# Patient Record
Sex: Female | Born: 1950 | Race: White | Hispanic: No | Marital: Married | Smoking: Former smoker
Health system: Southern US, Community
[De-identification: ages and names within clinical notes are randomized; demographics above are authoritative.]

## PROBLEM LIST (undated history)

## (undated) DIAGNOSIS — Z95810 Presence of automatic (implantable) cardiac defibrillator: Secondary | ICD-10-CM

## (undated) DIAGNOSIS — I714 Abdominal aortic aneurysm, without rupture, unspecified: Secondary | ICD-10-CM

## (undated) DIAGNOSIS — H9190 Unspecified hearing loss, unspecified ear: Secondary | ICD-10-CM

## (undated) DIAGNOSIS — R42 Dizziness and giddiness: Secondary | ICD-10-CM

## (undated) DIAGNOSIS — F0390 Unspecified dementia without behavioral disturbance: Secondary | ICD-10-CM

## (undated) DIAGNOSIS — M1990 Unspecified osteoarthritis, unspecified site: Secondary | ICD-10-CM

## (undated) DIAGNOSIS — I5020 Unspecified systolic (congestive) heart failure: Secondary | ICD-10-CM

## (undated) DIAGNOSIS — I209 Angina pectoris, unspecified: Secondary | ICD-10-CM

## (undated) DIAGNOSIS — R002 Palpitations: Secondary | ICD-10-CM

## (undated) DIAGNOSIS — IMO0001 Reserved for inherently not codable concepts without codable children: Secondary | ICD-10-CM

## (undated) DIAGNOSIS — I428 Other cardiomyopathies: Secondary | ICD-10-CM

## (undated) DIAGNOSIS — I499 Cardiac arrhythmia, unspecified: Secondary | ICD-10-CM

## (undated) DIAGNOSIS — T148XXA Other injury of unspecified body region, initial encounter: Secondary | ICD-10-CM

## (undated) DIAGNOSIS — F419 Anxiety disorder, unspecified: Secondary | ICD-10-CM

## (undated) DIAGNOSIS — R062 Wheezing: Secondary | ICD-10-CM

## (undated) DIAGNOSIS — Z9981 Dependence on supplemental oxygen: Secondary | ICD-10-CM

## (undated) DIAGNOSIS — E039 Hypothyroidism, unspecified: Secondary | ICD-10-CM

## (undated) DIAGNOSIS — F329 Major depressive disorder, single episode, unspecified: Secondary | ICD-10-CM

## (undated) DIAGNOSIS — G4730 Sleep apnea, unspecified: Secondary | ICD-10-CM

## (undated) DIAGNOSIS — I10 Essential (primary) hypertension: Secondary | ICD-10-CM

## (undated) DIAGNOSIS — K219 Gastro-esophageal reflux disease without esophagitis: Secondary | ICD-10-CM

## (undated) DIAGNOSIS — J449 Chronic obstructive pulmonary disease, unspecified: Secondary | ICD-10-CM

## (undated) DIAGNOSIS — F32A Depression, unspecified: Secondary | ICD-10-CM

## (undated) HISTORY — PX: WRIST SURGERY: SHX841

## (undated) HISTORY — PX: CARDIAC CATHETERIZATION: SHX172

## (undated) HISTORY — PX: KNEE SURGERY: SHX244

## (undated) HISTORY — PX: COLON SURGERY: SHX602

## (undated) HISTORY — DX: Unspecified hearing loss, unspecified ear: H91.90

## (undated) HISTORY — PX: BLADDER SURGERY: SHX569

## (undated) HISTORY — DX: Essential (primary) hypertension: I10

## (undated) HISTORY — DX: Presence of automatic (implantable) cardiac defibrillator: Z95.810

## (undated) HISTORY — DX: Unspecified dementia, unspecified severity, without behavioral disturbance, psychotic disturbance, mood disturbance, and anxiety: F03.90

## (undated) HISTORY — PX: COLONOSCOPY: SHX174

## (undated) HISTORY — PX: ABDOMINAL HYSTERECTOMY: SHX81

## (undated) HISTORY — DX: Abdominal aortic aneurysm, without rupture: I71.4

## (undated) HISTORY — DX: Chronic obstructive pulmonary disease, unspecified: J44.9

## (undated) HISTORY — PX: CHOLECYSTECTOMY: SHX55

## (undated) HISTORY — DX: Abdominal aortic aneurysm, without rupture, unspecified: I71.40

## (undated) HISTORY — PX: CARDIAC DEFIBRILLATOR PLACEMENT: SHX171

## (undated) HISTORY — DX: Other cardiomyopathies: I42.8

## (undated) HISTORY — DX: Unspecified systolic (congestive) heart failure: I50.20

## (undated) HISTORY — PX: MIDDLE EAR SURGERY: SHX713

---

## 1998-10-25 ENCOUNTER — Other Ambulatory Visit: Admission: RE | Admit: 1998-10-25 | Discharge: 1998-10-25 | Payer: Self-pay | Admitting: *Deleted

## 1999-07-05 ENCOUNTER — Encounter: Payer: Self-pay | Admitting: *Deleted

## 1999-07-05 ENCOUNTER — Encounter: Admission: RE | Admit: 1999-07-05 | Discharge: 1999-07-05 | Payer: Self-pay | Admitting: *Deleted

## 1999-09-28 ENCOUNTER — Encounter: Payer: Self-pay | Admitting: Urology

## 1999-10-03 ENCOUNTER — Observation Stay (HOSPITAL_COMMUNITY): Admission: RE | Admit: 1999-10-03 | Discharge: 1999-10-04 | Payer: Self-pay | Admitting: Urology

## 2000-03-21 ENCOUNTER — Ambulatory Visit (HOSPITAL_COMMUNITY): Admission: RE | Admit: 2000-03-21 | Discharge: 2000-03-21 | Payer: Self-pay | Admitting: Specialist

## 2000-03-21 ENCOUNTER — Encounter: Payer: Self-pay | Admitting: Specialist

## 2000-03-25 ENCOUNTER — Encounter: Payer: Self-pay | Admitting: Specialist

## 2000-03-25 ENCOUNTER — Ambulatory Visit (HOSPITAL_COMMUNITY): Admission: RE | Admit: 2000-03-25 | Discharge: 2000-03-25 | Payer: Self-pay | Admitting: Specialist

## 2000-12-12 ENCOUNTER — Encounter: Payer: Self-pay | Admitting: Specialist

## 2000-12-12 ENCOUNTER — Encounter: Admission: RE | Admit: 2000-12-12 | Discharge: 2000-12-12 | Payer: Self-pay | Admitting: Specialist

## 2001-03-30 ENCOUNTER — Other Ambulatory Visit: Admission: RE | Admit: 2001-03-30 | Discharge: 2001-03-30 | Payer: Self-pay | Admitting: Obstetrics and Gynecology

## 2001-06-01 ENCOUNTER — Encounter (INDEPENDENT_AMBULATORY_CARE_PROVIDER_SITE_OTHER): Payer: Self-pay

## 2001-06-01 ENCOUNTER — Ambulatory Visit (HOSPITAL_COMMUNITY): Admission: RE | Admit: 2001-06-01 | Discharge: 2001-06-01 | Payer: Self-pay | Admitting: Gastroenterology

## 2001-06-01 ENCOUNTER — Encounter: Payer: Self-pay | Admitting: Gastroenterology

## 2001-06-15 ENCOUNTER — Encounter: Payer: Self-pay | Admitting: Gastroenterology

## 2001-06-15 ENCOUNTER — Ambulatory Visit (HOSPITAL_COMMUNITY): Admission: RE | Admit: 2001-06-15 | Discharge: 2001-06-15 | Payer: Self-pay | Admitting: Gastroenterology

## 2001-10-30 ENCOUNTER — Encounter: Admission: RE | Admit: 2001-10-30 | Discharge: 2001-10-30 | Payer: Self-pay

## 2002-06-08 ENCOUNTER — Other Ambulatory Visit: Admission: RE | Admit: 2002-06-08 | Discharge: 2002-06-08 | Payer: Self-pay | Admitting: Obstetrics & Gynecology

## 2002-07-06 ENCOUNTER — Encounter: Payer: Self-pay | Admitting: Orthopedic Surgery

## 2002-07-06 ENCOUNTER — Ambulatory Visit (HOSPITAL_COMMUNITY): Admission: RE | Admit: 2002-07-06 | Discharge: 2002-07-06 | Payer: Self-pay | Admitting: Orthopedic Surgery

## 2002-08-19 ENCOUNTER — Encounter (INDEPENDENT_AMBULATORY_CARE_PROVIDER_SITE_OTHER): Payer: Self-pay | Admitting: *Deleted

## 2002-08-19 ENCOUNTER — Ambulatory Visit (HOSPITAL_BASED_OUTPATIENT_CLINIC_OR_DEPARTMENT_OTHER): Admission: RE | Admit: 2002-08-19 | Discharge: 2002-08-19 | Payer: Self-pay | Admitting: Orthopedic Surgery

## 2003-03-29 ENCOUNTER — Encounter: Admission: RE | Admit: 2003-03-29 | Discharge: 2003-03-29 | Payer: Self-pay | Admitting: Family Medicine

## 2003-05-19 ENCOUNTER — Ambulatory Visit (HOSPITAL_BASED_OUTPATIENT_CLINIC_OR_DEPARTMENT_OTHER): Admission: RE | Admit: 2003-05-19 | Discharge: 2003-05-19 | Payer: Self-pay | Admitting: Orthopedic Surgery

## 2003-05-19 ENCOUNTER — Ambulatory Visit (HOSPITAL_COMMUNITY): Admission: RE | Admit: 2003-05-19 | Discharge: 2003-05-19 | Payer: Self-pay | Admitting: Orthopedic Surgery

## 2003-05-19 ENCOUNTER — Encounter (INDEPENDENT_AMBULATORY_CARE_PROVIDER_SITE_OTHER): Payer: Self-pay | Admitting: *Deleted

## 2005-03-08 ENCOUNTER — Ambulatory Visit: Payer: Self-pay | Admitting: Internal Medicine

## 2005-03-08 ENCOUNTER — Inpatient Hospital Stay (HOSPITAL_COMMUNITY): Admission: EM | Admit: 2005-03-08 | Discharge: 2005-03-15 | Payer: Self-pay | Admitting: Emergency Medicine

## 2005-03-08 ENCOUNTER — Ambulatory Visit: Payer: Self-pay | Admitting: Cardiovascular Disease

## 2005-03-12 ENCOUNTER — Encounter: Payer: Self-pay | Admitting: Internal Medicine

## 2005-04-25 ENCOUNTER — Emergency Department (HOSPITAL_COMMUNITY): Admission: EM | Admit: 2005-04-25 | Discharge: 2005-04-26 | Payer: Self-pay | Admitting: Emergency Medicine

## 2006-02-01 ENCOUNTER — Emergency Department: Payer: Self-pay | Admitting: Emergency Medicine

## 2006-02-12 ENCOUNTER — Ambulatory Visit: Payer: Self-pay | Admitting: Unknown Physician Specialty

## 2006-03-03 ENCOUNTER — Encounter: Admission: RE | Admit: 2006-03-03 | Discharge: 2006-03-03 | Payer: Self-pay | Admitting: Family Medicine

## 2007-05-26 ENCOUNTER — Ambulatory Visit: Payer: Self-pay | Admitting: Gastroenterology

## 2007-06-09 ENCOUNTER — Ambulatory Visit: Payer: Self-pay | Admitting: Gastroenterology

## 2007-06-09 ENCOUNTER — Encounter: Payer: Self-pay | Admitting: Gastroenterology

## 2008-11-24 ENCOUNTER — Inpatient Hospital Stay: Payer: Self-pay | Admitting: Student

## 2008-12-07 LAB — CONVERTED CEMR LAB
AST: 32 units/L
CO2: 22 meq/L
Calcium: 9.3 mg/dL
Chloride: 96 meq/L
Creatinine, Ser: 1.5 mg/dL
Sodium: 135 meq/L

## 2009-03-09 ENCOUNTER — Encounter: Payer: Self-pay | Admitting: Cardiovascular Disease

## 2009-03-09 LAB — CONVERTED CEMR LAB
BUN: 14 mg/dL
CO2: 27 meq/L
Chloride: 99 meq/L
Creatinine, Ser: 0.82 mg/dL
Potassium: 4.2 meq/L

## 2009-03-23 ENCOUNTER — Encounter: Payer: Self-pay | Admitting: Cardiovascular Disease

## 2009-03-23 ENCOUNTER — Ambulatory Visit: Payer: Self-pay | Admitting: Internal Medicine

## 2009-03-23 DIAGNOSIS — I5022 Chronic systolic (congestive) heart failure: Secondary | ICD-10-CM | POA: Insufficient documentation

## 2009-03-23 DIAGNOSIS — R002 Palpitations: Secondary | ICD-10-CM

## 2009-03-23 DIAGNOSIS — I5042 Chronic combined systolic (congestive) and diastolic (congestive) heart failure: Secondary | ICD-10-CM

## 2009-03-23 DIAGNOSIS — I420 Dilated cardiomyopathy: Secondary | ICD-10-CM | POA: Insufficient documentation

## 2009-03-23 DIAGNOSIS — R05 Cough: Secondary | ICD-10-CM

## 2009-03-23 DIAGNOSIS — R011 Cardiac murmur, unspecified: Secondary | ICD-10-CM

## 2009-03-23 DIAGNOSIS — G4733 Obstructive sleep apnea (adult) (pediatric): Secondary | ICD-10-CM

## 2009-03-24 DIAGNOSIS — I10 Essential (primary) hypertension: Secondary | ICD-10-CM | POA: Insufficient documentation

## 2009-04-11 ENCOUNTER — Telehealth: Payer: Self-pay | Admitting: Internal Medicine

## 2009-04-12 ENCOUNTER — Ambulatory Visit: Payer: Self-pay | Admitting: Internal Medicine

## 2009-04-12 ENCOUNTER — Inpatient Hospital Stay (HOSPITAL_COMMUNITY): Admission: RE | Admit: 2009-04-12 | Discharge: 2009-04-13 | Payer: Self-pay | Admitting: Internal Medicine

## 2009-04-13 ENCOUNTER — Encounter: Payer: Self-pay | Admitting: Internal Medicine

## 2009-04-15 ENCOUNTER — Inpatient Hospital Stay (HOSPITAL_COMMUNITY)
Admission: EM | Admit: 2009-04-15 | Discharge: 2009-04-17 | Payer: Self-pay | Source: Home / Self Care | Admitting: Emergency Medicine

## 2009-04-15 ENCOUNTER — Telehealth (INDEPENDENT_AMBULATORY_CARE_PROVIDER_SITE_OTHER): Payer: Self-pay | Admitting: Physician Assistant

## 2009-04-15 ENCOUNTER — Ambulatory Visit: Payer: Self-pay | Admitting: Internal Medicine

## 2009-04-18 ENCOUNTER — Encounter: Payer: Self-pay | Admitting: Internal Medicine

## 2009-04-20 ENCOUNTER — Ambulatory Visit: Payer: Self-pay | Admitting: Cardiovascular Disease

## 2009-04-27 ENCOUNTER — Ambulatory Visit: Payer: Self-pay

## 2009-04-27 ENCOUNTER — Encounter: Payer: Self-pay | Admitting: Internal Medicine

## 2009-05-19 ENCOUNTER — Emergency Department: Payer: Self-pay | Admitting: Emergency Medicine

## 2009-06-12 ENCOUNTER — Telehealth: Payer: Self-pay | Admitting: Nurse Practitioner

## 2009-06-13 ENCOUNTER — Ambulatory Visit (HOSPITAL_COMMUNITY): Admission: RE | Admit: 2009-06-13 | Discharge: 2009-06-13 | Payer: Self-pay | Admitting: Internal Medicine

## 2009-06-13 ENCOUNTER — Ambulatory Visit: Payer: Self-pay | Admitting: Internal Medicine

## 2009-07-18 ENCOUNTER — Encounter: Payer: Self-pay | Admitting: Cardiovascular Disease

## 2009-07-18 ENCOUNTER — Ambulatory Visit: Payer: Self-pay | Admitting: Internal Medicine

## 2009-07-20 LAB — CONVERTED CEMR LAB
BUN: 15 mg/dL (ref 6–23)
CO2: 26 meq/L (ref 19–32)
Calcium: 9.4 mg/dL (ref 8.4–10.5)
Creatinine, Ser: 0.9 mg/dL (ref 0.40–1.20)
Glucose, Bld: 134 mg/dL — ABNORMAL HIGH (ref 70–99)
Pro B Natriuretic peptide (BNP): 4.6 pg/mL (ref 0.0–100.0)

## 2009-07-21 ENCOUNTER — Telehealth: Payer: Self-pay | Admitting: Internal Medicine

## 2009-08-16 ENCOUNTER — Inpatient Hospital Stay: Payer: Self-pay | Admitting: Internal Medicine

## 2009-08-30 ENCOUNTER — Ambulatory Visit: Payer: Self-pay | Admitting: Cardiovascular Disease

## 2009-09-05 ENCOUNTER — Emergency Department: Payer: Self-pay | Admitting: Emergency Medicine

## 2009-09-21 ENCOUNTER — Ambulatory Visit: Payer: Self-pay | Admitting: Internal Medicine

## 2009-09-22 ENCOUNTER — Ambulatory Visit: Payer: Self-pay | Admitting: Internal Medicine

## 2009-10-18 ENCOUNTER — Encounter: Payer: Self-pay | Admitting: Internal Medicine

## 2009-10-19 ENCOUNTER — Ambulatory Visit: Payer: Self-pay | Admitting: Internal Medicine

## 2009-11-16 ENCOUNTER — Ambulatory Visit: Payer: Self-pay

## 2009-11-16 ENCOUNTER — Encounter: Payer: Self-pay | Admitting: Internal Medicine

## 2009-11-29 ENCOUNTER — Emergency Department: Payer: Self-pay | Admitting: Emergency Medicine

## 2010-02-15 ENCOUNTER — Encounter: Payer: Self-pay | Admitting: Internal Medicine

## 2010-02-15 ENCOUNTER — Ambulatory Visit
Admission: RE | Admit: 2010-02-15 | Discharge: 2010-02-15 | Payer: Self-pay | Source: Home / Self Care | Attending: Internal Medicine | Admitting: Internal Medicine

## 2010-02-25 ENCOUNTER — Encounter: Payer: Self-pay | Admitting: Unknown Physician Specialty

## 2010-02-25 ENCOUNTER — Encounter: Payer: Self-pay | Admitting: Family Medicine

## 2010-03-06 NOTE — Progress Notes (Signed)
  Phone Note Call from Patient   Call For: Dr Sherryl Manges Action Taken: Patient advised to go to ER Summary of Call: Pt c/o swelling at ICD site. New, since this am. Some pain and a tingling feeling as well but does not think device has gone off. Advised her to come to the [facility] ER and get checked.  Initial call taken by: Park Breed PA-C,  April 15, 2009 7:40 PM

## 2010-03-06 NOTE — Procedures (Signed)
Summary: device ck/mt   Current Medications (verified): 1)  Aspirin 81 Mg Tbec (Aspirin) .... Take One Tablet By Mouth Daily 2)  Levothroid 100 Mcg Tabs (Levothyroxine Sodium) .Marland Kitchen.. 1 Tab By Mouth Once Daily 3)  Losartan Potassium 50 Mg Tabs (Losartan Potassium) .Marland Kitchen.. 1 Tab By Mouth Daily 4)  Spironolactone 25 Mg Tabs (Spironolactone) .... 1/2 Tab By Mouth Once Daily 5)  Hydrocodone-Acetaminophen 5-500 Mg Tabs (Hydrocodone-Acetaminophen) .Marland Kitchen.. 1 By Mouth Two Times A Day 6)  Coreg 25 Mg Tabs (Carvedilol) .... 1/2 By Mouth Am and 1 By Mouth Pm 7)  Spiriva Handihaler 18 Mcg Caps (Tiotropium Bromide Monohydrate) .... One Puff Daily 8)  Digoxin 0.125 Mg Tabs (Digoxin) .... One By Mouth Daily 9)  Symbicort 160-4.5 Mcg/act Aero (Budesonide-Formoterol Fumarate) .... One Puff Daily 10)  Lasix 40 Mg Tabs (Furosemide) .... One By Mouth Two Times A Day 11)  Ambien 10 Mg Tabs (Zolpidem Tartrate) .... 1/2 By Mouth At Bedtime 12)  Pantoprazole Sodium 40 Mg Tbec (Pantoprazole Sodium) .... One By Mouth Daily 13)  Mirapex 0.25 Mg Tabs (Pramipexole Dihydrochloride) .... One By Mouth Daily At Bedtime 14)  Duoneb 0.5-2.5 (3) Mg/1ml Soln (Ipratropium-Albuterol) .... Every 4 Hours As Needed 15)  Proventil Hfa 108 (90 Base) Mcg/act Aers (Albuterol Sulfate) .... 2 Puffs As Needed Every 4 Hours 16)  Alprazolam 0.25 Mg Tabs (Alprazolam) .... Take As Needed 17)  Metoclopramide Hcl 5 Mg Tabs (Metoclopramide Hcl) .... Take 1 Tablet By Mouth Once Daily 18)  Venlafaxine Hcl 37.5 Mg Tabs (Venlafaxine Hcl) .... Take 1 Tablet By Mouth Once Daily  Allergies (verified): No Known Drug Allergies   ICD Specifications Following MD:  Sherryl Manges, MD     ICD Vendor:  [employer] Jude     ICD Model Number:  [phone]     ICD Serial Number:  [id number] ICD DOI:  04/12/2009     ICD Implanting MD:  Sherryl Manges, MD  Lead 1:    Location: RA     DOI: 04/12/2009     Model #: [id number]     Serial #: [id number]     Status: active Lead 2:    Location: RV      DOI: 04/12/2009     Model #: 7121     Serial #: [id number]     Status: active Lead 3:    Location: LV     DOI: 04/12/2009     Model #: 1258T     Serial #: [id number]     Status: active  Indications::  CM   ICD Follow Up Battery Voltage:  89% V     Charge Time:  8.5 seconds     Battery Est. Longevity:  6.0 yrs Underlying rhythm:  SR ICD Dependent:  No       ICD Device Measurements Atrium:  Amplitude: 5.0 mV, Impedance: 400 ohms, Threshold: 1.0 V at 0.5 msec Right Ventricle:  Impedance: 540 ohms,  Left Ventricle:  Impedance: 580 ohms,  Configuration: LV RING TO RV COIL Shock Impedance: 48 ohms   Episodes MS Episodes:  2     Percent Mode Switch:  <1%     Shock:  0     ATP:  0     Nonsustained:  0     Atrial Therapies:  0 Atrial Pacing:  5.4%     Ventricular Pacing:  >99%  Brady Parameters Mode DDD     Lower Rate Limit:  60     Upper Rate Limit 120 PAV  190     Sensed AV Delay:  140  Tachy Zones VF:  240     VT:  200     Next Remote Date:  02/15/2010     Next Cardiology Appt Due:  07/06/2010 Tech Comments:  PT IN FOR FOLLOWUP FROM [facility]---CHANGED RA SENSITIVITY FROM AUTO TO 0.52mV DUE TO INCREASED MODE SWITCHES ON [facility] TRANSMISSIION.  NEXT [facility] TRANSMISSION 02-15-10. ROV IN [facility] W/SK. Vella Kohler  November 17, 2009 12:06 PM   Patient Instructions: 1)  [facility] transmission on 02-15-2010

## 2010-03-06 NOTE — Progress Notes (Signed)
Summary: LAB RESULTS  Phone Note Call from Patient Call back at [phone]   Caller: HUSBAND (DALE) Call For: [facility] Summary of Call: WOULD LIKE RECENT LAB RESULTS Initial call taken by: Harlon Flor,  July 21, 2009 10:02 AM  Follow-up for Phone Call        called and spoke to pt 6/16 husband said that she did not remember what i had told her yesterday.  so went through the recommendations with him also. Follow-up by: Benedict Needy, RN,  July 21, 2009 2:15 PM

## 2010-03-06 NOTE — Assessment & Plan Note (Signed)
Summary: NEP/AMD   Visit Type:  New Patient EP Referring Provider:  Dr Kirke Corin Primary Provider:  Lonie Peak, MD   History of Present Illness: Stacy Grimes is seen at the request of Dr. Kirke Corin for consideration of ICD implantation.  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 She was found to have an ejection fraction of 10-15% and catheterization demonstrated no significant obstructive disease. She has been treated for the last number of months with beta blockers ACE inhibitors and Aldactone with more recent assessment of her ejection fraction last 2 weeks of 23%. She continues to have significant symptoms. She is short of breath at less than 100 yards. She cannot climb stairs. She has some nocturnal dyspnea with orthopnea. She has mild peripheral edema.  She has had no syncope; she has had tachycardia palpitations as well as irregularities that sound like simple skips  She also has a problem with a cough that is mostly nonproductive.  Preventive Screening-Counseling & Management  Alcohol-Tobacco     Alcohol drinks/day: 0     Smoking Status: quit  Caffeine-Diet-Exercise     Caffeine use/day: no     Does Patient Exercise: no  Current Medications (verified): 1)  Prilosec Otc 20 Mg Tbec (Omeprazole Magnesium) .Marland Kitchen.. 1 By Mouth Once Daily 2)  Advair Diskus 250-50 Mcg/dose Aepb (Fluticasone-Salmeterol) .... Two Times A Day 3)  Aspirin 81 Mg Tbec (Aspirin) .... Take One Tablet By Mouth Daily 4)  Carvedilol 6.25 Mg Tabs (Carvedilol) .... Take One Tablet By Mouth Twice A Day 5)  Citalopram Hydrobromide 40 Mg Tabs (Citalopram Hydrobromide) .Marland Kitchen.. 1 Tab By Mouth Once Daily 6)  Furosemide 40 Mg Tabs (Furosemide) .Marland Kitchen.. 1 Tab By Mouth Two Times A Day 7)  Levothroid 125 Mcg Tabs (Levothyroxine Sodium) .Marland Kitchen.. 1 Tab By Mouth Once Daily 8)  Lisinopril 10 Mg Tabs (Lisinopril) .... Take 1/2 Tablet By Mouth Daily 9)   Spironolactone 25 Mg Tabs (Spironolactone) .... 1/2 Tab By Mouth Once Daily 10)  Trazodone Hcl 50 Mg Tabs (Trazodone Hcl) .Marland Kitchen.. 1 Tab By Mouth Once Daily At Bedtime 11)  Pramipexole Dihydrochloride 0.125 Mg Tabs (Pramipexole Dihydrochloride) .Marland Kitchen.. 1 By Mouth Once Daily 12)  Hydrocodone-Acetaminophen 5-500 Mg Tabs (Hydrocodone-Acetaminophen) .Marland Kitchen.. 1 By Mouth Two Times A Day 13)  Ipratropium-Albuterol 0.5-2.5 (3) Mg/74ml Soln (Ipratropium-Albuterol) .... As Needed  Allergies (verified): No Known Drug Allergies  Past History:  Family History: Last updated: 2009/03/26 Mother: deceased: HTN, STROKE, Breast cancer. Father: deceased: heart disease Sister (both) heart attach Brother: HTN  Social History: Last updated: 03/13/2009 Tobacco Use - Former. Quit 7 years ago. Alcohol Use - no  Risk Factors: Alcohol Use: 0 (03/26/09) Caffeine Use: no (March 26, 2009) Exercise: no (Mar 26, 2009)  Risk Factors: Smoking Status: quit (2009-03-26)  Past Medical History: No coronary artery disease cardiac cath 10/10: only mid CAD. EF 15%. Cardiomyopathy non-ischemic Dilated Cardiomyopathy. CHF Systolic congestive heart failure. Chronic systolic congestive heart failure. Essential hypertension. 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 Father: deceased: heart disease Sister (both) heart attach Brother: HTN  Social History: Alcohol drinks/day:  0 Caffeine use/day:  no Does Patient Exercise:  no  Review of Systems       full review of systems was negative apart from a history of present illness and past medical history.  Vital Signs:  Patient profile:   60 year old female Height:      63 inches Weight:      216.50 pounds BMI:     38.49 Pulse rate:   77 / minute Pulse rhythm:   regular BP sitting:   100 / 60  (right  arm) Cuff size:   large  Vitals Entered By: Mercer Pod (March 23, 2009 1:42 PM)  Physical Exam  General:  Alert and oriented older Caucasian female appearing considerably older than her stated age of 60 in mild respiratory distress ; she is wearing oxygen HEENT  normal . Neck veins were 8-9 centimeterscarotids brisk and full without bruits. No lymphadenopathy. Back without kyphosis. Lungs clear. Heart sounds regular without murmurs or gallops. PMI nondisplaced. Abdomen soft and protuberantwith active bowel sounds without midline pulsation or hepatomegaly. Femoral pulses and distal pulses intact. Extremities were without clubbing cyanosis or edemaSkin warm and dry. Neurological exam grossly normal; affect is sad     Problems:  Medical Problems Added: 1)  Dx of Murmur  (ICD-785.2) 2)  Dx of Hypertension, Benign  (ICD-401.1) 3)  Dx of Cough  (ICD-786.2) 4)  Dx of Palpitations  (ICD-785.1) 5)  Dx of Sleep Apnea, Obstructive  (ICD-327.23) 6)  Dx of Systolic Heart Failure, Chronic  (ICD-428.22) 7)  Dx of Cardiomyopathy, Primary, Dilated  (ICD-425.4)  EKG  Procedure date:  03/23/2009  Findings:      Sinus rhythm at 77 Intervals 0.17/0.17/0.44 Axis is 60 Left bundle-branch block  Echocardiogram  Procedure date:  03/09/2009  Findings:      Mitral regurgitation Mildly dilated left atrium and moderately dilated LV with rest of the chambers of normal size.  Severe LV systolic dysfunction EF 20-25%. Global hypokinesis. Mild left ventrucular hypertrophy Mild tricuspid regurgitation. Normal pulmonary artery systolic pressure. Mild mitral regurgitation.   Impression & Recommendations:  Problem # 1:  SYSTOLIC HEART FAILURE, CHRONIC (ICD-428.22) the patient has persistent symptoms of class III heart failure despite maximal medical therapy which her blood pressure will allow. Cardiac resynchronization therapy is indicated with persistent symptoms now greater than 3 months.   we have discussed the super benefit noted in women with nonischemic cardiac myopathy. We did discuss potential risks of the procedure including but not limited to death perforation lead dislodgment infection and device malfunction.  In addition, with her LV dysfunction is appropriate to consider ICD implantation at the same time. We have discussed extensively the risk of cardiac death Her updated medication list for this problem includes:    Aspirin 81 Mg Tbec (Aspirin) .Marland Kitchen... Take one tablet by mouth daily    Carvedilol 6.25 Mg Tabs (Carvedilol) .Marland Kitchen... Take one tablet by mouth twice a day    Furosemide 40 Mg Tabs (Furosemide) .Marland Kitchen... 1 tab by mouth two times a day    Losartan Potassium 50 Mg Tabs (Losartan potassium) .Marland Kitchen... 1 tab by mouth daily    Spironolactone 25 Mg Tabs (Spironolactone) .Marland Kitchen... 1/2 tab by mouth once daily  Problem # 2:  PALPITATIONS (ICD-785.1) Mechanism her palpitations is not clear and ultimately will become evidentvia  her device Her updated medication list for this problem includes:    Aspirin 81 Mg Tbec (Aspirin) .Marland Kitchen... Take one tablet by mouth daily    Carvedilol 6.25 Mg Tabs (Carvedilol) .Marland Kitchen... Take one tablet by mouth twice a day  Problem # 3:  CARDIOMYOPATHY, PRIMARY, DILATED (ICD-425.4) as above Her updated medication list for this problem includes:    Aspirin 81 Mg Tbec (  Aspirin) .Marland Kitchen... Take one tablet by mouth daily    Carvedilol 6.25 Mg Tabs (Carvedilol) .Marland Kitchen... Take one tablet by mouth twice a day    Furosemide 40 Mg Tabs (Furosemide) .Marland Kitchen... 1 tab by mouth two times a day    Losartan Potassium 50 Mg Tabs (Losartan potassium) .Marland Kitchen... 1 tab by mouth daily    Spironolactone 25 Mg Tabs (Spironolactone) .Marland Kitchen... 1/2 tab by mouth once daily  Problem # 4:  COUGH (ICD-786.2) Her cough may be related to her ACE inhibitor.we'll plan to discontinue it and put her on losartan 50 mg Her updated medication list for this problem includes:    Aspirin 81 Mg Tbec (Aspirin) .Marland Kitchen... Take one  tablet by mouth daily    Carvedilol 6.25 Mg Tabs (Carvedilol) .Marland Kitchen... Take one tablet by mouth twice a day  Patient Instructions: 1)  Your physician has recommended you make the following change in your medication: stop lisinopril. start losartan 50 mg daily 2)  Your physician has recommended that you have a defibrillator inserted.  An implantable cardioverter defibrillator (ICD) is a small device that is placed in your chest or, in rare cases, your abdomen. This device uses electrical pulses or shocks to help control life-threatening, irregular heartbeats that could lead the heart to suddenly stop beating (sudden cardiac arrest). Leads are attached to the ICD that goes into your heart. This is done in the hospital and usually requires an overnight stay. Please see the instruction sheet given to you today for more information. Prescriptions: LOSARTAN POTASSIUM 50 MG TABS (LOSARTAN POTASSIUM) 1 tab by mouth daily  #30 x 6   Entered by:   Charlena Cross, RN, BSN   Authorized by:   Nathen May, MD, [facility]   Signed by:   Mercer Pod on 03/24/2009   Method used:   Print then Give to Patient   RxID:   [id number]    -  Date:  03/09/2009    BG Random: 88    BUN: 14    Creatinine: 0.82    Sodium: 139    Potassium: 4.2    Chloride: 99    CO2 Total: 27    Calcium: 9.0    BNP: 54.7  Date:  12/07/2008    BG Random: 94    BUN: 24    Creatinine: 1.5    Sodium: 135    Potassium: 4.6    Chloride: 96    CO2 Total: 22    SGOT (AST): 32    SGPT (ALT): 47    T. Bilirubin: 0.6    Calcium: 9.3    BNP: 101.9  Appended Document: NEP/AMD    Clinical Lists Changes  Orders: Added new Referral order of Bi-V ICD (Bi-V ICD) - Signed

## 2010-03-06 NOTE — Assessment & Plan Note (Signed)
Summary: F3M/AMD   Visit Type:  Follow-up  CC:  no cardiac complaints.  Allergies (verified): No Known Drug Allergies  Vital Signs:  Patient profile:   60 year old female Height:      63 inches Weight:      220 pounds BMI:     39.11  Vitals Entered By: Bishop Dublin, CMA (July 18, 2009 1:24 PM)    ICD Specifications Following MD:  Sherryl Manges, MD     ICD Vendor:  [employer] Jude     ICD Model Number:  [id number]     ICD Serial Number:  [id number] ICD DOI:  04/12/2009     ICD Implanting MD:  Sherryl Manges, MD  Lead 1:    Location: RA     DOI: 04/12/2009     Model #: [id number]     Serial #: [id number]     Status: active Lead 2:    Location: RV     DOI: 04/12/2009     Model #: 7121     Serial #: [id number]     Status: active Lead 3:    Location: LV     DOI: 04/12/2009     Model #: 1258T     Serial #: [id number]     Status: active  Indications::  CM   ICD Follow Up ICD Dependent:  No       ICD Device Measurements Configuration: LV RING TO RV COIL  Brady Parameters Mode DDD     Lower Rate Limit:  60     Upper Rate Limit 120 PAV 150     Sensed AV Delay:  120  Tachy Zones VF:  240     VT:  200

## 2010-03-06 NOTE — Procedures (Signed)
Summary: Cardiology Device Clinic   Allergies: No Known Drug Allergies    ICD Specifications Following MD:  Sherryl Manges, MD     ICD Vendor:  [employer] Jude     ICD Model Number:  [id number]     ICD Serial Number:  [id number] ICD DOI:  04/12/2009     ICD Implanting MD:  Sherryl Manges, MD  Lead 1:    Location: RA     DOI: 04/12/2009     Model #: [id number]     Serial #: [id number]     Status: active Lead 2:    Location: RV     DOI: 04/12/2009     Model #: 0865     Serial #: [id number]     Status: active Lead 3:    Location: LV     DOI: 04/12/2009     Model #: 1258T     Serial #: [id number]     Status: active  Indications::  CM  Tech Comments:  Patient seen in follow up for hematoma.  Pt with moderate hematoma.  No bruising or redness.  Patient very tender to the touch.  Advised to keep clean and dry and call back with increasing swelling, drainage, or redness.  Patient advised to take pain meds. Pt has follow up with Dr Mariah Milling 3-17 and device clinic 3-24. Gypsy Balsam RN BSN  April 18, 2009 10:00 AM

## 2010-03-06 NOTE — Assessment & Plan Note (Signed)
Summary: NP6/BMET/AMD   Visit Type:  post hospital  Referring Provider:  Dr Kirke Corin Primary Provider:  Lonie Peak, MD  CC:  pacemaker pocker still swollen, some chest pains - sometimes feels like a shock, and no edema or shortness of breath.  History of Present Illness: [name] is a 60 year old woman with nonischemic cardiomyopathy, no significant coronary disease by catheterization in October 2010, ejection fraction 15-20%, with defibrillator placed April 13, 2009, also a long history of COPD and long smoking history presents for evaluation of her pacer pocket site.  she states that over the past week, her pacer pocket site has been very tender. She has been taking pain medications for comfort. She has a little bit of soreness upper neck and a little bit in her left hand and wrist. She denies any significant change to her shortness of breath which is chronic, no cell or significant chest pain other than the discomfort around the pocket site.  She denies any worsening lower extremity edema, she does drink a significant amount of fluids and her husband states that she drinks a gallon a day.  Current Problems (verified): 1)  Murmur  (ICD-785.2) 2)  Hypertension, Benign  (ICD-401.1) 3)  Cough  (ICD-786.2) 4)  Palpitations  (ICD-785.1) 5)  Sleep Apnea, Obstructive  (ICD-327.23) 6)  Systolic Heart Failure, Chronic  (ICD-428.22) 7)  Cardiomyopathy, Primary, Dilated  (ICD-425.4)  Current Medications (verified): 1)  Advair Diskus 250-50 Mcg/dose Aepb (Fluticasone-Salmeterol) .... Two Times A Day 2)  Aspirin 81 Mg Tbec (Aspirin) .... Take One Tablet By Mouth Daily 3)  Carvedilol 12.5 Mg Tabs (Carvedilol) .... Take One Tablet By Mouth Twice A Day 4)  Citalopram Hydrobromide 40 Mg Tabs (Citalopram Hydrobromide) .Marland Kitchen.. 1 Tab By Mouth Once Daily 5)  Furosemide 40 Mg Tabs (Furosemide) .Marland Kitchen.. 1 Tab By Mouth Two Times A Day 6)  Levothroid 125 Mcg Tabs (Levothyroxine Sodium) .Marland Kitchen.. 1 Tab By Mouth Once  Daily 7)  Losartan Potassium 50 Mg Tabs (Losartan Potassium) .Marland Kitchen.. 1 Tab By Mouth Daily 8)  Spironolactone 25 Mg Tabs (Spironolactone) .... 1/2 Tab By Mouth Once Daily 9)  Trazodone Hcl 50 Mg Tabs (Trazodone Hcl) .... 2 Tab By Mouth Once Daily At Bedtime 10)  Pramipexole Dihydrochloride 0.125 Mg Tabs (Pramipexole Dihydrochloride) .Marland Kitchen.. 1 By Mouth Once Daily 11)  Hydrocodone-Acetaminophen 5-500 Mg Tabs (Hydrocodone-Acetaminophen) .Marland Kitchen.. 1 By Mouth Two Times A Day 12)  Ipratropium-Albuterol 0.5-2.5 (3) Mg/30ml Soln (Ipratropium-Albuterol) .... As Needed  Allergies (verified): No Known Drug Allergies  Past History:  Past Medical History: Last updated: 03-26-09 No coronary artery disease cardiac cath 10/10: only mid CAD. EF 15%. Cardiomyopathy non-ischemic Dilated Cardiomyopathy. CHF Systolic congestive heart failure. Chronic systolic congestive heart failure. Essential hypertension. 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 Father: deceased: heart disease Sister (both) heart attach Brother: HTN  Social History: Last updated: 03/13/2009 Tobacco Use - Former. Quit 7 years ago. Alcohol Use - no  Risk Factors: Alcohol Use: 0 (03-26-09) Caffeine Use: no (March 26, 2009) Exercise: no (03/26/2009)  Risk Factors: Smoking Status: quit (2009-03-26)  Review of Systems  The patient denies fever, weight loss, weight gain, vision loss, decreased hearing, hoarseness, chest pain, syncope, dyspnea on exertion, peripheral edema, prolonged cough, abdominal pain, incontinence, muscle weakness, depression, and enlarged lymph nodes.         Discomfort  around pacer site, swelling, no discharge  Vital Signs:  Patient profile:   60 year old female Height:      63  inches Weight:      222.50 pounds BMI:     39.56 Pulse rate:   60 / minute Pulse rhythm:   regular BP sitting:   126 / 76  (left arm) Cuff size:   large  Vitals Entered By: Mercer Pod (April 20, 2009 3:40 PM)  Physical Exam  General:  middle-aged woman in no apparent distress, HEENT exam is benign, neck is supple with no JVP or carotid bruits, heart sounds are regular with S1-S2 and no murmurs appreciated, lungs are clear to auscultation with no wheezes or rales, abdominal exam is benign, no significant lower extremity edema, neurologic exam is grossly nonfocal, skin is warm and dry.     ICD Specifications Following MD:  Sherryl Manges, MD     ICD Vendor:  [facility] Jude     ICD Model Number:  [id number]     ICD Serial Number:  [id number] ICD DOI:  04/12/2009     ICD Implanting MD:  Sherryl Manges, MD  Lead 1:    Location: RA     DOI: 04/12/2009     Model #: [id number]     Serial #: [id number]     Status: active Lead 2:    Location: RV     DOI: 04/12/2009     Model #: 7121     Serial #: [id number]     Status: active Lead 3:    Location: LV     DOI: 04/12/2009     Model #: 1258T     Serial #: [id number]     Status: active  Indications::  CM   Impression & Recommendations:  Problem # 1:  SYSTOLIC HEART FAILURE, CHRONIC (ICD-428.22)  symptoms of class III heart failure despite maximal medical therapy which her blood pressure will allow. ICD placed 04/10/2009 for Cardiac resynchronization therapy and secondary prevention for arrhythmia.   she is relatively asymptomatic apart from some mild swelling around the pacer pocket site. This is tender to touch though shows no signs of discharge or infection. I encouraged her to continue to keep the site protected in the shower. This should hopefully resolve with time. We have not made any further medication changes though in the future may increase her losartan to 100 mg daily.  She is currently on aspirin daily. she is not showing any signs of heart failure,  lungs are clear, no significant lower extremity edema and no significant shortness of breath. She does tend to accumulate fluid in her abdomen she states that currently it is okay.  Her updated medication list for this problem includes:    Aspirin 81 Mg Tbec (Aspirin) .Marland Kitchen... Take one tablet by mouth daily    Carvedilol 6.25 Mg Tabs (Carvedilol) .Marland Kitchen... Take one tablet by mouth twice a day    Furosemide 40 Mg Tabs (Furosemide) .Marland Kitchen... 1 tab by mouth two times a day    Lisinopril 10 Mg Tabs (Lisinopril) .Marland Kitchen... Take 1/2 tablet by mouth daily    Spironolactone 25 Mg Tabs (Spironolactone) .Marland Kitchen... 1/2 tab by mouth once daily  Problem # 2:  HYPERTENSION, BENIGN (ICD-401.1) Blood pressure is well controlled on her current medication regimen. No changes made.  Her updated medication list for this problem includes:    Aspirin 81 Mg Tbec (Aspirin) .Marland Kitchen... Take one tablet by mouth daily    Carvedilol 12.5 Mg Tabs (  Carvedilol) .Marland Kitchen... Take one tablet by mouth twice a day    Furosemide 40 Mg Tabs (Furosemide) .Marland Kitchen... 1 tab by mouth two times a day    Losartan Potassium 50 Mg Tabs (Losartan potassium) .Marland Kitchen... 1 tab by mouth daily    Spironolactone 25 Mg Tabs (Spironolactone) .Marland Kitchen... 1/2 tab by mouth once daily

## 2010-03-06 NOTE — Cardiovascular Report (Signed)
Summary: Office Visit   Office Visit   Imported By: Roderic Ovens 05/12/2009 13:20:53  _____________________________________________________________________  External Attachment:    Type:   Image     Comment:   External Document

## 2010-03-06 NOTE — Procedures (Signed)
Summary: wound check/jml   Allergies: No Known Drug Allergies   ICD Specifications Following MD:  Sherryl Manges, MD     ICD Vendor:  St Jude     ICD Model Number:  [phone]     ICD Serial Number:  [id number] ICD DOI:  04/12/2009     ICD Implanting MD:  Sherryl Manges, MD  Lead 1:    Location: RA     DOI: 04/12/2009     Model #: [id number]     Serial #: [id number]     Status: active Lead 2:    Location: RV     DOI: 04/12/2009     Model #: [id number]     Serial #: [id number]     Status: active Lead 3:    Location: LV     DOI: 04/12/2009     Model #: 1258T     Serial #: [id number]     Status: active  Indications::  CM   ICD Follow Up Remote Check?  No Battery Voltage:  >95% V     Charge Time:  8.3 seconds     Battery Est. Longevity:  4.1 YEARS Underlying rhythm:  SB ICD Dependent:  No       ICD Device Measurements Atrium:  Amplitude: 5 mV, Impedance: 430 ohms, Threshold: 1.0 V at 0.5 msec Right Ventricle:  Amplitude: 12 mV, Impedance: 510 ohms, Threshold: 0.5 V at 0.5 msec Left Ventricle:  Impedance: 430 ohms, Threshold: 1.25 V at 0.5 msec Configuration: LV RING TO RV COIL Shock Impedance: 46 ohms   Episodes MS Episodes:  22     Percent Mode Switch:  <1%     Shock:  0     ATP:  0     Nonsustained:  0     Atrial Pacing:  27%     Ventricular Pacing:  >99%  Brady Parameters Mode DDD     Lower Rate Limit:  60     Upper Rate Limit 120 PAV 150     Sensed AV Delay:  120  Tachy Zones VF:  240     VT:  200     Next Cardiology Appt Due:  07/05/2009 Tech Comments:  Wound check appt.  Steri-strips removed.  Wound without redness or edema.  Pt still with tenderness over device site.  Normal device function. All mode switch episodes are less than 1 minute.  No changes made today.  ROV 3 months SK. Gypsy Balsam RN BSN  May 03, 2009 12:18 PM

## 2010-03-06 NOTE — Progress Notes (Signed)
Summary: Cardiology Phone Note - Swelling around ICD  Phone Note Call from Patient   Caller: Spouse Summary of Call: received call from dale, the husband of ms. Beaudin, stating that over the past 2 days they have noted some swelling around her device site and the site is mildly tender.  they do not think the swelling is any worse today then it was 2 days ago.  she has had no fever or chills.  there is no erythema, heat, or drainage.  i recommended that at this point it does not sound as though they need to come into ther ER tonight but that if Ss worsened or if she developed a fever, they should have a low threshold to do so.  i asked them to call back into the office in the am in hopes of being seen in device clinic tomorrow to have the site evaluated.  they will call back in the am and were grateful for the callback. Initial call taken by: Creig Hines, ANP-BC,  Jun 12, 2009 8:16 PM

## 2010-03-06 NOTE — Miscellaneous (Signed)
Summary: Device preload  Clinical Lists Changes  Observations: Added new observation of ICD INDICATN: CM (04/13/2009 13:32) Added new observation of ICDLEADSTAT3: active (04/13/2009 13:32) Added new observation of ICDLEADSER3: [id number] (04/13/2009 13:32) Added new observation of ICDLEADMOD3: 1258T (04/13/2009 13:32) Added new observation of ICDLEADLOC3: LV (04/13/2009 13:32) Added new observation of ICDLEADSTAT2: active (04/13/2009 13:32) Added new observation of ICDLEADSER2: [id number] (04/13/2009 13:32) Added new observation of ICDLEADMOD2: 7121  (04/13/2009 13:32) Added new observation of ICDLEADLOC2: RV  (04/13/2009 13:32) Added new observation of ICDLEADSTAT1: active  (04/13/2009 13:32) Added new observation of ICDLEADSER1: [id number]  (04/13/2009 13:32) Added new observation of ICDLEADMOD1: 4010UV  (04/13/2009 13:32) Added new observation of ICDLEADLOC1: RA  (04/13/2009 13:32) Added new observation of ICD IMP MD: Sherryl Manges, MD  (04/13/2009 13:32) Added new observation of ICDLEADDOI3: 04/12/2009  (04/13/2009 13:32) Added new observation of ICDLEADDOI2: 04/12/2009  (04/13/2009 13:32) Added new observation of ICDLEADDOI1: 04/12/2009  (04/13/2009 13:32) Added new observation of ICD IMPL DTE: 04/12/2009  (04/13/2009 13:32) Added new observation of ICD SERL#: [id number]  (04/13/2009 13:32) Added new observation of ICD MODL#: [id number]  (04/13/2009 25:95) Added new observation of ICDMANUFACTR: [employer] Jude  (04/13/2009 13:32) Added new observation of ICD MD: Sherryl Manges, MD  (04/13/2009 13:32)       ICD Specifications Following MD:  Sherryl Manges, MD     ICD Vendor:  [employer] Jude     ICD Model Number:  [id number]     ICD Serial Number:  [id number] ICD DOI:  04/12/2009     ICD Implanting MD:  Sherryl Manges, MD  Lead 1:    Location: RA     DOI: 04/12/2009     Model #: [id number]     Serial #: [id number]     Status: active Lead 2:    Location: RV     DOI: 04/12/2009     Model #: 0932     Serial #: [id number]      Status: active Lead 3:    Location: LV     DOI: 04/12/2009     Model #: 1258T     Serial #: [id number]     Status: active  Indications::  CM

## 2010-03-06 NOTE — Progress Notes (Signed)
Summary: order  Phone Note From Other Clinic Call back at [phone]   Caller: Merilene Jenkins/Short Stay Summary of Call: need orders for pts defib placement, please fax to [phone] Initial call taken by: Migdalia Dk,  April 11, 2009 2:09 PM  Follow-up for Phone Call        pt has not come in to office for blood work as requested- husband informed of appt. Information forwarded to [facility].  Orders faxed. Follow-up by: Charlena Cross, RN, BSN,  April 11, 2009 4:01 PM

## 2010-03-06 NOTE — Progress Notes (Signed)
Summary: PHI  PHI   Imported By: Harlon Flor 03/27/2009 14:29:37  _____________________________________________________________________  External Attachment:    Type:   Image     Comment:   External Document

## 2010-03-06 NOTE — Cardiovascular Report (Signed)
Summary: Office Visit Remote   Office Visit Remote   Imported By: Roderic Ovens 11/23/2009 12:05:20  _____________________________________________________________________  External Attachment:    Type:   Image     Comment:   External Document

## 2010-03-06 NOTE — Procedures (Signed)
Summary: wound check add on   Current Medications (verified): 1)  Aspirin 81 Mg Tbec (Aspirin) .... Take One Tablet By Mouth Daily 2)  Levothroid 100 Mcg Tabs (Levothyroxine Sodium) .Marland Kitchen.. 1 Tab By Mouth Once Daily 3)  Losartan Potassium 50 Mg Tabs (Losartan Potassium) .Marland Kitchen.. 1 Tab By Mouth Daily 4)  Spironolactone 25 Mg Tabs (Spironolactone) .... 1/2 Tab By Mouth Once Daily 5)  Hydrocodone-Acetaminophen 5-500 Mg Tabs (Hydrocodone-Acetaminophen) .Marland Kitchen.. 1 By Mouth Two Times A Day 6)  Coreg 25 Mg Tabs (Carvedilol) .... 1/2 By Mouth Am and 1 By Mouth Pm 7)  Spiriva Handihaler 18 Mcg Caps (Tiotropium Bromide Monohydrate) .... One Puff Daily 8)  Digoxin 0.125 Mg Tabs (Digoxin) .... One By Mouth Daily 9)  Symbicort 160-4.5 Mcg/act Aero (Budesonide-Formoterol Fumarate) .... One Puff Daily 10)  Lasix 40 Mg Tabs (Furosemide) .... One By Mouth Two Times A Day 11)  Ambien 10 Mg Tabs (Zolpidem Tartrate) .... 1/2 By Mouth At Bedtime 12)  Pantoprazole Sodium 40 Mg Tbec (Pantoprazole Sodium) .... One By Mouth Daily 13)  Mirapex 0.25 Mg Tabs (Pramipexole Dihydrochloride) .... One By Mouth Daily At Bedtime 14)  Duoneb 0.5-2.5 (3) Mg/69ml Soln (Ipratropium-Albuterol) .... Every 4 Hours As Needed 15)  Cymbalta 60 Mg Cpep (Duloxetine Hcl) .... One By Mouth Daily 16)  Proventil Hfa 108 (90 Base) Mcg/act Aers (Albuterol Sulfate) .... 2 Puffs As Needed Every 4 Hours 17)  Flexeril 10 Mg Tabs (Cyclobenzaprine Hcl) .... One By Mouth Three Times A Day  Allergies (verified): No Known Drug Allergies   ICD Specifications Following MD:  Sherryl Manges, MD     ICD Vendor:  St Jude     ICD Model Number:  [phone]     ICD Serial Number:  [id number] ICD DOI:  04/12/2009     ICD Implanting MD:  Sherryl Manges, MD  Lead 1:    Location: RA     DOI: 04/12/2009     Model #: [id number]     Serial #: [id number]     Status: active Lead 2:    Location: RV     DOI: 04/12/2009     Model #: 7121     Serial #: [id number]     Status: active Lead 3:     Location: LV     DOI: 04/12/2009     Model #: 1258T     Serial #: [id number]     Status: active  Indications::  CM   ICD Follow Up ICD Dependent:  No       ICD Device Measurements Configuration: LV RING TO RV COIL  Brady Parameters Mode DDD     Lower Rate Limit:  60     Upper Rate Limit 120 PAV 150     Sensed AV Delay:  120  Tachy Zones VF:  240     VT:  200

## 2010-03-06 NOTE — Letter (Signed)
Summary: Medical Record Release  Medical Record Release   Imported By: Harlon Flor 03/23/2009 15:13:37  _____________________________________________________________________  External Attachment:    Type:   Image     Comment:   External Document

## 2010-03-09 NOTE — Cardiovascular Report (Signed)
Summary: Office Visit   Office Visit   Imported By: Roderic Ovens 11/23/2009 12:03:48  _____________________________________________________________________  External Attachment:    Type:   Image     Comment:   External Document

## 2010-03-18 ENCOUNTER — Encounter (INDEPENDENT_AMBULATORY_CARE_PROVIDER_SITE_OTHER): Payer: Self-pay | Admitting: *Deleted

## 2010-03-28 NOTE — Letter (Signed)
Summary: Remote Device Check  [employer], Main Office  [address]   [place], [place] [address]   Phone: [phone]  Fax: [phone]     March 18, 2010 MRN: [id number]   [facility] [address] [place], [place]  [address]   Dear Ms. [facility],   Your remote transmission was recieved and reviewed by your physician.  All diagnostics were within normal limits for you.   __X____Your next office visit is scheduled for:  March 2012 with Dr Graciela Husbands in [place]. Please call our office to schedule an appointment.    Sincerely,  Vella Kohler

## 2010-03-28 NOTE — Cardiovascular Report (Signed)
Summary: Office Visit Remote   Office Visit Remote   Imported By: Roderic Ovens 03/20/2010 15:17:55  _____________________________________________________________________  External Attachment:    Type:   Image     Comment:   External Document

## 2010-04-24 LAB — BASIC METABOLIC PANEL
BUN: 8 mg/dL (ref 6–23)
Calcium: 9.1 mg/dL (ref 8.4–10.5)
Creatinine, Ser: 0.81 mg/dL (ref 0.4–1.2)
GFR calc non Af Amer: >60 mL/min (ref >60)
Glucose, Bld: 120 mg/dL — ABNORMAL HIGH (ref 70–99)
Potassium: 3.5 mEq/L (ref 3.5–5.1)

## 2010-04-24 LAB — CBC
HCT: 36.7 % (ref 36.0–46.0)
Platelets: 243 10*3/uL (ref 150–400)
RDW: 13.7 % (ref 11.5–15.5)
WBC: 7.8 10*3/uL (ref 4.0–10.5)

## 2010-04-24 LAB — APTT: aPTT: 34 seconds (ref 24–37)

## 2010-04-24 LAB — PROTIME-INR: Prothrombin Time: 13.3 seconds (ref 11.6–15.2)

## 2010-04-27 LAB — CBC
HCT: 31.7 % — ABNORMAL LOW (ref 36.0–46.0)
Hemoglobin: 10.6 g/dL — ABNORMAL LOW (ref 12.0–15.0)
MCHC: 33.5 g/dL (ref 30.0–36.0)
MCHC: 33.5 g/dL (ref 30.0–36.0)
MCV: 87.4 fL (ref 78.0–100.0)
MCV: 88.1 fL (ref 78.0–100.0)
Platelets: 185 10*3/uL (ref 150–400)
Platelets: 247 10*3/uL (ref 150–400)
RBC: 3.59 MIL/uL — ABNORMAL LOW (ref 3.87–5.11)
WBC: 6.6 10*3/uL (ref 4.0–10.5)
WBC: 7.7 10*3/uL (ref 4.0–10.5)
WBC: 7.9 10*3/uL (ref 4.0–10.5)

## 2010-04-27 LAB — PROTIME-INR
INR: 0.9 (ref 0.00–1.49)
INR: 0.97 (ref 0.00–1.49)
Prothrombin Time: 12.1 seconds (ref 11.6–15.2)
Prothrombin Time: 12.8 seconds (ref 11.6–15.2)

## 2010-04-27 LAB — DIFFERENTIAL
Eosinophils Relative: 5 % (ref 0–5)
Lymphocytes Relative: 24 % (ref 12–46)
Lymphs Abs: 1.8 10*3/uL (ref 0.7–4.0)
Monocytes Absolute: 1 10*3/uL (ref 0.1–1.0)
Monocytes Relative: 13 % — ABNORMAL HIGH (ref 3–12)
Neutro Abs: 4.4 10*3/uL (ref 1.7–7.7)

## 2010-04-27 LAB — BASIC METABOLIC PANEL
BUN: 11 mg/dL (ref 6–23)
Calcium: 9.1 mg/dL (ref 8.4–10.5)
Chloride: 101 mEq/L (ref 96–112)
Creatinine, Ser: 0.76 mg/dL (ref 0.4–1.2)
GFR calc Af Amer: >60 mL/min (ref >60)

## 2010-05-22 ENCOUNTER — Telehealth: Payer: Self-pay

## 2010-05-22 MED ORDER — LOSARTAN POTASSIUM 50 MG PO TABS: 50.0000 mg | ORAL_TABLET | Freq: Every day | ORAL | Status: DC

## 2010-05-22 NOTE — Telephone Encounter (Signed)
Needs a refill called in for Losartan Potassium 50 mg one tablet daily.

## 2010-06-22 NOTE — Op Note (Signed)
[facility]  Patient:    Stacy Grimes, Stacy Grimes                MRN: [id number] Proc. Date: 10/03/99 Adm. Date:  [id number] Attending:  Lauree Chandler CC:         Corwin Levins, M.D. [facility]                           Operative Report  PREOPERATIVE DIAGNOSES:  Stress urinary incontinence.  POSTOPERATIVE DIAGNOSES:  Stress urinary incontinence.  PROCEDURE:  Pubovaginal sling.  SURGEON:  Maretta Bees. Vonita Moss, M.D.  ANESTHESIA:  General.  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  Her bladder was stable and urodynamic.  She has had a previous hysterectomy.  DESCRIPTION OF PROCEDURE:  The patient was brought to the operating room and placed in the lithotomy position and lower abdomen and external genitalia prepped and draped in the usual fashion.  Xylocaine with epinephrine was injected suburethrally in the vaginal mucosa, and a midline vaginal incision was made just below the urethra and dissected laterally to obtain access to the endopelvic fascia bilaterally.  Two suprapubic linear incisions were made on each side of the midline and dissected down the fascia.  The fascial graft, which had been soaked in triple antibiotic solution was folded in half lengthwise, so it was about 1-1/2 to 2 cm wide with a length of about 8 cm and #1 Prolene sutures tied on each end previously.  The Raz needle passer was then used to perforate the rectus fascia just at the level of the symphysis pubis.  It was passed down through the endopelvic fascia, and the Prolene suture was threaded through the needles on each side and the grasper brought up easily and in good position.  The graft was sewn in place proximally and distally suburethrally with 3-0 Vicryl.  The graft lay in a nice, flat position.  The wound was irrigated with triple antibiotic solution.  Vaginal mucosa was closed with a running 2-0 Vicryl.  I  then cystoscoped her, and there was no evidence of injury to the bladder or suture material, and the ureteral orifices were well away from the bladder neck, and I could pull on the sutures and see some movement on each side of the bladder neck.  With the bladder full, and the patient in Trendelenburg position, and under visual control, I inserted a microvasive suprapubic tube that came through just distal to the air bubble in the bladder.  The full coil was placed in the suprapubic tube, and the suprapubic tube was sewn in place with a nylon suture.  Slider connected to closed drainage.  The Prolene sutures were tied down at the skin level over an instrument on each side, and the suprapubic wounds were irrigated with triple antibiotic solution.  Suprapubic wounds were closed with skin staples.  The vaginal canal was packed with bacitracin lubricated vaginal pack.  The wound was dressed with dry, sterile gauze dressings.  She was taken to the recovery room in good condition, having tolerated the procedure well with correct sponge, needle and instrument counts. DD:  10/03/99 TD:  10/03/99 Job: [id number]

## 2010-06-22 NOTE — Consult Note (Signed)
Stacy Grimes, TAITT NO.:  [id number]   MEDICAL RECORD NO.:  [id number]          PATIENT TYPE:  INP   LOCATION:  6740                         FACILITY:  [facility]   PHYSICIAN:  Charlton Haws, M.D.     DATE OF BIRTH:  [birth year]   DATE OF CONSULTATION:  DATE OF DISCHARGE:                                   CONSULTATION   DATE OF CONSULTATION:  March 11, 2005.   HISTORY OF PRESENT ILLNESS:  Ms. Rolin is a 60 year old patient admitted  to Dr. Aundria Rud' service for COPD exacerbation.  She has been having some  increasing shortness of breath and bronchitis.   She normally sees a Dr. Anna Genre at [facility].   The patient was noted to have a wide complex tachycardia on her monitor.  She had borderline enzymes.  We were asked to see her to rule out  significant cardiac disease.   In talking to the patient, she has had some sort of a stress test about six  years ago.   She says that they told her she had a blockage in the bottom of the heart  but just treated her medically.   She is a one to two pack per day smoker.  She claims to be disabled due to  back problems and a previous wrist fracture.   She does not wear oxygen at home.  She does walk and gets along.  She is  fairly sedentary.   She has had previous right lower lobe pneumonias and bronchitis.   On admission to the hospital here, her chest x-ray showed bronchial  thickening, but no frank pneumonia.   She has been having progressing dyspnea for about five days with preceding  URI-type symptoms.   PAST MEDICAL HISTORY:  1.  Some urinary incontinence.  2.  Bladder surgery.  3.  Back problems.  4.  Previous wrist surgery.  5.  Hypertension.  6.  A question of hyperlipidemia.  7.  GERD.  8.  A history of depression.  9.  Status post chole six years ago.  10. Hysterectomy over 26 years ago.  11. Previous ear surgery.  12. She has not had any recent workup for her heart, and since she has  been      in the hospital she has been placed on steroids.   ALLERGIES:  She has no known allergies.   MEDICATIONS:  Listed in the chart.  She is currently on prednisone 20 mg  t.i.d., Flexeril, Vicodin, an aspirin a day, Prilosec, Lovastatin, and  Prozac.   I do not think she can currently be started on a beta blocker since she is  actively wheezing.   PHYSICAL EXAMINATION:  GENERAL:  She is obese.  VITAL SIGNS:  Blood pressure is 130/70.  She is in a sinus rhythm at a rate  of 88.  LUNGS:  Expiratory wheezes and rhonchi.  HEART:  The heart sounds are distant.  ABDOMEN:  She is status post multiple abdominal surgeries.  EXTREMITIES:  Distal pulses are intact with trace edema.   ELECTROCARDIOGRAM:  EKG shows a  sinus rhythm.  There are T-wave inversions  in V1 through V4.  This may represent RV strain, but the significant  inversions in V4 may be suggestive of coronary ischemia, particularly since  they are fluctuating.   LABORATORY DATA:  Her troponin was 0.02, her MB was elevated but only had an  index of 1.60.   IMPRESSION:  The patient has significant emphysema and bronchitic lung  disease.  She is getting treated for this with Xopenex and steroids.  She  cannot afford to have a cardiac problem.  We will write her for a two-  dimensional echocardiogram in the morning to assess right ventricular and  left ventricular function.  She will then have a right and left heart  catheterization to further assess her filling pressures and rule out  significant left anterior descending disease.   In reviewing her monitor strips, it appears that she has an intermittent  left bundle branch block.  She clearly has P-waves in front of a wide  complex left bundle branch pattern.  I suspect that this is not ventricular  tachycardia but may represent flutter with a left bundle.   In either case, particularly since we cannot use beta blockers right now, it  is important to make sure she has  no coronary disease and a structurally  normal right ventricle and left ventricle.   After her catheterization, it will be reasonable for Electrophysiology to  see the patient.   For the time being, since she is not having chest pain and her enzymes are  not really positive, I think she can stay on an aspirin a day.  She will  continue her steroids.   The patient is very anxious.  The risks of catheterization were discussed.  She wishes to proceed, but I suspect she will need Versed during the  procedure.           ______________________________  Charlton Haws, M.D.     PN/MEDQ  D:  03/11/2005  T:  03/11/2005  Job:  [id number]

## 2010-06-22 NOTE — Op Note (Signed)
NAME:  Stacy Grimes, Stacy Grimes                   ACCOUNT NO.:  [id number]   MEDICAL RECORD NO.:  [id number]                   PATIENT TYPE:  AMB   LOCATION:  DSC                                  FACILITY:  [facility]   PHYSICIAN:  Cindee Salt, M.D.                    DATE OF BIRTH:  [birth year]   DATE OF PROCEDURE:  08/19/2002  DATE OF DISCHARGE:                                 OPERATIVE REPORT   PREOPERATIVE DIAGNOSIS:  Status post Colles fracture, right wrist.   POSTOPERATIVE DIAGNOSIS:  Status post Colles fracture, right wrist.   OPERATIONS:  Debridement of transjugular fibrocartilage complex tear,  lunotriquetral tear, and synovectomy of right wrist.   SURGEON:  Cindee Salt, M.D.   [name].   HISTORY:  The patient is a 60 year old female with a history of a fall and  distal radius fracture.  She has had significant wrist pain since that time.  She is admitted for arthroscopy.  MRI reveals edema within the bones.  Bone  scan is positive, indicative of arthritic changes.   DESCRIPTION OF PROCEDURE:  The patient was brought to the operating room.  A  general anesthetic was carried out without difficulty.  She was prepped and  draped using Duraprep in the supine position with the right arm free.  The  limb was placed in the arthroscopy tower.  Ten pounds of traction were  applied.  The joint was inflated through a 3-4 portal with care to protect  the EPL tendon.  The portal was opened with a transverse incision and  deepened with a hemostat.  A blunt trocar was used to enter the joint.  The  joint was inspected.  A very significant synovitis was present across the  entire carpus across the scapholunate ligament with panis into the joint.  An irrigation catheter was placed in 6U.  A 4-5 portal was opened.  Debridement was then performed using an Arthrowand and biopsy forceps.  Biopsies of the synovial tissue were taken and sent to pathology.  A  transjugular fibrocartilage  complex tear was appreciated.  The ulnar side  was inspected.  The volar ligaments were intact radially and ulnarly.  A  lunotriquetral partial tear was present.  The scapholunate did show some  stretching, but appeared intact.  The debridement was performed both  dorsally and palmarly on the scapholunate ligament.  The TFCC was also  debrided.  There were no changes in the ulnar head and as such it was  decided not to proceed with an ulnar shortening __________ dome.  The carpal  joint was then inspected.  A very significant synovitis was present.  Difficulties in obtaining portals prevented full synovectomy and as such  after inspection of the scapholunate triquetral joint with no instability,  the joint was injected with Depo-Medrol.  The instruments were removed.  The  portals were closed with interrupted 5-0 nylon sutures.  A sterile  compressive dressing and dorsal palmar splint were applied.  The patient  tolerated the procedure well and was taken to the recovery room for  observation in satisfactory condition.  She is discharged home to return to  the [facility] in one week on Vicodin and Keflex.                                               Cindee Salt, M.D.    GK/MEDQ  D:  08/19/2002  T:  08/19/2002  Job:  [id number]

## 2010-06-22 NOTE — Cardiovascular Report (Signed)
[name], Grimes NO.:  [id number]   MEDICAL RECORD NO.:  [id number]          PATIENT TYPE:  INP   LOCATION:  6740                         FACILITY:  [facility]   PHYSICIAN:  Jonelle Sidle, M.D. LHCDATE OF BIRTH:  1950/11/07   DATE OF PROCEDURE:  03/12/2005  DATE OF DISCHARGE:                              CARDIAC CATHETERIZATION   INDICATIONS:  Ms. Patient is a 60 year old woman admitted to the hospital  with an apparent COPD exacerbation with additional history of hypertension,  tobacco use and possibly hyperlipidemia. She was noted to have what was felt  to be a wide complex tachycardia during her stay; however, in retrospect  this looks to be potentially sinus tachycardia versus atrial flutter with  left bundle branch block pattern. She has also had minimally abnormal  cardiac markers with a CK-MB of 1.6 but normal troponin I levels. She was  referred now for left and right heart catheterization to assess  hemodynamics, left ventricular function and coronary anatomy. A consultation  with electrophysiology is also pending. The risks and benefits were  explained to the patient and she has signed informed consent prior to  proceeding.   PROCEDURES PERFORMED:  1.  Left heart catheterization.  2.  Right heart catheterization.  3.  Selective coronary angiography.  4.  Left ventriculography.   ACCESS AND EQUIPMENT:  The area about the right femoral artery and vein was  anesthetized with 1% lidocaine. A 6-French sheath was placed in the right  femoral artery and a 7-French sheath was placed in the right femoral vein,  both via the modified Seldinger technique. Standard preformed 6-French JL-4  and JR-4 catheters were used for selective coronary angiography and an  angled pigtail catheter was used for left heart catheterization and left  ventriculography. These exchanges were made over wire. Also used was a 7-  [place] balloon-tipped flow-directed catheter for  right heart catheterization  without use of a wire. The patient tolerated the procedure well without  immediate complications. The total contrast load was 125 cc of Omnipaque. Of  note, during the procedure, she was also treated with a total of 20  milligrams of hydralazine for better blood pressure management and a dose of  Cardizem 10 milligrams IV in an effort to slow heart rate. She remained  hemodynamically stable throughout.   HEMODYNAMIC RESULTS:  Right atrium mean of 10. Right ventricle 56/10.  Pulmonary artery 41/24 with a mean of 30. Pulmonary capillary wedge pressure  mean of 14. Aorta 157/90. Left ventricle 161/9. Cardiac output 4.1 by the  Fick method. Cardiac index 2.1 by the Fick method. Arterial saturation is  90% on room air. Pulmonary artery saturation 61% on room air.   ANGIOGRAPHIC FINDINGS:  1.  The left main coronary artery is free of significant flow-limiting      coronary atherosclerosis and gives rise to the left anterior descending,      the circumflex coronary artery and a small bifurcating ramus      intermedius.  2.  The left anterior descending is a relatively large-caliber vessel with  two diagonal branches. There are smooth 20% stenoses in the proximal and      midportion of the vessel without any flow-limiting stenosis.  3.  The ramus intermedius is small and bifurcating without any significant      limiting coronary sclerosis.  4.  The service coronary artery is a large-caliber vessel with two obtuse      marginal branches. No significant flow-limiting coronary atherosclerosis      is noted within this system.  5.  The right coronary artery is a medium to large-caliber vessel that is      dominant. There is approximately 10-20% proximal stenosis. No other flow-      limiting stenoses are noted.  6.  Left ventriculography was performed in the RAO projection. It revealed      an ejection fraction of approximately 60% with trace mitral       regurgitation and no focal wall motion abnormality.   DIAGNOSES:  1.  Minor coronary atherosclerosis as outlined above with no major flow-      limiting stenoses in the epicardial vessels.  2.  Left ventricular ejection fraction approximately 60% with trace mitral      regurgitation and a left ventricular end-diastolic pressure of 9 mmHg.  3.  Pulmonary artery mean pressure of 30, right ventricular systolic      pressure of 56. Given gradient this raises the possibility of pulmonic      stenosis, although would review formal 2-D echocardiogram as well.      Cardiac index is normal at 2.1 and there was no major variation in the      left ventricular and right ventricular simultaneous tracings.   DISCUSSION:  I reviewed the results with the patient and her family. As per  Dr. Fabio Bering consult note, we will ask for electrophysiology to see the  patient formally. Would also suggest review of the final echocardiographic  report.           ______________________________  Jonelle Sidle, M.D. [facility]     SGM/MEDQ  D:  03/12/2005  T:  03/12/2005  Job:  [id number]   cc:   Charlton Haws, M.D.  [address]  [address]  [place]  [place] [address]   C. Ulyess Mort, M.D.  Fax: [phone]

## 2010-06-22 NOTE — Op Note (Signed)
NAME:  Stacy Grimes, Stacy Grimes                   ACCOUNT NO.:  [id number]   MEDICAL RECORD NO.:  [id number]                   PATIENT TYPE:  AMB   LOCATION:  DSC                                  FACILITY:  [facility]   PHYSICIAN:  Cindee Salt, M.D.                    DATE OF BIRTH:  14-Sep-1950   DATE OF PROCEDURE:  05/19/2003  DATE OF DISCHARGE:                                 OPERATIVE REPORT   PREOPERATIVE DIAGNOSIS:  Wrist pain, swelling, arthritis, right wrist.   POSTOPERATIVE DIAGNOSIS:  Wrist pain, swelling, arthritis, right wrist.   OPERATION:  Fusion right wrist.   SURGEON:  Cindee Salt, M.D.   [name]   ANESTHESIA:  General.   HISTORY:  The patient is a 59 year old female with a history of injury to  her wrist and continued pain.  She has undergone a synovectomy and this has  recurred with continued swelling.  She is admitted now for fusion.   DESCRIPTION OF PROCEDURE:  The patient was brought to the operating room  where a general anesthetic was carried out without difficulty.  She was  prepped using DuraPrep in a supine position.  It was found that the bone  graft was not available at this point in time, as such, the operation was  not started until the perfusion technician was available to obtain a bone  graft for Symphony in that a permit was not obtained for iliac crest graft.  Once the perfusionist and the bone graft was drawn and available, a straight  incision was made longitudinally in the dorsum of the hand after  exsanguination of the limb with an Esmarch bandage.  The tourniquet was  placed high on the arm and was inflated to 250 mmHg.  The dissection was  carried down through subcutaneous tissue.  Neurovascular structures were  identified and protected.  Bleeders were electrocauterized or tied with 4-0  Vicryl sutures.  Dissection was carried down to the third and fourth dorsal  compartment and incision made over Listers tubercle.  The EPL tendon was  released from its sheath.  Listers tubercle was then removed.  The  dissection was carried proximally and distally elevating the periosteum off  from the distal radius.  A very significant synovitis was present.  This was  not found to erode into the bones.  Significant cartilaginous changes were  present on cartilage of both the proximal scaphoid, proximal capitate and  the proximal and distal lunate.  A synovectomy was performed.  The rongeur  was then used to remove the cartilage and subchondral bone which was  extremely soft.  A bur was then used to remove cartilage from the concave  surfaces of the distal radius and distal carpal row.  A trough was then made  into the distal radius and into the capitate to the level of the carpal  metacarpal joint.  A short curved Synthes wrist fusion plate was  then  selected.  This was placed, confirmed position was done with [facility] image  intensification.  The plate was lined up for the distal radius and the third  metacarpal.  The plate was then affixed proximally.  This was done with the  2.5 mm drill bit.  Each was measured.  These measured between 10 and 22 mm.  The first screw placed was a 20 and this was slightly short.  The remaining  screws were placed.  The plate was slightly proud and further troughing was  made into the distal radius and into the capitate to allow this to set in a  more natural position.  The plate was affixed proximally.  The hand lined up  using image intensification along the third metacarpal and the distal screws  were placed.  These measured between 20 and 14 mm.  These were the 2.5 mm  screws.  Drilling was done with the 2 mm drill bit.  Each was measured.  This was then affixed distally.  The wound was copiously irrigated with  saline.  The Symphony bone graft was then mixed and this was packed into the  proximal and mid carpal joints firmly filling the entire defect.  Stability  was assured.  The wound was irrigated.   The periosteum and fourth dorsal  compartment was closed with figure-of-eight 4-0 Vicryl sutures.  A dual  vessel loop drain was placed.  The subcutaneous tissue was closed with  interrupted 4-0 Vicryl and the skin with interrupted 4-0 nylon sutures.  A  sterile compressive dressing and dorsal palmar splint were applied.  The  patient tolerated the procedure well and was taken to the recovery room for  observation in satisfactory condition.  She will be admitted for overnight  stay.  She will be discharged on Percocet and Keflex.                                               Cindee Salt, M.D.    GK/MEDQ  D:  05/19/2003  T:  05/19/2003  Job:  [id number]

## 2010-06-22 NOTE — Discharge Summary (Signed)
Stacy Grimes, Stacy Grimes              ACCOUNT NO.:  [id number]   MEDICAL RECORD NO.:  [id number]          PATIENT TYPE:  INP   LOCATION:  6740                         FACILITY:  [facility]   PHYSICIAN:  C. Ulyess Mort, M.D.DATE OF BIRTH:  02-08-1950   DATE OF ADMISSION:  03/08/2005  DATE OF DISCHARGE:  03/15/2005                                 DISCHARGE SUMMARY   DISCHARGE DIAGNOSES:  1.  Chronic obstructive pulmonary disease with  chronic obstructive      pulmonary disease exacerbation secondary to continued smoking and viral      upper respiratory infection.  2.  Cardiac arrhythmia, sinus tachycardia with transient ventricular      tachycardia on telemetry.  3.  Hypokalemia.  4.  Tobacco abuse.  5.  Hyperglycemia.  6.  Anxiety.  7.  Hyperlipidemia.  8.  Chronic back pain.  9.  Gastroesophageal reflux disease.   DISCHARGE MEDICATIONS:  1.  Prilosec 20 mg p.o. daily.  2.  Lovastatin 40 mg p.o. daily.  3.  Flexeril 10 mg p.o. t.i.d. p.r.n. muscle spasms.  4.  Fluoxetine 20 mg p.o. daily.  5.  Albuterol sulfate metered dose inhaler two puffs q.4 h. p.r.n. dyspnea.  6.  Aspirin 81 mg p.o. daily.  7.  Lortab 5/500 mg 1-2 tabs p.o. q.6 h. p.r.n. pain.  8.  Prednisone taper.  The patient is to take as directed by package      instructions.  9.  Atrovent metered dose inhaler two puffs q.i.d.  10. Nicotine patch 21 mg transdermal daily.   CONDITION ON DISCHARGE:  The patient was much improved and stable.   FOLLOW UP:  The patient is to follow up with Lonie Peak, P.A., on  March 21, 2005 at 10 a.m. to follow up on BMET including potassium and  blood glucose and smoking cessation.   PROCEDURES/DIAGNOSTIC STUDIES:  1.  Chest x-ray done on March 08, 2005 showed airway thickening suggestive      of chronic bronchitis or active airway disease.  2.  A 2-D echo on March 12, 2005 showed left ventricular ejection fraction      of 55-65% with no wall motion abnormalities.  3.   Cardiac catheterization on March 12, 2005, which showed 10-20%      blockage of her coronary arteries.  No significant blockage however.   CONSULTATIONS:  Cardiology, Dr. Corinda Gubler.   HISTORY OF PRESENT ILLNESS:  This is a 60 year old white female with history  of severe COPD, 45 year tobacco abuse history, presenting with worsening  dyspnea x5 days prior to admission with preceding URI symptoms of increased  coughing and yellowish sputum production and chest congestion.  She visited  her primary care Bobak Oguinn on February 12, 2005 and described diaphoresis,  chills, fatigue, and a sore throat.  She denied fevers or chest pain.  Her  primary care Isobelle Tuckett prescribed Pro Air inhaler and oral prednisone.  Since  then the patient's dyspnea has worsened, despite taking the prescribed  medications.  She visited her Greydis Stlouis on March 08, 2005 and was told to  come to [facility] ED.  While in the ED her O2 saturations were 92% on room  air and she received two Xopenex nebulizers and one dose of  methylprednisolone IV.   ALLERGIES:  None.   PAST MEDICAL HISTORY:  1.  COPD with no previous hospitalizations.  No home oxygen requirement.  2.  Questionable hypertension.  3.  Hyperlipidemia.  4.  Gastroesophageal reflux disease.  5.  Urinary incontinence.  6.  Chronic low back pain.  7.  History of depression.  8.  Status post cholecystectomy.  9.  Status post hysterectomy.  10. Status post ear surgery.  11. Status post right wrist surgery.   MEDICATIONS:  1.  Fluoxetine HCL 20 mg daily.  2.  Lovastatin 40 mg daily.  3.  Prilosec 20 mg daily.  4.  Aspirin 81 mg daily.  5.  Pro Air HFA 90 mcg two puffs q.4 p.r.n.  6.  Vicodin 5/500 1-2 tabs q.6 p.r.n.  7.  Flexeril 10 mg t.i.d. p.r.n.  8.  Prednisone 20 mg p.o. t.i.d.   SOCIAL HISTORY:  She has a 45 pack-year smoking history.  Occasional beer  drinker.  Never any illicit drug use.  The patient is married.  The patient  is on disability.   The patient has [employer].   FAMILY HISTORY:  Mother with cancer and stroke.  Father with COPD,  hypertension.  Sister with COPD.   REVIEW OF SYSTEMS:  As per HPI.   PHYSICAL EXAMINATION:  VITAL SIGNS:  Temperature 97.5, blood pressure  126/73, heart rate 105, respirations 22, O2 saturations 92 on room air.  GENERAL:  Mild distress, diaphoretic, coughing.  HEENT:  Pupils equally round and reactive to light.  Extraocular movements  intact.  Moist mucous membranes.  Oropharynx clear.  NECK:  Supple.  No masses.  LUNGS:  Expiratory wheezes present bilaterally with deep breaths leading to  a junky cough.  CARDIOVASCULAR:  Distant heart sounds.  Regular rate and rhythm.  No  murmurs, gallops, or rubs.  ABDOMEN:  Obese, soft, nontender, nondistended abdomen.  Bowel sounds  present.  No masses.  EXTREMITIES:  No clubbing, cyanosis, or edema.  SKIN:  Warm and dry.  LYMPHATICS:  No lymphadenopathy.  MUSCULOSKELETAL:  Range of motion within normal limits.  No tenderness to  palpation of joints and muscles.  NEUROLOGIC:  Grossly intact.  PSYCHIATRIC:  Appropriate.   LABORATORY DATA:  Sodium 137, potassium 3.7, chloride 101, bicarb 24, BUN  15, creatinine 0.9, glucose 143, calcium 9.1.  White blood cells 8.3,  hemoglobin 15.3, hematocrit 45.5, platelets 296, ANC 6.3, MCV 84.7.  Sputum  culture negative.  Peak flow 160.   EKG negative.   HOSPITAL COURSE:  1.  COPD EXACERBATION:  The patient was treated with empiric doxycycline, IV      methylprednisolone, Atrovent nebulizers, and albuterol nebulizers.  The      patient was also given supplemental oxygen to maintain oxygen saturation      above 92%.  The patient showed clinical improvement following treatment      with decreased wheezing, decreased work of breathing, peak flow increase      to 200, and O2 saturations on room air of 97%.  The patient was sent     home on prednisone taper, Atrovent metered dose inhaler, and  albuterol      inhaler.   1.  CARDIAC ARRHYTHMIA:  The patient was placed on telemetry during hospital      stay.  During her coughing spells telemetry  would __________tachycardia      and occasional ventricular tachycardia to 150 beats per minute.  The      patient was without chest pain or palpitations during her      hospitalization.  EKG showed T-wave inversion in leads V1 through V5 as      compared to her admitting EKG.  Cardiac enzymes x2 showed elevated CK      but troponin remained negative.  Cardiology was consulted and 2-D echo      and cardiac catheterization on March 12, 2005, both ruled out ischemia      or infarction.  It was deemed that the ventricular tachycardia was an      error on telemetry.  The patient was encouraged to continue aspirin.   1.  HYPOKALEMIA:  The patient developed hypokalemia down to 3, likely      secondary to Xopenex treatment.  The patient received supplemental      potassium and potassium at discharge had increased to 3.6.   1.  TOBACCO ABUSE:  The patient was counseled to quit smoking and received      information on consequences of tobacco use.  She expresses strong desire      to quit smoking and was given nicotine patches while inpatient.  The      patient seems very motivated to quit smoking tobacco.   1.  HYPERGLYCEMIA:  The patient exhibited hyperglycemia to 166 during her      hospitalization which was most likely secondary to steroid use.  The      patient was treated with sliding scale insulin during her      hospitalization; however, blood glucose will likely return to normal      upon finishing the prednisone taper.  The patient's glucose at discharge      was 93; however, we recommend checking blood glucose as an outpatient to      rule out diabetes.   1.  ANXIETY:  The patient exhibited anxiety regarding lung and heart health.      She was given alprazolam p.r.n. to calm her anxiety.   1.  HYPERLIPIDEMIA:  The patient had a  fasting lipid panel which showed      normal cholesterol values with an elevated triglyceride of 199.  She was      given Lovastatin daily.  Recommend follow-up as an outpatient.   1.  CHRONIC BACK PAIN:  The patient described multiple disk disease.  She      was continued on Flexeril and Vicodin as needed in house.  She was sent      home with the same medications and was scheduled a follow-up with her      outpatient physician who deals with her back pain.   1.  GERD:  The patient had no flares at present.  Continue Prilosec.   1.  QUESTIONABLE HYPERTENSION:  The patient exhibited no hypertension while      an inpatient and required no medication.   LABORATORY DATA:  Discharge labs and vitals:  Sodium 139, potassium 3.6,  chloride 105, bicarb 27, BUN 18, creatinine 0.7, glucose 93, calcium 8.4.  White blood cells 10.4, hemoglobin 13, hematocrit 39.2, platelets 275. Vital signs:  Temperature 97.2, heart rate 82, respirations 18, blood  pressure 116/69, O2 saturations 97% on room air.   Pending labs:  None.      Dennis Bast, MD    ______________________________  C. Ulyess Mort, M.D.    Rivka Safer  D:  03/21/2005  T:  03/21/2005  Job:  [id number]   cc:   Dennis Bast, MD  Fax: [phone]

## 2010-06-22 NOTE — H&P (Signed)
[facility]  Patient:    Stacy Grimes, Stacy Grimes                MRN: [id number] Adm. Date:  [id number] Attending:  Lauree Chandler CC:         Corwin Levins, M.D. [facility]   History and Physical  HISTORY OF PRESENT ILLNESS:  This 60 year old white female has had a long history of progress stress urinary incontinence.  She had previous urodynamic studies, which showed a good flow and no significant PVR, and no uninhibited bladder contractions.  She was tried in 1999 on Kegel exercises and Detrol, which did not significantly help her.  _____ also did not help her.  Her symptoms have gotten progressively worse, and she is now admitted for pubovaginal sling.  She has a mild cystourethrocele.  She has a history of a hysterectomy.  PAST MEDICAL HISTORY:  She has hypertension and esophageal reflux and a history of depression.  PAST SURGICAL HISTORY:  Cholecystectomy, hysterectomy, ear surgery, and recent ingrown toenail extraction.  MEDICATIONS:  Prilosec 20 mg a day, hydrocodone p.r.n. pain for her toe, Ziac 25 mg a day, Prozac 20 mg a day, Premarin 0.625 mg a day.  REVIEW OF SYSTEMS:  As noted on her health history form.  ALLERGIES:  None that she knows of.  SOCIAL HISTORY:  Tobacco:  Quit 30 hours ago.  Alcohol:  None.  FAMILY HISTORY:  Noncontributory except for hypertension and heart disease.  PHYSICAL EXAMINATION:  GENERAL:  A heavy-set white female in no acute distress.  VITAL SIGNS:  Blood pressure 146/88, pulse 68, temperature 98.7, respiratory rate 18.  SKIN:  Warm and dry.  CHEST:  She has a slight smokers cough, increased AP diameter to the chest.  HEART:  Heart tones are regular.  ABDOMEN:  Soft and nontender.  GENITOURINARY:  She has a very mild cystocele.  Uterus and cervix are absent.  IMPRESSION: 1. Stress urinary incontinence. 2. Hypertension. 3. Gastroesophageal reflux disease. 4. Depression. 5. Left hearing loss. DD:   10/03/99 TD:  10/03/99 Job: [id number]

## 2010-06-25 ENCOUNTER — Encounter: Payer: Self-pay | Admitting: Internal Medicine

## 2010-07-09 ENCOUNTER — Encounter: Payer: Self-pay | Admitting: Internal Medicine

## 2010-07-09 ENCOUNTER — Ambulatory Visit (INDEPENDENT_AMBULATORY_CARE_PROVIDER_SITE_OTHER): Payer: Medicare Other | Admitting: Internal Medicine

## 2010-07-09 DIAGNOSIS — I5022 Chronic systolic (congestive) heart failure: Secondary | ICD-10-CM

## 2010-07-09 DIAGNOSIS — I428 Other cardiomyopathies: Secondary | ICD-10-CM

## 2010-07-09 DIAGNOSIS — Z95810 Presence of automatic (implantable) cardiac defibrillator: Secondary | ICD-10-CM | POA: Insufficient documentation

## 2010-07-09 NOTE — Assessment & Plan Note (Signed)
The patient's device was interrogated.  The information was reviewed. No changes were made in the programming.    

## 2010-07-09 NOTE — Patient Instructions (Signed)
Follow up in one year with Dr. Graciela Husbands. Continue on the same medications.

## 2010-07-09 NOTE — Assessment & Plan Note (Signed)
Stable on current meds 

## 2010-07-09 NOTE — Progress Notes (Signed)
HPI  Stacy Grimes is a 60 y.o. 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 This was done spring 2011.   Her ejection fraction of 10-15% and catheterization demonstrated no significant obstructive disease. She has been treated for the last number of months with beta blockers ACE inhibitors and Aldactone with more recent assessment of her ejection fraction last 2 weeks of 23%. She continues to have significant symptoms.  She is Significantly improved from an exercise point of view.  She has had no syncope; she has had tachycardia palpitations as well as irregularities that sound lik  Past Medical History  Diagnosis Date  . Non-ischemic cardiomyopathy   . CHF (congestive heart failure)   . Dilated cardiomyopathy   . Chronic systolic heart failure   . Hypertension   . COPD (chronic obstructive pulmonary disease)   . Deafness   . AAA (abdominal aortic aneurysm)     Past Surgical History  Procedure Date  . Cardiac catheterization   . Cholecystectomy   . Bladder surgery   . Knee surgery     right  . Wrist surgery     right  . Middle ear surgery   . Cardiac defibrillator placement     Current Outpatient Prescriptions  Medication Sig Dispense Refill  . albuterol (PROVENTIL HFA) 108 (90 BASE) MCG/ACT inhaler Inhale 2 puffs into the lungs every 6 (six) hours as needed.        . ALPRAZolam (XANAX) 0.25 MG tablet Take 0.25 mg by mouth at bedtime as needed.        Marland Kitchen aspirin 81 MG EC tablet Take 81 mg by mouth daily.        . budesonide-formoterol (SYMBICORT) 160-4.5 MCG/ACT inhaler Inhale 2 puffs into the lungs daily.        . carvedilol (COREG) 25 MG tablet Take 25 mg by mouth as directed. Take 1/2 tablet in the morning and 1 tablet in the evening       . digoxin (LANOXIN) 0.125 MG tablet Take 125 mcg by mouth daily.        . furosemide (LASIX) 40 MG tablet Take 40 mg by mouth 2 (two) times  daily.        Marland Kitchen ipratropium-albuterol (DUONEB) 0.5-2.5 (3) MG/3ML SOLN Take 3 mLs by nebulization every 4 (four) hours as needed.        Marland Kitchen levothyroxine (LEVOTHROID) 100 MCG tablet Take 100 mcg by mouth daily.        Marland Kitchen losartan (COZAAR) 50 MG tablet Take 1 tablet (50 mg total) by mouth daily.  30 tablet  6  . metoCLOPramide (REGLAN) 5 MG tablet Take 5 mg by mouth daily.        . pantoprazole (PROTONIX) 40 MG tablet Take 40 mg by mouth daily.        . pramipexole (MIRAPEX) 0.25 MG tablet Take 0.25 mg by mouth at bedtime.        Marland Kitchen spironolactone (ALDACTONE) 25 MG tablet Take 12.5 mg by mouth daily.        Marland Kitchen tiotropium (SPIRIVA) 18 MCG inhalation capsule Place 18 mcg into inhaler and inhale daily.        Marland Kitchen venlafaxine (EFFEXOR-XR) 37.5 MG 24 hr capsule Take 37.5 mg by mouth daily.        Marland Kitchen zolpidem (AMBIEN) 10 MG tablet Take 10 mg by mouth at bedtime as needed.  No Known Allergies  Review of Systems negative except from HPI and PMH  Physical Exam Well developed and well nourished obese Caucasian  Female wearing oxygen in no acute distress HENT normal E scleral and icterus clear Neck Supple JVP flat; carotids brisk and full Clear to ausculation Regular rate and rhythm, no murmurs gallops or rub Soft with active bowel sounds No clubbing cyanosis and edema Alert and oriented, grossly normal motor and sensory function Skin Warm and Dry   Assessment and  Plan

## 2010-07-09 NOTE — Assessment & Plan Note (Signed)
On appropriate medications will titrated

## 2010-09-10 ENCOUNTER — Other Ambulatory Visit: Payer: Self-pay | Admitting: *Deleted

## 2010-09-21 ENCOUNTER — Other Ambulatory Visit: Payer: Self-pay | Admitting: *Deleted

## 2010-09-21 MED ORDER — FUROSEMIDE 40 MG PO TABS: 40.0000 mg | ORAL_TABLET | Freq: Two times a day (BID) | ORAL | Status: DC

## 2010-10-11 ENCOUNTER — Encounter: Payer: Self-pay | Admitting: Internal Medicine

## 2010-10-11 ENCOUNTER — Ambulatory Visit (INDEPENDENT_AMBULATORY_CARE_PROVIDER_SITE_OTHER): Payer: Medicare Other | Admitting: *Deleted

## 2010-10-11 ENCOUNTER — Other Ambulatory Visit: Payer: Self-pay | Admitting: Internal Medicine

## 2010-10-11 DIAGNOSIS — I428 Other cardiomyopathies: Secondary | ICD-10-CM

## 2010-10-11 DIAGNOSIS — Z95810 Presence of automatic (implantable) cardiac defibrillator: Secondary | ICD-10-CM

## 2010-10-11 LAB — REMOTE ICD DEVICE
ATRIAL PACING ICD: 1 pct
BAMS-0001: 150 {beats}/min
HV IMPEDENCE: 56 Ohm
RV LEAD IMPEDENCE ICD: 650 Ohm
TZAT-0004SLOWVT: 8
TZAT-0018SLOWVT: NEGATIVE
TZON-0003SLOWVT: 300 ms
TZON-0010SLOWVT: 80 ms
TZST-0001SLOWVT: 2
TZST-0001SLOWVT: 3
TZST-0001SLOWVT: 5
TZST-0003SLOWVT: 40 J
VENTRICULAR PACING ICD: 97 pct

## 2010-10-22 NOTE — Progress Notes (Signed)
icd remote check  

## 2010-10-24 ENCOUNTER — Encounter: Payer: Self-pay | Admitting: *Deleted

## 2010-12-17 ENCOUNTER — Other Ambulatory Visit: Payer: Self-pay | Admitting: *Deleted

## 2010-12-17 MED ORDER — LOSARTAN POTASSIUM 50 MG PO TABS: 50.0000 mg | ORAL_TABLET | Freq: Every day | ORAL | Status: DC

## 2011-01-10 ENCOUNTER — Ambulatory Visit (INDEPENDENT_AMBULATORY_CARE_PROVIDER_SITE_OTHER): Payer: Medicare Other | Admitting: *Deleted

## 2011-01-10 DIAGNOSIS — I428 Other cardiomyopathies: Secondary | ICD-10-CM

## 2011-01-10 DIAGNOSIS — Z95810 Presence of automatic (implantable) cardiac defibrillator: Secondary | ICD-10-CM

## 2011-01-11 ENCOUNTER — Other Ambulatory Visit: Payer: Self-pay | Admitting: Internal Medicine

## 2011-01-11 ENCOUNTER — Encounter: Payer: Self-pay | Admitting: Internal Medicine

## 2011-01-11 LAB — REMOTE ICD DEVICE
AL AMPLITUDE: 5 mv
AL IMPEDENCE ICD: 380 Ohm
BRDY-0002RA: 60 {beats}/min
BRDY-0003RA: 120 {beats}/min
BRDY-0004RA: 120 {beats}/min
HV IMPEDENCE: 55 Ohm
TZAT-0004SLOWVT: 8
TZAT-0013SLOWVT: 3
TZAT-0018SLOWVT: NEGATIVE
TZAT-0019SLOWVT: 7.5 V
TZON-0003SLOWVT: 300 ms
TZON-0004SLOWVT: 30
TZST-0001SLOWVT: 2
TZST-0001SLOWVT: 3
TZST-0001SLOWVT: 5
TZST-0003SLOWVT: 36 J

## 2011-01-22 NOTE — Progress Notes (Signed)
Remote icd check  

## 2011-02-08 ENCOUNTER — Encounter: Payer: Self-pay | Admitting: *Deleted

## 2011-02-21 DIAGNOSIS — M109 Gout, unspecified: Secondary | ICD-10-CM | POA: Diagnosis not present

## 2011-03-17 ENCOUNTER — Other Ambulatory Visit: Payer: Self-pay | Admitting: Internal Medicine

## 2011-03-18 DIAGNOSIS — J438 Other emphysema: Secondary | ICD-10-CM | POA: Diagnosis not present

## 2011-03-18 DIAGNOSIS — R0602 Shortness of breath: Secondary | ICD-10-CM | POA: Diagnosis not present

## 2011-03-18 DIAGNOSIS — I509 Heart failure, unspecified: Secondary | ICD-10-CM | POA: Diagnosis not present

## 2011-03-18 DIAGNOSIS — R079 Chest pain, unspecified: Secondary | ICD-10-CM | POA: Diagnosis not present

## 2011-03-18 DIAGNOSIS — Z79899 Other long term (current) drug therapy: Secondary | ICD-10-CM | POA: Diagnosis not present

## 2011-03-18 DIAGNOSIS — J449 Chronic obstructive pulmonary disease, unspecified: Secondary | ICD-10-CM | POA: Diagnosis not present

## 2011-03-18 DIAGNOSIS — M109 Gout, unspecified: Secondary | ICD-10-CM | POA: Diagnosis not present

## 2011-03-18 DIAGNOSIS — I10 Essential (primary) hypertension: Secondary | ICD-10-CM | POA: Diagnosis not present

## 2011-03-18 DIAGNOSIS — R011 Cardiac murmur, unspecified: Secondary | ICD-10-CM | POA: Diagnosis not present

## 2011-03-18 DIAGNOSIS — I428 Other cardiomyopathies: Secondary | ICD-10-CM | POA: Diagnosis not present

## 2011-04-01 DIAGNOSIS — I428 Other cardiomyopathies: Secondary | ICD-10-CM | POA: Diagnosis not present

## 2011-04-01 DIAGNOSIS — E039 Hypothyroidism, unspecified: Secondary | ICD-10-CM | POA: Diagnosis not present

## 2011-04-01 DIAGNOSIS — J449 Chronic obstructive pulmonary disease, unspecified: Secondary | ICD-10-CM | POA: Diagnosis not present

## 2011-04-01 DIAGNOSIS — I10 Essential (primary) hypertension: Secondary | ICD-10-CM | POA: Diagnosis not present

## 2011-04-01 DIAGNOSIS — I2510 Atherosclerotic heart disease of native coronary artery without angina pectoris: Secondary | ICD-10-CM | POA: Diagnosis not present

## 2011-04-08 DIAGNOSIS — I428 Other cardiomyopathies: Secondary | ICD-10-CM | POA: Diagnosis not present

## 2011-04-08 DIAGNOSIS — R079 Chest pain, unspecified: Secondary | ICD-10-CM | POA: Diagnosis not present

## 2011-04-08 DIAGNOSIS — J449 Chronic obstructive pulmonary disease, unspecified: Secondary | ICD-10-CM | POA: Diagnosis not present

## 2011-04-08 DIAGNOSIS — I10 Essential (primary) hypertension: Secondary | ICD-10-CM | POA: Diagnosis not present

## 2011-04-08 DIAGNOSIS — R0602 Shortness of breath: Secondary | ICD-10-CM | POA: Diagnosis not present

## 2011-04-08 DIAGNOSIS — I509 Heart failure, unspecified: Secondary | ICD-10-CM | POA: Diagnosis not present

## 2011-04-18 ENCOUNTER — Ambulatory Visit (INDEPENDENT_AMBULATORY_CARE_PROVIDER_SITE_OTHER): Payer: Medicare Other | Admitting: *Deleted

## 2011-04-18 ENCOUNTER — Encounter: Payer: Self-pay | Admitting: Internal Medicine

## 2011-04-18 DIAGNOSIS — I428 Other cardiomyopathies: Secondary | ICD-10-CM | POA: Diagnosis not present

## 2011-04-18 DIAGNOSIS — Z95810 Presence of automatic (implantable) cardiac defibrillator: Secondary | ICD-10-CM | POA: Diagnosis not present

## 2011-04-18 LAB — REMOTE ICD DEVICE
AL IMPEDENCE ICD: 350 Ohm
BRDY-0003RA: 120 {beats}/min
BRDY-0004RA: 120 {beats}/min
HV IMPEDENCE: 56 Ohm
LV LEAD IMPEDENCE ICD: 650 Ohm
RV LEAD IMPEDENCE ICD: 640 Ohm
TZAT-0001SLOWVT: 1
TZAT-0013SLOWVT: 3
TZAT-0020SLOWVT: 1 ms
TZON-0005SLOWVT: 6
TZST-0001SLOWVT: 2
TZST-0001SLOWVT: 4
TZST-0003SLOWVT: 36 J

## 2011-04-26 NOTE — Progress Notes (Signed)
Remote defib check  

## 2011-05-10 ENCOUNTER — Encounter: Payer: Self-pay | Admitting: *Deleted

## 2011-05-22 DIAGNOSIS — R928 Other abnormal and inconclusive findings on diagnostic imaging of breast: Secondary | ICD-10-CM | POA: Diagnosis not present

## 2011-05-23 DIAGNOSIS — I509 Heart failure, unspecified: Secondary | ICD-10-CM | POA: Diagnosis not present

## 2011-05-23 DIAGNOSIS — I10 Essential (primary) hypertension: Secondary | ICD-10-CM | POA: Diagnosis not present

## 2011-05-23 DIAGNOSIS — Z79899 Other long term (current) drug therapy: Secondary | ICD-10-CM | POA: Diagnosis not present

## 2011-05-23 DIAGNOSIS — F341 Dysthymic disorder: Secondary | ICD-10-CM | POA: Diagnosis not present

## 2011-05-23 DIAGNOSIS — J449 Chronic obstructive pulmonary disease, unspecified: Secondary | ICD-10-CM | POA: Diagnosis not present

## 2011-05-23 DIAGNOSIS — E039 Hypothyroidism, unspecified: Secondary | ICD-10-CM | POA: Diagnosis not present

## 2011-06-21 DIAGNOSIS — D649 Anemia, unspecified: Secondary | ICD-10-CM | POA: Diagnosis not present

## 2011-06-21 DIAGNOSIS — E039 Hypothyroidism, unspecified: Secondary | ICD-10-CM | POA: Diagnosis not present

## 2011-07-02 DIAGNOSIS — I428 Other cardiomyopathies: Secondary | ICD-10-CM | POA: Diagnosis not present

## 2011-07-02 DIAGNOSIS — I509 Heart failure, unspecified: Secondary | ICD-10-CM | POA: Diagnosis not present

## 2011-07-02 DIAGNOSIS — I10 Essential (primary) hypertension: Secondary | ICD-10-CM | POA: Diagnosis not present

## 2011-07-02 DIAGNOSIS — J449 Chronic obstructive pulmonary disease, unspecified: Secondary | ICD-10-CM | POA: Diagnosis not present

## 2011-07-02 DIAGNOSIS — J438 Other emphysema: Secondary | ICD-10-CM | POA: Diagnosis not present

## 2011-07-10 DIAGNOSIS — I10 Essential (primary) hypertension: Secondary | ICD-10-CM | POA: Diagnosis not present

## 2011-07-10 DIAGNOSIS — H539 Unspecified visual disturbance: Secondary | ICD-10-CM | POA: Diagnosis not present

## 2011-07-10 DIAGNOSIS — E039 Hypothyroidism, unspecified: Secondary | ICD-10-CM | POA: Diagnosis not present

## 2011-07-10 DIAGNOSIS — L659 Nonscarring hair loss, unspecified: Secondary | ICD-10-CM | POA: Diagnosis not present

## 2011-07-10 DIAGNOSIS — F341 Dysthymic disorder: Secondary | ICD-10-CM | POA: Diagnosis not present

## 2011-07-10 DIAGNOSIS — D649 Anemia, unspecified: Secondary | ICD-10-CM | POA: Diagnosis not present

## 2011-07-12 ENCOUNTER — Encounter: Payer: Medicare Other | Admitting: Internal Medicine

## 2011-07-16 ENCOUNTER — Encounter: Payer: Self-pay | Admitting: Internal Medicine

## 2011-07-16 ENCOUNTER — Ambulatory Visit (INDEPENDENT_AMBULATORY_CARE_PROVIDER_SITE_OTHER): Payer: Medicare Other | Admitting: Internal Medicine

## 2011-07-16 VITALS — BP 110/76 | HR 91 | Ht 63.0 in | Wt 205.4 lb

## 2011-07-16 DIAGNOSIS — I428 Other cardiomyopathies: Secondary | ICD-10-CM | POA: Diagnosis not present

## 2011-07-16 DIAGNOSIS — Z95810 Presence of automatic (implantable) cardiac defibrillator: Secondary | ICD-10-CM | POA: Diagnosis not present

## 2011-07-16 LAB — ICD DEVICE OBSERVATION
AL THRESHOLD: 0.875 V
DEVICE MODEL ICD: 605521
FVT: 0
HV IMPEDENCE: 57 Ohm
PACEART VT: 0
RV LEAD AMPLITUDE: 12 mv
RV LEAD IMPEDENCE ICD: 550 Ohm
TOT-0009: 0
TOT-0010: 7
TZAT-0001SLOWVT: 1
TZAT-0012SLOWVT: 200 ms
TZAT-0018SLOWVT: NEGATIVE
TZAT-0019SLOWVT: 7.5 V
TZAT-0020SLOWVT: 1 ms
TZON-0003SLOWVT: 300 ms
TZON-0004SLOWVT: 30
TZON-0005SLOWVT: 6
TZON-0010SLOWVT: 80 ms
TZST-0001SLOWVT: 4
TZST-0003SLOWVT: 36 J
VF: 0

## 2011-07-16 NOTE — Assessment & Plan Note (Signed)
stable °

## 2011-07-16 NOTE — Assessment & Plan Note (Signed)
The patient's device was interrogated.  The information was reviewed. No changes were made in the programming.    

## 2011-07-16 NOTE — Progress Notes (Signed)
HPI  Stacy Grimes is a 61 y.o. 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   Stable on current O2 therapy. Apparently her most recent ejection fraction is about 40%.   Past Medical History  Diagnosis Date  . Non-ischemic cardiomyopathy   . CHF (congestive heart failure)   . Dilated cardiomyopathy   . Chronic systolic heart failure   . Hypertension   . COPD (chronic obstructive pulmonary disease)   . Deafness   . AAA (abdominal aortic aneurysm)     Past Surgical History  Procedure Date  . Cardiac catheterization   . Cholecystectomy   . Bladder surgery   . Knee surgery     right  . Wrist surgery     right  . Middle ear surgery   . Cardiac defibrillator placement     Current Outpatient Prescriptions  Medication Sig Dispense Refill  . albuterol (PROVENTIL HFA) 108 (90 BASE) MCG/ACT inhaler Inhale 2 puffs into the lungs every 6 (six) hours as needed.        Marland Kitchen allopurinol (ZYLOPRIM) 100 MG tablet Take 100 mg by mouth daily.      Marland Kitchen ALPRAZolam (XANAX) 0.25 MG tablet Take 0.25 mg by mouth at bedtime as needed.        Marland Kitchen aspirin 81 MG EC tablet Take 81 mg by mouth daily.        . budesonide-formoterol (SYMBICORT) 160-4.5 MCG/ACT inhaler Inhale 2 puffs into the lungs daily.        . carvedilol (COREG) 25 MG tablet Take 25 mg by mouth as directed. Take 1/2 tablet in the morning and 1/2 tablet in the evening      . cyclobenzaprine (FLEXERIL) 10 MG tablet Take 10 mg by mouth 3 (three) times daily as needed.      . digoxin (LANOXIN) 0.125 MG tablet Take 125 mcg by mouth daily.        . furosemide (LASIX) 40 MG tablet       . HYDROcodone-acetaminophen (VICODIN) 5-500 MG per tablet Take 1 tablet by mouth every 6 (six) hours as needed.      Marland Kitchen ipratropium-albuterol (DUONEB) 0.5-2.5 (3) MG/3ML SOLN Take 3 mLs by nebulization every 4 (four) hours as needed.        Marland Kitchen levothyroxine (LEVOTHROID) 100 MCG  tablet Take 75 mcg by mouth daily.       Marland Kitchen losartan (COZAAR) 50 MG tablet Take 25 mg by mouth daily.      Marland Kitchen omeprazole (PRILOSEC) 20 MG capsule Take 20 mg by mouth daily.      . pantoprazole (PROTONIX) 40 MG tablet Take 40 mg by mouth daily.        . pramipexole (MIRAPEX) 0.25 MG tablet Take 0.25 mg by mouth at bedtime.        . roflumilast (DALIRESP) 500 MCG TABS tablet Take 500 mcg by mouth daily.      Marland Kitchen tiotropium (SPIRIVA) 18 MCG inhalation capsule Place 18 mcg into inhaler and inhale daily.        Marland Kitchen venlafaxine (EFFEXOR-XR) 37.5 MG 24 hr capsule Take 75 mg by mouth daily.       Marland Kitchen zolpidem (AMBIEN) 10 MG tablet Take 10 mg by mouth at bedtime as needed.        Marland Kitchen DISCONTD: furosemide (LASIX) 40 MG tablet TAKE ONE TABLET BY MOUTH TWICE DAILY  60 tablet  2  . DISCONTD: losartan (COZAAR) 50 MG  tablet Take 1 tablet (50 mg total) by mouth daily.  30 tablet  6    No Known Allergies  Review of Systems negative except from HPI and PMH  Physical Exam BP 110/76  Pulse 91  Ht 5\' 3"  (1.6 m)  Wt 205 lb 6.4 oz (93.169 kg)  BMI 36.39 kg/m2 Well developed and well nourished in no acute distress wearing oxygen HENT normal E scleral and icterus clear Neck Supple JVP flat; carotids brisk and full Clear to ausculation regular rate and rhythm, no murmurs gallops or rub Soft with active bowel sounds No clubbing cyanosis Trace Edema Alert and oriented, grossly normal motor and sensory function Skin Warm and Dry    Assessment and  Plan

## 2011-07-16 NOTE — Patient Instructions (Signed)

## 2011-10-03 DIAGNOSIS — G2581 Restless legs syndrome: Secondary | ICD-10-CM | POA: Diagnosis not present

## 2011-10-03 DIAGNOSIS — J449 Chronic obstructive pulmonary disease, unspecified: Secondary | ICD-10-CM | POA: Diagnosis not present

## 2011-10-03 DIAGNOSIS — L659 Nonscarring hair loss, unspecified: Secondary | ICD-10-CM | POA: Diagnosis not present

## 2011-10-03 DIAGNOSIS — M545 Low back pain: Secondary | ICD-10-CM | POA: Diagnosis not present

## 2011-10-06 ENCOUNTER — Other Ambulatory Visit: Payer: Self-pay | Admitting: Internal Medicine

## 2011-10-09 ENCOUNTER — Telehealth: Payer: Self-pay

## 2011-10-09 NOTE — Telephone Encounter (Signed)
See below

## 2011-10-09 NOTE — Telephone Encounter (Signed)
Pt's husband called, asks if ok for pt to sit in a Jacuzzi since she has pacemaker. I advised should be ok as long as she does not allow jets to cause vibration on pacemaker site/chest area.  He verb. Understanding and says she only needs this for her legs and will only submerge lower part of body.

## 2011-10-21 ENCOUNTER — Ambulatory Visit (INDEPENDENT_AMBULATORY_CARE_PROVIDER_SITE_OTHER): Payer: Medicare Other | Admitting: *Deleted

## 2011-10-21 ENCOUNTER — Encounter: Payer: Self-pay | Admitting: Internal Medicine

## 2011-10-21 DIAGNOSIS — Z95810 Presence of automatic (implantable) cardiac defibrillator: Secondary | ICD-10-CM

## 2011-10-21 DIAGNOSIS — I5022 Chronic systolic (congestive) heart failure: Secondary | ICD-10-CM | POA: Diagnosis not present

## 2011-10-21 DIAGNOSIS — I428 Other cardiomyopathies: Secondary | ICD-10-CM | POA: Diagnosis not present

## 2011-10-21 LAB — REMOTE ICD DEVICE
AL IMPEDENCE ICD: 360 Ohm
AL THRESHOLD: 0.875 V
BRDY-0003RA: 120 {beats}/min
BRDY-0004RA: 120 {beats}/min
DEVICE MODEL ICD: 605521
HV IMPEDENCE: 50 Ohm
LV LEAD IMPEDENCE ICD: 590 Ohm
MODE SWITCH EPISODES: 4
RV LEAD IMPEDENCE ICD: 580 Ohm
TZAT-0013SLOWVT: 3
TZAT-0019SLOWVT: 7.5 V
TZON-0005SLOWVT: 6
TZON-0010SLOWVT: 80 ms
TZST-0001SLOWVT: 3
TZST-0001SLOWVT: 5
TZST-0003SLOWVT: 36 J
VENTRICULAR PACING ICD: 98 pct

## 2011-10-25 DIAGNOSIS — M109 Gout, unspecified: Secondary | ICD-10-CM | POA: Diagnosis not present

## 2011-10-25 DIAGNOSIS — M79609 Pain in unspecified limb: Secondary | ICD-10-CM | POA: Diagnosis not present

## 2011-10-25 DIAGNOSIS — Z23 Encounter for immunization: Secondary | ICD-10-CM | POA: Diagnosis not present

## 2011-10-25 DIAGNOSIS — G2581 Restless legs syndrome: Secondary | ICD-10-CM | POA: Diagnosis not present

## 2011-11-13 ENCOUNTER — Encounter: Payer: Self-pay | Admitting: *Deleted

## 2011-11-25 DIAGNOSIS — E039 Hypothyroidism, unspecified: Secondary | ICD-10-CM | POA: Diagnosis not present

## 2011-11-25 DIAGNOSIS — I10 Essential (primary) hypertension: Secondary | ICD-10-CM | POA: Diagnosis not present

## 2011-11-25 DIAGNOSIS — M109 Gout, unspecified: Secondary | ICD-10-CM | POA: Diagnosis not present

## 2011-12-10 DIAGNOSIS — J449 Chronic obstructive pulmonary disease, unspecified: Secondary | ICD-10-CM | POA: Diagnosis not present

## 2011-12-10 DIAGNOSIS — M109 Gout, unspecified: Secondary | ICD-10-CM | POA: Diagnosis not present

## 2011-12-10 DIAGNOSIS — E7800 Pure hypercholesterolemia, unspecified: Secondary | ICD-10-CM | POA: Diagnosis not present

## 2011-12-10 DIAGNOSIS — R7309 Other abnormal glucose: Secondary | ICD-10-CM | POA: Diagnosis not present

## 2011-12-30 DIAGNOSIS — I10 Essential (primary) hypertension: Secondary | ICD-10-CM | POA: Diagnosis not present

## 2011-12-30 DIAGNOSIS — K1370 Unspecified lesions of oral mucosa: Secondary | ICD-10-CM | POA: Diagnosis not present

## 2011-12-30 DIAGNOSIS — J449 Chronic obstructive pulmonary disease, unspecified: Secondary | ICD-10-CM | POA: Diagnosis not present

## 2011-12-30 DIAGNOSIS — I509 Heart failure, unspecified: Secondary | ICD-10-CM | POA: Diagnosis not present

## 2011-12-30 DIAGNOSIS — R011 Cardiac murmur, unspecified: Secondary | ICD-10-CM | POA: Diagnosis not present

## 2011-12-30 DIAGNOSIS — I428 Other cardiomyopathies: Secondary | ICD-10-CM | POA: Diagnosis not present

## 2011-12-30 DIAGNOSIS — J438 Other emphysema: Secondary | ICD-10-CM | POA: Diagnosis not present

## 2012-01-27 ENCOUNTER — Encounter: Payer: Self-pay | Admitting: Internal Medicine

## 2012-01-27 ENCOUNTER — Ambulatory Visit (INDEPENDENT_AMBULATORY_CARE_PROVIDER_SITE_OTHER): Payer: Medicare Other | Admitting: *Deleted

## 2012-01-27 DIAGNOSIS — I428 Other cardiomyopathies: Secondary | ICD-10-CM

## 2012-01-27 DIAGNOSIS — Z95810 Presence of automatic (implantable) cardiac defibrillator: Secondary | ICD-10-CM

## 2012-01-27 DIAGNOSIS — I5022 Chronic systolic (congestive) heart failure: Secondary | ICD-10-CM

## 2012-01-27 LAB — REMOTE ICD DEVICE
AL AMPLITUDE: 5 mv
ATRIAL PACING ICD: 2.2 pct
BAMS-0001: 150 {beats}/min
BAMS-0003: 70 {beats}/min
BRDY-0003RA: 120 {beats}/min
DEVICE MODEL ICD: 605521
HV IMPEDENCE: 50 Ohm
RV LEAD IMPEDENCE ICD: 560 Ohm
TZAT-0001SLOWVT: 1
TZAT-0012SLOWVT: 200 ms
TZAT-0020SLOWVT: 1 ms
TZON-0005SLOWVT: 6
TZST-0001SLOWVT: 4
TZST-0003SLOWVT: 36 J
TZST-0003SLOWVT: 40 J

## 2012-02-06 ENCOUNTER — Encounter: Payer: Self-pay | Admitting: *Deleted

## 2012-02-11 DIAGNOSIS — M545 Low back pain: Secondary | ICD-10-CM | POA: Diagnosis not present

## 2012-02-11 DIAGNOSIS — M109 Gout, unspecified: Secondary | ICD-10-CM | POA: Diagnosis not present

## 2012-02-11 DIAGNOSIS — IMO0001 Reserved for inherently not codable concepts without codable children: Secondary | ICD-10-CM | POA: Diagnosis not present

## 2012-02-11 DIAGNOSIS — G609 Hereditary and idiopathic neuropathy, unspecified: Secondary | ICD-10-CM | POA: Diagnosis not present

## 2012-02-11 DIAGNOSIS — J441 Chronic obstructive pulmonary disease with (acute) exacerbation: Secondary | ICD-10-CM | POA: Diagnosis not present

## 2012-02-11 DIAGNOSIS — I10 Essential (primary) hypertension: Secondary | ICD-10-CM | POA: Diagnosis not present

## 2012-03-12 DIAGNOSIS — G609 Hereditary and idiopathic neuropathy, unspecified: Secondary | ICD-10-CM | POA: Diagnosis not present

## 2012-03-12 DIAGNOSIS — E7800 Pure hypercholesterolemia, unspecified: Secondary | ICD-10-CM | POA: Diagnosis not present

## 2012-03-12 DIAGNOSIS — F341 Dysthymic disorder: Secondary | ICD-10-CM | POA: Diagnosis not present

## 2012-03-20 DIAGNOSIS — M545 Low back pain: Secondary | ICD-10-CM | POA: Diagnosis not present

## 2012-03-28 ENCOUNTER — Observation Stay (HOSPITAL_COMMUNITY)
Admission: EM | Admit: 2012-03-28 | Discharge: 2012-03-30 | Disposition: A | Discharge status: Home / Self Care | Payer: Medicare Other | Attending: Internal Medicine | Admitting: Internal Medicine

## 2012-03-28 ENCOUNTER — Emergency Department (HOSPITAL_COMMUNITY): Payer: Medicare Other

## 2012-03-28 ENCOUNTER — Encounter (HOSPITAL_COMMUNITY): Payer: Self-pay | Admitting: Emergency Medicine

## 2012-03-28 DIAGNOSIS — N649 Disorder of breast, unspecified: Secondary | ICD-10-CM | POA: Diagnosis not present

## 2012-03-28 DIAGNOSIS — K5730 Diverticulosis of large intestine without perforation or abscess without bleeding: Secondary | ICD-10-CM | POA: Insufficient documentation

## 2012-03-28 DIAGNOSIS — Z7982 Long term (current) use of aspirin: Secondary | ICD-10-CM | POA: Diagnosis not present

## 2012-03-28 DIAGNOSIS — G4733 Obstructive sleep apnea (adult) (pediatric): Secondary | ICD-10-CM | POA: Diagnosis not present

## 2012-03-28 DIAGNOSIS — E669 Obesity, unspecified: Secondary | ICD-10-CM | POA: Diagnosis not present

## 2012-03-28 DIAGNOSIS — I428 Other cardiomyopathies: Secondary | ICD-10-CM

## 2012-03-28 DIAGNOSIS — H9190 Unspecified hearing loss, unspecified ear: Secondary | ICD-10-CM | POA: Diagnosis not present

## 2012-03-28 DIAGNOSIS — R110 Nausea: Secondary | ICD-10-CM | POA: Insufficient documentation

## 2012-03-28 DIAGNOSIS — K859 Acute pancreatitis without necrosis or infection, unspecified: Secondary | ICD-10-CM

## 2012-03-28 DIAGNOSIS — I5022 Chronic systolic (congestive) heart failure: Secondary | ICD-10-CM

## 2012-03-28 DIAGNOSIS — R109 Unspecified abdominal pain: Principal | ICD-10-CM | POA: Insufficient documentation

## 2012-03-28 DIAGNOSIS — R059 Cough, unspecified: Secondary | ICD-10-CM

## 2012-03-28 DIAGNOSIS — E876 Hypokalemia: Secondary | ICD-10-CM | POA: Insufficient documentation

## 2012-03-28 DIAGNOSIS — Z9089 Acquired absence of other organs: Secondary | ICD-10-CM | POA: Insufficient documentation

## 2012-03-28 DIAGNOSIS — X58XXXA Exposure to other specified factors, initial encounter: Secondary | ICD-10-CM | POA: Insufficient documentation

## 2012-03-28 DIAGNOSIS — M545 Low back pain, unspecified: Secondary | ICD-10-CM | POA: Insufficient documentation

## 2012-03-28 DIAGNOSIS — Z95810 Presence of automatic (implantable) cardiac defibrillator: Secondary | ICD-10-CM

## 2012-03-28 DIAGNOSIS — R002 Palpitations: Secondary | ICD-10-CM

## 2012-03-28 DIAGNOSIS — R05 Cough: Secondary | ICD-10-CM | POA: Insufficient documentation

## 2012-03-28 DIAGNOSIS — J449 Chronic obstructive pulmonary disease, unspecified: Secondary | ICD-10-CM | POA: Insufficient documentation

## 2012-03-28 DIAGNOSIS — I10 Essential (primary) hypertension: Secondary | ICD-10-CM

## 2012-03-28 DIAGNOSIS — S32009A Unspecified fracture of unspecified lumbar vertebra, initial encounter for closed fracture: Secondary | ICD-10-CM | POA: Insufficient documentation

## 2012-03-28 DIAGNOSIS — R011 Cardiac murmur, unspecified: Secondary | ICD-10-CM | POA: Diagnosis not present

## 2012-03-28 DIAGNOSIS — K7689 Other specified diseases of liver: Secondary | ICD-10-CM | POA: Diagnosis not present

## 2012-03-28 DIAGNOSIS — Z6837 Body mass index (BMI) 37.0-37.9, adult: Secondary | ICD-10-CM | POA: Insufficient documentation

## 2012-03-28 DIAGNOSIS — J4489 Other specified chronic obstructive pulmonary disease: Secondary | ICD-10-CM | POA: Insufficient documentation

## 2012-03-28 DIAGNOSIS — Z79899 Other long term (current) drug therapy: Secondary | ICD-10-CM | POA: Insufficient documentation

## 2012-03-28 LAB — URINALYSIS, ROUTINE W REFLEX MICROSCOPIC
Bilirubin Urine: NEGATIVE
Glucose, UA: NEGATIVE mg/dL
Hgb urine dipstick: NEGATIVE
Specific Gravity, Urine: 1.013 (ref 1.005–1.030)
pH: 7 (ref 5.0–8.0)

## 2012-03-28 LAB — COMPREHENSIVE METABOLIC PANEL
ALT: 18 U/L (ref 0–35)
AST: 20 U/L (ref 0–37)
Albumin: 3.6 g/dL (ref 3.5–5.2)
Calcium: 9.3 mg/dL (ref 8.4–10.5)
GFR calc Af Amer: >90 mL/min (ref >90)
Glucose, Bld: 111 mg/dL — ABNORMAL HIGH (ref 70–99)
Sodium: 137 mEq/L (ref 135–145)
Total Protein: 8.6 g/dL — ABNORMAL HIGH (ref 6.0–8.3)

## 2012-03-28 LAB — CBC WITH DIFFERENTIAL/PLATELET
Basophils Absolute: 0.1 10*3/uL (ref 0.0–0.1)
Basophils Relative: 1 % (ref 0–1)
Eosinophils Absolute: 0.2 10*3/uL (ref 0.0–0.7)
Eosinophils Relative: 2 % (ref 0–5)
MCH: 28.2 pg (ref 26.0–34.0)
MCHC: 31.9 g/dL (ref 30.0–36.0)
MCV: 88.3 fL (ref 78.0–100.0)
Platelets: 355 10*3/uL (ref 150–400)
RDW: 15.8 % — ABNORMAL HIGH (ref 11.5–15.5)

## 2012-03-28 MED ORDER — LEVOTHYROXINE SODIUM 88 MCG PO TABS
88.0000 ug | ORAL_TABLET | Freq: Every day | ORAL | Status: DC
  Administered 2012-03-29 – 2012-03-30 (×2): 88 ug via ORAL
  Filled 2012-03-28 (×3): qty 1

## 2012-03-28 MED ORDER — SODIUM CHLORIDE 0.9 % IJ SOLN
3.0000 mL | Freq: Two times a day (BID) | INTRAMUSCULAR | Status: DC
  Administered 2012-03-29 (×2): 3 mL via INTRAVENOUS

## 2012-03-28 MED ORDER — BUDESONIDE-FORMOTEROL FUMARATE 160-4.5 MCG/ACT IN AERO
2.0000 | INHALATION_SPRAY | Freq: Every day | RESPIRATORY_TRACT | Status: DC
  Administered 2012-03-28 – 2012-03-30 (×3): 2 via RESPIRATORY_TRACT
  Filled 2012-03-28: qty 6

## 2012-03-28 MED ORDER — GABAPENTIN 600 MG PO TABS
600.0000 mg | ORAL_TABLET | Freq: Two times a day (BID) | ORAL | Status: DC
  Filled 2012-03-28: qty 1

## 2012-03-28 MED ORDER — HYDROCODONE-ACETAMINOPHEN 5-500 MG PO TABS: 1.0000 | ORAL_TABLET | Freq: Four times a day (QID) | ORAL | Status: DC | PRN

## 2012-03-28 MED ORDER — ALBUTEROL SULFATE (5 MG/ML) 0.5% IN NEBU: 2.5000 mg | INHALATION_SOLUTION | RESPIRATORY_TRACT | Status: DC | PRN

## 2012-03-28 MED ORDER — DIGOXIN 125 MCG PO TABS
125.0000 ug | ORAL_TABLET | Freq: Every day | ORAL | Status: DC
  Administered 2012-03-29 – 2012-03-30 (×2): 125 ug via ORAL
  Filled 2012-03-28 (×2): qty 1

## 2012-03-28 MED ORDER — CYCLOBENZAPRINE HCL 10 MG PO TABS
10.0000 mg | ORAL_TABLET | Freq: Three times a day (TID) | ORAL | Status: DC | PRN
  Administered 2012-03-29: 10 mg via ORAL
  Filled 2012-03-28: qty 1

## 2012-03-28 MED ORDER — SODIUM CHLORIDE 0.9 % IV SOLN: 250.0000 mL | INTRAVENOUS | Status: DC | PRN

## 2012-03-28 MED ORDER — HEPARIN SODIUM (PORCINE) 5000 UNIT/ML IJ SOLN
5000.0000 [IU] | Freq: Three times a day (TID) | INTRAMUSCULAR | Status: DC
  Administered 2012-03-29 – 2012-03-30 (×6): 5000 [IU] via SUBCUTANEOUS
  Filled 2012-03-28 (×8): qty 1

## 2012-03-28 MED ORDER — CARVEDILOL 12.5 MG PO TABS
12.5000 mg | ORAL_TABLET | Freq: Two times a day (BID) | ORAL | Status: DC
  Administered 2012-03-29 – 2012-03-30 (×3): 12.5 mg via ORAL
  Filled 2012-03-28 (×5): qty 1

## 2012-03-28 MED ORDER — PANTOPRAZOLE SODIUM 40 MG PO TBEC
40.0000 mg | DELAYED_RELEASE_TABLET | Freq: Every day | ORAL | Status: DC
  Administered 2012-03-28 – 2012-03-30 (×3): 40 mg via ORAL
  Filled 2012-03-28 (×3): qty 1

## 2012-03-28 MED ORDER — HYDROCODONE-ACETAMINOPHEN 5-325 MG PO TABS: 2.0000 | ORAL_TABLET | Freq: Once | ORAL | Status: DC

## 2012-03-28 MED ORDER — SODIUM CHLORIDE 0.9 % IJ SOLN: 3.0000 mL | INTRAMUSCULAR | Status: DC | PRN

## 2012-03-28 MED ORDER — DOCUSATE SODIUM 100 MG PO CAPS
100.0000 mg | ORAL_CAPSULE | Freq: Two times a day (BID) | ORAL | Status: DC
  Administered 2012-03-29 – 2012-03-30 (×4): 100 mg via ORAL
  Filled 2012-03-28 (×5): qty 1

## 2012-03-28 MED ORDER — HYDROCODONE-ACETAMINOPHEN 5-325 MG PO TABS
1.0000 | ORAL_TABLET | Freq: Four times a day (QID) | ORAL | Status: DC | PRN
  Administered 2012-03-29: 1 via ORAL
  Filled 2012-03-28: qty 1

## 2012-03-28 MED ORDER — CIPROFLOXACIN HCL 500 MG PO TABS
500.0000 mg | ORAL_TABLET | Freq: Two times a day (BID) | ORAL | Status: DC
  Administered 2012-03-28 – 2012-03-30 (×4): 500 mg via ORAL
  Filled 2012-03-28 (×6): qty 1

## 2012-03-28 MED ORDER — HYDROMORPHONE HCL PF 1 MG/ML IJ SOLN
1.0000 mg | Freq: Once | INTRAMUSCULAR | Status: AC
  Administered 2012-03-28: 1 mg via INTRAVENOUS
  Filled 2012-03-28: qty 1

## 2012-03-28 MED ORDER — VENLAFAXINE HCL ER 75 MG PO CP24
75.0000 mg | ORAL_CAPSULE | Freq: Every day | ORAL | Status: DC
  Administered 2012-03-29 – 2012-03-30 (×2): 75 mg via ORAL
  Filled 2012-03-28 (×2): qty 1

## 2012-03-28 MED ORDER — ALPRAZOLAM 0.25 MG PO TABS
0.2500 mg | ORAL_TABLET | Freq: Every evening | ORAL | Status: DC | PRN
  Administered 2012-03-29: 0.25 mg via ORAL
  Filled 2012-03-28: qty 1

## 2012-03-28 MED ORDER — FUROSEMIDE 40 MG PO TABS
40.0000 mg | ORAL_TABLET | Freq: Two times a day (BID) | ORAL | Status: DC
  Administered 2012-03-29 – 2012-03-30 (×4): 40 mg via ORAL
  Filled 2012-03-28 (×6): qty 1

## 2012-03-28 MED ORDER — SODIUM CHLORIDE 0.9 % IJ SOLN
3.0000 mL | Freq: Two times a day (BID) | INTRAMUSCULAR | Status: DC
  Administered 2012-03-28: 3 mL via INTRAVENOUS

## 2012-03-28 MED ORDER — LOSARTAN POTASSIUM 25 MG PO TABS
25.0000 mg | ORAL_TABLET | Freq: Every day | ORAL | Status: DC
  Administered 2012-03-29 – 2012-03-30 (×2): 25 mg via ORAL
  Filled 2012-03-28 (×2): qty 1

## 2012-03-28 MED ORDER — GABAPENTIN 300 MG PO CAPS
600.0000 mg | ORAL_CAPSULE | Freq: Two times a day (BID) | ORAL | Status: DC
  Administered 2012-03-29 – 2012-03-30 (×4): 600 mg via ORAL
  Filled 2012-03-28 (×5): qty 2

## 2012-03-28 MED ORDER — IPRATROPIUM BROMIDE 0.02 % IN SOLN: 0.5000 mg | RESPIRATORY_TRACT | Status: DC | PRN

## 2012-03-28 MED ORDER — WHITE PETROLATUM GEL
Status: AC
  Administered 2012-03-28: 16:00:00
  Filled 2012-03-28: qty 5

## 2012-03-28 MED ORDER — ALLOPURINOL 300 MG PO TABS
450.0000 mg | ORAL_TABLET | Freq: Every day | ORAL | Status: DC
  Administered 2012-03-29 – 2012-03-30 (×2): 450 mg via ORAL
  Filled 2012-03-28 (×2): qty 1

## 2012-03-28 MED ORDER — HYDROMORPHONE HCL PF 1 MG/ML IJ SOLN
1.0000 mg | INTRAMUSCULAR | Status: DC | PRN
  Administered 2012-03-29 (×2): 1 mg via INTRAVENOUS
  Filled 2012-03-28 (×2): qty 1

## 2012-03-28 MED ORDER — ZOLPIDEM TARTRATE 5 MG PO TABS: 5.0000 mg | ORAL_TABLET | Freq: Every evening | ORAL | Status: DC | PRN

## 2012-03-28 MED ORDER — TIOTROPIUM BROMIDE MONOHYDRATE 18 MCG IN CAPS
18.0000 ug | ORAL_CAPSULE | Freq: Every day | RESPIRATORY_TRACT | Status: DC
  Administered 2012-03-29 – 2012-03-30 (×2): 18 ug via RESPIRATORY_TRACT
  Filled 2012-03-28: qty 5

## 2012-03-28 MED ORDER — ROFLUMILAST 500 MCG PO TABS
500.0000 ug | ORAL_TABLET | Freq: Every day | ORAL | Status: DC
  Administered 2012-03-29 – 2012-03-30 (×2): 500 ug via ORAL
  Filled 2012-03-28 (×2): qty 1

## 2012-03-28 MED ORDER — FENTANYL CITRATE 0.05 MG/ML IJ SOLN
50.0000 ug | Freq: Once | INTRAMUSCULAR | Status: AC
  Administered 2012-03-28: 50 ug via INTRAVENOUS
  Filled 2012-03-28: qty 2

## 2012-03-28 MED ORDER — ALBUTEROL SULFATE HFA 108 (90 BASE) MCG/ACT IN AERS: 2.0000 | INHALATION_SPRAY | Freq: Four times a day (QID) | RESPIRATORY_TRACT | Status: DC | PRN

## 2012-03-28 MED ORDER — ZOLPIDEM TARTRATE 5 MG PO TABS: 10.0000 mg | ORAL_TABLET | Freq: Every evening | ORAL | Status: DC | PRN

## 2012-03-28 MED ORDER — CARVEDILOL 25 MG PO TABS: 25.0000 mg | ORAL_TABLET | ORAL | Status: DC

## 2012-03-28 MED ORDER — IPRATROPIUM-ALBUTEROL 0.5-2.5 (3) MG/3ML IN SOLN: 3.0000 mL | RESPIRATORY_TRACT | Status: DC | PRN

## 2012-03-28 MED ORDER — ASPIRIN EC 81 MG PO TBEC
81.0000 mg | DELAYED_RELEASE_TABLET | Freq: Every day | ORAL | Status: DC
  Administered 2012-03-29 – 2012-03-30 (×2): 81 mg via ORAL
  Filled 2012-03-28 (×2): qty 1

## 2012-03-28 MED ORDER — METRONIDAZOLE 500 MG PO TABS
500.0000 mg | ORAL_TABLET | Freq: Three times a day (TID) | ORAL | Status: DC
  Administered 2012-03-29 – 2012-03-30 (×6): 500 mg via ORAL
  Filled 2012-03-28 (×8): qty 1

## 2012-03-28 NOTE — Progress Notes (Signed)
Patient refuses CPAP at this time. She states that she does not wear at home and does not wish for it here. RT encouraged her to call if she changed her mind.

## 2012-03-28 NOTE — ED Notes (Signed)
Pt presents w/ LLQ pain, onset 2 days ago as left flank pain Denies nausea, vomiting or diarrhea. Denies urinary Sx or hematuria

## 2012-03-28 NOTE — H&P (Signed)
Hospitalist Admission History and Physical  Patient name: Stacy Grimes Medical record number: [id number] Date of birth: April 19, 1950 Age: 62 y.o. Gender: female  Primary Care Provider: Lonie Peak, [place]  Chief Complaint: L flank/LLQ pain  History of Present Illness:This is a 62 y.o. year old female with multiple medical problems including hypertension, chronic systolic heart failure status post biventricular defibrillator, COPD on 3 1/2 L at home, hypertension, history of AAA, and diverticulosis presenting with left flank/left lower quadrant pain x2 days. patient gets her care in [place], [place].  Patient reports having sudden onset of severe left flank/left lower quadrant pain since yesterday. Patient is unsure source of pain. Patient states she noticed sudden onset sharp left flank pain that radiated to the left lower quadrant while at home. Pain seems to be worse with movement. Patient denies any recent strenuous activity or overexertion. Pain is 10 out of 10 at its worse. Pain seems to be relieved with rest per patient. No true dysuria though patient does report one episode of mild burning with urination today per patient.  no hematuria the Patient does report some mild nausea with onset of symptoms though patient states that she ate last night (canned meat) with no worsening of flank/ abdominal pain associated with food. Patient with chronic constipation. There has been no change in bowel habits. Patient denies any chest pains shortness of breath. Has been stable on home oxygen regimen. Patient reports prior history of diverticulitis flares in the past. Patient states that symptoms are not necessarily similar to previous flares in the past though she cannot completely rule this out. No chest pains or shortness of breath. No diaphoresis. No recent illnesses per patient.   In the ED general blood work and urinalysis was obtained which was essentially within normal limits. Patient was noted to have an  elevated lipase at 125. A CT scan of the abdomen and pelvis was obtained that was negative for any kidney stones, urinary tract obstruction. There was significant diverticulosis however no imaging consistent with diverticulitis.  Patient Active Problem List  Diagnosis  . SLEEP APNEA, OBSTRUCTIVE  . HYPERTENSION, BENIGN  . CARDIOMYOPATHY, PRIMARY, DILATED  . SYSTOLIC HEART FAILURE, CHRONIC  . PALPITATIONS  . MURMUR  . COUGH  . Biventricular implantable cardiac defibrillator-[employer]   Past Medical History: Past Medical History  Diagnosis Date  . Non-ischemic cardiomyopathy   . CHF (congestive heart failure)   . Dilated cardiomyopathy   . Chronic systolic heart failure   . Hypertension   . COPD (chronic obstructive pulmonary disease)   . Deafness   . AAA (abdominal aortic aneurysm)     Past Surgical History: Past Surgical History  Procedure Laterality Date  . Cholecystectomy    . Bladder surgery    . Knee surgery      right  . Wrist surgery      right  . Middle ear surgery    . Cardiac catheterization    . Cardiac defibrillator placement      pacemaker  . Abdominal hysterectomy      Social History: History   Social History  . Marital Status: Married    Spouse Name: N/A    Number of Children: N/A  . Years of Education: N/A   Social History Main Topics  . Smoking status: Former Smoker    Types: Cigarettes    Quit date: 02/04/2002  . Smokeless tobacco: Never Used  . Alcohol Use: No  . Drug Use: No  . Sexually Active: Not  on file   Other Topics Concern  . Not on file   Social History Narrative  . No narrative on file    Family History: Family History  Problem Relation Age of Onset  . Hypertension Mother   . Breast cancer Mother   . Stroke Mother   . Heart disease Father   . Heart attack Sister   . Hypertension Brother   . Heart attack Sister     Allergies: No Known Allergies  No current facility-administered medications for this encounter.    Current Outpatient Prescriptions  Medication Sig Dispense Refill  . albuterol (PROVENTIL HFA) 108 (90 BASE) MCG/ACT inhaler Inhale 2 puffs into the lungs every 6 (six) hours as needed.        Marland Kitchen allopurinol (ZYLOPRIM) 300 MG tablet Take 450 mg by mouth daily.      Marland Kitchen ALPRAZolam (XANAX) 0.25 MG tablet Take 0.25 mg by mouth at bedtime as needed for anxiety.       Marland Kitchen aspirin 81 MG EC tablet Take 81 mg by mouth daily.        . budesonide-formoterol (SYMBICORT) 160-4.5 MCG/ACT inhaler Inhale 2 puffs into the lungs daily.        . carvedilol (COREG) 25 MG tablet Take 25 mg by mouth as directed. Take 1/2 tablet in the morning and 1/2 tablet in the evening      . cyclobenzaprine (FLEXERIL) 10 MG tablet Take 10 mg by mouth 3 (three) times daily as needed for muscle spasms.       . digoxin (LANOXIN) 0.125 MG tablet Take 125 mcg by mouth daily.        . furosemide (LASIX) 40 MG tablet Take 40 mg by mouth 2 (two) times daily.       Marland Kitchen gabapentin (NEURONTIN) 600 MG tablet Take 600 mg by mouth 2 (two) times daily.      Marland Kitchen HYDROcodone-acetaminophen (VICODIN) 5-500 MG per tablet Take 1 tablet by mouth every 6 (six) hours as needed.      Marland Kitchen ipratropium-albuterol (DUONEB) 0.5-2.5 (3) MG/3ML SOLN Take 3 mLs by nebulization every 4 (four) hours as needed (for wheezing).       Marland Kitchen levothyroxine (SYNTHROID, LEVOTHROID) 88 MCG tablet Take 88 mcg by mouth daily.      Marland Kitchen losartan (COZAAR) 50 MG tablet Take 25 mg by mouth daily.      Marland Kitchen omeprazole (PRILOSEC) 20 MG capsule Take 20 mg by mouth daily.      Marland Kitchen tiotropium (SPIRIVA) 18 MCG inhalation capsule Place 18 mcg into inhaler and inhale daily.        Marland Kitchen venlafaxine (EFFEXOR-XR) 37.5 MG 24 hr capsule Take 75 mg by mouth daily.       Marland Kitchen zolpidem (AMBIEN) 10 MG tablet Take 10 mg by mouth at bedtime as needed for sleep.       . roflumilast (DALIRESP) 500 MCG TABS tablet Take 500 mcg by mouth daily.       Review Of Systems: 12 point ROS negative except as noted above in  HPI.  Physical Exam: Filed Vitals:   03/28/12 1930  BP: 97/48  Pulse: 94  Temp:   Resp: 18    General: alert, cooperative and moderately obese HEENT: PERRLA and extra ocular movement intact Heart: S1, S2 normal, no murmur, rub or gallop, regular rate and rhythm Lungs: no wheezes or rales and unlabored breathing Abdomen: large abdominal girth, normal bowel sounds, + L flank pain and + LLQ TTP. No suprapubic tenderness  Extremities: extremities normal, atraumatic, no cyanosis or edema Skin:no rashes, no ecchymoses Neurology: normal without focal findings and mental status, speech normal, alert and oriented x3  Labs and Imaging: Lab Results  Component Value Date/Time   NA 137 03/28/2012  3:28 PM   K 3.9 03/28/2012  3:28 PM   CL 95* 03/28/2012  3:28 PM   CO2 31 03/28/2012  3:28 PM   BUN 19 03/28/2012  3:28 PM   CREATININE 0.76 03/28/2012  3:28 PM   GLUCOSE 111* 03/28/2012  3:28 PM   Lab Results  Component Value Date   WBC 9.5 03/28/2012   HGB 11.8* 03/28/2012   HCT 37.0 03/28/2012   MCV 88.3 03/28/2012   PLT 355 03/28/2012   Ct Abdomen Pelvis Wo Contrast  03/28/2012  *RADIOLOGY REPORT*  Clinical Data: Left lower back pain radiating to the left groin.  CT ABDOMEN AND PELVIS WITHOUT CONTRAST  Technique:  Multidetector CT imaging of the abdomen and pelvis was performed following the standard protocol without intravenous contrast.  Comparison: No priors.  Findings:  Lung Bases: AICD lead terminating in the right ventricular apex. Additional lead appears to extend into the coronary sinus and coronary veins overlying the lateral wall of the left ventricle.  Abdomen/Pelvis:  There are no abnormal calcifications within the collecting system of either kidney, along the course of either ureter, or within the lumen of the urinary bladder.  No hydroureteronephrosis to suggest urinary tract obstruction at this time.  Status post cholecystectomy.  Diffusely decreased attenuation throughout the hepatic  parenchyma, compatible with hepatic steatosis.  No focal hepatic lesions are noted.  The unenhanced appearance of the pancreas, spleen and bilateral adrenal glands is unremarkable.  There is extensive atherosclerosis throughout the abdominal and pelvic vasculature, without definite aneurysm. Extensive colonic diverticulosis is noted, particularly throughout the distal descending colon and sigmoid colon, without surrounding inflammatory changes to suggest acute diverticulitis at this time. The appendix is not confidently identified, and may be surgically absent.  Regardless, there are no inflammatory changes adjacent to the cecum to suggest an acute appendicitis at this time.  No significant volume of ascites.  No pneumoperitoneum.  No pathologic distension of small bowel.  Ovaries are atrophic but otherwise unremarkable in appearance.  Status post hysterectomy.  Urinary bladder is normal in appearance.  No definite pathologic lymphadenopathy identified within the abdomen or pelvis on today's noncontrast CT examination.  Musculoskeletal: There are no aggressive appearing lytic or blastic lesions noted in the visualized portions of the skeleton.  There is a compression fracture of a L1 with approximately 40% loss of anterior vertebral body height which appears to be a chronic injury. 1.5 cm well circumscribed smoothly marginated soft tissue attenuation lesion in the inferior aspect of the left breast (image 4 of series 2).  IMPRESSION: 1.  No acute findings in the abdomen or pelvis to account for the patient's symptoms.  Specifically, no abnormal urinary tract calculi or findings of urinary tract obstruction.  2.  Extensive colonic diverticulosis, without findings to suggest acute diverticulitis at this time. 3.  Status post cholecystectomy and hysterectomy. 4.  Hepatic steatosis. 5.  1.5 cm soft tissue attenuation lesion in the inferior aspect of the left breast is nonspecific.  Correlation with mammography is  recommended.   Original Report Authenticated By: Trudie Reed, M.D.       Assessment and Plan: DEBBY CLYNE is a 62 y.o. year old female  with multiple medical problems presenting with left flank/left lower quadrant  pain.  Left flank/left lower quadrant pain: Differential seems relatively broad for symptoms. Pain seems to be musculoskeletal in origin as pain seems to worsen with movement.  CT scan negative for any kidney stones or urinary obstruction. Patient does have a elevated lipase without evidence of pancreatitis on CT scan. History and exam is not clinically consistent with classic pancreatitis.  No leukocytosis. Afebrile. Vital signs stable. Will admit patient for general observation will plan to treat for pain control and very gentle hydration the setting of cardiomyopathy status post ICD. Will also place patient on course of Cipro and Flagyl as this would give good coverage for UTI as well as diverticulitis. Will obtain urine culture. I'm checking a stat lactate as patient does have some ischemic bowel risk factors. Will also check lipid panel given elevated lipase.  Cardiovascular: Clinically at baseline. No signs of volume overload. No chest pain currently. Continue home medication regimen. Consider holding diuretic if pressures drop outside of normal range. Will place on telemetry while in house.  Respiratory: Also at baseline. Continue home COPD regimen.   FEN/GI:  advance diet as tolerated. Early enteral feeding. Saline lock IV with encouraged oral fluids in setting of cardiomyopathy. PPI.  Prophylaxis:  subcutaneous heparin.  Disposition:  pending further evaluation  Code Status: Full Code         Doree Albee MD Pager: [phone]

## 2012-03-28 NOTE — ED Provider Notes (Signed)
History     CSN: [id number]  Arrival date & time 03/28/12  1401   First MD Initiated Contact with Patient 03/28/12 1609      Chief Complaint  Patient presents with  . Abdominal Pain    (Consider location/radiation/quality/duration/timing/severity/associated sxs/prior treatment) HPI Presents with left lower back pain radiating to left groin onset 2 days ago worse with movement improved with remaining still denies abdominal pain treated with hydrocodone, 1 tablet every 6 hours without relief patient pain-free since treated with fentanyl prior to my exam in the emergency department. No shortness of breath no loss of bladder or bowel control no fever no other complaint Past Medical History  Diagnosis Date  . Non-ischemic cardiomyopathy   . CHF (congestive heart failure)   . Dilated cardiomyopathy   . Chronic systolic heart failure   . Hypertension   . COPD (chronic obstructive pulmonary disease)   . Deafness   . AAA (abdominal aortic aneurysm)     Past Surgical History  Procedure Laterality Date  . Cholecystectomy    . Bladder surgery    . Knee surgery      right  . Wrist surgery      right  . Middle ear surgery    . Cardiac catheterization    . Cardiac defibrillator placement      pacemaker  . Abdominal hysterectomy      Family History  Problem Relation Age of Onset  . Hypertension Mother   . Breast cancer Mother   . Stroke Mother   . Heart disease Father   . Heart attack Sister   . Hypertension Brother   . Heart attack Sister     History  Substance Use Topics  . Smoking status: Former Smoker    Types: Cigarettes    Quit date: 02/04/2002  . Smokeless tobacco: Never Used  . Alcohol Use: No    OB History   Grav Para Term Preterm Abortions TAB SAB Ect Mult Living                  Review of Systems  Constitutional: Negative.   HENT: Negative.   Respiratory: Negative.   Cardiovascular: Negative.   Gastrointestinal: Negative.   Musculoskeletal:  Positive for back pain.  Skin: Negative.   Neurological: Negative.   Psychiatric/Behavioral: Negative.   All other systems reviewed and are negative.    Allergies  Review of patient's allergies indicates no known allergies.  Home Medications   Current Outpatient Rx  Name  Route  Sig  Dispense  Refill  . albuterol (PROVENTIL HFA) 108 (90 BASE) MCG/ACT inhaler   Inhalation   Inhale 2 puffs into the lungs every 6 (six) hours as needed.           Marland Kitchen allopurinol (ZYLOPRIM) 300 MG tablet   Oral   Take 450 mg by mouth daily.         Marland Kitchen ALPRAZolam (XANAX) 0.25 MG tablet   Oral   Take 0.25 mg by mouth at bedtime as needed for anxiety.          Marland Kitchen aspirin 81 MG EC tablet   Oral   Take 81 mg by mouth daily.           . budesonide-formoterol (SYMBICORT) 160-4.5 MCG/ACT inhaler   Inhalation   Inhale 2 puffs into the lungs daily.           . carvedilol (COREG) 25 MG tablet   Oral   Take 25 mg by  mouth as directed. Take 1/2 tablet in the morning and 1/2 tablet in the evening         . cyclobenzaprine (FLEXERIL) 10 MG tablet   Oral   Take 10 mg by mouth 3 (three) times daily as needed for muscle spasms.          . digoxin (LANOXIN) 0.125 MG tablet   Oral   Take 125 mcg by mouth daily.           . furosemide (LASIX) 40 MG tablet   Oral   Take 40 mg by mouth 2 (two) times daily.          Marland Kitchen gabapentin (NEURONTIN) 600 MG tablet   Oral   Take 600 mg by mouth 2 (two) times daily.         Marland Kitchen HYDROcodone-acetaminophen (VICODIN) 5-500 MG per tablet   Oral   Take 1 tablet by mouth every 6 (six) hours as needed.         Marland Kitchen ipratropium-albuterol (DUONEB) 0.5-2.5 (3) MG/3ML SOLN   Nebulization   Take 3 mLs by nebulization every 4 (four) hours as needed (for wheezing).          Marland Kitchen levothyroxine (SYNTHROID, LEVOTHROID) 88 MCG tablet   Oral   Take 88 mcg by mouth daily.         Marland Kitchen losartan (COZAAR) 50 MG tablet   Oral   Take 25 mg by mouth daily.         Marland Kitchen  omeprazole (PRILOSEC) 20 MG capsule   Oral   Take 20 mg by mouth daily.         Marland Kitchen tiotropium (SPIRIVA) 18 MCG inhalation capsule   Inhalation   Place 18 mcg into inhaler and inhale daily.           Marland Kitchen venlafaxine (EFFEXOR-XR) 37.5 MG 24 hr capsule   Oral   Take 75 mg by mouth daily.          Marland Kitchen zolpidem (AMBIEN) 10 MG tablet   Oral   Take 10 mg by mouth at bedtime as needed for sleep.          . roflumilast (DALIRESP) 500 MCG TABS tablet   Oral   Take 500 mcg by mouth daily.           BP 138/51  Pulse 82  Temp(Src) 98.2 F (36.8 C) (Oral)  Resp 18  SpO2 97%  Physical Exam  Nursing note and vitals reviewed. Constitutional: No distress.  Chronically ill-appearing  HENT:  Head: Normocephalic and atraumatic.  Eyes: Conjunctivae are normal. Pupils are equal, round, and reactive to light.  Neck: Neck supple. No tracheal deviation present. No thyromegaly present.  Cardiovascular: Normal rate and regular rhythm.   No murmur heard. Pulmonary/Chest: Effort normal and breath sounds normal.  Abdominal: Soft. Bowel sounds are normal. She exhibits no distension and no mass. There is no tenderness. There is no guarding.  Obese  Musculoskeletal: Normal range of motion. She exhibits no edema and no tenderness.  Left-sided paralumbar pain exacerbated by standing from a seated position at no point tenderness along spine  Neurological: She is alert. Coordination normal.  Gait normal not lightheaded on standing  Skin: Skin is warm and dry. No rash noted.  Psychiatric: She has a normal mood and affect.    ED Course  Procedures (including critical care time)  Labs Reviewed  CBC WITH DIFFERENTIAL - Abnormal; Notable for the following:    Hemoglobin 11.8 (*)  RDW 15.8 (*)    All other components within normal limits  COMPREHENSIVE METABOLIC PANEL - Abnormal; Notable for the following:    Chloride 95 (*)    Glucose, Bld 111 (*)    Total Protein 8.6 (*)    Total Bilirubin  0.2 (*)    GFR calc non Af Amer 89 (*)    All other components within normal limits  LIPASE, BLOOD - Abnormal; Notable for the following:    Lipase 125 (*)    All other components within normal limits  URINALYSIS, ROUTINE W REFLEX MICROSCOPIC   No results found.  Results for orders placed during the hospital encounter of 03/28/12  URINALYSIS, ROUTINE W REFLEX MICROSCOPIC      Result Value Range   Color, Urine YELLOW  YELLOW   APPearance CLEAR  CLEAR   Specific Gravity, Urine 1.013  1.005 - 1.030   pH 7.0  5.0 - 8.0   Glucose, UA NEGATIVE  NEGATIVE mg/dL   Hgb urine dipstick NEGATIVE  NEGATIVE   Bilirubin Urine NEGATIVE  NEGATIVE   Ketones, ur NEGATIVE  NEGATIVE mg/dL   Protein, ur NEGATIVE  NEGATIVE mg/dL   Urobilinogen, UA 0.2  0.0 - 1.0 mg/dL   Nitrite NEGATIVE  NEGATIVE   Leukocytes, UA NEGATIVE  NEGATIVE  CBC WITH DIFFERENTIAL      Result Value Range   WBC 9.5  4.0 - 10.5 K/uL   RBC 4.19  3.87 - 5.11 MIL/uL   Hemoglobin 11.8 (*) 12.0 - 15.0 g/dL   HCT 95.6  21.3 - 08.6 %   MCV 88.3  78.0 - 100.0 fL   MCH 28.2  26.0 - 34.0 pg   MCHC 31.9  30.0 - 36.0 g/dL   RDW 57.8 (*) 46.9 - 62.9 %   Platelets 355  150 - 400 K/uL   Neutrophils Relative 64  43 - 77 %   Neutro Abs 6.0  1.7 - 7.7 K/uL   Lymphocytes Relative 25  12 - 46 %   Lymphs Abs 2.4  0.7 - 4.0 K/uL   Monocytes Relative 9  3 - 12 %   Monocytes Absolute 0.8  0.1 - 1.0 K/uL   Eosinophils Relative 2  0 - 5 %   Eosinophils Absolute 0.2  0.0 - 0.7 K/uL   Basophils Relative 1  0 - 1 %   Basophils Absolute 0.1  0.0 - 0.1 K/uL  COMPREHENSIVE METABOLIC PANEL      Result Value Range   Sodium 137  135 - 145 mEq/L   Potassium 3.9  3.5 - 5.1 mEq/L   Chloride 95 (*) 96 - 112 mEq/L   CO2 31  19 - 32 mEq/L   Glucose, Bld 111 (*) 70 - 99 mg/dL   BUN 19  6 - 23 mg/dL   Creatinine, Ser 5.28  0.50 - 1.10 mg/dL   Calcium 9.3  8.4 - 41.3 mg/dL   Total Protein 8.6 (*) 6.0 - 8.3 g/dL   Albumin 3.6  3.5 - 5.2 g/dL   AST 20  0 -  37 U/L   ALT 18  0 - 35 U/L   Alkaline Phosphatase 102  39 - 117 U/L   Total Bilirubin 0.2 (*) 0.3 - 1.2 mg/dL   GFR calc non Af Amer 89 (*) >90 mL/min   GFR calc Af Amer >90  >90 mL/min  LIPASE, BLOOD      Result Value Range   Lipase 125 (*) 11 - 59 U/L  Ct Abdomen Pelvis Wo Contrast  03/28/2012  *RADIOLOGY REPORT*  Clinical Data: Left lower back pain radiating to the left groin.  CT ABDOMEN AND PELVIS WITHOUT CONTRAST  Technique:  Multidetector CT imaging of the abdomen and pelvis was performed following the standard protocol without intravenous contrast.  Comparison: No priors.  Findings:  Lung Bases: AICD lead terminating in the right ventricular apex. Additional lead appears to extend into the coronary sinus and coronary veins overlying the lateral wall of the left ventricle.  Abdomen/Pelvis:  There are no abnormal calcifications within the collecting system of either kidney, along the course of either ureter, or within the lumen of the urinary bladder.  No hydroureteronephrosis to suggest urinary tract obstruction at this time.  Status post cholecystectomy.  Diffusely decreased attenuation throughout the hepatic parenchyma, compatible with hepatic steatosis.  No focal hepatic lesions are noted.  The unenhanced appearance of the pancreas, spleen and bilateral adrenal glands is unremarkable.  There is extensive atherosclerosis throughout the abdominal and pelvic vasculature, without definite aneurysm. Extensive colonic diverticulosis is noted, particularly throughout the distal descending colon and sigmoid colon, without surrounding inflammatory changes to suggest acute diverticulitis at this time. The appendix is not confidently identified, and may be surgically absent.  Regardless, there are no inflammatory changes adjacent to the cecum to suggest an acute appendicitis at this time.  No significant volume of ascites.  No pneumoperitoneum.  No pathologic distension of small bowel.  Ovaries are  atrophic but otherwise unremarkable in appearance.  Status post hysterectomy.  Urinary bladder is normal in appearance.  No definite pathologic lymphadenopathy identified within the abdomen or pelvis on today's noncontrast CT examination.  Musculoskeletal: There are no aggressive appearing lytic or blastic lesions noted in the visualized portions of the skeleton.  There is a compression fracture of a L1 with approximately 40% loss of anterior vertebral body height which appears to be a chronic injury. 1.5 cm well circumscribed smoothly marginated soft tissue attenuation lesion in the inferior aspect of the left breast (image 4 of series 2).  IMPRESSION: 1.  No acute findings in the abdomen or pelvis to account for the patient's symptoms.  Specifically, no abnormal urinary tract calculi or findings of urinary tract obstruction.  2.  Extensive colonic diverticulosis, without findings to suggest acute diverticulitis at this time. 3.  Status post cholecystectomy and hysterectomy. 4.  Hepatic steatosis. 5.  1.5 cm soft tissue attenuation lesion in the inferior aspect of the left breast is nonspecific.  Correlation with mammography is recommended.   Original Report Authenticated By: Trudie Reed, M.D.     No diagnosis found.  7:15 PM pain is improved however returning. Norco ordered.  MDM  Lipase consistent with acute pancreatitis. I discussed the CT scan result with radiologist who feels that radiologic findings may lag lipase. Patient may also have element of lumbar radiculopathy. Hospitalist consulted for admission a 23 hour observation Meds bowel rest analgesia dx acute pancreatitis        Doug Sou, MD 03/28/12 1943

## 2012-03-29 ENCOUNTER — Encounter (HOSPITAL_COMMUNITY): Payer: Self-pay

## 2012-03-29 DIAGNOSIS — I10 Essential (primary) hypertension: Secondary | ICD-10-CM | POA: Diagnosis not present

## 2012-03-29 DIAGNOSIS — G4733 Obstructive sleep apnea (adult) (pediatric): Secondary | ICD-10-CM

## 2012-03-29 DIAGNOSIS — R002 Palpitations: Secondary | ICD-10-CM

## 2012-03-29 DIAGNOSIS — K859 Acute pancreatitis without necrosis or infection, unspecified: Secondary | ICD-10-CM | POA: Diagnosis not present

## 2012-03-29 LAB — CBC
Hemoglobin: 10.8 g/dL — ABNORMAL LOW (ref 12.0–15.0)
MCH: 27.8 pg (ref 26.0–34.0)
MCV: 88.4 fL (ref 78.0–100.0)
Platelets: 303 10*3/uL (ref 150–400)
RBC: 3.88 MIL/uL (ref 3.87–5.11)

## 2012-03-29 LAB — LIPID PANEL
HDL: 65 mg/dL (ref >39)
LDL Cholesterol: 141 mg/dL — ABNORMAL HIGH (ref 0–99)
Total CHOL/HDL Ratio: 3.6 RATIO

## 2012-03-29 LAB — COMPREHENSIVE METABOLIC PANEL
AST: 50 U/L — ABNORMAL HIGH (ref 0–37)
Albumin: 3.3 g/dL — ABNORMAL LOW (ref 3.5–5.2)
Chloride: 94 mEq/L — ABNORMAL LOW (ref 96–112)
Creatinine, Ser: 0.7 mg/dL (ref 0.50–1.10)
Sodium: 136 mEq/L (ref 135–145)
Total Bilirubin: 0.3 mg/dL (ref 0.3–1.2)

## 2012-03-29 MED ORDER — LIDOCAINE 5 % EX PTCH
1.0000 | MEDICATED_PATCH | CUTANEOUS | Status: DC
  Administered 2012-03-29 – 2012-03-30 (×2): 1 via TRANSDERMAL
  Filled 2012-03-29 (×2): qty 1

## 2012-03-29 MED ORDER — PROMETHAZINE HCL 25 MG/ML IJ SOLN
12.5000 mg | Freq: Four times a day (QID) | INTRAMUSCULAR | Status: DC | PRN
  Administered 2012-03-29: 12.5 mg via INTRAVENOUS
  Filled 2012-03-29: qty 1

## 2012-03-29 MED ORDER — POTASSIUM CHLORIDE CRYS ER 20 MEQ PO TBCR
40.0000 meq | EXTENDED_RELEASE_TABLET | Freq: Once | ORAL | Status: AC
  Administered 2012-03-29: 40 meq via ORAL
  Filled 2012-03-29: qty 2

## 2012-03-29 MED ORDER — SODIUM CHLORIDE 0.9 % IV SOLN
INTRAVENOUS | Status: DC
  Administered 2012-03-29: 18:00:00 via INTRAVENOUS

## 2012-03-29 MED ORDER — MORPHINE SULFATE 2 MG/ML IJ SOLN
1.0000 mg | INTRAMUSCULAR | Status: DC | PRN
  Administered 2012-03-29 – 2012-03-30 (×2): 1 mg via INTRAVENOUS
  Filled 2012-03-29 (×2): qty 1

## 2012-03-29 MED ORDER — ONDANSETRON HCL 4 MG/2ML IJ SOLN
4.0000 mg | Freq: Four times a day (QID) | INTRAMUSCULAR | Status: DC | PRN
  Administered 2012-03-29 (×2): 4 mg via INTRAVENOUS
  Filled 2012-03-29 (×2): qty 2

## 2012-03-29 NOTE — Progress Notes (Signed)
Patient refuses CPAP. RT encouraged patient to call if she changed her mind.

## 2012-03-29 NOTE — Evaluation (Addendum)
Physical Therapy One Time Evaluation Patient Details Name: Stacy Grimes MRN: [id number] DOB: [birth year] Today's Date: 03/29/2012 Time: 4098-1191 PT Time Calculation (min): 19 min  PT Assessment / Plan / Recommendation Clinical Impression  Pt presents with pancreatitis with history of CHF, COPD (on home O2), HTN and AAA.  Also note L1 compression fracture in history. Tolerated OOB and ambulation in hallway at supervision level and on 2L O2 with SaO2 at 91% following amb.  Pt will not require any further therapy in acute venue or at home.  PT encouraged pt to ambulate frequently with nursing staff.  PT to sign off at this time.      PT Assessment  Patent does not need any further PT services    Follow Up Recommendations  No PT follow up    Does the patient have the potential to tolerate intense rehabilitation      Barriers to Discharge        Equipment Recommendations  None recommended by PT    Recommendations for Other Services     Frequency      Precautions / Restrictions Precautions Precautions: None   Pertinent Vitals/Pain Pt with pain in lower back and in abdomen (LLQ)      Mobility  Bed Mobility Bed Mobility: Supine to Sit Supine to Sit: 6: Modified independent (Device/Increase time) Transfers Transfers: Sit to Stand;Stand to Sit Sit to Stand: 6: Modified independent (Device/Increase time) Stand to Sit: 6: Modified independent (Device/Increase time) Details for Transfer Assistance: increased time Ambulation/Gait Ambulation/Gait Assistance: 4: Min guard;5: Supervision Ambulation Distance (Feet): 200 Feet Assistive device: None Ambulation/Gait Assistance Details: Pt initially wanted to assist with O2 tank, however increased difficulty and assisted pt with task.  Noted some minor imbalances, however no LOB noted during ambulation.   Gait Pattern: Step-through pattern;Decreased stride length Stairs: Yes Stairs Assistance: 4: Min guard;4: Min assist Stair  Management Technique: One rail Right;Step to pattern;Forwards (therapist on pts other side. ) Number of Stairs: 10    Exercises     PT Diagnosis:    PT Problem List:   PT Treatment Interventions:     PT Goals    Visit Information  Last PT Received On: 03/29/12 Assistance Needed: +1    Subjective Data  Subjective: I'm doing ok Patient Stated Goal: to return home.    Prior Functioning  Home Living Lives With: Spouse Available Help at Discharge: Family;Available PRN/intermittently Type of Home: House Home Access: Stairs to enter [employer] of Steps: 4 Entrance Stairs-Rails: Right;Left Home Layout: One level Bathroom Shower/Tub: Engineer, manufacturing systems: Standard Home Adaptive Equipment: Environmental consultant - rolling;Straight cane Prior Function Level of Independence: Needs assistance Needs Assistance: Bathing Able to Take Stairs?: Yes Driving: No Vocation: Retired Comments: husband usually assists pt getting in/out of shower.  Communication Communication: Surveyor, mining Overall Cognitive Status: Appears within functional limits for tasks assessed/performed Arousal/Alertness: Awake/alert Orientation Level: Appears intact for tasks assessed Behavior During Session: [facility] for tasks performed    Extremity/Trunk Assessment Right Lower Extremity Assessment RLE ROM/Strength/Tone: WFL for tasks assessed RLE Sensation: WFL - Light Touch Left Lower Extremity Assessment LLE ROM/Strength/Tone: WFL for tasks assessed LLE Sensation: WFL - Light Touch Trunk Assessment Trunk Assessment: Normal   Balance    End of Session PT - End of Session Equipment Utilized During Treatment: Oxygen Activity Tolerance: Patient limited by fatigue Patient left: in chair;with call bell/phone within reach;with family/visitor present Nurse Communication: Mobility status  GP Functional Assessment Tool  Used: Clinical judgement Functional Limitation: Mobility: Walking and  moving around Mobility: Walking and Moving Around Current Status [id number]): At least 1 percent but less than 20 percent impaired, limited or restricted Mobility: Walking and Moving Around Goal Status [phone]): At least 1 percent but less than 20 percent impaired, limited or restricted Mobility: Walking and Moving Around Discharge Status [phone]): At least 1 percent but less than 20 percent impaired, limited or restricted   Vista Deck 03/29/2012, 3:42 PM

## 2012-03-29 NOTE — Progress Notes (Signed)
[facility] PROGRESS NOTE  Stacy Grimes JWJ:[id number] DOB: 10-Jun-1950 DOA: 03/28/2012 PCP: Lonie Peak, PA  Assessment/Plan: Nausea- CT scan does not show any active abdominal issues, check lipase in AM, has had cholecytectomy, cipro/flagyl, check LFTs in AM  Pain- moved from back  To right side- ? If related to L1 compression fracture  1.5 cm soft tissue attenuation lesion in the inferior aspect of  the left breast is nonspecific. Correlation with mammography is  recommended.  Hypokalemia- replete  L1 compression fracture- add lidocaine patch  Obesity- encourage weight loss  Code Status: full Family Communication: patient at bedside Disposition Plan: home soon   Consultants:  none  Procedures:  none  Antibiotics:  cipro/flagyl  HPI/Subjective: Patient c/o nausea Unable to describe pain in back that has now moved around to the side except to say that it hurts and is worse with movement poor historian   Objective: Filed Vitals:   03/28/12 2103 03/28/12 2119 03/28/12 2200 03/29/12 0520  BP:  127/55 126/80 152/76  Pulse:  69 75 79  Temp:  97.5 F (36.4 C) 97.8 F (36.6 C) 98.3 F (36.8 C)  TempSrc:  Oral Oral Oral  Resp:  20 20 20   Height:   5\' 3"  (1.6 m)   Weight:   97.2 kg (214 lb 4.6 oz)   SpO2: 98% 97% 96% 97%    Intake/Output Summary (Last 24 hours) at 03/29/12 0807 Last data filed at 03/29/12 6213  Gross per 24 hour  Intake    663 ml  Output   1000 ml  Net   -337 ml   Filed Weights   03/28/12 2200  Weight: 97.2 kg (214 lb 4.6 oz)    Exam:   General:  Pleasant/cooperative  Cardiovascular: rrr  Respiratory: clear  Abdomen: +BS, soft, NT  Data Reviewed: Basic Metabolic Panel:  Recent Labs Lab 03/28/12 1528 03/29/12 0341  NA 137 136  K 3.9 3.3*  CL 95* 94*  CO2 31 30  GLUCOSE 111* 137*  BUN 19 17  CREATININE 0.76 0.70  CALCIUM 9.3 9.0   Liver Function Tests:  Recent Labs Lab 03/28/12 1528 03/29/12 0341   AST 20 50*  ALT 18 38*  ALKPHOS 102 99  BILITOT 0.2* 0.3  PROT 8.6* 7.5  ALBUMIN 3.6 3.3*    Recent Labs Lab 03/28/12 1528  LIPASE 125*   No results found for this basename: AMMONIA,  in the last 168 hours CBC:  Recent Labs Lab 03/28/12 1528 03/29/12 0341  WBC 9.5 10.9*  NEUTROABS 6.0  --   HGB 11.8* 10.8*  HCT 37.0 34.3*  MCV 88.3 88.4  PLT 355 303   Cardiac Enzymes: No results found for this basename: CKTOTAL, CKMB, CKMBINDEX, TROPONINI,  in the last 168 hours BNP (last 3 results) No results found for this basename: PROBNP,  in the last 8760 hours CBG: No results found for this basename: GLUCAP,  in the last 168 hours  No results found for this or any previous visit (from the past 240 hour(s)).   Studies: Ct Abdomen Pelvis Wo Contrast  03/28/2012  *RADIOLOGY REPORT*  Clinical Data: Left lower back pain radiating to the left groin.  CT ABDOMEN AND PELVIS WITHOUT CONTRAST  Technique:  Multidetector CT imaging of the abdomen and pelvis was performed following the standard protocol without intravenous contrast.  Comparison: No priors.  Findings:  Lung Bases: AICD lead terminating in the right ventricular apex. Additional lead appears to extend into the coronary sinus and  coronary veins overlying the lateral wall of the left ventricle.  Abdomen/Pelvis:  There are no abnormal calcifications within the collecting system of either kidney, along the course of either ureter, or within the lumen of the urinary bladder.  No hydroureteronephrosis to suggest urinary tract obstruction at this time.  Status post cholecystectomy.  Diffusely decreased attenuation throughout the hepatic parenchyma, compatible with hepatic steatosis.  No focal hepatic lesions are noted.  The unenhanced appearance of the pancreas, spleen and bilateral adrenal glands is unremarkable.  There is extensive atherosclerosis throughout the abdominal and pelvic vasculature, without definite aneurysm. Extensive colonic  diverticulosis is noted, particularly throughout the distal descending colon and sigmoid colon, without surrounding inflammatory changes to suggest acute diverticulitis at this time. The appendix is not confidently identified, and may be surgically absent.  Regardless, there are no inflammatory changes adjacent to the cecum to suggest an acute appendicitis at this time.  No significant volume of ascites.  No pneumoperitoneum.  No pathologic distension of small bowel.  Ovaries are atrophic but otherwise unremarkable in appearance.  Status post hysterectomy.  Urinary bladder is normal in appearance.  No definite pathologic lymphadenopathy identified within the abdomen or pelvis on today's noncontrast CT examination.  Musculoskeletal: There are no aggressive appearing lytic or blastic lesions noted in the visualized portions of the skeleton.  There is a compression fracture of a L1 with approximately 40% loss of anterior vertebral body height which appears to be a chronic injury. 1.5 cm well circumscribed smoothly marginated soft tissue attenuation lesion in the inferior aspect of the left breast (image 4 of series 2).  IMPRESSION: 1.  No acute findings in the abdomen or pelvis to account for the patient's symptoms.  Specifically, no abnormal urinary tract calculi or findings of urinary tract obstruction.  2.  Extensive colonic diverticulosis, without findings to suggest acute diverticulitis at this time. 3.  Status post cholecystectomy and hysterectomy. 4.  Hepatic steatosis. 5.  1.5 cm soft tissue attenuation lesion in the inferior aspect of the left breast is nonspecific.  Correlation with mammography is recommended.   Original Report Authenticated By: Trudie Reed, M.D.     Scheduled Meds: . allopurinol  450 mg Oral Daily  . aspirin EC  81 mg Oral Daily  . budesonide-formoterol  2 puff Inhalation Daily  . carvedilol  12.5 mg Oral BID WC  . ciprofloxacin  500 mg Oral BID  . digoxin  125 mcg Oral Daily   . docusate sodium  100 mg Oral BID  . furosemide  40 mg Oral BID  . gabapentin  600 mg Oral BID  . heparin  5,000 Units Subcutaneous Q8H  . levothyroxine  88 mcg Oral QAC breakfast  . losartan  25 mg Oral Daily  . metroNIDAZOLE  500 mg Oral Q8H  . pantoprazole  40 mg Oral Daily  . potassium chloride  40 mEq Oral Once  . roflumilast  500 mcg Oral Daily  . sodium chloride  3 mL Intravenous Q12H  . sodium chloride  3 mL Intravenous Q12H  . tiotropium  18 mcg Inhalation Daily  . venlafaxine XR  75 mg Oral Daily   Continuous Infusions:   Active Problems:   * No active hospital problems. *    Time spent: 35    [facility], Jakhi Dishman  [facility] Pager [phone]. If 8PM-8AM, please contact night-coverage at [email or web address], password [facility] 03/29/2012, 8:07 AM  LOS: 1 day

## 2012-03-30 ENCOUNTER — Observation Stay (HOSPITAL_COMMUNITY): Payer: Medicare Other

## 2012-03-30 DIAGNOSIS — R002 Palpitations: Secondary | ICD-10-CM | POA: Diagnosis not present

## 2012-03-30 DIAGNOSIS — K7689 Other specified diseases of liver: Secondary | ICD-10-CM | POA: Diagnosis not present

## 2012-03-30 DIAGNOSIS — K859 Acute pancreatitis without necrosis or infection, unspecified: Secondary | ICD-10-CM | POA: Diagnosis not present

## 2012-03-30 DIAGNOSIS — G4733 Obstructive sleep apnea (adult) (pediatric): Secondary | ICD-10-CM | POA: Diagnosis not present

## 2012-03-30 DIAGNOSIS — I10 Essential (primary) hypertension: Secondary | ICD-10-CM | POA: Diagnosis not present

## 2012-03-30 LAB — COMPREHENSIVE METABOLIC PANEL
AST: 41 U/L — ABNORMAL HIGH (ref 0–37)
BUN: 12 mg/dL (ref 6–23)
CO2: 31 mEq/L (ref 19–32)
Calcium: 8.9 mg/dL (ref 8.4–10.5)
Chloride: 101 mEq/L (ref 96–112)
Creatinine, Ser: 0.89 mg/dL (ref 0.50–1.10)
GFR calc non Af Amer: 68 mL/min — ABNORMAL LOW (ref >90)
Total Bilirubin: 0.2 mg/dL — ABNORMAL LOW (ref 0.3–1.2)

## 2012-03-30 LAB — CBC
HCT: 36.4 % (ref 36.0–46.0)
MCH: 28 pg (ref 26.0–34.0)
MCV: 90.1 fL (ref 78.0–100.0)
Platelets: 286 10*3/uL (ref 150–400)
RDW: 16.3 % — ABNORMAL HIGH (ref 11.5–15.5)

## 2012-03-30 MED ORDER — CIPROFLOXACIN HCL 500 MG PO TABS: 500.0000 mg | ORAL_TABLET | Freq: Two times a day (BID) | ORAL | Status: DC

## 2012-03-30 MED ORDER — LIDOCAINE 5 % EX PTCH: 1.0000 | MEDICATED_PATCH | CUTANEOUS | Status: DC

## 2012-03-30 MED ORDER — METRONIDAZOLE 500 MG PO TABS: 500.0000 mg | ORAL_TABLET | Freq: Three times a day (TID) | ORAL | Status: DC

## 2012-03-30 NOTE — Progress Notes (Signed)
Discharge instructions given to pt, verbalized understanding. Left the unit in stable condition. 

## 2012-03-30 NOTE — Discharge Summary (Signed)
Physician Discharge Summary  Stacy Grimes JXB:[id number] DOB: 11/13/1950 DOA: 03/28/2012  PCP: Lonie Peak, PA  Admit date: 03/28/2012 Discharge date: 03/30/2012  Time spent: 35 minutes  Recommendations for Outpatient Follow-up:   Discharge Diagnoses:  Active Problems:   * No active hospital problems. *   Discharge Condition: improve  Diet recommendation: diabetic/cardiac  Filed Weights   03/28/12 2200 03/30/12 0516  Weight: 97.2 kg (214 lb 4.6 oz) 96.1 kg (211 lb 13.8 oz)    History of present illness:  History of Present Illness:This is a 62 y.o. year old female with multiple medical problems including hypertension, chronic systolic heart failure status post biventricular defibrillator, COPD on 3 1/2 L at home, hypertension, history of AAA, and diverticulosis presenting with left flank/left lower quadrant pain x2 days. patient gets her care in [place], [place]. Patient reports having sudden onset of severe left flank/left lower quadrant pain since yesterday. Patient is unsure source of pain. Patient states she noticed sudden onset sharp left flank pain that radiated to the left lower quadrant while at home. Pain seems to be worse with movement. Patient denies any recent strenuous activity or overexertion. Pain is 10 out of 10 at its worse. Pain seems to be relieved with rest per patient. No true dysuria though patient does report one episode of mild burning with urination today per patient. no hematuria the Patient does report some mild nausea with onset of symptoms though patient states that she ate last night (canned meat) with no worsening of flank/ abdominal pain associated with food. Patient with chronic constipation. There has been no change in bowel habits. Patient denies any chest pains shortness of breath. Has been stable on home oxygen regimen. Patient reports prior history of diverticulitis flares in the past. Patient states that symptoms are not necessarily similar to previous  flares in the past though she cannot completely rule this out. No chest pains or shortness of breath. No diaphoresis. No recent illnesses per patient.  In the ED general blood work and urinalysis was obtained which was essentially within normal limits. Patient was noted to have an elevated lipase at 125. A CT scan of the abdomen and pelvis was obtained that was negative for any kidney stones, urinary tract obstruction. There was significant diverticulosis however no imaging consistent with diverticulitis.   Hospital Course:  Nausea- CT scan does not show any active abdominal issues, lipase resolved, has had cholecytectomy, cipro/flagyl, LFts ok  Pain- moved from back To right side- ? If related to L1 compression fracture   1.5 cm soft tissue attenuation lesion in the inferior aspect of  the left breast is nonspecific. Correlation with mammography is  recommended.   Hypokalemia- repleted  L1 compression fracture- add lidocaine patch- pain improved  Obesity- encourage weight loss   Procedures:  none  Consultations:  none  Discharge Exam: Filed Vitals:   03/29/12 1315 03/29/12 2131 03/30/12 0516 03/30/12 0906  BP: 149/78 126/73 136/80   Pulse: 55 74 74   Temp: 97.6 F (36.4 C) 98.5 F (36.9 C) 97.9 F (36.6 C)   TempSrc: Oral Oral Oral   Resp: 20 20 20    Height:      Weight:   96.1 kg (211 lb 13.8 oz)   SpO2: 94% 96% 94% 94%    General: A+Ox3, NAD Cardiovascular: rrr Respiratory: clear anterior  Discharge Instructions   Future Appointments Provider Department Dept Phone   05/04/2012 8:10 AM [facility] [employer] [facility] [place]) [phone]  Medication List    ASK your doctor about these medications       allopurinol 300 MG tablet  Commonly known as:  ZYLOPRIM  Take 450 mg by mouth daily.     ALPRAZolam 0.25 MG tablet  Commonly known as:  XANAX  Take 0.25 mg by mouth at bedtime as needed for anxiety.     AMBIEN 10 MG  tablet  Generic drug:  zolpidem  Take 10 mg by mouth at bedtime as needed for sleep.     aspirin 81 MG EC tablet  Take 81 mg by mouth daily.     budesonide-formoterol 160-4.5 MCG/ACT inhaler  Commonly known as:  SYMBICORT  Inhale 2 puffs into the lungs daily.     carvedilol 25 MG tablet  Commonly known as:  COREG  Take 25 mg by mouth as directed. Take 1/2 tablet in the morning and 1/2 tablet in the evening     cyclobenzaprine 10 MG tablet  Commonly known as:  FLEXERIL  Take 10 mg by mouth 3 (three) times daily as needed for muscle spasms.     digoxin 0.125 MG tablet  Commonly known as:  LANOXIN  Take 125 mcg by mouth daily.     DUONEB 0.5-2.5 (3) MG/3ML Soln  Generic drug:  ipratropium-albuterol  Take 3 mLs by nebulization every 4 (four) hours as needed (for wheezing).     furosemide 40 MG tablet  Commonly known as:  LASIX  Take 40 mg by mouth 2 (two) times daily.     gabapentin 600 MG tablet  Commonly known as:  NEURONTIN  Take 600 mg by mouth 2 (two) times daily.     HYDROcodone-acetaminophen 5-500 MG per tablet  Commonly known as:  VICODIN  Take 1 tablet by mouth every 6 (six) hours as needed.     levothyroxine 88 MCG tablet  Commonly known as:  SYNTHROID, LEVOTHROID  Take 88 mcg by mouth daily.     losartan 50 MG tablet  Commonly known as:  COZAAR  Take 25 mg by mouth daily.     omeprazole 20 MG capsule  Commonly known as:  PRILOSEC  Take 20 mg by mouth daily.     PROVENTIL HFA 108 (90 BASE) MCG/ACT inhaler  Generic drug:  albuterol  Inhale 2 puffs into the lungs every 6 (six) hours as needed.     roflumilast 500 MCG Tabs tablet  Commonly known as:  DALIRESP  Take 500 mcg by mouth daily.     tiotropium 18 MCG inhalation capsule  Commonly known as:  SPIRIVA  Place 18 mcg into inhaler and inhale daily.     venlafaxine XR 37.5 MG 24 hr capsule  Commonly known as:  EFFEXOR-XR  Take 75 mg by mouth daily.          The results of significant  diagnostics from this hospitalization (including imaging, microbiology, ancillary and laboratory) are listed below for reference.    Significant Diagnostic Studies: Ct Abdomen Pelvis Wo Contrast  03/28/2012  *RADIOLOGY REPORT*  Clinical Data: Left lower back pain radiating to the left groin.  CT ABDOMEN AND PELVIS WITHOUT CONTRAST  Technique:  Multidetector CT imaging of the abdomen and pelvis was performed following the standard protocol without intravenous contrast.  Comparison: No priors.  Findings:  Lung Bases: AICD lead terminating in the right ventricular apex. Additional lead appears to extend into the coronary sinus and coronary veins overlying the lateral wall of the left ventricle.  Abdomen/Pelvis:  There are  no abnormal calcifications within the collecting system of either kidney, along the course of either ureter, or within the lumen of the urinary bladder.  No hydroureteronephrosis to suggest urinary tract obstruction at this time.  Status post cholecystectomy.  Diffusely decreased attenuation throughout the hepatic parenchyma, compatible with hepatic steatosis.  No focal hepatic lesions are noted.  The unenhanced appearance of the pancreas, spleen and bilateral adrenal glands is unremarkable.  There is extensive atherosclerosis throughout the abdominal and pelvic vasculature, without definite aneurysm. Extensive colonic diverticulosis is noted, particularly throughout the distal descending colon and sigmoid colon, without surrounding inflammatory changes to suggest acute diverticulitis at this time. The appendix is not confidently identified, and may be surgically absent.  Regardless, there are no inflammatory changes adjacent to the cecum to suggest an acute appendicitis at this time.  No significant volume of ascites.  No pneumoperitoneum.  No pathologic distension of small bowel.  Ovaries are atrophic but otherwise unremarkable in appearance.  Status post hysterectomy.  Urinary bladder is normal  in appearance.  No definite pathologic lymphadenopathy identified within the abdomen or pelvis on today's noncontrast CT examination.  Musculoskeletal: There are no aggressive appearing lytic or blastic lesions noted in the visualized portions of the skeleton.  There is a compression fracture of a L1 with approximately 40% loss of anterior vertebral body height which appears to be a chronic injury. 1.5 cm well circumscribed smoothly marginated soft tissue attenuation lesion in the inferior aspect of the left breast (image 4 of series 2).  IMPRESSION: 1.  No acute findings in the abdomen or pelvis to account for the patient's symptoms.  Specifically, no abnormal urinary tract calculi or findings of urinary tract obstruction.  2.  Extensive colonic diverticulosis, without findings to suggest acute diverticulitis at this time. 3.  Status post cholecystectomy and hysterectomy. 4.  Hepatic steatosis. 5.  1.5 cm soft tissue attenuation lesion in the inferior aspect of the left breast is nonspecific.  Correlation with mammography is recommended.   Original Report Authenticated By: Trudie Reed, M.D.    US Abdomen Complete  03/30/2012  *RADIOLOGY REPORT*  Clinical Data:  Pancreatitis with nausea and vomiting.  COMPLETE ABDOMINAL ULTRASOUND  Comparison:  CT abdomen pelvis 03/28/2012.  Findings:  Gallbladder:  Surgically absent.  Common bile duct:  Measures 6 mm, within normal limits.  Liver:  Diffusely increased echogenicity.  There is a hypoechoic or nearly anechoic lesion measuring 1.1 x 1.0 x 1.1 cm. No associated vascularity.  IVC:  Appears normal.  Pancreas:  No focal abnormality seen.  Spleen:  Measures 7.9 cm, negative.  Right Kidney:  Measures 11.7 cm.  Parenchymal echogenicity is normal.  No hydronephrosis.  No focal lesions.  Left Kidney:  Measures 11.7 cm.  Parenchymal echogenicity is normal.  No hydronephrosis.  No focal lesions.  Abdominal aorta:  Mid and distal portions are obscured by bowel gas.   Otherwise, no aneurysm.  IMPRESSION: Hepatic steatosis.  Hypoechoic or nearly anechoic lesion in the liver is difficult to fully characterize but may represent a minimally complex cyst.   Original Report Authenticated By: Leanna Battles, M.D.     Microbiology: No results found for this or any previous visit (from the past 240 hour(s)).   Labs: Basic Metabolic Panel:  Recent Labs Lab 03/28/12 1528 03/29/12 0341 03/30/12 0440  NA 137 136 142  K 3.9 3.3* 3.5  CL 95* 94* 101  CO2 31 30 31   GLUCOSE 111* 137* 112*  BUN 19 17 12  CREATININE 0.76 0.70 0.89  CALCIUM 9.3 9.0 8.9   Liver Function Tests:  Recent Labs Lab 03/28/12 1528 03/29/12 0341 03/30/12 0440  AST 20 50* 41*  ALT 18 38* 46*  ALKPHOS 102 99 101  BILITOT 0.2* 0.3 0.2*  PROT 8.6* 7.5 7.5  ALBUMIN 3.6 3.3* 3.3*    Recent Labs Lab 03/28/12 1528 03/30/12 0440  LIPASE 125* 33   No results found for this basename: AMMONIA,  in the last 168 hours CBC:  Recent Labs Lab 03/28/12 1528 03/29/12 0341 03/30/12 0440  WBC 9.5 10.9* 7.3  NEUTROABS 6.0  --   --   HGB 11.8* 10.8* 11.3*  HCT 37.0 34.3* 36.4  MCV 88.3 88.4 90.1  PLT 355 303 286   Cardiac Enzymes: No results found for this basename: CKTOTAL, CKMB, CKMBINDEX, TROPONINI,  in the last 168 hours BNP: BNP (last 3 results) No results found for this basename: PROBNP,  in the last 8760 hours CBG: No results found for this basename: GLUCAP,  in the last 168 hours     Signed:  Marlin Canary  [facility] 03/30/2012, 12:35 PM

## 2012-03-31 LAB — URINE CULTURE
Culture: NO GROWTH
Special Requests: NORMAL

## 2012-04-21 DIAGNOSIS — R748 Abnormal levels of other serum enzymes: Secondary | ICD-10-CM | POA: Diagnosis not present

## 2012-04-21 DIAGNOSIS — M545 Low back pain: Secondary | ICD-10-CM | POA: Diagnosis not present

## 2012-04-21 DIAGNOSIS — K5900 Constipation, unspecified: Secondary | ICD-10-CM | POA: Diagnosis not present

## 2012-04-28 ENCOUNTER — Telehealth: Payer: Self-pay | Admitting: Gastroenterology

## 2012-04-28 ENCOUNTER — Encounter: Payer: Self-pay | Admitting: Gastroenterology

## 2012-04-28 NOTE — Telephone Encounter (Signed)
Pt had a colon in 2009 with some polyps removed. Path report states adenomatous polyp with no high grade dysplasia or malignancy. Should pt had had a 5 year recall? Please advise.

## 2012-04-28 NOTE — Telephone Encounter (Signed)
yes

## 2012-04-28 NOTE — Telephone Encounter (Signed)
Letter mailed to pt confirming that recall date is 5 years which would be 2014.

## 2012-05-04 ENCOUNTER — Encounter: Payer: Self-pay | Admitting: Internal Medicine

## 2012-05-04 ENCOUNTER — Ambulatory Visit (INDEPENDENT_AMBULATORY_CARE_PROVIDER_SITE_OTHER): Payer: Medicare Other | Admitting: *Deleted

## 2012-05-04 ENCOUNTER — Other Ambulatory Visit: Payer: Self-pay

## 2012-05-04 DIAGNOSIS — I428 Other cardiomyopathies: Secondary | ICD-10-CM | POA: Diagnosis not present

## 2012-05-04 DIAGNOSIS — I5022 Chronic systolic (congestive) heart failure: Secondary | ICD-10-CM | POA: Diagnosis not present

## 2012-05-04 DIAGNOSIS — Z95810 Presence of automatic (implantable) cardiac defibrillator: Secondary | ICD-10-CM

## 2012-05-05 DIAGNOSIS — R748 Abnormal levels of other serum enzymes: Secondary | ICD-10-CM | POA: Diagnosis not present

## 2012-05-05 DIAGNOSIS — M109 Gout, unspecified: Secondary | ICD-10-CM | POA: Diagnosis not present

## 2012-05-05 DIAGNOSIS — J449 Chronic obstructive pulmonary disease, unspecified: Secondary | ICD-10-CM | POA: Diagnosis not present

## 2012-05-05 DIAGNOSIS — M545 Low back pain, unspecified: Secondary | ICD-10-CM | POA: Diagnosis not present

## 2012-05-05 DIAGNOSIS — Z79899 Other long term (current) drug therapy: Secondary | ICD-10-CM | POA: Diagnosis not present

## 2012-05-05 DIAGNOSIS — I10 Essential (primary) hypertension: Secondary | ICD-10-CM | POA: Diagnosis not present

## 2012-05-06 LAB — REMOTE ICD DEVICE
AL IMPEDENCE ICD: 390 Ohm
ATRIAL PACING ICD: 1.9 pct
BAMS-0001: 150 {beats}/min
BRDY-0004RA: 120 {beats}/min
DEVICE MODEL ICD: 605521
HV IMPEDENCE: 56 Ohm
LV LEAD IMPEDENCE ICD: 640 Ohm
RV LEAD IMPEDENCE ICD: 680 Ohm
TZAT-0001SLOWVT: 1
TZAT-0004SLOWVT: 8
TZAT-0018SLOWVT: NEGATIVE
TZON-0003SLOWVT: 300 ms
TZON-0010SLOWVT: 80 ms
TZST-0001SLOWVT: 2
TZST-0001SLOWVT: 5
TZST-0003SLOWVT: 36 J
TZST-0003SLOWVT: 40 J
VENTRICULAR PACING ICD: 98 pct

## 2012-05-17 ENCOUNTER — Other Ambulatory Visit: Payer: Self-pay | Admitting: Internal Medicine

## 2012-06-04 ENCOUNTER — Encounter: Payer: Self-pay | Admitting: *Deleted

## 2012-06-08 ENCOUNTER — Encounter: Payer: Self-pay | Admitting: *Deleted

## 2012-06-08 NOTE — Progress Notes (Signed)
Patient ID: Stacy Grimes, female   DOB: 04/29/1950, 62 y.o.   MRN: [id number] Pt came in for a PV 06-08-12 for a colonoscopy on 06-22-12.  She is on home oxygen, has an ICD, among other health problems.  She is also complaining of dysphagia and constipation.  OV made for 06-30-12 at 10:45 and colon for 06-22-12 cancelled

## 2012-06-14 ENCOUNTER — Other Ambulatory Visit: Payer: Self-pay | Admitting: Internal Medicine

## 2012-06-22 ENCOUNTER — Encounter: Payer: Medicare Other | Admitting: Gastroenterology

## 2012-06-30 ENCOUNTER — Ambulatory Visit: Payer: Medicare Other | Admitting: Gastroenterology

## 2012-06-30 DIAGNOSIS — I10 Essential (primary) hypertension: Secondary | ICD-10-CM | POA: Diagnosis not present

## 2012-06-30 DIAGNOSIS — I5042 Chronic combined systolic (congestive) and diastolic (congestive) heart failure: Secondary | ICD-10-CM | POA: Diagnosis not present

## 2012-06-30 DIAGNOSIS — J309 Allergic rhinitis, unspecified: Secondary | ICD-10-CM | POA: Diagnosis not present

## 2012-06-30 DIAGNOSIS — I509 Heart failure, unspecified: Secondary | ICD-10-CM | POA: Diagnosis not present

## 2012-06-30 DIAGNOSIS — I428 Other cardiomyopathies: Secondary | ICD-10-CM | POA: Diagnosis not present

## 2012-06-30 DIAGNOSIS — J449 Chronic obstructive pulmonary disease, unspecified: Secondary | ICD-10-CM | POA: Diagnosis not present

## 2012-07-02 ENCOUNTER — Encounter: Payer: Self-pay | Admitting: Gastroenterology

## 2012-07-02 ENCOUNTER — Encounter (HOSPITAL_COMMUNITY): Payer: Self-pay | Admitting: *Deleted

## 2012-07-02 ENCOUNTER — Ambulatory Visit (INDEPENDENT_AMBULATORY_CARE_PROVIDER_SITE_OTHER): Payer: Medicare Other | Admitting: Gastroenterology

## 2012-07-02 VITALS — BP 100/56 | HR 80 | Ht 63.0 in | Wt 216.6 lb

## 2012-07-02 DIAGNOSIS — K5900 Constipation, unspecified: Secondary | ICD-10-CM | POA: Diagnosis not present

## 2012-07-02 DIAGNOSIS — Z860100 Personal history of colon polyps, unspecified: Secondary | ICD-10-CM | POA: Insufficient documentation

## 2012-07-02 DIAGNOSIS — R1310 Dysphagia, unspecified: Secondary | ICD-10-CM

## 2012-07-02 NOTE — Assessment & Plan Note (Signed)
Dysphagia could be do to an early esophageal stricture.  Recommendations #1 upper endoscopy with dilatation as indicated  Risks, alternatives, and complications of the procedure, including bleeding, perforation, and possible need for surgery, were explained to the patient.  Patient's questions were answered.

## 2012-07-02 NOTE — Progress Notes (Signed)
History of Present Illness: Pleasant 62 year old white female with oxygen-dependent COPD, dilated cardiomyopathy, here to schedule followup colonoscopy. Adenomatous colon polyps were removed in 2004 and 2009.  She is also complaining of dysphagia to solids and occasionally liquids. She denies pyrosis. She's on narcotics chronically for back pain and has moderately severe constipation.    Past Medical History  Diagnosis Date  . Non-ischemic cardiomyopathy   . CHF (congestive heart failure)   . Dilated cardiomyopathy   . Chronic systolic heart failure   . Hypertension   . COPD (chronic obstructive pulmonary disease)   . Deafness   . AAA (abdominal aortic aneurysm)    Past Surgical History  Procedure Laterality Date  . Cholecystectomy    . Bladder surgery    . Knee surgery      right  . Wrist surgery      right  . Middle ear surgery    . Cardiac catheterization    . Cardiac defibrillator placement      pacemaker  . Abdominal hysterectomy     family history includes Breast cancer in her mother; Heart attack in her sisters; Heart disease in her father; Hypertension in her brother and mother; and Stroke in her mother. Current Outpatient Prescriptions  Medication Sig Dispense Refill  . albuterol (PROVENTIL HFA) 108 (90 BASE) MCG/ACT inhaler Inhale 2 puffs into the lungs every 6 (six) hours as needed.        Marland Kitchen allopurinol (ZYLOPRIM) 300 MG tablet Take 450 mg by mouth daily.      Marland Kitchen ALPRAZolam (XANAX) 0.25 MG tablet Take 0.25 mg by mouth at bedtime as needed for anxiety.       Marland Kitchen aspirin 81 MG EC tablet Take 81 mg by mouth daily.        . budesonide-formoterol (SYMBICORT) 160-4.5 MCG/ACT inhaler Inhale 2 puffs into the lungs daily.        . carvedilol (COREG) 25 MG tablet Take 25 mg by mouth as directed. Take 1/2 tablet in the morning and 1/2 tablet in the evening      . cyclobenzaprine (FLEXERIL) 10 MG tablet Take 10 mg by mouth 3 (three) times daily as needed for muscle spasms.       .  digoxin (LANOXIN) 0.125 MG tablet Take 125 mcg by mouth daily.        . furosemide (LASIX) 40 MG tablet Take 40 mg by mouth 2 (two) times daily.       Marland Kitchen gabapentin (NEURONTIN) 600 MG tablet Take 600 mg by mouth 2 (two) times daily.      Marland Kitchen HYDROcodone-acetaminophen (VICODIN) 5-500 MG per tablet Take 1 tablet by mouth every 6 (six) hours as needed.      Marland Kitchen ipratropium-albuterol (DUONEB) 0.5-2.5 (3) MG/3ML SOLN Take 3 mLs by nebulization every 4 (four) hours as needed (for wheezing).       Marland Kitchen levothyroxine (SYNTHROID, LEVOTHROID) 88 MCG tablet Take 88 mcg by mouth daily.      Marland Kitchen lidocaine (LIDODERM) 5 % Place 1 patch onto the skin daily. Remove & Discard patch within 12 hours or as directed by MD  30 patch  0  . losartan (COZAAR) 50 MG tablet Take 25 mg by mouth daily.      Marland Kitchen omeprazole (PRILOSEC) 20 MG capsule Take 20 mg by mouth daily.      . roflumilast (DALIRESP) 500 MCG TABS tablet Take 500 mcg by mouth daily.      Marland Kitchen tiotropium (SPIRIVA) 18 MCG inhalation capsule  Place 18 mcg into inhaler and inhale daily.        Marland Kitchen venlafaxine (EFFEXOR-XR) 37.5 MG 24 hr capsule Take 75 mg by mouth daily.       Marland Kitchen zolpidem (AMBIEN) 10 MG tablet Take 10 mg by mouth at bedtime as needed for sleep.        No current facility-administered medications for this visit.   Allergies as of 07/02/2012  . (No Known Allergies)    reports that she quit smoking about 10 years ago. Her smoking use included Cigarettes. She smoked 0.00 packs per day. She has never used smokeless tobacco. She reports that she does not drink alcohol or use illicit drugs.     Review of Systems: She complains of chronic back pain. She has baseline dyspnea on exertion. Pertinent positive and negative review of systems were noted in the above HPI section. All other review of systems were otherwise negative.  Vital signs were reviewed in today's medical record Physical Exam: General: Moderately obese female in no acute distress Skin:  anicteric Head: Normocephalic and atraumatic Eyes:  sclerae anicteric, EOMI Ears: Normal auditory acuity Mouth: No deformity or lesions Neck: Supple, no masses or thyromegaly Lungs: Clear throughout to auscultation Heart: Regular rate and rhythm; no murmurs, rubs or bruits Abdomen: Soft, non tender and non distended. No masses, hepatosplenomegaly or hernias noted. Normal Bowel sounds Rectal:deferred Musculoskeletal: Symmetrical with no gross deformities  Skin: No lesions on visible extremities Pulses:  Normal pulses noted Extremities: No clubbing, cyanosis, edema or deformities noted Neurological: Alert oriented x 4, grossly nonfocal Cervical Nodes:  No significant cervical adenopathy Inguinal Nodes: No significant inguinal adenopathy Psychological:  Alert and cooperative. Normal mood and affect

## 2012-07-02 NOTE — Assessment & Plan Note (Signed)
This is very likely due to medicines including narcotics.  Recommendations #1 fiber supplementation and MiraLax as needed

## 2012-07-02 NOTE — Assessment & Plan Note (Signed)
Plan followup colonoscopy 

## 2012-07-02 NOTE — Patient Instructions (Addendum)
You have a colonoscopy scheduled You have an Endoscopy scheduled

## 2012-07-05 NOTE — Interval H&P Note (Signed)
History and Physical Interval Note:  07/05/2012 1:45 PM  Stacy Grimes  has presented today for surgery, with the diagnosis of dysphgia  The various methods of treatment have been discussed with the patient and family. After consideration of risks, benefits and other options for treatment, the patient has consented to  Procedure(s): ESOPHAGOGASTRODUODENOSCOPY (EGD) (N/A) BALLOON DILATION (N/A) as a surgical intervention .  The patient's history has been reviewed, patient examined, no change in status, stable for surgery.  I have reviewed the patient's chart and labs.  Questions were answered to the patient's satisfaction.     Lina Sar

## 2012-07-05 NOTE — H&P (View-Only) (Signed)
History of Present Illness: Pleasant 62-year-old white female with oxygen-dependent COPD, dilated cardiomyopathy, here to schedule followup colonoscopy. Adenomatous colon polyps were removed in 2004 and 2009.  She is also complaining of dysphagia to solids and occasionally liquids. She denies pyrosis. She's on narcotics chronically for back pain and has moderately severe constipation.    Past Medical History  Diagnosis Date  . Non-ischemic cardiomyopathy   . CHF (congestive heart failure)   . Dilated cardiomyopathy   . Chronic systolic heart failure   . Hypertension   . COPD (chronic obstructive pulmonary disease)   . Deafness   . AAA (abdominal aortic aneurysm)    Past Surgical History  Procedure Laterality Date  . Cholecystectomy    . Bladder surgery    . Knee surgery      right  . Wrist surgery      right  . Middle ear surgery    . Cardiac catheterization    . Cardiac defibrillator placement      pacemaker  . Abdominal hysterectomy     family history includes Breast cancer in her mother; Heart attack in her sisters; Heart disease in her father; Hypertension in her brother and mother; and Stroke in her mother. Current Outpatient Prescriptions  Medication Sig Dispense Refill  . albuterol (PROVENTIL HFA) 108 (90 BASE) MCG/ACT inhaler Inhale 2 puffs into the lungs every 6 (six) hours as needed.        . allopurinol (ZYLOPRIM) 300 MG tablet Take 450 mg by mouth daily.      . ALPRAZolam (XANAX) 0.25 MG tablet Take 0.25 mg by mouth at bedtime as needed for anxiety.       . aspirin 81 MG EC tablet Take 81 mg by mouth daily.        . budesonide-formoterol (SYMBICORT) 160-4.5 MCG/ACT inhaler Inhale 2 puffs into the lungs daily.        . carvedilol (COREG) 25 MG tablet Take 25 mg by mouth as directed. Take 1/2 tablet in the morning and 1/2 tablet in the evening      . cyclobenzaprine (FLEXERIL) 10 MG tablet Take 10 mg by mouth 3 (three) times daily as needed for muscle spasms.       .  digoxin (LANOXIN) 0.125 MG tablet Take 125 mcg by mouth daily.        . furosemide (LASIX) 40 MG tablet Take 40 mg by mouth 2 (two) times daily.       . gabapentin (NEURONTIN) 600 MG tablet Take 600 mg by mouth 2 (two) times daily.      . HYDROcodone-acetaminophen (VICODIN) 5-500 MG per tablet Take 1 tablet by mouth every 6 (six) hours as needed.      . ipratropium-albuterol (DUONEB) 0.5-2.5 (3) MG/3ML SOLN Take 3 mLs by nebulization every 4 (four) hours as needed (for wheezing).       . levothyroxine (SYNTHROID, LEVOTHROID) 88 MCG tablet Take 88 mcg by mouth daily.      . lidocaine (LIDODERM) 5 % Place 1 patch onto the skin daily. Remove & Discard patch within 12 hours or as directed by MD  30 patch  0  . losartan (COZAAR) 50 MG tablet Take 25 mg by mouth daily.      . omeprazole (PRILOSEC) 20 MG capsule Take 20 mg by mouth daily.      . roflumilast (DALIRESP) 500 MCG TABS tablet Take 500 mcg by mouth daily.      . tiotropium (SPIRIVA) 18 MCG inhalation capsule   Place 18 mcg into inhaler and inhale daily.        . venlafaxine (EFFEXOR-XR) 37.5 MG 24 hr capsule Take 75 mg by mouth daily.       . zolpidem (AMBIEN) 10 MG tablet Take 10 mg by mouth at bedtime as needed for sleep.        No current facility-administered medications for this visit.   Allergies as of 07/02/2012  . (No Known Allergies)    reports that she quit smoking about 10 years ago. Her smoking use included Cigarettes. She smoked 0.00 packs per day. She has never used smokeless tobacco. She reports that she does not drink alcohol or use illicit drugs.     Review of Systems: She complains of chronic back pain. She has baseline dyspnea on exertion. Pertinent positive and negative review of systems were noted in the above HPI section. All other review of systems were otherwise negative.  Vital signs were reviewed in today's medical record Physical Exam: General: Moderately obese female in no acute distress Skin:  anicteric Head: Normocephalic and atraumatic Eyes:  sclerae anicteric, EOMI Ears: Normal auditory acuity Mouth: No deformity or lesions Neck: Supple, no masses or thyromegaly Lungs: Clear throughout to auscultation Heart: Regular rate and rhythm; no murmurs, rubs or bruits Abdomen: Soft, non tender and non distended. No masses, hepatosplenomegaly or hernias noted. Normal Bowel sounds Rectal:deferred Musculoskeletal: Symmetrical with no gross deformities  Skin: No lesions on visible extremities Pulses:  Normal pulses noted Extremities: No clubbing, cyanosis, edema or deformities noted Neurological: Alert oriented x 4, grossly nonfocal Cervical Nodes:  No significant cervical adenopathy Inguinal Nodes: No significant inguinal adenopathy Psychological:  Alert and cooperative. Normal mood and affect       

## 2012-07-06 ENCOUNTER — Ambulatory Visit (HOSPITAL_COMMUNITY)
Admission: RE | Admit: 2012-07-06 | Discharge: 2012-07-06 | Disposition: A | Discharge status: Home / Self Care | Payer: Medicare Other | Source: Ambulatory Visit | Attending: Gastroenterology | Admitting: Gastroenterology

## 2012-07-06 ENCOUNTER — Other Ambulatory Visit: Payer: Self-pay

## 2012-07-06 ENCOUNTER — Encounter (HOSPITAL_COMMUNITY)
Admission: RE | Disposition: A | Discharge status: Home / Self Care | Payer: Self-pay | Source: Ambulatory Visit | Attending: Gastroenterology

## 2012-07-06 DIAGNOSIS — Z9981 Dependence on supplemental oxygen: Secondary | ICD-10-CM | POA: Insufficient documentation

## 2012-07-06 DIAGNOSIS — Z79899 Other long term (current) drug therapy: Secondary | ICD-10-CM | POA: Diagnosis not present

## 2012-07-06 DIAGNOSIS — K2960 Other gastritis without bleeding: Secondary | ICD-10-CM | POA: Diagnosis not present

## 2012-07-06 DIAGNOSIS — I509 Heart failure, unspecified: Secondary | ICD-10-CM | POA: Diagnosis not present

## 2012-07-06 DIAGNOSIS — I10 Essential (primary) hypertension: Secondary | ICD-10-CM | POA: Insufficient documentation

## 2012-07-06 DIAGNOSIS — I714 Abdominal aortic aneurysm, without rupture, unspecified: Secondary | ICD-10-CM | POA: Insufficient documentation

## 2012-07-06 DIAGNOSIS — R1319 Other dysphagia: Secondary | ICD-10-CM

## 2012-07-06 DIAGNOSIS — H9190 Unspecified hearing loss, unspecified ear: Secondary | ICD-10-CM | POA: Diagnosis not present

## 2012-07-06 DIAGNOSIS — Z86010 Personal history of colonic polyps: Secondary | ICD-10-CM

## 2012-07-06 DIAGNOSIS — I5022 Chronic systolic (congestive) heart failure: Secondary | ICD-10-CM | POA: Diagnosis not present

## 2012-07-06 DIAGNOSIS — K5900 Constipation, unspecified: Secondary | ICD-10-CM

## 2012-07-06 DIAGNOSIS — I428 Other cardiomyopathies: Secondary | ICD-10-CM | POA: Insufficient documentation

## 2012-07-06 DIAGNOSIS — J449 Chronic obstructive pulmonary disease, unspecified: Secondary | ICD-10-CM | POA: Diagnosis not present

## 2012-07-06 DIAGNOSIS — J4489 Other specified chronic obstructive pulmonary disease: Secondary | ICD-10-CM | POA: Insufficient documentation

## 2012-07-06 DIAGNOSIS — E669 Obesity, unspecified: Secondary | ICD-10-CM | POA: Diagnosis not present

## 2012-07-06 DIAGNOSIS — Z87891 Personal history of nicotine dependence: Secondary | ICD-10-CM | POA: Insufficient documentation

## 2012-07-06 DIAGNOSIS — Z7982 Long term (current) use of aspirin: Secondary | ICD-10-CM | POA: Diagnosis not present

## 2012-07-06 DIAGNOSIS — R1310 Dysphagia, unspecified: Secondary | ICD-10-CM | POA: Insufficient documentation

## 2012-07-06 HISTORY — PX: ESOPHAGOGASTRODUODENOSCOPY: SHX5428

## 2012-07-06 HISTORY — PX: SAVORY DILATION: SHX5439

## 2012-07-06 SURGERY — EGD (ESOPHAGOGASTRODUODENOSCOPY)
Anesthesia: Moderate Sedation

## 2012-07-06 MED ORDER — DIPHENHYDRAMINE HCL 50 MG/ML IJ SOLN
INTRAMUSCULAR | Status: DC | PRN
  Administered 2012-07-06: 25 mg via INTRAVENOUS

## 2012-07-06 MED ORDER — MIDAZOLAM HCL 10 MG/2ML IJ SOLN
INTRAMUSCULAR | Status: DC | PRN
  Administered 2012-07-06 (×3): 2 mg via INTRAVENOUS
  Administered 2012-07-06: 1.5 mg via INTRAVENOUS

## 2012-07-06 MED ORDER — SODIUM CHLORIDE 0.9 % IV SOLN
INTRAVENOUS | Status: DC
  Administered 2012-07-06: 500 mL via INTRAVENOUS

## 2012-07-06 MED ORDER — FENTANYL CITRATE 0.05 MG/ML IJ SOLN
INTRAMUSCULAR | Status: AC
  Filled 2012-07-06: qty 4

## 2012-07-06 MED ORDER — FENTANYL CITRATE 0.05 MG/ML IJ SOLN
INTRAMUSCULAR | Status: DC | PRN
  Administered 2012-07-06 (×3): 25 ug via INTRAVENOUS

## 2012-07-06 MED ORDER — MIDAZOLAM HCL 10 MG/2ML IJ SOLN
INTRAMUSCULAR | Status: AC
  Filled 2012-07-06: qty 4

## 2012-07-06 MED ORDER — BUTAMBEN-TETRACAINE-BENZOCAINE 2-2-14 % EX AERO
INHALATION_SPRAY | CUTANEOUS | Status: DC | PRN
  Administered 2012-07-06: 2 via TOPICAL

## 2012-07-06 MED ORDER — DIPHENHYDRAMINE HCL 50 MG/ML IJ SOLN
INTRAMUSCULAR | Status: AC
  Filled 2012-07-06: qty 1

## 2012-07-06 NOTE — Op Note (Signed)
[facility] [address] [place], [address]   ENDOSCOPY PROCEDURE REPORT  PATIENT: Stacy Grimes, Stacy Grimes  MR#: [id number] BIRTHDATE: Jun 06, 1950 , 62  yrs. old GENDER: Female ENDOSCOPIST: Hart Carwin, MD REFERRED BY:  Lonie Peak, PA ,Dr Berton Mount, Dr Melvia Heaps PROCEDURE DATE:  07/06/2012 PROCEDURE:  EGD w/ biopsy and Savary dilation of esophagus ASA CLASS:     Class III INDICATIONS:  Dysphagia.to solids and liquids,, O2 dependent COPD, pt takes narcotics MEDICATIONS: These medications were titrated to patient response per physician's verbal order, Fentanyl 75 mcg IV, Versed, Fentanyl-Detailed 7.5 mg IV, and Benadryl 12.5 mg IV TOPICAL ANESTHETIC: Cetacaine Spray  DESCRIPTION OF PROCEDURE: After the risks benefits and alternatives of the procedure were thoroughly explained, informed consent was obtained.  The    endoscope was introduced through the mouth and advanced to the second portion of the duodenum. Without limitations.  The instrument was slowly withdrawn as the mucosa was fully examined.        ESOPHAGUS: The mucosa of the esophagus appeared normal. , lumen was not dilated, z-line was regular,  there was no stricture or spasm, Savary dilators 16 and 17 mm passed without difficulty, there was no blood on the dilators  STOMACH: Mild gastritis (inflammation) was found in the gastric body.  A biopsy was performed using cold forceps.  DUODENUM: The duodenal mucosa showed no abnormalities.  Retroflexed views revealed no abnormalities.     The scope was then withdrawn from the patient and the procedure completed.  COMPLICATIONS: There were no complications. ENDOSCOPIC IMPRESSION: 1.   The mucosa of the esophagus appeared normal ,there was  no endoscopically significant stricture 2.   Gastritis (inflammation) was found in the gastric body; biopsy 3.   The duodenal mucosa showed no abnormalities 4  . Passage of 16 and 17 mm Savary  dilators  RECOMMENDATIONS: 1.  Anti-reflux regimen to be follow 2.  Continue PPI 3. Barium esophagram to assess motility 4. Follow up with Dr Arlyce Dice who had to be out of town today  REPEAT EXAM: no  eSigned:  Hart Carwin, MD 07/06/2012 10:17 AM   CC:  PATIENT NAME:  Shimika, Ames MR#: [id number]

## 2012-07-06 NOTE — Interval H&P Note (Signed)
History and Physical Interval Note:  07/06/2012 9:35 AM  Stacy Grimes  has presented today for surgery, with the diagnosis of dysphgia  The various methods of treatment have been discussed with the patient and family. After consideration of risks, benefits and other options for treatment, the patient has consented to  Procedure(s): ESOPHAGOGASTRODUODENOSCOPY (EGD) (N/A) BALLOON DILATION (N/A) as a surgical intervention .  The patient's history has been reviewed, patient examined, no change in status, stable for surgery.  I have reviewed the patient's chart and labs.  Questions were answered to the patient's satisfaction.     Lina Sar

## 2012-07-06 NOTE — Progress Notes (Signed)
Debbie at [facility] notified of the appt details for 07/09/12 9:30. Patient to be NPO after midnight.  She will notify the patient and add to discharge instructions

## 2012-07-09 ENCOUNTER — Encounter: Payer: Self-pay | Admitting: Internal Medicine

## 2012-07-09 ENCOUNTER — Other Ambulatory Visit (HOSPITAL_COMMUNITY): Payer: Medicare Other

## 2012-07-12 ENCOUNTER — Other Ambulatory Visit: Payer: Self-pay | Admitting: Internal Medicine

## 2012-07-13 ENCOUNTER — Encounter (HOSPITAL_COMMUNITY): Payer: Self-pay | Admitting: Internal Medicine

## 2012-07-13 NOTE — Telephone Encounter (Signed)
..   Requested Prescriptions   Pending Prescriptions Disp Refills  . furosemide (LASIX) 40 MG tablet [Pharmacy Med Name: FUROSEMIDE 40MG      TAB] 60 tablet 1    Sig: TAKE ONE TABLET BY MOUTH TWICE DAILY

## 2012-07-16 ENCOUNTER — Encounter (HOSPITAL_COMMUNITY): Payer: Self-pay | Admitting: *Deleted

## 2012-07-20 NOTE — Progress Notes (Signed)
07-20-12 1230 Echo report Dr. Welton Flakes 12-30-11 placed with Endo chart.W. Kennon Portela

## 2012-07-22 DIAGNOSIS — R0602 Shortness of breath: Secondary | ICD-10-CM | POA: Diagnosis not present

## 2012-07-29 ENCOUNTER — Telehealth: Payer: Self-pay | Admitting: *Deleted

## 2012-07-29 ENCOUNTER — Ambulatory Visit: Payer: Medicare Other | Admitting: Gastroenterology

## 2012-07-29 NOTE — Telephone Encounter (Signed)
Rescheduled pts procedure till 7/15 at 11:30am     Patient aware of the change

## 2012-08-04 ENCOUNTER — Encounter: Payer: Medicare Other | Admitting: Internal Medicine

## 2012-08-06 ENCOUNTER — Encounter: Payer: Self-pay | Admitting: *Deleted

## 2012-08-10 ENCOUNTER — Ambulatory Visit (INDEPENDENT_AMBULATORY_CARE_PROVIDER_SITE_OTHER): Payer: Medicare Other | Admitting: Internal Medicine

## 2012-08-10 ENCOUNTER — Telehealth: Payer: Self-pay

## 2012-08-10 ENCOUNTER — Encounter: Payer: Self-pay | Admitting: Internal Medicine

## 2012-08-10 VITALS — BP 128/55 | HR 88 | Ht 63.0 in | Wt 211.0 lb

## 2012-08-10 DIAGNOSIS — Z95810 Presence of automatic (implantable) cardiac defibrillator: Secondary | ICD-10-CM | POA: Diagnosis not present

## 2012-08-10 DIAGNOSIS — R5381 Other malaise: Secondary | ICD-10-CM

## 2012-08-10 DIAGNOSIS — I10 Essential (primary) hypertension: Secondary | ICD-10-CM | POA: Diagnosis not present

## 2012-08-10 DIAGNOSIS — R531 Weakness: Secondary | ICD-10-CM | POA: Insufficient documentation

## 2012-08-10 DIAGNOSIS — I428 Other cardiomyopathies: Secondary | ICD-10-CM

## 2012-08-10 DIAGNOSIS — R5383 Other fatigue: Secondary | ICD-10-CM | POA: Insufficient documentation

## 2012-08-10 DIAGNOSIS — I5022 Chronic systolic (congestive) heart failure: Secondary | ICD-10-CM | POA: Diagnosis not present

## 2012-08-10 LAB — ICD DEVICE OBSERVATION
AL AMPLITUDE: 5 mv
DEVICE MODEL ICD: 605521
FVT: 0
LV LEAD IMPEDENCE ICD: 650 Ohm
RV LEAD AMPLITUDE: 12 mv
RV LEAD IMPEDENCE ICD: 737.5 Ohm
TOT-0008: 0
TOT-0009: 0
TZAT-0018SLOWVT: NEGATIVE
TZAT-0019SLOWVT: 7.5 V
TZON-0003SLOWVT: 300 ms
TZON-0004SLOWVT: 30
TZON-0005SLOWVT: 6
TZON-0010SLOWVT: 80 ms
TZST-0001SLOWVT: 2
TZST-0001SLOWVT: 3
TZST-0003SLOWVT: 40 J
VENTRICULAR PACING ICD: 98 pct
VF: 0

## 2012-08-10 NOTE — Telephone Encounter (Signed)
Pts hospital appt moved to 08/18/12@10 :30am. Pt to arrive at [facility] at 9:30am. New directions mailed to pt.

## 2012-08-10 NOTE — Assessment & Plan Note (Signed)
The patient's device was interrogated.  The information was reviewed. No changes were made in the programming.    

## 2012-08-10 NOTE — Assessment & Plan Note (Signed)
euvolemic 

## 2012-08-10 NOTE — Patient Instructions (Addendum)
**Note De-Identified  Obfuscation** Remote monitoring is used to monitor your Pacemaker of ICD from home. This monitoring reduces the number of office visits required to check your device to one time per year. It allows [place] to keep an eye on the functioning of your device to ensure it is working properly. You are scheduled for a device check from home on November 16, 2012. You may send your transmission at any time that day. If you have a wireless device, the transmission will be sent automatically. After your physician reviews your transmission, you will receive a postcard with your next transmission date.   Your physician wants you to follow-up in: 12 months. You will receive a reminder letter in the mail two months in advance. If you don't receive a letter, please call our office to schedule the follow-up appointment.

## 2012-08-10 NOTE — Assessment & Plan Note (Signed)
Continue on current medications including beta blockers and ARB. She is also on digoxin; we do not have a level but I again defer this to her primary cardiologist

## 2012-08-10 NOTE — Progress Notes (Signed)
Patient Care Team: Lonie Peak as PCP - General (Physician Assistant)   HPI  Stacy Grimes is a 62 y.o. 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  Stable on current O2 therapy. Apparently her most recent ejection fraction is about 40%.  Her biggest complaint is that she is tired. She has sleep apnea but does not use her CPAP. She denies any recent changes in medications. She's been seeing her PCP and her cardiologist and pulmonologist on a regular basis.   Past Medical History  Diagnosis Date  . Non-ischemic cardiomyopathy   . Chronic systolic heart failure   . Hypertension   . COPD (chronic obstructive pulmonary disease)   . Deafness     left ear  . AAA (abdominal aortic aneurysm)   . Hypothyroidism   . Anxiety   . Depression   . GERD (gastroesophageal reflux disease)   . Biventricular ICD  [facility]     Past Surgical History  Procedure Laterality Date  . Cholecystectomy    . Bladder surgery    . Knee surgery      right  . Wrist surgery      right  . Middle ear surgery    . Cardiac catheterization    . Cardiac defibrillator placement  4 yrs ago    pacemaker  . Abdominal hysterectomy    . Esophagogastroduodenoscopy N/A 07/06/2012    Procedure: ESOPHAGOGASTRODUODENOSCOPY (EGD);  Surgeon: Hart Carwin, MD;  Location: [facility];  Service: Endoscopy;  Laterality: N/A;  . Savory dilation N/A 07/06/2012    Procedure: SAVORY DILATION;  Surgeon: Hart Carwin, MD;  Location: [facility];  Service: Endoscopy;  Laterality: N/A;    Current Outpatient Prescriptions  Medication Sig Dispense Refill  . albuterol (PROVENTIL HFA) 108 (90 BASE) MCG/ACT inhaler Inhale 2 puffs into the lungs every 6 (six) hours as needed.       Marland Kitchen allopurinol (ZYLOPRIM) 300 MG tablet Take 450 mg by mouth daily.      Marland Kitchen ALPRAZolam (XANAX) 0.25 MG tablet Take 0.25 mg by mouth at bedtime as needed for anxiety.        Marland Kitchen aspirin 81 MG EC tablet Take 81 mg by mouth daily.        . budesonide-formoterol (SYMBICORT) 160-4.5 MCG/ACT inhaler Inhale 2 puffs into the lungs daily.       . carvedilol (COREG) 25 MG tablet Take 25 mg by mouth as directed. Take 1/2 tablet in the morning and 1/2 tablet in the evening      . cyclobenzaprine (FLEXERIL) 10 MG tablet Take 10 mg by mouth 3 (three) times daily as needed for muscle spasms.       . digoxin (LANOXIN) 0.125 MG tablet Take 125 mcg by mouth every morning.       . furosemide (LASIX) 40 MG tablet Take 40 mg by mouth 2 (two) times daily.       . furosemide (LASIX) 40 MG tablet TAKE ONE TABLET BY MOUTH TWICE DAILY  60 tablet  1  . gabapentin (NEURONTIN) 600 MG tablet Take 600 mg by mouth 2 (two) times daily.      Marland Kitchen HYDROcodone-acetaminophen (VICODIN) 5-500 MG per tablet Take 1 tablet by mouth every 6 (six) hours as needed.      Marland Kitchen ipratropium-albuterol (DUONEB) 0.5-2.5 (3) MG/3ML SOLN Take 3 mLs by nebulization every 4 (four) hours as needed (for wheezing).       Marland Kitchen  levothyroxine (SYNTHROID, LEVOTHROID) 88 MCG tablet Take 88 mcg by mouth every morning.       . lidocaine (LIDODERM) 5 % Place 1 patch onto the skin daily. Remove & Discard patch within 12 hours or as directed by MD  30 patch  0  . losartan (COZAAR) 50 MG tablet Take 25 mg by mouth daily.      Marland Kitchen omeprazole (PRILOSEC) 20 MG capsule Take 20 mg by mouth every morning.       . roflumilast (DALIRESP) 500 MCG TABS tablet Take 500 mcg by mouth daily.      Marland Kitchen tiotropium (SPIRIVA) 18 MCG inhalation capsule Place 18 mcg into inhaler and inhale daily.       Marland Kitchen venlafaxine (EFFEXOR-XR) 37.5 MG 24 hr capsule Take 75 mg by mouth daily.       Marland Kitchen zolpidem (AMBIEN) 10 MG tablet Take 10 mg by mouth at bedtime as needed for sleep.        No current facility-administered medications for this visit.    No Known Allergies  Review of Systems negative except from HPI and PMH  Physical Exam BP 128/55  Pulse 88  Ht 5\' 3"  (1.6 m)   Wt 211 lb (95.709 kg)  BMI 37.39 kg/m2 Well developed and nourished in no acute distress wearing oxygen HENT normal Neck supple with JVP-flat Clear Device pocket well healed; without hematoma or erythema  Regular rate and rhythm, no murmurs or gallops Abd-soft with active BS No Clubbing cyanosis edema Skin-warm and dry A & Oriented  Grossly normal sensory and motor function    ECG demonstrates sinus rhythm at 80 8P synchronous pacing and a late transition in lead V4 this is unchanged from one year ago  Chest x-ray post implant was reviewed and demonstrated a posteriorly located left ventricular lead Assessment and  Plan

## 2012-08-10 NOTE — Assessment & Plan Note (Signed)
This is her major complaint. She is not compliant with her sleep apnea and I have suggested that this would be a good place to start. There are no new medications to implicate. Her basic lab work shows abnormal LFTs and a mild anemia. I trust that this is being followed by her primary care physician in multiple specialists

## 2012-08-18 ENCOUNTER — Encounter (HOSPITAL_COMMUNITY): Payer: Self-pay | Admitting: Gastroenterology

## 2012-08-18 ENCOUNTER — Ambulatory Visit (HOSPITAL_COMMUNITY): Admission: RE | Admit: 2012-08-18 | Payer: Medicare Other | Source: Ambulatory Visit | Admitting: Gastroenterology

## 2012-08-18 ENCOUNTER — Encounter (HOSPITAL_COMMUNITY): Payer: Self-pay | Admitting: Anesthesiology

## 2012-08-18 HISTORY — DX: Depression, unspecified: F32.A

## 2012-08-18 HISTORY — DX: Hypothyroidism, unspecified: E03.9

## 2012-08-18 HISTORY — DX: Anxiety disorder, unspecified: F41.9

## 2012-08-18 HISTORY — DX: Gastro-esophageal reflux disease without esophagitis: K21.9

## 2012-08-18 HISTORY — DX: Major depressive disorder, single episode, unspecified: F32.9

## 2012-08-18 SURGERY — COLONOSCOPY
Anesthesia: Monitor Anesthesia Care

## 2012-08-18 NOTE — H&P (Signed)
History of Present Illness: Pleasant 62 year old white female with oxygen-dependent COPD, dilated cardiomyopathy, here to schedule followup colonoscopy. Adenomatous colon polyps were removed in 2004 and 2009.  She is also complaining of dysphagia to solids and occasionally liquids. She denies pyrosis. She's on narcotics chronically for back pain and has moderately severe constipation.        Past Medical History   Diagnosis  Date   .  Non-ischemic cardiomyopathy     .  CHF (congestive heart failure)     .  Dilated cardiomyopathy     .  Chronic systolic heart failure     .  Hypertension     .  COPD (chronic obstructive pulmonary disease)     .  Deafness     .  AAA (abdominal aortic aneurysm)         Past Surgical History   Procedure  Laterality  Date   .  Cholecystectomy       .  Bladder surgery       .  Knee surgery           right   .  Wrist surgery           right   .  Middle ear surgery       .  Cardiac catheterization       .  Cardiac defibrillator placement           pacemaker   .  Abdominal hysterectomy          family history includes Breast cancer in her mother; Heart attack in her sisters; Heart disease in her father; Hypertension in her brother and mother; and Stroke in her mother. Current Outpatient Prescriptions   Medication  Sig  Dispense  Refill   .  albuterol (PROVENTIL HFA) 108 (90 BASE) MCG/ACT inhaler  Inhale 2 puffs into the lungs every 6 (six) hours as needed.           Marland Kitchen  allopurinol (ZYLOPRIM) 300 MG tablet  Take 450 mg by mouth daily.         Marland Kitchen  ALPRAZolam (XANAX) 0.25 MG tablet  Take 0.25 mg by mouth at bedtime as needed for anxiety.          Marland Kitchen  aspirin 81 MG EC tablet  Take 81 mg by mouth daily.           .  budesonide-formoterol (SYMBICORT) 160-4.5 MCG/ACT inhaler  Inhale 2 puffs into the lungs daily.           .  carvedilol (COREG) 25 MG tablet  Take 25 mg by mouth as directed. Take 1/2 tablet in the morning and 1/2 tablet  in the evening         .  cyclobenzaprine (FLEXERIL) 10 MG tablet  Take 10 mg by mouth 3 (three) times daily as needed for muscle spasms.          .  digoxin (LANOXIN) 0.125 MG tablet  Take 125 mcg by mouth daily.           .  furosemide (LASIX) 40 MG tablet  Take 40 mg by mouth 2 (two) times daily.          Marland Kitchen  gabapentin (NEURONTIN) 600 MG tablet  Take 600 mg by mouth 2 (two) times daily.         Marland Kitchen  HYDROcodone-acetaminophen (VICODIN) 5-500 MG per tablet  Take 1 tablet by mouth every 6 (six) hours as needed.         Marland Kitchen  ipratropium-albuterol (DUONEB) 0.5-2.5 (3) MG/3ML SOLN  Take 3 mLs by nebulization every 4 (four) hours as needed (for wheezing).          Marland Kitchen  levothyroxine (SYNTHROID, LEVOTHROID) 88 MCG tablet  Take 88 mcg by mouth daily.         Marland Kitchen  lidocaine (LIDODERM) 5 %  Place 1 patch onto the skin daily. Remove & Discard patch within 12 hours or as directed by MD   30 patch   0   .  losartan (COZAAR) 50 MG tablet  Take 25 mg by mouth daily.         Marland Kitchen  omeprazole (PRILOSEC) 20 MG capsule  Take 20 mg by mouth daily.         .  roflumilast (DALIRESP) 500 MCG TABS tablet  Take 500 mcg by mouth daily.         Marland Kitchen  tiotropium (SPIRIVA) 18 MCG inhalation capsule  Place 18 mcg into inhaler and inhale daily.           Marland Kitchen  venlafaxine (EFFEXOR-XR) 37.5 MG 24 hr capsule  Take 75 mg by mouth daily.          Marland Kitchen  zolpidem (AMBIEN) 10 MG tablet  Take 10 mg by mouth at bedtime as needed for sleep.              No current facility-administered medications for this visit.       Allergies as of 07/02/2012   .  (No Known Allergies)       reports that she quit smoking about 10 years ago. Her smoking use included Cigarettes. She smoked 0.00 packs per day. She has never used smokeless tobacco. She reports that she does not drink alcohol or use illicit drugs.         Review of Systems: She complains of chronic back pain. She has baseline dyspnea on exertion. Pertinent positive and negative review of  systems were noted in the above HPI section. All other review of systems were otherwise negative.   Vital signs were reviewed in today's medical record Physical Exam: General: Moderately obese female in no acute distress Skin: anicteric Head: Normocephalic and atraumatic Eyes:  sclerae anicteric, EOMI Ears: Normal auditory acuity Mouth: No deformity or lesions Neck: Supple, no masses or thyromegaly Lungs: Clear throughout to auscultation Heart: Regular rate and rhythm; no murmurs, rubs or bruits Abdomen: Soft, non tender and non distended. No masses, hepatosplenomegaly or hernias noted. Normal Bowel sounds Rectal:deferred Musculoskeletal: Symmetrical with no gross deformities   Skin: No lesions on visible extremities Pulses:  Normal pulses noted Extremities: No clubbing, cyanosis, edema or deformities noted Neurological: Alert oriented x 4, grossly nonfocal Cervical Nodes:  No significant cervical adenopathy Inguinal Nodes: No significant inguinal adenopathy Psychological:  Alert and cooperative. Normal mood and affect                      Dysphagia, unspecified(787.20) - Louis Meckel, MD at 07/02/2012  2:01 PM    Status: Written Related Problem: Dysphagia, unspecified(787.20)           Dysphagia could be do to an early esophageal stricture.   Recommendations #1 upper endoscopy with dilatation as indicated   Risks, alternatives, and complications of the procedure, including bleeding, perforation, and possible need  for surgery, were explained to the patient.  Patient's questions were answered.            Unspecified constipation - Louis Meckel, MD at 07/02/2012  2:01 PM    Status: Written Related Problem: Unspecified constipation           This is very likely due to medicines including narcotics.   Recommendations #1 fiber supplementation and MiraLax as needed         Personal history of colonic polyps - Louis Meckel, MD at 07/02/2012  2:02 PM     Status: Written Related Problem: Personal history of colonic polyps           Plan followup colonoscopy

## 2012-08-18 NOTE — Anesthesia Preprocedure Evaluation (Deleted)
Anesthesia Evaluation  Patient identified by MRN, date of birth, ID band Patient awake    Reviewed: Allergy & Precautions, H&P , NPO status , Patient's Chart, lab work & pertinent test results  Airway       Dental   Pulmonary neg pulmonary ROS, sleep apnea (Noncompliant with CPAP) , COPD (Noncompliant with CPAP) oxygen dependent,          Cardiovascular hypertension, + Peripheral Vascular Disease negative cardio ROS  + Cardiac Defibrillator + Valvular Problems/Murmurs  Nonischemic Cardiomyopathy, chronic CHF, fatigue EF 40%   Neuro/Psych Anxiety Depression Deafness negative neurological ROS     GI/Hepatic Neg liver ROS, GERD-  ,  Endo/Other  negative endocrine ROSHypothyroidism   Renal/GU negative Renal ROS  negative genitourinary   Musculoskeletal negative musculoskeletal ROS (+)   Abdominal   Peds  Hematology negative hematology ROS (+)   Anesthesia Other Findings   Reproductive/Obstetrics                         Anesthesia Physical Anesthesia Plan  ASA: III  Anesthesia Plan: MAC   Post-op Pain Management:    Induction: Intravenous  Airway Management Planned: Simple Face Mask  Additional Equipment:   Intra-op Plan:   Post-operative Plan:   Informed Consent: I have reviewed the patients History and Physical, chart, labs and discussed the procedure including the risks, benefits and alternatives for the proposed anesthesia with the patient or authorized representative who has indicated his/her understanding and acceptance.   Dental advisory given  Plan Discussed with: CRNA  Anesthesia Plan Comments:         Anesthesia Quick Evaluation

## 2012-08-24 ENCOUNTER — Telehealth: Payer: Self-pay | Admitting: Gastroenterology

## 2012-08-24 ENCOUNTER — Other Ambulatory Visit: Payer: Self-pay | Admitting: Gastroenterology

## 2012-08-24 DIAGNOSIS — Z1211 Encounter for screening for malignant neoplasm of colon: Secondary | ICD-10-CM

## 2012-08-24 NOTE — Telephone Encounter (Signed)
Pt was supposed to have colon at [facility] last week. Pt could not keep the prep down, even tried mag citrate and could not keep that down. Pts husband states that they want to know when her procedure will be rescheduled at the hospital and what she can do for the prep. Please advise.

## 2012-08-24 NOTE — Telephone Encounter (Signed)
Pt scheduled for Colon at [facility] 08/31/12@12 :30pm. Case number [id number]. Pt scheduled for previsit 08/26/12@4 :30pm. Pt aware of appt dates and times.

## 2012-08-24 NOTE — Telephone Encounter (Signed)
Patient should have a 2 day prep Day one-clear liquids. One bottle of mag citrate in the afternoon Day 2-Reglan 10 mg at 1 PM. Next citrate one bottle at 2 PM and one bottle at 8 PM Day of procedure tap Water enema until clear

## 2012-08-25 ENCOUNTER — Ambulatory Visit
Admission: RE | Admit: 2012-08-25 | Discharge: 2012-08-25 | Disposition: A | Discharge status: Home / Self Care | Payer: Medicare Other | Source: Ambulatory Visit | Attending: Physician Assistant | Admitting: Physician Assistant

## 2012-08-25 ENCOUNTER — Other Ambulatory Visit: Payer: Self-pay | Admitting: Physician Assistant

## 2012-08-25 DIAGNOSIS — M545 Low back pain: Secondary | ICD-10-CM

## 2012-08-25 DIAGNOSIS — M47817 Spondylosis without myelopathy or radiculopathy, lumbosacral region: Secondary | ICD-10-CM | POA: Diagnosis not present

## 2012-08-25 DIAGNOSIS — Z6837 Body mass index (BMI) 37.0-37.9, adult: Secondary | ICD-10-CM | POA: Diagnosis not present

## 2012-08-26 ENCOUNTER — Ambulatory Visit (AMBULATORY_SURGERY_CENTER): Payer: Medicare Other | Admitting: *Deleted

## 2012-08-26 VITALS — Ht 62.0 in | Wt 212.0 lb

## 2012-08-26 DIAGNOSIS — Z86010 Personal history of colonic polyps: Secondary | ICD-10-CM

## 2012-08-26 MED ORDER — METOCLOPRAMIDE HCL 10 MG PO TABS: 10.0000 mg | ORAL_TABLET | Freq: Once | ORAL | Status: DC

## 2012-08-26 NOTE — Progress Notes (Signed)
Pt on home 02 use, 02 dependent. At 3L ewm No problems with past sedation. ewm Last colon 2009 with tub. Adenomas by kaplan. ewm No egg or soy allergy. ewm Pt doesn't have email for emmi [email or web address]

## 2012-08-31 ENCOUNTER — Encounter (HOSPITAL_COMMUNITY)
Admission: RE | Disposition: A | Discharge status: Home / Self Care | Payer: Self-pay | Source: Ambulatory Visit | Attending: Gastroenterology

## 2012-08-31 ENCOUNTER — Ambulatory Visit (HOSPITAL_COMMUNITY)
Admission: RE | Admit: 2012-08-31 | Discharge: 2012-08-31 | Disposition: A | Discharge status: Home / Self Care | Payer: Medicare Other | Source: Ambulatory Visit | Attending: Gastroenterology | Admitting: Gastroenterology

## 2012-08-31 ENCOUNTER — Encounter (HOSPITAL_COMMUNITY): Payer: Self-pay | Admitting: *Deleted

## 2012-08-31 DIAGNOSIS — J449 Chronic obstructive pulmonary disease, unspecified: Secondary | ICD-10-CM | POA: Insufficient documentation

## 2012-08-31 DIAGNOSIS — Z860100 Personal history of colon polyps, unspecified: Secondary | ICD-10-CM

## 2012-08-31 DIAGNOSIS — K5730 Diverticulosis of large intestine without perforation or abscess without bleeding: Secondary | ICD-10-CM | POA: Insufficient documentation

## 2012-08-31 DIAGNOSIS — J4489 Other specified chronic obstructive pulmonary disease: Secondary | ICD-10-CM | POA: Insufficient documentation

## 2012-08-31 DIAGNOSIS — I428 Other cardiomyopathies: Secondary | ICD-10-CM | POA: Diagnosis not present

## 2012-08-31 DIAGNOSIS — M549 Dorsalgia, unspecified: Secondary | ICD-10-CM | POA: Diagnosis not present

## 2012-08-31 DIAGNOSIS — D126 Benign neoplasm of colon, unspecified: Secondary | ICD-10-CM | POA: Diagnosis not present

## 2012-08-31 DIAGNOSIS — Z1211 Encounter for screening for malignant neoplasm of colon: Secondary | ICD-10-CM

## 2012-08-31 DIAGNOSIS — Z9981 Dependence on supplemental oxygen: Secondary | ICD-10-CM | POA: Diagnosis not present

## 2012-08-31 DIAGNOSIS — K5900 Constipation, unspecified: Secondary | ICD-10-CM | POA: Diagnosis not present

## 2012-08-31 DIAGNOSIS — R1310 Dysphagia, unspecified: Secondary | ICD-10-CM | POA: Insufficient documentation

## 2012-08-31 HISTORY — PX: COLONOSCOPY: SHX5424

## 2012-08-31 HISTORY — DX: Presence of automatic (implantable) cardiac defibrillator: Z95.810

## 2012-08-31 HISTORY — DX: Sleep apnea, unspecified: G47.30

## 2012-08-31 SURGERY — COLONOSCOPY
Anesthesia: Moderate Sedation

## 2012-08-31 MED ORDER — FENTANYL CITRATE 0.05 MG/ML IJ SOLN
INTRAMUSCULAR | Status: AC
  Filled 2012-08-31: qty 4

## 2012-08-31 MED ORDER — DIPHENHYDRAMINE HCL 50 MG/ML IJ SOLN
INTRAMUSCULAR | Status: AC
  Filled 2012-08-31: qty 1

## 2012-08-31 MED ORDER — MIDAZOLAM HCL 5 MG/5ML IJ SOLN
INTRAMUSCULAR | Status: DC | PRN
  Administered 2012-08-31 (×3): 2 mg via INTRAVENOUS

## 2012-08-31 MED ORDER — SODIUM CHLORIDE 0.9 % IV SOLN: INTRAVENOUS | Status: DC

## 2012-08-31 MED ORDER — SODIUM CHLORIDE 0.9 % IV SOLN
INTRAVENOUS | Status: DC
  Administered 2012-08-31: 500 mL via INTRAVENOUS

## 2012-08-31 MED ORDER — DIPHENHYDRAMINE HCL 50 MG/ML IJ SOLN
INTRAMUSCULAR | Status: DC | PRN
  Administered 2012-08-31 (×2): 12.5 mg via INTRAVENOUS

## 2012-08-31 MED ORDER — LIDOCAINE HCL 1 % IJ SOLN
INTRAMUSCULAR | Status: AC
  Filled 2012-08-31: qty 20

## 2012-08-31 MED ORDER — MIDAZOLAM HCL 10 MG/2ML IJ SOLN
INTRAMUSCULAR | Status: AC
  Filled 2012-08-31: qty 4

## 2012-08-31 MED ORDER — FENTANYL CITRATE 0.05 MG/ML IJ SOLN
INTRAMUSCULAR | Status: DC | PRN
  Administered 2012-08-31 (×3): 25 ug via INTRAVENOUS

## 2012-08-31 NOTE — H&P (View-Only) (Signed)
                   History of Present Illness: Stacy Grimes 62-year-old white female with oxygen-dependent COPD, dilated cardiomyopathy, here to schedule followup colonoscopy. Adenomatous colon polyps were removed in 2004 and 2009.  She is also complaining of dysphagia to solids and occasionally liquids. She denies pyrosis. She's on narcotics chronically for back pain and has moderately severe constipation.        Past Medical History   Diagnosis  Date   .  Non-ischemic cardiomyopathy     .  CHF (congestive heart failure)     .  Dilated cardiomyopathy     .  Chronic systolic heart failure     .  Hypertension     .  COPD (chronic obstructive pulmonary disease)     .  Deafness     .  AAA (abdominal aortic aneurysm)         Past Surgical History   Procedure  Laterality  Date   .  Cholecystectomy       .  Bladder surgery       .  Knee surgery           right   .  Wrist surgery           right   .  Middle ear surgery       .  Cardiac catheterization       .  Cardiac defibrillator placement           pacemaker   .  Abdominal hysterectomy          family history includes Breast cancer in her mother; Heart attack in her sisters; Heart disease in her father; Hypertension in her brother and mother; and Stroke in her mother. Current Outpatient Prescriptions   Medication  Sig  Dispense  Refill   .  albuterol (PROVENTIL HFA) 108 (90 BASE) MCG/ACT inhaler  Inhale 2 puffs into the lungs every 6 (six) hours as needed.           .  allopurinol (ZYLOPRIM) 300 MG tablet  Take 450 mg by mouth daily.         .  ALPRAZolam (XANAX) 0.25 MG tablet  Take 0.25 mg by mouth at bedtime as needed for anxiety.          .  aspirin 81 MG EC tablet  Take 81 mg by mouth daily.           .  budesonide-formoterol (SYMBICORT) 160-4.5 MCG/ACT inhaler  Inhale 2 puffs into the lungs daily.           .  carvedilol (COREG) 25 MG tablet  Take 25 mg by mouth as directed. Take 1/2 tablet in the morning and 1/2 tablet  in the evening         .  cyclobenzaprine (FLEXERIL) 10 MG tablet  Take 10 mg by mouth 3 (three) times daily as needed for muscle spasms.          .  digoxin (LANOXIN) 0.125 MG tablet  Take 125 mcg by mouth daily.           .  furosemide (LASIX) 40 MG tablet  Take 40 mg by mouth 2 (two) times daily.          .  gabapentin (NEURONTIN) 600 MG tablet  Take 600 mg by mouth 2 (two) times daily.         .    HYDROcodone-acetaminophen (VICODIN) 5-500 MG per tablet  Take 1 tablet by mouth every 6 (six) hours as needed.         .  ipratropium-albuterol (DUONEB) 0.5-2.5 (3) MG/3ML SOLN  Take 3 mLs by nebulization every 4 (four) hours as needed (for wheezing).          .  levothyroxine (SYNTHROID, LEVOTHROID) 88 MCG tablet  Take 88 mcg by mouth daily.         .  lidocaine (LIDODERM) 5 %  Place 1 patch onto the skin daily. Remove & Discard patch within 12 hours or as directed by MD   30 patch   0   .  losartan (COZAAR) 50 MG tablet  Take 25 mg by mouth daily.         .  omeprazole (PRILOSEC) 20 MG capsule  Take 20 mg by mouth daily.         .  roflumilast (DALIRESP) 500 MCG TABS tablet  Take 500 mcg by mouth daily.         .  tiotropium (SPIRIVA) 18 MCG inhalation capsule  Place 18 mcg into inhaler and inhale daily.           .  venlafaxine (EFFEXOR-XR) 37.5 MG 24 hr capsule  Take 75 mg by mouth daily.          .  zolpidem (AMBIEN) 10 MG tablet  Take 10 mg by mouth at bedtime as needed for sleep.              No current facility-administered medications for this visit.       Allergies as of 07/02/2012   .  (No Known Allergies)       reports that she quit smoking about 10 years ago. Her smoking use included Cigarettes. She smoked 0.00 packs per day. She has never used smokeless tobacco. She reports that she does not drink alcohol or use illicit drugs.         Review of Systems: She complains of chronic back pain. She has baseline dyspnea on exertion. Pertinent positive and negative review of  systems were noted in the above HPI section. All other review of systems were otherwise negative.   Vital signs were reviewed in today's medical record Physical Exam: General: Moderately obese female in no acute distress Skin: anicteric Head: Normocephalic and atraumatic Eyes:  sclerae anicteric, EOMI Ears: Normal auditory acuity Mouth: No deformity or lesions Neck: Supple, no masses or thyromegaly Lungs: Clear throughout to auscultation Heart: Regular rate and rhythm; no murmurs, rubs or bruits Abdomen: Soft, non tender and non distended. No masses, hepatosplenomegaly or hernias noted. Normal Bowel sounds Rectal:deferred Musculoskeletal: Symmetrical with no gross deformities   Skin: No lesions on visible extremities Pulses:  Normal pulses noted Extremities: No clubbing, cyanosis, edema or deformities noted Neurological: Alert oriented x 4, grossly nonfocal Cervical Nodes:  No significant cervical adenopathy Inguinal Nodes: No significant inguinal adenopathy Psychological:  Alert and cooperative. Normal mood and affect                      Dysphagia, unspecified(787.20) - Redonna Wilbert D Ariani Seier, MD at 07/02/2012  2:01 PM    Status: Written Related Problem: Dysphagia, unspecified(787.20)           Dysphagia could be do to an early esophageal stricture.   Recommendations #1 upper endoscopy with dilatation as indicated   Risks, alternatives, and complications of the procedure, including bleeding, perforation, and possible need   for surgery, were explained to the patient.  Patient's questions were answered.            Unspecified constipation - Dewitt Judice D Jenascia Bumpass, MD at 07/02/2012  2:01 PM    Status: Written Related Problem: Unspecified constipation           This is very likely due to medicines including narcotics.   Recommendations #1 fiber supplementation and MiraLax as needed         Personal history of colonic polyps - Janene Yousuf D Solaris Kram, MD at 07/02/2012  2:02 PM     Status: Written Related Problem: Personal history of colonic polyps           Plan followup colonoscopy           

## 2012-08-31 NOTE — Op Note (Signed)
[facility] [address] [place], [address]   COLONOSCOPY PROCEDURE REPORT  PATIENT: Stacy Grimes, Stacy Grimes  MR#: [id number] BIRTHDATE: 07-06-1950 , 62  yrs. old GENDER: Female ENDOSCOPIST: Louis Meckel, MD REFERRED BY: PROCEDURE DATE:  08/31/2012 PROCEDURE:   Colonoscopy with snare polypectomy ASA CLASS:   Class III INDICATIONS:Patient's personal history of adenomatous colon polyps. 2004, 2009 MEDICATIONS: These medications were titrated to patient response per physician's verbal order, Versed 6 mg IV, Fentanyl 75 mcg IV, and Benadryl 25 mg IV  DESCRIPTION OF PROCEDURE:   After the risks benefits and alternatives of the procedure were thoroughly explained, informed consent was obtained.  A digital rectal exam revealed no abnormalities of the rectum.   The     endoscope was introduced through the anus and advanced to the cecum, which was identified by the ileocecal valve. No adverse events experienced.   The quality of the prep was Suprep fair  The instrument was then slowly withdrawn as the colon was fully examined.      COLON FINDINGS: A sessile polyp measuring 3 mm in size was found at the cecum.  A polypectomy was performed.  A polypectomy was performed.  The resection was complete and the polyp tissue was completely retrieved.   A sessile polyp measuring 5 mm in size was found in the ascending colon.  A polypectomy was performed with a cold snare.  The resection was complete and the polyp tissue was completely retrieved.   Moderate diverticulosis was noted in the sigmoid colon.   The colon mucosa was otherwise normal. Retroflexed views revealed no abnormalities. The time to cecum=  . Withdrawal time=20 minutes 0 seconds.  The scope was withdrawn and the procedure completed. COMPLICATIONS: There were no complications.  ENDOSCOPIC IMPRESSION: 1.   Sessile polyp measuring 3 mm in size was found at the cecum; polypectomy was performed; polypectomy was  performed 2.   Sessile polyp measuring 5 mm in size was found in the ascending colon; polypectomy was performed with a cold snare 3.   Moderate diverticulosis was noted in the sigmoid colon 4.   The colon mucosa was otherwise normal  RECOMMENDATIONS: If the polyp(s) removed today are proven to be adenomatous (pre-cancerous) polyps, you will need a repeat colonoscopy in 5 years.  Otherwise you should continue to follow colorectal cancer screening guidelines for "routine risk" patients with colonoscopy in 10 years.  You will receive a letter within 1-2 weeks with the results of your biopsy as well as final recommendations.  Please call my office if you have not received a letter after 3 weeks.   eSigned:  Louis Meckel, MD 08/31/2012 1:33 PM   cc: Lonie Peak, PA, Zelphia Cairo MD, and Duke Salvia, MD    PATIENT NAME:  [name], [name] MR#: [id number]

## 2012-08-31 NOTE — Interval H&P Note (Signed)
History and Physical Interval Note:  08/31/2012 12:36 PM  Stacy Grimes  has presented today for surgery, with the diagnosis of Screening for colon cancer [V76.51]  The various methods of treatment have been discussed with the patient and family. After consideration of risks, benefits and other options for treatment, the patient has consented to  Procedure(s): COLONOSCOPY (N/A) as a surgical intervention .  The patient's history has been reviewed, patient examined, no change in status, stable for surgery.  I have reviewed the patient's chart and labs.  Questions were answered to the patient's satisfaction.    The recent H&P (dated *08/18/12**) was reviewed, the patient was examined and there is no change in the patients condition since that H&P was completed.   Melvia Heaps  08/31/2012, 12:37 PM    Melvia Heaps

## 2012-09-01 ENCOUNTER — Encounter (HOSPITAL_COMMUNITY): Payer: Self-pay | Admitting: Gastroenterology

## 2012-09-01 ENCOUNTER — Encounter: Payer: Self-pay | Admitting: Gastroenterology

## 2012-09-12 ENCOUNTER — Other Ambulatory Visit: Payer: Self-pay | Admitting: Internal Medicine

## 2012-09-30 DIAGNOSIS — M545 Low back pain: Secondary | ICD-10-CM | POA: Diagnosis not present

## 2012-10-09 DIAGNOSIS — Z803 Family history of malignant neoplasm of breast: Secondary | ICD-10-CM | POA: Diagnosis not present

## 2012-10-09 DIAGNOSIS — Z1231 Encounter for screening mammogram for malignant neoplasm of breast: Secondary | ICD-10-CM | POA: Diagnosis not present

## 2012-10-29 DIAGNOSIS — R229 Localized swelling, mass and lump, unspecified: Secondary | ICD-10-CM | POA: Diagnosis not present

## 2012-10-29 DIAGNOSIS — I509 Heart failure, unspecified: Secondary | ICD-10-CM | POA: Diagnosis not present

## 2012-10-29 DIAGNOSIS — J449 Chronic obstructive pulmonary disease, unspecified: Secondary | ICD-10-CM | POA: Diagnosis not present

## 2012-10-29 DIAGNOSIS — I428 Other cardiomyopathies: Secondary | ICD-10-CM | POA: Diagnosis not present

## 2012-10-29 DIAGNOSIS — I2510 Atherosclerotic heart disease of native coronary artery without angina pectoris: Secondary | ICD-10-CM | POA: Diagnosis not present

## 2012-10-29 DIAGNOSIS — R0602 Shortness of breath: Secondary | ICD-10-CM | POA: Diagnosis not present

## 2012-10-29 DIAGNOSIS — J438 Other emphysema: Secondary | ICD-10-CM | POA: Diagnosis not present

## 2012-10-29 DIAGNOSIS — J4489 Other specified chronic obstructive pulmonary disease: Secondary | ICD-10-CM | POA: Diagnosis not present

## 2012-10-29 DIAGNOSIS — M545 Low back pain: Secondary | ICD-10-CM | POA: Diagnosis not present

## 2012-11-02 DIAGNOSIS — E785 Hyperlipidemia, unspecified: Secondary | ICD-10-CM | POA: Diagnosis not present

## 2012-11-02 DIAGNOSIS — Z79899 Other long term (current) drug therapy: Secondary | ICD-10-CM | POA: Diagnosis not present

## 2012-11-02 DIAGNOSIS — I509 Heart failure, unspecified: Secondary | ICD-10-CM | POA: Diagnosis not present

## 2012-11-02 DIAGNOSIS — E039 Hypothyroidism, unspecified: Secondary | ICD-10-CM | POA: Diagnosis not present

## 2012-11-16 ENCOUNTER — Ambulatory Visit (INDEPENDENT_AMBULATORY_CARE_PROVIDER_SITE_OTHER): Payer: Medicare Other | Admitting: *Deleted

## 2012-11-16 DIAGNOSIS — I428 Other cardiomyopathies: Secondary | ICD-10-CM | POA: Diagnosis not present

## 2012-11-16 DIAGNOSIS — I5022 Chronic systolic (congestive) heart failure: Secondary | ICD-10-CM | POA: Diagnosis not present

## 2012-11-16 DIAGNOSIS — Z95810 Presence of automatic (implantable) cardiac defibrillator: Secondary | ICD-10-CM | POA: Diagnosis not present

## 2012-11-18 LAB — REMOTE ICD DEVICE
AL AMPLITUDE: 4.4 mv
AL THRESHOLD: 1.125 V
BAMS-0003: 70 {beats}/min
BRDY-0002RA: 60 {beats}/min
BRDY-0004RA: 120 {beats}/min
LV LEAD IMPEDENCE ICD: 580 Ohm
RV LEAD IMPEDENCE ICD: 430 Ohm
TZAT-0004SLOWVT: 8
TZAT-0012SLOWVT: 200 ms
TZAT-0013SLOWVT: 3
TZAT-0018SLOWVT: NEGATIVE
TZON-0003SLOWVT: 300 ms
TZST-0001SLOWVT: 2
TZST-0001SLOWVT: 3
TZST-0001SLOWVT: 5
TZST-0003SLOWVT: 36 J

## 2012-11-25 ENCOUNTER — Encounter: Payer: Self-pay | Admitting: Internal Medicine

## 2012-11-30 ENCOUNTER — Ambulatory Visit (INDEPENDENT_AMBULATORY_CARE_PROVIDER_SITE_OTHER): Payer: Medicare Other | Admitting: *Deleted

## 2012-11-30 DIAGNOSIS — F341 Dysthymic disorder: Secondary | ICD-10-CM | POA: Diagnosis not present

## 2012-11-30 DIAGNOSIS — I428 Other cardiomyopathies: Secondary | ICD-10-CM

## 2012-11-30 DIAGNOSIS — M545 Low back pain: Secondary | ICD-10-CM | POA: Diagnosis not present

## 2012-11-30 DIAGNOSIS — J310 Chronic rhinitis: Secondary | ICD-10-CM | POA: Diagnosis not present

## 2012-11-30 DIAGNOSIS — Z23 Encounter for immunization: Secondary | ICD-10-CM | POA: Diagnosis not present

## 2012-11-30 DIAGNOSIS — E039 Hypothyroidism, unspecified: Secondary | ICD-10-CM | POA: Diagnosis not present

## 2012-11-30 LAB — REMOTE ICD DEVICE
BAMS-0001: 150 {beats}/min
DEVICE MODEL ICD: 605521
TZAT-0004SLOWVT: 8
TZAT-0013SLOWVT: 3
TZAT-0018SLOWVT: NEGATIVE
TZON-0003SLOWVT: 300 ms
TZST-0001SLOWVT: 2
TZST-0001SLOWVT: 3
TZST-0001SLOWVT: 5
TZST-0003SLOWVT: 36 J
TZST-0003SLOWVT: 40 J

## 2012-12-10 NOTE — Progress Notes (Signed)
Remote defib received  

## 2012-12-11 ENCOUNTER — Encounter: Payer: Self-pay | Admitting: *Deleted

## 2012-12-15 ENCOUNTER — Encounter: Payer: Self-pay | Admitting: Internal Medicine

## 2013-01-13 ENCOUNTER — Encounter: Payer: Self-pay | Admitting: *Deleted

## 2013-01-18 ENCOUNTER — Encounter: Payer: Self-pay | Admitting: Internal Medicine

## 2013-01-21 DIAGNOSIS — L738 Other specified follicular disorders: Secondary | ICD-10-CM | POA: Diagnosis not present

## 2013-01-21 DIAGNOSIS — G609 Hereditary and idiopathic neuropathy, unspecified: Secondary | ICD-10-CM | POA: Diagnosis not present

## 2013-02-16 ENCOUNTER — Encounter: Payer: Self-pay | Admitting: Internal Medicine

## 2013-02-16 ENCOUNTER — Encounter: Payer: Medicare Other | Admitting: *Deleted

## 2013-02-16 DIAGNOSIS — I428 Other cardiomyopathies: Secondary | ICD-10-CM

## 2013-02-16 DIAGNOSIS — I5022 Chronic systolic (congestive) heart failure: Secondary | ICD-10-CM

## 2013-02-16 LAB — MDC_IDC_ENUM_SESS_TYPE_REMOTE
Battery Remaining Percentage: 48 %
Battery Voltage: 2.92 V
Brady Statistic AS VP Percent: 97 %
Brady Statistic RA Percent Paced: 1.1 %
HIGH POWER IMPEDANCE MEASURED VALUE: 51 Ohm
Implantable Pulse Generator Serial Number: 605521
Lead Channel Impedance Value: 580 Ohm
Lead Channel Impedance Value: 590 Ohm
Lead Channel Pacing Threshold Amplitude: 1 V
Lead Channel Pacing Threshold Pulse Width: 0.5 ms
Lead Channel Sensing Intrinsic Amplitude: 12 mV
Lead Channel Setting Pacing Amplitude: 2.5 V
Lead Channel Setting Pacing Pulse Width: 0.5 ms
MDC IDC MSMT BATTERY REMAINING LONGEVITY: 38 mo
MDC IDC MSMT LEADCHNL LV PACING THRESHOLD AMPLITUDE: 1 V
MDC IDC MSMT LEADCHNL LV PACING THRESHOLD PULSEWIDTH: 0.5 ms
MDC IDC MSMT LEADCHNL RA IMPEDANCE VALUE: 350 Ohm
MDC IDC MSMT LEADCHNL RA SENSING INTR AMPL: 5 mV
MDC IDC MSMT LEADCHNL RV PACING THRESHOLD AMPLITUDE: 1 V
MDC IDC MSMT LEADCHNL RV PACING THRESHOLD PULSEWIDTH: 0.8 ms
MDC IDC SESS DTM: 20150113070013
MDC IDC SET LEADCHNL RA PACING AMPLITUDE: 2 V
MDC IDC SET LEADCHNL RV PACING AMPLITUDE: 2.5 V
MDC IDC SET LEADCHNL RV PACING PULSEWIDTH: 0.8 ms
MDC IDC SET LEADCHNL RV SENSING SENSITIVITY: 0.5 mV
MDC IDC STAT BRADY AP VP PERCENT: 1.1 %
MDC IDC STAT BRADY AP VS PERCENT: 1 %
MDC IDC STAT BRADY AS VS PERCENT: 2.2 %
Zone Setting Detection Interval: 250 ms
Zone Setting Detection Interval: 300 ms

## 2013-03-02 DIAGNOSIS — I2510 Atherosclerotic heart disease of native coronary artery without angina pectoris: Secondary | ICD-10-CM | POA: Diagnosis not present

## 2013-03-02 DIAGNOSIS — J438 Other emphysema: Secondary | ICD-10-CM | POA: Diagnosis not present

## 2013-03-02 DIAGNOSIS — R079 Chest pain, unspecified: Secondary | ICD-10-CM | POA: Diagnosis not present

## 2013-03-02 DIAGNOSIS — J449 Chronic obstructive pulmonary disease, unspecified: Secondary | ICD-10-CM | POA: Diagnosis not present

## 2013-03-02 DIAGNOSIS — R0602 Shortness of breath: Secondary | ICD-10-CM | POA: Diagnosis not present

## 2013-03-02 DIAGNOSIS — I509 Heart failure, unspecified: Secondary | ICD-10-CM | POA: Diagnosis not present

## 2013-03-02 DIAGNOSIS — I10 Essential (primary) hypertension: Secondary | ICD-10-CM | POA: Diagnosis not present

## 2013-03-02 DIAGNOSIS — C3490 Malignant neoplasm of unspecified part of unspecified bronchus or lung: Secondary | ICD-10-CM | POA: Diagnosis not present

## 2013-03-02 DIAGNOSIS — I428 Other cardiomyopathies: Secondary | ICD-10-CM | POA: Diagnosis not present

## 2013-03-06 ENCOUNTER — Inpatient Hospital Stay: Payer: Self-pay | Admitting: Surgery

## 2013-03-06 DIAGNOSIS — I509 Heart failure, unspecified: Secondary | ICD-10-CM | POA: Diagnosis not present

## 2013-03-06 DIAGNOSIS — Q438 Other specified congenital malformations of intestine: Secondary | ICD-10-CM | POA: Diagnosis not present

## 2013-03-06 DIAGNOSIS — Z87891 Personal history of nicotine dependence: Secondary | ICD-10-CM | POA: Diagnosis not present

## 2013-03-06 DIAGNOSIS — K352 Acute appendicitis with generalized peritonitis, without abscess: Secondary | ICD-10-CM | POA: Diagnosis not present

## 2013-03-06 DIAGNOSIS — N731 Chronic parametritis and pelvic cellulitis: Secondary | ICD-10-CM | POA: Diagnosis not present

## 2013-03-06 DIAGNOSIS — K5732 Diverticulitis of large intestine without perforation or abscess without bleeding: Secondary | ICD-10-CM | POA: Diagnosis not present

## 2013-03-06 DIAGNOSIS — J438 Other emphysema: Secondary | ICD-10-CM | POA: Diagnosis not present

## 2013-03-06 DIAGNOSIS — I10 Essential (primary) hypertension: Secondary | ICD-10-CM | POA: Diagnosis not present

## 2013-03-06 DIAGNOSIS — R109 Unspecified abdominal pain: Secondary | ICD-10-CM | POA: Diagnosis not present

## 2013-03-06 DIAGNOSIS — F329 Major depressive disorder, single episode, unspecified: Secondary | ICD-10-CM | POA: Diagnosis present

## 2013-03-06 DIAGNOSIS — I2510 Atherosclerotic heart disease of native coronary artery without angina pectoris: Secondary | ICD-10-CM | POA: Diagnosis present

## 2013-03-06 DIAGNOSIS — N7003 Acute salpingitis and oophoritis: Secondary | ICD-10-CM | POA: Diagnosis not present

## 2013-03-06 DIAGNOSIS — I110 Hypertensive heart disease with heart failure: Secondary | ICD-10-CM | POA: Diagnosis not present

## 2013-03-06 DIAGNOSIS — M1990 Unspecified osteoarthritis, unspecified site: Secondary | ICD-10-CM | POA: Diagnosis present

## 2013-03-06 DIAGNOSIS — K37 Unspecified appendicitis: Secondary | ICD-10-CM | POA: Diagnosis not present

## 2013-03-06 DIAGNOSIS — E039 Hypothyroidism, unspecified: Secondary | ICD-10-CM | POA: Diagnosis present

## 2013-03-06 DIAGNOSIS — Z95810 Presence of automatic (implantable) cardiac defibrillator: Secondary | ICD-10-CM | POA: Diagnosis not present

## 2013-03-06 DIAGNOSIS — Z91041 Radiographic dye allergy status: Secondary | ICD-10-CM | POA: Diagnosis not present

## 2013-03-06 DIAGNOSIS — K630 Abscess of intestine: Secondary | ICD-10-CM | POA: Diagnosis not present

## 2013-03-06 DIAGNOSIS — J449 Chronic obstructive pulmonary disease, unspecified: Secondary | ICD-10-CM | POA: Diagnosis not present

## 2013-03-06 DIAGNOSIS — K3580 Unspecified acute appendicitis: Secondary | ICD-10-CM | POA: Diagnosis not present

## 2013-03-06 DIAGNOSIS — F3289 Other specified depressive episodes: Secondary | ICD-10-CM | POA: Diagnosis present

## 2013-03-06 LAB — URINALYSIS, COMPLETE
BLOOD: NEGATIVE
Bilirubin,UR: NEGATIVE
Glucose,UR: NEGATIVE mg/dL (ref 0–75)
KETONE: NEGATIVE
Leukocyte Esterase: NEGATIVE
Nitrite: NEGATIVE
PH: 6 (ref 4.5–8.0)
Protein: NEGATIVE
RBC,UR: 1 /HPF (ref 0–5)
SPECIFIC GRAVITY: 1.012 (ref 1.003–1.030)
Squamous Epithelial: 3
WBC UR: 1 /HPF (ref 0–5)

## 2013-03-06 LAB — CBC WITH DIFFERENTIAL/PLATELET
Basophil #: 0.1 10*3/uL (ref 0.0–0.1)
Basophil %: 0.5 %
Eosinophil #: 0.1 10*3/uL (ref 0.0–0.7)
Eosinophil %: 0.6 %
HCT: 33.2 % — AB (ref 35.0–47.0)
HGB: 10.6 g/dL — ABNORMAL LOW (ref 12.0–16.0)
LYMPHS ABS: 1.4 10*3/uL (ref 1.0–3.6)
Lymphocyte %: 8.8 %
MCH: 28.1 pg (ref 26.0–34.0)
MCHC: 32 g/dL (ref 32.0–36.0)
MCV: 88 fL (ref 80–100)
MONO ABS: 1.4 x10 3/mm — AB (ref 0.2–0.9)
Monocyte %: 8.7 %
NEUTROS ABS: 13.3 10*3/uL — AB (ref 1.4–6.5)
NEUTROS PCT: 81.4 %
Platelet: 268 10*3/uL (ref 150–440)
RBC: 3.77 10*6/uL — ABNORMAL LOW (ref 3.80–5.20)
RDW: 15.5 % — AB (ref 11.5–14.5)
WBC: 16.3 10*3/uL — ABNORMAL HIGH (ref 3.6–11.0)

## 2013-03-06 LAB — COMPREHENSIVE METABOLIC PANEL
ALBUMIN: 2.9 g/dL — AB (ref 3.4–5.0)
ANION GAP: 3 — AB (ref 7–16)
AST: 25 U/L (ref 15–37)
Alkaline Phosphatase: 98 U/L
BILIRUBIN TOTAL: 0.3 mg/dL (ref 0.2–1.0)
BUN: 10 mg/dL (ref 7–18)
CALCIUM: 8.9 mg/dL (ref 8.5–10.1)
CREATININE: 0.92 mg/dL (ref 0.60–1.30)
Chloride: 96 mmol/L — ABNORMAL LOW (ref 98–107)
Co2: 34 mmol/L — ABNORMAL HIGH (ref 21–32)
EGFR (African American): >60
GLUCOSE: 158 mg/dL — AB (ref 65–99)
Osmolality: 269 (ref 275–301)
POTASSIUM: 3.3 mmol/L — AB (ref 3.5–5.1)
SGPT (ALT): 24 U/L (ref 12–78)
SODIUM: 133 mmol/L — AB (ref 136–145)
Total Protein: 8 g/dL (ref 6.4–8.2)

## 2013-03-06 LAB — TROPONIN I: Troponin-I: <0.02 ng/mL

## 2013-03-06 LAB — LIPASE, BLOOD: Lipase: 119 U/L (ref 73–393)

## 2013-03-07 LAB — BASIC METABOLIC PANEL
ANION GAP: 7 (ref 7–16)
BUN: 9 mg/dL (ref 7–18)
CALCIUM: 8.8 mg/dL (ref 8.5–10.1)
CHLORIDE: 99 mmol/L (ref 98–107)
CREATININE: 0.81 mg/dL (ref 0.60–1.30)
Co2: 32 mmol/L (ref 21–32)
EGFR (African American): >60
EGFR (Non-African Amer.): >60
Glucose: 160 mg/dL — ABNORMAL HIGH (ref 65–99)
Osmolality: 278 (ref 275–301)
Potassium: 3.1 mmol/L — ABNORMAL LOW (ref 3.5–5.1)
Sodium: 138 mmol/L (ref 136–145)

## 2013-03-07 LAB — CBC WITH DIFFERENTIAL/PLATELET
Basophil #: 0 10*3/uL (ref 0.0–0.1)
Basophil %: 0 %
EOS ABS: 0 10*3/uL (ref 0.0–0.7)
Eosinophil %: 0 %
HCT: 32.1 % — AB (ref 35.0–47.0)
HGB: 10.3 g/dL — ABNORMAL LOW (ref 12.0–16.0)
LYMPHS ABS: 0.5 10*3/uL — AB (ref 1.0–3.6)
Lymphocyte %: 3.1 %
MCH: 28.4 pg (ref 26.0–34.0)
MCHC: 32.2 g/dL (ref 32.0–36.0)
MCV: 88 fL (ref 80–100)
MONOS PCT: 3.4 %
Monocyte #: 0.6 x10 3/mm (ref 0.2–0.9)
NEUTROS PCT: 93.5 %
Neutrophil #: 15.3 10*3/uL — ABNORMAL HIGH (ref 1.4–6.5)
PLATELETS: 246 10*3/uL (ref 150–440)
RBC: 3.64 10*6/uL — AB (ref 3.80–5.20)
RDW: 15.4 % — AB (ref 11.5–14.5)
WBC: 16.3 10*3/uL — AB (ref 3.6–11.0)

## 2013-03-08 LAB — COMPREHENSIVE METABOLIC PANEL
ALK PHOS: 92 U/L
ANION GAP: 2 — AB (ref 7–16)
Albumin: 2.4 g/dL — ABNORMAL LOW (ref 3.4–5.0)
BUN: 17 mg/dL (ref 7–18)
Bilirubin,Total: 0.2 mg/dL (ref 0.2–1.0)
CHLORIDE: 100 mmol/L (ref 98–107)
CO2: 33 mmol/L — AB (ref 21–32)
CREATININE: 1.39 mg/dL — AB (ref 0.60–1.30)
Calcium, Total: 8.7 mg/dL (ref 8.5–10.1)
EGFR (Non-African Amer.): 40 — ABNORMAL LOW
GFR CALC AF AMER: 47 — AB
GLUCOSE: 120 mg/dL — AB (ref 65–99)
OSMOLALITY: 273 (ref 275–301)
Potassium: 3.2 mmol/L — ABNORMAL LOW (ref 3.5–5.1)
SGOT(AST): 25 U/L (ref 15–37)
SGPT (ALT): 28 U/L (ref 12–78)
SODIUM: 135 mmol/L — AB (ref 136–145)
Total Protein: 7.4 g/dL (ref 6.4–8.2)

## 2013-03-08 LAB — CBC WITH DIFFERENTIAL/PLATELET
BASOS ABS: 0 10*3/uL (ref 0.0–0.1)
BASOS PCT: 0.3 %
EOS ABS: 0 10*3/uL (ref 0.0–0.7)
EOS PCT: 0.2 %
HCT: 28.7 % — AB (ref 35.0–47.0)
HGB: 9.1 g/dL — ABNORMAL LOW (ref 12.0–16.0)
LYMPHS ABS: 2 10*3/uL (ref 1.0–3.6)
LYMPHS PCT: 13.6 %
MCH: 27.8 pg (ref 26.0–34.0)
MCHC: 31.7 g/dL — AB (ref 32.0–36.0)
MCV: 88 fL (ref 80–100)
MONO ABS: 1.3 x10 3/mm — AB (ref 0.2–0.9)
Monocyte %: 8.7 %
NEUTROS PCT: 77.2 %
Neutrophil #: 11.6 10*3/uL — ABNORMAL HIGH (ref 1.4–6.5)
Platelet: 259 10*3/uL (ref 150–440)
RBC: 3.27 10*6/uL — AB (ref 3.80–5.20)
RDW: 15.9 % — AB (ref 11.5–14.5)
WBC: 15.1 10*3/uL — ABNORMAL HIGH (ref 3.6–11.0)

## 2013-03-09 LAB — COMPREHENSIVE METABOLIC PANEL
ALK PHOS: 91 U/L
ALT: 26 U/L (ref 12–78)
AST: 24 U/L (ref 15–37)
Albumin: 2.5 g/dL — ABNORMAL LOW (ref 3.4–5.0)
Anion Gap: 5 — ABNORMAL LOW (ref 7–16)
BUN: 15 mg/dL (ref 7–18)
Bilirubin,Total: 0.2 mg/dL (ref 0.2–1.0)
CHLORIDE: 105 mmol/L (ref 98–107)
CO2: 32 mmol/L (ref 21–32)
Calcium, Total: 8.9 mg/dL (ref 8.5–10.1)
Creatinine: 1.05 mg/dL (ref 0.60–1.30)
EGFR (African American): >60
EGFR (Non-African Amer.): 56 — ABNORMAL LOW
Glucose: 115 mg/dL — ABNORMAL HIGH (ref 65–99)
OSMOLALITY: 285 (ref 275–301)
Potassium: 3.7 mmol/L (ref 3.5–5.1)
SODIUM: 142 mmol/L (ref 136–145)
TOTAL PROTEIN: 7.2 g/dL (ref 6.4–8.2)

## 2013-03-09 LAB — MAGNESIUM: Magnesium: 2.3 mg/dL

## 2013-03-09 LAB — CBC WITH DIFFERENTIAL/PLATELET
BASOS ABS: 0 10*3/uL (ref 0.0–0.1)
Basophil %: 0.5 %
EOS PCT: 4.2 %
Eosinophil #: 0.3 10*3/uL (ref 0.0–0.7)
HCT: 30.5 % — AB (ref 35.0–47.0)
HGB: 9.9 g/dL — ABNORMAL LOW (ref 12.0–16.0)
Lymphocyte #: 2 10*3/uL (ref 1.0–3.6)
Lymphocyte %: 24.8 %
MCH: 28.5 pg (ref 26.0–34.0)
MCHC: 32.3 g/dL (ref 32.0–36.0)
MCV: 88 fL (ref 80–100)
MONO ABS: 0.9 x10 3/mm (ref 0.2–0.9)
MONOS PCT: 11.7 %
NEUTROS ABS: 4.6 10*3/uL (ref 1.4–6.5)
NEUTROS PCT: 58.8 %
PLATELETS: 284 10*3/uL (ref 150–440)
RBC: 3.46 10*6/uL — AB (ref 3.80–5.20)
RDW: 15.5 % — ABNORMAL HIGH (ref 11.5–14.5)
WBC: 7.9 10*3/uL (ref 3.6–11.0)

## 2013-03-10 ENCOUNTER — Encounter: Payer: Self-pay | Admitting: *Deleted

## 2013-03-10 LAB — PATHOLOGY REPORT

## 2013-03-24 ENCOUNTER — Emergency Department: Payer: Self-pay | Admitting: Emergency Medicine

## 2013-03-24 DIAGNOSIS — R109 Unspecified abdominal pain: Secondary | ICD-10-CM | POA: Diagnosis not present

## 2013-03-24 DIAGNOSIS — E669 Obesity, unspecified: Secondary | ICD-10-CM | POA: Diagnosis not present

## 2013-03-24 DIAGNOSIS — Z9889 Other specified postprocedural states: Secondary | ICD-10-CM | POA: Diagnosis not present

## 2013-03-24 DIAGNOSIS — Z79899 Other long term (current) drug therapy: Secondary | ICD-10-CM | POA: Diagnosis not present

## 2013-03-24 DIAGNOSIS — Z87891 Personal history of nicotine dependence: Secondary | ICD-10-CM | POA: Diagnosis not present

## 2013-03-24 DIAGNOSIS — R52 Pain, unspecified: Secondary | ICD-10-CM | POA: Diagnosis not present

## 2013-03-24 LAB — COMPREHENSIVE METABOLIC PANEL
ALBUMIN: 3.4 g/dL (ref 3.4–5.0)
ALK PHOS: 117 U/L
ALT: 22 U/L (ref 12–78)
Anion Gap: 5 — ABNORMAL LOW (ref 7–16)
BUN: 14 mg/dL (ref 7–18)
Bilirubin,Total: 0.2 mg/dL (ref 0.2–1.0)
Calcium, Total: 9.7 mg/dL (ref 8.5–10.1)
Chloride: 101 mmol/L (ref 98–107)
Co2: 33 mmol/L — ABNORMAL HIGH (ref 21–32)
Creatinine: 0.92 mg/dL (ref 0.60–1.30)
EGFR (African American): >60
GLUCOSE: 135 mg/dL — AB (ref 65–99)
Osmolality: 280 (ref 275–301)
POTASSIUM: 3.2 mmol/L — AB (ref 3.5–5.1)
SGOT(AST): 27 U/L (ref 15–37)
Sodium: 139 mmol/L (ref 136–145)
Total Protein: 8.8 g/dL — ABNORMAL HIGH (ref 6.4–8.2)

## 2013-03-24 LAB — CBC WITH DIFFERENTIAL/PLATELET
Basophil #: 0.1 10*3/uL (ref 0.0–0.1)
Basophil %: 0.6 %
Eosinophil #: 0.2 10*3/uL (ref 0.0–0.7)
Eosinophil %: 2.3 %
HCT: 36.4 % (ref 35.0–47.0)
HGB: 11.5 g/dL — AB (ref 12.0–16.0)
LYMPHS ABS: 3.3 10*3/uL (ref 1.0–3.6)
Lymphocyte %: 30.7 %
MCH: 27.5 pg (ref 26.0–34.0)
MCHC: 31.6 g/dL — AB (ref 32.0–36.0)
MCV: 87 fL (ref 80–100)
Monocyte #: 0.9 x10 3/mm (ref 0.2–0.9)
Monocyte %: 8.6 %
Neutrophil #: 6.2 10*3/uL (ref 1.4–6.5)
Neutrophil %: 57.8 %
Platelet: 382 10*3/uL (ref 150–440)
RBC: 4.17 10*6/uL (ref 3.80–5.20)
RDW: 16 % — AB (ref 11.5–14.5)
WBC: 10.7 10*3/uL (ref 3.6–11.0)

## 2013-03-24 LAB — LIPASE, BLOOD: Lipase: 299 U/L (ref 73–393)

## 2013-04-13 DIAGNOSIS — M545 Low back pain, unspecified: Secondary | ICD-10-CM | POA: Diagnosis not present

## 2013-04-13 DIAGNOSIS — I428 Other cardiomyopathies: Secondary | ICD-10-CM | POA: Diagnosis not present

## 2013-04-13 DIAGNOSIS — Z79899 Other long term (current) drug therapy: Secondary | ICD-10-CM | POA: Diagnosis not present

## 2013-04-13 DIAGNOSIS — E538 Deficiency of other specified B group vitamins: Secondary | ICD-10-CM | POA: Diagnosis not present

## 2013-04-13 DIAGNOSIS — G609 Hereditary and idiopathic neuropathy, unspecified: Secondary | ICD-10-CM | POA: Diagnosis not present

## 2013-04-25 ENCOUNTER — Other Ambulatory Visit: Payer: Self-pay | Admitting: Internal Medicine

## 2013-04-28 ENCOUNTER — Other Ambulatory Visit: Payer: Self-pay | Admitting: Surgery

## 2013-04-28 DIAGNOSIS — R109 Unspecified abdominal pain: Secondary | ICD-10-CM | POA: Diagnosis not present

## 2013-04-28 LAB — BASIC METABOLIC PANEL
Anion Gap: 5 — ABNORMAL LOW (ref 7–16)
BUN: 14 mg/dL (ref 7–18)
CHLORIDE: 99 mmol/L (ref 98–107)
CO2: 35 mmol/L — AB (ref 21–32)
Calcium, Total: 9 mg/dL (ref 8.5–10.1)
Creatinine: 1.06 mg/dL (ref 0.60–1.30)
EGFR (African American): >60
EGFR (Non-African Amer.): 56 — ABNORMAL LOW
Glucose: 103 mg/dL — ABNORMAL HIGH (ref 65–99)
Osmolality: 278 (ref 275–301)
Potassium: 3.3 mmol/L — ABNORMAL LOW (ref 3.5–5.1)
Sodium: 139 mmol/L (ref 136–145)

## 2013-04-28 LAB — CBC WITH DIFFERENTIAL/PLATELET
BASOS ABS: 0.1 10*3/uL (ref 0.0–0.1)
BASOS PCT: 1.2 %
Eosinophil #: 0.2 10*3/uL (ref 0.0–0.7)
Eosinophil %: 2.6 %
HCT: 36.1 % (ref 35.0–47.0)
HGB: 11.6 g/dL — ABNORMAL LOW (ref 12.0–16.0)
LYMPHS ABS: 2.6 10*3/uL (ref 1.0–3.6)
LYMPHS PCT: 29.9 %
MCH: 28.2 pg (ref 26.0–34.0)
MCHC: 32.1 g/dL (ref 32.0–36.0)
MCV: 88 fL (ref 80–100)
MONO ABS: 0.9 x10 3/mm (ref 0.2–0.9)
Monocyte %: 10.7 %
Neutrophil #: 4.9 10*3/uL (ref 1.4–6.5)
Neutrophil %: 55.6 %
Platelet: 363 10*3/uL (ref 150–440)
RBC: 4.1 10*6/uL (ref 3.80–5.20)
RDW: 15.9 % — ABNORMAL HIGH (ref 11.5–14.5)
WBC: 8.8 10*3/uL (ref 3.6–11.0)

## 2013-04-30 ENCOUNTER — Ambulatory Visit: Payer: Self-pay | Admitting: Surgery

## 2013-04-30 DIAGNOSIS — Z91041 Radiographic dye allergy status: Secondary | ICD-10-CM | POA: Diagnosis not present

## 2013-04-30 DIAGNOSIS — K7689 Other specified diseases of liver: Secondary | ICD-10-CM | POA: Diagnosis not present

## 2013-04-30 DIAGNOSIS — K5732 Diverticulitis of large intestine without perforation or abscess without bleeding: Secondary | ICD-10-CM | POA: Diagnosis not present

## 2013-05-11 ENCOUNTER — Other Ambulatory Visit: Payer: Self-pay | Admitting: Surgery

## 2013-05-11 DIAGNOSIS — K5732 Diverticulitis of large intestine without perforation or abscess without bleeding: Secondary | ICD-10-CM | POA: Diagnosis not present

## 2013-05-11 LAB — WBC: WBC: 11.9 10*3/uL — AB (ref 3.6–11.0)

## 2013-05-13 DIAGNOSIS — K5732 Diverticulitis of large intestine without perforation or abscess without bleeding: Secondary | ICD-10-CM | POA: Diagnosis not present

## 2013-05-17 DIAGNOSIS — J438 Other emphysema: Secondary | ICD-10-CM | POA: Diagnosis not present

## 2013-05-17 DIAGNOSIS — I509 Heart failure, unspecified: Secondary | ICD-10-CM | POA: Diagnosis not present

## 2013-05-17 DIAGNOSIS — E785 Hyperlipidemia, unspecified: Secondary | ICD-10-CM | POA: Diagnosis not present

## 2013-05-17 DIAGNOSIS — J449 Chronic obstructive pulmonary disease, unspecified: Secondary | ICD-10-CM | POA: Diagnosis not present

## 2013-05-17 DIAGNOSIS — I2510 Atherosclerotic heart disease of native coronary artery without angina pectoris: Secondary | ICD-10-CM | POA: Diagnosis not present

## 2013-05-17 DIAGNOSIS — R079 Chest pain, unspecified: Secondary | ICD-10-CM | POA: Diagnosis not present

## 2013-05-17 DIAGNOSIS — I10 Essential (primary) hypertension: Secondary | ICD-10-CM | POA: Diagnosis not present

## 2013-05-19 DIAGNOSIS — R072 Precordial pain: Secondary | ICD-10-CM | POA: Diagnosis not present

## 2013-05-20 ENCOUNTER — Encounter: Payer: Self-pay | Admitting: Internal Medicine

## 2013-05-20 ENCOUNTER — Ambulatory Visit (INDEPENDENT_AMBULATORY_CARE_PROVIDER_SITE_OTHER): Payer: Medicare Other | Admitting: *Deleted

## 2013-05-20 DIAGNOSIS — I5022 Chronic systolic (congestive) heart failure: Secondary | ICD-10-CM

## 2013-05-20 DIAGNOSIS — I428 Other cardiomyopathies: Secondary | ICD-10-CM | POA: Diagnosis not present

## 2013-05-21 ENCOUNTER — Ambulatory Visit: Payer: Self-pay | Admitting: Internal Medicine

## 2013-05-21 DIAGNOSIS — I10 Essential (primary) hypertension: Secondary | ICD-10-CM | POA: Diagnosis not present

## 2013-05-21 DIAGNOSIS — J449 Chronic obstructive pulmonary disease, unspecified: Secondary | ICD-10-CM | POA: Diagnosis not present

## 2013-05-21 DIAGNOSIS — I428 Other cardiomyopathies: Secondary | ICD-10-CM | POA: Diagnosis not present

## 2013-05-21 DIAGNOSIS — J4489 Other specified chronic obstructive pulmonary disease: Secondary | ICD-10-CM | POA: Diagnosis not present

## 2013-05-21 DIAGNOSIS — R9430 Abnormal result of cardiovascular function study, unspecified: Secondary | ICD-10-CM | POA: Diagnosis not present

## 2013-05-21 DIAGNOSIS — I2510 Atherosclerotic heart disease of native coronary artery without angina pectoris: Secondary | ICD-10-CM | POA: Diagnosis not present

## 2013-05-21 DIAGNOSIS — I509 Heart failure, unspecified: Secondary | ICD-10-CM | POA: Diagnosis not present

## 2013-05-21 DIAGNOSIS — R079 Chest pain, unspecified: Secondary | ICD-10-CM | POA: Diagnosis not present

## 2013-05-23 ENCOUNTER — Other Ambulatory Visit: Payer: Self-pay | Admitting: Internal Medicine

## 2013-05-24 LAB — MDC_IDC_ENUM_SESS_TYPE_REMOTE
Battery Remaining Longevity: 36 mo
Battery Remaining Percentage: 45 %
HighPow Impedance: 55 Ohm
Implantable Pulse Generator Serial Number: 605521
Lead Channel Pacing Threshold Amplitude: 1.125 V
Lead Channel Pacing Threshold Pulse Width: 0.5 ms
Lead Channel Sensing Intrinsic Amplitude: 5 mV
Lead Channel Setting Pacing Amplitude: 2.125
Lead Channel Setting Pacing Pulse Width: 0.8 ms
MDC IDC MSMT BATTERY VOLTAGE: 2.9 V
MDC IDC MSMT LEADCHNL LV IMPEDANCE VALUE: 610 Ohm
MDC IDC MSMT LEADCHNL LV PACING THRESHOLD AMPLITUDE: 1 V
MDC IDC MSMT LEADCHNL RA IMPEDANCE VALUE: 410 Ohm
MDC IDC MSMT LEADCHNL RA PACING THRESHOLD PULSEWIDTH: 0.5 ms
MDC IDC MSMT LEADCHNL RV IMPEDANCE VALUE: 660 Ohm
MDC IDC MSMT LEADCHNL RV PACING THRESHOLD AMPLITUDE: 1 V
MDC IDC MSMT LEADCHNL RV PACING THRESHOLD PULSEWIDTH: 0.8 ms
MDC IDC MSMT LEADCHNL RV SENSING INTR AMPL: 12 mV
MDC IDC SESS DTM: 20150416061641
MDC IDC SET LEADCHNL LV PACING AMPLITUDE: 2.5 V
MDC IDC SET LEADCHNL LV PACING PULSEWIDTH: 0.5 ms
MDC IDC SET LEADCHNL RV PACING AMPLITUDE: 2.5 V
MDC IDC SET LEADCHNL RV SENSING SENSITIVITY: 0.5 mV
MDC IDC SET ZONE DETECTION INTERVAL: 300 ms
MDC IDC STAT BRADY AP VP PERCENT: 1.4 %
MDC IDC STAT BRADY AP VS PERCENT: 1 %
MDC IDC STAT BRADY AS VP PERCENT: 96 %
MDC IDC STAT BRADY AS VS PERCENT: 2.3 %
MDC IDC STAT BRADY RA PERCENT PACED: 1.3 %
Zone Setting Detection Interval: 250 ms

## 2013-05-26 DIAGNOSIS — I2510 Atherosclerotic heart disease of native coronary artery without angina pectoris: Secondary | ICD-10-CM | POA: Diagnosis not present

## 2013-05-26 DIAGNOSIS — I10 Essential (primary) hypertension: Secondary | ICD-10-CM | POA: Diagnosis not present

## 2013-05-26 DIAGNOSIS — R0602 Shortness of breath: Secondary | ICD-10-CM | POA: Diagnosis not present

## 2013-05-26 DIAGNOSIS — J449 Chronic obstructive pulmonary disease, unspecified: Secondary | ICD-10-CM | POA: Diagnosis not present

## 2013-05-26 DIAGNOSIS — I428 Other cardiomyopathies: Secondary | ICD-10-CM | POA: Diagnosis not present

## 2013-05-27 ENCOUNTER — Ambulatory Visit (INDEPENDENT_AMBULATORY_CARE_PROVIDER_SITE_OTHER): Payer: Medicare Other | Admitting: *Deleted

## 2013-05-27 ENCOUNTER — Other Ambulatory Visit: Payer: Self-pay | Admitting: Internal Medicine

## 2013-05-27 ENCOUNTER — Encounter: Payer: Self-pay | Admitting: Internal Medicine

## 2013-05-27 DIAGNOSIS — I428 Other cardiomyopathies: Secondary | ICD-10-CM

## 2013-05-27 DIAGNOSIS — I5022 Chronic systolic (congestive) heart failure: Secondary | ICD-10-CM | POA: Diagnosis not present

## 2013-05-27 LAB — MDC_IDC_ENUM_SESS_TYPE_REMOTE
Battery Voltage: 2.9 V
Brady Statistic AP VS Percent: 1 %
Brady Statistic AS VS Percent: 2.2 %
Date Time Interrogation Session: 20150423093105
HIGH POWER IMPEDANCE MEASURED VALUE: 46 Ohm
Implantable Pulse Generator Serial Number: 605521
Lead Channel Impedance Value: 340 Ohm
Lead Channel Impedance Value: 540 Ohm
Lead Channel Pacing Threshold Amplitude: 1 V
Lead Channel Pacing Threshold Pulse Width: 0.5 ms
Lead Channel Setting Pacing Amplitude: 2.5 V
Lead Channel Setting Pacing Pulse Width: 0.8 ms
MDC IDC MSMT BATTERY REMAINING LONGEVITY: 34 mo
MDC IDC MSMT BATTERY REMAINING PERCENTAGE: 44 %
MDC IDC MSMT LEADCHNL LV PACING THRESHOLD AMPLITUDE: 1 V
MDC IDC MSMT LEADCHNL LV PACING THRESHOLD PULSEWIDTH: 0.5 ms
MDC IDC MSMT LEADCHNL RA PACING THRESHOLD AMPLITUDE: 1.125 V
MDC IDC MSMT LEADCHNL RA SENSING INTR AMPL: 5 mV
MDC IDC MSMT LEADCHNL RV IMPEDANCE VALUE: 590 Ohm
MDC IDC MSMT LEADCHNL RV PACING THRESHOLD PULSEWIDTH: 0.8 ms
MDC IDC MSMT LEADCHNL RV SENSING INTR AMPL: 12 mV
MDC IDC SET LEADCHNL LV PACING AMPLITUDE: 2.5 V
MDC IDC SET LEADCHNL LV PACING PULSEWIDTH: 0.5 ms
MDC IDC SET LEADCHNL RA PACING AMPLITUDE: 2.125
MDC IDC SET LEADCHNL RV SENSING SENSITIVITY: 0.5 mV
MDC IDC SET ZONE DETECTION INTERVAL: 300 ms
MDC IDC STAT BRADY AP VP PERCENT: 1.5 %
MDC IDC STAT BRADY AS VP PERCENT: 96 %
MDC IDC STAT BRADY RA PERCENT PACED: 1.5 %
Zone Setting Detection Interval: 250 ms

## 2013-05-30 ENCOUNTER — Emergency Department: Payer: Self-pay | Admitting: Emergency Medicine

## 2013-05-30 DIAGNOSIS — J449 Chronic obstructive pulmonary disease, unspecified: Secondary | ICD-10-CM | POA: Diagnosis not present

## 2013-05-30 DIAGNOSIS — Z91041 Radiographic dye allergy status: Secondary | ICD-10-CM | POA: Diagnosis not present

## 2013-05-30 DIAGNOSIS — Z886 Allergy status to analgesic agent status: Secondary | ICD-10-CM | POA: Diagnosis not present

## 2013-05-30 DIAGNOSIS — Z950 Presence of cardiac pacemaker: Secondary | ICD-10-CM | POA: Diagnosis not present

## 2013-05-30 DIAGNOSIS — R52 Pain, unspecified: Secondary | ICD-10-CM | POA: Diagnosis not present

## 2013-05-30 DIAGNOSIS — G8929 Other chronic pain: Secondary | ICD-10-CM | POA: Diagnosis not present

## 2013-05-30 DIAGNOSIS — K7689 Other specified diseases of liver: Secondary | ICD-10-CM | POA: Diagnosis not present

## 2013-05-30 DIAGNOSIS — I10 Essential (primary) hypertension: Secondary | ICD-10-CM | POA: Diagnosis not present

## 2013-05-30 DIAGNOSIS — R109 Unspecified abdominal pain: Secondary | ICD-10-CM | POA: Diagnosis not present

## 2013-05-30 LAB — URINALYSIS, COMPLETE
Bacteria: NONE SEEN
Bilirubin,UR: NEGATIVE
Blood: NEGATIVE
Glucose,UR: NEGATIVE mg/dL (ref 0–75)
Hyaline Cast: 1
Ketone: NEGATIVE
LEUKOCYTE ESTERASE: NEGATIVE
NITRITE: NEGATIVE
PH: 7 (ref 4.5–8.0)
PROTEIN: NEGATIVE
RBC,UR: <1 /HPF (ref 0–5)
SPECIFIC GRAVITY: 1.008 (ref 1.003–1.030)
Squamous Epithelial: 1

## 2013-05-30 LAB — CBC WITH DIFFERENTIAL/PLATELET
Basophil #: 0.1 10*3/uL (ref 0.0–0.1)
Basophil %: 1 %
Eosinophil #: 0.2 10*3/uL (ref 0.0–0.7)
Eosinophil %: 2.1 %
HCT: 36.7 % (ref 35.0–47.0)
HGB: 11.9 g/dL — ABNORMAL LOW (ref 12.0–16.0)
Lymphocyte #: 2.2 10*3/uL (ref 1.0–3.6)
Lymphocyte %: 25 %
MCH: 28.4 pg (ref 26.0–34.0)
MCHC: 32.5 g/dL (ref 32.0–36.0)
MCV: 88 fL (ref 80–100)
MONO ABS: 1 x10 3/mm — AB (ref 0.2–0.9)
MONOS PCT: 10.8 %
NEUTROS ABS: 5.4 10*3/uL (ref 1.4–6.5)
Neutrophil %: 61.1 %
PLATELETS: 386 10*3/uL (ref 150–440)
RBC: 4.2 10*6/uL (ref 3.80–5.20)
RDW: 15.8 % — ABNORMAL HIGH (ref 11.5–14.5)
WBC: 8.9 10*3/uL (ref 3.6–11.0)

## 2013-05-30 LAB — TROPONIN I: Troponin-I: <0.02 ng/mL

## 2013-05-30 LAB — COMPREHENSIVE METABOLIC PANEL
ANION GAP: 7 (ref 7–16)
AST: 32 U/L (ref 15–37)
Albumin: 3.5 g/dL (ref 3.4–5.0)
Alkaline Phosphatase: 111 U/L
BUN: 9 mg/dL (ref 7–18)
Bilirubin,Total: 0.2 mg/dL (ref 0.2–1.0)
CALCIUM: 9.1 mg/dL (ref 8.5–10.1)
Chloride: 100 mmol/L (ref 98–107)
Co2: 33 mmol/L — ABNORMAL HIGH (ref 21–32)
Creatinine: 0.76 mg/dL (ref 0.60–1.30)
GLUCOSE: 127 mg/dL — AB (ref 65–99)
Osmolality: 280 (ref 275–301)
Potassium: 3.1 mmol/L — ABNORMAL LOW (ref 3.5–5.1)
SGPT (ALT): 27 U/L (ref 12–78)
Sodium: 140 mmol/L (ref 136–145)
Total Protein: 8.4 g/dL — ABNORMAL HIGH (ref 6.4–8.2)

## 2013-05-30 LAB — LIPASE, BLOOD: LIPASE: 107 U/L (ref 73–393)

## 2013-06-07 ENCOUNTER — Encounter: Payer: Self-pay | Admitting: *Deleted

## 2013-06-07 NOTE — Progress Notes (Signed)
ICD remote with ICM 

## 2013-06-08 DIAGNOSIS — J438 Other emphysema: Secondary | ICD-10-CM | POA: Diagnosis not present

## 2013-06-08 DIAGNOSIS — G4730 Sleep apnea, unspecified: Secondary | ICD-10-CM | POA: Diagnosis not present

## 2013-06-08 DIAGNOSIS — J449 Chronic obstructive pulmonary disease, unspecified: Secondary | ICD-10-CM | POA: Diagnosis not present

## 2013-06-08 DIAGNOSIS — G4710 Hypersomnia, unspecified: Secondary | ICD-10-CM | POA: Diagnosis not present

## 2013-06-09 ENCOUNTER — Encounter: Payer: Self-pay | Admitting: Cardiology

## 2013-06-15 DIAGNOSIS — K5732 Diverticulitis of large intestine without perforation or abscess without bleeding: Secondary | ICD-10-CM | POA: Diagnosis not present

## 2013-06-22 ENCOUNTER — Ambulatory Visit: Payer: Self-pay | Admitting: Surgery

## 2013-06-22 DIAGNOSIS — E669 Obesity, unspecified: Secondary | ICD-10-CM | POA: Diagnosis not present

## 2013-06-22 DIAGNOSIS — I10 Essential (primary) hypertension: Secondary | ICD-10-CM | POA: Diagnosis not present

## 2013-06-22 DIAGNOSIS — Z886 Allergy status to analgesic agent status: Secondary | ICD-10-CM | POA: Diagnosis not present

## 2013-06-22 DIAGNOSIS — Z87891 Personal history of nicotine dependence: Secondary | ICD-10-CM | POA: Diagnosis not present

## 2013-06-22 DIAGNOSIS — Z9889 Other specified postprocedural states: Secondary | ICD-10-CM | POA: Diagnosis not present

## 2013-06-22 DIAGNOSIS — Z01812 Encounter for preprocedural laboratory examination: Secondary | ICD-10-CM | POA: Diagnosis not present

## 2013-06-22 DIAGNOSIS — J449 Chronic obstructive pulmonary disease, unspecified: Secondary | ICD-10-CM | POA: Diagnosis not present

## 2013-06-22 DIAGNOSIS — I509 Heart failure, unspecified: Secondary | ICD-10-CM | POA: Diagnosis not present

## 2013-06-22 DIAGNOSIS — R109 Unspecified abdominal pain: Secondary | ICD-10-CM | POA: Diagnosis not present

## 2013-06-22 DIAGNOSIS — K5732 Diverticulitis of large intestine without perforation or abscess without bleeding: Secondary | ICD-10-CM | POA: Diagnosis not present

## 2013-06-22 DIAGNOSIS — Z7982 Long term (current) use of aspirin: Secondary | ICD-10-CM | POA: Diagnosis not present

## 2013-06-22 DIAGNOSIS — Z79899 Other long term (current) drug therapy: Secondary | ICD-10-CM | POA: Diagnosis not present

## 2013-06-22 DIAGNOSIS — Z95810 Presence of automatic (implantable) cardiac defibrillator: Secondary | ICD-10-CM | POA: Diagnosis not present

## 2013-06-22 LAB — BASIC METABOLIC PANEL
ANION GAP: 4 — AB (ref 7–16)
BUN: 12 mg/dL (ref 7–18)
CALCIUM: 10 mg/dL (ref 8.5–10.1)
CO2: 38 mmol/L — AB (ref 21–32)
Chloride: 98 mmol/L (ref 98–107)
Creatinine: 0.79 mg/dL (ref 0.60–1.30)
EGFR (Non-African Amer.): >60
GLUCOSE: 103 mg/dL — AB (ref 65–99)
Osmolality: 279 (ref 275–301)
Potassium: 3.6 mmol/L (ref 3.5–5.1)
Sodium: 140 mmol/L (ref 136–145)

## 2013-06-22 LAB — CBC WITH DIFFERENTIAL/PLATELET
Basophil #: 0.1 10*3/uL (ref 0.0–0.1)
Basophil %: 0.9 %
Eosinophil #: 0.2 10*3/uL (ref 0.0–0.7)
Eosinophil %: 3 %
HCT: 37.3 % (ref 35.0–47.0)
HGB: 11.9 g/dL — ABNORMAL LOW (ref 12.0–16.0)
LYMPHS ABS: 2.8 10*3/uL (ref 1.0–3.6)
LYMPHS PCT: 34.6 %
MCH: 28 pg (ref 26.0–34.0)
MCHC: 31.9 g/dL — AB (ref 32.0–36.0)
MCV: 88 fL (ref 80–100)
Monocyte #: 0.9 x10 3/mm (ref 0.2–0.9)
Monocyte %: 10.9 %
NEUTROS ABS: 4 10*3/uL (ref 1.4–6.5)
NEUTROS PCT: 50.6 %
Platelet: 317 10*3/uL (ref 150–440)
RBC: 4.25 10*6/uL (ref 3.80–5.20)
RDW: 15.3 % — ABNORMAL HIGH (ref 11.5–14.5)
WBC: 8 10*3/uL (ref 3.6–11.0)

## 2013-06-22 LAB — HEPATIC FUNCTION PANEL A (ARMC)
ALT: 25 U/L (ref 12–78)
Albumin: 3.7 g/dL (ref 3.4–5.0)
Alkaline Phosphatase: 104 U/L
BILIRUBIN TOTAL: 0.2 mg/dL (ref 0.2–1.0)
Bilirubin, Direct: <0.1 mg/dL (ref 0.00–0.20)
SGOT(AST): 21 U/L (ref 15–37)
Total Protein: 8.9 g/dL — ABNORMAL HIGH (ref 6.4–8.2)

## 2013-06-29 ENCOUNTER — Inpatient Hospital Stay: Payer: Self-pay | Admitting: Surgery

## 2013-06-29 DIAGNOSIS — J9610 Chronic respiratory failure, unspecified whether with hypoxia or hypercapnia: Secondary | ICD-10-CM | POA: Diagnosis present

## 2013-06-29 DIAGNOSIS — Z9981 Dependence on supplemental oxygen: Secondary | ICD-10-CM | POA: Diagnosis not present

## 2013-06-29 DIAGNOSIS — K660 Peritoneal adhesions (postprocedural) (postinfection): Secondary | ICD-10-CM | POA: Diagnosis present

## 2013-06-29 DIAGNOSIS — Z6835 Body mass index (BMI) 35.0-35.9, adult: Secondary | ICD-10-CM | POA: Diagnosis not present

## 2013-06-29 DIAGNOSIS — E669 Obesity, unspecified: Secondary | ICD-10-CM | POA: Diagnosis present

## 2013-06-29 DIAGNOSIS — J449 Chronic obstructive pulmonary disease, unspecified: Secondary | ICD-10-CM | POA: Diagnosis present

## 2013-06-29 DIAGNOSIS — Z95810 Presence of automatic (implantable) cardiac defibrillator: Secondary | ICD-10-CM | POA: Diagnosis not present

## 2013-06-29 DIAGNOSIS — I509 Heart failure, unspecified: Secondary | ICD-10-CM | POA: Diagnosis present

## 2013-06-29 DIAGNOSIS — K5732 Diverticulitis of large intestine without perforation or abscess without bleeding: Secondary | ICD-10-CM | POA: Diagnosis not present

## 2013-06-29 DIAGNOSIS — I10 Essential (primary) hypertension: Secondary | ICD-10-CM | POA: Diagnosis present

## 2013-06-29 DIAGNOSIS — I5022 Chronic systolic (congestive) heart failure: Secondary | ICD-10-CM | POA: Diagnosis not present

## 2013-06-30 LAB — BASIC METABOLIC PANEL
Anion Gap: 5 — ABNORMAL LOW (ref 7–16)
BUN: 9 mg/dL (ref 7–18)
CALCIUM: 9.1 mg/dL (ref 8.5–10.1)
CO2: 34 mmol/L — AB (ref 21–32)
Chloride: 100 mmol/L (ref 98–107)
Creatinine: 0.78 mg/dL (ref 0.60–1.30)
EGFR (African American): >60
EGFR (Non-African Amer.): >60
Glucose: 148 mg/dL — ABNORMAL HIGH (ref 65–99)
Osmolality: 279 (ref 275–301)
POTASSIUM: 3 mmol/L — AB (ref 3.5–5.1)
Sodium: 139 mmol/L (ref 136–145)

## 2013-06-30 LAB — CBC WITH DIFFERENTIAL/PLATELET
BASOS ABS: 0 10*3/uL (ref 0.0–0.1)
BASOS PCT: 0.2 %
EOS PCT: 0.1 %
Eosinophil #: 0 10*3/uL (ref 0.0–0.7)
HCT: 32.8 % — AB (ref 35.0–47.0)
HGB: 10.3 g/dL — ABNORMAL LOW (ref 12.0–16.0)
LYMPHS ABS: 1.4 10*3/uL (ref 1.0–3.6)
LYMPHS PCT: 11.8 %
MCH: 28 pg (ref 26.0–34.0)
MCHC: 31.6 g/dL — ABNORMAL LOW (ref 32.0–36.0)
MCV: 89 fL (ref 80–100)
MONO ABS: 1.8 x10 3/mm — AB (ref 0.2–0.9)
MONOS PCT: 14.6 %
NEUTROS ABS: 9 10*3/uL — AB (ref 1.4–6.5)
Neutrophil %: 73.3 %
PLATELETS: 293 10*3/uL (ref 150–440)
RBC: 3.69 10*6/uL — AB (ref 3.80–5.20)
RDW: 15.4 % — AB (ref 11.5–14.5)
WBC: 12.3 10*3/uL — ABNORMAL HIGH (ref 3.6–11.0)

## 2013-06-30 LAB — PATHOLOGY REPORT

## 2013-07-01 ENCOUNTER — Ambulatory Visit (INDEPENDENT_AMBULATORY_CARE_PROVIDER_SITE_OTHER): Payer: Medicare Other | Admitting: *Deleted

## 2013-07-01 DIAGNOSIS — I5022 Chronic systolic (congestive) heart failure: Secondary | ICD-10-CM

## 2013-07-01 DIAGNOSIS — Z95810 Presence of automatic (implantable) cardiac defibrillator: Secondary | ICD-10-CM | POA: Diagnosis not present

## 2013-07-01 LAB — BASIC METABOLIC PANEL
Anion Gap: 1 — ABNORMAL LOW (ref 7–16)
BUN: 7 mg/dL (ref 7–18)
CALCIUM: 8.4 mg/dL — AB (ref 8.5–10.1)
CHLORIDE: 103 mmol/L (ref 98–107)
CREATININE: 0.79 mg/dL (ref 0.60–1.30)
Co2: 35 mmol/L — ABNORMAL HIGH (ref 21–32)
EGFR (African American): >60
GLUCOSE: 135 mg/dL — AB (ref 65–99)
OSMOLALITY: 278 (ref 275–301)
POTASSIUM: 3.4 mmol/L — AB (ref 3.5–5.1)
Sodium: 139 mmol/L (ref 136–145)

## 2013-07-01 LAB — CBC WITH DIFFERENTIAL/PLATELET
Basophil #: 0.1 10*3/uL (ref 0.0–0.1)
Basophil %: 0.4 %
EOS ABS: 0.2 10*3/uL (ref 0.0–0.7)
EOS PCT: 1.2 %
HCT: 29.2 % — AB (ref 35.0–47.0)
HGB: 9.1 g/dL — AB (ref 12.0–16.0)
LYMPHS PCT: 12.7 %
Lymphocyte #: 1.9 10*3/uL (ref 1.0–3.6)
MCH: 28.2 pg (ref 26.0–34.0)
MCHC: 31.3 g/dL — ABNORMAL LOW (ref 32.0–36.0)
MCV: 90 fL (ref 80–100)
MONO ABS: 1.5 x10 3/mm — AB (ref 0.2–0.9)
MONOS PCT: 9.6 %
Neutrophil #: 11.5 10*3/uL — ABNORMAL HIGH (ref 1.4–6.5)
Neutrophil %: 76.1 %
PLATELETS: 255 10*3/uL (ref 150–440)
RBC: 3.24 10*6/uL — AB (ref 3.80–5.20)
RDW: 15.6 % — AB (ref 11.5–14.5)
WBC: 15.1 10*3/uL — ABNORMAL HIGH (ref 3.6–11.0)

## 2013-07-04 LAB — CBC WITH DIFFERENTIAL/PLATELET
BASOS ABS: 0 10*3/uL (ref 0.0–0.1)
BASOS PCT: 0.4 %
Eosinophil #: 0.3 10*3/uL (ref 0.0–0.7)
Eosinophil %: 2.4 %
HCT: 26.3 % — AB (ref 35.0–47.0)
HGB: 8.3 g/dL — AB (ref 12.0–16.0)
LYMPHS ABS: 1.9 10*3/uL (ref 1.0–3.6)
LYMPHS PCT: 14.9 %
MCH: 28.1 pg (ref 26.0–34.0)
MCHC: 31.6 g/dL — ABNORMAL LOW (ref 32.0–36.0)
MCV: 89 fL (ref 80–100)
Monocyte #: 1.5 x10 3/mm — ABNORMAL HIGH (ref 0.2–0.9)
Monocyte %: 12.2 %
NEUTROS ABS: 8.8 10*3/uL — AB (ref 1.4–6.5)
NEUTROS PCT: 70.1 %
Platelet: 275 10*3/uL (ref 150–440)
RBC: 2.96 10*6/uL — AB (ref 3.80–5.20)
RDW: 14.9 % — ABNORMAL HIGH (ref 11.5–14.5)
WBC: 12.6 10*3/uL — AB (ref 3.6–11.0)

## 2013-07-05 LAB — CBC WITH DIFFERENTIAL/PLATELET
Basophil #: 0 10*3/uL (ref 0.0–0.1)
Basophil %: 0.5 %
EOS ABS: 0.4 10*3/uL (ref 0.0–0.7)
Eosinophil %: 4.2 %
HCT: 25.9 % — ABNORMAL LOW (ref 35.0–47.0)
HGB: 8.1 g/dL — AB (ref 12.0–16.0)
LYMPHS ABS: 1.3 10*3/uL (ref 1.0–3.6)
LYMPHS PCT: 13.2 %
MCH: 28.2 pg (ref 26.0–34.0)
MCHC: 31.4 g/dL — ABNORMAL LOW (ref 32.0–36.0)
MCV: 90 fL (ref 80–100)
MONOS PCT: 11.9 %
Monocyte #: 1.2 x10 3/mm — ABNORMAL HIGH (ref 0.2–0.9)
NEUTROS ABS: 7 10*3/uL — AB (ref 1.4–6.5)
Neutrophil %: 70.2 %
Platelet: 253 10*3/uL (ref 150–440)
RBC: 2.88 10*6/uL — ABNORMAL LOW (ref 3.80–5.20)
RDW: 14.9 % — ABNORMAL HIGH (ref 11.5–14.5)
WBC: 10 10*3/uL (ref 3.6–11.0)

## 2013-07-08 ENCOUNTER — Encounter: Payer: Self-pay | Admitting: *Deleted

## 2013-07-08 DIAGNOSIS — T8140XA Infection following a procedure, unspecified, initial encounter: Secondary | ICD-10-CM | POA: Diagnosis not present

## 2013-07-08 DIAGNOSIS — J449 Chronic obstructive pulmonary disease, unspecified: Secondary | ICD-10-CM | POA: Diagnosis not present

## 2013-07-08 DIAGNOSIS — I10 Essential (primary) hypertension: Secondary | ICD-10-CM | POA: Diagnosis not present

## 2013-07-08 DIAGNOSIS — I509 Heart failure, unspecified: Secondary | ICD-10-CM | POA: Diagnosis not present

## 2013-07-08 NOTE — Progress Notes (Signed)
EPIC Encounter for ICM Monitoring  Patient Name: Stacy Grimes is a 63 y.o. female Date: 07/08/2013 Primary Care Physican: Fae Pippin Primary Cardiologist: Caryl Comes Electrophysiologist: Caryl Comes Dry Weight: 192 lbs  Bi-V pacing: 97%       In the past month, have you:  1. Gained more than 2 pounds in a day or more than 5 pounds in a week? no  2. Had changes in your medications (with verification of current medications)? no  3. Had more shortness of breath than is usual for you? no  4. Limited your activity because of shortness of breath? no  5. Not been able to sleep because of shortness of breath? no  6. Had increased swelling in your feet or ankles? no  7. Had symptoms of dehydration (dizziness, dry mouth, increased thirst, decreased urine output) no  8. Had changes in sodium restriction? no  9. Been compliant with medication? Yes  ** I have been trying to reach the patient since last week to discuss her transmission. She states today that she was just released from [facility] due to diverticulitis and surgery to remove part of her colon. She is due to go back today for a follow up, but she thinks she may cancel her follow up as she does not feel well enough to go. **   ICM trend:   Follow-up plan: ICM clinic phone appointment: 09/20/13. Yearly follow up already scheduled with Dr. Caryl Comes for 08/18/13.  Copy of note sent to patient's primary care physician, primary cardiologist, and device following physician.  Emily Filbert, RN, BSN 07/08/2013 12:18 PM

## 2013-07-08 NOTE — Addendum Note (Signed)
Addended by: Alvis Lemmings C on: 07/08/2013 12:40 PM   Modules accepted: Orders

## 2013-07-13 DIAGNOSIS — J449 Chronic obstructive pulmonary disease, unspecified: Secondary | ICD-10-CM | POA: Diagnosis not present

## 2013-07-13 DIAGNOSIS — I509 Heart failure, unspecified: Secondary | ICD-10-CM | POA: Diagnosis not present

## 2013-07-13 DIAGNOSIS — I10 Essential (primary) hypertension: Secondary | ICD-10-CM | POA: Diagnosis not present

## 2013-07-13 DIAGNOSIS — T8140XA Infection following a procedure, unspecified, initial encounter: Secondary | ICD-10-CM | POA: Diagnosis not present

## 2013-07-15 DIAGNOSIS — I10 Essential (primary) hypertension: Secondary | ICD-10-CM | POA: Diagnosis not present

## 2013-07-15 DIAGNOSIS — J449 Chronic obstructive pulmonary disease, unspecified: Secondary | ICD-10-CM | POA: Diagnosis not present

## 2013-07-15 DIAGNOSIS — T8140XA Infection following a procedure, unspecified, initial encounter: Secondary | ICD-10-CM | POA: Diagnosis not present

## 2013-07-15 DIAGNOSIS — I509 Heart failure, unspecified: Secondary | ICD-10-CM | POA: Diagnosis not present

## 2013-07-22 DIAGNOSIS — J449 Chronic obstructive pulmonary disease, unspecified: Secondary | ICD-10-CM | POA: Diagnosis not present

## 2013-07-22 DIAGNOSIS — T8140XA Infection following a procedure, unspecified, initial encounter: Secondary | ICD-10-CM | POA: Diagnosis not present

## 2013-07-22 DIAGNOSIS — I509 Heart failure, unspecified: Secondary | ICD-10-CM | POA: Diagnosis not present

## 2013-07-22 DIAGNOSIS — I10 Essential (primary) hypertension: Secondary | ICD-10-CM | POA: Diagnosis not present

## 2013-07-26 DIAGNOSIS — R109 Unspecified abdominal pain: Secondary | ICD-10-CM | POA: Diagnosis not present

## 2013-07-29 DIAGNOSIS — I10 Essential (primary) hypertension: Secondary | ICD-10-CM | POA: Diagnosis not present

## 2013-07-29 DIAGNOSIS — T8140XA Infection following a procedure, unspecified, initial encounter: Secondary | ICD-10-CM | POA: Diagnosis not present

## 2013-07-29 DIAGNOSIS — J449 Chronic obstructive pulmonary disease, unspecified: Secondary | ICD-10-CM | POA: Diagnosis not present

## 2013-07-29 DIAGNOSIS — I509 Heart failure, unspecified: Secondary | ICD-10-CM | POA: Diagnosis not present

## 2013-07-31 ENCOUNTER — Inpatient Hospital Stay: Payer: Self-pay | Admitting: Internal Medicine

## 2013-07-31 DIAGNOSIS — R0602 Shortness of breath: Secondary | ICD-10-CM | POA: Diagnosis not present

## 2013-07-31 DIAGNOSIS — I509 Heart failure, unspecified: Secondary | ICD-10-CM | POA: Diagnosis not present

## 2013-07-31 DIAGNOSIS — K7689 Other specified diseases of liver: Secondary | ICD-10-CM | POA: Diagnosis not present

## 2013-07-31 DIAGNOSIS — IMO0002 Reserved for concepts with insufficient information to code with codable children: Secondary | ICD-10-CM | POA: Diagnosis not present

## 2013-07-31 DIAGNOSIS — E039 Hypothyroidism, unspecified: Secondary | ICD-10-CM | POA: Diagnosis not present

## 2013-07-31 DIAGNOSIS — E669 Obesity, unspecified: Secondary | ICD-10-CM | POA: Diagnosis present

## 2013-07-31 DIAGNOSIS — Z87891 Personal history of nicotine dependence: Secondary | ICD-10-CM | POA: Diagnosis not present

## 2013-07-31 DIAGNOSIS — Z95810 Presence of automatic (implantable) cardiac defibrillator: Secondary | ICD-10-CM | POA: Diagnosis not present

## 2013-07-31 DIAGNOSIS — Z7982 Long term (current) use of aspirin: Secondary | ICD-10-CM | POA: Diagnosis not present

## 2013-07-31 DIAGNOSIS — J449 Chronic obstructive pulmonary disease, unspecified: Secondary | ICD-10-CM | POA: Diagnosis not present

## 2013-07-31 DIAGNOSIS — F3289 Other specified depressive episodes: Secondary | ICD-10-CM | POA: Diagnosis present

## 2013-07-31 DIAGNOSIS — I428 Other cardiomyopathies: Secondary | ICD-10-CM | POA: Diagnosis present

## 2013-07-31 DIAGNOSIS — I10 Essential (primary) hypertension: Secondary | ICD-10-CM | POA: Diagnosis present

## 2013-07-31 DIAGNOSIS — R05 Cough: Secondary | ICD-10-CM | POA: Diagnosis not present

## 2013-07-31 DIAGNOSIS — I5022 Chronic systolic (congestive) heart failure: Secondary | ICD-10-CM | POA: Diagnosis present

## 2013-07-31 DIAGNOSIS — F411 Generalized anxiety disorder: Secondary | ICD-10-CM | POA: Diagnosis present

## 2013-07-31 DIAGNOSIS — G2581 Restless legs syndrome: Secondary | ICD-10-CM | POA: Diagnosis present

## 2013-07-31 DIAGNOSIS — R61 Generalized hyperhidrosis: Secondary | ICD-10-CM | POA: Diagnosis not present

## 2013-07-31 DIAGNOSIS — R509 Fever, unspecified: Secondary | ICD-10-CM | POA: Diagnosis not present

## 2013-07-31 DIAGNOSIS — J4489 Other specified chronic obstructive pulmonary disease: Secondary | ICD-10-CM | POA: Diagnosis not present

## 2013-07-31 DIAGNOSIS — M25519 Pain in unspecified shoulder: Secondary | ICD-10-CM | POA: Diagnosis present

## 2013-07-31 DIAGNOSIS — J189 Pneumonia, unspecified organism: Secondary | ICD-10-CM | POA: Diagnosis not present

## 2013-07-31 DIAGNOSIS — Z9889 Other specified postprocedural states: Secondary | ICD-10-CM | POA: Diagnosis not present

## 2013-07-31 DIAGNOSIS — F329 Major depressive disorder, single episode, unspecified: Secondary | ICD-10-CM | POA: Diagnosis present

## 2013-07-31 DIAGNOSIS — Z9981 Dependence on supplemental oxygen: Secondary | ICD-10-CM | POA: Diagnosis not present

## 2013-07-31 DIAGNOSIS — R109 Unspecified abdominal pain: Secondary | ICD-10-CM | POA: Diagnosis not present

## 2013-07-31 DIAGNOSIS — J9610 Chronic respiratory failure, unspecified whether with hypoxia or hypercapnia: Secondary | ICD-10-CM | POA: Diagnosis present

## 2013-07-31 DIAGNOSIS — M533 Sacrococcygeal disorders, not elsewhere classified: Secondary | ICD-10-CM | POA: Diagnosis not present

## 2013-07-31 DIAGNOSIS — R5381 Other malaise: Secondary | ICD-10-CM | POA: Diagnosis not present

## 2013-07-31 DIAGNOSIS — R059 Cough, unspecified: Secondary | ICD-10-CM | POA: Diagnosis not present

## 2013-07-31 DIAGNOSIS — Z6833 Body mass index (BMI) 33.0-33.9, adult: Secondary | ICD-10-CM | POA: Diagnosis not present

## 2013-07-31 LAB — COMPREHENSIVE METABOLIC PANEL
AST: 21 U/L (ref 15–37)
Albumin: 3.5 g/dL (ref 3.4–5.0)
Alkaline Phosphatase: 121 U/L — ABNORMAL HIGH
Anion Gap: 3 — ABNORMAL LOW (ref 7–16)
BUN: 14 mg/dL (ref 7–18)
Bilirubin,Total: 0.2 mg/dL (ref 0.2–1.0)
CALCIUM: 9.3 mg/dL (ref 8.5–10.1)
Chloride: 98 mmol/L (ref 98–107)
Co2: 37 mmol/L — ABNORMAL HIGH (ref 21–32)
Creatinine: 0.87 mg/dL (ref 0.60–1.30)
Glucose: 112 mg/dL — ABNORMAL HIGH (ref 65–99)
OSMOLALITY: 277 (ref 275–301)
Potassium: 3.3 mmol/L — ABNORMAL LOW (ref 3.5–5.1)
SGPT (ALT): 28 U/L (ref 12–78)
Sodium: 138 mmol/L (ref 136–145)
Total Protein: 8.2 g/dL (ref 6.4–8.2)

## 2013-07-31 LAB — URINALYSIS, COMPLETE
BACTERIA: NONE SEEN
Bilirubin,UR: NEGATIVE
Blood: NEGATIVE
Glucose,UR: NEGATIVE mg/dL (ref 0–75)
Ketone: NEGATIVE
LEUKOCYTE ESTERASE: NEGATIVE
NITRITE: NEGATIVE
PROTEIN: NEGATIVE
Ph: 6 (ref 4.5–8.0)
RBC,UR: NONE SEEN /HPF (ref 0–5)
SPECIFIC GRAVITY: 1.018 (ref 1.003–1.030)

## 2013-07-31 LAB — CBC
HCT: 33.6 % — AB (ref 35.0–47.0)
HGB: 11.1 g/dL — ABNORMAL LOW (ref 12.0–16.0)
MCH: 28.5 pg (ref 26.0–34.0)
MCHC: 33 g/dL (ref 32.0–36.0)
MCV: 86 fL (ref 80–100)
Platelet: 322 10*3/uL (ref 150–440)
RBC: 3.9 10*6/uL (ref 3.80–5.20)
RDW: 15.6 % — ABNORMAL HIGH (ref 11.5–14.5)
WBC: 8.9 10*3/uL (ref 3.6–11.0)

## 2013-07-31 LAB — TROPONIN I

## 2013-08-01 DIAGNOSIS — E039 Hypothyroidism, unspecified: Secondary | ICD-10-CM | POA: Diagnosis not present

## 2013-08-01 DIAGNOSIS — I10 Essential (primary) hypertension: Secondary | ICD-10-CM | POA: Diagnosis not present

## 2013-08-01 DIAGNOSIS — R5381 Other malaise: Secondary | ICD-10-CM | POA: Diagnosis not present

## 2013-08-01 DIAGNOSIS — I509 Heart failure, unspecified: Secondary | ICD-10-CM | POA: Diagnosis not present

## 2013-08-01 DIAGNOSIS — J189 Pneumonia, unspecified organism: Secondary | ICD-10-CM | POA: Diagnosis not present

## 2013-08-01 DIAGNOSIS — R509 Fever, unspecified: Secondary | ICD-10-CM | POA: Diagnosis not present

## 2013-08-01 DIAGNOSIS — R109 Unspecified abdominal pain: Secondary | ICD-10-CM | POA: Diagnosis not present

## 2013-08-01 LAB — CBC WITH DIFFERENTIAL/PLATELET
BASOS PCT: 0.4 %
Basophil #: 0 10*3/uL (ref 0.0–0.1)
EOS ABS: 0.3 10*3/uL (ref 0.0–0.7)
EOS PCT: 3.9 %
HCT: 34.4 % — ABNORMAL LOW (ref 35.0–47.0)
HGB: 10.8 g/dL — ABNORMAL LOW (ref 12.0–16.0)
LYMPHS ABS: 1.7 10*3/uL (ref 1.0–3.6)
LYMPHS PCT: 20.8 %
MCH: 27.3 pg (ref 26.0–34.0)
MCHC: 31.5 g/dL — AB (ref 32.0–36.0)
MCV: 87 fL (ref 80–100)
Monocyte #: 0.8 x10 3/mm (ref 0.2–0.9)
Monocyte %: 10 %
NEUTROS PCT: 64.9 %
Neutrophil #: 5.4 10*3/uL (ref 1.4–6.5)
Platelet: 291 10*3/uL (ref 150–440)
RBC: 3.96 10*6/uL (ref 3.80–5.20)
RDW: 15.6 % — AB (ref 11.5–14.5)
WBC: 8.3 10*3/uL (ref 3.6–11.0)

## 2013-08-01 LAB — TSH: THYROID STIMULATING HORM: 1.45 u[IU]/mL

## 2013-08-02 DIAGNOSIS — R109 Unspecified abdominal pain: Secondary | ICD-10-CM | POA: Diagnosis not present

## 2013-08-02 DIAGNOSIS — I10 Essential (primary) hypertension: Secondary | ICD-10-CM | POA: Diagnosis not present

## 2013-08-02 DIAGNOSIS — E039 Hypothyroidism, unspecified: Secondary | ICD-10-CM | POA: Diagnosis not present

## 2013-08-02 DIAGNOSIS — J189 Pneumonia, unspecified organism: Secondary | ICD-10-CM | POA: Diagnosis not present

## 2013-08-02 DIAGNOSIS — I509 Heart failure, unspecified: Secondary | ICD-10-CM | POA: Diagnosis not present

## 2013-08-02 LAB — CBC WITH DIFFERENTIAL/PLATELET
BASOS ABS: 0.1 10*3/uL (ref 0.0–0.1)
BASOS PCT: 0.8 %
Eosinophil #: 1 10*3/uL — ABNORMAL HIGH (ref 0.0–0.7)
Eosinophil %: 13.8 %
HCT: 34.5 % — AB (ref 35.0–47.0)
HGB: 10.9 g/dL — AB (ref 12.0–16.0)
Lymphocyte #: 1.8 10*3/uL (ref 1.0–3.6)
Lymphocyte %: 23.5 %
MCH: 27.9 pg (ref 26.0–34.0)
MCHC: 31.7 g/dL — ABNORMAL LOW (ref 32.0–36.0)
MCV: 88 fL (ref 80–100)
MONO ABS: 0.9 x10 3/mm (ref 0.2–0.9)
Monocyte %: 12.1 %
Neutrophil #: 3.8 10*3/uL (ref 1.4–6.5)
Neutrophil %: 49.8 %
PLATELETS: 285 10*3/uL (ref 150–440)
RBC: 3.92 10*6/uL (ref 3.80–5.20)
RDW: 15.3 % — ABNORMAL HIGH (ref 11.5–14.5)
WBC: 7.5 10*3/uL (ref 3.6–11.0)

## 2013-08-02 LAB — BASIC METABOLIC PANEL
Anion Gap: 5 — ABNORMAL LOW (ref 7–16)
BUN: 11 mg/dL (ref 7–18)
CALCIUM: 9 mg/dL (ref 8.5–10.1)
CHLORIDE: 99 mmol/L (ref 98–107)
Co2: 33 mmol/L — ABNORMAL HIGH (ref 21–32)
Creatinine: 1.12 mg/dL (ref 0.60–1.30)
EGFR (African American): >60
GFR CALC NON AF AMER: 52 — AB
Glucose: 106 mg/dL — ABNORMAL HIGH (ref 65–99)
Osmolality: 274 (ref 275–301)
POTASSIUM: 3.7 mmol/L (ref 3.5–5.1)
Sodium: 137 mmol/L (ref 136–145)

## 2013-08-03 DIAGNOSIS — I10 Essential (primary) hypertension: Secondary | ICD-10-CM | POA: Diagnosis not present

## 2013-08-03 DIAGNOSIS — I509 Heart failure, unspecified: Secondary | ICD-10-CM | POA: Diagnosis not present

## 2013-08-03 DIAGNOSIS — J449 Chronic obstructive pulmonary disease, unspecified: Secondary | ICD-10-CM | POA: Diagnosis not present

## 2013-08-03 DIAGNOSIS — T8140XA Infection following a procedure, unspecified, initial encounter: Secondary | ICD-10-CM | POA: Diagnosis not present

## 2013-08-05 DIAGNOSIS — J189 Pneumonia, unspecified organism: Secondary | ICD-10-CM | POA: Diagnosis not present

## 2013-08-05 DIAGNOSIS — I509 Heart failure, unspecified: Secondary | ICD-10-CM | POA: Diagnosis not present

## 2013-08-05 DIAGNOSIS — J449 Chronic obstructive pulmonary disease, unspecified: Secondary | ICD-10-CM | POA: Diagnosis not present

## 2013-08-05 DIAGNOSIS — K5732 Diverticulitis of large intestine without perforation or abscess without bleeding: Secondary | ICD-10-CM | POA: Diagnosis not present

## 2013-08-05 LAB — CULTURE, BLOOD (SINGLE)

## 2013-08-06 DIAGNOSIS — I509 Heart failure, unspecified: Secondary | ICD-10-CM | POA: Diagnosis not present

## 2013-08-06 DIAGNOSIS — T8140XA Infection following a procedure, unspecified, initial encounter: Secondary | ICD-10-CM | POA: Diagnosis not present

## 2013-08-06 DIAGNOSIS — J449 Chronic obstructive pulmonary disease, unspecified: Secondary | ICD-10-CM | POA: Diagnosis not present

## 2013-08-06 DIAGNOSIS — I10 Essential (primary) hypertension: Secondary | ICD-10-CM | POA: Diagnosis not present

## 2013-08-10 DIAGNOSIS — J449 Chronic obstructive pulmonary disease, unspecified: Secondary | ICD-10-CM | POA: Diagnosis not present

## 2013-08-10 DIAGNOSIS — I10 Essential (primary) hypertension: Secondary | ICD-10-CM | POA: Diagnosis not present

## 2013-08-10 DIAGNOSIS — T8140XA Infection following a procedure, unspecified, initial encounter: Secondary | ICD-10-CM | POA: Diagnosis not present

## 2013-08-10 DIAGNOSIS — I509 Heart failure, unspecified: Secondary | ICD-10-CM | POA: Diagnosis not present

## 2013-08-13 DIAGNOSIS — J449 Chronic obstructive pulmonary disease, unspecified: Secondary | ICD-10-CM | POA: Diagnosis not present

## 2013-08-13 DIAGNOSIS — I10 Essential (primary) hypertension: Secondary | ICD-10-CM | POA: Diagnosis not present

## 2013-08-13 DIAGNOSIS — I509 Heart failure, unspecified: Secondary | ICD-10-CM | POA: Diagnosis not present

## 2013-08-13 DIAGNOSIS — T8140XA Infection following a procedure, unspecified, initial encounter: Secondary | ICD-10-CM | POA: Diagnosis not present

## 2013-08-18 ENCOUNTER — Ambulatory Visit (INDEPENDENT_AMBULATORY_CARE_PROVIDER_SITE_OTHER): Payer: Medicare Other | Admitting: Internal Medicine

## 2013-08-18 ENCOUNTER — Encounter: Payer: Self-pay | Admitting: Internal Medicine

## 2013-08-18 VITALS — BP 113/66 | HR 83 | Ht 62.0 in | Wt 185.0 lb

## 2013-08-18 DIAGNOSIS — Z95810 Presence of automatic (implantable) cardiac defibrillator: Secondary | ICD-10-CM

## 2013-08-18 DIAGNOSIS — I5022 Chronic systolic (congestive) heart failure: Secondary | ICD-10-CM | POA: Diagnosis not present

## 2013-08-18 DIAGNOSIS — I428 Other cardiomyopathies: Secondary | ICD-10-CM

## 2013-08-18 LAB — MDC_IDC_ENUM_SESS_TYPE_INCLINIC
Brady Statistic RV Percent Paced: 97 %
Date Time Interrogation Session: 20150715114732
HighPow Impedance: 54 Ohm
Implantable Pulse Generator Serial Number: 605521
Lead Channel Impedance Value: 425 Ohm
Lead Channel Impedance Value: 637.5 Ohm
Lead Channel Pacing Threshold Amplitude: 0.75 V
Lead Channel Pacing Threshold Pulse Width: 0.8 ms
Lead Channel Pacing Threshold Pulse Width: 0.8 ms
Lead Channel Setting Pacing Amplitude: 2.5 V
Lead Channel Setting Pacing Amplitude: 2.5 V
Lead Channel Setting Sensing Sensitivity: 0.5 mV
MDC IDC MSMT BATTERY REMAINING LONGEVITY: 32.4 mo
MDC IDC MSMT LEADCHNL LV PACING THRESHOLD AMPLITUDE: 1 V
MDC IDC MSMT LEADCHNL LV PACING THRESHOLD AMPLITUDE: 1 V
MDC IDC MSMT LEADCHNL LV PACING THRESHOLD PULSEWIDTH: 0.5 ms
MDC IDC MSMT LEADCHNL LV PACING THRESHOLD PULSEWIDTH: 0.5 ms
MDC IDC MSMT LEADCHNL RA PACING THRESHOLD AMPLITUDE: 1 V
MDC IDC MSMT LEADCHNL RA PACING THRESHOLD PULSEWIDTH: 0.5 ms
MDC IDC MSMT LEADCHNL RA SENSING INTR AMPL: 5 mV
MDC IDC MSMT LEADCHNL RV IMPEDANCE VALUE: 612.5 Ohm
MDC IDC MSMT LEADCHNL RV PACING THRESHOLD AMPLITUDE: 0.75 V
MDC IDC MSMT LEADCHNL RV SENSING INTR AMPL: 12 mV
MDC IDC SET LEADCHNL LV PACING PULSEWIDTH: 0.5 ms
MDC IDC SET LEADCHNL RA PACING AMPLITUDE: 2 V
MDC IDC SET LEADCHNL RV PACING PULSEWIDTH: 0.8 ms
MDC IDC STAT BRADY RA PERCENT PACED: 2.2 %
Zone Setting Detection Interval: 250 ms
Zone Setting Detection Interval: 300 ms

## 2013-08-18 MED ORDER — CARVEDILOL 25 MG PO TABS: 25.0000 mg | ORAL_TABLET | ORAL | Status: DC

## 2013-08-18 NOTE — Progress Notes (Signed)
Patient Care Team: Cyndi Bender, PA-C as PCP - General (Physician Assistant)   HPI  Stacy Grimes is a 63 y.o. 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   Stable on current O2 therapy.  She has had a rough few months with ruptured diverticulitis and post operative pneumonia. She remains quite short of breath.     Past Medical History  Diagnosis Date  . Non-ischemic cardiomyopathy   . Chronic systolic heart failure   . Hypertension   . COPD (chronic obstructive pulmonary disease)   . Deafness     left ear  . AAA (abdominal aortic aneurysm)   . Hypothyroidism   . Anxiety   . Depression   . GERD (gastroesophageal reflux disease)   . Biventricular ICD  [employer]   . Sleep apnea   . Automatic implantable cardioverter-defibrillator in situ     Past Surgical History  Procedure Laterality Date  . Cholecystectomy    . Bladder surgery    . Knee surgery      right  . Wrist surgery      right  . Middle ear surgery    . Cardiac catheterization    . Cardiac defibrillator placement  4 yrs ago    pacemaker  . Abdominal hysterectomy    . Esophagogastroduodenoscopy N/A 07/06/2012    Procedure: ESOPHAGOGASTRODUODENOSCOPY (EGD);  Surgeon: Lafayette Dragon, MD;  Location: [facility];  Service: Endoscopy;  Laterality: N/A;  . Savory dilation N/A 07/06/2012    Procedure: SAVORY DILATION;  Surgeon: Lafayette Dragon, MD;  Location: [facility] END[facility]COPY;  Service: Endoscopy;  Laterality: N/A;  . Colonoscopy    . Colonoscopy N/A 08/31/2012    Procedure: COLONOSCOPY;  Surgeon: Inda Castle, MD;  Location: [facility] ENDOSCOPY;  Service: Endoscopy;  Laterality: N/A;    Current Outpatient Prescriptions  Medication Sig Dispense Refill  . albuterol (PROVENTIL HFA) 108 (90 BASE) MCG/ACT inhaler Inhale 2 puffs into the lungs every 6 (six) hours as needed.       Marland Kitchen allopurinol (ZYLOPRIM) 300 MG tablet Take 450 mg by  mouth daily.      Marland Kitchen ALPRAZolam (XANAX) 0.25 MG tablet Take 0.25 mg by mouth at bedtime as needed for anxiety.       Marland Kitchen aspirin 81 MG EC tablet Take 81 mg by mouth daily.        . carvedilol (COREG) 25 MG tablet Take 25 mg by mouth as directed. Take 1/2 tablet in the morning and 1/2 tablet in the evening      . cyclobenzaprine (FLEXERIL) 10 MG tablet Take 10 mg by mouth 3 (three) times daily as needed for muscle spasms.       . digoxin (LANOXIN) 0.125 MG tablet Take 125 mcg by mouth every morning.       . docusate sodium (COLACE) 100 MG capsule One to three tablets daily      . furosemide (LASIX) 40 MG tablet TAKE ONE TABLET BY MOUTH TWICE DAILY  60 tablet  3  . gabapentin (NEURONTIN) 600 MG tablet Take 600 mg by mouth 2 (two) times daily.      Marland Kitchen HYDROcodone-acetaminophen (NORCO) 10-325 MG per tablet Take 1 tablet by mouth every 6 (six) hours as needed.      . indomethacin (INDOCIN) 50 MG capsule Take 50 mg by mouth 3 (three) times daily with meals.      Marland Kitchen ipratropium-albuterol (DUONEB)  0.5-2.5 (3) MG/3ML SOLN Take 3 mLs by nebulization every 4 (four) hours as needed (for wheezing).       Marland Kitchen levothyroxine (SYNTHROID, LEVOTHROID) 88 MCG tablet Take 88 mcg by mouth every morning.       Marland Kitchen losartan (COZAAR) 50 MG tablet Take 25 mg by mouth daily.      . Omega-3 Fatty Acids (FISH OIL) 1000 MG CAPS One capsule by mouth twice daily      . omeprazole (PRILOSEC) 20 MG capsule Take 20 mg by mouth every morning.       . pravastatin (PRAVACHOL) 20 MG tablet Take 20 mg by mouth daily.      . roflumilast (DALIRESP) 500 MCG TABS tablet Take 500 mcg by mouth daily.      Marland Kitchen tiotropium (SPIRIVA) 18 MCG inhalation capsule Place 18 mcg into inhaler and inhale daily.       Marland Kitchen tiZANidine (ZANAFLEX) 4 MG tablet 4 mg. One tablet twice daily as needed for back/ muscle pain      . venlafaxine (EFFEXOR-XR) 37.5 MG 24 hr capsule Take 75 mg by mouth daily.       . vitamin B-12 (CYANOCOBALAMIN) 1000 MCG tablet 1,000 mcg. One  tablet by mouth twice daily      . zolpidem (AMBIEN) 5 MG tablet Take 5 mg by mouth at bedtime as needed for sleep.       No current facility-administered medications for this visit.    No Known Allergies  Review of Systems negative except from HPI and PMH  Physical Exam BP 113/66  Pulse 83  Ht 5\' 2"  (1.575 m)  Wt 185 lb (83.915 kg)  BMI 33.83 kg/m2 Well developed and well nourished wearing oxygen HENT normal-largely edentulous E scleral and icterus clear Neck Supple JVP flat; carotids brisk and full Wheezes anteriorly and posteriorly bilaterally  *Regular rate and rhythm, no murmurs gallops or rub Soft with active bowel sounds right upper quadrant tenderness No clubbing cyanosis Trace Edema Alert and oriented, grossly normal motor and sensory function Marked limitations of shoulder activity bilaterally Skin Warm and Dry  ECG demonstrates P. synchronous pacing with left bundle branch block morphology Intervals 16/14/42 Pre CRT implant QRS duration 180 ms   Assessment and  Plan Nonischemic cardiomyopathy  Congestive heart failure-chronic-systolic  Sinus tachycardia versus atrial tachycardia  Implantable defibrillator-[employer]-CRT The patient's device was interrogated and the information was fully reviewed.  The device was reprogrammed to shorten AV delay with exertion  Oxygen-dependent COPD  SHOULDER LIMITATIONS BILATERALLY   I suggested that she followup with her primary cardiologist and pulmonologist who are in [place]. This should be done sooner rather than later.  I have reprogrammed her device so as to try to promote AV synchrony at higher heart rates.  We will also increase her a.m. carvedilol 12.5--25 to try continuing her heart rate responses

## 2013-08-18 NOTE — Patient Instructions (Addendum)
Your physician has recommended you make the following change in your medication:  1) Change the way you are taking your Carvedilol -- take 1 tablet (25 mg total) in the morning and take 1/2 tablet (12.5 mg total) in the evening.   Your physician recommends that you schedule a follow-up appointment in: 4 months with heart failure clinic.  Remote monitoring is used to monitor your Pacemaker of ICD from home. This monitoring reduces the number of office visits required to check your device to one time per year. It allows [place] to keep an eye on the functioning of your device to ensure it is working properly. You are scheduled for a device check from home on 11/17/13. You may send your transmission at any time that day. If you have a wireless device, the transmission will be sent automatically. After your physician reviews your transmission, you will receive a postcard with your next transmission date.  Your physician wants you to follow-up in: 1 year with Dr. Caryl Comes.  You will receive a reminder letter in the mail two months in advance. If you don't receive a letter, please call our office to schedule the follow-up appointment.

## 2013-08-19 DIAGNOSIS — I10 Essential (primary) hypertension: Secondary | ICD-10-CM | POA: Diagnosis not present

## 2013-08-19 DIAGNOSIS — T8140XA Infection following a procedure, unspecified, initial encounter: Secondary | ICD-10-CM | POA: Diagnosis not present

## 2013-08-19 DIAGNOSIS — J449 Chronic obstructive pulmonary disease, unspecified: Secondary | ICD-10-CM | POA: Diagnosis not present

## 2013-08-19 DIAGNOSIS — I509 Heart failure, unspecified: Secondary | ICD-10-CM | POA: Diagnosis not present

## 2013-08-19 DIAGNOSIS — J441 Chronic obstructive pulmonary disease with (acute) exacerbation: Secondary | ICD-10-CM | POA: Diagnosis not present

## 2013-08-24 DIAGNOSIS — K219 Gastro-esophageal reflux disease without esophagitis: Secondary | ICD-10-CM | POA: Diagnosis not present

## 2013-08-24 DIAGNOSIS — I10 Essential (primary) hypertension: Secondary | ICD-10-CM | POA: Diagnosis not present

## 2013-08-24 DIAGNOSIS — I428 Other cardiomyopathies: Secondary | ICD-10-CM | POA: Diagnosis not present

## 2013-08-24 DIAGNOSIS — E785 Hyperlipidemia, unspecified: Secondary | ICD-10-CM | POA: Diagnosis not present

## 2013-08-26 DIAGNOSIS — I10 Essential (primary) hypertension: Secondary | ICD-10-CM | POA: Diagnosis not present

## 2013-08-26 DIAGNOSIS — T8140XA Infection following a procedure, unspecified, initial encounter: Secondary | ICD-10-CM | POA: Diagnosis not present

## 2013-08-26 DIAGNOSIS — G4710 Hypersomnia, unspecified: Secondary | ICD-10-CM | POA: Diagnosis not present

## 2013-08-26 DIAGNOSIS — J449 Chronic obstructive pulmonary disease, unspecified: Secondary | ICD-10-CM | POA: Diagnosis not present

## 2013-08-26 DIAGNOSIS — R0602 Shortness of breath: Secondary | ICD-10-CM | POA: Diagnosis not present

## 2013-08-26 DIAGNOSIS — J438 Other emphysema: Secondary | ICD-10-CM | POA: Diagnosis not present

## 2013-08-26 DIAGNOSIS — I509 Heart failure, unspecified: Secondary | ICD-10-CM | POA: Diagnosis not present

## 2013-08-26 DIAGNOSIS — G4730 Sleep apnea, unspecified: Secondary | ICD-10-CM | POA: Diagnosis not present

## 2013-09-01 DIAGNOSIS — T8140XA Infection following a procedure, unspecified, initial encounter: Secondary | ICD-10-CM | POA: Diagnosis not present

## 2013-09-01 DIAGNOSIS — I509 Heart failure, unspecified: Secondary | ICD-10-CM | POA: Diagnosis not present

## 2013-09-01 DIAGNOSIS — J449 Chronic obstructive pulmonary disease, unspecified: Secondary | ICD-10-CM | POA: Diagnosis not present

## 2013-09-01 DIAGNOSIS — I10 Essential (primary) hypertension: Secondary | ICD-10-CM | POA: Diagnosis not present

## 2013-09-06 ENCOUNTER — Encounter: Payer: Self-pay | Admitting: Internal Medicine

## 2013-09-06 DIAGNOSIS — C50919 Malignant neoplasm of unspecified site of unspecified female breast: Secondary | ICD-10-CM | POA: Diagnosis not present

## 2013-09-06 DIAGNOSIS — J449 Chronic obstructive pulmonary disease, unspecified: Secondary | ICD-10-CM | POA: Diagnosis not present

## 2013-09-06 DIAGNOSIS — M109 Gout, unspecified: Secondary | ICD-10-CM | POA: Diagnosis not present

## 2013-09-06 DIAGNOSIS — E039 Hypothyroidism, unspecified: Secondary | ICD-10-CM | POA: Diagnosis not present

## 2013-09-06 DIAGNOSIS — I509 Heart failure, unspecified: Secondary | ICD-10-CM | POA: Diagnosis not present

## 2013-09-06 DIAGNOSIS — E7800 Pure hypercholesterolemia, unspecified: Secondary | ICD-10-CM | POA: Diagnosis not present

## 2013-09-06 DIAGNOSIS — F341 Dysthymic disorder: Secondary | ICD-10-CM | POA: Diagnosis not present

## 2013-09-06 DIAGNOSIS — M545 Low back pain, unspecified: Secondary | ICD-10-CM | POA: Diagnosis not present

## 2013-09-06 DIAGNOSIS — M542 Cervicalgia: Secondary | ICD-10-CM | POA: Diagnosis not present

## 2013-09-15 ENCOUNTER — Other Ambulatory Visit: Payer: Self-pay | Admitting: Surgery

## 2013-09-15 DIAGNOSIS — R109 Unspecified abdominal pain: Secondary | ICD-10-CM | POA: Diagnosis not present

## 2013-09-15 LAB — COMPREHENSIVE METABOLIC PANEL
ALK PHOS: 117 U/L — AB
ANION GAP: 6 — AB (ref 7–16)
Albumin: 3.3 g/dL — ABNORMAL LOW (ref 3.4–5.0)
BILIRUBIN TOTAL: 0.2 mg/dL (ref 0.2–1.0)
BUN: 13 mg/dL (ref 7–18)
CALCIUM: 9.5 mg/dL (ref 8.5–10.1)
CHLORIDE: 97 mmol/L — AB (ref 98–107)
CREATININE: 1.02 mg/dL (ref 0.60–1.30)
Co2: 36 mmol/L — ABNORMAL HIGH (ref 21–32)
EGFR (African American): >60
GFR CALC NON AF AMER: 58 — AB
GLUCOSE: 117 mg/dL — AB (ref 65–99)
OSMOLALITY: 279 (ref 275–301)
Potassium: 3.4 mmol/L — ABNORMAL LOW (ref 3.5–5.1)
SGOT(AST): 20 U/L (ref 15–37)
SGPT (ALT): 31 U/L
Sodium: 139 mmol/L (ref 136–145)
Total Protein: 8.3 g/dL — ABNORMAL HIGH (ref 6.4–8.2)

## 2013-09-15 LAB — CBC WITH DIFFERENTIAL/PLATELET
Basophil #: 0 10*3/uL (ref 0.0–0.1)
Basophil %: 0.5 %
Eosinophil #: 0.3 10*3/uL (ref 0.0–0.7)
Eosinophil %: 4.1 %
HCT: 34.1 % — ABNORMAL LOW (ref 35.0–47.0)
HGB: 11.1 g/dL — AB (ref 12.0–16.0)
Lymphocyte #: 2.6 10*3/uL (ref 1.0–3.6)
Lymphocyte %: 32 %
MCH: 28.4 pg (ref 26.0–34.0)
MCHC: 32.5 g/dL (ref 32.0–36.0)
MCV: 87 fL (ref 80–100)
MONO ABS: 0.8 x10 3/mm (ref 0.2–0.9)
MONOS PCT: 10.3 %
NEUTROS ABS: 4.3 10*3/uL (ref 1.4–6.5)
Neutrophil %: 53.1 %
Platelet: 388 10*3/uL (ref 150–440)
RBC: 3.9 10*6/uL (ref 3.80–5.20)
RDW: 15.4 % — ABNORMAL HIGH (ref 11.5–14.5)
WBC: 8.2 10*3/uL (ref 3.6–11.0)

## 2013-09-21 DIAGNOSIS — H2510 Age-related nuclear cataract, unspecified eye: Secondary | ICD-10-CM | POA: Diagnosis not present

## 2013-09-22 ENCOUNTER — Ambulatory Visit: Payer: Self-pay | Admitting: Surgery

## 2013-09-22 DIAGNOSIS — Z888 Allergy status to other drugs, medicaments and biological substances status: Secondary | ICD-10-CM | POA: Diagnosis not present

## 2013-09-22 DIAGNOSIS — R109 Unspecified abdominal pain: Secondary | ICD-10-CM | POA: Diagnosis not present

## 2013-09-22 DIAGNOSIS — IMO0002 Reserved for concepts with insufficient information to code with codable children: Secondary | ICD-10-CM | POA: Diagnosis not present

## 2013-09-22 DIAGNOSIS — M47817 Spondylosis without myelopathy or radiculopathy, lumbosacral region: Secondary | ICD-10-CM | POA: Diagnosis not present

## 2013-09-22 DIAGNOSIS — K7689 Other specified diseases of liver: Secondary | ICD-10-CM | POA: Diagnosis not present

## 2013-10-03 ENCOUNTER — Other Ambulatory Visit: Payer: Self-pay | Admitting: Internal Medicine

## 2013-10-07 ENCOUNTER — Ambulatory Visit: Payer: Self-pay | Admitting: Surgery

## 2013-10-07 DIAGNOSIS — K5732 Diverticulitis of large intestine without perforation or abscess without bleeding: Secondary | ICD-10-CM | POA: Diagnosis not present

## 2013-10-07 DIAGNOSIS — Z01812 Encounter for preprocedural laboratory examination: Secondary | ICD-10-CM | POA: Diagnosis not present

## 2013-10-07 DIAGNOSIS — Z01810 Encounter for preprocedural cardiovascular examination: Secondary | ICD-10-CM | POA: Diagnosis not present

## 2013-10-07 DIAGNOSIS — I509 Heart failure, unspecified: Secondary | ICD-10-CM | POA: Diagnosis not present

## 2013-10-07 DIAGNOSIS — I10 Essential (primary) hypertension: Secondary | ICD-10-CM | POA: Diagnosis not present

## 2013-10-07 LAB — CBC WITH DIFFERENTIAL/PLATELET
BASOS ABS: 0.1 10*3/uL (ref 0.0–0.1)
Basophil %: 0.7 %
EOS ABS: 0.2 10*3/uL (ref 0.0–0.7)
Eosinophil %: 2.5 %
HCT: 36.2 % (ref 35.0–47.0)
HGB: 11.3 g/dL — AB (ref 12.0–16.0)
LYMPHS ABS: 1.9 10*3/uL (ref 1.0–3.6)
LYMPHS PCT: 21.3 %
MCH: 27.4 pg (ref 26.0–34.0)
MCHC: 31.2 g/dL — ABNORMAL LOW (ref 32.0–36.0)
MCV: 88 fL (ref 80–100)
MONO ABS: 0.8 x10 3/mm (ref 0.2–0.9)
MONOS PCT: 9.5 %
NEUTROS ABS: 5.8 10*3/uL (ref 1.4–6.5)
Neutrophil %: 66 %
Platelet: 339 10*3/uL (ref 150–440)
RBC: 4.13 10*6/uL (ref 3.80–5.20)
RDW: 15.7 % — ABNORMAL HIGH (ref 11.5–14.5)
WBC: 8.8 10*3/uL (ref 3.6–11.0)

## 2013-10-07 LAB — BASIC METABOLIC PANEL
ANION GAP: 9 (ref 7–16)
BUN: 9 mg/dL (ref 7–18)
CALCIUM: 9.3 mg/dL (ref 8.5–10.1)
Chloride: 97 mmol/L — ABNORMAL LOW (ref 98–107)
Co2: 36 mmol/L — ABNORMAL HIGH (ref 21–32)
Creatinine: 1.04 mg/dL (ref 0.60–1.30)
EGFR (African American): >60
GFR CALC NON AF AMER: 57 — AB
GLUCOSE: 150 mg/dL — AB (ref 65–99)
OSMOLALITY: 285 (ref 275–301)
POTASSIUM: 3.3 mmol/L — AB (ref 3.5–5.1)
Sodium: 142 mmol/L (ref 136–145)

## 2013-10-13 ENCOUNTER — Ambulatory Visit: Payer: Self-pay | Admitting: Surgery

## 2013-10-13 DIAGNOSIS — M79609 Pain in unspecified limb: Secondary | ICD-10-CM | POA: Diagnosis not present

## 2013-10-13 DIAGNOSIS — R109 Unspecified abdominal pain: Secondary | ICD-10-CM | POA: Diagnosis not present

## 2013-10-13 DIAGNOSIS — Z9049 Acquired absence of other specified parts of digestive tract: Secondary | ICD-10-CM | POA: Diagnosis not present

## 2013-10-13 DIAGNOSIS — M549 Dorsalgia, unspecified: Secondary | ICD-10-CM | POA: Diagnosis not present

## 2013-10-13 DIAGNOSIS — Z7982 Long term (current) use of aspirin: Secondary | ICD-10-CM | POA: Diagnosis not present

## 2013-10-13 DIAGNOSIS — F3289 Other specified depressive episodes: Secondary | ICD-10-CM | POA: Diagnosis not present

## 2013-10-13 DIAGNOSIS — I10 Essential (primary) hypertension: Secondary | ICD-10-CM | POA: Diagnosis not present

## 2013-10-13 DIAGNOSIS — I2510 Atherosclerotic heart disease of native coronary artery without angina pectoris: Secondary | ICD-10-CM | POA: Diagnosis not present

## 2013-10-13 DIAGNOSIS — R1084 Generalized abdominal pain: Secondary | ICD-10-CM | POA: Diagnosis not present

## 2013-10-13 DIAGNOSIS — Z95810 Presence of automatic (implantable) cardiac defibrillator: Secondary | ICD-10-CM | POA: Diagnosis not present

## 2013-10-13 DIAGNOSIS — F410 Panic disorder [episodic paroxysmal anxiety] without agoraphobia: Secondary | ICD-10-CM | POA: Diagnosis not present

## 2013-10-13 DIAGNOSIS — K5732 Diverticulitis of large intestine without perforation or abscess without bleeding: Secondary | ICD-10-CM | POA: Diagnosis not present

## 2013-10-13 DIAGNOSIS — K219 Gastro-esophageal reflux disease without esophagitis: Secondary | ICD-10-CM | POA: Diagnosis not present

## 2013-10-13 DIAGNOSIS — G8929 Other chronic pain: Secondary | ICD-10-CM | POA: Diagnosis not present

## 2013-10-13 DIAGNOSIS — J449 Chronic obstructive pulmonary disease, unspecified: Secondary | ICD-10-CM | POA: Diagnosis not present

## 2013-10-13 LAB — POTASSIUM: Potassium: 3 mmol/L — ABNORMAL LOW (ref 3.5–5.1)

## 2013-11-17 ENCOUNTER — Telehealth: Payer: Self-pay | Admitting: Cardiology

## 2013-11-17 ENCOUNTER — Encounter: Payer: Medicare Other | Admitting: *Deleted

## 2013-11-17 NOTE — Telephone Encounter (Signed)
LMOVM reminding pt to send remote transmission.   

## 2013-11-19 ENCOUNTER — Encounter: Payer: Self-pay | Admitting: Cardiology

## 2013-11-26 ENCOUNTER — Ambulatory Visit (INDEPENDENT_AMBULATORY_CARE_PROVIDER_SITE_OTHER): Payer: Medicare Other | Admitting: *Deleted

## 2013-11-26 ENCOUNTER — Encounter: Payer: Self-pay | Admitting: Internal Medicine

## 2013-11-26 DIAGNOSIS — I428 Other cardiomyopathies: Secondary | ICD-10-CM

## 2013-11-26 DIAGNOSIS — I429 Cardiomyopathy, unspecified: Secondary | ICD-10-CM | POA: Diagnosis not present

## 2013-11-26 NOTE — Progress Notes (Signed)
Remote ICD transmission.   

## 2013-11-30 DIAGNOSIS — Z79899 Other long term (current) drug therapy: Secondary | ICD-10-CM | POA: Diagnosis not present

## 2013-11-30 DIAGNOSIS — J441 Chronic obstructive pulmonary disease with (acute) exacerbation: Secondary | ICD-10-CM | POA: Diagnosis not present

## 2013-11-30 DIAGNOSIS — Z0000 Encounter for general adult medical examination without abnormal findings: Secondary | ICD-10-CM | POA: Diagnosis not present

## 2013-11-30 DIAGNOSIS — F418 Other specified anxiety disorders: Secondary | ICD-10-CM | POA: Diagnosis not present

## 2013-11-30 DIAGNOSIS — Z23 Encounter for immunization: Secondary | ICD-10-CM | POA: Diagnosis not present

## 2013-11-30 DIAGNOSIS — G2581 Restless legs syndrome: Secondary | ICD-10-CM | POA: Diagnosis not present

## 2013-11-30 DIAGNOSIS — K219 Gastro-esophageal reflux disease without esophagitis: Secondary | ICD-10-CM | POA: Diagnosis not present

## 2013-11-30 DIAGNOSIS — I509 Heart failure, unspecified: Secondary | ICD-10-CM | POA: Diagnosis not present

## 2013-11-30 LAB — MDC_IDC_ENUM_SESS_TYPE_REMOTE
Battery Remaining Percentage: 39 %
Battery Voltage: 2.89 V
Brady Statistic AP VS Percent: 1 %
Date Time Interrogation Session: 20151023111435
HighPow Impedance: 53 Ohm
Implantable Pulse Generator Serial Number: 605521
Lead Channel Impedance Value: 360 Ohm
Lead Channel Impedance Value: 640 Ohm
Lead Channel Pacing Threshold Amplitude: 0.75 V
Lead Channel Pacing Threshold Amplitude: 1.25 V
Lead Channel Pacing Threshold Pulse Width: 0.5 ms
Lead Channel Sensing Intrinsic Amplitude: 12 mV
Lead Channel Sensing Intrinsic Amplitude: 5 mV
Lead Channel Setting Pacing Amplitude: 2.5 V
Lead Channel Setting Pacing Amplitude: 2.5 V
Lead Channel Setting Pacing Pulse Width: 0.5 ms
Lead Channel Setting Pacing Pulse Width: 0.8 ms
Lead Channel Setting Sensing Sensitivity: 0.5 mV
MDC IDC MSMT BATTERY REMAINING LONGEVITY: 30 mo
MDC IDC MSMT LEADCHNL LV PACING THRESHOLD AMPLITUDE: 1 V
MDC IDC MSMT LEADCHNL LV PACING THRESHOLD PULSEWIDTH: 0.5 ms
MDC IDC MSMT LEADCHNL RV IMPEDANCE VALUE: 640 Ohm
MDC IDC MSMT LEADCHNL RV PACING THRESHOLD PULSEWIDTH: 0.8 ms
MDC IDC SET LEADCHNL RA PACING AMPLITUDE: 2.25 V
MDC IDC SET ZONE DETECTION INTERVAL: 300 ms
MDC IDC STAT BRADY AP VP PERCENT: 3.3 %
MDC IDC STAT BRADY AS VP PERCENT: 96 %
MDC IDC STAT BRADY AS VS PERCENT: 1.1 %
MDC IDC STAT BRADY RA PERCENT PACED: 3.2 %
Zone Setting Detection Interval: 250 ms

## 2013-12-08 DIAGNOSIS — Z803 Family history of malignant neoplasm of breast: Secondary | ICD-10-CM | POA: Diagnosis not present

## 2013-12-08 DIAGNOSIS — Z1231 Encounter for screening mammogram for malignant neoplasm of breast: Secondary | ICD-10-CM | POA: Diagnosis not present

## 2013-12-21 DIAGNOSIS — J441 Chronic obstructive pulmonary disease with (acute) exacerbation: Secondary | ICD-10-CM | POA: Diagnosis not present

## 2013-12-24 ENCOUNTER — Encounter: Payer: Self-pay | Admitting: Cardiology

## 2014-02-08 DIAGNOSIS — I509 Heart failure, unspecified: Secondary | ICD-10-CM | POA: Diagnosis not present

## 2014-02-08 DIAGNOSIS — G4733 Obstructive sleep apnea (adult) (pediatric): Secondary | ICD-10-CM | POA: Diagnosis not present

## 2014-02-08 DIAGNOSIS — I340 Nonrheumatic mitral (valve) insufficiency: Secondary | ICD-10-CM | POA: Diagnosis not present

## 2014-02-08 DIAGNOSIS — I2510 Atherosclerotic heart disease of native coronary artery without angina pectoris: Secondary | ICD-10-CM | POA: Diagnosis not present

## 2014-02-08 DIAGNOSIS — J449 Chronic obstructive pulmonary disease, unspecified: Secondary | ICD-10-CM | POA: Diagnosis not present

## 2014-02-08 DIAGNOSIS — I10 Essential (primary) hypertension: Secondary | ICD-10-CM | POA: Diagnosis not present

## 2014-02-08 DIAGNOSIS — I420 Dilated cardiomyopathy: Secondary | ICD-10-CM | POA: Diagnosis not present

## 2014-02-08 DIAGNOSIS — R079 Chest pain, unspecified: Secondary | ICD-10-CM | POA: Diagnosis not present

## 2014-02-08 DIAGNOSIS — J9611 Chronic respiratory failure with hypoxia: Secondary | ICD-10-CM | POA: Diagnosis not present

## 2014-02-10 DIAGNOSIS — R079 Chest pain, unspecified: Secondary | ICD-10-CM | POA: Diagnosis not present

## 2014-02-18 DIAGNOSIS — E785 Hyperlipidemia, unspecified: Secondary | ICD-10-CM | POA: Diagnosis not present

## 2014-02-18 DIAGNOSIS — I420 Dilated cardiomyopathy: Secondary | ICD-10-CM | POA: Diagnosis not present

## 2014-02-18 DIAGNOSIS — I10 Essential (primary) hypertension: Secondary | ICD-10-CM | POA: Diagnosis not present

## 2014-02-18 DIAGNOSIS — I509 Heart failure, unspecified: Secondary | ICD-10-CM | POA: Diagnosis not present

## 2014-02-18 DIAGNOSIS — I340 Nonrheumatic mitral (valve) insufficiency: Secondary | ICD-10-CM | POA: Diagnosis not present

## 2014-02-28 ENCOUNTER — Encounter: Payer: Self-pay | Admitting: Internal Medicine

## 2014-02-28 ENCOUNTER — Ambulatory Visit (INDEPENDENT_AMBULATORY_CARE_PROVIDER_SITE_OTHER): Payer: Medicare Other | Admitting: *Deleted

## 2014-02-28 DIAGNOSIS — I429 Cardiomyopathy, unspecified: Secondary | ICD-10-CM

## 2014-02-28 DIAGNOSIS — I428 Other cardiomyopathies: Secondary | ICD-10-CM

## 2014-02-28 NOTE — Progress Notes (Signed)
Remote ICD transmission.   

## 2014-03-03 LAB — MDC_IDC_ENUM_SESS_TYPE_REMOTE
Battery Remaining Longevity: 29 mo
Battery Remaining Percentage: 36 %
Brady Statistic AP VP Percent: 7.3 %
Brady Statistic AP VS Percent: 1 %
Brady Statistic AS VP Percent: 92 %
Brady Statistic AS VS Percent: 1 %
Brady Statistic RA Percent Paced: 7.2 %
HIGH POWER IMPEDANCE MEASURED VALUE: 50 Ohm
Implantable Pulse Generator Serial Number: 605521
Lead Channel Impedance Value: 350 Ohm
Lead Channel Impedance Value: 710 Ohm
Lead Channel Pacing Threshold Pulse Width: 0.5 ms
Lead Channel Setting Pacing Amplitude: 2 V
Lead Channel Setting Pacing Amplitude: 2.5 V
Lead Channel Setting Pacing Amplitude: 2.5 V
Lead Channel Setting Sensing Sensitivity: 0.5 mV
MDC IDC MSMT BATTERY VOLTAGE: 2.89 V
MDC IDC MSMT LEADCHNL LV IMPEDANCE VALUE: 640 Ohm
MDC IDC MSMT LEADCHNL RA PACING THRESHOLD AMPLITUDE: 1 V
MDC IDC MSMT LEADCHNL RA SENSING INTR AMPL: 5 mV
MDC IDC MSMT LEADCHNL RV SENSING INTR AMPL: 12 mV
MDC IDC SESS DTM: 20160125070009
MDC IDC SET LEADCHNL LV PACING PULSEWIDTH: 0.5 ms
MDC IDC SET LEADCHNL RV PACING PULSEWIDTH: 0.8 ms
MDC IDC SET ZONE DETECTION INTERVAL: 300 ms
Zone Setting Detection Interval: 250 ms

## 2014-03-14 ENCOUNTER — Encounter: Payer: Self-pay | Admitting: Cardiology

## 2014-03-14 DIAGNOSIS — J9611 Chronic respiratory failure with hypoxia: Secondary | ICD-10-CM | POA: Diagnosis not present

## 2014-03-14 DIAGNOSIS — J439 Emphysema, unspecified: Secondary | ICD-10-CM | POA: Diagnosis not present

## 2014-03-14 DIAGNOSIS — G4730 Sleep apnea, unspecified: Secondary | ICD-10-CM | POA: Diagnosis not present

## 2014-03-14 DIAGNOSIS — J449 Chronic obstructive pulmonary disease, unspecified: Secondary | ICD-10-CM | POA: Diagnosis not present

## 2014-03-18 DIAGNOSIS — K219 Gastro-esophageal reflux disease without esophagitis: Secondary | ICD-10-CM | POA: Diagnosis not present

## 2014-03-18 DIAGNOSIS — J441 Chronic obstructive pulmonary disease with (acute) exacerbation: Secondary | ICD-10-CM | POA: Diagnosis not present

## 2014-03-18 DIAGNOSIS — E039 Hypothyroidism, unspecified: Secondary | ICD-10-CM | POA: Diagnosis not present

## 2014-03-18 DIAGNOSIS — M545 Low back pain: Secondary | ICD-10-CM | POA: Diagnosis not present

## 2014-04-10 ENCOUNTER — Emergency Department: Payer: Self-pay | Admitting: Emergency Medicine

## 2014-04-10 DIAGNOSIS — M5431 Sciatica, right side: Secondary | ICD-10-CM | POA: Diagnosis not present

## 2014-04-10 DIAGNOSIS — M5432 Sciatica, left side: Secondary | ICD-10-CM | POA: Diagnosis not present

## 2014-04-10 DIAGNOSIS — G8929 Other chronic pain: Secondary | ICD-10-CM | POA: Diagnosis not present

## 2014-04-10 DIAGNOSIS — I10 Essential (primary) hypertension: Secondary | ICD-10-CM | POA: Diagnosis not present

## 2014-04-11 DIAGNOSIS — M545 Low back pain: Secondary | ICD-10-CM | POA: Diagnosis not present

## 2014-05-20 DIAGNOSIS — Z9181 History of falling: Secondary | ICD-10-CM | POA: Diagnosis not present

## 2014-05-20 DIAGNOSIS — E780 Pure hypercholesterolemia: Secondary | ICD-10-CM | POA: Diagnosis not present

## 2014-05-20 DIAGNOSIS — M545 Low back pain: Secondary | ICD-10-CM | POA: Diagnosis not present

## 2014-05-20 DIAGNOSIS — Z1389 Encounter for screening for other disorder: Secondary | ICD-10-CM | POA: Diagnosis not present

## 2014-05-20 DIAGNOSIS — I10 Essential (primary) hypertension: Secondary | ICD-10-CM | POA: Diagnosis not present

## 2014-05-20 DIAGNOSIS — M109 Gout, unspecified: Secondary | ICD-10-CM | POA: Diagnosis not present

## 2014-05-20 DIAGNOSIS — E039 Hypothyroidism, unspecified: Secondary | ICD-10-CM | POA: Diagnosis not present

## 2014-05-20 DIAGNOSIS — I509 Heart failure, unspecified: Secondary | ICD-10-CM | POA: Diagnosis not present

## 2014-05-20 DIAGNOSIS — Z79899 Other long term (current) drug therapy: Secondary | ICD-10-CM | POA: Diagnosis not present

## 2014-05-20 DIAGNOSIS — F418 Other specified anxiety disorders: Secondary | ICD-10-CM | POA: Diagnosis not present

## 2014-05-28 NOTE — Discharge Summary (Signed)
PATIENT NAME:  Stacy Grimes, Stacy Grimes MR#:  [id number] DATE OF BIRTH:  1950-05-25  DATE OF ADMISSION:  03/06/2013 DATE OF DISCHARGE:  03/10/2013  DIAGNOSES: Perforated diverticulitis with pelvic abscess, congestive heart failure, hypertension and chronic obstructive pulmonary disease.   PROCEDURES: Diagnostic laparoscopy and drainage of pelvic abscess.   HISTORY OF PRESENT ILLNESS: This is a patient who was admitted to the hospital with right lower quadrant pain and tenderness that had been going on for about 2 weeks. She was tender on exam but more in the lower quadrant than in the [place] point area, and a CT scan suggested a pelvic abscess, possible ruptured appendix. She was taken to the operating room and somewhat skeptical of the idea that this was appendicitis; however, we needed to be sure of the diagnoses and at laparoscopy, we defined a pelvic abscess, likely resulting from perforated diverticulitis. The appendix was not visualized, but was in no way associated with this abscess deep in the true pelvis. The cecum was not inflamed. The appendix was believed to be more retrocecal, but was not dissected out once it was clear that it was not involved in this pelvic abscess.   The patient was treated with IV antibiotics and then switched to oral antibiotics and a regular diet to be discharged in stable condition on oral antibiotics and oral analgesics, to follow up in my office in 10 days.    ____________________________ Jerrol Banana Burt Knack, MD rec:dmm D: 03/15/2013 13:01:00 ET T: 03/15/2013 19:54:22 ET JOB#: [id number]  cc: Jerrol Banana. Burt Knack, MD, <Dictator> Florene Glen MD ELECTRONICALLY SIGNED 03/16/2013 8:18

## 2014-05-28 NOTE — Consult Note (Signed)
PATIENT NAME:  ROANNA, REAVES MR#:  [id number] DATE OF BIRTH:  22-Feb-1950  DATE OF CONSULTATION:  07/31/2013  CONSULTING PHYSICIAN:  Harrell Gave A. Lundquist, MD  REASON FOR CONSULTATION: Subjective fevers, chills, malaise for 3 days.   HISTORY OF PRESENT ILLNESS: Ms. Dekoning is a pleasant 64 year old female who I know quite well whom I have been following as an outpatient for a wound infection. She recently was discharged following a colon resection with low pelvic anastomosis for probable diverticulitis. I have been seeing her for a wound infection, which has been resolving. Throughout her time since her discharge on June 3rd, she has slowly improved from her main complaints, those being poor appetite, significant weight loss, generalized malaise and constipation. She returns today with 2 days of subjective fever and chills and persistent abdominal pain, as well as diarrhea. Otherwise no chest pain, shortness of breath, cough, is currently nauseated without vomiting. No dysuria or hematuria.   PAST MEDICAL HISTORY:  1. History of diverticulitis status post colon resection with low pelvic anastomosis.  2. History of chronic obstructive pulmonary disease with home oxygen dependence. 3. History of CHF.  4. History of hypertension.   ALLERGIES:  1. IV DYE.  2. OXYCODONE.  3. ADHESIVE TAPE.   HOME MEDICATIONS: Are as follows: 1. Aspirin 81 p.o. daily.  2. Losartan 25 mg p.o. daily. 3. L-thyroxine 88 mcg p.o. daily.  4. Digoxin 125 mcg p.o. daily.  5. Carvedilol 12.5 p.o. b.i.d. 6. Lasix p.o. b.i.d.  7. Effexor 75 p.o. b.i.d.  8. Norco 325/10 one p.o. q. 2-3 times a day p.r.n. back pain.  9. Allopurinol 450 p.o. daily.  10. Indomethacin 50 mg p.o. t.i.d.  11. Cyclobenzaprine 10 mg p.o. t.i.d. 12. Spiriva 18 mcg p.o. daily p.r.n. shortness of breath. 13. Ventolin inhaler 2 puffs q.i.d. p.r.n. shortness of breath and wheezing.  14. Daliresp 500 mcg p.o. daily. 15. Tizanidine 4 mg p.o.  b.i.d.  16. Gabapentin 600 t.i.d. for restless legs.  17. Ambien 5 p.o. at bedtime p.r.n.  18. Prilosec 20 mg p.o. daily.  19. Vitamin B12 p.o. daily.  20. Colace 100 mg p.o. daily.  21. MiraLax 1 packet p.o. daily.  22. Fish oil p.o. daily.  23. Xanax 0.25 p.o. daily p.r.n. anxiety.  24. DuoNeb q.i.d. p.r.n. shortness of breath.  25. Pravastatin 20 mg p.o. q. bedtime.   REVIEW OF SYSTEMS: noncontributory.   SOCIAL HISTORY: Does not drink. No current smoking.   REVIEW OF SYSTEMS: A 12-point review of systems was obtained. Pertinent positives and negatives as above.   PHYSICAL EXAMINATION:  VITAL SIGNS: Temperature 98, pulse 92, blood pressure 147/86, respiration 17, and 98% on 3 liters.  GENERAL: No acute distress. Alert and oriented x 3.  HEAD: Normocephalic, atraumatic.  EYES: No scleral icterus. No conjunctivitis.  FACE: no facial trauma, normal external ears and nose. CHEST: Lungs clear to auscultation. Moving air well.  HEART: Regular rate and rhythm. No murmurs, rubs, or gallops.  ABDOMEN: Soft, nontender, nondistended. Approximately 2 cm of the granulating tissue.  EXTREMITIES: Moves all extremities well. Strength 5/5.  NEUROLOGIC: Cranial nerves II-XII grossly intact.   LABORATORY AND RADIOLOGICAL DATA: Significant for a white cell count 8.9. Normal hemoglobin, hematocrit, platelets. Urinalysis negative. TMP relatively unremarkable.  X-ray shows a new left basilar opacity suspicious for pneumonia.   X-ray of sacrum and coccyx for which he reports pain, negative.    ASSESSMENT AND PLAN: Ms. Lapre is a pleasant 64 year old female with a history of  3 days of subjective fevers and chills as well as nausea. I do not see any obvious etiology related to a recent surgery for subjective symptoms. She is to undergo CT. We will follow up on CT scan likely if she is nauseated could be to ileus from probable pneumonia. No acute issues. Will continue to follow.     ____________________________ Glena Norfolk Lundquist, MD cal:lt D: 07/31/2013 20:54:09 ET T: 08/01/2013 04:59:29 ET JOB#: [id number]  cc: Harrell Gave A. Lundquist, MD, <Dictator> Floyde Parkins MD ELECTRONICALLY SIGNED 08/09/2013 11:49

## 2014-05-28 NOTE — Op Note (Signed)
PATIENT NAME:  Stacy Grimes, Stacy Grimes MR#:  [id number] DATE OF BIRTH:  06/07/1950  DATE OF PROCEDURE:  06/29/2013  PREOPERATIVE DIAGNOSIS: Diverticulosis.   POSTOPERATIVE DIAGNOSIS:   Diverticulosis.  PROCEDURE PERFORMED: Exploratory laparotomy, sigmoid colon resection with low anterior resection and anastomosis, extensive adhesiolysis.   SURGEON:  Azadeh Hyder E. Burt Knack, M.D.   ANESTHESIA:  General with endotracheal tube.   INDICATIONS:  This is a patient with a history of a prior pelvic abscess that originally was believed to be due to appendicitis, but laparoscopy failed to identify any inflammation in the area of the appendix and the appendix could not be identified, but there was a clearcut abscess in the right hemipelvis true pelvis, likely secondary to diverticulitis. She had a colonoscopy in the past demonstrating diverticulosis.   Preoperatively, we discussed the rationale for surgery, the options of observation, risk of bleeding, infection, recurrence, anastomosis, anastomotic leak, temporary or permanent colostomy or ileostomy and also the fact that she has had a poor bowel prep or a less than optimal bowel prep and that that increases her risk of anastomotic leak requiring additional surgery. This was all reviewed for she and her family. We also discussed removing the appendix. See below.   FINDINGS:  In spite of an extensive search, the appendix could not be identified. See below for details.   The sigmoid colon was quite redundant and scarified down into the true pelvis in an S loop with extensive scar suggesting prior diverticulitis.   DESCRIPTION OF PROCEDURE:  The patient was induced with general anesthesia, placed in the low lithotomy position, well-padded. IV antibiotics were given. VTE prophylaxis was in place, a Foley catheter was in place, and then she was prepped and draped in sterile fashion.   A midline incision was utilized to open and explore the abdominal cavity. There was no  active inflammation. The small bowel was run and there was no other pathology in the small bowel or palpable portions of the colon. The right colon was identified and elevated. Adhesions were taken down in this area. There was no sign of active inflammation, and the appendix could not be identified. The lateral peritoneal reflection was taken down to mobilize the right colon to look for a retrocecal appendix and multiple attempts were performed and the appendix could not be identified attached to the cecum.   Attention was turned to the true pelvis where scarified but not inflamed colon was found. Adhesions were taken down with electrocautery and sharp dissection and the redundant S-shaped sigmoid colon was elevated out of the true pelvis. The left ureter and right ureter were both identified and protected and kept in view during the dissection. The TA stapling device was utilized to fire across the distal colon at the rectosigmoid juncture. Dissection proximal was performed. The lateral peritoneal reflection was taken down. Again, the ureter was identified and kept in view at all times. The proximal section of the colon, which was not involved with diverticulosis, was divided with a GIA, and the mesentery was divided between clamps and ligated with 0 silk ligatures.   Hemostasis was adequate at this point. There was no sign of bleeding and the ureters were checked; therefore, at this point, the GIA staple line was excised from the proximal end of the colon and the colon was observed and found to be slightly ischemic in the area of proposed anastomosis of the proximal end of the colon. Therefore, a further resection was performed more proximal, good bleeding tissue was identified  and EEA sizers were utilized to identify the proper size.  A 29 EEA was chosen and the anvil was sewn in with 3-0 Prolene pursestring sutures.  Again, the pelvis was checked to be of active bleeding, and from below was placed the EEA  sizers after a digital rectal exam was performed, and then the EEA sizers were advanced to the staple line, and then the EEA stapling device was advanced to the staple line and attached to the anvil and fired in a standard fashion.   The EEA was withdrawn and donuts were inspected and found to be intact.   The pelvis was filled with saline irrigant and a Toomey syringe was utilized to insufflate the rectum. There was no sign of leak; therefore, the irrigant was aspirated. Hemostasis was checked and found to be adequate, and at this point, the sponge, lap and needle count was correct. The gown and gloves were changed per protocol, and attention was returned to the pelvis. Hemostasis was adequate again. The right lower quadrant was again inspected. Thorough inspection of the cecum was performed and the appendix could not be identified. There was no sign of prior appendicitis. There was minimal scar in this area to suggest the potential of her having it being removed in the past, but the patient had given no history of such.  The anastomosis appeared patent and viable. Therefore, the area was checked for hemostasis and found to be adequate. The wound was then closed after checking that the sponge, lap and needle count was correct. A piece of Seprafilm was placed, then the wound was closed with running #1 PDS. Marcaine was infiltrated in skin and subcutaneous tissues for a total of 30 mL, and then skin staples were placed.   The patient tolerated this procedure well. There were no complications. She was taken to the recovery room in stable condition to be admitted for continued care.    ESTIMATED BLOOD LOSS: 150.   Sponge, lap and needle counts correct.    ____________________________ Jerrol Banana. Burt Knack, MD rec:dmm D: 06/29/2013 14:54:43 ET T: 06/29/2013 15:36:23 ET JOB#: [id number]  cc: Jerrol Banana. Burt Knack, MD, <Dictator> Florene Glen MD ELECTRONICALLY SIGNED 06/29/2013 18:16

## 2014-05-28 NOTE — H&P (Signed)
Subjective/Chief Complaint lower abd pain   History of Present Illness two weeks abd pain, both lower quadrants no fevers, some chills no prior episode, nausea no vomiting constipated but had BM today, no blood no melena points to sp area as max tenderness   Past History PMH CHF, HTN, COPD PSH pacer/defib left chest GB rt wrist plate ears  ex tobacco abuser   Past Medical Health Coronary Artery Disease, Hypertension, COPD   Past Med/Surgical Hx:  Degenerative Joint Disease:   Hypothyroidism:   Depression:   Hypertension:   Chronic Back Pain:   Lumbar Back Fx:   ALLERGIES:  Codeine: GI Distress  IVP Dye: Hives  Family and Social History:  Family History Non-Contributory   Social History negative tobacco, negative ETOH, diosabled, ex smoker remote   Place of Living Home   Review of Systems:  Fever/Chills Yes   Cough No   Abdominal Pain Yes   Diarrhea No   Constipation Yes   Nausea/Vomiting Yes   SOB/DOE No   Chest Pain No   Dysuria No   Tolerating Diet Yes  Nauseated  ate this am   Medications/Allergies Reviewed Medications/Allergies reviewed   Physical Exam:  GEN uncomfortable   HEENT pink conjunctivae   NECK supple   RESP normal resp effort  clear BS  left chest   CARD regular rate   ABD positive tenderness  soft  obese, guarding both LQ, perc tenderness rlq max at and below McB pt   LYMPH negative neck   EXTR negative edema   SKIN normal to palpation   PSYCH alert, A+O to time, place, person, poor insight, described condition multiple times, no family present   Additional Comments had colonoscopy 5 mos ago   Lab Results: Hepatic:  31-Jan-15 12:09   Bilirubin, Total 0.3  Alkaline Phosphatase 98 (45-117 NOTE: New Reference Range 12/25/12)  SGPT (ALT) 24  SGOT (AST) 25  Total Protein, Serum 8.0  Albumin, Serum  2.9  Routine Chem:  31-Jan-15 12:09   Glucose, Serum  158  BUN 10  Creatinine (comp) 0.92  Sodium, Serum   133  Potassium, Serum  3.3  Chloride, Serum  96  CO2, Serum  34  Calcium (Total), Serum 8.9  Osmolality (calc) 269  eGFR (African American) >60  eGFR (Non-African American) >60 (eGFR values <82m/min/1.73 m2 may be an indication of chronic kidney disease (CKD). Calculated eGFR is useful in patients with stable renal function. The eGFR calculation will not be reliable in acutely ill patients when serum creatinine is changing rapidly. It is not useful in  patients on dialysis. The eGFR calculation may not be applicable to patients at the low and high extremes of body sizes, pregnant women, and vegetarians.)  Anion Gap  3  Lipase 119 (Result(s) reported on 06 Mar 2013 at 12:51PM.)  Cardiac:  31-Jan-15 12:09   Troponin I < 0.02 (0.00-0.05 0.05 ng/mL or less: NEGATIVE  Repeat testing in 3-6 hrs  if clinically indicated. >0.05 ng/mL: POTENTIAL  MYOCARDIAL INJURY. Repeat  testing in 3-6 hrs if  clinically indicated. NOTE: An increase or decrease  of 30% or more on serial  testing suggests a  clinically important change)  Routine UA:  31-Jan-15 12:09   Color (UA) Yellow  Clarity (UA) Hazy  Glucose (UA) Negative  Bilirubin (UA) Negative  Ketones (UA) Negative  Specific Gravity (UA) 1.012  Blood (UA) Negative  pH (UA) 6.0  Protein (UA) Negative  Nitrite (UA) Negative  Leukocyte Esterase (UA)  Negative (Result(s) reported on 06 Mar 2013 at 12:55PM.)  RBC (UA) 1 /HPF  WBC (UA) 1 /HPF  Bacteria (UA) 2+  Epithelial Cells (UA) 3 /HPF  Mucous (UA) PRESENT  Hyaline Cast (UA) 6 /LPF (Result(s) reported on 06 Mar 2013 at 12:55PM.)  Routine Hem:  31-Jan-15 12:09   WBC (CBC)  16.3  RBC (CBC)  3.77  Hemoglobin (CBC)  10.6  Hematocrit (CBC)  33.2  Platelet Count (CBC) 268  MCV 88  MCH 28.1  MCHC 32.0  RDW  15.5  Neutrophil % 81.4  Lymphocyte % 8.8  Monocyte % 8.7  Eosinophil % 0.6  Basophil % 0.5  Neutrophil #  13.3  Lymphocyte # 1.4  Monocyte #  1.4  Eosinophil # 0.1   Basophil # 0.1 (Result(s) reported on 06 Mar 2013 at 12:34PM.)   Radiology Results: CT:    31-Jan-15 18:17, CT Abdomen and Pelvis Without Contrast  CT Abdomen and Pelvis Without Contrast  REASON FOR EXAM:    (1) diffuse abd pain, leukocytosis; (2) diffuse abd   pain, leukocytosis  COMMENTS:   May transport without cardiac monitor    PROCEDURE: CT  - CT ABDOMEN AND PELVIS W0  - Mar 06 2013  6:17PM     CLINICAL DATA:  Diffuse abdominal pain, leukocytosis.    EXAM:  CT ABDOMEN AND PELVIS WITHOUT CONTRAST    TECHNIQUE:  Multidetector CT imaging of the abdomen and pelvis was performed  following the standard protocol without intravenous contrast.  COMPARISON:  DG ABDOMEN 2V dated 03/06/2013    FINDINGS:  Lung bases are clear.    Non IV contrast images demonstrate no focal hepatic lesion. Post  cholecystectomy. The pancreas, spleen, adrenal glands, kidneys are  normal.    The stomach and small bowel are normal. No evidence of bowel  obstruction. Contrast flows into the transverse colon.    There is an inflammatory process in the right lower quadrant  adjacent to the cecum. There is a soft tissue mass with a small  focus of gas within it measuring 2.6 x 3.2 cm (image 58, series 2).  This immediately they is rudimentary are the medial go adenoma. The  arm CT scan on the adjacent to the cecum and the terminal ileum. The  appendix is not identified. This could represent inflamed irregular  appendix. This inflammatory process extends into the sigmoid mesial  colon there are multiple diverticula of the sigmoid mesial colon  this is felt to not represent diverticulitis and is more concerning  for appendicitis. There is additional small focus of extraluminal  gas inferior to thisinflammatory process measuring 4 mm (image 61).  No IV contrast was administered which makes evaluation somewhat  difficult.    Abdominal or is normal caliber. No retroperitoneal periportal  lymphadenopathy.  No free fluid the pelvis. Post hysterectomy  anatomy. The left ovary is small measuring 23 x 15 mm. The right  ovary is not identified.   IMPRESSION:  1. Inflammatory process in the right lower quadrant with a soft  tissue mass with a tiny focus of internal gas and small focus of  extraluminal gas. Differential would include acute appendicitis with  rupture, chronic appendicitis with rupture, right adnexal infection,  or diverticulitis. Favor chronic appendicitis with rupture.  2. Sigmoid diverticulosis.  3.  Findings conveyed Tressia Danas on 03/06/2013  at18:48.      Electronically Signed    By: Suzy Bouchard M.D.    On: 03/06/2013 18:49  Verified By: Rennis Golden, M.D.,    Assessment/Admission Diagnosis prob acute appendicitis with rupture, CT rev'd personally diverticula described options and rationale for dx laparoscopy, lap appy risks of bl inf abscess rec disease neg lap open procedure bowel inj discussed agrees to proceed will disc with anesthesia about pacer/defib   Electronic Signatures: Florene Glen (MD)  (Signed 31-Jan-15 19:59)  Authored: CHIEF COMPLAINT and HISTORY, PAST MEDICAL/SURGIAL HISTORY, ALLERGIES, FAMILY AND SOCIAL HISTORY, REVIEW OF SYSTEMS, PHYSICAL EXAM, LABS, Radiology, ASSESSMENT AND PLAN   Last Updated: 31-Jan-15 19:59 by Florene Glen (MD)

## 2014-05-28 NOTE — Op Note (Signed)
PATIENT NAME:  Stacy Grimes, Stacy Grimes MR#:  [id number] DATE OF BIRTH:  1950-11-23  DATE OF PROCEDURE:  03/06/2013  PREOPERATIVE DIAGNOSIS: Acute appendicitis.   POSTOPERATIVE DIAGNOSIS: Acute diverticulitis with pelvic abscess.   PROCEDURE:  Diagnostic laparoscopy, pelvic washout and drainage of pelvic abscess, biopsy of the right pelvic sidewall inflammatory mass.   SURGEON: Phoebe Perch, M.D.   ANESTHESIA: General with endotracheal tube.   INDICATION: This is a patient with 2 weeks of right lower quadrant pain and tenderness, with some suprapubic and left lower quadrant pain and tenderness, a known diagnosis previously of diverticulosis, and a recent colonoscopy. A CT scan suggested ruptured appendix.   Preoperatively, we discussed rationale for offering surgery and the options of observation, risk of bleeding, infection, recurrence, negative laparoscopy, conversion to an open procedure, bowel injury, and recurrent abscess. This was all reviewed for her. No family was present. She understood and agreed to proceed.   FINDINGS: A pelvic abscess involving the right pelvic sidewall and redundant sigmoid colon seemed to involve the right tube and ovary. Photos were taken. An inflammatory mass near the right tube and ovary became detached during the dissection, and was sent off for examination.   The appendix could not be identified, in spite of taking down the lateral avascular line and reflection to look for a retrocecal appendix. There was no inflammatory process in the pericecal area. This was deep in the pelvis where the inflammatory process was identified.   Note that the appendix was not removed.   DESCRIPTION OF PROCEDURE: The patient was induced with general anesthesia, given IV antibiotics. VTE prophylaxis was in place, and a Foley catheter was placed. She was then prepped and draped in sterile fashion. Marcaine was infiltrated in skin and subcutaneous tissues around the periumbilical area.  Incision was made. Veress needle was placed. Pneumoperitoneum was obtained, and a 5 mm trocar port was placed. The abdominal cavity was explored. The patient was placed in steep Trendelenburg position and rolled to the left side, and under direct vision, a 12 mm left lateral port was placed, followed by a 5 mm suprapubic port. There was no inflammatory process involving the cecum. Manipulation and dissection around the cecum demonstrated a normal terminal ileum without inflammatory process or erythema. The cecum appeared normal, but the appendix could not be identified. Further dissection down into the pelvis demonstrated what appeared to be the tube and ovary, and manipulation of this area resulted in drainage of a greenish, foul-smelling, purulent abscess. This was irrigated and aspirated free, and further dissection demonstrated that this was likely emanating from redundant sigmoid colon in the pelvis, where there was induration and inflammation. Further dissection in an attempt to identify the appendix was futile. Manipulation of this right lower quadrant mass, which was believed to be the tube and ovary, was performed, and a portion of this became detached from the right pelvic sidewall, without bleeding. It was retrieved and sent off for examination, labeled right pelvic sidewall inflammatory mass.  Further irrigation was performed, and then the avascular line laterally was taken down. The appendix could not be identified in the retrocecal area, either, and because of the lack of inflammatory process, no further aggressive dissection was performed to identify what would likely be a normal appendix, as the inflammatory process and abscess was in the pelvis.   The pelvis was washed out again, there was no further bleeding. Therefore, under direct vision, the left lateral port site was closed with multiple simple sutures of 0  Vicryl utilizing an Endo Close technique. Again, hemostasis was adequate.  Pneumoperitoneum was released. All ports were removed. A 4-0 subcuticular Monocryl was used on all skin edges. Steri-Strips, Mastisol and sterile dressings were placed.   The patient tolerated the procedure well. There were no complications. She was taken to the recovery room in stable condition, to be admitted for continued care.    ____________________________ Jerrol Banana. Burt Knack, MD rec:mr D: 03/06/2013 21:44:43 ET T: 03/06/2013 21:56:26 ET JOB#: [id number]  cc: Jerrol Banana. Burt Knack, MD, <Dictator> Florene Glen MD ELECTRONICALLY SIGNED 03/06/2013 22:21

## 2014-05-28 NOTE — H&P (Signed)
PATIENT NAME:  Stacy Grimes, Stacy Grimes MR#:  [id number] DATE OF BIRTH:  1950/02/10  DATE OF ADMISSION:  06/29/2013  CHIEF COMPLAINT: Diverticulitis.  HISTORY OF PRESENT ILLNESS: This is a patient who underwent a diagnostic laparoscopy with washout for what was at the time believed to be appendicitis, but proved to be an acute diverticulitis. Her appendix was not removed at that time. Postoperatively she did well, but over the last several months, she has required multiple trips to the Emergency Room for ongoing abdominal pain with signs of acute diverticulitis without sepsis. She is here for elective sigmoid colectomy and appendectomy.   PAST MEDICAL HISTORY: Severe COPD (oxygen-dependent), CHF, and hypertension.   PAST SURGICAL HISTORY: Pacemaker with AICD, cholecystectomy, wrist plating, ear surgery, and a recent colonoscopy confirming diverticulosis.   ALLERGIES: CODEINE AND IVP DYE.   MEDICATIONS: Multiple. See reconciliation.   FAMILY HISTORY: Noncontributory.   SOCIAL HISTORY: The patient has not smoked for many years. Does not drink alcohol.   REVIEW OF SYSTEMS: Ten-system review is performed and documented in the office chart.   PHYSICAL EXAMINATION: GENERAL: Obese female patient.  VITAL SIGNS: Her BMI is 37. She is afebrile. Vital signs are stable.  HEENT: No scleral icterus.  NECK: No palpable neck nodes.  CHEST: Clear to auscultation.  CARDIAC: Regular rate and rhythm.  ABDOMEN: Soft, nontender.  EXTREMITIES: Without edema.  NEUROLOGIC: Grossly intact.  INTEGUMENTARY: No jaundice.  Work-up has confirmed the presence of diverticulosis and diverticulitis.   Laboratory values are in the chart.   ASSESSMENT AND PLAN: This is a patient with diverticulitis diagnosed at diagnostic laparoscopy. She is here for elective sigmoid colectomy for ongoing diverticular pain and symptoms of diverticulitis with a history of prior perforation. She is also to have an appendectomy at the same  time. The rationale for this has been discussed. The risks of bleeding, infection, ostomy or colostomy, transfusion, and the difficulty with her ongoing chronic obstructive pulmonary disease have been reviewed. She understood and agreed to proceed. She has been cleared by cardiology and pulmonology.   ____________________________ Jerrol Banana Burt Knack, MD rec:jcm D: 06/28/2013 13:27:11 ET T: 06/28/2013 13:58:20 ET JOB#: [id number]  cc: Jerrol Banana. Burt Knack, MD, <Dictator> Florene Glen MD ELECTRONICALLY SIGNED 06/28/2013 17:48

## 2014-05-28 NOTE — Discharge Summary (Signed)
PATIENT NAME:  Stacy Grimes, Stacy Grimes MR#:  [id number] DATE OF BIRTH:  March 10, 1950  DATE OF ADMISSION:  07/31/2013.  DATE OF DISCHARGE:  08/02/2013.  ADMISION DIAGNOSES:   1.  Healthcare-acquired pneumonia. 2.  Abdominal pain.   DISCHARGE DIAGNOSES:  1.  Healthcare-acquired pneumonia.  2.  Abdominal pain.  3.  Hypothyroidism.  4.  Anxiety.  5.  History of congestive heart failure, ejection fraction 50%.  6.  History of chronic obstructive pulmonary disease.   CONSULTATIONS: Surgery.   PERTINENT LABORATORIES AT DISCHARGE:  Sodium 137, potassium 3.7, chloride 99, bicarbonate 33, BUN 11, creatinine 1.12, glucose 106, WBCs 7.5, hemoglobin 11, hematocrit 35, platelets 285,000.   CT scan showed no evidence of anastomosis leak or abscess.   HOSPITAL COURSE:  The patient is a 64 year old female who presented with shortness of breath, abdominal pain.  Found to have pneumonia. For further details please see H and P.    1.  HCAP.  The patient was treated for hospital-acquired pneumonia; she was recently in the hospital after a surgical procedure.  She was placed on broad-spectrum antibiotics. She is actually doing quite well from this standpoint.  She will be discharged on Levaquin. 2.  Abdominal pain with recent sigmoidotomy for diverticulitis.  Surgery was consulted. CT did not show any evidence of abscess or anastomosis leak, as per surgery.  3.  Anxiety. The patient will continue outpatient medications.  4.  Hypothyroidism. On Synthroid.  5.  History of congestive heart failure, EF of 50% by the H and P. Nonischemic.  Patient is continued on Coreg, digoxin, losartan and pravastatin.  6.  History of COPD, chronic respiratory failure, on home O2.  7.  Shoulder pain. The patient was having some shoulder pain, this is definitely musculoskeletal, she was tender on her left shoulder. Supportive care.   DISCHARGE MEDICATIONS:  1.  Aspirin 81 mg daily.  2.  Losartan 50 mg 0.5 tablet daily.  3.   Synthroid 88 mcg daily.  4.  Digoxin 0.125 daily.  5.  Coreg 25 mg 0.5 tablet b.i.d.  6.  Lasix 40 mg b.i.d.  7.  Effexor 75 mg b.i.d.  8.  Acetaminophen/hydrocodone 325/10, 1 tablet 2-3 times a day for back pain.  9.  Allopurinol 300 mg 1.5 tablet daily.  10. Indomethacin 50 mg t.i.d.  11. Cyclobenzaprine 10 mg t.i.d.  12. Spiriva  daily.  13. Ventolin 2 puffs 4 times a day as needed.  14. Daliresp 500 mcg daily.  15. Tizanidine 4 mg b.i.d. p.r.n.  16. Gabapentin 300 mg t.i.d.  17. Ambien 5 mg at bedtime.  18. Prilosec 20 mg daily.  19. Vitamin B12 1000 mcg b.i.d.  20. Docusate 100 mg 1-3 tablets daily.  21. Fish oil 1000 mg b.i.d.  22. Albuterol ipratropium nebulizer 4 times a day p.r.n.  23. Xanax 0.5 mg 0.5 tablet b.i.d. p.r.n.  24. Pravastatin 20 mg daily.  25. Levaquin 750 mg daily for 8 days.   DISCHARGE:  Home health with nurse.   DISCHARGE DIET: Low sodium.   DISCHARGE ACTIVITY: As tolerated.   DISCHARGE FOLLOWUP: The patient is to follow up in 1 week with Dr. Burt Knack and Dr. Cyndi Bender.    TIME SPENT: Approximately 35 minutes.   ____________________________ Donell Beers. Benjie Karvonen, MD spm:lt D: 08/02/2013 12:35:06 ET T: 08/02/2013 13:19:05 ET JOB#: [id number]  cc: Sital P. Benjie Karvonen, MD, <Dictator> Cyndi Bender, MD Dr. Alessandra Grout P MODY MD ELECTRONICALLY SIGNED 08/03/2013 12:24

## 2014-05-28 NOTE — H&P (Signed)
PATIENT NAME:  Stacy Grimes, BACHA MR#:  [id number] DATE OF BIRTH:  1950-10-28  DATE OF ADMISSION:  07/31/2013  PRIMARY CARE PHYSICIAN: Dr. Cyndi Bender at [facility].  PRIMARY CARDIOLOGIST: Dr. Neoma Laming.  PRIMARY PULMONOLOGIST: Dr. Devona Konig.  SURGICAL DOCTOR: [facility], Dr. Burt Knack and Dr. Rexene Edison.  REFERRING EMERGENCY ROOM PHYSICIAN: Dr. Corky Downs.   CHIEF COMPLAINT: Excessive sweating and shortness of breath.   HISTORY OF PRESENT ILLNESS: A 64 year old female with a past medical history of nonischemic cardiomyopathy and status post AICD placement, chronic systolic heart failure, chronic respiratory failure with COPD and oxygen use of 3.5 liters at home with nebulizers at home, restless leg syndrome, hypertension, depression, anxiety, and recent surgery last month with Dr. Rolin Barry team for diverticulitis, had a partial resection of bowel. As per the patient she had on and off abdominal pain after the surgery and had decreased appetite after that. Has episodes of sometimes having chills and sweating but her wound was healing properly. Still had some serous secretions from there and need to change dressing 2-3 times a day. Had seen Dr. Rexene Edison last week in the clinic. They sent some tests but said it is healing satisfactorily and just continue followup, gave some stool softener. For the last 2 to 3 days, the patient's started getting worse, had more sweating and she got profusely sweaty and weak and short of breath while even walking to the bathroom in her house and so decided to come to the Emergency Room. In the ER during workup on chest x-ray found having some infiltrate and so given an admission to hospitalist team for further management.   REVIEW OF SYSTEMS:  CONSTITUTIONAL: Negative for fever. Positive for fatigue and generalized weakness, had weight loss of almost 15 pounds in the last 1 month because of not able to eat properly.  EYES: No blurring, double vision, discharge or redness.   EARS, NOSE, THROAT: No tinnitus, ear pain, or hearing loss. RESPIRATORY: The patient has episodes of shortness of breath. No cough, wheezing, or sputum production.  CARDIOVASCULAR: No chest pain, orthopnea, edema, arrhythmia, palpitation.  GASTROINTESTINAL: No nausea, vomiting, diarrhea, but has abdominal pain and episodes of constipation. Had a recent surgery in the last month. GENITOURINARY: No dysuria, hematuria, or increased frequency.  ENDOCRINE: No heat or cold intolerance, but has excessive sweating episodes.  MUSCULOSKELETAL: No pain or swelling in the joints.  NEUROLOGICAL: No numbness, weakness, tremor, or vertigo.  PSYCHIATRIC: No anxiety, insomnia, bipolar disorder.  PERSONAL MEDICAL HISTORY: 1. Nonischemic cardiomyopathy, ejection fraction was up to 15% in October 2010 and has automatic implantable cardiac defibrillator placement following with Dr. Neoma Laming and as per the husband, recently the ejection fraction came up to 53% before surgery.  2. Chronic systolic heart failure.  3. Chronic respiratory failure with COPD and oxygen use, 3.5 liters at home.  4. Obstructive sleep apnea but noncompliant with CPAP.  5. Restless leg syndrome.  6. Hypertension. 7. Anxiety.  8. Depression.   PAST SURGICAL HISTORY:  1. AICD placement.  2. Cholecystectomy.  3. Recent partial colectomy June 2015.   SOCIAL HISTORY: She quit smoking about 10 years ago. Denies alcohol. Lives at home with husband. Denies any drug abuse.   FAMILY HISTORY: Positive for coronary artery disease.   HOME MEDICATIONS:  1. Zolpidem 5 mg oral tablet once a day at nighttime.  2. Vitamin B12 at 1000 mcg 2 times a day.  3. Ventolin 2 puffs inhalation 4 times a day.  4. Venlafaxine 75 mg  2 times a day.  5. Tizanidine 4 mg oral 2 times a day.  6. Spiriva 18 mcg inhalation once a day.  7. Prilosec 20 mg over-the-counter once a day.  8. Pravastatin 20 mg oral once a day.  9. Losartan 50 mg oral take 1/2  tablet once a day.  10. Levothyroxine 88 mcg once a day.  11. Indomethacin 50 mg oral 3 times a day. 12. Gabapentin 600 mg 3 times a day.  13. Furosemide 40 mg oral 2 times a day.  14. Docusate sodium 1-3 tablets once a day as needed.  16. Cyclobenzaprine 10 mg oral 3 times a day. 17. Carvedilol 25 mg take 1/2 tablet 2 times a day.  18. Bayer Aspirin 81 mg once a day.  19. Alprazolam 0.5 mg oral 2 times a day for anxiety.  20. Allopurinol 300 mg oral once a day.  21. Acetaminophen and hydrocodone 10 mg tablet 2-3 times a day as needed for pain.   PHYSICAL EXAMINATION:  VITAL SIGNS: In ER, temperature 97.9, pulse is 87, respirations 16, blood pressure 159/81.  GENERAL: The patient is fully alert and oriented. She is obese and appears slightly distressed because of pain.  HEENT: Head and neck atraumatic. Conjunctiva pink. Oral mucosa moist.  NECK: Supple. No JVD.  RESPIRATORY: Bilateral equal air entry. Crepitation present.  CARDIOVASCULAR: S1, S2 present, regular. No murmur.  ABDOMEN: Soft. Mild tenderness in the surgical scar area. There is a dressing present and slight oozing from the scar. Bowel sound present. No organomegaly felt.  SKIN: No acne or rashes or lesions.  MUSCULOSKELETAL: No pain or swelling in the joints.  NEUROLOGICAL: No numbness, weakness, tremor follows commands. No rigidity. Power 5/5. PSYCHIATRIC: Does not appear in any acute psychiatric illness at this time.  EXTREMITIES: Legs: No edema.   IMPORTANT LABORATORY RESULTS: Glucose 112, BUN 14, creatinine 0.87, sodium 138, potassium 3.3, chloride is 98, CO2 is 37, calcium is 9.3, total protein 8.2, albumin 3.5, bilirubin 0.2, alkaline phosphatase 121, AST 21, and ALT is 28. Troponin is less than 0.02. WBC 8.9, hemoglobin is 11.1, platelet count is 322,000, MCV is 86. Urinalysis is grossly negative.   Chest x-ray, portable, single view, shows a new left basilar opacity suspicious for pneumonia.   ALLERGY: IV DYE.    ASSESSMENT AND PLAN: A 64 year old female with a past medical history of cardiomyopathy, congestive heart failure, chronic obstructive pulmonary disease, home oxygen use and had surgery last month, came with chronic abdominal pain complaint after surgery and having worsening for the last 2 weeks with excessive sweating and shortness of breath found having infiltrate on chest x-ray. 1. Healthcare-associated pneumonia. As the patient was in the hospital less than a month ago and had surgery, we will treat her with broad-spectrum antibiotic at this time, vancomycin, Zosyn, and Levaquin and collect the blood culture and sputum culture for further guidance for antibiotics.  2. A recent surgery followed by abdominal pain and weight loss with some secretions from the wound. I spoke to the surgical doctor, Dr. Rexene Edison saw her last week in the office and she was doing satisfactory until then but they agreed to see the patient here. We will get as a CAT scan of abdomen without contrast as the patient is allergic to IV dye and will wait for surgical interventions if there is any needed.  3. Hypertension. We will continue carvedilol and losartan.  4. Anxiety. Continue Xanax and zolpidem.  5. Hyperlipidemia. Continue pravastatin.  6. Hypothyroidism.  Continue levothyroxine.  7. Severe, cardiomyopathy status post automatic implantable cardiac defibrillator and chronic systolic heart failure. Continue digoxin and carvedilol. Currently no acute exacerbation.  8. Chronic respiratory failure and chronic obstructive pulmonary disease. Continue her baseline respiratory nebulizer treatment. Currently no wheezing. Will continue oxygen supplementation 3.5 liters as she is using at home.   CODE STATUS: Full code.   TOTAL TIME SPENT ON THIS ADMISSION: 60 minutes.    ____________________________ Ceasar Lund Anselm Jungling, MD vgv:lt D: 07/31/2013 19:09:26 ET T: 07/31/2013 20:39:31 ET JOB#: [id number]  cc: Ceasar Lund.  Anselm Jungling, MD, <Dictator> Rodena Goldmann III, MD Dionisio David, MD Vaughan Basta MD ELECTRONICALLY SIGNED 08/02/2013 11:11

## 2014-05-28 NOTE — H&P (Signed)
PATIENT NAME:  Stacy Grimes, Stacy Grimes MR#:  [id number] DATE OF BIRTH:  [birth year]  DATE OF ADMISSION:  03/06/2013  CHIEF COMPLAINT: Abdominal pain, lower quadrants.   HISTORY OF PRESENT ILLNESS: This is a patient with 2 weeks of abdominal pain in both lower quadrants. She points somewhat to the suprapubic area as the point of maximal tenderness. She has had some nausea, but no emesis. No fevers, but some chills. She has never had an episode like this before. She states that she is frequently constipated from her medications, but had a bowel movement today without melena or hematochezia. She denies back pain. She points to the suprapubic area.   PAST MEDICAL HISTORY: CHF, hypertension, COPD.    PAST SURGICAL HISTORY: Pacemaker, defibrillator in the left chest, cholecystectomy, right wrist plating, and ear surgery of unclear type. Of note, she had a colonoscopy 5 months ago showing diverticula, no diverticulitis.   ALLERGIES:  CODEINE and IVP DYE.   MEDICATIONS: Multiple, see chart.   FAMILY HISTORY: Noncontributory.   SOCIAL HISTORY: The patient is an ex-smoker, stopped many years ago. Does not drink alcohol, and is disabled.   REVIEW OF SYSTEMS:  A 10-system review is performed and otherwise negative, with the exception of that mentioned in the HPI.    PHYSICAL EXAMINATION: GENERAL: Obese female patient with a BMI of 37, weight of 201, 62 inches tall.  VITAL SIGNS: Temp is 97.9, pulse of 96, respirations 22, blood pressure 103/54, 97% sat on supplemental oxygen, pain scale of 6.  HEENT: Shows no scleral icterus, no palpable neck nodes.  CHEST: Clear to auscultation.  CARDIAC: Regular rate and rhythm. There is a left chest pacemaker scar and palpable pacer, defibrillator.  ABDOMEN: Obese, nondistended, non-tympanitic. Tender in the suprapubic, left lower quadrant and right lower quadrant, maximal in the right lower quadrant, with some guarding. There is questionable rebound tenderness in the  right lower quadrant, and percussion tenderness maximal in the right lower quadrant.  EXTREMITIES: Showing minimal edema.  NEUROLOGIC: Grossly intact.  INTEGUMENT: Shows no jaundice.   DATA:  Laboratory values demonstrate a sodium of 133, potassium 3.3, chloride 96, CO2 of 34, lipase 119. Troponins are negative. White blood cell count 16.3, H and H of 10 and 33, with a platelet count of 268.   CT scan is personally reviewed, suggesting both diverticulosis without obvious inflammatory process, and a right lower quadrant inflammatory process suggesting a ruptured appendix.   ASSESSMENT AND PLAN: This is a patient with probable ruptured appendix. I have discussed with her the rationale for offering diagnostic laparoscopy, the options of IV antibiotic therapy, the risks of bleeding, infection, abscess formation, recurrent disease, failure to resolve all of her symptoms, a negative laparoscopy, an open procedure, and bowel injury. This was all reviewed for her. No family was present. She understood and agreed to proceed.     ____________________________ Jerrol Banana Burt Knack, MD rec:mr D: 03/06/2013 20:14:25 ET T: 03/06/2013 20:25:30 ET JOB#: [id number]  cc: Jerrol Banana. Burt Knack, MD, <Dictator> Florene Glen MD ELECTRONICALLY SIGNED 03/06/2013 22:21

## 2014-05-28 NOTE — Op Note (Signed)
PATIENT NAME:  Stacy Grimes, Stacy Grimes MR#:  [id number] DATE OF BIRTH:  April 22, 1950  DATE OF PROCEDURE:  10/13/2013  PREOPERATIVE DIAGNOSIS: Abdominal pain, possible rectal stricture.   POSTOPERATIVE DIAGNOSIS: Abdominal pain. Normal rigid sigmoidoscopy.   PROCEDURE: Rigid sigmoidoscopy and examination under anesthesia.   SURGEON: Amry Cathy E. Burt Knack, MD  ANESTHESIA: Monitored anesthesia care.   INDICATIONS: This patient has had a prior colon resection for acute diverticulitis and presents with chronic back pain, leg pain, abdominal pain and problems with bowel movements. A CT scan has suggested the findings of a "seroma" near the anastomosis, which I believe, on personal review of the multiple CT scans, that this is the blind pouch or the proximal rectum just proximal to the EEA stapled anastomosis.   The patient has been having problems with bowel movements, and because of all these symptoms, it was decided to perform a rigid sigmoidoscopy to ensure that she did not have a strictured anastomosis.   FINDINGS: Normal anastomosis, easily passed with the rigid sigmoidoscope. No sign of stricture. Typical EEA stapled anastomosis with proximal pouch. No pathology noted. Liquid stool in rectum.   DESCRIPTION OF PROCEDURE: The patient was induced to monitored anesthesia care, placed into high lithotomy position with candy-cane stirrups. A surgical pause was performed, and then an examination under anesthesia was performed with a rectal exam. The sigmoidoscope was placed gently into the vagina to ensure that there was no stricture. There was none.   The sigmoidoscope was then placed into the anus and advanced. Liquid stool was aspirated, and the sigmoidoscope passed easily passed the anastomosis into the proximal colon. There was no sign of stricture, no narrowing whatsoever. The typical stapled EEA anastomosis was identified with the proximal pouch, which is typical and normal.   The patient was then taken  down out of lithotomy position and taken to the recovery room in stable condition, to be discharged in the care of her family and follow up as needed.    ____________________________ Jerrol Banana Burt Knack, MD rec:lb D: 10/13/2013 08:17:44 ET T: 10/13/2013 11:56:25 ET JOB#: [id number]  cc: Jerrol Banana. Burt Knack, MD, <Dictator> Florene Glen MD ELECTRONICALLY SIGNED 10/13/2013 16:25

## 2014-05-28 NOTE — Discharge Summary (Signed)
PATIENT NAME:  Stacy Grimes, Stacy Grimes MR#:  [id number] DATE OF BIRTH:  Oct 12, 1950  DATE OF ADMISSION:  06/29/2013 DATE OF DISCHARGE:  07/07/2013  DIAGNOSES:   1.  Diverticulitis.  2.  Severe chronic obstructive pulmonary disease with oxygen dependence.  3.  Congestive heart failure.  4.  Hypertension.   PROCEDURES:  Colon resection with low pelvic anastomosis.   CONSULTANTS:  None.   HISTORY OF PRESENT ILLNESS AND HOSPITAL COURSE:  This is a patient with a history of perforated diverticulitis which was diagnosed at time of laparoscopy for believed ruptured appendix.  Her abscess was clearly related to perforated diverticulitis.  At that time a washout had been performed with the intention of coming back and performing colon resection.  After clearance was obtained both from pulmonary and cardiology, she was taken to the Operating Room where colon resection was performed with a low pelvic stapled anastomosis.  Postoperatively, she was slow to return to function, but ultimately was tolerating a regular diet, was discharged in stable condition to follow up in our office in 10 days.  She was given oral analgesics and discharged on her home medications.    ____________________________ Jerrol Banana Burt Knack, MD rec:ea D: 07/15/2013 23:32:56 ET T: 07/16/2013 00:49:42 ET JOB#: [id number]  cc: Jerrol Banana. Burt Knack, MD, <Dictator> Florene Glen MD ELECTRONICALLY SIGNED 07/21/2013 14:14

## 2014-05-31 ENCOUNTER — Ambulatory Visit (INDEPENDENT_AMBULATORY_CARE_PROVIDER_SITE_OTHER): Payer: Medicare Other | Admitting: *Deleted

## 2014-05-31 ENCOUNTER — Encounter: Payer: Self-pay | Admitting: Internal Medicine

## 2014-05-31 DIAGNOSIS — I429 Cardiomyopathy, unspecified: Secondary | ICD-10-CM

## 2014-05-31 DIAGNOSIS — I428 Other cardiomyopathies: Secondary | ICD-10-CM

## 2014-05-31 LAB — MDC_IDC_ENUM_SESS_TYPE_REMOTE
Battery Remaining Longevity: 26 mo
Battery Remaining Percentage: 33 %
Battery Voltage: 2.86 V
Brady Statistic AP VP Percent: 7.4 %
Brady Statistic AP VS Percent: 1 %
Brady Statistic AS VP Percent: 92 %
Brady Statistic AS VS Percent: 1 %
Brady Statistic RA Percent Paced: 7.2 %
HIGH POWER IMPEDANCE MEASURED VALUE: 52 Ohm
Lead Channel Impedance Value: 640 Ohm
Lead Channel Impedance Value: 760 Ohm
Lead Channel Pacing Threshold Amplitude: 1.125 V
Lead Channel Pacing Threshold Pulse Width: 0.5 ms
Lead Channel Sensing Intrinsic Amplitude: 12 mV
Lead Channel Sensing Intrinsic Amplitude: 5 mV
Lead Channel Setting Pacing Amplitude: 2.125
Lead Channel Setting Pacing Amplitude: 2.5 V
Lead Channel Setting Pacing Amplitude: 2.5 V
Lead Channel Setting Pacing Pulse Width: 0.5 ms
Lead Channel Setting Sensing Sensitivity: 0.5 mV
MDC IDC MSMT LEADCHNL LV PACING THRESHOLD AMPLITUDE: 1 V
MDC IDC MSMT LEADCHNL LV PACING THRESHOLD PULSEWIDTH: 0.5 ms
MDC IDC MSMT LEADCHNL RA IMPEDANCE VALUE: 360 Ohm
MDC IDC MSMT LEADCHNL RV PACING THRESHOLD AMPLITUDE: 0.75 V
MDC IDC MSMT LEADCHNL RV PACING THRESHOLD PULSEWIDTH: 0.8 ms
MDC IDC PG SERIAL: 605521
MDC IDC SESS DTM: 20160426060010
MDC IDC SET LEADCHNL RV PACING PULSEWIDTH: 0.8 ms
Zone Setting Detection Interval: 250 ms
Zone Setting Detection Interval: 300 ms

## 2014-05-31 NOTE — Progress Notes (Signed)
Remote ICD transmission.   

## 2014-06-14 ENCOUNTER — Ambulatory Visit
Admission: RE | Admit: 2014-06-14 | Discharge: 2014-06-14 | Disposition: A | Discharge status: Home / Self Care | Payer: Medicare Other | Source: Ambulatory Visit | Attending: Physician Assistant | Admitting: Physician Assistant

## 2014-06-14 ENCOUNTER — Other Ambulatory Visit: Payer: Self-pay | Admitting: Physician Assistant

## 2014-06-14 DIAGNOSIS — M545 Low back pain: Secondary | ICD-10-CM

## 2014-06-14 DIAGNOSIS — M4856XA Collapsed vertebra, not elsewhere classified, lumbar region, initial encounter for fracture: Secondary | ICD-10-CM | POA: Diagnosis not present

## 2014-06-24 ENCOUNTER — Encounter: Payer: Self-pay | Admitting: Cardiology

## 2014-06-30 DIAGNOSIS — M5136 Other intervertebral disc degeneration, lumbar region: Secondary | ICD-10-CM | POA: Diagnosis not present

## 2014-06-30 DIAGNOSIS — G894 Chronic pain syndrome: Secondary | ICD-10-CM | POA: Diagnosis not present

## 2014-07-06 DIAGNOSIS — M545 Low back pain: Secondary | ICD-10-CM | POA: Diagnosis not present

## 2014-07-06 DIAGNOSIS — G629 Polyneuropathy, unspecified: Secondary | ICD-10-CM | POA: Diagnosis not present

## 2014-07-06 DIAGNOSIS — Z6839 Body mass index (BMI) 39.0-39.9, adult: Secondary | ICD-10-CM | POA: Diagnosis not present

## 2014-07-09 DIAGNOSIS — R0602 Shortness of breath: Secondary | ICD-10-CM | POA: Diagnosis not present

## 2014-07-15 DIAGNOSIS — S32020A Wedge compression fracture of second lumbar vertebra, initial encounter for closed fracture: Secondary | ICD-10-CM | POA: Diagnosis not present

## 2014-07-15 DIAGNOSIS — M545 Low back pain: Secondary | ICD-10-CM | POA: Diagnosis not present

## 2014-07-18 DIAGNOSIS — I420 Dilated cardiomyopathy: Secondary | ICD-10-CM | POA: Diagnosis not present

## 2014-07-18 DIAGNOSIS — J9611 Chronic respiratory failure with hypoxia: Secondary | ICD-10-CM | POA: Diagnosis not present

## 2014-07-18 DIAGNOSIS — I10 Essential (primary) hypertension: Secondary | ICD-10-CM | POA: Diagnosis not present

## 2014-07-18 DIAGNOSIS — E785 Hyperlipidemia, unspecified: Secondary | ICD-10-CM | POA: Diagnosis not present

## 2014-07-18 DIAGNOSIS — J449 Chronic obstructive pulmonary disease, unspecified: Secondary | ICD-10-CM | POA: Diagnosis not present

## 2014-07-18 DIAGNOSIS — I509 Heart failure, unspecified: Secondary | ICD-10-CM | POA: Diagnosis not present

## 2014-07-18 DIAGNOSIS — I340 Nonrheumatic mitral (valve) insufficiency: Secondary | ICD-10-CM | POA: Diagnosis not present

## 2014-07-18 DIAGNOSIS — J439 Emphysema, unspecified: Secondary | ICD-10-CM | POA: Diagnosis not present

## 2014-07-19 ENCOUNTER — Other Ambulatory Visit: Payer: Self-pay | Admitting: Orthopedic Surgery

## 2014-07-19 DIAGNOSIS — S32020A Wedge compression fracture of second lumbar vertebra, initial encounter for closed fracture: Secondary | ICD-10-CM

## 2014-07-22 ENCOUNTER — Encounter
Admission: RE | Admit: 2014-07-22 | Discharge: 2014-07-22 | Disposition: A | Discharge status: Home / Self Care | Payer: Medicare Other | Source: Ambulatory Visit | Attending: Orthopedic Surgery | Admitting: Orthopedic Surgery

## 2014-07-22 DIAGNOSIS — S32020A Wedge compression fracture of second lumbar vertebra, initial encounter for closed fracture: Secondary | ICD-10-CM | POA: Diagnosis not present

## 2014-07-22 DIAGNOSIS — M4316 Spondylolisthesis, lumbar region: Secondary | ICD-10-CM | POA: Diagnosis not present

## 2014-07-22 DIAGNOSIS — S32038D Other fracture of third lumbar vertebra, subsequent encounter for fracture with routine healing: Secondary | ICD-10-CM | POA: Diagnosis not present

## 2014-07-22 DIAGNOSIS — X58XXXA Exposure to other specified factors, initial encounter: Secondary | ICD-10-CM | POA: Diagnosis not present

## 2014-07-22 DIAGNOSIS — M5136 Other intervertebral disc degeneration, lumbar region: Secondary | ICD-10-CM | POA: Diagnosis not present

## 2014-07-22 DIAGNOSIS — S32010D Wedge compression fracture of first lumbar vertebra, subsequent encounter for fracture with routine healing: Secondary | ICD-10-CM | POA: Diagnosis not present

## 2014-07-22 MED ORDER — TECHNETIUM TC 99M MEDRONATE IV KIT
25.0000 | PACK | Freq: Once | INTRAVENOUS | Status: AC | PRN
  Administered 2014-07-22: 21.73 via INTRAVENOUS

## 2014-07-25 DIAGNOSIS — S32008A Other fracture of unspecified lumbar vertebra, initial encounter for closed fracture: Secondary | ICD-10-CM | POA: Diagnosis not present

## 2014-07-26 DIAGNOSIS — J449 Chronic obstructive pulmonary disease, unspecified: Secondary | ICD-10-CM | POA: Diagnosis not present

## 2014-07-26 DIAGNOSIS — M545 Low back pain: Secondary | ICD-10-CM | POA: Diagnosis not present

## 2014-07-26 DIAGNOSIS — M109 Gout, unspecified: Secondary | ICD-10-CM | POA: Diagnosis not present

## 2014-07-26 DIAGNOSIS — Z6836 Body mass index (BMI) 36.0-36.9, adult: Secondary | ICD-10-CM | POA: Diagnosis not present

## 2014-07-26 DIAGNOSIS — G629 Polyneuropathy, unspecified: Secondary | ICD-10-CM | POA: Diagnosis not present

## 2014-08-01 DIAGNOSIS — R531 Weakness: Secondary | ICD-10-CM | POA: Diagnosis not present

## 2014-08-01 DIAGNOSIS — Z6836 Body mass index (BMI) 36.0-36.9, adult: Secondary | ICD-10-CM | POA: Diagnosis not present

## 2014-08-01 DIAGNOSIS — R21 Rash and other nonspecific skin eruption: Secondary | ICD-10-CM | POA: Diagnosis not present

## 2014-08-01 DIAGNOSIS — R197 Diarrhea, unspecified: Secondary | ICD-10-CM | POA: Diagnosis not present

## 2014-08-03 DIAGNOSIS — L739 Follicular disorder, unspecified: Secondary | ICD-10-CM | POA: Diagnosis not present

## 2014-08-03 DIAGNOSIS — Z6836 Body mass index (BMI) 36.0-36.9, adult: Secondary | ICD-10-CM | POA: Diagnosis not present

## 2014-08-19 DIAGNOSIS — H2513 Age-related nuclear cataract, bilateral: Secondary | ICD-10-CM | POA: Diagnosis not present

## 2014-08-22 ENCOUNTER — Encounter: Payer: Self-pay | Admitting: Gastroenterology

## 2014-08-22 ENCOUNTER — Other Ambulatory Visit: Payer: Medicare Other

## 2014-08-24 ENCOUNTER — Other Ambulatory Visit: Payer: Medicare Other

## 2014-08-24 DIAGNOSIS — H2513 Age-related nuclear cataract, bilateral: Secondary | ICD-10-CM | POA: Diagnosis not present

## 2014-08-29 DIAGNOSIS — I420 Dilated cardiomyopathy: Secondary | ICD-10-CM | POA: Diagnosis not present

## 2014-08-29 DIAGNOSIS — I10 Essential (primary) hypertension: Secondary | ICD-10-CM | POA: Diagnosis not present

## 2014-08-29 DIAGNOSIS — H2513 Age-related nuclear cataract, bilateral: Secondary | ICD-10-CM | POA: Diagnosis not present

## 2014-08-29 DIAGNOSIS — I509 Heart failure, unspecified: Secondary | ICD-10-CM | POA: Diagnosis not present

## 2014-08-30 ENCOUNTER — Encounter: Payer: Self-pay | Admitting: *Deleted

## 2014-09-01 ENCOUNTER — Ambulatory Visit
Admission: RE | Admit: 2014-09-01 | Discharge: 2014-09-01 | Disposition: A | Discharge status: Home / Self Care | Payer: Medicare Other | Source: Ambulatory Visit | Attending: Ophthalmology | Admitting: Ophthalmology

## 2014-09-01 ENCOUNTER — Ambulatory Visit: Payer: Medicare Other | Admitting: Anesthesiology

## 2014-09-01 ENCOUNTER — Encounter
Admission: RE | Disposition: A | Discharge status: Home / Self Care | Payer: Self-pay | Source: Ambulatory Visit | Attending: Ophthalmology

## 2014-09-01 ENCOUNTER — Encounter: Payer: Self-pay | Admitting: *Deleted

## 2014-09-01 DIAGNOSIS — M1990 Unspecified osteoarthritis, unspecified site: Secondary | ICD-10-CM | POA: Diagnosis not present

## 2014-09-01 DIAGNOSIS — K219 Gastro-esophageal reflux disease without esophagitis: Secondary | ICD-10-CM | POA: Diagnosis not present

## 2014-09-01 DIAGNOSIS — Z87891 Personal history of nicotine dependence: Secondary | ICD-10-CM | POA: Insufficient documentation

## 2014-09-01 DIAGNOSIS — F329 Major depressive disorder, single episode, unspecified: Secondary | ICD-10-CM | POA: Insufficient documentation

## 2014-09-01 DIAGNOSIS — I509 Heart failure, unspecified: Secondary | ICD-10-CM | POA: Diagnosis not present

## 2014-09-01 DIAGNOSIS — Z91041 Radiographic dye allergy status: Secondary | ICD-10-CM | POA: Diagnosis not present

## 2014-09-01 DIAGNOSIS — I10 Essential (primary) hypertension: Secondary | ICD-10-CM | POA: Insufficient documentation

## 2014-09-01 DIAGNOSIS — Z9981 Dependence on supplemental oxygen: Secondary | ICD-10-CM | POA: Diagnosis not present

## 2014-09-01 DIAGNOSIS — J449 Chronic obstructive pulmonary disease, unspecified: Secondary | ICD-10-CM | POA: Insufficient documentation

## 2014-09-01 DIAGNOSIS — H2512 Age-related nuclear cataract, left eye: Secondary | ICD-10-CM | POA: Insufficient documentation

## 2014-09-01 DIAGNOSIS — F419 Anxiety disorder, unspecified: Secondary | ICD-10-CM | POA: Diagnosis not present

## 2014-09-01 DIAGNOSIS — I739 Peripheral vascular disease, unspecified: Secondary | ICD-10-CM | POA: Diagnosis not present

## 2014-09-01 DIAGNOSIS — Z950 Presence of cardiac pacemaker: Secondary | ICD-10-CM | POA: Diagnosis not present

## 2014-09-01 HISTORY — DX: Palpitations: R00.2

## 2014-09-01 HISTORY — DX: Angina pectoris, unspecified: I20.9

## 2014-09-01 HISTORY — DX: Dizziness and giddiness: R42

## 2014-09-01 HISTORY — DX: Dependence on supplemental oxygen: Z99.81

## 2014-09-01 HISTORY — DX: Unspecified osteoarthritis, unspecified site: M19.90

## 2014-09-01 HISTORY — DX: Cardiac arrhythmia, unspecified: I49.9

## 2014-09-01 HISTORY — DX: Wheezing: R06.2

## 2014-09-01 HISTORY — PX: CATARACT EXTRACTION W/PHACO: SHX586

## 2014-09-01 HISTORY — DX: Other injury of unspecified body region, initial encounter: T14.8XXA

## 2014-09-01 HISTORY — DX: Reserved for inherently not codable concepts without codable children: IMO0001

## 2014-09-01 SURGERY — PHACOEMULSIFICATION, CATARACT, WITH IOL INSERTION
Anesthesia: Monitor Anesthesia Care | Site: Eye | Laterality: Left | Wound class: Clean

## 2014-09-01 MED ORDER — BSS IO SOLN
INTRAOCULAR | Status: DC | PRN
  Administered 2014-09-01: 250 mL via INTRAOCULAR

## 2014-09-01 MED ORDER — EPINEPHRINE HCL 1 MG/ML IJ SOLN
INTRAMUSCULAR | Status: AC
  Filled 2014-09-01: qty 1

## 2014-09-01 MED ORDER — TETRACAINE HCL 0.5 % OP SOLN
OPHTHALMIC | Status: AC
  Filled 2014-09-01: qty 2

## 2014-09-01 MED ORDER — TETRACAINE HCL 1 % IJ SOLN
INTRAMUSCULAR | Status: AC
  Filled 2014-09-01: qty 2

## 2014-09-01 MED ORDER — TETRACAINE HCL 0.5 % OP SOLN
OPHTHALMIC | Status: DC | PRN
  Administered 2014-09-01: 2 [drp] via OPHTHALMIC

## 2014-09-01 MED ORDER — MOXIFLOXACIN HCL 0.5 % OP SOLN
1.0000 [drp] | OPHTHALMIC | Status: AC | PRN
  Administered 2014-09-01 (×3): 1 [drp] via OPHTHALMIC

## 2014-09-01 MED ORDER — MIDAZOLAM HCL 5 MG/5ML IJ SOLN
INTRAMUSCULAR | Status: DC | PRN
  Administered 2014-09-01 (×3): 1 mg via INTRAVENOUS

## 2014-09-01 MED ORDER — SODIUM CHLORIDE 0.9 % IV SOLN
INTRAVENOUS | Status: DC
  Administered 2014-09-01: 08:00:00 via INTRAVENOUS

## 2014-09-01 MED ORDER — CYCLOPENTOLATE HCL 2 % OP SOLN
1.0000 [drp] | OPHTHALMIC | Status: AC | PRN
  Administered 2014-09-01 (×4): 1 [drp] via OPHTHALMIC

## 2014-09-01 MED ORDER — NA HYALUR & NA CHOND-NA HYALUR 0.55-0.5 ML IO KIT
PACK | INTRAOCULAR | Status: AC
  Filled 2014-09-01: qty 1.05

## 2014-09-01 MED ORDER — MOXIFLOXACIN HCL 0.5 % OP SOLN
OPHTHALMIC | Status: DC | PRN
  Administered 2014-09-01: 1 [drp] via OPHTHALMIC

## 2014-09-01 MED ORDER — LIDOCAINE HCL (PF) 4 % IJ SOLN
INTRAMUSCULAR | Status: AC
  Filled 2014-09-01: qty 5

## 2014-09-01 MED ORDER — LIDOCAINE HCL (PF) 1 % IJ SOLN
INTRAOCULAR | Status: DC | PRN
  Administered 2014-09-01: 4 mL via OPHTHALMIC

## 2014-09-01 MED ORDER — PHENYLEPHRINE HCL 10 % OP SOLN
1.0000 [drp] | OPHTHALMIC | Status: DC | PRN
  Administered 2014-09-01 (×4): 1 [drp] via OPHTHALMIC

## 2014-09-01 MED ORDER — PHENYLEPHRINE HCL 10 % OP SOLN
OPHTHALMIC | Status: AC
  Administered 2014-09-01: 1 [drp] via OPHTHALMIC
  Filled 2014-09-01: qty 5

## 2014-09-01 MED ORDER — PROPARACAINE HCL 0.5 % OP SOLN: 1.0000 [drp] | Freq: Once | OPHTHALMIC | Status: DC

## 2014-09-01 MED ORDER — MOXIFLOXACIN HCL 0.5 % OP SOLN
OPHTHALMIC | Status: AC
  Administered 2014-09-01: 1 [drp] via OPHTHALMIC
  Filled 2014-09-01: qty 3

## 2014-09-01 MED ORDER — CEFUROXIME OPHTHALMIC INJECTION 1 MG/0.1 ML
INJECTION | OPHTHALMIC | Status: DC | PRN
  Administered 2014-09-01: 0.1 mL via INTRACAMERAL

## 2014-09-01 MED ORDER — CEFUROXIME OPHTHALMIC INJECTION 1 MG/0.1 ML
INJECTION | OPHTHALMIC | Status: AC
  Filled 2014-09-01: qty 0.1

## 2014-09-01 MED ORDER — CYCLOPENTOLATE HCL 2 % OP SOLN
OPHTHALMIC | Status: AC
  Administered 2014-09-01: 1 [drp] via OPHTHALMIC
  Filled 2014-09-01: qty 2

## 2014-09-01 MED ORDER — NA HYALUR & NA CHOND-NA HYALUR 0.4-0.35 ML IO KIT
PACK | INTRAOCULAR | Status: DC | PRN
Start: 2014-09-01 — End: 2014-09-01
  Administered 2014-09-01: .4 mL via INTRAOCULAR

## 2014-09-01 MED ORDER — TETRACAINE HCL 0.5 % OP SOLN: 1.0000 [drp] | OPHTHALMIC | Status: DC | PRN

## 2014-09-01 SURGICAL SUPPLY — 24 items
CUP MEDICINE 2OZ PLAST GRAD ST (MISCELLANEOUS) ×3 IMPLANT
GLOVE BIO SURGEON STRL SZ7 (GLOVE) ×3 IMPLANT
GLOVE SURG LX 6.5 MICRO (GLOVE) ×4
GLOVE SURG LX STRL 6.5 MICRO (GLOVE) ×2 IMPLANT
GOWN STRL REUS W/ TWL LRG LVL3 (GOWN DISPOSABLE) ×2 IMPLANT
GOWN STRL REUS W/TWL LRG LVL3 (GOWN DISPOSABLE) ×6
LENS IOL ACRSF IQ PC 21.0 (Intraocular Lens) IMPLANT
LENS IOL ACRYSOF IQ POST 21.0 (Intraocular Lens) ×3 IMPLANT
NDL FILTER BLUNT 18X1 1/2 (NEEDLE) ×1 IMPLANT
NEEDLE FILTER BLUNT 18X 1/2SAF (NEEDLE) ×2
NEEDLE FILTER BLUNT 18X1 1/2 (NEEDLE) ×1 IMPLANT
PACK CATARACT (MISCELLANEOUS) ×3 IMPLANT
PACK CATARACT BRASINGTON LX (MISCELLANEOUS) ×3 IMPLANT
PACK EYE AFTER SURG (MISCELLANEOUS) ×3 IMPLANT
SOL BSS BAG (MISCELLANEOUS) ×3
SOL PREP PVP 2OZ (MISCELLANEOUS) ×3
SOLUTION BSS BAG (MISCELLANEOUS) ×1 IMPLANT
SOLUTION PREP PVP 2OZ (MISCELLANEOUS) ×1 IMPLANT
SPEARS ×2 IMPLANT
SYR 3ML LL SCALE MARK (SYRINGE) ×6 IMPLANT
SYR 5ML LL (SYRINGE) ×3 IMPLANT
SYR TB 1ML 27GX1/2 LL (SYRINGE) ×3 IMPLANT
WATER STERILE IRR 1000ML POUR (IV SOLUTION) ×3 IMPLANT
WIPE NON LINTING 3.25X3.25 (MISCELLANEOUS) ×3 IMPLANT

## 2014-09-01 NOTE — Progress Notes (Signed)
Proparacaine left eye one drop at 0725a.

## 2014-09-01 NOTE — H&P (Signed)
The history and physical was faxed to the hospital. The history and physical was reviewed by me and no changes have occurred.   

## 2014-09-01 NOTE — Transfer of Care (Signed)
Immediate Anesthesia Transfer of Care Note  Patient: Stacy Grimes  Procedure(s) Performed: Procedure(s) with comments: CATARACT EXTRACTION PHACO AND INTRAOCULAR LENS PLACEMENT (IOC) (Left) - [place]: 1:12.1 AP%:16.1 CDE:  11.62 Fluid Lot # [id number]  Patient Location: Short Stay  Anesthesia Type:MAC  Level of Consciousness: awake, alert , oriented and patient cooperative  Airway & Oxygen Therapy: Patient Spontanous Breathing  Post-op Assessment: Report given to RN, Post -op Vital signs reviewed and stable and Patient moving all extremities X 4  Post vital signs: Reviewed and stable  Last Vitals:  Filed Vitals:   09/01/14 0917  BP: 111/47  Pulse: 76  Temp: 36.8 C  Resp: 18    Complications: No apparent anesthesia complications

## 2014-09-01 NOTE — Anesthesia Preprocedure Evaluation (Signed)
Anesthesia Evaluation  Patient identified by MRN, date of birth, ID band Patient awake    Reviewed: Allergy & Precautions, NPO status , Patient's Chart, lab work & pertinent test results  History of Anesthesia Complications Negative for: history of anesthetic complications  Airway Mallampati: III  TM Distance: >3 FB Neck ROM: Full    Dental  (+) Edentulous Lower, Edentulous Upper   Pulmonary shortness of breath and at rest, sleep apnea , COPD COPD inhaler and oxygen dependent, former smoker,          Cardiovascular hypertension, Pt. on medications + angina (rare) with exertion + Peripheral Vascular Disease and +CHF + dysrhythmias + pacemaker + Cardiac Defibrillator + Valvular Problems/Murmurs     Neuro/Psych Anxiety Depression    GI/Hepatic GERD-  Medicated and Controlled,  Endo/Other    Renal/GU      Musculoskeletal  (+) Arthritis -, Osteoarthritis,    Abdominal   Peds  Hematology   Anesthesia Other Findings   Reproductive/Obstetrics                             Anesthesia Physical Anesthesia Plan  ASA: III  Anesthesia Plan: MAC   Post-op Pain Management:    Induction: Intravenous  Airway Management Planned: Nasal Cannula  Additional Equipment:   Intra-op Plan:   Post-operative Plan:   Informed Consent: I have reviewed the patients History and Physical, chart, labs and discussed the procedure including the risks, benefits and alternatives for the proposed anesthesia with the patient or authorized representative who has indicated his/her understanding and acceptance.     Plan Discussed with:   Anesthesia Plan Comments:         Anesthesia Quick Evaluation

## 2014-09-01 NOTE — Anesthesia Postprocedure Evaluation (Signed)
  Anesthesia Post-op Note  Patient: Stacy Grimes  Procedure(s) Performed: Procedure(s) with comments: CATARACT EXTRACTION PHACO AND INTRAOCULAR LENS PLACEMENT (IOC) (Left) - [place]: 1:12.1 AP%:16.1 CDE:  11.62 Fluid Lot # [id number]  Anesthesia type:MAC  Patient location: short stay  Post pain: Pain level controlled  Post assessment: Post-op Vital signs reviewed, Patient's Cardiovascular Status Stable, Respiratory Function Stable, Patent Airway and No signs of Nausea or vomiting  Post vital signs: Reviewed and stable  Last Vitals:  Filed Vitals:   09/01/14 0917  BP: 111/47  Pulse: 76  Temp: 36.8 C  Resp: 18    Level of consciousness: awake, alert  and patient cooperative  Complications: No apparent anesthesia complications

## 2014-09-01 NOTE — Op Note (Signed)
09/01/2014  PRE-OP DIAGNOSIS: Cataract (ICD-10 H25.12) Nuclear sclerotic cataract, LEFT EYE  Post operative diagnosis: Cataract (ICD-10 H25.12) Nuclear sclerotic cataract, LEFT EYE  Procedure: Phacoemulsification with introcular lens [id number]([id number])   SURGEON: Surgeon(s) and Role:    * Lyla Glassing, MD - Primary  ANESTHESIA: Choice   ESTIMATED BLOOD LOSS: MINIMAL  COMPLICATIONS: None  OPERATIVE DESCRIPTION:  Therapeutic options were discussed with the patient preoperatively, including a discussion of risks and benefits of surgery.  Informed consent was obtained. A dilated fundus exam was performed within 6 months.   The patient was premedicated and brought to the operating room and placed on the operating table in the supine position.  Topical tetracaine was instilled.  After adequate anesthesia, the patient was prepped and draped in the usual fashion.  A wire lid speculum was inserted and the microscope was positioned.  A sideport was used to create a paracentesis site and a mixture of preservative-free lidocaine, BSS, and epinephrine was was instilled into the anterior chamber, followed by viscoelastic.  A clear corneal incision was created using a keratome blade.  Capsulorrhexis was then performed.  In situ phacoemulsification was performed.  Cortical material was removed with the irrigation-aspiration unit.  Viscoelastic was instilled to open the capsular bag.  A posterior chamber intraocular lens, model 21.0 diopters, was inserted and positioned.  Irrigation-aspiration was used to remove all viscoelastic. Intracameral cefuroxime was injected into the eye. Wounds were checked for leakage and confirmed to be secure.  Lid speculum was removed and a shield was placed over the eye.  Patient was returned to the recovery room in stable condition.  IMPLANTS:   Implant Name Type Inv. Item Serial No. Manufacturer Lot No. LRB No. Used  IMPLANT LENS - [id number] Intraocular Lens IMPLANT LENS  [id number] ALCON   Left 1     Postoperative care and discharge medication counseling was discussed with the patient or the parents prior to discharge

## 2014-09-01 NOTE — Discharge Instructions (Signed)
Cataract Surgery Care After Refer to this sheet in the next few weeks. These instructions provide you with information on caring for yourself after your procedure. Your caregiver may also give you more specific instructions. Your treatment has been planned according to current medical practices, but problems sometimes occur. Call your caregiver if you have any problems or questions after your procedure.  HOME CARE INSTRUCTIONS   Avoid strenuous activities as directed by your caregiver.  Ask your caregiver when you can resume driving.  Use eyedrops or other medicines to help healing and control pressure inside your eye as directed by your caregiver.  Only take over-the-counter or prescription medicines for pain, discomfort, or fever as directed by your caregiver.  Do not to touch or rub your eyes.  You may be instructed to use a protective shield during the first few days and nights after surgery. If not, wear sunglasses to protect your eyes. This is to protect the eye from pressure or from being accidentally bumped.  Keep the area around your eye clean and dry. Avoid swimming or allowing water to hit you directly in the face while showering. Keep soap and shampoo out of your eyes.  Do not bend or lift heavy objects. Bending increases pressure in the eye. You can walk, climb stairs, and do light household chores.  Do not put a contact lens into the eye that had surgery until your caregiver says it is okay to do so.  Ask your doctor when you can return to work. This will depend on the kind of work that you do. If you work in a dusty environment, you may be advised to wear protective eyewear for a period of time.  Ask your caregiver when it will be safe to engage in sexual activity.  Continue with your regular eye exams as directed by your caregiver. What to expect:  It is normal to feel itching and mild discomfort for a few days after cataract surgery. Some fluid discharge is also common,  and your eye may be sensitive to light and touch.  After 1 to 2 days, even moderate discomfort should disappear. In most cases, healing will take about 6 weeks.  If you received an intraocular lens (IOL), you may notice that colors are very bright or have a blue tinge. Also, if you have been in bright sunlight, everything may appear reddish for a few hours. If you see these color tinges, it is because your lens is clear and no longer cloudy. Within a few months after receiving an IOL, these extra colors should go away. When you have healed, you will probably need new glasses. SEEK MEDICAL CARE IF:   You have increased bruising around your eye.  You have discomfort not helped by medicine. SEEK IMMEDIATE MEDICAL CARE IF:   You have a fever.  You have a worsening or sudden vision loss.  You have redness, swelling, or increasing pain in the eye.  You have a thick discharge from the eye that had surgery. MAKE SURE YOU:  Understand these instructions.  Will watch your condition.  Will get help right away if you are not doing well or get worse. Document Released: 08/10/2004 Document Revised: 04/15/2011 Document Reviewed: 09/14/2010 [facility] Patient Information 2015 [place], [place]. This information is not intended to replace advice given to you by your health care provider. Make sure you discuss any questions you have with your health care provider.

## 2014-09-04 ENCOUNTER — Other Ambulatory Visit: Payer: Self-pay | Admitting: Internal Medicine

## 2014-09-22 ENCOUNTER — Other Ambulatory Visit: Payer: Self-pay | Admitting: Physician Assistant

## 2014-09-22 DIAGNOSIS — I10 Essential (primary) hypertension: Secondary | ICD-10-CM | POA: Diagnosis not present

## 2014-09-22 DIAGNOSIS — L821 Other seborrheic keratosis: Secondary | ICD-10-CM | POA: Diagnosis not present

## 2014-09-22 DIAGNOSIS — M8589 Other specified disorders of bone density and structure, multiple sites: Secondary | ICD-10-CM | POA: Diagnosis not present

## 2014-09-22 DIAGNOSIS — R110 Nausea: Secondary | ICD-10-CM | POA: Diagnosis not present

## 2014-09-22 DIAGNOSIS — Z6834 Body mass index (BMI) 34.0-34.9, adult: Secondary | ICD-10-CM | POA: Diagnosis not present

## 2014-09-22 DIAGNOSIS — J449 Chronic obstructive pulmonary disease, unspecified: Secondary | ICD-10-CM | POA: Diagnosis not present

## 2014-09-22 DIAGNOSIS — Z9181 History of falling: Secondary | ICD-10-CM | POA: Diagnosis not present

## 2014-09-22 DIAGNOSIS — I509 Heart failure, unspecified: Secondary | ICD-10-CM | POA: Diagnosis not present

## 2014-09-22 DIAGNOSIS — M545 Low back pain: Secondary | ICD-10-CM | POA: Diagnosis not present

## 2014-09-22 DIAGNOSIS — K219 Gastro-esophageal reflux disease without esophagitis: Secondary | ICD-10-CM | POA: Diagnosis not present

## 2014-09-23 DIAGNOSIS — H2511 Age-related nuclear cataract, right eye: Secondary | ICD-10-CM | POA: Diagnosis not present

## 2014-09-27 ENCOUNTER — Encounter: Payer: Self-pay | Admitting: *Deleted

## 2014-09-27 ENCOUNTER — Ambulatory Visit: Payer: Medicare Other

## 2014-09-27 DIAGNOSIS — I509 Heart failure, unspecified: Secondary | ICD-10-CM | POA: Diagnosis not present

## 2014-09-27 DIAGNOSIS — Z87891 Personal history of nicotine dependence: Secondary | ICD-10-CM | POA: Diagnosis not present

## 2014-09-27 DIAGNOSIS — I739 Peripheral vascular disease, unspecified: Secondary | ICD-10-CM | POA: Diagnosis not present

## 2014-09-27 DIAGNOSIS — Z91041 Radiographic dye allergy status: Secondary | ICD-10-CM | POA: Diagnosis not present

## 2014-09-27 DIAGNOSIS — G4730 Sleep apnea, unspecified: Secondary | ICD-10-CM | POA: Diagnosis not present

## 2014-09-27 DIAGNOSIS — J449 Chronic obstructive pulmonary disease, unspecified: Secondary | ICD-10-CM | POA: Diagnosis not present

## 2014-09-27 DIAGNOSIS — K219 Gastro-esophageal reflux disease without esophagitis: Secondary | ICD-10-CM | POA: Diagnosis not present

## 2014-09-27 DIAGNOSIS — Z9981 Dependence on supplemental oxygen: Secondary | ICD-10-CM | POA: Diagnosis not present

## 2014-09-27 DIAGNOSIS — H2511 Age-related nuclear cataract, right eye: Secondary | ICD-10-CM | POA: Diagnosis not present

## 2014-09-27 DIAGNOSIS — E039 Hypothyroidism, unspecified: Secondary | ICD-10-CM | POA: Diagnosis not present

## 2014-09-27 DIAGNOSIS — I10 Essential (primary) hypertension: Secondary | ICD-10-CM | POA: Diagnosis not present

## 2014-09-27 DIAGNOSIS — Z95810 Presence of automatic (implantable) cardiac defibrillator: Secondary | ICD-10-CM | POA: Diagnosis not present

## 2014-09-27 DIAGNOSIS — F329 Major depressive disorder, single episode, unspecified: Secondary | ICD-10-CM | POA: Diagnosis not present

## 2014-09-27 DIAGNOSIS — F319 Bipolar disorder, unspecified: Secondary | ICD-10-CM | POA: Diagnosis not present

## 2014-09-27 DIAGNOSIS — R0602 Shortness of breath: Secondary | ICD-10-CM | POA: Diagnosis not present

## 2014-09-27 DIAGNOSIS — I209 Angina pectoris, unspecified: Secondary | ICD-10-CM | POA: Diagnosis not present

## 2014-09-29 ENCOUNTER — Ambulatory Visit
Admission: RE | Admit: 2014-09-29 | Discharge: 2014-09-29 | Disposition: A | Discharge status: Home / Self Care | Payer: Medicare Other | Source: Ambulatory Visit | Attending: Ophthalmology | Admitting: Ophthalmology

## 2014-09-29 ENCOUNTER — Ambulatory Visit: Payer: Medicare Other | Admitting: Anesthesiology

## 2014-09-29 ENCOUNTER — Encounter: Payer: Self-pay | Admitting: *Deleted

## 2014-09-29 ENCOUNTER — Encounter
Admission: RE | Disposition: A | Discharge status: Home / Self Care | Payer: Self-pay | Source: Ambulatory Visit | Attending: Ophthalmology

## 2014-09-29 DIAGNOSIS — Z87891 Personal history of nicotine dependence: Secondary | ICD-10-CM | POA: Insufficient documentation

## 2014-09-29 DIAGNOSIS — I509 Heart failure, unspecified: Secondary | ICD-10-CM | POA: Insufficient documentation

## 2014-09-29 DIAGNOSIS — Z95810 Presence of automatic (implantable) cardiac defibrillator: Secondary | ICD-10-CM | POA: Insufficient documentation

## 2014-09-29 DIAGNOSIS — G4730 Sleep apnea, unspecified: Secondary | ICD-10-CM | POA: Insufficient documentation

## 2014-09-29 DIAGNOSIS — F319 Bipolar disorder, unspecified: Secondary | ICD-10-CM | POA: Insufficient documentation

## 2014-09-29 DIAGNOSIS — F329 Major depressive disorder, single episode, unspecified: Secondary | ICD-10-CM | POA: Insufficient documentation

## 2014-09-29 DIAGNOSIS — I739 Peripheral vascular disease, unspecified: Secondary | ICD-10-CM | POA: Insufficient documentation

## 2014-09-29 DIAGNOSIS — Z91041 Radiographic dye allergy status: Secondary | ICD-10-CM | POA: Insufficient documentation

## 2014-09-29 DIAGNOSIS — Z9981 Dependence on supplemental oxygen: Secondary | ICD-10-CM | POA: Insufficient documentation

## 2014-09-29 DIAGNOSIS — H2511 Age-related nuclear cataract, right eye: Secondary | ICD-10-CM | POA: Diagnosis not present

## 2014-09-29 DIAGNOSIS — I209 Angina pectoris, unspecified: Secondary | ICD-10-CM | POA: Insufficient documentation

## 2014-09-29 DIAGNOSIS — J449 Chronic obstructive pulmonary disease, unspecified: Secondary | ICD-10-CM | POA: Insufficient documentation

## 2014-09-29 DIAGNOSIS — R0602 Shortness of breath: Secondary | ICD-10-CM | POA: Insufficient documentation

## 2014-09-29 DIAGNOSIS — K219 Gastro-esophageal reflux disease without esophagitis: Secondary | ICD-10-CM | POA: Insufficient documentation

## 2014-09-29 DIAGNOSIS — I10 Essential (primary) hypertension: Secondary | ICD-10-CM | POA: Insufficient documentation

## 2014-09-29 DIAGNOSIS — E039 Hypothyroidism, unspecified: Secondary | ICD-10-CM | POA: Insufficient documentation

## 2014-09-29 HISTORY — PX: CATARACT EXTRACTION W/PHACO: SHX586

## 2014-09-29 SURGERY — PHACOEMULSIFICATION, CATARACT, WITH IOL INSERTION
Anesthesia: General | Site: Eye | Laterality: Right | Wound class: Clean

## 2014-09-29 MED ORDER — FENTANYL CITRATE (PF) 100 MCG/2ML IJ SOLN
INTRAMUSCULAR | Status: DC | PRN
  Administered 2014-09-29: 50 ug via INTRAVENOUS

## 2014-09-29 MED ORDER — LIDOCAINE HCL (PF) 1 % IJ SOLN
INTRAOCULAR | Status: DC | PRN
  Administered 2014-09-29: 4 mL via OPHTHALMIC

## 2014-09-29 MED ORDER — BSS IO SOLN
INTRAOCULAR | Status: DC | PRN
  Administered 2014-09-29: 200 mL via INTRAOCULAR

## 2014-09-29 MED ORDER — SODIUM CHLORIDE 0.9 % IV SOLN
INTRAVENOUS | Status: DC
  Administered 2014-09-29: 09:00:00 via INTRAVENOUS

## 2014-09-29 MED ORDER — TETRACAINE HCL 0.5 % OP SOLN
1.0000 [drp] | OPHTHALMIC | Status: DC | PRN
  Administered 2014-09-29: 1 [drp] via OPHTHALMIC

## 2014-09-29 MED ORDER — CYCLOPENTOLATE HCL 2 % OP SOLN
1.0000 [drp] | OPHTHALMIC | Status: AC | PRN
  Administered 2014-09-29 (×4): 1 [drp] via OPHTHALMIC

## 2014-09-29 MED ORDER — MIDAZOLAM HCL 2 MG/2ML IJ SOLN
INTRAMUSCULAR | Status: DC | PRN
  Administered 2014-09-29 (×3): 1 mg via INTRAVENOUS

## 2014-09-29 MED ORDER — MOXIFLOXACIN HCL 0.5 % OP SOLN
OPHTHALMIC | Status: AC
  Administered 2014-09-29: 1 [drp] via OPHTHALMIC
  Filled 2014-09-29: qty 3

## 2014-09-29 MED ORDER — MOXIFLOXACIN HCL 0.5 % OP SOLN
1.0000 [drp] | OPHTHALMIC | Status: AC | PRN
  Administered 2014-09-29 (×3): 1 [drp] via OPHTHALMIC

## 2014-09-29 MED ORDER — ONDANSETRON HCL 4 MG/2ML IJ SOLN
INTRAMUSCULAR | Status: DC | PRN
  Administered 2014-09-29: 4 mg via INTRAVENOUS

## 2014-09-29 MED ORDER — CYCLOPENTOLATE HCL 2 % OP SOLN
OPHTHALMIC | Status: AC
  Administered 2014-09-29: 1 [drp] via OPHTHALMIC
  Filled 2014-09-29: qty 2

## 2014-09-29 MED ORDER — PHENYLEPHRINE HCL 10 % OP SOLN
OPHTHALMIC | Status: AC
  Administered 2014-09-29: 1 [drp] via OPHTHALMIC
  Filled 2014-09-29: qty 5

## 2014-09-29 MED ORDER — CEFUROXIME OPHTHALMIC INJECTION 1 MG/0.1 ML
INJECTION | OPHTHALMIC | Status: DC | PRN
  Administered 2014-09-29: 0.1 mL via INTRACAMERAL

## 2014-09-29 MED ORDER — TETRACAINE HCL 0.5 % OP SOLN
OPHTHALMIC | Status: AC
  Administered 2014-09-29: 1 [drp] via OPHTHALMIC
  Filled 2014-09-29: qty 2

## 2014-09-29 MED ORDER — PHENYLEPHRINE HCL 10 % OP SOLN
1.0000 [drp] | OPHTHALMIC | Status: AC | PRN
  Administered 2014-09-29 (×4): 1 [drp] via OPHTHALMIC

## 2014-09-29 MED ORDER — MOXIFLOXACIN HCL 0.5 % OP SOLN
OPHTHALMIC | Status: DC | PRN
Start: 2014-09-29 — End: 2014-09-29
  Administered 2014-09-29: 1 [drp] via OPHTHALMIC

## 2014-09-29 MED ORDER — NA HYALUR & NA CHOND-NA HYALUR 0.4-0.35 ML IO KIT
PACK | INTRAOCULAR | Status: DC | PRN
  Administered 2014-09-29: .75 mL via INTRAOCULAR

## 2014-09-29 MED ORDER — NEOMYCIN-POLYMYXIN-DEXAMETH 3.5-10000-0.1 OP OINT
TOPICAL_OINTMENT | OPHTHALMIC | Status: DC | PRN
  Administered 2014-09-29: 1 via OPHTHALMIC

## 2014-09-29 SURGICAL SUPPLY — 24 items
CANNULA ANT/CHMB 27G (MISCELLANEOUS) ×1 IMPLANT
CANNULA ANT/CHMB 27GA (MISCELLANEOUS) ×3 IMPLANT
CUP MEDICINE 2OZ PLAST GRAD ST (MISCELLANEOUS) ×3 IMPLANT
GLOVE BIO SURGEON STRL SZ7 (GLOVE) ×3 IMPLANT
GLOVE SURG LX 6.5 MICRO (GLOVE) ×4
GLOVE SURG LX STRL 6.5 MICRO (GLOVE) ×2 IMPLANT
GOWN STRL REUS W/ TWL LRG LVL3 (GOWN DISPOSABLE) ×2 IMPLANT
GOWN STRL REUS W/TWL LRG LVL3 (GOWN DISPOSABLE) ×6
LENS IOL ACRSF IQ PC 21.0 (Intraocular Lens) IMPLANT
LENS IOL ACRYSOF IQ POST 21.0 (Intraocular Lens) ×3 IMPLANT
NDL FILTER BLUNT 18X1 1/2 (NEEDLE) ×1 IMPLANT
NEEDLE FILTER BLUNT 18X 1/2SAF (NEEDLE) ×2
NEEDLE FILTER BLUNT 18X1 1/2 (NEEDLE) ×1 IMPLANT
PACK CATARACT (MISCELLANEOUS) ×3 IMPLANT
PACK CATARACT BRASINGTON LX (MISCELLANEOUS) ×3 IMPLANT
PACK EYE AFTER SURG (MISCELLANEOUS) ×3 IMPLANT
SOL BSS BAG (MISCELLANEOUS) ×3
SOL PREP PVP 2OZ (MISCELLANEOUS) ×3
SOLUTION BSS BAG (MISCELLANEOUS) ×1 IMPLANT
SOLUTION PREP PVP 2OZ (MISCELLANEOUS) ×1 IMPLANT
SYR 3ML LL SCALE MARK (SYRINGE) ×6 IMPLANT
SYR TB 1ML 27GX1/2 LL (SYRINGE) ×3 IMPLANT
WATER STERILE IRR 1000ML POUR (IV SOLUTION) ×3 IMPLANT
WIPE NON LINTING 3.25X3.25 (MISCELLANEOUS) ×3 IMPLANT

## 2014-09-29 NOTE — Discharge Instructions (Signed)
POST OPERATIVE INSTRUCTIONS  DAY OF CATARACT SURGERY Your surgery went well.  Today, take it easy and protect the eye.  Dont bend over at the waist. Dont lift objects heavier than a jug of milk (10lbs). Dont let anything get in the eye other than the drops we give you. Keep the eye shield on at all times except to put in the drops. You may have a mild headache, soreness or scratchy sensation after surgery. EYE DROPS AFTER SURGERY          Durezol Steroid (SHAKE WELL) Ilevro Anti-inflammatory Vigamox Antibiotic       2 times a day Once daily 4 times a day      Wait 5 minutes between drops  If you have questions, call [facility] We will see you tomorrow in the eye clinic. AMBULATORY SURGERY  DISCHARGE INSTRUCTIONS   1) The drugs that you were given will stay in your system until tomorrow so for the next 24 hours you should not:  A) Drive an automobile B) Make any legal decisions C) Drink any alcoholic beverage   2) You may resume regular meals tomorrow.  Today it is better to start with liquids and gradually work up to solid foods.  You may eat anything you prefer, but it is better to start with liquids, then soup and crackers, and gradually work up to solid foods.   3) Please notify your doctor immediately if you have any unusual bleeding, trouble breathing, redness and pain at the surgery site, drainage, fever, or pain not relieved by medication.    4) Additional Instructions:        Please contact your physician with any problems or Same Day Surgery at [phone], Monday through Friday 6 am to 4 pm, or [place] at [facility] number at [phone].

## 2014-09-29 NOTE — Anesthesia Postprocedure Evaluation (Signed)
  Anesthesia Post-op Note  Patient: Delice Bison  Procedure(s) Performed: Procedure(s) with comments: CATARACT EXTRACTION PHACO AND INTRAOCULAR LENS PLACEMENT (IOC) (Right) - [place]: 00:52.7 AP%: 12.8 CDE: 6.76 [id number] H  Anesthesia type:General  Patient location: PhaseII  Post pain: Pain level controlled  Post assessment: Post-op Vital signs reviewed, Patient's Cardiovascular Status Stable, Respiratory Function Stable, Patent Airway and No signs of Nausea or vomiting  Post vital signs: Reviewed and stable  Last Vitals:  Filed Vitals:   09/29/14 1108  BP: 142/120  Pulse:   Temp: 36.7 C  Resp: 16    Level of consciousness: awake, alert  and patient cooperative  Complications: No apparent anesthesia complications

## 2014-09-29 NOTE — Anesthesia Preprocedure Evaluation (Addendum)
Anesthesia Evaluation  Patient identified by MRN, date of birth, ID band Patient awake    Reviewed: Allergy & Precautions, NPO status , Patient's Chart, lab work & pertinent test results  History of Anesthesia Complications Negative for: history of anesthetic complications  Airway Mallampati: II  TM Distance: >3 FB Neck ROM: Full    Dental  (+) Upper Dentures, Lower Dentures   Pulmonary shortness of breath and Long-Term Oxygen Therapy, sleep apnea (not using CPAP) , COPD (last used inhaler last week, on 3 L O2 continuosly) COPD inhaler and oxygen dependent, former smoker,          Cardiovascular hypertension, Pt. on medications and Pt. on home beta blockers + angina with exertion + Peripheral Vascular Disease and +CHF + dysrhythmias + Cardiac Defibrillator     Neuro/Psych Anxiety Depression    GI/Hepatic GERD-  Medicated,  Endo/Other  Hypothyroidism   Renal/GU      Musculoskeletal   Abdominal   Peds  Hematology   Anesthesia Other Findings   Reproductive/Obstetrics                            Anesthesia Physical Anesthesia Plan  ASA: III  Anesthesia Plan: MAC   Post-op Pain Management:    Induction: Intravenous  Airway Management Planned: Nasal Cannula  Additional Equipment:   Intra-op Plan:   Post-operative Plan:   Informed Consent: I have reviewed the patients History and Physical, chart, labs and discussed the procedure including the risks, benefits and alternatives for the proposed anesthesia with the patient or authorized representative who has indicated his/her understanding and acceptance.     Plan Discussed with:   Anesthesia Plan Comments:        Anesthesia Quick Evaluation

## 2014-09-29 NOTE — Anesthesia Procedure Notes (Signed)
Procedure Name: MAC Date/Time: 09/29/2014 10:28 AM Performed by: Doreen Salvage Pre-anesthesia Checklist: Patient identified, Emergency Drugs available, Suction available and Patient being monitored Patient Re-evaluated:Patient Re-evaluated prior to inductionOxygen Delivery Method: Nasal cannula

## 2014-09-29 NOTE — Transfer of Care (Signed)
Immediate Anesthesia Transfer of Care Note  Patient: CELSA NORDAHL  Procedure(s) Performed: Procedure(s) with comments: CATARACT EXTRACTION PHACO AND INTRAOCULAR LENS PLACEMENT (IOC) (Right) - [place]: 00:52.7 AP%: 12.8 CDE: 6.76 [id number] H  Patient Location: PHASE II  Anesthesia Type:MAC  Level of Consciousness: Awake, Alert, Oriented  Airway & Oxygen Therapy: Patient Spontanous Breathing and Patient on room air   Post-op Assessment: Report given to RN and Post -op Vital signs reviewed and stable  Post vital signs: Reviewed and stable  Last Vitals:  Filed Vitals:   09/29/14 1108  BP: 142/120  Pulse:   Temp: 36.7 C  Resp: 16    Complications: No apparent anesthesia complications

## 2014-09-29 NOTE — Op Note (Signed)
  09/29/2014  PRE-OP DIAGNOSIS: Cataract (ICD-10 H25.11) Nuclear sclerotic catarct, RIGHT EYE  Post operative diagnosis: Cataract (ICD-10 H25.11) Nuclear sclerotic cataract, RIGHT EYE  Procedure: Phacoemulsification with introcular lens [id number])   SURGEON: Surgeon(s) and Role:    * Lyla Glassing, MD - Primary  ANESTHESIA: Topical   ESTIMATED BLOOD LOSS: MINIMAL  COMPLICATIONS: None  OPERATIVE DESCRIPTION:   Therapeutic options were discussed with the patient preoperatively, including a discussion of risks and benefits of surgery.  Informed consent was obtained. A dilated fundus exam was performed within 6 months.   The patient was premedicated and brought to the operating room and placed on the operating table in the supine position.  Topical tetracaine was instilled.  After adequate anesthesia, the patient was prepped and draped in the usual fashion.  A wire lid speculum was inserted and the microscope was positioned.  A sideport was used to create a paracentesis site and a mixture of preservative-free lidocaine, BSS, and epinephrine was was instilled into the anterior chamber, followed by viscoelastic.  A clear corneal incision was created using a keratome blade.  Capsulorrhexis was then performed.  In situ phacoemulsification was performed.  Cortical material was removed with the irrigation-aspiration unit.  Viscoelastic was instilled to open the capsular bag.  A posterior chamber intraocular lens, model 21.0 diopters, was inserted and positioned.  Irrigation-aspiration was used to remove all viscoelastic. Intracameral cefuroxime was injected into the eye. Wounds were checked for leakage and confirmed to be secure.  Lid speculum was removed and a shield was placed over the eye.  Patient was returned to the recovery room in stable condition. IMPLANTS:   Implant Name Type Inv. Item Serial No. Manufacturer Lot No. LRB No. Used  IMPLANT LENS - [id number] Intraocular Lens IMPLANT  LENS [id number] ALCON   Right 1     Postoperative care and discharge medication counseling was discussed with the patient or the parents prior to discharge

## 2014-09-29 NOTE — H&P (Signed)
The history and physical was faxed to the hospital. The history and physical was reviewed by me and no changes have occurred.   

## 2014-10-03 ENCOUNTER — Ambulatory Visit
Admission: RE | Admit: 2014-10-03 | Discharge: 2014-10-03 | Disposition: A | Discharge status: Home / Self Care | Payer: Medicare Other | Source: Ambulatory Visit | Attending: Physician Assistant | Admitting: Physician Assistant

## 2014-10-03 DIAGNOSIS — Z780 Asymptomatic menopausal state: Secondary | ICD-10-CM | POA: Diagnosis not present

## 2014-10-03 DIAGNOSIS — M810 Age-related osteoporosis without current pathological fracture: Secondary | ICD-10-CM | POA: Insufficient documentation

## 2014-10-03 DIAGNOSIS — M8589 Other specified disorders of bone density and structure, multiple sites: Secondary | ICD-10-CM | POA: Diagnosis present

## 2014-10-11 ENCOUNTER — Encounter: Payer: Self-pay | Admitting: *Deleted

## 2014-10-13 ENCOUNTER — Ambulatory Visit (INDEPENDENT_AMBULATORY_CARE_PROVIDER_SITE_OTHER): Payer: Medicare Other | Admitting: Internal Medicine

## 2014-10-13 ENCOUNTER — Encounter: Payer: Self-pay | Admitting: Internal Medicine

## 2014-10-13 VITALS — BP 128/64 | HR 79 | Ht 63.0 in | Wt 197.6 lb

## 2014-10-13 DIAGNOSIS — I429 Cardiomyopathy, unspecified: Secondary | ICD-10-CM | POA: Diagnosis not present

## 2014-10-13 DIAGNOSIS — Z95810 Presence of automatic (implantable) cardiac defibrillator: Secondary | ICD-10-CM

## 2014-10-13 DIAGNOSIS — I428 Other cardiomyopathies: Secondary | ICD-10-CM

## 2014-10-13 DIAGNOSIS — I5022 Chronic systolic (congestive) heart failure: Secondary | ICD-10-CM | POA: Diagnosis not present

## 2014-10-13 NOTE — Progress Notes (Signed)
Patient Care Team: Cyndi Bender, PA-C as PCP - General (Physician Assistant)   HPI  Stacy Grimes is a 64 y.o. 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   Stable on current O2 therapy.  She has had a rough few months with ruptured diverticulitis and post operative pneumonia. She remains quite short of breath.  No chest pain  Chronic mild edema  Catheterization 2010 demonstrated an ejection fraction of 15-20% without obstructive coronary disease    Past Medical History  Diagnosis Date  . Non-ischemic cardiomyopathy   . Chronic systolic heart failure   . Hypertension   . COPD (chronic obstructive pulmonary disease)   . Deafness     left ear  . AAA (abdominal aortic aneurysm)   . Hypothyroidism   . Anxiety   . Depression   . GERD (gastroesophageal reflux disease)   . Biventricular ICD  [employer]   . Sleep apnea   . Automatic implantable cardioverter-defibrillator in situ   . Oxygen dependent   . Shortness of breath dyspnea   . Dysrhythmia   . Dizziness   . Anginal pain   . Palpitations   . Wheezing   . Arthritis   . Fracture     history of spinal fracture  . CHF (congestive heart failure)     Past Surgical History  Procedure Laterality Date  . Cholecystectomy    . Bladder surgery    . Knee surgery Right   . Wrist surgery Right   . Middle ear surgery    . Cardiac catheterization    . Cardiac defibrillator placement  4 yrs ago    pacemaker  . Abdominal hysterectomy    . Esophagogastroduodenoscopy N/A 07/06/2012    Procedure: ESOPHAGOGASTRODUODENOSCOPY (EGD);  Surgeon: Lafayette Dragon, MD;  Location: [facility];  Service: Endoscopy;  Laterality: N/A;  . Savory dilation N/A 07/06/2012    Procedure: SAVORY DILATION;  Surgeon: Lafayette Dragon, MD;  Location: [facility];  Service: Endoscopy;  Laterality: N/A;  . Colonoscopy    . Colonoscopy N/A 08/31/2012   Procedure: COLONOSCOPY;  Surgeon: Inda Castle, MD;  Location: [facility];  Service: Endoscopy;  Laterality: N/A;  . Colon surgery    . Cataract extraction w/phaco Left 09/01/2014    Procedure: CATARACT EXTRACTION PHACO AND INTRAOCULAR LENS PLACEMENT (IOC);  Surgeon: Lyla Glassing, MD;  Location: [facility] OR[facility];  Service: Ophthalmology;  Laterality: Left;  [place]: 1:12.1   . Cataract extraction w/phaco Right 09/29/2014    Procedure: CATARACT EXTRACTION PHACO AND INTRAOCULAR LENS PLACEMENT (IOC);  Surgeon: Lyla Glassing, MD;  Location: [facility] OR[facility];  Service: Ophthalmology;  Laterality: Right;  [place]: 00:52.7     Current Outpatient Prescriptions  Medication Sig Dispense Refill  . albuterol (PROVENTIL HFA) 108 (90 BASE) MCG/ACT inhaler Inhale 2 puffs into the lungs every 6 (six) hours as needed.     Marland Kitchen allopurinol (ZYLOPRIM) 300 MG tablet Take 450 mg by mouth daily.    Marland Kitchen aspirin 81 MG EC tablet Take 81 mg by mouth daily.      . carvedilol (COREG) 25 MG tablet Take 1 tablet (25 mg total) by mouth as directed. Take 1 tablet in the morning and 1/2 tablet in the evening 45 tablet 11  . cyclobenzaprine (FLEXERIL) 10 MG tablet Take 10 mg by mouth 3 (three) times daily as needed for muscle spasms.     . digoxin (LANOXIN) 0.125 MG  tablet Take 125 mcg by mouth every morning.     . docusate sodium (COLACE) 100 MG capsule One to three tablets daily    . furosemide (LASIX) 40 MG tablet TAKE ONE TABLET BY MOUTH TWICE DAILY 60 tablet 1  . HYDROcodone-acetaminophen (NORCO) 10-325 MG per tablet Take 1 tablet by mouth every 6 (six) hours as needed.    . indomethacin (INDOCIN) 50 MG capsule Take 50 mg by mouth 3 (three) times daily with meals.    Marland Kitchen ipratropium-albuterol (DUONEB) 0.5-2.5 (3) MG/3ML SOLN Take 3 mLs by nebulization every 4 (four) hours as needed (for wheezing).     Marland Kitchen levothyroxine (SYNTHROID, LEVOTHROID) 88 MCG tablet Take 112 mcg by mouth every morning.     . loratadine (CLARITIN) 10 MG tablet Take 10 mg by  mouth daily.    Marland Kitchen losartan (COZAAR) 50 MG tablet Take 25 mg by mouth daily.    . Omega-3 Fatty Acids (FISH OIL) 1000 MG CAPS One capsule by mouth twice daily    . omeprazole (PRILOSEC) 20 MG capsule Take 20 mg by mouth every morning.     Marland Kitchen oxycodone (OXY-IR) 5 MG capsule Take 5 mg by mouth every 4 (four) hours as needed.    . pravastatin (PRAVACHOL) 20 MG tablet Take 20 mg by mouth daily.    . pregabalin (LYRICA) 150 MG capsule Take 150 mg by mouth 2 (two) times daily.    . roflumilast (DALIRESP) 500 MCG TABS tablet Take 500 mcg by mouth daily.    Marland Kitchen tiotropium (SPIRIVA) 18 MCG inhalation capsule Place 18 mcg into inhaler and inhale daily.     Marland Kitchen tiZANidine (ZANAFLEX) 4 MG tablet 4 mg. One tablet twice daily as needed for back/ muscle pain    . traMADol (ULTRAM) 50 MG tablet Take by mouth every 6 (six) hours as needed.    . venlafaxine (EFFEXOR-XR) 37.5 MG 24 hr capsule Take 75 mg by mouth daily.     . vitamin B-12 (CYANOCOBALAMIN) 1000 MCG tablet 1,000 mcg. One tablet by mouth twice daily    . zolpidem (AMBIEN) 5 MG tablet Take 5 mg by mouth at bedtime as needed for sleep.     No current facility-administered medications for this visit.    Allergies  Allergen Reactions  . Codeine Other (See Comments)  . Contrast Media [Iodinated Diagnostic Agents]     Review of Systems negative except from HPI and PMH  Physical Exam BP 128/64 mmHg  Pulse 79  Ht '5\' 3"'$  (1.6 m)  Wt 197 lb 9.6 oz (89.631 kg)  BMI 35.01 kg/m2 Well developed and well nourished wearing oxygen HENT normal-largely edentulous E scleral and icterus clear Neck Supple JVP flat; carotids brisk and full Wheezes anteriorly and posteriorly bilaterally  *Regular rate and rhythm, no murmurs gallops or rub Soft with active bowel sounds right upper quadrant tenderness No clubbing cyanosis Trace Edema Alert and oriented, grossly normal motor and sensory function Marked limitations of shoulder activity bilaterally Skin Warm and  Dry  ECG demonstrates P. synchronous pacing with  Intervals 16/13/42 Pre CRT implant QRS duration 180 ms   Assessment and  Plan Nonischemic cardiomyopathy  Congestive heart failure-chronic-systolic  Implantable defibrillator-[employer]-CRT The patient's device was interrogated and the information was fully reviewed.  The device was reprogrammed to shorten AV delay with exertion  Oxygen-dependent COPD   She is quite stable. We await the echocardiogram scheduled for next week. She is relatively euvolemic. Will little bit hard to assess in the  setting of her chronic oxygen dependence.  We will continue her on beta blockers, ARB and digoxin.  She will need digoxin level.  We will need to get labs from her PCP at her last potassium are record is 3.0. Renal function was normal.  We have been in contact with her primary care physician's office. Blood has not been drawn since January. She is to get an echo the next week. We will ask them to try and draw the blood work at that time.

## 2014-10-13 NOTE — Patient Instructions (Signed)
Medication Instructions: - no changes  Labwork: - none  Procedures/Testing: - none  Follow-Up: - Remote monitoring is used to monitor your Pacemaker of ICD from home. This monitoring reduces the number of office visits required to check your device to one time per year. It allows [place] to keep an eye on the functioning of your device to ensure it is working properly. You are scheduled for a device check from home on 01/12/15. You may send your transmission at any time that day. If you have a wireless device, the transmission will be sent automatically. After your physician reviews your transmission, you will receive a postcard with your next transmission date.  - Your physician wants you to follow-up in: 1 year with Dr. Caryl Comes in the [place] office. You will receive a reminder letter in the mail two months in advance. If you don't receive a letter, please call our office to schedule the follow-up appointment.  Any Additional Special Instructions Will Be Listed Below (If Applicable). - none

## 2014-10-16 ENCOUNTER — Other Ambulatory Visit: Payer: Self-pay | Admitting: Internal Medicine

## 2014-10-17 LAB — CUP PACEART INCLINIC DEVICE CHECK
Brady Statistic RV Percent Paced: 99 %
Date Time Interrogation Session: 20160908151159
HIGH POWER IMPEDANCE MEASURED VALUE: 50.9939
Lead Channel Impedance Value: 362.5 Ohm
Lead Channel Impedance Value: 612.5 Ohm
Lead Channel Pacing Threshold Amplitude: 0.75 V
Lead Channel Pacing Threshold Amplitude: 0.75 V
Lead Channel Pacing Threshold Amplitude: 0.75 V
Lead Channel Pacing Threshold Amplitude: 1 V
Lead Channel Pacing Threshold Pulse Width: 0.5 ms
Lead Channel Sensing Intrinsic Amplitude: 5 mV
Lead Channel Setting Pacing Amplitude: 2.5 V
Lead Channel Setting Pacing Pulse Width: 0.8 ms
MDC IDC MSMT BATTERY REMAINING LONGEVITY: 22.8 mo
MDC IDC MSMT LEADCHNL LV PACING THRESHOLD PULSEWIDTH: 0.5 ms
MDC IDC MSMT LEADCHNL RA PACING THRESHOLD PULSEWIDTH: 0.5 ms
MDC IDC MSMT LEADCHNL RV IMPEDANCE VALUE: 550 Ohm
MDC IDC MSMT LEADCHNL RV PACING THRESHOLD PULSEWIDTH: 0.8 ms
MDC IDC MSMT LEADCHNL RV SENSING INTR AMPL: 12 mV
MDC IDC PG SERIAL: 605521
MDC IDC SET LEADCHNL LV PACING AMPLITUDE: 2.5 V
MDC IDC SET LEADCHNL LV PACING PULSEWIDTH: 0.5 ms
MDC IDC SET LEADCHNL RA PACING AMPLITUDE: 2 V
MDC IDC SET LEADCHNL RV SENSING SENSITIVITY: 0.5 mV
MDC IDC SET ZONE DETECTION INTERVAL: 250 ms
MDC IDC STAT BRADY RA PERCENT PACED: 6.1 %
Zone Setting Detection Interval: 300 ms

## 2014-10-20 DIAGNOSIS — I509 Heart failure, unspecified: Secondary | ICD-10-CM | POA: Diagnosis not present

## 2014-10-24 ENCOUNTER — Encounter: Payer: Self-pay | Admitting: Internal Medicine

## 2015-01-04 DIAGNOSIS — E7800 Pure hypercholesterolemia, unspecified: Secondary | ICD-10-CM | POA: Diagnosis not present

## 2015-01-04 DIAGNOSIS — Z6835 Body mass index (BMI) 35.0-35.9, adult: Secondary | ICD-10-CM | POA: Diagnosis not present

## 2015-01-04 DIAGNOSIS — I509 Heart failure, unspecified: Secondary | ICD-10-CM | POA: Diagnosis not present

## 2015-01-04 DIAGNOSIS — J449 Chronic obstructive pulmonary disease, unspecified: Secondary | ICD-10-CM | POA: Diagnosis not present

## 2015-01-04 DIAGNOSIS — F418 Other specified anxiety disorders: Secondary | ICD-10-CM | POA: Diagnosis not present

## 2015-01-04 DIAGNOSIS — E039 Hypothyroidism, unspecified: Secondary | ICD-10-CM | POA: Diagnosis not present

## 2015-01-04 DIAGNOSIS — M545 Low back pain: Secondary | ICD-10-CM | POA: Diagnosis not present

## 2015-01-04 DIAGNOSIS — R700 Elevated erythrocyte sedimentation rate: Secondary | ICD-10-CM | POA: Diagnosis not present

## 2015-01-04 DIAGNOSIS — Z23 Encounter for immunization: Secondary | ICD-10-CM | POA: Diagnosis not present

## 2015-01-04 DIAGNOSIS — R413 Other amnesia: Secondary | ICD-10-CM | POA: Diagnosis not present

## 2015-01-04 DIAGNOSIS — I10 Essential (primary) hypertension: Secondary | ICD-10-CM | POA: Diagnosis not present

## 2015-01-04 DIAGNOSIS — Z79899 Other long term (current) drug therapy: Secondary | ICD-10-CM | POA: Diagnosis not present

## 2015-01-08 ENCOUNTER — Other Ambulatory Visit: Payer: Self-pay | Admitting: Physician Assistant

## 2015-01-08 ENCOUNTER — Ambulatory Visit
Admission: AD | Admit: 2015-01-08 | Discharge: 2015-01-08 | Disposition: A | Discharge status: Home / Self Care | Payer: Medicare Other | Source: Ambulatory Visit | Attending: Pediatrics | Admitting: Pediatrics

## 2015-01-08 ENCOUNTER — Ambulatory Visit
Admission: RE | Admit: 2015-01-08 | Discharge: 2015-01-08 | Disposition: A | Discharge status: Home / Self Care | Payer: Medicare Other | Source: Ambulatory Visit | Attending: Physician Assistant | Admitting: Physician Assistant

## 2015-01-08 DIAGNOSIS — J449 Chronic obstructive pulmonary disease, unspecified: Secondary | ICD-10-CM

## 2015-01-08 DIAGNOSIS — R700 Elevated erythrocyte sedimentation rate: Secondary | ICD-10-CM | POA: Diagnosis not present

## 2015-01-11 DIAGNOSIS — L299 Pruritus, unspecified: Secondary | ICD-10-CM | POA: Diagnosis not present

## 2015-01-11 DIAGNOSIS — R700 Elevated erythrocyte sedimentation rate: Secondary | ICD-10-CM | POA: Diagnosis not present

## 2015-01-11 DIAGNOSIS — J441 Chronic obstructive pulmonary disease with (acute) exacerbation: Secondary | ICD-10-CM | POA: Diagnosis not present

## 2015-01-12 ENCOUNTER — Ambulatory Visit (INDEPENDENT_AMBULATORY_CARE_PROVIDER_SITE_OTHER): Payer: Medicare Other | Admitting: *Deleted

## 2015-01-12 DIAGNOSIS — I429 Cardiomyopathy, unspecified: Secondary | ICD-10-CM

## 2015-01-12 DIAGNOSIS — I428 Other cardiomyopathies: Secondary | ICD-10-CM

## 2015-01-13 NOTE — Progress Notes (Signed)
Remote ICD transmission.   

## 2015-01-20 LAB — CUP PACEART REMOTE DEVICE CHECK
Battery Remaining Longevity: 20 mo
Battery Remaining Percentage: 27 %
Battery Voltage: 2.83 V
Brady Statistic AP VP Percent: 3.8 %
Brady Statistic AS VP Percent: 95 %
Brady Statistic RA Percent Paced: 3.8 %
Date Time Interrogation Session: 20161208074430
HIGH POWER IMPEDANCE MEASURED VALUE: 49 Ohm
Implantable Lead Implant Date: 20110309
Implantable Lead Implant Date: 20110309
Implantable Lead Location: 753858
Implantable Lead Location: 753860
Implantable Lead Model: 7121
Lead Channel Impedance Value: 580 Ohm
Lead Channel Impedance Value: 580 Ohm
Lead Channel Pacing Threshold Amplitude: 1 V
Lead Channel Pacing Threshold Pulse Width: 0.5 ms
Lead Channel Sensing Intrinsic Amplitude: 12 mV
Lead Channel Setting Pacing Amplitude: 2 V
Lead Channel Setting Pacing Amplitude: 2.5 V
Lead Channel Setting Pacing Pulse Width: 0.5 ms
MDC IDC LEAD IMPLANT DT: 20110309
MDC IDC LEAD LOCATION: 753859
MDC IDC MSMT LEADCHNL LV PACING THRESHOLD AMPLITUDE: 0.75 V
MDC IDC MSMT LEADCHNL LV PACING THRESHOLD PULSEWIDTH: 0.5 ms
MDC IDC MSMT LEADCHNL RA IMPEDANCE VALUE: 350 Ohm
MDC IDC MSMT LEADCHNL RA SENSING INTR AMPL: 5 mV
MDC IDC MSMT LEADCHNL RV PACING THRESHOLD AMPLITUDE: 0.75 V
MDC IDC MSMT LEADCHNL RV PACING THRESHOLD PULSEWIDTH: 0.8 ms
MDC IDC SET LEADCHNL RV PACING AMPLITUDE: 2.5 V
MDC IDC SET LEADCHNL RV PACING PULSEWIDTH: 0.8 ms
MDC IDC SET LEADCHNL RV SENSING SENSITIVITY: 0.5 mV
MDC IDC STAT BRADY AP VS PERCENT: 1 %
MDC IDC STAT BRADY AS VS PERCENT: 1 %
Pulse Gen Serial Number: 605521

## 2015-01-25 ENCOUNTER — Encounter: Payer: Self-pay | Admitting: Cardiology

## 2015-02-07 DIAGNOSIS — Z6835 Body mass index (BMI) 35.0-35.9, adult: Secondary | ICD-10-CM | POA: Diagnosis not present

## 2015-02-07 DIAGNOSIS — G4700 Insomnia, unspecified: Secondary | ICD-10-CM | POA: Diagnosis not present

## 2015-02-07 DIAGNOSIS — R413 Other amnesia: Secondary | ICD-10-CM | POA: Diagnosis not present

## 2015-02-07 DIAGNOSIS — R700 Elevated erythrocyte sedimentation rate: Secondary | ICD-10-CM | POA: Diagnosis not present

## 2015-02-07 DIAGNOSIS — E669 Obesity, unspecified: Secondary | ICD-10-CM | POA: Diagnosis not present

## 2015-02-07 DIAGNOSIS — M545 Low back pain: Secondary | ICD-10-CM | POA: Diagnosis not present

## 2015-02-08 ENCOUNTER — Other Ambulatory Visit: Payer: Self-pay | Admitting: Physician Assistant

## 2015-02-08 DIAGNOSIS — R413 Other amnesia: Secondary | ICD-10-CM

## 2015-02-13 ENCOUNTER — Ambulatory Visit: Admission: RE | Admit: 2015-02-13 | Payer: Medicare Other | Source: Ambulatory Visit

## 2015-02-17 DIAGNOSIS — Z803 Family history of malignant neoplasm of breast: Secondary | ICD-10-CM | POA: Diagnosis not present

## 2015-02-17 DIAGNOSIS — Z1231 Encounter for screening mammogram for malignant neoplasm of breast: Secondary | ICD-10-CM | POA: Diagnosis not present

## 2015-02-20 ENCOUNTER — Ambulatory Visit
Admission: RE | Admit: 2015-02-20 | Discharge: 2015-02-20 | Disposition: A | Discharge status: Home / Self Care | Payer: Medicare Other | Source: Ambulatory Visit | Attending: Physician Assistant | Admitting: Physician Assistant

## 2015-02-20 ENCOUNTER — Other Ambulatory Visit: Payer: Self-pay | Admitting: Physician Assistant

## 2015-02-20 DIAGNOSIS — R51 Headache: Secondary | ICD-10-CM | POA: Diagnosis not present

## 2015-02-20 DIAGNOSIS — R413 Other amnesia: Secondary | ICD-10-CM

## 2015-03-20 ENCOUNTER — Telehealth: Payer: Self-pay | Admitting: Cardiology

## 2015-03-20 NOTE — Telephone Encounter (Signed)
Spoke w/ pt husband and requested that he send a remote transmission w/ home monitor b/c pt home monitor has not updated in at least 8 days. He verbalized understanding.

## 2015-03-23 ENCOUNTER — Ambulatory Visit (INDEPENDENT_AMBULATORY_CARE_PROVIDER_SITE_OTHER): Payer: Medicare Other | Admitting: Internal Medicine

## 2015-03-23 ENCOUNTER — Encounter: Payer: Self-pay | Admitting: Internal Medicine

## 2015-03-23 VITALS — BP 100/70 | HR 77 | Ht 63.0 in | Wt 200.8 lb

## 2015-03-23 DIAGNOSIS — Z95810 Presence of automatic (implantable) cardiac defibrillator: Secondary | ICD-10-CM

## 2015-03-23 DIAGNOSIS — I428 Other cardiomyopathies: Secondary | ICD-10-CM | POA: Diagnosis not present

## 2015-03-23 DIAGNOSIS — I5022 Chronic systolic (congestive) heart failure: Secondary | ICD-10-CM

## 2015-03-23 DIAGNOSIS — I429 Cardiomyopathy, unspecified: Secondary | ICD-10-CM | POA: Diagnosis not present

## 2015-03-23 LAB — CUP PACEART INCLINIC DEVICE CHECK
Battery Remaining Longevity: 20.4
Brady Statistic RA Percent Paced: 2.9 %
Date Time Interrogation Session: 20170216170710
HIGH POWER IMPEDANCE MEASURED VALUE: 58 Ohm
Implantable Lead Implant Date: 20110309
Implantable Lead Location: 753859
Implantable Lead Model: 7121
Lead Channel Pacing Threshold Amplitude: 0.75 V
Lead Channel Pacing Threshold Amplitude: 1 V
Lead Channel Pacing Threshold Amplitude: 1 V
Lead Channel Pacing Threshold Pulse Width: 0.5 ms
Lead Channel Pacing Threshold Pulse Width: 0.5 ms
Lead Channel Pacing Threshold Pulse Width: 0.5 ms
Lead Channel Pacing Threshold Pulse Width: 0.8 ms
Lead Channel Pacing Threshold Pulse Width: 0.8 ms
Lead Channel Sensing Intrinsic Amplitude: 12 mV
Lead Channel Sensing Intrinsic Amplitude: 5 mV
Lead Channel Setting Pacing Amplitude: 2.5 V
Lead Channel Setting Pacing Amplitude: 2.5 V
Lead Channel Setting Pacing Pulse Width: 0.5 ms
MDC IDC LEAD IMPLANT DT: 20110309
MDC IDC LEAD IMPLANT DT: 20110309
MDC IDC LEAD LOCATION: 753858
MDC IDC LEAD LOCATION: 753860
MDC IDC MSMT LEADCHNL LV IMPEDANCE VALUE: 687.5 Ohm
MDC IDC MSMT LEADCHNL LV PACING THRESHOLD AMPLITUDE: 0.75 V
MDC IDC MSMT LEADCHNL RA IMPEDANCE VALUE: 387.5 Ohm
MDC IDC MSMT LEADCHNL RA PACING THRESHOLD AMPLITUDE: 1 V
MDC IDC MSMT LEADCHNL RA PACING THRESHOLD PULSEWIDTH: 0.5 ms
MDC IDC MSMT LEADCHNL RV IMPEDANCE VALUE: 712.5 Ohm
MDC IDC MSMT LEADCHNL RV PACING THRESHOLD AMPLITUDE: 1 V
MDC IDC SET LEADCHNL RA PACING AMPLITUDE: 2.125
MDC IDC SET LEADCHNL RV PACING PULSEWIDTH: 0.8 ms
MDC IDC SET LEADCHNL RV SENSING SENSITIVITY: 0.5 mV
MDC IDC STAT BRADY RV PERCENT PACED: 99.11 %
Pulse Gen Serial Number: 605521

## 2015-03-23 NOTE — Progress Notes (Signed)
Patient Care Team: Cyndi Bender, PA-C as PCP - General (Physician Assistant)   HPI  Stacy Grimes is a 65 y.o. 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   Stable on current O2 therapy.  She remains quite short of breath.  No chest pain  Chronic mild edema  Catheterization 2010 demonstrated an ejection fraction of 15-20% without obstructive coronary disease    Past Medical History  Diagnosis Date  . Non-ischemic cardiomyopathy ([place])   . Chronic systolic heart failure ([place])   . Hypertension   . COPD (chronic obstructive pulmonary disease) ([place])   . Deafness     left ear  . AAA (abdominal aortic aneurysm) ([place])   . Hypothyroidism   . Anxiety   . Depression   . GERD (gastroesophageal reflux disease)   . Biventricular ICD  [employer]   . Sleep apnea   . Automatic implantable cardioverter-defibrillator in situ   . Oxygen dependent   . Shortness of breath dyspnea   . Dysrhythmia   . Dizziness   . Anginal pain ([place])   . Palpitations   . Wheezing   . Arthritis   . Fracture     history of spinal fracture  . CHF (congestive heart failure) [facility])     Past Surgical History  Procedure Laterality Date  . Cholecystectomy    . Bladder surgery    . Knee surgery Right   . Wrist surgery Right   . Middle ear surgery    . Cardiac catheterization    . Cardiac defibrillator placement  4 yrs ago    pacemaker  . Abdominal hysterectomy    . Esophagogastroduodenoscopy N/A 07/06/2012    Procedure: ESOPHAGOGASTRODUODENOSCOPY (EGD);  Surgeon: Lafayette Dragon, MD;  Location: [facility];  Service: Endoscopy;  Laterality: N/A;  . Savory dilation N/A 07/06/2012    Procedure: SAVORY DILATION;  Surgeon: Lafayette Dragon, MD;  Location: [facility] ENDOSCOPY;  Service: Endoscopy;  Laterality: N/A;  . Colonoscopy    . Colonoscopy N/A 08/31/2012    Procedure: COLONOSCOPY;  Surgeon: Inda Castle, MD;   Location: [facility] ENDOSCOPY;  Service: Endoscopy;  Laterality: N/A;  . Colon surgery    . Cataract extraction w/phaco Left 09/01/2014    Procedure: CATARACT EXTRACTION PHACO AND INTRAOCULAR LENS PLACEMENT (IOC);  Surgeon: Lyla Glassing, MD;  Location: [facility] ORS;  Service: Ophthalmology;  Laterality: Left;  [place]: 1:12.1   . Cataract extraction w/phaco Right 09/29/2014    Procedure: CATARACT EXTRACTION PHACO AND INTRAOCULAR LENS PLACEMENT (IOC);  Surgeon: Lyla Glassing, MD;  Location: [facility] ORS;  Service: Ophthalmology;  Laterality: Right;  [place]: 00:52.7     Current Outpatient Prescriptions  Medication Sig Dispense Refill  . albuterol (PROVENTIL HFA) 108 (90 BASE) MCG/ACT inhaler Inhale 2 puffs into the lungs every 6 (six) hours as needed.     Marland Kitchen allopurinol (ZYLOPRIM) 300 MG tablet Take 450 mg by mouth daily.    Marland Kitchen aspirin 81 MG EC tablet Take 81 mg by mouth daily.      . carvedilol (COREG) 25 MG tablet Take 1 tablet (25 mg total) by mouth as directed. Take 1 tablet in the morning and 1/2 tablet in the evening 45 tablet 11  . cyclobenzaprine (FLEXERIL) 10 MG tablet Take 10 mg by mouth 3 (three) times daily as needed for muscle spasms.     . digoxin (LANOXIN) 0.125 MG tablet Take 125 mcg by mouth  every morning.     . docusate sodium (COLACE) 100 MG capsule One to three tablets daily    . furosemide (LASIX) 40 MG tablet TAKE ONE TABLET BY MOUTH TWICE DAILY 60 tablet 11  . indomethacin (INDOCIN) 50 MG capsule Take 50 mg by mouth 3 (three) times daily with meals.    Marland Kitchen ipratropium-albuterol (DUONEB) 0.5-2.5 (3) MG/3ML SOLN Take 3 mLs by nebulization every 4 (four) hours as needed (for wheezing).     Marland Kitchen levothyroxine (SYNTHROID, LEVOTHROID) 88 MCG tablet Take 112 mcg by mouth every morning.     . loratadine (CLARITIN) 10 MG tablet Take 10 mg by mouth daily.    Marland Kitchen losartan (COZAAR) 50 MG tablet Take 25 mg by mouth daily.    . Omega-3 Fatty Acids (FISH OIL) 1000 MG CAPS One capsule by mouth twice daily    .  omeprazole (PRILOSEC) 20 MG capsule Take 20 mg by mouth every morning.     Marland Kitchen oxycodone (OXY-IR) 5 MG capsule Take 5 mg by mouth every 4 (four) hours as needed.    . pregabalin (LYRICA) 150 MG capsule Take 150 mg by mouth 2 (two) times daily.    . roflumilast (DALIRESP) 500 MCG TABS tablet Take 500 mcg by mouth daily.    Marland Kitchen tiotropium (SPIRIVA) 18 MCG inhalation capsule Place 18 mcg into inhaler and inhale daily.     . traMADol (ULTRAM) 50 MG tablet Take by mouth every 6 (six) hours as needed.    . venlafaxine (EFFEXOR-XR) 37.5 MG 24 hr capsule Take 75 mg by mouth daily.     . vitamin B-12 (CYANOCOBALAMIN) 1000 MCG tablet 1,000 mcg. One tablet by mouth twice daily     No current facility-administered medications for this visit.    Allergies  Allergen Reactions  . Codeine Other (See Comments)  . Contrast Media [Iodinated Diagnostic Agents]     Review of Systems negative except from HPI and PMH  Physical Exam BP 100/70 mmHg  Pulse 77  Ht '5\' 3"'$  (1.6 m)  Wt 200 lb 12.8 oz (91.082 kg)  BMI 35.58 kg/m2 Well developed and well nourished wearing oxygen HENT normal-largely edentulous E scleral and icterus clear Neck Supple Clear with decreased BS Regular rate and rhythm, no murmurs gallops or rub Soft with active bowel sounds right upper quadrant tenderness No clubbing cyanosis  no Edema Alert and oriented, grossly normal motor and sensory function Marked limitations of shoulder activity bilaterally Skin Warm and Dry  ECG demonstrates P. synchronous pacing with  Intervals 16/13/42 with neg QRS V1 and bipahsic 1 Pre CRT implant QRS duration 180 ms   Assessment and  Plan Nonischemic cardiomyopathy  Congestive heart failure-chronic-systolic  Implantable defibrillator-[employer]-CRT The patient's device was interrogated and the information was fully reviewed.  The device was reprogrammed to shorten AV delay with exertion  Oxygen-dependent COPD  Stable Euvolemic continue current  meds

## 2015-03-23 NOTE — Patient Instructions (Signed)
Medication Instructions: - Your physician recommends that you continue on your current medications as directed. Please refer to the Current Medication list given to you today.  Labwork: - none  Procedures/Testing: - none  Follow-Up: - Remote monitoring is used to monitor your Pacemaker of ICD from home. This monitoring reduces the number of office visits required to check your device to one time per year. It allows [place] to keep an eye on the functioning of your device to ensure it is working properly. You are scheduled for a device check from home on 06/22/15. You may send your transmission at any time that day. If you have a wireless device, the transmission will be sent automatically. After your physician reviews your transmission, you will receive a postcard with your next transmission date.  - Your physician wants you to follow-up in: 1 year with Dr. Caryl Comes. You will receive a reminder letter in the mail two months in advance. If you don't receive a letter, please call our office to schedule the follow-up appointment.  Any Additional Special Instructions Will Be Listed Below (If Applicable).     If you need a refill on your cardiac medications before your next appointment, please call your pharmacy.

## 2015-03-29 DIAGNOSIS — J449 Chronic obstructive pulmonary disease, unspecified: Secondary | ICD-10-CM | POA: Diagnosis not present

## 2015-03-29 DIAGNOSIS — M545 Low back pain: Secondary | ICD-10-CM | POA: Diagnosis not present

## 2015-03-29 DIAGNOSIS — R700 Elevated erythrocyte sedimentation rate: Secondary | ICD-10-CM | POA: Diagnosis not present

## 2015-03-29 DIAGNOSIS — R413 Other amnesia: Secondary | ICD-10-CM | POA: Diagnosis not present

## 2015-04-04 DIAGNOSIS — R700 Elevated erythrocyte sedimentation rate: Secondary | ICD-10-CM | POA: Diagnosis not present

## 2015-04-13 DIAGNOSIS — M150 Primary generalized (osteo)arthritis: Secondary | ICD-10-CM | POA: Diagnosis not present

## 2015-04-13 DIAGNOSIS — M25562 Pain in left knee: Secondary | ICD-10-CM | POA: Diagnosis not present

## 2015-04-13 DIAGNOSIS — R899 Unspecified abnormal finding in specimens from other organs, systems and tissues: Secondary | ICD-10-CM | POA: Diagnosis not present

## 2015-04-13 DIAGNOSIS — M545 Low back pain: Secondary | ICD-10-CM | POA: Diagnosis not present

## 2015-04-13 DIAGNOSIS — M25561 Pain in right knee: Secondary | ICD-10-CM | POA: Diagnosis not present

## 2015-05-01 DIAGNOSIS — I420 Dilated cardiomyopathy: Secondary | ICD-10-CM | POA: Diagnosis not present

## 2015-05-01 DIAGNOSIS — I509 Heart failure, unspecified: Secondary | ICD-10-CM | POA: Diagnosis not present

## 2015-05-01 DIAGNOSIS — J449 Chronic obstructive pulmonary disease, unspecified: Secondary | ICD-10-CM | POA: Diagnosis not present

## 2015-05-01 DIAGNOSIS — I2510 Atherosclerotic heart disease of native coronary artery without angina pectoris: Secondary | ICD-10-CM | POA: Diagnosis not present

## 2015-05-01 DIAGNOSIS — R0602 Shortness of breath: Secondary | ICD-10-CM | POA: Diagnosis not present

## 2015-05-01 DIAGNOSIS — I10 Essential (primary) hypertension: Secondary | ICD-10-CM | POA: Diagnosis not present

## 2015-06-14 ENCOUNTER — Encounter: Payer: Self-pay | Admitting: Internal Medicine

## 2015-06-22 ENCOUNTER — Ambulatory Visit (INDEPENDENT_AMBULATORY_CARE_PROVIDER_SITE_OTHER): Payer: Medicare Other | Admitting: *Deleted

## 2015-06-22 DIAGNOSIS — Z95810 Presence of automatic (implantable) cardiac defibrillator: Secondary | ICD-10-CM

## 2015-06-22 DIAGNOSIS — I429 Cardiomyopathy, unspecified: Secondary | ICD-10-CM

## 2015-06-22 DIAGNOSIS — I428 Other cardiomyopathies: Secondary | ICD-10-CM

## 2015-06-23 NOTE — Progress Notes (Signed)
Remote ICD transmission.   

## 2015-06-26 DIAGNOSIS — F418 Other specified anxiety disorders: Secondary | ICD-10-CM | POA: Diagnosis not present

## 2015-06-26 DIAGNOSIS — Z1389 Encounter for screening for other disorder: Secondary | ICD-10-CM | POA: Diagnosis not present

## 2015-06-26 DIAGNOSIS — L309 Dermatitis, unspecified: Secondary | ICD-10-CM | POA: Diagnosis not present

## 2015-06-26 DIAGNOSIS — J9610 Chronic respiratory failure, unspecified whether with hypoxia or hypercapnia: Secondary | ICD-10-CM | POA: Diagnosis not present

## 2015-06-26 DIAGNOSIS — M545 Low back pain: Secondary | ICD-10-CM | POA: Diagnosis not present

## 2015-06-26 DIAGNOSIS — I509 Heart failure, unspecified: Secondary | ICD-10-CM | POA: Diagnosis not present

## 2015-06-26 DIAGNOSIS — R700 Elevated erythrocyte sedimentation rate: Secondary | ICD-10-CM | POA: Diagnosis not present

## 2015-06-26 DIAGNOSIS — Z79899 Other long term (current) drug therapy: Secondary | ICD-10-CM | POA: Diagnosis not present

## 2015-06-26 DIAGNOSIS — I10 Essential (primary) hypertension: Secondary | ICD-10-CM | POA: Diagnosis not present

## 2015-06-26 DIAGNOSIS — E039 Hypothyroidism, unspecified: Secondary | ICD-10-CM | POA: Diagnosis not present

## 2015-06-26 DIAGNOSIS — J449 Chronic obstructive pulmonary disease, unspecified: Secondary | ICD-10-CM | POA: Diagnosis not present

## 2015-06-26 DIAGNOSIS — M109 Gout, unspecified: Secondary | ICD-10-CM | POA: Diagnosis not present

## 2015-07-12 ENCOUNTER — Encounter: Payer: Self-pay | Admitting: Cardiology

## 2015-07-14 LAB — CUP PACEART REMOTE DEVICE CHECK
Brady Statistic AP VS Percent: 1 %
HIGH POWER IMPEDANCE MEASURED VALUE: 49 Ohm
Implantable Lead Implant Date: 20110309
Implantable Lead Model: 7121
Lead Channel Impedance Value: 550 Ohm
Lead Channel Pacing Threshold Amplitude: 1.125 V
Lead Channel Pacing Threshold Pulse Width: 0.5 ms
Lead Channel Sensing Intrinsic Amplitude: 5 mV
Lead Channel Setting Pacing Amplitude: 2.125
Lead Channel Setting Pacing Amplitude: 2.5 V
Lead Channel Setting Pacing Amplitude: 2.5 V
Lead Channel Setting Pacing Pulse Width: 0.5 ms
Lead Channel Setting Pacing Pulse Width: 0.8 ms
MDC IDC LEAD IMPLANT DT: 20110309
MDC IDC LEAD IMPLANT DT: 20110309
MDC IDC LEAD LOCATION: 753858
MDC IDC LEAD LOCATION: 753859
MDC IDC LEAD LOCATION: 753860
MDC IDC MSMT BATTERY REMAINING LONGEVITY: 17 mo
MDC IDC MSMT BATTERY REMAINING PERCENTAGE: 22 %
MDC IDC MSMT BATTERY VOLTAGE: 2.78 V
MDC IDC MSMT LEADCHNL LV IMPEDANCE VALUE: 640 Ohm
MDC IDC MSMT LEADCHNL RA IMPEDANCE VALUE: 350 Ohm
MDC IDC MSMT LEADCHNL RV SENSING INTR AMPL: 12 mV
MDC IDC PG SERIAL: 605521
MDC IDC SESS DTM: 20170518060010
MDC IDC SET LEADCHNL RV SENSING SENSITIVITY: 0.5 mV
MDC IDC STAT BRADY AP VP PERCENT: 2.6 %
MDC IDC STAT BRADY AS VP PERCENT: 96 %
MDC IDC STAT BRADY AS VS PERCENT: 1.4 %
MDC IDC STAT BRADY RA PERCENT PACED: 2.5 %

## 2015-07-19 ENCOUNTER — Telehealth: Payer: Self-pay

## 2015-07-19 NOTE — Telephone Encounter (Signed)
Patient referred to [facility] by Debroah Loop, device RN/Dr Caryl Comes.  Attempted to call and husband answered phone.  He stated patient was sleeping and he usually is the spokes person for her.  Advised would need to review chart for DPR to speak with him.  Advised would review chart and call him tomorrow.  Unable to locate DPR on file.

## 2015-07-21 NOTE — Telephone Encounter (Signed)
Spoke to patient.  She gave verbal permission to discuss PHI with her with her husband.  Spoke with husband.  Advised no DPR on file and will mail a DPR form and once completed to mail back.  Explained ICM program for patient and he agreed to monthly calls.  1st ICM transmission scheduled for 08/14/2015.  Provided phone number and reviewed fluid symptoms and encouraged to call should she experience any symptoms.

## 2015-07-22 IMAGING — CT CT ABD-PELV W/ CM
2 of 5 series · 17 of 46 positions shown, 19 images · IV contrast (isovue)
Comparison: CT ABD-PELV W/O CM dated 03/06/2013

CLINICAL DATA: Abdominal pain. History of bowel perforation 2 weeks
prior with surgery. Continued lower abdominal pain.

EXAM:
CT ABDOMEN AND PELVIS WITH CONTRAST
TECHNIQUE: Multidetector CT imaging of the abdomen and pelvis was performed
using the standard protocol following bolus administration of
intravenous contrast.
CONTRAST:  125 mL Isovue. Patient received and 1 hr steroid prep for
history of mild contrast reaction. No adverse reaction reported.

[Series 2: routine abd pel with · axial · 0.75mm/px · z∈[-915,-535]mm · 14 of 86 slices shown, 16 images]
[im 5/86  soft-tissue]
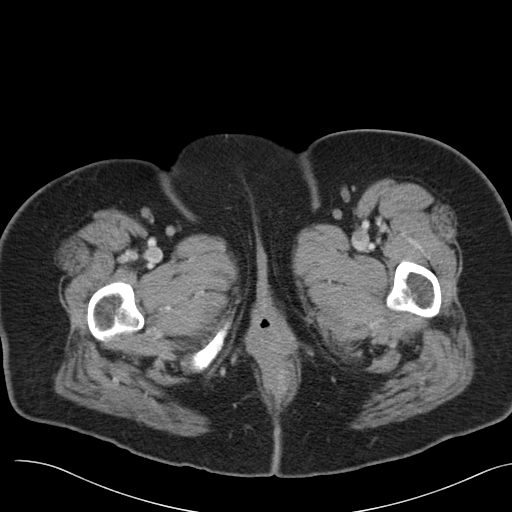
[im 5/86  bone]
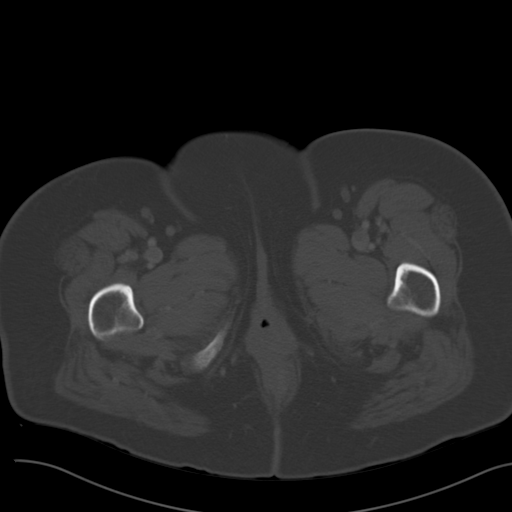
[im 9/86  soft-tissue]
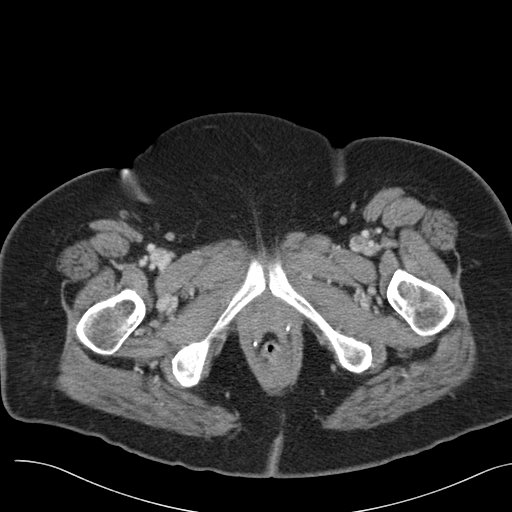
[im 18/86  soft-tissue]
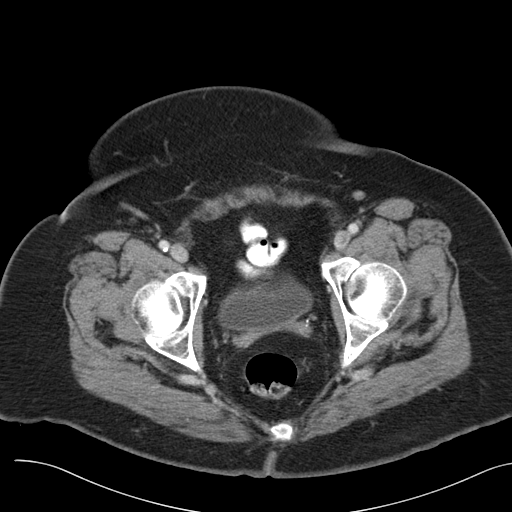
[im 23/86  soft-tissue]
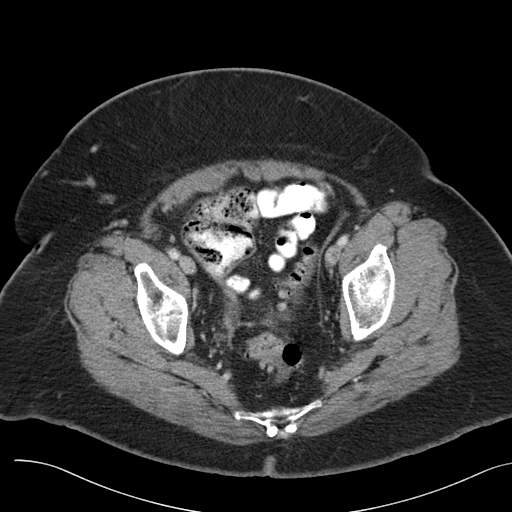
[im 27/86  soft-tissue]
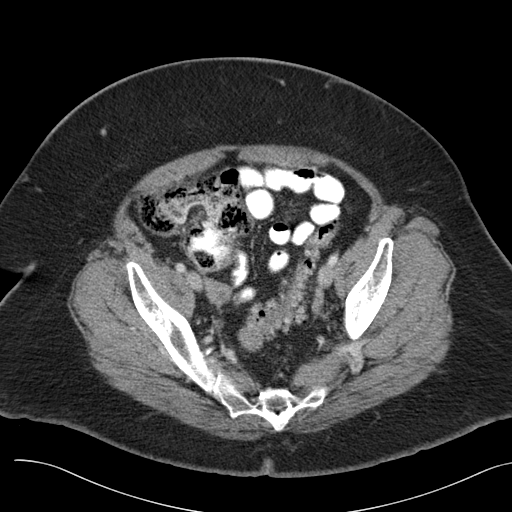
[im 36/86  soft-tissue]
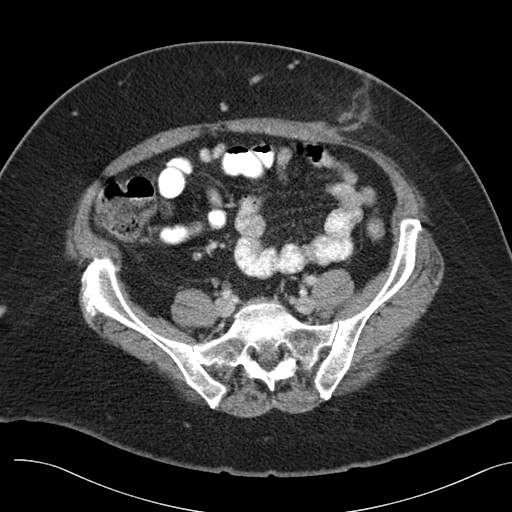
[im 41/86  soft-tissue]
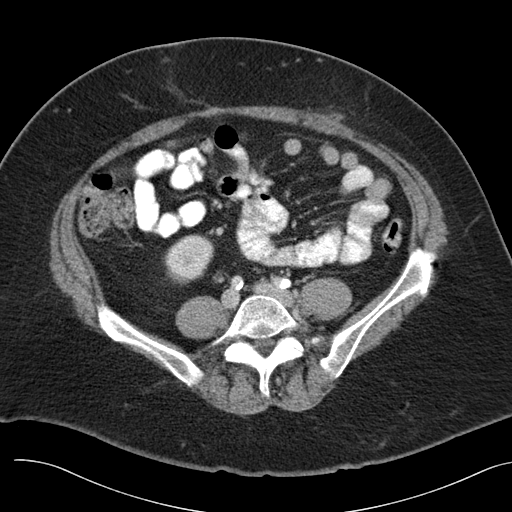
[im 45/86  soft-tissue]
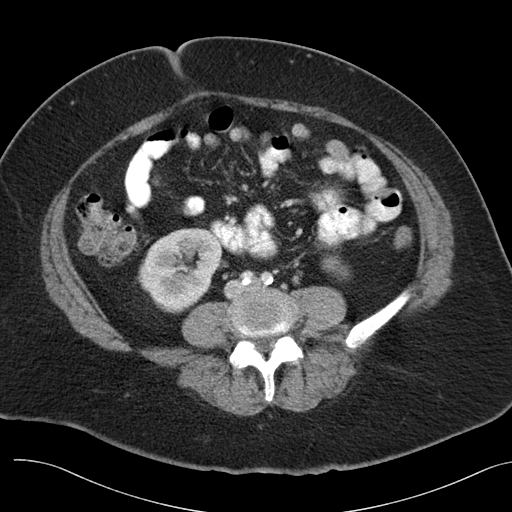
[im 50/86  soft-tissue]
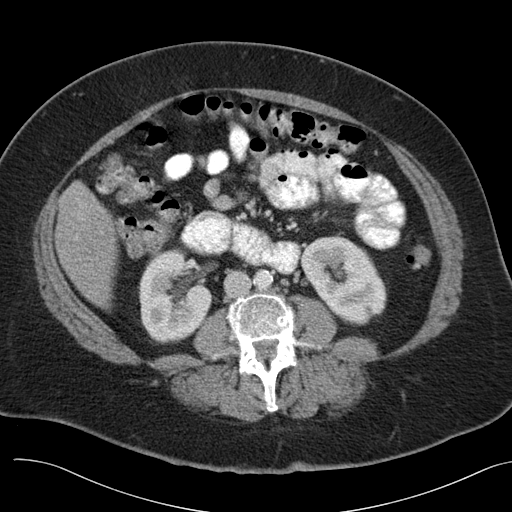
[im 50/86  bone]
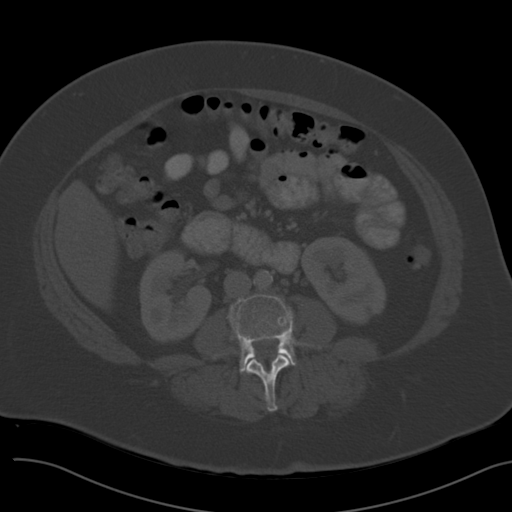
[im 59/86  soft-tissue]
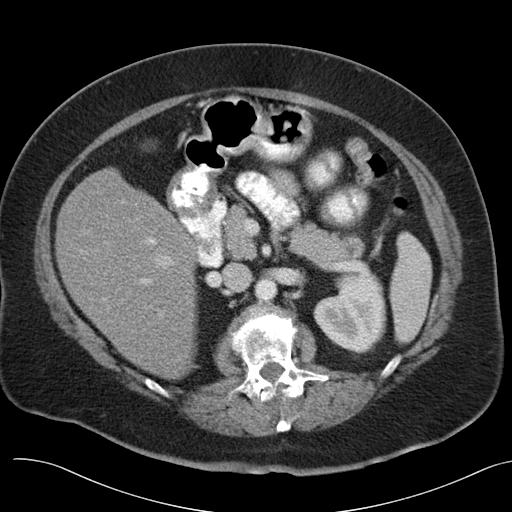
[im 63/86  soft-tissue]
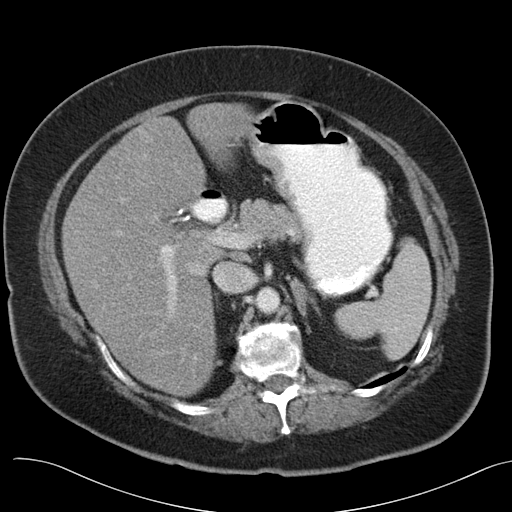
[im 68/86  soft-tissue]
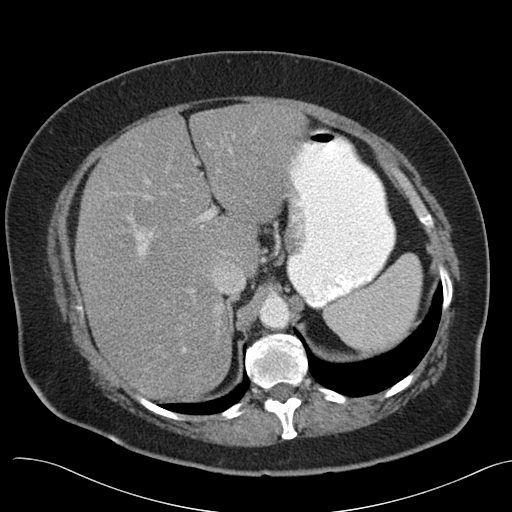
[im 77/86  soft-tissue]
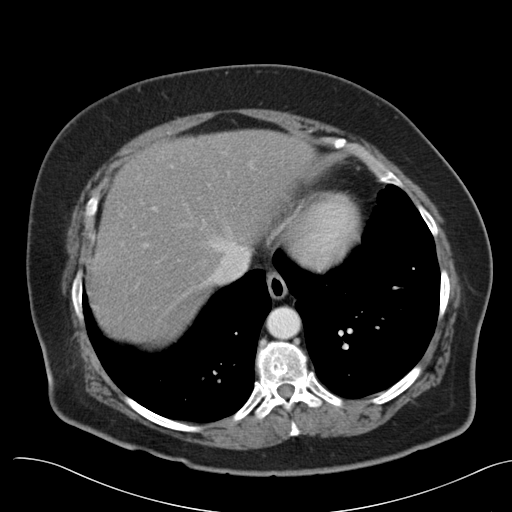
[im 81/86  soft-tissue]
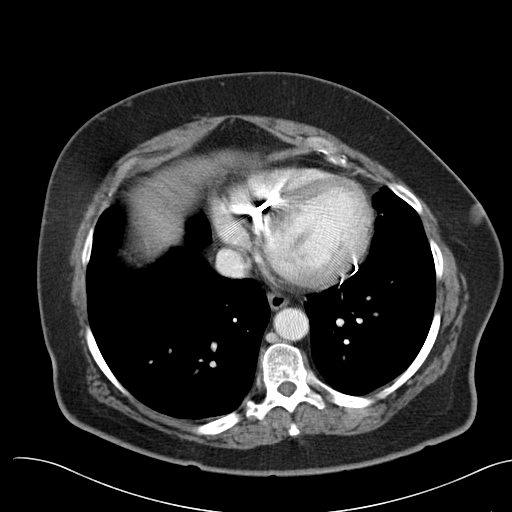

[Series 6: cor routine abd pel with · coronal · 0.89mm/px · 3 of 160 slices shown]
[im 54/160  soft-tissue]
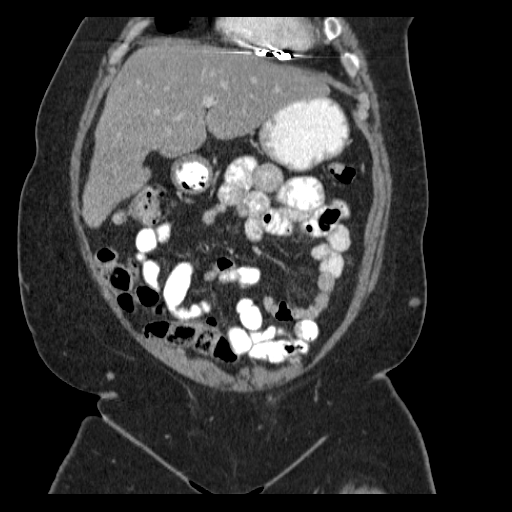
[im 71/160  soft-tissue]
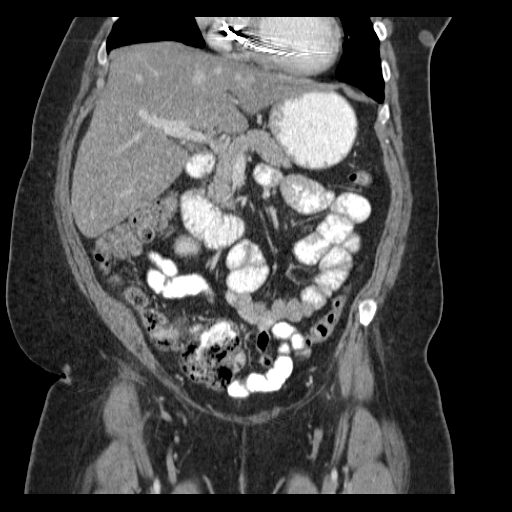
[im 89/160  soft-tissue]
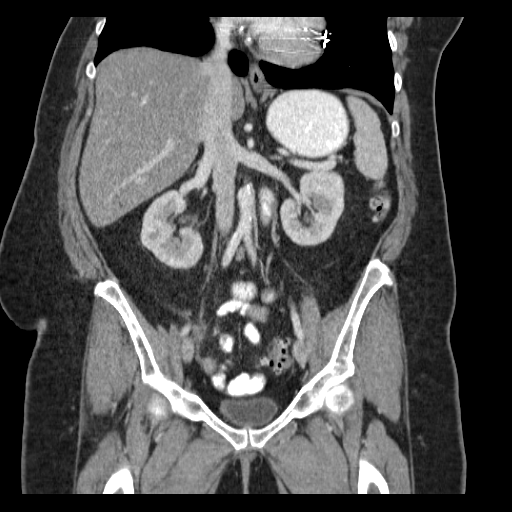

[17 of 46 positions shown; findings below may reference images not displayed]

FINDINGS: Lung base has are clear.  No pericardial fluid.

No focal hepatic lesion. Post cholecystectomy. The gallbladder,
pancreas, spleen, adrenal glands, kidneys are normal.

The stomach, duodenum, small bowel, and cecum are normal. There are
several diverticula scattered throughout the colon particularly
through the sigmoid colon. There is no evidence of acute
inflammation.

The fluid collections associated with the bowel in the right lower
quadrant have since resolved. No organized fluid collection
remaining. Contrast flows through the majority of the small bowel to
the cecum and mildly and disperses through the colon. No evidence of
obstruction.

Abdominal aorta is normal caliber. No retroperitoneal periportal
lymphadenopathy.

No free fluid the pelvis. Post hysterectomy anatomy. The bladder is
normal. No pelvic lymphadenopathy. No aggressive osseous lesions.

There is minimal subcutaneous stranding in the left anterior
abdominal wall related to recent surgery. No evidence of infection.
IMPRESSION: 1. No evidence of complication following abdominal surgery.
2. Interval resolution of the fluid collections in the right pelvis.
3. Sigmoid diverticulosis without diverticulitis.
4. No small subcutaneous stranding in the left mid abdomen related
to recent surgery. No evidence of abscess

## 2015-08-14 ENCOUNTER — Ambulatory Visit (INDEPENDENT_AMBULATORY_CARE_PROVIDER_SITE_OTHER): Payer: Medicare Other

## 2015-08-14 DIAGNOSIS — I5022 Chronic systolic (congestive) heart failure: Secondary | ICD-10-CM | POA: Diagnosis not present

## 2015-08-14 DIAGNOSIS — Z95810 Presence of automatic (implantable) cardiac defibrillator: Secondary | ICD-10-CM | POA: Diagnosis not present

## 2015-08-14 NOTE — Progress Notes (Signed)
EPIC Encounter for ICM Monitoring  Patient Name: Stacy Grimes is a 65 y.o. female Date: 08/14/2015 Primary Care Physican: Fae Pippin Primary Cardiologist: Caryl Comes Electrophysiologist: Caryl Comes Dry Weight: 204 lb   Bi-V Pacing:  99%       Heart Failure questions reviewed, pt asymptomatic.  She reported drinking plenty of fluids.   Thoracic impedence stable Low sodium diet education provided Recommendations: None.  She reported receiving DPR in mail and was completed and mailed back.    ICM trend: 08/14/2015      Follow-up plan: ICM clinic phone appointment on 09/21/2015.  Copy of ICM check sent to device physician.   [name], RN 08/14/2015 12:57 PM

## 2015-08-16 DIAGNOSIS — M545 Low back pain: Secondary | ICD-10-CM | POA: Diagnosis not present

## 2015-08-16 DIAGNOSIS — Z6836 Body mass index (BMI) 36.0-36.9, adult: Secondary | ICD-10-CM | POA: Diagnosis not present

## 2015-08-16 DIAGNOSIS — F418 Other specified anxiety disorders: Secondary | ICD-10-CM | POA: Diagnosis not present

## 2015-08-16 DIAGNOSIS — L309 Dermatitis, unspecified: Secondary | ICD-10-CM | POA: Diagnosis not present

## 2015-09-21 ENCOUNTER — Ambulatory Visit (INDEPENDENT_AMBULATORY_CARE_PROVIDER_SITE_OTHER): Payer: Medicare Other | Admitting: *Deleted

## 2015-09-21 ENCOUNTER — Ambulatory Visit (INDEPENDENT_AMBULATORY_CARE_PROVIDER_SITE_OTHER): Payer: Medicare Other

## 2015-09-21 DIAGNOSIS — Z95810 Presence of automatic (implantable) cardiac defibrillator: Secondary | ICD-10-CM | POA: Diagnosis not present

## 2015-09-21 DIAGNOSIS — I5022 Chronic systolic (congestive) heart failure: Secondary | ICD-10-CM

## 2015-09-21 DIAGNOSIS — I428 Other cardiomyopathies: Secondary | ICD-10-CM

## 2015-09-21 NOTE — Progress Notes (Signed)
EPIC Encounter for ICM Monitoring  Patient Name: Stacy Grimes is a 65 y.o. female Date: 09/21/2015 Primary Care Physican: Cyndi Bender, PA-C Primary Cardiologist: Caryl Comes Electrophysiologist: Caryl Comes Dry Weight: 200 lb  Bi-V Pacing:  98%       Heart Failure questions reviewed, pt asymptomatic.  Thoracic impedance normal since 08/25/2015.  Recommendations: No changes.  Low sodium diet education provided.    Follow-up plan: ICM clinic phone appointment on 10/25/2015.  Copy of ICM check sent to primary cardiologist and device physician.   ICM trend: 09/21/2015       [name], RN 09/21/2015 11:37 AM

## 2015-09-21 NOTE — Progress Notes (Signed)
Remote ICD transmission.   

## 2015-09-22 ENCOUNTER — Encounter: Payer: Self-pay | Admitting: Cardiology

## 2015-09-22 LAB — CUP PACEART REMOTE DEVICE CHECK
Battery Remaining Longevity: 14 mo
Battery Remaining Percentage: 18 %
Battery Voltage: 2.75 V
Brady Statistic AP VP Percent: 1.5 %
Brady Statistic AS VS Percent: 1.6 %
Brady Statistic RA Percent Paced: 1.5 %
Date Time Interrogation Session: 20170817070739
HighPow Impedance: 54 Ohm
Implantable Lead Implant Date: 20110309
Implantable Lead Implant Date: 20110309
Implantable Lead Location: 753858
Implantable Lead Location: 753859
Implantable Lead Model: 7121
Lead Channel Impedance Value: 660 Ohm
Lead Channel Impedance Value: 680 Ohm
Lead Channel Pacing Threshold Amplitude: 1.375 V
Lead Channel Pacing Threshold Pulse Width: 0.5 ms
Lead Channel Sensing Intrinsic Amplitude: 12 mV
Lead Channel Setting Pacing Amplitude: 2.375
Lead Channel Setting Pacing Amplitude: 2.5 V
MDC IDC LEAD IMPLANT DT: 20110309
MDC IDC LEAD LOCATION: 753860
MDC IDC MSMT LEADCHNL LV PACING THRESHOLD AMPLITUDE: 0.75 V
MDC IDC MSMT LEADCHNL LV PACING THRESHOLD PULSEWIDTH: 0.5 ms
MDC IDC MSMT LEADCHNL RA IMPEDANCE VALUE: 360 Ohm
MDC IDC MSMT LEADCHNL RA SENSING INTR AMPL: 4.6 mV
MDC IDC MSMT LEADCHNL RV PACING THRESHOLD AMPLITUDE: 1 V
MDC IDC MSMT LEADCHNL RV PACING THRESHOLD PULSEWIDTH: 0.8 ms
MDC IDC SET LEADCHNL LV PACING PULSEWIDTH: 0.5 ms
MDC IDC SET LEADCHNL RV PACING AMPLITUDE: 2.5 V
MDC IDC SET LEADCHNL RV PACING PULSEWIDTH: 0.8 ms
MDC IDC SET LEADCHNL RV SENSING SENSITIVITY: 0.5 mV
MDC IDC STAT BRADY AP VS PERCENT: 1 %
MDC IDC STAT BRADY AS VP PERCENT: 97 %
Pulse Gen Serial Number: 605521

## 2015-09-26 ENCOUNTER — Encounter: Payer: Self-pay | Admitting: Internal Medicine

## 2015-09-26 DIAGNOSIS — J449 Chronic obstructive pulmonary disease, unspecified: Secondary | ICD-10-CM | POA: Diagnosis not present

## 2015-09-26 DIAGNOSIS — F418 Other specified anxiety disorders: Secondary | ICD-10-CM | POA: Diagnosis not present

## 2015-09-26 DIAGNOSIS — M545 Low back pain: Secondary | ICD-10-CM | POA: Diagnosis not present

## 2015-09-26 DIAGNOSIS — Z9181 History of falling: Secondary | ICD-10-CM | POA: Diagnosis not present

## 2015-09-26 DIAGNOSIS — M109 Gout, unspecified: Secondary | ICD-10-CM | POA: Diagnosis not present

## 2015-09-26 DIAGNOSIS — L74519 Primary focal hyperhidrosis, unspecified: Secondary | ICD-10-CM | POA: Diagnosis not present

## 2015-09-26 DIAGNOSIS — Z79899 Other long term (current) drug therapy: Secondary | ICD-10-CM | POA: Diagnosis not present

## 2015-09-26 DIAGNOSIS — E7800 Pure hypercholesterolemia, unspecified: Secondary | ICD-10-CM | POA: Diagnosis not present

## 2015-09-26 DIAGNOSIS — R413 Other amnesia: Secondary | ICD-10-CM | POA: Diagnosis not present

## 2015-09-26 DIAGNOSIS — E039 Hypothyroidism, unspecified: Secondary | ICD-10-CM | POA: Diagnosis not present

## 2015-10-25 ENCOUNTER — Ambulatory Visit (INDEPENDENT_AMBULATORY_CARE_PROVIDER_SITE_OTHER): Payer: Medicare Other

## 2015-10-25 DIAGNOSIS — I5022 Chronic systolic (congestive) heart failure: Secondary | ICD-10-CM | POA: Diagnosis not present

## 2015-10-25 DIAGNOSIS — Z95810 Presence of automatic (implantable) cardiac defibrillator: Secondary | ICD-10-CM | POA: Diagnosis not present

## 2015-10-25 MED ORDER — FUROSEMIDE 40 MG PO TABS: 40.0000 mg | ORAL_TABLET | Freq: Two times a day (BID) | ORAL | 1 refills | Status: DC

## 2015-10-25 NOTE — Progress Notes (Signed)
Call back to husband and advised the [employer] rep would be ordering a cell adapter for him and should arrive in the next 2 days.  Encouraged to call if any difficulty setting up.   Explained importance of taking monitor with the patient for the month long trip.

## 2015-10-25 NOTE — Progress Notes (Signed)
EPIC Encounter for ICM Monitoring  Patient Name: Stacy Grimes is a 65 y.o. female Date: 10/25/2015 Primary Care Physican: Cyndi Bender, PA-C Primary Cardiologist: Caryl Comes Electrophysiologist: Caryl Comes Dry Weight: 206 lb  Bi-V Pacing:  98%       Spoke with husband.  Heart Failure questions reviewed, pt asymptomatic.  Reviewed HF symptoms to report and encouraged to call if needed.   Thoracic impedance normal.  Recommendations: No changes.  [place] at the beach for a [place] beginning around 11/11/2015 and unable to take monitor due to it is a landline and will not work at the hotel.  Advised would check if cell adapter can be ordered so they can take the monitor with they.  Advised the monitor will send alert if battery depletes or there is a change in her rhythm.   He requested refill for Furosemide and refill completed.  Follow-up plan: ICM clinic phone appointment on 11/09/2015 (prior to leaving for the beach).  Copy of ICM check sent to primary cardiologist and device physician.   ICM trend: 10/24/2015       [name], RN 10/25/2015 10:45 AM

## 2015-11-08 DIAGNOSIS — R05 Cough: Secondary | ICD-10-CM | POA: Diagnosis not present

## 2015-11-08 DIAGNOSIS — Z6836 Body mass index (BMI) 36.0-36.9, adult: Secondary | ICD-10-CM | POA: Diagnosis not present

## 2015-11-08 DIAGNOSIS — Z23 Encounter for immunization: Secondary | ICD-10-CM | POA: Diagnosis not present

## 2015-11-09 ENCOUNTER — Telehealth: Payer: Self-pay

## 2015-11-09 ENCOUNTER — Ambulatory Visit (INDEPENDENT_AMBULATORY_CARE_PROVIDER_SITE_OTHER): Payer: Medicare Other

## 2015-11-09 DIAGNOSIS — Z95810 Presence of automatic (implantable) cardiac defibrillator: Secondary | ICD-10-CM

## 2015-11-09 DIAGNOSIS — I5022 Chronic systolic (congestive) heart failure: Secondary | ICD-10-CM

## 2015-11-09 NOTE — Progress Notes (Signed)
EPIC Encounter for ICM Monitoring  Patient Name: Stacy Grimes is a 65 y.o. female Date: 11/09/2015 Primary Care Physican: Cyndi Bender, PA-C Primary [place] Electrophysiologist: Caryl Comes Dry Weight:    unknown   Bi-V Pacing:  98%             Attempted ICM call and unable to reach.  Transmission reviewed.   Thoracic impedance normal but did have some fluid accumulation in the la  Follow-up plan: ICM clinic phone appointment on 12/21/2015.  Copy of ICM check sent to device physician.   ICM trend: 11/09/2015       Stacy Billings, RN 11/09/2015 3:12 PM

## 2015-11-09 NOTE — Telephone Encounter (Signed)
Attempted ICM call and recording stated due to network difficulties, call cannot be completed and try back later.

## 2015-11-28 IMAGING — CR DG CHEST 1V PORT
1 series · 1 of 1 positions shown · non-contrast
Comparison: 11/29/2009

CLINICAL DATA: Diaphoresis and cough.

EXAM:
PORTABLE CHEST - 1 VIEW

[ap]
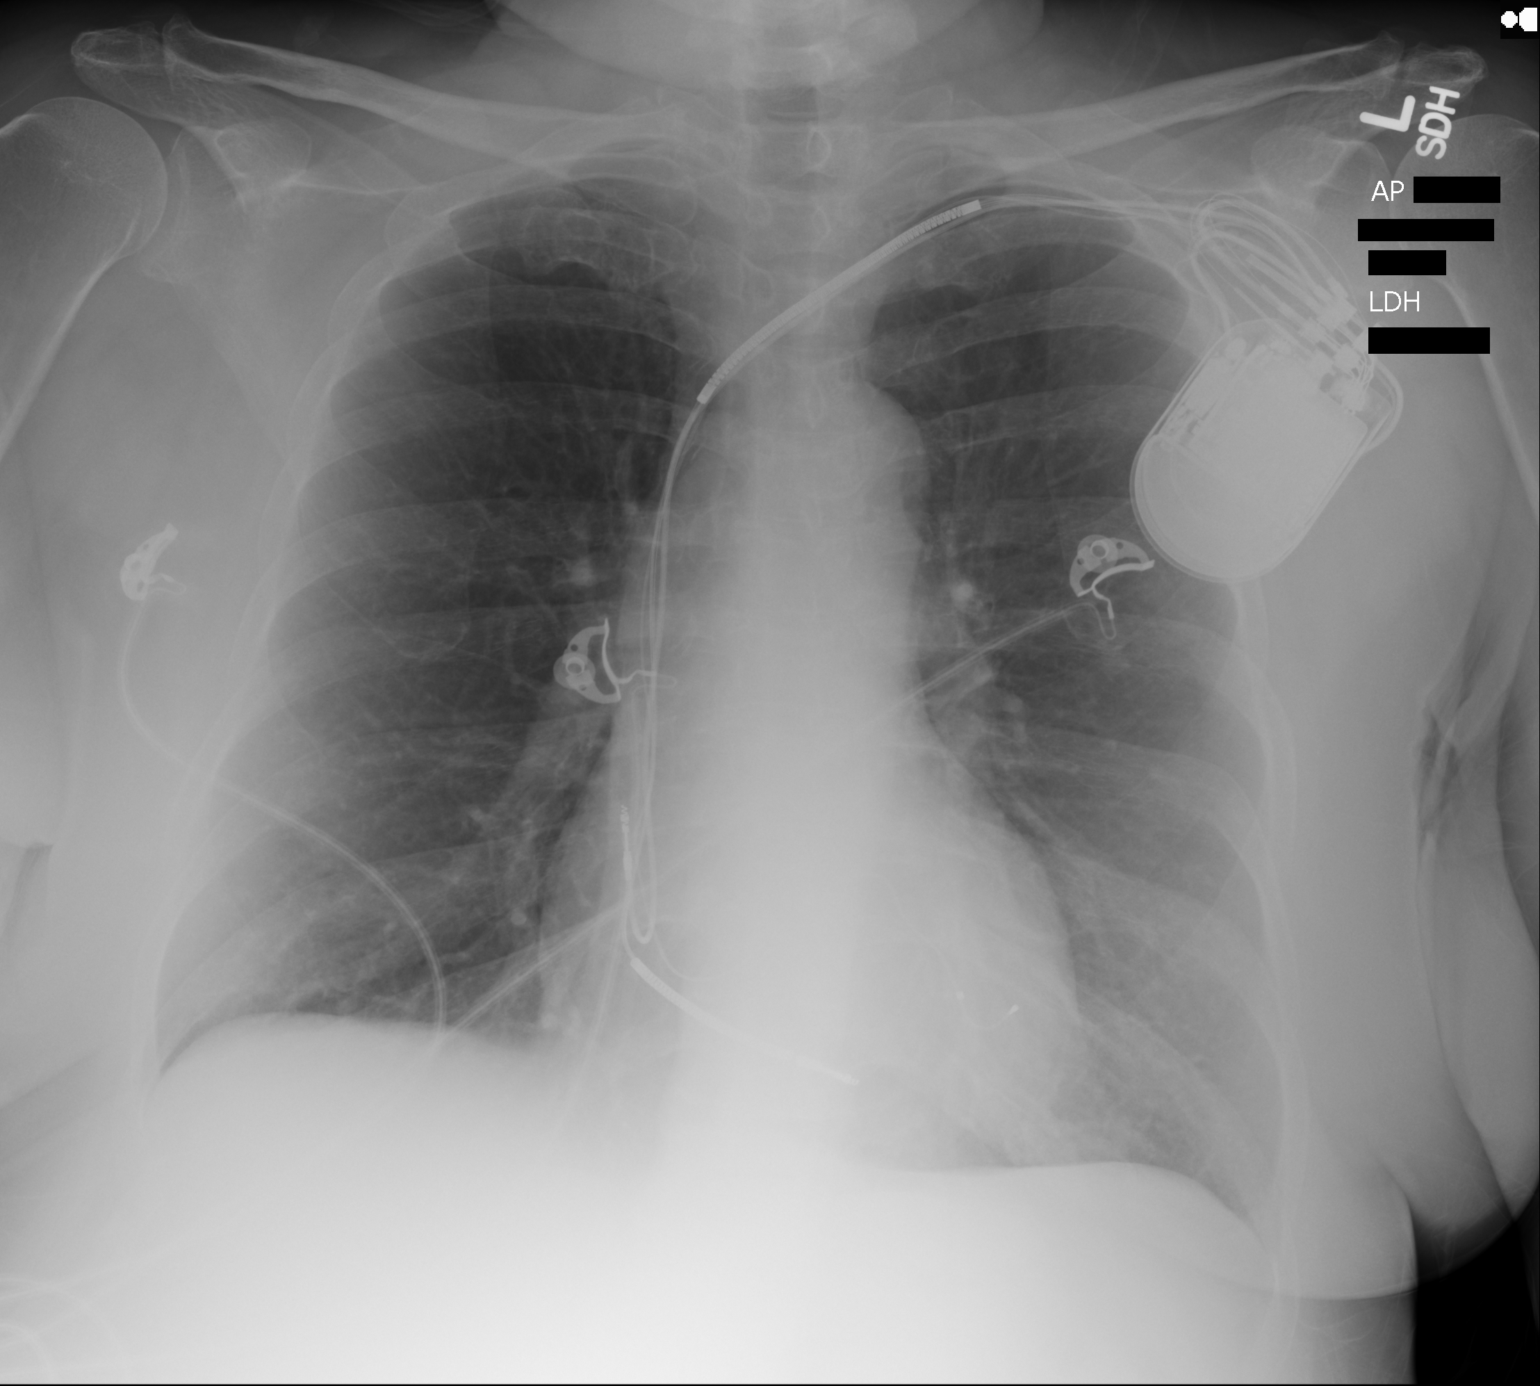

[1 of 1 positions shown; findings below may reference images not displayed]

FINDINGS: Three lead left-sided AICD/ pacemaker remains in place. The
cardiomediastinal silhouette is within normal limits. The lungs are
well inflated with new patchy opacity in the left lung base. No
pleural effusion or pneumothorax is identified. No acute osseous
abnormality is seen. Degenerative changes are noted at the
acromioclavicular joint.
IMPRESSION: New left basilar opacity suspicious for pneumonia.

## 2015-12-19 DIAGNOSIS — I2510 Atherosclerotic heart disease of native coronary artery without angina pectoris: Secondary | ICD-10-CM | POA: Diagnosis not present

## 2015-12-19 DIAGNOSIS — R079 Chest pain, unspecified: Secondary | ICD-10-CM | POA: Diagnosis not present

## 2015-12-19 DIAGNOSIS — I10 Essential (primary) hypertension: Secondary | ICD-10-CM | POA: Diagnosis not present

## 2015-12-19 DIAGNOSIS — I509 Heart failure, unspecified: Secondary | ICD-10-CM | POA: Diagnosis not present

## 2015-12-19 DIAGNOSIS — R1010 Upper abdominal pain, unspecified: Secondary | ICD-10-CM | POA: Diagnosis not present

## 2015-12-19 DIAGNOSIS — R0602 Shortness of breath: Secondary | ICD-10-CM | POA: Diagnosis not present

## 2015-12-19 DIAGNOSIS — I420 Dilated cardiomyopathy: Secondary | ICD-10-CM | POA: Diagnosis not present

## 2015-12-21 ENCOUNTER — Ambulatory Visit (INDEPENDENT_AMBULATORY_CARE_PROVIDER_SITE_OTHER): Payer: Medicare Other | Admitting: *Deleted

## 2015-12-21 DIAGNOSIS — Z95810 Presence of automatic (implantable) cardiac defibrillator: Secondary | ICD-10-CM

## 2015-12-21 DIAGNOSIS — I428 Other cardiomyopathies: Secondary | ICD-10-CM | POA: Diagnosis not present

## 2015-12-21 DIAGNOSIS — I5022 Chronic systolic (congestive) heart failure: Secondary | ICD-10-CM | POA: Diagnosis not present

## 2015-12-21 NOTE — Progress Notes (Signed)
Remote ICD transmission.   

## 2015-12-22 NOTE — Progress Notes (Signed)
EPIC Encounter for ICM Monitoring  Patient Name: Stacy Grimes is a 65 y.o. female Date: 12/22/2015 Primary Care Physican: Cyndi Bender, PA-C Primary [place] Electrophysiologist: Caryl Comes Dry Weight:209 lbs Bi-V Pacing: 98%      Heart Failure questions reviewed, pt asymptomatic   Thoracic impedance normal   Recommendations: No changes.  Reinforced low salt food choices especially during the holidays and limiting fluid intake to < 2 liters per day.  Encouraged to call for fluid symptoms.    Follow-up plan: ICM clinic phone appointment on 01/23/2016.  Copy of ICM check sent to device physician.   ICM trend: 12/21/2015       [name], RN 12/22/2015 12:13 PM

## 2015-12-27 ENCOUNTER — Encounter: Payer: Self-pay | Admitting: Cardiology

## 2015-12-27 DIAGNOSIS — Z79899 Other long term (current) drug therapy: Secondary | ICD-10-CM | POA: Diagnosis not present

## 2015-12-27 DIAGNOSIS — M545 Low back pain: Secondary | ICD-10-CM | POA: Diagnosis not present

## 2015-12-27 DIAGNOSIS — Z6836 Body mass index (BMI) 36.0-36.9, adult: Secondary | ICD-10-CM | POA: Diagnosis not present

## 2015-12-27 DIAGNOSIS — J449 Chronic obstructive pulmonary disease, unspecified: Secondary | ICD-10-CM | POA: Diagnosis not present

## 2015-12-27 DIAGNOSIS — J9610 Chronic respiratory failure, unspecified whether with hypoxia or hypercapnia: Secondary | ICD-10-CM | POA: Diagnosis not present

## 2015-12-27 DIAGNOSIS — M109 Gout, unspecified: Secondary | ICD-10-CM | POA: Diagnosis not present

## 2015-12-27 DIAGNOSIS — Z23 Encounter for immunization: Secondary | ICD-10-CM | POA: Diagnosis not present

## 2015-12-27 DIAGNOSIS — I509 Heart failure, unspecified: Secondary | ICD-10-CM | POA: Diagnosis not present

## 2015-12-27 NOTE — Progress Notes (Signed)
Letter  

## 2016-01-01 LAB — CUP PACEART REMOTE DEVICE CHECK
Battery Remaining Percentage: 12 %
Battery Voltage: 2.71 V
Brady Statistic AP VP Percent: 1.2 %
Brady Statistic AS VP Percent: 97 %
Brady Statistic RA Percent Paced: 1.1 %
Date Time Interrogation Session: 20171116094309
HIGH POWER IMPEDANCE MEASURED VALUE: 52 Ohm
Implantable Lead Implant Date: 20110309
Implantable Lead Implant Date: 20110309
Implantable Lead Location: 753859
Implantable Lead Location: 753860
Implantable Lead Model: 7121
Lead Channel Impedance Value: 660 Ohm
Lead Channel Pacing Threshold Amplitude: 0.75 V
Lead Channel Pacing Threshold Amplitude: 1 V
Lead Channel Pacing Threshold Pulse Width: 0.5 ms
Lead Channel Pacing Threshold Pulse Width: 0.5 ms
Lead Channel Pacing Threshold Pulse Width: 0.8 ms
Lead Channel Sensing Intrinsic Amplitude: 12 mV
Lead Channel Setting Pacing Amplitude: 2.5 V
Lead Channel Setting Pacing Pulse Width: 0.8 ms
Lead Channel Setting Sensing Sensitivity: 0.5 mV
MDC IDC LEAD IMPLANT DT: 20110309
MDC IDC LEAD LOCATION: 753858
MDC IDC MSMT BATTERY REMAINING LONGEVITY: 10 mo
MDC IDC MSMT LEADCHNL RA IMPEDANCE VALUE: 350 Ohm
MDC IDC MSMT LEADCHNL RA PACING THRESHOLD AMPLITUDE: 1.125 V
MDC IDC MSMT LEADCHNL RA SENSING INTR AMPL: 4.7 mV
MDC IDC MSMT LEADCHNL RV IMPEDANCE VALUE: 650 Ohm
MDC IDC PG IMPLANT DT: 20110309
MDC IDC PG SERIAL: 605521
MDC IDC SET LEADCHNL LV PACING PULSEWIDTH: 0.5 ms
MDC IDC SET LEADCHNL RA PACING AMPLITUDE: 2.125
MDC IDC SET LEADCHNL RV PACING AMPLITUDE: 2.5 V
MDC IDC STAT BRADY AP VS PERCENT: 1 %
MDC IDC STAT BRADY AS VS PERCENT: 1.8 %

## 2016-01-03 ENCOUNTER — Other Ambulatory Visit: Payer: Self-pay | Admitting: Internal Medicine

## 2016-01-23 ENCOUNTER — Ambulatory Visit (INDEPENDENT_AMBULATORY_CARE_PROVIDER_SITE_OTHER): Payer: Medicare Other

## 2016-01-23 DIAGNOSIS — Z95810 Presence of automatic (implantable) cardiac defibrillator: Secondary | ICD-10-CM

## 2016-01-23 DIAGNOSIS — I5022 Chronic systolic (congestive) heart failure: Secondary | ICD-10-CM | POA: Diagnosis not present

## 2016-01-23 NOTE — Progress Notes (Signed)
EPIC Encounter for ICM Monitoring  Patient Name: Stacy Grimes is a 65 y.o. female Date: 01/23/2016 Primary Care Physican: Cyndi Bender, PA-C Primary [place] Electrophysiologist: Caryl Comes Dry Weight:209 lbs Bi-V Pacing: 98%      Spoke with husband. Heart Failure questions reviewed, pt asymptomatic   Thoracic impedance normal   Recommendations: No changes.  Reinforced to limit low salt food choices to 2000 mg day and limiting fluid intake to < 2 liters per day. Encouraged to call for fluid symptoms.  Provided direct ICM number.   Follow-up plan: ICM clinic phone appointment on 02/23/2016.  Office appointment with 03/22/2016.  Copy of ICM check sent to device physician.   ICM trend: 01/23/2016       [name], RN 01/23/2016 9:33 AM

## 2016-02-23 ENCOUNTER — Ambulatory Visit (INDEPENDENT_AMBULATORY_CARE_PROVIDER_SITE_OTHER): Payer: Medicare Other

## 2016-02-23 DIAGNOSIS — I5022 Chronic systolic (congestive) heart failure: Secondary | ICD-10-CM

## 2016-02-23 DIAGNOSIS — Z95810 Presence of automatic (implantable) cardiac defibrillator: Secondary | ICD-10-CM

## 2016-02-23 NOTE — Progress Notes (Signed)
EPIC Encounter for ICM Monitoring  Patient Name: Stacy Grimes is a 66 y.o. female Date: 02/23/2016 Primary Care Physican: Cyndi Bender, PA-C Primary [place] Electrophysiologist: Caryl Comes Dry Weight:202 lbs Bi-V Pacing: 98%         Heart Failure questions reviewed, pt asymptomatic   Thoracic impedance slightly below baseline  Recommendations: No changes. Discussed limiting dietary salt intake to 2000 mg/day and fluid intake to < 2 liters per day. Encouraged to call for fluid symptoms.  Follow-up plan: ICM clinic phone appointment on 04/22/2016 and office appointment with Dr Caryl Comes 03/22/2016.  Copy of ICM check sent to device physician.   3 month ICM trend: 02/23/2016   1 Year ICM trend:      [name], RN 02/23/2016 6:57 PM

## 2016-03-18 ENCOUNTER — Encounter: Payer: Self-pay | Admitting: Internal Medicine

## 2016-03-22 ENCOUNTER — Encounter: Payer: Medicare Other | Admitting: Internal Medicine

## 2016-03-24 ENCOUNTER — Other Ambulatory Visit: Payer: Self-pay | Admitting: Internal Medicine

## 2016-03-25 NOTE — Telephone Encounter (Signed)
NEW MESSAGE   . *STAT* If patient is at the pharmacy, call can be transferred to refill team.   1. Which medications need to be refilled? (please list name of each medication and dose if known) floresenmide '40mg'$  2. Which pharmacy/location (including street and city if local pharmacy) is medication to be sent to? [place]  3. Do they need a 30 day or 90 day supply? Stacy Grimes

## 2016-03-26 NOTE — Telephone Encounter (Signed)
°*  STAT* If patient is at the pharmacy, call can be transferred to refill team.   1. Which medications need to be refilled? (please list name of each medication and dose if known) Furosamide 40 mg  2. Which pharmacy/location (including street and city if local pharmacy) is medication to be sent to? [place]  3. Do they need a 30 day or 90 day supply? 90 day supply  Called to confirm appt, pt's husband requested refill again, hasn't heard anything, told him it was received Sunday and we aren't here on weekends, then have a 24-48 hour return call time for refills

## 2016-03-28 ENCOUNTER — Encounter (INDEPENDENT_AMBULATORY_CARE_PROVIDER_SITE_OTHER): Payer: Self-pay

## 2016-03-28 ENCOUNTER — Ambulatory Visit (INDEPENDENT_AMBULATORY_CARE_PROVIDER_SITE_OTHER): Payer: Medicare Other | Admitting: Internal Medicine

## 2016-03-28 ENCOUNTER — Encounter: Payer: Self-pay | Admitting: Internal Medicine

## 2016-03-28 VITALS — BP 90/70 | HR 90 | Ht 63.0 in | Wt 206.4 lb

## 2016-03-28 DIAGNOSIS — I5022 Chronic systolic (congestive) heart failure: Secondary | ICD-10-CM

## 2016-03-28 DIAGNOSIS — I428 Other cardiomyopathies: Secondary | ICD-10-CM | POA: Diagnosis not present

## 2016-03-28 DIAGNOSIS — Z95810 Presence of automatic (implantable) cardiac defibrillator: Secondary | ICD-10-CM | POA: Diagnosis not present

## 2016-03-28 LAB — CUP PACEART INCLINIC DEVICE CHECK
HIGH POWER IMPEDANCE MEASURED VALUE: 57.2361
Implantable Lead Implant Date: 20110309
Implantable Lead Implant Date: 20110309
Implantable Lead Location: 753858
Implantable Lead Location: 753860
Implantable Pulse Generator Implant Date: 20110309
Lead Channel Impedance Value: 412.5 Ohm
Lead Channel Impedance Value: 575 Ohm
Lead Channel Pacing Threshold Amplitude: 0.75 V
Lead Channel Pacing Threshold Pulse Width: 0.5 ms
Lead Channel Pacing Threshold Pulse Width: 0.5 ms
Lead Channel Sensing Intrinsic Amplitude: 5 mV
Lead Channel Setting Pacing Amplitude: 1.875
Lead Channel Setting Pacing Pulse Width: 0.5 ms
Lead Channel Setting Sensing Sensitivity: 0.5 mV
MDC IDC LEAD IMPLANT DT: 20110309
MDC IDC LEAD LOCATION: 753859
MDC IDC MSMT LEADCHNL LV IMPEDANCE VALUE: 700 Ohm
MDC IDC MSMT LEADCHNL RA PACING THRESHOLD AMPLITUDE: 0.875 V
MDC IDC MSMT LEADCHNL RA PACING THRESHOLD PULSEWIDTH: 0.5 ms
MDC IDC MSMT LEADCHNL RV PACING THRESHOLD AMPLITUDE: 0.75 V
MDC IDC MSMT LEADCHNL RV SENSING INTR AMPL: 12 mV
MDC IDC SESS DTM: 20180222143327
MDC IDC SET LEADCHNL LV PACING AMPLITUDE: 2.5 V
MDC IDC SET LEADCHNL RV PACING AMPLITUDE: 2.5 V
MDC IDC SET LEADCHNL RV PACING PULSEWIDTH: 0.5 ms
MDC IDC STAT BRADY RA PERCENT PACED: 0.87 %
MDC IDC STAT BRADY RV PERCENT PACED: 98 %
Pulse Gen Serial Number: 605521

## 2016-03-28 LAB — COMPREHENSIVE METABOLIC PANEL
ALBUMIN: 4.2 g/dL (ref 3.6–4.8)
ALK PHOS: 98 IU/L (ref 39–117)
ALT: 12 IU/L (ref 0–32)
AST: 14 IU/L (ref 0–40)
Albumin/Globulin Ratio: 1.1 — ABNORMAL LOW (ref 1.2–2.2)
BUN / CREAT RATIO: 13 (ref 12–28)
BUN: 13 mg/dL (ref 8–27)
Bilirubin Total: <0.2 mg/dL (ref 0.0–1.2)
CO2: 31 mmol/L — AB (ref 18–29)
CREATININE: 1.04 mg/dL — AB (ref 0.57–1.00)
Calcium: 9.6 mg/dL (ref 8.7–10.3)
Chloride: 92 mmol/L — ABNORMAL LOW (ref 96–106)
GFR calc Af Amer: 65 (ref >59)
GFR calc non Af Amer: 56 — ABNORMAL LOW (ref >59)
GLUCOSE: 128 mg/dL — AB (ref 65–99)
Globulin, Total: 4 (ref 1.5–4.5)
Potassium: 4.7 mmol/L (ref 3.5–5.2)
Sodium: 142 mmol/L (ref 134–144)
Total Protein: 8.2 g/dL (ref 6.0–8.5)

## 2016-03-28 LAB — CBC WITH DIFFERENTIAL/PLATELET
Basophils Absolute: 0 10*3/uL (ref 0.0–0.2)
Basos: 0 %
EOS (ABSOLUTE): 0.2 10*3/uL (ref 0.0–0.4)
EOS: 3 %
HEMATOCRIT: 37.4 % (ref 34.0–46.6)
HEMOGLOBIN: 12 g/dL (ref 11.1–15.9)
IMMATURE GRANS (ABS): 0 10*3/uL (ref 0.0–0.1)
IMMATURE GRANULOCYTES: 0 %
LYMPHS ABS: 2.4 10*3/uL (ref 0.7–3.1)
LYMPHS: 26 %
MCH: 26.3 pg — ABNORMAL LOW (ref 26.6–33.0)
MCHC: 32.1 g/dL (ref 31.5–35.7)
MCV: 82 fL (ref 79–97)
MONOCYTES: 8 %
Monocytes Absolute: 0.7 10*3/uL (ref 0.1–0.9)
NEUTROS PCT: 63 %
Neutrophils Absolute: 5.7 10*3/uL (ref 1.4–7.0)
Platelets: 431 10*3/uL — ABNORMAL HIGH (ref 150–379)
RBC: 4.56 x10E6/uL (ref 3.77–5.28)
RDW: 14.6 % (ref 12.3–15.4)
WBC: 9.1 10*3/uL (ref 3.4–10.8)

## 2016-03-28 LAB — TSH: TSH: 1.02 u[IU]/mL (ref 0.450–4.500)

## 2016-03-28 LAB — DIGOXIN LEVEL: DIGOXIN, SERUM: 1.1 ng/mL — AB (ref 0.5–0.9)

## 2016-03-28 MED ORDER — DIGOXIN 125 MCG PO TABS: 125.0000 ug | ORAL_TABLET | Freq: Every morning | ORAL | 2 refills | Status: DC

## 2016-03-28 MED ORDER — FUROSEMIDE 40 MG PO TABS: 40.0000 mg | ORAL_TABLET | Freq: Two times a day (BID) | ORAL | 2 refills | Status: DC

## 2016-03-28 NOTE — Patient Instructions (Addendum)
Medication Instructions: Your physician recommends that you continue on your current medications as directed. Please refer to the Current Medication list given to you today.   Labwork: Your physician recommends that you have lab work today: CMET, CBC, Digoxin, and TSH   Procedures/Testing: None Ordered  Follow-Up: Your physician recommends that you schedule a follow-up appointment in 3 MONTHS with the Device [facility]   Any Additional Special Instructions Will Be Listed Below (If Applicable).     If you need a refill on your cardiac medications before your next appointment, please call your pharmacy.

## 2016-03-28 NOTE — Progress Notes (Signed)
Patient Care Team: Cyndi Bender, PA-C as PCP - General (Physician Assistant)   HPI  Stacy Grimes is a 66 y.o. 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   Stable on current O2 therapy.  She remains quite short of breath.  No chest pain  Chronic mild edema  Catheterization 2010 demonstrated an ejection fraction of 15-20% without obstructive coronary disease     Past Medical History:  Diagnosis Date  . AAA (abdominal aortic aneurysm) ([place])   . Anginal pain ([place])   . Anxiety   . Arthritis   . Automatic implantable cardioverter-defibrillator in situ   . Biventricular ICD  [employer]   . CHF (congestive heart failure) ([place])   . Chronic systolic heart failure ([place])   . COPD (chronic obstructive pulmonary disease) ([place])   . Deafness    left ear  . Depression   . Dizziness   . Dysrhythmia   . Fracture    history of spinal fracture  . GERD (gastroesophageal reflux disease)   . Hypertension   . Hypothyroidism   . Non-ischemic cardiomyopathy ([place])   . Oxygen dependent   . Palpitations   . Shortness of breath dyspnea   . Sleep apnea   . Wheezing     Past Surgical History:  Procedure Laterality Date  . ABDOMINAL HYSTERECTOMY    . BLADDER SURGERY    . CARDIAC CATHETERIZATION    . CARDIAC DEFIBRILLATOR PLACEMENT  4 yrs ago   pacemaker  . CATARACT EXTRACTION W/PHACO Left 09/01/2014   Procedure: CATARACT EXTRACTION PHACO AND INTRAOCULAR LENS PLACEMENT (IOC);  Surgeon: Lyla Glassing, MD;  Location: [facility] ORS;  Service: Ophthalmology;  Laterality: Left;  [place]: 1:12.1   . CATARACT EXTRACTION W/PHACO Right 09/29/2014   Procedure: CATARACT EXTRACTION PHACO AND INTRAOCULAR LENS PLACEMENT (IOC);  Surgeon: Lyla Glassing, MD;  Location: [facility] ORS;  Service: Ophthalmology;  Laterality: Right;  [place]: 00:52.7   . CHOLECYSTECTOMY    . COLON SURGERY    . COLONOSCOPY    . COLONOSCOPY N/A  08/31/2012   Procedure: COLONOSCOPY;  Surgeon: Inda Castle, MD;  Location: [facility];  Service: Endoscopy;  Laterality: N/A;  . ESOPHAGOGASTRODUODENOSCOPY N/A 07/06/2012   Procedure: ESOPHAGOGASTRODUODENOSCOPY (EGD);  Surgeon: Lafayette Dragon, MD;  Location: [facility];  Service: Endoscopy;  Laterality: N/A;  . KNEE SURGERY Right   . MIDDLE EAR SURGERY    . SAVORY DILATION N/A 07/06/2012   Procedure: SAVORY DILATION;  Surgeon: Lafayette Dragon, MD;  [facility]: [facility];  Service: Endoscopy;  Laterality: N/A;  . WRIST SURGERY Right     Current Outpatient Prescriptions  Medication Sig Dispense Refill  . albuterol (PROVENTIL HFA) 108 (90 BASE) MCG/ACT inhaler Inhale 2 puffs into the lungs every 6 (six) hours as needed.     Marland Kitchen allopurinol (ZYLOPRIM) 300 MG tablet Take 450 mg by mouth daily.    Marland Kitchen aspirin 81 MG EC tablet Take 81 mg by mouth daily.      . carvedilol (COREG) 25 MG tablet Take 1 tablet (25 mg total) by mouth as directed. Take 1 tablet in the morning and 1/2 tablet in the evening 45 tablet 11  . cyclobenzaprine (FLEXERIL) 10 MG tablet Take 10 mg by mouth 3 (three) times daily as needed for muscle spasms.     . digoxin (LANOXIN) 0.125 MG tablet Take 125 mcg by mouth every morning.     Marland Kitchen  docusate sodium (COLACE) 100 MG capsule One to three tablets daily    . furosemide (LASIX) 40 MG tablet Take 1 tablet (40 mg total) by mouth 2 (two) times daily. *Patient needs to keep 03/28/16 appointment for further refills* 60 tablet 0  . indomethacin (INDOCIN) 50 MG capsule Take 50 mg by mouth 3 (three) times daily with meals.    Marland Kitchen ipratropium-albuterol (DUONEB) 0.5-2.5 (3) MG/3ML SOLN Take 3 mLs by nebulization every 4 (four) hours as needed (for wheezing).     Marland Kitchen levothyroxine (SYNTHROID, LEVOTHROID) 88 MCG tablet Take 112 mcg by mouth every morning.     . loratadine (CLARITIN) 10 MG tablet Take 10 mg by mouth daily.    Marland Kitchen losartan (COZAAR) 50 MG tablet Take 25 mg by mouth daily.    . Omega-3 Fatty  Acids (FISH OIL) 1000 MG CAPS One capsule by mouth twice daily    . omeprazole (PRILOSEC) 20 MG capsule Take 20 mg by mouth every morning.     Marland Kitchen oxycodone (OXY-IR) 5 MG capsule Take 5 mg by mouth every 4 (four) hours as needed.    . pregabalin (LYRICA) 150 MG capsule Take 150 mg by mouth 2 (two) times daily.    . roflumilast (DALIRESP) 500 MCG TABS tablet Take 500 mcg by mouth daily.    Marland Kitchen tiotropium (SPIRIVA) 18 MCG inhalation capsule Place 18 mcg into inhaler and inhale daily.     . traMADol (ULTRAM) 50 MG tablet Take by mouth every 6 (six) hours as needed.    . venlafaxine (EFFEXOR-XR) 37.5 MG 24 hr capsule Take 75 mg by mouth daily.     . vitamin B-12 (CYANOCOBALAMIN) 1000 MCG tablet 1,000 mcg. One tablet by mouth twice daily     No current facility-administered medications for this visit.     Allergies  Allergen Reactions  . Codeine Other (See Comments)  . Contrast Media [Iodinated Diagnostic Agents]     Review of Systems negative except from HPI and PMH  Physical Exam BP 90/70   Pulse 90   Ht '5\' 3"'$  (1.6 m)   Wt 206 lb 6.4 oz (93.6 kg)   SpO2 96%   BMI 36.56 kg/m  Well developed and well nourished wearing oxygen HENT normal-largely edentulous E scleral and icterus clear Neck Supple Clear with decreased BS Regular rate and rhythm, no murmurs gallops or rub Soft with active bowel sounds right upper quadrant tenderness No clubbing cyanosis  no Edema Alert and oriented, grossly normal motor and sensory function Marked limitations of shoulder activity bilaterally Skin Warm and Dry  ECG demonstrates P. synchronous pacing with  Intervals 18/10/42 with neg QRS V1 and bipahsic 1 Pre CRT implant QRS duration 180 ms   Assessment and  Plan Nonischemic cardiomyopathy  Congestive heart failure-chronic-systolic  Implantable defibrillator-[employer]-CRT The patient's device was interrogated and the information was fully reviewed.  The device was reprogrammed to shorten AV delay  with exertion  Oxygen-dependent COPD  Stable Euvolemic continue current meds   She is on high-risk medications. She has a history of hypokalemia. We'll check her blood work today.  Her device is approaching ERI. If she is transferring her care will recheck (we have refilled for nowg  Euvolemic continue current meds

## 2016-03-29 ENCOUNTER — Other Ambulatory Visit: Payer: Self-pay | Admitting: *Deleted

## 2016-03-29 MED ORDER — DIGOXIN 125 MCG PO TABS: 125.0000 ug | ORAL_TABLET | ORAL | Status: DC

## 2016-04-01 DIAGNOSIS — G2581 Restless legs syndrome: Secondary | ICD-10-CM | POA: Diagnosis not present

## 2016-04-01 DIAGNOSIS — Z6836 Body mass index (BMI) 36.0-36.9, adult: Secondary | ICD-10-CM | POA: Diagnosis not present

## 2016-04-01 DIAGNOSIS — R413 Other amnesia: Secondary | ICD-10-CM | POA: Diagnosis not present

## 2016-04-01 DIAGNOSIS — M545 Low back pain: Secondary | ICD-10-CM | POA: Diagnosis not present

## 2016-04-01 DIAGNOSIS — E039 Hypothyroidism, unspecified: Secondary | ICD-10-CM | POA: Diagnosis not present

## 2016-04-01 DIAGNOSIS — F418 Other specified anxiety disorders: Secondary | ICD-10-CM | POA: Diagnosis not present

## 2016-04-01 DIAGNOSIS — J449 Chronic obstructive pulmonary disease, unspecified: Secondary | ICD-10-CM | POA: Diagnosis not present

## 2016-04-01 DIAGNOSIS — I509 Heart failure, unspecified: Secondary | ICD-10-CM | POA: Diagnosis not present

## 2016-04-22 ENCOUNTER — Ambulatory Visit (INDEPENDENT_AMBULATORY_CARE_PROVIDER_SITE_OTHER): Payer: Medicare Other

## 2016-04-22 DIAGNOSIS — Z95810 Presence of automatic (implantable) cardiac defibrillator: Secondary | ICD-10-CM

## 2016-04-22 DIAGNOSIS — I5022 Chronic systolic (congestive) heart failure: Secondary | ICD-10-CM

## 2016-04-22 NOTE — Progress Notes (Signed)
EPIC Encounter for ICM Monitoring  Patient Name: KMYA PLACIDE is a 66 y.o. female Date: 04/22/2016 Primary Care Physican: Cyndi Bender, PA-C Primary [place] Electrophysiologist: Caryl Comes Dry Weight:202 lbs Bi-V Pacing: 99% Battery ERI:~ 5.5 months           Spoke with husband. Heart Failure questions reviewed, pt asymptomatic for fluid but is having some weakness and generally not feeling well.   Thoracic impedance normal but was abnormal suggesting fluid accumulation x 18 days from 03/25/2016 to 04/12/2016.  Prescribed and confirmed dosage: Furosemide 40 mg 1 tablet twice a day  Labs: 03/28/2016 Creatinine 1.04, BUN 13, Potassium 4.7, Sodium 142, EGFR 56-65  Recommendations:  No changes. Reminded to limit dietary salt intake to 2000 mg/day and fluid intake to < 2 liters/day. Encouraged to call for fluid symptoms.  Follow-up plan: ICM clinic phone appointment on 05/23/2016.  Copy of ICM check sent to device physician.   3 month ICM trend: 319/2018   1 Year ICM trend:      [name], RN 04/22/2016 8:19 AM

## 2016-05-23 ENCOUNTER — Ambulatory Visit (INDEPENDENT_AMBULATORY_CARE_PROVIDER_SITE_OTHER): Payer: Medicare Other

## 2016-05-23 DIAGNOSIS — Z95810 Presence of automatic (implantable) cardiac defibrillator: Secondary | ICD-10-CM | POA: Diagnosis not present

## 2016-05-23 DIAGNOSIS — I5022 Chronic systolic (congestive) heart failure: Secondary | ICD-10-CM

## 2016-05-23 NOTE — Progress Notes (Signed)
EPIC Encounter for ICM Monitoring  Patient Name: Stacy Grimes is a 66 y.o. female Date: 05/23/2016 Primary Care Physican: Cyndi Bender, PA-C Primary [place] Electrophysiologist: Caryl Comes Dry Weight:202lbs Bi-V Pacing: 99% Battery ERI:~ 5.2 months      Spoke with husband, DPR.  Heart Failure questions reviewed, pt asymptomatic for fluid symptoms.   Thoracic impedance trending up suggesting dryness.  Reviewed transmission with Dr Caryl Comes in the office and no changes.  Prescribed and confirmed dosage: Furosemide 40 mg 1 tablet twice a day  Labs: 03/28/2016 Creatinine 1.04, BUN 13, Potassium 4.7, Sodium 142, EGFR 56-65  Recommendations: No changes. Encouraged to call for fluid symptoms.  Follow-up plan: ICM clinic phone appointment on 07/30/2016 since she has a defib office appointment scheduled on 06/26/2016 with device clinic.  Copy of ICM check sent to device physician.   3 month ICM trend: 05/23/2016         1 Year ICM trend:      [name], RN 05/23/2016 10:13 AM

## 2016-06-26 ENCOUNTER — Ambulatory Visit (INDEPENDENT_AMBULATORY_CARE_PROVIDER_SITE_OTHER): Payer: Medicare Other | Admitting: *Deleted

## 2016-06-26 DIAGNOSIS — I5022 Chronic systolic (congestive) heart failure: Secondary | ICD-10-CM

## 2016-06-26 DIAGNOSIS — Z95810 Presence of automatic (implantable) cardiac defibrillator: Secondary | ICD-10-CM | POA: Diagnosis not present

## 2016-06-26 DIAGNOSIS — I428 Other cardiomyopathies: Secondary | ICD-10-CM | POA: Diagnosis not present

## 2016-06-26 DIAGNOSIS — Z803 Family history of malignant neoplasm of breast: Secondary | ICD-10-CM | POA: Diagnosis not present

## 2016-06-26 DIAGNOSIS — Z1231 Encounter for screening mammogram for malignant neoplasm of breast: Secondary | ICD-10-CM | POA: Diagnosis not present

## 2016-06-26 LAB — CUP PACEART INCLINIC DEVICE CHECK
HighPow Impedance: 56.8715
Implantable Lead Implant Date: 20110309
Implantable Lead Implant Date: 20110309
Implantable Lead Location: 753858
Implantable Lead Location: 753860
Implantable Lead Model: 7121
Lead Channel Impedance Value: 700 Ohm
Lead Channel Pacing Threshold Amplitude: 1 V
Lead Channel Pacing Threshold Pulse Width: 0.5 ms
Lead Channel Sensing Intrinsic Amplitude: 12 mV
Lead Channel Sensing Intrinsic Amplitude: 5 mV
Lead Channel Setting Pacing Amplitude: 1.75 V
Lead Channel Setting Pacing Amplitude: 2.5 V
Lead Channel Setting Pacing Pulse Width: 0.5 ms
Lead Channel Setting Pacing Pulse Width: 0.5 ms
MDC IDC LEAD IMPLANT DT: 20110309
MDC IDC LEAD LOCATION: 753859
MDC IDC MSMT LEADCHNL LV PACING THRESHOLD AMPLITUDE: 0.75 V
MDC IDC MSMT LEADCHNL LV PACING THRESHOLD PULSEWIDTH: 0.5 ms
MDC IDC MSMT LEADCHNL RA IMPEDANCE VALUE: 412.5 Ohm
MDC IDC MSMT LEADCHNL RA PACING THRESHOLD AMPLITUDE: 1 V
MDC IDC MSMT LEADCHNL RA PACING THRESHOLD PULSEWIDTH: 0.5 ms
MDC IDC MSMT LEADCHNL RV IMPEDANCE VALUE: 637.5 Ohm
MDC IDC PG IMPLANT DT: 20110309
MDC IDC PG SERIAL: 605521
MDC IDC SESS DTM: 20180523135621
MDC IDC SET LEADCHNL RA PACING AMPLITUDE: 2 V
MDC IDC SET LEADCHNL RV SENSING SENSITIVITY: 0.5 mV
MDC IDC STAT BRADY RA PERCENT PACED: 0.09 %
MDC IDC STAT BRADY RV PERCENT PACED: 99 %

## 2016-06-26 NOTE — Progress Notes (Signed)
CRT-D device check in office. Thresholds and sensing consistent with previous device measurements. Lead impedance trends stable over time. 2 mode switch episodes recorded (<1%)--AT, longest 6 sec. No ventricular arrhythmia episodes recorded. Patient bi-ventricularly pacing 99% of the time. Device programmed with appropriate safety margins; LV output reduced to 1.75V @ 0.67ms. Heart failure diagnostics reviewed and trends are unstable for patient, asymptomatic at this time, encouraged patient to reduce sodium intake, followed by ICM clinic. Vibratory alerts demonstrated for patient, aware to call if she feels alert. Estimated longevity 4.1 months. Patient enrolled in remote follow up. Patient education completed including shock plan. [place]lin on 09/25/16 and ROV with SK in 03/2017.

## 2016-07-05 DIAGNOSIS — E039 Hypothyroidism, unspecified: Secondary | ICD-10-CM | POA: Diagnosis not present

## 2016-07-05 DIAGNOSIS — F418 Other specified anxiety disorders: Secondary | ICD-10-CM | POA: Diagnosis not present

## 2016-07-05 DIAGNOSIS — Z1389 Encounter for screening for other disorder: Secondary | ICD-10-CM | POA: Diagnosis not present

## 2016-07-05 DIAGNOSIS — Z9181 History of falling: Secondary | ICD-10-CM | POA: Diagnosis not present

## 2016-07-05 DIAGNOSIS — M545 Low back pain: Secondary | ICD-10-CM | POA: Diagnosis not present

## 2016-07-05 DIAGNOSIS — J449 Chronic obstructive pulmonary disease, unspecified: Secondary | ICD-10-CM | POA: Diagnosis not present

## 2016-07-05 DIAGNOSIS — E7800 Pure hypercholesterolemia, unspecified: Secondary | ICD-10-CM | POA: Diagnosis not present

## 2016-07-05 DIAGNOSIS — I10 Essential (primary) hypertension: Secondary | ICD-10-CM | POA: Diagnosis not present

## 2016-07-05 DIAGNOSIS — I509 Heart failure, unspecified: Secondary | ICD-10-CM | POA: Diagnosis not present

## 2016-07-05 DIAGNOSIS — M109 Gout, unspecified: Secondary | ICD-10-CM | POA: Diagnosis not present

## 2016-07-08 ENCOUNTER — Telehealth: Payer: Self-pay

## 2016-07-08 ENCOUNTER — Ambulatory Visit (INDEPENDENT_AMBULATORY_CARE_PROVIDER_SITE_OTHER): Payer: Medicare Other

## 2016-07-08 DIAGNOSIS — Z95810 Presence of automatic (implantable) cardiac defibrillator: Secondary | ICD-10-CM

## 2016-07-08 DIAGNOSIS — I5022 Chronic systolic (congestive) heart failure: Secondary | ICD-10-CM

## 2016-07-08 NOTE — Progress Notes (Signed)
EPIC Encounter for ICM Monitoring  Patient Name: Stacy Grimes is a 66 y.o. female Date: 07/08/2016 Primary Care Physican: Cyndi Bender, PA-C Primary [place] Electrophysiologist: Caryl Comes Dry Weight:last weight 202lbs Bi-V Pacing: 99% Battery ERI:~ 4.0 months      Heart Failure questions reviewed, pt asymptomatic.  Patient was checked in the office 06/26/16 and device nurse sent message patient was retaining some fluid.    Thoracic impedance abnormal suggesting fluid accumulation from 06/24/2016 to 07/06/2016.      Prescribed and confirmed dosage: Furosemide 40 mg 1 tablet twice a day  Labs: 03/28/2016 Creatinine 1.04, BUN 13, Potassium 4.7, Sodium 142, EGFR 56-65  Recommendations: No changes. Discussed to limit salt intake to 2000 mg/day and fluid intake to < 2 liters/day.  Encouraged to call for fluid symptoms or use local ER for any urgent symptoms.  Follow-up plan: ICM clinic phone appointment on 07/30/2016.    Copy of ICM check sent to device physician.   3 month ICM trend: 07/08/2016   1 Year ICM trend:      [name], RN 07/08/2016 3:09 PM

## 2016-07-08 NOTE — Telephone Encounter (Signed)
Call to patient and advised that her device showed some fluid accumulation when she was in the office on 06/26/2016 and I wanted to follow up with her.  She said she is feeling fine and denied any fluid symptoms.  Asked if she could send a remote transmission and she said she would need her husband's help and he will be working in the yard until late this evening.  Requested she have her husband assist her this evening with remote transmission and will review tomorrow.

## 2016-07-30 ENCOUNTER — Ambulatory Visit (INDEPENDENT_AMBULATORY_CARE_PROVIDER_SITE_OTHER): Payer: Medicare Other

## 2016-07-30 DIAGNOSIS — I5022 Chronic systolic (congestive) heart failure: Secondary | ICD-10-CM

## 2016-07-30 DIAGNOSIS — Z95810 Presence of automatic (implantable) cardiac defibrillator: Secondary | ICD-10-CM

## 2016-07-30 NOTE — Progress Notes (Signed)
EPIC Encounter for ICM Monitoring  Patient Name: Stacy Grimes is a 66 y.o. female Date: 07/30/2016 Primary Care Physican: Cyndi Bender, PA-C Primary [place] Electrophysiologist: Caryl Comes Dry Weight:last weight 202lbs Bi-V Pacing: 99% Battery ERI:~ 3.12months                                           Heart Failure questions reviewed, pt asymptomatic.      Thoracic impedance normal but was abnormal 06/24/2016 to 07/11/2016    Prescribed and confirmed dosage: Furosemide 40 mg 1 tablet twice a day  Recommendations: No changes.   Encouraged to call for fluid symptoms.  Follow-up plan: ICM clinic phone appointment on 08/30/2016.    Copy of ICM check sent to device physician.   3 month ICM trend: 07/30/2016   1 Year ICM trend:      [name], RN 07/30/2016 7:47 AM

## 2016-08-05 ENCOUNTER — Other Ambulatory Visit: Payer: Self-pay | Admitting: Internal Medicine

## 2016-08-30 ENCOUNTER — Ambulatory Visit (INDEPENDENT_AMBULATORY_CARE_PROVIDER_SITE_OTHER): Payer: Self-pay | Admitting: *Deleted

## 2016-08-30 ENCOUNTER — Ambulatory Visit (INDEPENDENT_AMBULATORY_CARE_PROVIDER_SITE_OTHER): Payer: Medicare Other

## 2016-08-30 DIAGNOSIS — I5022 Chronic systolic (congestive) heart failure: Secondary | ICD-10-CM

## 2016-08-30 DIAGNOSIS — Z95810 Presence of automatic (implantable) cardiac defibrillator: Secondary | ICD-10-CM | POA: Diagnosis not present

## 2016-08-30 NOTE — Progress Notes (Signed)
EPIC Encounter for ICM Monitoring  Patient Name: Stacy Grimes is a 66 y.o. female Date: 08/30/2016 Primary Care Physican: Cyndi Bender, PA-C Primary [place] Electrophysiologist: Caryl Comes Dry Weight: 202lbs Bi-V Pacing: 98% Battery ERI: < 3.66months      Spoke with husband. Heart Failure questions reviewed, pt asymptomatic.   Thoracic impedance normal.  Prescribed dosage: Furosemide 40 mg 1 tablet twice a day  Recommendations: No changes.  Advised to limit salt intake to 2000 mg/day and fluid intake to < 2 liters/day.  Encouraged to call for fluid symptoms.  Follow-up plan: ICM clinic phone appointment on 09/30/2016.    Copy of ICM check sent to device physician.   3 month ICM trend: 08/30/2016   1 Year ICM trend:      [name], RN 08/30/2016 7:47 AM

## 2016-08-30 NOTE — Progress Notes (Signed)
Remote ICD transmission.   

## 2016-09-05 ENCOUNTER — Encounter: Payer: Self-pay | Admitting: Cardiology

## 2016-09-06 ENCOUNTER — Telehealth: Payer: Self-pay | Admitting: Cardiology

## 2016-09-06 NOTE — Telephone Encounter (Signed)
Spoke w/ pt husband and informed him that pt device reached ERI. Informed him that a scheduler will be in touch to schedule an appt w/ MD, NP, or PA. At that appointment the risk and benefits of the procedure will be discussed and the procedure will be scheduled for another day. Pt husband verbalized understanding.

## 2016-09-09 ENCOUNTER — Telehealth: Payer: Self-pay | Admitting: Internal Medicine

## 2016-09-09 NOTE — Telephone Encounter (Signed)
New Message    Pt son cancelled wound check due to her being in a facility and they did not make arrangements for transportation

## 2016-09-09 NOTE — Telephone Encounter (Signed)
Pt does not have a wound check apt scheduled. Pt has an appointment to discuss generator change on 09/10/2016 at 11:00am and pts son verified this apt. Routing back to scheduling

## 2016-09-10 ENCOUNTER — Encounter: Payer: Self-pay | Admitting: *Deleted

## 2016-09-10 ENCOUNTER — Ambulatory Visit (INDEPENDENT_AMBULATORY_CARE_PROVIDER_SITE_OTHER): Payer: Medicare Other | Admitting: Physician Assistant

## 2016-09-10 ENCOUNTER — Encounter: Payer: Self-pay | Admitting: Physician Assistant

## 2016-09-10 VITALS — BP 126/72 | HR 98 | Ht 63.0 in | Wt 223.0 lb

## 2016-09-10 DIAGNOSIS — I5022 Chronic systolic (congestive) heart failure: Secondary | ICD-10-CM

## 2016-09-10 DIAGNOSIS — I428 Other cardiomyopathies: Secondary | ICD-10-CM | POA: Diagnosis not present

## 2016-09-10 DIAGNOSIS — Z4502 Encounter for adjustment and management of automatic implantable cardiac defibrillator: Secondary | ICD-10-CM | POA: Diagnosis not present

## 2016-09-10 DIAGNOSIS — I10 Essential (primary) hypertension: Secondary | ICD-10-CM | POA: Diagnosis not present

## 2016-09-10 MED ORDER — FUROSEMIDE 40 MG PO TABS: 40.0000 mg | ORAL_TABLET | Freq: Two times a day (BID) | ORAL | 6 refills | Status: DC

## 2016-09-10 NOTE — Patient Instructions (Addendum)
Medication Instructions:   TAKE LASIX THREE TIMES A DAY FOR FOUR DAYS ONLY   THEN STARTTAKE TWICE A DAY   If you need a refill on your cardiac medications before your next appointment, please call your pharmacy.  Labwork: BMET  MAG CBC W DIFF AND PTINR   Testing/Procedures: Your physician has requested that you have an echocardiogram. Echocardiography is a painless test that uses sound waves to create images of your heart. It provides your doctor with information about the size and shape of your heart and how well your heart's chambers and valves are working. This procedure takes approximately one hour. There are no restrictions for this procedure.   ALSO WILL GET CONTACTED  BACK ON GEN CHANGE DATE AND TIMES   Follow-Up: BASED UPON RESULTS   Any Other Special Instructions Will Be Listed Below (If Applicable).  MAKE SURE YOU WEIGH YOUR SELF DAILY

## 2016-09-10 NOTE — Progress Notes (Signed)
Cardiology Office Note Date:  09/10/2016  Patient ID:  Stacy Grimes, Stacy Grimes 1950/05/30, MRN [id number] PCP:  Cyndi Bender, PA-C  Cardiologist:  Dr. Caryl Comes   Chief Complaint: device at ERI  History of Present Illness: Stacy Grimes is a 66 y.o. female with history of NICM, ICD, COPD chronically on O2, chronic CHF (systolic), HTN, hypothyroidism, sleep apnea, comes in today to be seen for Dr. Caryl Comes, last seen by him in Feb.  At that time she was doing well, noting she was nearing ERI.  She comes today accompanied by her husband.  The patient reports feeling at what she considers to be her baseline.  She is chronically SOB even at rest to some degree on chronic O2, she has DOE that she does not perceive to be unusual though her husband thinks is more then usual in the last couple weeks.  She has a chronic sharp/shooting type CP that goes back 8-9 years, is not exertional or positional, quite random without change to his behavior over the years and they report has been felt to be musculoskeletal.  No palpitations, no near syncope or syncope.  She will be fleetingly lightheaded if she stands to quickly   Device information: SJM CRT-D, implanted 04/12/09, Dr. Caryl Comes, [place]   Past Medical History:  Diagnosis Date  . AAA (abdominal aortic aneurysm) ([place])   . Anginal pain ([place])   . Anxiety   . Arthritis   . Automatic implantable cardioverter-defibrillator in situ   . Biventricular ICD  [employer]   . CHF (congestive heart failure) ([place])   . Chronic systolic heart failure ([place])   . COPD (chronic obstructive pulmonary disease) ([place])   . Deafness    left ear  . Depression   . Dizziness   . Dysrhythmia   . Fracture    history of spinal fracture  . GERD (gastroesophageal reflux disease)   . Hypertension   . Hypothyroidism   . Non-ischemic cardiomyopathy ([place])   . Oxygen dependent   . Palpitations   . Shortness of breath dyspnea   . Sleep apnea   . Wheezing     Past Surgical History:    Procedure Laterality Date  . ABDOMINAL HYSTERECTOMY    . BLADDER SURGERY    . CARDIAC CATHETERIZATION    . CARDIAC DEFIBRILLATOR PLACEMENT  4 yrs ago   pacemaker  . CATARACT EXTRACTION W/PHACO Left 09/01/2014   Procedure: CATARACT EXTRACTION PHACO AND INTRAOCULAR LENS PLACEMENT (IOC);  Surgeon: Lyla Glassing, MD;  Location: [facility];  Service: Ophthalmology;  Laterality: Left;  [place]: 1:12.1   . CATARACT EXTRACTION W/PHACO Right 09/29/2014   Procedure: CATARACT EXTRACTION PHACO AND INTRAOCULAR LENS PLACEMENT (IOC);  Surgeon: Lyla Glassing, MD;  Location: [facility];  Service: Ophthalmology;  Laterality: Right;  [place]: 00:52.7   . CHOLECYSTECTOMY    . COLON SURGERY    . COLONOSCOPY    . COLONOSCOPY N/A 08/31/2012   Procedure: COLONOSCOPY;  Surgeon: Inda Castle, MD;  [facility]: [facility];  Service: Endoscopy;  Laterality: N/A;  . ESOPHAGOGASTRODUODENOSCOPY N/A 07/06/2012   Procedure: ESOPHAGOGASTRODUODENOSCOPY (EGD);  Surgeon: Lafayette Dragon, MD;  Location: [facility];  Service: Endoscopy;  Laterality: N/A;  . KNEE SURGERY Right   . MIDDLE EAR SURGERY    . SAVORY DILATION N/A 07/06/2012   Procedure: SAVORY DILATION;  Surgeon: Lafayette Dragon, MD;  [facility];  Service: Endoscopy;  Laterality: N/A;  . WRIST SURGERY Right     Current Outpatient Prescriptions  Medication Sig Dispense Refill  . albuterol (PROVENTIL HFA) 108 (90 BASE) MCG/ACT inhaler Inhale 2 puffs into the lungs every 6 (six) hours as needed.     Marland Kitchen allopurinol (ZYLOPRIM) 300 MG tablet Take 450 mg by mouth daily.    Marland Kitchen aspirin 81 MG EC tablet Take 81 mg by mouth daily.      . carvedilol (COREG) 25 MG tablet Take 1 tablet (25 mg total) by mouth as directed. Take 1 tablet in the morning and 1/2 tablet in the evening 45 tablet 11  . cyclobenzaprine (FLEXERIL) 10 MG tablet Take 10 mg by mouth 3 (three) times daily as needed for muscle spasms.     . digoxin (LANOXIN) 0.125 MG tablet Take 1 tablet (125 mcg total) by mouth  every other day.    . docusate sodium (COLACE) 100 MG capsule One to three tablets daily    . furosemide (LASIX) 40 MG tablet Take 1 tablet (40 mg total) by mouth 2 (two) times daily. 60 tablet 6  . indomethacin (INDOCIN) 50 MG capsule Take 50 mg by mouth 3 (three) times daily with meals.    Marland Kitchen ipratropium-albuterol (DUONEB) 0.5-2.5 (3) MG/3ML SOLN Take 3 mLs by nebulization every 4 (four) hours as needed (for wheezing).     Marland Kitchen levothyroxine (SYNTHROID, LEVOTHROID) 88 MCG tablet Take 112 mcg by mouth every morning.     . loratadine (CLARITIN) 10 MG tablet Take 10 mg by mouth daily.    Marland Kitchen losartan (COZAAR) 50 MG tablet Take 25 mg by mouth daily.    Marland Kitchen omeprazole (PRILOSEC) 20 MG capsule Take 20 mg by mouth every morning.     . pregabalin (LYRICA) 150 MG capsule Take 150 mg by mouth 2 (two) times daily.    Marland Kitchen tiotropium (SPIRIVA) 18 MCG inhalation capsule Place 18 mcg into inhaler and inhale daily.     . traMADol (ULTRAM) 50 MG tablet Take by mouth every 6 (six) hours as needed.    . venlafaxine (EFFEXOR-XR) 37.5 MG 24 hr capsule Take 75 mg by mouth daily.     . vitamin B-12 (CYANOCOBALAMIN) 1000 MCG tablet 1,000 mcg. One tablet by mouth twice daily     No current facility-administered medications for this visit.     Allergies:   Codeine and Contrast media [iodinated diagnostic agents]   Social History:  The patient  reports that she quit smoking about 14 years ago. Her smoking use included Cigarettes. She has a 60.00 pack-year smoking history. She has never used smokeless tobacco. She reports that she does not drink alcohol or use drugs.   Family History:  The patient's family history includes Breast cancer in her mother; Colon cancer (age of onset: 11) in her brother; Heart attack in her sister and sister; Heart disease in her father; Hypertension in her brother and mother; Stroke in her mother.  ROS:  Please see the history of present illness.    All other systems are reviewed and otherwise  negative.   PHYSICAL EXAM:  VS:  BP 126/72   Pulse 98   Ht 5\' 3"  (1.6 m)   Wt 223 lb (101.2 kg)   SpO2 94%   BMI 39.50 kg/m  BMI: Body mass index is 39.5 kg/m. Well nourished, well developed, in no acute distress  HEENT: normocephalic, atraumatic  Neck: no JVD, carotid bruits or masses Cardiac: RRR; no significant murmurs, no rubs, or gallops Lungs:  Diminished slightly at the bases, CTA b/l, no wheezing, rhonchi or rales  Abd: soft, nontender MS: no deformity or atrophy Ext:  no edema  Skin: warm and dry, no rash Neuro:  No gross deficits appreciated Psych: euthymic mood, full affect  ICD site is stable, no tethering or discomfort   EKG:  Done today and reviewed by myself shows SR, V paced 98bpm, QRS 110ms ICD interrogation done today by industry and reviewed by myself: battery reached ERI 09/05/16, lead measurements are good. 98% BiVe paced, no VF/AF, on AMS episode is ST  Dr. Caryl Comes noted: Catheterization 2010 demonstrated an ejection fraction of 15-20% without obstructive coronary disease   03/12/05: TTE SUMMARY - Left ventricular ejection fraction was estimated , range being 55    % to 65 %. This study was inadequate for the evaluation of    left ventricular regional wall motion. - Very limited study with poor sound transmission. LV and RV appear    grossly normal without any obvious valvular abnormalities.    Suggest TEE or MUGA to further evaluate, if clinically    indicated. IMPRESSIONS - Very limited study with poor sound transmission. LV and RV appear    grossly normal without any obvious valvular abnormalities.    Suggest TEE or MUGA to further evaluate, if clinically    indicated.  Recent Labs: 03/28/2016: ALT 12; BUN 13; Creatinine, Ser 1.04; Hemoglobin 12.0; Platelets 431; Potassium 4.7; Sodium 142; TSH 1.020  No results found for requested labs within last 8760 hours.   CrCl cannot be calculated (Patient's most recent  lab result is older than the maximum 21 days allowed.).   Wt Readings from Last 3 Encounters:  09/10/16 223 lb (101.2 kg)  03/28/16 206 lb 6.4 oz (93.6 kg)  03/23/15 200 lb 12.8 oz (91.1 kg)     Other studies reviewed: Additional studies/records reviewed today include: summarized above  ASSESSMENT AND PLAN:  1. CRT-D     at [facility]  We discussed generator change procedure, risks, benefits, she would like to proceed.  The patient's husband requests the procedure be done at [facility] and is certain that it can be arranged. I told him that it would need to be [facility], they are agreeable to that facility only if there is no way it can be [facility].  They also mention having an echo done a year or 2 ago by another cardiologist with improved hert function back to normal.  I suggest a we do an echo ahead of time, and perhaps if in-fact normalized LVEF might consider replacing with CRT-P, though they would like an ICD.  Will order the echo.  2. NICM     On BB, ARB, diuretic, dig     03/28/16 Dig level was 1.1  Her weight is up, CorVue suggests fluid OL, she is not edmeatous but husband observs she appears more SOB with ambulation then usual,   Instructed to takle lasix TID for 4 days then resume BID, the patient reluctantly admits to a fair amount of salty foods, and she is educated/cautioned on salt/sodium intake.  3. HTN     Looks OK, no changes  Disposition: F/u routine post gen change appointments  Current medicines are reviewed at length with the patient today.  The patient did not have any concerns regarding medicines.  Haywood Lasso, PA-C 09/10/2016 2:13 PM     [place] [id number] [phone] (office)  [phone] (fax)

## 2016-09-11 LAB — CBC WITH DIFFERENTIAL/PLATELET
BASOS: 0 %
Basophils Absolute: 0 10*3/uL (ref 0.0–0.2)
EOS (ABSOLUTE): 0.3 10*3/uL (ref 0.0–0.4)
Eos: 2 %
Hematocrit: 36.2 % (ref 34.0–46.6)
Hemoglobin: 10.9 g/dL — ABNORMAL LOW (ref 11.1–15.9)
IMMATURE GRANULOCYTES: 0 %
Immature Grans (Abs): 0 10*3/uL (ref 0.0–0.1)
Lymphocytes Absolute: 2.5 10*3/uL (ref 0.7–3.1)
Lymphs: 21 %
MCH: 25.2 pg — ABNORMAL LOW (ref 26.6–33.0)
MCHC: 30.1 g/dL — AB (ref 31.5–35.7)
MCV: 84 fL (ref 79–97)
MONOS ABS: 0.9 10*3/uL (ref 0.1–0.9)
Monocytes: 7 %
Neutrophils Absolute: 7.9 10*3/uL — ABNORMAL HIGH (ref 1.4–7.0)
Neutrophils: 70 %
Platelets: 455 10*3/uL — ABNORMAL HIGH (ref 150–379)
RBC: 4.33 x10E6/uL (ref 3.77–5.28)
RDW: 17 % — AB (ref 12.3–15.4)
WBC: 11.6 10*3/uL — ABNORMAL HIGH (ref 3.4–10.8)

## 2016-09-11 LAB — BASIC METABOLIC PANEL
BUN/Creatinine Ratio: 14 (ref 12–28)
BUN: 13 mg/dL (ref 8–27)
CO2: 31 mmol/L — ABNORMAL HIGH (ref 20–29)
Calcium: 9.2 mg/dL (ref 8.7–10.3)
Chloride: 96 mmol/L (ref 96–106)
Creatinine, Ser: 0.93 mg/dL (ref 0.57–1.00)
GFR calc Af Amer: 74 mL/min/{1.73_m2} (ref >59)
GFR calc non Af Amer: 64 mL/min/{1.73_m2} (ref >59)
GLUCOSE: 106 mg/dL — AB (ref 65–99)
Potassium: 4.1 mmol/L (ref 3.5–5.2)
SODIUM: 145 mmol/L — AB (ref 134–144)

## 2016-09-11 LAB — PROTIME-INR
INR: 0.9 (ref 0.8–1.2)
Prothrombin Time: 9.7 s (ref 9.1–12.0)

## 2016-09-17 ENCOUNTER — Ambulatory Visit (HOSPITAL_COMMUNITY)
Admission: RE | Admit: 2016-09-17 | Discharge: 2016-09-17 | Disposition: A | Discharge status: Home / Self Care | Payer: Medicare Other | Source: Ambulatory Visit | Attending: Internal Medicine | Admitting: Internal Medicine

## 2016-09-17 ENCOUNTER — Other Ambulatory Visit: Payer: Self-pay | Admitting: Internal Medicine

## 2016-09-17 ENCOUNTER — Encounter (HOSPITAL_COMMUNITY): Payer: Self-pay | Admitting: Internal Medicine

## 2016-09-17 ENCOUNTER — Encounter (HOSPITAL_COMMUNITY)
Admission: RE | Disposition: A | Discharge status: Home / Self Care | Payer: Self-pay | Source: Ambulatory Visit | Attending: Internal Medicine

## 2016-09-17 DIAGNOSIS — I5022 Chronic systolic (congestive) heart failure: Secondary | ICD-10-CM | POA: Diagnosis not present

## 2016-09-17 DIAGNOSIS — Z87891 Personal history of nicotine dependence: Secondary | ICD-10-CM | POA: Insufficient documentation

## 2016-09-17 DIAGNOSIS — G4730 Sleep apnea, unspecified: Secondary | ICD-10-CM | POA: Insufficient documentation

## 2016-09-17 DIAGNOSIS — E039 Hypothyroidism, unspecified: Secondary | ICD-10-CM | POA: Insufficient documentation

## 2016-09-17 DIAGNOSIS — Z7982 Long term (current) use of aspirin: Secondary | ICD-10-CM | POA: Diagnosis not present

## 2016-09-17 DIAGNOSIS — I110 Hypertensive heart disease with heart failure: Secondary | ICD-10-CM | POA: Insufficient documentation

## 2016-09-17 DIAGNOSIS — H9192 Unspecified hearing loss, left ear: Secondary | ICD-10-CM | POA: Diagnosis not present

## 2016-09-17 DIAGNOSIS — Z45010 Encounter for checking and testing of cardiac pacemaker pulse generator [battery]: Secondary | ICD-10-CM | POA: Diagnosis present

## 2016-09-17 DIAGNOSIS — Z9981 Dependence on supplemental oxygen: Secondary | ICD-10-CM | POA: Diagnosis not present

## 2016-09-17 DIAGNOSIS — Z8249 Family history of ischemic heart disease and other diseases of the circulatory system: Secondary | ICD-10-CM | POA: Diagnosis not present

## 2016-09-17 DIAGNOSIS — J449 Chronic obstructive pulmonary disease, unspecified: Secondary | ICD-10-CM | POA: Diagnosis not present

## 2016-09-17 DIAGNOSIS — Z006 Encounter for examination for normal comparison and control in clinical research program: Secondary | ICD-10-CM | POA: Insufficient documentation

## 2016-09-17 DIAGNOSIS — Z4502 Encounter for adjustment and management of automatic implantable cardiac defibrillator: Secondary | ICD-10-CM | POA: Diagnosis not present

## 2016-09-17 DIAGNOSIS — I429 Cardiomyopathy, unspecified: Secondary | ICD-10-CM | POA: Insufficient documentation

## 2016-09-17 DIAGNOSIS — Z79899 Other long term (current) drug therapy: Secondary | ICD-10-CM | POA: Insufficient documentation

## 2016-09-17 HISTORY — PX: BIV ICD GENERATOR CHANGEOUT: EP1194

## 2016-09-17 LAB — SURGICAL PCR SCREEN
MRSA, PCR: NEGATIVE
STAPHYLOCOCCUS AUREUS: NEGATIVE

## 2016-09-17 SURGERY — BIV ICD GENERATOR CHANGEOUT
Anesthesia: LOCAL

## 2016-09-17 MED ORDER — ONDANSETRON HCL 4 MG/2ML IJ SOLN: 4.0000 mg | Freq: Four times a day (QID) | INTRAMUSCULAR | Status: DC | PRN

## 2016-09-17 MED ORDER — SODIUM CHLORIDE 0.9 % IV SOLN: INTRAVENOUS | Status: AC

## 2016-09-17 MED ORDER — MIDAZOLAM HCL 5 MG/5ML IJ SOLN
INTRAMUSCULAR | Status: AC
  Filled 2016-09-17: qty 5

## 2016-09-17 MED ORDER — MUPIROCIN 2 % EX OINT
1.0000 "application " | TOPICAL_OINTMENT | Freq: Once | CUTANEOUS | Status: AC
  Administered 2016-09-17: 1 via TOPICAL

## 2016-09-17 MED ORDER — CEFAZOLIN SODIUM-DEXTROSE 2-4 GM/100ML-% IV SOLN
2.0000 g | INTRAVENOUS | Status: AC
  Administered 2016-09-17: 2 g via INTRAVENOUS
  Filled 2016-09-17: qty 100

## 2016-09-17 MED ORDER — FENTANYL CITRATE (PF) 100 MCG/2ML IJ SOLN
INTRAMUSCULAR | Status: AC
  Filled 2016-09-17: qty 2

## 2016-09-17 MED ORDER — LIDOCAINE HCL (PF) 1 % IJ SOLN
INTRAMUSCULAR | Status: AC
  Filled 2016-09-17: qty 60

## 2016-09-17 MED ORDER — LIDOCAINE HCL (PF) 1 % IJ SOLN
INTRAMUSCULAR | Status: DC | PRN
  Administered 2016-09-17: 45 mL

## 2016-09-17 MED ORDER — MUPIROCIN 2 % EX OINT
TOPICAL_OINTMENT | CUTANEOUS | Status: AC
  Administered 2016-09-17: 1 via TOPICAL
  Filled 2016-09-17: qty 22

## 2016-09-17 MED ORDER — CHLORHEXIDINE GLUCONATE 4 % EX LIQD
60.0000 mL | Freq: Once | CUTANEOUS | Status: DC
  Filled 2016-09-17: qty 60

## 2016-09-17 MED ORDER — SODIUM CHLORIDE 0.9 % IR SOLN
80.0000 mg | Status: AC
  Administered 2016-09-17: 80 mg
  Filled 2016-09-17: qty 2

## 2016-09-17 MED ORDER — SODIUM CHLORIDE 0.9 % IV SOLN
INTRAVENOUS | Status: DC
  Administered 2016-09-17: 08:00:00 via INTRAVENOUS

## 2016-09-17 MED ORDER — ACETAMINOPHEN 325 MG PO TABS: 325.0000 mg | ORAL_TABLET | ORAL | Status: DC | PRN

## 2016-09-17 MED ORDER — CEFAZOLIN SODIUM-DEXTROSE 2-4 GM/100ML-% IV SOLN
INTRAVENOUS | Status: AC
  Filled 2016-09-17: qty 100

## 2016-09-17 MED ORDER — GENTAMICIN SULFATE 40 MG/ML IJ SOLN
INTRAMUSCULAR | Status: AC
  Filled 2016-09-17: qty 2

## 2016-09-17 MED ORDER — DIGOXIN 125 MCG PO TABS: 125.0000 ug | ORAL_TABLET | ORAL | 11 refills | Status: DC

## 2016-09-17 SURGICAL SUPPLY — 3 items
HEMOSTAT SURGICEL 2X4 FIBR (HEMOSTASIS) ×1 IMPLANT
ICD UNIFY ASURA CRT CD3357-40C (ICD Generator) ×1 IMPLANT
TRAY PACEMAKER INSERTION (PACKS) ×1 IMPLANT

## 2016-09-17 NOTE — Discharge Instructions (Signed)

## 2016-09-17 NOTE — Interval H&P Note (Signed)
History and Physical Interval Note:  09/17/2016 9:56 AM  Stacy Grimes  has presented today for surgery, with the diagnosis of ppm eri  The various methods of treatment have been discussed with the patient and family. After consideration of risks, benefits and other options for treatment, the patient has consented to  Procedure(s): BiV ICD Generator Changeout (N/A) as a surgical intervention .  The patient's history has been reviewed, patient examined, no change in status, stable for surgery.  I have reviewed the patient's chart and labs.  Questions were answered to the patient's satisfaction.     Virl Axe

## 2016-09-17 NOTE — H&P (View-Only) (Signed)
Cardiology Office Note Date:  09/10/2016  Patient ID:  Stacy, Grimes Jan 17, 1951, MRN [id number] PCP:  Cyndi Bender, PA-C  Cardiologist:  Dr. Caryl Comes   Chief Complaint: device at ERI  History of Present Illness: Stacy Grimes is a 66 y.o. female with history of NICM, ICD, COPD chronically on O2, chronic CHF (systolic), HTN, hypothyroidism, sleep apnea, comes in today to be seen for Dr. Caryl Comes, last seen by him in Feb.  At that time she was doing well, noting she was nearing ERI.  She comes today accompanied by her husband.  The patient reports feeling at what she considers to be her baseline.  She is chronically SOB even at rest to some degree on chronic O2, she has DOE that she does not perceive to be unusual though her husband thinks is more then usual in the last couple weeks.  She has a chronic sharp/shooting type CP that goes back 8-9 years, is not exertional or positional, quite random without change to his behavior over the years and they report has been felt to be musculoskeletal.  No palpitations, no near syncope or syncope.  She will be fleetingly lightheaded if she stands to quickly   Device information: SJM CRT-D, implanted 04/12/09, Dr. Caryl Comes, [place]   Past Medical History:  Diagnosis Date  . AAA (abdominal aortic aneurysm) ([place])   . Anginal pain ([place])   . Anxiety   . Arthritis   . Automatic implantable cardioverter-defibrillator in situ   . Biventricular ICD  [employer]   . CHF (congestive heart failure) ([place])   . Chronic systolic heart failure ([place])   . COPD (chronic obstructive pulmonary disease) ([place])   . Deafness    left ear  . Depression   . Dizziness   . Dysrhythmia   . Fracture    history of spinal fracture  . GERD (gastroesophageal reflux disease)   . Hypertension   . Hypothyroidism   . Non-ischemic cardiomyopathy ([place])   . Oxygen dependent   . Palpitations   . Shortness of breath dyspnea   . Sleep apnea   . Wheezing     Past Surgical History:    Procedure Laterality Date  . ABDOMINAL HYSTERECTOMY    . BLADDER SURGERY    . CARDIAC CATHETERIZATION    . CARDIAC DEFIBRILLATOR PLACEMENT  4 yrs ago   pacemaker  . CATARACT EXTRACTION W/PHACO Left 09/01/2014   Procedure: CATARACT EXTRACTION PHACO AND INTRAOCULAR LENS PLACEMENT (IOC);  Surgeon: Lyla Glassing, MD;  Location: [facility];  Service: Ophthalmology;  Laterality: Left;  [place]: 1:12.1   . CATARACT EXTRACTION W/PHACO Right 09/29/2014   Procedure: CATARACT EXTRACTION PHACO AND INTRAOCULAR LENS PLACEMENT (IOC);  Surgeon: Lyla Glassing, MD;  Location: [facility];  Service: Ophthalmology;  Laterality: Right;  [place]: 00:52.7   . CHOLECYSTECTOMY    . COLON SURGERY    . COLONOSCOPY    . COLONOSCOPY N/A 08/31/2012   Procedure: COLONOSCOPY;  Surgeon: Inda Castle, MD;  Location: [facility];  Service: Endoscopy;  Laterality: N/A;  . ESOPHAGOGASTRODUODENOSCOPY N/A 07/06/2012   Procedure: ESOPHAGOGASTRODUODENOSCOPY (EGD);  Surgeon: Lafayette Dragon, MD;  Location: [facility];  Service: Endoscopy;  Laterality: N/A;  . KNEE SURGERY Right   . MIDDLE EAR SURGERY    . SAVORY DILATION N/A 07/06/2012   Procedure: SAVORY DILATION;  Surgeon: Lafayette Dragon, MD;  [facility];  Service: Endoscopy;  Laterality: N/A;  . WRIST SURGERY Right     Current Outpatient Prescriptions  Medication Sig Dispense Refill  . albuterol (PROVENTIL HFA) 108 (90 BASE) MCG/ACT inhaler Inhale 2 puffs into the lungs every 6 (six) hours as needed.     Marland Kitchen allopurinol (ZYLOPRIM) 300 MG tablet Take 450 mg by mouth daily.    Marland Kitchen aspirin 81 MG EC tablet Take 81 mg by mouth daily.      . carvedilol (COREG) 25 MG tablet Take 1 tablet (25 mg total) by mouth as directed. Take 1 tablet in the morning and 1/2 tablet in the evening 45 tablet 11  . cyclobenzaprine (FLEXERIL) 10 MG tablet Take 10 mg by mouth 3 (three) times daily as needed for muscle spasms.     . digoxin (LANOXIN) 0.125 MG tablet Take 1 tablet (125 mcg total) by mouth  every other day.    . docusate sodium (COLACE) 100 MG capsule One to three tablets daily    . furosemide (LASIX) 40 MG tablet Take 1 tablet (40 mg total) by mouth 2 (two) times daily. 60 tablet 6  . indomethacin (INDOCIN) 50 MG capsule Take 50 mg by mouth 3 (three) times daily with meals.    Marland Kitchen ipratropium-albuterol (DUONEB) 0.5-2.5 (3) MG/3ML SOLN Take 3 mLs by nebulization every 4 (four) hours as needed (for wheezing).     Marland Kitchen levothyroxine (SYNTHROID, LEVOTHROID) 88 MCG tablet Take 112 mcg by mouth every morning.     . loratadine (CLARITIN) 10 MG tablet Take 10 mg by mouth daily.    Marland Kitchen losartan (COZAAR) 50 MG tablet Take 25 mg by mouth daily.    Marland Kitchen omeprazole (PRILOSEC) 20 MG capsule Take 20 mg by mouth every morning.     . pregabalin (LYRICA) 150 MG capsule Take 150 mg by mouth 2 (two) times daily.    Marland Kitchen tiotropium (SPIRIVA) 18 MCG inhalation capsule Place 18 mcg into inhaler and inhale daily.     . traMADol (ULTRAM) 50 MG tablet Take by mouth every 6 (six) hours as needed.    . venlafaxine (EFFEXOR-XR) 37.5 MG 24 hr capsule Take 75 mg by mouth daily.     . vitamin B-12 (CYANOCOBALAMIN) 1000 MCG tablet 1,000 mcg. One tablet by mouth twice daily     No current facility-administered medications for this visit.     Allergies:   Codeine and Contrast media [iodinated diagnostic agents]   Social History:  The patient  reports that she quit smoking about 14 years ago. Her smoking use included Cigarettes. She has a 60.00 pack-year smoking history. She has never used smokeless tobacco. She reports that she does not drink alcohol or use drugs.   Family History:  The patient's family history includes Breast cancer in her mother; Colon cancer (age of onset: 23) in her brother; Heart attack in her sister and sister; Heart disease in her father; Hypertension in her brother and mother; Stroke in her mother.  ROS:  Please see the history of present illness.    All other systems are reviewed and otherwise  negative.   PHYSICAL EXAM:  VS:  BP 126/72   Pulse 98   Ht 5\' 3"  (1.6 m)   Wt 223 lb (101.2 kg)   SpO2 94%   BMI 39.50 kg/m  BMI: Body mass index is 39.5 kg/m. Well nourished, well developed, in no acute distress  HEENT: normocephalic, atraumatic  Neck: no JVD, carotid bruits or masses Cardiac: RRR; no significant murmurs, no rubs, or gallops Lungs:  Diminished slightly at the bases, CTA b/l, no wheezing, rhonchi or rales  Abd: soft, nontender MS: no deformity or atrophy Ext:  no edema  Skin: warm and dry, no rash Neuro:  No gross deficits appreciated Psych: euthymic mood, full affect  ICD site is stable, no tethering or discomfort   EKG:  Done today and reviewed by myself shows SR, V paced 98bpm, QRS 139ms ICD interrogation done today by industry and reviewed by myself: battery reached ERI 09/05/16, lead measurements are good. 98% BiVe paced, no VF/AF, on AMS episode is ST  Dr. Caryl Comes noted: Catheterization 2010 demonstrated an ejection fraction of 15-20% without obstructive coronary disease   03/12/05: TTE SUMMARY - Left ventricular ejection fraction was estimated , range being 55    % to 65 %. This study was inadequate for the evaluation of    left ventricular regional wall motion. - Very limited study with poor sound transmission. LV and RV appear    grossly normal without any obvious valvular abnormalities.    Suggest TEE or MUGA to further evaluate, if clinically    indicated. IMPRESSIONS - Very limited study with poor sound transmission. LV and RV appear    grossly normal without any obvious valvular abnormalities.    Suggest TEE or MUGA to further evaluate, if clinically    indicated.  Recent Labs: 03/28/2016: ALT 12; BUN 13; Creatinine, Ser 1.04; Hemoglobin 12.0; Platelets 431; Potassium 4.7; Sodium 142; TSH 1.020  No results found for requested labs within last 8760 hours.   CrCl cannot be calculated (Patient's most recent  lab result is older than the maximum 21 days allowed.).   Wt Readings from Last 3 Encounters:  09/10/16 223 lb (101.2 kg)  03/28/16 206 lb 6.4 oz (93.6 kg)  03/23/15 200 lb 12.8 oz (91.1 kg)     Other studies reviewed: Additional studies/records reviewed today include: summarized above  ASSESSMENT AND PLAN:  1. CRT-D     at [facility]  We discussed generator change procedure, risks, benefits, she would like to proceed.  The patient's husband requests the procedure be done at [facility] and is certain that it can be arranged. I told him that it would need to be [facility], they are agreeable to that facility only if there is no way it can be [facility].  They also mention having an echo done a year or 2 ago by another cardiologist with improved hert function back to normal.  I suggest a we do an echo ahead of time, and perhaps if in-fact normalized LVEF might consider replacing with CRT-P, though they would like an ICD.  Will order the echo.  2. NICM     On BB, ARB, diuretic, dig     03/28/16 Dig level was 1.1  Her weight is up, CorVue suggests fluid OL, she is not edmeatous but husband observs she appears more SOB with ambulation then usual,   Instructed to takle lasix TID for 4 days then resume BID, the patient reluctantly admits to a fair amount of salty foods, and she is educated/cautioned on salt/sodium intake.  3. HTN     Looks OK, no changes  Disposition: F/u routine post gen change appointments  Current medicines are reviewed at length with the patient today.  The patient did not have any concerns regarding medicines.  Haywood Lasso, PA-C 09/10/2016 2:13 PM     [address] [place] [address] [phone] (office)  [phone] (fax)

## 2016-09-18 ENCOUNTER — Other Ambulatory Visit (HOSPITAL_COMMUNITY): Payer: Medicare Other

## 2016-09-22 LAB — AEROBIC/ANAEROBIC CULTURE W GRAM STAIN (SURGICAL/DEEP WOUND): Culture: NO GROWTH

## 2016-09-22 LAB — AEROBIC/ANAEROBIC CULTURE (SURGICAL/DEEP WOUND)

## 2016-09-26 ENCOUNTER — Other Ambulatory Visit (HOSPITAL_COMMUNITY): Payer: Medicare Other

## 2016-09-27 ENCOUNTER — Telehealth: Payer: Self-pay | Admitting: Cardiology

## 2016-09-27 NOTE — Telephone Encounter (Signed)
Called patient b/c her home monitor has not updated in at least 7 days. Pt no longer has the [employer] Device. She had a gen change on 09-17-16.

## 2016-09-30 ENCOUNTER — Ambulatory Visit (HOSPITAL_COMMUNITY): Payer: Medicare Other | Attending: Cardiology

## 2016-09-30 ENCOUNTER — Ambulatory Visit (INDEPENDENT_AMBULATORY_CARE_PROVIDER_SITE_OTHER): Payer: Medicare Other | Admitting: *Deleted

## 2016-09-30 DIAGNOSIS — I429 Cardiomyopathy, unspecified: Secondary | ICD-10-CM | POA: Diagnosis not present

## 2016-09-30 DIAGNOSIS — I5022 Chronic systolic (congestive) heart failure: Secondary | ICD-10-CM | POA: Diagnosis not present

## 2016-09-30 DIAGNOSIS — Z95810 Presence of automatic (implantable) cardiac defibrillator: Secondary | ICD-10-CM | POA: Insufficient documentation

## 2016-09-30 DIAGNOSIS — G4733 Obstructive sleep apnea (adult) (pediatric): Secondary | ICD-10-CM | POA: Diagnosis not present

## 2016-09-30 DIAGNOSIS — I110 Hypertensive heart disease with heart failure: Secondary | ICD-10-CM | POA: Insufficient documentation

## 2016-09-30 DIAGNOSIS — I428 Other cardiomyopathies: Secondary | ICD-10-CM | POA: Diagnosis not present

## 2016-09-30 DIAGNOSIS — Z87891 Personal history of nicotine dependence: Secondary | ICD-10-CM | POA: Diagnosis not present

## 2016-09-30 LAB — CUP PACEART INCLINIC DEVICE CHECK
Brady Statistic RV Percent Paced: 98 %
Date Time Interrogation Session: 20180827163237
HighPow Impedance: 58.6386
Implantable Lead Implant Date: 20110309
Implantable Lead Implant Date: 20110309
Implantable Lead Location: 753858
Implantable Lead Location: 753860
Lead Channel Impedance Value: 612.5 Ohm
Lead Channel Pacing Threshold Amplitude: 0.5 V
Lead Channel Pacing Threshold Amplitude: 0.5 V
Lead Channel Pacing Threshold Amplitude: 0.75 V
Lead Channel Pacing Threshold Pulse Width: 0.5 ms
Lead Channel Pacing Threshold Pulse Width: 0.5 ms
Lead Channel Setting Pacing Amplitude: 2 V
Lead Channel Setting Pacing Amplitude: 2.5 V
Lead Channel Setting Sensing Sensitivity: 0.5 mV
MDC IDC LEAD IMPLANT DT: 20110309
MDC IDC LEAD LOCATION: 753859
MDC IDC MSMT BATTERY REMAINING LONGEVITY: 78 mo
MDC IDC MSMT LEADCHNL LV IMPEDANCE VALUE: 725 Ohm
MDC IDC MSMT LEADCHNL LV PACING THRESHOLD AMPLITUDE: 0.75 V
MDC IDC MSMT LEADCHNL LV PACING THRESHOLD PULSEWIDTH: 0.5 ms
MDC IDC MSMT LEADCHNL RA IMPEDANCE VALUE: 375 Ohm
MDC IDC MSMT LEADCHNL RA PACING THRESHOLD AMPLITUDE: 1 V
MDC IDC MSMT LEADCHNL RA PACING THRESHOLD AMPLITUDE: 1 V
MDC IDC MSMT LEADCHNL RA PACING THRESHOLD PULSEWIDTH: 0.5 ms
MDC IDC MSMT LEADCHNL RA PACING THRESHOLD PULSEWIDTH: 0.5 ms
MDC IDC MSMT LEADCHNL RA SENSING INTR AMPL: 5 mV
MDC IDC MSMT LEADCHNL RV PACING THRESHOLD PULSEWIDTH: 0.5 ms
MDC IDC MSMT LEADCHNL RV SENSING INTR AMPL: 11.7 mV
MDC IDC PG IMPLANT DT: 20180814
MDC IDC SET LEADCHNL LV PACING PULSEWIDTH: 0.5 ms
MDC IDC SET LEADCHNL RA PACING AMPLITUDE: 2.25 V
MDC IDC SET LEADCHNL RV PACING PULSEWIDTH: 0.5 ms
MDC IDC STAT BRADY RA PERCENT PACED: 0.04 %
Pulse Gen Serial Number: 9761609

## 2016-09-30 MED ORDER — PERFLUTREN LIPID MICROSPHERE
1.0000 mL | INTRAVENOUS | Status: AC | PRN
  Administered 2016-09-30: 2 mL via INTRAVENOUS

## 2016-09-30 NOTE — Progress Notes (Signed)
Wound check appointment. Dermabond removed. Wound without redness or edema. Incision edges approximated, wound well healed. Normal device function. Thresholds, sensing, and impedances consistent with implant measurements. Device programmed at chronic values s/p gen change. Histogram distribution appropriate for patient and level of activity. 4 mode switches  (<1%) EGMs show atach. No ventricular arrhythmias noted. ROV with SK 11/19

## 2016-10-02 LAB — CUP PACEART REMOTE DEVICE CHECK
Date Time Interrogation Session: 20180829100936
Lead Channel Setting Pacing Amplitude: 2 V
Lead Channel Setting Pacing Amplitude: 2.5 V
MDC IDC PG IMPLANT DT: 20180814
MDC IDC PG SERIAL: 9761609
MDC IDC SET LEADCHNL LV PACING AMPLITUDE: 1.75 V
MDC IDC SET LEADCHNL LV PACING PULSEWIDTH: 0.5 ms
MDC IDC SET LEADCHNL RV PACING PULSEWIDTH: 0.5 ms
MDC IDC SET LEADCHNL RV SENSING SENSITIVITY: 0.5 mV

## 2016-10-03 ENCOUNTER — Other Ambulatory Visit: Payer: Self-pay | Admitting: Internal Medicine

## 2016-10-03 ENCOUNTER — Telehealth: Payer: Self-pay | Admitting: Internal Medicine

## 2016-10-03 ENCOUNTER — Telehealth: Payer: Self-pay | Admitting: *Deleted

## 2016-10-03 NOTE — Telephone Encounter (Signed)
-----   Message from [place], [place] sent at 10/01/2016  7:23 PM EDT ----- Please let the patient know that the test was technically limited (difficulty getting clear images) her heart muscle function still looks to be OK.  If she is feeling well, or at her "normal" baseline, no need to do any further.  Thanks [employer]

## 2016-10-03 NOTE — Telephone Encounter (Signed)
LMOVM TO CALL BACK

## 2016-10-03 NOTE — Telephone Encounter (Signed)
New message       Calling to ask nurse to send echo results in mail

## 2016-10-08 ENCOUNTER — Telehealth: Payer: Self-pay | Admitting: *Deleted

## 2016-10-08 NOTE — Telephone Encounter (Signed)
-----   Message from [place], [place] sent at 10/01/2016  7:23 PM EDT ----- Please let the patient know that the test was technically limited (difficulty getting clear images) her heart muscle function still looks to be OK.  If she is feeling well, or at her "normal" baseline, no need to do any further.  Thanks [employer]

## 2016-10-08 NOTE — Telephone Encounter (Signed)
SPOKE TO HUSBAND AND PT IS AWARE OF RESULTS

## 2016-10-15 DIAGNOSIS — Z79899 Other long term (current) drug therapy: Secondary | ICD-10-CM | POA: Diagnosis not present

## 2016-10-15 DIAGNOSIS — J449 Chronic obstructive pulmonary disease, unspecified: Secondary | ICD-10-CM | POA: Diagnosis not present

## 2016-10-15 DIAGNOSIS — I10 Essential (primary) hypertension: Secondary | ICD-10-CM | POA: Diagnosis not present

## 2016-10-15 DIAGNOSIS — J9610 Chronic respiratory failure, unspecified whether with hypoxia or hypercapnia: Secondary | ICD-10-CM | POA: Diagnosis not present

## 2016-10-15 DIAGNOSIS — F418 Other specified anxiety disorders: Secondary | ICD-10-CM | POA: Diagnosis not present

## 2016-10-15 DIAGNOSIS — M109 Gout, unspecified: Secondary | ICD-10-CM | POA: Diagnosis not present

## 2016-10-15 DIAGNOSIS — M545 Low back pain: Secondary | ICD-10-CM | POA: Diagnosis not present

## 2016-10-15 DIAGNOSIS — E039 Hypothyroidism, unspecified: Secondary | ICD-10-CM | POA: Diagnosis not present

## 2016-10-15 DIAGNOSIS — I509 Heart failure, unspecified: Secondary | ICD-10-CM | POA: Diagnosis not present

## 2016-11-02 ENCOUNTER — Emergency Department: Payer: Medicare Other

## 2016-11-02 ENCOUNTER — Emergency Department
Admission: EM | Admit: 2016-11-02 | Discharge: 2016-11-02 | Disposition: A | Discharge status: Home / Self Care | Payer: Medicare Other | Attending: Emergency Medicine | Admitting: Emergency Medicine

## 2016-11-02 DIAGNOSIS — E039 Hypothyroidism, unspecified: Secondary | ICD-10-CM | POA: Insufficient documentation

## 2016-11-02 DIAGNOSIS — I5022 Chronic systolic (congestive) heart failure: Secondary | ICD-10-CM | POA: Insufficient documentation

## 2016-11-02 DIAGNOSIS — Z7982 Long term (current) use of aspirin: Secondary | ICD-10-CM | POA: Diagnosis not present

## 2016-11-02 DIAGNOSIS — I110 Hypertensive heart disease with heart failure: Secondary | ICD-10-CM | POA: Diagnosis not present

## 2016-11-02 DIAGNOSIS — Z79899 Other long term (current) drug therapy: Secondary | ICD-10-CM | POA: Diagnosis not present

## 2016-11-02 DIAGNOSIS — J441 Chronic obstructive pulmonary disease with (acute) exacerbation: Secondary | ICD-10-CM | POA: Insufficient documentation

## 2016-11-02 DIAGNOSIS — Z95810 Presence of automatic (implantable) cardiac defibrillator: Secondary | ICD-10-CM | POA: Insufficient documentation

## 2016-11-02 DIAGNOSIS — Z87891 Personal history of nicotine dependence: Secondary | ICD-10-CM | POA: Diagnosis not present

## 2016-11-02 DIAGNOSIS — R9431 Abnormal electrocardiogram [ECG] [EKG]: Secondary | ICD-10-CM | POA: Diagnosis not present

## 2016-11-02 DIAGNOSIS — R0602 Shortness of breath: Secondary | ICD-10-CM | POA: Diagnosis not present

## 2016-11-02 LAB — CBC
HEMATOCRIT: 33.9 % — AB (ref 35.0–47.0)
HEMOGLOBIN: 10.8 g/dL — AB (ref 12.0–16.0)
MCH: 26.4 pg (ref 26.0–34.0)
MCHC: 31.9 g/dL — AB (ref 32.0–36.0)
MCV: 82.7 fL (ref 80.0–100.0)
Platelets: 356 10*3/uL (ref 150–440)
RBC: 4.09 MIL/uL (ref 3.80–5.20)
RDW: 18.4 % — ABNORMAL HIGH (ref 11.5–14.5)
WBC: 9.6 10*3/uL (ref 3.6–11.0)

## 2016-11-02 LAB — BASIC METABOLIC PANEL
ANION GAP: 11 (ref 5–15)
BUN: 13 mg/dL (ref 6–20)
CO2: 33 mmol/L — ABNORMAL HIGH (ref 22–32)
Calcium: 9.2 mg/dL (ref 8.9–10.3)
Chloride: 97 mmol/L — ABNORMAL LOW (ref 101–111)
Creatinine, Ser: 0.87 mg/dL (ref 0.44–1.00)
GFR calc Af Amer: >60 mL/min (ref >60)
Glucose, Bld: 149 mg/dL — ABNORMAL HIGH (ref 65–99)
POTASSIUM: 3.4 mmol/L — AB (ref 3.5–5.1)
Sodium: 141 mmol/L (ref 135–145)

## 2016-11-02 LAB — TROPONIN I

## 2016-11-02 LAB — DIGOXIN LEVEL: Digoxin Level: 0.8 ng/mL (ref 0.8–2.0)

## 2016-11-02 MED ORDER — ALBUTEROL SULFATE HFA 108 (90 BASE) MCG/ACT IN AERS: 2.0000 | INHALATION_SPRAY | Freq: Four times a day (QID) | RESPIRATORY_TRACT | 0 refills | Status: DC | PRN

## 2016-11-02 MED ORDER — IPRATROPIUM-ALBUTEROL 0.5-2.5 (3) MG/3ML IN SOLN
9.0000 mL | Freq: Once | RESPIRATORY_TRACT | Status: AC
  Administered 2016-11-02: 9 mL via RESPIRATORY_TRACT
  Filled 2016-11-02: qty 6

## 2016-11-02 MED ORDER — DOXYCYCLINE HYCLATE 100 MG PO CAPS: 100.0000 mg | ORAL_CAPSULE | Freq: Two times a day (BID) | ORAL | 0 refills | Status: DC

## 2016-11-02 MED ORDER — AZITHROMYCIN 500 MG PO TABS
500.0000 mg | ORAL_TABLET | Freq: Once | ORAL | Status: DC
  Filled 2016-11-02: qty 1

## 2016-11-02 MED ORDER — METHYLPREDNISOLONE SODIUM SUCC 125 MG IJ SOLR
125.0000 mg | Freq: Once | INTRAMUSCULAR | Status: AC
  Administered 2016-11-02: 125 mg via INTRAVENOUS
  Filled 2016-11-02: qty 2

## 2016-11-02 MED ORDER — DOXYCYCLINE HYCLATE 100 MG PO TABS
100.0000 mg | ORAL_TABLET | Freq: Once | ORAL | Status: AC
  Administered 2016-11-02: 100 mg via ORAL
  Filled 2016-11-02: qty 1

## 2016-11-02 MED ORDER — PREDNISONE 20 MG PO TABS: 60.0000 mg | ORAL_TABLET | Freq: Every day | ORAL | 0 refills | Status: AC

## 2016-11-02 NOTE — ED Provider Notes (Signed)
[facility] Emergency Department Provider Note  ____________________________________________   First MD Initiated Contact with Patient 11/02/16 1703     (approximate)  I have reviewed the triage vital signs and the nursing notes.   HISTORY  Chief Complaint Shortness of Breath    HPI Stacy Grimes is a 66 y.o. female with a history of CHF as well as COPD who is presenting to the emergency department with 1 day of worsening shortness of breath as well as productive cough. She is denying any pain. Her husband is with her and says that he had adjusted her oxygen from 3 L to 4 L in order to keep her breathing managed. She says that she is compliant with her Lasix. Says that especially when she gets up to walk that she becomes acutely short of breath.   Past Medical History:  Diagnosis Date  . AAA (abdominal aortic aneurysm) ([place])   . Anginal pain ([place])   . Anxiety   . Arthritis   . Automatic implantable cardioverter-defibrillator in situ   . Biventricular ICD  [employer]   . CHF (congestive heart failure) ([place])   . Chronic systolic heart failure ([place])   . COPD (chronic obstructive pulmonary disease) ([place])   . Deafness    left ear  . Depression   . Dizziness   . Dysrhythmia   . Fracture    history of spinal fracture  . GERD (gastroesophageal reflux disease)   . Hypertension   . Hypothyroidism   . Non-ischemic cardiomyopathy ([place])   . Oxygen dependent   . Palpitations   . Shortness of breath dyspnea   . Sleep apnea   . Wheezing     Patient Active Problem List   Diagnosis Date Noted  . 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 Diverticulosis of colon (without mention of hemorrhage) 08/31/2012  . Fatigue 08/10/2012  . Personal history of colonic polyps 07/02/2012  . Dysphagia, unspecified(787.20) 07/02/2012  . Unspecified constipation 07/02/2012  . 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 HYPERTENSION,  BENIGN 03/24/2009  . SLEEP APNEA, OBSTRUCTIVE 03/23/2009  . CARDIOMYOPATHY, PRIMARY, DILATED 03/23/2009  . SYSTOLIC HEART FAILURE, CHRONIC 03/23/2009  . PALPITATIONS 03/23/2009  . MURMUR 03/23/2009  . COUGH 03/23/2009    Past Surgical History:  Procedure Laterality Date  . ABDOMINAL HYSTERECTOMY    . BIV ICD GENERATOR CHANGEOUT N/A 09/17/2016   Procedure: BiV ICD Generator Changeout;  Surgeon: Deboraha Sprang, MD;  Location: [place] [facility];  Service: Cardiovascular;  Laterality: N/A;  . BLADDER SURGERY    . CARDIAC CATHETERIZATION    . CARDIAC DEFIBRILLATOR PLACEMENT  4 yrs ago   pacemaker  . CATARACT EXTRACTION W/PHACO Left 09/01/2014   Procedure: CATARACT EXTRACTION PHACO AND INTRAOCULAR LENS PLACEMENT (IOC);  Surgeon: Lyla Glassing, MD;  Location: [facility];  Service: Ophthalmology;  Laterality: Left;  [place]: 1:12.1   . CATARACT EXTRACTION W/PHACO Right 09/29/2014   Procedure: CATARACT EXTRACTION PHACO AND INTRAOCULAR LENS PLACEMENT (IOC);  Surgeon: Lyla Glassing, MD;  Location: [facility];  Service: Ophthalmology;  Laterality: Right;  [place]: 00:52.7   . CHOLECYSTECTOMY    . COLON SURGERY    . COLONOSCOPY    . COLONOSCOPY N/A 08/31/2012   Procedure: COLONOSCOPY;  Surgeon: Inda Castle, MD;  [facility];  Service: Endoscopy;  Laterality: N/A;  . ESOPHAGOGASTRODUODENOSCOPY N/A 07/06/2012   Procedure: ESOPHAGOGASTRODUODENOSCOPY (EGD);  Surgeon: Lafayette Dragon, MD;  Location: [facility];  Service: Endoscopy;  Laterality: N/A;  .  KNEE SURGERY Right   . MIDDLE EAR SURGERY    . SAVORY DILATION N/A 07/06/2012   Procedure: SAVORY DILATION;  Surgeon: Lafayette Dragon, MD;  [facility];  Service: Endoscopy;  Laterality: N/A;  . WRIST SURGERY Right     Prior to Admission medications   Medication Sig Start Date End Date Taking? Authorizing Provider  albuterol (PROVENTIL HFA) 108 (90 BASE) MCG/ACT inhaler Inhale 2 puffs into the lungs every 6 (six) hours as needed for  wheezing or shortness of breath.     [provider]  allopurinol (ZYLOPRIM) 300 MG tablet Take 300 mg by mouth 2 (two) times daily.     [provider]  aspirin 81 MG EC tablet Take 81 mg by mouth daily.      [provider]  carvedilol (COREG) 25 MG tablet Take 1 tablet (25 mg total) by mouth as directed. Take 1 tablet in the morning and 1/2 tablet in the evening Patient taking differently: Take 12.5-25 mg by mouth as directed. Take 12.5 mg by mouth in the morning and 25 mg in the afternoon 08/18/13   Deboraha Sprang, MD  cyclobenzaprine (FLEXERIL) 10 MG tablet Take 10 mg by mouth 3 (three) times daily as needed for muscle spasms.     [provider]  digoxin (LANOXIN) 0.125 MG tablet Take 1 tablet by mouth every other day. 10/03/16   Deboraha Sprang, MD  diphenhydrAMINE (BENADRYL) 25 MG tablet Take 25 mg by mouth at bedtime as needed for sleep.    [provider]  furosemide (LASIX) 40 MG tablet Take 1 tablet (40 mg total) by mouth 2 (two) times daily. 09/10/16   Baldwin [place], PA-C  gabapentin (NEURONTIN) 300 MG capsule Take 300 mg by mouth 2 (two) times daily.    [provider]  guaiFENesin-codeine (ROBITUSSIN AC) 100-10 MG/5ML syrup Take 5 mLs by mouth 3 (three) times daily as needed for cough.    [provider]  hydrOXYzine (VISTARIL) 25 MG capsule Take 25 mg by mouth at bedtime as needed for anxiety (sleep).    [provider]  ipratropium-albuterol (DUONEB) 0.5-2.5 (3) MG/3ML SOLN Take 3 mLs by nebulization every 4 (four) hours as needed (for wheezing).     [provider]  levothyroxine (SYNTHROID, LEVOTHROID) 112 MCG tablet Take 112 mcg by mouth daily.    [provider]  losartan (COZAAR) 50 MG tablet Take 25 mg by mouth daily. 12/17/10   Deboraha Sprang, MD  Melatonin 10 MG TABS Take 10 mg by mouth at bedtime as needed (sleep).    [provider]  Menthol, Topical Analgesic, (BENGAY EX)  Apply 1 application topically daily as needed (back pain).    [provider]  omeprazole (PRILOSEC) 20 MG capsule Take 20 mg by mouth every morning.     [provider]  oxyCODONE (OXY IR/ROXICODONE) 5 MG immediate release tablet Take 5 mg by mouth at bedtime as needed for severe pain.     [provider]  Sennosides (EX-LAX PO) Take 2 tablets by mouth at bedtime as needed (constipation).    [provider]  Terbinafine HCl (LAMISIL SPRAY EX) Apply 1 spray topically daily as needed (foot itching).    [provider]  Tetrahydrozoline HCl (VISINE OP) Apply 1 drop to eye daily as needed (dry eyes).    [provider]  tiotropium (SPIRIVA) 18 MCG inhalation capsule Place 18 mcg into inhaler and inhale daily as needed (  shortness of breath).     [provider]  traMADol (ULTRAM) 50 MG tablet Take 50 mg by mouth at bedtime as needed for moderate pain.     [provider]  venlafaxine (EFFEXOR) 75 MG tablet Take 75 mg by mouth 2 (two) times daily.    [provider]  vitamin B-12 (CYANOCOBALAMIN) 1000 MCG tablet Take 1,000 mcg by mouth 2 (two) times daily.     [provider]    Allergies Codeine and Contrast media [iodinated diagnostic agents]  Family History  Problem Relation Age of Onset  . Hypertension Mother   . Breast cancer Mother   . Stroke Mother   . Heart disease Father   . Heart attack Sister   . Hypertension Brother   . Heart attack Sister   . Colon cancer Brother 49    Social History Social History  Substance Use Topics  . Smoking status: Former Smoker    Packs/day: 3.00    Years: 20.00    Types: Cigarettes    Quit date: 02/04/2002  . Smokeless tobacco: Never Used  . Alcohol use No    Review of Systems  Constitutional: No fever/chills Eyes: No visual changes. ENT: No sore throat. Cardiovascular: Denies chest pain. Respiratory: as above Gastrointestinal: No abdominal pain.  No  nausea, no vomiting.  No diarrhea.  No constipation. Genitourinary: Negative for dysuria. Musculoskeletal: Negative for back pain. Skin: Negative for rash. Neurological: Negative for headaches, focal weakness or numbness.   ____________________________________________   PHYSICAL EXAM:  VITAL SIGNS: ED Triage Vitals  Enc Vitals Group     BP 11/02/16 1655 (!) 143/88     Pulse Rate 11/02/16 1655 (!) 107     Resp 11/02/16 1655 20     Temp 11/02/16 1655 98.3 F (36.8 C)     Temp src --      SpO2 11/02/16 1655 97 %     Weight 11/02/16 1651 203 lb (92.1 kg)     Height 11/02/16 1651 5\' 3"  (1.6 m)     Head Circumference --      Peak Flow --      Pain Score --      Pain Loc --      Pain Edu? --      Excl. in [place]? --     Constitutional: Alert and oriented. Well appearing and in no acute distress. Eyes: Conjunctivae are normal.  Head: Atraumatic. Nose: No congestion/rhinnorhea. Mouth/Throat: Mucous membranes are moist.  Neck: No stridor.   Cardiovascular: Normal rate, regular rhythm. heart rate of 95 while I'm in the room.Grossly normal heart sounds.   Respiratory: Normal respiratory effort.  No retractions. severely reduced air movement throughout all fields. Minimal wheezing. Gastrointestinal: Soft and nontender. No distention.  Musculoskeletal: No lower extremity tenderness nor edema.  No joint effusions. Neurologic:  Normal speech and language. No gross focal neurologic deficits are appreciated. Skin:  Skin is warm, dry and intact. No rash noted. Psychiatric: Mood and affect are normal. Speech and behavior are normal.  ____________________________________________   LABS (all labs ordered are listed, but only abnormal results are displayed)  Labs Reviewed  BASIC METABOLIC PANEL - Abnormal; Notable for the following:       Result Value   Potassium 3.4 (*)    Chloride 97 (*)    CO2 33 (*)    Glucose, Bld 149 (*)    All other components within normal limits  CBC -  Abnormal; Notable for the following:  Hemoglobin 10.8 (*)    HCT 33.9 (*)    MCHC 31.9 (*)    RDW 18.4 (*)    All other components within normal limits  TROPONIN I  DIGOXIN LEVEL   ____________________________________________  EKG  ED ECG REPORT I, [place],  Youlanda Roys, the attending physician, personally viewed and interpreted this ECG.   Date: 11/02/2016  EKG Time: 1657  Rate: 97  Rhythm: atrial sensed ventricular pacing  Axis: normal  Intervals:wide complex QRS consistent with ventricular pacing  ST&T Change: no ST segment elevation or depression. No abnormal T-wave inversion.  ____________________________________________  RADIOLOGY  no active cardiopulmonary disease. ____________________________________________   PROCEDURES  Procedure(s) performed:   Procedures  Critical Care performed:   ____________________________________________   INITIAL IMPRESSION / ASSESSMENT AND PLAN / ED COURSE  Pertinent labs & imaging results that were available during my care of the patient were reviewed by me and considered in my medical decision making (see chart for details).  DDX: COPD, CHF, PE, pneumothorax.    ----------------------------------------- 7:25 PM on 11/02/2016 -----------------------------------------  Patient at this time says that she feels back to her baseline. She is 97% on her baseline 3 L of nasal cannula oxygen. Her heart rate is 97 bpm. On reexamination her lungs are clear to auscultation throughout and she has good air movement. She'll be discharged home on steroids, a Z-Pak and also an inhaler. She is understanding of the plan and willing to comply.symptoms relieved with COPD treatment. This is the most likely diagnosis.  ____________________________________________   FINAL CLINICAL IMPRESSION(S) / ED DIAGNOSES  COPD exacerbation.    NEW MEDICATIONS STARTED DURING THIS VISIT:  New Prescriptions   No medications on file     Note:   This document was prepared using Dragon voice recognition software and may include unintentional dictation errors.     Orbie Pyo, MD 11/02/16 [phone]

## 2016-11-02 NOTE — ED Triage Notes (Signed)
Pt came to ED via EMS from home c/o sob and dizziness starting today at noon. History of copd and htn. Normally on 3L o2, went up to 4L due to sob.

## 2016-11-14 ENCOUNTER — Ambulatory Visit (INDEPENDENT_AMBULATORY_CARE_PROVIDER_SITE_OTHER): Payer: Medicare Other

## 2016-11-14 DIAGNOSIS — I5022 Chronic systolic (congestive) heart failure: Secondary | ICD-10-CM

## 2016-11-14 DIAGNOSIS — Z95810 Presence of automatic (implantable) cardiac defibrillator: Secondary | ICD-10-CM | POA: Diagnosis not present

## 2016-11-14 NOTE — Progress Notes (Signed)
EPIC Encounter for ICM Monitoring  Patient Name: Stacy Grimes is a 66 y.o. female Date: 11/14/2016 Primary Care Physican: Cyndi Bender, PA-C Primary [place] Electrophysiologist: Caryl Comes Dry Weight: 201lbs Bi-V Pacing: 97%        Spoke with husband.  Heart Failure questions reviewed, pt asymptomatic at this time.  She did have a lot of breathing problems and maybe respiratory infection which resulted in ER visit on 11/02/2016.  Device replacement was on 09/17/2016.     Thoracic impedance normal but was abnormal suggesting fluid accumulation from approximately 11/06/2016 to 11/12/2016.  Prescribed dosage: Furosemide 40 mg 1 tablet twice a day  Labs: 11/02/2016 Creatinine 0.87, BUN 13, Potassium 3.4, Sodium 141, EGFR >60 09/10/2016 Creatinine 0.93, BUN 13, Potassium 4.1, Sodium 144, EGFR 64-74  03/28/2016 Creatinine 1.04, BUN 13, Potassium 4.7, Sodium 142, EGFR 56-65   Recommendations: No changes.   Encouraged to call for fluid symptoms.  Follow-up plan: ICM clinic phone appointment on 01/23/2017.  Office appointment scheduled 12/23/2016 with Dr. Caryl Comes.  Copy of ICM check sent to Dr. Caryl Comes.   3 month ICM trend: 11/14/2016   1 Year ICM trend:      [name], RN 11/14/2016 10:43 AM

## 2016-12-23 ENCOUNTER — Encounter: Payer: Medicare Other | Admitting: Internal Medicine

## 2017-01-23 ENCOUNTER — Ambulatory Visit (INDEPENDENT_AMBULATORY_CARE_PROVIDER_SITE_OTHER): Payer: Medicare Other

## 2017-01-23 DIAGNOSIS — Z95810 Presence of automatic (implantable) cardiac defibrillator: Secondary | ICD-10-CM | POA: Diagnosis not present

## 2017-01-23 DIAGNOSIS — I5022 Chronic systolic (congestive) heart failure: Secondary | ICD-10-CM | POA: Diagnosis not present

## 2017-01-24 NOTE — Progress Notes (Signed)
EPIC Encounter for ICM Monitoring  Patient Name: Stacy Grimes is a 66 y.o. female Date: 01/24/2017 Primary Care Physican: Cyndi Bender, PA-C Primary Care Physican: Cyndi Bender, PA-C Primary [place] Electrophysiologist: Caryl Comes Dry Weight:Previous weight 201lbs Bi-V Pacing: 96%       Transmission received.   Thoracic impedance normal.  Prescribed dosage: Furosemide 40 mg 1 tablet twice a day  Labs: 11/02/2016 Creatinine 0.87, BUN 13, Potassium 3.4, Sodium 141, EGFR >60 09/10/2016 Creatinine 0.93, BUN 13, Potassium 4.1, Sodium 144, EGFR 64-74  03/28/2016 Creatinine 1.04, BUN 13, Potassium 4.7, Sodium 142, EGFR 56-65   Recommendations: None.  Follow-up plan: ICM clinic phone appointment on 02/24/2017.    Copy of ICM check sent to Dr. Caryl Comes.   3 month ICM trend: 01/23/2017    1 Year ICM trend:       [name], RN 01/24/2017 4:36 PM

## 2017-02-24 ENCOUNTER — Ambulatory Visit (INDEPENDENT_AMBULATORY_CARE_PROVIDER_SITE_OTHER): Payer: Medicare Other

## 2017-02-24 DIAGNOSIS — Z95810 Presence of automatic (implantable) cardiac defibrillator: Secondary | ICD-10-CM

## 2017-02-24 DIAGNOSIS — I5022 Chronic systolic (congestive) heart failure: Secondary | ICD-10-CM | POA: Diagnosis not present

## 2017-02-24 NOTE — Progress Notes (Signed)
EPIC Encounter for ICM Monitoring  Patient Name: Stacy Grimes is a 67 y.o. female Date: 02/24/2017 Primary Care Physican: Cyndi Bender, PA-C Primary [place] Electrophysiologist: Caryl Comes Dry Weight:Previous weight 201lbs Bi-V Pacing: 97%         Attempted call to patient and unable to reach.  Left detailed message regarding transmission.  Transmission reviewed.    Thoracic impedance normal.  Prescribed dosage: Furosemide 40 mg 1 tablet twice a day  Labs: 11/02/2016 Creatinine0.87, BUN13, Potassium3.4, [id number], EGFR>60 09/10/2016 Creatinine0.93, BUN13, Potassium4.1, [id number], [id number]  03/28/2016 Creatinine1.04, BUN13, Potassium4.7, [id number], [id number]  Recommendations: Left voice mail with ICM number and encouraged to call if experiencing any fluid symptoms.  Follow-up plan: ICM clinic phone appointment on 03/27/2017.    Copy of ICM check sent to Dr. Caryl Comes.   3 month ICM trend: 02/24/2017    1 Year ICM trend:       Stacy Billings, RN 02/24/2017 10:06 AM

## 2017-02-25 ENCOUNTER — Telehealth: Payer: Self-pay

## 2017-02-25 NOTE — Telephone Encounter (Signed)
Remote ICM transmission received.  Attempted call to patient and left detailed message per DPR regarding transmission and next ICM scheduled for 03/27/2017.  Advised to return call for any fluid symptoms or questions.

## 2017-03-04 DIAGNOSIS — Z6838 Body mass index (BMI) 38.0-38.9, adult: Secondary | ICD-10-CM | POA: Diagnosis not present

## 2017-03-04 DIAGNOSIS — F418 Other specified anxiety disorders: Secondary | ICD-10-CM | POA: Diagnosis not present

## 2017-03-04 DIAGNOSIS — I509 Heart failure, unspecified: Secondary | ICD-10-CM | POA: Diagnosis not present

## 2017-03-04 DIAGNOSIS — E039 Hypothyroidism, unspecified: Secondary | ICD-10-CM | POA: Diagnosis not present

## 2017-03-04 DIAGNOSIS — M545 Low back pain: Secondary | ICD-10-CM | POA: Diagnosis not present

## 2017-03-04 DIAGNOSIS — Z23 Encounter for immunization: Secondary | ICD-10-CM | POA: Diagnosis not present

## 2017-03-04 DIAGNOSIS — J9610 Chronic respiratory failure, unspecified whether with hypoxia or hypercapnia: Secondary | ICD-10-CM | POA: Diagnosis not present

## 2017-03-04 DIAGNOSIS — M109 Gout, unspecified: Secondary | ICD-10-CM | POA: Diagnosis not present

## 2017-03-27 ENCOUNTER — Encounter: Payer: Self-pay | Admitting: Cardiology

## 2017-03-27 ENCOUNTER — Telehealth: Payer: Self-pay

## 2017-03-27 ENCOUNTER — Ambulatory Visit (INDEPENDENT_AMBULATORY_CARE_PROVIDER_SITE_OTHER): Payer: Medicare Other | Admitting: *Deleted

## 2017-03-27 DIAGNOSIS — Z95810 Presence of automatic (implantable) cardiac defibrillator: Secondary | ICD-10-CM

## 2017-03-27 DIAGNOSIS — I428 Other cardiomyopathies: Secondary | ICD-10-CM

## 2017-03-27 DIAGNOSIS — I5022 Chronic systolic (congestive) heart failure: Secondary | ICD-10-CM | POA: Diagnosis not present

## 2017-03-27 NOTE — Telephone Encounter (Signed)
Remote ICM transmission received.  Attempted call to patient and recording stated person is not accepting calls at this time.

## 2017-03-27 NOTE — Progress Notes (Signed)
EPIC Encounter for ICM Monitoring  Patient Name: Stacy Grimes is a 67 y.o. female Date: 03/27/2017 Primary Care Physican: Cyndi Bender, PA-C Primary [place] Electrophysiologist: Caryl Comes Dry Weight:Previous weight201lbs Bi-V Pacing: 97%      Attempted call to patient and unable to reach.    Transmission reviewed.    Thoracic impedance normal but was abnormal suggesting fluid accumulation from 02/28/2017 to 03/09/2017.  Prescribed dosage: Furosemide 40 mg 1 tablet twice a day  Labs: 11/02/2016 Creatinine0.87, BUN13, Potassium3.4, [id number], EGFR>60 09/10/2016 Creatinine0.93, BUN13, Potassium4.1, [id number], XFFK92-23  03/28/2016 Creatinine1.04, BUN13, Potassium4.7, [id number], [id number]  Recommendations: NONE - Unable to reach.  Follow-up plan: ICM clinic phone appointment on 04/28/2017.    Copy of ICM check sent to Dr. Caryl Comes.   3 month ICM trend: 03/27/2017    1 Year ICM trend:       [name], RN 03/27/2017 10:45 AM

## 2017-03-27 NOTE — Progress Notes (Signed)
Remote ICD transmission.   

## 2017-03-31 LAB — CUP PACEART REMOTE DEVICE CHECK
Battery Remaining Longevity: 74 mo
Battery Voltage: 3.1 V
Brady Statistic AP VP Percent: 1 %
Brady Statistic AP VS Percent: 1 %
Brady Statistic AS VP Percent: 97 %
Brady Statistic AS VS Percent: 3.1 %
Brady Statistic RA Percent Paced: 1 %
Date Time Interrogation Session: 20190221070018
HIGH POWER IMPEDANCE MEASURED VALUE: 52 Ohm
HighPow Impedance: 53 Ohm
Implantable Lead Implant Date: 20110309
Implantable Lead Implant Date: 20110309
Implantable Lead Location: 753858
Lead Channel Impedance Value: 360 Ohm
Lead Channel Pacing Threshold Pulse Width: 0.5 ms
Lead Channel Pacing Threshold Pulse Width: 0.5 ms
Lead Channel Sensing Intrinsic Amplitude: 5 mV
Lead Channel Setting Pacing Amplitude: 2 V
Lead Channel Setting Pacing Amplitude: 2.5 V
Lead Channel Setting Pacing Pulse Width: 0.5 ms
MDC IDC LEAD IMPLANT DT: 20110309
MDC IDC LEAD LOCATION: 753859
MDC IDC LEAD LOCATION: 753860
MDC IDC MSMT BATTERY REMAINING PERCENTAGE: 90 %
MDC IDC MSMT LEADCHNL LV IMPEDANCE VALUE: 690 Ohm
MDC IDC MSMT LEADCHNL LV PACING THRESHOLD AMPLITUDE: 0.75 V
MDC IDC MSMT LEADCHNL RA PACING THRESHOLD AMPLITUDE: 1.125 V
MDC IDC MSMT LEADCHNL RA PACING THRESHOLD PULSEWIDTH: 0.5 ms
MDC IDC MSMT LEADCHNL RV IMPEDANCE VALUE: 560 Ohm
MDC IDC MSMT LEADCHNL RV PACING THRESHOLD AMPLITUDE: 0.5 V
MDC IDC MSMT LEADCHNL RV SENSING INTR AMPL: 11.7 mV
MDC IDC PG IMPLANT DT: 20180814
MDC IDC SET LEADCHNL LV PACING PULSEWIDTH: 0.5 ms
MDC IDC SET LEADCHNL RA PACING AMPLITUDE: 2.25 V
MDC IDC SET LEADCHNL RV SENSING SENSITIVITY: 0.5 mV
Pulse Gen Serial Number: 9761609

## 2017-04-28 ENCOUNTER — Ambulatory Visit (INDEPENDENT_AMBULATORY_CARE_PROVIDER_SITE_OTHER): Payer: Medicare Other

## 2017-04-28 DIAGNOSIS — Z95810 Presence of automatic (implantable) cardiac defibrillator: Secondary | ICD-10-CM | POA: Diagnosis not present

## 2017-04-28 DIAGNOSIS — I5022 Chronic systolic (congestive) heart failure: Secondary | ICD-10-CM

## 2017-04-28 NOTE — Progress Notes (Signed)
EPIC Encounter for ICM Monitoring  Patient Name: Stacy Grimes is a 67 y.o. female Date: 04/28/2017 Primary Care Physican: Cyndi Bender, PA-C Primary [place] Electrophysiologist: Caryl Comes Dry Weight:200lbs Bi-V Pacing: 97%         Spoke with husband. Heart Failure questions reviewed, pt asymptomatic.  He stated patients memory is getting worse.  He reported her memory loss was from a medication she took in the past.   Provided reference to Lutricia Horsfall at [facility], phone number [phone]. Encouraged him to call and explained resources are available through the support services.    Thoracic impedance normal.  Prescribed dosage: Furosemide 40 mg 1 tablet twice a day  Labs: 11/02/2016 Creatinine0.87, BUN13, Potassium3.4, Sodium141, EGFR>60 09/10/2016 Creatinine0.93, BUN13, Potassium4.1, [id number], SVXB93-90  03/28/2016 Creatinine1.04, BUN13, Potassium4.7, ZESPQZ300, [id number]  Recommendations: No changes.  Encouraged to call for fluid symptoms.  Follow-up plan: ICM clinic phone appointment on 05/29/2017.    Copy of ICM check sent to Dr. Caryl Comes.   3 month ICM trend: 04/28/2017    1 Year ICM trend:       Stacy Billings, RN 04/28/2017 9:48 AM

## 2017-05-29 ENCOUNTER — Ambulatory Visit (INDEPENDENT_AMBULATORY_CARE_PROVIDER_SITE_OTHER): Payer: Medicare Other

## 2017-05-29 DIAGNOSIS — I5022 Chronic systolic (congestive) heart failure: Secondary | ICD-10-CM | POA: Diagnosis not present

## 2017-05-29 DIAGNOSIS — Z95810 Presence of automatic (implantable) cardiac defibrillator: Secondary | ICD-10-CM

## 2017-05-30 NOTE — Progress Notes (Signed)
EPIC Encounter for ICM Monitoring  Patient Name: Stacy Grimes is a 67 y.o. female Date: 05/30/2017 Primary Care Physican: Conroy, Nathan, PA-C Primary Cardiologist:Klein Electrophysiologist: Klein Dry Weight:200lbs Bi-V Pacing: 97%       Spoke with husband. Heart Failure questions reviewed, pt asymptomatic.   Thoracic impedance normal.  Prescribed dosage: Furosemide 40 mg 1 tablet twice a day  Labs: 11/02/2016 Creatinine0.87, BUN13, Potassium3.4, Sodium141, EGFR>60 09/10/2016 Creatinine0.93, BUN13, Potassium4.1, Sodium144, EGFR64-74  03/28/2016 Creatinine1.04, BUN13, Potassium4.7, Sodium142, EGFR56-65  Recommendations: No changes.   Encouraged to call for fluid symptoms.  Follow-up plan: ICM clinic phone appointment on 07/01/2017.    Copy of ICM check sent to Dr. Klein.   3 month ICM trend: 05/29/2017    1 Year ICM trend:        S , RN 05/30/2017 1:15 PM   

## 2017-06-17 ENCOUNTER — Encounter: Payer: Self-pay | Admitting: Internal Medicine

## 2017-06-17 DIAGNOSIS — J449 Chronic obstructive pulmonary disease, unspecified: Secondary | ICD-10-CM | POA: Diagnosis not present

## 2017-06-17 DIAGNOSIS — E039 Hypothyroidism, unspecified: Secondary | ICD-10-CM | POA: Diagnosis not present

## 2017-06-17 DIAGNOSIS — M545 Low back pain: Secondary | ICD-10-CM | POA: Diagnosis not present

## 2017-06-17 DIAGNOSIS — E7800 Pure hypercholesterolemia, unspecified: Secondary | ICD-10-CM | POA: Diagnosis not present

## 2017-06-17 DIAGNOSIS — M109 Gout, unspecified: Secondary | ICD-10-CM | POA: Diagnosis not present

## 2017-06-17 DIAGNOSIS — F418 Other specified anxiety disorders: Secondary | ICD-10-CM | POA: Diagnosis not present

## 2017-06-17 DIAGNOSIS — Z79899 Other long term (current) drug therapy: Secondary | ICD-10-CM | POA: Diagnosis not present

## 2017-07-01 ENCOUNTER — Ambulatory Visit (INDEPENDENT_AMBULATORY_CARE_PROVIDER_SITE_OTHER): Payer: Medicare Other | Admitting: *Deleted

## 2017-07-01 DIAGNOSIS — I5022 Chronic systolic (congestive) heart failure: Secondary | ICD-10-CM | POA: Diagnosis not present

## 2017-07-01 DIAGNOSIS — I428 Other cardiomyopathies: Secondary | ICD-10-CM

## 2017-07-01 DIAGNOSIS — Z95810 Presence of automatic (implantable) cardiac defibrillator: Secondary | ICD-10-CM

## 2017-07-01 NOTE — Progress Notes (Signed)
EPIC Encounter for ICM Monitoring  Patient Name: Stacy Grimes is a 66 y.o. female Date: 07/01/2017 Primary Care Physican: Cyndi Bender, PA-C Primary [place] Electrophysiologist: Caryl Comes Dry Weight:Previous weight200lbs Bi-V Pacing: 97%       Attempted call to husband and unable to reach.  Left detailed message, per DPR, regarding transmission.  Transmission reviewed. .   Thoracic impedance normal but was abnormal suggesting fluid accumulation the last 12 days  Prescribed dosage: Furosemide 40 mg 1 tablet twice a day  Labs: 11/02/2016 Creatinine0.87, BUN13, Potassium3.4, Sodium141, EGFR>60 09/10/2016 Creatinine0.93, BUN13, Potassium4.1, [id number], [id number]  03/28/2016 Creatinine1.04, BUN13, Potassium4.7, [id number], YHTM93-11   Recommendations: Left voice mail with ICM number and encouraged to call if experiencing any fluid symptoms.  Follow-up plan: ICM clinic phone appointment on 08/18/2017.  Office appointment scheduled 07/16/2017 with Dr. Caryl Comes.  Copy of ICM check sent to Dr. Caryl Comes.   3 month ICM trend: 07/01/2017    1 Year ICM trend:       [name], RN 07/01/2017 5:48 PM

## 2017-07-01 NOTE — Progress Notes (Signed)
Remote ICD transmission.   

## 2017-07-04 ENCOUNTER — Encounter: Payer: Self-pay | Admitting: Internal Medicine

## 2017-07-04 ENCOUNTER — Encounter: Payer: Self-pay | Admitting: Cardiology

## 2017-07-04 DIAGNOSIS — R229 Localized swelling, mass and lump, unspecified: Secondary | ICD-10-CM | POA: Diagnosis not present

## 2017-07-04 DIAGNOSIS — E1165 Type 2 diabetes mellitus with hyperglycemia: Secondary | ICD-10-CM | POA: Diagnosis not present

## 2017-07-04 DIAGNOSIS — Z6837 Body mass index (BMI) 37.0-37.9, adult: Secondary | ICD-10-CM | POA: Diagnosis not present

## 2017-07-16 ENCOUNTER — Ambulatory Visit (INDEPENDENT_AMBULATORY_CARE_PROVIDER_SITE_OTHER): Payer: Medicare Other | Admitting: Internal Medicine

## 2017-07-16 ENCOUNTER — Encounter: Payer: Self-pay | Admitting: Internal Medicine

## 2017-07-16 VITALS — BP 124/80 | HR 98 | Ht 63.0 in | Wt 200.0 lb

## 2017-07-16 DIAGNOSIS — Z4502 Encounter for adjustment and management of automatic implantable cardiac defibrillator: Secondary | ICD-10-CM | POA: Diagnosis not present

## 2017-07-16 DIAGNOSIS — I5022 Chronic systolic (congestive) heart failure: Secondary | ICD-10-CM | POA: Diagnosis not present

## 2017-07-16 DIAGNOSIS — I428 Other cardiomyopathies: Secondary | ICD-10-CM

## 2017-07-16 LAB — CUP PACEART INCLINIC DEVICE CHECK
Battery Remaining Longevity: 67 mo
Brady Statistic RA Percent Paced: 0.04 %
Brady Statistic RV Percent Paced: 97 %
HIGH POWER IMPEDANCE MEASURED VALUE: 52.1098
Implantable Lead Implant Date: 20110309
Implantable Lead Implant Date: 20110309
Implantable Lead Location: 753858
Implantable Lead Location: 753859
Implantable Lead Location: 753860
Implantable Lead Model: 7121
Lead Channel Impedance Value: 350 Ohm
Lead Channel Impedance Value: 537.5 Ohm
Lead Channel Pacing Threshold Amplitude: 0.75 V
Lead Channel Pacing Threshold Amplitude: 0.75 V
Lead Channel Pacing Threshold Amplitude: 0.75 V
Lead Channel Pacing Threshold Amplitude: 1 V
Lead Channel Pacing Threshold Pulse Width: 0.5 ms
Lead Channel Pacing Threshold Pulse Width: 0.5 ms
Lead Channel Setting Pacing Amplitude: 2 V
Lead Channel Setting Pacing Amplitude: 2.5 V
Lead Channel Setting Sensing Sensitivity: 0.5 mV
MDC IDC LEAD IMPLANT DT: 20110309
MDC IDC MSMT LEADCHNL LV IMPEDANCE VALUE: 662.5 Ohm
MDC IDC MSMT LEADCHNL LV PACING THRESHOLD AMPLITUDE: 0.75 V
MDC IDC MSMT LEADCHNL LV PACING THRESHOLD PULSEWIDTH: 0.5 ms
MDC IDC MSMT LEADCHNL LV PACING THRESHOLD PULSEWIDTH: 0.5 ms
MDC IDC MSMT LEADCHNL RA PACING THRESHOLD AMPLITUDE: 1 V
MDC IDC MSMT LEADCHNL RA PACING THRESHOLD PULSEWIDTH: 0.5 ms
MDC IDC MSMT LEADCHNL RA SENSING INTR AMPL: 5 mV
MDC IDC MSMT LEADCHNL RV PACING THRESHOLD PULSEWIDTH: 0.5 ms
MDC IDC MSMT LEADCHNL RV SENSING INTR AMPL: 11.7 mV
MDC IDC PG IMPLANT DT: 20180814
MDC IDC SESS DTM: 20190612150649
MDC IDC SET LEADCHNL LV PACING PULSEWIDTH: 0.5 ms
MDC IDC SET LEADCHNL RA PACING AMPLITUDE: 2.25 V
MDC IDC SET LEADCHNL RV PACING PULSEWIDTH: 0.5 ms
Pulse Gen Serial Number: 9761609

## 2017-07-16 MED ORDER — DIGOXIN 125 MCG PO TABS: ORAL_TABLET | ORAL | 3 refills | Status: DC

## 2017-07-16 NOTE — Progress Notes (Signed)
To     Patient Care Team: Cyndi Bender, Hershal Coria as PCP - General (Physician Assistant)   HPI  Stacy Grimes is a 67 y.o. 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   On current O2 therapy.    No chest pain  Chronic mild edema  Catheterization 2010 demonstrated an ejection fraction of 15-20% without obstructive coronary disease repeat echo 2018 with poor windows suggested low normal LVEF  Date Cr K Hgb Dig  9/18 0.87 3.4 10.8 0.8   6/19 1.0 5.3 11    Chronic shortness of breath, this can be quite limiting and assoc wi LH  no edema  Orthostatic LIghtheadedness with presyncope  Blood work drawn by PCP   Past Medical History:  Diagnosis Date  . AAA (abdominal aortic aneurysm) ([place])   . Anginal pain ([place])   . Anxiety   . Arthritis   . Automatic implantable cardioverter-defibrillator in situ   . Biventricular ICD  [employer]   . CHF (congestive heart failure) ([place])   . Chronic systolic heart failure ([place])   . COPD (chronic obstructive pulmonary disease) ([place])   . Deafness    left ear  . Depression   . Dizziness   . Dysrhythmia   . Fracture    history of spinal fracture  . GERD (gastroesophageal reflux disease)   . Hypertension   . Hypothyroidism   . Non-ischemic cardiomyopathy ([place])   . Oxygen dependent   . Palpitations   . Shortness of breath dyspnea   . Sleep apnea   . Wheezing     Past Surgical History:  Procedure Laterality Date  . ABDOMINAL HYSTERECTOMY    . BIV ICD GENERATOR CHANGEOUT N/A 09/17/2016   Procedure: BiV ICD Generator Changeout;  Surgeon: Deboraha Sprang, MD;  Location: [facility];  Service: Cardiovascular;  Laterality: N/A;  . BLADDER SURGERY    . CARDIAC CATHETERIZATION    . CARDIAC DEFIBRILLATOR PLACEMENT  4 yrs ago   pacemaker  . CATARACT EXTRACTION W/PHACO Left 09/01/2014   Procedure: CATARACT EXTRACTION PHACO AND INTRAOCULAR LENS  PLACEMENT (IOC);  Surgeon: Lyla Glassing, MD;  Location: [facility];  Service: Ophthalmology;  Laterality: Left;  [place]: 1:12.1   . CATARACT EXTRACTION W/PHACO Right 09/29/2014   Procedure: CATARACT EXTRACTION PHACO AND INTRAOCULAR LENS PLACEMENT (IOC);  Surgeon: Lyla Glassing, MD;  Location: [facility];  Service: Ophthalmology;  Laterality: Right;  [place]: 00:52.7   . CHOLECYSTECTOMY    . COLON SURGERY    . COLONOSCOPY    . COLONOSCOPY N/A 08/31/2012   Procedure: COLONOSCOPY;  Surgeon: Inda Castle, MD;  Location: [facility];  Service: Endoscopy;  Laterality: N/A;  . ESOPHAGOGASTRODUODENOSCOPY N/A 07/06/2012   Procedure: ESOPHAGOGASTRODUODENOSCOPY (EGD);  Surgeon: Lafayette Dragon, MD;  Location: [facility];  Service: Endoscopy;  Laterality: N/A;  . KNEE SURGERY Right   . MIDDLE EAR SURGERY    . SAVORY DILATION N/A 07/06/2012   Procedure: SAVORY DILATION;  Surgeon: Lafayette Dragon, MD;  Location: [facility];  Service: Endoscopy;  Laterality: N/A;  . WRIST SURGERY Right     Current Outpatient Medications  Medication Sig Dispense Refill  . albuterol (PROVENTIL HFA;VENTOLIN HFA) 108 (90 Base) MCG/ACT inhaler Inhale 2 puffs into the lungs every 6 (six) hours as needed. 1 Inhaler 0  . allopurinol (ZYLOPRIM) 300 MG tablet Take 300 mg by mouth 2 (two) times daily.     Marland Kitchen aspirin  81 MG EC tablet Take 81 mg by mouth daily.      . cyclobenzaprine (FLEXERIL) 10 MG tablet Take 10 mg by mouth 3 (three) times daily as needed for muscle spasms.     . digoxin (LANOXIN) 0.125 MG tablet Take 1 tablet by mouth every other day. 45 tablet 3  . diphenhydrAMINE (BENADRYL) 25 MG tablet Take 25 mg by mouth at bedtime as needed for sleep.    Marland Kitchen doxycycline (VIBRAMYCIN) 100 MG capsule Take 1 capsule (100 mg total) by mouth 2 (two) times daily. 20 capsule 0  . furosemide (LASIX) 40 MG tablet Take 1 tablet (40 mg total) by mouth 2 (two) times daily. 60 tablet 6  . gabapentin (NEURONTIN) 300 MG capsule Take 300 mg by mouth 2  (two) times daily.    Marland Kitchen guaiFENesin-codeine (ROBITUSSIN AC) 100-10 MG/5ML syrup Take 5 mLs by mouth 3 (three) times daily as needed for cough.    . hydrOXYzine (VISTARIL) 25 MG capsule Take 25 mg by mouth at bedtime as needed for anxiety (sleep).    Marland Kitchen ipratropium-albuterol (DUONEB) 0.5-2.5 (3) MG/3ML SOLN Take 3 mLs by nebulization every 4 (four) hours as needed (for wheezing).     Marland Kitchen levothyroxine (SYNTHROID, LEVOTHROID) 112 MCG tablet Take 112 mcg by mouth daily.    Marland Kitchen losartan (COZAAR) 50 MG tablet Take 25 mg by mouth daily.    . Melatonin 10 MG TABS Take 10 mg by mouth at bedtime as needed (sleep).    . Menthol, Topical Analgesic, (BENGAY EX) Apply 1 application topically daily as needed (back pain).    Marland Kitchen omeprazole (PRILOSEC) 20 MG capsule Take 20 mg by mouth every morning.     Marland Kitchen oxyCODONE (OXY IR/ROXICODONE) 5 MG immediate release tablet Take 5 mg by mouth at bedtime as needed for severe pain.     . predniSONE (DELTASONE) 20 MG tablet Take 3 tablets (60 mg total) by mouth daily. 15 tablet 0  . Sennosides (EX-LAX PO) Take 2 tablets by mouth at bedtime as needed (constipation).    . Terbinafine HCl (LAMISIL SPRAY EX) Apply 1 spray topically daily as needed (foot itching).    . Tetrahydrozoline HCl (VISINE OP) Apply 1 drop to eye daily as needed (dry eyes).    Marland Kitchen tiotropium (SPIRIVA) 18 MCG inhalation capsule Place 18 mcg into inhaler and inhale daily as needed (shortness of breath).     . traMADol (ULTRAM) 50 MG tablet Take 50 mg by mouth at bedtime as needed for moderate pain.     Marland Kitchen venlafaxine (EFFEXOR) 75 MG tablet Take 75 mg by mouth 2 (two) times daily.    . vitamin B-12 (CYANOCOBALAMIN) 1000 MCG tablet Take 1,000 mcg by mouth 2 (two) times daily.      No current facility-administered medications for this visit.     Allergies  Allergen Reactions  . Codeine Palpitations  . Contrast Media [Iodinated Diagnostic Agents] Rash    Review of Systems negative except from HPI and  PMH  Physical Exam BP 124/80   Pulse 98   Ht 5\' 3"  (1.6 m)   Wt 200 lb (90.7 kg)   SpO2 96%   BMI 35.43 kg/m  Well developed and nourished wearing 02 HENT normal Neck supple unable to determine JVP Decrease BS w wheezing\ Regular rate and rhythm, no murmurs or gallops Abd-soft with active BS No Clubbing cyanosis edema Skin-warm and dry A & Oriented  Grossly normal sensory and motor function   ECG sinus 98 14/13/40  Axis 95  Assessment and  Plan Nonischemic cardiomyopathy  Congestive heart failure-chronic-systolic  Implantable defibrillator-[employer]-CRT The patient's device was interrogated.  The information was reviewed. No changes were made in the programming.     Oxygen-dependent COPD  Stable   After walking down the hall, she found herself extremely short of breath and unable to continue.  Her heart rate was in the 130s.  Her oxygen saturations even on oxygen were the high 80s.  It is recommended that she follow-up urgently with her pulmonary physician whom she has not seen in over a year.  I am surprised that she is not more polycythemic given her oxygen dependent.  Euvolemic continue current meds  Continue current meds  Labs from the PCP office were reviewed hemoglobin is stable.  K is borderline elevated at 5.3.  We spent more than 50% of our >25 min visit in face to face counseling regarding the above

## 2017-07-16 NOTE — Patient Instructions (Signed)
Medication Instructions:  Your physician has recommended you make the following change in your medication:   1. Take your Digoxin every 3rd day.  Labwork: None ordered.  Testing/Procedures: None ordered.  Follow-Up: Your physician wants you to follow-up in: One Year with Dr Caryl Comes. You will receive a reminder letter in the mail two months in advance. If you don't receive a letter, please call our office to schedule the follow-up appointment.  Remote monitoring is used to monitor your ICD from home. This monitoring reduces the number of office visits required to check your device to one time per year. It allows [place] to keep an eye on the functioning of your device to ensure it is working properly. You are scheduled for a device check from home on 09/30/2017. You may send your transmission at any time that day. If you have a wireless device, the transmission will be sent automatically. After your physician reviews your transmission, you will receive a postcard with your next transmission date.    Any Other Special Instructions Will Be Listed Below (If Applicable).     If you need a refill on your cardiac medications before your next appointment, please call your pharmacy.

## 2017-07-22 LAB — CUP PACEART REMOTE DEVICE CHECK
Battery Voltage: 3.04 V
Brady Statistic AP VP Percent: 1 %
Brady Statistic AP VS Percent: 1 %
Brady Statistic AS VP Percent: 97 %
Brady Statistic RA Percent Paced: 1 %
Date Time Interrogation Session: 20190528060016
HighPow Impedance: 55 Ohm
HighPow Impedance: 55 Ohm
Implantable Lead Implant Date: 20110309
Implantable Lead Location: 753859
Implantable Lead Location: 753860
Implantable Lead Model: 7121
Implantable Pulse Generator Implant Date: 20180814
Lead Channel Impedance Value: 360 Ohm
Lead Channel Impedance Value: 700 Ohm
Lead Channel Pacing Threshold Amplitude: 0.5 V
Lead Channel Pacing Threshold Amplitude: 0.75 V
Lead Channel Sensing Intrinsic Amplitude: 11.7 mV
Lead Channel Sensing Intrinsic Amplitude: 5 mV
Lead Channel Setting Pacing Amplitude: 2.25 V
Lead Channel Setting Pacing Amplitude: 2.5 V
Lead Channel Setting Pacing Pulse Width: 0.5 ms
Lead Channel Setting Pacing Pulse Width: 0.5 ms
Lead Channel Setting Sensing Sensitivity: 0.5 mV
MDC IDC LEAD IMPLANT DT: 20110309
MDC IDC LEAD IMPLANT DT: 20110309
MDC IDC LEAD LOCATION: 753858
MDC IDC MSMT BATTERY REMAINING LONGEVITY: 71 mo
MDC IDC MSMT BATTERY REMAINING PERCENTAGE: 86 %
MDC IDC MSMT LEADCHNL LV PACING THRESHOLD PULSEWIDTH: 0.5 ms
MDC IDC MSMT LEADCHNL RA PACING THRESHOLD AMPLITUDE: 1.125 V
MDC IDC MSMT LEADCHNL RA PACING THRESHOLD PULSEWIDTH: 0.5 ms
MDC IDC MSMT LEADCHNL RV IMPEDANCE VALUE: 580 Ohm
MDC IDC MSMT LEADCHNL RV PACING THRESHOLD PULSEWIDTH: 0.5 ms
MDC IDC SET LEADCHNL LV PACING AMPLITUDE: 2 V
MDC IDC STAT BRADY AS VS PERCENT: 2.8 %
Pulse Gen Serial Number: 9761609

## 2017-07-24 ENCOUNTER — Encounter: Payer: Self-pay | Admitting: Gastroenterology

## 2017-08-18 ENCOUNTER — Ambulatory Visit (INDEPENDENT_AMBULATORY_CARE_PROVIDER_SITE_OTHER): Payer: Medicare Other

## 2017-08-18 DIAGNOSIS — Z95810 Presence of automatic (implantable) cardiac defibrillator: Secondary | ICD-10-CM

## 2017-08-18 DIAGNOSIS — I5022 Chronic systolic (congestive) heart failure: Secondary | ICD-10-CM

## 2017-08-18 NOTE — Progress Notes (Signed)
EPIC Encounter for ICM Monitoring  Patient Name: Stacy Grimes is a 67 y.o. female Date: 08/18/2017 Primary Care Physican: Cyndi Bender, PA-C Primary [place] Electrophysiologist: Caryl Comes Dry Weight:Previous weight200lbs Bi-V Pacing: 98%                                                   Attempted call to husband.   Left detailed message, per DPR, regarding transmission.  Transmission reviewed.    Thoracic impedance normal.  Prescribed dosage: Furosemide 40 mg 1 tablet twice a day  Labs: 06/17/2017 Creatinine 1.00, BUN 13, Potassium 5.3, Sodium 142, EGFR 58-67 11/02/2016 Creatinine0.87, BUN13, Potassium3.4, Sodium141, EGFR>60 09/10/2016 Creatinine0.93, BUN13, Potassium4.1, [id number], [id number]  03/28/2016 Creatinine1.04, BUN13, Potassium4.7, UFCZGQ360, [id number]  Recommendations: Left voice mail with ICM number and encouraged to call if experiencing any fluid symptoms.  Follow-up plan: ICM clinic phone appointment on 09/30/2017.    Copy of ICM check sent to Dr. Caryl Comes.   3 month ICM trend: 08/18/2017    1 Year ICM trend:       [name], RN 08/18/2017 10:31 AM

## 2017-09-02 DIAGNOSIS — J449 Chronic obstructive pulmonary disease, unspecified: Secondary | ICD-10-CM | POA: Diagnosis not present

## 2017-09-02 DIAGNOSIS — Z9181 History of falling: Secondary | ICD-10-CM | POA: Diagnosis not present

## 2017-09-02 DIAGNOSIS — Z1339 Encounter for screening examination for other mental health and behavioral disorders: Secondary | ICD-10-CM | POA: Diagnosis not present

## 2017-09-02 DIAGNOSIS — E1165 Type 2 diabetes mellitus with hyperglycemia: Secondary | ICD-10-CM | POA: Diagnosis not present

## 2017-09-02 DIAGNOSIS — J9610 Chronic respiratory failure, unspecified whether with hypoxia or hypercapnia: Secondary | ICD-10-CM | POA: Diagnosis not present

## 2017-09-02 DIAGNOSIS — M545 Low back pain: Secondary | ICD-10-CM | POA: Diagnosis not present

## 2017-09-02 DIAGNOSIS — Z1331 Encounter for screening for depression: Secondary | ICD-10-CM | POA: Diagnosis not present

## 2017-09-30 ENCOUNTER — Ambulatory Visit (INDEPENDENT_AMBULATORY_CARE_PROVIDER_SITE_OTHER): Payer: Medicare Other

## 2017-09-30 ENCOUNTER — Ambulatory Visit (INDEPENDENT_AMBULATORY_CARE_PROVIDER_SITE_OTHER): Payer: Medicare Other | Admitting: *Deleted

## 2017-09-30 DIAGNOSIS — I5022 Chronic systolic (congestive) heart failure: Secondary | ICD-10-CM | POA: Diagnosis not present

## 2017-09-30 DIAGNOSIS — Z95810 Presence of automatic (implantable) cardiac defibrillator: Secondary | ICD-10-CM

## 2017-09-30 DIAGNOSIS — I428 Other cardiomyopathies: Secondary | ICD-10-CM

## 2017-09-30 NOTE — Progress Notes (Signed)
Remote ICD transmission.   

## 2017-09-30 NOTE — Progress Notes (Signed)
EPIC Encounter for ICM Monitoring  Patient Name: Stacy Grimes is a 67 y.o. female Date: 09/30/2017 Primary Care Physican: Conroy, Nathan, PA-C Primary Cardiologist:Klein Electrophysiologist: Klein Dry Weight:Previous weight200lbs Bi-V Pacing: 98%      Attempted call to husband and unable to reach.  Left detailed message, per DPR, regarding transmission.  Transmission reviewed.    Thoracic impedance normal.  Prescribed dosage: Furosemide 40 mg 1 tablet twice a day  Labs: 06/17/2017 Creatinine 1.00, BUN 13, Potassium 5.3, Sodium 142, EGFR 58-67 11/02/2016 Creatinine0.87, BUN13, Potassium3.4, Sodium141, EGFR>60 09/10/2016 Creatinine0.93, BUN13, Potassium4.1, Sodium144, EGFR64-74  03/28/2016 Creatinine1.04, BUN13, Potassium4.7, Sodium142, EGFR56-65  Recommendations:  Left voice mail with ICM number and encouraged to call if experiencing any fluid symptoms.  Follow-up plan: ICM clinic phone appointment on 11/03/2017.    Copy of ICM check sent to Dr. Klein.   3 month ICM trend: 09/30/2017    1 Year ICM trend:        S , RN 09/30/2017 10:27 AM   

## 2017-10-02 ENCOUNTER — Encounter: Payer: Self-pay | Admitting: Cardiology

## 2017-10-04 ENCOUNTER — Other Ambulatory Visit: Payer: Self-pay | Admitting: Physician Assistant

## 2017-10-23 ENCOUNTER — Telehealth: Payer: Self-pay | Admitting: Internal Medicine

## 2017-10-23 ENCOUNTER — Telehealth: Payer: Self-pay | Admitting: Cardiovascular Disease

## 2017-10-23 NOTE — Telephone Encounter (Addendum)
Spoke with Catering manager at [facility] regarding a refill on Carvedilol.  The instructions per Vicente Males for the last refill on 09/05/2017 read Carvedilol 25 mg take one tablet twice a day, the patient  was told then to follow up with Dr. Humphrey Rolls before any further refills given. The Carvedilol was prescribed by Dr. Stark Klein.   The patient is requesting [place] to refill Carvedilol until her new patient appointment with Dr. Fletcher Anon because she is switching from Dr. Humphrey Rolls to [facility] however, the Carvedilol is not on her medication list where Dr. Caryl Comes saw her at her last office visit on  07/16/2017.  The patient will need to contact Dr. Laurelyn Sickle office to see if they will refill the Carvedilol until her new patient appointment with Dr. Fletcher Anon on 11/13/2017.

## 2017-10-23 NOTE — Telephone Encounter (Signed)
°*  STAT* If patient is at the pharmacy, call can be transferred to refill team.   1. Which medications need to be refilled? (please list name of each medication and dose if known) Carvedilol 25 mg she takes half pill in am and full one in evening   2. Which pharmacy/location (including street and city if local pharmacy) is medication to be sent to? [facility] in [place]   3. Do they need a 30 day or 90 day supply? 30 day    Pt is switching over from Dr Laurelyn Sickle office  Patient has none left for medications  Is asking if we can please send this in  Please advise

## 2017-10-23 NOTE — Telephone Encounter (Signed)
Please call regarding Carvedilol rx

## 2017-10-24 ENCOUNTER — Telehealth: Payer: Self-pay | Admitting: Cardiovascular Disease

## 2017-10-24 MED ORDER — CARVEDILOL 25 MG PO TABS: ORAL_TABLET | ORAL | 0 refills | Status: DC

## 2017-10-24 NOTE — Telephone Encounter (Signed)
Called pharmacy staff. States Dr Humphrey Rolls has sent in a refill for patient with carvedilol 25 mg two times a day. It is enough to get patient to new appointment with [place] on 11/14/17. They are filling the one from Dr Laurelyn Sickle office and will cancel the one from Dr Fletcher Anon. Nothing further needed.

## 2017-10-24 NOTE — Telephone Encounter (Signed)
Per Dr. Fletcher Anon okay to refill until patient comes to her appointment on Oct. 10, 2019.

## 2017-10-24 NOTE — Telephone Encounter (Signed)
Attempted to call pharmacy. Closed and will reopen at 2 pm.

## 2017-10-24 NOTE — Telephone Encounter (Signed)
Rx sent to [facility] in [place] for Carvedilol 25 mg take one half (12.5 mg) tablet & 25 mg one tablet in the pm for 45 tablets and 0 refills.

## 2017-10-24 NOTE — Telephone Encounter (Signed)
Please call pharmacy regarding directions for Carvedilol

## 2017-10-29 ENCOUNTER — Encounter: Payer: Self-pay | Admitting: *Deleted

## 2017-11-03 ENCOUNTER — Ambulatory Visit (INDEPENDENT_AMBULATORY_CARE_PROVIDER_SITE_OTHER): Payer: Medicare Other

## 2017-11-03 DIAGNOSIS — I5022 Chronic systolic (congestive) heart failure: Secondary | ICD-10-CM

## 2017-11-03 DIAGNOSIS — Z95810 Presence of automatic (implantable) cardiac defibrillator: Secondary | ICD-10-CM

## 2017-11-03 LAB — CUP PACEART REMOTE DEVICE CHECK
Battery Remaining Longevity: 69 mo
Battery Voltage: 3.01 V
Brady Statistic RA Percent Paced: 1 %
Date Time Interrogation Session: 20190827060017
HIGH POWER IMPEDANCE MEASURED VALUE: 54 Ohm
HIGH POWER IMPEDANCE MEASURED VALUE: 54 Ohm
Implantable Lead Implant Date: 20110309
Implantable Lead Location: 753859
Implantable Lead Model: 7121
Implantable Pulse Generator Implant Date: 20180814
Lead Channel Impedance Value: 730 Ohm
Lead Channel Pacing Threshold Amplitude: 0.75 V
Lead Channel Pacing Threshold Amplitude: 0.75 V
Lead Channel Pacing Threshold Amplitude: 1.125 V
Lead Channel Pacing Threshold Pulse Width: 0.5 ms
Lead Channel Sensing Intrinsic Amplitude: 11.7 mV
Lead Channel Setting Pacing Amplitude: 2 V
Lead Channel Setting Sensing Sensitivity: 0.5 mV
MDC IDC LEAD IMPLANT DT: 20110309
MDC IDC LEAD IMPLANT DT: 20110309
MDC IDC LEAD LOCATION: 753858
MDC IDC LEAD LOCATION: 753860
MDC IDC MSMT BATTERY REMAINING PERCENTAGE: 83 %
MDC IDC MSMT LEADCHNL LV PACING THRESHOLD PULSEWIDTH: 0.5 ms
MDC IDC MSMT LEADCHNL RA IMPEDANCE VALUE: 350 Ohm
MDC IDC MSMT LEADCHNL RA SENSING INTR AMPL: 5 mV
MDC IDC MSMT LEADCHNL RV IMPEDANCE VALUE: 610 Ohm
MDC IDC MSMT LEADCHNL RV PACING THRESHOLD PULSEWIDTH: 0.5 ms
MDC IDC PG SERIAL: 9761609
MDC IDC SET LEADCHNL LV PACING PULSEWIDTH: 0.5 ms
MDC IDC SET LEADCHNL RA PACING AMPLITUDE: 2.25 V
MDC IDC SET LEADCHNL RV PACING AMPLITUDE: 2.5 V
MDC IDC SET LEADCHNL RV PACING PULSEWIDTH: 0.5 ms
MDC IDC STAT BRADY AP VP PERCENT: 1 %
MDC IDC STAT BRADY AP VS PERCENT: 1 %
MDC IDC STAT BRADY AS VP PERCENT: 98 %
MDC IDC STAT BRADY AS VS PERCENT: 2.1 %

## 2017-11-04 ENCOUNTER — Telehealth: Payer: Self-pay

## 2017-11-04 NOTE — Telephone Encounter (Signed)
Remote ICM transmission received.  Attempted call to husband and left detailed message, per DPR, regarding transmission and next ICM scheduled for 12/08/2017.  Advised to return call for any fluid symptoms or questions.

## 2017-11-04 NOTE — Progress Notes (Signed)
EPIC Encounter for ICM Monitoring  Patient Name: AMAAL DIMARTINO is a 67 y.o. female Date: 11/04/2017 Primary Care Physican: Cyndi Bender, PA-C Primary [place] Electrophysiologist: Caryl Comes Dry Weight: Previous weight200lbs Bi-V Pacing: 98%       Attempted call to husband and unable to reach.  Left detailed message, per DPR, regarding transmission.  Transmission reviewed.    Thoracic impedance abnormal suggesting dryness starting 10/26/2017.  Prescribed: Furosemide 40 mg 1 tablet twice a day  Labs: 06/17/2017 Creatinine 1.00, BUN 13, Potassium 5.3, Sodium 142, EGFR 58-67 11/02/2016 Creatinine0.87, BUN13, Potassium3.4, [id number], EGFR>60 09/10/2016 Creatinine0.93, BUN13, Potassium4.1, [id number], UXLK44-01  03/28/2016 Creatinine1.04, BUN13, Potassium4.7, [id number], QIHK74-25  Recommendations:  Left voice mail with ICM number and encouraged to call if experiencing any fluid symptoms.  Follow-up plan: ICM clinic phone appointment on 12/08/2017.   Office appointment scheduled 11/13/2017 with Dr. Fletcher Anon.    Copy of ICM check sent to Dr. Caryl Comes.   3 month ICM trend: 11/03/2017    1 Year ICM trend:       [name], RN 11/04/2017 11:08 AM

## 2017-11-07 ENCOUNTER — Other Ambulatory Visit: Payer: Self-pay | Admitting: Internal Medicine

## 2017-11-08 ENCOUNTER — Other Ambulatory Visit: Payer: Self-pay | Admitting: Student

## 2017-11-08 MED ORDER — DIGOXIN 125 MCG PO TABS: ORAL_TABLET | ORAL | 5 refills | Status: DC

## 2017-11-12 ENCOUNTER — Encounter: Payer: Self-pay | Admitting: *Deleted

## 2017-11-13 ENCOUNTER — Ambulatory Visit: Payer: Medicare Other | Admitting: Cardiovascular Disease

## 2017-12-03 DIAGNOSIS — Z79899 Other long term (current) drug therapy: Secondary | ICD-10-CM | POA: Diagnosis not present

## 2017-12-03 DIAGNOSIS — Z23 Encounter for immunization: Secondary | ICD-10-CM | POA: Diagnosis not present

## 2017-12-03 DIAGNOSIS — J9610 Chronic respiratory failure, unspecified whether with hypoxia or hypercapnia: Secondary | ICD-10-CM | POA: Diagnosis not present

## 2017-12-03 DIAGNOSIS — J449 Chronic obstructive pulmonary disease, unspecified: Secondary | ICD-10-CM | POA: Diagnosis not present

## 2017-12-03 DIAGNOSIS — M545 Low back pain: Secondary | ICD-10-CM | POA: Diagnosis not present

## 2017-12-03 DIAGNOSIS — E1165 Type 2 diabetes mellitus with hyperglycemia: Secondary | ICD-10-CM | POA: Diagnosis not present

## 2017-12-03 DIAGNOSIS — M109 Gout, unspecified: Secondary | ICD-10-CM | POA: Diagnosis not present

## 2017-12-08 ENCOUNTER — Ambulatory Visit (INDEPENDENT_AMBULATORY_CARE_PROVIDER_SITE_OTHER): Payer: Medicare Other

## 2017-12-08 DIAGNOSIS — I5022 Chronic systolic (congestive) heart failure: Secondary | ICD-10-CM | POA: Diagnosis not present

## 2017-12-08 DIAGNOSIS — Z95810 Presence of automatic (implantable) cardiac defibrillator: Secondary | ICD-10-CM | POA: Diagnosis not present

## 2017-12-08 NOTE — Progress Notes (Signed)
EPIC Encounter for ICM Monitoring  Patient Name: Stacy Grimes is a 67 y.o. female Date: 12/08/2017 Primary Care Physican: Cyndi Bender, PA-C Primary [place] Electrophysiologist: Caryl Comes Dry Weight: Previous weight200lbs Bi-V Pacing: 98%       Spoke with husband.  Patient's memory loss is getting worse and husband is having difficulty with taking care of her. He reported her memory loss does came from medication she took in the past.  He is feeling frustrated. Patients daughter died in an apartment fire about 3 weeks ago and she is repeatedly asking the same questions over and over.  Provide resource of [facility] [place] and encouraged him to call for support.    Thoracic impedance normal but was abnormal suggesting fluid accumulation from 11/08/2017 - 11/16/2017.   Prescribed: Furosemide 40 mg 1 tablet twice a day  Labs: 06/17/2017 Creatinine 1.00, BUN 13, Potassium 5.3, Sodium 142, EGFR 58-67  Recommendations: No changes.    Encouraged to call for fluid symptoms.  Follow-up plan: ICM clinic phone appointment on 01/08/2018.     Copy of ICM check sent to Dr. Caryl Comes.   3 month ICM trend: 12/08/2017    1 Year ICM trend:       [name], RN 12/08/2017 3:51 PM

## 2017-12-15 DIAGNOSIS — R413 Other amnesia: Secondary | ICD-10-CM | POA: Diagnosis not present

## 2017-12-15 DIAGNOSIS — R112 Nausea with vomiting, unspecified: Secondary | ICD-10-CM | POA: Diagnosis not present

## 2017-12-15 DIAGNOSIS — Z6835 Body mass index (BMI) 35.0-35.9, adult: Secondary | ICD-10-CM | POA: Diagnosis not present

## 2017-12-15 DIAGNOSIS — F4321 Adjustment disorder with depressed mood: Secondary | ICD-10-CM | POA: Diagnosis not present

## 2018-01-08 ENCOUNTER — Ambulatory Visit (INDEPENDENT_AMBULATORY_CARE_PROVIDER_SITE_OTHER): Payer: Medicare Other

## 2018-01-08 DIAGNOSIS — I5022 Chronic systolic (congestive) heart failure: Secondary | ICD-10-CM

## 2018-01-08 DIAGNOSIS — I428 Other cardiomyopathies: Secondary | ICD-10-CM

## 2018-01-08 DIAGNOSIS — Z95810 Presence of automatic (implantable) cardiac defibrillator: Secondary | ICD-10-CM | POA: Diagnosis not present

## 2018-01-08 NOTE — Progress Notes (Signed)
EPIC Encounter for ICM Monitoring  Patient Name: Stacy Grimes is a 67 y.o. female Date: 01/08/2018 Primary Care Physican: Cyndi Bender, PA-C Primary [place] Electrophysiologist: Caryl Comes Bi-V Pacing: 97% Last Weight: 200lbs Today's Weight:  unknown      Spoke with husband.  Heart Failure questions reviewed, pt asymptomatic.  Patient uses salt but he tries to limit it as much as he can.  Patient has neurologist appointment after the first of the year due to memory loss.  She is prediabetic.   Thoracic impedance abnormal suggesting fluid accumulation starting 01/07/2018. Impedance below baseline between 12/21/2017 - 01/01/2018.  Prescribed: Furosemide 40 mg 1 tablet twice a day   Labs: 06/17/2017 Creatinine 1.00, BUN 13, Potassium 5.3, Sodium 142, EGFR 58-67  Recommendations: Advised to increase Furosemide to 60 mg twice a day x 3 days and then return to 40 mg twice a day.  Follow-up plan: ICM clinic phone appointment on 01/22/2018 to recheck fluid levels.   Office appointment scheduled 01/23/2018 with Dr. Fletcher Anon.    Copy of ICM check sent to Dr. Caryl Comes.   3 month ICM trend: 01/08/2018    1 Year ICM trend:       [name], RN 01/08/2018 10:38 AM

## 2018-01-08 NOTE — Progress Notes (Signed)
Remote ICD transmission.   

## 2018-01-13 ENCOUNTER — Encounter: Payer: Self-pay | Admitting: Cardiology

## 2018-01-22 ENCOUNTER — Ambulatory Visit (INDEPENDENT_AMBULATORY_CARE_PROVIDER_SITE_OTHER): Payer: Medicare Other

## 2018-01-22 DIAGNOSIS — I5022 Chronic systolic (congestive) heart failure: Secondary | ICD-10-CM

## 2018-01-22 DIAGNOSIS — Z95810 Presence of automatic (implantable) cardiac defibrillator: Secondary | ICD-10-CM

## 2018-01-23 ENCOUNTER — Ambulatory Visit: Payer: Medicare Other | Admitting: Cardiovascular Disease

## 2018-01-23 NOTE — Progress Notes (Signed)
EPIC Encounter for ICM Monitoring  Patient Name: Stacy Grimes is a 67 y.o. female Date: 01/23/2018 Primary Care Physican: Cyndi Bender, PA-C Primary [place] Electrophysiologist: Caryl Comes Bi-V Pacing: 97% Last Weight: 200lbs Today's Weight:  unknown                                                          Transmission reviewed.   Thoracic impedance returned to normal since 01/08/2018 transmission and recommended to take extra Furosemide.  Prescribed: Furosemide 40 mg 1 tablet twice a day   Labs: 06/17/2017 Creatinine 1.00, BUN 13, Potassium 5.3, Sodium 142, EGFR 58-67  Recommendations: None  Follow-up plan: ICM clinic phone appointment on 02/05/2018.    Copy of ICM check sent to Dr. Caryl Comes.   3 month ICM trend: 02/01/2018    1 Year ICM trend:       [name], RN 01/23/2018 1:40 PM

## 2018-02-05 ENCOUNTER — Ambulatory Visit (INDEPENDENT_AMBULATORY_CARE_PROVIDER_SITE_OTHER): Payer: Medicare Other

## 2018-02-05 DIAGNOSIS — Z95810 Presence of automatic (implantable) cardiac defibrillator: Secondary | ICD-10-CM | POA: Diagnosis not present

## 2018-02-05 DIAGNOSIS — I5022 Chronic systolic (congestive) heart failure: Secondary | ICD-10-CM | POA: Diagnosis not present

## 2018-02-06 ENCOUNTER — Telehealth: Payer: Self-pay

## 2018-02-06 NOTE — Telephone Encounter (Signed)
Remote ICM transmission received.  Attempted call to husband regarding ICM remote transmission and left message to return call.

## 2018-02-06 NOTE — Progress Notes (Signed)
EPIC Encounter for ICM Monitoring  Patient Name: Stacy Grimes is a 68 y.o. female Date: 02/06/2018 Primary Care Physican: Cyndi Bender, PA-C Primary [place] Electrophysiologist: Caryl Comes Bi-V Pacing: 97% Last Weight:200lbs Today's Weight: unknown  Spoke with husband. Heart Failure questions reviewed, pt asymptomatic.   Thoracic impedance abnormal suggesting fluid accumulation starting 01/30/2018.   Prescribed: Furosemide 40 mg 1 tablet twice a day   Labs: 06/17/2017 Creatinine 1.00, BUN 13, Potassium 5.3, Sodium 142, EGFR 58-67  Recommendations:  Advised to increase Furosemide to 1.5 tablets (60 mg total) twice a day x 3 days and then return to prior dosage of 40 mg twice a day.  Follow-up plan: ICM clinic phone appointment on 02/09/2018 (manual send) to recheck fluid levels.       Copy of ICM check sent to Dr. Caryl Comes.   3 month ICM trend: 02/05/2018    1 Year ICM trend:       [name], RN 02/06/2018 10:07 AM

## 2018-02-10 NOTE — Progress Notes (Signed)
No ICM remote transmission received for fluid recheck 02/09/2018 and next ICM transmission scheduled for 03/09/2018.

## 2018-02-28 LAB — CUP PACEART REMOTE DEVICE CHECK
Battery Remaining Longevity: 65 mo
Brady Statistic AP VP Percent: 1 %
Brady Statistic AP VS Percent: 1 %
Brady Statistic AS VP Percent: 97 %
Brady Statistic AS VS Percent: 2.5 %
Brady Statistic RA Percent Paced: 1 %
Date Time Interrogation Session: 20191205070016
HIGH POWER IMPEDANCE MEASURED VALUE: 55 Ohm
HighPow Impedance: 55 Ohm
Implantable Lead Implant Date: 20110309
Implantable Lead Implant Date: 20110309
Implantable Lead Location: 753858
Implantable Lead Location: 753859
Implantable Lead Model: 7121
Lead Channel Impedance Value: 360 Ohm
Lead Channel Pacing Threshold Amplitude: 0.75 V
Lead Channel Pacing Threshold Pulse Width: 0.5 ms
Lead Channel Pacing Threshold Pulse Width: 0.5 ms
Lead Channel Pacing Threshold Pulse Width: 0.5 ms
Lead Channel Sensing Intrinsic Amplitude: 5 mV
Lead Channel Setting Pacing Amplitude: 2 V
Lead Channel Setting Pacing Amplitude: 2.5 V
Lead Channel Setting Pacing Pulse Width: 0.5 ms
MDC IDC LEAD IMPLANT DT: 20110309
MDC IDC LEAD LOCATION: 753860
MDC IDC MSMT BATTERY REMAINING PERCENTAGE: 80 %
MDC IDC MSMT BATTERY VOLTAGE: 2.98 V
MDC IDC MSMT LEADCHNL LV IMPEDANCE VALUE: 680 Ohm
MDC IDC MSMT LEADCHNL LV PACING THRESHOLD AMPLITUDE: 0.75 V
MDC IDC MSMT LEADCHNL RA PACING THRESHOLD AMPLITUDE: 0.875 V
MDC IDC MSMT LEADCHNL RV IMPEDANCE VALUE: 580 Ohm
MDC IDC MSMT LEADCHNL RV SENSING INTR AMPL: 11.7 mV
MDC IDC PG IMPLANT DT: 20180814
MDC IDC SET LEADCHNL LV PACING PULSEWIDTH: 0.5 ms
MDC IDC SET LEADCHNL RA PACING AMPLITUDE: 2.25 V
MDC IDC SET LEADCHNL RV SENSING SENSITIVITY: 0.5 mV
Pulse Gen Serial Number: 9761609

## 2018-03-09 ENCOUNTER — Ambulatory Visit (INDEPENDENT_AMBULATORY_CARE_PROVIDER_SITE_OTHER): Payer: Medicare Other

## 2018-03-09 DIAGNOSIS — I5022 Chronic systolic (congestive) heart failure: Secondary | ICD-10-CM

## 2018-03-09 DIAGNOSIS — Z95810 Presence of automatic (implantable) cardiac defibrillator: Secondary | ICD-10-CM

## 2018-03-10 NOTE — Progress Notes (Signed)
EPIC Encounter for ICM Monitoring  Patient Name: Stacy Grimes is a 68 y.o. female Date: 03/10/2018 Primary Care Physican: Cyndi Bender, PA-C Primary [place] Electrophysiologist: Caryl Comes Bi-V Pacing: 97% Last Weight:200lbs Today's Weight: unknown  Spoke with husband. Heart Failure questions reviewed, pt asymptomatic.   Thoracic impedance normal.  Prescribed: Furosemide 40 mg 1 tablet twice a day   Labs: 06/17/2017 Creatinine 1.00, BUN 13, Potassium 5.3, Sodium 142, EGFR 58-67  Recommendations:  No changes. Encouraged to call for fluid symptoms.  Follow-up plan: ICM clinic phone appointment on 04/10/2018.       Copy of ICM check sent to Dr. Caryl Comes.    3 month ICM trend: 03/09/2018    1 Year ICM trend:       [name], RN 03/10/2018 12:48 PM

## 2018-03-24 DIAGNOSIS — J9610 Chronic respiratory failure, unspecified whether with hypoxia or hypercapnia: Secondary | ICD-10-CM | POA: Diagnosis not present

## 2018-03-24 DIAGNOSIS — I509 Heart failure, unspecified: Secondary | ICD-10-CM | POA: Diagnosis not present

## 2018-03-24 DIAGNOSIS — J441 Chronic obstructive pulmonary disease with (acute) exacerbation: Secondary | ICD-10-CM | POA: Diagnosis not present

## 2018-03-24 DIAGNOSIS — E1165 Type 2 diabetes mellitus with hyperglycemia: Secondary | ICD-10-CM | POA: Diagnosis not present

## 2018-03-27 ENCOUNTER — Ambulatory Visit: Payer: Medicare Other | Admitting: Cardiovascular Disease

## 2018-04-03 ENCOUNTER — Emergency Department: Payer: Medicare Other

## 2018-04-03 ENCOUNTER — Emergency Department
Admission: EM | Admit: 2018-04-03 | Discharge: 2018-04-03 | Disposition: A | Discharge status: Home / Self Care | Payer: Medicare Other | Attending: Emergency Medicine | Admitting: Emergency Medicine

## 2018-04-03 ENCOUNTER — Encounter: Payer: Self-pay | Admitting: Emergency Medicine

## 2018-04-03 ENCOUNTER — Other Ambulatory Visit: Payer: Self-pay

## 2018-04-03 DIAGNOSIS — S80911A Unspecified superficial injury of right knee, initial encounter: Secondary | ICD-10-CM | POA: Diagnosis not present

## 2018-04-03 DIAGNOSIS — S79911A Unspecified injury of right hip, initial encounter: Secondary | ICD-10-CM | POA: Diagnosis not present

## 2018-04-03 DIAGNOSIS — J449 Chronic obstructive pulmonary disease, unspecified: Secondary | ICD-10-CM | POA: Diagnosis not present

## 2018-04-03 DIAGNOSIS — Z7982 Long term (current) use of aspirin: Secondary | ICD-10-CM | POA: Insufficient documentation

## 2018-04-03 DIAGNOSIS — I5022 Chronic systolic (congestive) heart failure: Secondary | ICD-10-CM | POA: Insufficient documentation

## 2018-04-03 DIAGNOSIS — Z87891 Personal history of nicotine dependence: Secondary | ICD-10-CM | POA: Insufficient documentation

## 2018-04-03 DIAGNOSIS — W19XXXA Unspecified fall, initial encounter: Secondary | ICD-10-CM | POA: Insufficient documentation

## 2018-04-03 DIAGNOSIS — Y929 Unspecified place or not applicable: Secondary | ICD-10-CM | POA: Insufficient documentation

## 2018-04-03 DIAGNOSIS — I110 Hypertensive heart disease with heart failure: Secondary | ICD-10-CM | POA: Insufficient documentation

## 2018-04-03 DIAGNOSIS — R52 Pain, unspecified: Secondary | ICD-10-CM | POA: Diagnosis not present

## 2018-04-03 DIAGNOSIS — S8991XA Unspecified injury of right lower leg, initial encounter: Secondary | ICD-10-CM

## 2018-04-03 DIAGNOSIS — Z79899 Other long term (current) drug therapy: Secondary | ICD-10-CM | POA: Insufficient documentation

## 2018-04-03 DIAGNOSIS — M25551 Pain in right hip: Secondary | ICD-10-CM | POA: Diagnosis not present

## 2018-04-03 DIAGNOSIS — M25561 Pain in right knee: Secondary | ICD-10-CM | POA: Diagnosis not present

## 2018-04-03 DIAGNOSIS — M7989 Other specified soft tissue disorders: Secondary | ICD-10-CM | POA: Diagnosis not present

## 2018-04-03 DIAGNOSIS — Y939 Activity, unspecified: Secondary | ICD-10-CM | POA: Diagnosis not present

## 2018-04-03 DIAGNOSIS — Y999 Unspecified external cause status: Secondary | ICD-10-CM | POA: Diagnosis not present

## 2018-04-03 MED ORDER — OXYCODONE-ACETAMINOPHEN 5-325 MG PO TABS
1.0000 | ORAL_TABLET | Freq: Once | ORAL | Status: AC
  Administered 2018-04-03: 1 via ORAL
  Filled 2018-04-03: qty 1

## 2018-04-03 NOTE — Care Management Note (Signed)
Case Management Note  Patient Details  Name: Stacy Grimes MRN: [id number] Date of Birth: Mar 04, 1950  Subjective/Objective:  Patient is being seen in the emergency room after a fall this past Wednesday with resulting knee pain.  Husband is at the bedside and reports that the patient has had trouble walking the past 2 days.  Patient has a history of COPD and CHF, she is on chronic O2 at 3L from [place], husband is her primary care giver.  Husband reports that recently she has been having trouble with her short term memory and has an appointment with Dr. Eulas Post over at [facility] coming up soon.   Patient is current with PCP and also sees several specialists.  RNCM consulted for home needs.  Patient is having knee pain due to knee sprain from recent fall.  Patient has a walker at home that she can use, husband also reports she has a motorized scooter.  Patient and husband offered home health PT and OT and they agree.  [place] agency choice offered, patient has no preference and is okay with [facility].  Jana Half with [facility] will accept the referral.  Patient and husband would also like a bedside commode.  Bedside commode ordered by MD and delivered to patient room before discharge by Floydene Flock with [facility].  Bedside RN wrapped the patient's knee with an ace wrap and she is ready for discharge. Doran Clay RN BSN Care Management [phone]                  Action/Plan:   Expected Discharge Date:   04/03/18               Expected Discharge Plan:  [place]  In-House Referral:     Discharge planning Services  CM Consult  Post Acute Care Choice:  Home Health, Durable Medical Equipment Choice offered to:  Patient, Spouse  DME Arranged:  3-N-1 DME Agency:  [place]:  PT, OT, Nurse's Aide [place] Agency:  [facility]  Status of Service:  Completed, signed off  If discussed at [place] of Stay Meetings, dates  discussed:    Additional Comments:  Shelbie Hutching, RN 04/03/2018, 1:43 PM

## 2018-04-03 NOTE — ED Provider Notes (Addendum)
[facility] Emergency Department Provider Note  ____________________________________________   I have reviewed the triage vital signs and the nursing notes. Where available I have reviewed prior notes and, if possible and indicated, outside hospital notes.    HISTORY  Chief Complaint Knee Injury    HPI Stacy Grimes is a 68 y.o. female  Who states she fell 2 days ago did not hit her head noncyclical fall would not be here except for the fact that she has pain in her right hip and her right knee.  She states the pain is made it difficult for her to get around.  She states she does not really been able to bear much weight.  She has not had any other injury.  And again the fall happened 2 days ago non-syncopal she states she clearly remembers.  It was a trip she states.  No headache since that time no other complaints.  Bruising noted to the right knee.   Her injury no other alleviating or aggravating factors, has tried home remedies without success Past Medical History:  Diagnosis Date  . AAA (abdominal aortic aneurysm) ([place])   . Anginal pain ([place])   . Anxiety   . Arthritis   . Automatic implantable cardioverter-defibrillator in situ   . Biventricular ICD  [employer]   . CHF (congestive heart failure) ([place])   . Chronic systolic heart failure ([place])   . COPD (chronic obstructive pulmonary disease) ([place])   . Deafness    left ear  . Depression   . Dizziness   . Dysrhythmia   . Fracture    history of spinal fracture  . GERD (gastroesophageal reflux disease)   . Hypertension   . Hypothyroidism   . Non-ischemic cardiomyopathy ([place])   . Oxygen dependent   . Palpitations   . Shortness of breath dyspnea   . Sleep apnea   . Wheezing     Patient Active Problem List   Diagnosis Date Noted  . 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 Diverticulosis of colon (without mention of hemorrhage) 08/31/2012  . Fatigue 08/10/2012  . Personal history of colonic polyps  07/02/2012  . Dysphagia, unspecified(787.20) 07/02/2012  . Unspecified constipation 07/02/2012  . 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 HYPERTENSION, BENIGN 03/24/2009  . SLEEP APNEA, OBSTRUCTIVE 03/23/2009  . CARDIOMYOPATHY, PRIMARY, DILATED 03/23/2009  . SYSTOLIC HEART FAILURE, CHRONIC 03/23/2009  . PALPITATIONS 03/23/2009  . MURMUR 03/23/2009  . COUGH 03/23/2009    Past Surgical History:  Procedure Laterality Date  . ABDOMINAL HYSTERECTOMY    . BIV ICD GENERATOR CHANGEOUT N/A 09/17/2016   Procedure: BiV ICD Generator Changeout;  Surgeon: Deboraha Sprang, MD;  Location: [facility];  Service: Cardiovascular;  Laterality: N/A;  . BLADDER SURGERY    . CARDIAC CATHETERIZATION    . CARDIAC DEFIBRILLATOR PLACEMENT  4 yrs ago   pacemaker  . CATARACT EXTRACTION W/PHACO Left 09/01/2014   Procedure: CATARACT EXTRACTION PHACO AND INTRAOCULAR LENS PLACEMENT (IOC);  Surgeon: Lyla Glassing, MD;  Location: [facility] ORS;  Service: Ophthalmology;  Laterality: Left;  [place]: 1:12.1   . CATARACT EXTRACTION W/PHACO Right 09/29/2014   Procedure: CATARACT EXTRACTION PHACO AND INTRAOCULAR LENS PLACEMENT (IOC);  Surgeon: Lyla Glassing, MD;  Location: [facility] OR[facility];  Service: Ophthalmology;  Laterality: Right;  [place]: 00:52.7   . CHOLECYSTECTOMY    . COLON SURGERY    . COLONOSCOPY    . COLONOSCOPY N/A 08/31/2012   Procedure: COLONOSCOPY;  Surgeon: Inda Castle,  MD;  Location: [facility];  Service: Endoscopy;  Laterality: N/A;  . ESOPHAGOGASTRODUODENOSCOPY N/A 07/06/2012   Procedure: ESOPHAGOGASTRODUODENOSCOPY (EGD);  Surgeon: Lafayette Dragon, MD;  Location: [facility];  Service: Endoscopy;  Laterality: N/A;  . KNEE SURGERY Right   . MIDDLE EAR SURGERY    . SAVORY DILATION N/A 07/06/2012   Procedure: SAVORY DILATION;  Surgeon: Lafayette Dragon, MD;  Location: [facility];  Service: Endoscopy;  Laterality: N/A;  . WRIST SURGERY Right     Prior to Admission medications    Medication Sig Start Date End Date Taking? Authorizing Provider  albuterol (PROVENTIL HFA;VENTOLIN HFA) 108 (90 Base) MCG/ACT inhaler Inhale 2 puffs into the lungs every 6 (six) hours as needed. 11/02/16   Schaevitz, Randall An, MD  allopurinol (ZYLOPRIM) 300 MG tablet Take 300 mg by mouth 2 (two) times daily.     [provider]  aspirin 81 MG EC tablet Take 81 mg by mouth daily.      [provider]  carvedilol (COREG) 25 MG tablet Take 25 mg by mouth 2 (two) times daily with a meal.    [provider]  cyclobenzaprine (FLEXERIL) 10 MG tablet Take 10 mg by mouth 3 (three) times daily as needed for muscle spasms.     [provider]  digoxin (LANOXIN) 0.125 MG tablet Take 1 tablet by mouth every 3rd day 11/10/17   Deboraha Sprang, MD  diphenhydrAMINE (BENADRYL) 25 MG tablet Take 25 mg by mouth at bedtime as needed for sleep.    [provider]  doxycycline (VIBRAMYCIN) 100 MG capsule Take 1 capsule (100 mg total) by mouth 2 (two) times daily. 11/02/16   Orbie Pyo, MD  furosemide (LASIX) 40 MG tablet TAKE 1 TABLET BY MOUTH TWICE DAILY 10/08/17   Baldwin Jamaica, PA-C  gabapentin (NEURONTIN) 300 MG capsule Take 300 mg by mouth 2 (two) times daily.    [provider]  guaiFENesin-codeine (ROBITUSSIN AC) 100-10 MG/5ML syrup Take 5 mLs by mouth 3 (three) times daily as needed for cough.    [provider]  hydrOXYzine (VISTARIL) 25 MG capsule Take 25 mg by mouth at bedtime as needed for anxiety (sleep).    [provider]  ipratropium-albuterol (DUONEB) 0.5-2.5 (3) MG/3ML SOLN Take 3 mLs by nebulization every 4 (four) hours as needed (for wheezing).     [provider]  levothyroxine (SYNTHROID, LEVOTHROID) 112 MCG tablet Take 112 mcg by mouth daily.    [provider]  losartan (COZAAR) 50 MG tablet Take 25 mg by mouth daily. 12/17/10   Deboraha Sprang, MD  Melatonin 10 MG TABS Take 10 mg by mouth  at bedtime as needed (sleep).    [provider]  Menthol, Topical Analgesic, (BENGAY EX) Apply 1 application topically daily as needed (back pain).    [provider]  omeprazole (PRILOSEC) 20 MG capsule Take 20 mg by mouth every morning.     [provider]  oxyCODONE (OXY IR/ROXICODONE) 5 MG immediate release tablet Take 5 mg by mouth at bedtime as needed for severe pain.     [provider]  Sennosides (EX-LAX PO) Take 2 tablets by mouth at bedtime as needed (constipation).    [provider]  Terbinafine HCl (LAMISIL SPRAY EX) Apply 1 spray topically daily as needed (foot itching).    [provider]  Tetrahydrozoline HCl (VISINE OP) Apply 1 drop to eye daily as needed (dry eyes).    [provider]  tiotropium (SPIRIVA) 18 MCG inhalation capsule Place 18 mcg into inhaler and inhale daily as needed (shortness of breath).     [provider]  traMADol (ULTRAM) 50 MG tablet Take 50 mg by mouth at bedtime as needed for moderate pain.     [provider]  venlafaxine (EFFEXOR) 75 MG tablet Take 75 mg by mouth 2 (two) times daily.    [provider]  vitamin B-12 (CYANOCOBALAMIN) 1000 MCG tablet Take 1,000 mcg by mouth 2 (two) times daily.     [provider]    Allergies Codeine and Contrast media [iodinated diagnostic agents]  Family History  Problem Relation Age of Onset  . Hypertension Mother   . Breast cancer Mother   . Stroke Mother   . Heart disease Father   . Heart attack Sister   . Hypertension Brother   . Heart attack Sister   . Colon cancer Brother 37    Social History Social History   Tobacco Use  . Smoking status: Former Smoker    Packs/day: 3.00    Years: 20.00    Pack years: 60.00    Types: Cigarettes    Last attempt to quit: 02/04/2002    Years since quitting: 16.1  . Smokeless tobacco: Never Used  Substance Use Topics  . Alcohol use: No  . Drug use: No     Review of Systems Constitutional: No fever/chills Eyes: No visual changes. ENT: No sore throat. No stiff neck no neck pain Cardiovascular: Denies chest pain. Respiratory: Denies shortness of breath. Gastrointestinal:   no vomiting.  No diarrhea.  No constipation. Genitourinary: Negative for dysuria. Musculoskeletal: Negative lower extremity swelling Skin: Negative for rash. Neurological: Negative for severe headaches, focal weakness or numbness.   ____________________________________________   PHYSICAL EXAM:  VITAL SIGNS: ED Triage Vitals  Enc Vitals Group     BP 04/03/18 0829 115/64     Pulse Rate 04/03/18 0829 99     Resp 04/03/18 0829 20     Temp 04/03/18 0829 98.3 F (36.8 C)     Temp Source 04/03/18 0829 Oral     SpO2 04/03/18 0829 100 %     Weight 04/03/18 0830 170 lb (77.1 kg)     Height 04/03/18 0830 5\' 3"  (1.6 m)     Head Circumference --      Peak Flow --      Pain Score 04/03/18 0829 8     Pain Loc --      Pain Edu? --      Excl. in [place]? --     Constitutional: Alert and oriented. Well appearing and in no acute distress. Eyes: Conjunctivae are normal Head: Atraumatic HEENT: No congestion/rhinnorhea. Mucous membranes are moist.  Oropharynx non-erythematous Neck:   Nontender with no meningismus, no masses, no stridor Cardiovascular: Normal rate, regular rhythm. Grossly normal heart sounds.  Good peripheral circulation. Respiratory: Normal respiratory effort.  No retractions. Lungs CTAB. Abdominal: Soft and nontender. No distention. No guarding no rebound Back:  There is no focal tenderness or step off.  there is no midline tenderness there are no lesions noted. there is no CVA tenderness Musculoskeletal: There is pain and swelling to the right knee but she can range it to some extent, there is also pain to palpation to the right hip which I can also range.  No other obvious injury Neurologic:  Normal speech and language. No gross focal neurologic deficits  are appreciated.  Skin:  Skin  is warm, dry and intact. No rash noted. Psychiatric: Mood and affect are normal. Speech and behavior are normal.  ____________________________________________   LABS (all labs ordered are listed, but only abnormal results are displayed)  Labs Reviewed - No data to display  Pertinent labs  results that were available during my care of the patient were reviewed by me and considered in my medical decision making (see chart for details). ____________________________________________  EKG  I personally interpreted any EKGs ordered by me or triage  ____________________________________________  RADIOLOGY  Pertinent labs & imaging results that were available during my care of the patient were reviewed by me and considered in my medical decision making (see chart for details). If possible, patient and/or family made aware of any abnormal findings.  No results found. ____________________________________________    PROCEDURES  Procedure(s) performed: None  Procedures  Critical Care performed: None  ____________________________________________   INITIAL IMPRESSION / ASSESSMENT AND PLAN / ED COURSE  Pertinent labs & imaging results that were available during my care of the patient were reviewed by me and considered in my medical decision making (see chart for details).  Patient here after which she describes as a non-syncopal fall with ongoing hip and knee pain.  She has had limited ability to ambulate since the fall.  She has some obvious swelling to her knee will obtain x-rays to start with I have offered her pain medication but she states she would like some food first which we will offer to her.  ----------------------------------------- 10:59 AM on 04/03/2018 -----------------------------------------  Able to ambulate without difficulty with a walker, she would prefer not to have a knee immobilizer as it will impair her balance I think that  another reasonable will wrap the knee with an Ace wrap and have her follow-up with orthopedic surgery.  I will send her home with pain medication.  ----------------------------------------- 12:29 PM on 04/03/2018 -----------------------------------------  Family are here, they do not feel she needs a CT scan of her head she is at her baseline and I agree, she did not her head she felt several days ago I do not think there is high likelihood of any neurosurgical pathology today.  Patient was seen at my request by home health, they and I are arranging outpatient assistance for this patient including PT, OT, we have ordered a walker and a bedside commode which I think will help her as she has trouble walking at baseline and is now injured her knee.  I stressed the need for outpatient orthopedic follow-up, patient family very comfortable this plan we will discharge.  No other injury noted, low suspicion for hip fracture to start with and x-rays are negative and I have looked at the myself I do not see any evidence of fracture.  Return precautions follow-up given understood.    ____________________________________________   FINAL CLINICAL IMPRESSION(S) / ED DIAGNOSES  Final diagnoses:  None      This chart was dictated using voice recognition software.  Despite best efforts to proofread,  errors can occur which can change meaning.      Schuyler Amor, MD 04/03/18 [id number]    Schuyler Amor, MD 04/03/18 1059    Schuyler Amor, MD 04/03/18 1230

## 2018-04-03 NOTE — ED Notes (Signed)
Pt able to ambulate with assisted with RN Annie Main

## 2018-04-03 NOTE — ED Notes (Signed)
PT seen by Social Work, home health arranged for pt, awaiting bedside commode before discharge.

## 2018-04-03 NOTE — ED Triage Notes (Signed)
PT arrives from home with concerns over increased swelling and inability to put wt on right leg due to knee pain. Per ems report pt had mechanical fall Wednesday. Pt is a poor historian at baseline and lives at home with her husband. Pt wears 3L chronically.

## 2018-04-05 DIAGNOSIS — Z8701 Personal history of pneumonia (recurrent): Secondary | ICD-10-CM | POA: Diagnosis not present

## 2018-04-05 DIAGNOSIS — F419 Anxiety disorder, unspecified: Secondary | ICD-10-CM | POA: Diagnosis not present

## 2018-04-05 DIAGNOSIS — Z9181 History of falling: Secondary | ICD-10-CM | POA: Diagnosis not present

## 2018-04-05 DIAGNOSIS — Z7982 Long term (current) use of aspirin: Secondary | ICD-10-CM | POA: Diagnosis not present

## 2018-04-05 DIAGNOSIS — I428 Other cardiomyopathies: Secondary | ICD-10-CM | POA: Diagnosis not present

## 2018-04-05 DIAGNOSIS — J449 Chronic obstructive pulmonary disease, unspecified: Secondary | ICD-10-CM | POA: Diagnosis not present

## 2018-04-05 DIAGNOSIS — K219 Gastro-esophageal reflux disease without esophagitis: Secondary | ICD-10-CM | POA: Diagnosis not present

## 2018-04-05 DIAGNOSIS — Z95810 Presence of automatic (implantable) cardiac defibrillator: Secondary | ICD-10-CM | POA: Diagnosis not present

## 2018-04-05 DIAGNOSIS — Z86010 Personal history of colonic polyps: Secondary | ICD-10-CM | POA: Diagnosis not present

## 2018-04-05 DIAGNOSIS — H9192 Unspecified hearing loss, left ear: Secondary | ICD-10-CM | POA: Diagnosis not present

## 2018-04-05 DIAGNOSIS — M1990 Unspecified osteoarthritis, unspecified site: Secondary | ICD-10-CM | POA: Diagnosis not present

## 2018-04-05 DIAGNOSIS — S8001XD Contusion of right knee, subsequent encounter: Secondary | ICD-10-CM | POA: Diagnosis not present

## 2018-04-05 DIAGNOSIS — F329 Major depressive disorder, single episode, unspecified: Secondary | ICD-10-CM | POA: Diagnosis not present

## 2018-04-05 DIAGNOSIS — F0390 Unspecified dementia without behavioral disturbance: Secondary | ICD-10-CM | POA: Diagnosis not present

## 2018-04-05 DIAGNOSIS — Z87891 Personal history of nicotine dependence: Secondary | ICD-10-CM | POA: Diagnosis not present

## 2018-04-05 DIAGNOSIS — G4733 Obstructive sleep apnea (adult) (pediatric): Secondary | ICD-10-CM | POA: Diagnosis not present

## 2018-04-05 DIAGNOSIS — I5042 Chronic combined systolic (congestive) and diastolic (congestive) heart failure: Secondary | ICD-10-CM | POA: Diagnosis not present

## 2018-04-05 DIAGNOSIS — I110 Hypertensive heart disease with heart failure: Secondary | ICD-10-CM | POA: Diagnosis not present

## 2018-04-05 DIAGNOSIS — R1310 Dysphagia, unspecified: Secondary | ICD-10-CM | POA: Diagnosis not present

## 2018-04-05 DIAGNOSIS — I714 Abdominal aortic aneurysm, without rupture: Secondary | ICD-10-CM | POA: Diagnosis not present

## 2018-04-05 DIAGNOSIS — S62634D Displaced fracture of distal phalanx of right ring finger, subsequent encounter for fracture with routine healing: Secondary | ICD-10-CM | POA: Diagnosis not present

## 2018-04-05 DIAGNOSIS — K5730 Diverticulosis of large intestine without perforation or abscess without bleeding: Secondary | ICD-10-CM | POA: Diagnosis not present

## 2018-04-05 DIAGNOSIS — K5900 Constipation, unspecified: Secondary | ICD-10-CM | POA: Diagnosis not present

## 2018-04-05 DIAGNOSIS — E039 Hypothyroidism, unspecified: Secondary | ICD-10-CM | POA: Diagnosis not present

## 2018-04-05 DIAGNOSIS — R32 Unspecified urinary incontinence: Secondary | ICD-10-CM | POA: Diagnosis not present

## 2018-04-07 DIAGNOSIS — E1165 Type 2 diabetes mellitus with hyperglycemia: Secondary | ICD-10-CM | POA: Diagnosis not present

## 2018-04-07 DIAGNOSIS — J9610 Chronic respiratory failure, unspecified whether with hypoxia or hypercapnia: Secondary | ICD-10-CM | POA: Diagnosis not present

## 2018-04-07 DIAGNOSIS — M545 Low back pain: Secondary | ICD-10-CM | POA: Diagnosis not present

## 2018-04-07 DIAGNOSIS — R1110 Vomiting, unspecified: Secondary | ICD-10-CM | POA: Diagnosis not present

## 2018-04-07 DIAGNOSIS — J449 Chronic obstructive pulmonary disease, unspecified: Secondary | ICD-10-CM | POA: Diagnosis not present

## 2018-04-07 DIAGNOSIS — Z79899 Other long term (current) drug therapy: Secondary | ICD-10-CM | POA: Diagnosis not present

## 2018-04-09 ENCOUNTER — Emergency Department: Payer: Medicare Other

## 2018-04-09 ENCOUNTER — Ambulatory Visit (INDEPENDENT_AMBULATORY_CARE_PROVIDER_SITE_OTHER): Payer: Medicare Other | Admitting: *Deleted

## 2018-04-09 ENCOUNTER — Inpatient Hospital Stay
Admission: EM | Admit: 2018-04-09 | Discharge: 2018-04-12 | DRG: 641 | Disposition: A | Discharge status: Home Health | Payer: Medicare Other | Attending: Internal Medicine | Admitting: Internal Medicine

## 2018-04-09 DIAGNOSIS — D638 Anemia in other chronic diseases classified elsewhere: Secondary | ICD-10-CM | POA: Diagnosis present

## 2018-04-09 DIAGNOSIS — Y92009 Unspecified place in unspecified non-institutional (private) residence as the place of occurrence of the external cause: Secondary | ICD-10-CM

## 2018-04-09 DIAGNOSIS — W19XXXA Unspecified fall, initial encounter: Secondary | ICD-10-CM | POA: Diagnosis present

## 2018-04-09 DIAGNOSIS — Z800 Family history of malignant neoplasm of digestive organs: Secondary | ICD-10-CM

## 2018-04-09 DIAGNOSIS — Z7982 Long term (current) use of aspirin: Secondary | ICD-10-CM

## 2018-04-09 DIAGNOSIS — R413 Other amnesia: Secondary | ICD-10-CM | POA: Diagnosis present

## 2018-04-09 DIAGNOSIS — Z9842 Cataract extraction status, left eye: Secondary | ICD-10-CM

## 2018-04-09 DIAGNOSIS — I428 Other cardiomyopathies: Secondary | ICD-10-CM

## 2018-04-09 DIAGNOSIS — J449 Chronic obstructive pulmonary disease, unspecified: Secondary | ICD-10-CM | POA: Diagnosis present

## 2018-04-09 DIAGNOSIS — I110 Hypertensive heart disease with heart failure: Secondary | ICD-10-CM | POA: Diagnosis present

## 2018-04-09 DIAGNOSIS — S299XXA Unspecified injury of thorax, initial encounter: Secondary | ICD-10-CM | POA: Diagnosis not present

## 2018-04-09 DIAGNOSIS — N179 Acute kidney failure, unspecified: Secondary | ICD-10-CM | POA: Diagnosis not present

## 2018-04-09 DIAGNOSIS — I5042 Chronic combined systolic (congestive) and diastolic (congestive) heart failure: Secondary | ICD-10-CM | POA: Diagnosis not present

## 2018-04-09 DIAGNOSIS — Z885 Allergy status to narcotic agent status: Secondary | ICD-10-CM

## 2018-04-09 DIAGNOSIS — T502X5A Adverse effect of carbonic-anhydrase inhibitors, benzothiadiazides and other diuretics, initial encounter: Secondary | ICD-10-CM | POA: Diagnosis present

## 2018-04-09 DIAGNOSIS — I714 Abdominal aortic aneurysm, without rupture: Secondary | ICD-10-CM | POA: Diagnosis present

## 2018-04-09 DIAGNOSIS — I420 Dilated cardiomyopathy: Secondary | ICD-10-CM | POA: Diagnosis not present

## 2018-04-09 DIAGNOSIS — E118 Type 2 diabetes mellitus with unspecified complications: Secondary | ICD-10-CM | POA: Diagnosis present

## 2018-04-09 DIAGNOSIS — S62634A Displaced fracture of distal phalanx of right ring finger, initial encounter for closed fracture: Secondary | ICD-10-CM | POA: Diagnosis not present

## 2018-04-09 DIAGNOSIS — R42 Dizziness and giddiness: Secondary | ICD-10-CM | POA: Diagnosis not present

## 2018-04-09 DIAGNOSIS — E669 Obesity, unspecified: Secondary | ICD-10-CM | POA: Diagnosis present

## 2018-04-09 DIAGNOSIS — E860 Dehydration: Principal | ICD-10-CM | POA: Diagnosis present

## 2018-04-09 DIAGNOSIS — R110 Nausea: Secondary | ICD-10-CM | POA: Diagnosis not present

## 2018-04-09 DIAGNOSIS — S0990XA Unspecified injury of head, initial encounter: Secondary | ICD-10-CM | POA: Diagnosis not present

## 2018-04-09 DIAGNOSIS — S62664A Nondisplaced fracture of distal phalanx of right ring finger, initial encounter for closed fracture: Secondary | ICD-10-CM

## 2018-04-09 DIAGNOSIS — Z91041 Radiographic dye allergy status: Secondary | ICD-10-CM

## 2018-04-09 DIAGNOSIS — Z79890 Hormone replacement therapy (postmenopausal): Secondary | ICD-10-CM

## 2018-04-09 DIAGNOSIS — Z6831 Body mass index (BMI) 31.0-31.9, adult: Secondary | ICD-10-CM

## 2018-04-09 DIAGNOSIS — I10 Essential (primary) hypertension: Secondary | ICD-10-CM | POA: Diagnosis not present

## 2018-04-09 DIAGNOSIS — I951 Orthostatic hypotension: Secondary | ICD-10-CM | POA: Diagnosis present

## 2018-04-09 DIAGNOSIS — Z7952 Long term (current) use of systemic steroids: Secondary | ICD-10-CM

## 2018-04-09 DIAGNOSIS — R531 Weakness: Secondary | ICD-10-CM | POA: Diagnosis not present

## 2018-04-09 DIAGNOSIS — R55 Syncope and collapse: Secondary | ICD-10-CM

## 2018-04-09 DIAGNOSIS — Z9981 Dependence on supplemental oxygen: Secondary | ICD-10-CM

## 2018-04-09 DIAGNOSIS — E039 Hypothyroidism, unspecified: Secondary | ICD-10-CM | POA: Diagnosis present

## 2018-04-09 DIAGNOSIS — M79646 Pain in unspecified finger(s): Secondary | ICD-10-CM | POA: Diagnosis not present

## 2018-04-09 DIAGNOSIS — Z79899 Other long term (current) drug therapy: Secondary | ICD-10-CM

## 2018-04-09 DIAGNOSIS — F419 Anxiety disorder, unspecified: Secondary | ICD-10-CM | POA: Diagnosis present

## 2018-04-09 DIAGNOSIS — R52 Pain, unspecified: Secondary | ICD-10-CM | POA: Diagnosis not present

## 2018-04-09 DIAGNOSIS — G4733 Obstructive sleep apnea (adult) (pediatric): Secondary | ICD-10-CM | POA: Diagnosis present

## 2018-04-09 DIAGNOSIS — R296 Repeated falls: Secondary | ICD-10-CM | POA: Diagnosis present

## 2018-04-09 DIAGNOSIS — E876 Hypokalemia: Secondary | ICD-10-CM | POA: Diagnosis present

## 2018-04-09 DIAGNOSIS — G8929 Other chronic pain: Secondary | ICD-10-CM | POA: Diagnosis present

## 2018-04-09 DIAGNOSIS — Z87891 Personal history of nicotine dependence: Secondary | ICD-10-CM

## 2018-04-09 DIAGNOSIS — F0390 Unspecified dementia without behavioral disturbance: Secondary | ICD-10-CM | POA: Diagnosis present

## 2018-04-09 DIAGNOSIS — Z95810 Presence of automatic (implantable) cardiac defibrillator: Secondary | ICD-10-CM

## 2018-04-09 DIAGNOSIS — Z7984 Long term (current) use of oral hypoglycemic drugs: Secondary | ICD-10-CM

## 2018-04-09 DIAGNOSIS — Z803 Family history of malignant neoplasm of breast: Secondary | ICD-10-CM

## 2018-04-09 DIAGNOSIS — Z8249 Family history of ischemic heart disease and other diseases of the circulatory system: Secondary | ICD-10-CM

## 2018-04-09 DIAGNOSIS — Z90710 Acquired absence of both cervix and uterus: Secondary | ICD-10-CM

## 2018-04-09 DIAGNOSIS — Z9841 Cataract extraction status, right eye: Secondary | ICD-10-CM

## 2018-04-09 DIAGNOSIS — K219 Gastro-esophageal reflux disease without esophagitis: Secondary | ICD-10-CM | POA: Diagnosis present

## 2018-04-09 DIAGNOSIS — Z823 Family history of stroke: Secondary | ICD-10-CM

## 2018-04-09 DIAGNOSIS — F329 Major depressive disorder, single episode, unspecified: Secondary | ICD-10-CM | POA: Diagnosis present

## 2018-04-09 DIAGNOSIS — Z961 Presence of intraocular lens: Secondary | ICD-10-CM | POA: Diagnosis present

## 2018-04-09 DIAGNOSIS — R000 Tachycardia, unspecified: Secondary | ICD-10-CM | POA: Diagnosis present

## 2018-04-09 DIAGNOSIS — J9611 Chronic respiratory failure with hypoxia: Secondary | ICD-10-CM | POA: Diagnosis present

## 2018-04-09 DIAGNOSIS — R4182 Altered mental status, unspecified: Secondary | ICD-10-CM | POA: Diagnosis present

## 2018-04-09 LAB — CBC WITH DIFFERENTIAL/PLATELET
Abs Immature Granulocytes: 0.04 10*3/uL (ref 0.00–0.07)
Basophils Absolute: 0.1 10*3/uL (ref 0.0–0.1)
Basophils Relative: 1 %
EOS PCT: 2 %
Eosinophils Absolute: 0.2 10*3/uL (ref 0.0–0.5)
HEMATOCRIT: 33.2 % — AB (ref 36.0–46.0)
Hemoglobin: 10.6 g/dL — ABNORMAL LOW (ref 12.0–15.0)
Immature Granulocytes: 0 %
Lymphocytes Relative: 18 %
Lymphs Abs: 1.8 10*3/uL (ref 0.7–4.0)
MCH: 28 pg (ref 26.0–34.0)
MCHC: 31.9 g/dL (ref 30.0–36.0)
MCV: 87.8 fL (ref 80.0–100.0)
MONO ABS: 0.8 10*3/uL (ref 0.1–1.0)
Monocytes Relative: 8 %
Neutro Abs: 7.3 10*3/uL (ref 1.7–7.7)
Neutrophils Relative %: 71 %
Platelets: 246 10*3/uL (ref 150–400)
RBC: 3.78 MIL/uL — ABNORMAL LOW (ref 3.87–5.11)
RDW: 15.7 % — ABNORMAL HIGH (ref 11.5–15.5)
WBC: 10.2 10*3/uL (ref 4.0–10.5)
nRBC: 0 % (ref 0.0–0.2)

## 2018-04-09 LAB — CUP PACEART REMOTE DEVICE CHECK
Battery Remaining Longevity: 64 mo
Battery Remaining Percentage: 76 %
Battery Voltage: 2.98 V
Brady Statistic AP VP Percent: 1 %
Brady Statistic AP VS Percent: 1 %
Brady Statistic AS VP Percent: 97 %
Brady Statistic AS VS Percent: 3.1 %
Brady Statistic RA Percent Paced: 1 %
HighPow Impedance: 59 Ohm
HighPow Impedance: 60 Ohm
Implantable Lead Implant Date: 20110309
Implantable Lead Implant Date: 20110309
Implantable Lead Implant Date: 20110309
Implantable Lead Location: 753858
Implantable Lead Location: 753859
Implantable Lead Location: 753860
Implantable Lead Model: 7121
Implantable Pulse Generator Implant Date: 20180814
Lead Channel Impedance Value: 380 Ohm
Lead Channel Impedance Value: 640 Ohm
Lead Channel Impedance Value: 690 Ohm
Lead Channel Pacing Threshold Amplitude: 0.75 V
Lead Channel Pacing Threshold Amplitude: 0.75 V
Lead Channel Pacing Threshold Amplitude: 1 V
Lead Channel Pacing Threshold Pulse Width: 0.5 ms
Lead Channel Pacing Threshold Pulse Width: 0.5 ms
Lead Channel Pacing Threshold Pulse Width: 0.5 ms
Lead Channel Sensing Intrinsic Amplitude: 11.7 mV
Lead Channel Sensing Intrinsic Amplitude: 5 mV
Lead Channel Setting Pacing Amplitude: 2 V
Lead Channel Setting Pacing Amplitude: 2.25 V
Lead Channel Setting Pacing Amplitude: 2.5 V
Lead Channel Setting Pacing Pulse Width: 0.5 ms
Lead Channel Setting Pacing Pulse Width: 0.5 ms
Lead Channel Setting Sensing Sensitivity: 0.5 mV
MDC IDC SESS DTM: 20200305070015
Pulse Gen Serial Number: 9761609

## 2018-04-09 LAB — COMPREHENSIVE METABOLIC PANEL
ALT: 12 U/L (ref 0–44)
AST: 17 U/L (ref 15–41)
Albumin: 3.4 g/dL — ABNORMAL LOW (ref 3.5–5.0)
Alkaline Phosphatase: 109 U/L (ref 38–126)
Anion gap: 12 (ref 5–15)
BUN: 27 mg/dL — ABNORMAL HIGH (ref 8–23)
CO2: 32 mmol/L (ref 22–32)
Calcium: 8.7 mg/dL — ABNORMAL LOW (ref 8.9–10.3)
Chloride: 90 mmol/L — ABNORMAL LOW (ref 98–111)
Creatinine, Ser: 1.44 mg/dL — ABNORMAL HIGH (ref 0.44–1.00)
GFR calc Af Amer: 43 mL/min — ABNORMAL LOW (ref >60)
GFR calc non Af Amer: 37 mL/min — ABNORMAL LOW (ref >60)
Glucose, Bld: 139 mg/dL — ABNORMAL HIGH (ref 70–99)
Potassium: 3.4 mmol/L — ABNORMAL LOW (ref 3.5–5.1)
Sodium: 134 mmol/L — ABNORMAL LOW (ref 135–145)
Total Bilirubin: 0.4 mg/dL (ref 0.3–1.2)
Total Protein: 7.5 g/dL (ref 6.5–8.1)

## 2018-04-09 LAB — URINALYSIS, COMPLETE (UACMP) WITH MICROSCOPIC
Bacteria, UA: NONE SEEN
Bilirubin Urine: NEGATIVE
Glucose, UA: NEGATIVE mg/dL
Hgb urine dipstick: NEGATIVE
Ketones, ur: NEGATIVE mg/dL
Leukocytes,Ua: NEGATIVE
Nitrite: NEGATIVE
Protein, ur: NEGATIVE mg/dL
Specific Gravity, Urine: 1.005 (ref 1.005–1.030)
WBC, UA: NONE SEEN WBC/hpf (ref 0–5)
pH: 6 (ref 5.0–8.0)

## 2018-04-09 LAB — DIGOXIN LEVEL: Digoxin Level: 0.9 ng/mL (ref 0.8–2.0)

## 2018-04-09 LAB — TROPONIN I: Troponin I: <0.03 ng/mL (ref <0.03)

## 2018-04-09 LAB — INFLUENZA PANEL BY PCR (TYPE A & B)
Influenza A By PCR: NEGATIVE
Influenza B By PCR: NEGATIVE

## 2018-04-09 LAB — LACTIC ACID, PLASMA: Lactic Acid, Venous: 1.4 mmol/L (ref 0.5–1.9)

## 2018-04-09 MED ORDER — SODIUM CHLORIDE 0.9 % IV BOLUS
500.0000 mL | Freq: Once | INTRAVENOUS | Status: AC
  Administered 2018-04-09: 500 mL via INTRAVENOUS

## 2018-04-09 NOTE — H&P (Signed)
[name] NAME: Stacy Grimes    MR#:  [id number]  DATE OF BIRTH:  03/17/1950  DATE OF ADMISSION:  04/09/2018  PRIMARY CARE PHYSICIAN: Cyndi Bender, PA-C   REQUESTING/REFERRING PHYSICIAN: Burlene Arnt, MD  CHIEF COMPLAINT:   Chief Complaint  Patient presents with  . Hypotension  . Fall    HISTORY OF PRESENT ILLNESS:  Stacy Grimes  is a 68 y.o. female who presents with chief complaint as above.  Patient presents the ED after 2 episodes of near syncope or syncope today.  She has a significant cardiac history.  She describes events only in the sense that she was feeling lightheaded.  She denies any chest pain, palpitations, or other symptoms.  When she arrived to the ED her blood pressure was initially low, with systolic in the 36U.  She received some IV fluids and this corrected well.  Hospitalist were called for admission  PAST MEDICAL HISTORY:   Past Medical History:  Diagnosis Date  . AAA (abdominal aortic aneurysm) ([place])   . Anginal pain ([place])   . Anxiety   . Arthritis   . Automatic implantable cardioverter-defibrillator in situ   . Biventricular ICD  [employer]   . CHF (congestive heart failure) ([place])   . Chronic systolic heart failure ([place])   . COPD (chronic obstructive pulmonary disease) ([place])   . Deafness    left ear  . Depression   . Dizziness   . Dysrhythmia   . Fracture    history of spinal fracture  . GERD (gastroesophageal reflux disease)   . Hypertension   . Hypothyroidism   . Non-ischemic cardiomyopathy ([place])   . Oxygen dependent   . Palpitations   . Shortness of breath dyspnea   . Sleep apnea   . Wheezing      PAST SURGICAL HISTORY:   Past Surgical History:  Procedure Laterality Date  . ABDOMINAL HYSTERECTOMY    . BIV ICD GENERATOR CHANGEOUT N/A 09/17/2016   Procedure: BiV ICD Generator Changeout;  Surgeon: Deboraha Sprang, MD;  Location: [facility];  Service: Cardiovascular;   Laterality: N/A;  . BLADDER SURGERY    . CARDIAC CATHETERIZATION    . CARDIAC DEFIBRILLATOR PLACEMENT  4 yrs ago   pacemaker  . CATARACT EXTRACTION W/PHACO Left 09/01/2014   Procedure: CATARACT EXTRACTION PHACO AND INTRAOCULAR LENS PLACEMENT (IOC);  Surgeon: Lyla Glassing, MD;  Location: [facility];  Service: Ophthalmology;  Laterality: Left;  [place]: 1:12.1   . CATARACT EXTRACTION W/PHACO Right 09/29/2014   Procedure: CATARACT EXTRACTION PHACO AND INTRAOCULAR LENS PLACEMENT (IOC);  Surgeon: Lyla Glassing, MD;  Location: [facility];  Service: Ophthalmology;  Laterality: Right;  [place]: 00:52.7   . CHOLECYSTECTOMY    . COLON SURGERY    . COLONOSCOPY    . COLONOSCOPY N/A 08/31/2012   Procedure: COLONOSCOPY;  Surgeon: Inda Castle, MD;  Location: [facility];  Service: Endoscopy;  Laterality: N/A;  . ESOPHAGOGASTRODUODENOSCOPY N/A 07/06/2012   Procedure: ESOPHAGOGASTRODUODENOSCOPY (EGD);  Surgeon: Lafayette Dragon, MD;  Location: [facility];  Service: Endoscopy;  Laterality: N/A;  . KNEE SURGERY Right   . MIDDLE EAR SURGERY    . SAVORY DILATION N/A 07/06/2012   Procedure: SAVORY DILATION;  Surgeon: Lafayette Dragon, MD;  Location: [facility];  Service: Endoscopy;  Laterality: N/A;  . WRIST SURGERY Right      SOCIAL HISTORY:   Social History   Tobacco Use  . Smoking status:  Former Smoker    Packs/day: 3.00    Years: 20.00    Pack years: 60.00    Types: Cigarettes    Last attempt to quit: 02/04/2002    Years since quitting: 16.1  . Smokeless tobacco: Never Used  Substance Use Topics  . Alcohol use: No     FAMILY HISTORY:   Family History  Problem Relation Age of Onset  . Hypertension Mother   . Breast cancer Mother   . Stroke Mother   . Heart disease Father   . Heart attack Sister   . Hypertension Brother   . Heart attack Sister   . Colon cancer Brother 54     DRUG ALLERGIES:   Allergies  Allergen Reactions  . Codeine Palpitations  . Contrast Media [Iod[employer]] Rash    MEDICATIONS AT HOME:   Prior to Admission medications   Medication Sig Start Date End Date Taking? Authorizing Provider  albuterol (PROVENTIL HFA;VENTOLIN HFA) 108 (90 Base) MCG/ACT inhaler Inhale 2 puffs into the lungs every 6 (six) hours as needed. 11/02/16   Schaevitz, Randall An, MD  allopurinol (ZYLOPRIM) 300 MG tablet Take 300 mg by mouth 2 (two) times daily.     [provider]  aspirin 81 MG EC tablet Take 81 mg by mouth daily.      [provider]  carvedilol (COREG) 25 MG tablet Take 25 mg by mouth 2 (two) times daily with a meal.    [provider]  cyclobenzaprine (FLEXERIL) 10 MG tablet Take 10 mg by mouth 3 (three) times daily as needed for muscle spasms.     [provider]  digoxin (LANOXIN) 0.125 MG tablet Take 1 tablet by mouth every 3rd day 11/10/17   Deboraha Sprang, MD  diphenhydrAMINE (BENADRYL) 25 MG tablet Take 25 mg by mouth at bedtime as needed for sleep.    [provider]  doxycycline (VIBRAMYCIN) 100 MG capsule Take 1 capsule (100 mg total) by mouth 2 (two) times daily. 11/02/16   Orbie Pyo, MD  furosemide (LASIX) 40 MG tablet TAKE 1 TABLET BY MOUTH TWICE DAILY Patient not taking: Reported on 04/03/2018 10/08/17   Baldwin Jamaica, PA-C  gabapentin (NEURONTIN) 300 MG capsule Take 300 mg by mouth 2 (two) times daily.    [provider]  guaiFENesin-codeine (ROBITUSSIN AC) 100-10 MG/5ML syrup Take 5 mLs by mouth 3 (three) times daily as needed for cough.    [provider]  hydrOXYzine (VISTARIL) 25 MG capsule Take 25 mg by mouth at bedtime as needed for anxiety (sleep).    [provider]  indomethacin (INDOCIN) 50 MG capsule Take 50 mg by mouth 3 (three) times daily. 12/09/17   [provider]  ipratropium-albuterol (DUONEB) 0.5-2.5 (3) MG/3ML SOLN Take 3 mLs by nebulization every 4 (four) hours as needed (for wheezing).     [provider]   levothyroxine (SYNTHROID, LEVOTHROID) 112 MCG tablet Take 112 mcg by mouth daily.    [provider]  losartan (COZAAR) 50 MG tablet Take 25 mg by mouth daily. 12/17/10   Deboraha Sprang, MD  Melatonin 10 MG TABS Take 10 mg by mouth at bedtime as needed (sleep).    [provider]  Menthol, Topical Analgesic, (BENGAY EX) Apply 1 application topically daily as needed (back pain).    [provider]  metFORMIN (GLUCOPHAGE-XR) 500 MG 24 hr tablet Take 500 mg by mouth 2 (two) times daily. 03/05/18   [provider]  Omega-3 1000 MG CAPS Take 1 capsule by mouth 2 (two) times daily.    [provider]  omeprazole (PRILOSEC) 20 MG capsule Take 20 mg by mouth every morning.     [provider]  predniSONE (DELTASONE) 20 MG tablet Take 20 mg by mouth daily. 03/24/18   [provider]  Sennosides (EX-LAX PO) Take 2 tablets by mouth at bedtime as needed (constipation).    [provider]  Terbinafine HCl (LAMISIL SPRAY EX) Apply 1 spray topically daily as needed (foot itching).    [provider]  Tetrahydrozoline HCl (VISINE OP) Apply 1 drop to eye daily as needed (dry eyes).    [provider]  tiotropium (SPIRIVA) 18 MCG inhalation capsule Place 18 mcg into inhaler and inhale daily as needed (shortness of breath).     [provider]  traMADol (ULTRAM) 50 MG tablet Take 50 mg by mouth at bedtime as needed for moderate pain.     [provider]  venlafaxine (EFFEXOR) 75 MG tablet Take 75 mg by mouth 2 (two) times daily.    [provider]  vitamin B-12 (CYANOCOBALAMIN) 1000 MCG tablet Take 1,000 mcg by mouth 2 (two) times daily.     [provider]    REVIEW OF SYSTEMS:  Review of Systems  Constitutional: Negative for chills, fever, malaise/fatigue and weight loss.  HENT: Negative for ear pain, hearing loss and tinnitus.   Eyes: Negative for blurred vision, double vision, pain and  redness.  Respiratory: Negative for cough, hemoptysis and shortness of breath.   Cardiovascular: Negative for chest pain, palpitations, orthopnea and leg swelling.  Gastrointestinal: Negative for abdominal pain, constipation, diarrhea, nausea and vomiting.  Genitourinary: Negative for dysuria, frequency and hematuria.  Musculoskeletal: Negative for back pain, joint pain and neck pain.  Skin:       No acne, rash, or lesions  Neurological: Positive for loss of consciousness. Negative for dizziness, tremors, focal weakness and weakness.  Endo/Heme/Allergies: Negative for polydipsia. Does not bruise/bleed easily.  Psychiatric/Behavioral: Negative for depression. The patient is not nervous/anxious and does not have insomnia.      VITAL SIGNS:   Vitals:   04/09/18 1953 04/09/18 2000 04/09/18 2030 04/09/18 2130  BP:  (!) 107/57 98/86 (!) 128/113  Pulse:   98 93  Resp:  15 (!) 22 19  Temp:      TempSrc:      SpO2:   93% 99%  Weight: 104.3 kg      Wt Readings from Last 3 Encounters:  04/09/18 104.3 kg  04/03/18 77.1 kg  07/16/17 90.7 kg    PHYSICAL EXAMINATION:  Physical Exam  Vitals reviewed. Constitutional: She is oriented to person, place, and time. She appears well-developed and well-nourished. No distress.  HENT:  Head: Normocephalic and atraumatic.  Mouth/Throat: Oropharynx is clear and moist.  Eyes: Pupils are equal, round, and reactive to light. Conjunctivae and EOM are normal. No scleral icterus.  Neck: Normal range of motion. Neck supple. No JVD present. No thyromegaly present.  Cardiovascular: Normal rate, regular rhythm and intact distal pulses. Exam reveals no gallop and no friction rub.  No murmur heard. Respiratory: Effort normal and breath sounds normal. No respiratory distress. She has no wheezes. She has no rales.  GI: Soft. Bowel sounds are normal. She exhibits no distension. There is no abdominal tenderness.  Musculoskeletal: Normal range of motion.         General: No edema.  Comments: No arthritis, no gout  Lymphadenopathy:    She has no cervical adenopathy.  Neurological: She is alert and oriented to person, place, and time. No cranial nerve deficit.  No dysarthria, no aphasia  Skin: Skin is warm and dry. No rash noted. No erythema.  Psychiatric: She has a normal mood and affect. Her behavior is normal. Judgment and thought content normal.    LABORATORY PANEL:   CBC Recent Labs  Lab 04/09/18 1959  WBC 10.2  HGB 10.6*  HCT 33.2*  PLT 246   ------------------------------------------------------------------------------------------------------------------  Chemistries  Recent Labs  Lab 04/09/18 1959  NA 134*  K 3.4*  CL 90*  CO2 32  GLUCOSE 139*  BUN 27*  CREATININE 1.44*  CALCIUM 8.7*  AST 17  ALT 12  ALKPHOS 109  BILITOT 0.4   ------------------------------------------------------------------------------------------------------------------  Cardiac Enzymes Recent Labs  Lab 04/09/18 1959  TROPONINI <0.03   ------------------------------------------------------------------------------------------------------------------  RADIOLOGY:  Ct Head Wo Contrast  Result Date: 04/09/2018 CLINICAL DATA:  Reported fall. Low blood pressure. EXAM: CT HEAD WITHOUT CONTRAST TECHNIQUE: Contiguous axial images were obtained from the base of the skull through the vertex without intravenous contrast. COMPARISON:  CT 02/20/2015 FINDINGS: Brain: Brain volume is normal for age. Mild chronic small vessel ischemia. No intracranial hemorrhage, mass effect, or midline shift. No hydrocephalus. The basilar cisterns are patent. No evidence of territorial infarct or acute ischemia. No extra-axial or intracranial fluid collection. Vascular: No hyperdense vessel. Skull: No fracture or focal lesion. Sinuses/Orbits: Bilateral lens extraction. Small fluid level in left side of sphenoid sinus, remaining paranasal sinuses are clear. The mastoid air cells  are well-aerated. Other: None. IMPRESSION: No acute intracranial abnormality. No skull fracture. Electronically Signed   By: Keith Rake M.D.   On: 04/09/2018 21:01   Dg Chest Port 1 View  Result Date: 04/09/2018 CLINICAL DATA:  Low blood pressure, possible fall. History of GERD, hypertension, COPD. EXAM: PORTABLE CHEST 1 VIEW COMPARISON:  Chest x-ray dated 11/02/2016. FINDINGS: LEFT chest wall pacemaker leads appear stable in position. Heart size and mediastinal contours are stable. Lungs are clear. No pleural effusion or pneumothorax seen. Osseous structures about the chest are unremarkable. IMPRESSION: No active disease. No evidence of pneumonia or pulmonary edema. Electronically Signed   By: Franki Cabot M.D.   On: 04/09/2018 20:36   Dg Hand Complete Right  Result Date: 04/09/2018 CLINICAL DATA:  Hand pain distal ring finger EXAM: RIGHT HAND - COMPLETE 3+ VIEW COMPARISON:  None. FINDINGS: Surgical plate and fixating screws extending from the distal radius to the third metacarpal, traversing the carpal bones. Acute displaced intra-articular fracture involving the volar base of the fourth distal phalanx. Fused appearance of the carpal bones. IMPRESSION: Acute mildly displaced intra-articular fracture involving the dorsal base of the fourth distal phalanx Electronically Signed   By: Donavan Foil M.D.   On: 04/09/2018 21:02    EKG:   Orders placed or performed during the hospital encounter of 04/09/18  . ED EKG  . ED EKG    IMPRESSION AND PLAN:  Principal Problem:   Syncope - unclear etiology, though patient has significant cardiac history.  Work-up in the ED is largely unrevealing as to a cause to her syncope.  However, when she first arrived her blood pressure was somewhat low.  This corrected quickly with some IV fluids.  Cardiology consult, echocardiogram ordered Active Problems:   HYPERTENSION, BENIGN -patient's blood pressure was low initially, and is now low and normotensive.  Hold  antihypertensives  for now   Dilated cardiomyopathy ([place]) -continue home meds   Chronic combined systolic and diastolic CHF (congestive heart failure) ([place]) -continue home medications  Chart review performed and case discussed with ED provider. Labs, imaging and/or ECG reviewed by provider and discussed with patient/family. Management plans discussed with the patient and/or family.  DVT PROPHYLAXIS: SubQ lovenox   GI PROPHYLAXIS:  None  ADMISSION STATUS: Observation  CODE STATUS: Full Code Status History    Date Active Date Inactive Code Status Order ID Comments User Context   09/17/2016 1152 09/17/2016 1631 Full Code [id number]  Deboraha Sprang, MD Inpatient   03/28/2012 2328 03/30/2012 1950 Full Code [id number]  Shanda Howells, MD Inpatient      TOTAL TIME TAKING CARE OF THIS PATIENT: 40 minutes.   Ethlyn Daniels 04/09/2018, 11:11 PM  [facility]  Office  [phone]  CC: Primary care physician; Cyndi Bender, PA-C  Note:  This document was prepared using Dragon voice recognition software and may include unintentional dictation errors.

## 2018-04-09 NOTE — ED Triage Notes (Signed)
Pt here via ems , with report of low b/p and possible fall , resulting in a finger injury to the right hand . Pt a/o x3 but has hx of short term memory loss .

## 2018-04-09 NOTE — ED Notes (Signed)
This EDT and [place] EDT, applied finger splint to the pt fourth finger at this time

## 2018-04-09 NOTE — ED Provider Notes (Addendum)
[facility] Emergency Department Provider Note  ____________________________________________   I have reviewed the triage vital signs and the nursing notes. Where available I have reviewed prior notes and, if possible and indicated, outside hospital notes.    HISTORY  Chief Complaint Hypotension and Fall    HPI Stacy Grimes is a 68 y.o. female who presents today complaining of passing out.  Patient has a history of [employer] valve, AICD CHF COPD on 4 L poor hearing poor memory, appears at her baseline now to family but today had passed out.  When she just fell back in the bed and the other time she fell near the door.  She is not sure if she may have taken extra medication.  She thinks she did not.  Patient states that she otherwise has been feeling "okay".  She does have a cough.  She has had no fever no chills no vomiting no nausea.  History is somewhat limited.  Most of its per the husband, patient's poor recollection of events at baseline and poor recollection of events today.  She did think she hurt her finger when she fell.  She has no other prodrome that she can recall.  Level 5 chart caveat; no further history available due to patient status.   Past Medical History:  Diagnosis Date  . AAA (abdominal aortic aneurysm) ([place])   . Anginal pain ([place])   . Anxiety   . Arthritis   . Automatic implantable cardioverter-defibrillator in situ   . Biventricular ICD  [employer]   . CHF (congestive heart failure) ([place])   . Chronic systolic heart failure ([place])   . COPD (chronic obstructive pulmonary disease) ([place])   . Deafness    left ear  . Depression   . Dizziness   . Dysrhythmia   . Fracture    history of spinal fracture  . GERD (gastroesophageal reflux disease)   . Hypertension   . Hypothyroidism   . Non-ischemic cardiomyopathy ([place])   . Oxygen dependent   . Palpitations   . Shortness of breath dyspnea   . Sleep apnea   . Wheezing     Patient  Active Problem List   Diagnosis Date Noted  . 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 Diverticulosis of colon (without mention of hemorrhage) 08/31/2012  . Fatigue 08/10/2012  . Personal history of colonic polyps 07/02/2012  . Dysphagia, unspecified(787.20) 07/02/2012  . Unspecified constipation 07/02/2012  . 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 HYPERTENSION, BENIGN 03/24/2009  . SLEEP APNEA, OBSTRUCTIVE 03/23/2009  . CARDIOMYOPATHY, PRIMARY, DILATED 03/23/2009  . SYSTOLIC HEART FAILURE, CHRONIC 03/23/2009  . PALPITATIONS 03/23/2009  . MURMUR 03/23/2009  . COUGH 03/23/2009    Past Surgical History:  Procedure Laterality Date  . ABDOMINAL HYSTERECTOMY    . BIV ICD GENERATOR CHANGEOUT N/A 09/17/2016   Procedure: BiV ICD Generator Changeout;  Surgeon: Deboraha Sprang, MD;  Location: [facility];  Service: Cardiovascular;  Laterality: N/A;  . BLADDER SURGERY    . CARDIAC CATHETERIZATION    . CARDIAC DEFIBRILLATOR PLACEMENT  4 yrs ago   pacemaker  . CATARACT EXTRACTION W/PHACO Left 09/01/2014   Procedure: CATARACT EXTRACTION PHACO AND INTRAOCULAR LENS PLACEMENT (IOC);  Surgeon: Lyla Glassing, MD;  Location: [facility];  Service: Ophthalmology;  Laterality: Left;  [place]: 1:12.1   . CATARACT EXTRACTION W/PHACO Right 09/29/2014   Procedure: CATARACT EXTRACTION PHACO AND INTRAOCULAR LENS PLACEMENT (IOC);  Surgeon: Lyla Glassing, MD;  Location: [facility];  Service: Ophthalmology;  Laterality: Right;  [place]: 00:52.7   . CHOLECYSTECTOMY    . COLON SURGERY    . COLONOSCOPY    . COLONOSCOPY N/A 08/31/2012   Procedure: COLONOSCOPY;  Surgeon: Inda Castle, MD;  Location: [facility];  Service: Endoscopy;  Laterality: N/A;  . ESOPHAGOGASTRODUODENOSCOPY N/A 07/06/2012   Procedure: ESOPHAGOGASTRODUODENOSCOPY (EGD);  Surgeon: Lafayette Dragon, MD;  Location: [facility];  Service: Endoscopy;  Laterality: N/A;  . KNEE SURGERY Right   . MIDDLE EAR SURGERY     . SAVORY DILATION N/A 07/06/2012   Procedure: SAVORY DILATION;  Surgeon: Lafayette Dragon, MD;  Location: [facility];  Service: Endoscopy;  Laterality: N/A;  . WRIST SURGERY Right     Prior to Admission medications   Medication Sig Start Date End Date Taking? Authorizing Provider  albuterol (PROVENTIL HFA;VENTOLIN HFA) 108 (90 Base) MCG/ACT inhaler Inhale 2 puffs into the lungs every 6 (six) hours as needed. 11/02/16   Schaevitz, Randall An, MD  allopurinol (ZYLOPRIM) 300 MG tablet Take 300 mg by mouth 2 (two) times daily.     [provider]  aspirin 81 MG EC tablet Take 81 mg by mouth daily.      [provider]  carvedilol (COREG) 25 MG tablet Take 25 mg by mouth 2 (two) times daily with a meal.    [provider]  cyclobenzaprine (FLEXERIL) 10 MG tablet Take 10 mg by mouth 3 (three) times daily as needed for muscle spasms.     [provider]  digoxin (LANOXIN) 0.125 MG tablet Take 1 tablet by mouth every 3rd day 11/10/17   Deboraha Sprang, MD  diphenhydrAMINE (BENADRYL) 25 MG tablet Take 25 mg by mouth at bedtime as needed for sleep.    [provider]  doxycycline (VIBRAMYCIN) 100 MG capsule Take 1 capsule (100 mg total) by mouth 2 (two) times daily. 11/02/16   Orbie Pyo, MD  furosemide (LASIX) 40 MG tablet TAKE 1 TABLET BY MOUTH TWICE DAILY Patient not taking: Reported on 04/03/2018 10/08/17   Baldwin Jamaica, PA-C  gabapentin (NEURONTIN) 300 MG capsule Take 300 mg by mouth 2 (two) times daily.    [provider]  guaiFENesin-codeine (ROBITUSSIN AC) 100-10 MG/5ML syrup Take 5 mLs by mouth 3 (three) times daily as needed for cough.    [provider]  hydrOXYzine (VISTARIL) 25 MG capsule Take 25 mg by mouth at bedtime as needed for anxiety (sleep).    [provider]  indomethacin (INDOCIN) 50 MG capsule Take 50 mg by mouth 3 (three) times daily. 12/09/17   [provider]  ipratropium-albuterol  (DUONEB) 0.5-2.5 (3) MG/3ML SOLN Take 3 mLs by nebulization every 4 (four) hours as needed (for wheezing).     [provider]  levothyroxine (SYNTHROID, LEVOTHROID) 112 MCG tablet Take 112 mcg by mouth daily.    [provider]  losartan (COZAAR) 50 MG tablet Take 25 mg by mouth daily. 12/17/10   Deboraha Sprang, MD  Melatonin 10 MG TABS Take 10 mg by mouth at bedtime as needed (sleep).    [provider]  Menthol, Topical Analgesic, (BENGAY EX) Apply 1 application topically daily as needed (back pain).    [provider]  metFORMIN (GLUCOPHAGE-XR) 500 MG 24 hr tablet Take 500 mg by mouth 2 (two) times daily. 03/05/18   [provider]  Omega-3 1000 MG CAPS Take 1 capsule by mouth 2 (two) times daily.    [provider]  omeprazole (PRILOSEC) 20 MG capsule Take 20 mg by mouth every morning.     [provider]  predniSONE (DELTASONE) 20 MG tablet Take 20 mg by mouth daily. 03/24/18   [provider]  Sennosides (EX-LAX PO) Take 2 tablets by mouth at bedtime as needed (constipation).    [provider]  Terbinafine HCl (LAMISIL SPRAY EX) Apply 1 spray topically daily as needed (foot itching).    [provider]  Tetrahydrozoline HCl (VISINE OP) Apply 1 drop to eye daily as needed (dry eyes).    [provider]  tiotropium (SPIRIVA) 18 MCG inhalation capsule Place 18 mcg into inhaler and inhale daily as needed (shortness of breath).     [provider]  traMADol (ULTRAM) 50 MG tablet Take 50 mg by mouth at bedtime as needed for moderate pain.     [provider]  venlafaxine (EFFEXOR) 75 MG tablet Take 75 mg by mouth 2 (two) times daily.    [provider]  vitamin B-12 (CYANOCOBALAMIN) 1000 MCG tablet Take 1,000 mcg by mouth 2 (two) times daily.     [provider]    Allergies Codeine and Contrast media [iodinated diagnostic agents]  Family History  Problem  Relation Age of Onset  . Hypertension Mother   . Breast cancer Mother   . Stroke Mother   . Heart disease Father   . Heart attack Sister   . Hypertension Brother   . Heart attack Sister   . Colon cancer Brother 40    Social History Social History   Tobacco Use  . Smoking status: Former Smoker    Packs/day: 3.00    Years: 20.00    Pack years: 60.00    Types: Cigarettes    Last attempt to quit: 02/04/2002    Years since quitting: 16.1  . Smokeless tobacco: Never Used  Substance Use Topics  . Alcohol use: No  . Drug use: No    Review of Systems Constitutional: No fever/chills Eyes: No visual changes. ENT: No sore throat. No stiff neck no neck pain Cardiovascular: Denies chest pain. Respiratory: Denies shortness of breath. Gastrointestinal:   no vomiting.  No diarrhea.  No constipation. Genitourinary: Negative for dysuria. Musculoskeletal: Negative lower extremity swelling Skin: Negative for rash. Neurological: Negative for severe headaches, focal weakness or numbness.   ____________________________________________   PHYSICAL EXAM:  VITAL SIGNS: ED Triage Vitals  Enc Vitals Group     BP 04/09/18 1951 (!) 69/58     Pulse Rate 04/09/18 1951 94     Resp 04/09/18 1951 19     Temp 04/09/18 1951 98.4 F (36.9 C)     Temp Source 04/09/18 1951 Oral     SpO2 04/09/18 1951 93 %     Weight 04/09/18 1953 230 lb (104.3 kg)     Height --      Head Circumference --      Peak Flow --      Pain Score --      Pain Loc --      Pain Edu? --      Excl. in [place]? --     Constitutional: Alert and oriented. Well appearing and in no acute distress. Eyes: Conjunctivae are normal Head: Atraumatic HEENT: No congestion/rhinnorhea. Mucous membranes are moist.  Oropharynx non-erythematous Neck:   Nontender with no meningismus, no masses, no stridor Cardiovascular: Normal rate, regular rhythm. Grossly normal heart sounds.  Good peripheral circulation. Respiratory: Normal respiratory  effort.  No retractions. Lungs CTAB. Abdominal: Soft and nontender. No distention. No guarding no rebound Back:  There is no focal tenderness or step off.  there is no midline tenderness there are no lesions noted. there is no CVA tenderness Musculoskeletal: No lower extremity tenderness, no upper extremity tenderness.  Noted from some finger pain on the right.  No joint effusions, no DVT signs strong distal pulses no edema Neurologic:  Normal speech and language. No gross focal neurologic deficits are appreciated.  Skin:  Skin is warm, dry and intact. No rash noted. Psychiatric: Mood and affect are normal. Speech and behavior are normal.  ____________________________________________   LABS (all labs ordered are listed, but only abnormal results are displayed)  Labs Reviewed  URINE CULTURE  CBC WITH DIFFERENTIAL/PLATELET  LACTIC ACID, PLASMA  LACTIC ACID, PLASMA  TROPONIN I  COMPREHENSIVE METABOLIC PANEL  URINALYSIS, COMPLETE (UACMP) WITH MICROSCOPIC  DIGOXIN LEVEL  INFLUENZA PANEL BY PCR (TYPE A & B)    Pertinent labs  results that were available during my care of the patient were reviewed by me and considered in my medical decision making (see chart for details). ____________________________________________  EKG  I personally interpreted any EKGs ordered by me or triage Atrially sensed V paced rhythm with rate of 90 bpm, no acute ST elevation or depression normal axis ____________________________________________  RADIOLOGY  Pertinent labs & imaging results that were available during my care of the patient were reviewed by me and considered in my medical decision making (see chart for details). If possible, patient and/or family made aware of any abnormal findings.  Dg Chest Port 1 View  Result Date: 04/09/2018 CLINICAL DATA:  Low blood pressure, possible fall. History of GERD, hypertension, COPD. EXAM: PORTABLE CHEST 1 VIEW COMPARISON:  Chest x-ray dated 11/02/2016. FINDINGS:  LEFT chest wall pacemaker leads appear stable in position. Heart size and mediastinal contours are stable. Lungs are clear. No pleural effusion or pneumothorax seen. Osseous structures about the chest are unremarkable. IMPRESSION: No active disease. No evidence of pneumonia or pulmonary edema. Electronically Signed   By: Franki Cabot M.D.   On: 04/09/2018 20:36   ____________________________________________    PROCEDURES  Procedure(s) performed: None  Procedures  Critical Care performed: CRITICAL CARE Performed by: Schuyler Amor   Total critical care time: 45 minutes  Critical care time was exclusive of separately billable procedures and treating other patients.  Critical care was necessary to treat or prevent imminent or life-threatening deterioration.  Critical care was time spent personally by me on the following activities: development of treatment plan with patient and/or surrogate as well as nursing, discussions with consultants, evaluation of patient's response to treatment, examination of patient, obtaining history from patient or surrogate, ordering and performing treatments and interventions, ordering and review of laboratory studies, ordering and review of radiographic studies, pulse oximetry and re-evaluation of patient's condition.   ____________________________________________   INITIAL IMPRESSION / ASSESSMENT AND PLAN / ED COURSE  Pertinent labs & imaging results that were available during my care of the patient were reviewed by me and considered in my medical decision making (see chart for details).  Presents with a syncopal event and low blood pressure, unclear exactly what happened.  We are going to obtain imaging to make sure she did not sustain a head injury, we will check her for cardiogenic and infectious causes such as UTI and pneumonia, at this time she looks quite well.  We will x-ray her hand and her chest and reassess.  No evidence of a hip fracture  fortunately she not complaining of any pain, she is satting actually in the low 90s on room air despite being on 4 L oxygen at home which I take to be a good sign.  We will evaluate cardiac enzymes.  I wonder if she may have taken extra medications, she did have a brief period of hypotension upon arrival here but it rapidly came up with IV fluid and we are watching it closely.  Her blood pressure is normally quite high.  Nothing to suggest that she is septic at this time however.  ----------------------------------------- 9:45 PM on 04/09/2018 -----------------------------------------  Family states that she is been having orthostatic syncopal events for the last 2 weeks.  I do suspect this may be from medication, blood work and other findings thus far reassuring.  Sure is responding to IV fluid.  Given her age and multiple syncopal events today we will consider admission.  He does have a small nondisplaced avulsion fracture looks like on her finger, we will place her in a splint.  Ortho will follow up inpatient as needed per hospital staff discussed with hospitalist they will admit.    ____________________________________________   FINAL CLINICAL IMPRESSION(S) / ED DIAGNOSES  Final diagnoses:  Syncope      This chart was dictated using voice recognition software.  Despite best efforts to proofread,  errors can occur which can change meaning.      Schuyler Amor, MD 04/09/18 2101    Schuyler Amor, MD 04/09/18 2149

## 2018-04-09 NOTE — ED Notes (Signed)
ED TO INPATIENT HANDOFF REPORT  ED Nurse Name and Phone #: Caryl Pina, RN 31  S Name/Age/Gender Stacy Grimes 68 y.o. female Room/Bed: ED10A/ED10A  Code Status   Code Status: Prior  Home/SNF/Other Home Patient oriented to: self, place, disoriented to situation Is this baseline? Yes   Triage Complete: Triage complete  Chief Complaint hypotension  Triage Note Pt here via ems , with report of low b/p and possible fall , resulting in a finger injury to the right hand . Pt a/o x3 but has hx of short term memory loss .   Allergies Allergies  Allergen Reactions  . Codeine Palpitations  . Contrast Media [Iodinated Diagnostic Agents] Rash    Level of Care/Admitting Diagnosis ED Disposition    ED Disposition Condition [facility]: [facility] [100120]  Level of Care: Telemetry [5]  Diagnosis: Syncope [539767]  Admitting Physician: Lance Coon [[id number]]  Attending Physician: Lance Coon [[id number]]  Bed request comments: 2a  PT Class (Do Not Modify): Observation [104]  PT Acc Code (Do Not Modify): Observation [10022]       B Medical/Surgery History Past Medical History:  Diagnosis Date  . AAA (abdominal aortic aneurysm) ([place])   . Anginal pain ([place])   . Anxiety   . Arthritis   . Automatic implantable cardioverter-defibrillator in situ   . Biventricular ICD  [employer]   . CHF (congestive heart failure) ([place])   . Chronic systolic heart failure ([place])   . COPD (chronic obstructive pulmonary disease) ([place])   . Deafness    left ear  . Depression   . Dizziness   . Dysrhythmia   . Fracture    history of spinal fracture  . GERD (gastroesophageal reflux disease)   . Hypertension   . Hypothyroidism   . Non-ischemic cardiomyopathy ([place])   . Oxygen dependent   . Palpitations   . Shortness of breath dyspnea   . Sleep apnea   . Wheezing    Past Surgical History:  Procedure Laterality Date  . ABDOMINAL HYSTERECTOMY    .  BIV ICD GENERATOR CHANGEOUT N/A 09/17/2016   Procedure: BiV ICD Generator Changeout;  Surgeon: Deboraha Sprang, MD;  Location: [facility];  Service: Cardiovascular;  Laterality: N/A;  . BLADDER SURGERY    . CARDIAC CATHETERIZATION    . CARDIAC DEFIBRILLATOR PLACEMENT  4 yrs ago   pacemaker  . CATARACT EXTRACTION W/PHACO Left 09/01/2014   Procedure: CATARACT EXTRACTION PHACO AND INTRAOCULAR LENS PLACEMENT (IOC);  Surgeon: Lyla Glassing, MD;  Location: [facility];  Service: Ophthalmology;  Laterality: Left;  [place]: 1:12.1   . CATARACT EXTRACTION W/PHACO Right 09/29/2014   Procedure: CATARACT EXTRACTION PHACO AND INTRAOCULAR LENS PLACEMENT (IOC);  Surgeon: Lyla Glassing, MD;  Location: [facility];  Service: Ophthalmology;  Laterality: Right;  [place]: 00:52.7   . CHOLECYSTECTOMY    . COLON SURGERY    . COLONOSCOPY    . COLONOSCOPY N/A 08/31/2012   Procedure: COLONOSCOPY;  Surgeon: Inda Castle, MD;  Location: [facility];  Service: Endoscopy;  Laterality: N/A;  . ESOPHAGOGASTRODUODENOSCOPY N/A 07/06/2012   Procedure: ESOPHAGOGASTRODUODENOSCOPY (EGD);  Surgeon: Lafayette Dragon, MD;  Location: [facility];  Service: Endoscopy;  Laterality: N/A;  . KNEE SURGERY Right   . MIDDLE EAR SURGERY    . SAVORY DILATION N/A 07/06/2012   Procedure: SAVORY DILATION;  Surgeon: Lafayette Dragon, MD;  Location: [facility];  Service: Endoscopy;  Laterality: N/A;  . WRIST SURGERY  Right      A IV Location/Drains/Wounds Patient Lines/Drains/Airways Status   Active Line/Drains/Airways    Name:   Placement date:   Placement time:   Site:   Days:   Peripheral IV 04/09/18 Antecubital   04/09/18    2054    Antecubital   less than 1   Airway   09/01/14    0828     1316   Incision (Closed) 09/01/14 Eye Left   09/01/14    0846     1316   Incision (Closed) 09/29/14 Eye Right   09/29/14    1043     1288          Intake/Output Last 24 hours No intake or output data in the 24 hours ending 04/09/18  2351  Labs/Imaging Results for orders placed or performed during the hospital encounter of 04/09/18 (from the past 48 hour(s))  CBC with Differential     Status: Abnormal   Collection Time: 04/09/18  7:59 PM  Result Value Ref Range   WBC 10.2 4.0 - 10.5 K/uL   RBC 3.78 (L) 3.87 - 5.11 MIL/uL   Hemoglobin 10.6 (L) 12.0 - 15.0 g/dL   HCT 33.2 (L) 36.0 - 46.0 %   MCV 87.8 80.0 - 100.0 fL   MCH 28.0 26.0 - 34.0 pg   MCHC 31.9 30.0 - 36.0 g/dL   RDW 15.7 (H) 11.5 - 15.5 %   Platelets 246 150 - 400 K/uL   nRBC 0.0 0.0 - 0.2 %   Neutrophils Relative % 71 %   Neutro Abs 7.3 1.7 - 7.7 K/uL   Lymphocytes Relative 18 %   Lymphs Abs 1.8 0.7 - 4.0 K/uL   Monocytes Relative 8 %   Monocytes Absolute 0.8 0.1 - 1.0 K/uL   Eosinophils Relative 2 %   Eosinophils Absolute 0.2 0.0 - 0.5 K/uL   Basophils Relative 1 %   Basophils Absolute 0.1 0.0 - 0.1 K/uL   Immature Granulocytes 0 %   Abs Immature Granulocytes 0.04 0.00 - 0.07 K/uL    Comment: Performed at [facility], [address] [place], [place] [address]  Troponin I - Once     Status: None   Collection Time: 04/09/18  7:59 PM  Result Value Ref Range   Troponin I <0.03 <0.03 ng/mL    Comment: Performed at [facility], [address] [place], [place] [address]  Comprehensive metabolic panel     Status: Abnormal   Collection Time: 04/09/18  7:59 PM  Result Value Ref Range   Sodium 134 (L) 135 - 145 mmol/L   Potassium 3.4 (L) 3.5 - 5.1 mmol/L   Chloride 90 (L) 98 - 111 mmol/L   CO2 32 22 - 32 mmol/L   Glucose, Bld 139 (H) 70 - 99 mg/dL   BUN 27 (H) 8 - 23 mg/dL   Creatinine, Ser 1.44 (H) 0.44 - 1.00 mg/dL   Calcium 8.7 (L) 8.9 - 10.3 mg/dL   Total Protein 7.5 6.5 - 8.1 g/dL   Albumin 3.4 (L) 3.5 - 5.0 g/dL   AST 17 15 - 41 U/L   ALT 12 0 - 44 U/L   Alkaline Phosphatase 109 38 - 126 U/L   Total Bilirubin 0.4 0.3 - 1.2 mg/dL   GFR calc non Af Amer 37 (L) >60 mL/min   GFR calc Af Amer 43 (L) >60 mL/min   Anion  gap 12 5 - 15    Comment: Performed at  [facility], [address], [place], [place] [address]  Digoxin level     Status: None   Collection Time: 04/09/18  7:59 PM  Result Value Ref Range   Digoxin Level 0.9 0.8 - 2.0 ng/mL    Comment: Performed at [facility], [address] [place], [place] [address]  Lactic acid, plasma     Status: None   Collection Time: 04/09/18  9:18 PM  Result Value Ref Range   Lactic Acid, Venous 1.4 0.5 - 1.9 mmol/L    Comment: Performed at [facility], [address], [place], [place] [address]  Influenza panel by PCR (type A & B)     Status: None   Collection Time: 04/09/18  9:18 PM  Result Value Ref Range   Influenza A By PCR NEGATIVE NEGATIVE   Influenza B By PCR NEGATIVE NEGATIVE    Comment: (NOTE) The Xpert Xpress Flu assay is intended as an aid in the diagnosis of  influenza and should not be used as a sole basis for treatment.  This  assay is FDA approved for nasopharyngeal swab specimens only. Nasal  washings and aspirates are unacceptable for Xpert Xpress Flu testing. Performed at [facility], [address] [place], [place] [address]   Urinalysis, Complete w Microscopic     Status: Abnormal   Collection Time: 04/09/18  9:25 PM  Result Value Ref Range   Color, Urine COLORLESS (A) YELLOW   APPearance CLEAR (A) CLEAR   Specific Gravity, Urine 1.005 1.005 - 1.030   pH 6.0 5.0 - 8.0   Glucose, UA NEGATIVE NEGATIVE mg/dL   Hgb urine dipstick NEGATIVE NEGATIVE   Bilirubin Urine NEGATIVE NEGATIVE   Ketones, ur NEGATIVE NEGATIVE mg/dL   Protein, ur NEGATIVE NEGATIVE mg/dL   Nitrite NEGATIVE NEGATIVE   Leukocytes,Ua NEGATIVE NEGATIVE   WBC, UA NONE SEEN 0 - 5 WBC/hpf   Bacteria, UA NONE SEEN NONE SEEN   Squamous Epithelial / LPF 0-5 0 - 5    Comment: Performed at [facility], [address], [place], [place] [address]   Ct Head Wo Contrast  Result Date: 04/09/2018 CLINICAL DATA:  Reported  fall. Low blood pressure. EXAM: CT HEAD WITHOUT CONTRAST TECHNIQUE: Contiguous axial images were obtained from the base of the skull through the vertex without intravenous contrast. COMPARISON:  CT 02/20/2015 FINDINGS: Brain: Brain volume is normal for age. Mild chronic small vessel ischemia. No intracranial hemorrhage, mass effect, or midline shift. No hydrocephalus. The basilar cisterns are patent. No evidence of territorial infarct or acute ischemia. No extra-axial or intracranial fluid collection. Vascular: No hyperdense vessel. Skull: No fracture or focal lesion. Sinuses/Orbits: Bilateral lens extraction. Small fluid level in left side of sphenoid sinus, remaining paranasal sinuses are clear. The mastoid air cells are well-aerated. Other: None. IMPRESSION: No acute intracranial abnormality. No skull fracture. Electronically Signed   By: Keith Rake M.D.   On: 04/09/2018 21:01   Dg Chest Port 1 View  Result Date: 04/09/2018 CLINICAL DATA:  Low blood pressure, possible fall. History of GERD, hypertension, COPD. EXAM: PORTABLE CHEST 1 VIEW COMPARISON:  Chest x-ray dated 11/02/2016. FINDINGS: LEFT chest wall pacemaker leads appear stable in position. Heart size and mediastinal contours are stable. Lungs are clear. No pleural effusion or pneumothorax seen. Osseous structures about the chest are unremarkable. IMPRESSION: No active disease. No evidence of pneumonia or pulmonary edema. Electronically Signed   By: Franki Cabot M.D.   On: 04/09/2018 20:36  Dg Hand Complete Right  Result Date: 04/09/2018 CLINICAL DATA:  Hand pain distal ring finger EXAM: RIGHT HAND - COMPLETE 3+ VIEW COMPARISON:  None. FINDINGS: Surgical plate and fixating screws extending from the distal radius to the third metacarpal, traversing the carpal bones. Acute displaced intra-articular fracture involving the volar base of the fourth distal phalanx. Fused appearance of the carpal bones. IMPRESSION: Acute mildly displaced  intra-articular fracture involving the dorsal base of the fourth distal phalanx Electronically Signed   By: Donavan Foil M.D.   On: 04/09/2018 21:02    Pending Labs Unresulted Labs (From admission, onward)    Start     Ordered   04/09/18 2022  Lactic acid, plasma  Now then every 2 hours,   STAT     04/09/18 2021   04/09/18 2022  Urine culture  Once,   STAT     04/09/18 2021   Signed and Held  HIV antibody (Routine Testing)  Once,   R     Signed and Held   Signed and Held  CBC  (enoxaparin (LOVENOX)    CrCl >/= 30 ml/min)  Once,   R    Comments:  Baseline for enoxaparin therapy IF NOT ALREADY DRAWN.  Notify MD if PLT < 100 K.    Signed and Held   Signed and Held  Creatinine, serum  (enoxaparin (LOVENOX)    CrCl >/= 30 ml/min)  Once,   R    Comments:  Baseline for enoxaparin therapy IF NOT ALREADY DRAWN.    Signed and Held   Signed and Held  Creatinine, serum  (enoxaparin (LOVENOX)    CrCl >/= 30 ml/min)  Weekly,   R    Comments:  while on enoxaparin therapy    Signed and Held   Signed and Held  Basic metabolic panel  Tomorrow morning,   R     Signed and Held   Signed and Held  CBC  Tomorrow morning,   R     Signed and Held          Vitals/Pain Today's Vitals   04/09/18 2000 04/09/18 2030 04/09/18 2130 04/09/18 2300  BP: (!) 107/57 98/86 (!) 128/113 125/65  Pulse:  98 93 93  Resp: 15 (!) 22 19 17   Temp:      TempSrc:      SpO2:  93% 99% 97%  Weight:        Isolation Precautions No active isolations  Medications Medications  sodium chloride 0.9 % bolus 500 mL (0 mLs Intravenous Stopped 04/09/18 2204)    Mobility walks with person assist High fall risk   Focused Assessments Cardiac Assessment Handoff:    Lab Results  Component Value Date   TROPONINI <0.03 04/09/2018   No results found for: DDIMER Does the Patient currently have chest pain? No     R Recommendations: See Admitting Provider Note  Report given to: Lexi, RN  Additional Notes: Short  term memory loss

## 2018-04-10 ENCOUNTER — Other Ambulatory Visit: Payer: Self-pay

## 2018-04-10 ENCOUNTER — Encounter: Payer: Self-pay | Admitting: *Deleted

## 2018-04-10 ENCOUNTER — Ambulatory Visit (INDEPENDENT_AMBULATORY_CARE_PROVIDER_SITE_OTHER): Payer: Medicare Other

## 2018-04-10 ENCOUNTER — Observation Stay (HOSPITAL_COMMUNITY)
Admit: 2018-04-10 | Discharge: 2018-04-10 | Disposition: A | Discharge status: Home / Self Care | Payer: Medicare Other | Attending: Internal Medicine | Admitting: Internal Medicine

## 2018-04-10 DIAGNOSIS — E876 Hypokalemia: Secondary | ICD-10-CM | POA: Diagnosis present

## 2018-04-10 DIAGNOSIS — I714 Abdominal aortic aneurysm, without rupture: Secondary | ICD-10-CM | POA: Diagnosis present

## 2018-04-10 DIAGNOSIS — E118 Type 2 diabetes mellitus with unspecified complications: Secondary | ICD-10-CM | POA: Diagnosis present

## 2018-04-10 DIAGNOSIS — J9611 Chronic respiratory failure with hypoxia: Secondary | ICD-10-CM | POA: Diagnosis present

## 2018-04-10 DIAGNOSIS — G8929 Other chronic pain: Secondary | ICD-10-CM | POA: Diagnosis present

## 2018-04-10 DIAGNOSIS — E039 Hypothyroidism, unspecified: Secondary | ICD-10-CM | POA: Diagnosis present

## 2018-04-10 DIAGNOSIS — R000 Tachycardia, unspecified: Secondary | ICD-10-CM | POA: Diagnosis present

## 2018-04-10 DIAGNOSIS — Z95810 Presence of automatic (implantable) cardiac defibrillator: Secondary | ICD-10-CM | POA: Diagnosis not present

## 2018-04-10 DIAGNOSIS — Y92009 Unspecified place in unspecified non-institutional (private) residence as the place of occurrence of the external cause: Secondary | ICD-10-CM | POA: Diagnosis not present

## 2018-04-10 DIAGNOSIS — D649 Anemia, unspecified: Secondary | ICD-10-CM | POA: Diagnosis not present

## 2018-04-10 DIAGNOSIS — F419 Anxiety disorder, unspecified: Secondary | ICD-10-CM | POA: Diagnosis present

## 2018-04-10 DIAGNOSIS — W19XXXA Unspecified fall, initial encounter: Secondary | ICD-10-CM | POA: Diagnosis present

## 2018-04-10 DIAGNOSIS — T502X5A Adverse effect of carbonic-anhydrase inhibitors, benzothiadiazides and other diuretics, initial encounter: Secondary | ICD-10-CM | POA: Diagnosis present

## 2018-04-10 DIAGNOSIS — R413 Other amnesia: Secondary | ICD-10-CM | POA: Diagnosis present

## 2018-04-10 DIAGNOSIS — I5042 Chronic combined systolic (congestive) and diastolic (congestive) heart failure: Secondary | ICD-10-CM | POA: Diagnosis present

## 2018-04-10 DIAGNOSIS — F329 Major depressive disorder, single episode, unspecified: Secondary | ICD-10-CM | POA: Diagnosis present

## 2018-04-10 DIAGNOSIS — E669 Obesity, unspecified: Secondary | ICD-10-CM | POA: Diagnosis present

## 2018-04-10 DIAGNOSIS — I5022 Chronic systolic (congestive) heart failure: Secondary | ICD-10-CM

## 2018-04-10 DIAGNOSIS — I110 Hypertensive heart disease with heart failure: Secondary | ICD-10-CM | POA: Diagnosis present

## 2018-04-10 DIAGNOSIS — R110 Nausea: Secondary | ICD-10-CM | POA: Diagnosis not present

## 2018-04-10 DIAGNOSIS — G4733 Obstructive sleep apnea (adult) (pediatric): Secondary | ICD-10-CM | POA: Diagnosis present

## 2018-04-10 DIAGNOSIS — R55 Syncope and collapse: Secondary | ICD-10-CM | POA: Diagnosis present

## 2018-04-10 DIAGNOSIS — D638 Anemia in other chronic diseases classified elsewhere: Secondary | ICD-10-CM | POA: Diagnosis present

## 2018-04-10 DIAGNOSIS — F0390 Unspecified dementia without behavioral disturbance: Secondary | ICD-10-CM | POA: Diagnosis present

## 2018-04-10 DIAGNOSIS — I420 Dilated cardiomyopathy: Secondary | ICD-10-CM | POA: Diagnosis present

## 2018-04-10 DIAGNOSIS — R4182 Altered mental status, unspecified: Secondary | ICD-10-CM | POA: Diagnosis present

## 2018-04-10 DIAGNOSIS — E860 Dehydration: Secondary | ICD-10-CM | POA: Diagnosis present

## 2018-04-10 DIAGNOSIS — I10 Essential (primary) hypertension: Secondary | ICD-10-CM | POA: Diagnosis not present

## 2018-04-10 DIAGNOSIS — N179 Acute kidney failure, unspecified: Secondary | ICD-10-CM | POA: Diagnosis present

## 2018-04-10 DIAGNOSIS — I951 Orthostatic hypotension: Secondary | ICD-10-CM | POA: Diagnosis present

## 2018-04-10 DIAGNOSIS — S62664A Nondisplaced fracture of distal phalanx of right ring finger, initial encounter for closed fracture: Secondary | ICD-10-CM | POA: Diagnosis present

## 2018-04-10 LAB — BASIC METABOLIC PANEL
Anion gap: 12 (ref 5–15)
BUN: 24 mg/dL — ABNORMAL HIGH (ref 8–23)
CO2: 33 mmol/L — ABNORMAL HIGH (ref 22–32)
Calcium: 8.5 mg/dL — ABNORMAL LOW (ref 8.9–10.3)
Chloride: 91 mmol/L — ABNORMAL LOW (ref 98–111)
Creatinine, Ser: 1.37 mg/dL — ABNORMAL HIGH (ref 0.44–1.00)
GFR calc Af Amer: 46 mL/min — ABNORMAL LOW (ref >60)
GFR calc non Af Amer: 40 mL/min — ABNORMAL LOW (ref >60)
Glucose, Bld: 137 mg/dL — ABNORMAL HIGH (ref 70–99)
Potassium: 3 mmol/L — ABNORMAL LOW (ref 3.5–5.1)
Sodium: 136 mmol/L (ref 135–145)

## 2018-04-10 LAB — CBC
HCT: 32.5 % — ABNORMAL LOW (ref 36.0–46.0)
HEMOGLOBIN: 10.3 g/dL — AB (ref 12.0–15.0)
MCH: 27.8 pg (ref 26.0–34.0)
MCHC: 31.7 g/dL (ref 30.0–36.0)
MCV: 87.8 fL (ref 80.0–100.0)
NRBC: 0 % (ref 0.0–0.2)
Platelets: 239 10*3/uL (ref 150–400)
RBC: 3.7 MIL/uL — ABNORMAL LOW (ref 3.87–5.11)
RDW: 15.6 % — ABNORMAL HIGH (ref 11.5–15.5)
WBC: 11 10*3/uL — ABNORMAL HIGH (ref 4.0–10.5)

## 2018-04-10 LAB — GLUCOSE, CAPILLARY
GLUCOSE-CAPILLARY: 113 mg/dL — AB (ref 70–99)
Glucose-Capillary: 111 mg/dL — ABNORMAL HIGH (ref 70–99)
Glucose-Capillary: 118 mg/dL — ABNORMAL HIGH (ref 70–99)
Glucose-Capillary: 124 mg/dL — ABNORMAL HIGH (ref 70–99)

## 2018-04-10 LAB — ECHOCARDIOGRAM COMPLETE
Height: 63 in
Weight: 2830.4 oz

## 2018-04-10 LAB — MAGNESIUM: Magnesium: 1.1 mg/dL — ABNORMAL LOW (ref 1.7–2.4)

## 2018-04-10 MED ORDER — ENOXAPARIN SODIUM 40 MG/0.4ML ~~LOC~~ SOLN
40.0000 mg | SUBCUTANEOUS | Status: DC
  Administered 2018-04-10: 40 mg via SUBCUTANEOUS
  Filled 2018-04-10: qty 0.4

## 2018-04-10 MED ORDER — INSULIN ASPART 100 UNIT/ML ~~LOC~~ SOLN: 0.0000 [IU] | Freq: Three times a day (TID) | SUBCUTANEOUS | Status: DC

## 2018-04-10 MED ORDER — PERFLUTREN LIPID MICROSPHERE
1.0000 mL | INTRAVENOUS | Status: AC | PRN
  Administered 2018-04-10: 2 mL via INTRAVENOUS
  Filled 2018-04-10: qty 10

## 2018-04-10 MED ORDER — LEVOTHYROXINE SODIUM 112 MCG PO TABS
112.0000 ug | ORAL_TABLET | Freq: Every day | ORAL | Status: DC
  Administered 2018-04-10 – 2018-04-12 (×3): 112 ug via ORAL
  Filled 2018-04-10 (×3): qty 1

## 2018-04-10 MED ORDER — INSULIN ASPART 100 UNIT/ML ~~LOC~~ SOLN: 0.0000 [IU] | Freq: Every day | SUBCUTANEOUS | Status: DC

## 2018-04-10 MED ORDER — ONDANSETRON HCL 4 MG PO TABS
4.0000 mg | ORAL_TABLET | Freq: Four times a day (QID) | ORAL | Status: DC | PRN
  Administered 2018-04-10: 4 mg via ORAL
  Filled 2018-04-10: qty 1

## 2018-04-10 MED ORDER — POTASSIUM CHLORIDE CRYS ER 20 MEQ PO TBCR
40.0000 meq | EXTENDED_RELEASE_TABLET | ORAL | Status: AC
  Administered 2018-04-10 (×2): 40 meq via ORAL
  Filled 2018-04-10 (×2): qty 2

## 2018-04-10 MED ORDER — ACETAMINOPHEN 650 MG RE SUPP: 650.0000 mg | Freq: Four times a day (QID) | RECTAL | Status: DC | PRN

## 2018-04-10 MED ORDER — ASPIRIN EC 81 MG PO TBEC
81.0000 mg | DELAYED_RELEASE_TABLET | Freq: Every day | ORAL | Status: DC
  Administered 2018-04-10 – 2018-04-12 (×3): 81 mg via ORAL
  Filled 2018-04-10 (×3): qty 1

## 2018-04-10 MED ORDER — INSULIN ASPART 100 UNIT/ML ~~LOC~~ SOLN
0.0000 [IU] | Freq: Three times a day (TID) | SUBCUTANEOUS | Status: DC
  Administered 2018-04-11 – 2018-04-12 (×2): 1 [IU] via SUBCUTANEOUS
  Filled 2018-04-10 (×2): qty 1

## 2018-04-10 MED ORDER — PANTOPRAZOLE SODIUM 40 MG PO TBEC
40.0000 mg | DELAYED_RELEASE_TABLET | Freq: Every day | ORAL | Status: DC
  Administered 2018-04-10 – 2018-04-12 (×3): 40 mg via ORAL
  Filled 2018-04-10 (×3): qty 1

## 2018-04-10 MED ORDER — MAGNESIUM SULFATE 4 GM/100ML IV SOLN
4.0000 g | Freq: Once | INTRAVENOUS | Status: AC
  Administered 2018-04-10: 4 g via INTRAVENOUS
  Filled 2018-04-10: qty 100

## 2018-04-10 MED ORDER — TIOTROPIUM BROMIDE MONOHYDRATE 18 MCG IN CAPS
18.0000 ug | ORAL_CAPSULE | Freq: Every day | RESPIRATORY_TRACT | Status: DC | PRN
  Filled 2018-04-10: qty 5

## 2018-04-10 MED ORDER — ORAL CARE MOUTH RINSE
15.0000 mL | Freq: Two times a day (BID) | OROMUCOSAL | Status: DC
  Administered 2018-04-12: 15 mL via OROMUCOSAL

## 2018-04-10 MED ORDER — ACETAMINOPHEN 325 MG PO TABS
650.0000 mg | ORAL_TABLET | Freq: Four times a day (QID) | ORAL | Status: DC | PRN
  Administered 2018-04-10 – 2018-04-11 (×4): 650 mg via ORAL
  Filled 2018-04-10 (×4): qty 2

## 2018-04-10 MED ORDER — ONDANSETRON HCL 4 MG/2ML IJ SOLN
4.0000 mg | Freq: Four times a day (QID) | INTRAMUSCULAR | Status: DC | PRN
  Administered 2018-04-10: 4 mg via INTRAVENOUS
  Filled 2018-04-10: qty 2

## 2018-04-10 NOTE — ED Notes (Signed)
Hospitalist to bedside.

## 2018-04-10 NOTE — Consult Note (Addendum)
Cardiology Consultation:   Patient ID: INELLA KUWAHARA MRN: [id number]; DOB: March 08, 1950  Admit date: 04/09/2018 Date of Consult: 04/10/2018  Primary Care Provider: Cyndi Bender, PA-C Primary Cardiologist: Virl Axe, MD Primary Electrophysiologist:  Virl Axe, MD    Patient Profile:   TAYLYN BRAME is a 68 y.o. female with a hx of orthostatic hypotension/dizziness, NICM with ICD (Dr. Caryl Comes, [place] with change-out 2018), COPD chronically on O2, chronic HFrEF, HTN, AAA, hypothyroidism, dementia /memory loss, previous smoker with 60 pk yr history, and sleep apnea who is being seen today for the evaluation of syncope at the request of Dr. Jannifer Franklin.  History of Present Illness:   Ms. Fehnel is a 68 yo female with PMH as above. She has a remote history of smoking but does not drink or use drugs. Most of history obtained via EMR and husband as patient reportedly has progressive memory loss / poor memory with family reporting this is her baseline. Per EMR, patient has a history of orthostatic hypotension and feeling of presyncope including dizziness/lightheadedness.  She is chronically SOB with DOE and on chronic O2. She also has documented chronic chest pain dating back many years that is not exertional or positional documented as felt to be musculoskeletal.   2010 cardiac catheterization showed EF 15-20% without obstructive CAD.  Repeat echo 2018 (ruled a limited study) suggestive of normal to low left ventricular ejection fraction. ICD implant 2011 by Dr. Caryl Comes for NICM [facility] 2011 CRT-D; BiV ICD generator changeo-ut 8/147/2018). Followed by Dr. Caryl Comes with most recent visit 07/16/2017 for follow-up of ICD. At that time, no noted chest pain.  She had chronic mild edema but was noted to be euvolemic at the time of her visit.  She was also documented as extremely short of breath, tachycardic, and hypoxic with minimal exertion. With ambulation, her heart rate was 130s bpm and oxygen saturations 80s.   It was recommended she follow-up urgently with her pulmonary physician, whom she had not seen in over a year.  ICD interrogation reviewed with noted AT with AMS and no changes made to programming.  EKG was sinus at rate of 98 bpm.  Labs showed stable hemoglobin and mild hyperkalemia.  On 3/5, she presented to [facility] ED c/o syncope x2 with one episode in bed and the other near her bedroom door. She reportedly may have taken too much of her medication. She did report a cough but without associated fever, chills, nausea, or emsis. In ED, hypotensive with BP 69/58, HR 94, hypokalemic at 3.0. Cr 1.37. Lactic acid 1.4. WBC 11.0. Hgb 10.3. EKG without acute changes. CXR without acute changes. Admitted for syncope with ICD checked 3/5 and scan under CV procedures tab and stating AMS with AT above MTR (1:1 conduction) and RA oversensing. Pending repeat interrogation with representative contacted 3/6.  Past Medical History:  Diagnosis Date  . AAA (abdominal aortic aneurysm) ([place])   . Anginal pain ([place])   . Anxiety   . Arthritis   . Automatic implantable cardioverter-defibrillator in situ   . Biventricular ICD  [employer]   . CHF (congestive heart failure) ([place])   . Chronic systolic heart failure ([place])   . COPD (chronic obstructive pulmonary disease) ([place])   . Deafness    left ear  . Depression   . Dizziness   . Dysrhythmia   . Fracture    history of spinal fracture  . GERD (gastroesophageal reflux disease)   . Hypertension   . Hypothyroidism   .  Non-ischemic cardiomyopathy ([place])   . Oxygen dependent   . Palpitations   . Shortness of breath dyspnea   . Sleep apnea   . Wheezing     Past Surgical History:  Procedure Laterality Date  . ABDOMINAL HYSTERECTOMY    . BIV ICD GENERATOR CHANGEOUT N/A 09/17/2016   Procedure: BiV ICD Generator Changeout;  Surgeon: Deboraha Sprang, MD;  Location: [place] [facility];  Service: Cardiovascular;  Laterality: N/A;  . BLADDER SURGERY    . CARDIAC  CATHETERIZATION    . CARDIAC DEFIBRILLATOR PLACEMENT  4 yrs ago   pacemaker  . CATARACT EXTRACTION W/PHACO Left 09/01/2014   Procedure: CATARACT EXTRACTION PHACO AND INTRAOCULAR LENS PLACEMENT (IOC);  Surgeon: Lyla Glassing, MD;  Location: [facility] ORS;  Service: Ophthalmology;  Laterality: Left;  [place]: 1:12.1   . CATARACT EXTRACTION W/PHACO Right 09/29/2014   Procedure: CATARACT EXTRACTION PHACO AND INTRAOCULAR LENS PLACEMENT (IOC);  Surgeon: Lyla Glassing, MD;  Location: [facility] ORS;  Service: Ophthalmology;  Laterality: Right;  [place]: 00:52.7   . CHOLECYSTECTOMY    . COLON SURGERY    . COLONOSCOPY    . COLONOSCOPY N/A 08/31/2012   Procedure: COLONOSCOPY;  Surgeon: Inda Castle, MD;  Location: [facility];  Service: Endoscopy;  Laterality: N/A;  . ESOPHAGOGASTRODUODENOSCOPY N/A 07/06/2012   Procedure: ESOPHAGOGASTRODUODENOSCOPY (EGD);  Surgeon: Lafayette Dragon, MD;  Location: [facility];  Service: Endoscopy;  Laterality: N/A;  . KNEE SURGERY Right   . MIDDLE EAR SURGERY    . SAVORY DILATION N/A 07/06/2012   Procedure: SAVORY DILATION;  Surgeon: Lafayette Dragon, MD;  Location: [facility];  Service: Endoscopy;  Laterality: N/A;  . WRIST SURGERY Right      Home Medications:  Prior to Admission medications   Medication Sig Start Date Arien Morine Date Taking? Authorizing Provider  albuterol (PROVENTIL HFA;VENTOLIN HFA) 108 (90 Base) MCG/ACT inhaler Inhale 2 puffs into the lungs every 6 (six) hours as needed. 11/02/16   Schaevitz, Randall An, MD  allopurinol (ZYLOPRIM) 300 MG tablet Take 300 mg by mouth 2 (two) times daily.     [provider]  aspirin 81 MG EC tablet Take 81 mg by mouth daily.      [provider]  carvedilol (COREG) 25 MG tablet Take 25 mg by mouth 2 (two) times daily with a meal.    [provider]  cyclobenzaprine (FLEXERIL) 10 MG tablet Take 10 mg by mouth 3 (three) times daily as needed for muscle spasms.     [provider]  digoxin (LANOXIN)  0.125 MG tablet Take 1 tablet by mouth every 3rd day 11/10/17   Deboraha Sprang, MD  diphenhydrAMINE (BENADRYL) 25 MG tablet Take 25 mg by mouth at bedtime as needed for sleep.    [provider]  doxycycline (VIBRAMYCIN) 100 MG capsule Take 1 capsule (100 mg total) by mouth 2 (two) times daily. 11/02/16   Orbie Pyo, MD  furosemide (LASIX) 40 MG tablet TAKE 1 TABLET BY MOUTH TWICE DAILY Patient not taking: Reported on 04/03/2018 10/08/17   [place], PA-C  gabapentin (NEURONTIN) 300 MG capsule Take 300 mg by mouth 2 (two) times daily.    [provider]  guaiFENesin-codeine (ROBITUSSIN AC) 100-10 MG/5ML syrup Take 5 mLs by mouth 3 (three) times daily as needed for cough.    [provider]  hydrOXYzine (VISTARIL) 25 MG capsule Take 25 mg by mouth at bedtime as needed for anxiety (sleep).    [provider]  indomethacin (INDOCIN) 50 MG capsule Take 50 mg by mouth 3 (three) times daily. 12/09/17   [provider]  ipratropium-albuterol (DUONEB) 0.5-2.5 (3) MG/3ML SOLN Take 3 mLs by nebulization every 4 (four) hours as needed (for wheezing).     [provider]  levothyroxine (SYNTHROID, LEVOTHROID) 112 MCG tablet Take 112 mcg by mouth daily.    [provider]  losartan (COZAAR) 50 MG tablet Take 25 mg by mouth daily. 12/17/10   Deboraha Sprang, MD  Melatonin 10 MG TABS Take 10 mg by mouth at bedtime as needed (sleep).    [provider]  Menthol, Topical Analgesic, (BENGAY EX) Apply 1 application topically daily as needed (back pain).    [provider]  metFORMIN (GLUCOPHAGE-XR) 500 MG 24 hr tablet Take 500 mg by mouth 2 (two) times daily. 03/05/18   [provider]  Omega-3 1000 MG CAPS Take 1 capsule by mouth 2 (two) times daily.    [provider]  omeprazole (PRILOSEC) 20 MG capsule Take 20 mg by mouth every morning.     [provider]  predniSONE (DELTASONE) 20 MG tablet  Take 20 mg by mouth daily. 03/24/18   [provider]  Sennosides (EX-LAX PO) Take 2 tablets by mouth at bedtime as needed (constipation).    [provider]  Terbinafine HCl (LAMISIL SPRAY EX) Apply 1 spray topically daily as needed (foot itching).    [provider]  Tetrahydrozoline HCl (VISINE OP) Apply 1 drop to eye daily as needed (dry eyes).    [provider]  tiotropium (SPIRIVA) 18 MCG inhalation capsule Place 18 mcg into inhaler and inhale daily as needed (shortness of breath).     [provider]  traMADol (ULTRAM) 50 MG tablet Take 50 mg by mouth at bedtime as needed for moderate pain.     [provider]  venlafaxine (EFFEXOR) 75 MG tablet Take 75 mg by mouth 2 (two) times daily.    [provider]  vitamin B-12 (CYANOCOBALAMIN) 1000 MCG tablet Take 1,000 mcg by mouth 2 (two) times daily.     [provider]    Inpatient Medications: Scheduled Meds: . aspirin EC  81 mg Oral Daily  . enoxaparin (LOVENOX) injection  40 mg Subcutaneous Q24H  . levothyroxine  112 mcg Oral QAC breakfast  . mouth rinse  15 mL Mouth Rinse BID  . pantoprazole  40 mg Oral Daily   Continuous Infusions:  PRN Meds: acetaminophen **OR** acetaminophen, ondansetron **OR** ondansetron (ZOFRAN) IV, tiotropium  Allergies:    Allergies  Allergen Reactions  . Codeine Palpitations  . Contrast Media [Iodinated Diagnostic Agents] Rash    Social History:   Social History   Socioeconomic History  . Marital status: Married    Spouse name: Not on file  . Number of children: 3  . Years of education: Not on file  . Highest education level: Not on file  Occupational History  . Occupation: Retired    Fish farm manager: DISABLED  Social Needs  . Financial resource strain: Not on file  . Food insecurity:    Worry: Not on file    Inability: Not on file  . Transportation needs:    Medical: Not on file    Non-medical: Not on file  Tobacco Use    . Smoking status: Former Smoker    Packs/day: 3.00    Years: 20.00    Pack years: 60.00    Types: Cigarettes  Last attempt to quit: 02/04/2002    Years since quitting: 16.1  . Smokeless tobacco: Never Used  Substance and Sexual Activity  . Alcohol use: No  . Drug use: No  . Sexual activity: Not on file  Lifestyle  . Physical activity:    Days per week: Not on file    Minutes per session: Not on file  . Stress: Not on file  Relationships  . Social connections:    Talks on phone: Not on file    Gets together: Not on file    Attends religious service: Not on file    Active member of club or organization: Not on file    Attends meetings of clubs or organizations: Not on file    Relationship status: Not on file  . Intimate partner violence:    Fear of current or ex partner: Not on file    Emotionally abused: Not on file    Physically abused: Not on file    Forced sexual activity: Not on file  Other Topics Concern  . Not on file  Social History Narrative  . Not on file    Family History:    Family History  Problem Relation Age of Onset  . Hypertension Mother   . Breast cancer Mother   . Stroke Mother   . Heart disease Father   . Heart attack Sister   . Hypertension Brother   . Heart attack Sister   . Colon cancer Brother 83     ROS:  Please see the history of present illness.   Review of Systems  Unable to perform ROS: Dementia  Constitutional: Negative for chills, fever and malaise/fatigue.  Respiratory: Negative for shortness of breath.        Chronic O2  Cardiovascular: Negative for chest pain, palpitations, orthopnea, leg swelling and PND.  Gastrointestinal: Positive for abdominal pain and nausea. Negative for blood in stool, constipation, diarrhea, heartburn, melena and vomiting.       Current nausea, stating etiology as not having yet eaten. On exam LLQ TTP  Genitourinary: Negative for dysuria.  Musculoskeletal: Positive for falls. Negative for myalgias.        Patient reporting 1 fall. EMR states family reporting 2 falls  Neurological: Negative for dizziness, loss of consciousness, weakness and headaches.       Suspect LOC as patient stated "was standing then I was on the floor, but I don't know why I fell;" however, patient denies  Psychiatric/Behavioral: Positive for memory loss.       Per EMR, family reports this is ongoing / baseline dementia    All other ROS reviewed and negative.     Physical Exam/Data:   Vitals:   04/10/18 0759 04/10/18 0913 04/10/18 0915 04/10/18 0917  BP: 108/70 109/79 (!) 90/57 (!) 80/52  Pulse: (!) 101 (!) 102 (!) 107 (!) 127  Resp: 20     Temp: 98.6 F (37 C)     TempSrc: Oral     SpO2: 98%     Weight:      Height:        Intake/Output Summary (Last 24 hours) at 04/10/2018 0947 Last data filed at 04/10/2018 0230 Gross per 24 hour  Intake 240 ml  Output -  Net 240 ml   Filed Weights   04/09/18 1953 04/10/18 0031  Weight: 104.3 kg 80.2 kg   Body mass index is 31.34 kg/m.  General:  Well nourished, well developed, in no acute distress HEENT: normal Neck:  no JVD Vascular: Radial pulses 2+ bilaterally   Cardiac:  Tachycardic but regular, S1, S2; no murmur  Lungs:  Reduced breath sounds bilaterally, no wheezing, rhonchi or rales  Abd: soft, nontender, no hepatomegaly  Ext: no bilateral lower extremity edema Musculoskeletal:  No deformities Skin: warm and dry  Neuro:  no focal abnormalities noted Psych:  Normal affect   EKG: Refer to HPI. SR, no acute changes Telemetry:  Telemetry was personally reviewed and demonstrates:  Sinus tachycardia  CV Studies:   Relevant CV Studies: Pending updated echo  09/30/2016 TTE Study Conclusions - Procedure narrative: Transthoracic echocardiography. Image   quality was suboptimal. The study was technically difficult.   Intravenous contrast (Definity) was administered. - Left ventricle: The cavity size was normal. There was an   increased relative  contribution of atrial contraction to   ventricular filling. Doppler parameters are consistent with   abnormal left ventricular relaxation (grade 1 diastolic   dysfunction). Doppler parameters are consistent with high   ventricular filling pressure. - Aortic valve: Poorly visualized. - Left atrium: Poorly visualized. - Right ventricle: Poorly visualized. - Pulmonic valve: Poorly visualized. Impressions: - Very suboptimal 2D echo with poor acoustical windows. Overall LVF   appears to be at least low normal with possible inferoseptal HK.   Consider cardiac MRI for better assessment of wall motion and   valves.  Laboratory Data:  Chemistry Recent Labs  Lab 04/09/18 1959 04/10/18 0104  NA 134* 136  K 3.4* 3.0*  CL 90* 91*  CO2 32 33*  GLUCOSE 139* 137*  BUN 27* 24*  CREATININE 1.44* 1.37*  CALCIUM 8.7* 8.5*  GFRNONAA 37* 40*  GFRAA 43* 46*  ANIONGAP 12 12    Recent Labs  Lab 04/09/18 1959  PROT 7.5  ALBUMIN 3.4*  AST 17  ALT 12  ALKPHOS 109  BILITOT 0.4   Hematology Recent Labs  Lab 04/09/18 1959 04/10/18 0104  WBC 10.2 11.0*  RBC 3.78* 3.70*  HGB 10.6* 10.3*  HCT 33.2* 32.5*  MCV 87.8 87.8  MCH 28.0 27.8  MCHC 31.9 31.7  RDW 15.7* 15.6*  PLT 246 239   Cardiac Enzymes Recent Labs  Lab 04/09/18 1959  TROPONINI <0.03   No results for input(s): TROPIPOC in the last 168 hours.  BNPNo results for input(s): BNP, PROBNP in the last 168 hours.  DDimer No results for input(s): DDIMER in the last 168 hours.  Radiology/Studies:  Ct Head Wo Contrast  Result Date: 04/09/2018 CLINICAL DATA:  Reported fall. Low blood pressure. EXAM: CT HEAD WITHOUT CONTRAST TECHNIQUE: Contiguous axial images were obtained from the base of the skull through the vertex without intravenous contrast. COMPARISON:  CT 02/20/2015 FINDINGS: Brain: Brain volume is normal for age. Mild chronic small vessel ischemia. No intracranial hemorrhage, mass effect, or midline shift. No hydrocephalus.  The basilar cisterns are patent. No evidence of territorial infarct or acute ischemia. No extra-axial or intracranial fluid collection. Vascular: No hyperdense vessel. Skull: No fracture or focal lesion. Sinuses/Orbits: Bilateral lens extraction. Small fluid level in left side of sphenoid sinus, remaining paranasal sinuses are clear. The mastoid air cells are well-aerated. Other: None. IMPRESSION: No acute intracranial abnormality. No skull fracture. Electronically Signed   By: Keith Rake M.D.   On: 04/09/2018 21:01   Dg Chest Port 1 View  Result Date: 04/09/2018 CLINICAL DATA:  Low blood pressure, possible fall. History of GERD, hypertension, COPD. EXAM: PORTABLE CHEST 1 VIEW COMPARISON:  Chest x-ray dated 11/02/2016. FINDINGS: LEFT chest  wall pacemaker leads appear stable in position. Heart size and mediastinal contours are stable. Lungs are clear. No pleural effusion or pneumothorax seen. Osseous structures about the chest are unremarkable. IMPRESSION: No active disease. No evidence of pneumonia or pulmonary edema. Electronically Signed   By: Franki Cabot M.D.   On: 04/09/2018 20:36   Dg Hand Complete Right  Result Date: 04/09/2018 CLINICAL DATA:  Hand pain distal ring finger EXAM: RIGHT HAND - COMPLETE 3+ VIEW COMPARISON:  None. FINDINGS: Surgical plate and fixating screws extending from the distal radius to the third metacarpal, traversing the carpal bones. Acute displaced intra-articular fracture involving the volar base of the fourth distal phalanx. Fused appearance of the carpal bones. IMPRESSION: Acute mildly displaced intra-articular fracture involving the dorsal base of the fourth distal phalanx Electronically Signed   By: Donavan Foil M.D.   On: 04/09/2018 21:02    Assessment and Plan:   Syncope, near-syncope -D/t patient dementia, unable to ascertain if syncope with LOC or near syncope and without LOC. H/o orthostatic hypotension. CT head without acute findings. Acute / mild distal  phalanx fracture per ED Xray. --Suspect etiology of syncope/near syncope non-cardiac at this time and more likely due to dehydration, electrolyte abnormality, hypotension, and tachycardic rates. WBC elevated and lactic acid consistent with infection. Consider also overmedication.  --Repeat interrogation and echo pending. See previous interrogation in EMR and as above. If interrogation and echo without acute findings at time of event and telemetry without arrhythmia, consider further workup for non-cardiac etiology.  --Continue to hold antihypertensives, diuretics, and AV nodal blocking medications including Coreg 25mg  BID, losartan 25mg  daily, lasix 40mg  BID. Replete K as below. Continue to hold digoxin with elevated Cr. Continue to hold with restart prior to discharge as renal function allows.  Hypokalemia --Nausea while in room but denies h/o emesis or diarrhea.  --K 3.0. Replete with goal 4.0. - Ordered follow-up BMET and Mg check  AKI --Cr 1.37 and elevated with baseline estimated at ~0.9. BUN elevated at 24. --Consider AKI d/t dehydration and pre-renal injury with hypotension. - Continue to hold ARB and diuresis in setting of hypotension and AKI likely d/t dehydration. Caution with nephrotoxins.  --Daily BMET  Anemia -Appears chronic on review of labs. Hemoglobin 10.3.  Previous admission with hemoglobin also in this range.  Continue to monitor with daily CBC.  Transfusion recommended for hemoglobin under 8.0.  Dementia --At baseline per family. Not able to ascertain if lives alone or how medications managed. As above, could consider possible over-diuresis d/t taking too much lasix given hypokalemia and hypotension / AKI consistent with dehydration. Will attempt to reach family for further information.    For questions or updates, please contact [place] Please consult [email or web address] for contact info under [facility] Card[facility]  Signed, Arvil Chaco, PA-C  04/10/2018 9:47 AM     I have seen and examined the patient and agree with the findings and plan, as documented in the PA/NP's note, with the following additions/changes.  Briefly, Ms. Bernard is a 68 year old woman followed by Dr. Caryl Comes, presenting after fall.  Circumstances are unclear, as the patient knows only that she passed out.  Her family members are not available in the hospital or by telephone to ascertain further details.  Per the chart, she had an episode of lightheadedness and may have briefly passed out.  She believes it was only for a few minutes.  She had fallen earlier in the week and injured 1 of her fingers.  She  denies preceding chest pain, shortness of breath, palpitations, or lightheadedness.  She reports some subjective fevers over the last few days.  Upon arrival in the ED, she was noted to be hypotensive which is intermittently persisted.  She has also had periods of tachycardia.  At this time, she is asymptomatic.  Physical exam is notable for an obese woman lying in bed.  Heart sounds are regular without murmurs.  Breath sounds are mildly diminished throughout.  Abdomen is soft and nontender.  There is no lower extremity edema or JVD.  Labs demonstrate sodium 136, potassium 3.0, BUN 24, creatinine 1.4, magnesium 1.1, white blood cell count 11, hemoglobin 10.3, and platelets 239.  Troponin negative x1.  EKG not available for review.  Telemetry personally reviewed, demonstrating sinus rhythm and sinus tachycardia with predominantly ventricular pacing.  There is some periods of non-paced beats when the sinus rate exceeds 130 bpm.  Echocardiogram (personally reviewed) is technically difficult but suggests LVEF of around 40 to 45%.  Device interrogation earlier this week was notable for a single episode of likely atrial tachycardia.  It is unclear if this interrogation preceded her syncopal event leading to hospitalization, as the patient is unclear about timing of all of these events.  In summary, Ms.  Colquitt is a 68 year old woman whom we have been asked to see due to syncope.  Unfortunately, history is limited.  Given her tachycardia, which appears sinus, and hypotension, I am concerned for some element of dehydration.  Her LVEF is moderately reduced by echo, though I think she could certainly tolerate gentle hydration.  We have asked [employer] to formally interrogate her device today to see if any significant arrhythmias occurred to explain her syncope.  It would be helpful to obtain additional information from her family, when they are available.  In the setting of hypotension, I think it is worthwhile to hold carvedilol and losartan.  I also think it is worthwhile to hold digoxin in the setting of acute kidney injury.  Electrolytes should be repeated for goal potassium and magnesium greater than 4.0 and 2.0, respectively.  Unless patient has severe GERD, discontinuation of pantoprazole should be considered, as this can lead to hypomagnesemia.  Nelva Bush, MD [facility] Pager: [phone]

## 2018-04-10 NOTE — Progress Notes (Signed)
[facility] [place] NAME: Stacy Grimes    MR#:  [id number]  DATE OF BIRTH:  1950/02/05  SUBJECTIVE: Admitted for recurrent syncope, patient had a fall at home yesterday and also had hypotension.  notedTo have orthostatic hypotension.  CHIEF COMPLAINT:   Chief Complaint  Patient presents with  . Hypotension  . Fall  Denies any complaints this morning.  REVIEW OF SYSTEMS:   ROS CONSTITUTIONAL: No fever, fatigue or weakness.  EYES: No blurred or double vision.  EARS, NOSE, AND THROAT: No tinnitus or ear pain.  RESPIRATORY: No cough, shortness of breath, wheezing or hemoptysis.  CARDIOVASCULAR: No chest pain, orthopnea, edema.  GASTROINTESTINAL: No nausea, vomiting, diarrhea or abdominal pain.  GENITOURINARY: No dysuria, hematuria.  ENDOCRINE: No polyuria, nocturia,  HEMATOLOGY: No anemia, easy bruising or bleeding SKIN: No rash or lesion. MUSCULOSKELETAL: No joint pain or arthritis.   NEUROLOGIC: No tingling, numbness, weakness.  PSYCHIATRY: No anxiety or depression.   DRUG ALLERGIES:   Allergies  Allergen Reactions  . Codeine Palpitations  . Contrast Media [Iodinated Diagnostic Agents] Rash    VITALS:  Blood pressure (!) 80/52, pulse (!) 127, temperature 98.6 F (37 C), temperature source Oral, resp. rate 20, height 5\' 3"  (1.6 m), weight 80.2 kg, SpO2 98 %.  PHYSICAL EXAMINATION:  GENERAL:  68 y.o.-year-old patient lying in the bed with no acute distress.  EYES: Pupils equal, round, reactive to light and accommodation. No scleral icterus. Extraocular muscles intact.  HEENT: Head atraumatic, normocephalic. Oropharynx and nasopharynx clear.  NECK:  Supple, no jugular venous distention. No thyroid enlargement, no tenderness.  LUNGS: Normal breath sounds bilaterally, no wheezing, rales,rhonchi or crepitation. No use of accessory muscles of respiration.  CARDIOVASCULAR: S1, S2 normal. No murmurs, rubs, or gallops.   ABDOMEN: Soft, nontender, nondistended. Bowel sounds present. No organomegaly or mass.  EXTREMITIES: No pedal edema, cyanosis, or clubbing.  NEUROLOGIC: Cranial nerves II through XII are intact. Muscle strength 5/5 in all extremities. Sensation intact. Gait not checked.  PSYCHIATRIC: The patient is alert and oriented x 3.  SKIN: No obvious rash, lesion, or ulcer.    LABORATORY PANEL:   CBC Recent Labs  Lab 04/10/18 0104  WBC 11.0*  HGB 10.3*  HCT 32.5*  PLT 239   ------------------------------------------------------------------------------------------------------------------  Chemistries  Recent Labs  Lab 04/09/18 1959 04/10/18 0104  NA 134* 136  K 3.4* 3.0*  CL 90* 91*  CO2 32 33*  GLUCOSE 139* 137*  BUN 27* 24*  CREATININE 1.44* 1.37*  CALCIUM 8.7* 8.5*  AST 17  --   ALT 12  --   ALKPHOS 109  --   BILITOT 0.4  --    ------------------------------------------------------------------------------------------------------------------  Cardiac Enzymes Recent Labs  Lab 04/09/18 1959  TROPONINI <0.03   ------------------------------------------------------------------------------------------------------------------  RADIOLOGY:  Ct Head Wo Contrast  Result Date: 04/09/2018 CLINICAL DATA:  Reported fall. Low blood pressure. EXAM: CT HEAD WITHOUT CONTRAST TECHNIQUE: Contiguous axial images were obtained from the base of the skull through the vertex without intravenous contrast. COMPARISON:  CT 02/20/2015 FINDINGS: Brain: Brain volume is normal for age. Mild chronic small vessel ischemia. No intracranial hemorrhage, mass effect, or midline shift. No hydrocephalus. The basilar cisterns are patent. No evidence of territorial infarct or acute ischemia. No extra-axial or intracranial fluid collection. Vascular: No hyperdense vessel. Skull: No fracture or focal lesion. Sinuses/Orbits: Bilateral lens extraction. Small fluid level in left side of sphenoid sinus, remaining  paranasal sinuses are clear. The  mastoid air cells are well-aerated. Other: None. IMPRESSION: No acute intracranial abnormality. No skull fracture. Electronically Signed   By: Keith Rake M.D.   On: 04/09/2018 21:01   Dg Chest Port 1 View  Result Date: 04/09/2018 CLINICAL DATA:  Low blood pressure, possible fall. History of GERD, hypertension, COPD. EXAM: PORTABLE CHEST 1 VIEW COMPARISON:  Chest x-ray dated 11/02/2016. FINDINGS: LEFT chest wall pacemaker leads appear stable in position. Heart size and mediastinal contours are stable. Lungs are clear. No pleural effusion or pneumothorax seen. Osseous structures about the chest are unremarkable. IMPRESSION: No active disease. No evidence of pneumonia or pulmonary edema. Electronically Signed   By: Franki Cabot M.D.   On: 04/09/2018 20:36   Dg Hand Complete Right  Result Date: 04/09/2018 CLINICAL DATA:  Hand pain distal ring finger EXAM: RIGHT HAND - COMPLETE 3+ VIEW COMPARISON:  None. FINDINGS: Surgical plate and fixating screws extending from the distal radius to the third metacarpal, traversing the carpal bones. Acute displaced intra-articular fracture involving the volar base of the fourth distal phalanx. Fused appearance of the carpal bones. IMPRESSION: Acute mildly displaced intra-articular fracture involving the dorsal base of the fourth distal phalanx Electronically Signed   By: Donavan Foil M.D.   On: 04/09/2018 21:02    EKG:   Orders placed or performed during the hospital encounter of 04/09/18  . ED EKG  . ED EKG    ASSESSMENT AND PLAN:   68 year old female patient all of her medical problems of biventricular ICD, COPD, chronic systolic heart failure, nonischemic cardiomyopathy, GERD, hypertension, hypothyroidism, COPD on chronic oxygen 2 L, anxiety, AAA admitted because of double falls, hypotension  Syncope likely secondary to orthostatic hypotension causing multiple falls at home: Hold diuretics, patient was taking Lasix 40 mg  daily at home we held it secondary to multiple falls with hypotension.  Continue to hold reassess need for lower dose at discharge.  Continue to hold heart failure medicines namely Coreg, digoxin, Lasix, losartan at this time due to hypotension.  I will consult [facility].,  Echocardiogram done this morning, follow results.  #2 history of COPD, no wheezing, continue oxygen 2 L, she is chronically on 2 L.  3.History of nonischemic cardiomyopathy, chronic systolic heart failure, with Dr. Caryl Comes, able to give any of heart failure medicines because of hypotension. Consult cardiology while she is in the hospital.  Patient was following with Dr. Fletcher Anon. #4. hypokalemia due to diuretics, replace potassium.  5.  Multiple falls, patient has right fourth finger fracture status post splint present  6.Diabetes mellitus type 2 continue sliding scale insulin with coverage, hold metformin due to renal failure.  7.  Acute renal failure secondary to dehydration with ATN and also due to hypotension: Renal function is better than yesterday, hold metformin.,  Continue sliding scale insulin with coverage.  all the records are reviewed and case discussed with Care Management/Social Workerr. Management plans discussed with the patient, family and they are in agreement.  CODE STATUS: Full code TOTAL TIME TAKING CARE OF THIS PATIENT: 40 minutes.   Than 50% time spent in counseling, coordination of care POSSIBLE D/C IN 1-2 DAYS, DEPENDING ON CLINICAL CONDITION.   Epifanio Lesches M.D on 04/10/2018 at 10:36 AM  Between 7am to 6pm - Pager - [phone]  After 6pm go to [email or web address] - password [email or web address] [facility]  [facility]  Office  [phone]  CC: Primary care physician; Cyndi Bender, PA-C   Note: This dictation was prepared with Dragon dictation along  with smaller phrase technology. Any transcriptional errors that result from this process are unintentional.

## 2018-04-10 NOTE — Progress Notes (Signed)
*  PRELIMINARY RESULTS* Echocardiogram 2D Echocardiogram has been performed.  Stacy Grimes 04/10/2018, 10:43 AM

## 2018-04-10 NOTE — Plan of Care (Signed)
Admitted from home with husband with syncope- right 4th finger in splint from fall.  Pt alert & oriented but has short term memory loss. Telemetry box [id number]. Problem: Pain Managment: Goal: General experience of comfort will improve Outcome: Progressing   Problem: Safety: Goal: Ability to remain free from injury will improve Outcome: Progressing

## 2018-04-10 NOTE — Progress Notes (Signed)
EPIC Encounter for ICM Monitoring  Patient Name: Stacy Grimes is a 68 y.o. female Date: 04/10/2018 Primary Care Physican: Cyndi Bender, PA-C Primary [place] Electrophysiologist: Caryl Comes Bi-V Pacing: 97% Last Weight:200lbs Today's Weight: unknown  Patient currently hospitalized.   Thoracic impedance abnormal suggesting dryness from 03/25/2018 - 04/07/2018 but is currently back at baseline.   Prescribed:Furosemide 40 mg 1 tablet twice a day   Labs: 06/17/2017 Creatinine 1.00, BUN 13, Potassium 5.3, Sodium 142, EGFR 58-67  Recommendations:No changes. Encouraged to call for fluid symptoms.  Follow-up plan: ICM clinic phone appointment on3/16/2020 to recheck fluid levels.   Copy of ICM check sent to [place].    3 month ICM trend: 04/09/2018    1 Year ICM trend:       [name], RN 04/10/2018 4:55 PM

## 2018-04-11 DIAGNOSIS — R55 Syncope and collapse: Secondary | ICD-10-CM

## 2018-04-11 DIAGNOSIS — I420 Dilated cardiomyopathy: Secondary | ICD-10-CM

## 2018-04-11 DIAGNOSIS — I951 Orthostatic hypotension: Secondary | ICD-10-CM

## 2018-04-11 DIAGNOSIS — F0390 Unspecified dementia without behavioral disturbance: Secondary | ICD-10-CM

## 2018-04-11 DIAGNOSIS — I10 Essential (primary) hypertension: Secondary | ICD-10-CM | POA: Diagnosis not present

## 2018-04-11 LAB — URINE CULTURE: Culture: <10000 — AB

## 2018-04-11 LAB — GLUCOSE, CAPILLARY
Glucose-Capillary: 83 mg/dL (ref 70–99)
Glucose-Capillary: 132 mg/dL — ABNORMAL HIGH (ref 70–99)
Glucose-Capillary: 118 mg/dL — ABNORMAL HIGH (ref 70–99)
Glucose-Capillary: 127 mg/dL — ABNORMAL HIGH (ref 70–99)

## 2018-04-11 LAB — CBC WITH DIFFERENTIAL/PLATELET
Abs Immature Granulocytes: 0.03 10*3/uL (ref 0.00–0.07)
Basophils Absolute: 0.1 10*3/uL (ref 0.0–0.1)
Basophils Relative: 1 %
EOS PCT: 3 %
Eosinophils Absolute: 0.2 10*3/uL (ref 0.0–0.5)
HEMATOCRIT: 33.2 % — AB (ref 36.0–46.0)
Hemoglobin: 10.2 g/dL — ABNORMAL LOW (ref 12.0–15.0)
Immature Granulocytes: 0 %
Lymphocytes Relative: 26 %
Lymphs Abs: 2.1 10*3/uL (ref 0.7–4.0)
MCH: 27.8 pg (ref 26.0–34.0)
MCHC: 30.7 g/dL (ref 30.0–36.0)
MCV: 90.5 fL (ref 80.0–100.0)
Monocytes Absolute: 0.9 10*3/uL (ref 0.1–1.0)
Monocytes Relative: 11 %
Neutro Abs: 5 10*3/uL (ref 1.7–7.7)
Neutrophils Relative %: 59 %
PLATELETS: 237 10*3/uL (ref 150–400)
RBC: 3.67 MIL/uL — ABNORMAL LOW (ref 3.87–5.11)
RDW: 15.9 % — ABNORMAL HIGH (ref 11.5–15.5)
WBC: 8.3 10*3/uL (ref 4.0–10.5)
nRBC: 0 % (ref 0.0–0.2)

## 2018-04-11 LAB — BASIC METABOLIC PANEL
Anion gap: 11 (ref 5–15)
BUN: 16 mg/dL (ref 8–23)
CO2: 31 mmol/L (ref 22–32)
CREATININE: 1.17 mg/dL — AB (ref 0.44–1.00)
Calcium: 8.8 mg/dL — ABNORMAL LOW (ref 8.9–10.3)
Chloride: 94 mmol/L — ABNORMAL LOW (ref 98–111)
GFR calc Af Amer: 55 mL/min — ABNORMAL LOW (ref >60)
GFR calc non Af Amer: 48 mL/min — ABNORMAL LOW (ref >60)
Glucose, Bld: 153 mg/dL — ABNORMAL HIGH (ref 70–99)
Potassium: 3.8 mmol/L (ref 3.5–5.1)
Sodium: 136 mmol/L (ref 135–145)

## 2018-04-11 LAB — MAGNESIUM: Magnesium: 2.3 mg/dL (ref 1.7–2.4)

## 2018-04-11 LAB — HIV ANTIBODY (ROUTINE TESTING W REFLEX): HIV Screen 4th Generation wRfx: NONREACTIVE

## 2018-04-11 MED ORDER — MIDODRINE HCL 5 MG PO TABS
2.5000 mg | ORAL_TABLET | Freq: Two times a day (BID) | ORAL | Status: DC
  Administered 2018-04-11: 2.5 mg via ORAL
  Filled 2018-04-11: qty 1

## 2018-04-11 MED ORDER — TRAMADOL HCL 50 MG PO TABS
50.0000 mg | ORAL_TABLET | Freq: Four times a day (QID) | ORAL | Status: DC | PRN
  Administered 2018-04-11: 50 mg via ORAL
  Filled 2018-04-11: qty 1

## 2018-04-11 MED ORDER — SODIUM CHLORIDE 0.9 % IV SOLN
INTRAVENOUS | Status: AC
  Administered 2018-04-11: 13:00:00 via INTRAVENOUS

## 2018-04-11 NOTE — Progress Notes (Signed)
[facility] [place] NAME: Stacy Grimes    MR#:  [id number]  DATE OF BIRTH:  17-Mar-1950  SUBJECTIVE:  CHIEF COMPLAINT:   Chief Complaint  Patient presents with  . Hypotension  . Fall   -Came in with recurrent syncope.  Still feels dizzy on standing.  Orthostatics positive for low blood pressure  REVIEW OF SYSTEMS:  Review of Systems  Constitutional: Negative for chills, fever and malaise/fatigue.  HENT: Negative for ear discharge, hearing loss and nosebleeds.   Eyes: Negative for blurred vision and double vision.  Respiratory: Negative for cough, shortness of breath and wheezing.   Cardiovascular: Negative for chest pain and palpitations.  Gastrointestinal: Negative for abdominal pain, constipation, diarrhea, nausea and vomiting.  Genitourinary: Negative for dysuria.  Neurological: Positive for dizziness. Negative for focal weakness, seizures, weakness and headaches.  Psychiatric/Behavioral: Negative for depression.    DRUG ALLERGIES:   Allergies  Allergen Reactions  . Codeine Palpitations  . Contrast Media [Iodinated Diagnostic Agents] Rash    VITALS:  Blood pressure 139/75, pulse (!) 101, temperature 97.7 F (36.5 C), temperature source Oral, resp. rate 20, height 5\' 3"  (1.6 m), weight 80.5 kg, SpO2 99 %.  PHYSICAL EXAMINATION:  Physical Exam   GENERAL:  68 y.o.-year-old patient lying in the bed with no acute distress.  EYES: Pupils equal, round, reactive to light and accommodation. No scleral icterus. Extraocular muscles intact.  HEENT: Head atraumatic, normocephalic. Oropharynx and nasopharynx clear.  NECK:  Supple, no jugular venous distention. No thyroid enlargement, no tenderness.  LUNGS: Normal breath sounds bilaterally, no wheezing, rales,rhonchi or crepitation. No use of accessory muscles of respiration. Decreased bibasilar breath sounds CARDIOVASCULAR: S1, S2 normal. No  rubs, or gallops. 2/6 systolic murmur  present ABDOMEN: Soft, nontender, nondistended. Bowel sounds present. No organomegaly or mass.  EXTREMITIES: No pedal edema, cyanosis, or clubbing.  NEUROLOGIC: Cranial nerves II through XII are intact. Muscle strength 5/5 in all extremities. Sensation intact. Gait not checked.  PSYCHIATRIC: The patient is alert and oriented x 3.  SKIN: No obvious rash, lesion, or ulcer.    LABORATORY PANEL:   CBC Recent Labs  Lab 04/11/18 0454  WBC 8.3  HGB 10.2*  HCT 33.2*  PLT 237   ------------------------------------------------------------------------------------------------------------------  Chemistries  Recent Labs  Lab 04/09/18 1959  04/11/18 0454  NA 134*   < > 136  K 3.4*   < > 3.8  CL 90*   < > 94*  CO2 32   < > 31  GLUCOSE 139*   < > 153*  BUN 27*   < > 16  CREATININE 1.44*   < > 1.17*  CALCIUM 8.7*   < > 8.8*  MG  --    < > 2.3  AST 17  --   --   ALT 12  --   --   ALKPHOS 109  --   --   BILITOT 0.4  --   --    < > = values in this interval not displayed.   ------------------------------------------------------------------------------------------------------------------  Cardiac Enzymes Recent Labs  Lab 04/09/18 1959  TROPONINI <0.03   ------------------------------------------------------------------------------------------------------------------  RADIOLOGY:  Ct Head Wo Contrast  Result Date: 04/09/2018 CLINICAL DATA:  Reported fall. Low blood pressure. EXAM: CT HEAD WITHOUT CONTRAST TECHNIQUE: Contiguous axial images were obtained from the base of the skull through the vertex without intravenous contrast. COMPARISON:  CT 02/20/2015 FINDINGS: Brain: Brain volume is normal for age. Mild chronic small  vessel ischemia. No intracranial hemorrhage, mass effect, or midline shift. No hydrocephalus. The basilar cisterns are patent. No evidence of territorial infarct or acute ischemia. No extra-axial or intracranial fluid collection. Vascular: No hyperdense vessel. Skull:  No fracture or focal lesion. Sinuses/Orbits: Bilateral lens extraction. Small fluid level in left side of sphenoid sinus, remaining paranasal sinuses are clear. The mastoid air cells are well-aerated. Other: None. IMPRESSION: No acute intracranial abnormality. No skull fracture. Electronically Signed   By: Keith Rake M.D.   On: 04/09/2018 21:01   Dg Chest Port 1 View  Result Date: 04/09/2018 CLINICAL DATA:  Low blood pressure, possible fall. History of GERD, hypertension, COPD. EXAM: PORTABLE CHEST 1 VIEW COMPARISON:  Chest x-ray dated 11/02/2016. FINDINGS: LEFT chest wall pacemaker leads appear stable in position. Heart size and mediastinal contours are stable. Lungs are clear. No pleural effusion or pneumothorax seen. Osseous structures about the chest are unremarkable. IMPRESSION: No active disease. No evidence of pneumonia or pulmonary edema. Electronically Signed   By: Franki Cabot M.D.   On: 04/09/2018 20:36   Dg Hand Complete Right  Result Date: 04/09/2018 CLINICAL DATA:  Hand pain distal ring finger EXAM: RIGHT HAND - COMPLETE 3+ VIEW COMPARISON:  None. FINDINGS: Surgical plate and fixating screws extending from the distal radius to the third metacarpal, traversing the carpal bones. Acute displaced intra-articular fracture involving the volar base of the fourth distal phalanx. Fused appearance of the carpal bones. IMPRESSION: Acute mildly displaced intra-articular fracture involving the dorsal base of the fourth distal phalanx Electronically Signed   By: Donavan Foil M.D.   On: 04/09/2018 21:02    EKG:   Orders placed or performed during the hospital encounter of 04/09/18  . ED EKG  . ED EKG    ASSESSMENT AND PLAN:   68 year old female with past medical history significant for nonischemic cardiomyopathy status post AICD, COPD on chronic 2 L home oxygen, chronic diastolic heart failure, hypertension, hypothyroidism and mild cognitive deficits presents to hospital secondary to  recurrent syncope.  1.  Recurrent syncope-orthostatic hypotension present this time, likely secondary to dehydration -Give IV fluids again today.  Recheck orthostatics in a.m. -Low-dose midodrine started -CT of the head is negative for any acute findings. -Evaluate with physical therapy -Appreciate cardiology consult.  Echocardiogram is done and pending -AICD to be interrogated. -Diuretics and blood pressure meds currently on hold  2.  Acute kidney injury-likely secondary to dehydration.  Receiving IV fluids.  Blood pressure medications on hold.  Avoid nephrotoxins. -Recheck in a.m.  3.  Anemia of chronic disease-no active bleeding, hemoglobin stable at 10.  4.  Hypothyroidism-continue Synthroid  5.  DVT prophylaxis-on Lovenox  Physical therapy consult requested.  Anticipate discharge tomorrow if blood pressure is improving     All the records are reviewed and case discussed with Care Management/Social Workerr. Management plans discussed with the patient, family and they are in agreement.  CODE STATUS: Full Code  TOTAL TIME TAKING CARE OF THIS PATIENT: 38 minutes.   POSSIBLE D/C IN 1-2 DAYS, DEPENDING ON CLINICAL CONDITION.   Gladstone Lighter M.D on 04/11/2018 at 11:43 AM  Between 7am to 6pm - Pager - [phone]  After 6pm go to [email or web address] - password [email or web address] [facility]  Office  [phone]  CC: Primary care physician; Cyndi Bender, PA-C

## 2018-04-11 NOTE — Progress Notes (Signed)
Progress Note  Patient Name: Stacy Grimes Date of Encounter: 04/11/2018  Primary Cardiologist: Jolyn Nap, [place]  Subjective  Long discussion with husband at the bedside who is a better historian Not feeling well last 3 weeks or so, not eating very well, 2 mouthfuls at the time In general poor fluid intake Denied any cough or upper respiratory infection symptoms Has been losing weight even not eating much  Inpatient Medications    Scheduled Meds: . aspirin EC  81 mg Oral Daily  . enoxaparin (LOVENOX) injection  40 mg Subcutaneous Q24H  . insulin aspart  0-9 Units Subcutaneous TID WC  . levothyroxine  112 mcg Oral QAC breakfast  . mouth rinse  15 mL Mouth Rinse BID  . midodrine  2.5 mg Oral BID WC  . pantoprazole  40 mg Oral Daily   Continuous Infusions: . sodium chloride 75 mL/hr at 04/11/18 1230   PRN Meds: acetaminophen **OR** acetaminophen, ondansetron **OR** ondansetron (ZOFRAN) IV, tiotropium   Vital Signs    Vitals:   04/11/18 0752 04/11/18 1000 04/11/18 1002 04/11/18 1004  BP: 130/73 100/76 (!) 147/77 139/75  Pulse: 97 (!) 139 (!) 109 (!) 101  Resp: 19 18  20   Temp: 97.7 F (36.5 C)     TempSrc: Oral     SpO2: 99% 100% 99% 99%  Weight:      Height:        Intake/Output Summary (Last 24 hours) at 04/11/2018 1429 Last data filed at 04/11/2018 1351 Gross per 24 hour  Intake 843.71 ml  Output 550 ml  Net 293.71 ml   Last 3 Weights 04/11/2018 04/10/2018 04/09/2018  Weight (lbs) 177 lb 8 oz 176 lb 14.4 oz 230 lb  Weight (kg) 80.513 kg 80.241 kg 104.327 kg      Telemetry    Normal sinus rhythm- Personally Reviewed  ECG     - Personally Reviewed  Physical Exam   GEN: No acute distress.   Neck: No JVD Cardiac: RRR, no murmurs, rubs, or gallops.  Respiratory: Clear to auscultation bilaterally. GI: Soft, nontender, non-distended  MS: No edema; No deformity. Neuro:  Nonfocal  Psych: Normal affect   Labs    Chemistry Recent Labs  Lab  04/09/18 1959 04/10/18 0104 04/11/18 0454  NA 134* 136 136  K 3.4* 3.0* 3.8  CL 90* 91* 94*  CO2 32 33* 31  GLUCOSE 139* 137* 153*  BUN 27* 24* 16  CREATININE 1.44* 1.37* 1.17*  CALCIUM 8.7* 8.5* 8.8*  PROT 7.5  --   --   ALBUMIN 3.4*  --   --   AST 17  --   --   ALT 12  --   --   ALKPHOS 109  --   --   BILITOT 0.4  --   --   GFRNONAA 37* 40* 48*  GFRAA 43* 46* 55*  ANIONGAP 12 12 11      Hematology Recent Labs  Lab 04/09/18 1959 04/10/18 0104 04/11/18 0454  WBC 10.2 11.0* 8.3  RBC 3.78* 3.70* 3.67*  HGB 10.6* 10.3* 10.2*  HCT 33.2* 32.5* 33.2*  MCV 87.8 87.8 90.5  MCH 28.0 27.8 27.8  MCHC 31.9 31.7 30.7  RDW 15.7* 15.6* 15.9*  PLT 246 239 237    Cardiac Enzymes Recent Labs  Lab 04/09/18 1959  TROPONINI <0.03   No results for input(s): TROPIPOC in the last 168 hours.   BNPNo results for input(s): BNP, PROBNP in the last 168 hours.  DDimer No results for input(s): DDIMER in the last 168 hours.   Radiology    Ct Head Wo Contrast  Result Date: 04/09/2018 CLINICAL DATA:  Reported fall. Low blood pressure. EXAM: CT HEAD WITHOUT CONTRAST TECHNIQUE: Contiguous axial images were obtained from the base of the skull through the vertex without intravenous contrast. COMPARISON:  CT 02/20/2015 FINDINGS: Brain: Brain volume is normal for age. Mild chronic small vessel ischemia. No intracranial hemorrhage, mass effect, or midline shift. No hydrocephalus. The basilar cisterns are patent. No evidence of territorial infarct or acute ischemia. No extra-axial or intracranial fluid collection. Vascular: No hyperdense vessel. Skull: No fracture or focal lesion. Sinuses/Orbits: Bilateral lens extraction. Small fluid level in left side of sphenoid sinus, remaining paranasal sinuses are clear. The mastoid air cells are well-aerated. Other: None. IMPRESSION: No acute intracranial abnormality. No skull fracture. Electronically Signed   By: Keith Rake M.D.   On: 04/09/2018 21:01    Dg Chest Port 1 View  Result Date: 04/09/2018 CLINICAL DATA:  Low blood pressure, possible fall. History of GERD, hypertension, COPD. EXAM: PORTABLE CHEST 1 VIEW COMPARISON:  Chest x-ray dated 11/02/2016. FINDINGS: LEFT chest wall pacemaker leads appear stable in position. Heart size and mediastinal contours are stable. Lungs are clear. No pleural effusion or pneumothorax seen. Osseous structures about the chest are unremarkable. IMPRESSION: No active disease. No evidence of pneumonia or pulmonary edema. Electronically Signed   By: Franki Cabot M.D.   On: 04/09/2018 20:36   Dg Hand Complete Right  Result Date: 04/09/2018 CLINICAL DATA:  Hand pain distal ring finger EXAM: RIGHT HAND - COMPLETE 3+ VIEW COMPARISON:  None. FINDINGS: Surgical plate and fixating screws extending from the distal radius to the third metacarpal, traversing the carpal bones. Acute displaced intra-articular fracture involving the volar base of the fourth distal phalanx. Fused appearance of the carpal bones. IMPRESSION: Acute mildly displaced intra-articular fracture involving the dorsal base of the fourth distal phalanx Electronically Signed   By: Donavan Foil M.D.   On: 04/09/2018 21:02    Cardiac Studies  Echocardiogram yesterday  1. Stage 1: 1: Basal and mid anterior septum is abnormal.  2. The left ventricle has mild-moderately reduced systolic function, with an ejection fraction of 40-45%. The cavity size was normal. Left ventricular diastology could not be evaluated.  3. The right ventricle has normal systolic function. The cavity was mildly enlarged. There is mildly increased right ventricular wall thickness. Right ventricular systolic pressure could not be assessed.  Patient Profile     68 y.o. female with a hx of orthostatic hypotension/dizziness, NICM with ICD (Dr. Caryl Comes, [place] with change-out 2018), COPD chronically on O2, chronic HFrEF, HTN, AAA, hypothyroidism, dementia /memory loss, previous smoker with 60  pk yr history, and sleep apnea who is being seen  for the evaluation of syncope, orthostasis  Assessment & Plan    Syncope/near syncope Unclear if dementia is contributing but she has not been eating and drinking well past several weeks at least 3 weeks Husband reports weight loss Given poor fluid intake, poor diet, presenting with dehydration and orthostasis All of her medications were held, she was on high-dose carvedilol, Lasix, and losartan 25 also digoxin -Renal function slowly improving still not at baseline Orthostatics this morning drop of systolic pressure 40 points down to 235 systolic with standing -Agree with 500 cc normal saline over several hours, read check orthostatics -Suspect given her dementia poor fluid and p.o. intake we may not be able to restart  her cardiac medications or doses will be markedly decreased -Would be beneficial to watch overnight given continued orthostasis and symptoms  Acute renal failure Had poor fluid intake as outpatient per the husband, likely dehydration as she was on Lasix 40 twice daily and losartan Continue to hold all medications as above, renal function improving back to baseline  Dementia Likely contributing to anorexia, poor fluid intake  Husband at the bedside, long discussion with him concerning above Discussed management of her medications, discussed impact of poor fluid intake and weight loss on her blood pressure Discussed echocardiogram findings, prior history of cardiomyopathy Discussed timing of when she could go home Case discussed with hospitalist service and with nursing  Total encounter time more than 45 minutes  Greater than 50% was spent in counseling and coordination of care with the patient  For questions or updates, please contact [facility] Please consult [email or web address] for contact info under        Signed, Ida Rogue, MD  04/11/2018, 2:29 PM

## 2018-04-11 NOTE — Progress Notes (Signed)
PT Cancellation Note  Patient Details Name: Stacy Grimes MRN: [id number] DOB: 09/23/1950   Cancelled Treatment:    Reason Eval/Treat Not Completed: Patient not medically ready- per Dr. Tressia Miners, hold until patient received more fluid. Evaluate in am prior to discharge.    [name] 04/11/2018, 12:08 PM

## 2018-04-12 DIAGNOSIS — I10 Essential (primary) hypertension: Secondary | ICD-10-CM | POA: Diagnosis not present

## 2018-04-12 DIAGNOSIS — I420 Dilated cardiomyopathy: Secondary | ICD-10-CM | POA: Diagnosis not present

## 2018-04-12 DIAGNOSIS — E860 Dehydration: Secondary | ICD-10-CM

## 2018-04-12 DIAGNOSIS — I951 Orthostatic hypotension: Secondary | ICD-10-CM | POA: Diagnosis not present

## 2018-04-12 DIAGNOSIS — R55 Syncope and collapse: Secondary | ICD-10-CM | POA: Diagnosis not present

## 2018-04-12 LAB — GLUCOSE, CAPILLARY
Glucose-Capillary: 137 mg/dL — ABNORMAL HIGH (ref 70–99)
Glucose-Capillary: 121 mg/dL — ABNORMAL HIGH (ref 70–99)

## 2018-04-12 LAB — BASIC METABOLIC PANEL
Anion gap: 10 (ref 5–15)
BUN: 10 mg/dL (ref 8–23)
CO2: 32 mmol/L (ref 22–32)
Calcium: 9.5 mg/dL (ref 8.9–10.3)
Chloride: 95 mmol/L — ABNORMAL LOW (ref 98–111)
Creatinine, Ser: 0.97 mg/dL (ref 0.44–1.00)
GFR calc Af Amer: >60 mL/min (ref >60)
GFR calc non Af Amer: >60 mL/min (ref >60)
Glucose, Bld: 159 mg/dL — ABNORMAL HIGH (ref 70–99)
Potassium: 4.1 mmol/L (ref 3.5–5.1)
SODIUM: 137 mmol/L (ref 135–145)

## 2018-04-12 LAB — MAGNESIUM: Magnesium: 2.1 mg/dL (ref 1.7–2.4)

## 2018-04-12 MED ORDER — FUROSEMIDE 20 MG PO TABS: 20.0000 mg | ORAL_TABLET | Freq: Every day | ORAL | 1 refills | Status: DC | PRN

## 2018-04-12 MED ORDER — METOPROLOL TARTRATE 25 MG PO TABS: 25.0000 mg | ORAL_TABLET | Freq: Two times a day (BID) | ORAL | 2 refills | Status: DC

## 2018-04-12 MED ORDER — CARVEDILOL 6.25 MG PO TABS
6.2500 mg | ORAL_TABLET | Freq: Two times a day (BID) | ORAL | Status: DC
  Administered 2018-04-12: 6.25 mg via ORAL
  Filled 2018-04-12: qty 1

## 2018-04-12 MED ORDER — METOPROLOL TARTRATE 25 MG PO TABS: 25.0000 mg | ORAL_TABLET | Freq: Two times a day (BID) | ORAL | Status: DC

## 2018-04-12 MED ORDER — METOPROLOL SUCCINATE ER 25 MG PO TB24: 25.0000 mg | ORAL_TABLET | Freq: Every day | ORAL | 2 refills | Status: DC

## 2018-04-12 MED ORDER — CARVEDILOL 6.25 MG PO TABS: 6.2500 mg | ORAL_TABLET | Freq: Two times a day (BID) | ORAL | 2 refills | Status: DC

## 2018-04-12 MED ORDER — TIOTROPIUM BROMIDE MONOHYDRATE 18 MCG IN CAPS: 18.0000 ug | ORAL_CAPSULE | Freq: Every day | RESPIRATORY_TRACT | 12 refills | Status: DC | PRN

## 2018-04-12 MED ORDER — CARVEDILOL 3.125 MG PO TABS: 3.1250 mg | ORAL_TABLET | Freq: Two times a day (BID) | ORAL | Status: DC

## 2018-04-12 MED ORDER — ALLOPURINOL 300 MG PO TABS: 300.0000 mg | ORAL_TABLET | Freq: Every day | ORAL | Status: DC

## 2018-04-12 MED ORDER — OMEPRAZOLE 20 MG PO CPDR: 20.0000 mg | DELAYED_RELEASE_CAPSULE | Freq: Every morning | ORAL | 2 refills | Status: DC

## 2018-04-12 NOTE — Plan of Care (Signed)
Patient is being discharge home today with home health, discharge instruction provided, iv removed tele removed  Problem: Education: Goal: Knowledge of General Education information will improve Description Including pain rating scale, medication(s)/side effects and non-pharmacologic comfort measures Outcome: Adequate for Discharge   Problem: Safety: Goal: Ability to remain free from injury will improve Outcome: Adequate for Discharge

## 2018-04-12 NOTE — Progress Notes (Signed)
Progress Note  Patient Name: Stacy Grimes Date of Encounter: 04/12/2018  Primary Cardiologist: Jolyn Nap, [facility]  Subjective   Restarted by hospitalist service on Coreg 6.25 mg this morning for tachycardia Orthostatic after taking the medication She has numerous complaints, hot feet, oxygen in her nose, cannot get comfortable in bed She did ambulate with physical therapy with oxygen  Inpatient Medications    Scheduled Meds: . aspirin EC  81 mg Oral Daily  . enoxaparin (LOVENOX) injection  40 mg Subcutaneous Q24H  . insulin aspart  0-9 Units Subcutaneous TID WC  . levothyroxine  112 mcg Oral QAC breakfast  . mouth rinse  15 mL Mouth Rinse BID  . metoprolol tartrate  25 mg Oral BID  . pantoprazole  40 mg Oral Daily   Continuous Infusions:  PRN Meds: acetaminophen **OR** acetaminophen, ondansetron **OR** ondansetron (ZOFRAN) IV, tiotropium, traMADol   Vital Signs    Vitals:   04/11/18 2015 04/12/18 0433 04/12/18 0747 04/12/18 1243  BP: 134/64 138/72 (!) 151/74 126/69  Pulse: (!) 106 (!) 115 (!) 109 100  Resp: 20 20 20    Temp: 98 F (36.7 C) 98 F (36.7 C) 98.3 F (36.8 C)   TempSrc:  Oral Oral   SpO2: 99% 98% 97% 99%  Weight:  80.8 kg    Height:        Intake/Output Summary (Last 24 hours) at 04/12/2018 1920 Last data filed at 04/12/2018 1300 Gross per 24 hour  Intake 240 ml  Output 800 ml  Net -560 ml   Last 3 Weights 04/12/2018 04/11/2018 04/10/2018  Weight (lbs) 178 lb 1.6 oz 177 lb 8 oz 176 lb 14.4 oz  Weight (kg) 80.786 kg 80.513 kg 80.241 kg      Telemetry    Normal sinus rhythm- Personally Reviewed  ECG     - Personally Reviewed  Physical Exam   Constitutional:  oriented to person, No distress.  HENT:  Head: Grossly normal Eyes:  no discharge. No scleral icterus.  Neck: No JVD, no carotid bruits  Cardiovascular: Regular rate and rhythm, no murmurs appreciated Pulmonary/Chest: Clear to auscultation bilaterally, no wheezes or rails Abdominal:  Soft.  no distension.  no tenderness.  Musculoskeletal: Normal range of motion Neurological:  normal muscle tone. Coordination normal. No atrophy Skin: Skin warm and dry Psychiatric: Alert, poor memory, some confusion  Labs    Chemistry Recent Labs  Lab 04/09/18 1959 04/10/18 0104 04/11/18 0454 04/12/18 0446  NA 134* 136 136 137  K 3.4* 3.0* 3.8 4.1  CL 90* 91* 94* 95*  CO2 32 33* 31 32  GLUCOSE 139* 137* 153* 159*  BUN 27* 24* 16 10  CREATININE 1.44* 1.37* 1.17* 0.97  CALCIUM 8.7* 8.5* 8.8* 9.5  PROT 7.5  --   --   --   ALBUMIN 3.4*  --   --   --   AST 17  --   --   --   ALT 12  --   --   --   ALKPHOS 109  --   --   --   BILITOT 0.4  --   --   --   GFRNONAA 37* 40* 48* >60  GFRAA 43* 46* 55* >60  ANIONGAP 12 12 11 10      Hematology Recent Labs  Lab 04/09/18 1959 04/10/18 0104 04/11/18 0454  WBC 10.2 11.0* 8.3  RBC 3.78* 3.70* 3.67*  HGB 10.6* 10.3* 10.2*  HCT 33.2* 32.5* 33.2*  MCV 87.8 87.8  90.5  MCH 28.0 27.8 27.8  MCHC 31.9 31.7 30.7  RDW 15.7* 15.6* 15.9*  PLT 246 239 237    Cardiac Enzymes Recent Labs  Lab 04/09/18 1959  TROPONINI <0.03   No results for input(s): TROPIPOC in the last 168 hours.   BNPNo results for input(s): BNP, PROBNP in the last 168 hours.   DDimer No results for input(s): DDIMER in the last 168 hours.   Radiology    No results found.  Cardiac Studies  Echocardiogram yesterday  1. Stage 1: 1: Basal and mid anterior septum is abnormal.  2. The left ventricle has mild-moderately reduced systolic function, with an ejection fraction of 40-45%. The cavity size was normal. Left ventricular diastology could not be evaluated.  3. The right ventricle has normal systolic function. The cavity was mildly enlarged. There is mildly increased right ventricular wall thickness. Right ventricular systolic pressure could not be assessed.  Patient Profile     68 y.o. female with a hx of orthostatic hypotension/dizziness, NICM with ICD  (Dr. Caryl Comes, [place] with change-out 2018), COPD chronically on O2, chronic HFrEF, HTN, AAA, hypothyroidism, dementia /memory loss, previous smoker with 60 pk yr history, and sleep apnea who is being seen  for the evaluation of syncope, orthostasis  Assessment & Plan    Syncope/near syncope not been eating and drinking well past several weeks at least 3 weeks  weight loss, underlying dementia  presenting with dehydration and orthostasis All of her medications were held this admission On day of discharge even had moderate orthostasis on lower dose carvedilol 6.25 She does have tachycardia We will try metoprolol succinate 25 daily and hold Lasix, hold losartan -Suspect neurocardiogenic issue  Acute renal failure Had poor fluid intake as outpatient per the husband, likely dehydration as she was on Lasix 40 twice daily and losartan -Most of her medications held at discharge  Dementia Likely contributing to anorexia, poor fluid intake   Total encounter time more than 25 minutes  Greater than 50% was spent in counseling and coordination of care with the patient   For questions or updates, please contact [facility] Please consult [email or web address] for contact info under        Signed, Ida Rogue, MD  04/12/2018, 7:20 PM

## 2018-04-12 NOTE — Care Management Note (Signed)
Case Management Note  Patient Details  Name: Stacy Grimes MRN: [id number] Date of Birth: 1950/10/25  Subjective/Objective:   Patient to be discharged per MD order. Orders in place for home health services. Patient is currently active with [facility] health. Notified [place] from [place] of pending discharge. No DME needs. Spouse to transport.                  Action/Plan:   Expected Discharge Date:  04/12/18               Expected Discharge Plan:  [name]  In-House Referral:     Discharge planning Services  CM Consult  Post Acute Care Choice:  Home Health, Resumption of Svcs/PTA Provider Choice offered to:  Patient  DME Arranged:    DME Agency:     HH Arranged:  RN, OT, Nurse's Aide [place] Agency:  [facility]  Status of Service:  Completed, signed off  If discussed at [place] of Stay Meetings, dates discussed:    Additional Comments:  Latanya Maudlin, RN 04/12/2018, 1:26 PM

## 2018-04-12 NOTE — Evaluation (Signed)
Physical Therapy Evaluation Patient Details Name: Stacy Grimes MRN: [id number] DOB: 06-16-1950 Today's Date: 04/12/2018   History of Present Illness  Patient is a 68 year old female who was admitted with syncope due to being hypotensive, fall. Sustained 4th finger fracture on right hand.  PMH to include: pacemaker , CHF, COPD, HF, HTN, O2 dependent  Clinical Impression  Pt is a 68 year old female who lives in a one story home with her husband.  Pt is independent with mobility at baseline.  She was in bed and reporting that she is aching "all over my body" when PT arrived.  PT willing to attempt mobility and able to get to EOB with mod I.  Pt performed STS with CGA and use of RW.  Pt more steady with use of RW at this time.  She was able to ambulate 80 ft with RW and some gait deviations indicative of fall risk.  Pt reported increased pain in LE's and requested to return to bed.  She presented with overall fair strength but MMT was limited by pain.  Pt's vitals remained WNL during transfers.  Pt will continue to benefit from skilled PT with focus on strength, pain management, tolerance to activity, balance and prevention of future falls.    Follow Up Recommendations Home health PT;Supervision - Intermittent    Equipment Recommendations  Rolling walker with 5" wheels    Recommendations for Other Services       Precautions / Restrictions Precautions Precautions: Fall Restrictions Weight Bearing Restrictions: No      Mobility  Bed Mobility Overal bed mobility: Modified Independent             General bed mobility comments: Increased time  Transfers Overall transfer level: Needs assistance Equipment used: Rolling walker (2 wheeled) Transfers: Sit to/from Stand Sit to Stand: Supervision         General transfer comment: Able to stand slowly with VC's for use of RW.  Reported mild light-headedness when first standing which quickly resolved. Vitals  WNL.  Ambulation/Gait Ambulation/Gait assistance: Min guard Gait Distance (Feet): 80 Feet Assistive device: Rolling walker (2 wheeled)     Gait velocity interpretation: 1.31 - 2.62 ft/sec, indicative of limited community ambulator General Gait Details: Low foot clearance and report of increased pain with ambulation.  Stairs            Wheelchair Mobility    Modified Rankin (Stroke Patients Only)       Balance Overall balance assessment: Needs assistance Sitting-balance support: Feet supported Sitting balance-Leahy Scale: Good     Standing balance support: Bilateral upper extremity supported Standing balance-Leahy Scale: Fair                               Pertinent Vitals/Pain      Home Living Family/patient expects to be discharged to:: Private residence Living Arrangements: Spouse/significant other Available Help at Discharge: Family;Available 24 hours/day Type of Home: House Home Access: Stairs to enter Entrance Stairs-Rails: Can reach both Entrance Stairs-Number of Steps: 3 Home Layout: One level Home Equipment: Walker - 2 wheels      Prior Function Level of Independence: Independent         Comments: Pt has not been useing RW at home and reports being mainly a household ambulator     Hand Dominance        Extremity/Trunk Assessment  Communication   Communication: [facility]  Cognition                                              General Comments      Exercises     Assessment/Plan    PT Assessment Patient needs continued PT services  PT Problem List Decreased strength;Decreased mobility;Decreased activity tolerance;Decreased balance;Decreased knowledge of use of DME       PT Treatment Interventions DME instruction;Cognitive remediation;Therapeutic activities;Gait training;Therapeutic exercise;Patient/family education;Stair training;Balance training;Functional mobility  training;Neuromuscular re-education    PT Goals (Current goals can be found in the Care Plan section)  Acute Rehab PT Goals Patient Stated Goal: To return home and to regain strength PT Goal Formulation: With patient Time For Goal Achievement: 04/26/18 Potential to Achieve Goals: Good    Frequency Min 2X/week   Barriers to discharge        Co-evaluation               AM-PAC PT "6 Clicks" Mobility  Outcome Measure Help needed turning from your back to your side while in a flat bed without using bedrails?: None Help needed moving from lying on your back to sitting on the side of a flat bed without using bedrails?: None Help needed moving to and from a bed to a chair (including a wheelchair)?: A Little Help needed standing up from a chair using your arms (e.g., wheelchair or bedside chair)?: A Little Help needed to walk in hospital room?: A Little Help needed climbing 3-5 steps with a railing? : A Lot 6 Click Score: 19    End of Session Equipment Utilized During Treatment: Gait belt Activity Tolerance: Patient limited by pain Patient left: in bed;with bed alarm set;with call bell/phone within reach   PT Visit Diagnosis: Unsteadiness on feet (R26.81);Other abnormalities of gait and mobility (R26.89);Muscle weakness (generalized) (M62.81)    Time: 0569-7948 PT Time Calculation (min) (ACUTE ONLY): 21 min   Charges:   PT Evaluation $PT Eval Low Complexity: 1 Low        Roxanne Gates, PT, DPT   Roxanne Gates 04/12/2018, 3:38 PM

## 2018-04-13 ENCOUNTER — Telehealth: Payer: Self-pay | Admitting: Nurse Practitioner

## 2018-04-13 NOTE — Discharge Summary (Signed)
[place] at [place] NAME: Stacy Grimes    MR#:  [id number]  DATE OF BIRTH:  1950/08/11  DATE OF ADMISSION:  04/09/2018   ADMITTING PHYSICIAN: Lance Coon, MD  DATE OF DISCHARGE: 04/12/2018 12:30 PM  PRIMARY CARE PHYSICIAN: Cyndi Bender, PA-C   ADMISSION DIAGNOSIS:   Syncope [R55] Closed nondisplaced fracture of distal phalanx of right ring finger, initial encounter [S62.664A] Syncope, unspecified syncope type [R55]  DISCHARGE DIAGNOSIS:   Principal Problem:   Syncope Active Problems:   SLEEP APNEA, OBSTRUCTIVE   HYPERTENSION, BENIGN   Dilated cardiomyopathy ([place])   Dehydration   SECONDARY DIAGNOSIS:   Past Medical History:  Diagnosis Date  . AAA (abdominal aortic aneurysm) ([place])   . Anginal pain ([place])   . Anxiety   . Arthritis   . Automatic implantable cardioverter-defibrillator in situ   . Biventricular ICD  [employer]   . CHF (congestive heart failure) ([place])   . Chronic systolic heart failure ([place])   . COPD (chronic obstructive pulmonary disease) ([place])   . Deafness    left ear  . Depression   . Dizziness   . Dysrhythmia   . Fracture    history of spinal fracture  . GERD (gastroesophageal reflux disease)   . Hypertension   . Hypothyroidism   . Non-ischemic cardiomyopathy ([place])   . Oxygen dependent   . Palpitations   . Shortness of breath dyspnea   . Sleep apnea   . Wheezing     HOSPITAL COURSE:   68 year old female with past medical history significant for nonischemic cardiomyopathy status post AICD, COPD on chronic 2 L home oxygen, chronic diastolic heart failure, hypertension, hypothyroidism and mild cognitive deficits presents to hospital secondary to recurrent syncope.  1.  Recurrent syncope-orthostatic hypotension present this time, likely secondary to dehydration -Has referred orthostatic hypotension in the hospital.  Received small fluid bolus with improvement -Discussed with cardiology, their  consult is appreciated.  Midodrine has been discontinued. -Blood pressure medications discontinued -Low-dose Toprol started for tachycardia. -CT of the head is negative for any acute findings. -Evaluate with physical therapy-ambulated close to baseline.  Will need home health at discharge -Likely cause of syncope is neurocardiogenic -ICD interrogated with no significant arrhythmias. -Echocardiogram has been done with EF 40 to 45%, moderately reduced systolic function of left ventricle  2.  Acute kidney injury-likely secondary to dehydration.  Received IV fluids.   Avoid nephrotoxins. -Improved renal function  3.  Anemia of chronic disease-no active bleeding, hemoglobin stable at 10.  4.  Hypothyroidism-continue Synthroid  5.  Dementia-pleasantly confused.  Close to baseline  Patient will be discharged home today with home health services   DISCHARGE CONDITIONS:   Guarded  CONSULTS OBTAINED:   Treatment Team:  Nelva Bush, MD  DRUG ALLERGIES:   Allergies  Allergen Reactions  . Codeine Palpitations  . Contrast Media [Iodinated Diagnostic Agents] Rash   DISCHARGE MEDICATIONS:   Allergies as of 04/12/2018      Reactions   Codeine Palpitations   Contrast Media [iodinated Diagnostic Agents] Rash      Medication List    STOP taking these medications   carvedilol 25 MG tablet Commonly known as:  COREG   diphenhydrAMINE 25 MG tablet Commonly known as:  BENADRYL   doxycycline 100 MG capsule Commonly known as:  VIBRAMYCIN   hydrOXYzine 25 MG capsule Commonly known as:  VISTARIL   losartan 50 MG tablet Commonly known as:  COZAAR  TAKE these medications   albuterol 108 (90 Base) MCG/ACT inhaler Commonly known as:  PROVENTIL HFA;VENTOLIN HFA Inhale 2 puffs into the lungs every 6 (six) hours as needed.   allopurinol 300 MG tablet Commonly known as:  ZYLOPRIM Take 1 tablet (300 mg total) by mouth daily. What changed:  when to take this   aspirin 81  MG EC tablet Take 81 mg by mouth daily.   BENGAY EX Apply 1 application topically daily as needed (back pain).   digoxin 0.125 MG tablet Commonly known as:  LANOXIN Take 1 tablet by mouth every 3rd day What changed:    how much to take  how to take this  when to take this  additional instructions   EX-LAX PO Take 2 tablets by mouth at bedtime as needed (constipation).   furosemide 20 MG tablet Commonly known as:  Lasix Take 1 tablet (20 mg total) by mouth daily as needed for up to 30 days for fluid or edema. What changed:    medication strength  how much to take  when to take this  reasons to take this   LAMISIL SPRAY EX Apply 1 spray topically daily as needed (foot itching).   levothyroxine 112 MCG tablet Commonly known as:  SYNTHROID, LEVOTHROID Take 112 mcg by mouth daily.   Melatonin 10 MG Tabs Take 10 mg by mouth at bedtime as needed (sleep).   metFORMIN 500 MG 24 hr tablet Commonly known as:  GLUCOPHAGE-XR Take 500 mg by mouth 2 (two) times daily.   metoprolol succinate 25 MG 24 hr tablet Commonly known as:  Toprol XL Take 1 tablet (25 mg total) by mouth daily.   omeprazole 20 MG capsule Commonly known as:  PRILOSEC Take 1 capsule (20 mg total) by mouth every morning.   tiotropium 18 MCG inhalation capsule Commonly known as:  SPIRIVA Place 1 capsule (18 mcg total) into inhaler and inhale daily as needed (shortness of breath).   venlafaxine 75 MG tablet Commonly known as:  EFFEXOR Take 75-150 mg by mouth See admin instructions. Take 2 tablets (150mg ) by mouth every morning and 1 tablet (75mg ) by mouth every evening   VISINE OP Apply 1 drop to eye daily as needed (dry eyes).   vitamin B-12 1000 MCG tablet Commonly known as:  CYANOCOBALAMIN Take 1,000 mcg by mouth 2 (two) times daily.        DISCHARGE INSTRUCTIONS:   1.  PCP follow-up in 1 to 2 weeks 2.  Cardiology follow-up in 2 weeks  DIET:   Cardiac diet  ACTIVITY:    Activity as tolerated  OXYGEN:   Home Oxygen: Yes.    Oxygen Delivery: 2 liters/min via Patient connected to nasal cannula oxygen  DISCHARGE LOCATION:   home   If you experience worsening of your admission symptoms, develop shortness of breath, life threatening emergency, suicidal or homicidal thoughts you must seek medical attention immediately by calling 911 or calling your MD immediately  if symptoms less severe.  You Must read complete instructions/literature along with all the possible adverse reactions/side effects for all the Medicines you take and that have been prescribed to you. Take any new Medicines after you have completely understood and accpet all the possible adverse reactions/side effects.   Please note  You were cared for by a hospitalist during your hospital stay. If you have any questions about your discharge medications or the care you received while you were in the hospital after you are discharged, you can call  the unit and asked to speak with the hospitalist on call if the hospitalist that took care of you is not available. Once you are discharged, your primary care physician will handle any further medical issues. Please note that NO REFILLS for any discharge medications will be authorized once you are discharged, as it is imperative that you return to your primary care physician (or establish a relationship with a primary care physician if you do not have one) for your aftercare needs so that they can reassess your need for medications and monitor your lab values.    On the day of Discharge:  VITAL SIGNS:   Blood pressure 126/69, pulse 100, temperature 98.3 F (36.8 C), temperature source Oral, resp. rate 20, height 5\' 3"  (1.6 m), weight 80.8 kg, SpO2 99 %.  PHYSICAL EXAMINATION:   GENERAL:  68 y.o.-year-old patient lying in the bed with no acute distress.  EYES: Pupils equal, round, reactive to light and accommodation. No scleral icterus. Extraocular  muscles intact.  HEENT: Head atraumatic, normocephalic. Oropharynx and nasopharynx clear.  NECK:  Supple, no jugular venous distention. No thyroid enlargement, no tenderness.  LUNGS: Normal breath sounds bilaterally, no wheezing, rales,rhonchi or crepitation. No use of accessory muscles of respiration. Decreased bibasilar breath sounds CARDIOVASCULAR: S1, S2 normal. No  rubs, or gallops. 2/6 systolic murmur present ABDOMEN: Soft, nontender, nondistended. Bowel sounds present. No organomegaly or mass.  EXTREMITIES: No pedal edema, cyanosis, or clubbing.  NEUROLOGIC: Cranial nerves II through XII are intact. Muscle strength 5/5 in all extremities. Sensation intact. Gait not checked.  PSYCHIATRIC: The patient is alert and oriented x 2.  Cognitive deficits noted SKIN: No obvious rash, lesion, or ulcer. Marland Kitchen   DATA REVIEW:   CBC Recent Labs  Lab 04/11/18 0454  WBC 8.3  HGB 10.2*  HCT 33.2*  PLT 237    Chemistries  Recent Labs  Lab 04/09/18 1959  04/12/18 0446  NA 134*   < > 137  K 3.4*   < > 4.1  CL 90*   < > 95*  CO2 32   < > 32  GLUCOSE 139*   < > 159*  BUN 27*   < > 10  CREATININE 1.44*   < > 0.97  CALCIUM 8.7*   < > 9.5  MG  --    < > 2.1  AST 17  --   --   ALT 12  --   --   ALKPHOS 109  --   --   BILITOT 0.4  --   --    < > = values in this interval not displayed.     Microbiology Results  Results for orders placed or performed during the hospital encounter of 04/09/18  Urine culture     Status: Abnormal   Collection Time: 04/09/18  9:25 PM  Result Value Ref Range Status   Specimen Description   Final    URINE, RANDOM Performed at [facility], [address], [place], [place] [address]    Special Requests   Final    NONE Performed at [facility], [address] [place], [place] [address]    Culture (A)  Final    <10,000 COLONIES/mL INSIGNIFICANT GROWTH Performed at [facility], [place] [address], [place], [place] [address]     Report Status 04/11/2018 FINAL  Final    RADIOLOGY:  No results found.   Management plans discussed with the patient, family and they are in agreement.  CODE STATUS:  Code Status History    Date Active Date Inactive Code Status Order ID Comments User Context   04/10/2018 0029 04/12/2018 1924 Full Code [id number]  Lance Coon, MD Inpatient   09/17/2016 1152 09/17/2016 1631 Full Code [id number]  Deboraha Sprang, MD Inpatient   03/28/2012 2328 03/30/2012 1950 Full Code [id number]  Shanda Howells, MD Inpatient      TOTAL TIME TAKING CARE OF THIS PATIENT: 38 minutes.    Gladstone Lighter M.D on 04/13/2018 at 2:49 PM  Between 7am to 6pm - Pager - [phone]  After 6pm go to [email or web address] - password [email or web address] [facility]  [facility]  Office  [phone]  CC: Primary care physician; Cyndi Bender, PA-C   Note: This dictation was prepared with Dragon dictation along with smaller phrase technology. Any transcriptional errors that result from this process are unintentional.

## 2018-04-13 NOTE — Telephone Encounter (Signed)
Patient contacted regarding discharge from [facility] on 04/12/18.  Patient understands to follow up with provider Ignacia Bayley, NP on 04/21/18 at 8:30am at [facility]. Patient understands discharge instructions? yes Patient understands medications and regiment? yes Patient understands to bring all medications to this visit? Yes  DPR on file. Spoke with the patient's husband Stacy Grimes. I asked to speak with the patient directly. Stacy Grimes states that the patient has memory deficit and request that I talk with him. Stacy Grimes sts that the pt is doing ok. She does have some dizziness when ambulating, but is otherwise doing ok. He has no questions or concerns at this time. He knows to contact the office if questions or concerns arise.

## 2018-04-13 NOTE — Telephone Encounter (Signed)
TCM....  Patient is being discharged   They saw Dr. Rockey Situ  They are scheduled to see Murray Hodgkins, NP on 04/21/18 They were seen for syncopy  They need to be seen within 7-10 days    Please call

## 2018-04-17 ENCOUNTER — Encounter: Payer: Self-pay | Admitting: Cardiology

## 2018-04-17 DIAGNOSIS — I110 Hypertensive heart disease with heart failure: Secondary | ICD-10-CM | POA: Diagnosis not present

## 2018-04-17 DIAGNOSIS — S62634D Displaced fracture of distal phalanx of right ring finger, subsequent encounter for fracture with routine healing: Secondary | ICD-10-CM | POA: Diagnosis not present

## 2018-04-17 DIAGNOSIS — I5042 Chronic combined systolic (congestive) and diastolic (congestive) heart failure: Secondary | ICD-10-CM | POA: Diagnosis not present

## 2018-04-17 DIAGNOSIS — J449 Chronic obstructive pulmonary disease, unspecified: Secondary | ICD-10-CM | POA: Diagnosis not present

## 2018-04-17 DIAGNOSIS — M1990 Unspecified osteoarthritis, unspecified site: Secondary | ICD-10-CM | POA: Diagnosis not present

## 2018-04-17 DIAGNOSIS — S8001XD Contusion of right knee, subsequent encounter: Secondary | ICD-10-CM | POA: Diagnosis not present

## 2018-04-17 NOTE — Progress Notes (Signed)
Remote ICD transmission.   

## 2018-04-20 ENCOUNTER — Ambulatory Visit (INDEPENDENT_AMBULATORY_CARE_PROVIDER_SITE_OTHER): Payer: Medicare Other

## 2018-04-20 ENCOUNTER — Other Ambulatory Visit: Payer: Self-pay

## 2018-04-20 DIAGNOSIS — Z95810 Presence of automatic (implantable) cardiac defibrillator: Secondary | ICD-10-CM

## 2018-04-20 DIAGNOSIS — I5022 Chronic systolic (congestive) heart failure: Secondary | ICD-10-CM

## 2018-04-20 NOTE — Progress Notes (Signed)
EPIC Encounter for ICM Monitoring  Patient Name: Stacy Grimes is a 68 y.o. female Date: 04/20/2018 Primary Care Physican: Cyndi Bender, PA-C Primary [place] Electrophysiologist: Caryl Comes Bi-V Pacing: 97% Last Weight:200lbs Today's Weight: unknown  Spoke with husband.  Pt asymptomatic.  Patient hospitalized 04/09/2018 - 04/12/2018.  He reported Furosemide was held at time of hospital discharge and has not taken any dosages since 04/12/2018.  He said there are several meds that were stopped or added that needs to be reviewed at tomorrow's appt   Thoracic impedance abnormal suggesting fluid accumulation since 04/11/2018.   Prescribed:Furosemide 40 mg 1 tablet twice a day - This was the dosage prior to hospitalization.  Labs: 06/17/2017 Creatinine 1.00, BUN 13, Potassium 5.3, Sodium 142, EGFR 58-67  Recommendations:Advised recommendations will be given at tomorrow's appointment.  Follow-up plan: ICM clinic phone appointment on3/24/2020 to recheck fluid levels.    Office appt 04/21/2018 with Ignacia Bayley, NP.    Copy of ICM check sent to Dr. Caryl Comes and Ignacia Bayley, NP.   3 month ICM trend: 04/20/2018    1 Year ICM trend:       [name], RN 04/20/2018 10:52 AM

## 2018-04-21 ENCOUNTER — Other Ambulatory Visit: Payer: Self-pay

## 2018-04-21 ENCOUNTER — Encounter: Payer: Self-pay | Admitting: Nurse Practitioner

## 2018-04-21 ENCOUNTER — Ambulatory Visit (INDEPENDENT_AMBULATORY_CARE_PROVIDER_SITE_OTHER): Payer: Medicare Other | Admitting: Nurse Practitioner

## 2018-04-21 ENCOUNTER — Telehealth: Payer: Self-pay | Admitting: Cardiovascular Disease

## 2018-04-21 VITALS — BP 124/80 | HR 123 | Temp 97.2°F | Ht 63.0 in | Wt 183.5 lb

## 2018-04-21 DIAGNOSIS — R61 Generalized hyperhidrosis: Secondary | ICD-10-CM | POA: Diagnosis not present

## 2018-04-21 DIAGNOSIS — I951 Orthostatic hypotension: Secondary | ICD-10-CM | POA: Diagnosis not present

## 2018-04-21 DIAGNOSIS — R000 Tachycardia, unspecified: Secondary | ICD-10-CM

## 2018-04-21 DIAGNOSIS — I5023 Acute on chronic systolic (congestive) heart failure: Secondary | ICD-10-CM

## 2018-04-21 DIAGNOSIS — I428 Other cardiomyopathies: Secondary | ICD-10-CM | POA: Diagnosis not present

## 2018-04-21 NOTE — Patient Instructions (Addendum)
  Medication Instructions:  Your physician has recommended you make the following change in your medication:  1- STOP COREG  2- CHANGE Lasix Take 2 tablets (40 mg) for 3 days, then resume 1 tablet (20 mg) daily.   If you need a refill on your cardiac medications before your next appointment, please call your pharmacy.   Lab work: None ordered  If you have labs (blood work) drawn today and your tests are completely normal, you will receive your results only by: Marland Kitchen MyChart Message (if you have MyChart) OR . A paper copy in the mail If you have any lab test that is abnormal or we need to change your treatment, we will call you to review the results.  Testing/Procedures: None ordered   Follow-Up: At [facility], you and your health needs are our priority.  As part of our continuing mission to provide you with exceptional heart care, we have created designated Provider Care Teams.  These Care Teams include your primary Cardiologist (physician) and Advanced Practice Providers (APPs -  Physician Assistants and Nurse Practitioners) who all work together to provide you with the care you need, when you need it. You will need a follow up appointment on 04/27/18 @ 3:30PM with Ignacia Bayley, NP

## 2018-04-21 NOTE — Progress Notes (Signed)
Office Visit    Patient Name: Stacy Grimes Date of Encounter: 04/21/2018  Primary Care Provider:  Cyndi Bender, PA-C Primary Cardiologist:  Virl Axe, MD  Chief Complaint    68 year old female with history of orthostatic hypotension, ICM with ICD (Dr. Caryl Comes, [facility], generator change out 2018), COPD on chronic 3 to 4 L home O2, chronic HFrEF, hypertension, AAA, hypothyroidism, mild dementia, remote history of smoking with 60-pack-year history, and sleep apnea who presents for follow-up after recent hospitalization for syncope from 04/09/2018 - 04/12/2018.  Past Medical History    Past Medical History:  Diagnosis Date   AAA (abdominal aortic aneurysm) (HCC)    Anginal pain ([place])    Anxiety    Arthritis    Automatic implantable cardioverter-defibrillator in situ    Biventricular ICD  [facility]    COPD (chronic obstructive pulmonary disease) (HCC)    Deafness    left ear   Dementia (HCC)    Depression    Dizziness    Dysrhythmia    Fracture    history of spinal fracture   GERD (gastroesophageal reflux disease)    HFrEF (heart failure with reduced ejection fraction) ([place])    a. 2010 Cath: nonobs dzs, EF 15-20%; b. 09/2017 Echo: EF suboptimal. EF low nl; c. 04/2018 Echo: EF 40-45%.   Hypertension    Hypothyroidism    Non-ischemic cardiomyopathy ([place])    a. 2010 Cath: nonobs dzs, EF 15-20%; b. 2011 s/p SJM CRT-D; c. 09/2016 s/p Gen change; d. 09/2017 Echo: EF suboptimal. EF low nl; e. 04/2018 Echo: EF 40-45%.   Oxygen dependent    Palpitations    Shortness of breath dyspnea    Sleep apnea    Wheezing    Past Surgical History:  Procedure Laterality Date   ABDOMINAL HYSTERECTOMY     BIV ICD GENERATOR CHANGEOUT N/A 09/17/2016   Procedure: BiV ICD Generator Changeout;  Surgeon: Deboraha Sprang, MD;  Location: [place] [facility];  Service: Cardiovascular;  Laterality: N/A;   BLADDER SURGERY     CARDIAC CATHETERIZATION     CARDIAC DEFIBRILLATOR  PLACEMENT  4 yrs ago   pacemaker   CATARACT EXTRACTION W/PHACO Left 09/01/2014   Procedure: CATARACT EXTRACTION PHACO AND INTRAOCULAR LENS PLACEMENT ([place]);  Surgeon: Lyla Glassing, MD;  Location: [facility] ORS;  Service: Ophthalmology;  Laterality: Left;  [place]: 1:12.1    CATARACT EXTRACTION W/PHACO Right 09/29/2014   Procedure: CATARACT EXTRACTION PHACO AND INTRAOCULAR LENS PLACEMENT (IOC);  Surgeon: Lyla Glassing, MD;  Location: [facility] ORS;  Service: Ophthalmology;  Laterality: Right;  [place]: 00:52.7    CHOLECYSTECTOMY     COLON SURGERY     COLONOSCOPY     COLONOSCOPY N/A 08/31/2012   Procedure: COLONOSCOPY;  Surgeon: Inda Castle, MD;  Location: [facility];  Service: Endoscopy;  Laterality: N/A;   ESOPHAGOGASTRODUODENOSCOPY N/A 07/06/2012   Procedure: ESOPHAGOGASTRODUODENOSCOPY (EGD);  Surgeon: Lafayette Dragon, MD;  Location: [facility];  Service: Endoscopy;  Laterality: N/A;   KNEE SURGERY Right    MIDDLE EAR SURGERY     SAVORY DILATION N/A 07/06/2012   Procedure: SAVORY DILATION;  Surgeon: Lafayette Dragon, MD;  Location: [facility];  Service: Endoscopy;  Laterality: N/A;   WRIST SURGERY Right     Allergies  Allergies  Allergen Reactions   Codeine Palpitations   Contrast Media [Iodinated Diagnostic Agents] Rash    History of Present Illness    68 year old female with the above past medical history.  Of note, she has a documented history of orthostatic hypotension and presyncope and is also noted to be chronically short of breath with dyspnea on exertion on chronic home oxygen.  She has known and NICM that dates back to cardiac catheterization in 2010, which showed an EF of 15 -20% without obstructive CAD.  She subsequently underwent ICD implant in 2011 with repeat 2018 echocardiogram showing improvement in LV function to normal - low EF.   Since that time, she has been followed by Dr. Caryl Comes for her ICD with most recent visit 07/16/2017.  Her last remote ICD interrogation on 3/5  showed AMS with AT above MTR (1: 1 conduction) and RA oversensing.  On 04/09/2018, she presented to [facility] ED after two syncopal episodes. The first episode reportedly occurred earlier in the week, during which time she injured 1 of her fingers.  The second occurred that day (3/5) and was preceded by lightheadedness. Patient was not able to provide much detail regarding the circumstances surrounding each episode; however, she did feel that she had only lost consciousness for a few minutes after the 3/5 episode.  She denied cardiac symptoms of chest pain, shortness of breath, palpitations, or lightheadedness leading up to the episode.  She was also noted to be hypotensive at presentation with BP 69/58, tachycardic , hypokalemic with K 3.0, and had elevated creatinine at 1.37. With further workup, it was thought she was dehydrated and she improved with holding of home meds, including diuretic. Telemetry was significant for sinus rhythm, sinus tachycardia, predominantly ventricular pacing, and some periods of non-paced beats when the sinus rate exceeded 130 bpm.  Echocardiogram suggested LVEF 40 to 45%.  Her ICD was interrogated however that info isn't available today.  At discharge, her coreg was d/c'd in favor of metoprolol and digoxin dosing was reduced to three x/wk.   Since her discharge, she has not been taking the metoprolol succinate 25 mg daily b/c the pharmacy apparently wouldn't fill it and so her husband has been giving her carvedilol at a reduced dosage of 6.25 mg.  Her husband also reported that he restarted her Lasix 20 mg daily on 3/16, after receiving an update that the patient was volume overloaded (remote device check).  She has not been weighing herself at home.  She denies any lower extremity edema but did report increasing shortness of breath/DOE, orthopnea, and resulting in her increasing her oxygen.  She denied chest pain, palpitations, and early satiety. She stated she continues to feel LH at  times, especially if she gets up too quickly from the seated position.  Ss last about 5 mins and resolve w/ sitting.  She stated she is able to ambulate around her house without further syncopal episodes since her hospitalization.  Of note, the patient has also experiences intermittent 'hot' spells assoc w/ diaphoresis.  Pt and husband says that these date back several months to years and occur at least twice a week, and likely more.   Home Medications    Prior to Admission medications   Medication Sig Start Date End Date Taking? Authorizing Provider  albuterol (PROVENTIL HFA;VENTOLIN HFA) 108 (90 Base) MCG/ACT inhaler Inhale 2 puffs into the lungs every 6 (six) hours as needed. 11/02/16   Schaevitz, Randall An, MD  allopurinol (ZYLOPRIM) 300 MG tablet Take 1 tablet (300 mg total) by mouth daily. 04/12/18   Gladstone Lighter, MD  aspirin 81 MG EC tablet Take 81 mg by mouth daily.      [provider]  digoxin (LANOXIN) 0.125 MG tablet Take 1 tablet by mouth every 3rd day Patient taking differently: Take 0.125 mg by mouth every 3 (three) days.  11/10/17   Deboraha Sprang, MD  furosemide (LASIX) 20 MG tablet Take 1 tablet (20 mg total) by mouth daily as needed for up to 30 days for fluid or edema. 04/12/18 05/12/18  Gladstone Lighter, MD  levothyroxine (SYNTHROID, LEVOTHROID) 112 MCG tablet Take 112 mcg by mouth daily.    [provider]  Melatonin 10 MG TABS Take 10 mg by mouth at bedtime as needed (sleep).    [provider]  Menthol, Topical Analgesic, (BENGAY EX) Apply 1 application topically daily as needed (back pain).    [provider]  metFORMIN (GLUCOPHAGE-XR) 500 MG 24 hr tablet Take 500 mg by mouth 2 (two) times daily. 03/05/18   [provider]  metoprolol succinate (TOPROL XL) 25 MG 24 hr tablet Take 1 tablet (25 mg total) by mouth daily. 04/12/18 07/11/18  Gladstone Lighter, MD  omeprazole (PRILOSEC) 20 MG capsule Take 1 capsule (20 mg total) by  mouth every morning. 04/12/18   Gladstone Lighter, MD  Sennosides (EX-LAX PO) Take 2 tablets by mouth at bedtime as needed (constipation).    [provider]  Terbinafine HCl (LAMISIL SPRAY EX) Apply 1 spray topically daily as needed (foot itching).    [provider]  Tetrahydrozoline HCl (VISINE OP) Apply 1 drop to eye daily as needed (dry eyes).    [provider]  tiotropium (SPIRIVA) 18 MCG inhalation capsule Place 1 capsule (18 mcg total) into inhaler and inhale daily as needed (shortness of breath). 04/12/18   Gladstone Lighter, MD  venlafaxine (EFFEXOR) 75 MG tablet Take 75-150 mg by mouth See admin instructions. Take 2 tablets (150mg ) by mouth every morning and 1 tablet (75mg ) by mouth every evening    [provider]  vitamin B-12 (CYANOCOBALAMIN) 1000 MCG tablet Take 1,000 mcg by mouth 2 (two) times daily.     [provider]    Review of Systems    Progressive shortness of breath, orthopnea, and dyspnea on exertion.  No lower extremity edema.  Improved but continued feelings of orthostatic LH.  Ongoing intermittent diaphoretic spells. No further syncope and improved ability to ambulate around home.  No chest pain, palpitations, or early satiety.  Abdomen and lower extremities tender to palpation.  All other systems reviewed and are otherwise negative except as noted above.  Physical Exam    VS:  BP 124/80 (BP Location: Right Arm, Patient Position: Sitting, Cuff Size: Normal)    Pulse (!) 123    Temp (!) 97.2 F (36.2 C)    Ht 5\' 3"  (1.6 m)    Wt 183 lb 8 oz (83.2 kg)    SpO2 98% Comment: on 3 Liters O2   BMI 32.51 kg/m  , BMI Body mass index is 32.51 kg/m. GEN: Obese female, no distress but appears uncomfortable. States she feels hot and has been diaphoretic. HEENT: On nasal cannula oxygen at 3 L, otw normal. Neck: Supple, JVD difficult to assess due to body habitus, no carotid bruits or masses. Cardiac: Tachycardic, distant heart sounds.   No murmurs, rubs, or gallops. No clubbing, cyanosis, or bilateral lower extremity edema. Lower ext very tender to palpation. Radials/PT 2+ and equal bilaterally.  Respiratory: Diminished breath sounds bilaterally GI: Soft, diffusely tender to palpation, nondistended, BS + x 4. MS: no deformity or atrophy. Skin: warm and dry, no rash. Neuro:  Strength and sensation are intact. Psych: Somewhat distressed.  Accessory Clinical Findings    ECG personally reviewed by me today -  Sinus tachycardia, LBBB, 123 bpm, no EKG from previous admission for comparison.  3 month ICM trend: 04/20/2018    Assessment & Plan    1.  Acute on chronic systolic heart failure / NICM s/p ICD: Patient instructed to stop Lasix at discharge.  Since that time, CorVue impedance monitoring as above and shows patient now wet/volume overloaded with today's weight increase of 5 pounds since discharge and increasing dyspnea, and reduced breath sounds on exam.  Husband resumed lasix 20 daily yesterday.  Plan to increase Lasix to 40 mg daily for 3 days and then decrease back down to 20 daily with follow-up in 1 week given her current volume overload with diuresis necessitates close monitoring to prevent repeat hospitalization.  Will repeat bmet at next appointment, given patient's history of recent AKI in the setting of reduced oral intake on diuresis. As below, defer start of Toprol XL until her follow-up next week. Stop her Coreg. Daily weights encouraged.  2.  Orthostatic hypotension with history of syncope: Tachycardic and currently nl BP (sitting) at today's visit but in the setting of volume overload and some degree of orthostasis - did not feel like she could stand.  Stopping Coreg as above.  3.  Diaphoresis: pt reports intermittent spells of hot flashes and diaphoresis, occurring several days/wk, and dating back months to years.  She is in sinus tachycardia today w/ rate of 123.  She was noted to have similar bouts of  tachycardia during hospitalization.  She is afebrile today and otw nontoxic appearing and hemodynamically stable.  Diuresis planned as above and stopping coreg in the setting of ongoing orthostatic Ss, but ideally would like to add low dose metoprolol as was prev planned @ discharge.  Will re-eval following diuresis next week and look to add  blocker @ that time.  Pt and husband were advised that if she develops a fever, chills, cough, that they should contact PCP for possible e-visit and testing.    4.  Sinus tachycardia: See above.  Etiology unclear, though current evidence of mild volume overload.  Diuresing as above.  Will see back in one week or sooner.  Holding off on metoprolol for now b/c of concern re: orthostasis.  5.  Disposition:  F/u in 1 wk or sooner if necessary.  Murray Hodgkins, NP 04/21/2018, 9:48 AM

## 2018-04-21 NOTE — Telephone Encounter (Signed)
Patient husband calling Patient is scheduled at 3/23 with Angelica Ran at 3:30p Patient also has an appointment at [facility] at 2pm Will need to reschedule for another day or time Please advise

## 2018-04-21 NOTE — Progress Notes (Signed)
ICM remote transmission rescheduled from 3/24 to 3/20 since Furosemide was increased on 04/21/2018 at the office appointment with Ignacia Bayley, NP.

## 2018-04-22 NOTE — Telephone Encounter (Signed)
Noted  

## 2018-04-22 NOTE — Telephone Encounter (Signed)
I attempted to call the patient. We can reschedule her to the morning on 3/24 with Dr. Caryl Comes.  I asked that she call back to reschedule.

## 2018-04-22 NOTE — Telephone Encounter (Signed)
Patient spouse returning call.    Cancelled 3/23 app visit.  Now seeing Dr. Caryl Comes 3/24 at 1130.

## 2018-04-23 DIAGNOSIS — S62634D Displaced fracture of distal phalanx of right ring finger, subsequent encounter for fracture with routine healing: Secondary | ICD-10-CM | POA: Diagnosis not present

## 2018-04-23 DIAGNOSIS — J449 Chronic obstructive pulmonary disease, unspecified: Secondary | ICD-10-CM | POA: Diagnosis not present

## 2018-04-23 DIAGNOSIS — M1990 Unspecified osteoarthritis, unspecified site: Secondary | ICD-10-CM | POA: Diagnosis not present

## 2018-04-23 DIAGNOSIS — I5042 Chronic combined systolic (congestive) and diastolic (congestive) heart failure: Secondary | ICD-10-CM | POA: Diagnosis not present

## 2018-04-23 DIAGNOSIS — I110 Hypertensive heart disease with heart failure: Secondary | ICD-10-CM | POA: Diagnosis not present

## 2018-04-23 DIAGNOSIS — S8001XD Contusion of right knee, subsequent encounter: Secondary | ICD-10-CM | POA: Diagnosis not present

## 2018-04-24 ENCOUNTER — Telehealth: Payer: Self-pay | Admitting: Internal Medicine

## 2018-04-24 ENCOUNTER — Telehealth: Payer: Self-pay

## 2018-04-24 ENCOUNTER — Ambulatory Visit (INDEPENDENT_AMBULATORY_CARE_PROVIDER_SITE_OTHER): Payer: Medicare Other

## 2018-04-24 ENCOUNTER — Other Ambulatory Visit: Payer: Self-pay

## 2018-04-24 DIAGNOSIS — Z95810 Presence of automatic (implantable) cardiac defibrillator: Secondary | ICD-10-CM

## 2018-04-24 DIAGNOSIS — I5022 Chronic systolic (congestive) heart failure: Secondary | ICD-10-CM

## 2018-04-24 NOTE — Progress Notes (Signed)
EPIC Encounter for ICM Monitoring  Patient Name: Stacy Grimes is a 68 y.o. female Date: 04/24/2018 Primary Care Physican: Cyndi Bender, PA-C Primary [place] Electrophysiologist: Caryl Comes Bi-V Pacing: 96% Last Weight:200lbs Today's Weight: unknown  Spoke with husband.  Pt asymptomatic.  She took Lasix 40 mg x 3 days as instructed by Ignacia Bayley, NP on 04/21/2018 and now taking prescribed daily dosage of 20 mg daily.     Thoracic impedancereturned to normal3/18/2020.  Prescribed:Furosemide 20 mg 1 tablet daily.  Labs: 06/17/2017 Creatinine 1.00, BUN 13, Potassium 5.3, Sodium 142, EGFR 58-67  Recommendations:Advised recommendations will be given at tomorrow's appointment.  Follow-up plan: ICM clinic phone appointment on4/07/2018.    Office appt 04/28/2018 with Dr Caryl Comes for defib check.    Copy of ICM check sent to Dr. Caryl Comes and Ignacia Bayley, NP.   3 month ICM trend: 04/24/2018    1 Year ICM trend:       [name], RN 04/24/2018 2:28 PM

## 2018-04-24 NOTE — Telephone Encounter (Signed)
The patient is scheduled to see Dr. Caryl Comes on 3/24. We will need to see per Ignacia Bayley, NP.  I left a message for Mr. Harnish to please call for screening questions.

## 2018-04-24 NOTE — Telephone Encounter (Signed)
I spoke with the patient's husband for pre-screening for COVID-19 for appointment next week.  He will be coming with her- the questions below were asked for the patient and her husband.  The responses were the same for each person.   _____________   [id number] Pre-Screening Questions:  . Do you currently have a fever? No  (yes = cancel and refer to pcp for e-visit) . Have you recently travelled on a cruise, internationally, or to [place], [place], [place], [place], [place], or [place], [place] [employer]) ? No (yes = cancel, stay home, monitor symptoms, and contact pcp or initiate e-visit if symptoms develop) . Have you been in contact with someone that is currently pending confirmation of Covid19 testing or has been confirmed to have the [place] virus?  No No (yes = cancel, stay home, away from tested individual, monitor symptoms, and contact pcp or initiate e-visit if symptoms develop) . Are you currently experiencing fatigue or cough? No (yes = pt should be prepared to have a mask placed at the time of their visit).     Primary Cardiologist: Virl Axe, MD   Pt's husband contacted.  History and symptoms reviewed.  Pt will f/u with [facility] provider as scheduled.  Pt. advised that we are restricting visitors at this time and request that only patients present for check-in prior to their appointment.  All other visitors should remain in their car.  If necessary, only one visitor may come with the patient, into the building. For everyone's safety, all patients and visitor entering our practice area should expect to be screened again prior to entering our waiting area.  Alvis Lemmings, RN  04/24/2018 11:57 AM

## 2018-04-24 NOTE — Telephone Encounter (Signed)
Attempted call to spouse (DPR).  Left message to send remote transmission to recheck fluid levels.  Provided call back number.

## 2018-04-27 ENCOUNTER — Ambulatory Visit: Payer: Medicare Other | Admitting: Nurse Practitioner

## 2018-04-27 NOTE — Telephone Encounter (Addendum)
Noted.  All pre-screening questions of last week- 3/20 were negative.  Will forward to COVID-19 scheduling pool.  She will likely need to be a level 2 at the very least, however, I acknowledge the fact that the patient/ her husband can refuse sooner follow up.

## 2018-04-27 NOTE — Telephone Encounter (Signed)
Patient spouse calling to say she is not coming to this appt.   Patient wants to be seen in July or august after things have settled down with this virus .

## 2018-04-28 ENCOUNTER — Encounter: Payer: Medicare Other | Admitting: Internal Medicine

## 2018-04-29 DIAGNOSIS — M545 Low back pain: Secondary | ICD-10-CM | POA: Diagnosis not present

## 2018-04-29 DIAGNOSIS — S62609A Fracture of unspecified phalanx of unspecified finger, initial encounter for closed fracture: Secondary | ICD-10-CM | POA: Diagnosis not present

## 2018-04-29 DIAGNOSIS — Z950 Presence of cardiac pacemaker: Secondary | ICD-10-CM | POA: Diagnosis not present

## 2018-04-29 DIAGNOSIS — R413 Other amnesia: Secondary | ICD-10-CM | POA: Diagnosis not present

## 2018-05-11 ENCOUNTER — Other Ambulatory Visit: Payer: Self-pay

## 2018-05-11 ENCOUNTER — Ambulatory Visit (INDEPENDENT_AMBULATORY_CARE_PROVIDER_SITE_OTHER): Payer: Medicare Other

## 2018-05-11 DIAGNOSIS — I5022 Chronic systolic (congestive) heart failure: Secondary | ICD-10-CM | POA: Diagnosis not present

## 2018-05-11 DIAGNOSIS — Z95810 Presence of automatic (implantable) cardiac defibrillator: Secondary | ICD-10-CM | POA: Diagnosis not present

## 2018-05-11 NOTE — Progress Notes (Signed)
EPIC Encounter for ICM Monitoring  Patient Name: Stacy Grimes is a 68 y.o. female Date: 05/11/2018 Primary Care Physican: Cyndi Bender, PA-C Primary [place] Electrophysiologist: Caryl Comes Bi-V Pacing: 96% Last Weight:200lbs Today's Weight: unknown  Spoke with husband. Pt asymptomatic.     Thoracic impedancenormal  Prescribed:Furosemide 20 mg 1 tablet daily.  Labs: 06/17/2017 Creatinine 1.00, BUN 13, Potassium 5.3, Sodium 142, EGFR 58-67  Recommendations:Advised recommendations will be given at tomorrow's appointment.  Follow-up plan: ICM clinic phone appointment on5/12/2018.  Copy of ICM check sent to [facility].  3 month ICM trend: 05/11/2018    1 Year ICM trend:       [name], RN 05/11/2018 1:32 PM

## 2018-05-14 NOTE — Telephone Encounter (Signed)
RECALL PLACED FOR 09/05/2018 

## 2018-05-20 NOTE — Progress Notes (Signed)
Per note patient declined Evisit.  Please advise if we need to do in office.

## 2018-05-21 NOTE — Progress Notes (Signed)
Stacy Grimes,   Iva forwarded this to me, but he patient's husband has not been agreeable to an e- visit. Per most recent ICM note from Margarita Grizzle- she states the patient is asymptomatic & her trends look better. I'm not sure what else I can do at this point. Any thoughts?

## 2018-05-24 DIAGNOSIS — Z7984 Long term (current) use of oral hypoglycemic drugs: Secondary | ICD-10-CM | POA: Diagnosis not present

## 2018-05-24 DIAGNOSIS — J449 Chronic obstructive pulmonary disease, unspecified: Secondary | ICD-10-CM | POA: Diagnosis not present

## 2018-05-24 DIAGNOSIS — M109 Gout, unspecified: Secondary | ICD-10-CM | POA: Diagnosis not present

## 2018-05-24 DIAGNOSIS — G8929 Other chronic pain: Secondary | ICD-10-CM | POA: Diagnosis not present

## 2018-05-24 DIAGNOSIS — I509 Heart failure, unspecified: Secondary | ICD-10-CM | POA: Diagnosis not present

## 2018-05-24 DIAGNOSIS — Z79891 Long term (current) use of opiate analgesic: Secondary | ICD-10-CM | POA: Diagnosis not present

## 2018-05-24 DIAGNOSIS — Z9981 Dependence on supplemental oxygen: Secondary | ICD-10-CM | POA: Diagnosis not present

## 2018-05-24 DIAGNOSIS — J9610 Chronic respiratory failure, unspecified whether with hypoxia or hypercapnia: Secondary | ICD-10-CM | POA: Diagnosis not present

## 2018-05-24 DIAGNOSIS — G2581 Restless legs syndrome: Secondary | ICD-10-CM | POA: Diagnosis not present

## 2018-05-24 DIAGNOSIS — Z950 Presence of cardiac pacemaker: Secondary | ICD-10-CM | POA: Diagnosis not present

## 2018-05-24 DIAGNOSIS — M542 Cervicalgia: Secondary | ICD-10-CM | POA: Diagnosis not present

## 2018-05-24 DIAGNOSIS — F419 Anxiety disorder, unspecified: Secondary | ICD-10-CM | POA: Diagnosis not present

## 2018-05-24 DIAGNOSIS — I110 Hypertensive heart disease with heart failure: Secondary | ICD-10-CM | POA: Diagnosis not present

## 2018-05-24 DIAGNOSIS — Z7951 Long term (current) use of inhaled steroids: Secondary | ICD-10-CM | POA: Diagnosis not present

## 2018-05-24 DIAGNOSIS — S32009S Unspecified fracture of unspecified lumbar vertebra, sequela: Secondary | ICD-10-CM | POA: Diagnosis not present

## 2018-05-24 DIAGNOSIS — Z87891 Personal history of nicotine dependence: Secondary | ICD-10-CM | POA: Diagnosis not present

## 2018-05-24 DIAGNOSIS — M179 Osteoarthritis of knee, unspecified: Secondary | ICD-10-CM | POA: Diagnosis not present

## 2018-05-24 DIAGNOSIS — K219 Gastro-esophageal reflux disease without esophagitis: Secondary | ICD-10-CM | POA: Diagnosis not present

## 2018-05-24 DIAGNOSIS — F329 Major depressive disorder, single episode, unspecified: Secondary | ICD-10-CM | POA: Diagnosis not present

## 2018-05-24 DIAGNOSIS — R413 Other amnesia: Secondary | ICD-10-CM | POA: Diagnosis not present

## 2018-05-24 DIAGNOSIS — D649 Anemia, unspecified: Secondary | ICD-10-CM | POA: Diagnosis not present

## 2018-05-24 DIAGNOSIS — G4733 Obstructive sleep apnea (adult) (pediatric): Secondary | ICD-10-CM | POA: Diagnosis not present

## 2018-05-24 DIAGNOSIS — M545 Low back pain: Secondary | ICD-10-CM | POA: Diagnosis not present

## 2018-05-24 DIAGNOSIS — E039 Hypothyroidism, unspecified: Secondary | ICD-10-CM | POA: Diagnosis not present

## 2018-05-24 DIAGNOSIS — E1142 Type 2 diabetes mellitus with diabetic polyneuropathy: Secondary | ICD-10-CM | POA: Diagnosis not present

## 2018-05-25 DIAGNOSIS — J449 Chronic obstructive pulmonary disease, unspecified: Secondary | ICD-10-CM | POA: Diagnosis not present

## 2018-05-25 DIAGNOSIS — S32009S Unspecified fracture of unspecified lumbar vertebra, sequela: Secondary | ICD-10-CM | POA: Diagnosis not present

## 2018-05-25 DIAGNOSIS — I110 Hypertensive heart disease with heart failure: Secondary | ICD-10-CM | POA: Diagnosis not present

## 2018-05-25 DIAGNOSIS — E1142 Type 2 diabetes mellitus with diabetic polyneuropathy: Secondary | ICD-10-CM | POA: Diagnosis not present

## 2018-05-25 DIAGNOSIS — M545 Low back pain: Secondary | ICD-10-CM | POA: Diagnosis not present

## 2018-05-25 DIAGNOSIS — I509 Heart failure, unspecified: Secondary | ICD-10-CM | POA: Diagnosis not present

## 2018-05-29 DIAGNOSIS — I110 Hypertensive heart disease with heart failure: Secondary | ICD-10-CM | POA: Diagnosis not present

## 2018-05-29 DIAGNOSIS — J449 Chronic obstructive pulmonary disease, unspecified: Secondary | ICD-10-CM | POA: Diagnosis not present

## 2018-05-29 DIAGNOSIS — S32009S Unspecified fracture of unspecified lumbar vertebra, sequela: Secondary | ICD-10-CM | POA: Diagnosis not present

## 2018-05-29 DIAGNOSIS — E1142 Type 2 diabetes mellitus with diabetic polyneuropathy: Secondary | ICD-10-CM | POA: Diagnosis not present

## 2018-05-29 DIAGNOSIS — I509 Heart failure, unspecified: Secondary | ICD-10-CM | POA: Diagnosis not present

## 2018-05-29 DIAGNOSIS — M545 Low back pain: Secondary | ICD-10-CM | POA: Diagnosis not present

## 2018-06-04 DIAGNOSIS — E1142 Type 2 diabetes mellitus with diabetic polyneuropathy: Secondary | ICD-10-CM | POA: Diagnosis not present

## 2018-06-04 DIAGNOSIS — S32009S Unspecified fracture of unspecified lumbar vertebra, sequela: Secondary | ICD-10-CM | POA: Diagnosis not present

## 2018-06-04 DIAGNOSIS — J449 Chronic obstructive pulmonary disease, unspecified: Secondary | ICD-10-CM | POA: Diagnosis not present

## 2018-06-04 DIAGNOSIS — M545 Low back pain: Secondary | ICD-10-CM | POA: Diagnosis not present

## 2018-06-04 DIAGNOSIS — I509 Heart failure, unspecified: Secondary | ICD-10-CM | POA: Diagnosis not present

## 2018-06-04 DIAGNOSIS — I110 Hypertensive heart disease with heart failure: Secondary | ICD-10-CM | POA: Diagnosis not present

## 2018-06-10 DIAGNOSIS — M545 Low back pain: Secondary | ICD-10-CM | POA: Diagnosis not present

## 2018-06-10 DIAGNOSIS — I110 Hypertensive heart disease with heart failure: Secondary | ICD-10-CM | POA: Diagnosis not present

## 2018-06-10 DIAGNOSIS — I509 Heart failure, unspecified: Secondary | ICD-10-CM | POA: Diagnosis not present

## 2018-06-10 DIAGNOSIS — E1142 Type 2 diabetes mellitus with diabetic polyneuropathy: Secondary | ICD-10-CM | POA: Diagnosis not present

## 2018-06-10 DIAGNOSIS — J449 Chronic obstructive pulmonary disease, unspecified: Secondary | ICD-10-CM | POA: Diagnosis not present

## 2018-06-10 DIAGNOSIS — S32009S Unspecified fracture of unspecified lumbar vertebra, sequela: Secondary | ICD-10-CM | POA: Diagnosis not present

## 2018-06-15 ENCOUNTER — Ambulatory Visit (INDEPENDENT_AMBULATORY_CARE_PROVIDER_SITE_OTHER): Payer: Medicare Other

## 2018-06-15 ENCOUNTER — Other Ambulatory Visit: Payer: Self-pay

## 2018-06-15 DIAGNOSIS — I5022 Chronic systolic (congestive) heart failure: Secondary | ICD-10-CM | POA: Diagnosis not present

## 2018-06-15 DIAGNOSIS — Z95810 Presence of automatic (implantable) cardiac defibrillator: Secondary | ICD-10-CM | POA: Diagnosis not present

## 2018-06-17 DIAGNOSIS — M545 Low back pain: Secondary | ICD-10-CM | POA: Diagnosis not present

## 2018-06-17 DIAGNOSIS — J449 Chronic obstructive pulmonary disease, unspecified: Secondary | ICD-10-CM | POA: Diagnosis not present

## 2018-06-17 DIAGNOSIS — I110 Hypertensive heart disease with heart failure: Secondary | ICD-10-CM | POA: Diagnosis not present

## 2018-06-17 DIAGNOSIS — S32009S Unspecified fracture of unspecified lumbar vertebra, sequela: Secondary | ICD-10-CM | POA: Diagnosis not present

## 2018-06-17 DIAGNOSIS — I509 Heart failure, unspecified: Secondary | ICD-10-CM | POA: Diagnosis not present

## 2018-06-17 DIAGNOSIS — E1142 Type 2 diabetes mellitus with diabetic polyneuropathy: Secondary | ICD-10-CM | POA: Diagnosis not present

## 2018-06-17 NOTE — Progress Notes (Signed)
EPIC Encounter for ICM Monitoring  Patient Name: Stacy Grimes is a 68 y.o. female Date: 06/17/2018 Primary Care Physican: Cyndi Bender, PA-C Primary [place] Electrophysiologist: Caryl Comes Bi-V Pacing: 96% Last Weight:200lbs Today's Weight: unknown  Spoke with husband and pt asymptomatic.   Thoracic impedancenormal  Prescribed:Furosemide20 mg 1 tabletdaily.  Labs: 06/17/2017 Creatinine 1.00, BUN 13, Potassium 5.3, Sodium 142, EGFR 58-67  Recommendations:Encouraged to call if she has any fluid symptoms.    Follow-up plan: ICM clinic phone appointment on6/15/2020.  Copy of ICM check sent to [facility].  3 month ICM trend: 06/15/2018    1 Year ICM trend:       [name], RN 06/17/2018 8:10 AM

## 2018-06-23 DIAGNOSIS — K219 Gastro-esophageal reflux disease without esophagitis: Secondary | ICD-10-CM | POA: Diagnosis not present

## 2018-06-23 DIAGNOSIS — F419 Anxiety disorder, unspecified: Secondary | ICD-10-CM | POA: Diagnosis not present

## 2018-06-23 DIAGNOSIS — Z9981 Dependence on supplemental oxygen: Secondary | ICD-10-CM | POA: Diagnosis not present

## 2018-06-23 DIAGNOSIS — E039 Hypothyroidism, unspecified: Secondary | ICD-10-CM | POA: Diagnosis not present

## 2018-06-23 DIAGNOSIS — M179 Osteoarthritis of knee, unspecified: Secondary | ICD-10-CM | POA: Diagnosis not present

## 2018-06-23 DIAGNOSIS — S32009S Unspecified fracture of unspecified lumbar vertebra, sequela: Secondary | ICD-10-CM | POA: Diagnosis not present

## 2018-06-23 DIAGNOSIS — J9610 Chronic respiratory failure, unspecified whether with hypoxia or hypercapnia: Secondary | ICD-10-CM | POA: Diagnosis not present

## 2018-06-23 DIAGNOSIS — M542 Cervicalgia: Secondary | ICD-10-CM | POA: Diagnosis not present

## 2018-06-23 DIAGNOSIS — Z950 Presence of cardiac pacemaker: Secondary | ICD-10-CM | POA: Diagnosis not present

## 2018-06-23 DIAGNOSIS — Z87891 Personal history of nicotine dependence: Secondary | ICD-10-CM | POA: Diagnosis not present

## 2018-06-23 DIAGNOSIS — G4733 Obstructive sleep apnea (adult) (pediatric): Secondary | ICD-10-CM | POA: Diagnosis not present

## 2018-06-23 DIAGNOSIS — R413 Other amnesia: Secondary | ICD-10-CM | POA: Diagnosis not present

## 2018-06-23 DIAGNOSIS — M109 Gout, unspecified: Secondary | ICD-10-CM | POA: Diagnosis not present

## 2018-06-23 DIAGNOSIS — J449 Chronic obstructive pulmonary disease, unspecified: Secondary | ICD-10-CM | POA: Diagnosis not present

## 2018-06-23 DIAGNOSIS — Z79891 Long term (current) use of opiate analgesic: Secondary | ICD-10-CM | POA: Diagnosis not present

## 2018-06-23 DIAGNOSIS — G8929 Other chronic pain: Secondary | ICD-10-CM | POA: Diagnosis not present

## 2018-06-23 DIAGNOSIS — F329 Major depressive disorder, single episode, unspecified: Secondary | ICD-10-CM | POA: Diagnosis not present

## 2018-06-23 DIAGNOSIS — Z7984 Long term (current) use of oral hypoglycemic drugs: Secondary | ICD-10-CM | POA: Diagnosis not present

## 2018-06-23 DIAGNOSIS — I110 Hypertensive heart disease with heart failure: Secondary | ICD-10-CM | POA: Diagnosis not present

## 2018-06-23 DIAGNOSIS — D649 Anemia, unspecified: Secondary | ICD-10-CM | POA: Diagnosis not present

## 2018-06-23 DIAGNOSIS — M545 Low back pain: Secondary | ICD-10-CM | POA: Diagnosis not present

## 2018-06-23 DIAGNOSIS — E1142 Type 2 diabetes mellitus with diabetic polyneuropathy: Secondary | ICD-10-CM | POA: Diagnosis not present

## 2018-06-23 DIAGNOSIS — Z7951 Long term (current) use of inhaled steroids: Secondary | ICD-10-CM | POA: Diagnosis not present

## 2018-06-23 DIAGNOSIS — I509 Heart failure, unspecified: Secondary | ICD-10-CM | POA: Diagnosis not present

## 2018-06-23 DIAGNOSIS — G2581 Restless legs syndrome: Secondary | ICD-10-CM | POA: Diagnosis not present

## 2018-06-24 DIAGNOSIS — R4189 Other symptoms and signs involving cognitive functions and awareness: Secondary | ICD-10-CM | POA: Diagnosis not present

## 2018-06-24 DIAGNOSIS — I110 Hypertensive heart disease with heart failure: Secondary | ICD-10-CM | POA: Diagnosis not present

## 2018-06-24 DIAGNOSIS — S32009S Unspecified fracture of unspecified lumbar vertebra, sequela: Secondary | ICD-10-CM | POA: Diagnosis not present

## 2018-06-24 DIAGNOSIS — M545 Low back pain: Secondary | ICD-10-CM | POA: Diagnosis not present

## 2018-06-24 DIAGNOSIS — R413 Other amnesia: Secondary | ICD-10-CM | POA: Diagnosis not present

## 2018-06-24 DIAGNOSIS — I509 Heart failure, unspecified: Secondary | ICD-10-CM | POA: Diagnosis not present

## 2018-06-24 DIAGNOSIS — E1142 Type 2 diabetes mellitus with diabetic polyneuropathy: Secondary | ICD-10-CM | POA: Diagnosis not present

## 2018-06-24 DIAGNOSIS — J449 Chronic obstructive pulmonary disease, unspecified: Secondary | ICD-10-CM | POA: Diagnosis not present

## 2018-06-25 ENCOUNTER — Ambulatory Visit: Payer: Medicare Other | Admitting: Cardiovascular Disease

## 2018-06-26 ENCOUNTER — Telehealth: Payer: Self-pay | Admitting: *Deleted

## 2018-06-26 NOTE — Telephone Encounter (Signed)
I left a message on the [place] line that she may have generic digoxin per Stacy Bayley, NP.

## 2018-06-26 NOTE — Telephone Encounter (Signed)
Generic acceptable.

## 2018-06-26 NOTE — Telephone Encounter (Signed)
Received request from [place] pharmacist, Sheria Lang., RPh. "We are requesting generic manufacturer change."  Ignacia Bayley, NP last saw patient in March 2020. Routing to him to see if he can address.

## 2018-06-26 NOTE — Telephone Encounter (Signed)
Addendum:  Generic request is referring to patient digoxin 0.125 mg tablets by mouth every 3 days.

## 2018-07-09 ENCOUNTER — Ambulatory Visit (INDEPENDENT_AMBULATORY_CARE_PROVIDER_SITE_OTHER): Payer: Medicare Other | Admitting: *Deleted

## 2018-07-09 DIAGNOSIS — I428 Other cardiomyopathies: Secondary | ICD-10-CM

## 2018-07-09 DIAGNOSIS — S32009S Unspecified fracture of unspecified lumbar vertebra, sequela: Secondary | ICD-10-CM | POA: Diagnosis not present

## 2018-07-09 DIAGNOSIS — I110 Hypertensive heart disease with heart failure: Secondary | ICD-10-CM | POA: Diagnosis not present

## 2018-07-09 DIAGNOSIS — M545 Low back pain: Secondary | ICD-10-CM | POA: Diagnosis not present

## 2018-07-09 DIAGNOSIS — I5022 Chronic systolic (congestive) heart failure: Secondary | ICD-10-CM | POA: Diagnosis not present

## 2018-07-09 DIAGNOSIS — J449 Chronic obstructive pulmonary disease, unspecified: Secondary | ICD-10-CM | POA: Diagnosis not present

## 2018-07-09 DIAGNOSIS — I509 Heart failure, unspecified: Secondary | ICD-10-CM | POA: Diagnosis not present

## 2018-07-09 DIAGNOSIS — E1142 Type 2 diabetes mellitus with diabetic polyneuropathy: Secondary | ICD-10-CM | POA: Diagnosis not present

## 2018-07-09 LAB — CUP PACEART REMOTE DEVICE CHECK
Battery Remaining Longevity: 59 mo
Battery Remaining Percentage: 73 %
Battery Voltage: 2.96 V
Brady Statistic AP VP Percent: 1 %
Brady Statistic AP VS Percent: 1 %
Brady Statistic AS VP Percent: 96 %
Brady Statistic AS VS Percent: 4 %
Brady Statistic RA Percent Paced: 1 %
Brady Statistic RV Percent Paced: 96 %
Date Time Interrogation Session: 20200604060014
HighPow Impedance: 57 Ohm
HighPow Impedance: 57 Ohm
Implantable Lead Implant Date: 20110309
Implantable Lead Implant Date: 20110309
Implantable Lead Implant Date: 20110309
Implantable Lead Location: 753858
Implantable Lead Location: 753859
Implantable Lead Location: 753860
Implantable Lead Model: 7121
Implantable Pulse Generator Implant Date: 20180814
Lead Channel Impedance Value: 380 Ohm
Lead Channel Impedance Value: 530 Ohm
Lead Channel Impedance Value: 650 Ohm
Lead Channel Pacing Threshold Amplitude: 0.875 V
Lead Channel Pacing Threshold Pulse Width: 0.5 ms
Lead Channel Sensing Intrinsic Amplitude: 11.7 mV
Lead Channel Sensing Intrinsic Amplitude: 5 mV
Lead Channel Setting Pacing Amplitude: 2 V
Lead Channel Setting Pacing Amplitude: 2.25 V
Lead Channel Setting Pacing Amplitude: 2.5 V
Lead Channel Setting Pacing Pulse Width: 0.5 ms
Lead Channel Setting Pacing Pulse Width: 0.5 ms
Lead Channel Setting Sensing Sensitivity: 0.5 mV
Pulse Gen Serial Number: 9761609

## 2018-07-10 DIAGNOSIS — R413 Other amnesia: Secondary | ICD-10-CM | POA: Diagnosis not present

## 2018-07-10 DIAGNOSIS — G8929 Other chronic pain: Secondary | ICD-10-CM | POA: Diagnosis not present

## 2018-07-10 DIAGNOSIS — M109 Gout, unspecified: Secondary | ICD-10-CM | POA: Diagnosis not present

## 2018-07-10 DIAGNOSIS — I10 Essential (primary) hypertension: Secondary | ICD-10-CM | POA: Diagnosis not present

## 2018-07-10 DIAGNOSIS — E039 Hypothyroidism, unspecified: Secondary | ICD-10-CM | POA: Diagnosis not present

## 2018-07-10 DIAGNOSIS — E1165 Type 2 diabetes mellitus with hyperglycemia: Secondary | ICD-10-CM | POA: Diagnosis not present

## 2018-07-10 DIAGNOSIS — Z79899 Other long term (current) drug therapy: Secondary | ICD-10-CM | POA: Diagnosis not present

## 2018-07-16 NOTE — Progress Notes (Signed)
Remote ICD transmission.   

## 2018-07-20 ENCOUNTER — Ambulatory Visit (INDEPENDENT_AMBULATORY_CARE_PROVIDER_SITE_OTHER): Payer: Medicare Other

## 2018-07-20 DIAGNOSIS — Z95810 Presence of automatic (implantable) cardiac defibrillator: Secondary | ICD-10-CM | POA: Diagnosis not present

## 2018-07-20 DIAGNOSIS — I5022 Chronic systolic (congestive) heart failure: Secondary | ICD-10-CM | POA: Diagnosis not present

## 2018-07-22 ENCOUNTER — Telehealth: Payer: Self-pay

## 2018-07-22 DIAGNOSIS — E1142 Type 2 diabetes mellitus with diabetic polyneuropathy: Secondary | ICD-10-CM | POA: Diagnosis not present

## 2018-07-22 DIAGNOSIS — J449 Chronic obstructive pulmonary disease, unspecified: Secondary | ICD-10-CM | POA: Diagnosis not present

## 2018-07-22 DIAGNOSIS — M545 Low back pain: Secondary | ICD-10-CM | POA: Diagnosis not present

## 2018-07-22 DIAGNOSIS — I509 Heart failure, unspecified: Secondary | ICD-10-CM | POA: Diagnosis not present

## 2018-07-22 DIAGNOSIS — S32009S Unspecified fracture of unspecified lumbar vertebra, sequela: Secondary | ICD-10-CM | POA: Diagnosis not present

## 2018-07-22 DIAGNOSIS — I110 Hypertensive heart disease with heart failure: Secondary | ICD-10-CM | POA: Diagnosis not present

## 2018-07-22 NOTE — Telephone Encounter (Signed)
Remote ICM transmission received.  Attempted call to husband regarding ICM remote transmission and left detailed message, per DPR, with next ICM remote transmission date of 08/24/2018.  Advised to return call for any fluid symptoms or questions.

## 2018-07-22 NOTE — Progress Notes (Signed)
EPIC Encounter for ICM Monitoring  Patient Name: Stacy Grimes is a 68 y.o. female Date: 07/22/2018 Primary Care Physican: Cyndi Bender, PA-C Primary [place] Electrophysiologist: Caryl Comes Bi-V Pacing: 96% Last Weight:200lbs Today's Weight: unknown  Attempted call to husband and unable to reach.  Left detailed message per DPR regarding transmission. Transmission reviewed.   Coruve thoracic impedancenormal  Prescribed:Furosemide20 mg 1 tabletdaily.  Labs: 06/17/2017 Creatinine 1.00, BUN 13, Potassium 5.3, Sodium 142, EGFR 58-67  Recommendations:Left voice mail with ICM number and encouraged to call if experiencing any fluid symptoms.  Follow-up plan: ICM clinic phone appointment on 08/24/2018.  Copy of ICM check sent to [facility].  3 month ICM trend: 07/20/2018    1 Year ICM trend:       [name], RN 07/22/2018 7:54 AM

## 2018-07-27 ENCOUNTER — Telehealth: Payer: Self-pay | Admitting: Cardiovascular Disease

## 2018-07-27 NOTE — Telephone Encounter (Signed)
°*  STAT* If patient is at the pharmacy, call can be transferred to refill team.   1. Which medications need to be refilled? (please list name of each medication and dose if known) unknown cardiac med  2. Which pharmacy/location (including street and city if local pharmacy) is medication to be sent to? Unknown   3. Do they need a 30 day or 90 day supply? Unknown  Received call from patient spouse to fill blood pressure/ card medication that patient is currently out of and needs for today.    Patient spouse became beligerent and even after asked several times to please stop swearing at me he refused and stated we needed to get off our ass and talk to Dr. Tressia Miners about this medication   After several attempts to discourage spouse from swearing at me and stating I am trying to help him facilitate a refill request the call was disconnect by me as he continued to swear.    Unable to obtain further information on refill

## 2018-07-28 NOTE — Telephone Encounter (Signed)
Pt's husband calling and leaving a message apologizing for the telephone call before and that the pt's PCP will be refill the pt's BP medication and that the pt will only be seeing Dr. Fletcher Anon from now on and if you needed to speak with him concerning this matter, to give him a call at [phone]. FYI

## 2018-08-18 ENCOUNTER — Telehealth: Payer: Self-pay | Admitting: Internal Medicine

## 2018-08-18 NOTE — Telephone Encounter (Signed)
Received call from patient's husband that Stacy Grimes has been experiencing nausea and intermittent emesis throughout the day. He recorded her BP and HR while she was on the toilet and found it to be 105/75 with HR 118. Called back and asked Stacy Grimes to recheck patient's vitals which were 129/70 with HR 106. Patient was asked whether she felt poorly enough to come in to the ED for evaluation and she said no. She is able to keep food and fluids down. She denies any chest pain, SOB, lightheadedness/dizziness. Patient was given strict anticipatory guidance to call back if patient experiences an increase or change in the quality or severity of her discomfort OR she were have SBP < 100 or HR > 110 sustained on two checks. She was instructed to continue to take in fluids as able, and if unable, to come in to the ED for further evaluation. Stacy Grimes voiced full understanding of these instructions.

## 2018-08-24 ENCOUNTER — Ambulatory Visit (INDEPENDENT_AMBULATORY_CARE_PROVIDER_SITE_OTHER): Payer: Medicare Other

## 2018-08-24 DIAGNOSIS — Z95810 Presence of automatic (implantable) cardiac defibrillator: Secondary | ICD-10-CM

## 2018-08-24 DIAGNOSIS — I5022 Chronic systolic (congestive) heart failure: Secondary | ICD-10-CM | POA: Diagnosis not present

## 2018-08-28 NOTE — Progress Notes (Signed)
EPIC Encounter for ICM Monitoring  Patient Name: Stacy Grimes is a 68 y.o. female Date: 08/28/2018 Primary Care Physican: Cyndi Bender, PA-C Primary [place] Electrophysiologist: Caryl Comes Bi-V Pacing: 96% 08/28/2018 Weight: 198 lbs  Spoke with husband and patient is feeling fine. Denies she has any fluid symptoms  Coruve thoracic impedancesuggesting of possible fluid accumulation but is trending back to baseline.  Prescribed:Furosemide20 mg 1 tabletdaily.  Labs: 04/12/2018 Creatinine 0.97, BUN 10, Potassium 4.1, Sodium 137, GFR >60 04/11/2018 Creatinine 1.17, BUN 16, Potassium 3.8, Sodium 136, GFR 48-55  04/10/2018 Creatinine 1.37, BUN 24, Potassium 3.0, Sodium 136, GFR 40-46  04/09/2018 Creatinine 1.44, BUN 27, Potassium 3.4, Sodium 134, GFR 37-43  A complete set of results can be found in Results Review.  Recommendations:Encouraged to call if experiencing any fluid symptoms.  Follow-up plan: ICM clinic phone appointment on 09/29/2018.  Copy of ICM check sent to [place].  3 month ICM trend: 08/24/2018    1 Year ICM trend:       [name], RN 08/28/2018 12:11 PM

## 2018-09-25 ENCOUNTER — Telehealth: Payer: Self-pay

## 2018-09-25 NOTE — Telephone Encounter (Signed)
Returned call to husband as requested by voice mail message.  He reports Dr Caryl Comes was supposed to consult with Dr Burt Knack about if patient can have an MRI of the brain since she has a device.  He said someone was supposed to call him back with him about the information but have not received an update.  Advised will forward the note to Dr Olin Pia nurse for follow up.

## 2018-09-28 NOTE — Telephone Encounter (Signed)
I am unaware of any conversations about this. Will forward to Dr. Caryl Comes to review and to [facility] to confirm if she has an MRI compatible device.

## 2018-09-28 NOTE — Telephone Encounter (Signed)
Discussed with pt husband.   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  Husband states they will let Dr. Burt Knack (Neurology - [facility]) know.    Legrand Como [address]" [place], PA-C 09/28/2018 1:23 PM

## 2018-09-29 ENCOUNTER — Ambulatory Visit (INDEPENDENT_AMBULATORY_CARE_PROVIDER_SITE_OTHER): Payer: Medicare Other

## 2018-09-29 DIAGNOSIS — Z95810 Presence of automatic (implantable) cardiac defibrillator: Secondary | ICD-10-CM | POA: Diagnosis not present

## 2018-09-29 DIAGNOSIS — I5022 Chronic systolic (congestive) heart failure: Secondary | ICD-10-CM | POA: Diagnosis not present

## 2018-09-30 NOTE — Telephone Encounter (Signed)
Noted  

## 2018-10-02 ENCOUNTER — Telehealth: Payer: Self-pay

## 2018-10-02 NOTE — Telephone Encounter (Signed)
Remote ICM transmission received.  Attempted call to husband regarding ICM remote transmission and left detailed message, per DPR.

## 2018-10-02 NOTE — Progress Notes (Signed)
EPIC Encounter for ICM Monitoring  Patient Name: Stacy Grimes is a 68 y.o. female Date: 10/02/2018 Primary Care Physican: Cyndi Bender, PA-C Primary [place] Electrophysiologist: Caryl Comes Bi-V Pacing: 96% 08/28/2018 Weight: 198 lbs  Attempted call to husband and unable to reach.  Left detailed message per DPR regarding transmission. Transmission reviewed.   Coruve thoracic impedancesuggesting of possible fluid accumulation but is trending back to baseline.  Prescribed:Furosemide20 mg 1 tabletdaily.  Labs: 04/12/2018 Creatinine 0.97, BUN 10, Potassium 4.1, Sodium 137, GFR >60 04/11/2018 Creatinine 1.17, BUN 16, Potassium 3.8, Sodium 136, GFR 48-55  04/10/2018 Creatinine 1.37, BUN 24, Potassium 3.0, Sodium 136, GFR 40-46  04/09/2018 Creatinine 1.44, BUN 27, Potassium 3.4, Sodium 134, GFR 37-43  A complete set of results can be found in Results Review.  Recommendations: Left voice mail with ICM number and encouraged to call if experiencing any fluid symptoms.  Follow-up plan: ICM clinic phone appointment on10/01/2019.  91 day device clinic remote transmission 10/08/2018  Copy of ICM check sent to [place].  3 month ICM trend: 09/29/2018    1 Year ICM trend:       [name], RN 10/02/2018 11:01 AM

## 2018-10-08 ENCOUNTER — Encounter: Payer: Medicare Other | Admitting: *Deleted

## 2018-10-08 LAB — CUP PACEART REMOTE DEVICE CHECK
Battery Remaining Longevity: 55 mo
Battery Remaining Percentage: 69 %
Battery Voltage: 2.96 V
Brady Statistic AP VP Percent: 1 %
Brady Statistic AP VS Percent: 1 %
Brady Statistic AS VP Percent: 96 %
Brady Statistic AS VS Percent: 3.9 %
Brady Statistic RA Percent Paced: 1 %
Date Time Interrogation Session: 20200903060017
HighPow Impedance: 56 Ohm
HighPow Impedance: 56 Ohm
Implantable Lead Implant Date: 20110309
Implantable Lead Implant Date: 20110309
Implantable Lead Implant Date: 20110309
Implantable Lead Location: 753858
Implantable Lead Location: 753859
Implantable Lead Location: 753860
Implantable Lead Model: 7121
Implantable Pulse Generator Implant Date: 20180814
Lead Channel Impedance Value: 380 Ohm
Lead Channel Impedance Value: 480 Ohm
Lead Channel Impedance Value: 630 Ohm
Lead Channel Pacing Threshold Amplitude: 0.5 V
Lead Channel Pacing Threshold Amplitude: 0.75 V
Lead Channel Pacing Threshold Amplitude: 1 V
Lead Channel Pacing Threshold Pulse Width: 0.5 ms
Lead Channel Pacing Threshold Pulse Width: 0.5 ms
Lead Channel Pacing Threshold Pulse Width: 0.5 ms
Lead Channel Sensing Intrinsic Amplitude: 11.7 mV
Lead Channel Sensing Intrinsic Amplitude: 5 mV
Lead Channel Setting Pacing Amplitude: 2 V
Lead Channel Setting Pacing Amplitude: 2.25 V
Lead Channel Setting Pacing Amplitude: 2.5 V
Lead Channel Setting Pacing Pulse Width: 0.5 ms
Lead Channel Setting Pacing Pulse Width: 0.5 ms
Lead Channel Setting Sensing Sensitivity: 0.5 mV
Pulse Gen Serial Number: 9761609

## 2018-10-19 DIAGNOSIS — R4189 Other symptoms and signs involving cognitive functions and awareness: Secondary | ICD-10-CM | POA: Diagnosis not present

## 2018-11-10 ENCOUNTER — Telehealth: Payer: Self-pay

## 2018-11-10 ENCOUNTER — Ambulatory Visit (INDEPENDENT_AMBULATORY_CARE_PROVIDER_SITE_OTHER): Payer: Medicare Other

## 2018-11-10 ENCOUNTER — Telehealth: Payer: Self-pay | Admitting: Cardiovascular Disease

## 2018-11-10 DIAGNOSIS — Z95810 Presence of automatic (implantable) cardiac defibrillator: Secondary | ICD-10-CM | POA: Diagnosis not present

## 2018-11-10 DIAGNOSIS — I5022 Chronic systolic (congestive) heart failure: Secondary | ICD-10-CM

## 2018-11-10 NOTE — Progress Notes (Signed)
EPIC Encounter for ICM Monitoring  Patient Name: Stacy Grimes is a 68 y.o. female Date: 11/10/2018 Primary Care Physican: Cyndi Bender, PA-C Primary [place] Electrophysiologist: Caryl Comes Bi-V Pacing: 96% LastWeight: 198 lbs        Spoke with husband.  He reports patient had a couple of episodes of vomiting this morning and BP 166/90 p 123.  At 12:45 PM BP had decreased to 131/90 pulse 100.         He denies she has a fever or cough. She feel's better this afternoon and able to eat/drink.        Prior to today, BP has been averaging <131/90 in the last month.  Advised sickness/vomiting can cause an increase in BP readings.   She eats diet high in salt and explained salt can contribute to high BP and cause fluid retention.  Patient had initial evaluation appointment May 2020 with Dr Fletcher Anon but husband canceled and has not rescheduled.      Patient has PCP appointment 10/8 and advised husband to take BP readings to discuss with PCP.  Coruve thoracic impedancesuggesting possible dryness which correlates with vomiting episode this AM.  Prescribed:Furosemide20 mg 1 tabletdaily.  Labs: 04/12/2018 Creatinine0.97, BUN10, Potassium4.1, Sodium137, GFR>60 04/11/2018 Creatinine1.17, BUN16, Potassium3.8, Sodium136, [id number]  04/10/2018 Creatinine1.37, BUN24, Potassium3.0, Sodium136, GFR40-46  04/09/2018 Creatinine1.44, BUN27, Potassium3.4, [id number], [id number] A complete set of results can be found in Results Review.  Recommendations: Reinforced limiting salt intake to < 2000 mg daily.  Phone note sent to Dr Caryl Comes for review/recommendations if needed.   Advised to call office to reschedule appointment with Dr Fletcher Anon.  Follow-up plan: ICM clinic phone appointment on 12/14/2018.      Copy of ICM check sent to Dr. Caryl Comes.   3 month ICM trend: 11/10/2018    1 Year ICM trend:       [name], RN 11/10/2018 4:49 PM

## 2018-11-10 NOTE — Telephone Encounter (Signed)
Pt c/o BP issue: STAT if pt c/o blurred vision, one-sided weakness or slurred speech  1. What are your last 5 BP readings?  166/90 HR 123 right after vomiting   2. Are you having any other symptoms (ex. Dizziness, headache, blurred vision, passed out)? Nausea vomiting overall not feeling well   3. What is your BP issue?  Patient spouse concerned about elevated bp and patient not feeling well.   Please call

## 2018-11-10 NOTE — Telephone Encounter (Signed)
Returned the call to the patient's husband. Lmtcb.  Called the telephone number listed for the patient x2. Message sts that the call cannot be completed at this time.

## 2018-11-10 NOTE — Telephone Encounter (Signed)
Returned call to husband as requested by voice mail message.   He reports patient patient had a couple of episodes of vomiting this morning and BP 166/90 p 123.  At 12:45 PM BP had decreased to 131/90 pulse 100.   He denies she has a fever or cough. She feel's better this afternoon.  Prior to today, BP has been averaging <131/90 in the last month.  Advised sickness/vomiting can cause an increase in BP readings.   She eats diet high in salt and explained salt can increase BP and cause fluid retention.    Patient has a PCP appt 10/8 and advised spouse to discuss BP readings at that appointment.  Patient has not had an initial visit with Dr Fletcher Anon and canceled the May appointment.  Advised to call the office to schedule an appointment with Dr Fletcher Anon.    Advised if patients symptoms worsen to call back or use ER if needed.  Will send to Dr Caryl Comes for review/recommendations if needed.    5/6 Remote transmission suggests possible dryness today which correlates with patient having vomiting episodes today.  Patient is able to eat and drink this afternoon.

## 2018-11-10 NOTE — Telephone Encounter (Signed)
See phone note by Sharman Cheek, RN today addressing BP/Vomiting.

## 2018-11-11 NOTE — Telephone Encounter (Signed)
Noted and agree. 

## 2018-11-16 ENCOUNTER — Other Ambulatory Visit: Payer: Self-pay | Admitting: Student

## 2018-11-16 NOTE — Telephone Encounter (Signed)
Please schedule overdue F/U. Thank you!

## 2018-12-01 ENCOUNTER — Ambulatory Visit: Payer: Medicare Other | Admitting: Internal Medicine

## 2018-12-07 DIAGNOSIS — M109 Gout, unspecified: Secondary | ICD-10-CM | POA: Diagnosis not present

## 2018-12-07 DIAGNOSIS — I10 Essential (primary) hypertension: Secondary | ICD-10-CM | POA: Diagnosis not present

## 2018-12-07 DIAGNOSIS — M545 Low back pain: Secondary | ICD-10-CM | POA: Diagnosis not present

## 2018-12-07 DIAGNOSIS — R413 Other amnesia: Secondary | ICD-10-CM | POA: Diagnosis not present

## 2018-12-07 DIAGNOSIS — E039 Hypothyroidism, unspecified: Secondary | ICD-10-CM | POA: Diagnosis not present

## 2018-12-07 DIAGNOSIS — Z23 Encounter for immunization: Secondary | ICD-10-CM | POA: Diagnosis not present

## 2018-12-07 DIAGNOSIS — Z79899 Other long term (current) drug therapy: Secondary | ICD-10-CM | POA: Diagnosis not present

## 2018-12-07 DIAGNOSIS — E1165 Type 2 diabetes mellitus with hyperglycemia: Secondary | ICD-10-CM | POA: Diagnosis not present

## 2018-12-14 ENCOUNTER — Ambulatory Visit (INDEPENDENT_AMBULATORY_CARE_PROVIDER_SITE_OTHER): Payer: Medicare Other

## 2018-12-14 DIAGNOSIS — Z95810 Presence of automatic (implantable) cardiac defibrillator: Secondary | ICD-10-CM | POA: Diagnosis not present

## 2018-12-14 DIAGNOSIS — I5022 Chronic systolic (congestive) heart failure: Secondary | ICD-10-CM | POA: Diagnosis not present

## 2018-12-16 NOTE — Progress Notes (Signed)
EPIC Encounter for ICM Monitoring  Patient Name: Stacy Grimes is a 68 y.o. female Date: 12/16/2018 Primary Care Physican: Cyndi Bender, PA-C Primary [place] Electrophysiologist: Caryl Comes Bi-V Pacing: 96% LastWeight: 198 lbs  Transmission reviewed.   Coruve thoracic impedancenormal.  Prescribed:Furosemide20 mg 1 tabletdaily.  Labs: 04/12/2018 Creatinine0.97, BUN10, Potassium4.1, Sodium137, GFR>60 04/11/2018 Creatinine1.17, BUN16, Potassium3.8, Sodium136, [id number]  04/10/2018 Creatinine1.37, BUN24, Potassium3.0, Sodium136, GFR40-46  04/09/2018 Creatinine1.44, BUN27, Potassium3.4, [id number], [id number] A complete set of results can be found in Results Review.  Recommendations: None.  Follow-up plan: ICM clinic phone appointment on 02/12/2019 since patient has office defib check on 01/12/2019.   Office appt 01/12/2019 with Dr. Caryl Comes and 12/16/2018 with Dr Fletcher Anon.    Copy of ICM check sent to Dr. Caryl Comes.   3 month ICM trend: 12/14/2018    1 Year ICM trend:       [name], RN 12/16/2018 12:04 PM

## 2018-12-21 DIAGNOSIS — L710 Perioral dermatitis: Secondary | ICD-10-CM | POA: Diagnosis not present

## 2018-12-21 DIAGNOSIS — Z9181 History of falling: Secondary | ICD-10-CM | POA: Diagnosis not present

## 2018-12-21 DIAGNOSIS — E785 Hyperlipidemia, unspecified: Secondary | ICD-10-CM | POA: Diagnosis not present

## 2018-12-21 DIAGNOSIS — Z0000 Encounter for general adult medical examination without abnormal findings: Secondary | ICD-10-CM | POA: Diagnosis not present

## 2018-12-21 DIAGNOSIS — N959 Unspecified menopausal and perimenopausal disorder: Secondary | ICD-10-CM | POA: Diagnosis not present

## 2018-12-21 DIAGNOSIS — Z1231 Encounter for screening mammogram for malignant neoplasm of breast: Secondary | ICD-10-CM | POA: Diagnosis not present

## 2018-12-21 DIAGNOSIS — Z6836 Body mass index (BMI) 36.0-36.9, adult: Secondary | ICD-10-CM | POA: Diagnosis not present

## 2018-12-21 DIAGNOSIS — Z1331 Encounter for screening for depression: Secondary | ICD-10-CM | POA: Diagnosis not present

## 2019-01-07 ENCOUNTER — Ambulatory Visit: Payer: Medicare Other

## 2019-01-07 LAB — CUP PACEART REMOTE DEVICE CHECK
Battery Remaining Longevity: 53 mo
Battery Remaining Percentage: 66 %
Battery Voltage: 2.95 V
Brady Statistic AP VP Percent: 1 %
Brady Statistic AP VS Percent: 1 %
Brady Statistic AS VP Percent: 96 %
Brady Statistic AS VS Percent: 3.9 %
Brady Statistic RA Percent Paced: 1 %
Date Time Interrogation Session: 20201203020018
HighPow Impedance: 57 Ohm
HighPow Impedance: 57 Ohm
Implantable Lead Implant Date: 20110309
Implantable Lead Implant Date: 20110309
Implantable Lead Implant Date: 20110309
Implantable Lead Location: 753858
Implantable Lead Location: 753859
Implantable Lead Location: 753860
Implantable Lead Model: 7121
Implantable Pulse Generator Implant Date: 20180814
Lead Channel Impedance Value: 360 Ohm
Lead Channel Impedance Value: 490 Ohm
Lead Channel Impedance Value: 630 Ohm
Lead Channel Pacing Threshold Amplitude: 0.5 V
Lead Channel Pacing Threshold Amplitude: 0.75 V
Lead Channel Pacing Threshold Amplitude: 1 V
Lead Channel Pacing Threshold Pulse Width: 0.5 ms
Lead Channel Pacing Threshold Pulse Width: 0.5 ms
Lead Channel Pacing Threshold Pulse Width: 0.5 ms
Lead Channel Sensing Intrinsic Amplitude: 11.7 mV
Lead Channel Sensing Intrinsic Amplitude: 5 mV
Lead Channel Setting Pacing Amplitude: 2 V
Lead Channel Setting Pacing Amplitude: 2.25 V
Lead Channel Setting Pacing Amplitude: 2.5 V
Lead Channel Setting Pacing Pulse Width: 0.5 ms
Lead Channel Setting Pacing Pulse Width: 0.5 ms
Lead Channel Setting Sensing Sensitivity: 0.5 mV
Pulse Gen Serial Number: 9761609

## 2019-01-12 ENCOUNTER — Encounter: Payer: Medicare Other | Admitting: Internal Medicine

## 2019-01-15 ENCOUNTER — Ambulatory Visit: Payer: Medicare Other | Admitting: Cardiovascular Disease

## 2019-02-22 NOTE — Progress Notes (Signed)
ICM Remote Transmission rescheduled from 02/22/2019 to 03/29/2019 since patient has office defib check scheduled with Dr Caryl Comes on 02/23/2019.

## 2019-02-23 ENCOUNTER — Encounter: Payer: Medicare Other | Admitting: Internal Medicine

## 2019-02-23 ENCOUNTER — Ambulatory Visit: Payer: Medicare Other | Admitting: Cardiovascular Disease

## 2019-03-29 ENCOUNTER — Ambulatory Visit (INDEPENDENT_AMBULATORY_CARE_PROVIDER_SITE_OTHER): Payer: Medicare Other

## 2019-03-29 DIAGNOSIS — I5022 Chronic systolic (congestive) heart failure: Secondary | ICD-10-CM

## 2019-03-29 DIAGNOSIS — Z95810 Presence of automatic (implantable) cardiac defibrillator: Secondary | ICD-10-CM | POA: Diagnosis not present

## 2019-03-31 ENCOUNTER — Telehealth: Payer: Self-pay

## 2019-03-31 NOTE — Progress Notes (Signed)
EPIC Encounter for ICM Monitoring  Patient Name: Stacy Grimes is a 69 y.o. female Date: 03/31/2019 Primary Care Physican: Cyndi Bender, PA-C Primary [place] Electrophysiologist: Caryl Comes Bi-V Pacing: 96% LastWeight: 198 lbs  Attempted call to husband, DPR.   Left detailed message per DPR regarding transmission. Transmission reviewed.   Coruve thoracic impedancenormal.    Prescribed:Furosemide20 mg 1 tabletdaily.  Labs: 04/12/2018 Creatinine0.97, BUN10, Potassium4.1, Sodium137, GFR>60 04/11/2018 Creatinine1.17, BUN16, Potassium3.8, Sodium136, [id number]  04/10/2018 Creatinine1.37, BUN24, Potassium3.0, Sodium136, GFR40-46  04/09/2018 Creatinine1.44, BUN27, Potassium3.4, [id number], [id number] A complete set of results can be found in Results Review.  Recommendations: Left voice mail with ICM number and encouraged to call if experiencing any fluid symptoms.  Follow-up plan: ICM clinic phone appointment on 05/03/2019.  91 day device clinic remote transmission scheduled 04/08/2019.  December and January appointments canceled with Dr Caryl Comes & Dr Fletcher Anon.   Copy of ICM check sent to Dr. Caryl Comes.   3 month ICM trend: 03/29/2019    1 Year ICM trend:       [name], RN 03/31/2019 9:34 AM

## 2019-03-31 NOTE — Telephone Encounter (Signed)
Remote ICM transmission received.  Attempted call to husband regarding ICM remote transmission and left detailed message per DPR.

## 2019-04-08 ENCOUNTER — Ambulatory Visit (INDEPENDENT_AMBULATORY_CARE_PROVIDER_SITE_OTHER): Payer: Medicare Other | Admitting: *Deleted

## 2019-04-08 DIAGNOSIS — I5022 Chronic systolic (congestive) heart failure: Secondary | ICD-10-CM

## 2019-04-08 LAB — CUP PACEART REMOTE DEVICE CHECK
Battery Remaining Longevity: 49 mo
Battery Remaining Percentage: 62 %
Battery Voltage: 2.95 V
Brady Statistic AP VP Percent: 1 %
Brady Statistic AP VS Percent: 1 %
Brady Statistic AS VP Percent: 96 %
Brady Statistic AS VS Percent: 3.8 %
Brady Statistic RA Percent Paced: 1 %
Date Time Interrogation Session: 20210304020016
HighPow Impedance: 58 Ohm
HighPow Impedance: 58 Ohm
Implantable Lead Implant Date: 20110309
Implantable Lead Implant Date: 20110309
Implantable Lead Implant Date: 20110309
Implantable Lead Location: 753858
Implantable Lead Location: 753859
Implantable Lead Location: 753860
Implantable Lead Model: 7121
Implantable Pulse Generator Implant Date: 20180814
Lead Channel Impedance Value: 360 Ohm
Lead Channel Impedance Value: 450 Ohm
Lead Channel Impedance Value: 640 Ohm
Lead Channel Pacing Threshold Amplitude: 0.5 V
Lead Channel Pacing Threshold Amplitude: 0.75 V
Lead Channel Pacing Threshold Amplitude: 0.875 V
Lead Channel Pacing Threshold Pulse Width: 0.5 ms
Lead Channel Pacing Threshold Pulse Width: 0.5 ms
Lead Channel Pacing Threshold Pulse Width: 0.5 ms
Lead Channel Sensing Intrinsic Amplitude: 11.7 mV
Lead Channel Sensing Intrinsic Amplitude: 5 mV
Lead Channel Setting Pacing Amplitude: 2 V
Lead Channel Setting Pacing Amplitude: 2.25 V
Lead Channel Setting Pacing Amplitude: 2.5 V
Lead Channel Setting Pacing Pulse Width: 0.5 ms
Lead Channel Setting Pacing Pulse Width: 0.5 ms
Lead Channel Setting Sensing Sensitivity: 0.5 mV
Pulse Gen Serial Number: 9761609

## 2019-04-08 NOTE — Progress Notes (Signed)
ICD Remote  

## 2019-04-20 ENCOUNTER — Ambulatory Visit: Payer: Medicare Other | Admitting: Cardiovascular Disease

## 2019-04-20 ENCOUNTER — Encounter: Payer: Medicare Other | Admitting: Internal Medicine

## 2019-05-03 ENCOUNTER — Ambulatory Visit (INDEPENDENT_AMBULATORY_CARE_PROVIDER_SITE_OTHER): Payer: Medicare Other

## 2019-05-03 DIAGNOSIS — I5022 Chronic systolic (congestive) heart failure: Secondary | ICD-10-CM

## 2019-05-03 DIAGNOSIS — Z95810 Presence of automatic (implantable) cardiac defibrillator: Secondary | ICD-10-CM

## 2019-05-05 NOTE — Progress Notes (Signed)
EPIC Encounter for ICM Monitoring  Patient Name: Stacy Grimes is a 69 y.o. female Date: 05/05/2019 Primary Care Physican: Cyndi Bender, PA-C Primary [employer] Electrophysiologist: Vergie Living Pacing: 96% 05/05/2019 Weight: 198 lbs  Spoke to husband, DPR.  He stated she is having right knee and neck area pain but other wise doing reasonably well.   Coruve thoracic impedancenormal.    Prescribed:Furosemide20 mg 1 tabletdaily.  Labs: 04/12/2018 Creatinine0.97, BUN10, Potassium4.1, Sodium137, GFR>60 04/11/2018 Creatinine1.17, BUN16, Potassium3.8, Sodium136, [id number]  04/10/2018 Creatinine1.37, BUN24, Potassium3.0, Sodium136, GFR40-46  04/09/2018 Creatinine1.44, BUN27, Potassium3.4, [id number], [id number] A complete set of results can be found in Results Review.  Recommendations:  No changes and encouraged to call if experiencing any fluid symptoms.  Follow-up plan: ICM clinic phone appointment on5/04/2019.  91 day device clinic remote transmission scheduled 07/08/2019.  Office visit 05/27/2019 with Dr Fletcher Anon. He will call office to reschedule OV with Dr Caryl Comes.  Copy of ICM check sent to [place].   3 month ICM trend: 05/03/2019    1 Year ICM trend:       [name], RN 05/05/2019 9:53 AM

## 2019-05-27 ENCOUNTER — Ambulatory Visit: Payer: Medicare Other | Admitting: Cardiovascular Disease

## 2019-06-07 ENCOUNTER — Ambulatory Visit (INDEPENDENT_AMBULATORY_CARE_PROVIDER_SITE_OTHER): Payer: Medicare Other

## 2019-06-07 DIAGNOSIS — Z95810 Presence of automatic (implantable) cardiac defibrillator: Secondary | ICD-10-CM | POA: Diagnosis not present

## 2019-06-07 DIAGNOSIS — I5022 Chronic systolic (congestive) heart failure: Secondary | ICD-10-CM

## 2019-06-08 ENCOUNTER — Telehealth: Payer: Self-pay

## 2019-06-08 NOTE — Progress Notes (Signed)
EPIC Encounter for ICM Monitoring  Patient Name: Stacy Grimes is a 69 y.o. female Date: 06/08/2019 Primary Care Physican: Cyndi Bender, PA-C Primary [place] Electrophysiologist: Vergie Living Pacing: 96% 05/05/2019 Weight: 198 lbs  AT/AF Burden <1% (taking Aspirin)  Attempted call to husband and unable to reach.   Transmission reviewed.   Coruve thoracic impedancenormal.   Prescribed:Furosemide20 mg 1 tabletby mouth as needed for up to 30 days.  Labs: 04/12/2018 Creatinine0.97, BUN10, Potassium4.1, Sodium137, GFR>60 04/11/2018 Creatinine1.17, BUN16, Potassium3.8, Sodium136, [id number]  04/10/2018 Creatinine1.37, BUN24, Potassium3.0, Sodium136, GFR40-46  04/09/2018 Creatinine1.44, BUN27, Potassium3.4, [id number], [id number] A complete set of results can be found in Results Review.  Recommendations: Unable to reach.    Follow-up plan: ICM clinic phone appointment on6/08/2019.91 day device clinic remote transmission scheduled 07/08/2019. Office visit 08/05/2019 with Dr Fletcher Anon. Last office visit with Dr Caryl Comes 12/2018  Copy of ICM check sent to [place].  3 month ICM trend: 06/08/2019    1 Year ICM trend:       [name], RN 06/08/2019 12:15 PM

## 2019-06-08 NOTE — Telephone Encounter (Signed)
Remote ICM transmission received.  Attempted call to husband regarding ICM remote transmission and no answer.

## 2019-07-01 DIAGNOSIS — R413 Other amnesia: Secondary | ICD-10-CM | POA: Diagnosis not present

## 2019-07-01 DIAGNOSIS — Z8679 Personal history of other diseases of the circulatory system: Secondary | ICD-10-CM | POA: Diagnosis not present

## 2019-07-01 DIAGNOSIS — F0390 Unspecified dementia without behavioral disturbance: Secondary | ICD-10-CM | POA: Diagnosis not present

## 2019-07-08 ENCOUNTER — Ambulatory Visit (INDEPENDENT_AMBULATORY_CARE_PROVIDER_SITE_OTHER): Payer: Medicare Other | Admitting: *Deleted

## 2019-07-08 DIAGNOSIS — I420 Dilated cardiomyopathy: Secondary | ICD-10-CM

## 2019-07-08 LAB — CUP PACEART REMOTE DEVICE CHECK
Battery Remaining Longevity: 46 mo
Battery Remaining Percentage: 59 %
Battery Voltage: 2.95 V
Brady Statistic AP VP Percent: 1 %
Brady Statistic AP VS Percent: 1 %
Brady Statistic AS VP Percent: 96 %
Brady Statistic AS VS Percent: 3.7 %
Brady Statistic RA Percent Paced: 1 %
Date Time Interrogation Session: 20210603020035
HighPow Impedance: 54 Ohm
HighPow Impedance: 54 Ohm
Implantable Lead Implant Date: 20110309
Implantable Lead Implant Date: 20110309
Implantable Lead Implant Date: 20110309
Implantable Lead Location: 753858
Implantable Lead Location: 753859
Implantable Lead Location: 753860
Implantable Lead Model: 7121
Implantable Pulse Generator Implant Date: 20180814
Lead Channel Impedance Value: 350 Ohm
Lead Channel Impedance Value: 480 Ohm
Lead Channel Impedance Value: 580 Ohm
Lead Channel Pacing Threshold Amplitude: 0.5 V
Lead Channel Pacing Threshold Amplitude: 0.75 V
Lead Channel Pacing Threshold Amplitude: 0.875 V
Lead Channel Pacing Threshold Pulse Width: 0.5 ms
Lead Channel Pacing Threshold Pulse Width: 0.5 ms
Lead Channel Pacing Threshold Pulse Width: 0.5 ms
Lead Channel Sensing Intrinsic Amplitude: 11.7 mV
Lead Channel Sensing Intrinsic Amplitude: 4.2 mV
Lead Channel Setting Pacing Amplitude: 2 V
Lead Channel Setting Pacing Amplitude: 2.25 V
Lead Channel Setting Pacing Amplitude: 2.5 V
Lead Channel Setting Pacing Pulse Width: 0.5 ms
Lead Channel Setting Pacing Pulse Width: 0.5 ms
Lead Channel Setting Sensing Sensitivity: 0.5 mV
Pulse Gen Serial Number: 9761609

## 2019-07-12 ENCOUNTER — Ambulatory Visit (INDEPENDENT_AMBULATORY_CARE_PROVIDER_SITE_OTHER): Payer: Medicare Other

## 2019-07-12 DIAGNOSIS — I5022 Chronic systolic (congestive) heart failure: Secondary | ICD-10-CM | POA: Diagnosis not present

## 2019-07-12 DIAGNOSIS — Z95810 Presence of automatic (implantable) cardiac defibrillator: Secondary | ICD-10-CM

## 2019-07-13 NOTE — Progress Notes (Signed)
Remote ICD transmission.   

## 2019-07-14 ENCOUNTER — Telehealth: Payer: Self-pay

## 2019-07-14 NOTE — Progress Notes (Signed)
EPIC Encounter for ICM Monitoring  Patient Name: Stacy Grimes is a 69 y.o. female Date: 07/14/2019 Primary Care Physican: Cyndi Bender, PA-C Primary [place] Electrophysiologist: Vergie Living Pacing: 96% 3/31/2021Weight: 198 lbs  AT/AF Burden <1% (taking Aspirin)  Attempted call to patient and unable to reach.   Transmission reviewed.   Coruve thoracic impedancenormal.   Prescribed:Furosemide20 mg 1 tabletby mouth as needed for up to 30 days.  Labs: 04/12/2018 Creatinine0.97, BUN10, Potassium4.1, Sodium137, GFR>60 04/11/2018 Creatinine1.17, BUN16, Potassium3.8, Sodium136, [id number]  04/10/2018 Creatinine1.37, BUN24, Potassium3.0, Sodium136, GFR40-46  04/09/2018 Creatinine1.44, BUN27, Potassium3.4, [id number], [id number] A complete set of results can be found in Results Review.  Recommendations:Unable to reach.    Follow-up plan: ICM clinic phone appointment on7/01/2020.91 day device clinic remote transmission scheduled9/03/2019.Office visit 7/1/2021with Dr Fletcher Anon. Last office visit with Dr Caryl Comes 12/2018  Copy of ICM check sent to [place].  3 month ICM trend: 07/12/2019    1 Year ICM trend:       [name], RN 07/14/2019 8:10 AM

## 2019-07-14 NOTE — Telephone Encounter (Signed)
Remote ICM transmission received.  Attempted call to husband regarding ICM remote transmission and no answer or answering machine.

## 2019-08-05 ENCOUNTER — Ambulatory Visit: Payer: Medicare Other | Admitting: Cardiovascular Disease

## 2019-08-13 ENCOUNTER — Ambulatory Visit: Payer: Medicare Other | Admitting: Family

## 2019-08-16 ENCOUNTER — Ambulatory Visit (INDEPENDENT_AMBULATORY_CARE_PROVIDER_SITE_OTHER): Payer: Medicare Other

## 2019-08-16 DIAGNOSIS — Z95810 Presence of automatic (implantable) cardiac defibrillator: Secondary | ICD-10-CM | POA: Diagnosis not present

## 2019-08-16 DIAGNOSIS — I5022 Chronic systolic (congestive) heart failure: Secondary | ICD-10-CM | POA: Diagnosis not present

## 2019-08-17 NOTE — Progress Notes (Signed)
EPIC Encounter for ICM Monitoring  Patient Name: PAMILA MENDIBLES is a 69 y.o. female Date: 08/17/2019 Primary Care Physican: Cyndi Bender, PA-C Primary [place] Electrophysiologist: Vergie Living Pacing: 96% 3/31/2021Weight: 198 lbs  AT/AF Burden <1% (taking Aspirin)  Transmission reviewed.   Coruve thoracic impedancenormal.   Prescribed:Furosemide20 mg 1 tabletby mouth as needed for up to 30 days.  Labs: 04/12/2018 Creatinine0.97, BUN10, Potassium4.1, Sodium137, GFR>60 04/11/2018 Creatinine1.17, BUN16, Potassium3.8, Sodium136, [id number]  04/10/2018 Creatinine1.37, BUN24, Potassium3.0, Sodium136, GFR40-46  04/09/2018 Creatinine1.44, BUN27, Potassium3.4, [id number], [id number] A complete set of results can be found in Results Review.  Recommendations:None.    Follow-up plan: ICM clinic phone appointment on8/16/2021.91 day device clinic remote transmission scheduled9/03/2019.  EP/Cardiology Office Visits:  01/18/2020 with Dr Fletcher Anon  Last office visit was  04/11/2018 with Ignacia Bayley, NP and 07/16/2017 with Dr Caryl Comes.  04/2019 appointment canceled with Dr Caryl Comes and March, April May and July appointments canceled with Dr Fletcher Anon.  Copy of ICM check sent to Dr. Caryl Comes.   3 month ICM trend: 08/16/2019    1 Year ICM trend:       [name], RN 08/17/2019 2:37 PM

## 2019-08-19 ENCOUNTER — Telehealth: Payer: Self-pay

## 2019-08-19 NOTE — Telephone Encounter (Signed)
Called lmom informing patient of appointment on 08/23/2019. klh

## 2019-08-20 ENCOUNTER — Telehealth: Payer: Self-pay

## 2019-08-20 NOTE — Telephone Encounter (Signed)
Confirmed appointment on 08/23/2019. klh

## 2019-08-23 ENCOUNTER — Ambulatory Visit: Payer: Medicare Other | Admitting: Internal Medicine

## 2019-08-30 ENCOUNTER — Telehealth: Payer: Self-pay

## 2019-08-30 NOTE — Telephone Encounter (Signed)
Called lmom informing patient of appointment on 09/01/2019. klh

## 2019-08-30 NOTE — Telephone Encounter (Signed)
Confirmed appointment on 09/01/2019 and screened for covid. klh 

## 2019-09-01 ENCOUNTER — Other Ambulatory Visit: Payer: Self-pay

## 2019-09-01 ENCOUNTER — Ambulatory Visit (INDEPENDENT_AMBULATORY_CARE_PROVIDER_SITE_OTHER): Payer: Medicare Other | Admitting: Internal Medicine

## 2019-09-01 ENCOUNTER — Encounter: Payer: Self-pay | Admitting: Internal Medicine

## 2019-09-01 VITALS — BP 120/70 | HR 104 | Temp 97.2°F | Resp 16 | Ht 63.0 in | Wt 203.4 lb

## 2019-09-01 DIAGNOSIS — I5022 Chronic systolic (congestive) heart failure: Secondary | ICD-10-CM | POA: Diagnosis not present

## 2019-09-01 DIAGNOSIS — J449 Chronic obstructive pulmonary disease, unspecified: Secondary | ICD-10-CM

## 2019-09-01 DIAGNOSIS — Z9981 Dependence on supplemental oxygen: Secondary | ICD-10-CM | POA: Diagnosis not present

## 2019-09-01 DIAGNOSIS — G4733 Obstructive sleep apnea (adult) (pediatric): Secondary | ICD-10-CM | POA: Diagnosis not present

## 2019-09-01 DIAGNOSIS — Z9989 Dependence on other enabling machines and devices: Secondary | ICD-10-CM

## 2019-09-01 DIAGNOSIS — J9611 Chronic respiratory failure with hypoxia: Secondary | ICD-10-CM

## 2019-09-01 NOTE — Progress Notes (Signed)
[facility] [place], [place] [address]  Pulmonary Sleep Medicine   Office Visit Note  Patient Name: Stacy Grimes DOB: [birth year] MRN [id number]  Date of Service: 09/01/2019  Complaints/HPI: PT is here to reestablish care.  She reports [employer] called her and reported it was time to renew her certification for oxygen so that insurance will continue to pay for oxygen.  She currently uses 3 liters of oxygen during the day, and 4lpm at night. She has a history of Chronic Respiratory failure, heart failure, and osa.  She has been on oxygen continually since 2010.  Today on room air at rest she has an oxygen saturation of 87-88%.    ROS  General: (-) fever, (-) chills, (-) night sweats, (-) weakness Skin: (-) rashes, (-) itching,. Eyes: (-) visual changes, (-) redness, (-) itching. Nose and Sinuses: (-) nasal stuffiness or itchiness, (-) postnasal drip, (-) nosebleeds, (-) sinus trouble. Mouth and Throat: (-) sore throat, (-) hoarseness. Neck: (-) swollen glands, (-) enlarged thyroid, (-) neck pain. Respiratory: + cough, (-) bloody sputum, + shortness of breath, + wheezing. Cardiovascular: - ankle swelling, (-) chest pain. Lymphatic: (-) lymph node enlargement. Neurologic: (-) numbness, (-) tingling. Psychiatric: (-) anxiety, (-) depression   Current Medication: Outpatient Encounter Medications as of 09/01/2019  Medication Sig  . albuterol (ACCUNEB) 0.63 MG/3ML nebulizer solution Take 1 ampule by nebulization every 6 (six) hours as needed for wheezing.  Marland Kitchen albuterol (PROVENTIL HFA;VENTOLIN HFA) 108 (90 Base) MCG/ACT inhaler Inhale 2 puffs into the lungs every 6 (six) hours as needed.  Marland Kitchen allopurinol (ZYLOPRIM) 300 MG tablet Take 1 tablet (300 mg total) by mouth daily.  Marland Kitchen aspirin 81 MG EC tablet Take 81 mg by mouth daily.    . budesonide-formoterol (SYMBICORT) 160-4.5 MCG/ACT inhaler Inhale 2 puffs into the lungs 2 (two) times daily.  . digoxin (LANOXIN) 0.125  MG tablet TAKE 1 TABLET BY MOUTH EVERY 3 DAYS  . donepezil (ARICEPT) 5 MG tablet Take 5 mg by mouth at bedtime.  . gabapentin (NEURONTIN) 300 MG capsule Take 300 mg by mouth 2 (two) times daily.  . hydrOXYzine (ATARAX/VISTARIL) 25 MG tablet Take 25 mg by mouth at bedtime.  Marland Kitchen levothyroxine (EUTHYROX) 112 MCG tablet Take 112 mcg by mouth daily before breakfast.  . Menthol, Topical Analgesic, (BENGAY EX) Apply 1 application topically daily as needed (back pain).  . metFORMIN (GLUCOPHAGE-XR) 500 MG 24 hr tablet Take 500 mg by mouth every morning.   . metoprolol succinate (TOPROL-XL) 25 MG 24 hr tablet Take 25 mg by mouth daily.  Marland Kitchen omeprazole (PRILOSEC) 20 MG capsule Take 1 capsule (20 mg total) by mouth every morning.  . Sennosides (EX-LAX PO) Take 2 tablets by mouth at bedtime as needed (constipation).  Marland Kitchen tiotropium (SPIRIVA) 18 MCG inhalation capsule Place 1 capsule (18 mcg total) into inhaler and inhale daily as needed (shortness of breath).  . venlafaxine (EFFEXOR) 75 MG tablet Take 75 mg by mouth 2 (two) times daily.  . vitamin B-12 (CYANOCOBALAMIN) 1000 MCG tablet Take 1,000 mcg by mouth 2 (two) times daily.   . furosemide (LASIX) 20 MG tablet Take 1 tablet (20 mg total) by mouth daily as needed for up to 30 days for fluid or edema.  Marland Kitchen levothyroxine (SYNTHROID, LEVOTHROID) 112 MCG tablet Take 112 mcg by mouth daily. (Patient not taking: Reported on 09/01/2019)  . Melatonin 10 MG TABS Take 10 mg by mouth at bedtime as needed (sleep). (Patient not taking: Reported  on 09/01/2019)   No facility-administered encounter medications on file as of 09/01/2019.    Surgical History: Past Surgical History:  Procedure Laterality Date  . ABDOMINAL HYSTERECTOMY    . BIV ICD GENERATOR CHANGEOUT N/A 09/17/2016   Procedure: BiV ICD Generator Changeout;  Surgeon: Deboraha Sprang, MD;  Location: [facility];  Service: Cardiovascular;  Laterality: N/A;  . BLADDER SURGERY    . CARDIAC CATHETERIZATION    .  CARDIAC DEFIBRILLATOR PLACEMENT  4 yrs ago   pacemaker  . CATARACT EXTRACTION W/PHACO Left 09/01/2014   Procedure: CATARACT EXTRACTION PHACO AND INTRAOCULAR LENS PLACEMENT (IOC);  Surgeon: Lyla Glassing, MD;  Location: [facility];  Service: Ophthalmology;  Laterality: Left;  [place]: 1:12.1   . CATARACT EXTRACTION W/PHACO Right 09/29/2014   Procedure: CATARACT EXTRACTION PHACO AND INTRAOCULAR LENS PLACEMENT (IOC);  Surgeon: Lyla Glassing, MD;  Location: [facility];  Service: Ophthalmology;  Laterality: Right;  [place]: 00:52.7   . CHOLECYSTECTOMY    . COLON SURGERY    . COLONOSCOPY    . COLONOSCOPY N/A 08/31/2012   Procedure: COLONOSCOPY;  Surgeon: Inda Castle, MD;  Location: [facility];  Service: Endoscopy;  Laterality: N/A;  . ESOPHAGOGASTRODUODENOSCOPY N/A 07/06/2012   Procedure: ESOPHAGOGASTRODUODENOSCOPY (EGD);  Surgeon: Lafayette Dragon, MD;  Location: [facility];  Service: Endoscopy;  Laterality: N/A;  . KNEE SURGERY Right   . MIDDLE EAR SURGERY    . SAVORY DILATION N/A 07/06/2012   Procedure: SAVORY DILATION;  Surgeon: Lafayette Dragon, MD;  Location: [facility];  Service: Endoscopy;  Laterality: N/A;  . WRIST SURGERY Right     Medical History: Past Medical History:  Diagnosis Date  . AAA (abdominal aortic aneurysm) ([place])   . Anginal pain ([place])   . Anxiety   . Arthritis   . Automatic implantable cardioverter-defibrillator in situ   . Biventricular ICD  [employer]   . COPD (chronic obstructive pulmonary disease) ([place])   . Deafness    left ear  . Dementia ([place])   . Depression   . Dizziness   . Dysrhythmia   . Fracture    history of spinal fracture  . GERD (gastroesophageal reflux disease)   . HFrEF (heart failure with reduced ejection fraction) ([place])    a. 2010 Cath: nonobs dzs, EF 15-20%; b. 09/2017 Echo: EF suboptimal. EF low nl; c. 04/2018 Echo: EF 40-45%.  . Hypertension   . Hypothyroidism   . Non-ischemic cardiomyopathy ([place])    a. 2010 Cath: nonobs dzs, EF 15-20%; b. 2011 s/p  SJM CRT-D; c. 09/2016 s/p Gen change; d. 09/2017 Echo: EF suboptimal. EF low nl; e. 04/2018 Echo: EF 40-45%.  . Oxygen dependent   . Palpitations   . Shortness of breath dyspnea   . Sleep apnea   . Wheezing     Family History: Family History  Problem Relation Age of Onset  . Hypertension Mother   . Breast cancer Mother   . Stroke Mother   . Heart disease Father   . Heart attack Sister   . Hypertension Brother   . Heart attack Sister   . Colon cancer Brother 78    Social History: Social History   Socioeconomic History  . Marital status: Married    Spouse name: Not on file  . Number of children: 3  . Years of education: Not on file  . Highest education level: Not on file  Occupational History  . Occupation: Retired    Fish farm manager: DISABLED  Tobacco Use  .  Smoking status: Former Smoker    Packs/day: 3.00    Years: 20.00    Pack years: 60.00    Types: Cigarettes    Quit date: 02/04/2002    Years since quitting: 17.5  . Smokeless tobacco: Never Used  Substance and Sexual Activity  . Alcohol use: No  . Drug use: No  . Sexual activity: Not on file  Other Topics Concern  . Not on file  Social History Narrative  . Not on file   Social Determinants of Health   Financial Resource Strain:   . Difficulty of Paying Living Expenses:   Food Insecurity:   . Worried About Charity fundraiser in the Last Year:   . Arboriculturist in the Last Year:   Transportation Needs:   . Film/video editor (Medical):   Marland Kitchen Lack of Transportation (Non-Medical):   Physical Activity:   . Days of Exercise per Week:   . Minutes of Exercise per Session:   Stress:   . Feeling of Stress :   Social Connections:   . Frequency of Communication with Friends and Family:   . Frequency of Social Gatherings with Friends and Family:   . Attends Religious Services:   . Active Member of Clubs or Organizations:   . Attends Archivist Meetings:   Marland Kitchen Marital Status:   Intimate Partner Violence:    . Fear of Current or Ex-Partner:   . Emotionally Abused:   Marland Kitchen Physically Abused:   . Sexually Abused:     Vital Signs: Blood pressure 120/70, pulse 104, temperature (!) 97.2 F (36.2 C), resp. rate 16, height 5\' 3"  (1.6 m), weight (!) 203 lb 6.4 oz (92.3 kg), SpO2 94 %.  Examination: General Appearance: The patient is well-developed, well-nourished, and in no distress. Skin: Gross inspection of skin unremarkable. Head: normocephalic, no gross deformities. Eyes: no gross deformities noted. ENT: ears appear grossly normal no exudates. Neck: Supple. No thyromegaly. No LAD. Respiratory: clear but diminished in bases. Cardiovascular: Normal S1 and S2 without murmur or rub. Extremities: No cyanosis. pulses are equal. Neurologic: Alert and oriented. No involuntary movements.  LABS: Recent Results (from the past 2160 hour(s))  CUP PACEART REMOTE DEVICE CHECK     Status: None   Collection Time: 07/08/19  2:00 AM  Result Value Ref Range   Date Time Interrogation Session [id number]    Pulse Generator Manufacturer [employer]    Pulse Gen Model 3357-40C Unify Assura    Pulse Gen Serial Number [id number]    Clinic Name [facility] Pulse Generator Type Cardiac Resynch Therapy Defibulator    Implantable Pulse Generator Implant Date [id number]    Implantable Lead Manufacturer [facility]    Implantable Lead Model 1258T QuickFlex     Implantable Lead Serial Number [id number]    Implantable Lead Implant Date [id number]    Implantable Lead Location Detail 1 Lateral Wall    Implantable Lead Location [id number]    Implantable Lead Manufacturer [facility]    Implantable Lead Model [id number] Tendril STS    Implantable Lead Serial Number [id number]    Implantable Lead Implant Date [id number]    Implantable Lead Location Detail 1 APPENDAGE    Implantable Lead Location [id number]    Implantable Lead Manufacturer [facility]    Implantable Lead Model [id number] Durata    Implantable Lead Serial Number [id number]     Implantable Lead Implant Date [id number]    Implantable Lead Location Detail 1 APEX  Implantable Lead Location [phone]    Lead Channel Setting Sensing Sensitivity 0.5 mV   Lead Channel Setting Sensing Adaptation Mode Adaptive Sensing    Lead Channel Setting Pacing Amplitude 2.25 V   Lead Channel Setting Pacing Pulse Width 0.5 ms   Lead Channel Setting Pacing Amplitude 2.5 V   Lead Channel Setting Pacing Pulse Width 0.5 ms   Lead Channel Setting Pacing Amplitude 2.0 V   Lead Channel Setting Pacing Capture Mode Fixed Pacing    Lead Channel Status NULL    Lead Channel Impedance Value 580 ohm   Lead Channel Pacing Threshold Amplitude 0.5 V   Lead Channel Pacing Threshold Pulse Width 0.5 ms   Lead Channel Status NULL    Lead Channel Impedance Value 350 ohm   Lead Channel Sensing Intrinsic Amplitude 4.2 mV   Lead Channel Pacing Threshold Amplitude 0.875 V   Lead Channel Pacing Threshold Pulse Width 0.5 ms   Lead Channel Status NULL    Lead Channel Impedance Value 480 ohm   Lead Channel Sensing Intrinsic Amplitude 11.7 mV   Lead Channel Pacing Threshold Amplitude 0.75 V   Lead Channel Pacing Threshold Pulse Width 0.5 ms   HighPow Impedance 54 ohm   HighPow Impedance 54 ohm   HighPow Imped Status NULL    HighPow Imped Status NULL    Battery Status MOS    Battery Remaining Longevity 46 mo   Battery Remaining Percentage 59.0 %   Battery Voltage 2.95 V   Brady Statistic RA Percent Paced 1.0 %   Brady Statistic AP VP Percent 1.0 %   Brady Statistic AS VP Percent 96.0 %   Brady Statistic AP VS Percent 1.0 %   Brady Statistic AS VS Percent 3.7 %    Radiology: ECHOCARDIOGRAM COMPLETE  Result Date: 04/10/2018   ECHOCARDIOGRAM REPORT   Patient Name:   JASANI LENGEL [facility] Date of Exam: 04/10/2018 Medical Rec #:  [id number]        Height:       63.0 in Accession #:    [id number]       Weight:       176.9 lb Date of Birth:  Aug 12, 1950         BSA:          1.84 m Patient Age:    81 years         BP:            80/52 mmHg Patient Gender: F                HR:           93 bpm. Exam Location:  [facility]  Procedure: 2D Echo, Cardiac Doppler, Color Doppler and Intracardiac            Opacification Agent Indications:     R55 Syncope  History:         Patient has prior history of Echocardiogram examinations.                  Non-ischemic cardiomyopathy and CHF, Defibrillator, COPD,                  arrhythmia; Risk Factors: Hypertension.  Sonographer:     Charmayne Sheer RDCS (AE) Referring Phys:  [id number] DAVID WILLIS Diagnosing Phys: Nelva Bush MD  Sonographer Comments: No apical window. IMPRESSIONS  1. Stage 1: 1: Basal and mid anterior septum is abnormal.  2. The left ventricle has mild-moderately reduced systolic function, with  an ejection fraction of 40-45%. The cavity size was normal. Left ventricular diastology could not be evaluated.  3. The right ventricle has normal systolic function. The cavity was mildly enlarged. There is mildly increased right ventricular wall thickness. Right ventricular systolic pressure could not be assessed.  4. Trivial pericardial effusion is present.  5. The mitral valve is normal in structure.  6. The aortic valve is tricuspid.  7. The aortic root is normal in size and structure.  8. The interatrial septum was not well visualized. FINDINGS  Left Ventricle: The left ventricle has mild-moderately reduced systolic function, with an ejection fraction of 40-45%. The cavity size was normal. There is no increase in left ventricular wall thickness. Left ventricular diastology could not be evaluated Definity contrast agent was given IV to delineate the left ventricular endocardial borders.  The basal and mid anterior septum is dyskinetic. All remaining scored  segments are normal. Right Ventricle: The right ventricle has normal systolic function. The cavity was mildly enlarged. There is mildly increased right ventricular wall thickness. Right ventricular systolic pressure could not be assessed.  Pacing wire/catheter visualized in the right ventricle. Left Atrium: left atrial size was normal in size Right Atrium: right atrial size was not well visualized. Right atrial pressure is estimated at 3 mmHg. Interatrial Septum: The interatrial septum was not well visualized. Pericardium: Trivial pericardial effusion is present. Mitral Valve: The mitral valve is normal in structure. Mitral valve regurgitation is not visualized by color flow Doppler. Tricuspid Valve: The tricuspid valve is not well visualized. Tricuspid valve regurgitation was not visualized by color flow Doppler. Aortic Valve: The aortic valve is tricuspid Aortic valve regurgitation was not visualized by color flow Doppler. There is no evidence of aortic valve stenosis. Pulmonic Valve: The pulmonic valve was not well visualized. Pulmonic valve regurgitation is not visualized by color flow Doppler. No evidence of pulmonic stenosis. Aorta: The aortic root is normal in size and structure. Pulmonary Artery: The pulmonary artery is not well seen. Venous: The inferior vena cava is normal in size with greater than 50% respiratory variability.  LEFT VENTRICLE PLAX 2D (Teich) LV EF:          19.9 %   Diastology LVIDd:          4.35 cm  LV e' lateral:   5.44 cm/s LVIDs:          3.96 cm  LV E/e' lateral: 18.5 LV PW:          0.95 cm  LV e' medial:    5.55 cm/s LV IVS:         0.69 cm  LV E/e' medial:  18.1 LVOT diam:      1.80 cm LV SV:          17 ml LVOT Area:      2.54 cm LEFT ATRIUM           Index      RIGHT ATRIUM LA diam:      3.10 cm 1.69 cm/m RA Pressure: 3 mmHg LA Vol (A4C): 17.4 ml 9.48 ml/m  AORTIC VALVE                   PULMONIC VALVE AV Area (Vmax):    1.51 cm    PV Vmax:       1.10 m/s AV Area (Vmean):   1.74 cm    PV Vmean:      81.800 cm/s AV Area (VTI):  1.71 cm    PV VTI:        0.212 m AV Vmax:           147.00 cm/s PV Peak grad:  4.8 mmHg AV Vmean:          86.900 cm/s PV Mean grad:  3.0 mmHg AV VTI:            0.179 m AV Peak  Grad:      8.6 mmHg AV Mean Grad:      4.0 mmHg LVOT Vmax:         87.20 cm/s LVOT Vmean:        59.300 cm/s LVOT VTI:          0.120 m LVOT/AV VTI ratio: 0.67  AORTA Ao Root diam: 2.70 cm MITRAL VALVE MV Area (PHT): 4.02 cm MV Peak grad:  4.5 mmHg MV Mean grad:  3.0 mmHg MV Vmax:       1.06 m/s MV Vmean:      74.4 cm/s MV VTI:        0.16 m MV PHT:        54.66 msec MV Decel Time: 189 msec MV E velocity: 100.50 cm/s  Nelva Bush MD Electronically signed by Nelva Bush MD Signature Date/Time: 04/10/2018/1:48:36 PM    Final     No results found.  No results found.    Assessment and Plan: Patient Active Problem List   Diagnosis Date Noted  . Dehydration 04/12/2018  . Syncope 04/09/2018  . 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 Diverticulosis of colon (without mention of hemorrhage) 08/31/2012  . Fatigue 08/10/2012  . Personal history of colonic polyps 07/02/2012  . Dysphagia, unspecified(787.20) 07/02/2012  . Unspecified constipation 07/02/2012  . 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 HYPERTENSION, BENIGN 03/24/2009  . SLEEP APNEA, OBSTRUCTIVE 03/23/2009  . Dilated cardiomyopathy ([place]) 03/23/2009  . PALPITATIONS 03/23/2009  . MURMUR 03/23/2009  . COUGH 03/23/2009   1. Chronic obstructive pulmonary disease, unspecified COPD type ([place]) Continue with current mgmt, trial of breztri given to patient.   2. OSA on CPAP Continue to use cpap as prescribed.   3. Chronic respiratory failure with hypoxia ([place]) Breztri samples given for trial.  Continue with oxygen as discussed.  - For home use only DME oxygen  4. Oxygen dependent - For home use only DME oxygen  5. Chronic systolic heart failure ([place]) Continue to see cardiology as before.    General Counseling: I have discussed the findings of the evaluation and examination with Juliann Pulse.  I have also discussed any further diagnostic evaluation thatmay be needed or ordered today. Darrelyn  verbalizes understanding of the findings of todays visit. We also reviewed her medications today and discussed drug interactions and side effects including but not limited excessive drowsiness and altered mental states. We also discussed that there is always a risk not just to her but also people around her. she has been encouraged to call the office with any questions or concerns that should arise related to todays visit.  No orders of the defined types were placed in this encounter.    Time spent: 30 This patient was seen by Orson Gear AGNP-C in Collaboration with Dr. Devona Konig as a part of collaborative care agreement.   I have personally obtained a history, examined the patient, evaluated laboratory and imaging results, formulated the assessment and plan and placed orders.    Allyne Gee, MD [facility] Pulmonary and Critical Care Sleep medicine

## 2019-09-20 ENCOUNTER — Ambulatory Visit (INDEPENDENT_AMBULATORY_CARE_PROVIDER_SITE_OTHER): Payer: Medicare Other

## 2019-09-20 DIAGNOSIS — I5022 Chronic systolic (congestive) heart failure: Secondary | ICD-10-CM

## 2019-09-20 DIAGNOSIS — Z95810 Presence of automatic (implantable) cardiac defibrillator: Secondary | ICD-10-CM | POA: Diagnosis not present

## 2019-09-21 NOTE — Progress Notes (Signed)
EPIC Encounter for ICM Monitoring  Patient Name: Stacy Grimes is a 69 y.o. female Date: 09/21/2019 Primary Care Physican: Cyndi Bender, PA-C Primary [place] Electrophysiologist: Vergie Living Pacing: 96% 8/172021 Weight: 205 lbs  AT/AF Burden <1% (taking Aspirin)  Spoke with husband and denies patient has any fluid symptoms.  Coruve thoracic impedancenormal.   Prescribed:Furosemide20 mg 1 tabletby mouth as needed for up to 30 days.  Labs: 04/12/2018 Creatinine0.97, BUN10, Potassium4.1, Sodium137, GFR>60 04/11/2018 Creatinine1.17, BUN16, Potassium3.8, Sodium136, TRV20-23  04/10/2018 Creatinine1.37, BUN24, Potassium3.0, Sodium136, GFR40-46  04/09/2018 Creatinine1.44, BUN27, Potassium3.4, [id number], [id number] A complete set of results can be found in Results Review.  Recommendations:No changes and encouraged to call if pt experiencing any fluid symptoms.  Follow-up plan: ICM clinic phone appointment on 10/25/2019.91 day device clinic remote transmission scheduled9/03/2019.  EP/Cardiology Office Visits:  01/18/2020 with Dr Fletcher Anon  Last office visit was  04/11/2018 with Ignacia Bayley, NP and 07/16/2017 with Dr Caryl Comes.  Canceled appts with Dr Caryl Comes 02/23/19 & 04/20/19.  Other appts canceled were March, April. May and July appointments with Dr Fletcher Anon and Gilford Rile, NP.  Copy of ICM check sent to Dr. Caryl Comes.   3 month ICM trend: 09/20/2019    1 Year ICM trend:       [name], RN 09/21/2019 4:58 PM

## 2019-09-22 ENCOUNTER — Ambulatory Visit: Payer: Medicare Other | Admitting: Internal Medicine

## 2019-10-06 ENCOUNTER — Telehealth: Payer: Self-pay

## 2019-10-06 ENCOUNTER — Ambulatory Visit: Payer: Medicare Other | Admitting: Internal Medicine

## 2019-10-06 NOTE — Telephone Encounter (Signed)
Billed same day cancel fee for pft.

## 2019-10-07 ENCOUNTER — Ambulatory Visit (INDEPENDENT_AMBULATORY_CARE_PROVIDER_SITE_OTHER): Payer: Medicare Other | Admitting: *Deleted

## 2019-10-07 DIAGNOSIS — I428 Other cardiomyopathies: Secondary | ICD-10-CM

## 2019-10-08 LAB — CUP PACEART REMOTE DEVICE CHECK
Battery Remaining Longevity: 45 mo
Battery Remaining Percentage: 55 %
Battery Voltage: 2.95 V
Brady Statistic AP VP Percent: 1 %
Brady Statistic AP VS Percent: 1 %
Brady Statistic AS VP Percent: 96 %
Brady Statistic AS VS Percent: 3.6 %
Brady Statistic RA Percent Paced: 1 %
Date Time Interrogation Session: 20210902020018
HighPow Impedance: 59 Ohm
HighPow Impedance: 59 Ohm
Implantable Lead Implant Date: 20110309
Implantable Lead Implant Date: 20110309
Implantable Lead Implant Date: 20110309
Implantable Lead Location: 753858
Implantable Lead Location: 753859
Implantable Lead Location: 753860
Implantable Lead Model: 7121
Implantable Pulse Generator Implant Date: 20180814
Lead Channel Impedance Value: 380 Ohm
Lead Channel Impedance Value: 530 Ohm
Lead Channel Impedance Value: 640 Ohm
Lead Channel Pacing Threshold Amplitude: 0.5 V
Lead Channel Pacing Threshold Amplitude: 0.75 V
Lead Channel Pacing Threshold Amplitude: 1 V
Lead Channel Pacing Threshold Pulse Width: 0.5 ms
Lead Channel Pacing Threshold Pulse Width: 0.5 ms
Lead Channel Pacing Threshold Pulse Width: 0.5 ms
Lead Channel Sensing Intrinsic Amplitude: 11.7 mV
Lead Channel Sensing Intrinsic Amplitude: 5 mV
Lead Channel Setting Pacing Amplitude: 2 V
Lead Channel Setting Pacing Amplitude: 2.25 V
Lead Channel Setting Pacing Amplitude: 2.5 V
Lead Channel Setting Pacing Pulse Width: 0.5 ms
Lead Channel Setting Pacing Pulse Width: 0.5 ms
Lead Channel Setting Sensing Sensitivity: 0.5 mV
Pulse Gen Serial Number: 9761609

## 2019-10-12 NOTE — Progress Notes (Signed)
Remote ICD transmission.   

## 2019-10-25 ENCOUNTER — Ambulatory Visit (INDEPENDENT_AMBULATORY_CARE_PROVIDER_SITE_OTHER): Payer: Medicare Other

## 2019-10-25 DIAGNOSIS — I5022 Chronic systolic (congestive) heart failure: Secondary | ICD-10-CM

## 2019-10-25 DIAGNOSIS — Z95810 Presence of automatic (implantable) cardiac defibrillator: Secondary | ICD-10-CM | POA: Diagnosis not present

## 2019-10-25 NOTE — Progress Notes (Signed)
EPIC Encounter for ICM Monitoring  Patient Name: Stacy Grimes is a 69 y.o. female Date: 10/25/2019 Primary Care Physican: Cyndi Bender, PA-C Primary [place] Electrophysiologist: Vergie Living Pacing: 96% 8/172021 Weight: 205 lbs  AT/AF Burden <1% (taking Aspirin)  Spoke with husband and denies patient has any fluid symptoms.  Coruve thoracic impedance suggesting possible fluid accumulation since 10/13/2019.   Prescribed:Furosemide20 mg 1 tabletby mouth as needed for up to 30 days.      Labs: 04/12/2018 Creatinine0.97, BUN10, Potassium4.1, Sodium137, GFR>60 04/11/2018 Creatinine1.17, BUN16, Potassium3.8, Sodium136, JFH54-56  04/10/2018 Creatinine1.37, BUN24, Potassium3.0, Sodium136, GFR40-46  04/09/2018 Creatinine1.44, BUN27, Potassium3.4, [id number], [id number] A complete set of results can be found in Results Review.  Recommendations:Advised to take 1-2 days of PRN Furosemide and limit salt intake   Follow-up plan: ICM clinic phone appointment on 10/25/2019.91 day device clinic remote transmission scheduled9/03/2019.  EP/Cardiology Office Visits:01/18/2020 with Dr Fletcher Anon  Last office visit was 04/11/2018 with Ignacia Bayley, NP and 07/16/2017 with Dr Caryl Comes. Canceled appts with Dr Caryl Comes 02/23/19 & 04/20/19.  Other appts canceled were March, April. May and July appointments with Dr Fletcher Anon and Gilford Rile, NP.  Copy of ICM check sent to Dr.Klein and Dr Fletcher Anon   3 month ICM trend: 10/25/2019    1 Year ICM trend:       [name], RN 10/25/2019 2:15 PM

## 2019-11-08 DIAGNOSIS — S8002XA Contusion of left knee, initial encounter: Secondary | ICD-10-CM | POA: Diagnosis not present

## 2019-11-08 DIAGNOSIS — S61412A Laceration without foreign body of left hand, initial encounter: Secondary | ICD-10-CM | POA: Diagnosis not present

## 2019-11-29 ENCOUNTER — Ambulatory Visit (INDEPENDENT_AMBULATORY_CARE_PROVIDER_SITE_OTHER): Payer: Medicare Other

## 2019-11-29 DIAGNOSIS — I5022 Chronic systolic (congestive) heart failure: Secondary | ICD-10-CM

## 2019-11-29 DIAGNOSIS — Z95810 Presence of automatic (implantable) cardiac defibrillator: Secondary | ICD-10-CM | POA: Diagnosis not present

## 2019-12-01 ENCOUNTER — Telehealth: Payer: Self-pay

## 2019-12-01 NOTE — Telephone Encounter (Signed)
Remote ICM transmission received.  Attempted call to husband regarding ICM remote transmission and left detailed message per DPR to return call.  Advised to return call for any fluid symptoms or questions. Marland Kitchen

## 2019-12-01 NOTE — Progress Notes (Signed)
EPIC Encounter for ICM Monitoring  Patient Name: Stacy Grimes is a 69 y.o. female Date: 12/01/2019 Primary Care Physican: Cyndi Bender, PA-C Primary [place] Electrophysiologist: Vergie Living Pacing: 96% 8/17/2021Weight:205lbs  AT/AF Burden <1% (taking Aspirin)  Attempted call to husband, [name] per DPR.  Left detailed message per DPR regarding transmission. Transmission reviewed.   Coruve thoracic impedance suggesting possible fluid accumulation starting 11/28/2019   Prescribed:Furosemide20 mg 1 tabletby mouth as needed for up to 30 days.      Labs: 04/12/2018 Creatinine0.97, BUN10, Potassium4.1, Sodium137, GFR>60 04/11/2018 Creatinine1.17, BUN16, Potassium3.8, Sodium136, KPT46-56  04/10/2018 Creatinine1.37, BUN24, Potassium3.0, Sodium136, GFR40-46  04/09/2018 Creatinine1.44, BUN27, Potassium3.4, [id number], [id number] A complete set of results can be found in Results Review.  Recommendations:Left message to take 1 PRN Furosemide if needed for fluid.   Follow-up plan: ICM clinic phone appointment on11/06/2019 to recheck fluid levels.91 day device clinic remote transmission scheduled12/03/2019.  EP/Cardiology Office Visits:01/18/2020 with Dr Fletcher Anon  Last office visit was 04/11/2018 with Ignacia Bayley, NP and 07/16/2017 with Dr Caryl Comes.Canceled apptswith Dr Caryl Comes 02/23/19 &04/20/19. Other appts canceled wereMarch, April.May and July appointments with Dr Fletcher Anon and Gilford Rile, NP.  Copy of ICM check sent to Dr.Klein and Dr Fletcher Anon    3 month ICM trend: 11/29/2019    1 Year ICM trend:       [name], RN 12/01/2019 12:59 PM

## 2019-12-06 ENCOUNTER — Ambulatory Visit: Payer: Medicare Other | Admitting: Internal Medicine

## 2019-12-10 ENCOUNTER — Ambulatory Visit (INDEPENDENT_AMBULATORY_CARE_PROVIDER_SITE_OTHER): Payer: Medicare Other

## 2019-12-10 DIAGNOSIS — Z95810 Presence of automatic (implantable) cardiac defibrillator: Secondary | ICD-10-CM

## 2019-12-10 DIAGNOSIS — I5022 Chronic systolic (congestive) heart failure: Secondary | ICD-10-CM

## 2019-12-10 NOTE — Progress Notes (Signed)
EPIC Encounter for ICM Monitoring  Patient Name: Stacy Grimes is a 69 y.o. female Date: 12/10/2019 Primary Care Physican: Cyndi Bender, PA-C Primary [place] Electrophysiologist: Vergie Living Pacing: 96% 8/17/2021Weight:205lbs  AT/AF Burden <1% (taking Aspirin)  Transmission reviewed.   Coruve thoracic impedancesuggesting fluid levels returned to normal.  Prescribed:Furosemide20 mg 1 tabletby mouth as needed for up to 30 days.   Labs: 04/12/2018 Creatinine0.97, BUN10, Potassium4.1, Sodium137, GFR>60 04/11/2018 Creatinine1.17, BUN16, Potassium3.8, Sodium136, UKR83-81  04/10/2018 Creatinine1.37, BUN24, Potassium3.0, Sodium136, GFR40-46  04/09/2018 Creatinine1.44, BUN27, Potassium3.4, [id number], [id number] A complete set of results can be found in Results Review.  Recommendations:No changes  Follow-up plan: ICM clinic phone appointment on12/04/2019.91 day device clinic remote transmission scheduled12/03/2019.  EP/Cardiology Office Visits:01/18/2020 with Dr Fletcher Anon  Last office visit was 04/11/2018 with Ignacia Bayley, NP and 07/16/2017 with Dr Caryl Comes.Canceled apptswith Dr Caryl Comes 02/23/19 &04/20/19. Other appts canceled wereMarch, April.May and July appointments with Dr Fletcher Anon and Gilford Rile, NP.  Copy of ICM check sent to [facility].   3 month ICM trend: 12/10/2019    1 Year ICM trend:       [name], RN 12/10/2019 12:34 PM

## 2019-12-15 ENCOUNTER — Emergency Department: Payer: Medicare Other

## 2019-12-15 ENCOUNTER — Other Ambulatory Visit: Payer: Self-pay

## 2019-12-15 ENCOUNTER — Encounter: Payer: Self-pay | Admitting: Emergency Medicine

## 2019-12-15 ENCOUNTER — Inpatient Hospital Stay
Admission: EM | Admit: 2019-12-15 | Discharge: 2019-12-21 | DRG: 562 | Disposition: A | Discharge status: Skilled Nursing Facility | Payer: Medicare Other | Attending: Student | Admitting: Student

## 2019-12-15 DIAGNOSIS — Z7951 Long term (current) use of inhaled steroids: Secondary | ICD-10-CM

## 2019-12-15 DIAGNOSIS — M4856XA Collapsed vertebra, not elsewhere classified, lumbar region, initial encounter for fracture: Secondary | ICD-10-CM | POA: Diagnosis not present

## 2019-12-15 DIAGNOSIS — R4182 Altered mental status, unspecified: Secondary | ICD-10-CM | POA: Diagnosis not present

## 2019-12-15 DIAGNOSIS — M25569 Pain in unspecified knee: Secondary | ICD-10-CM

## 2019-12-15 DIAGNOSIS — K5730 Diverticulosis of large intestine without perforation or abscess without bleeding: Secondary | ICD-10-CM | POA: Diagnosis not present

## 2019-12-15 DIAGNOSIS — J449 Chronic obstructive pulmonary disease, unspecified: Secondary | ICD-10-CM | POA: Diagnosis present

## 2019-12-15 DIAGNOSIS — I110 Hypertensive heart disease with heart failure: Secondary | ICD-10-CM | POA: Diagnosis present

## 2019-12-15 DIAGNOSIS — R778 Other specified abnormalities of plasma proteins: Secondary | ICD-10-CM | POA: Diagnosis present

## 2019-12-15 DIAGNOSIS — Z87891 Personal history of nicotine dependence: Secondary | ICD-10-CM

## 2019-12-15 DIAGNOSIS — R0902 Hypoxemia: Secondary | ICD-10-CM

## 2019-12-15 DIAGNOSIS — M25562 Pain in left knee: Secondary | ICD-10-CM | POA: Diagnosis present

## 2019-12-15 DIAGNOSIS — Z20822 Contact with and (suspected) exposure to covid-19: Secondary | ICD-10-CM | POA: Diagnosis not present

## 2019-12-15 DIAGNOSIS — Z9841 Cataract extraction status, right eye: Secondary | ICD-10-CM

## 2019-12-15 DIAGNOSIS — W19XXXA Unspecified fall, initial encounter: Secondary | ICD-10-CM | POA: Diagnosis present

## 2019-12-15 DIAGNOSIS — I248 Other forms of acute ischemic heart disease: Secondary | ICD-10-CM | POA: Diagnosis not present

## 2019-12-15 DIAGNOSIS — S8002XA Contusion of left knee, initial encounter: Secondary | ICD-10-CM | POA: Diagnosis not present

## 2019-12-15 DIAGNOSIS — R52 Pain, unspecified: Secondary | ICD-10-CM | POA: Diagnosis not present

## 2019-12-15 DIAGNOSIS — Z8249 Family history of ischemic heart disease and other diseases of the circulatory system: Secondary | ICD-10-CM

## 2019-12-15 DIAGNOSIS — R069 Unspecified abnormalities of breathing: Secondary | ICD-10-CM | POA: Diagnosis not present

## 2019-12-15 DIAGNOSIS — F0391 Unspecified dementia with behavioral disturbance: Secondary | ICD-10-CM | POA: Diagnosis not present

## 2019-12-15 DIAGNOSIS — Z9842 Cataract extraction status, left eye: Secondary | ICD-10-CM

## 2019-12-15 DIAGNOSIS — K6389 Other specified diseases of intestine: Secondary | ICD-10-CM | POA: Diagnosis not present

## 2019-12-15 DIAGNOSIS — I5022 Chronic systolic (congestive) heart failure: Secondary | ICD-10-CM | POA: Diagnosis not present

## 2019-12-15 DIAGNOSIS — E1165 Type 2 diabetes mellitus with hyperglycemia: Secondary | ICD-10-CM

## 2019-12-15 DIAGNOSIS — M25552 Pain in left hip: Secondary | ICD-10-CM | POA: Diagnosis not present

## 2019-12-15 DIAGNOSIS — M1A9XX0 Chronic gout, unspecified, without tophus (tophi): Secondary | ICD-10-CM | POA: Diagnosis present

## 2019-12-15 DIAGNOSIS — N83291 Other ovarian cyst, right side: Secondary | ICD-10-CM | POA: Diagnosis not present

## 2019-12-15 DIAGNOSIS — M1712 Unilateral primary osteoarthritis, left knee: Secondary | ICD-10-CM | POA: Diagnosis not present

## 2019-12-15 DIAGNOSIS — Z91041 Radiographic dye allergy status: Secondary | ICD-10-CM

## 2019-12-15 DIAGNOSIS — Z7982 Long term (current) use of aspirin: Secondary | ICD-10-CM

## 2019-12-15 DIAGNOSIS — E039 Hypothyroidism, unspecified: Secondary | ICD-10-CM | POA: Diagnosis present

## 2019-12-15 DIAGNOSIS — K219 Gastro-esophageal reflux disease without esophagitis: Secondary | ICD-10-CM | POA: Diagnosis present

## 2019-12-15 DIAGNOSIS — Z79899 Other long term (current) drug therapy: Secondary | ICD-10-CM

## 2019-12-15 DIAGNOSIS — S60552A Superficial foreign body of left hand, initial encounter: Principal | ICD-10-CM

## 2019-12-15 DIAGNOSIS — Z9981 Dependence on supplemental oxygen: Secondary | ICD-10-CM

## 2019-12-15 DIAGNOSIS — R000 Tachycardia, unspecified: Secondary | ICD-10-CM | POA: Diagnosis not present

## 2019-12-15 DIAGNOSIS — Z950 Presence of cardiac pacemaker: Secondary | ICD-10-CM

## 2019-12-15 DIAGNOSIS — Z823 Family history of stroke: Secondary | ICD-10-CM

## 2019-12-15 DIAGNOSIS — J9621 Acute and chronic respiratory failure with hypoxia: Secondary | ICD-10-CM | POA: Diagnosis present

## 2019-12-15 DIAGNOSIS — R7989 Other specified abnormal findings of blood chemistry: Secondary | ICD-10-CM | POA: Diagnosis present

## 2019-12-15 DIAGNOSIS — Z961 Presence of intraocular lens: Secondary | ICD-10-CM | POA: Diagnosis present

## 2019-12-15 DIAGNOSIS — F4310 Post-traumatic stress disorder, unspecified: Secondary | ICD-10-CM | POA: Diagnosis present

## 2019-12-15 DIAGNOSIS — I10 Essential (primary) hypertension: Secondary | ICD-10-CM | POA: Diagnosis not present

## 2019-12-15 DIAGNOSIS — Z885 Allergy status to narcotic agent status: Secondary | ICD-10-CM

## 2019-12-15 DIAGNOSIS — M545 Low back pain, unspecified: Secondary | ICD-10-CM | POA: Diagnosis not present

## 2019-12-15 DIAGNOSIS — S82112A Displaced fracture of left tibial spine, initial encounter for closed fracture: Principal | ICD-10-CM | POA: Diagnosis present

## 2019-12-15 DIAGNOSIS — Z7984 Long term (current) use of oral hypoglycemic drugs: Secondary | ICD-10-CM

## 2019-12-15 DIAGNOSIS — R451 Restlessness and agitation: Secondary | ICD-10-CM | POA: Diagnosis present

## 2019-12-15 DIAGNOSIS — F32A Depression, unspecified: Secondary | ICD-10-CM | POA: Diagnosis present

## 2019-12-15 LAB — CBC
HCT: 36.4 % (ref 36.0–46.0)
Hemoglobin: 10.7 g/dL — ABNORMAL LOW (ref 12.0–15.0)
MCH: 26.2 pg (ref 26.0–34.0)
MCHC: 29.4 g/dL — ABNORMAL LOW (ref 30.0–36.0)
MCV: 89 fL (ref 80.0–100.0)
Platelets: 366 10*3/uL (ref 150–400)
RBC: 4.09 MIL/uL (ref 3.87–5.11)
RDW: 16.3 % — ABNORMAL HIGH (ref 11.5–15.5)
WBC: 11.4 10*3/uL — ABNORMAL HIGH (ref 4.0–10.5)
nRBC: 0 % (ref 0.0–0.2)

## 2019-12-15 LAB — CBG MONITORING, ED
Glucose-Capillary: 162 mg/dL — ABNORMAL HIGH (ref 70–99)
Glucose-Capillary: 155 mg/dL — ABNORMAL HIGH (ref 70–99)

## 2019-12-15 LAB — TROPONIN I (HIGH SENSITIVITY)
Troponin I (High Sensitivity): 25 ng/L — ABNORMAL HIGH (ref <18)
Troponin I (High Sensitivity): 78 ng/L — ABNORMAL HIGH (ref <18)
Troponin I (High Sensitivity): 84 ng/L — ABNORMAL HIGH (ref <18)
Troponin I (High Sensitivity): 73 ng/L — ABNORMAL HIGH (ref <18)

## 2019-12-15 LAB — URINALYSIS, COMPLETE (UACMP) WITH MICROSCOPIC
Bilirubin Urine: NEGATIVE
Glucose, UA: 50 mg/dL — AB
Hgb urine dipstick: NEGATIVE
Ketones, ur: NEGATIVE mg/dL
Leukocytes,Ua: NEGATIVE
Nitrite: NEGATIVE
Protein, ur: 100 mg/dL — AB
Specific Gravity, Urine: 1.023 (ref 1.005–1.030)
pH: 5 (ref 5.0–8.0)

## 2019-12-15 LAB — GLUCOSE, CAPILLARY: Glucose-Capillary: 100 mg/dL — ABNORMAL HIGH (ref 70–99)

## 2019-12-15 LAB — BASIC METABOLIC PANEL
Anion gap: 12 (ref 5–15)
BUN: 17 mg/dL (ref 8–23)
CO2: 30 mmol/L (ref 22–32)
Calcium: 9 mg/dL (ref 8.9–10.3)
Chloride: 97 mmol/L — ABNORMAL LOW (ref 98–111)
Creatinine, Ser: 1.12 mg/dL — ABNORMAL HIGH (ref 0.44–1.00)
GFR, Estimated: 53 mL/min — ABNORMAL LOW (ref >60)
Glucose, Bld: 169 mg/dL — ABNORMAL HIGH (ref 70–99)
Potassium: 4 mmol/L (ref 3.5–5.1)
Sodium: 139 mmol/L (ref 135–145)

## 2019-12-15 LAB — BRAIN NATRIURETIC PEPTIDE: B Natriuretic Peptide: 82.4 pg/mL (ref 0.0–100.0)

## 2019-12-15 LAB — RESPIRATORY PANEL BY RT PCR (FLU A&B, COVID)
Influenza A by PCR: NEGATIVE
Influenza B by PCR: NEGATIVE
SARS Coronavirus 2 by RT PCR: NEGATIVE

## 2019-12-15 LAB — HEMOGLOBIN A1C
Hgb A1c MFr Bld: 7.3 % — ABNORMAL HIGH (ref 4.8–5.6)
Mean Plasma Glucose: 162.81 mg/dL

## 2019-12-15 MED ORDER — ALBUTEROL SULFATE HFA 108 (90 BASE) MCG/ACT IN AERS
2.0000 | INHALATION_SPRAY | Freq: Four times a day (QID) | RESPIRATORY_TRACT | Status: DC | PRN
  Filled 2019-12-15: qty 6.7

## 2019-12-15 MED ORDER — ALLOPURINOL 100 MG PO TABS
300.0000 mg | ORAL_TABLET | Freq: Every day | ORAL | Status: DC
  Administered 2019-12-16 – 2019-12-21 (×6): 300 mg via ORAL
  Filled 2019-12-15: qty 3
  Filled 2019-12-15: qty 1
  Filled 2019-12-15 (×3): qty 3
  Filled 2019-12-15: qty 1
  Filled 2019-12-15: qty 3

## 2019-12-15 MED ORDER — CEPHALEXIN 250 MG PO CAPS: 250.0000 mg | ORAL_CAPSULE | Freq: Two times a day (BID) | ORAL | 0 refills | Status: DC

## 2019-12-15 MED ORDER — FLUTICASONE FUROATE-VILANTEROL 200-25 MCG/INH IN AEPB
1.0000 | INHALATION_SPRAY | Freq: Every day | RESPIRATORY_TRACT | Status: DC
  Administered 2019-12-16 – 2019-12-21 (×5): 1 via RESPIRATORY_TRACT
  Filled 2019-12-15 (×2): qty 28

## 2019-12-15 MED ORDER — ACETAMINOPHEN 325 MG PO TABS
650.0000 mg | ORAL_TABLET | Freq: Four times a day (QID) | ORAL | Status: DC | PRN
  Administered 2019-12-15: 650 mg via ORAL
  Filled 2019-12-15 (×3): qty 2

## 2019-12-15 MED ORDER — ALBUTEROL SULFATE 0.63 MG/3ML IN NEBU: 1.0000 | INHALATION_SOLUTION | Freq: Four times a day (QID) | RESPIRATORY_TRACT | Status: DC | PRN

## 2019-12-15 MED ORDER — ALBUTEROL SULFATE HFA 108 (90 BASE) MCG/ACT IN AERS: 2.0000 | INHALATION_SPRAY | Freq: Four times a day (QID) | RESPIRATORY_TRACT | Status: DC | PRN

## 2019-12-15 MED ORDER — CEPHALEXIN 250 MG PO CAPS
250.0000 mg | ORAL_CAPSULE | Freq: Once | ORAL | Status: AC
  Administered 2019-12-15: 250 mg via ORAL
  Filled 2019-12-15: qty 1

## 2019-12-15 MED ORDER — VITAMIN B-12 1000 MCG PO TABS
1000.0000 ug | ORAL_TABLET | Freq: Two times a day (BID) | ORAL | Status: DC
  Administered 2019-12-16 – 2019-12-21 (×10): 1000 ug via ORAL
  Filled 2019-12-15 (×11): qty 1

## 2019-12-15 MED ORDER — ENOXAPARIN SODIUM 60 MG/0.6ML ~~LOC~~ SOLN
45.0000 mg | SUBCUTANEOUS | Status: DC
  Administered 2019-12-15 – 2019-12-20 (×5): 45 mg via SUBCUTANEOUS
  Filled 2019-12-15 (×5): qty 0.6

## 2019-12-15 MED ORDER — OXYCODONE HCL 5 MG PO TABS
5.0000 mg | ORAL_TABLET | Freq: Four times a day (QID) | ORAL | Status: DC | PRN
  Administered 2019-12-15: 5 mg via ORAL
  Filled 2019-12-15: qty 1

## 2019-12-15 MED ORDER — GABAPENTIN 300 MG PO CAPS
300.0000 mg | ORAL_CAPSULE | Freq: Two times a day (BID) | ORAL | Status: DC
  Administered 2019-12-15 – 2019-12-21 (×12): 300 mg via ORAL
  Filled 2019-12-15 (×13): qty 1

## 2019-12-15 MED ORDER — DONEPEZIL HCL 5 MG PO TABS
5.0000 mg | ORAL_TABLET | Freq: Every day | ORAL | Status: DC
  Administered 2019-12-15 – 2019-12-20 (×5): 5 mg via ORAL
  Filled 2019-12-15 (×7): qty 1

## 2019-12-15 MED ORDER — ACETAMINOPHEN 325 MG PO TABS
650.0000 mg | ORAL_TABLET | ORAL | Status: DC | PRN
  Administered 2019-12-15 – 2019-12-19 (×4): 650 mg via ORAL
  Filled 2019-12-15 (×2): qty 2

## 2019-12-15 MED ORDER — METOPROLOL SUCCINATE ER 25 MG PO TB24
25.0000 mg | ORAL_TABLET | Freq: Every day | ORAL | Status: DC
  Administered 2019-12-16 – 2019-12-21 (×6): 25 mg via ORAL
  Filled 2019-12-15 (×6): qty 1

## 2019-12-15 MED ORDER — TIOTROPIUM BROMIDE MONOHYDRATE 18 MCG IN CAPS
18.0000 ug | ORAL_CAPSULE | Freq: Every day | RESPIRATORY_TRACT | Status: DC | PRN
  Filled 2019-12-15: qty 5

## 2019-12-15 MED ORDER — OXYCODONE HCL 5 MG PO TABS
5.0000 mg | ORAL_TABLET | ORAL | Status: DC | PRN
  Administered 2019-12-15 – 2019-12-16 (×3): 5 mg via ORAL
  Filled 2019-12-15 (×3): qty 1

## 2019-12-15 MED ORDER — ONDANSETRON HCL 4 MG/2ML IJ SOLN: 4.0000 mg | Freq: Four times a day (QID) | INTRAMUSCULAR | Status: DC | PRN

## 2019-12-15 MED ORDER — OXYCODONE-ACETAMINOPHEN 5-325 MG PO TABS
1.0000 | ORAL_TABLET | ORAL | Status: AC
  Administered 2019-12-15: 1 via ORAL
  Filled 2019-12-15: qty 1

## 2019-12-15 MED ORDER — FUROSEMIDE 20 MG PO TABS: 20.0000 mg | ORAL_TABLET | Freq: Every day | ORAL | Status: DC | PRN

## 2019-12-15 MED ORDER — HYDROXYZINE HCL 25 MG PO TABS
25.0000 mg | ORAL_TABLET | Freq: Every day | ORAL | Status: DC
  Administered 2019-12-15 – 2019-12-19 (×5): 25 mg via ORAL
  Filled 2019-12-15 (×9): qty 1

## 2019-12-15 MED ORDER — POLYETHYLENE GLYCOL 3350 17 G PO PACK
17.0000 g | PACK | Freq: Every day | ORAL | Status: DC
  Administered 2019-12-17 – 2019-12-21 (×5): 17 g via ORAL
  Filled 2019-12-15 (×6): qty 1

## 2019-12-15 MED ORDER — PANTOPRAZOLE SODIUM 40 MG PO TBEC
40.0000 mg | DELAYED_RELEASE_TABLET | Freq: Every day | ORAL | Status: DC
  Administered 2019-12-16 – 2019-12-21 (×6): 40 mg via ORAL
  Filled 2019-12-15 (×6): qty 1

## 2019-12-15 MED ORDER — LEVOTHYROXINE SODIUM 112 MCG PO TABS
112.0000 ug | ORAL_TABLET | Freq: Every day | ORAL | Status: DC
  Administered 2019-12-16 – 2019-12-21 (×6): 112 ug via ORAL
  Filled 2019-12-15 (×8): qty 1

## 2019-12-15 MED ORDER — DIGOXIN 125 MCG PO TABS
0.1250 mg | ORAL_TABLET | ORAL | Status: DC
  Administered 2019-12-16 – 2019-12-20 (×3): 0.125 mg via ORAL
  Filled 2019-12-15 (×3): qty 1

## 2019-12-15 MED ORDER — ENOXAPARIN SODIUM 40 MG/0.4ML ~~LOC~~ SOLN: 40.0000 mg | SUBCUTANEOUS | Status: DC

## 2019-12-15 MED ORDER — ASPIRIN EC 81 MG PO TBEC
81.0000 mg | DELAYED_RELEASE_TABLET | Freq: Every day | ORAL | Status: DC
  Administered 2019-12-16 – 2019-12-21 (×6): 81 mg via ORAL
  Filled 2019-12-15 (×6): qty 1

## 2019-12-15 MED ORDER — BISACODYL 5 MG PO TBEC
10.0000 mg | DELAYED_RELEASE_TABLET | Freq: Every day | ORAL | Status: DC
  Administered 2019-12-15 – 2019-12-20 (×5): 10 mg via ORAL
  Filled 2019-12-15 (×6): qty 2

## 2019-12-15 MED ORDER — MELATONIN 5 MG PO TABS
10.0000 mg | ORAL_TABLET | Freq: Every evening | ORAL | Status: DC | PRN
  Administered 2019-12-15 – 2019-12-19 (×2): 10 mg via ORAL
  Filled 2019-12-15 (×5): qty 2

## 2019-12-15 MED ORDER — INSULIN ASPART 100 UNIT/ML ~~LOC~~ SOLN
0.0000 [IU] | Freq: Three times a day (TID) | SUBCUTANEOUS | Status: DC
  Administered 2019-12-15: 3 [IU] via SUBCUTANEOUS
  Administered 2019-12-16 – 2019-12-17 (×2): 2 [IU] via SUBCUTANEOUS
  Administered 2019-12-17 – 2019-12-18 (×2): 3 [IU] via SUBCUTANEOUS
  Administered 2019-12-20: 2 [IU] via SUBCUTANEOUS
  Administered 2019-12-20 – 2019-12-21 (×2): 3 [IU] via SUBCUTANEOUS
  Filled 2019-12-15 (×8): qty 1

## 2019-12-15 MED ORDER — MORPHINE SULFATE (PF) 2 MG/ML IV SOLN
2.0000 mg | Freq: Once | INTRAVENOUS | Status: AC
  Administered 2019-12-15: 2 mg via INTRAVENOUS
  Filled 2019-12-15: qty 1

## 2019-12-15 MED ORDER — VENLAFAXINE HCL 37.5 MG PO TABS
75.0000 mg | ORAL_TABLET | Freq: Two times a day (BID) | ORAL | Status: DC
  Administered 2019-12-15 – 2019-12-21 (×11): 75 mg via ORAL
  Filled 2019-12-15 (×13): qty 2

## 2019-12-15 NOTE — ED Notes (Signed)
Attempted to ambulate pt with walker - pt unable to stand d/t pain in left leg. MD aware

## 2019-12-15 NOTE — ED Notes (Signed)
Pt's husband to desk to report pt c/o splinter to L hand and R leg pain. EDP aware, states will come review results with patient and husband momentarily.

## 2019-12-15 NOTE — ED Notes (Signed)
Attempted to call report - receiving nurse in another pt room and will call back
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
## 2019-12-15 NOTE — ED Notes (Signed)
Pt sitting up in bed, husband at bedside with breakfast tray at bedside.

## 2019-12-15 NOTE — ED Notes (Signed)
Assumed care of this patient at this time.

## 2019-12-15 NOTE — ED Notes (Signed)
Pt oxygen now 91% on 2L

## 2019-12-15 NOTE — ED Notes (Signed)
X-ray at bedside to perform DG Tib/fib. Pt's wound from splinter removal cleansed and bandaid applied by this RN.

## 2019-12-15 NOTE — Progress Notes (Signed)
PHARMACIST - PHYSICIAN COMMUNICATION  CONCERNING:  Enoxaparin (Lovenox) for DVT Prophylaxis    RECOMMENDATION: Patient was prescribed enoxaprin 40mg  q24 hours for VTE prophylaxis.   Filed Weights   12/15/19 0715  Weight: 92.3 kg (203 lb 7.8 oz)    Body mass index is 36.05 kg/m.  Estimated Creatinine Clearance: 50.5 mL/min (A) (by C-G formula based on SCr of 1.12 mg/dL (H)).   Based on [place] patient is candidate for enoxaparin 0.5mg /kg TBW SQ every 24 hours based on BMI being >30.    DESCRIPTION: Pharmacy has adjusted enoxaparin dose per [facility] policy.  Patient is now receiving enoxaparin 0.5 mg/kg (45 mg) every 24 hours    Rowland Lathe, PharmD Clinical Pharmacist  12/15/2019 10:17 PM
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
## 2019-12-15 NOTE — ED Notes (Signed)
EDP at bedside to remove splinter from L hand at this time.

## 2019-12-15 NOTE — ED Notes (Signed)
Pt in CT scan.

## 2019-12-15 NOTE — ED Notes (Signed)
Pt c/o continued pain to L leg, pt's leg repositioned in bed at this time. Pt states some relief of pain with leg being repositioned, pt's husband remains at bedside at this time.

## 2019-12-15 NOTE — Progress Notes (Signed)
Pt received to room 249 from ED at this time.  Pt oriented to room and call bell.  See admit data base, assessment and vs's.  Pt moaning and yelling that she hurts in her legs.

## 2019-12-15 NOTE — ED Notes (Signed)
Dorian, EDT and Terrence Dupont, EDT at bedside to perform and out cath at this time.

## 2019-12-15 NOTE — TOC Initial Note (Signed)
Transition of Care [facility]) - Initial/Assessment Note    Patient Details  Name: Stacy Grimes MRN: [id number] Date of Birth: [birth year]  Transition of [facility]) CM/SW Contact:    Ova Freshwater Phone Number: [phone] 12/15/2019, 3:26 PM  Clinical Narrative:                  CSW spoke with patient's Stacy Grimes, [name] [phone],DGL is the patient main caregiver.  Mr Salle stated the patient is able to perform ADLs, but uses a walker or cane to ambulate once in a while and uses 4L of O2, active with [employer].  Mr. Spease stated he takes the patient to her doctor's appointments and picks up her medications.  Mr. Coggeshall stated the patient is compliant with her medication, and makes sure she takes it everyday.  Patient's PCP in Dr. Lanny Hurst at [facility] in [place].  Mr. Cerrato stated they would prefer home health but would consider SNF if that is the recommendation.  Expected Discharge Plan: [place]     Patient Goals and CMS Choice   CMS Medicare.gov Compare Post Acute Care list provided to:: Other (Comment Required) [name], [name] [phone]) Choice offered to / list presented to : Spouse Pair, [name] [phone])  Expected Discharge Plan and Services Expected Discharge Plan: [place] In-house Referral: Clinical Social Work   Post Acute Care Choice: [place] arrangements for the past 2 months: [place]                                      Prior Living Arrangements/Services Living arrangements for the past 2 months: Single Family Home Lives with:: Significant Other Patient language and need for interpreter reviewed:: Yes Do you feel safe going back to the place where you live?: Yes      Need for Family Participation in Patient Care: Yes (Comment) Care giver support system in place?: Yes (comment) Current home services: Other (comment) (O2, 4L active  with [employer]) Criminal Activity/Legal Involvement Pertinent to Current Situation/Hospitalization: No - Comment as needed  Activities of Daily Living      Permission Sought/Granted Permission sought to share information with : Family Supports [name], [name] [phone]) Permission granted to share information with : Yes, Verbal Permission Granted  Share Information with NAME: Stacy Grimes, [name] [phone]           Emotional Assessment Appearance:: Appears older than stated age Attitude/Demeanor/Rapport: Engaged Affect (typically observed): Stable Orientation: : Oriented to Self, Oriented to Place, Oriented to  Time, Oriented to Situation Alcohol / Substance Use: Not Applicable Psych Involvement: No (comment)  Admission diagnosis:  confused Patient Active Problem List   Diagnosis Date Noted  . Dehydration 04/12/2018  . Syncope 04/09/2018  . 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 Diverticulosis of colon (without mention of hemorrhage) 08/31/2012  . Fatigue 08/10/2012  . Personal history of colonic polyps 07/02/2012  . Dysphagia, unspecified(787.20) 07/02/2012  . Unspecified constipation 07/02/2012  . 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 HYPERTENSION, BENIGN 03/24/2009  . SLEEP APNEA, OBSTRUCTIVE 03/23/2009  . Dilated cardiomyopathy ([place]) 03/23/2009  . PALPITATIONS 03/23/2009  . MURMUR 03/23/2009  . COUGH 03/23/2009   PCP:  Cyndi Bender, PA-C Pharmacy:   [place], [place] - [address] [phone]  [place] [address] Phone: [phone] Fax: [phone]     Social Determinants of Health (SDOH) Interventions    Readmission Risk Interventions No flowsheet data found.

## 2019-12-15 NOTE — ED Provider Notes (Signed)
Patient noted to have a splinter approximately 1 cm long by about 1 mm wide appears to be wood through the thenar eminence of the hand.  This was successfully removed without difficulty utilizing tweezers.  Patient tolerated procedure well.  No ongoing or evidence of additional foreign body patient reports foreign body sensation has resolved.  Placed on cephalexin as preventive for infection.  Wound cleansed and irrigated.  Denies pain to palpation of the remainder of the hand no bony deformities  Does now report some tenderness over the proximal fibula over the left leg.  Will obtain x-ray   Delman Kitten, MD 12/15/19 1016

## 2019-12-15 NOTE — ED Notes (Signed)
Pt repositioned in bed by this RN.

## 2019-12-15 NOTE — H&P (Signed)
[facility] History and Physical   Patient: Stacy Grimes:[id number]   PCP: Cyndi Bender, PA-C DOB: May 24, 1950   DOA: 12/15/2019   DOS: 12/15/2019   DOS: the patient was seen and examined on 12/15/2019   Patient coming from: The patient is coming from Home  Chief Complaint: Agitation, AMS found in neighbors yard, complaining of left knee pain and found to have elevated troponin   HPI: Stacy Grimes is a 69 y.o. female with Past medical history of HTN, chronic systolic CHF LVEF 40 to 01%, nonischemic cardiomyopathy as per cardiac cath done 10 years ago, s/p pacemaker, mild cognitive decline/dementia, gout, hypothyroid, NIDDM T2, COPD, chronic hypoxic respiratory failure on supplemental oxygen, depression, PTSD, patient is poor historian, stated that she does not remember anything and she was in the neighbors yard and EMS was called right now patient is complaining of pain in the left knee and buttock area denies any other complaints.  ED physician's chart reviewed and got information that patient's husband stated that patient has PTSD and gets agitated at nighttime so she was agitated and went out of the house last night and she was found in the neighbor's yard close to the pool and patient was hypoxic and she was saying that she feels hot and her whole body.,  Patient is AO x2 at baseline does not remember the year and month.  She denies any headache or dizziness, no chest pain or shortness of breath, no abdominal pain, no nausea vomiting or diarrhea.   ED Course: Elevated troponin XX 5 followed by 78, CT head negative for any acute findings, x-ray left tibia-fibula did not show any fracture, x-ray left femur negative for any fracture.   CT scan pelvis did not show any acute findings, large stool burden.    Review of Systems: as mentioned in the history of present illness.  All other systems reviewed and are negative.  Past Medical History:  Diagnosis Date  . AAA (abdominal  aortic aneurysm) ([place])   . Anginal pain ([place])   . Anxiety   . Arthritis   . Automatic implantable cardioverter-defibrillator in situ   . Biventricular ICD  [employer]   . COPD (chronic obstructive pulmonary disease) ([place])   . Deafness    left ear  . Dementia ([place])   . Depression   . Dizziness   . Dysrhythmia   . Fracture    history of spinal fracture  . GERD (gastroesophageal reflux disease)   . HFrEF (heart failure with reduced ejection fraction) ([place])    a. 2010 Cath: nonobs dzs, EF 15-20%; b. 09/2017 Echo: EF suboptimal. EF low nl; c. 04/2018 Echo: EF 40-45%.  . Hypertension   . Hypothyroidism   . Non-ischemic cardiomyopathy ([place])    a. 2010 Cath: nonobs dzs, EF 15-20%; b. 2011 s/p SJM CRT-D; c. 09/2016 s/p Gen change; d. 09/2017 Echo: EF suboptimal. EF low nl; e. 04/2018 Echo: EF 40-45%.  . Oxygen dependent   . Palpitations   . Shortness of breath dyspnea   . Sleep apnea   . Wheezing    Past Surgical History:  Procedure Laterality Date  . ABDOMINAL HYSTERECTOMY    . BIV ICD GENERATOR CHANGEOUT N/A 09/17/2016   Procedure: BiV ICD Generator Changeout;  Surgeon: Deboraha Sprang, MD;  Location: [facility];  Service: Cardiovascular;  Laterality: N/A;  . BLADDER SURGERY    . CARDIAC CATHETERIZATION    . CARDIAC DEFIBRILLATOR PLACEMENT  4 yrs ago  pacemaker  . CATARACT EXTRACTION W/PHACO Left 09/01/2014   Procedure: CATARACT EXTRACTION PHACO AND INTRAOCULAR LENS PLACEMENT (IOC);  Surgeon: Lyla Glassing, MD;  Location: [facility];  Service: Ophthalmology;  Laterality: Left;  [place]: 1:12.1   . CATARACT EXTRACTION W/PHACO Right 09/29/2014   Procedure: CATARACT EXTRACTION PHACO AND INTRAOCULAR LENS PLACEMENT (IOC);  Surgeon: Lyla Glassing, MD;  Location: [facility];  Service: Ophthalmology;  Laterality: Right;  [place]: 00:52.7   . CHOLECYSTECTOMY    . COLON SURGERY    . COLONOSCOPY    . COLONOSCOPY N/A 08/31/2012   Procedure: COLONOSCOPY;  Surgeon: Inda Castle, MD;  Location: [facility];  Service: Endoscopy;  Laterality: N/A;  . ESOPHAGOGASTRODUODENOSCOPY N/A 07/06/2012   Procedure: ESOPHAGOGASTRODUODENOSCOPY (EGD);  Surgeon: Lafayette Dragon, MD;  Location: [facility];  Service: Endoscopy;  Laterality: N/A;  . KNEE SURGERY Right   . MIDDLE EAR SURGERY    . SAVORY DILATION N/A 07/06/2012   Procedure: SAVORY DILATION;  Surgeon: Lafayette Dragon, MD;  Location: [facility];  Service: Endoscopy;  Laterality: N/A;  . WRIST SURGERY Right    Social History:  reports that she quit smoking about 17 years ago. Her smoking use included cigarettes. She has a 60.00 pack-year smoking history. She has never used smokeless tobacco. She reports that she does not drink alcohol and does not use drugs.  Allergies  Allergen Reactions  . Codeine Palpitations  . Contrast Media [Iodinated Diagnostic Agents] Rash     Family history reviewed and not pertinent Family History  Problem Relation Age of Onset  . Hypertension Mother   . Breast cancer Mother   . Stroke Mother   . Heart disease Father   . Heart attack Sister   . Hypertension Brother   . Heart attack Sister   . Colon cancer Brother 107     Prior to Admission medications   Medication Sig Start Date End Date Taking? Authorizing Provider  albuterol (ACCUNEB) 0.63 MG/3ML nebulizer solution Take 1 ampule by nebulization every 6 (six) hours as needed for wheezing.    [provider]  albuterol (PROVENTIL HFA;VENTOLIN HFA) 108 (90 Base) MCG/ACT inhaler Inhale 2 puffs into the lungs every 6 (six) hours as needed. 11/02/16   Schaevitz, Randall An, MD  allopurinol (ZYLOPRIM) 300 MG tablet Take 1 tablet (300 mg total) by mouth daily. 04/12/18   Gladstone Lighter, MD  aspirin 81 MG EC tablet Take 81 mg by mouth daily.      [provider]  budesonide-formoterol (SYMBICORT) 160-4.5 MCG/ACT inhaler Inhale 2 puffs into the lungs 2 (two) times daily.    [provider]  cephALEXin (KEFLEX) 250 MG capsule Take 1  capsule (250 mg total) by mouth 2 (two) times daily for 7 days. 12/15/19 12/22/19  Delman Kitten, MD  digoxin (LANOXIN) 0.125 MG tablet TAKE 1 TABLET BY MOUTH EVERY 3 DAYS 11/20/18   Deboraha Sprang, MD  donepezil (ARICEPT) 5 MG tablet Take 5 mg by mouth at bedtime.    [provider]  furosemide (LASIX) 20 MG tablet Take 1 tablet (20 mg total) by mouth daily as needed for up to 30 days for fluid or edema. 04/12/18 05/12/18  Gladstone Lighter, MD  gabapentin (NEURONTIN) 300 MG capsule Take 300 mg by mouth 2 (two) times daily.    [provider]  hydrOXYzine (ATARAX/VISTARIL) 25 MG tablet Take 25 mg by mouth at bedtime.    [provider]  levothyroxine (EUTHYROX) 112 MCG tablet Take 112 mcg  by mouth daily before breakfast.    [provider]  levothyroxine (SYNTHROID, LEVOTHROID) 112 MCG tablet Take 112 mcg by mouth daily. Patient not taking: Reported on 09/01/2019    [provider]  Melatonin 10 MG TABS Take 10 mg by mouth at bedtime as needed (sleep). Patient not taking: Reported on 09/01/2019    [provider]  Menthol, Topical Analgesic, (BENGAY EX) Apply 1 application topically daily as needed (back pain).    [provider]  metFORMIN (GLUCOPHAGE-XR) 500 MG 24 hr tablet Take 500 mg by mouth every morning.  03/05/18   [provider]  metoprolol succinate (TOPROL-XL) 25 MG 24 hr tablet Take 25 mg by mouth daily.    [provider]  omeprazole (PRILOSEC) 20 MG capsule Take 1 capsule (20 mg total) by mouth every morning. 04/12/18   Gladstone Lighter, MD  Sennosides (EX-LAX PO) Take 2 tablets by mouth at bedtime as needed (constipation).    [provider]  tiotropium (SPIRIVA) 18 MCG inhalation capsule Place 1 capsule (18 mcg total) into inhaler and inhale daily as needed (shortness of breath). 04/12/18   Gladstone Lighter, MD  venlafaxine (EFFEXOR) 75 MG tablet Take 75 mg by mouth 2 (two) times daily.    [provider]  vitamin B-12 (CYANOCOBALAMIN) 1000 MCG tablet Take 1,000 mcg by mouth 2 (two) times daily.     [provider]    Physical Exam: Vitals:   12/15/19 1500 12/15/19 1515 12/15/19 1530 12/15/19 1545  BP: (!) 118/59  118/68   Pulse: 99 96 97 98  Resp: (!) 21 17 19 18   Temp:      TempSrc:      SpO2: 95% 96% 97% 96%  Weight:      Height:        General: alert and oriented to place and person. Appear in mild distress, affect appropriate Eyes: PERRLA, Conjunctiva normal ENT: Oral Mucosa Clear, moist  Neck: no JVD, no Abnormal Mass Or lumps Cardiovascular: S1 and S2 Present, no Murmur, peripheral pulses symmetrical Respiratory: good respiratory effort, Bilateral Air entry equal and Decreased, no signs of accessory muscle use, Clear to Auscultation, no Crackles, no wheezes Abdomen: Bowel Sound present, Soft and no tenderness, no hernia Skin: no rashes  Extremities: no Pedal edema, no calf tenderness Neurologic: without any new focal findings Gait not checked due to patient safety concerns  Data Reviewed: I have personally reviewed and interpreted labs, imaging as discussed below.  CBC: Recent Labs  Lab 12/15/19 0722  WBC 11.4*  HGB 10.7*  HCT 36.4  MCV 89.0  PLT 235   Basic Metabolic Panel: Recent Labs  Lab 12/15/19 0722  NA 139  K 4.0  CL 97*  CO2 30  GLUCOSE 169*  BUN 17  CREATININE 1.12*  CALCIUM 9.0   GFR: Estimated Creatinine Clearance: 50.5 mL/min (A) (by C-G formula based on SCr of 1.12 mg/dL (H)). Liver Function Tests: No results for input(s): AST, ALT, ALKPHOS, BILITOT, PROT, ALBUMIN in the last 168 hours. No results for input(s): LIPASE, AMYLASE in the last 168 hours. No results for input(s): AMMONIA in the last 168 hours. Coagulation Profile: No results for input(s): INR, PROTIME in the last 168 hours. Cardiac Enzymes: No results for input(s): CKTOTAL, CKMB, CKMBINDEX, TROPONINI in the last 168 hours. BNP (last 3 results) No  results for input(s): PROBNP in the last 8760 hours. HbA1C: No results for input(s): HGBA1C in the last 72 hours. CBG: No results for  input(s): GLUCAP in the last 168 hours. Lipid Profile: No results for input(s): CHOL, HDL, LDLCALC, TRIG, CHOLHDL, LDLDIRECT in the last 72 hours. Thyroid Function Tests: No results for input(s): TSH, T4TOTAL, FREET4, T3FREE, THYROIDAB in the last 72 hours. Anemia Panel: No results for input(s): VITAMINB12, FOLATE, FERRITIN, TIBC, IRON, RETICCTPCT in the last 72 hours. Urine analysis:    Component Value Date/Time   COLORURINE YELLOW (A) 12/15/2019 0905   APPEARANCEUR CLOUDY (A) 12/15/2019 0905   APPEARANCEUR Clear 07/31/2013 1658   LABSPEC 1.023 12/15/2019 0905   LABSPEC 1.018 07/31/2013 1658   PHURINE 5.0 12/15/2019 0905   GLUCOSEU 50 (A) 12/15/2019 0905   GLUCOSEU Negative 07/31/2013 1658   HGBUR NEGATIVE 12/15/2019 0905   BILIRUBINUR NEGATIVE 12/15/2019 0905   BILIRUBINUR Negative 07/31/2013 1658   KETONESUR NEGATIVE 12/15/2019 0905   PROTEINUR 100 (A) 12/15/2019 0905   UROBILINOGEN 0.2 03/28/2012 1526   NITRITE NEGATIVE 12/15/2019 0905   LEUKOCYTESUR NEGATIVE 12/15/2019 0905   LEUKOCYTESUR Negative 07/31/2013 1658    Radiological Exams on Admission: DG Chest 1 View  Result Date: 12/15/2019 CLINICAL DATA:  Altered mental status. EXAM: CHEST  1 VIEW COMPARISON:  April 09, 2018. FINDINGS: Stable cardiomediastinal silhouette. Stable left-sided pacemaker. Both lungs are clear. The visualized skeletal structures are unremarkable. IMPRESSION: No active disease. Electronically Signed   By: Marijo Conception M.D.   On: 12/15/2019 08:49   DG Tibia/Fibula Left  Result Date: 12/15/2019 CLINICAL DATA:  Pain proximal fibular region. EXAM: LEFT TIBIA AND FIBULA - 2 VIEW COMPARISON:  Concurrent femur radiographs. FINDINGS: Normal alignment with approximation of the joints. No fracture or focal osseous lesion. Mild knee degenerative changes. Soft tissues are  unremarkable. IMPRESSION: No acute osseous abnormality.  Mild knee osteoarthrosis. Electronically Signed   By: Primitivo Gauze M.D.   On: 12/15/2019 10:49   CT Head Wo Contrast  Result Date: 12/15/2019 CLINICAL DATA:  Altered mental status EXAM: CT HEAD WITHOUT CONTRAST TECHNIQUE: Contiguous axial images were obtained from the base of the skull through the vertex without intravenous contrast. COMPARISON:  04/09/2018 FINDINGS: Brain: No evidence of acute infarction, hemorrhage, hydrocephalus, extra-axial collection or mass lesion/mass effect. Mild low-density changes within the periventricular and subcortical white matter compatible with chronic microvascular ischemic change. Mild diffuse cerebral volume loss. Vascular: Atherosclerotic calcifications involving the large vessels of the skull base. No unexpected hyperdense vessel. Skull: Normal. Negative for fracture or focal lesion. Sinuses/Orbits: No acute finding. Other: None. IMPRESSION: 1. No acute intracranial findings. 2. Mild chronic microvascular ischemic change and cerebral volume loss. Electronically Signed   By: Davina Poke D.O.   On: 12/15/2019 08:08   CT Lumbar Spine Wo Contrast  Result Date: 12/15/2019 CLINICAL DATA:  Altered mental status.  Back pain. EXAM: CT LUMBAR SPINE WITHOUT CONTRAST TECHNIQUE: Multidetector CT imaging of the lumbar spine was performed without intravenous contrast administration. Multiplanar CT image reconstructions were also generated. COMPARISON:  Radiography 06/14/2014. FINDINGS: Segmentation: 5 lumbar type vertebral bodies. Alignment: Minimal thoracolumbar curvature convex to the left and lower lumbar curvature convex to the right. 1 mm degenerative anterolisthesis L4-5. Vertebrae: Old compression deformity at L1 with loss of height of 1/3. No change since 2016. No other regional fracture. Paraspinal and other soft tissues: Aortic atherosclerosis. Otherwise negative. Disc levels: Chronic degenerative  spondylosis at L2-3. No compressive canal or foraminal stenosis. L3-4: Disc degeneration and minimal bulge but no stenosis. L4-5: Mild bulging of the disc. Bilateral facet osteoarthritis allowing 1 mm of anterolisthesis. Mild stenosis of both  lateral recesses and foramina. L5-S1: Mild bulging of the disc. Mild facet osteoarthritis. No apparent compressive stenosis. IMPRESSION: 1. No acute finding. Old compression deformity at L1 with loss of height of 1/3. 2. Lower lumbar degenerative disc disease and degenerative facet disease. Mild stenosis of the lateral recesses and foramina at L4-5 that could cause neural compression. Aortic Atherosclerosis (ICD10-I70.0). Electronically Signed   By: Nelson Chimes M.D.   On: 12/15/2019 13:00   CT PELVIS WO CONTRAST  Result Date: 12/15/2019 CLINICAL DATA:  [place].  Mental status changes.  Left hip pain. EXAM: CT PELVIS WITHOUT CONTRAST TECHNIQUE: Multidetector CT imaging of the pelvis was performed following the standard protocol without intravenous contrast. COMPARISON:  None. FINDINGS: Urinary Tract:  Bladder appears normal. Bowel: Large amount of fecal matter in the cecum. Diverticulosis of the left colon. No sign of diverticulitis. Previous rectosigmoid anastomosis. Vascular/Lymphatic: Aortic atherosclerosis. No aneurysm. No adenopathy. Reproductive: Previous hysterectomy. No pelvic mass. Small cysts of the right ovary, none larger than 1.6 cm. Other:  No free fluid or air. Musculoskeletal: No evidence of regional fracture or focal bone pathology. No evidence of joint effusion in either hip joint. No evidence of regional muscular injury. IMPRESSION: 1. No acute finding by CT. No evidence of regional fracture or focal bone pathology. No evidence of joint effusion in either hip joint. No evidence of regional muscular injury. 2. Large amount of fecal matter in the cecum. Diverticulosis of the left colon without evidence of diverticulitis. Previous rectosigmoid anastomosis.  3. Aortic atherosclerosis. Aortic Atherosclerosis (ICD10-I70.0). Electronically Signed   By: Nelson Chimes M.D.   On: 12/15/2019 13:04   DG Femur Min 2 Views Left  Result Date: 12/15/2019 CLINICAL DATA:  Left hip pain. EXAM: LEFT FEMUR 2 VIEWS COMPARISON:  None. FINDINGS: There is no evidence of fracture or other focal bone lesions. Soft tissues are unremarkable. IMPRESSION: Negative. Electronically Signed   By: Marijo Conception M.D.   On: 12/15/2019 08:49    I reviewed all nursing notes, pharmacy notes, vitals, pertinent old records.  Assessment/Plan  Active Problems:   Elevated troponin I level    # Elevated troponin could be secondary demand ischemia due to hypoxia as patient was not using oxygen since last night Patient denies any chest pain No significant EKG findings Continue to monitor on telemetry Troponin 25--78 continue to trend Continue aspirin 81 mg p.o. daily Follow 2D echocardiogram Keep n.p.o. after midnight Cardiology consult tomorrow a.m. for further work-up, quested by patient's husband   #Acute on chronic hypoxic respiratory failure, COPD, no exacerbation noticed on exam Continue supplemental O2 inhalation Continue Breo Ellipta daily and albuterol as needed   #Pain in the left knee and bilateral buttock area CT scan and x-ray negative for any acute findings Continue as needed medication for pain control Follow PT and OT eval before discharge Constipation, stool burden on the CT scan Started MiraLAX and Dulcolax daily  #HTN, chronic systolic CHF LVEF 40 to 50%, nonischemic cardiomyopathy, status post pacemaker Continue digoxin 0.125 mg every other day, patient takes Monday Wednesday and Friday, continue Toprol-XL, Lasix PTA dose Monitor blood pressure and titrate medication accordingly   # NIDDM T2, held Metformin for now Started Humalog sliding scale, monitor FSBG Continue diabetic diet   # Hypothyroid, continue with Synthroid  #Gout, continued  allopurinol  # Dementia, PTSD and depression Continue Aricept, Effexor Continue supportive care      Nutrition: Carb modified diet DVT Prophylaxis: Subcutaneous Heparin   Advance goals of care  discussion: Full code   Consults: Cardiology, please call tomorrow a.m.   Family Communication: family was not present at bedside, at the time of interview.  I called the patient's husband over the phone. Opportunity was given to ask question and all questions were answered satisfactorily.  Disposition: Admitted as observation, med-surge unit. Likely to be discharged home with [facility] PT, in 1-2 days .  I have discussed plan of care as described above with RN and patient/family.  Severity of Illness: The appropriate patient status for this patient is OBSERVATION. Observation status is judged to be reasonable and necessary in order to provide the required intensity of service to ensure the patient's safety. The patient's presenting symptoms, physical exam findings, and initial radiographic and laboratory data in the context of their medical condition is felt to place them at decreased risk for further clinical deterioration. Furthermore, it is anticipated that the patient will be medically stable for discharge from the hospital within 2 midnights of admission. The following factors support the patient status of observation.   " The patient's presenting symptoms include elevated troponin and intractable pain. " The physical exam findings include left knee pain. " The initial radiographic and laboratory data are elevated troponin.     Author: Val Riles, MD [facility] 12/15/2019 4:17 PM   To reach On-call, see care teams to locate the attending and reach out to them via [email or web address]. If 7PM-7AM, please contact night-coverage If you still have difficulty reaching the attending provider, please page the [facility] (Director on Call) for [facility] on amion for assistance.

## 2019-12-15 NOTE — ED Notes (Signed)
Pt taken to CT at this time. Pt's daughter reports that patient is on home O2, unknown how much O2 patient is on at this time.

## 2019-12-15 NOTE — ED Notes (Signed)
Pt placed on 2L at this time. Pt denies home oxygen use.
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
## 2019-12-16 ENCOUNTER — Observation Stay (HOSPITAL_COMMUNITY)
Admit: 2019-12-16 | Discharge: 2019-12-16 | Disposition: A | Discharge status: Home / Self Care | Payer: Medicare Other | Attending: Student | Admitting: Student

## 2019-12-16 ENCOUNTER — Observation Stay: Payer: Medicare Other

## 2019-12-16 DIAGNOSIS — R279 Unspecified lack of coordination: Secondary | ICD-10-CM | POA: Diagnosis not present

## 2019-12-16 DIAGNOSIS — R7989 Other specified abnormal findings of blood chemistry: Secondary | ICD-10-CM | POA: Diagnosis not present

## 2019-12-16 DIAGNOSIS — R0902 Hypoxemia: Secondary | ICD-10-CM

## 2019-12-16 DIAGNOSIS — R488 Other symbolic dysfunctions: Secondary | ICD-10-CM | POA: Diagnosis not present

## 2019-12-16 DIAGNOSIS — Z961 Presence of intraocular lens: Secondary | ICD-10-CM | POA: Diagnosis present

## 2019-12-16 DIAGNOSIS — Z9842 Cataract extraction status, left eye: Secondary | ICD-10-CM | POA: Diagnosis not present

## 2019-12-16 DIAGNOSIS — R498 Other voice and resonance disorders: Secondary | ICD-10-CM | POA: Diagnosis not present

## 2019-12-16 DIAGNOSIS — Z91041 Radiographic dye allergy status: Secondary | ICD-10-CM | POA: Diagnosis not present

## 2019-12-16 DIAGNOSIS — R5381 Other malaise: Secondary | ICD-10-CM

## 2019-12-16 DIAGNOSIS — R778 Other specified abnormalities of plasma proteins: Secondary | ICD-10-CM | POA: Diagnosis not present

## 2019-12-16 DIAGNOSIS — F0391 Unspecified dementia with behavioral disturbance: Secondary | ICD-10-CM | POA: Diagnosis not present

## 2019-12-16 DIAGNOSIS — S82115A Nondisplaced fracture of left tibial spine, initial encounter for closed fracture: Secondary | ICD-10-CM | POA: Diagnosis not present

## 2019-12-16 DIAGNOSIS — Z9981 Dependence on supplemental oxygen: Secondary | ICD-10-CM | POA: Diagnosis not present

## 2019-12-16 DIAGNOSIS — J9621 Acute and chronic respiratory failure with hypoxia: Secondary | ICD-10-CM | POA: Diagnosis present

## 2019-12-16 DIAGNOSIS — R4182 Altered mental status, unspecified: Secondary | ICD-10-CM | POA: Diagnosis not present

## 2019-12-16 DIAGNOSIS — I10 Essential (primary) hypertension: Secondary | ICD-10-CM | POA: Diagnosis not present

## 2019-12-16 DIAGNOSIS — S82112A Displaced fracture of left tibial spine, initial encounter for closed fracture: Secondary | ICD-10-CM | POA: Diagnosis present

## 2019-12-16 DIAGNOSIS — I429 Cardiomyopathy, unspecified: Secondary | ICD-10-CM | POA: Diagnosis not present

## 2019-12-16 DIAGNOSIS — W19XXXA Unspecified fall, initial encounter: Secondary | ICD-10-CM | POA: Diagnosis present

## 2019-12-16 DIAGNOSIS — M5136 Other intervertebral disc degeneration, lumbar region: Secondary | ICD-10-CM | POA: Diagnosis not present

## 2019-12-16 DIAGNOSIS — F0150 Vascular dementia without behavioral disturbance: Secondary | ICD-10-CM | POA: Diagnosis not present

## 2019-12-16 DIAGNOSIS — M25562 Pain in left knee: Secondary | ICD-10-CM | POA: Diagnosis present

## 2019-12-16 DIAGNOSIS — J439 Emphysema, unspecified: Secondary | ICD-10-CM | POA: Diagnosis not present

## 2019-12-16 DIAGNOSIS — R2681 Unsteadiness on feet: Secondary | ICD-10-CM | POA: Diagnosis not present

## 2019-12-16 DIAGNOSIS — M25461 Effusion, right knee: Secondary | ICD-10-CM | POA: Diagnosis not present

## 2019-12-16 DIAGNOSIS — S32010S Wedge compression fracture of first lumbar vertebra, sequela: Secondary | ICD-10-CM | POA: Diagnosis not present

## 2019-12-16 DIAGNOSIS — F32A Depression, unspecified: Secondary | ICD-10-CM | POA: Diagnosis present

## 2019-12-16 DIAGNOSIS — Z20822 Contact with and (suspected) exposure to covid-19: Secondary | ICD-10-CM | POA: Diagnosis present

## 2019-12-16 DIAGNOSIS — J99 Respiratory disorders in diseases classified elsewhere: Secondary | ICD-10-CM | POA: Diagnosis not present

## 2019-12-16 DIAGNOSIS — M4856XA Collapsed vertebra, not elsewhere classified, lumbar region, initial encounter for fracture: Secondary | ICD-10-CM | POA: Diagnosis present

## 2019-12-16 DIAGNOSIS — I4891 Unspecified atrial fibrillation: Secondary | ICD-10-CM | POA: Diagnosis not present

## 2019-12-16 DIAGNOSIS — I5022 Chronic systolic (congestive) heart failure: Secondary | ICD-10-CM | POA: Diagnosis present

## 2019-12-16 DIAGNOSIS — Z885 Allergy status to narcotic agent status: Secondary | ICD-10-CM | POA: Diagnosis not present

## 2019-12-16 DIAGNOSIS — S82115B Nondisplaced fracture of left tibial spine, initial encounter for open fracture type I or II: Secondary | ICD-10-CM

## 2019-12-16 DIAGNOSIS — E119 Type 2 diabetes mellitus without complications: Secondary | ICD-10-CM | POA: Diagnosis not present

## 2019-12-16 DIAGNOSIS — I110 Hypertensive heart disease with heart failure: Secondary | ICD-10-CM | POA: Diagnosis present

## 2019-12-16 DIAGNOSIS — M6281 Muscle weakness (generalized): Secondary | ICD-10-CM | POA: Diagnosis not present

## 2019-12-16 DIAGNOSIS — Z823 Family history of stroke: Secondary | ICD-10-CM | POA: Diagnosis not present

## 2019-12-16 DIAGNOSIS — E569 Vitamin deficiency, unspecified: Secondary | ICD-10-CM | POA: Diagnosis not present

## 2019-12-16 DIAGNOSIS — Z8249 Family history of ischemic heart disease and other diseases of the circulatory system: Secondary | ICD-10-CM | POA: Diagnosis not present

## 2019-12-16 DIAGNOSIS — K219 Gastro-esophageal reflux disease without esophagitis: Secondary | ICD-10-CM | POA: Diagnosis present

## 2019-12-16 DIAGNOSIS — F4310 Post-traumatic stress disorder, unspecified: Secondary | ICD-10-CM | POA: Diagnosis present

## 2019-12-16 DIAGNOSIS — E039 Hypothyroidism, unspecified: Secondary | ICD-10-CM | POA: Diagnosis present

## 2019-12-16 DIAGNOSIS — I2510 Atherosclerotic heart disease of native coronary artery without angina pectoris: Secondary | ICD-10-CM | POA: Diagnosis not present

## 2019-12-16 DIAGNOSIS — J449 Chronic obstructive pulmonary disease, unspecified: Secondary | ICD-10-CM | POA: Diagnosis present

## 2019-12-16 DIAGNOSIS — E1165 Type 2 diabetes mellitus with hyperglycemia: Secondary | ICD-10-CM | POA: Diagnosis present

## 2019-12-16 DIAGNOSIS — Z87891 Personal history of nicotine dependence: Secondary | ICD-10-CM | POA: Diagnosis not present

## 2019-12-16 DIAGNOSIS — M25561 Pain in right knee: Secondary | ICD-10-CM | POA: Diagnosis not present

## 2019-12-16 DIAGNOSIS — Z9841 Cataract extraction status, right eye: Secondary | ICD-10-CM | POA: Diagnosis not present

## 2019-12-16 DIAGNOSIS — I248 Other forms of acute ischemic heart disease: Secondary | ICD-10-CM | POA: Diagnosis present

## 2019-12-16 DIAGNOSIS — M1A9XX0 Chronic gout, unspecified, without tophus (tophi): Secondary | ICD-10-CM | POA: Diagnosis present

## 2019-12-16 LAB — ECHOCARDIOGRAM COMPLETE
AR max vel: 2.45 cm2
AV Area VTI: 2.7 cm2
AV Area mean vel: 2.06 cm2
AV Mean grad: 4 mmHg
AV Peak grad: 7.6 mmHg
Ao pk vel: 1.38 m/s
Area-P 1/2: 7.27 cm2
Height: 63 in
S' Lateral: 4.29 cm
Weight: 3220.48 oz

## 2019-12-16 LAB — URINE CULTURE: Culture: NO GROWTH

## 2019-12-16 LAB — GLUCOSE, CAPILLARY
Glucose-Capillary: 120 mg/dL — ABNORMAL HIGH (ref 70–99)
Glucose-Capillary: 129 mg/dL — ABNORMAL HIGH (ref 70–99)
Glucose-Capillary: 115 mg/dL — ABNORMAL HIGH (ref 70–99)
Glucose-Capillary: 146 mg/dL — ABNORMAL HIGH (ref 70–99)

## 2019-12-16 LAB — URIC ACID: Uric Acid, Serum: 5.1 mg/dL (ref 2.5–7.1)

## 2019-12-16 MED ORDER — OXYCODONE HCL 5 MG PO TABS
5.0000 mg | ORAL_TABLET | Freq: Four times a day (QID) | ORAL | Status: DC | PRN
  Administered 2019-12-16 – 2019-12-21 (×10): 5 mg via ORAL
  Filled 2019-12-16 (×10): qty 1

## 2019-12-16 MED ORDER — PERFLUTREN LIPID MICROSPHERE
1.0000 mL | INTRAVENOUS | Status: AC | PRN
  Administered 2019-12-16: 2 mL via INTRAVENOUS
  Filled 2019-12-16: qty 10
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
## 2019-12-16 NOTE — Progress Notes (Signed)
*  PRELIMINARY RESULTS* Echocardiogram 2D Echocardiogram has been performed.  Stacy Grimes Stacy Grimes 12/16/2019, 9:22 AM

## 2019-12-16 NOTE — Progress Notes (Signed)
Pt resting comfortably in bed Assessment completed as charted Husband at bedside Updated on plan of care Ice pack placed to left knee. Pain controlled with prn medication. Remains on 4L [place] Alert and oriented x2-3 Denies any additional wants or needs at this time  Call bell remains within reach will continue to closely monitor.

## 2019-12-16 NOTE — TOC Progression Note (Signed)
Transition of Care [facility]) - Progression Note    Patient Details  Name: Stacy Grimes MRN: [id number] Date of Birth: 11-23-1950  [facility]) CM/SW [place], RN Phone Number: 12/16/2019, 3:44 PM  Clinical Narrative:     Spoke with husband [name] on the phone, he prefers patient, K. Bradeen, to return home with home health services.  These services have been set up and verified with [place].    Expected Discharge Plan: [place] Barriers to Discharge: Continued Medical Work up  Expected Discharge Plan and Services Expected Discharge Plan: [place] In-house Referral: Clinical Social Work   Post Acute Care Choice: [place] arrangements for the past 2 months: [place]: RN, PT, OT, Nurse's Aide [place] Agency: [place] ([facility]) Date [place]: 12/16/19 Time [place]: 1544 Representative spoke with at [place]: [place] (SDOH) Interventions    Readmission Risk Interventions No flowsheet data found.

## 2019-12-16 NOTE — Progress Notes (Signed)
PROGRESS NOTE  Stacy Grimes FGH:[id number] DOB: 14-Oct-1950   PCP: Cyndi Bender, PA-C  Patient is from: Home.  Lives with husband.  Oriented x2 at baseline.  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  She also had pain in her left knee and buttock area.   In ED, mild tachycardia, tachypnea and soft BP.  83% on RA but recovered to 95% on 4 L.  BMP and CBC without significant finding.  Limited RVP nonreactive.  CT head, lumbar spine and hip without acute finding.  DG left femur and tibia and fibula without significant finding.  Troponin 25>> 78.  Twelve-lead EKG without significant finding.  BMP within normal.  UA without significant finding.  Admitted for altered mental status and elevated troponin and left leg pain.   Of note, patient's husband reports fall about 3 weeks ago when "buckled" her left knee.  She has not been able to ambulate well since then.  Also increased confusion from her baseline since then.   Subjective: Seen and examined earlier this morning.  No major events overnight of this morning.  She was a sleepy but wakes to voice easily.  She reports left leg pain and she points at her left thigh and knee area.  Husband reports a fall about 3 weeks ago when she "buckled" her left knee.  She has not been able to ambulate well since then.  She was also more confused.   Objective: Vitals:   12/15/19 2038 12/16/19 0400 12/16/19 0440 12/16/19 0828  BP: (!) 151/80  (!) 112/58 (!) 133/57  Pulse: (!) 106  100 99  Resp: 18  18 16   Temp: 98.8 F (37.1 C)  98.3 F (36.8 C) 98 F (36.7 C)  TempSrc: Oral  Oral Oral  SpO2: 96%  92% 95%  Weight:  91.3 kg    Height:         Intake/Output Summary (Last 24 hours) at 12/16/2019 1335 Last data filed at 12/16/2019 0835 Gross per 24 hour  Intake --  Output 275 ml  Net -275 ml   Filed Weights   12/15/19 0715 12/16/19 0400  Weight: 92.3 kg 91.3 kg    Examination:  GENERAL: No apparent distress.  Nontoxic. HEENT: MMM.  Vision and hearing grossly intact.  NECK: Supple.  No apparent JVD.  RESP:  No IWOB.  Fair aeration bilaterally. CVS:  RRR. Heart sounds normal.  ABD/GI/GU: BS+. Abd soft, NTND.  MSK/EXT:  Moves extremities.  Weakness in left leg mainly due to pain.  Limited range of motion in left knee.  Tenderness across the joint lines, mainly medially.  Significant pain with valgus SKIN: no apparent skin lesion or wound NEURO: Awake, alert and oriented self and partial place.  No apparent focal neuro deficit. PSYCH: Calm. Normal affect.   Procedures:  None  Microbiology summarized: Limited RVP nonreactive.  Assessment & Plan: Pain in the left knee and bilateral buttock area-husband reports fall about 3 weeks ago when she "buckled" her left knee.  She has very limited range of motion in left knee.  Also tenderness across the joint lines mainly medially.  Significant pain with valgus.  Uric acid within normal.  CT lumbar spine  and hip, and DG left femur and tibia and fibula without acute finding.  She has old L1 compression fracture with one third of weight loss. -Pain control with scheduled Tylenol, home gabapentin, as needed oxycodone and IV morphine -Check left knee x-ray.  May need CT depending on x-ray.  She has CRT-D/pacemaker for MRI. -Consult orthopedic surgery after imaging. -PT/OT  Elevated troponin: 25> 78> 84> 73.  Likely demand ischemia from acute hypoxic respiratory failure.  EKG is reassuring.  No cardiopulmonary symptoms.  BNP within normal. -Follow TTE -Doubt need for further cardiac work-up unless significant finding on TTE.  Acute on chronic hypoxic respiratory failure/chronic  COPD: Likely from not using home oxygen.  She is on 4 L at baseline.  No signs of CHF or COPD exacerbation.  CXR reassuring.  BNP within normal. -Continue supplemental oxygen -Continue home Breo Ellipta and also inhalers. -Minimum oxygen to keep saturation above 90%.  Chronic systolic CHF/N ICM s/p CRT-D: TTE in 04/2018 LVEF 40 to 45%.  BNP within normal.  No cardiopulmonary symptoms.  Appears euvolemic. -Continue home medications -Monitor fluid status  Essential hypertension: Normotensive. -Continue current regimen  Uncontrolled NIDDM-2 with hyperglycemia: A1c 7.3%. Recent Labs  Lab 12/15/19 1743 12/15/19 1846 12/15/19 2045 12/16/19 0849 12/16/19 1129  GLUCAP 162* 155* 100* 120* 129*  -Continue current insulin regimen  Dementia with behavioral disturbance: Reportedly found wandering in friend's backyard.  Currently oriented to self and partial place. -Reorientation and delirium precautions. -Avoid or minimize sedating medications.  Hypothyroidism: Stable -Continue home Synthroid.  Chronic gout: reports left knee pain -Check uric acid -Continue home allopurinol  History of PTSD/depression/anxiety: Somewhat stable. -Continue home medications  Debility/physical deconditioning: -Therapy recommending SNF.   Body mass index is 35.66 kg/m.         DVT prophylaxis:  Subcu Lovenox  Code Status: Full code Family Communication: Patient and/or RN.  Attempted to call patient's husband over his cell phone and home phone but no answer.  Status is: Observation  The patient will require care spanning > 2 midnights and should be moved to inpatient because: Ongoing active pain requiring inpatient pain management, Ongoing diagnostic testing needed not appropriate for outpatient work up, Unsafe d/c plan and Inpatient level of care appropriate due to severity of illness  Dispo: The patient is from: Home              Anticipated d/c is to: Home              Anticipated d/c  date is: 2 days              Patient currently is not medically stable to d/c.       Consultants:  None   Sch Meds:  Scheduled Meds:  allopurinol  300 mg Oral Daily   aspirin EC  81 mg Oral Daily   bisacodyl  10 mg Oral QHS   digoxin  0.125 mg Oral QODAY   donepezil  5 mg Oral QHS   enoxaparin (LOVENOX) injection  45 mg Subcutaneous Q24H   fluticasone furoate-vilanterol  1 puff Inhalation Daily   gabapentin  300 mg Oral BID   hydrOXYzine  25 mg Oral QHS   insulin aspart  0-15 Units Subcutaneous TID WC   levothyroxine  112 mcg Oral QAC breakfast   metoprolol succinate  25 mg Oral Daily   pantoprazole  40 mg Oral Daily   polyethylene glycol  17 g Oral Daily   venlafaxine  75 mg Oral BID  vitamin B-12  1,000 mcg Oral BID   Continuous Infusions: PRN Meds:.acetaminophen, albuterol, furosemide, melatonin, ondansetron (ZOFRAN) IV, oxyCODONE, tiotropium  Antimicrobials: Anti-infectives (From admission, onward)   Start     Dose/Rate Route Frequency Ordered Stop   12/15/19 1245  cephALEXin (KEFLEX) capsule 250 mg        250 mg Oral  Once 12/15/19 1244 12/15/19 1312   12/15/19 0000  cephALEXin (KEFLEX) 250 MG capsule        250 mg Oral 2 times daily 12/15/19 1249 12/22/19 2359       I have personally reviewed the following labs and images: CBC: Recent Labs  Lab 12/15/19 0722  WBC 11.4*  HGB 10.7*  HCT 36.4  MCV 89.0  PLT 366   BMP &GFR Recent Labs  Lab 12/15/19 0722  NA 139  K 4.0  CL 97*  CO2 30  GLUCOSE 169*  BUN 17  CREATININE 1.12*  CALCIUM 9.0   Estimated Creatinine Clearance: 50.2 mL/min (A) (by C-G formula based on SCr of 1.12 mg/dL (H)). Liver & Pancreas: No results for input(s): AST, ALT, ALKPHOS, BILITOT, PROT, ALBUMIN in the last 168 hours. No results for input(s): LIPASE, AMYLASE in the last 168 hours. No results for input(s): AMMONIA in the last 168 hours. Diabetic: Recent Labs    12/15/19 1704  HGBA1C 7.3*   Recent  Labs  Lab 12/15/19 1743 12/15/19 1846 12/15/19 2045 12/16/19 0849 12/16/19 1129  GLUCAP 162* 155* 100* 120* 129*   Cardiac Enzymes: No results for input(s): CKTOTAL, CKMB, CKMBINDEX, TROPONINI in the last 168 hours. No results for input(s): PROBNP in the last 8760 hours. Coagulation Profile: No results for input(s): INR, PROTIME in the last 168 hours. Thyroid Function Tests: No results for input(s): TSH, T4TOTAL, FREET4, T3FREE, THYROIDAB in the last 72 hours. Lipid Profile: No results for input(s): CHOL, HDL, LDLCALC, TRIG, CHOLHDL, LDLDIRECT in the last 72 hours. Anemia Panel: No results for input(s): VITAMINB12, FOLATE, FERRITIN, TIBC, IRON, RETICCTPCT in the last 72 hours. Urine analysis:    Component Value Date/Time   COLORURINE YELLOW (A) 12/15/2019 0905   APPEARANCEUR CLOUDY (A) 12/15/2019 0905   APPEARANCEUR Clear 07/31/2013 1658   LABSPEC 1.023 12/15/2019 0905   LABSPEC 1.018 07/31/2013 1658   PHURINE 5.0 12/15/2019 0905   GLUCOSEU 50 (A) 12/15/2019 0905   GLUCOSEU Negative 07/31/2013 1658   HGBUR NEGATIVE 12/15/2019 0905   BILIRUBINUR NEGATIVE 12/15/2019 0905   BILIRUBINUR Negative 07/31/2013 1658   KETONESUR NEGATIVE 12/15/2019 0905   PROTEINUR 100 (A) 12/15/2019 0905   UROBILINOGEN 0.2 03/28/2012 1526   NITRITE NEGATIVE 12/15/2019 0905   LEUKOCYTESUR NEGATIVE 12/15/2019 0905   LEUKOCYTESUR Negative 07/31/2013 1658   Sepsis Labs: Invalid input(s): PROCALCITONIN, [place]  Microbiology: Recent Results (from the past 240 hour(s))  Respiratory Panel by RT PCR (Flu A&B, Covid) - Nasopharyngeal Swab     Status: None   Collection Time: 12/15/19  7:22 AM   Specimen: Nasopharyngeal Swab  Result Value Ref Range Status   SARS Coronavirus 2 by RT PCR NEGATIVE NEGATIVE Final    Comment: (NOTE) SARS-CoV-2 target nucleic acids are NOT DETECTED.  The SARS-CoV-2 RNA is generally detectable in upper respiratoy specimens during the acute phase of infection. The  lowest concentration of SARS-CoV-2 viral copies this assay can detect is 131 copies/mL. A negative result does not preclude SARS-Cov-2 infection and should not be used as the sole basis for treatment or other patient management decisions. A negative result may occur with  improper specimen collection/handling, submission of specimen other than nasopharyngeal swab, presence of viral mutation(s) within the areas targeted by this assay, and inadequate number of viral copies (<131 copies/mL). A negative result must be combined with clinical observations, patient history, and epidemiological information. The expected result is Negative.  Fact Sheet for Patients:  PinkCheek.be  Fact Sheet for Healthcare Providers:  GravelBags.it  This test is no t yet approved or cleared by the [place] FDA and  has been authorized for detection and/or diagnosis of SARS-CoV-2 by FDA under an Emergency Use Authorization (EUA). This EUA will remain  in effect (meaning this test can be used) for the duration of the COVID-19 declaration under Section 564(b)(1) of the Act, 21 U.S.C. section 360bbb-3(b)(1), unless the authorization is terminated or revoked sooner.     Influenza A by PCR NEGATIVE NEGATIVE Final   Influenza B by PCR NEGATIVE NEGATIVE Final    Comment: (NOTE) The Xpert Xpress SARS-CoV-2/FLU/RSV assay is intended as an aid in  the diagnosis of influenza from Nasopharyngeal swab specimens and  should not be used as a sole basis for treatment. Nasal washings and  aspirates are unacceptable for Xpert Xpress SARS-CoV-2/FLU/RSV  testing.  Fact Sheet for Patients: PinkCheek.be  Fact Sheet for Healthcare Providers: GravelBags.it  This test is not yet approved or cleared by the [place] FDA and  has been authorized for detection and/or diagnosis of SARS-CoV-2 by  FDA under  an Emergency Use Authorization (EUA). This EUA will remain  in effect (meaning this test can be used) for the duration of the  Covid-19 declaration under Section 564(b)(1) of the Act, 21  U.S.C. section 360bbb-3(b)(1), unless the authorization is  terminated or revoked. Performed at [facility], [address], [place], [place] [address]     Radiology Studies: No results found.    Noris Kulinski T. Shelby  If 7PM-7AM, please contact night-coverage [email or web address] 12/16/2019, 1:35 PM

## 2019-12-16 NOTE — Progress Notes (Signed)
Pt finally resting quietly in bed with eyes closed.  Resp even and unlabored.  Pt was moaning and yelling earlier in night for extended period of time about leg and knee pain.  See meds given.
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
## 2019-12-16 NOTE — Progress Notes (Signed)
Ch visited Pt in response to OR. Ch checked in on Pt and provided some pastoral support. Pt was about to eat dinner. Pt will let RN know to request Ch support if needed.

## 2019-12-17 DIAGNOSIS — S82115B Nondisplaced fracture of left tibial spine, initial encounter for open fracture type I or II: Secondary | ICD-10-CM | POA: Diagnosis not present

## 2019-12-17 DIAGNOSIS — R778 Other specified abnormalities of plasma proteins: Secondary | ICD-10-CM | POA: Diagnosis not present

## 2019-12-17 DIAGNOSIS — F0391 Unspecified dementia with behavioral disturbance: Secondary | ICD-10-CM | POA: Diagnosis not present

## 2019-12-17 DIAGNOSIS — M25562 Pain in left knee: Secondary | ICD-10-CM | POA: Diagnosis not present

## 2019-12-17 LAB — GLUCOSE, CAPILLARY
Glucose-Capillary: 138 mg/dL — ABNORMAL HIGH (ref 70–99)
Glucose-Capillary: 157 mg/dL — ABNORMAL HIGH (ref 70–99)
Glucose-Capillary: 155 mg/dL — ABNORMAL HIGH (ref 70–99)

## 2019-12-17 MED ORDER — BUPIVACAINE HCL (PF) 0.5 % IJ SOLN
10.0000 mL | Freq: Once | INTRAMUSCULAR | Status: DC
  Filled 2019-12-17: qty 10

## 2019-12-17 MED ORDER — OXYCODONE HCL 5 MG PO TABS: 5.0000 mg | ORAL_TABLET | Freq: Four times a day (QID) | ORAL | 0 refills | Status: AC | PRN

## 2019-12-17 MED ORDER — TRIAMCINOLONE ACETONIDE 40 MG/ML IJ SUSP
40.0000 mg | Freq: Once | INTRAMUSCULAR | Status: DC
  Filled 2019-12-17: qty 1

## 2019-12-17 MED ORDER — FUROSEMIDE 20 MG PO TABS: 20.0000 mg | ORAL_TABLET | Freq: Every day | ORAL | 1 refills | Status: DC | PRN

## 2019-12-17 MED ORDER — ACETAMINOPHEN 500 MG PO TABS: 1000.0000 mg | ORAL_TABLET | Freq: Three times a day (TID) | ORAL | 0 refills | Status: DC | PRN

## 2019-12-17 NOTE — Progress Notes (Signed)
PROGRESS NOTE  Stacy Grimes SVX:[id number] DOB: 1950/04/01   PCP: Cyndi Bender, PA-C  Patient is from: Home.  Lives with husband.  Oriented x2 at baseline.  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  She also had pain in her left knee and buttock area.   In ED, mild tachycardia, tachypnea and soft BP.  83% on RA but recovered to 95% on 4 L.  BMP and CBC without significant finding.  Limited RVP nonreactive.  CT head, lumbar spine and hip without acute finding.  DG left femur and tibia and fibula without significant finding.  Troponin 25>> 78.  Twelve-lead EKG without significant finding.  BMP within normal.  UA without significant finding.  Admitted for altered mental status and elevated troponin and left leg pain.   Of note, patient's husband reports fall about 3 weeks ago when "buckled" her left knee.  She has not been able to ambulate well since then.  Also increased confusion from her baseline since then.  Left knee x-ray with spine fracture.  Orthopedic surgery consulted and recommended nonoperative management and knee injection that she refused.  Ortho recommended WBAT, PT and outpatient follow-up if no improvement.   Therapy recommending SNF.  Subjective: Seen and examined earlier this morning.  No major events overnight of this morning.  Continues to endorse pain in her left knee and left leg.  She is not a great historian.  She is oriented to self and partial place.  Denies chest pain, dyspnea, GI or UTI symptoms.  Patient's husband at bedside.  Objective: Vitals:   12/16/19 1934 12/17/19 0444 12/17/19 0753 12/17/19 1116  BP: (!) 110/59 (!) 85/77 140/67 (!) 135/56    Pulse: (!) 105 94 99 98  Resp: 18 18 17 17   Temp: 98.7 F (37.1 C) 98.1 F (36.7 C) 98.2 F (36.8 C) 98.9 F (37.2 C)  TempSrc: Oral Oral Oral Oral  SpO2: 96% 91% 95% 92%  Weight:  92 kg    Height:        Intake/Output Summary (Last 24 hours) at 12/17/2019 1240 Last data filed at 12/17/2019 0950 Gross per 24 hour  Intake 240 ml  Output 400 ml  Net -160 ml   Filed Weights   12/15/19 0715 12/16/19 0400 12/17/19 0444  Weight: 92.3 kg 91.3 kg 92 kg    Examination:  GENERAL: No apparent distress.  Nontoxic. HEENT: MMM.  Vision and hearing grossly intact.  NECK: Supple.  No apparent JVD.  RESP: On 4 L.  No IWOB.  Fair aeration bilaterally. CVS:  RRR. Heart sounds normal.  ABD/GI/GU: BS+. Abd soft, NTND.  MSK/EXT:  Moves extremities but some weakness in LLE partly due to pain.  Tenderness at left knee.  Mild swelling in left knee. SKIN: no apparent skin lesion or wound NEURO: Awake and alert.  Oriented to self and partial place.  No apparent focal neuro deficit other than some LLE weakness partly due to left knee pain. PSYCH: Calm. Normal affect.  Procedures:  None  Microbiology summarized: Limited RVP nonreactive.  Assessment & Plan: Acute left tibial spine fracture/left knee pain: knee x-ray reveals acute left tibial spine fracture.  CT lumbar spine and hip, and  DG left femur and tibia and fibula without acute finding.  She has old L1 compression fracture with one third of weight loss. -Appreciate orthopedic surgery input-conservative care (pain control, WBAT and PT) and outpatient follow up -Pain control with scheduled Tylenol, home gabapentin and as needed oxycodone -PT/OT  Elevated troponin: 25> 78> 84> 73.  Likely demand ischemia from acute hypoxic respiratory failure.  EKG is reassuring.  No cardiopulmonary symptoms.  BNP within normal.  TTE with EF of 35-40% (previously 40 to 45%), global hypokinesis and indeterminate diastolic function.  No cardiopulmonary  symptoms. -Home cardiac medications.  Chronic systolic CHF/N ICM s/p CRT-D: TTE as above.  BNP within normal.  No cardiopulmonary symptoms.  Appears euvolemic and compensated.. -Continue home medications -Monitor fluid status   Acute on chronic hypoxic respiratory failure/chronic COPD: Likely from not using home oxygen.  She is on 4 L at baseline.  CXR reassuring.  BNP within normal.  TTE as above. -Continue supplemental oxygen -Continue home Breo Ellipta and also inhalers. -Minimum oxygen to keep saturation above 90%.   Essential hypertension: Normotensive. -Continue current regimen  Uncontrolled NIDDM-2 with hyperglycemia: A1c 7.3%. Recent Labs  Lab 12/16/19 1129 12/16/19 1648 12/16/19 2021 12/17/19 0755 12/17/19 1117  GLUCAP 129* 115* 146* 138* 157*  -Continue current insulin regimen  Dementia with behavioral disturbance: Reportedly found wandering in friend's backyard.  Currently oriented to self and partial place. -Reorientation and delirium precautions. -Avoid or minimize sedating medications.  Hypothyroidism: Stable -Continue home Synthroid.  Chronic gout: reports left knee pain.  Uric acid within normal. -Continue home allopurinol  History of PTSD/depression/anxiety: Somewhat stable. -Continue home medications  Debility/physical deconditioning: -Therapy recommending SNF.   Body mass index is 35.92 kg/m.         DVT prophylaxis:  Subcu Lovenox  Code Status: Full code Family Communication: Patient and/or RN.  Attempted to call patient's husband over his cell phone and home phone but no answer.  Status is: Inpatient  Remains inpatient appropriate because:Unsafe d/c plan   Dispo: The patient is from: Home              Anticipated d/c is to: SNF              Anticipated d/c date is: 3 days              Patient currently is medically stable to d/c.           Consultants:  Orthopedic surgery   Sch Meds:  Scheduled Meds: .  allopurinol  300 mg Oral Daily  . aspirin EC  81 mg Oral Daily  . bisacodyl  10 mg Oral QHS  . digoxin  0.125 mg Oral QODAY  . donepezil  5 mg Oral QHS  . enoxaparin (LOVENOX) injection  45 mg Subcutaneous Q24H  . fluticasone furoate-vilanterol  1 puff Inhalation Daily  . gabapentin  300 mg Oral BID  . hydrOXYzine  25 mg Oral QHS  . insulin aspart  0-15 Units Subcutaneous TID WC  . levothyroxine  112 mcg Oral QAC breakfast  . metoprolol succinate  25 mg Oral Daily  . pantoprazole  40 mg Oral Daily  . polyethylene glycol  17 g Oral Daily  . venlafaxine  75 mg Oral BID  . vitamin B-12  1,000 mcg Oral BID   Continuous Infusions: PRN Meds:.acetaminophen, albuterol, furosemide, melatonin, ondansetron (ZOFRAN) IV, oxyCODONE, tiotropium  Antimicrobials: Anti-infectives (From admission, onward)   Start     Dose/Rate Route Frequency Ordered Stop  12/15/19 1245  cephALEXin (KEFLEX) capsule 250 mg        250 mg Oral  Once 12/15/19 1244 12/15/19 1312   12/15/19 0000  cephALEXin (KEFLEX) 250 MG capsule  Status:  Discontinued        250 mg Oral 2 times daily 12/15/19 1249 12/17/19        I have personally reviewed the following labs and images: CBC: Recent Labs  Lab 12/15/19 0722  WBC 11.4*  HGB 10.7*  HCT 36.4  MCV 89.0  PLT 366   BMP &GFR Recent Labs  Lab 12/15/19 0722  NA 139  K 4.0  CL 97*  CO2 30  GLUCOSE 169*  BUN 17  CREATININE 1.12*  CALCIUM 9.0   Estimated Creatinine Clearance: 50.3 mL/min (A) (by C-G formula based on SCr of 1.12 mg/dL (H)). Liver & Pancreas: No results for input(s): AST, ALT, ALKPHOS, BILITOT, PROT, ALBUMIN in the last 168 hours. No results for input(s): LIPASE, AMYLASE in the last 168 hours. No results for input(s): AMMONIA in the last 168 hours. Diabetic: Recent Labs    12/15/19 1704  HGBA1C 7.3*   Recent Labs  Lab 12/16/19 1129 12/16/19 1648 12/16/19 2021 12/17/19 0755 12/17/19 1117  GLUCAP 129* 115* 146* 138* 157*   Cardiac  Enzymes: No results for input(s): CKTOTAL, CKMB, CKMBINDEX, TROPONINI in the last 168 hours. No results for input(s): PROBNP in the last 8760 hours. Coagulation Profile: No results for input(s): INR, PROTIME in the last 168 hours. Thyroid Function Tests: No results for input(s): TSH, T4TOTAL, FREET4, T3FREE, THYROIDAB in the last 72 hours. Lipid Profile: No results for input(s): CHOL, HDL, LDLCALC, TRIG, CHOLHDL, LDLDIRECT in the last 72 hours. Anemia Panel: No results for input(s): VITAMINB12, FOLATE, FERRITIN, TIBC, IRON, RETICCTPCT in the last 72 hours. Urine analysis:    Component Value Date/Time   COLORURINE YELLOW (A) 12/15/2019 0905   APPEARANCEUR CLOUDY (A) 12/15/2019 0905   APPEARANCEUR Clear 07/31/2013 1658   LABSPEC 1.023 12/15/2019 0905   LABSPEC 1.018 07/31/2013 1658   PHURINE 5.0 12/15/2019 0905   GLUCOSEU 50 (A) 12/15/2019 0905   GLUCOSEU Negative 07/31/2013 1658   HGBUR NEGATIVE 12/15/2019 0905   BILIRUBINUR NEGATIVE 12/15/2019 0905   BILIRUBINUR Negative 07/31/2013 1658   KETONESUR NEGATIVE 12/15/2019 0905   PROTEINUR 100 (A) 12/15/2019 0905   UROBILINOGEN 0.2 03/28/2012 1526   NITRITE NEGATIVE 12/15/2019 0905   LEUKOCYTESUR NEGATIVE 12/15/2019 0905   LEUKOCYTESUR Negative 07/31/2013 1658   Sepsis Labs: Invalid input(s): PROCALCITONIN, [place]  Microbiology: Recent Results (from the past 240 hour(s))  Respiratory Panel by RT PCR (Flu A&B, Covid) - Nasopharyngeal Swab     Status: None   Collection Time: 12/15/19  7:22 AM   Specimen: Nasopharyngeal Swab  Result Value Ref Range Status   SARS Coronavirus 2 by RT PCR NEGATIVE NEGATIVE Final    Comment: (NOTE) SARS-CoV-2 target nucleic acids are NOT DETECTED.  The SARS-CoV-2 RNA is generally detectable in upper respiratoy specimens during the acute phase of infection. The lowest concentration of SARS-CoV-2 viral copies this assay can detect is 131 copies/mL. A negative result does not preclude  SARS-Cov-2 infection and should not be used as the sole basis for treatment or other patient management decisions. A negative result may occur with  improper specimen collection/handling, submission of specimen other than nasopharyngeal swab, presence of viral mutation(s) within the areas targeted by this assay, and inadequate number of viral copies (<131 copies/mL). A negative result must be combined with clinical  observations, patient history, and epidemiological information. The expected result is Negative.  Fact Sheet for Patients:  PinkCheek.be  Fact Sheet for Healthcare Providers:  GravelBags.it  This test is no t yet approved or cleared by the [place] FDA and  has been authorized for detection and/or diagnosis of SARS-CoV-2 by FDA under an Emergency Use Authorization (EUA). This EUA will remain  in effect (meaning this test can be used) for the duration of the COVID-19 declaration under Section 564(b)(1) of the Act, 21 U.S.C. section 360bbb-3(b)(1), unless the authorization is terminated or revoked sooner.     Influenza A by PCR NEGATIVE NEGATIVE Final   Influenza B by PCR NEGATIVE NEGATIVE Final    Comment: (NOTE) The Xpert Xpress SARS-CoV-2/FLU/RSV assay is intended as an aid in  the diagnosis of influenza from Nasopharyngeal swab specimens and  should not be used as a sole basis for treatment. Nasal washings and  aspirates are unacceptable for Xpert Xpress SARS-CoV-2/FLU/RSV  testing.  Fact Sheet for Patients: PinkCheek.be  Fact Sheet for Healthcare Providers: GravelBags.it  This test is not yet approved or cleared by the [place] FDA and  has been authorized for detection and/or diagnosis of SARS-CoV-2 by  FDA under an Emergency Use Authorization (EUA). This EUA will remain  in effect (meaning this test can be used) for the duration of the   Covid-19 declaration under Section 564(b)(1) of the Act, 21  U.S.C. section 360bbb-3(b)(1), unless the authorization is  terminated or revoked. Performed at [facility], [address] [place], [place] [address]   Urine Culture     Status: None   Collection Time: 12/15/19  9:05 AM   Specimen: Urine, Random  Result Value Ref Range Status   Specimen Description   Final    URINE, RANDOM Performed at [facility], [address], [place], [place] [address]    Special Requests   Final    NONE Performed at [facility], [address], [place], [place] [address]    Culture   Final    NO GROWTH Performed at [facility], [place] [address] [place], [place] [address]    Report Status 12/16/2019 FINAL  Final    Radiology Studies: DG Knee 1-2 Views Left  Result Date: 12/16/2019 CLINICAL DATA:  69 year old who fell 2 days ago and injured the LEFT knee. Subsequent encounter. EXAM: LEFT KNEE - 1-2 VIEW COMPARISON:  None. FINDINGS: Subtle nondisplaced fracture involving the tibial spine. No fractures elsewhere. Small joint effusion/hemarthrosis. IMPRESSION: Subtle nondisplaced fracture involving the tibial spine. Electronically Signed   By: Evangeline Dakin M.D.   On: 12/16/2019 17:01      Kordell Jafri T. [place]  If 7PM-7AM, please contact night-coverage [email or web address] 12/17/2019, 12:40 PM

## 2019-12-17 NOTE — TOC Progression Note (Signed)
Transition of Care [facility]) - Progression Note    Patient Details  Name: Stacy Grimes MRN: [id number] Date of Birth: 1950-08-11  Transition of Care [facility]) CM/SW [place], RN Phone Number: 12/17/2019, 2:34 PM  Clinical Narrative:   Patient and husband decided after PT that SNF will be appropriate care at this time. PASSR/FL2 and Bed search completed.    Expected Discharge Plan: [place] Barriers to Discharge: Continued Medical Work up  Expected Discharge Plan and Services Expected Discharge Plan: [place] In-house Referral: Clinical Social Work   Post Acute Care Choice: [place] arrangements for the past 2 months: [place] Expected Discharge Date: 12/17/19                         HH Arranged: RN, PT, OT, Nurse's Aide [place] Agency: [place] ([place]) Date [place]: 12/16/19 Time [place]: 1544 Representative spoke with at [place]: [place] (SDOH) Interventions    Readmission Risk Interventions No flowsheet data found.
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
## 2019-12-17 NOTE — Progress Notes (Signed)
Report called to Tiffany, RN on 1C Time allowed for questions and concerns.  Verbalizes an understanding on plan of care.  Pt and spouse updated on plan of care Denies any additional wants or needs at this time

## 2019-12-17 NOTE — Progress Notes (Signed)
Procedure note Right knee injection given after informed consent obtained.  3 cc half percent Marcaine injected into the right inferior lateral portal along with 40 mg Kenalog.  Tolerated well with Band-Aid applied

## 2019-12-17 NOTE — Consult Note (Addendum)
Reason for Consult: Left knee pain swelling and tibial spine fracture Referring Physician: Dr. Karlyn Stacy Grimes is an 69 y.o. female.  HPI: Patient reports of a fall injuring her left knee several days ago landing she thinks that directly anterior knee.  She has had swelling and pain and difficulty with weightbearing.  X-rays were ordered and show tibial spine avulsion injury.  No other process no plateau fracture is visible.  She reports difficulty moving the knee as well and pain with motion.  Past Medical History:  Diagnosis Date  . AAA (abdominal aortic aneurysm) ([place]acy Grimes)   . Anginal pain ([place]acy Grimes)   . Anxiety   . Arthritis   . Automatic implantable cardioverter-defibrillator in situ   . Biventricular ICD  [employer]   . COPD (chronic obstructive pulmonary disease) (Stacy Grimes)   . Deafness    left ear  . Dementia ([place]acy Grimes)   . Depression   . Dizziness   . Dysrhythmia   . Fracture    history of spinal fracture  . GERD (gastroesophageal reflux disease)   . HFrEF (heart failure with reduced ejection fraction) ([place]acy Grimes)    a. 2010 Cath: nonobs dzs, EF 15-20%; b. 09/2017 Echo: EF suboptimal. EF low nl; c. 04/2018 Echo: EF 40-45%.  . Hypertension   . Hypothyroidism   . Non-ischemic cardiomyopathy ([place] Grimes)    a. 2010 Cath: nonobs dzs, EF 15-20%; b. 2011 s/p SJM CRT-D; c. 09/2016 s/p Gen change; d. 09/2017 Echo: EF suboptimal. EF low nl; e. 04/2018 Echo: EF 40-45%.  . Oxygen dependent   . Palpitations   . Shortness of breath dyspnea   . Sleep apnea   . Wheezing     Past Surgical History:  Procedure Laterality Date  . ABDOMINAL HYSTERECTOMY    . BIV ICD GENERATOR CHANGEOUT N/A 09/17/2016   Procedure: BiV ICD Generator Changeout;  Surgeon: Deboraha Sprang, MD;  Location: [facility];  Service: Cardiovascular;  Laterality: N/A;  . BLADDER SURGERY    . CARDIAC CATHETERIZATION    . CARDIAC DEFIBRILLATOR PLACEMENT  4 yrs ago   pacemaker  . CATARACT EXTRACTION W/PHACO Left 09/01/2014   Procedure:  CATARACT EXTRACTION PHACO AND INTRAOCULAR LENS PLACEMENT (IOC);  Surgeon: Lyla Glassing, MD;  Location: [facility];  Service: Ophthalmology;  Laterality: Left;  [place]: 1:12.1   . CATARACT EXTRACTION W/PHACO Right 09/29/2014   Procedure: CATARACT EXTRACTION PHACO AND INTRAOCULAR LENS PLACEMENT (IOC);  Surgeon: Lyla Glassing, MD;  Location: [facility];  Service: Ophthalmology;  Laterality: Right;  [place]: 00:52.7   . CHOLECYSTECTOMY    . COLON SURGERY    . COLONOSCOPY    . COLONOSCOPY N/A 08/31/2012   Procedure: COLONOSCOPY;  Surgeon: Inda Castle, MD;  Location: [facility];  Service: Endoscopy;  Laterality: N/A;  . ESOPHAGOGASTRODUODENOSCOPY N/A 07/06/2012   Procedure: ESOPHAGOGASTRODUODENOSCOPY (EGD);  Surgeon: Lafayette Dragon, MD;  Location: [facility];  Service: Endoscopy;  Laterality: N/A;  . KNEE SURGERY Right   . MIDDLE EAR SURGERY    . SAVORY DILATION N/A 07/06/2012   Procedure: SAVORY DILATION;  Surgeon: Lafayette Dragon, MD;  Location: [facility];  Service: Endoscopy;  Laterality: N/A;  . WRIST SURGERY Right     Family History  Problem Relation Age of Onset  . Hypertension Mother   . Breast cancer Mother   . Stroke Mother   . Heart disease Father   . Heart attack Sister   . Hypertension Brother   . Heart attack Sister   .  Colon cancer Brother 76    Social History:  reports that she quit smoking about 17 years ago. Her smoking use included cigarettes. She has a 60.00 pack-year smoking history. She has never used smokeless tobacco. She reports that she does not drink alcohol and does not use drugs.  Allergies:  Allergies  Allergen Reactions  . Codeine Palpitations  . Contrast Media [Iodinated Diagnostic Agents] Rash    Medications: I have reviewed the patient's current medications.  Results for orders placed or performed during the hospital encounter of 12/15/19 (from the past 48 hour(s))  CBC     Status: Abnormal   Collection Time: 12/15/19  7:22 AM  Result Value Ref Range    WBC 11.4 (H) 4.0 - 10.5 K/uL   RBC 4.09 3.87 - 5.11 MIL/uL   Hemoglobin 10.7 (L) 12.0 - 15.0 g/dL   HCT 36.4 36 - 46 %   MCV 89.0 80.0 - 100.0 fL   MCH 26.2 26.0 - 34.0 pg   MCHC 29.4 (L) 30.0 - 36.0 g/dL   RDW 16.3 (H) 11.5 - 15.5 %   Platelets 366 150 - 400 K/uL   nRBC 0.0 0.0 - 0.2 %    Comment: Performed at [facility], [address] [place], [place] [address]  Basic metabolic panel     Status: Abnormal   Collection Time: 12/15/19  7:22 AM  Result Value Ref Range   Sodium 139 135 - 145 mmol/L   Potassium 4.0 3.5 - 5.1 mmol/L    Comment: HEMOLYSIS AT THIS LEVEL MAY AFFECT RESULT   Chloride 97 (L) 98 - 111 mmol/L   CO2 30 22 - 32 mmol/L   Glucose, Bld 169 (H) 70 - 99 mg/dL    Comment: Glucose reference range applies only to samples taken after fasting for at least 8 hours.   BUN 17 8 - 23 mg/dL   Creatinine, Ser 1.12 (H) 0.44 - 1.00 mg/dL   Calcium 9.0 8.9 - 10.3 mg/dL   GFR, Estimated 53 (L) >60 mL/min    Comment: (NOTE) Calculated using the CKD-EPI Creatinine Equation (2021)    Anion gap 12 5 - 15    Comment: Performed at [facility], [address] [place], [place] [address]  Brain natriuretic peptide     Status: None   Collection Time: 12/15/19  7:22 AM  Result Value Ref Range   B Natriuretic Peptide 82.4 0.0 - 100.0 pg/mL    Comment: Performed at [facility], [address] [place], [place] [address]  Troponin I (High Sensitivity)     Status: Abnormal   Collection Time: 12/15/19  7:22 AM  Result Value Ref Range   Troponin I (High Sensitivity) 25 (H) <18 ng/L    Comment: (NOTE) Elevated high sensitivity troponin I (hsTnI) values and significant  changes across serial measurements may suggest ACS but many other  chronic and acute conditions are known to elevate hsTnI results.  Refer to the "Links" section for chest pain algorithms and additional  guidance. Performed at [facility], [address]  [place], [place] [address]   Respiratory Panel by RT PCR (Flu A&B, Covid) - Nasopharyngeal Swab     Status: None   Collection Time: 12/15/19  7:22 AM   Specimen: Nasopharyngeal Swab  Result Value Ref Range   SARS Coronavirus 2 by RT PCR NEGATIVE NEGATIVE    Comment: (NOTE) SARS-CoV-2 target nucleic acids are NOT DETECTED.  The SARS-CoV-2 RNA is generally detectable in  upper respiratoy specimens during the acute phase of infection. The lowest concentration of SARS-CoV-2 viral copies this assay can detect is 131 copies/mL. A negative result does not preclude SARS-Cov-2 infection and should not be used as the sole basis for treatment or other patient management decisions. A negative result may occur with  improper specimen collection/handling, submission of specimen other than nasopharyngeal swab, presence of viral mutation(s) within the areas targeted by this assay, and inadequate number of viral copies (<131 copies/mL). A negative result must be combined with clinical observations, patient history, and epidemiological information. The expected result is Negative.  Fact Sheet for Patients:  PinkCheek.be  Fact Sheet for Healthcare Providers:  GravelBags.it  This test is no t yet approved or cleared by the [place] FDA and  has been authorized for detection and/or diagnosis of SARS-CoV-2 by FDA under an Emergency Use Authorization (EUA). This EUA will remain  in effect (meaning this test can be used) for the duration of the COVID-19 declaration under Section 564(b)(1) of the Act, 21 U.S.C. section 360bbb-3(b)(1), unless the authorization is terminated or revoked sooner.     Influenza A by PCR NEGATIVE NEGATIVE   Influenza B by PCR NEGATIVE NEGATIVE    Comment: (NOTE) The Xpert Xpress SARS-CoV-2/FLU/RSV assay is intended as an aid in  the diagnosis of influenza from Nasopharyngeal swab specimens and  should not be used as  a sole basis for treatment. Nasal washings and  aspirates are unacceptable for Xpert Xpress SARS-CoV-2/FLU/RSV  testing.  Fact Sheet for Patients: PinkCheek.be  Fact Sheet for Healthcare Providers: GravelBags.it  This test is not yet approved or cleared by the [place] FDA and  has been authorized for detection and/or diagnosis of SARS-CoV-2 by  FDA under an Emergency Use Authorization (EUA). This EUA will remain  in effect (meaning this test can be used) for the duration of the  Covid-19 declaration under Section 564(b)(1) of the Act, 21  U.S.C. section 360bbb-3(b)(1), unless the authorization is  terminated or revoked. Performed at [facility], [address] [place], [place] [address]   Urinalysis, Complete w Microscopic Urine, Catheterized     Status: Abnormal   Collection Time: 12/15/19  9:05 AM  Result Value Ref Range   Color, Urine YELLOW (A) YELLOW   APPearance CLOUDY (A) CLEAR   Specific Gravity, Urine 1.023 1.005 - 1.030   pH 5.0 5.0 - 8.0   Glucose, UA 50 (A) NEGATIVE mg/dL   Hgb urine dipstick NEGATIVE NEGATIVE   Bilirubin Urine NEGATIVE NEGATIVE   Ketones, ur NEGATIVE NEGATIVE mg/dL   Protein, ur 100 (A) NEGATIVE mg/dL   Nitrite NEGATIVE NEGATIVE   Leukocytes,Ua NEGATIVE NEGATIVE   RBC / HPF 0-5 0 - 5 RBC/hpf   WBC, UA 6-10 0 - 5 WBC/hpf   Bacteria, UA RARE (A) NONE SEEN   Squamous Epithelial / LPF 0-5 0 - 5   Granular Casts, UA PRESENT     Comment: Performed at [facility], [address] [place], [place] [address]  Urine Culture     Status: None   Collection Time: 12/15/19  9:05 AM   Specimen: Urine, Random  Result Value Ref Range   Specimen Description      URINE, RANDOM Performed at [facility], [address] [place], [place] [address]    Special Requests      NONE Performed at [facility], [address], [place], [place] [address]     Culture  NO GROWTH Performed at [facility], [place] [address], [place], [place] [address]    Report Status 12/16/2019 FINAL   Troponin I (High Sensitivity)     Status: Abnormal   Collection Time: 12/15/19  1:12 PM  Result Value Ref Range   Troponin I (High Sensitivity) 78 (H) <18 ng/L    Comment: READ BACK AND VERIFIED WITH JANE MARTIN AT 1402 ON 12/15/2019 [facility]. (NOTE) Elevated high sensitivity troponin I (hsTnI) values and significant  changes across serial measurements may suggest ACS but many other  chronic and acute conditions are known to elevate hsTnI results.  Refer to the "Links" section for chest pain algorithms and additional  guidance. Performed at [facility], [address] [place], [place] [address]   Troponin I (High Sensitivity)     Status: Abnormal   Collection Time: 12/15/19  3:36 PM  Result Value Ref Range   Troponin I (High Sensitivity) 84 (H) <18 ng/L    Comment: (NOTE) Elevated high sensitivity troponin I (hsTnI) values and significant  changes across serial measurements may suggest ACS but many other  chronic and acute conditions are known to elevate hsTnI results.  Refer to the "Links" section for chest pain algorithms and additional  guidance. Performed at [facility], [address] [place], [place] [address]   Hemoglobin A1c     Status: Abnormal   Collection Time: 12/15/19  5:04 PM  Result Value Ref Range   Hgb A1c MFr Bld 7.3 (H) 4.8 - 5.6 %    Comment: (NOTE) Pre diabetes:          5.7%-6.4%  Diabetes:              >6.4%  Glycemic control for   <7.0% adults with diabetes    Mean Plasma Glucose 162.81 mg/dL    Comment: Performed at [facility] [address], [place], [place] [address]  CBG monitoring, ED     Status: Abnormal   Collection Time: 12/15/19  5:43 PM  Result Value Ref Range   Glucose-Capillary 162 (H) 70 - 99 mg/dL    Comment: Glucose reference range applies only to samples taken after  fasting for at least 8 hours.  Troponin I (High Sensitivity)     Status: Abnormal   Collection Time: 12/15/19  5:46 PM  Result Value Ref Range   Troponin I (High Sensitivity) 73 (H) <18 ng/L    Comment: (NOTE) Elevated high sensitivity troponin I (hsTnI) values and significant  changes across serial measurements may suggest ACS but many other  chronic and acute conditions are known to elevate hsTnI results.  Refer to the "Links" section for chest pain algorithms and additional  guidance. Performed at [facility], [address] [place],  [address]   Uric acid     Status: None   Collection Time: 12/15/19  5:46 PM  Result Value Ref Range   Uric Acid, Serum 5.1 2.5 - 7.1 mg/dL    Comment: Performed at [facility], [address] [place],  [address]  CBG monitoring, ED     Status: Abnormal   Collection Time: 12/15/19  6:46 PM  Result Value Ref Range   Glucose-Capillary 155 (H) 70 - 99 mg/dL    Comment: Glucose reference range applies only to samples taken after fasting for at least 8 hours.  Glucose, capillary     Status: Abnormal   Collection Time: 12/15/19  8:45 PM  Result Value Ref  Range   Glucose-Capillary 100 (H) 70 - 99 mg/dL    Comment: Glucose reference range applies only to samples taken after fasting for at least 8 hours.  Glucose, capillary     Status: Abnormal   Collection Time: 12/16/19  8:49 AM  Result Value Ref Range   Glucose-Capillary 120 (H) 70 - 99 mg/dL    Comment: Glucose reference range applies only to samples taken after fasting for at least 8 hours.  Glucose, capillary     Status: Abnormal   Collection Time: 12/16/19 11:29 AM  Result Value Ref Range   Glucose-Capillary 129 (H) 70 - 99 mg/dL    Comment: Glucose reference range applies only to samples taken after fasting for at least 8 hours.  Glucose, capillary     Status: Abnormal   Collection Time: 12/16/19  4:48 PM  Result Value Ref Range   Glucose-Capillary 115 (H)  70 - 99 mg/dL    Comment: Glucose reference range applies only to samples taken after fasting for at least 8 hours.  Glucose, capillary     Status: Abnormal   Collection Time: 12/16/19  8:21 PM  Result Value Ref Range   Glucose-Capillary 146 (H) 70 - 99 mg/dL    Comment: Glucose reference range applies only to samples taken after fasting for at least 8 hours.    DG Chest 1 View  Result Date: 12/15/2019 CLINICAL DATA:  Altered mental status. EXAM: CHEST  1 VIEW COMPARISON:  April 09, 2018. FINDINGS: Stable cardiomediastinal silhouette. Stable left-sided pacemaker. Both lungs are clear. The visualized skeletal structures are unremarkable. IMPRESSION: No active disease. Electronically Signed   By: Marijo Conception M.D.   On: 12/15/2019 08:49   DG Knee 1-2 Views Left  Result Date: 12/16/2019 CLINICAL DATA:  69 year old who fell 2 days ago and injured the LEFT knee. Subsequent encounter. EXAM: LEFT KNEE - 1-2 VIEW COMPARISON:  None. FINDINGS: Subtle nondisplaced fracture involving the tibial spine. No fractures elsewhere. Small joint effusion/hemarthrosis. IMPRESSION: Subtle nondisplaced fracture involving the tibial spine. Electronically Signed   By: Evangeline Dakin M.D.   On: 12/16/2019 17:01   DG Tibia/Fibula Left  Result Date: 12/15/2019 CLINICAL DATA:  Pain proximal fibular region. EXAM: LEFT TIBIA AND FIBULA - 2 VIEW COMPARISON:  Concurrent femur radiographs. FINDINGS: Normal alignment with approximation of the joints. No fracture or focal osseous lesion. Mild knee degenerative changes. Soft tissues are unremarkable. IMPRESSION: No acute osseous abnormality.  Mild knee osteoarthrosis. Electronically Signed   By: Primitivo Gauze M.D.   On: 12/15/2019 10:49   CT Head Wo Contrast  Result Date: 12/15/2019 CLINICAL DATA:  Altered mental status EXAM: CT HEAD WITHOUT CONTRAST TECHNIQUE: Contiguous axial images were obtained from the base of the skull through the vertex without intravenous  contrast. COMPARISON:  04/09/2018 FINDINGS: Brain: No evidence of acute infarction, hemorrhage, hydrocephalus, extra-axial collection or mass lesion/mass effect. Mild low-density changes within the periventricular and subcortical white matter compatible with chronic microvascular ischemic change. Mild diffuse cerebral volume loss. Vascular: Atherosclerotic calcifications involving the large vessels of the skull base. No unexpected hyperdense vessel. Skull: Normal. Negative for fracture or focal lesion. Sinuses/Orbits: No acute finding. Other: None. IMPRESSION: 1. No acute intracranial findings. 2. Mild chronic microvascular ischemic change and cerebral volume loss. Electronically Signed   By: Davina Poke D.O.   On: 12/15/2019 08:08   CT Lumbar Spine Wo Contrast  Result Date: 12/15/2019 CLINICAL DATA:  Altered mental status.  Back pain. EXAM: CT LUMBAR SPINE  WITHOUT CONTRAST TECHNIQUE: Multidetector CT imaging of the lumbar spine was performed without intravenous contrast administration. Multiplanar CT image reconstructions were also generated. COMPARISON:  Radiography 06/14/2014. FINDINGS: Segmentation: 5 lumbar type vertebral bodies. Alignment: Minimal thoracolumbar curvature convex to the left and lower lumbar curvature convex to the right. 1 mm degenerative anterolisthesis L4-5. Vertebrae: Old compression deformity at L1 with loss of height of 1/3. No change since 2016. No other regional fracture. Paraspinal and other soft tissues: Aortic atherosclerosis. Otherwise negative. Disc levels: Chronic degenerative spondylosis at L2-3. No compressive canal or foraminal stenosis. L3-4: Disc degeneration and minimal bulge but no stenosis. L4-5: Mild bulging of the disc. Bilateral facet osteoarthritis allowing 1 mm of anterolisthesis. Mild stenosis of both lateral recesses and foramina. L5-S1: Mild bulging of the disc. Mild facet osteoarthritis. No apparent compressive stenosis. IMPRESSION: 1. No acute finding.  Old compression deformity at L1 with loss of height of 1/3. 2. Lower lumbar degenerative disc disease and degenerative facet disease. Mild stenosis of the lateral recesses and foramina at L4-5 that could cause neural compression. Aortic Atherosclerosis (ICD10-I70.0). Electronically Signed   By: Nelson Chimes M.D.   On: 12/15/2019 13:00   CT PELVIS WO CONTRAST  Result Date: 12/15/2019 CLINICAL DATA:  Limping.  Mental status changes.  Left hip pain. EXAM: CT PELVIS WITHOUT CONTRAST TECHNIQUE: Multidetector CT imaging of the pelvis was performed following the standard protocol without intravenous contrast. COMPARISON:  None. FINDINGS: Urinary Tract:  Bladder appears normal. Bowel: Large amount of fecal matter in the cecum. Diverticulosis of the left colon. No sign of diverticulitis. Previous rectosigmoid anastomosis. Vascular/Lymphatic: Aortic atherosclerosis. No aneurysm. No adenopathy. Reproductive: Previous hysterectomy. No pelvic mass. Small cysts of the right ovary, none larger than 1.6 cm. Other:  No free fluid or air. Musculoskeletal: No evidence of regional fracture or focal bone pathology. No evidence of joint effusion in either hip joint. No evidence of regional muscular injury. IMPRESSION: 1. No acute finding by CT. No evidence of regional fracture or focal bone pathology. No evidence of joint effusion in either hip joint. No evidence of regional muscular injury. 2. Large amount of fecal matter in the cecum. Diverticulosis of the left colon without evidence of diverticulitis. Previous rectosigmoid anastomosis. 3. Aortic atherosclerosis. Aortic Atherosclerosis (ICD10-I70.0). Electronically Signed   By: Nelson Chimes M.D.   On: 12/15/2019 13:04   ECHOCARDIOGRAM COMPLETE  Result Date: 12/16/2019    ECHOCARDIOGRAM REPORT   Patient Name:   [place] [facility] Date of Exam: 12/16/2019 Medical Rec #:  [id number]        Height:       63.0 in Accession #:    [id number]       Weight:       201.3 lb Date of Birth:   December 07, 1950         BSA:          1.939 m Patient Age:    62 years         BP:           112/58 mmHg Patient Gender: F                HR:           97 bpm. Exam Location:  [facility] Procedure: 2D Echo, Color Doppler, Cardiac Doppler and Intracardiac            Opacification Agent Indications:     Elevated troponin  History:         Patient has  prior history of Echocardiogram examinations.                  Cardiomyopathy and HFpEF, Defibrillator, COPD; Risk                  Factors:Sleep Apnea and Hypertension.  Sonographer:     Charmayne Sheer RDCS (AE) Referring Phys:  [id number] Val Riles Diagnosing Phys: Ida Rogue MD  Sonographer Comments: Technically difficult study due to poor echo windows. Image acquisition challenging due to patient body habitus and Image acquisition challenging due to COPD. IMPRESSIONS  1. Left ventricular ejection fraction, by estimation, is 35 to 40%. The left ventricle has moderately decreased function. The left ventricle demonstrates global hypokinesis. Indeterminate diastolic filling due to E-A fusion.  2. Right ventricular systolic function is normal. The right ventricular size is normal. Tricuspid regurgitation signal is inadequate for assessing PA pressure.  3. The mitral valve is normal in structure. No evidence of mitral valve regurgitation. No evidence of mitral stenosis. FINDINGS  Left Ventricle: Left ventricular ejection fraction, by estimation, is 35 to 40%. The left ventricle has moderately decreased function. The left ventricle demonstrates global hypokinesis. Definity contrast agent was given IV to delineate the left ventricular endocardial borders. The left ventricular internal cavity size was normal in size. There is no left ventricular hypertrophy. Indeterminate diastolic filling due to E-A fusion. Right Ventricle: The right ventricular size is normal. No increase in right ventricular wall thickness. Right ventricular systolic function is normal. Tricuspid regurgitation signal  is inadequate for assessing PA pressure. Left Atrium: Left atrial size was normal in size. Right Atrium: Right atrial size was normal in size. Pericardium: There is no evidence of pericardial effusion. Mitral Valve: The mitral valve is normal in structure. No evidence of mitral valve regurgitation. No evidence of mitral valve stenosis. MV peak gradient, 7.0 mmHg. The mean mitral valve gradient is 4.0 mmHg. Tricuspid Valve: The tricuspid valve is normal in structure. Tricuspid valve regurgitation is not demonstrated. No evidence of tricuspid stenosis. Aortic Valve: The aortic valve is normal in structure. Aortic valve regurgitation is not visualized. No aortic stenosis is present. Aortic valve mean gradient measures 4.0 mmHg. Aortic valve peak gradient measures 7.6 mmHg. Aortic valve area, by VTI measures 2.70 cm. Pulmonic Valve: The pulmonic valve was normal in structure. Pulmonic valve regurgitation is not visualized. No evidence of pulmonic stenosis. Aorta: The aortic root is normal in size and structure. Venous: The inferior vena cava is normal in size with greater than 50% respiratory variability, suggesting right atrial pressure of 3 mmHg. IAS/Shunts: No atrial level shunt detected by color flow Doppler. Additional Comments: A pacer wire is visualized.  LEFT VENTRICLE PLAX 2D LVIDd:         5.38 cm  Diastology LVIDs:         4.29 cm  LV e' lateral:   3.05 cm/s LV PW:         1.03 cm  LV E/e' lateral: 37.7 LV IVS:        0.65 cm LVOT diam:     2.10 cm LV SV:         51 LV SV Index:   26 LVOT Area:     3.46 cm  LEFT ATRIUM           Index LA diam:      3.10 cm 1.60 cm/m LA Vol (A4C): 35.0 ml 18.05 ml/m  AORTIC VALVE  PULMONIC VALVE AV Area (Vmax):    2.45 cm    PV Vmax:       0.90 m/s AV Area (Vmean):   2.06 cm    PV Vmean:      69.200 cm/s AV Area (VTI):     2.70 cm    PV VTI:        0.172 m AV Vmax:           138.00 cm/s PV Peak grad:  3.2 mmHg AV Vmean:          92.300 cm/s PV Mean  grad:  2.0 mmHg AV VTI:            0.187 m AV Peak Grad:      7.6 mmHg AV Mean Grad:      4.0 mmHg LVOT Vmax:         97.50 cm/s LVOT Vmean:        54.800 cm/s LVOT VTI:          0.146 m LVOT/AV VTI ratio: 0.78  AORTA Ao Root diam: 3.00 cm MITRAL VALVE MV Area (PHT): 7.27 cm     SHUNTS MV Peak grad:  7.0 mmHg     Systemic VTI:  0.15 m MV Mean grad:  4.0 mmHg     Systemic Diam: 2.10 cm MV Vmax:       1.32 m/s MV Vmean:      86.8 cm/s MV Decel Time: 104 msec MV E velocity: 115.00 cm/s Ida Rogue MD Electronically signed by Ida Rogue MD Signature Date/Time: 12/16/2019/5:10:16 PM    Final    DG Femur Min 2 Views Left  Result Date: 12/15/2019 CLINICAL DATA:  Left hip pain. EXAM: LEFT FEMUR 2 VIEWS COMPARISON:  None. FINDINGS: There is no evidence of fracture or other focal bone lesions. Soft tissues are unremarkable. IMPRESSION: Negative. Electronically Signed   By: Marijo Conception M.D.   On: 12/15/2019 08:49    Review of Systems Blood pressure (!) 85/77, pulse 94, temperature 98.1 F (36.7 C), temperature source Oral, resp. rate 18, height 5\' 3"  (1.6 m), weight 92 kg, SpO2 91 %. Physical Exam She can actively hold the leg up in extension against gravity.  Skin is intact around the anterior knee she is tender over the patella.  She has diffuse tenderness but no specific joint line tenderness. No instability to varus valgus posterior drawer [place].  Range of motion is 5 to 45 degrees actively and then limited by pain.  Passive motion was not performed secondary to her pain level.  There is an effusion present. Assessment/Plan: Tibial spine avulsion consistent with acute knee injury.  This is likely nonoperative and I recommended knee injection which she refuses.  I think she is fine to go weightbearing as tolerated for physical therapy and if she is not better in a week I want to have her follow-up with me outpatient for MRI for possible insufficiency fracture.  Hessie Knows 12/17/2019, 7:17  AM    Patient was seen with husband present.  He has previously had arthroscopic surgery Dr. Marina Gravel is wondering if that was what she needed.  Unfortunately she has a pacemaker so MRI would not be the best idea for her.  I told him that I wanted to give her an injection this morning she refused but he is trying to convince her this afternoon.  I think that we best for her.  This does not give some resolution I would get a bone  scan to make sure there is noninsufficiency fracture in the proximal tibia.

## 2019-12-17 NOTE — NC FL2 (Signed)
[place] LEVEL OF CARE SCREENING TOOL     IDENTIFICATION  Patient Name: Stacy Grimes Birthdate: [birth year] Sex: female Admission Date (Current Location): 12/15/2019  [place] and [place] Number:  Engineering geologist and Address:  [facility], [address], [place], [place] [address]      Provider Number: [id number]  Attending Physician Name and Address:  Mercy Riding, MD  Relative Name and Phone Number:  Natalyia Innes, Husband [phone]    Current Level of Care: Hospital Recommended Level of Care: [place] Prior Approval Number:    Date Approved/Denied:   PASRR Number: [id number]  Discharge Plan: SNF    Current Diagnoses: Patient Active Problem List   Diagnosis Date Noted  . Left knee pain 12/16/2019  . Elevated troponin I level 12/15/2019  . Dehydration 04/12/2018  . Syncope 04/09/2018  . 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 Diverticulosis of colon (without mention of hemorrhage) 08/31/2012  . Fatigue 08/10/2012  . Personal history of colonic polyps 07/02/2012  . Dysphagia, unspecified(787.20) 07/02/2012  . Unspecified constipation 07/02/2012  . 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 HYPERTENSION, BENIGN 03/24/2009  . SLEEP APNEA, OBSTRUCTIVE 03/23/2009  . Dilated cardiomyopathy ([place]) 03/23/2009  . PALPITATIONS 03/23/2009  . MURMUR 03/23/2009  . COUGH 03/23/2009    Orientation RESPIRATION BLADDER Height & Weight     Self, Situation  O2 (3L [place]) Incontinent Weight: 92 kg Height:  5\' 3"  (160 cm)  BEHAVIORAL SYMPTOMS/MOOD NEUROLOGICAL BOWEL NUTRITION STATUS      Continent Diet (Regular)  AMBULATORY STATUS COMMUNICATION OF NEEDS Skin   Limited Assist Verbally                         Personal Care Assistance Level of Assistance  Bathing, Dressing Bathing Assistance: Limited assistance   Dressing Assistance: Limited assistance     Functional  Limitations Info             SPECIAL CARE FACTORS FREQUENCY  PT (By licensed PT), OT (By licensed OT)     PT Frequency: 5X WEEK OT Frequency: 5X WEEK            Contractures Contractures Info: Not present    Additional Factors Info  Code Status, Allergies Code Status Info: Full Allergies Info: Codeine, Contrast Media           Current Medications (12/17/2019):  This is the current hospital active medication list Current Facility-Administered Medications  Medication Dose Route Frequency Provider Last Rate Last Admin  . acetaminophen (TYLENOL) tablet 650 mg  650 mg Oral Q4H PRN Val Riles, MD   650 mg at 12/16/19 2355  . albuterol (VENTOLIN HFA) 108 (90 Base) MCG/ACT inhaler 2 puff  2 puff Inhalation Q6H PRN Val Riles, MD      . allopurinol (ZYLOPRIM) tablet 300 mg  300 mg Oral Daily Val Riles, MD   300 mg at 12/17/19 0906  . aspirin EC tablet 81 mg  81 mg Oral Daily Val Riles, MD   81 mg at 12/17/19 0905  . bisacodyl (DULCOLAX) EC tablet 10 mg  10 mg Oral QHS Val Riles, MD   10 mg at 12/15/19 2224  . digoxin (LANOXIN) tablet 0.125 mg  0.125 mg Oral Melvenia Needles, MD   0.125 mg at 12/16/19 0935  . donepezil (ARICEPT) tablet 5 mg  5 mg Oral QHS Val Riles, MD   5 mg at 12/15/19 2302  .  enoxaparin (LOVENOX) injection 45 mg  45 mg Subcutaneous Q24H Rowland Lathe, RPH   45 mg at 12/15/19 2302  . fluticasone furoate-vilanterol (BREO ELLIPTA) 200-25 MCG/INH 1 puff  1 puff Inhalation Daily Val Riles, MD   1 puff at 12/17/19 0907  . furosemide (LASIX) tablet 20 mg  20 mg Oral Daily PRN Val Riles, MD      . gabapentin (NEURONTIN) capsule 300 mg  300 mg Oral BID Val Riles, MD   300 mg at 12/17/19 0905  . hydrOXYzine (ATARAX/VISTARIL) tablet 25 mg  25 mg Oral QHS Val Riles, MD   25 mg at 12/16/19 2355  . insulin aspart (novoLOG) injection 0-15 Units  0-15 Units Subcutaneous TID WC Val Riles, MD   3 Units at 12/17/19 1125  .  levothyroxine (SYNTHROID) tablet 112 mcg  112 mcg Oral QAC breakfast Val Riles, MD   112 mcg at 12/17/19 0656  . melatonin tablet 10 mg  10 mg Oral QHS PRN Val Riles, MD   10 mg at 12/15/19 2223  . metoprolol succinate (TOPROL-XL) 24 hr tablet 25 mg  25 mg Oral Daily Val Riles, MD   25 mg at 12/17/19 0906  . ondansetron (ZOFRAN) injection 4 mg  4 mg Intravenous Q6H PRN Val Riles, MD      . oxyCODONE (Oxy IR/ROXICODONE) immediate release tablet 5 mg  5 mg Oral Q6H PRN Mercy Riding, MD   5 mg at 12/17/19 0906  . pantoprazole (PROTONIX) EC tablet 40 mg  40 mg Oral Daily Val Riles, MD   40 mg at 12/17/19 0906  . polyethylene glycol (MIRALAX / GLYCOLAX) packet 17 g  17 g Oral Daily Val Riles, MD   17 g at 12/17/19 0907  . tiotropium (SPIRIVA) inhalation capsule ([facility] use ONLY) 18 mcg  18 mcg Inhalation Daily PRN Val Riles, MD      . venlafaxine [facility]) tablet 75 mg  75 mg Oral BID Val Riles, MD   75 mg at 12/17/19 0907  . vitamin B-12 (CYANOCOBALAMIN) tablet 1,000 mcg  1,000 mcg Oral BID Val Riles, MD   1,000 mcg at 12/17/19 4665     Discharge Medications: Please see discharge summary for a list of discharge medications.  Relevant Imaging Results:  Relevant Lab Results:   Additional Information    Kerin Salen, RN

## 2019-12-18 DIAGNOSIS — F0391 Unspecified dementia with behavioral disturbance: Secondary | ICD-10-CM | POA: Diagnosis not present

## 2019-12-18 DIAGNOSIS — S82115B Nondisplaced fracture of left tibial spine, initial encounter for open fracture type I or II: Secondary | ICD-10-CM | POA: Diagnosis not present

## 2019-12-18 DIAGNOSIS — R778 Other specified abnormalities of plasma proteins: Secondary | ICD-10-CM | POA: Diagnosis not present

## 2019-12-18 DIAGNOSIS — M25562 Pain in left knee: Secondary | ICD-10-CM | POA: Diagnosis not present

## 2019-12-18 LAB — CBC
HCT: 32.2 % — ABNORMAL LOW (ref 36.0–46.0)
Hemoglobin: 9.6 g/dL — ABNORMAL LOW (ref 12.0–15.0)
MCH: 26.2 pg (ref 26.0–34.0)
MCHC: 29.8 g/dL — ABNORMAL LOW (ref 30.0–36.0)
MCV: 88 fL (ref 80.0–100.0)
Platelets: 304 10*3/uL (ref 150–400)
RBC: 3.66 MIL/uL — ABNORMAL LOW (ref 3.87–5.11)
RDW: 16.7 % — ABNORMAL HIGH (ref 11.5–15.5)
WBC: 8.6 10*3/uL (ref 4.0–10.5)
nRBC: 0 % (ref 0.0–0.2)

## 2019-12-18 LAB — RENAL FUNCTION PANEL
Albumin: 2.7 g/dL — ABNORMAL LOW (ref 3.5–5.0)
Anion gap: 10 (ref 5–15)
BUN: 12 mg/dL (ref 8–23)
CO2: 34 mmol/L — ABNORMAL HIGH (ref 22–32)
Calcium: 8.8 mg/dL — ABNORMAL LOW (ref 8.9–10.3)
Chloride: 96 mmol/L — ABNORMAL LOW (ref 98–111)
Creatinine, Ser: 0.94 mg/dL (ref 0.44–1.00)
GFR, Estimated: >60 mL/min (ref >60)
Glucose, Bld: 225 mg/dL — ABNORMAL HIGH (ref 70–99)
Phosphorus: 2.7 mg/dL (ref 2.5–4.6)
Potassium: 3.8 mmol/L (ref 3.5–5.1)
Sodium: 140 mmol/L (ref 135–145)

## 2019-12-18 LAB — GLUCOSE, CAPILLARY
Glucose-Capillary: 86 mg/dL (ref 70–99)
Glucose-Capillary: 159 mg/dL — ABNORMAL HIGH (ref 70–99)
Glucose-Capillary: 124 mg/dL — ABNORMAL HIGH (ref 70–99)
Glucose-Capillary: 149 mg/dL — ABNORMAL HIGH (ref 70–99)

## 2019-12-18 LAB — MAGNESIUM: Magnesium: 2.2 mg/dL (ref 1.7–2.4)

## 2019-12-18 MED ORDER — FUROSEMIDE 20 MG PO TABS
20.0000 mg | ORAL_TABLET | Freq: Every day | ORAL | Status: DC
  Administered 2019-12-18 – 2019-12-21 (×4): 20 mg via ORAL
  Filled 2019-12-18 (×4): qty 1

## 2019-12-18 NOTE — Plan of Care (Signed)
  Problem: Education: Goal: Knowledge of General Education information will improve Description: Including pain rating scale, medication(s)/side effects and non-pharmacologic comfort measures Outcome: Progressing   Problem: Clinical Measurements: Goal: Ability to maintain clinical measurements within normal limits will improve Outcome: Progressing   Problem: Clinical Measurements: Goal: Will remain free from infection Outcome: Progressing   Problem: Clinical Measurements: Goal: Diagnostic test results will improve Outcome: Progressing   Problem: Clinical Measurements: Goal: Respiratory complications will improve Outcome: Progressing   Problem: Clinical Measurements: Goal: Cardiovascular complication will be avoided Outcome: Progressing   Problem: Nutrition: Goal: Adequate nutrition will be maintained Outcome: Progressing   Problem: Coping: Goal: Level of anxiety will decrease Outcome: Progressing   Problem: Elimination: Goal: Will not experience complications related to urinary retention Outcome: Progressing   Problem: Pain Managment: Goal: General experience of comfort will improve Outcome: Progressing   Problem: Safety: Goal: Ability to remain free from injury will improve Outcome: Progressing   Problem: Skin Integrity: Goal: Risk for impaired skin integrity will decrease Outcome: Progressing   Problem: Urinary Elimination: Goal: Signs and symptoms of infection will decrease Outcome: Progressing   Problem: Health Behavior/Discharge Planning: Goal: Ability to manage health-related needs will improve Outcome: Not Progressing   Problem: Activity: Goal: Risk for activity intolerance will decrease Outcome: Not Progressing   Problem: Elimination: Goal: Will not experience complications related to bowel motility Outcome: Not Progressing   Problem: Health Behavior/Discharge Planning: Goal: Ability to manage health-related needs will improve Outcome: Not  Progressing   Problem: Activity: Goal: Risk for activity intolerance will decrease Outcome: Not Progressing   Problem: Elimination: Goal: Will not experience complications related to bowel motility Outcome: Not Progressing   Problem: Activity: Goal: Risk for activity intolerance will decrease Outcome: Not Progressing   Problem: Elimination: Goal: Will not experience complications related to bowel motility Outcome: Not Progressing

## 2019-12-18 NOTE — Progress Notes (Signed)
PROGRESS NOTE  Stacy Grimes IRC:[id number] DOB: 02-18-1950   PCP: Cyndi Bender, PA-C  Patient is from: Home.  Lives with husband.  Oriented x2 at baseline.  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  She also had pain in her left knee and buttock area.   In ED, mild tachycardia, tachypnea and soft BP.  83% on RA but recovered to 95% on 4 L.  BMP and CBC without significant finding.  Limited RVP nonreactive.  CT head, lumbar spine and hip without acute finding.  DG left femur and tibia and fibula without significant finding.  Troponin 25>> 78.  Twelve-lead EKG without significant finding.  BMP within normal.  UA without significant finding.  Admitted for altered mental status and elevated troponin and left leg pain.   Of note, patient's husband reports fall about 3 weeks ago when "buckled" her left knee.  She has not been able to ambulate well since then.  Also increased confusion from her baseline since then.  Left knee x-ray with spine fracture.  Orthopedic surgery consulted and recommended nonoperative management with knee injection, WBAT, PT and outpatient follow-up if no improvement.  She had Kenalog injection.   Therapy recommending SNF.  Subjective: Seen and examined earlier this morning.  No major events overnight of this morning. Left knee pain improved.  No complaints but not a great historian.  She is only oriented to self and partial place.  Objective: Vitals:   12/17/19 1925 12/18/19 0051 12/18/19 0439 12/18/19 0824  BP: (!) 134/56 (!) 138/57 140/68 (!) 128/56  Pulse: (!) 109 99 91 97  Resp: 20 20 20 19   Temp: 98.9 F (37.2 C) 98.8 F (37.1 C) 97.7 F (36.5  C) 97.7 F (36.5 C)  TempSrc: Oral Oral Oral Oral  SpO2: 94% 95% 96% 94%  Weight:      Height:       No intake or output data in the 24 hours ending 12/18/19 1020 Filed Weights   12/15/19 0715 12/16/19 0400 12/17/19 0444  Weight: 92.3 kg 91.3 kg 92 kg    Examination:  GENERAL: No apparent distress.  Nontoxic. HEENT: MMM.  Vision and hearing grossly intact.  NECK: Supple.  No apparent JVD.  RESP: 93% on 3 L.  No IWOB.  Fair aeration bilaterally. CVS:  RRR. Heart sounds normal.  ABD/GI/GU: BS+. Abd soft, NTND.  MSK/EXT:  Moves extremities.  Some weakness in LLE.  Tenderness to left knee. SKIN: no apparent skin lesion or wound NEURO: Awake.  Oriented to self and partial place.  No apparent focal neuro deficit. PSYCH: Calm. Normal affect.  Procedures:  None  Microbiology summarized: Limited RVP nonreactive.  Assessment & Plan: Acute left tibial spine fracture/left knee pain: knee x-ray reveals acute left tibial spine fracture.  CT lumbar spine and hip, and DG left femur and tibia and fibula without acute finding.  She has old L1 compression fracture with one third of weight loss. -Appreciate ortho recs: Kenalog injection (11/12), pain control, WBAT, PT and outpatient follow-up -Pain control with scheduled Tylenol, home gabapentin and as needed oxycodone -PT/OT  Elevated troponin: 25> 78> 84> 73.  Likely demand ischemia from acute hypoxic respiratory failure.  EKG  is reassuring.  No cardiopulmonary symptoms.  BNP within normal.  TTE with EF of 35-40% (previously 40 to 45%), global hypokinesis and indeterminate diastolic function.  No cardiopulmonary symptoms. -Home cardiac medications.  Chronic systolic CHF/N ICM s/p CRT-D: TTE as above.  BNP within normal.  No cardiopulmonary symptoms.  Appears euvolemic and compensated. -Schedule home Lasix at 20 mg daily. -Monitor fluid status and renal function  Acute on chronic hypoxic respiratory failure/chronic COPD: Likely from not  using home oxygen. CXR reassuring.  BNP within normal.  TTE as above. -Continue supplemental oxygen -Continue home Breo Ellipta and also inhalers. -Minimum oxygen to keep saturation above 90%.   Essential hypertension: Normotensive. -Continue current regimen  Uncontrolled NIDDM-2 with hyperglycemia: A1c 7.3%. Recent Labs  Lab 12/16/19 2021 12/17/19 0755 12/17/19 1117 12/17/19 2206 12/18/19 0830  GLUCAP 146* 138* 157* 155* 86  -Continue current insulin regimen  Dementia with behavioral disturbance: Reportedly found wandering in friend's backyard.  Currently oriented to self and partial place. -Reorientation and delirium precautions. -Avoid or minimize sedating medications.  Hypothyroidism: Stable -Continue home Synthroid.  Chronic gout: reports left knee pain.  Uric acid within normal. -Continue home allopurinol  History of PTSD/depression/anxiety:  stable. -Continue home medications  Debility/physical deconditioning: -Therapy recommending SNF.   Body mass index is 35.92 kg/m.         DVT prophylaxis:  Subcu Lovenox  Code Status: Full code Family Communication: Updated patient's husband at bedside on 11/12.  Status is: Inpatient  Remains inpatient appropriate because:Unsafe d/c plan   Dispo: The patient is from: Home              Anticipated d/c is to: SNF              Anticipated d/c date is: 2 days              Patient currently is medically stable to d/c.           Consultants:  Orthopedic surgery   Sch Meds:  Scheduled Meds: . allopurinol  300 mg Oral Daily  . aspirin EC  81 mg Oral Daily  . bisacodyl  10 mg Oral QHS  . bupivacaine  10 mL Infiltration Once  . digoxin  0.125 mg Oral QODAY  . donepezil  5 mg Oral QHS  . enoxaparin (LOVENOX) injection  45 mg Subcutaneous Q24H  . fluticasone furoate-vilanterol  1 puff Inhalation Daily  . furosemide  20 mg Oral Daily  . gabapentin  300 mg Oral BID  . hydrOXYzine  25 mg Oral QHS    . insulin aspart  0-15 Units Subcutaneous TID WC  . levothyroxine  112 mcg Oral QAC breakfast  . metoprolol succinate  25 mg Oral Daily  . pantoprazole  40 mg Oral Daily  . polyethylene glycol  17 g Oral Daily  . triamcinolone acetonide  40 mg Intra-articular Once  . venlafaxine  75 mg Oral BID  . vitamin B-12  1,000 mcg Oral BID   Continuous Infusions: PRN Meds:.acetaminophen, albuterol, melatonin, ondansetron (ZOFRAN) IV, oxyCODONE, tiotropium  Antimicrobials: Anti-infectives (From admission, onward)   Start     Dose/Rate Route Frequency Ordered Stop   12/15/19 1245  cephALEXin (KEFLEX) capsule 250 mg        250 mg Oral  Once 12/15/19 1244 12/15/19 1312   12/15/19 0000  cephALEXin (KEFLEX) 250 MG capsule  Status:  Discontinued        250 mg Oral 2 times daily 12/15/19 1249 12/17/19  I have personally reviewed the following labs and images: CBC: Recent Labs  Lab 12/15/19 0722 12/18/19 0609  WBC 11.4* 8.6  HGB 10.7* 9.6*  HCT 36.4 32.2*  MCV 89.0 88.0  PLT 366 304   BMP &GFR Recent Labs  Lab 12/15/19 0722 12/18/19 0609  NA 139 140  K 4.0 3.8  CL 97* 96*  CO2 30 34*  GLUCOSE 169* 225*  BUN 17 12  CREATININE 1.12* 0.94  CALCIUM 9.0 8.8*  MG  --  2.2  PHOS  --  2.7   Estimated Creatinine Clearance: 60 mL/min (by C-G formula based on SCr of 0.94 mg/dL). Liver & Pancreas: Recent Labs  Lab 12/18/19 0609  ALBUMIN 2.7*   No results for input(s): LIPASE, AMYLASE in the last 168 hours. No results for input(s): AMMONIA in the last 168 hours. Diabetic: Recent Labs    12/15/19 1704  HGBA1C 7.3*   Recent Labs  Lab 12/16/19 2021 12/17/19 0755 12/17/19 1117 12/17/19 2206 12/18/19 0830  GLUCAP 146* 138* 157* 155* 86   Cardiac Enzymes: No results for input(s): CKTOTAL, CKMB, CKMBINDEX, TROPONINI in the last 168 hours. No results for input(s): PROBNP in the last 8760 hours. Coagulation Profile: No results for input(s): INR, PROTIME in the last 168  hours. Thyroid Function Tests: No results for input(s): TSH, T4TOTAL, FREET4, T3FREE, THYROIDAB in the last 72 hours. Lipid Profile: No results for input(s): CHOL, HDL, LDLCALC, TRIG, CHOLHDL, LDLDIRECT in the last 72 hours. Anemia Panel: No results for input(s): VITAMINB12, FOLATE, FERRITIN, TIBC, IRON, RETICCTPCT in the last 72 hours. Urine analysis:    Component Value Date/Time   COLORURINE YELLOW (A) 12/15/2019 0905   APPEARANCEUR CLOUDY (A) 12/15/2019 0905   APPEARANCEUR Clear 07/31/2013 1658   LABSPEC 1.023 12/15/2019 0905   LABSPEC 1.018 07/31/2013 1658   PHURINE 5.0 12/15/2019 0905   GLUCOSEU 50 (A) 12/15/2019 0905   GLUCOSEU Negative 07/31/2013 1658   HGBUR NEGATIVE 12/15/2019 0905   BILIRUBINUR NEGATIVE 12/15/2019 0905   BILIRUBINUR Negative 07/31/2013 1658   KETONESUR NEGATIVE 12/15/2019 0905   PROTEINUR 100 (A) 12/15/2019 0905   UROBILINOGEN 0.2 03/28/2012 1526   NITRITE NEGATIVE 12/15/2019 0905   LEUKOCYTESUR NEGATIVE 12/15/2019 0905   LEUKOCYTESUR Negative 07/31/2013 1658   Sepsis Labs: Invalid input(s): PROCALCITONIN, [place]  Microbiology: Recent Results (from the past 240 hour(s))  Respiratory Panel by RT PCR (Flu A&B, Covid) - Nasopharyngeal Swab     Status: None   Collection Time: 12/15/19  7:22 AM   Specimen: Nasopharyngeal Swab  Result Value Ref Range Status   SARS Coronavirus 2 by RT PCR NEGATIVE NEGATIVE Final    Comment: (NOTE) SARS-CoV-2 target nucleic acids are NOT DETECTED.  The SARS-CoV-2 RNA is generally detectable in upper respiratoy specimens during the acute phase of infection. The lowest concentration of SARS-CoV-2 viral copies this assay can detect is 131 copies/mL. A negative result does not preclude SARS-Cov-2 infection and should not be used as the sole basis for treatment or other patient management decisions. A negative result may occur with  improper specimen collection/handling, submission of specimen other than  nasopharyngeal swab, presence of viral mutation(s) within the areas targeted by this assay, and inadequate number of viral copies (<131 copies/mL). A negative result must be combined with clinical observations, patient history, and epidemiological information. The expected result is Negative.  Fact Sheet for Patients:  PinkCheek.be  Fact Sheet for Healthcare Providers:  GravelBags.it  This test is no t yet approved or cleared by the  [place] Architectural technologist and  has been authorized for detection and/or diagnosis of SARS-CoV-2 by FDA under an Print production planner (EUA). This EUA will remain  in effect (meaning this test can be used) for the duration of the COVID-19 declaration under Section 564(b)(1) of the Act, 21 U.S.C. section 360bbb-3(b)(1), unless the authorization is terminated or revoked sooner.     Influenza A by PCR NEGATIVE NEGATIVE Final   Influenza B by PCR NEGATIVE NEGATIVE Final    Comment: (NOTE) The Xpert Xpress SARS-CoV-2/FLU/RSV assay is intended as an aid in  the diagnosis of influenza from Nasopharyngeal swab specimens and  should not be used as a sole basis for treatment. Nasal washings and  aspirates are unacceptable for Xpert Xpress SARS-CoV-2/FLU/RSV  testing.  Fact Sheet for Patients: PinkCheek.be  Fact Sheet for Healthcare Providers: GravelBags.it  This test is not yet approved or cleared by the [place] FDA and  has been authorized for detection and/or diagnosis of SARS-CoV-2 by  FDA under an Emergency Use Authorization (EUA). This EUA will remain  in effect (meaning this test can be used) for the duration of the  Covid-19 declaration under Section 564(b)(1) of the Act, 21  U.S.C. section 360bbb-3(b)(1), unless the authorization is  terminated or revoked. Performed at [facility], [address] [place], [place]  [address]   Urine Culture     Status: None   Collection Time: 12/15/19  9:05 AM   Specimen: Urine, Random  Result Value Ref Range Status   Specimen Description   Final    URINE, RANDOM Performed at [facility], [address], [place], [place] [address]    Special Requests   Final    NONE Performed at [facility], [address] [place], [place] [address]    Culture   Final    NO GROWTH Performed at [facility], [place] [address], [place],  [address]    Report Status 12/16/2019 FINAL  Final    Radiology Studies: No results found.    Lamiah Marmol T. Harrison  If 7PM-7AM, please contact night-coverage [email or web address] 12/18/2019, 10:20 AM

## 2019-12-19 DIAGNOSIS — S82115B Nondisplaced fracture of left tibial spine, initial encounter for open fracture type I or II: Secondary | ICD-10-CM | POA: Diagnosis not present

## 2019-12-19 DIAGNOSIS — R778 Other specified abnormalities of plasma proteins: Secondary | ICD-10-CM | POA: Diagnosis not present

## 2019-12-19 DIAGNOSIS — M25562 Pain in left knee: Secondary | ICD-10-CM | POA: Diagnosis not present

## 2019-12-19 DIAGNOSIS — F0391 Unspecified dementia with behavioral disturbance: Secondary | ICD-10-CM | POA: Diagnosis not present

## 2019-12-19 LAB — GLUCOSE, CAPILLARY
Glucose-Capillary: 96 mg/dL (ref 70–99)
Glucose-Capillary: 111 mg/dL — ABNORMAL HIGH (ref 70–99)
Glucose-Capillary: 111 mg/dL — ABNORMAL HIGH (ref 70–99)
Glucose-Capillary: 110 mg/dL — ABNORMAL HIGH (ref 70–99)

## 2019-12-19 NOTE — Progress Notes (Signed)
Secure chat Md Gonfa, taye pt is requesting an ointment for red raised areas around mouth and chin. No response. Paged Rufina Falco NP.

## 2019-12-19 NOTE — Progress Notes (Signed)
PROGRESS NOTE  Stacy Grimes YSA:[id number] DOB: January 13, 1951   PCP: Cyndi Bender, PA-C  Patient is from: Home.  Lives with husband.  Oriented x2 at baseline.  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  She also had pain in her left knee and buttock area.   In ED, mild tachycardia, tachypnea and soft BP.  83% on RA but recovered to 95% on 4 L.  BMP and CBC without significant finding.  Limited RVP nonreactive.  CT head, lumbar spine and hip without acute finding.  DG left femur and tibia and fibula without significant finding.  Troponin 25>> 78.  Twelve-lead EKG without significant finding.  BMP within normal.  UA without significant finding.  Admitted for altered mental status and elevated troponin and left leg pain.   Of note, patient's husband reports fall about 3 weeks ago when "buckled" her left knee.  She has not been able to ambulate well since then.  Also increased confusion from her baseline since then.  Left knee x-ray with spine fracture.  Orthopedic surgery consulted and recommended nonoperative management with knee injection, WBAT, PT and outpatient follow-up if no improvement.  She had Kenalog injection.   Therapy recommending SNF.  Subjective: Seen and examined earlier this morning.  No major events overnight of this morning.  No complaints.  If knee pain improved after an injection.  Seen for full range of motion in her left knee now.  Objective: Vitals:   12/19/19 0500 12/19/19 0800 12/19/19 1117 12/19/19 1149  BP: (!) 154/58 140/63 (!) 145/61 130/74  Pulse: 83 88 80 86  Resp: 18 18  17   Temp: 98.2 F (36.8 C) 97.6 F (36.4 C)  97.9 F (36.6 C)  TempSrc:  Oral Oral  Oral  SpO2: 97% 98%  96%  Weight:      Height:        Intake/Output Summary (Last 24 hours) at 12/19/2019 1334 Last data filed at 12/19/2019 0951 Gross per 24 hour  Intake 600 ml  Output --  Net 600 ml   Filed Weights   12/15/19 0715 12/16/19 0400 12/17/19 0444  Weight: 92.3 kg 91.3 kg 92 kg    Examination:  GENERAL: No apparent distress.  Nontoxic. HEENT: MMM.  Vision and hearing grossly intact.  NECK: Supple.  No apparent JVD.  RESP: 97% on 3 L.  No IWOB.  Fair aeration bilaterally. CVS:  RRR. Heart sounds normal.  ABD/GI/GU: BS+. Abd soft, NTND.  MSK/EXT:  Moves extremities.  FROM in left knee.  No swelling.  Mild tenderness. SKIN: no apparent skin lesion or wound NEURO: Awake, alert and oriented to self and partial place.  No apparent focal neuro deficit. PSYCH: Calm. Normal affect.  Procedures:  None  Microbiology summarized: Limited RVP nonreactive.  Assessment & Plan: Acute left tibial spine fracture/left knee pain: knee x-ray reveals acute left tibial spine fracture.  CT lumbar spine and hip, and DG left femur and tibia and fibula without acute finding.  She has old L1 compression fracture with one third of weight loss. -Appreciate ortho recs: Kenalog injection (11/12), pain control, WBAT, PT and outpatient follow-up -Pain control with scheduled Tylenol, home gabapentin and as needed oxycodone -PT/OT  Elevated troponin: 25> 78> 84> 73.  Likely demand ischemia from acute hypoxic respiratory failure.  EKG is reassuring.  No cardiopulmonary symptoms.  BNP within normal.  TTE with EF of 35-40% (previously 40 to 45%), global hypokinesis and indeterminate diastolic function.  No cardiopulmonary symptoms. -Home cardiac medications.  Chronic systolic CHF/N ICM s/p CRT-D: TTE as above.  BNP within normal.  No cardiopulmonary symptoms.  Appears euvolemic and compensated. -Continue p.o. Lasix 20 mg daily  -Monitor fluid status and renal function  Acute on  chronic hypoxic respiratory failure/chronic COPD: Likely from not using home oxygen. CXR reassuring.  BNP within normal.  TTE as above. -Continue supplemental oxygen -Continue home Breo Ellipta and also inhalers. -Minimum oxygen to keep saturation above 90%.  Essential hypertension: Normotensive. -Continue current regimen  Uncontrolled NIDDM-2 with hyperglycemia: A1c 7.3%. Recent Labs  Lab 12/18/19 1155 12/18/19 1603 12/18/19 2012 12/19/19 0829 12/19/19 1147  GLUCAP 159* 124* 149* 96 111*  -Continue current insulin regimen  Dementia with behavioral disturbance: Reportedly found wandering in friend's backyard.  Currently oriented to self and partial place. -Reorientation and delirium precautions. -Avoid or minimize sedating medications.  Hypothyroidism: Stable -Continue home Synthroid.  Chronic gout: reports left knee pain.  Uric acid within normal. -Continue home allopurinol  History of PTSD/depression/anxiety:  stable. -Continue home medications  Debility/physical deconditioning: -Therapy recommending SNF.   Body mass index is 35.92 kg/m.         DVT prophylaxis:  Subcu Lovenox  Code Status: Full code Family Communication: Updated patient's husband at bedside on 11/12.  None at bedside today.  Status is: Inpatient  Remains inpatient appropriate because:Unsafe d/c plan   Dispo: The patient is from: Home              Anticipated d/c is to: SNF              Anticipated d/c date is: 1 day              Patient currently is medically stable to d/c.           Consultants:  Orthopedic surgery   Sch Meds:  Scheduled Meds: . allopurinol  300 mg Oral Daily  . aspirin EC  81 mg Oral Daily  . bisacodyl  10 mg Oral QHS  . bupivacaine  10 mL Infiltration Once  . digoxin  0.125 mg Oral QODAY  . donepezil  5 mg Oral QHS  . enoxaparin (LOVENOX) injection  45 mg Subcutaneous Q24H  . fluticasone furoate-vilanterol  1 puff Inhalation Daily  .  furosemide  20 mg Oral Daily  . gabapentin  300 mg Oral BID  . hydrOXYzine  25 mg Oral QHS  . insulin aspart  0-15 Units Subcutaneous TID WC  . levothyroxine  112 mcg Oral QAC breakfast  . metoprolol succinate  25 mg Oral Daily  . pantoprazole  40 mg Oral Daily  . polyethylene glycol  17 g Oral Daily  . triamcinolone acetonide  40 mg Intra-articular Once  . venlafaxine  75 mg Oral BID  . vitamin B-12  1,000 mcg Oral BID   Continuous Infusions: PRN Meds:.acetaminophen, albuterol, melatonin, ondansetron (ZOFRAN) IV, oxyCODONE, tiotropium  Antimicrobials: Anti-infectives (From admission, onward)   Start     Dose/Rate Route Frequency Ordered Stop   12/15/19 1245  cephALEXin (KEFLEX) capsule 250 mg        250 mg Oral  Once 12/15/19 1244 12/15/19 1312   12/15/19 0000  cephALEXin (KEFLEX) 250 MG capsule  Status:  Discontinued        250 mg Oral 2 times daily 12/15/19 1249 12/17/19        I have personally reviewed the following labs and images: CBC: Recent Labs  Lab 12/15/19 0722 12/18/19 0609  WBC 11.4* 8.6  HGB 10.7* 9.6*  HCT 36.4 32.2*  MCV 89.0 88.0  PLT 366 304   BMP &GFR Recent Labs  Lab 12/15/19 0722 12/18/19 0609  NA 139 140  K 4.0 3.8  CL 97* 96*  CO2 30 34*  GLUCOSE 169* 225*  BUN 17 12  CREATININE 1.12* 0.94  CALCIUM 9.0 8.8*  MG  --  2.2  PHOS  --  2.7   Estimated Creatinine Clearance: 60 mL/min (by C-G formula based on SCr of 0.94 mg/dL). Liver & Pancreas: Recent Labs  Lab 12/18/19 0609  ALBUMIN 2.7*   No results for input(s): LIPASE, AMYLASE in the last 168 hours. No results for input(s): AMMONIA in the last 168 hours. Diabetic: No results for input(s): HGBA1C in the last 72 hours. Recent Labs  Lab 12/18/19 1155 12/18/19 1603 12/18/19 2012 12/19/19 0829 12/19/19 1147  GLUCAP 159* 124* 149* 96 111*   Cardiac Enzymes: No results for input(s): CKTOTAL, CKMB, CKMBINDEX, TROPONINI in the last 168 hours. No results for input(s): PROBNP in  the last 8760 hours. Coagulation Profile: No results for input(s): INR, PROTIME in the last 168 hours. Thyroid Function Tests: No results for input(s): TSH, T4TOTAL, FREET4, T3FREE, THYROIDAB in the last 72 hours. Lipid Profile: No results for input(s): CHOL, HDL, LDLCALC, TRIG, CHOLHDL, LDLDIRECT in the last 72 hours. Anemia Panel: No results for input(s): VITAMINB12, FOLATE, FERRITIN, TIBC, IRON, RETICCTPCT in the last 72 hours. Urine analysis:    Component Value Date/Time   COLORURINE YELLOW (A) 12/15/2019 0905   APPEARANCEUR CLOUDY (A) 12/15/2019 0905   APPEARANCEUR Clear 07/31/2013 1658   LABSPEC 1.023 12/15/2019 0905   LABSPEC 1.018 07/31/2013 1658   PHURINE 5.0 12/15/2019 0905   GLUCOSEU 50 (A) 12/15/2019 0905   GLUCOSEU Negative 07/31/2013 1658   HGBUR NEGATIVE 12/15/2019 0905   BILIRUBINUR NEGATIVE 12/15/2019 0905   BILIRUBINUR Negative 07/31/2013 1658   KETONESUR NEGATIVE 12/15/2019 0905   PROTEINUR 100 (A) 12/15/2019 0905   UROBILINOGEN 0.2 03/28/2012 1526   NITRITE NEGATIVE 12/15/2019 0905   LEUKOCYTESUR NEGATIVE 12/15/2019 0905   LEUKOCYTESUR Negative 07/31/2013 1658   Sepsis Labs: Invalid input(s): PROCALCITONIN, [place]  Microbiology: Recent Results (from the past 240 hour(s))  Respiratory Panel by RT PCR (Flu A&B, Covid) - Nasopharyngeal Swab     Status: None   Collection Time: 12/15/19  7:22 AM   Specimen: Nasopharyngeal Swab  Result Value Ref Range Status   SARS Coronavirus 2 by RT PCR NEGATIVE NEGATIVE Final    Comment: (NOTE) SARS-CoV-2 target nucleic acids are NOT DETECTED.  The SARS-CoV-2 RNA is generally detectable in upper respiratoy specimens during the acute phase of infection. The lowest concentration of SARS-CoV-2 viral copies this assay can detect is 131 copies/mL. A negative result does not preclude SARS-Cov-2 infection and should not be used as the sole basis for treatment or other patient management decisions. A negative result may  occur with  improper specimen collection/handling, submission of specimen other than nasopharyngeal swab, presence of viral mutation(s) within the areas targeted by this assay, and inadequate number of viral copies (<131 copies/mL). A negative result must be combined with clinical observations, patient history, and epidemiological information. The expected result is Negative.  Fact Sheet  for Patients:  PinkCheek.be  Fact Sheet for Healthcare Providers:  GravelBags.it  This test is no t yet approved or cleared by the [place] FDA and  has been authorized for detection and/or diagnosis of SARS-CoV-2 by FDA under an Emergency Use Authorization (EUA). This EUA will remain  in effect (meaning this test can be used) for the duration of the COVID-19 declaration under Section 564(b)(1) of the Act, 21 U.S.C. section 360bbb-3(b)(1), unless the authorization is terminated or revoked sooner.     Influenza A by PCR NEGATIVE NEGATIVE Final   Influenza B by PCR NEGATIVE NEGATIVE Final    Comment: (NOTE) The Xpert Xpress SARS-CoV-2/FLU/RSV assay is intended as an aid in  the diagnosis of influenza from Nasopharyngeal swab specimens and  should not be used as a sole basis for treatment. Nasal washings and  aspirates are unacceptable for Xpert Xpress SARS-CoV-2/FLU/RSV  testing.  Fact Sheet for Patients: PinkCheek.be  Fact Sheet for Healthcare Providers: GravelBags.it  This test is not yet approved or cleared by the [place] FDA and  has been authorized for detection and/or diagnosis of SARS-CoV-2 by  FDA under an Emergency Use Authorization (EUA). This EUA will remain  in effect (meaning this test can be used) for the duration of the  Covid-19 declaration under Section 564(b)(1) of the Act, 21  U.S.C. section 360bbb-3(b)(1), unless the authorization is  terminated or  revoked. Performed at [facility], [address], [place], [place] [address]   Urine Culture     Status: None   Collection Time: 12/15/19  9:05 AM   Specimen: Urine, Random  Result Value Ref Range Status   Specimen Description   Final    URINE, RANDOM Performed at [facility], [address], [place], [place] [address]    Special Requests   Final    NONE Performed at [facility], [address] [place], [place] [address]    Culture   Final    NO GROWTH Performed at [facility], [place] [address], [place], [place] [address]    Report Status 12/16/2019 FINAL  Final    Radiology Studies: No results found.    Reverie Vaquera T. [place]  If 7PM-7AM, please contact night-coverage [email or web address] 12/19/2019, 1:34 PM

## 2019-12-19 NOTE — Plan of Care (Signed)
  Problem: Education: Goal: Knowledge of General Education information will improve Description Including pain rating scale, medication(s)/side effects and non-pharmacologic comfort measures Outcome: Progressing   

## 2019-12-20 DIAGNOSIS — F0391 Unspecified dementia with behavioral disturbance: Secondary | ICD-10-CM | POA: Diagnosis not present

## 2019-12-20 DIAGNOSIS — M25562 Pain in left knee: Secondary | ICD-10-CM | POA: Diagnosis not present

## 2019-12-20 DIAGNOSIS — R778 Other specified abnormalities of plasma proteins: Secondary | ICD-10-CM | POA: Diagnosis not present

## 2019-12-20 DIAGNOSIS — S82115B Nondisplaced fracture of left tibial spine, initial encounter for open fracture type I or II: Secondary | ICD-10-CM | POA: Diagnosis not present

## 2019-12-20 LAB — GLUCOSE, CAPILLARY
Glucose-Capillary: 141 mg/dL — ABNORMAL HIGH (ref 70–99)
Glucose-Capillary: 182 mg/dL — ABNORMAL HIGH (ref 70–99)
Glucose-Capillary: 117 mg/dL — ABNORMAL HIGH (ref 70–99)
Glucose-Capillary: 169 mg/dL — ABNORMAL HIGH (ref 70–99)

## 2019-12-20 MED ORDER — HYDROXYZINE HCL 25 MG PO TABS
25.0000 mg | ORAL_TABLET | Freq: Every evening | ORAL | Status: DC | PRN
  Filled 2019-12-20: qty 1

## 2019-12-20 NOTE — TOC Progression Note (Addendum)
Transition of Care [facility]) - Progression Note    Patient Details  Name: Stacy Grimes MRN: [id number] Date of Birth: March 04, 1950  Transition of Care [facility]) CM/SW [place], LCSW Phone Number: 12/20/2019, 10:41 AM  Clinical Narrative:   No bed offers so far.  Reached out to: [facility]- asked Magda Paganini to review [facility] [place]- asked Tammy to review, she inquired if patient has any behaviors, asked RN then told Tammy patient has some sundowners (confusion) at night but no aggressive behaviors. [facility]- asked Kieth Brightly to review [facility]- asked Neoma Laming to review    1:30- Bed offers from [facility]. Left voicemail for spouse requesting a return call to see which SNF he prefers.   2:50- Spoke with spouse and informed him of bed offers. Informed him of StartupExpense.be. He chose [facility]. He asked about visitation since patient is not vaccinated. CSW asked [facility]my at [facility] who is checking. Sent [facility] contact information to spouse via email per his request. Tammy reported they will have a bed for patient tomorrow. Updated MD.    3:00- Tammy reported [facility] is allowing visitors even for unvaccinated patients as long as they wear a mask and check in at [facility]. Updated spouse.   Expected Discharge Plan: [place] Barriers to Discharge: Continued Medical Work up  Expected Discharge Plan and Services Expected Discharge Plan: [place] In-house Referral: Clinical Social Work   Post Acute Care Choice: [place] arrangements for the past 2 months: [place] Expected Discharge Date: 12/17/19                         HH Arranged: RN, PT, OT, Nurse's Aide [place] Agency: [place] ([place]) Date [place]: 12/16/19 Time [place]: 1544 Representative spoke with at [place]: [place] (SDOH)  Interventions    Readmission Risk Interventions No flowsheet data found.
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
## 2019-12-20 NOTE — Progress Notes (Signed)
PROGRESS NOTE  Stacy Grimes ZOX:[id number] DOB: May 07, 1950   PCP: Cyndi Bender, PA-C  Patient is from: Home.  Lives with husband.  Oriented x2 at baseline.  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  She also had pain in her left knee and buttock area.   In ED, mild tachycardia, tachypnea and soft BP.  83% on RA but recovered to 95% on 4 L.  BMP and CBC without significant finding.  Limited RVP nonreactive.  CT head, lumbar spine and hip without acute finding.  DG left femur and tibia and fibula without significant finding.  Troponin 25>> 78.  Twelve-lead EKG without significant finding.  BMP within normal.  UA without significant finding.  Admitted for altered mental status and elevated troponin and left leg pain.   Of note, patient's husband reports fall about 3 weeks ago when "buckled" her left knee.  She has not been able to ambulate well since then.  Also increased confusion from her baseline since then.  Left knee x-ray with spine fracture.  Orthopedic surgery consulted and recommended nonoperative management with knee injection, WBAT, PT and outpatient follow-up if no improvement.  She had Kenalog injection.   Therapy recommending SNF.  Waiting on SNF bed.  Subjective: Seen and examined earlier this morning.  No major events overnight or this morning.   Left knee pain improved. No complaints but not a great historian.  She is oriented to self and partial place.  She thinks she is at [facility] in [place]. Objective: Vitals:   12/20/19 0024 12/20/19 0741 12/20/19 1238 12/20/19 1300  BP: (!) 122/42 (!) 144/70 (!) 102/58   Pulse: 91 90 71   Resp: 20 19 17    Temp: 97.6  F (36.4 C) 98.4 F (36.9 C) 98.7 F (37.1 C)   TempSrc: Oral     SpO2: 95% 94% 97% 96%  Weight:      Height:        Intake/Output Summary (Last 24 hours) at 12/20/2019 1451 Last data filed at 12/20/2019 1350 Gross per 24 hour  Intake 840 ml  Output --  Net 840 ml   Filed Weights   12/15/19 0715 12/16/19 0400 12/17/19 0444  Weight: 92.3 kg 91.3 kg 92 kg    Examination:  GENERAL: No apparent distress.  Nontoxic. HEENT: MMM.  Vision and hearing grossly intact.  NECK: Supple.  No apparent JVD.  RESP: 95% on 2 L.  No IWOB.  Fair aeration bilaterally. CVS:  RRR. Heart sounds normal.  ABD/GI/GU: BS+. Abd soft, NTND.  MSK/EXT:  Moves extremities. No apparent deformity. No edema.  SKIN: Mild perioral erythema on the left. NEURO: Awake and alert.  Oriented to self and partial place.  No apparent focal neuro deficit. PSYCH: Calm. Normal affect.  Procedures:  None  Microbiology summarized: Limited RVP nonreactive.  Assessment & Plan: Acute left tibial spine fracture/left knee pain: knee x-ray reveals acute left tibial spine fracture.  CT lumbar spine and hip, and DG left femur and tibia and fibula without acute finding.  She has old L1 compression fracture with one third of weight loss. -Appreciate ortho recs: Kenalog injection (11/12), pain control, WBAT, PT and outpatient follow-up -Pain control  with scheduled Tylenol, home gabapentin and as needed oxycodone -PT/OT-waiting on SNF bed.  Elevated troponin: 25> 78> 84> 73.  Likely demand ischemia from acute hypoxic respiratory failure.  EKG is reassuring.  No cardiopulmonary symptoms.  BNP within normal.  TTE with EF of 35-40% (previously 40 to 45%), global hypokinesis and indeterminate diastolic function.  No cardiopulmonary symptoms. -Continue Home cardiac medications.  Chronic systolic CHF/N ICM s/p CRT-D: TTE as above.  BNP within normal.  No cardiopulmonary symptoms.  Appears euvolemic and compensated.  I&O  incomplete. -Continue p.o. Lasix 20 mg daily  -Monitor fluid status and renal function  Acute on chronic hypoxic respiratory failure/chronic COPD: Likely from not using home oxygen. CXR reassuring.  BNP within normal.  TTE as above. -Continue supplemental oxygen -Continue home Lasix and inhalers as above. -Minimum oxygen to keep saturation above 90%.  Essential hypertension: Normotensive. -Continue current regimen  Uncontrolled NIDDM-2 with hyperglycemia: A1c 7.3%. Recent Labs  Lab 12/19/19 1147 12/19/19 1559 12/19/19 2151 12/20/19 0741 12/20/19 1236  GLUCAP 111* 111* 110* 141* 182*  -Continue current insulin regimen  Dementia with behavioral disturbance: Reportedly found wandering in friend's backyard.  Currently oriented to self and partial place. -Reorientation and delirium precautions. -Avoid or minimize sedating medications.  Hypothyroidism: Stable -Continue home Synthroid.  Chronic gout: reports left knee pain.  Uric acid within normal. -Continue home allopurinol  History of PTSD/depression/anxiety:  stable. -Continue home medications  Debility/physical deconditioning: -Therapy recommending SNF.   Body mass index is 35.92 kg/m.         DVT prophylaxis:  Subcu Lovenox  Code Status: Full code Family Communication: Updated patient's husband at bedside  Status is: Inpatient  Remains inpatient appropriate because:Unsafe d/c plan   Dispo: The patient is from: Home              Anticipated d/c is to: SNF              Anticipated d/c date is: 1 day              Patient currently is medically stable to d/c.           Consultants:  Orthopedic surgery   Sch Meds:  Scheduled Meds: . allopurinol  300 mg Oral Daily  . aspirin EC  81 mg Oral Daily  . bisacodyl  10 mg Oral QHS  . bupivacaine  10 mL Infiltration Once  . digoxin  0.125 mg Oral QODAY  . donepezil  5 mg Oral QHS  . enoxaparin (LOVENOX) injection  45 mg Subcutaneous Q24H  .  fluticasone furoate-vilanterol  1 puff Inhalation Daily  . furosemide  20 mg Oral Daily  . gabapentin  300 mg Oral BID  . insulin aspart  0-15 Units Subcutaneous TID WC  . levothyroxine  112 mcg Oral QAC breakfast  . metoprolol succinate  25 mg Oral Daily  . pantoprazole  40 mg Oral Daily  . polyethylene glycol  17 g Oral Daily  . triamcinolone acetonide  40 mg Intra-articular Once  . venlafaxine  75 mg Oral BID  . vitamin B-12  1,000 mcg Oral BID   Continuous Infusions: PRN Meds:.acetaminophen, albuterol, hydrOXYzine, melatonin, ondansetron (ZOFRAN) IV, oxyCODONE, tiotropium  Antimicrobials: Anti-infectives (From admission, onward)   Start     Dose/Rate Route Frequency Ordered Stop   12/15/19 1245  cephALEXin (KEFLEX) capsule 250 mg        250 mg Oral  Once 12/15/19 1244 12/15/19 1312   12/15/19 0000  cephALEXin (KEFLEX)  250 MG capsule  Status:  Discontinued        250 mg Oral 2 times daily 12/15/19 1249 12/17/19        I have personally reviewed the following labs and images: CBC: Recent Labs  Lab 12/15/19 0722 12/18/19 0609  WBC 11.4* 8.6  HGB 10.7* 9.6*  HCT 36.4 32.2*  MCV 89.0 88.0  PLT 366 304   BMP &GFR Recent Labs  Lab 12/15/19 0722 12/18/19 0609  NA 139 140  K 4.0 3.8  CL 97* 96*  CO2 30 34*  GLUCOSE 169* 225*  BUN 17 12  CREATININE 1.12* 0.94  CALCIUM 9.0 8.8*  MG  --  2.2  PHOS  --  2.7   Estimated Creatinine Clearance: 60 mL/min (by C-G formula based on SCr of 0.94 mg/dL). Liver & Pancreas: Recent Labs  Lab 12/18/19 0609  ALBUMIN 2.7*   No results for input(s): LIPASE, AMYLASE in the last 168 hours. No results for input(s): AMMONIA in the last 168 hours. Diabetic: No results for input(s): HGBA1C in the last 72 hours. Recent Labs  Lab 12/19/19 1147 12/19/19 1559 12/19/19 2151 12/20/19 0741 12/20/19 1236  GLUCAP 111* 111* 110* 141* 182*   Cardiac Enzymes: No results for input(s): CKTOTAL, CKMB, CKMBINDEX, TROPONINI in the last 168  hours. No results for input(s): PROBNP in the last 8760 hours. Coagulation Profile: No results for input(s): INR, PROTIME in the last 168 hours. Thyroid Function Tests: No results for input(s): TSH, T4TOTAL, FREET4, T3FREE, THYROIDAB in the last 72 hours. Lipid Profile: No results for input(s): CHOL, HDL, LDLCALC, TRIG, CHOLHDL, LDLDIRECT in the last 72 hours. Anemia Panel: No results for input(s): VITAMINB12, FOLATE, FERRITIN, TIBC, IRON, RETICCTPCT in the last 72 hours. Urine analysis:    Component Value Date/Time   COLORURINE YELLOW (A) 12/15/2019 0905   APPEARANCEUR CLOUDY (A) 12/15/2019 0905   APPEARANCEUR Clear 07/31/2013 1658   LABSPEC 1.023 12/15/2019 0905   LABSPEC 1.018 07/31/2013 1658   PHURINE 5.0 12/15/2019 0905   GLUCOSEU 50 (A) 12/15/2019 0905   GLUCOSEU Negative 07/31/2013 1658   HGBUR NEGATIVE 12/15/2019 0905   BILIRUBINUR NEGATIVE 12/15/2019 0905   BILIRUBINUR Negative 07/31/2013 1658   KETONESUR NEGATIVE 12/15/2019 0905   PROTEINUR 100 (A) 12/15/2019 0905   UROBILINOGEN 0.2 03/28/2012 1526   NITRITE NEGATIVE 12/15/2019 0905   LEUKOCYTESUR NEGATIVE 12/15/2019 0905   LEUKOCYTESUR Negative 07/31/2013 1658   Sepsis Labs: Invalid input(s): PROCALCITONIN, [place]  Microbiology: Recent Results (from the past 240 hour(s))  Respiratory Panel by RT PCR (Flu A&B, Covid) - Nasopharyngeal Swab     Status: None   Collection Time: 12/15/19  7:22 AM   Specimen: Nasopharyngeal Swab  Result Value Ref Range Status   SARS Coronavirus 2 by RT PCR NEGATIVE NEGATIVE Final    Comment: (NOTE) SARS-CoV-2 target nucleic acids are NOT DETECTED.  The SARS-CoV-2 RNA is generally detectable in upper respiratoy specimens during the acute phase of infection. The lowest concentration of SARS-CoV-2 viral copies this assay can detect is 131 copies/mL. A negative result does not preclude SARS-Cov-2 infection and should not be used as the sole basis for treatment or other patient  management decisions. A negative result may occur with  improper specimen collection/handling, submission of specimen other than nasopharyngeal swab, presence of viral mutation(s) within the areas targeted by this assay, and inadequate number of viral copies (<131 copies/mL). A negative result must be combined with clinical observations, patient history, and epidemiological information. The expected result is  Negative.  Fact Sheet for Patients:  PinkCheek.be  Fact Sheet for Healthcare Providers:  GravelBags.it  This test is no t yet approved or cleared by the [place] FDA and  has been authorized for detection and/or diagnosis of SARS-CoV-2 by FDA under an Emergency Use Authorization (EUA). This EUA will remain  in effect (meaning this test can be used) for the duration of the COVID-19 declaration under Section 564(b)(1) of the Act, 21 U.S.C. section 360bbb-3(b)(1), unless the authorization is terminated or revoked sooner.     Influenza A by PCR NEGATIVE NEGATIVE Final   Influenza B by PCR NEGATIVE NEGATIVE Final    Comment: (NOTE) The Xpert Xpress SARS-CoV-2/FLU/RSV assay is intended as an aid in  the diagnosis of influenza from Nasopharyngeal swab specimens and  should not be used as a sole basis for treatment. Nasal washings and  aspirates are unacceptable for Xpert Xpress SARS-CoV-2/FLU/RSV  testing.  Fact Sheet for Patients: PinkCheek.be  Fact Sheet for Healthcare Providers: GravelBags.it  This test is not yet approved or cleared by the [place] FDA and  has been authorized for detection and/or diagnosis of SARS-CoV-2 by  FDA under an Emergency Use Authorization (EUA). This EUA will remain  in effect (meaning this test can be used) for the duration of the  Covid-19 declaration under Section 564(b)(1) of the Act, 21  U.S.C. section 360bbb-3(b)(1),  unless the authorization is  terminated or revoked. Performed at [facility], [address], [place], [place] [address]   Urine Culture     Status: None   Collection Time: 12/15/19  9:05 AM   Specimen: Urine, Random  Result Value Ref Range Status   Specimen Description   Final    URINE, RANDOM Performed at [facility], [address] [place], [place] [address]    Special Requests   Final    NONE Performed at [facility], [address], [place], [place] [address]    Culture   Final    NO GROWTH Performed at [facility], [place] [address] [place], [place] [address]    Report Status 12/16/2019 FINAL  Final    Radiology Studies: No results found.    Kayelyn Lemon T. Parker  If 7PM-7AM, please contact night-coverage [email or web address] 12/20/2019, 2:51 PM

## 2019-12-21 DIAGNOSIS — F0391 Unspecified dementia with behavioral disturbance: Secondary | ICD-10-CM | POA: Diagnosis not present

## 2019-12-21 DIAGNOSIS — J99 Respiratory disorders in diseases classified elsewhere: Secondary | ICD-10-CM | POA: Diagnosis not present

## 2019-12-21 DIAGNOSIS — R778 Other specified abnormalities of plasma proteins: Secondary | ICD-10-CM | POA: Diagnosis not present

## 2019-12-21 DIAGNOSIS — R498 Other voice and resonance disorders: Secondary | ICD-10-CM | POA: Diagnosis not present

## 2019-12-21 DIAGNOSIS — E119 Type 2 diabetes mellitus without complications: Secondary | ICD-10-CM | POA: Diagnosis not present

## 2019-12-21 DIAGNOSIS — R488 Other symbolic dysfunctions: Secondary | ICD-10-CM | POA: Diagnosis not present

## 2019-12-21 DIAGNOSIS — I4891 Unspecified atrial fibrillation: Secondary | ICD-10-CM | POA: Diagnosis not present

## 2019-12-21 DIAGNOSIS — R2681 Unsteadiness on feet: Secondary | ICD-10-CM | POA: Diagnosis not present

## 2019-12-21 DIAGNOSIS — R279 Unspecified lack of coordination: Secondary | ICD-10-CM | POA: Diagnosis not present

## 2019-12-21 DIAGNOSIS — F0150 Vascular dementia without behavioral disturbance: Secondary | ICD-10-CM | POA: Diagnosis not present

## 2019-12-21 DIAGNOSIS — S32010S Wedge compression fracture of first lumbar vertebra, sequela: Secondary | ICD-10-CM | POA: Diagnosis not present

## 2019-12-21 DIAGNOSIS — E669 Obesity, unspecified: Secondary | ICD-10-CM | POA: Diagnosis not present

## 2019-12-21 DIAGNOSIS — R5381 Other malaise: Secondary | ICD-10-CM | POA: Diagnosis not present

## 2019-12-21 DIAGNOSIS — E039 Hypothyroidism, unspecified: Secondary | ICD-10-CM | POA: Diagnosis not present

## 2019-12-21 DIAGNOSIS — I2510 Atherosclerotic heart disease of native coronary artery without angina pectoris: Secondary | ICD-10-CM | POA: Diagnosis not present

## 2019-12-21 DIAGNOSIS — S82115A Nondisplaced fracture of left tibial spine, initial encounter for closed fracture: Secondary | ICD-10-CM

## 2019-12-21 DIAGNOSIS — F32A Depression, unspecified: Secondary | ICD-10-CM | POA: Diagnosis not present

## 2019-12-21 DIAGNOSIS — R4182 Altered mental status, unspecified: Secondary | ICD-10-CM | POA: Diagnosis not present

## 2019-12-21 DIAGNOSIS — I10 Essential (primary) hypertension: Secondary | ICD-10-CM

## 2019-12-21 DIAGNOSIS — M5136 Other intervertebral disc degeneration, lumbar region: Secondary | ICD-10-CM | POA: Diagnosis not present

## 2019-12-21 DIAGNOSIS — E569 Vitamin deficiency, unspecified: Secondary | ICD-10-CM | POA: Diagnosis not present

## 2019-12-21 DIAGNOSIS — I5022 Chronic systolic (congestive) heart failure: Secondary | ICD-10-CM | POA: Diagnosis not present

## 2019-12-21 DIAGNOSIS — I429 Cardiomyopathy, unspecified: Secondary | ICD-10-CM | POA: Diagnosis not present

## 2019-12-21 DIAGNOSIS — E1165 Type 2 diabetes mellitus with hyperglycemia: Secondary | ICD-10-CM

## 2019-12-21 DIAGNOSIS — J9621 Acute and chronic respiratory failure with hypoxia: Secondary | ICD-10-CM | POA: Diagnosis not present

## 2019-12-21 DIAGNOSIS — M25562 Pain in left knee: Secondary | ICD-10-CM | POA: Diagnosis not present

## 2019-12-21 DIAGNOSIS — M6281 Muscle weakness (generalized): Secondary | ICD-10-CM | POA: Diagnosis not present

## 2019-12-21 DIAGNOSIS — J439 Emphysema, unspecified: Secondary | ICD-10-CM | POA: Diagnosis not present

## 2019-12-21 LAB — RESPIRATORY PANEL BY RT PCR (FLU A&B, COVID)
Influenza A by PCR: NEGATIVE
Influenza B by PCR: NEGATIVE
SARS Coronavirus 2 by RT PCR: NEGATIVE

## 2019-12-21 LAB — GLUCOSE, CAPILLARY
Glucose-Capillary: 114 mg/dL — ABNORMAL HIGH (ref 70–99)
Glucose-Capillary: 193 mg/dL — ABNORMAL HIGH (ref 70–99)
Glucose-Capillary: 167 mg/dL — ABNORMAL HIGH (ref 70–99)

## 2019-12-21 NOTE — Discharge Summary (Signed)
Physician Discharge Summary  Stacy Grimes JKK:[id number] DOB: 11/06/1950 DOA: 12/15/2019  PCP: Cyndi Bender, PA-C  Admit date: 12/15/2019 Discharge date: 12/21/2019  Admitted From: Home Disposition: SNF  Recommendations for Outpatient Follow-up:  1. Follow ups as below. 2. Please obtain CBC/BMP/Mag at follow up 3. Please follow up on the following pending results: None   Discharge Condition: Stable CODE STATUS: Full code   Contact information for follow-up providers    Cyndi Bender, PA-C. Call in 1 day.   Specialty: Physician Assistant Why: For ER visit follow-up and reevaluation this week. Contact information: [address] [place] [address] [phone]            Contact information for after-discharge care    Destination    [facility] Preferred SNF .   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  She also had pain in her left knee and buttock area.   In ED, mild tachycardia, tachypnea and soft BP.  83% on RA but recovered to 95% on 4 L.  BMP and CBC without significant finding.  Limited RVP nonreactive.  CT head, lumbar spine and hip without acute finding.  DG left femur and tibia and fibula without significant finding.  Troponin 25>> 78.  Twelve-lead EKG without significant finding.  BMP within normal.  UA without significant finding.  Admitted for altered mental status and elevated troponin and left leg pain.   Of note, patient's husband reports fall about 3 weeks ago when "buckled" her left knee.  She has not been able to ambulate well since then.  Also increased confusion from her baseline  since then.  Left knee x-ray with spine fracture.  Orthopedic surgery consulted and recommended nonoperative management with knee injection, WBAT, PT and outpatient follow-up if no improvement.  She had Kenalog injection.   Therapy recommending SNF, and she was discharged to SNF.  See individual problem list below for more on hospital course.  Discharge Diagnoses:  Acute left tibial spine fracture/left knee pain: knee x-ray reveals acute left tibial spine fracture.  CT lumbar spine and hip, and DG left femur and tibia and fibula without acute finding.  She has old L1 compression fracture with one third of weight loss. -Appreciate ortho recs: Kenalog injection (11/12), pain control, WBAT, PT and outpatient follow-up -Pain control with scheduled Tylenol and home gabapentin -Outpatient follow-up with orthopedic surgery in 1 to 2 weeks   Elevated troponin: 25> 78> 84> 73.  Likely demand ischemia from acute hypoxic respiratory failure.  EKG is reassuring.  No cardiopulmonary symptoms.  BNP within normal.  TTE with EF of 35-40% (previously 40 to 45%), global hypokinesis and indeterminate diastolic function.  No cardiopulmonary symptoms. -Continue Home cardiac medications.  Chronic systolic CHF/N ICM s/p CRT-D: TTE as above.  BNP within normal.  No cardiopulmonary symptoms.  Appears euvolemic and compensated.  I&O incomplete. -Continue p.o. Lasix 20 mg daily  -Monitor fluid status. -Recheck renal function in 1 week.  Acute on chronic hypoxic respiratory failure/chronic COPD: Likely from not using home oxygen.On 3 L at baseline.   CXR reassuring.  BNP within  normal.  TTE as above. -Continue supplemental oxygen -Continue home Lasix and inhalers as above.  Essential hypertension: Normotensive. -Continue current regimen  Uncontrolled NIDDM-2 with hyperglycemia: A1c 7.3%. -Continue home Metformin  Dementia with behavioral disturbance: Reportedly found wandering in friend's backyard.   Currently oriented to self and partial place. -Reorientation and delirium precautions. -Avoid or minimize sedating medications.  Hypothyroidism: Stable -Continue home Synthroid.  Chronic gout: reports left knee pain.  Uric acid within normal. -Continue home allopurinol  History of PTSD/depression/anxiety:  stable. -Continue home medications  Debility/physical deconditioning: -Continue PT/OT at SNF.    Body mass index is 35.92 kg/m.            Discharge Exam: Vitals:   12/20/19 2008 12/21/19 0823  BP: 116/65 (!) 151/76  Pulse: 85 87  Resp: 20 19  Temp: 98.7 F (37.1 C) 98.5 F (36.9 C)  SpO2: 96% 97%    GENERAL: No apparent distress.  Nontoxic. HEENT: MMM.  Vision and hearing grossly intact.  NECK: Supple.  No apparent JVD.  RESP:  No IWOB.  Fair aeration bilaterally. CVS:  RRR. Heart sounds normal.  ABD/GI/GU: Bowel sounds present. Soft. Non tender.  MSK/EXT:  Moves extremities. No apparent deformity. No edema.  SKIN: no apparent skin lesion or wound NEURO: Awake, alert and oriented to self and partial place.  No apparent focal neuro deficit. PSYCH: Calm. Normal affect.  Discharge Instructions  Discharge Instructions    (HEART FAILURE PATIENTS) Call MD:  Anytime you have any of the following symptoms: 1) 3 pound weight gain in 24 hours or 5 pounds in 1 week 2) shortness of breath, with or without a dry hacking cough 3) swelling in the hands, feet or stomach 4) if you have to sleep on extra pillows at night in order to breathe.   Complete by: As directed    Call MD for:  difficulty breathing, headache or visual disturbances   Complete by: As directed    Call MD for:  extreme fatigue   Complete by: As directed    Call MD for:  persistant dizziness or light-headedness   Complete by: As directed    Diet - low sodium heart healthy   Complete by: As directed    Diet Carb Modified   Complete by: As directed    Diet Carb Modified   Complete by: As  directed    Discharge instructions   Complete by: As directed    It has been a pleasure taking care of you!  You were hospitalized due to left knee pain and altered mental status.  Orthopedic surgeon recommended nonoperative management of your left knee fracture with pain medication, weightbearing as tolerated and outpatient follow-up in 1 to 2 weeks.  Please follow-up with your primary care doctor and your cardiologist in 1 to 2 weeks.    Take care,   Increase activity slowly   Complete by: As directed      Allergies as of 12/21/2019      Reactions   Codeine Palpitations   Contrast Media [iodinated Diagnostic Agents] Rash      Medication List    TAKE these medications   acetaminophen 500 MG tablet Commonly known as: TYLENOL Take 2 tablets (1,000 mg total) by mouth every 8 (eight) hours as needed for mild pain or moderate pain.   albuterol 0.63 MG/3ML nebulizer solution Commonly known as: ACCUNEB Take 1 ampule by nebulization every 6 (six) hours as needed for wheezing.   albuterol 108 (90 Base)  MCG/ACT inhaler Commonly known as: VENTOLIN HFA Inhale 2 puffs into the lungs every 6 (six) hours as needed.   allopurinol 300 MG tablet Commonly known as: ZYLOPRIM Take 300 mg by mouth 2 (two) times daily.   aspirin 81 MG EC tablet Take 81 mg by mouth daily.   BENGAY EX Apply 1 application topically daily as needed (back pain).   budesonide-formoterol 160-4.5 MCG/ACT inhaler Commonly known as: SYMBICORT Inhale 2 puffs into the lungs 2 (two) times daily.   digoxin 0.125 MG tablet Commonly known as: LANOXIN TAKE 1 TABLET BY MOUTH EVERY 3 DAYS What changed:   how much to take  how to take this  when to take this  additional instructions   donepezil 5 MG tablet Commonly known as: ARICEPT Take 5 mg by mouth at bedtime.   Euthyrox 112 MCG tablet Generic drug: levothyroxine Take 112 mcg by mouth daily before breakfast.   EX-LAX PO Take 2 tablets by mouth at  bedtime as needed (constipation).   furosemide 20 MG tablet Commonly known as: Lasix Take 1 tablet (20 mg total) by mouth daily as needed for fluid or edema.   gabapentin 300 MG capsule Commonly known as: NEURONTIN Take 300 mg by mouth 2 (two) times daily as needed (RLS symptoms/ pain).   hydrOXYzine 25 MG capsule Commonly known as: VISTARIL Take 25 mg by mouth at bedtime as needed for anxiety.   metFORMIN 500 MG 24 hr tablet Commonly known as: GLUCOPHAGE-XR Take 500 mg by mouth 2 (two) times daily with a meal.   metoprolol succinate 25 MG 24 hr tablet Commonly known as: TOPROL-XL Take 25 mg by mouth daily.   omeprazole 20 MG capsule Commonly known as: PRILOSEC Take 1 capsule (20 mg total) by mouth every morning.   oxyCODONE 5 MG immediate release tablet Commonly known as: Oxy IR/ROXICODONE Take 1 tablet (5 mg total) by mouth every 6 (six) hours as needed for up to 5 days for severe pain or breakthrough pain.   tiotropium 18 MCG inhalation capsule Commonly known as: SPIRIVA Place 1 capsule (18 mcg total) into inhaler and inhale daily as needed (shortness of breath).   venlafaxine 75 MG tablet Commonly known as: EFFEXOR Take 75 mg by mouth 2 (two) times daily.   vitamin B-12 1000 MCG tablet Commonly known as: CYANOCOBALAMIN Take 1,000 mcg by mouth 2 (two) times daily.       Consultations:  Orthopedic surgery  Procedures/Studies:  Left knee injection with steroid   DG Chest 1 View  Result Date: 12/15/2019 CLINICAL DATA:  Altered mental status. EXAM: CHEST  1 VIEW COMPARISON:  April 09, 2018. FINDINGS: Stable cardiomediastinal silhouette. Stable left-sided pacemaker. Both lungs are clear. The visualized skeletal structures are unremarkable. IMPRESSION: No active disease. Electronically Signed   By: Marijo Conception M.D.   On: 12/15/2019 08:49   DG Knee 1-2 Views Left  Result Date: 12/16/2019 CLINICAL DATA:  69 year old who fell 2 days ago and injured the LEFT  knee. Subsequent encounter. EXAM: LEFT KNEE - 1-2 VIEW COMPARISON:  None. FINDINGS: Subtle nondisplaced fracture involving the tibial spine. No fractures elsewhere. Small joint effusion/hemarthrosis. IMPRESSION: Subtle nondisplaced fracture involving the tibial spine. Electronically Signed   By: Evangeline Dakin M.D.   On: 12/16/2019 17:01   DG Tibia/Fibula Left  Result Date: 12/15/2019 CLINICAL DATA:  Pain proximal fibular region. EXAM: LEFT TIBIA AND FIBULA - 2 VIEW COMPARISON:  Concurrent femur radiographs. FINDINGS: Normal alignment with approximation of the joints. No  fracture or focal osseous lesion. Mild knee degenerative changes. Soft tissues are unremarkable. IMPRESSION: No acute osseous abnormality.  Mild knee osteoarthrosis. Electronically Signed   By: Primitivo Gauze M.D.   On: 12/15/2019 10:49   CT Head Wo Contrast  Result Date: 12/15/2019 CLINICAL DATA:  Altered mental status EXAM: CT HEAD WITHOUT CONTRAST TECHNIQUE: Contiguous axial images were obtained from the base of the skull through the vertex without intravenous contrast. COMPARISON:  04/09/2018 FINDINGS: Brain: No evidence of acute infarction, hemorrhage, hydrocephalus, extra-axial collection or mass lesion/mass effect. Mild low-density changes within the periventricular and subcortical white matter compatible with chronic microvascular ischemic change. Mild diffuse cerebral volume loss. Vascular: Atherosclerotic calcifications involving the large vessels of the skull base. No unexpected hyperdense vessel. Skull: Normal. Negative for fracture or focal lesion. Sinuses/Orbits: No acute finding. Other: None. IMPRESSION: 1. No acute intracranial findings. 2. Mild chronic microvascular ischemic change and cerebral volume loss. Electronically Signed   By: Davina Poke D.O.   On: 12/15/2019 08:08   CT Lumbar Spine Wo Contrast  Result Date: 12/15/2019 CLINICAL DATA:  Altered mental status.  Back pain. EXAM: CT LUMBAR SPINE  WITHOUT CONTRAST TECHNIQUE: Multidetector CT imaging of the lumbar spine was performed without intravenous contrast administration. Multiplanar CT image reconstructions were also generated. COMPARISON:  Radiography 06/14/2014. FINDINGS: Segmentation: 5 lumbar type vertebral bodies. Alignment: Minimal thoracolumbar curvature convex to the left and lower lumbar curvature convex to the right. 1 mm degenerative anterolisthesis L4-5. Vertebrae: Old compression deformity at L1 with loss of height of 1/3. No change since 2016. No other regional fracture. Paraspinal and other soft tissues: Aortic atherosclerosis. Otherwise negative. Disc levels: Chronic degenerative spondylosis at L2-3. No compressive canal or foraminal stenosis. L3-4: Disc degeneration and minimal bulge but no stenosis. L4-5: Mild bulging of the disc. Bilateral facet osteoarthritis allowing 1 mm of anterolisthesis. Mild stenosis of both lateral recesses and foramina. L5-S1: Mild bulging of the disc. Mild facet osteoarthritis. No apparent compressive stenosis. IMPRESSION: 1. No acute finding. Old compression deformity at L1 with loss of height of 1/3. 2. Lower lumbar degenerative disc disease and degenerative facet disease. Mild stenosis of the lateral recesses and foramina at L4-5 that could cause neural compression. Aortic Atherosclerosis (ICD10-I70.0). Electronically Signed   By: Nelson Chimes M.D.   On: 12/15/2019 13:00   CT PELVIS WO CONTRAST  Result Date: 12/15/2019 CLINICAL DATA:  Limping.  Mental status changes.  Left hip pain. EXAM: CT PELVIS WITHOUT CONTRAST TECHNIQUE: Multidetector CT imaging of the pelvis was performed following the standard protocol without intravenous contrast. COMPARISON:  None. FINDINGS: Urinary Tract:  Bladder appears normal. Bowel: Large amount of fecal matter in the cecum. Diverticulosis of the left colon. No sign of diverticulitis. Previous rectosigmoid anastomosis. Vascular/Lymphatic: Aortic atherosclerosis. No  aneurysm. No adenopathy. Reproductive: Previous hysterectomy. No pelvic mass. Small cysts of the right ovary, none larger than 1.6 cm. Other:  No free fluid or air. Musculoskeletal: No evidence of regional fracture or focal bone pathology. No evidence of joint effusion in either hip joint. No evidence of regional muscular injury. IMPRESSION: 1. No acute finding by CT. No evidence of regional fracture or focal bone pathology. No evidence of joint effusion in either hip joint. No evidence of regional muscular injury. 2. Large amount of fecal matter in the cecum. Diverticulosis of the left colon without evidence of diverticulitis. Previous rectosigmoid anastomosis. 3. Aortic atherosclerosis. Aortic Atherosclerosis (ICD10-I70.0). Electronically Signed   By: Nelson Chimes M.D.   On: 12/15/2019 13:04  ECHOCARDIOGRAM COMPLETE  Result Date: 12/16/2019    ECHOCARDIOGRAM REPORT   Patient Name:   ORIE CUTTINO [facility] Date of Exam: 12/16/2019 Medical Rec #:  [id number]        Height:       63.0 in Accession #:    [id number]       Weight:       201.3 lb Date of Birth:  Jun 03, 1950         BSA:          1.939 m Patient Age:    69 years         BP:           112/58 mmHg Patient Gender: F                HR:           97 bpm. Exam Location:  [facility] Procedure: 2D Echo, Color Doppler, Cardiac Doppler and Intracardiac            Opacification Agent Indications:     Elevated troponin  History:         Patient has prior history of Echocardiogram examinations.                  Cardiomyopathy and HFpEF, Defibrillator, COPD; Risk                  Factors:Sleep Apnea and Hypertension.  Sonographer:     Charmayne Sheer RDCS (AE) Referring Phys:  [id number] Val Riles Diagnosing Phys: Ida Rogue MD  Sonographer Comments: Technically difficult study due to poor echo windows. Image acquisition challenging due to patient body habitus and Image acquisition challenging due to COPD. IMPRESSIONS  1. Left ventricular ejection fraction, by estimation, is 35  to 40%. The left ventricle has moderately decreased function. The left ventricle demonstrates global hypokinesis. Indeterminate diastolic filling due to E-A fusion.  2. Right ventricular systolic function is normal. The right ventricular size is normal. Tricuspid regurgitation signal is inadequate for assessing PA pressure.  3. The mitral valve is normal in structure. No evidence of mitral valve regurgitation. No evidence of mitral stenosis. FINDINGS  Left Ventricle: Left ventricular ejection fraction, by estimation, is 35 to 40%. The left ventricle has moderately decreased function. The left ventricle demonstrates global hypokinesis. Definity contrast agent was given IV to delineate the left ventricular endocardial borders. The left ventricular internal cavity size was normal in size. There is no left ventricular hypertrophy. Indeterminate diastolic filling due to E-A fusion. Right Ventricle: The right ventricular size is normal. No increase in right ventricular wall thickness. Right ventricular systolic function is normal. Tricuspid regurgitation signal is inadequate for assessing PA pressure. Left Atrium: Left atrial size was normal in size. Right Atrium: Right atrial size was normal in size. Pericardium: There is no evidence of pericardial effusion. Mitral Valve: The mitral valve is normal in structure. No evidence of mitral valve regurgitation. No evidence of mitral valve stenosis. MV peak gradient, 7.0 mmHg. The mean mitral valve gradient is 4.0 mmHg. Tricuspid Valve: The tricuspid valve is normal in structure. Tricuspid valve regurgitation is not demonstrated. No evidence of tricuspid stenosis. Aortic Valve: The aortic valve is normal in structure. Aortic valve regurgitation is not visualized. No aortic stenosis is present. Aortic valve mean gradient measures 4.0 mmHg. Aortic valve peak gradient measures 7.6 mmHg. Aortic valve area, by VTI measures 2.70 cm. Pulmonic Valve: The pulmonic valve was normal in  structure. Pulmonic valve regurgitation is not visualized. No  evidence of pulmonic stenosis. Aorta: The aortic root is normal in size and structure. Venous: The inferior vena cava is normal in size with greater than 50% respiratory variability, suggesting right atrial pressure of 3 mmHg. IAS/Shunts: No atrial level shunt detected by color flow Doppler. Additional Comments: A pacer wire is visualized.  LEFT VENTRICLE PLAX 2D LVIDd:         5.38 cm  Diastology LVIDs:         4.29 cm  LV e' lateral:   3.05 cm/s LV PW:         1.03 cm  LV E/e' lateral: 37.7 LV IVS:        0.65 cm LVOT diam:     2.10 cm LV SV:         51 LV SV Index:   26 LVOT Area:     3.46 cm  LEFT ATRIUM           Index LA diam:      3.10 cm 1.60 cm/m LA Vol (A4C): 35.0 ml 18.05 ml/m  AORTIC VALVE                   PULMONIC VALVE AV Area (Vmax):    2.45 cm    PV Vmax:       0.90 m/s AV Area (Vmean):   2.06 cm    PV Vmean:      69.200 cm/s AV Area (VTI):     2.70 cm    PV VTI:        0.172 m AV Vmax:           138.00 cm/s PV Peak grad:  3.2 mmHg AV Vmean:          92.300 cm/s PV Mean grad:  2.0 mmHg AV VTI:            0.187 m AV Peak Grad:      7.6 mmHg AV Mean Grad:      4.0 mmHg LVOT Vmax:         97.50 cm/s LVOT Vmean:        54.800 cm/s LVOT VTI:          0.146 m LVOT/AV VTI ratio: 0.78  AORTA Ao Root diam: 3.00 cm MITRAL VALVE MV Area (PHT): 7.27 cm     SHUNTS MV Peak grad:  7.0 mmHg     Systemic VTI:  0.15 m MV Mean grad:  4.0 mmHg     Systemic Diam: 2.10 cm MV Vmax:       1.32 m/s MV Vmean:      86.8 cm/s MV Decel Time: 104 msec MV E velocity: 115.00 cm/s Ida Rogue MD Electronically signed by Ida Rogue MD Signature Date/Time: 12/16/2019/5:10:16 PM    Final    DG Femur Min 2 Views Left  Result Date: 12/15/2019 CLINICAL DATA:  Left hip pain. EXAM: LEFT FEMUR 2 VIEWS COMPARISON:  None. FINDINGS: There is no evidence of fracture or other focal bone lesions. Soft tissues are unremarkable. IMPRESSION: Negative. Electronically  Signed   By: Marijo Conception M.D.   On: 12/15/2019 08:49       The results of significant diagnostics from this hospitalization (including imaging, microbiology, ancillary and laboratory) are listed below for reference.     Microbiology: Recent Results (from the past 240 hour(s))  Respiratory Panel by RT PCR (Flu A&B, Covid) - Nasopharyngeal Swab     Status: None   Collection Time: 12/15/19  7:22  AM   Specimen: Nasopharyngeal Swab  Result Value Ref Range Status   SARS Coronavirus 2 by RT PCR NEGATIVE NEGATIVE Final    Comment: (NOTE) SARS-CoV-2 target nucleic acids are NOT DETECTED.  The SARS-CoV-2 RNA is generally detectable in upper respiratoy specimens during the acute phase of infection. The lowest concentration of SARS-CoV-2 viral copies this assay can detect is 131 copies/mL. A negative result does not preclude SARS-Cov-2 infection and should not be used as the sole basis for treatment or other patient management decisions. A negative result may occur with  improper specimen collection/handling, submission of specimen other than nasopharyngeal swab, presence of viral mutation(s) within the areas targeted by this assay, and inadequate number of viral copies (<131 copies/mL). A negative result must be combined with clinical observations, patient history, and epidemiological information. The expected result is Negative.  Fact Sheet for Patients:  PinkCheek.be  Fact Sheet for Healthcare Providers:  GravelBags.it  This test is no t yet approved or cleared by the [place] FDA and  has been authorized for detection and/or diagnosis of SARS-CoV-2 by FDA under an Emergency Use Authorization (EUA). This EUA will remain  in effect (meaning this test can be used) for the duration of the COVID-19 declaration under Section 564(b)(1) of the Act, 21 U.S.C. section 360bbb-3(b)(1), unless the authorization is terminated  or revoked sooner.     Influenza A by PCR NEGATIVE NEGATIVE Final   Influenza B by PCR NEGATIVE NEGATIVE Final    Comment: (NOTE) The Xpert Xpress SARS-CoV-2/FLU/RSV assay is intended as an aid in  the diagnosis of influenza from Nasopharyngeal swab specimens and  should not be used as a sole basis for treatment. Nasal washings and  aspirates are unacceptable for Xpert Xpress SARS-CoV-2/FLU/RSV  testing.  Fact Sheet for Patients: PinkCheek.be  Fact Sheet for Healthcare Providers: GravelBags.it  This test is not yet approved or cleared by the [place] FDA and  has been authorized for detection and/or diagnosis of SARS-CoV-2 by  FDA under an Emergency Use Authorization (EUA). This EUA will remain  in effect (meaning this test can be used) for the duration of the  Covid-19 declaration under Section 564(b)(1) of the Act, 21  U.S.C. section 360bbb-3(b)(1), unless the authorization is  terminated or revoked. Performed at [facility], [address], [place], [place] [address]   Urine Culture     Status: None   Collection Time: 12/15/19  9:05 AM   Specimen: Urine, Random  Result Value Ref Range Status   Specimen Description   Final    URINE, RANDOM Performed at [facility], [address], [place], [place] [address]    Special Requests   Final    NONE Performed at [facility], [address] [place], [place] [address]    Culture   Final    NO GROWTH Performed at [facility], [place] [address] [place], [place] [address]    Report Status 12/16/2019 FINAL  Final     Labs: BNP (last 3 results) Recent Labs    12/15/19 0722  BNP 18.8   Basic Metabolic Panel: Recent Labs  Lab 12/15/19 0722 12/18/19 0609  NA 139 140  K 4.0 3.8  CL 97* 96*  CO2 30 34*  GLUCOSE 169* 225*  BUN 17 12  CREATININE 1.12* 0.94  CALCIUM 9.0 8.8*  MG  --  2.2  PHOS  --  2.7   Liver  Function Tests: Recent Labs  Lab 12/18/19  0609  ALBUMIN 2.7*   No results for input(s): LIPASE, AMYLASE in the last 168 hours. No results for input(s): AMMONIA in the last 168 hours. CBC: Recent Labs  Lab 12/15/19 0722 12/18/19 0609  WBC 11.4* 8.6  HGB 10.7* 9.6*  HCT 36.4 32.2*  MCV 89.0 88.0  PLT 366 304   Cardiac Enzymes: No results for input(s): CKTOTAL, CKMB, CKMBINDEX, TROPONINI in the last 168 hours. BNP: Invalid input(s): POCBNP CBG: Recent Labs  Lab 12/20/19 0741 12/20/19 1236 12/20/19 1535 12/20/19 2103 12/21/19 0758  GLUCAP 141* 182* 117* 169* 114*   D-Dimer No results for input(s): DDIMER in the last 72 hours. Hgb A1c No results for input(s): HGBA1C in the last 72 hours. Lipid Profile No results for input(s): CHOL, HDL, LDLCALC, TRIG, CHOLHDL, LDLDIRECT in the last 72 hours. Thyroid function studies No results for input(s): TSH, T4TOTAL, T3FREE, THYROIDAB in the last 72 hours.  Invalid input(s): FREET3 Anemia work up No results for input(s): VITAMINB12, FOLATE, FERRITIN, TIBC, IRON, RETICCTPCT in the last 72 hours. Urinalysis    Component Value Date/Time   COLORURINE YELLOW (A) 12/15/2019 0905   APPEARANCEUR CLOUDY (A) 12/15/2019 0905   APPEARANCEUR Clear 07/31/2013 1658   LABSPEC 1.023 12/15/2019 0905   LABSPEC 1.018 07/31/2013 1658   PHURINE 5.0 12/15/2019 0905   GLUCOSEU 50 (A) 12/15/2019 0905   GLUCOSEU Negative 07/31/2013 1658   HGBUR NEGATIVE 12/15/2019 0905   BILIRUBINUR NEGATIVE 12/15/2019 0905   BILIRUBINUR Negative 07/31/2013 1658   KETONESUR NEGATIVE 12/15/2019 0905   PROTEINUR 100 (A) 12/15/2019 0905   UROBILINOGEN 0.2 03/28/2012 1526   NITRITE NEGATIVE 12/15/2019 0905   LEUKOCYTESUR NEGATIVE 12/15/2019 0905   LEUKOCYTESUR Negative 07/31/2013 1658   Sepsis Labs Invalid input(s): PROCALCITONIN,  WBC,  LACTICIDVEN   Time coordinating discharge: 35 minutes  SIGNED:  Mercy Riding, MD  [facility] 12/21/2019, 10:44  AM  If 7PM-7AM, please contact night-coverage [email or web address]

## 2019-12-21 NOTE — Care Management Important Message (Signed)
Important Message  Patient Details  Name: Stacy Grimes MRN: [id number] Date of Birth: September 01, 1950   Medicare Important Message Given:  Yes     Katyra A Verdell Kincannon 12/21/2019, 10:17 AM

## 2019-12-21 NOTE — Progress Notes (Addendum)
Patient is going to be transported to [facility] via EMS, she will be going to room 710. Called report to [facility] spoke to [place], [employer] Rapid Covid test negative.

## 2019-12-21 NOTE — TOC Transition Note (Addendum)
Transition of Care [facility]) - CM/SW Discharge Note   Patient Details  Name: Stacy Grimes MRN: [id number] Date of Birth: 02-01-1951  [facility] CM/SW Contact:  Magnus Ivan, LCSW Phone Number: 12/21/2019, 1:23 PM   Clinical Narrative:   Patient to discharge to [facility] [place] today, room 710. Rapid COVID test was negative. Patient unvaccinated, [facility] is aware. CSW updated spouse, RN, and MD. RN already called report. Face Sheet and Med Necessity Form placed in Discharge Packet by patient chart. CSW will call EMS when RN is ready.  2:00- [facility] transport arranged for 4:00. Updated Nurse Secretary Jackelyn Poling.    Final next level of care: Skilled Nursing Facility Barriers to Discharge: Barriers Resolved   Patient Goals and CMS Choice Patient states their goals for this hospitalization and ongoing recovery are:: SNF rehab per husband CMS Medicare.gov Compare Post Acute Care list provided to:: Patient Represenative (must comment) Choice offered to / list presented to : Spouse  Discharge Placement              Patient chooses bed at: [facility] [place] Patient to be transferred to facility by: EMS Name of family member notified: [name] Patient and family notified of of transfer: 12/21/19  Discharge Plan and Services In-house Referral: Clinical Social Work   Post Acute Care Choice: Home Health                    HH Arranged: RN, PT, OT, Nurse's Aide [place] Agency: [place] ([place]) Date [place]: 12/16/19 Time [place]: 1544 Representative spoke with at [place]: [place] (SDOH) Interventions     Readmission Risk Interventions No flowsheet data found.

## 2019-12-22 DIAGNOSIS — J9621 Acute and chronic respiratory failure with hypoxia: Secondary | ICD-10-CM | POA: Diagnosis not present

## 2019-12-22 DIAGNOSIS — F0150 Vascular dementia without behavioral disturbance: Secondary | ICD-10-CM | POA: Diagnosis not present

## 2019-12-22 DIAGNOSIS — E669 Obesity, unspecified: Secondary | ICD-10-CM | POA: Diagnosis not present

## 2019-12-22 DIAGNOSIS — I5022 Chronic systolic (congestive) heart failure: Secondary | ICD-10-CM | POA: Diagnosis not present

## 2019-12-22 DIAGNOSIS — M25562 Pain in left knee: Secondary | ICD-10-CM | POA: Diagnosis not present

## 2019-12-22 DIAGNOSIS — E1165 Type 2 diabetes mellitus with hyperglycemia: Secondary | ICD-10-CM | POA: Diagnosis not present

## 2019-12-24 DIAGNOSIS — F0150 Vascular dementia without behavioral disturbance: Secondary | ICD-10-CM | POA: Diagnosis not present

## 2019-12-24 DIAGNOSIS — J9621 Acute and chronic respiratory failure with hypoxia: Secondary | ICD-10-CM | POA: Diagnosis not present

## 2019-12-24 DIAGNOSIS — E1165 Type 2 diabetes mellitus with hyperglycemia: Secondary | ICD-10-CM | POA: Diagnosis not present

## 2019-12-24 DIAGNOSIS — I5022 Chronic systolic (congestive) heart failure: Secondary | ICD-10-CM | POA: Diagnosis not present

## 2019-12-24 DIAGNOSIS — M25562 Pain in left knee: Secondary | ICD-10-CM | POA: Diagnosis not present

## 2019-12-27 DIAGNOSIS — E1165 Type 2 diabetes mellitus with hyperglycemia: Secondary | ICD-10-CM | POA: Diagnosis not present

## 2019-12-27 DIAGNOSIS — J9621 Acute and chronic respiratory failure with hypoxia: Secondary | ICD-10-CM | POA: Diagnosis not present

## 2019-12-27 DIAGNOSIS — I5022 Chronic systolic (congestive) heart failure: Secondary | ICD-10-CM | POA: Diagnosis not present

## 2019-12-27 DIAGNOSIS — F0150 Vascular dementia without behavioral disturbance: Secondary | ICD-10-CM | POA: Diagnosis not present

## 2019-12-27 DIAGNOSIS — M25562 Pain in left knee: Secondary | ICD-10-CM | POA: Diagnosis not present

## 2020-01-02 DIAGNOSIS — F4310 Post-traumatic stress disorder, unspecified: Secondary | ICD-10-CM | POA: Diagnosis not present

## 2020-01-02 DIAGNOSIS — M84462D Pathological fracture, left tibia, subsequent encounter for fracture with routine healing: Secondary | ICD-10-CM | POA: Diagnosis not present

## 2020-01-02 DIAGNOSIS — I110 Hypertensive heart disease with heart failure: Secondary | ICD-10-CM | POA: Diagnosis not present

## 2020-01-02 DIAGNOSIS — I5022 Chronic systolic (congestive) heart failure: Secondary | ICD-10-CM | POA: Diagnosis not present

## 2020-01-02 DIAGNOSIS — Z6832 Body mass index (BMI) 32.0-32.9, adult: Secondary | ICD-10-CM | POA: Diagnosis not present

## 2020-01-02 DIAGNOSIS — I10 Essential (primary) hypertension: Secondary | ICD-10-CM | POA: Diagnosis not present

## 2020-01-02 DIAGNOSIS — E039 Hypothyroidism, unspecified: Secondary | ICD-10-CM | POA: Diagnosis not present

## 2020-01-02 DIAGNOSIS — F32A Depression, unspecified: Secondary | ICD-10-CM | POA: Diagnosis not present

## 2020-01-02 DIAGNOSIS — I255 Ischemic cardiomyopathy: Secondary | ICD-10-CM | POA: Diagnosis not present

## 2020-01-02 DIAGNOSIS — E669 Obesity, unspecified: Secondary | ICD-10-CM | POA: Diagnosis not present

## 2020-01-02 DIAGNOSIS — Z95810 Presence of automatic (implantable) cardiac defibrillator: Secondary | ICD-10-CM | POA: Diagnosis not present

## 2020-01-02 DIAGNOSIS — E1165 Type 2 diabetes mellitus with hyperglycemia: Secondary | ICD-10-CM | POA: Diagnosis not present

## 2020-01-02 DIAGNOSIS — Z9181 History of falling: Secondary | ICD-10-CM | POA: Diagnosis not present

## 2020-01-02 DIAGNOSIS — J9621 Acute and chronic respiratory failure with hypoxia: Secondary | ICD-10-CM | POA: Diagnosis not present

## 2020-01-02 DIAGNOSIS — Z7984 Long term (current) use of oral hypoglycemic drugs: Secondary | ICD-10-CM | POA: Diagnosis not present

## 2020-01-02 DIAGNOSIS — F419 Anxiety disorder, unspecified: Secondary | ICD-10-CM | POA: Diagnosis not present

## 2020-01-02 DIAGNOSIS — Z7982 Long term (current) use of aspirin: Secondary | ICD-10-CM | POA: Diagnosis not present

## 2020-01-02 DIAGNOSIS — M1A00X0 Idiopathic chronic gout, unspecified site, without tophus (tophi): Secondary | ICD-10-CM | POA: Diagnosis not present

## 2020-01-02 DIAGNOSIS — Z9981 Dependence on supplemental oxygen: Secondary | ICD-10-CM | POA: Diagnosis not present

## 2020-01-02 DIAGNOSIS — J449 Chronic obstructive pulmonary disease, unspecified: Secondary | ICD-10-CM | POA: Diagnosis not present

## 2020-01-02 DIAGNOSIS — F0281 Dementia in other diseases classified elsewhere with behavioral disturbance: Secondary | ICD-10-CM | POA: Diagnosis not present

## 2020-01-02 DIAGNOSIS — Z7951 Long term (current) use of inhaled steroids: Secondary | ICD-10-CM | POA: Diagnosis not present

## 2020-01-06 ENCOUNTER — Ambulatory Visit (INDEPENDENT_AMBULATORY_CARE_PROVIDER_SITE_OTHER): Payer: Medicare Other

## 2020-01-06 DIAGNOSIS — I5023 Acute on chronic systolic (congestive) heart failure: Secondary | ICD-10-CM

## 2020-01-06 DIAGNOSIS — I428 Other cardiomyopathies: Secondary | ICD-10-CM

## 2020-01-06 LAB — CUP PACEART REMOTE DEVICE CHECK
Battery Remaining Longevity: 41 mo
Battery Remaining Percentage: 52 %
Battery Voltage: 2.93 V
Brady Statistic AP VP Percent: 1 %
Brady Statistic AP VS Percent: 1 %
Brady Statistic AS VP Percent: 96 %
Brady Statistic AS VS Percent: 3.4 %
Brady Statistic RA Percent Paced: 1 %
Date Time Interrogation Session: 20211202020023
HighPow Impedance: 56 Ohm
HighPow Impedance: 57 Ohm
Implantable Lead Implant Date: 20110309
Implantable Lead Implant Date: 20110309
Implantable Lead Implant Date: 20110309
Implantable Lead Location: 753858
Implantable Lead Location: 753859
Implantable Lead Location: 753860
Implantable Lead Model: 7121
Implantable Pulse Generator Implant Date: 20180814
Lead Channel Impedance Value: 350 Ohm
Lead Channel Impedance Value: 450 Ohm
Lead Channel Impedance Value: 560 Ohm
Lead Channel Pacing Threshold Amplitude: 0.5 V
Lead Channel Pacing Threshold Amplitude: 0.75 V
Lead Channel Pacing Threshold Amplitude: 0.875 V
Lead Channel Pacing Threshold Pulse Width: 0.5 ms
Lead Channel Pacing Threshold Pulse Width: 0.5 ms
Lead Channel Pacing Threshold Pulse Width: 0.5 ms
Lead Channel Sensing Intrinsic Amplitude: 11.7 mV
Lead Channel Sensing Intrinsic Amplitude: 5 mV
Lead Channel Setting Pacing Amplitude: 2 V
Lead Channel Setting Pacing Amplitude: 2.25 V
Lead Channel Setting Pacing Amplitude: 2.5 V
Lead Channel Setting Pacing Pulse Width: 0.5 ms
Lead Channel Setting Pacing Pulse Width: 0.5 ms
Lead Channel Setting Sensing Sensitivity: 0.5 mV
Pulse Gen Serial Number: 9761609

## 2020-01-07 ENCOUNTER — Ambulatory Visit (INDEPENDENT_AMBULATORY_CARE_PROVIDER_SITE_OTHER): Payer: Medicare Other

## 2020-01-07 DIAGNOSIS — J449 Chronic obstructive pulmonary disease, unspecified: Secondary | ICD-10-CM | POA: Diagnosis not present

## 2020-01-07 DIAGNOSIS — I255 Ischemic cardiomyopathy: Secondary | ICD-10-CM | POA: Diagnosis not present

## 2020-01-07 DIAGNOSIS — I5022 Chronic systolic (congestive) heart failure: Secondary | ICD-10-CM | POA: Diagnosis not present

## 2020-01-07 DIAGNOSIS — Z95810 Presence of automatic (implantable) cardiac defibrillator: Secondary | ICD-10-CM | POA: Diagnosis not present

## 2020-01-07 DIAGNOSIS — I110 Hypertensive heart disease with heart failure: Secondary | ICD-10-CM | POA: Diagnosis not present

## 2020-01-07 DIAGNOSIS — M84462D Pathological fracture, left tibia, subsequent encounter for fracture with routine healing: Secondary | ICD-10-CM | POA: Diagnosis not present

## 2020-01-07 DIAGNOSIS — J9621 Acute and chronic respiratory failure with hypoxia: Secondary | ICD-10-CM | POA: Diagnosis not present

## 2020-01-10 ENCOUNTER — Ambulatory Visit: Payer: Medicare Other | Admitting: Hospice and Palliative Medicine

## 2020-01-10 DIAGNOSIS — G8929 Other chronic pain: Secondary | ICD-10-CM | POA: Diagnosis not present

## 2020-01-10 DIAGNOSIS — M8440XA Pathological fracture, unspecified site, initial encounter for fracture: Secondary | ICD-10-CM | POA: Diagnosis not present

## 2020-01-10 DIAGNOSIS — M25562 Pain in left knee: Secondary | ICD-10-CM | POA: Diagnosis not present

## 2020-01-10 DIAGNOSIS — S82115A Nondisplaced fracture of left tibial spine, initial encounter for closed fracture: Secondary | ICD-10-CM | POA: Diagnosis not present

## 2020-01-10 NOTE — Progress Notes (Signed)
EPIC Encounter for ICM Monitoring  Patient Name: Stacy Grimes is a 69 y.o. female Date: 01/10/2020 Primary Care Physican: Cyndi Bender, PA-C Primary [place] Electrophysiologist: Vergie Living Pacing: 96% 8/17/2021Weight:205lbs  AT/AF Burden <1% (taking Aspirin)  Transmission reviewed.  Coruve thoracic impedancesuggesting normal fluid levels.  Prescribed:Furosemide20 mg 1 tabletby mouth as needed for up to 30 days.   Labs: 12/18/2019 Creatinine 0.94, BUN 12, Potassium 3.8, Sodium 140, GFR >60 12/15/2019 Creatinine 1.12, BUN 17, Potassium 4.0, Sodium 139, GFR 53  A complete set of results can be found in Results Review.  Recommendations:No changes  Follow-up plan: ICM clinic phone appointment on1/04/2020.91 day device clinic remote transmission scheduled3/03/2020.  EP/Cardiology Office Visits:01/18/2020 with Dr Fletcher Anon  Last office visit was 04/11/2018 with Ignacia Bayley, NP and 07/16/2017 with Dr Caryl Comes.Canceled apptswith Dr Caryl Comes 02/23/19 &04/20/19. Other appts canceled wereMarch, April.May and July appointments with Dr Fletcher Anon and Gilford Rile, NP.  Copy of ICM check sent to [place].    3 month ICM trend: 01/06/2020    1 Year ICM trend:       [name], RN 01/10/2020 12:38 PM

## 2020-01-11 DIAGNOSIS — J9621 Acute and chronic respiratory failure with hypoxia: Secondary | ICD-10-CM | POA: Diagnosis not present

## 2020-01-11 DIAGNOSIS — I255 Ischemic cardiomyopathy: Secondary | ICD-10-CM | POA: Diagnosis not present

## 2020-01-11 DIAGNOSIS — I110 Hypertensive heart disease with heart failure: Secondary | ICD-10-CM | POA: Diagnosis not present

## 2020-01-11 DIAGNOSIS — M84462D Pathological fracture, left tibia, subsequent encounter for fracture with routine healing: Secondary | ICD-10-CM | POA: Diagnosis not present

## 2020-01-11 DIAGNOSIS — I5022 Chronic systolic (congestive) heart failure: Secondary | ICD-10-CM | POA: Diagnosis not present

## 2020-01-11 DIAGNOSIS — J449 Chronic obstructive pulmonary disease, unspecified: Secondary | ICD-10-CM | POA: Diagnosis not present

## 2020-01-13 NOTE — Progress Notes (Signed)
Remote ICD transmission.   

## 2020-01-17 ENCOUNTER — Telehealth: Payer: Self-pay

## 2020-01-17 NOTE — Telephone Encounter (Signed)
Mailed patient a discharge letter. Non compliant no longer a patient of [facility].

## 2020-01-18 ENCOUNTER — Ambulatory Visit: Payer: Medicare Other | Admitting: Cardiovascular Disease

## 2020-01-18 ENCOUNTER — Telehealth: Payer: Self-pay | Admitting: Cardiovascular Disease

## 2020-01-18 NOTE — Telephone Encounter (Signed)
Spoke to pt's spouse to follow up on symptoms. Pt currently stable and sleeping. Hx of chronic CHF. Spouse reports pt has incr fatigue, felt cool, clammy earlier today. States pt does feel this way occasionally and does have h/o intermittent spells of hot flashes and diaphoresis per Angelica Ran, NP last note.  Denies shortness of breath, chest pain, no incr swelling or any other accompanying symptoms.  Unable to take BP/HR at home. Pt does not take weight daily. Spouse stated weight loss in msg below, states he is referring to her weight from hospital discharge 11/10 ~196 and 1 week ago at PCP at [facility] weight decr to 193. Pt is diabetic, only takes Metformin, does not check FSBS or have device to check at home.  Per med list, pt to take Lasix 20mg  daily PRN, but spouse reports he has been giving her Lasix every morning. Advised pt's spouse I will notify Dr. Fletcher Anon of pt's reason for rescheduling appt today and advised if s/s worsen or becomes unstable to call 911 or take to ER. Spouse verbalized understanding.

## 2020-01-18 NOTE — Telephone Encounter (Signed)
Patient did not feel like coming to appt today.  Rescheduled per spouse but he wants to know what provider thinks about patient symptoms :  Fatigue cold clammy appetite comes and goes significant weight loss   Spouse is not sure if this is related to medications HF or COPD .

## 2020-01-19 NOTE — Telephone Encounter (Signed)
I have not seen this patient before.  I think she is a patient of Dr. Caryl Comes.

## 2020-01-20 DIAGNOSIS — J449 Chronic obstructive pulmonary disease, unspecified: Secondary | ICD-10-CM | POA: Diagnosis not present

## 2020-01-20 DIAGNOSIS — M84462D Pathological fracture, left tibia, subsequent encounter for fracture with routine healing: Secondary | ICD-10-CM | POA: Diagnosis not present

## 2020-01-20 DIAGNOSIS — J9621 Acute and chronic respiratory failure with hypoxia: Secondary | ICD-10-CM | POA: Diagnosis not present

## 2020-01-20 DIAGNOSIS — I255 Ischemic cardiomyopathy: Secondary | ICD-10-CM | POA: Diagnosis not present

## 2020-01-20 DIAGNOSIS — I110 Hypertensive heart disease with heart failure: Secondary | ICD-10-CM | POA: Diagnosis not present

## 2020-01-20 DIAGNOSIS — I5022 Chronic systolic (congestive) heart failure: Secondary | ICD-10-CM | POA: Diagnosis not present

## 2020-01-21 ENCOUNTER — Telehealth: Payer: Self-pay

## 2020-01-21 NOTE — Telephone Encounter (Signed)
Confirmation of verbal order signed by provider and placed in [place] Patient. Patient has been discharged from [facility] contacted patient to advise them next year they will need to find another pulmonologist to sign for their oxygen concentrator and husband picked the phone up and refused to let me speak to the patient in regards to this and upon checking in patients chart I was unable to release any information to him as patient did not have a HIPPA from on file and patients spouse became verbally aggressive to me and I disconnected call.

## 2020-01-31 DIAGNOSIS — J449 Chronic obstructive pulmonary disease, unspecified: Secondary | ICD-10-CM | POA: Diagnosis not present

## 2020-01-31 DIAGNOSIS — J9621 Acute and chronic respiratory failure with hypoxia: Secondary | ICD-10-CM | POA: Diagnosis not present

## 2020-01-31 DIAGNOSIS — M84462D Pathological fracture, left tibia, subsequent encounter for fracture with routine healing: Secondary | ICD-10-CM | POA: Diagnosis not present

## 2020-01-31 DIAGNOSIS — I5022 Chronic systolic (congestive) heart failure: Secondary | ICD-10-CM | POA: Diagnosis not present

## 2020-01-31 DIAGNOSIS — I110 Hypertensive heart disease with heart failure: Secondary | ICD-10-CM | POA: Diagnosis not present

## 2020-01-31 DIAGNOSIS — I255 Ischemic cardiomyopathy: Secondary | ICD-10-CM | POA: Diagnosis not present

## 2020-02-01 DIAGNOSIS — Z6832 Body mass index (BMI) 32.0-32.9, adult: Secondary | ICD-10-CM | POA: Diagnosis not present

## 2020-02-01 DIAGNOSIS — J9621 Acute and chronic respiratory failure with hypoxia: Secondary | ICD-10-CM | POA: Diagnosis not present

## 2020-02-01 DIAGNOSIS — F419 Anxiety disorder, unspecified: Secondary | ICD-10-CM | POA: Diagnosis not present

## 2020-02-01 DIAGNOSIS — F32A Depression, unspecified: Secondary | ICD-10-CM | POA: Diagnosis not present

## 2020-02-01 DIAGNOSIS — E039 Hypothyroidism, unspecified: Secondary | ICD-10-CM | POA: Diagnosis not present

## 2020-02-01 DIAGNOSIS — Z9181 History of falling: Secondary | ICD-10-CM | POA: Diagnosis not present

## 2020-02-01 DIAGNOSIS — Z7951 Long term (current) use of inhaled steroids: Secondary | ICD-10-CM | POA: Diagnosis not present

## 2020-02-01 DIAGNOSIS — E669 Obesity, unspecified: Secondary | ICD-10-CM | POA: Diagnosis not present

## 2020-02-01 DIAGNOSIS — Z7984 Long term (current) use of oral hypoglycemic drugs: Secondary | ICD-10-CM | POA: Diagnosis not present

## 2020-02-01 DIAGNOSIS — M1A00X0 Idiopathic chronic gout, unspecified site, without tophus (tophi): Secondary | ICD-10-CM | POA: Diagnosis not present

## 2020-02-01 DIAGNOSIS — I5022 Chronic systolic (congestive) heart failure: Secondary | ICD-10-CM | POA: Diagnosis not present

## 2020-02-01 DIAGNOSIS — F0281 Dementia in other diseases classified elsewhere with behavioral disturbance: Secondary | ICD-10-CM | POA: Diagnosis not present

## 2020-02-01 DIAGNOSIS — M84462D Pathological fracture, left tibia, subsequent encounter for fracture with routine healing: Secondary | ICD-10-CM | POA: Diagnosis not present

## 2020-02-01 DIAGNOSIS — I110 Hypertensive heart disease with heart failure: Secondary | ICD-10-CM | POA: Diagnosis not present

## 2020-02-01 DIAGNOSIS — F4310 Post-traumatic stress disorder, unspecified: Secondary | ICD-10-CM | POA: Diagnosis not present

## 2020-02-01 DIAGNOSIS — Z7982 Long term (current) use of aspirin: Secondary | ICD-10-CM | POA: Diagnosis not present

## 2020-02-01 DIAGNOSIS — Z9981 Dependence on supplemental oxygen: Secondary | ICD-10-CM | POA: Diagnosis not present

## 2020-02-01 DIAGNOSIS — J449 Chronic obstructive pulmonary disease, unspecified: Secondary | ICD-10-CM | POA: Diagnosis not present

## 2020-02-01 DIAGNOSIS — Z95810 Presence of automatic (implantable) cardiac defibrillator: Secondary | ICD-10-CM | POA: Diagnosis not present

## 2020-02-01 DIAGNOSIS — I255 Ischemic cardiomyopathy: Secondary | ICD-10-CM | POA: Diagnosis not present

## 2020-02-01 DIAGNOSIS — E1165 Type 2 diabetes mellitus with hyperglycemia: Secondary | ICD-10-CM | POA: Diagnosis not present

## 2020-02-07 ENCOUNTER — Ambulatory Visit (INDEPENDENT_AMBULATORY_CARE_PROVIDER_SITE_OTHER): Payer: Medicare Other

## 2020-02-07 DIAGNOSIS — Z95810 Presence of automatic (implantable) cardiac defibrillator: Secondary | ICD-10-CM

## 2020-02-07 DIAGNOSIS — I5022 Chronic systolic (congestive) heart failure: Secondary | ICD-10-CM | POA: Diagnosis not present

## 2020-02-08 NOTE — Progress Notes (Signed)
EPIC Encounter for ICM Monitoring  Patient Name: Stacy Grimes is a 70 y.o. female Date: 02/08/2020 Primary Care Physican: Stacy Bender, PA-C Primary [place] Electrophysiologist: Stacy Grimes Pacing: 96% 8/17/2021Weight:205lbs  AT/AF Burden <1% (taking Aspirin)  Transmission reviewed.  Coruve thoracic impedancenormal fluid levels but was suggesting possible fluid accumulation from 12/15-12/26.  Prescribed:Furosemide20 mg 1 tabletby mouth as needed for up to 30 days.   Labs: 12/18/2019 Creatinine 0.94, BUN 12, Potassium 3.8, Sodium 140, GFR >60 12/15/2019 Creatinine 1.12, BUN 17, Potassium 4.0, Sodium 139, GFR 53  A complete set of results can be found in Results Review.  Recommendations:No changes  Follow-up plan: ICM clinic phone appointment on2/08/2020.91 day device clinic remote transmission scheduled3/03/2020.  EP/Cardiology Office Visits:1/7//2022 with Dr Stacy Grimes .  (Last office visit was 04/11/2018 with Stacy Bayley, NP and 07/16/2017 with Dr Stacy Grimes) Canceled apptswith Dr Stacy Grimes 02/23/19 &04/20/19 and 01/18/2020 with Dr Stacy Grimes. Other appts canceled in 2021 wereMarch, April.May and July appointments with Dr Stacy Grimes and Stacy Rile, NP.  Copy of ICM check sent to Stacy Grimes.    3 month ICM trend: 02/07/2020.    1 Year ICM trend:       Stacy Billings, RN 02/08/2020 5:02 PM

## 2020-02-11 ENCOUNTER — Ambulatory Visit: Payer: Medicare Other | Admitting: Cardiovascular Disease

## 2020-03-09 ENCOUNTER — Ambulatory Visit: Payer: Medicare Other | Admitting: Cardiovascular Disease

## 2020-03-13 ENCOUNTER — Ambulatory Visit (INDEPENDENT_AMBULATORY_CARE_PROVIDER_SITE_OTHER): Payer: Medicare Other

## 2020-03-13 DIAGNOSIS — I5022 Chronic systolic (congestive) heart failure: Secondary | ICD-10-CM | POA: Diagnosis not present

## 2020-03-13 DIAGNOSIS — Z95810 Presence of automatic (implantable) cardiac defibrillator: Secondary | ICD-10-CM

## 2020-03-15 NOTE — Progress Notes (Signed)
EPIC Encounter for ICM Monitoring  Patient Name: Stacy Grimes is a 70 y.o. female Date: 03/15/2020 Primary Care Physican: Cyndi Bender, PA-C Primary [place] Electrophysiologist: Vergie Living Pacing: 96% 2/9/2022Weight:205lbs  AT/AF Burden <1% (taking Aspirin)  Spoke with husband per DPR.  He reports patient is doing well and no fluid symptoms.    Coruve thoracic impedancesuggesting possible fluid accumulation starting 03/08/2020 but improving.  Prescribed:Furosemide20 mg 1 tabletby mouth as needed for up to 30 days.   Labs: 12/18/2019 Creatinine0.94, BUN12, Potassium3.8, Sodium140, GFR>60 12/15/2019 Creatinine1.12, BUN17, Potassium4.0, Sodium139, [id number] A complete set of results can be found in Results Review.  Recommendations:Advised to take 1 extra Furosemide tablet x 1 day.  Follow-up plan: ICM clinic phone appointment on2/17/2022 to recheck fluid levels.91 day device clinic remote transmission scheduled3/03/2020.  EP/Cardiology Office Visits:03/16/2020 with Dr Fletcher Anon.  (Last office visit was 04/11/2018 with Ignacia Bayley, NP and 07/16/2017 with Dr Caryl Comes) Canceled apptswith Dr Caryl Comes 02/23/19 &04/20/19. Also 01/18/2020, 02/11/2020, 03/09/2020 with Dr Fletcher Anon. Other appts canceled in 2021 wereMarch, April.May and July appointments with Dr Fletcher Anon and Gilford Rile, NP.  Copy of ICM check sent to Dr.Klein and Dr Fletcher Anon.   3 month ICM trend: 03/13/2020.    1 Year ICM trend:       [name], RN 03/15/2020 8:30 AM

## 2020-03-16 ENCOUNTER — Other Ambulatory Visit: Payer: Self-pay

## 2020-03-16 ENCOUNTER — Ambulatory Visit (INDEPENDENT_AMBULATORY_CARE_PROVIDER_SITE_OTHER): Payer: Medicare Other | Admitting: Cardiovascular Disease

## 2020-03-16 ENCOUNTER — Encounter: Payer: Self-pay | Admitting: Cardiovascular Disease

## 2020-03-16 VITALS — BP 124/82 | HR 93 | Ht 63.0 in | Wt 191.0 lb

## 2020-03-16 DIAGNOSIS — I428 Other cardiomyopathies: Secondary | ICD-10-CM | POA: Diagnosis not present

## 2020-03-16 DIAGNOSIS — I5022 Chronic systolic (congestive) heart failure: Secondary | ICD-10-CM

## 2020-03-16 NOTE — Patient Instructions (Signed)

## 2020-03-16 NOTE — Progress Notes (Signed)
Cardiology Office Note   Date:  03/16/2020   ID:  Stacy Grimes, DOB 09-18-1950, MRN [id number]  PCP:  Cyndi Bender, PA-C  Cardiologist:   Kathlyn Sacramento, MD   Chief Complaint  Patient presents with  . Follow-up    overdue      History of Present Illness: Stacy Grimes is a 70 y.o. female who presents to reestablish cardiovascular care.  I saw her in my previous practice at [place] in 2010. She has known history of orthostatic hypotension, chronic systolic heart failure due to nonischemic cardiomyopathy status post ICD placement by Dr. Caryl Comes in 2018, COPD on home oxygen, essential hypertension, abdominal aortic aneurysm, hypothyroidism, mild dementia, remote tobacco use and sleep apnea. She had previous cardiac catheterization in 2010 which showed an EF of 15 to 20% with no obstructive disease.  She had improvement in LV systolic function to the low normal range. She was hospitalized in 2020 due to syncope.  She was noted to be hypotensive.  The episode was felt to be due to dehydration which improved after holding home meds including diuretics and lisinopril. She was most recently seen in our office by Ignacia Bayley in March 2020.  She was noted to had recurrent volume overload and thus furosemide was resumed at a lower dose of 20 mg once daily. She has been doing reasonably well denies chest pain.  History is somewhat limited given her dementia and she does not complain much.  Her husband does most of the talking.  She has not been on ACE inhibitor or ARB due to his previous episode of severe hypotension and acute renal failure.  She is currently on small dose metoprolol as well as digoxin 3 times weekly.  She has severe COPD and uses oxygen 3 to 4 L.  Past Medical History:  Diagnosis Date  . AAA (abdominal aortic aneurysm) ([place])   . Anginal pain ([place])   . Anxiety   . Arthritis   . Automatic implantable cardioverter-defibrillator in situ   . Biventricular ICD  [employer]   . COPD (chronic obstructive pulmonary disease) ([place])   . Deafness    left ear  . Dementia ([place])   . Depression   . Dizziness   . Dysrhythmia   . Fracture    history of spinal fracture  . GERD (gastroesophageal reflux disease)   . HFrEF (heart failure with reduced ejection fraction) ([place])    a. 2010 Cath: nonobs dzs, EF 15-20%; b. 09/2017 Echo: EF suboptimal. EF low nl; c. 04/2018 Echo: EF 40-45%.  . Hypertension   . Hypothyroidism   . Non-ischemic cardiomyopathy ([place])    a. 2010 Cath: nonobs dzs, EF 15-20%; b. 2011 s/p SJM CRT-D; c. 09/2016 s/p Gen change; d. 09/2017 Echo: EF suboptimal. EF low nl; e. 04/2018 Echo: EF 40-45%.  . Oxygen dependent   . Palpitations   . Shortness of breath dyspnea   . Sleep apnea   . Wheezing     Past Surgical History:  Procedure Laterality Date  . ABDOMINAL HYSTERECTOMY    . BIV ICD GENERATOR CHANGEOUT N/A 09/17/2016   Procedure: BiV ICD Generator Changeout;  Surgeon: Deboraha Sprang, MD;  Location: [facility];  Service: Cardiovascular;  Laterality: N/A;  . BLADDER SURGERY    . CARDIAC CATHETERIZATION    . CARDIAC DEFIBRILLATOR PLACEMENT  4 yrs ago   pacemaker  . CATARACT EXTRACTION W/PHACO Left 09/01/2014   Procedure: CATARACT EXTRACTION PHACO AND INTRAOCULAR  LENS PLACEMENT (IOC);  Surgeon: Lyla Glassing, MD;  Location: [facility];  Service: Ophthalmology;  Laterality: Left;  [place]: 1:12.1   . CATARACT EXTRACTION W/PHACO Right 09/29/2014   Procedure: CATARACT EXTRACTION PHACO AND INTRAOCULAR LENS PLACEMENT (IOC);  Surgeon: Lyla Glassing, MD;  Location: [facility];  Service: Ophthalmology;  Laterality: Right;  [place]: 00:52.7   . CHOLECYSTECTOMY    . COLON SURGERY    . COLONOSCOPY    . COLONOSCOPY N/A 08/31/2012   Procedure: COLONOSCOPY;  Surgeon: Inda Castle, MD;  Location: [facility];  Service: Endoscopy;  Laterality: N/A;  . ESOPHAGOGASTRODUODENOSCOPY N/A 07/06/2012   Procedure: ESOPHAGOGASTRODUODENOSCOPY (EGD);  Surgeon: Lafayette Dragon, MD;  Location: [facility];  Service: Endoscopy;  Laterality: N/A;  . KNEE SURGERY Right   . MIDDLE EAR SURGERY    . SAVORY DILATION N/A 07/06/2012   Procedure: SAVORY DILATION;  Surgeon: Lafayette Dragon, MD;  Location: [facility];  Service: Endoscopy;  Laterality: N/A;  . WRIST SURGERY Right      Current Outpatient Medications  Medication Sig Dispense Refill  . acetaminophen (TYLENOL) 500 MG tablet Take 2 tablets (1,000 mg total) by mouth every 8 (eight) hours as needed for mild pain or moderate pain. 40 tablet 0  . albuterol (ACCUNEB) 0.63 MG/3ML nebulizer solution Take 1 ampule by nebulization every 6 (six) hours as needed for wheezing.    Marland Kitchen albuterol (PROVENTIL HFA;VENTOLIN HFA) 108 (90 Base) MCG/ACT inhaler Inhale 2 puffs into the lungs every 6 (six) hours as needed. 1 Inhaler 0  . allopurinol (ZYLOPRIM) 300 MG tablet Take 300 mg by mouth 2 (two) times daily.    Marland Kitchen aspirin 81 MG EC tablet Take 81 mg by mouth daily.    . budesonide-formoterol (SYMBICORT) 160-4.5 MCG/ACT inhaler Inhale 2 puffs into the lungs 2 (two) times daily.    . digoxin (LANOXIN) 0.125 MG tablet TAKE 1 TABLET BY MOUTH EVERY 3 DAYS (Patient taking differently: Take 0.125 mg by mouth every Monday, Wednesday, and Friday.) 15 tablet 0  . donepezil (ARICEPT) 5 MG tablet Take 5 mg by mouth at bedtime.    . gabapentin (NEURONTIN) 300 MG capsule Take 300 mg by mouth 2 (two) times daily as needed (RLS symptoms/ pain).     . hydrOXYzine (VISTARIL) 25 MG capsule Take 25 mg by mouth at bedtime as needed for anxiety.    Marland Kitchen levothyroxine (SYNTHROID) 112 MCG tablet Take 112 mcg by mouth daily before breakfast.    . Menthol, Topical Analgesic, (BENGAY EX) Apply 1 application topically daily as needed (back pain).    . metFORMIN (GLUCOPHAGE-XR) 500 MG 24 hr tablet Take 500 mg by mouth 2 (two) times daily with a meal.     . metoprolol succinate (TOPROL-XL) 25 MG 24 hr tablet Take 25 mg by mouth daily.    Marland Kitchen omeprazole (PRILOSEC)  20 MG capsule Take 1 capsule (20 mg total) by mouth every morning. 30 capsule 2  . Sennosides (EX-LAX PO) Take 2 tablets by mouth at bedtime as needed (constipation).    Marland Kitchen tiotropium (SPIRIVA) 18 MCG inhalation capsule Place 1 capsule (18 mcg total) into inhaler and inhale daily as needed (shortness of breath). (Patient taking differently: Place 18 mcg into inhaler and inhale daily as needed (shortness of breath).) 30 capsule 12  . venlafaxine (EFFEXOR) 75 MG tablet Take 75 mg by mouth 2 (two) times daily.    . vitamin B-12 (CYANOCOBALAMIN) 1000 MCG tablet Take 1,000 mcg by mouth 2 (two) times daily.     Marland Kitchen  furosemide (LASIX) 20 MG tablet Take 1 tablet (20 mg total) by mouth daily as needed for fluid or edema. 30 tablet 1   No current facility-administered medications for this visit.    Allergies:   Codeine and Contrast media [iodinated diagnostic agents]    Social History:  The patient  reports that she quit smoking about 18 years ago. Her smoking use included cigarettes. She has a 60.00 pack-year smoking history. She has never used smokeless tobacco. She reports that she does not drink alcohol and does not use drugs.   Family History:  The patient's family history includes Breast cancer in her mother; Colon cancer (age of onset: 43) in her brother; Heart attack in her sister and sister; Heart disease in her father; Hypertension in her brother and mother; Stroke in her mother.    ROS:  Please see the history of present illness.   Otherwise, review of systems are positive for none.   All other systems are reviewed and negative.    PHYSICAL EXAM: VS:  BP 124/82   Pulse 93   Ht 5\' 3"  (1.6 m)   Wt 191 lb (86.6 kg)   BMI 33.83 kg/m  , BMI Body mass index is 33.83 kg/m. GEN: Well nourished, well developed, in no acute distress  HEENT: normal  Neck: no JVD, carotid bruits, or masses Cardiac: RRR; no murmurs, rubs, or gallops,no edema  Respiratory:  clear to auscultation bilaterally, normal  work of breathing GI: soft, nontender, nondistended, + BS MS: no deformity or atrophy  Skin: warm and dry, no rash Neuro:  Strength and sensation are intact Psych: euthymic mood, full affect   EKG:  EKG is not ordered today. EKG from November was reviewed and showed sinus tachycardia with low voltage.   Recent Labs: 12/15/2019: B Natriuretic Peptide 82.4 12/18/2019: BUN 12; Creatinine, Ser 0.94; Hemoglobin 9.6; Magnesium 2.2; Platelets 304; Potassium 3.8; Sodium 140    Lipid Panel    Component Value Date/Time   CHOL 236 (H) 03/29/2012 0341   TRIG 150 (H) 03/29/2012 0341   HDL 65 03/29/2012 0341   CHOLHDL 3.6 03/29/2012 0341   VLDL 30 03/29/2012 0341   LDLCALC 141 (H) 03/29/2012 0341      Wt Readings from Last 3 Encounters:  03/16/20 191 lb (86.6 kg)  12/17/19 202 lb 12.8 oz (92 kg)  09/01/19 (!) 203 lb 6.4 oz (92.3 kg)        No flowsheet data found.    ASSESSMENT AND PLAN:  1.  Chronic systolic heart failure: This is due to nonischemic cardiomyopathy.  Currently New York Heart Association class II.  No significant volume overload and she continues to be on small dose furosemide 20 mg daily.  I think at some point we might have to consider resuming a small dose ARB with cautious monitoring of blood pressure and renal function.  I doubt that she will tolerate Entresto.  Most recent echocardiogram in November showed an EF of 35 to 40%.  2.  Orthostatic hypotension: This has limited advancement of her heart failure medications.  3.  Severe COPD: On home oxygen.  4.  Dementia, seems to be stable overall.  5.  Status post ICD placement: Followed by Dr. Caryl Comes.    Disposition:   FU with me in 6 months  Signed,  Kathlyn Sacramento, MD  03/16/2020 4:46 PM    [place]

## 2020-03-23 ENCOUNTER — Ambulatory Visit (INDEPENDENT_AMBULATORY_CARE_PROVIDER_SITE_OTHER): Payer: Medicare Other

## 2020-03-23 DIAGNOSIS — Z95810 Presence of automatic (implantable) cardiac defibrillator: Secondary | ICD-10-CM

## 2020-03-23 DIAGNOSIS — I5022 Chronic systolic (congestive) heart failure: Secondary | ICD-10-CM

## 2020-03-29 NOTE — Progress Notes (Signed)
EPIC Encounter for ICM Monitoring  Patient Name: Stacy Grimes is a 70 y.o. female Date: 03/29/2020 Primary Care Physican: Cyndi Bender, PA-C Primary [place] Electrophysiologist: Caryl Comes Bi-V Pacing: 96% 2/9/2022Weight:205lbs  AT/AF Burden <1% (taking Aspirin)  Transmission reviewed.  Coruve thoracic impedancesuggesting fluid levels returned to normal after taking extra Furosemide.   Prescribed:Furosemide20 mg 1 tabletby mouth as needed for up to 30 days.   Labs: 12/18/2019 Creatinine0.94, BUN12, Potassium3.8, Sodium140, GFR>60 12/15/2019 Creatinine1.12, BUN17, Potassium4.0, Sodium139, [id number] A complete set of results can be found in Results Review.  Recommendations:No changes  Follow-up plan: ICM clinic phone appointment on3/28/2022.91 day device clinic remote transmission scheduled3/03/2020.  EP/Cardiology Office Visits:Office visit with Dr Caryl Comes needed.  Copy of ICM check sent to [place]   3 month ICM trend: 03/23/2020.    1 Year ICM trend:       [name], RN 03/29/2020 4:30 PM

## 2020-04-06 ENCOUNTER — Ambulatory Visit (INDEPENDENT_AMBULATORY_CARE_PROVIDER_SITE_OTHER): Payer: Medicare Other

## 2020-04-06 DIAGNOSIS — I420 Dilated cardiomyopathy: Secondary | ICD-10-CM

## 2020-04-10 LAB — CUP PACEART REMOTE DEVICE CHECK
Battery Remaining Longevity: 37 mo
Battery Remaining Percentage: 49 %
Battery Voltage: 2.93 V
Brady Statistic AP VP Percent: 1 %
Brady Statistic AP VS Percent: 1 %
Brady Statistic AS VP Percent: 96 %
Brady Statistic AS VS Percent: 3.5 %
Brady Statistic RA Percent Paced: 1 %
Date Time Interrogation Session: 20220303020016
HighPow Impedance: 54 Ohm
HighPow Impedance: 55 Ohm
Implantable Lead Implant Date: 20110309
Implantable Lead Implant Date: 20110309
Implantable Lead Implant Date: 20110309
Implantable Lead Location: 753858
Implantable Lead Location: 753859
Implantable Lead Location: 753860
Implantable Lead Model: 7121
Implantable Pulse Generator Implant Date: 20180814
Lead Channel Impedance Value: 330 Ohm
Lead Channel Impedance Value: 490 Ohm
Lead Channel Impedance Value: 610 Ohm
Lead Channel Pacing Threshold Amplitude: 0.5 V
Lead Channel Pacing Threshold Amplitude: 0.75 V
Lead Channel Pacing Threshold Amplitude: 1.125 V
Lead Channel Pacing Threshold Pulse Width: 0.5 ms
Lead Channel Pacing Threshold Pulse Width: 0.5 ms
Lead Channel Pacing Threshold Pulse Width: 0.5 ms
Lead Channel Sensing Intrinsic Amplitude: 11.7 mV
Lead Channel Sensing Intrinsic Amplitude: 4.1 mV
Lead Channel Setting Pacing Amplitude: 2 V
Lead Channel Setting Pacing Amplitude: 2.25 V
Lead Channel Setting Pacing Amplitude: 2.5 V
Lead Channel Setting Pacing Pulse Width: 0.5 ms
Lead Channel Setting Pacing Pulse Width: 0.5 ms
Lead Channel Setting Sensing Sensitivity: 0.5 mV
Pulse Gen Serial Number: 9761609

## 2020-04-17 NOTE — Progress Notes (Signed)
Remote ICD transmission.   

## 2020-05-01 ENCOUNTER — Ambulatory Visit (INDEPENDENT_AMBULATORY_CARE_PROVIDER_SITE_OTHER): Payer: Medicare Other

## 2020-05-01 DIAGNOSIS — I5022 Chronic systolic (congestive) heart failure: Secondary | ICD-10-CM | POA: Diagnosis not present

## 2020-05-01 DIAGNOSIS — Z95810 Presence of automatic (implantable) cardiac defibrillator: Secondary | ICD-10-CM | POA: Diagnosis not present

## 2020-05-02 NOTE — Progress Notes (Signed)
EPIC Encounter for ICM Monitoring  Patient Name: Stacy Grimes is a 70 y.o. female Date: 05/02/2020 Primary Care Physican: Cyndi Bender, PA-C Primary [place] Electrophysiologist: Vergie Living Pacing: 96% 2/9/2022Weight:205lbs  AT/AF Burden <1% (taking Aspirin)  Spoke with patient and reports feeling well at this time.  Denies fluid symptoms.    Coruve thoracic impedancesuggesting possible fluid accumulation starting 04/29/2020 and starting to trend toward baseline.   Prescribed:Furosemide20 mg 1 tabletby mouth as needed for up to 30 days.   Labs: 12/18/2019 Creatinine0.94, BUN12, Potassium3.8, Sodium140, GFR>60 12/15/2019 Creatinine1.12, BUN17, Potassium4.0, Sodium139, [id number] A complete set of results can be found in Results Review.  Recommendations: Advised to take extra Furosemide 1 tablet x 1 day and return to prescribed dosage.   Follow-up plan: ICM clinic phone appointment on4/07/2020 to recheck fluid levels (no call back needed if normal).91 day device clinic remote transmission scheduled3/03/2020.  EP/Cardiology Office Visits:Advised to call office to schedule over due visit with Dr Suzzanne Cloud OV with Dr Caryl Comes was 07/16/2017)  Copy of ICM check sent to Dr.Klein.   3 month ICM trend: 05/01/2020.    1 Year ICM trend:       [name], RN 05/02/2020 8:48 AM

## 2020-05-10 ENCOUNTER — Ambulatory Visit (INDEPENDENT_AMBULATORY_CARE_PROVIDER_SITE_OTHER): Payer: Medicare Other

## 2020-05-10 DIAGNOSIS — I5022 Chronic systolic (congestive) heart failure: Secondary | ICD-10-CM

## 2020-05-10 DIAGNOSIS — Z95810 Presence of automatic (implantable) cardiac defibrillator: Secondary | ICD-10-CM

## 2020-05-10 NOTE — Progress Notes (Signed)
EPIC Encounter for ICM Monitoring  Patient Name: Stacy Grimes is a 70 y.o. female Date: 05/10/2020 Primary Care Physican: Cyndi Bender, PA-C Primary [place] Electrophysiologist: Vergie Living Pacing: 96% 2/9/2022Weight:205lbs  AT/AF Burden <1% (taking Aspirin)  Transmission reviewed.     Coruve thoracic impedancesuggesting fluid levels returned to normal after taking extra Furosemide.  Prescribed:Furosemide20 mg 1 tabletby mouth as needed for up to 30 days.   Labs: 12/18/2019 Creatinine0.94, BUN12, Potassium3.8, Sodium140, GFR>60 12/15/2019 Creatinine1.12, BUN17, Potassium4.0, Sodium139, [id number] A complete set of results can be found in Results Review.  Recommendations: No changes  Follow-up plan: ICM clinic phone appointment on5/03/2020.91 day device clinic remote transmission scheduled6/03/2020.  EP/Cardiology Office Visits:  09/05/2020 with Dr Fletcher Anon.  Spouse aware he needs to call office to schedule over due visit with Dr Suzzanne Cloud OV with Dr Caryl Comes was 07/16/2017)  Copy of ICM check sent to Dr.Klein.  3 month ICM trend: 05/08/2020.    1 Year ICM trend:       Stacy Billings, RN 05/10/2020 9:06 AM

## 2020-06-05 ENCOUNTER — Ambulatory Visit (INDEPENDENT_AMBULATORY_CARE_PROVIDER_SITE_OTHER): Payer: Medicare Other

## 2020-06-05 DIAGNOSIS — I5022 Chronic systolic (congestive) heart failure: Secondary | ICD-10-CM

## 2020-06-05 DIAGNOSIS — Z95810 Presence of automatic (implantable) cardiac defibrillator: Secondary | ICD-10-CM | POA: Diagnosis not present

## 2020-06-05 NOTE — Progress Notes (Signed)
EPIC Encounter for ICM Monitoring  Patient Name: Stacy Grimes is a 70 y.o. female Date: 06/05/2020 Primary Care Physican: Cyndi Bender, PA-C Primary [place] Electrophysiologist: Caryl Comes Bi-V Pacing: 96% 2/10/2022Weight:191lbs  AT/AF Burden <1% (taking Aspirin)  Transmission reviewed.    Coruve thoracic impedancesuggesting normal fluid levels.  Prescribed:Furosemide20 mg 1 tabletby mouth as needed for up to 30 days.   Labs: 12/18/2019 Creatinine0.94, BUN12, Potassium3.8, Sodium140, GFR>60 12/15/2019 Creatinine1.12, BUN17, Potassium4.0, Sodium139, [id number] A complete set of results can be found in Results Review.  Recommendations: No changes  Follow-up plan: ICM clinic phone appointment on6/13/2022.91 day device clinic remote transmission scheduled6/03/2020.  EP/Cardiology Office Visits:  09/05/2020 with Dr Fletcher Anon.  Spouse aware he needs to call officeto schedule over duevisit with Dr Jenean Lindau with Dr Caryl Comes was 07/16/2017)  Copy of ICM check sent to [place].   3 month ICM trend: 06/05/2020.    1 Year ICM trend:       [name], RN 06/05/2020 3:24 PM

## 2020-06-19 ENCOUNTER — Telehealth: Payer: Self-pay | Admitting: Cardiovascular Disease

## 2020-06-19 MED ORDER — DIGOXIN 125 MCG PO TABS
0.1250 mg | ORAL_TABLET | ORAL | 0 refills | Status: DC
Start: 2020-06-19 — End: 2020-09-18

## 2020-06-19 MED ORDER — FUROSEMIDE 20 MG PO TABS: 20.0000 mg | ORAL_TABLET | Freq: Every day | ORAL | 0 refills | Status: DC | PRN

## 2020-06-19 NOTE — Telephone Encounter (Signed)
Requested Prescriptions   Signed Prescriptions Disp Refills   digoxin (LANOXIN) 0.125 MG tablet 36 tablet 0    Sig: Take 1 tablet (0.125 mg total) by mouth every Monday, Wednesday, and Friday.    Authorizing Provider: Kathlyn Sacramento A    Ordering User: Raelene Bott, Vinetta Brach L   furosemide (LASIX) 20 MG tablet 90 tablet 0    Sig: Take 1 tablet (20 mg total) by mouth daily as needed for fluid or edema.    Authorizing Provider: Kathlyn Sacramento A    Ordering User: Raelene Bott, Alfred Harrel L

## 2020-06-19 NOTE — Telephone Encounter (Signed)
*  STAT* If patient is at the pharmacy, call can be transferred to refill team.   1. Which medications need to be refilled? (please list name of each medication and dose if known) Furosemide 20mg , Digoxin 125mg   2. Which pharmacy/location (including street and city if local pharmacy) is medication to be sent to? [place], [place] [employer]  3. Do they need a 30 day or 90 day supply? 90 day

## 2020-06-20 ENCOUNTER — Emergency Department: Payer: Medicare Other

## 2020-06-20 ENCOUNTER — Inpatient Hospital Stay
Admission: EM | Admit: 2020-06-20 | Discharge: 2020-06-23 | DRG: 062 | Disposition: A | Discharge status: Home / Self Care | Payer: Medicare Other | Attending: Internal Medicine | Admitting: Internal Medicine

## 2020-06-20 DIAGNOSIS — Z95810 Presence of automatic (implantable) cardiac defibrillator: Secondary | ICD-10-CM

## 2020-06-20 DIAGNOSIS — Z8249 Family history of ischemic heart disease and other diseases of the circulatory system: Secondary | ICD-10-CM

## 2020-06-20 DIAGNOSIS — J441 Chronic obstructive pulmonary disease with (acute) exacerbation: Secondary | ICD-10-CM | POA: Diagnosis present

## 2020-06-20 DIAGNOSIS — M1990 Unspecified osteoarthritis, unspecified site: Secondary | ICD-10-CM | POA: Diagnosis present

## 2020-06-20 DIAGNOSIS — I471 Supraventricular tachycardia: Secondary | ICD-10-CM | POA: Diagnosis present

## 2020-06-20 DIAGNOSIS — G8194 Hemiplegia, unspecified affecting left nondominant side: Secondary | ICD-10-CM | POA: Diagnosis present

## 2020-06-20 DIAGNOSIS — I4891 Unspecified atrial fibrillation: Secondary | ICD-10-CM

## 2020-06-20 DIAGNOSIS — Z79890 Hormone replacement therapy (postmenopausal): Secondary | ICD-10-CM

## 2020-06-20 DIAGNOSIS — Z8679 Personal history of other diseases of the circulatory system: Secondary | ICD-10-CM

## 2020-06-20 DIAGNOSIS — Z7984 Long term (current) use of oral hypoglycemic drugs: Secondary | ICD-10-CM

## 2020-06-20 DIAGNOSIS — R000 Tachycardia, unspecified: Secondary | ICD-10-CM | POA: Diagnosis not present

## 2020-06-20 DIAGNOSIS — R0689 Other abnormalities of breathing: Secondary | ICD-10-CM | POA: Diagnosis not present

## 2020-06-20 DIAGNOSIS — I2510 Atherosclerotic heart disease of native coronary artery without angina pectoris: Secondary | ICD-10-CM | POA: Diagnosis present

## 2020-06-20 DIAGNOSIS — R0902 Hypoxemia: Secondary | ICD-10-CM | POA: Diagnosis not present

## 2020-06-20 DIAGNOSIS — R4781 Slurred speech: Secondary | ICD-10-CM | POA: Diagnosis not present

## 2020-06-20 DIAGNOSIS — Z885 Allergy status to narcotic agent status: Secondary | ICD-10-CM

## 2020-06-20 DIAGNOSIS — Z9981 Dependence on supplemental oxygen: Secondary | ICD-10-CM

## 2020-06-20 DIAGNOSIS — R29707 NIHSS score 7: Secondary | ICD-10-CM | POA: Diagnosis present

## 2020-06-20 DIAGNOSIS — Z87891 Personal history of nicotine dependence: Secondary | ICD-10-CM

## 2020-06-20 DIAGNOSIS — Z823 Family history of stroke: Secondary | ICD-10-CM

## 2020-06-20 DIAGNOSIS — Z20822 Contact with and (suspected) exposure to covid-19: Secondary | ICD-10-CM | POA: Diagnosis not present

## 2020-06-20 DIAGNOSIS — F419 Anxiety disorder, unspecified: Secondary | ICD-10-CM | POA: Diagnosis present

## 2020-06-20 DIAGNOSIS — Z79899 Other long term (current) drug therapy: Secondary | ICD-10-CM

## 2020-06-20 DIAGNOSIS — I428 Other cardiomyopathies: Secondary | ICD-10-CM | POA: Diagnosis present

## 2020-06-20 DIAGNOSIS — I482 Chronic atrial fibrillation, unspecified: Secondary | ICD-10-CM | POA: Diagnosis present

## 2020-06-20 DIAGNOSIS — I5022 Chronic systolic (congestive) heart failure: Secondary | ICD-10-CM | POA: Diagnosis not present

## 2020-06-20 DIAGNOSIS — I639 Cerebral infarction, unspecified: Secondary | ICD-10-CM | POA: Diagnosis not present

## 2020-06-20 DIAGNOSIS — R29810 Facial weakness: Secondary | ICD-10-CM | POA: Diagnosis not present

## 2020-06-20 DIAGNOSIS — F0390 Unspecified dementia without behavioral disturbance: Secondary | ICD-10-CM | POA: Diagnosis present

## 2020-06-20 DIAGNOSIS — E785 Hyperlipidemia, unspecified: Secondary | ICD-10-CM | POA: Diagnosis present

## 2020-06-20 DIAGNOSIS — I951 Orthostatic hypotension: Secondary | ICD-10-CM | POA: Diagnosis present

## 2020-06-20 DIAGNOSIS — I110 Hypertensive heart disease with heart failure: Secondary | ICD-10-CM | POA: Diagnosis not present

## 2020-06-20 DIAGNOSIS — Z91041 Radiographic dye allergy status: Secondary | ICD-10-CM

## 2020-06-20 DIAGNOSIS — I509 Heart failure, unspecified: Secondary | ICD-10-CM

## 2020-06-20 DIAGNOSIS — E1165 Type 2 diabetes mellitus with hyperglycemia: Secondary | ICD-10-CM

## 2020-06-20 DIAGNOSIS — I420 Dilated cardiomyopathy: Secondary | ICD-10-CM | POA: Diagnosis present

## 2020-06-20 DIAGNOSIS — K219 Gastro-esophageal reflux disease without esophagitis: Secondary | ICD-10-CM | POA: Diagnosis present

## 2020-06-20 DIAGNOSIS — R471 Dysarthria and anarthria: Secondary | ICD-10-CM | POA: Diagnosis present

## 2020-06-20 DIAGNOSIS — J449 Chronic obstructive pulmonary disease, unspecified: Secondary | ICD-10-CM

## 2020-06-20 DIAGNOSIS — Z7982 Long term (current) use of aspirin: Secondary | ICD-10-CM

## 2020-06-20 DIAGNOSIS — Z90710 Acquired absence of both cervix and uterus: Secondary | ICD-10-CM

## 2020-06-20 DIAGNOSIS — E039 Hypothyroidism, unspecified: Secondary | ICD-10-CM | POA: Diagnosis present

## 2020-06-20 DIAGNOSIS — I700 Atherosclerosis of aorta: Secondary | ICD-10-CM | POA: Diagnosis present

## 2020-06-20 DIAGNOSIS — G4733 Obstructive sleep apnea (adult) (pediatric): Secondary | ICD-10-CM | POA: Diagnosis present

## 2020-06-20 DIAGNOSIS — F32A Depression, unspecified: Secondary | ICD-10-CM | POA: Diagnosis present

## 2020-06-20 DIAGNOSIS — H9192 Unspecified hearing loss, left ear: Secondary | ICD-10-CM | POA: Diagnosis present

## 2020-06-20 LAB — PROTIME-INR
INR: 1 (ref 0.8–1.2)
Prothrombin Time: 12.8 seconds (ref 11.4–15.2)

## 2020-06-20 LAB — DIFFERENTIAL
Abs Immature Granulocytes: 0.03 10*3/uL (ref 0.00–0.07)
Basophils Absolute: 0 10*3/uL (ref 0.0–0.1)
Basophils Relative: 0 %
Eosinophils Absolute: 0.2 10*3/uL (ref 0.0–0.5)
Eosinophils Relative: 2 %
Immature Granulocytes: 0 %
Lymphocytes Relative: 25 %
Lymphs Abs: 2.4 10*3/uL (ref 0.7–4.0)
Monocytes Absolute: 1.1 10*3/uL — ABNORMAL HIGH (ref 0.1–1.0)
Monocytes Relative: 11 %
Neutro Abs: 5.7 10*3/uL (ref 1.7–7.7)
Neutrophils Relative %: 62 %

## 2020-06-20 LAB — CBC
HCT: 36.1 % (ref 36.0–46.0)
Hemoglobin: 10.7 g/dL — ABNORMAL LOW (ref 12.0–15.0)
MCH: 25.9 pg — ABNORMAL LOW (ref 26.0–34.0)
MCHC: 29.6 g/dL — ABNORMAL LOW (ref 30.0–36.0)
MCV: 87.4 fL (ref 80.0–100.0)
Platelets: 331 10*3/uL (ref 150–400)
RBC: 4.13 MIL/uL (ref 3.87–5.11)
RDW: 17.2 % — ABNORMAL HIGH (ref 11.5–15.5)
WBC: 9.5 10*3/uL (ref 4.0–10.5)
nRBC: 0 % (ref 0.0–0.2)

## 2020-06-20 LAB — COMPREHENSIVE METABOLIC PANEL
ALT: 10 U/L (ref 0–44)
AST: 15 U/L (ref 15–41)
Albumin: 3.1 g/dL — ABNORMAL LOW (ref 3.5–5.0)
Alkaline Phosphatase: 92 U/L (ref 38–126)
Anion gap: 10 (ref 5–15)
BUN: 20 mg/dL (ref 8–23)
CO2: 33 mmol/L — ABNORMAL HIGH (ref 22–32)
Calcium: 9.1 mg/dL (ref 8.9–10.3)
Chloride: 98 mmol/L (ref 98–111)
Creatinine, Ser: 0.89 mg/dL (ref 0.44–1.00)
GFR, Estimated: >60 mL/min (ref >60)
Glucose, Bld: 131 mg/dL — ABNORMAL HIGH (ref 70–99)
Potassium: 3.8 mmol/L (ref 3.5–5.1)
Sodium: 141 mmol/L (ref 135–145)
Total Bilirubin: 0.4 mg/dL (ref 0.3–1.2)
Total Protein: 8.1 g/dL (ref 6.5–8.1)

## 2020-06-20 LAB — APTT: aPTT: 29 seconds (ref 24–36)

## 2020-06-20 LAB — CBG MONITORING, ED: Glucose-Capillary: 120 mg/dL — ABNORMAL HIGH (ref 70–99)

## 2020-06-20 MED ORDER — SODIUM CHLORIDE 0.9% FLUSH
3.0000 mL | Freq: Once | INTRAVENOUS | Status: AC
Start: 2020-06-20 — End: 2020-06-20
  Administered 2020-06-20: 3 mL via INTRAVENOUS

## 2020-06-20 MED ORDER — DIPHENHYDRAMINE HCL 50 MG/ML IJ SOLN
12.5000 mg | Freq: Once | INTRAMUSCULAR | Status: AC
  Administered 2020-06-20: 12.5 mg via INTRAVENOUS
  Filled 2020-06-20: qty 1

## 2020-06-20 MED ORDER — LABETALOL HCL 5 MG/ML IV SOLN: 20.0000 mg | Freq: Once | INTRAVENOUS | Status: DC

## 2020-06-20 MED ORDER — IOHEXOL 350 MG/ML SOLN
100.0000 mL | Freq: Once | INTRAVENOUS | Status: AC | PRN
  Administered 2020-06-20: 100 mL via INTRAVENOUS

## 2020-06-20 MED ORDER — ALTEPLASE 100 MG IV SOLR
INTRAVENOUS | Status: AC
  Administered 2020-06-20: 77.9 mg via INTRAVENOUS
  Filled 2020-06-20: qty 100

## 2020-06-20 MED ORDER — SODIUM CHLORIDE 0.9 % IV SOLN
50.0000 mL | Freq: Once | INTRAVENOUS | Status: AC
  Administered 2020-06-21: 50 mL via INTRAVENOUS

## 2020-06-20 MED ORDER — METHYLPREDNISOLONE SODIUM SUCC 125 MG IJ SOLR
125.0000 mg | Freq: Once | INTRAMUSCULAR | Status: AC
  Administered 2020-06-20: 125 mg via INTRAVENOUS
  Filled 2020-06-20: qty 2

## 2020-06-20 MED ORDER — ALTEPLASE (STROKE) FULL DOSE INFUSION: 0.9000 mg/kg | Freq: Once | INTRAVENOUS | Status: AC

## 2020-06-20 MED ORDER — CLEVIDIPINE BUTYRATE 0.5 MG/ML IV EMUL: 0.0000 mg/h | INTRAVENOUS | Status: DC

## 2020-06-20 MED ORDER — LABETALOL HCL 5 MG/ML IV SOLN
INTRAVENOUS | Status: AC
  Filled 2020-06-20: qty 4

## 2020-06-20 NOTE — ED Notes (Signed)
Pt to CT at this time.

## 2020-06-20 NOTE — Consult Note (Addendum)
[facility] Tele[facility]urology Consult Services   Date of Service:   06/20/2020 22:19:37  Diagnosis:     .  R47.81 - Slurred speech     .  I63.9 - Cerebrovascular accident (CVA), unspecified mechanism ([place])  Impression:     . 70yo woman with history of CHF s/p pacer, afib not on anticoagulation, HTN, DM, mild dementia presenting as a stroke alert for left sided weakness and slurred speech. Discussed 30% chance of significant improvement/reduced disability from stroke for 3% risk of SICH along with rare serious allergic reactions with patient's husband, he wished to proceed with tPA. Suspect brainstem stroke versus internal capsular (right). Recommend ICU admission per post tPA protocol with q1hr neurochecks, hold AC/AP for 24hr repeat CTH, keep BP < 180/105.  Metrics: Last Known Well: 06/20/2020 20:30:00 [facility] Notification Time: 06/20/2020 22:19:36 Arrival Time: 06/20/2020 22:10:00 Stamp Time: 06/20/2020 22:19:37 Initial Response Time: 06/20/2020 22:22:49 Symptoms: slurred speech, left sided weakness. NIHSS Start Assessment Time: 06/20/2020 22:31:39 Patient is a candidate for Thrombolytic. Thrombolytic Medical Decision: 06/20/2020 22:48:38 Needle Time: 06/20/2020 22:52:21 Weight Noted by Staff: 86.6 kg  CT head showed no acute hemorrhage or acute core infarct.  ED Physician notified of diagnostic impression and management plan on 06/20/2020 23:07:23  Advanced Imaging: CTA Head and Neck ordered  CTP ordered   Thrombolytic Contraindications:  Last Known Well > 4.5 hours: No CT Head showing hemorrhage: No Ischemic stroke within 3 months: No Severe head trauma within 3 months: No Intracranial/intraspinal surgery within 3 months: No History of intracranial hemorrhage: No Symptoms and signs consistent with an SAH: No GI malignancy or GI bleed within 21 days: No Coagulopathy: Platelets <100 000 /mm3, INR >1.7, aPTT>40 s, or PT >15 s: No Treatment dose  of LMWH within the previous 24 hrs: No Use of NOACs in past 48 hours: No Glycoprotein IIb/IIIa receptor inhibitors use: No Symptoms consistent with infective endocarditis: No Suspected aortic arch dissection: No Intra-axial intracranial neoplasm: No  Thrombolytic Decision and Management Plan: Management with thrombolytic treatment was explained to the Patient and Family as was risks and benefits and alternatives to the treatment. Patient agrees with the decision to proceed with thrombolytic treatment. . All questions were answered and the Patient and Family expressed understanding of the treatment plan.  Our recommendations are outlined below.  Recommendations: IV Alteplase recommended.  Thrombolytic bolus given Without Complication.   IV Alteplase/Activase Total Dose - 77.9 mg IV Alteplase/Activase Bolus Dose - 7.8 mg IV Alteplase/Activase Infusion Dose - 70.1 mg   Routine post Thrombolytic monitoring including neuro checks and blood pressure control during/after treatment Monitor blood pressure Check blood pressure and neuro assessment every 15 min for 2 h, then every 30 min for 6 h, and finally every hour for 16 h.  Manage Blood Pressure per post Thrombolytic protocol.      .  Admission to ICU     .  CT brain 24 hours post Thrombolytic     .  NPO until swallowing screen performed and passed     .  No antiplatelet agents or anticoagulants (including heparin for DVT prophylaxis) in first 24 hours     .  No Foley catheter, nasogastric tube, arterial catheter or central venous catheter for 24 hr, unless absolutely necessary     .  Telemetry     .  Bedside swallow evaluation     .  HOB less than 30 degrees     .  Euglycemia     .  Avoid  hyperthermia, PRN acetaminophen     .  DVT prophylaxis     .  Inpatient Neurology Consultation     .  Stroke evaluation as per inpatient neurology recommendations  Discussed with ED  physician    ------------------------------------------------------------------------------  History of Present Illness: Patient is a 70 year old Female.  Patient was brought by EMS for symptoms of slurred speech, left sided weakness.  70yo woman with history of CHF s/p pacer, afib not on anticoagulation, HTN, DM, mild dementia presenting as a stroke alert for left sided weakness and slurred speech. Initially patient arrived via EMS with no family available, attempted to call patient's husband at both listed numbers but no response. He later arrived to ED waiting area and was able to confirm history with him after short delay. He reports she was last well with normal speech at 830pm. About 840p he noticed 'slurry' speech and left arm weakness and called EMS. On my evaluation she has moderate dysarthria, L facial droop, left arm drift and hand weakness, possibly some ataxia on FTN in the R arm, not able to say the month or her age reliably. She is only on aspirin. No recent surgeries, no history of bleeding (either GIB or ICH) per husband. No known history of stroke per husband.    Past Medical History:     . Hypertension     . Diabetes Mellitus     . Hyperlipidemia     . Atrial Fibrillation     . Coronary Artery Disease  Anticoagulant use:  No  Antiplatelet use: Yes aspirin  Allergies:  Reviewed    Examination: BP(146/87), Pulse(92), Blood Glucose(120) 1A: Level of Consciousness - Alert; keenly responsive + 0 1B: Ask Month and Age - Could Not Answer Either Question Correctly + 2 1C: Blink Eyes & Squeeze Hands - Performs Both Tasks + 0 2: Test Horizontal Extraocular Movements - Normal + 0 3: Test Visual Fields - No Visual Loss + 0 4: Test Facial Palsy (Use Grimace if Obtunded) - Minor paralysis (flat nasolabial fold, smile asymmetry) + 1 5A: Test Left Arm Motor Drift - Drift, but doesn't hit bed + 1 5B: Test Right Arm Motor Drift - No Drift for 10 Seconds + 0 6A: Test Left Leg  Motor Drift - No Drift for 5 Seconds + 0 6B: Test Right Leg Motor Drift - No Drift for 5 Seconds + 0 7: Test Limb Ataxia (FNF/Heel-Shin) - Ataxia in 1 Limb + 1 8: Test Sensation - Normal; No sensory loss + 0 9: Test Language/Aphasia - Normal; No aphasia + 0 10: Test Dysarthria - Mild-Moderate Dysarthria: Slurring but can be understood + 1 11: Test Extinction/Inattention - No abnormality + 0  NIHSS Score: 6  Pre-Morbid Modified Rankin Scale: 3 Points = Moderate disability; requiring some help, but able to walk without assistance   Patient/Family was informed the Neurology Consult would occur via TeleHealth consult by way of interactive audio and video telecommunications and consented to receiving care in this manner.   Patient is being evaluated for possible acute neurologic impairment and high probability of imminent or life-threatening deterioration. I spent total of 50 minutes providing care to this patient, including time for face to face visit via telemedicine, review of medical records, imaging studies and discussion of findings with providers, the patient and/or family.   Dr Annamary Carolin   [facility] [phone]  Case [id number]   Advanced Imaging:  CTA Head and Neck Completed.  CTP Completed.  LVO:No

## 2020-06-20 NOTE — ED Notes (Signed)
Neurologist on tele-neuro at this time for report.

## 2020-06-20 NOTE — ED Provider Notes (Signed)
[facility] Emergency Department Provider Note  ____________________________________________  Time seen: Approximately 11:23 PM  I have reviewed the triage vital signs and the nursing notes.   HISTORY  Chief Complaint Code Stroke    Level 5 Caveat: Portions of the History and Physical including HPI and review of systems are unable to be completely obtained due to patient altered mental status and dementia  HPI Stacy Grimes is a 70 y.o. female with a history of AAA, COPD, heart failure, atrial fibrillation, dementia, diabetes  who was in her usual state of health until 9:00 PM when she started having left facial droop and slurred speech.  Denies pain.  No vomiting or fever, no recent trauma.  No reported seizure.  Does not take blood thinners, only baby aspirin.   Past Medical History:  Diagnosis Date  . AAA (abdominal aortic aneurysm) ([place])   . Anginal pain ([place])   . Anxiety   . Arthritis   . Automatic implantable cardioverter-defibrillator in situ   . Biventricular ICD  [employer]   . COPD (chronic obstructive pulmonary disease) ([place])   . Deafness    left ear  . Dementia ([place])   . Depression   . Dizziness   . Dysrhythmia   . Fracture    history of spinal fracture  . GERD (gastroesophageal reflux disease)   . HFrEF (heart failure with reduced ejection fraction) ([place])    a. 2010 Cath: nonobs dzs, EF 15-20%; b. 09/2017 Echo: EF suboptimal. EF low nl; c. 04/2018 Echo: EF 40-45%.  . Hypertension   . Hypothyroidism   . Non-ischemic cardiomyopathy ([place])    a. 2010 Cath: nonobs dzs, EF 15-20%; b. 2011 s/p SJM CRT-D; c. 09/2016 s/p Gen change; d. 09/2017 Echo: EF suboptimal. EF low nl; e. 04/2018 Echo: EF 40-45%.  . Oxygen dependent   . Palpitations   . Shortness of breath dyspnea   . Sleep apnea   . Wheezing      Patient Active Problem List   Diagnosis Date Noted  . Chronic obstructive pulmonary disease, unspecified COPD type ([place]) 06/20/2020  .  Type 2 diabetes mellitus with hyperglycemia, unspecified whether long term insulin use ([place]) 06/20/2020  . Left knee pain 12/16/2019  . Elevated troponin I level 12/15/2019  . Dehydration 04/12/2018  . Syncope 04/09/2018  . 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 Diverticulosis of colon (without mention of hemorrhage) 08/31/2012  . Fatigue 08/10/2012  . Personal history of colonic polyps 07/02/2012  . Dysphagia, unspecified(787.20) 07/02/2012  . Unspecified constipation 07/02/2012  . 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 HYPERTENSION, BENIGN 03/24/2009  . SLEEP APNEA, OBSTRUCTIVE 03/23/2009  . Dilated cardiomyopathy ([place]) 03/23/2009  . PALPITATIONS 03/23/2009  . MURMUR 03/23/2009  . COUGH 03/23/2009     Past Surgical History:  Procedure Laterality Date  . ABDOMINAL HYSTERECTOMY    . BIV ICD GENERATOR CHANGEOUT N/A 09/17/2016   Procedure: BiV ICD Generator Changeout;  Surgeon: Deboraha Sprang, MD;  Location: [place] [facility];  Service: Cardiovascular;  Laterality: N/A;  . BLADDER SURGERY    . CARDIAC CATHETERIZATION    . CARDIAC DEFIBRILLATOR PLACEMENT  4 yrs ago   pacemaker  . CATARACT EXTRACTION W/PHACO Left 09/01/2014   Procedure: CATARACT EXTRACTION PHACO AND INTRAOCULAR LENS PLACEMENT (IOC);  Surgeon: Lyla Glassing, MD;  Location: [facility] ORS;  Service: Ophthalmology;  Laterality: Left;  [place]: 1:12.1   . CATARACT EXTRACTION W/PHACO Right 09/29/2014   Procedure: CATARACT EXTRACTION PHACO AND INTRAOCULAR  LENS PLACEMENT (IOC);  Surgeon: Lyla Glassing, MD;  Location: [facility] ORS;  Service: Ophthalmology;  Laterality: Right;  [place]: 00:52.7   . CHOLECYSTECTOMY    . COLON SURGERY    . COLONOSCOPY    . COLONOSCOPY N/A 08/31/2012   Procedure: COLONOSCOPY;  Surgeon: Inda Castle, MD;  Location: [facility];  Service: Endoscopy;  Laterality: N/A;  . ESOPHAGOGASTRODUODENOSCOPY N/A 07/06/2012   Procedure: ESOPHAGOGASTRODUODENOSCOPY (EGD);  Surgeon:  [place] Dragon, MD;  Location: [facility];  Service: Endoscopy;  Laterality: N/A;  . KNEE SURGERY Right   . MIDDLE EAR SURGERY    . SAVORY DILATION N/A 07/06/2012   Procedure: SAVORY DILATION;  Surgeon: [place] Dragon, MD;  Location: [facility];  Service: Endoscopy;  Laterality: N/A;  . WRIST SURGERY Right      Prior to Admission medications   Medication Sig Start Date End Date Taking? Authorizing Provider  acetaminophen (TYLENOL) 500 MG tablet Take 2 tablets (1,000 mg total) by mouth every 8 (eight) hours as needed for mild pain or moderate pain. 12/17/19 12/16/20  Mercy Riding, MD  albuterol (ACCUNEB) 0.63 MG/3ML nebulizer solution Take 1 ampule by nebulization every 6 (six) hours as needed for wheezing.    [provider]  albuterol (PROVENTIL HFA;VENTOLIN HFA) 108 (90 Base) MCG/ACT inhaler Inhale 2 puffs into the lungs every 6 (six) hours as needed. 11/02/16   Schaevitz, Randall An, MD  allopurinol (ZYLOPRIM) 300 MG tablet Take 300 mg by mouth 2 (two) times daily.    [provider]  aspirin 81 MG EC tablet Take 81 mg by mouth daily.    [provider]  budesonide-formoterol (SYMBICORT) 160-4.5 MCG/ACT inhaler Inhale 2 puffs into the lungs 2 (two) times daily.    [provider]  digoxin (LANOXIN) 0.125 MG tablet Take 1 tablet (0.125 mg total) by mouth every Monday, Wednesday, and Friday. 06/19/20   [place] Hampshire, MD  donepezil (ARICEPT) 5 MG tablet Take 5 mg by mouth at bedtime.    [provider]  furosemide (LASIX) 20 MG tablet Take 1 tablet (20 mg total) by mouth daily as needed for fluid or edema. 06/19/20 07/19/20  [place] Hampshire, MD  gabapentin (NEURONTIN) 300 MG capsule Take 300 mg by mouth 2 (two) times daily as needed (RLS symptoms/ pain).     [provider]  hydrOXYzine (VISTARIL) 25 MG capsule Take 25 mg by mouth at bedtime as needed for anxiety.    [provider]  levothyroxine (SYNTHROID) 112 MCG tablet  Take 112 mcg by mouth daily before breakfast.    [provider]  Menthol, Topical Analgesic, (BENGAY EX) Apply 1 application topically daily as needed (back pain).    [provider]  metFORMIN (GLUCOPHAGE-XR) 500 MG 24 hr tablet Take 500 mg by mouth 2 (two) times daily with a meal.     [provider]  metoprolol succinate (TOPROL-XL) 25 MG 24 hr tablet Take 25 mg by mouth daily.    [provider]  omeprazole (PRILOSEC) 20 MG capsule Take 1 capsule (20 mg total) by mouth every morning. 04/12/18   [place] Lighter, MD  Sennosides (EX-LAX PO) Take 2 tablets by mouth at bedtime as needed (constipation).    [provider]  tiotropium (SPIRIVA) 18 MCG inhalation capsule Place 1 capsule (18 mcg total) into inhaler and inhale daily as needed (shortness of breath). Patient taking differently: Place 18 mcg into inhaler and inhale daily as needed (shortness of breath). 04/12/18  [place] Lighter, MD  venlafaxine (EFFEXOR) 75 MG tablet Take 75 mg by mouth 2 (two) times daily.    [provider]  vitamin B-12 (CYANOCOBALAMIN) 1000 MCG tablet Take 1,000 mcg by mouth 2 (two) times daily.     [provider]     Allergies Codeine and Contrast media [iodinated diagnostic agents]   Family History  Problem Relation Age of Onset  . Hypertension Mother   . Breast cancer Mother   . Stroke Mother   . Heart disease Father   . Heart attack Sister   . Hypertension Brother   . Heart attack Sister   . Colon cancer Brother 37    Social History Social History   Tobacco Use  . Smoking status: Former Smoker    Packs/day: 3.00    Years: 20.00    Pack years: 60.00    Types: Cigarettes    Quit date: 02/04/2002    Years since quitting: 18.3  . Smokeless tobacco: Never Used  Substance Use Topics  . Alcohol use: No  . Drug use: No    Review of Systems Level 5 Caveat: Portions of the History and Physical including HPI and review of systems  are unable to be completely obtained due to patient being a poor historian   Constitutional:   No known fever.  ENT:   No rhinorrhea. Cardiovascular:   No chest pain or syncope. Respiratory:   No dyspnea or cough. Gastrointestinal:   Negative for abdominal pain, vomiting and diarrhea.  Musculoskeletal:   Negative for focal pain or swelling ____________________________________________   PHYSICAL EXAM:  VITAL SIGNS: ED Triage Vitals  Enc Vitals Group     BP 06/20/20 2221 (!) 155/55     Pulse Rate 06/20/20 2221 (!) 107     Resp 06/20/20 2237 20     Temp 06/20/20 2221 98.3 F (36.8 C)     Temp Source 06/20/20 2221 Oral     SpO2 06/20/20 2221 97 %     Weight 06/20/20 2217 191 lb (86.6 kg)     Height --      Head Circumference --      Peak Flow --      Pain Score --      Pain Loc --      Pain Edu? --      Excl. in [place]? --     Vital signs reviewed, nursing assessments reviewed.   Constitutional:   Awake and alert, not oriented.  Ill-appearing. Eyes:   Conjunctivae are normal. EOMI. PERRL. ENT      Head:   Normocephalic and atraumatic.      Nose:   No congestion/rhinnorhea.       Mouth/Throat:   MMM, no pharyngeal erythema. No peritonsillar mass.       Neck:   No meningismus. Full ROM. Hematological/Lymphatic/Immunilogical:   No cervical lymphadenopathy. Cardiovascular:   Tachycardia, rate of 105. Symmetric bilateral radial and DP pulses.  No murmurs. Cap refill less than 2 seconds. Respiratory:   Normal respiratory effort without tachypnea/retractions. Breath sounds are clear and equal bilaterally. No wheezes/rales/rhonchi. Gastrointestinal:   Soft and nontender. Non distended. There is no CVA tenderness.  No rebound, rigidity, or guarding. Genitourinary:   deferred Musculoskeletal:   Normal range of motion in all extremities. No joint effusions.  No lower extremity tenderness.  No edema. Neurologic:   Dysarthria Left facial droop Relative left upper extremity  weakness Not oriented GCS 5 Skin:    Skin is warm,  dry and intact. No rash noted.  No petechiae, purpura, or bullae.  ____________________________________________    LABS (pertinent positives/negatives) (all labs ordered are listed, but only abnormal results are displayed) Labs Reviewed  CBC - Abnormal; Notable for the following components:      Result Value   Hemoglobin 10.7 (*)    MCH 25.9 (*)    MCHC 29.6 (*)    RDW 17.2 (*)    All other components within normal limits  DIFFERENTIAL - Abnormal; Notable for the following components:   Monocytes Absolute 1.1 (*)    All other components within normal limits  COMPREHENSIVE METABOLIC PANEL - Abnormal; Notable for the following components:   CO2 33 (*)    Glucose, Bld 131 (*)    Albumin 3.1 (*)    All other components within normal limits  CBG MONITORING, ED - Abnormal; Notable for the following components:   Glucose-Capillary 120 (*)    All other components within normal limits  RESP PANEL BY RT-PCR (FLU A&B, COVID) ARPGX2  PROTIME-INR  APTT   ____________________________________________   EKG  Interpreted by me Atrial sensed, ventricular paced rhythm, rate of 94.  Right axis, left bundle branch block, no acute ischemic changes.  ____________________________________________    RADIOLOGY  CT HEAD CODE STROKE WO CONTRAST  Result Date: 06/20/2020 CLINICAL DATA:  Code stroke.  Slurred speech and left facial droop EXAM: CT HEAD WITHOUT CONTRAST TECHNIQUE: Contiguous axial images were obtained from the base of the skull through the vertex without intravenous contrast. COMPARISON:  None. FINDINGS: Brain: There is no mass, hemorrhage or extra-axial collection. The size and configuration of the ventricles and extra-axial CSF spaces are normal. There is hypoattenuation of the white matter, most commonly indicating chronic small vessel disease. Vascular: No abnormal hyperdensity of the major intracranial arteries or dural venous  sinuses. No intracranial atherosclerosis. Skull: The visualized skull base, calvarium and extracranial soft tissues are normal. Sinuses/Orbits: No fluid levels or advanced mucosal thickening of the visualized paranasal sinuses. No mastoid or middle ear effusion. The orbits are normal. ASPECTS (Cylinder Stroke Program Early CT Score) - Ganglionic level infarction (caudate, lentiform nuclei, internal capsule, insula, M1-M3 cortex): 7 - Supraganglionic infarction (M4-M6 cortex): 3 Total score (0-10 with 10 being normal): 10 IMPRESSION: Chronic small vessel disease without acute intracranial abnormality. ASPECTS is 10. These results were called by telephone at the time of interpretation on 06/20/2020 at 10:32 pm to provider Stacy Grimes , who verbally acknowledged these results. Electronically Signed   By: Ulyses Jarred M.D.   On: 06/20/2020 22:32    ____________________________________________   PROCEDURES .Critical Care Performed by: Carrie Mew, MD Authorized by: Carrie Mew, MD   Critical care provider statement:    Critical care time (minutes):  35   Critical care time was exclusive of:  Separately billable procedures and treating other patients   Critical care was necessary to treat or prevent imminent or life-threatening deterioration of the following conditions:  CNS failure or compromise   Critical care was time spent personally by me on the following activities:  Development of treatment plan with patient or surrogate, discussions with consultants, evaluation of patient's response to treatment, examination of patient, obtaining history from patient or surrogate, ordering and performing treatments and interventions, ordering and review of laboratory studies, ordering and review of radiographic studies, pulse oximetry, re-evaluation of patient's condition and review of old charts    ____________________________________________  DIFFERENTIAL DIAGNOSIS   Acute ischemic stroke,  intracranial hemorrhage, electrolyte abnormality,  hypoglycemia  CLINICAL IMPRESSION / ASSESSMENT AND PLAN / ED COURSE  Medications ordered in the ED: Medications  alteplase (ACTIVASE) 1 mg/mL infusion 77.9 mg ( Intravenous Restarted 06/20/20 2252)    Followed by  0.9 %  sodium chloride infusion (has no administration in time range)  labetalol (NORMODYNE) injection 20 mg (has no administration in time range)    And  clevidipine (CLEVIPREX) infusion 0.5 mg/mL (has no administration in time range)  diphenhydrAMINE (BENADRYL) injection 12.5 mg (has no administration in time range)  methylPREDNISolone sodium succinate (SOLU-MEDROL) 125 mg/2 mL injection 125 mg (125 mg Intravenous Given 06/20/20 2228)  sodium chloride flush (NS) 0.9 % injection 3 mL (3 mLs Intravenous Given 06/20/20 2228)    Pertinent labs & imaging results that were available during my care of the patient were reviewed by me and considered in my medical decision making (see chart for details).   Stacy Grimes was evaluated in Emergency Department on 06/20/2020 for the symptoms described in the history of present illness. She was evaluated in the context of the global COVID-19 pandemic, which necessitated consideration that the patient might be at risk for infection with the SARS-CoV-2 virus that causes COVID-19. Institutional protocols and algorithms that pertain to the evaluation of patients at risk for COVID-19 are in a state of rapid change based on information released by regulatory bodies including the CDC and federal and state organizations. These policies and algorithms were followed during the patient's care in the ED.   Patient presents with symptoms and exam concerning for acute ischemic stroke.  Code stroke initiated, stat CT head obtained which was discussed with radiology and unremarkable.  Discussed with neurology, they have recommended tPA which the patient and husband have agreed to.  She was premedicated with  Solu-Medrol for potential CT angiogram.  Due to her acuity, risk-benefit analysis of potential IV contrast allergy that was reported in the past as a rash favors obtaining this emergent CT scan without waiting multiple hours.  Discussed this with radiology Dr. Christa See as well.  Notified CT technologist to call me right away if there are any new symptoms or concerns after IV contrast infusion at which point I will come to radiology suite right away.  Patient will need ICU admission, can stay at [facility] if CTA negative for LVO.       ____________________________________________   FINAL CLINICAL IMPRESSION(S) / ED DIAGNOSES    Final diagnoses:  Acute ischemic stroke (HCC)  Chronic obstructive pulmonary disease, unspecified COPD type ([place])  Type 2 diabetes mellitus with hyperglycemia, unspecified whether long term insulin use (HCC)  Atrial fibrillation, unspecified type ([place])  Chronic congestive heart failure, unspecified heart failure type [facility])     ED Discharge Orders    None      Portions of this note were generated with dragon dictation software. Dictation errors may occur despite best attempts at proofreading.   Carrie Mew, MD 06/20/20 2332

## 2020-06-20 NOTE — Progress Notes (Signed)
Emotional support for spouse, prayer for patient.

## 2020-06-20 NOTE — ED Triage Notes (Signed)
Pt presents to ER via ems from home. Around 2100 pt began having slurred speech, left sided facial droop.  Pt has hx afib, CHF, diabetes.  No blood thinners noted.  Pt on 4L [place] chronically.

## 2020-06-20 NOTE — ED Notes (Signed)
Pt arrived back in room from CT

## 2020-06-21 ENCOUNTER — Inpatient Hospital Stay: Payer: Medicare Other

## 2020-06-21 ENCOUNTER — Telehealth: Payer: Self-pay | Admitting: Cardiovascular Disease

## 2020-06-21 ENCOUNTER — Other Ambulatory Visit: Payer: Self-pay

## 2020-06-21 DIAGNOSIS — I471 Supraventricular tachycardia: Secondary | ICD-10-CM | POA: Diagnosis present

## 2020-06-21 DIAGNOSIS — G8194 Hemiplegia, unspecified affecting left nondominant side: Secondary | ICD-10-CM | POA: Diagnosis present

## 2020-06-21 DIAGNOSIS — R471 Dysarthria and anarthria: Secondary | ICD-10-CM | POA: Diagnosis present

## 2020-06-21 DIAGNOSIS — F32A Depression, unspecified: Secondary | ICD-10-CM | POA: Diagnosis present

## 2020-06-21 DIAGNOSIS — I509 Heart failure, unspecified: Secondary | ICD-10-CM | POA: Diagnosis not present

## 2020-06-21 DIAGNOSIS — I340 Nonrheumatic mitral (valve) insufficiency: Secondary | ICD-10-CM | POA: Diagnosis not present

## 2020-06-21 DIAGNOSIS — G4733 Obstructive sleep apnea (adult) (pediatric): Secondary | ICD-10-CM | POA: Diagnosis present

## 2020-06-21 DIAGNOSIS — I361 Nonrheumatic tricuspid (valve) insufficiency: Secondary | ICD-10-CM | POA: Diagnosis not present

## 2020-06-21 DIAGNOSIS — R29810 Facial weakness: Secondary | ICD-10-CM | POA: Diagnosis present

## 2020-06-21 DIAGNOSIS — I428 Other cardiomyopathies: Secondary | ICD-10-CM | POA: Diagnosis present

## 2020-06-21 DIAGNOSIS — H9192 Unspecified hearing loss, left ear: Secondary | ICD-10-CM | POA: Diagnosis present

## 2020-06-21 DIAGNOSIS — R29707 NIHSS score 7: Secondary | ICD-10-CM | POA: Diagnosis present

## 2020-06-21 DIAGNOSIS — J449 Chronic obstructive pulmonary disease, unspecified: Secondary | ICD-10-CM | POA: Diagnosis not present

## 2020-06-21 DIAGNOSIS — I5022 Chronic systolic (congestive) heart failure: Secondary | ICD-10-CM | POA: Diagnosis present

## 2020-06-21 DIAGNOSIS — I951 Orthostatic hypotension: Secondary | ICD-10-CM | POA: Diagnosis present

## 2020-06-21 DIAGNOSIS — R410 Disorientation, unspecified: Secondary | ICD-10-CM | POA: Diagnosis not present

## 2020-06-21 DIAGNOSIS — F0390 Unspecified dementia without behavioral disturbance: Secondary | ICD-10-CM | POA: Diagnosis present

## 2020-06-21 DIAGNOSIS — K219 Gastro-esophageal reflux disease without esophagitis: Secondary | ICD-10-CM | POA: Diagnosis present

## 2020-06-21 DIAGNOSIS — E1165 Type 2 diabetes mellitus with hyperglycemia: Secondary | ICD-10-CM | POA: Diagnosis not present

## 2020-06-21 DIAGNOSIS — I6389 Other cerebral infarction: Secondary | ICD-10-CM | POA: Diagnosis not present

## 2020-06-21 DIAGNOSIS — E785 Hyperlipidemia, unspecified: Secondary | ICD-10-CM | POA: Diagnosis present

## 2020-06-21 DIAGNOSIS — I110 Hypertensive heart disease with heart failure: Secondary | ICD-10-CM | POA: Diagnosis present

## 2020-06-21 DIAGNOSIS — E039 Hypothyroidism, unspecified: Secondary | ICD-10-CM | POA: Diagnosis present

## 2020-06-21 DIAGNOSIS — Z20822 Contact with and (suspected) exposure to covid-19: Secondary | ICD-10-CM | POA: Diagnosis present

## 2020-06-21 DIAGNOSIS — I639 Cerebral infarction, unspecified: Principal | ICD-10-CM

## 2020-06-21 DIAGNOSIS — J441 Chronic obstructive pulmonary disease with (acute) exacerbation: Secondary | ICD-10-CM | POA: Diagnosis present

## 2020-06-21 DIAGNOSIS — I4891 Unspecified atrial fibrillation: Secondary | ICD-10-CM

## 2020-06-21 DIAGNOSIS — I482 Chronic atrial fibrillation, unspecified: Secondary | ICD-10-CM | POA: Diagnosis present

## 2020-06-21 DIAGNOSIS — I420 Dilated cardiomyopathy: Secondary | ICD-10-CM | POA: Diagnosis present

## 2020-06-21 DIAGNOSIS — M1990 Unspecified osteoarthritis, unspecified site: Secondary | ICD-10-CM | POA: Diagnosis present

## 2020-06-21 DIAGNOSIS — F419 Anxiety disorder, unspecified: Secondary | ICD-10-CM | POA: Diagnosis present

## 2020-06-21 DIAGNOSIS — I2510 Atherosclerotic heart disease of native coronary artery without angina pectoris: Secondary | ICD-10-CM | POA: Diagnosis present

## 2020-06-21 LAB — CBC
HCT: 37.2 % (ref 36.0–46.0)
Hemoglobin: 10.9 g/dL — ABNORMAL LOW (ref 12.0–15.0)
MCH: 25.5 pg — ABNORMAL LOW (ref 26.0–34.0)
MCHC: 29.3 g/dL — ABNORMAL LOW (ref 30.0–36.0)
MCV: 86.9 fL (ref 80.0–100.0)
Platelets: 337 10*3/uL (ref 150–400)
RBC: 4.28 MIL/uL (ref 3.87–5.11)
RDW: 17 % — ABNORMAL HIGH (ref 11.5–15.5)
WBC: 16.6 10*3/uL — ABNORMAL HIGH (ref 4.0–10.5)
nRBC: 0 % (ref 0.0–0.2)

## 2020-06-21 LAB — BASIC METABOLIC PANEL
Anion gap: 10 (ref 5–15)
BUN: 19 mg/dL (ref 8–23)
CO2: 31 mmol/L (ref 22–32)
Calcium: 8.8 mg/dL — ABNORMAL LOW (ref 8.9–10.3)
Chloride: 99 mmol/L (ref 98–111)
Creatinine, Ser: 0.85 mg/dL (ref 0.44–1.00)
GFR, Estimated: >60 mL/min (ref >60)
Glucose, Bld: 196 mg/dL — ABNORMAL HIGH (ref 70–99)
Potassium: 4 mmol/L (ref 3.5–5.1)
Sodium: 140 mmol/L (ref 135–145)

## 2020-06-21 LAB — LIPID PANEL
Cholesterol: 221 mg/dL — ABNORMAL HIGH (ref 0–200)
HDL: 60 mg/dL (ref >40)
LDL Cholesterol: 139 mg/dL — ABNORMAL HIGH (ref 0–99)
Total CHOL/HDL Ratio: 3.7 RATIO
Triglycerides: 109 mg/dL (ref <150)
VLDL: 22 mg/dL (ref 0–40)

## 2020-06-21 LAB — HEMOGLOBIN A1C
Hgb A1c MFr Bld: 7.5 % — ABNORMAL HIGH (ref 4.8–5.6)
Mean Plasma Glucose: 168.55 mg/dL

## 2020-06-21 LAB — MAGNESIUM: Magnesium: 2 mg/dL (ref 1.7–2.4)

## 2020-06-21 LAB — GLUCOSE, CAPILLARY
Glucose-Capillary: 185 mg/dL — ABNORMAL HIGH (ref 70–99)
Glucose-Capillary: 208 mg/dL — ABNORMAL HIGH (ref 70–99)
Glucose-Capillary: 168 mg/dL — ABNORMAL HIGH (ref 70–99)
Glucose-Capillary: 133 mg/dL — ABNORMAL HIGH (ref 70–99)

## 2020-06-21 LAB — MRSA PCR SCREENING: MRSA by PCR: NEGATIVE

## 2020-06-21 LAB — PHOSPHORUS: Phosphorus: 3.2 mg/dL (ref 2.5–4.6)

## 2020-06-21 LAB — RESP PANEL BY RT-PCR (FLU A&B, COVID) ARPGX2
Influenza A by PCR: NEGATIVE
Influenza B by PCR: NEGATIVE
SARS Coronavirus 2 by RT PCR: NEGATIVE

## 2020-06-21 MED ORDER — CHLORHEXIDINE GLUCONATE 0.12 % MT SOLN
15.0000 mL | Freq: Two times a day (BID) | OROMUCOSAL | Status: DC
  Administered 2020-06-21 (×2): 15 mL via OROMUCOSAL
  Filled 2020-06-21 (×2): qty 15

## 2020-06-21 MED ORDER — SODIUM CHLORIDE 0.9 % IV SOLN: INTRAVENOUS | Status: DC

## 2020-06-21 MED ORDER — DOCUSATE SODIUM 100 MG PO CAPS: 100.0000 mg | ORAL_CAPSULE | Freq: Two times a day (BID) | ORAL | Status: DC | PRN

## 2020-06-21 MED ORDER — POLYETHYLENE GLYCOL 3350 17 G PO PACK: 17.0000 g | PACK | Freq: Every day | ORAL | Status: DC | PRN

## 2020-06-21 MED ORDER — ALBUTEROL SULFATE HFA 108 (90 BASE) MCG/ACT IN AERS
2.0000 | INHALATION_SPRAY | Freq: Four times a day (QID) | RESPIRATORY_TRACT | Status: DC | PRN
  Filled 2020-06-21: qty 6.7

## 2020-06-21 MED ORDER — ACETAMINOPHEN 650 MG RE SUPP: 650.0000 mg | RECTAL | Status: DC | PRN

## 2020-06-21 MED ORDER — ACETAMINOPHEN 325 MG PO TABS
650.0000 mg | ORAL_TABLET | ORAL | Status: DC | PRN
  Administered 2020-06-21 – 2020-06-23 (×2): 650 mg via ORAL
  Filled 2020-06-21 (×2): qty 2

## 2020-06-21 MED ORDER — INSULIN ASPART 100 UNIT/ML IJ SOLN
0.0000 [IU] | Freq: Three times a day (TID) | INTRAMUSCULAR | Status: DC
  Administered 2020-06-21: 5 [IU] via SUBCUTANEOUS
  Administered 2020-06-21: 3 [IU] via SUBCUTANEOUS
  Administered 2020-06-22 – 2020-06-23 (×3): 2 [IU] via SUBCUTANEOUS
  Filled 2020-06-21 (×5): qty 1

## 2020-06-21 MED ORDER — ACETAMINOPHEN 160 MG/5ML PO SOLN
650.0000 mg | ORAL | Status: DC | PRN
  Filled 2020-06-21: qty 20.3

## 2020-06-21 MED ORDER — IPRATROPIUM-ALBUTEROL 0.5-2.5 (3) MG/3ML IN SOLN: 3.0000 mL | Freq: Four times a day (QID) | RESPIRATORY_TRACT | Status: DC | PRN

## 2020-06-21 MED ORDER — TIOTROPIUM BROMIDE MONOHYDRATE 18 MCG IN CAPS
18.0000 ug | ORAL_CAPSULE | Freq: Every day | RESPIRATORY_TRACT | Status: DC | PRN
  Filled 2020-06-21: qty 5

## 2020-06-21 MED ORDER — CHLORHEXIDINE GLUCONATE CLOTH 2 % EX PADS
6.0000 | MEDICATED_PAD | Freq: Every day | CUTANEOUS | Status: DC
  Administered 2020-06-21 – 2020-06-22 (×2): 6 via TOPICAL

## 2020-06-21 MED ORDER — SENNOSIDES-DOCUSATE SODIUM 8.6-50 MG PO TABS: 1.0000 | ORAL_TABLET | Freq: Every evening | ORAL | Status: DC | PRN

## 2020-06-21 MED ORDER — INSULIN ASPART 100 UNIT/ML IJ SOLN: 0.0000 [IU] | Freq: Every day | INTRAMUSCULAR | Status: DC

## 2020-06-21 MED ORDER — ORAL CARE MOUTH RINSE
15.0000 mL | Freq: Two times a day (BID) | OROMUCOSAL | Status: DC
  Administered 2020-06-21: 15 mL via OROMUCOSAL

## 2020-06-21 MED ORDER — DONEPEZIL HCL 5 MG PO TABS
10.0000 mg | ORAL_TABLET | Freq: Every day | ORAL | Status: DC
  Administered 2020-06-21 – 2020-06-22 (×2): 10 mg via ORAL
  Filled 2020-06-21 (×3): qty 2

## 2020-06-21 MED ORDER — PANTOPRAZOLE SODIUM 40 MG IV SOLR
40.0000 mg | Freq: Every day | INTRAVENOUS | Status: DC
  Administered 2020-06-21 – 2020-06-22 (×3): 40 mg via INTRAVENOUS
  Filled 2020-06-21 (×3): qty 40

## 2020-06-21 MED ORDER — LABETALOL HCL 5 MG/ML IV SOLN
10.0000 mg | INTRAVENOUS | Status: DC | PRN
  Administered 2020-06-22: 10 mg via INTRAVENOUS
  Filled 2020-06-21: qty 4

## 2020-06-21 MED ORDER — STROKE: EARLY STAGES OF RECOVERY BOOK: Freq: Once | Status: AC

## 2020-06-21 NOTE — Evaluation (Addendum)
Clinical/Bedside Swallow Evaluation Patient Details  Name: Stacy Grimes MRN: [id number] Date of Birth: 1950/08/21  Today's Date: 06/21/2020 Time: SLP Start Time (ACUTE ONLY): 1010 SLP Stop Time (ACUTE ONLY): 1110 SLP Time Calculation (min) (ACUTE ONLY): 60 min  Past Medical History:  Past Medical History:  Diagnosis Date  . AAA (abdominal aortic aneurysm) ([place])   . Anginal pain ([place])   . Anxiety   . Arthritis   . Automatic implantable cardioverter-defibrillator in situ   . Biventricular ICD  [employer]   . COPD (chronic obstructive pulmonary disease) ([place])   . Deafness    left ear  . Dementia ([place])   . Depression   . Dizziness   . Dysrhythmia   . Fracture    history of spinal fracture  . GERD (gastroesophageal reflux disease)   . HFrEF (heart failure with reduced ejection fraction) ([place])    a. 2010 Cath: nonobs dzs, EF 15-20%; b. 09/2017 Echo: EF suboptimal. EF low nl; c. 04/2018 Echo: EF 40-45%.  . Hypertension   . Hypothyroidism   . Non-ischemic cardiomyopathy ([place])    a. 2010 Cath: nonobs dzs, EF 15-20%; b. 2011 s/p SJM CRT-D; c. 09/2016 s/p Gen change; d. 09/2017 Echo: EF suboptimal. EF low nl; e. 04/2018 Echo: EF 40-45%.  . Oxygen dependent   . Palpitations   . Shortness of breath dyspnea   . Sleep apnea   . Wheezing    Past Surgical History:  Past Surgical History:  Procedure Laterality Date  . ABDOMINAL HYSTERECTOMY    . BIV ICD GENERATOR CHANGEOUT N/A 09/17/2016   Procedure: BiV ICD Generator Changeout;  Surgeon: Deboraha Sprang, MD;  Location: [facility];  Service: Cardiovascular;  Laterality: N/A;  . BLADDER SURGERY    . CARDIAC CATHETERIZATION    . CARDIAC DEFIBRILLATOR PLACEMENT  4 yrs ago   pacemaker  . CATARACT EXTRACTION W/PHACO Left 09/01/2014   Procedure: CATARACT EXTRACTION PHACO AND INTRAOCULAR LENS PLACEMENT (IOC);  Surgeon: Lyla Glassing, MD;  Location: [facility];  Service: Ophthalmology;  Laterality: Left;  [place]: 1:12.1   . CATARACT EXTRACTION  W/PHACO Right 09/29/2014   Procedure: CATARACT EXTRACTION PHACO AND INTRAOCULAR LENS PLACEMENT (IOC);  Surgeon: Lyla Glassing, MD;  Location: [facility];  Service: Ophthalmology;  Laterality: Right;  [place]: 00:52.7   . CHOLECYSTECTOMY    . COLON SURGERY    . COLONOSCOPY    . COLONOSCOPY N/A 08/31/2012   Procedure: COLONOSCOPY;  Surgeon: Inda Castle, MD;  Location: [facility];  Service: Endoscopy;  Laterality: N/A;  . ESOPHAGOGASTRODUODENOSCOPY N/A 07/06/2012   Procedure: ESOPHAGOGASTRODUODENOSCOPY (EGD);  Surgeon: Lafayette Dragon, MD;  Location: [facility];  Service: Endoscopy;  Laterality: N/A;  . KNEE SURGERY Right   . MIDDLE EAR SURGERY    . SAVORY DILATION N/A 07/06/2012   Procedure: SAVORY DILATION;  Surgeon: Lafayette Dragon, MD;  Location: [facility];  Service: Endoscopy;  Laterality: N/A;  . WRIST SURGERY Right    HPI:  Pt is a 70yo woman with history of multiple medical issues including Dementia(worse in recent months per Family, on medication w/ Neurology), GERD, OSA, deafness in Left ear, Obesity, CHF s/p pacer, afib not on anticoagulation, HTN, DM, Edentulous presenting as a stroke w/ Left sided weakness and slurred speech.  Pt admitted w/ diagnosis of acute ischemic stroke and she is s/p TPA; she has a history of Afib and was not on anticoagulation.  Head CT: "Chronic small vessel disease without acute intracranial abnormality".  Assessment / Plan / Recommendation Clinical Impression  Pt appears to present w/ grossly adequate oropharyngeal phase swallow suspect close to her Baseline at home w/ No oropharyngeal phase dysphagia noted during po trials given. Pt consumed po trials w/ No overt, clinical s/s of aspiration during po trials. However, pt does have Baseline Dementia which Family stated has "worsened" in recent months w/ plan to "up her Dementia medication" per Family. ANY decline in Cognitive status, Dementia, can impact overall awareness/engagement and safety during po tasks  which increases risk for aspiration, choking. Pt also has a Baseline of GERD and is Edentulous - these factors can increase risk for Esophageal Dysmotility which can impact swallowing. Pt's risk for aspiration can be reduced by following general aspiration and Reflux precautions as well as offering Monitoring during meals for follow through w/ precautions. Pt was restless in the bed at this eval. During po trials, pt consumed all consistencies w/ no overt coughing, decline in vocal quality, or change in respiratory presentation during/post trials. O2 sats remained 96%. Oral phase appeared grossly [facility] w/ timely bolus management, mashing/gumming of solids, and control of bolus propulsion for A-P transfer for swallowing. Oral clearing achieved w/ all trial consistencies -- min more Time needed for oral phase management of the solid foods d/t Edentulous status. OM Exam revealed Left lingual deviation and Left facial weakness/decreased labial tone -- both Mild. Lingual strength/ROM were WFL. Gag+. Speech 100% intelligible w/ slight decreased articulatory precision of certain labial sounds. Pt fed self w/ setup support. Cues given to drink/eat Slowly d/t her ease of distraction.  Recommend a Mech Soft consistency diet w/ MINCED meats, moistened foods; Thin liquids VIA CUP IF ANY INCREASED COUGHING NOTED W/ STRAWS. Recommend general aspiration precautions; GERD precautions. Pills WHOLE in Puree for safer, easier swallowing d/t Cognitive decline. Education given on Pills in Puree; food consistencies and easy to eat options; general aspiration/Reflux precautions and posted in room. Met briefly w/ family as they arrived post eval. NSG agreed. MD updated. SLP Visit Diagnosis: Dysphagia, unspecified (R13.10)    Aspiration Risk  Mild aspiration risk;Risk for inadequate nutrition/hydration (d/t Cognitive decline; reduced following precautions)    Diet Recommendation  Dysphagia level 3 w/ Meats Minced, gravies; Thin  liquids via CUP IF needed (if any coughing using straws). General aspiration precautions. GERD/REFLUX precautions. Monitoring and setup at meals d/t Baseline Dementia, Cognitive decline. Reduce distractions at meals(turning off TV, reduce talking)  Medication Administration: Whole meds with puree (for safer swallowing d/t Cognitive decline)    Other  Recommendations Recommended Consults:  (Dietician f/u) Oral Care Recommendations: Oral care BID;Patient independent with oral care;Staff/trained caregiver to provide oral care Other Recommendations:  (n/a)   Follow up Recommendations None      Frequency and Duration min 2x/week  1 week       Prognosis Prognosis for Safe Diet Advancement: Fair (-Good) Barriers to Reach Goals: Cognitive deficits;Time post onset;Severity of deficits;Behavior Barriers/Prognosis Comment: worsening Dementia per family      Swallow Study   General Date of Onset: 06/20/20 HPI: Pt is a 70yo woman with history of multiple medical issues including Dementia(worse in recent months per Family, on medication w/ Neurology), GERD, OSA, deafness in Left ear, Obesity, CHF s/p pacer, afib not on anticoagulation, HTN, DM, Edentulous presenting as a stroke w/ Left sided weakness and slurred speech.  Pt admitted w/ diagnosis of acute ischemic stroke and she is s/p TPA; she has a history of Afib and was not on anticoagulation.  Head  CT: "Chronic small vessel disease without acute intracranial abnormality". Type of Study: Bedside Swallow Evaluation Previous Swallow Assessment: none Diet Prior to this Study: NPO (regular diet at home per pt) Temperature Spikes Noted: No (wbc elevated, 16.6) Respiratory Status: Nasal cannula (4L) History of Recent Intubation: No Behavior/Cognition: Alert;Cooperative;Pleasant mood;Confused;Distractible;Requires cueing (baseline Dementia) Oral Cavity Assessment: Dry (min) Oral Care Completed by SLP: Yes Oral Cavity - Dentition: Edentulous (does not  wear dentures) Vision: Functional for self-feeding Self-Feeding Abilities: Able to feed self;Needs assist;Needs set up (monitoring) Patient Positioning: Upright in bed (needed min positioning support) Baseline Vocal Quality: Normal Volitional Cough: Strong Volitional Swallow: Able to elicit    Oral/Motor/Sensory Function Overall Oral Motor/Sensory Function: Mild impairment Facial ROM: Reduced left;Suspected CN VII (facial) dysfunction Facial Symmetry: Abnormal symmetry left;Suspected CN VII (facial) dysfunction Facial Strength: Reduced left;Suspected CN VII (facial) dysfunction Lingual ROM: Within Functional Limits Lingual Symmetry: Abnormal symmetry left;Suspected CN XII (hypoglossal) dysfunction Lingual Strength: Within Functional Limits Velum: Within Functional Limits Mandible: Within Functional Limits   Ice Chips Ice chips: Within functional limits Presentation: Spoon (fed; 4 trials)   Thin Liquid Thin Liquid: Within functional limits Presentation: Cup;Self Fed;Straw (5 trials via each, then ~2 ozs of juice at end.) Other Comments: however, noted coughing when in room alone post eval    Nectar Thick Nectar Thick Liquid: Not tested   Honey Thick Honey Thick Liquid: Not tested   Puree Puree: Within functional limits Presentation: Spoon;Self Fed (3 ozs)   Solid     Solid: Impaired (min) Presentation: Spoon;Self Fed (8 trials of broken down graham crackers in puree) Oral Phase Impairments: Impaired mastication (Edentulous) Oral Phase Functional Implications: Impaired mastication (time needed) Pharyngeal Phase Impairments:  (none)        Orinda Kenner, MS, [employer] [facility] [phone] Natascha Edmonds 06/21/2020,12:16 PM

## 2020-06-21 NOTE — Telephone Encounter (Signed)
Patient is admitted to ICU with the potential of a stroke. Family wanted office to be aware

## 2020-06-21 NOTE — Progress Notes (Signed)
Paged Dr. Cheral Marker with neurology for changes in NIH score. This may be due to differing assessment opinions. Per Dr. Cheral Marker No need for stat CT at this time, he will see the Pt. Per Neurology continue to monitor closely.

## 2020-06-21 NOTE — Plan of Care (Signed)
  Problem: Education: Goal: Knowledge of patient specific risk factors addressed and post discharge goals established will improve Outcome: Not Progressing Note: Pt educated on stroke risk factors. Pt unable to repeat these back to RN. Pt is forgetful and education should be provided with husband at bedside. Education left at bedside table at this time.

## 2020-06-21 NOTE — Telephone Encounter (Signed)
Noted  

## 2020-06-21 NOTE — H&P (Addendum)
NAME:  Stacy Grimes, MRN:  [id number], DOB:  07/12/1950, LOS: 0 ADMISSION DATE:  06/20/2020, CONSULTATION DATE:  06/21/2020 REFERRING MD:  Blake Divine MD CHIEF COMPLAINT:  Altered Mental Status   History of present illness   70 year old female with past medical history as below presenting to the ED with chief complaints of slurred speech and left sided weakness and left facial droop.  Per patient's husband at the bedside, patient was in the bathroom when she yelled out for help.  Patient's husband went to check on her and noticed that she was slurring her speech and having difficulties with word finding.  Patient's husband states that he last spoke with her at around 8:30 PM and at that time her speech was clear.  He also noticed that patient's left face was drooping and she was drooling from the left corner of her mouth.  On arrival to the ED, she was afebrile with blood pressure 155/55 mm Hg and pulse rate 107 beats/min. There were focal neurological deficits present; left sided weakness, left facial droop.initial labs revealed elevated CO2 of 33 and glucose of 131. Unremarkable CBC. ECG showed Atrial sensed, ventricular paced rhythm, rate of 94.  Right axis, left bundle branch block, no acute ischemic changes. A non-contrast head CT showed no  acute infarction or hemorrhage. Code stroke was initiated and patient evaluated by tele neurologist.  Initial NIH stroke scale 6. Last Known Well: 06/20/2020 20:30:00. Patient was deemed a candidate for IV alteplase per neurologist.  Patient received IV alteplase needle Time: 06/20/2020 22:52:21. CTA of the head and neck was obtained and showed no LVO.  PCCM consulted for admission.  Past Medical History  . AAA . Anxiety and Depression . Arthritis . Automatic implantable cardioverter-defibrillator in situ . Biventricular ICD  [employer] . COPD (chronic obstructive pulmonary disease) ([place]) . Deafness left ear . Dementia ([place]) . History of spinal  fracture . GERD (gastroesophageal reflux disease) . HFrEF (heart failure with reduced ejection fraction) ([place]) . 2010 Cath: nonobs dzs, EF 15-20%; b. 09/2017 Echo: EF suboptimal. EF low nl; c. 04/2018 Echo: EF 40-45%. Marland Kitchen Hypothyroidism . Non-ischemic cardiomyopathy ([place]) . 2010 Cath: nonobs dzs, EF 15-20%; b. 2011 s/p SJM CRT-D; c. 09/2016 s/p Gen change; d. 09/2017 Echo: EF suboptimal. EF low nl; e. 04/2018 Echo: EF 40-45%. . Chronic obstructive pulmonary disease, unspecified COPD type ([place]) . Type 2 diabetes mellitus with hyperglycemia, unspecified whether long term insulin use ([place]) . Benign neoplasm of colon . Diverticulosis of colon (without mention of hemorrhage) . Fatigue . Personal history of colonic polyps . Dysphagia, unspecified(787.20) . Unspecified constipation . 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 . Hypertension . Obstructive sleep apnea  Significant Hospital Events   5/17: Admitted to ICU with acute ischemic stroke status post IV alteplase  Consults:  Neurology  Procedures:  None  Significant Diagnostic Tests:  5/17: Noncontrast CT head>Chronic small vessel disease without acute intracranial abnormality. ASPECTS is 10. 5/18: CTA head and neck/CTP>No emergent large vessel occlusion or high-grade stenosis of the intracranial arteries. 2. Normal CT perfusion.  Micro Data:  5/17: SARS-CoV-2 PCR>> negative 5/17: Influenza PCR>> negative  Antimicrobials:   OBJECTIVE  Blood pressure (!) 145/70, pulse 98, temperature 98.3 F (36.8 C), temperature source Oral, resp. rate 20, weight 86.6 kg, SpO2 96 %.        Intake/Output Summary (Last 24 hours) at 06/21/2020 0140 Last data filed at 06/21/2020 0022 Gross per 24 hour  Intake 97.9 ml  Output --  Net  97.9 ml   Filed Weights   06/20/20 2217  Weight: 86.6 kg     Physical Examination  GENERAL:70 year-old patient lying in the bed with no acute distress.  EYES: Pupils equal, round,  reactive to light and accommodation. No scleral icterus. Extraocular muscles intact.  HEENT: Head atraumatic, normocephalic. Oropharynx and nasopharynx clear.  NECK:  Supple, no jugular venous distention. No thyroid enlargement, no tenderness.  LUNGS: Normal breath sounds bilaterally, no wheezing, rales,rhonchi or crepitation. No use of accessory muscles of respiration.  CARDIOVASCULAR: S1, S2 normal. No murmurs, rubs, or gallops.  ABDOMEN: Soft, nontender, nondistended. Bowel sounds present. No organomegaly or mass.  EXTREMITIES: No pedal edema, cyanosis, or clubbing.  NEUROLOGIC: Cranial nerves II through XII are intact. Except mild left facial droop and mild slurred speech. Muscle strength 5/5 in all extremities. Sensation intact. Gait not checked.  See detailed NIH stroke scale PSYCHIATRIC: The patient is alert and oriented x 3.  SKIN: No obvious rash, lesion, or ulcer.   Labs/imaging that I havepersonally reviewed  (right click and "Reselect all SmartList Selections" daily)     Labs   CBC: Recent Labs  Lab 06/20/20 2218  WBC 9.5  NEUTROABS 5.7  HGB 10.7*  HCT 36.1  MCV 87.4  PLT 627    Basic Metabolic Panel: Recent Labs  Lab 06/20/20 2218  NA 141  K 3.8  CL 98  CO2 33*  GLUCOSE 131*  BUN 20  CREATININE 0.89  CALCIUM 9.1   GFR: Estimated Creatinine Clearance: 60.5 mL/min (by C-G formula based on SCr of 0.89 mg/dL). Recent Labs  Lab 06/20/20 2218  WBC 9.5    Liver Function Tests: Recent Labs  Lab 06/20/20 2218  AST 15  ALT 10  ALKPHOS 92  BILITOT 0.4  PROT 8.1  ALBUMIN 3.1*   No results for input(s): LIPASE, AMYLASE in the last 168 hours. No results for input(s): AMMONIA in the last 168 hours.  ABG No results found for: PHART, PCO2ART, PO2ART, HCO3, TCO2, ACIDBASEDEF, O2SAT   Coagulation Profile: Recent Labs  Lab 06/20/20 2218  INR 1.0    Cardiac Enzymes: No results for input(s): CKTOTAL, CKMB, CKMBINDEX, TROPONINI in the last 168  hours.  HbA1C: Hgb A1c MFr Bld  Date/Time Value Ref Range Status  12/15/2019 05:04 PM 7.3 (H) 4.8 - 5.6 % Final    Comment:    (NOTE) Pre diabetes:          5.7%-6.4%  Diabetes:              >6.4%  Glycemic control for   <7.0% adults with diabetes     CBG: Recent Labs  Lab 06/20/20 2212  GLUCAP 120*    Review of Systems:   Review of Systems  Constitutional: Negative.   HENT: Negative.   Eyes: Negative.   Respiratory: Positive for wheezing. Negative for cough, hemoptysis, sputum production and shortness of breath.   Cardiovascular: Negative.   Gastrointestinal: Negative.   Genitourinary: Negative.   Musculoskeletal: Negative.   Skin: Negative.   Neurological: Positive for speech change, focal weakness and weakness.  Endo/Heme/Allergies: Negative.   Psychiatric/Behavioral: Positive for depression. The patient is nervous/anxious.     Past Medical History  She,  has a past medical history of AAA (abdominal aortic aneurysm) ([place]), Anginal pain ([place]), Anxiety, Arthritis, Automatic implantable cardioverter-defibrillator in situ, Biventricular ICD  [employer], COPD (chronic obstructive pulmonary disease) ([place]), Deafness, Dementia ([place]), Depression, Dizziness, Dysrhythmia, Fracture, GERD (gastroesophageal reflux disease), HFrEF (heart failure  with reduced ejection fraction) ([place]), Hypertension, Hypothyroidism, Non-ischemic cardiomyopathy ([place]), Oxygen dependent, Palpitations, Shortness of breath dyspnea, Sleep apnea, and Wheezing.   Surgical History    Past Surgical History:  Procedure Laterality Date  . ABDOMINAL HYSTERECTOMY    . BIV ICD GENERATOR CHANGEOUT N/A 09/17/2016   Procedure: BiV ICD Generator Changeout;  Surgeon: Deboraha Sprang, MD;  Location: [facility];  Service: Cardiovascular;  Laterality: N/A;  . BLADDER SURGERY    . CARDIAC CATHETERIZATION    . CARDIAC DEFIBRILLATOR PLACEMENT  4 yrs ago   pacemaker  . CATARACT EXTRACTION W/PHACO Left 09/01/2014    Procedure: CATARACT EXTRACTION PHACO AND INTRAOCULAR LENS PLACEMENT (IOC);  Surgeon: Lyla Glassing, MD;  Location: [facility] OR[facility];  Service: Ophthalmology;  Laterality: Left;  [place]: 1:12.1   . CATARACT EXTRACTION W/PHACO Right 09/29/2014   Procedure: CATARACT EXTRACTION PHACO AND INTRAOCULAR LENS PLACEMENT (IOC);  Surgeon: Lyla Glassing, MD;  Location: [facility] ORS;  Service: Ophthalmology;  Laterality: Right;  [place]: 00:52.7   . CHOLECYSTECTOMY    . COLON SURGERY    . COLONOSCOPY    . COLONOSCOPY N/A 08/31/2012   Procedure: COLONOSCOPY;  Surgeon: Inda Castle, MD;  Location: [facility];  Service: Endoscopy;  Laterality: N/A;  . ESOPHAGOGASTRODUODENOSCOPY N/A 07/06/2012   Procedure: ESOPHAGOGASTRODUODENOSCOPY (EGD);  Surgeon: Lafayette Dragon, MD;  Location: [facility];  Service: Endoscopy;  Laterality: N/A;  . KNEE SURGERY Right   . MIDDLE EAR SURGERY    . SAVORY DILATION N/A 07/06/2012   Procedure: SAVORY DILATION;  Surgeon: Lafayette Dragon, MD;  Location: [facility];  Service: Endoscopy;  Laterality: N/A;  . WRIST SURGERY Right      Social History   reports that she quit smoking about 18 years ago. Her smoking use included cigarettes. She has a 60.00 pack-year smoking history. She has never used smokeless tobacco. She reports that she does not drink alcohol and does not use drugs.   Family History   Her family history includes Breast cancer in her mother; Colon cancer (age of onset: 17) in her brother; Heart attack in her sister and sister; Heart disease in her father; Hypertension in her brother and mother; Stroke in her mother.   Allergies Allergies  Allergen Reactions  . Codeine Palpitations  . Contrast Media [Iodinated Diagnostic Agents] Rash     Home Medications  Prior to Admission medications   Medication Sig Start Date End Date Taking? Authorizing Provider  acetaminophen (TYLENOL) 500 MG tablet Take 2 tablets (1,000 mg total) by mouth every 8 (eight) hours as needed for mild pain or  moderate pain. 12/17/19 12/16/20  Mercy Riding, MD  albuterol (ACCUNEB) 0.63 MG/3ML nebulizer solution Take 1 ampule by nebulization every 6 (six) hours as needed for wheezing.    [provider]  albuterol (PROVENTIL HFA;VENTOLIN HFA) 108 (90 Base) MCG/ACT inhaler Inhale 2 puffs into the lungs every 6 (six) hours as needed. 11/02/16   Schaevitz, Randall An, MD  allopurinol (ZYLOPRIM) 300 MG tablet Take 300 mg by mouth 2 (two) times daily.    [provider]  aspirin 81 MG EC tablet Take 81 mg by mouth daily.    [provider]  budesonide-formoterol (SYMBICORT) 160-4.5 MCG/ACT inhaler Inhale 2 puffs into the lungs 2 (two) times daily.    [provider]  digoxin (LANOXIN) 0.125 MG tablet Take 1 tablet (0.125 mg total) by mouth every Monday, Wednesday, and Friday. 06/19/20   Wellington Hampshire, MD  donepezil (ARICEPT)  5 MG tablet Take 5 mg by mouth at bedtime.    [provider]  furosemide (LASIX) 20 MG tablet Take 1 tablet (20 mg total) by mouth daily as needed for fluid or edema. 06/19/20 07/19/20  Wellington Hampshire, MD  gabapentin (NEURONTIN) 300 MG capsule Take 300 mg by mouth 2 (two) times daily as needed (RLS symptoms/ pain).     [provider]  hydrOXYzine (VISTARIL) 25 MG capsule Take 25 mg by mouth at bedtime as needed for anxiety.    [provider]  levothyroxine (SYNTHROID) 112 MCG tablet Take 112 mcg by mouth daily before breakfast.    [provider]  Menthol, Topical Analgesic, (BENGAY EX) Apply 1 application topically daily as needed (back pain).    [provider]  metFORMIN (GLUCOPHAGE-XR) 500 MG 24 hr tablet Take 500 mg by mouth 2 (two) times daily with a meal.     [provider]  metoprolol succinate (TOPROL-XL) 25 MG 24 hr tablet Take 25 mg by mouth daily.    [provider]  omeprazole (PRILOSEC) 20 MG capsule Take 1 capsule (20 mg total) by mouth every morning. 04/12/18    Gladstone Lighter, MD  Sennosides (EX-LAX PO) Take 2 tablets by mouth at bedtime as needed (constipation).    [provider]  tiotropium (SPIRIVA) 18 MCG inhalation capsule Place 1 capsule (18 mcg total) into inhaler and inhale daily as needed (shortness of breath). Patient taking differently: Place 18 mcg into inhaler and inhale daily as needed (shortness of breath). 04/12/18   Gladstone Lighter, MD  venlafaxine (EFFEXOR) 75 MG tablet Take 75 mg by mouth 2 (two) times daily.    [provider]  vitamin B-12 (CYANOCOBALAMIN) 1000 MCG tablet Take 1,000 mcg by mouth 2 (two) times daily.     [provider]    Scheduled Meds: .  stroke: mapping our early stages of recovery book   Does not apply Once  . labetalol  20 mg Intravenous Once  . pantoprazole (PROTONIX) IV  40 mg Intravenous QHS   Continuous Infusions: . sodium chloride    . clevidipine     PRN Meds:.acetaminophen **OR** acetaminophen (TYLENOL) oral liquid 160 mg/5 mL **OR** acetaminophen, docusate sodium, polyethylene glycol, senna-docusate   Active Hospital Problem list   Acute ischemic stroke COPD Chronic systolic heart failure Atrial fibrillation Diabetes mellitus type 2 Hypothyroidism  Assessment & Plan:   Acute ischemic stroke - patient presenting with slurred speech, left sided weakness and left facial droop Stroke risk factors: Atrial fibrillation currently not on anticoagulation, HLD, HTN Initial NIH stroke scale: 6 S/p IV alteplase needle Time: 06/20/2020 22:52:21  - Admit to ICU - Vital signs/blood pressure with neuro checks/NIHSS  per tPA protocol for the first 24 hours  - No lines, catheters, antiplatelet or anticoagulants x24 hr after IV tPA unless required  - keep BP <185/100 with short-acting antihypertensives (Labetolol, Hydralazine or Nicardipine Gtt as needed).  - Unable to obtain MRI/MRA brain per stroke protocol due to pacemaker in place - Order TTE to r/o cardioembolic  source, assess EF  - CTA head and Neck with no LVO - Check fasting Lipid Panel with Direct LDL in AM.  - Check HgA1c, TSH  - Will repeat head CT in 24 hours s/p IV Alteplase per protocol. - Place SCDs for DVT prevention. - NPO for now until passes swallow screen  - PT consult, OT consult, Speech consult - Aspiration precautions, Bleeding precautions - Obtain STAT head  CT for any new acute headache or new neurological deficits - Neurology consult   Acute on chronic COPD Exacerbation - patient with hx of COPD on home oxygen at 4L.  - Supplemental O2, goal sat 88-92% - Bronchodilators (albuterol/ipratropium) standing and PRN     Chronic  systolic heart failure (last known EF 35 to 40%) -Hypertension Hx: nonischemic cardiomyopathy status post ICD placement, A-fib, HLD  - Continuous cardiac monitoring - Maintain MAP greater than 65 - Lasix as blood pressure and renal function permits; currently on Lasix 20 mg at home - Continue digoxin, metoprolol.  Once able to tolerate p.o. - Repeat 2D Echocardiogram as above   Chronic atrial fibrillation -rate controlled - Currently not on anticoagulation - We will hold anticoagulation in the setting of acute stroke as above status post IV alteplase     Diabetes mellitus - CBGs - Sliding scale insulin - Follow ICU hyper/hypoglycemia protocol - Hold home Metformin & Amaryl   Hypothyroidism -Continue Synthroid    Best practice:  Diet:  NPO Pain/Anxiety/Delirium protocol (if indicated): No VAP protocol (if indicated): Not indicated DVT prophylaxis: Contraindicated GI prophylaxis: PPI Glucose control:  SSI Yes Central venous access:  N/A Arterial line:  N/A Foley:  N/A Mobility:  bed rest  PT consulted: Yes Last date of multidisciplinary goals of care discussion [5/18] Code Status:  full code Disposition: ICU  Critical care time: [place], FNP-C, AGACNP-BC Acute Care Nurse Practitioner  [place] Pager: [phone] [place] at [facility]  .

## 2020-06-21 NOTE — Progress Notes (Signed)
eLink Physician-Brief Progress Note Patient Name: Stacy Grimes DOB: 04/01/1950 MRN: [id number]   Date of Service  06/21/2020  HPI/Events of Note  Patient admitted with a diagnosis of acute ischemic stroke and she is s/p TPA, she has a history of Afib and was not on anticoagulation.  eICU Interventions  New Patient Evaluation.        Kerry Kass [name] 06/21/2020, 2:59 AM

## 2020-06-21 NOTE — Progress Notes (Signed)
OT Cancellation Note  Patient Details Name: Stacy Grimes MRN: [id number] DOB: 07/25/1950   Cancelled Treatment:    Reason Eval/Treat Not Completed: Medical issues which prohibited therapy  OT consult received and chart reviewed. Upon chart review, noted that IV alteplase administered 06/20/2020 22:52:21. OT to hold for 24 hours s/p administration of this medication and after f/u imaging reviewed. Will attempt to see pt at a future date/time as able/appropriate. Thank you.  Gerrianne Scale, [place], OTR/L ascom [phone] 06/21/20, 10:35 AM

## 2020-06-21 NOTE — Progress Notes (Signed)
GOALS OF CARE DISCUSSION  The Clinical status was relayed to family in detail. Husband at bedside  Updated and notified of patients medical condition.    Patient more alert and awake, s/p TPA slurred speech slowly improving, patient is restless Explained to family course of therapy and the modalities   Will await neurology recs  Family understands the situation.   Family are satisfied with Plan of action and management. All questions answered  Additional CC time 35 mins   Keiaira Donlan Patricia Pesa, M.D.  [facility]  Medical Director [place] Director [facility] Cardio-Pulmonary Department

## 2020-06-21 NOTE — Progress Notes (Addendum)
NEURO HOSPITALIST CONSULT NOTE   Requesting physician: Dr. Mortimer Fries  Reason for Consult: Stroke status-post IV tPA administration  History obtained from:  Patient, Husband and Chart    HPI:                                                                                                                                          [name] is an 70 y.o. female with a PMHx of atrial fibrillation (Addendum 5/20: possible atrial fibrillation was per history provided by husband; however, Cardiology review of chart and pacer downloads revealed only atrial tachycardia), nonischemic cardiomyopathy, CHF, AICD, HTN, DM, hypothyroidism, COPD, sleep apnea and mild dementia who was seen overnight by Teleneurology for acute stroke symptoms consisting of slurred speech, difficulty with word finding, left facial droop and left arm weakness. NIHSS was 6. Premorbid mRS was 3.  CT showed no  acute infarction or hemorrhage. She was administered IV tPA for suspected brainstem versus right internal capsule acute ischemic infarction and admitted to the ICU. CTA of head and neck showed no LVO.   Teleneurology consult note has been reviewed: "70yo woman with history of CHF s/p pacer, afib not on anticoagulation, HTN, DM, mild dementia presenting as a stroke alert for left sided weakness and slurred speech. Initially patient arrived via EMS with no family available, attempted to call patient's husband at both listed numbers but no response. He later arrived to ED waiting area and was able to confirm history with him after short delay. He reports she was last well with normal speech at 830pm. About 840p he noticed 'slurry' speech and left arm weakness and called EMS. On my evaluation she has moderate dysarthria, L facial droop, left arm drift and hand weakness, possibly some ataxia on FTN in the R arm, not able to say the month or her age reliably. She is only on aspirin. No recent surgeries, no history of  bleeding (either GIB or ICH) per husband. No known history of stroke per husband."  Home medications include ASA and Aricept.   EKG:  Atrial-sensed ventricular-paced rhythm No further analysis attempted due to paced rhythm  Past Medical History:  Diagnosis Date  . AAA (abdominal aortic aneurysm) ([place])   . Anginal pain ([place])   . Anxiety   . Arthritis   . Automatic implantable cardioverter-defibrillator in situ   . Biventricular ICD  [employer]   . COPD (chronic obstructive pulmonary disease) ([place])   . Deafness    left ear  . Dementia ([place])   . Depression   . Dizziness   . Dysrhythmia   . Fracture    history of spinal fracture  . GERD (gastroesophageal reflux disease)   . HFrEF (heart failure with reduced ejection fraction) ([place])    a.  2010 Cath: nonobs dzs, EF 15-20%; b. 09/2017 Echo: EF suboptimal. EF low nl; c. 04/2018 Echo: EF 40-45%.  . Hypertension   . Hypothyroidism   . Non-ischemic cardiomyopathy ([place])    a. 2010 Cath: nonobs dzs, EF 15-20%; b. 2011 s/p SJM CRT-D; c. 09/2016 s/p Gen change; d. 09/2017 Echo: EF suboptimal. EF low nl; e. 04/2018 Echo: EF 40-45%.  . Oxygen dependent   . Palpitations   . Shortness of breath dyspnea   . Sleep apnea   . Wheezing     Past Surgical History:  Procedure Laterality Date  . ABDOMINAL HYSTERECTOMY    . BIV ICD GENERATOR CHANGEOUT N/A 09/17/2016   Procedure: BiV ICD Generator Changeout;  Surgeon: Deboraha Sprang, MD;  Location: [facility];  Service: Cardiovascular;  Laterality: N/A;  . BLADDER SURGERY    . CARDIAC CATHETERIZATION    . CARDIAC DEFIBRILLATOR PLACEMENT  4 yrs ago   pacemaker  . CATARACT EXTRACTION W/PHACO Left 09/01/2014   Procedure: CATARACT EXTRACTION PHACO AND INTRAOCULAR LENS PLACEMENT (IOC);  Surgeon: Lyla Glassing, MD;  Location: [facility];  Service: Ophthalmology;  Laterality: Left;  [place]: 1:12.1   . CATARACT EXTRACTION W/PHACO Right 09/29/2014   Procedure: CATARACT EXTRACTION PHACO AND INTRAOCULAR LENS  PLACEMENT (IOC);  Surgeon: Lyla Glassing, MD;  Location: [facility];  Service: Ophthalmology;  Laterality: Right;  [place]: 00:52.7   . CHOLECYSTECTOMY    . COLON SURGERY    . COLONOSCOPY    . COLONOSCOPY N/A 08/31/2012   Procedure: COLONOSCOPY;  Surgeon: Inda Castle, MD;  Location: [facility];  Service: Endoscopy;  Laterality: N/A;  . ESOPHAGOGASTRODUODENOSCOPY N/A 07/06/2012   Procedure: ESOPHAGOGASTRODUODENOSCOPY (EGD);  Surgeon: Lafayette Dragon, MD;  Location: [facility];  Service: Endoscopy;  Laterality: N/A;  . KNEE SURGERY Right   . MIDDLE EAR SURGERY    . SAVORY DILATION N/A 07/06/2012   Procedure: SAVORY DILATION;  Surgeon: [place] Dragon, MD;  Location: [facility];  Service: Endoscopy;  Laterality: N/A;  . WRIST SURGERY Right     Family History  Problem Relation Age of Onset  . Hypertension Mother   . Breast cancer Mother   . Stroke Mother   . Heart disease Father   . Heart attack Sister   . Hypertension Brother   . Heart attack Sister   . Colon cancer Brother 78              Social History:  reports that she quit smoking about 18 years ago. Her smoking use included cigarettes. She has a 60.00 pack-year smoking history. She has never used smokeless tobacco. She reports that she does not drink alcohol and does not use drugs.  Allergies  Allergen Reactions  . Codeine Palpitations  . Contrast Media [Iodinated Diagnostic Agents] Rash    MEDICATIONS:  Prior to Admission:  Medications Prior to Admission  Medication Sig Dispense Refill Last Dose  . acetaminophen (TYLENOL) 500 MG tablet Take 2 tablets (1,000 mg total) by mouth every 8 (eight) hours as needed for mild pain or moderate pain. 40 tablet 0   . albuterol (ACCUNEB) 0.63 MG/3ML nebulizer solution Take 1 ampule by nebulization every 6 (six) hours as needed for wheezing.     Marland Kitchen albuterol (PROVENTIL  HFA;VENTOLIN HFA) 108 (90 Base) MCG/ACT inhaler Inhale 2 puffs into the lungs every 6 (six) hours as needed. 1 Inhaler 0   . allopurinol (ZYLOPRIM) 300 MG tablet Take 300 mg by mouth 2 (two) times daily.     Marland Kitchen aspirin 81 MG EC tablet Take 81 mg by mouth daily.     . budesonide-formoterol (SYMBICORT) 160-4.5 MCG/ACT inhaler Inhale 2 puffs into the lungs 2 (two) times daily.     . digoxin (LANOXIN) 0.125 MG tablet Take 1 tablet (0.125 mg total) by mouth every Monday, Wednesday, and Friday. 36 tablet 0   . donepezil (ARICEPT) 5 MG tablet Take 5 mg by mouth at bedtime.     . furosemide (LASIX) 20 MG tablet Take 1 tablet (20 mg total) by mouth daily as needed for fluid or edema. 90 tablet 0   . gabapentin (NEURONTIN) 300 MG capsule Take 300 mg by mouth 2 (two) times daily as needed (RLS symptoms/ pain).      . hydrOXYzine (VISTARIL) 25 MG capsule Take 25 mg by mouth at bedtime as needed for anxiety.     Marland Kitchen levothyroxine (SYNTHROID) 112 MCG tablet Take 112 mcg by mouth daily before breakfast.     . Menthol, Topical Analgesic, (BENGAY EX) Apply 1 application topically daily as needed (back pain).     . metFORMIN (GLUCOPHAGE-XR) 500 MG 24 hr tablet Take 500 mg by mouth 2 (two) times daily with a meal.      . metoprolol succinate (TOPROL-XL) 25 MG 24 hr tablet Take 25 mg by mouth daily.     Marland Kitchen omeprazole (PRILOSEC) 20 MG capsule Take 1 capsule (20 mg total) by mouth every morning. 30 capsule 2   . Sennosides (EX-LAX PO) Take 2 tablets by mouth at bedtime as needed (constipation).     Marland Kitchen tiotropium (SPIRIVA) 18 MCG inhalation capsule Place 1 capsule (18 mcg total) into inhaler and inhale daily as needed (shortness of breath). (Patient taking differently: Place 18 mcg into inhaler and inhale daily as needed (shortness of breath).) 30 capsule 12   . venlafaxine (EFFEXOR) 75 MG tablet Take 75 mg by mouth 2 (two) times daily.     . vitamin B-12 (CYANOCOBALAMIN) 1000 MCG tablet Take 1,000 mcg by mouth 2 (two) times  daily.       Scheduled: . Chlorhexidine Gluconate Cloth  6 each Topical Q0600  . insulin aspart  0-15 Units Subcutaneous TID WC  . insulin aspart  0-5 Units Subcutaneous QHS  . labetalol  20 mg Intravenous Once  . pantoprazole (PROTONIX) IV  40 mg Intravenous QHS   Continuous: . clevidipine       ROS:  No current complaints. Other ROS as per HPI.    Blood pressure 138/78, pulse (!) 102, temperature 98.6 F (37 C), temperature source Oral, resp. rate (!) 24, weight 89.6 kg, SpO2 98 %.   General Examination:                                                                                                       Physical Exam  HEENT-  [place]/AT    Lungs- Respirations unlabored Extremities- No edema  Neurological Examination Mental Status: Awake and alert. Attention is fair. Speech is fluent with intact comprehension and naming. Oriented to self, situation, city and state, but could not correctly recall the day, month or year.  Cranial Nerves: II: Temporal visual fields intact with no extinction to DSS. PERRL.  III,IV, VI: No ptosis. EOMI.  V,VII: Mild left facial droop.  VIII: Hearing intact to voice IX,X: No hypophonia XI: Slight lag on the left with shoulder shrug XII: Midline tongue extension Motor: LUE 4+/5 RUE 5/5 BLE 5/5 Sensory: Temp and light touch intact in upper and lower extremities bilaterally. No extinction to DSS.  Deep Tendon Reflexes: Normoactive x 4 Plantars: Right: downgoing   Left: downgoing Cerebellar: No ataxia with FNF bilaterally  Gait: Deferred   Lab Results: Basic Metabolic Panel: Recent Labs  Lab 06/20/20 2218 06/21/20 0408  NA 141 140  K 3.8 4.0  CL 98 99  CO2 33* 31  GLUCOSE 131* 196*  BUN 20 19  CREATININE 0.89 0.85  CALCIUM 9.1 8.8*  MG  --  2.0  PHOS  --  3.2    CBC: Recent Labs  Lab  06/20/20 2218 06/21/20 0408  WBC 9.5 16.6*  NEUTROABS 5.7  --   HGB 10.7* 10.9*  HCT 36.1 37.2  MCV 87.4 86.9  PLT 331 337    Cardiac Enzymes: No results for input(s): CKTOTAL, CKMB, CKMBINDEX, TROPONINI in the last 168 hours.  Lipid Panel: Recent Labs  Lab 06/21/20 0408  CHOL 221*  TRIG 109  HDL 60  CHOLHDL 3.7  VLDL 22  LDLCALC 139*    Imaging: CT HEAD CODE STROKE WO CONTRAST  Result Date: 06/20/2020 CLINICAL DATA:  Code stroke.  Slurred speech and left facial droop EXAM: CT HEAD WITHOUT CONTRAST TECHNIQUE: Contiguous axial images were obtained from the base of the skull through the vertex without intravenous contrast. COMPARISON:  None. FINDINGS: Brain: There is no mass, hemorrhage or extra-axial collection. The size and configuration of the ventricles and extra-axial CSF spaces are normal. There is hypoattenuation of the white matter, most commonly indicating chronic small vessel disease. Vascular: No abnormal hyperdensity of the major intracranial arteries or dural venous sinuses. No intracranial atherosclerosis. Skull: The visualized skull base, calvarium and extracranial soft tissues are normal. Sinuses/Orbits: No fluid levels or advanced mucosal thickening of the visualized paranasal sinuses. No mastoid or middle ear effusion. The orbits are normal. ASPECTS [facility] Stroke Program Early CT Score) - Ganglionic level infarction (caudate, lentiform nuclei, internal capsule, insula, M1-M3 cortex): 7 - Supraganglionic infarction (M4-M6 cortex): 3 Total score (0-10 with 10  being normal): 10 IMPRESSION: Chronic small vessel disease without acute intracranial abnormality. ASPECTS is 10. These results were called by telephone at the time of interpretation on 06/20/2020 at 10:32 pm to provider PHILLIP STAFFORD , who verbally acknowledged these results. Electronically Signed   By: Ulyses Jarred M.D.   On: 06/20/2020 22:32   CT ANGIO HEAD NECK W WO CM W PERF (CODE STROKE)  Result Date:  06/21/2020 CLINICAL DATA:  Slurred speech with left facial droop EXAM: CT ANGIOGRAPHY HEAD AND NECK CT PERFUSION BRAIN TECHNIQUE: Multidetector CT imaging of the head and neck was performed using the standard protocol during bolus administration of intravenous contrast. Multiplanar CT image reconstructions and MIPs were obtained to evaluate the vascular anatomy. Carotid stenosis measurements (when applicable) are obtained utilizing NASCET criteria, using the distal internal carotid diameter as the denominator. Multiphase CT imaging of the brain was performed following IV bolus contrast injection. Subsequent parametric perfusion maps were calculated using RAPID software. CONTRAST:  162mL OMNIPAQUE IOHEXOL 350 MG/ML SOLN COMPARISON:  Head CT 06/20/2020 FINDINGS: CTA NECK FINDINGS SKELETON: There is no bony spinal canal stenosis. No lytic or blastic lesion. OTHER NECK: Normal pharynx, larynx and major salivary glands. No cervical lymphadenopathy. Unremarkable thyroid gland. UPPER CHEST: No pneumothorax or pleural effusion. No nodules or masses. AORTIC ARCH: There is calcific atherosclerosis of the aortic arch. There is no aneurysm, dissection or hemodynamically significant stenosis of the visualized portion of the aorta. Conventional 3 vessel aortic branching pattern. The visualized proximal subclavian arteries are widely patent. RIGHT CAROTID SYSTEM: Normal without aneurysm, dissection or stenosis. LEFT CAROTID SYSTEM: Normal without aneurysm, dissection or stenosis. VERTEBRAL ARTERIES: Left dominant configuration. Both origins are clearly patent. There is no dissection, occlusion or flow-limiting stenosis to the skull base (V1-V3 segments). CTA HEAD FINDINGS POSTERIOR CIRCULATION: --Vertebral arteries: Normal V4 segments. --Inferior cerebellar arteries: Normal. --Basilar artery: Normal. --Superior cerebellar arteries: Normal. --Posterior cerebral arteries (PCA): Normal. ANTERIOR CIRCULATION: --Intracranial internal  carotid arteries: Normal. --Anterior cerebral arteries (ACA): Normal. Both A1 segments are present. Patent anterior communicating artery (a-comm). --Middle cerebral arteries (MCA): Normal. VENOUS SINUSES: As permitted by contrast timing, patent. ANATOMIC VARIANTS: None Review of the MIP images confirms the above findings. CT Brain Perfusion Findings: ASPECTS: 10 CBF (<30%) Volume: 28mL Perfusion (Tmax>6.0s) volume: 64mL Mismatch Volume: 70mL Infarction Location:None IMPRESSION: 1. No emergent large vessel occlusion or high-grade stenosis of the intracranial arteries. 2. Normal CT perfusion. Aortic Atherosclerosis (ICD10-I70.0). Electronically Signed   By: Ulyses Jarred M.D.   On: 06/21/2020 00:07    Assessment: 70 year old female with a history of mild dementia and several stroke risk factors, who presented to the ED yesterday with acute stroke symptoms consisting of slurred speech, difficulty with word finding, left facial droop and left arm weakness. NIHSS was 6. CT showed no acute infarction or hemorrhage. She was administered IV tPA for suspected brainstem versus right internal capsule acute ischemic infarction and admitted to the ICU. 1. Exam reveals mild left facial droop and subtle LUE motor deficit. She also is poorly oriented and with fair attention, consistent with her history of dementia.  2. CT head: Chronic small vessel disease without acute intracranial abnormality. ASPECTS is 10. 3. CTA of head and neck: No emergent large vessel occlusion or high-grade stenosis of the intracranial arteries. 4. Normal CT perfusion. 5. Stroke risk factors: atrial fibrillation, CHF, HTN, DM and sleep apnea 6. EKG: Atrial-sensed ventricular-paced rhythm. No further analysis attempted due to paced rhythm.  Recommendations: 1. Unable to  obtain MRI/MRA brain due to pacemaker in place 2. Post-tPA orders for BP control and frequent neuro checks. 3. No antiplatelet medications or anticoagulants until at least 24  hours following tPA. Will also need repeat CT at 24 hours following tPA to assess for interval stability. If hemorrhagic conversion is present, will need to continue holding off on antiplatelet medication and/or anticoagulation.  4. DVT prophylaxis with SCDs.  5. Most likely will be unable to obtain MRI brain due to her AICD.  6. TTE 7. Cardiac telemetry 8. PT/OT/Speech 9. Increasing Aricept to 10 mg po qhs.  10. If CT head 24 hours after tPA is negative for hemorrhagic conversion, then initiation of anticoagulation would be indicated given the patient's atrial fibrillation. No contraindications listed in Epic, but would ask the patient's husband why the patient was not on anticoagulation prior to admission.   Addendum (5/20): - Atrial fibrillation described by husband not substantiated by chart review or pacer downloads evaluated by Cardiology.  - Anticoagulation may, however, be indicated due to severely reduced EF (Left ventricular ejection fraction, by estimation, is <20% on TTE performed 5/19).   Electronically signed: Dr. Kerney Elbe  06/21/2020, 9:54 AM

## 2020-06-21 NOTE — Progress Notes (Signed)
PT Cancellation Note  Patient Details Name: Stacy Grimes MRN: [id number] DOB: 1951/01/21   Cancelled Treatment:    Reason Eval/Treat Not Completed: Medical issues which prohibited therapy:  IV alteplase administered 06/20/2020 22:52:21. Per policy PT services to be held 24 hrs from administration time and after follow up imaging reviewed.  Will attempt to see pt at a future date/time as medically appropriate.      Linus Salmons PT, DPT 06/21/20, 9:09 AM

## 2020-06-21 NOTE — Progress Notes (Signed)
Patient arrived to unit alert & oriented x 2. VSS. NIH Scale Score 3 with a mild left side facial droop, left arm & left leg drift. Pleasant mood noted. No c/o pain or distress. No signs of bleeding noted.

## 2020-06-22 ENCOUNTER — Inpatient Hospital Stay (HOSPITAL_COMMUNITY)
Admit: 2020-06-22 | Discharge: 2020-06-22 | Disposition: A | Discharge status: Home / Self Care | Payer: Medicare Other | Attending: Nurse Practitioner | Admitting: Nurse Practitioner

## 2020-06-22 DIAGNOSIS — J449 Chronic obstructive pulmonary disease, unspecified: Secondary | ICD-10-CM

## 2020-06-22 DIAGNOSIS — I6389 Other cerebral infarction: Secondary | ICD-10-CM

## 2020-06-22 DIAGNOSIS — I428 Other cardiomyopathies: Secondary | ICD-10-CM

## 2020-06-22 DIAGNOSIS — R410 Disorientation, unspecified: Secondary | ICD-10-CM

## 2020-06-22 DIAGNOSIS — I509 Heart failure, unspecified: Secondary | ICD-10-CM

## 2020-06-22 DIAGNOSIS — E1165 Type 2 diabetes mellitus with hyperglycemia: Secondary | ICD-10-CM

## 2020-06-22 LAB — ECHOCARDIOGRAM COMPLETE
S' Lateral: 5.54 cm
Weight: 3149.93 oz

## 2020-06-22 LAB — GLUCOSE, CAPILLARY
Glucose-Capillary: 126 mg/dL — ABNORMAL HIGH (ref 70–99)
Glucose-Capillary: 136 mg/dL — ABNORMAL HIGH (ref 70–99)
Glucose-Capillary: 112 mg/dL — ABNORMAL HIGH (ref 70–99)
Glucose-Capillary: 124 mg/dL — ABNORMAL HIGH (ref 70–99)

## 2020-06-22 MED ORDER — FUROSEMIDE 20 MG PO TABS: 20.0000 mg | ORAL_TABLET | Freq: Every day | ORAL | Status: DC | PRN

## 2020-06-22 MED ORDER — VITAMIN B-12 1000 MCG PO TABS
1000.0000 ug | ORAL_TABLET | Freq: Two times a day (BID) | ORAL | Status: DC
  Administered 2020-06-22: 1000 ug via ORAL
  Filled 2020-06-22: qty 1

## 2020-06-22 MED ORDER — MOMETASONE FURO-FORMOTEROL FUM 200-5 MCG/ACT IN AERO
2.0000 | INHALATION_SPRAY | Freq: Two times a day (BID) | RESPIRATORY_TRACT | Status: DC
  Administered 2020-06-22 – 2020-06-23 (×2): 2 via RESPIRATORY_TRACT
  Filled 2020-06-22: qty 8.8

## 2020-06-22 MED ORDER — ATORVASTATIN CALCIUM 20 MG PO TABS
40.0000 mg | ORAL_TABLET | Freq: Every day | ORAL | Status: DC
  Administered 2020-06-22: 40 mg via ORAL
  Filled 2020-06-22: qty 2

## 2020-06-22 MED ORDER — VENLAFAXINE HCL 37.5 MG PO TABS
75.0000 mg | ORAL_TABLET | Freq: Two times a day (BID) | ORAL | Status: DC
  Administered 2020-06-22: 75 mg via ORAL
  Filled 2020-06-22 (×3): qty 2

## 2020-06-22 MED ORDER — LEVOTHYROXINE SODIUM 112 MCG PO TABS
112.0000 ug | ORAL_TABLET | Freq: Every day | ORAL | Status: DC
  Administered 2020-06-23: 112 ug via ORAL
  Filled 2020-06-22: qty 1

## 2020-06-22 MED ORDER — SODIUM CHLORIDE 0.9 % IV SOLN: INTRAVENOUS | Status: DC

## 2020-06-22 MED ORDER — METOPROLOL SUCCINATE ER 50 MG PO TB24
25.0000 mg | ORAL_TABLET | Freq: Every day | ORAL | Status: DC
  Administered 2020-06-22: 25 mg via ORAL
  Filled 2020-06-22: qty 1

## 2020-06-22 MED ORDER — GABAPENTIN 300 MG PO CAPS: 300.0000 mg | ORAL_CAPSULE | Freq: Two times a day (BID) | ORAL | Status: DC | PRN

## 2020-06-22 MED ORDER — ALLOPURINOL 300 MG PO TABS
300.0000 mg | ORAL_TABLET | Freq: Two times a day (BID) | ORAL | Status: DC
  Administered 2020-06-22: 300 mg via ORAL
  Filled 2020-06-22: qty 1

## 2020-06-22 MED ORDER — DIGOXIN 125 MCG PO TABS
0.1250 mg | ORAL_TABLET | ORAL | Status: DC
  Filled 2020-06-22: qty 1

## 2020-06-22 MED ORDER — MELATONIN 5 MG PO TABS
5.0000 mg | ORAL_TABLET | Freq: Every day | ORAL | Status: DC
  Administered 2020-06-22: 5 mg via ORAL
  Filled 2020-06-22: qty 1

## 2020-06-22 NOTE — Progress Notes (Signed)
[facility] NAME: Stacy Grimes    MR#:  [id number]  DATE OF BIRTH:  02/16/1950  SUBJECTIVE:   Patient says her speech has improved and husband confirms at bedside. Worked with PT earlier and did very well. REVIEW OF SYSTEMS:   Review of Systems  Constitutional: Negative for chills, fever and weight loss.  HENT: Negative for ear discharge, ear pain and nosebleeds.   Eyes: Negative for blurred vision, pain and discharge.  Respiratory: Negative for sputum production, shortness of breath, wheezing and stridor.   Cardiovascular: Negative for chest pain, palpitations, orthopnea and PND.  Gastrointestinal: Negative for abdominal pain, diarrhea, nausea and vomiting.  Genitourinary: Negative for frequency and urgency.  Musculoskeletal: Negative for back pain and joint pain.  Neurological: Positive for speech change and weakness. Negative for sensory change and focal weakness.  Psychiatric/Behavioral: Negative for depression and hallucinations. The patient is not nervous/anxious.    Tolerating Diet:yes Tolerating PT: outpatient PT  DRUG ALLERGIES:   Allergies  Allergen Reactions  . Codeine Palpitations  . Contrast Media [Iodinated Diagnostic Agents] Rash    VITALS:  Blood pressure (!) 150/77, pulse 92, temperature 98 F (36.7 C), temperature source Oral, resp. rate 20, weight 89.3 kg, SpO2 96 %.  PHYSICAL EXAMINATION:   Physical Exam  GENERAL:  70 y.o.-year-old patient lying in the bed with no acute distress.    LUNGS: Normal breath sounds bilaterally, no wheezing, rales, rhonchi. No use of accessory muscles of respiration.  CARDIOVASCULAR: S1, S2 normal. No murmurs, rubs, or gallops.  ABDOMEN: Soft, nontender, nondistended. Bowel sounds present. No organomegaly or mass.  EXTREMITIES: No cyanosis, clubbing or edema b/l.    NEUROLOGIC: left facial droop, mild slurred speech. No focal weakness PSYCHIATRIC:  patient is alert and  oriented x 3.  SKIN: No obvious rash, lesion, or ulcer.   LABORATORY PANEL:  CBC Recent Labs  Lab 06/21/20 0408  WBC 16.6*  HGB 10.9*  HCT 37.2  PLT 337    Chemistries  Recent Labs  Lab 06/20/20 2218 06/21/20 0408  NA 141 140  K 3.8 4.0  CL 98 99  CO2 33* 31  GLUCOSE 131* 196*  BUN 20 19  CREATININE 0.89 0.85  CALCIUM 9.1 8.8*  MG  --  2.0  AST 15  --   ALT 10  --   ALKPHOS 92  --   BILITOT 0.4  --    Cardiac Enzymes No results for input(s): TROPONINI in the last 168 hours. RADIOLOGY:  CT HEAD WO CONTRAST  Result Date: 06/21/2020 CLINICAL DATA:  Stroke follow-up EXAM: CT HEAD WITHOUT CONTRAST TECHNIQUE: Contiguous axial images were obtained from the base of the skull through the vertex without intravenous contrast. COMPARISON:  None. FINDINGS: Brain: There is no mass, hemorrhage or extra-axial collection. The size and configuration of the ventricles and extra-axial CSF spaces are normal. There is hypoattenuation of the white matter, most commonly indicating chronic small vessel disease. Vascular: No abnormal hyperdensity of the major intracranial arteries or dural venous sinuses. No intracranial atherosclerosis. Skull: The visualized skull base, calvarium and extracranial soft tissues are normal. Sinuses/Orbits: No fluid levels or advanced mucosal thickening of the visualized paranasal sinuses. No mastoid or middle ear effusion. The orbits are normal. IMPRESSION: Chronic small vessel disease without acute intracranial abnormality. Electronically Signed   By: Ulyses Jarred M.D.   On: 06/21/2020 22:38   CT HEAD CODE STROKE WO CONTRAST  Result Date: 06/20/2020 CLINICAL  DATA:  Code stroke.  Slurred speech and left facial droop EXAM: CT HEAD WITHOUT CONTRAST TECHNIQUE: Contiguous axial images were obtained from the base of the skull through the vertex without intravenous contrast. COMPARISON:  None. FINDINGS: Brain: There is no mass, hemorrhage or extra-axial collection. The size  and configuration of the ventricles and extra-axial CSF spaces are normal. There is hypoattenuation of the white matter, most commonly indicating chronic small vessel disease. Vascular: No abnormal hyperdensity of the major intracranial arteries or dural venous sinuses. No intracranial atherosclerosis. Skull: The visualized skull base, calvarium and extracranial soft tissues are normal. Sinuses/Orbits: No fluid levels or advanced mucosal thickening of the visualized paranasal sinuses. No mastoid or middle ear effusion. The orbits are normal. ASPECTS ([place] Stroke Program Early CT Score) - Ganglionic level infarction (caudate, lentiform nuclei, internal capsule, insula, M1-M3 cortex): 7 - Supraganglionic infarction (M4-M6 cortex): 3 Total score (0-10 with 10 being normal): 10 IMPRESSION: Chronic small vessel disease without acute intracranial abnormality. ASPECTS is 10. These results were called by telephone at the time of interpretation on 06/20/2020 at 10:32 pm to provider PHILLIP STAFFORD , who verbally acknowledged these results. Electronically Signed   By: Ulyses Jarred M.D.   On: 06/20/2020 22:32   CT ANGIO HEAD NECK W WO CM W PERF (CODE STROKE)  Result Date: 06/21/2020 CLINICAL DATA:  Slurred speech with left facial droop EXAM: CT ANGIOGRAPHY HEAD AND NECK CT PERFUSION BRAIN TECHNIQUE: Multidetector CT imaging of the head and neck was performed using the standard protocol during bolus administration of intravenous contrast. Multiplanar CT image reconstructions and MIPs were obtained to evaluate the vascular anatomy. Carotid stenosis measurements (when applicable) are obtained utilizing NASCET criteria, using the distal internal carotid diameter as the denominator. Multiphase CT imaging of the brain was performed following IV bolus contrast injection. Subsequent parametric perfusion maps were calculated using RAPID software. CONTRAST:  156mL OMNIPAQUE IOHEXOL 350 MG/ML SOLN COMPARISON:  Head CT 06/20/2020  FINDINGS: CTA NECK FINDINGS SKELETON: There is no bony spinal canal stenosis. No lytic or blastic lesion. OTHER NECK: Normal pharynx, larynx and major salivary glands. No cervical lymphadenopathy. Unremarkable thyroid gland. UPPER CHEST: No pneumothorax or pleural effusion. No nodules or masses. AORTIC ARCH: There is calcific atherosclerosis of the aortic arch. There is no aneurysm, dissection or hemodynamically significant stenosis of the visualized portion of the aorta. Conventional 3 vessel aortic branching pattern. The visualized proximal subclavian arteries are widely patent. RIGHT CAROTID SYSTEM: Normal without aneurysm, dissection or stenosis. LEFT CAROTID SYSTEM: Normal without aneurysm, dissection or stenosis. VERTEBRAL ARTERIES: Left dominant configuration. Both origins are clearly patent. There is no dissection, occlusion or flow-limiting stenosis to the skull base (V1-V3 segments). CTA HEAD FINDINGS POSTERIOR CIRCULATION: --Vertebral arteries: Normal V4 segments. --Inferior cerebellar arteries: Normal. --Basilar artery: Normal. --Superior cerebellar arteries: Normal. --Posterior cerebral arteries (PCA): Normal. ANTERIOR CIRCULATION: --Intracranial internal carotid arteries: Normal. --Anterior cerebral arteries (ACA): Normal. Both A1 segments are present. Patent anterior communicating artery (a-comm). --Middle cerebral arteries (MCA): Normal. VENOUS SINUSES: As permitted by contrast timing, patent. ANATOMIC VARIANTS: None Review of the MIP images confirms the above findings. CT Brain Perfusion Findings: ASPECTS: 10 CBF (<30%) Volume: 83mL Perfusion (Tmax>6.0s) volume: 21mL Mismatch Volume: 91mL Infarction Location:None IMPRESSION: 1. No emergent large vessel occlusion or high-grade stenosis of the intracranial arteries. 2. Normal CT perfusion. Aortic Atherosclerosis (ICD10-I70.0). Electronically Signed   By: Ulyses Jarred M.D.   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 Patient was found to have slurred speech and left facial droop with weakness.  Acute CVA -- patient presented with left facial drill, slurred speech and weakness. CT head on admission was negative. Tele neurology consultation was placed. Patient is status post TPA. -- CT had repeated today remains stable. Patient shows improvement in her symptoms. -- Seen by physical therapy occupational therapy--- recommends outpatient PT OT -- there is a question of? Atrial fibrillation which I don't see any documentation in the chart. Possibility of brief episode. ICD interrogation pending-- cardiology consultation with Dr. Rockey Situ to see if patient is a candidate for anticoagulation -- seen by neurology-- recommends anticoagulation if okay with cardiology -- continue statins  Non- ischemic cardiomyopathy EF 35 to 03% chronic systolic CHF -- patient appears euvolemic -- resume home meds Lasix, Toprol-XL, digoxin -- no ACE/ARB with history of renal insufficiency  COPD -- stable  Hyperlipidemia -- started on atorvastatin  History of dementia     Procedures: Family communication : discussed with husband at bedside Consults : neurology, cardiology CODE STATUS: full DVT Prophylaxis : Level of care: Stepdown Status is: Inpatient  Remains inpatient appropriate because:Inpatient level of care appropriate due to severity of illness   Dispo: The patient is from: Home              Anticipated d/c is to: Home              Patient currently is not medically stable to d/c.   Difficult to place patient No   Await cardiology consultation and final recommendation. If continues to show improvement likely discharge tomorrow to home with outpatient PT OT     TOTAL TIME TAKING CARE OF THIS  PATIENT: *25** minutes.  >50% time spent on counselling and coordination of care  Note: This dictation was prepared with Dragon dictation along with smaller phrase technology. Any transcriptional errors that result from this process are unintentional.  Fritzi Mandes M.D    [facility]   CC: Primary care physician; Cyndi Bender, PA-CPatient ID: Delice Bison, female   DOB: Jul 15, 1950, 70 y.o.   MRN: [id number]

## 2020-06-22 NOTE — Consult Note (Addendum)
Cardiology Consultation:   Patient ID: TENNYSON KALLEN MRN: [id number]; DOB: 11-Sep-1950  Admit date: 06/20/2020 Date of Consult: 06/22/2020  PCP:  Cyndi Bender, PA-C   [facility] Providers Cardiologist:  Virl Axe, MD  Electrophysiologist:  Virl Axe, MD  {  Patient Profile:   SARAE NICHOLES is a 70 y.o. female with a hx of orthostatic hypotension, HFrEF, NICM s/p ICD placement by Dr. Caryl Comes in 2018, dementia, COPD on home O2 3-4L, HTN, abdominal aortic aneurysm, hypothyroidism, mild dementia, remote tobacco use, OSA who is being seen 06/22/2020 for the evaluation of anticoagulation at the request of Dr. Posey Pronto.  History of Present Illness:   Ms. Macmaster is followed by Dr. Fletcher Anon and Dr. Caryl Comes. H/o of cath in 2010 showing EF 15-20% with no obstructive disease. She had improvement of EF to the low normal range by echo November 2021 with EF 35-40%. Hospitalized 2020 for syncope and was noted to be hypotensive. Meds were held. Felt to be due to dehydration. Started back on diuretic for volume management. No ACE/ARB restarted for hypotension and acute renal failure. On metoprolol and digoxin three times a week.   Patient [facility] to [facility] with slurred speech and left facial droop. Patient was in the bathroom when she yelled for help an dhusband came in and noted changes.   In the ED BP 155/55, pulse 107. CT head was not acute. Code stroke was called and she was given tpa. Labs showed glucose 131, CO2 33, K 3.8, creatinine 0.89, albumin 3.1, WBC 9.5, Hgb 10.7, plt 331. EKG showed A-sensed V-paced rhythm. Patient was admitted to the ICU for further work-up. No MRI with PPM.   Symptoms improved with TPA.   Question whether patient has a history of afib. In chart review I see no documented diagnosis of afib.   Past Medical History:  Diagnosis Date  . AAA (abdominal aortic aneurysm) ([place])   . Anginal pain ([place])   . Anxiety   . Arthritis   . Automatic implantable  cardioverter-defibrillator in situ   . Biventricular ICD  [employer]   . COPD (chronic obstructive pulmonary disease) ([place])   . Deafness    left ear  . Dementia ([place])   . Depression   . Dizziness   . Dysrhythmia   . Fracture    history of spinal fracture  . GERD (gastroesophageal reflux disease)   . HFrEF (heart failure with reduced ejection fraction) ([place])    a. 2010 Cath: nonobs dzs, EF 15-20%; b. 09/2017 Echo: EF suboptimal. EF low nl; c. 04/2018 Echo: EF 40-45%.  . Hypertension   . Hypothyroidism   . Non-ischemic cardiomyopathy ([place])    a. 2010 Cath: nonobs dzs, EF 15-20%; b. 2011 s/p SJM CRT-D; c. 09/2016 s/p Gen change; d. 09/2017 Echo: EF suboptimal. EF low nl; e. 04/2018 Echo: EF 40-45%.  . Oxygen dependent   . Palpitations   . Shortness of breath dyspnea   . Sleep apnea   . Wheezing     Past Surgical History:  Procedure Laterality Date  . ABDOMINAL HYSTERECTOMY    . BIV ICD GENERATOR CHANGEOUT N/A 09/17/2016   Procedure: BiV ICD Generator Changeout;  Surgeon: Deboraha Sprang, MD;  Location: [facility];  Service: Cardiovascular;  Laterality: N/A;  . BLADDER SURGERY    . CARDIAC CATHETERIZATION    . CARDIAC DEFIBRILLATOR PLACEMENT  4 yrs ago   pacemaker  . CATARACT EXTRACTION W/PHACO Left 09/01/2014   Procedure: CATARACT EXTRACTION PHACO  AND INTRAOCULAR LENS PLACEMENT (IOC);  Surgeon: Lyla Glassing, MD;  Location: [facility];  Service: Ophthalmology;  Laterality: Left;  [place]: 1:12.1   . CATARACT EXTRACTION W/PHACO Right 09/29/2014   Procedure: CATARACT EXTRACTION PHACO AND INTRAOCULAR LENS PLACEMENT (IOC);  Surgeon: Lyla Glassing, MD;  Location: [facility];  Service: Ophthalmology;  Laterality: Right;  [place]: 00:52.7   . CHOLECYSTECTOMY    . COLON SURGERY    . COLONOSCOPY    . COLONOSCOPY N/A 08/31/2012   Procedure: COLONOSCOPY;  Surgeon: Inda Castle, MD;  Location: [facility];  Service: Endoscopy;  Laterality: N/A;  . ESOPHAGOGASTRODUODENOSCOPY N/A 07/06/2012    Procedure: ESOPHAGOGASTRODUODENOSCOPY (EGD);  Surgeon: Lafayette Dragon, MD;  Location: [facility];  Service: Endoscopy;  Laterality: N/A;  . KNEE SURGERY Right   . MIDDLE EAR SURGERY    . SAVORY DILATION N/A 07/06/2012   Procedure: SAVORY DILATION;  Surgeon: Lafayette Dragon, MD;  Location: [facility];  Service: Endoscopy;  Laterality: N/A;  . WRIST SURGERY Right      Home Medications:  Prior to Admission medications   Medication Sig Start Date End Date Taking? Authorizing Provider  acetaminophen (TYLENOL) 500 MG tablet Take 2 tablets (1,000 mg total) by mouth every 8 (eight) hours as needed for mild pain or moderate pain. 12/17/19 12/16/20 Yes Mercy Riding, MD  albuterol (ACCUNEB) 0.63 MG/3ML nebulizer solution Take 1 ampule by nebulization every 6 (six) hours as needed for wheezing.   Yes [provider]  albuterol (PROVENTIL HFA;VENTOLIN HFA) 108 (90 Base) MCG/ACT inhaler Inhale 2 puffs into the lungs every 6 (six) hours as needed. 11/02/16  Yes Schaevitz, Randall An, MD  allopurinol (ZYLOPRIM) 300 MG tablet Take 300 mg by mouth 2 (two) times daily.   Yes [provider]  aspirin 81 MG EC tablet Take 81 mg by mouth daily.   Yes [provider]  budesonide-formoterol (SYMBICORT) 160-4.5 MCG/ACT inhaler Inhale 2 puffs into the lungs 2 (two) times daily.   Yes [provider]  digoxin (LANOXIN) 0.125 MG tablet Take 1 tablet (0.125 mg total) by mouth every Monday, Wednesday, and Friday. 06/19/20  Yes Wellington Hampshire, MD  donepezil (ARICEPT) 5 MG tablet Take 5 mg by mouth at bedtime.   Yes [provider]  furosemide (LASIX) 20 MG tablet Take 1 tablet (20 mg total) by mouth daily as needed for fluid or edema. 06/19/20 07/19/20 Yes Wellington Hampshire, MD  gabapentin (NEURONTIN) 300 MG capsule Take 300 mg by mouth 2 (two) times daily as needed (RLS symptoms/ pain).    Yes [provider]  hydrOXYzine (VISTARIL) 25 MG capsule Take 25 mg by mouth at  bedtime as needed for anxiety.   Yes [provider]  levothyroxine (SYNTHROID) 112 MCG tablet Take 112 mcg by mouth daily before breakfast.   Yes [provider]  Menthol, Topical Analgesic, (BENGAY EX) Apply 1 application topically daily as needed (back pain).   Yes [provider]  metFORMIN (GLUCOPHAGE-XR) 500 MG 24 hr tablet Take 500 mg by mouth 2 (two) times daily with a meal.    Yes [provider]  metoprolol succinate (TOPROL-XL) 25 MG 24 hr tablet Take 25 mg by mouth daily.   Yes [provider]  omeprazole (PRILOSEC) 20 MG capsule Take 1 capsule (20 mg total) by mouth every morning. 04/12/18  Yes Gladstone Lighter, MD  tiotropium (SPIRIVA) 18 MCG inhalation capsule Place 1 capsule (18 mcg total) into inhaler and inhale daily as  needed (shortness of breath). Patient taking differently: Place 18 mcg into inhaler and inhale daily as needed (shortness of breath). 04/12/18  Yes Gladstone Lighter, MD  venlafaxine (EFFEXOR) 75 MG tablet Take 75 mg by mouth 2 (two) times daily.   Yes [provider]  vitamin B-12 (CYANOCOBALAMIN) 1000 MCG tablet Take 1,000 mcg by mouth 2 (two) times daily.    Yes [provider]  Sennosides (EX-LAX PO) Take 2 tablets by mouth at bedtime as needed (constipation).    [provider]    Inpatient Medications: Scheduled Meds: . Chlorhexidine Gluconate Cloth  6 each Topical Q0600  . donepezil  10 mg Oral QHS  . insulin aspart  0-15 Units Subcutaneous TID WC  . insulin aspart  0-5 Units Subcutaneous QHS  . labetalol  20 mg Intravenous Once  . pantoprazole (PROTONIX) IV  40 mg Intravenous QHS   Continuous Infusions: . clevidipine     PRN Meds: acetaminophen **OR** acetaminophen (TYLENOL) oral liquid 160 mg/5 mL **OR** acetaminophen, albuterol, docusate sodium, labetalol, polyethylene glycol, senna-docusate, tiotropium  Allergies:    Allergies  Allergen Reactions  . Codeine Palpitations   . Contrast Media [Iodinated Diagnostic Agents] Rash    Social History:   Social History   Socioeconomic History  . Marital status: Married    Spouse name: Not on file  . Number of children: 3  . Years of education: Not on file  . Highest education level: Not on file  Occupational History  . Occupation: Retired    Fish farm manager: DISABLED  Tobacco Use  . Smoking status: Former Smoker    Packs/day: 3.00    Years: 20.00    Pack years: 60.00    Types: Cigarettes    Quit date: 02/04/2002    Years since quitting: 18.3  . Smokeless tobacco: Never Used  Substance and Sexual Activity  . Alcohol use: No  . Drug use: No  . Sexual activity: Not on file  Other Topics Concern  . Not on file  Social History Narrative  . Not on file   Social Determinants of Health   Financial Resource Strain: Not on file  Food Insecurity: Not on file  Transportation Needs: Not on file  Physical Activity: Not on file  Stress: Not on file  Social Connections: Not on file  Intimate Partner Violence: Not on file    Family History:    Family History  Problem Relation Age of Onset  . Hypertension Mother   . Breast cancer Mother   . Stroke Mother   . Heart disease Father   . Heart attack Sister   . Hypertension Brother   . Heart attack Sister   . Colon cancer Brother 84     ROS:  Please see the history of present illness.   All other ROS reviewed and negative.     Physical Exam/Data:   Vitals:   06/22/20 1100 06/22/20 1200 06/22/20 1300 06/22/20 1400  BP:  (!) 143/114 (!) 165/86 (!) 178/136  Pulse: (!) 101 100 (!) 102 (!) 107  Resp: (!) 24 15 16  (!) 25  Temp:    98 F (36.7 C)  TempSrc:    Oral  SpO2: 96% 96% 97% 98%  Weight:        Intake/Output Summary (Last 24 hours) at 06/22/2020 1458 Last data filed at 06/22/2020 1328 Gross per 24 hour  Intake 480 ml  Output 525 ml  Net -45 ml   Last 3 Weights 06/22/2020 06/21/2020 06/20/2020  Weight (  lbs) 196 lb 13.9 oz 197 lb 8.5 oz 191 lb   Weight (kg) 89.3 kg 89.6 kg 86.637 kg     Body mass index is 34.87 kg/m.  General:  Well nourished, well developed, in no acute distress HEENT: normal Lymph: no adenopathy Neck: no JVD Endocrine:  No thryomegaly Vascular: No carotid bruits; FA pulses 2+ bilaterally without bruits  Cardiac:  normal S1, S2; RRR; no murmur  Lungs:  clear to auscultation bilaterally, no wheezing, rhonchi or rales  Abd: soft, nontender, no hepatomegaly  Ext: no edema Musculoskeletal:  No deformities, BUE and BLE strength normal and equal Skin: warm and dry  Neuro: A&Ox1 Psych:  Normal affect   EKG:  The EKG was personally reviewed and demonstrates:  Sensed V paced rhythm, 90bpm Telemetry:  Telemetry was personally reviewed and demonstrates:  A sensed V-paced rhythm, HR 80s, PVC  Relevant CV Studies:  Echo ordered  Echo 12/16/19 1. Left ventricular ejection fraction, by estimation, is 35 to 40%. The  left ventricle has moderately decreased function. The left ventricle  demonstrates global hypokinesis. Indeterminate diastolic filling due to  E-A fusion.  2. Right ventricular systolic function is normal. The right ventricular  size is normal. Tricuspid regurgitation signal is inadequate for assessing  PA pressure.  3. The mitral valve is normal in structure. No evidence of mitral valve  regurgitation. No evidence of mitral stenosis.   Laboratory Data:  High Sensitivity Troponin:  No results for input(s): TROPONINIHS in the last 720 hours.   Chemistry Recent Labs  Lab 06/20/20 2218 06/21/20 0408  NA 141 140  K 3.8 4.0  CL 98 99  CO2 33* 31  GLUCOSE 131* 196*  BUN 20 19  CREATININE 0.89 0.85  CALCIUM 9.1 8.8*  GFRNONAA >60 >60  ANIONGAP 10 10    Recent Labs  Lab 06/20/20 2218  PROT 8.1  ALBUMIN 3.1*  AST 15  ALT 10  ALKPHOS 92  BILITOT 0.4   Hematology Recent Labs  Lab 06/20/20 2218 06/21/20 0408  WBC 9.5 16.6*  RBC 4.13 4.28  HGB 10.7* 10.9*  HCT 36.1 37.2  MCV  87.4 86.9  MCH 25.9* 25.5*  MCHC 29.6* 29.3*  RDW 17.2* 17.0*  PLT 331 337   BNPNo results for input(s): BNP, PROBNP in the last 168 hours.  DDimer No results for input(s): DDIMER in the last 168 hours.   Radiology/Studies:  CT HEAD WO CONTRAST  Result Date: 06/21/2020 CLINICAL DATA:  Stroke follow-up EXAM: CT HEAD WITHOUT CONTRAST TECHNIQUE: Contiguous axial images were obtained from the base of the skull through the vertex without intravenous contrast. COMPARISON:  None. FINDINGS: Brain: There is no mass, hemorrhage or extra-axial collection. The size and configuration of the ventricles and extra-axial CSF spaces are normal. There is hypoattenuation of the white matter, most commonly indicating chronic small vessel disease. Vascular: No abnormal hyperdensity of the major intracranial arteries or dural venous sinuses. No intracranial atherosclerosis. Skull: The visualized skull base, calvarium and extracranial soft tissues are normal. Sinuses/Orbits: No fluid levels or advanced mucosal thickening of the visualized paranasal sinuses. No mastoid or middle ear effusion. The orbits are normal. IMPRESSION: Chronic small vessel disease without acute intracranial abnormality. Electronically Signed   By: Ulyses Jarred M.D.   On: 06/21/2020 22:38   CT HEAD CODE STROKE WO CONTRAST  Result Date: 06/20/2020 CLINICAL DATA:  Code stroke.  Slurred speech and left facial droop EXAM: CT HEAD WITHOUT CONTRAST TECHNIQUE: Contiguous axial images were obtained from  the base of the skull through the vertex without intravenous contrast. COMPARISON:  None. FINDINGS: Brain: There is no mass, hemorrhage or extra-axial collection. The size and configuration of the ventricles and extra-axial CSF spaces are normal. There is hypoattenuation of the white matter, most commonly indicating chronic small vessel disease. Vascular: No abnormal hyperdensity of the major intracranial arteries or dural venous sinuses. No intracranial  atherosclerosis. Skull: The visualized skull base, calvarium and extracranial soft tissues are normal. Sinuses/Orbits: No fluid levels or advanced mucosal thickening of the visualized paranasal sinuses. No mastoid or middle ear effusion. The orbits are normal. ASPECTS ([place] Stroke Program Early CT Score) - Ganglionic level infarction (caudate, lentiform nuclei, internal capsule, insula, M1-M3 cortex): 7 - Supraganglionic infarction (M4-M6 cortex): 3 Total score (0-10 with 10 being normal): 10 IMPRESSION: Chronic small vessel disease without acute intracranial abnormality. ASPECTS is 10. These results were called by telephone at the time of interpretation on 06/20/2020 at 10:32 pm to provider PHILLIP STAFFORD , who verbally acknowledged these results. Electronically Signed   By: Ulyses Jarred M.D.   On: 06/20/2020 22:32   CT ANGIO HEAD NECK W WO CM W PERF (CODE STROKE)  Result Date: 06/21/2020 CLINICAL DATA:  Slurred speech with left facial droop EXAM: CT ANGIOGRAPHY HEAD AND NECK CT PERFUSION BRAIN TECHNIQUE: Multidetector CT imaging of the head and neck was performed using the standard protocol during bolus administration of intravenous contrast. Multiplanar CT image reconstructions and MIPs were obtained to evaluate the vascular anatomy. Carotid stenosis measurements (when applicable) are obtained utilizing NASCET criteria, using the distal internal carotid diameter as the denominator. Multiphase CT imaging of the brain was performed following IV bolus contrast injection. Subsequent parametric perfusion maps were calculated using RAPID software. CONTRAST:  164mL OMNIPAQUE IOHEXOL 350 MG/ML SOLN COMPARISON:  Head CT 06/20/2020 FINDINGS: CTA NECK FINDINGS SKELETON: There is no bony spinal canal stenosis. No lytic or blastic lesion. OTHER NECK: Normal pharynx, larynx and major salivary glands. No cervical lymphadenopathy. Unremarkable thyroid gland. UPPER CHEST: No pneumothorax or pleural effusion. No nodules  or masses. AORTIC ARCH: There is calcific atherosclerosis of the aortic arch. There is no aneurysm, dissection or hemodynamically significant stenosis of the visualized portion of the aorta. Conventional 3 vessel aortic branching pattern. The visualized proximal subclavian arteries are widely patent. RIGHT CAROTID SYSTEM: Normal without aneurysm, dissection or stenosis. LEFT CAROTID SYSTEM: Normal without aneurysm, dissection or stenosis. VERTEBRAL ARTERIES: Left dominant configuration. Both origins are clearly patent. There is no dissection, occlusion or flow-limiting stenosis to the skull base (V1-V3 segments). CTA HEAD FINDINGS POSTERIOR CIRCULATION: --Vertebral arteries: Normal V4 segments. --Inferior cerebellar arteries: Normal. --Basilar artery: Normal. --Superior cerebellar arteries: Normal. --Posterior cerebral arteries (PCA): Normal. ANTERIOR CIRCULATION: --Intracranial internal carotid arteries: Normal. --Anterior cerebral arteries (ACA): Normal. Both A1 segments are present. Patent anterior communicating artery (a-comm). --Middle cerebral arteries (MCA): Normal. VENOUS SINUSES: As permitted by contrast timing, patent. ANATOMIC VARIANTS: None Review of the MIP images confirms the above findings. CT Brain Perfusion Findings: ASPECTS: 10 CBF (<30%) Volume: 17mL Perfusion (Tmax>6.0s) volume: 63mL Mismatch Volume: 52mL Infarction Location:None IMPRESSION: 1. No emergent large vessel occlusion or high-grade stenosis of the intracranial arteries. 2. Normal CT perfusion. Aortic Atherosclerosis (ICD10-I70.0). Electronically Signed   By: Ulyses Jarred M.D.   On: 06/21/2020 00:07     Assessment and Plan:   Acute CVA H/o Dementia - presented with slurred speech and facial droop left side. Head CT showed no acute stroke. No MRI with ICD. She was  given tPA and symptoms improved - Neurology following - Echo ordered - continue tele  - contacted [employer] rep for interrogation.  - might need TEE, although has  underlying dementia so will discuss with MD - PT/OT/SP - per neurology  Possible h/o afib H/o atrial tachycardia - In chart review there is no documented history of afib - In review of ICD transmission she has h/o brief paroxysmal atrial tachycardia, total burden low at <1%.  - device Check 04/08/19 showed 1 brief?afib/flutter event of 4 seconds.  - will interrogate device for possible afib  NICM EF 85-92% Chronic systolic CHF - cath in 7639 showed no obstructive disease - s/p ICD followed by Dr. Caryl Comes, device interrogation as above - most recent echo 12/2019 showed improved LVEF 35-40% - echo ordered - Euvolemic on exam - ALL PTA meds held on admission, lasix 20mg , toprol 25mg  daily, dig 0.125mg  daily. Restart as able - orthostatic hypotension limiting CHF med titration - NO ACE/ARB with history of acute renal failure  Orthostatic hypotension - home meds held>>restart as able  COPD - no acute exacerbation  HLD - LDL 139 - atorvastatin 40mg  daily  For questions or updates, please contact [facility] Please consult [email or web address] for contact info under    Signed, Loula Marcella Ninfa Meeker, PA-C  06/22/2020 2:58 PM

## 2020-06-22 NOTE — Evaluation (Signed)
Occupational Therapy Evaluation Patient Details Name: Stacy Grimes MRN: [id number] DOB: 1950-11-17 Today's Date: 06/22/2020    History of Present Illness 70 y.o. female with a PMHx of atrial fibrillation (not on anticoagulation), nonischemic cardiomyopathy, CHF, AICD, HTN, DM, hypothyroidism, COPD, sleep apnea and mild dementia who was seen overnight by Teleneurology for acute stroke symptoms consisting of slurred speech, difficulty with word finding, left facial droop and left arm weakness. NIHSS was 6. Premorbid mRS was 3.  CT showed no  acute infarction or hemorrhage. She was administered IV tPA for suspected brainstem versus right internal capsule acute ischemic infarction and admitted to the ICU. CTA of head and neck showed no LVO   Clinical Impression   Patient presenting with decreased I in self care, balance, functional mobility/transfer, endurance, and safety awareness.  Patient reports living at home with husband PTA. Pt does not endorse use of AD for mobility but per chart review husband told staff she uses SPC. Pt is confused when asked orientation questions and unable to recall information 5 minutes later this session. She is pleasant and cooperative during session.  Patient currently functioning at min guard- min A for ADLs and functional mobility. She does fatigue quickly.Pt initially needing min cuing to utilize R UE but then began to utilize in dominant fashion with self care tasks.  Able to decrease her to 3 L O2 via [place] this session.  Patient will benefit from acute OT to increase overall independence in the areas of ADLs, functional mobility, and safety awareness in order to safely discharge home with family.    Follow Up Recommendations  Outpatient OT;Supervision - Intermittent    Equipment Recommendations  None recommended by OT       Precautions / Restrictions Precautions Precautions: Fall      Mobility Bed Mobility Overal bed mobility: Needs Assistance Bed  Mobility: Supine to Sit;Sit to Supine     Supine to sit: Min assist Sit to supine: Min assist   General bed mobility comments: min cuing for hand placement and technique    Transfers Overall transfer level: Needs assistance Equipment used: 1 person hand held assist Transfers: Sit to/from [facility] Sit to Stand: Min guard Stand pivot transfers: Min guard       General transfer comment: min cuing for techique and  min guard for balance    Balance Overall balance assessment: Needs assistance Sitting-balance support: Feet supported Sitting balance-Leahy Scale: Good     Standing balance support: During functional activity Standing balance-Leahy Scale: Fair                             ADL either performed or assessed with clinical judgement   ADL Overall ADL's : Needs assistance/impaired     Grooming: Wash/dry hands;Wash/dry face;Sitting;Set up               Lower Body Dressing: Minimal assistance;Sitting/lateral leans                       Vision Patient Visual Report: No change from baseline              Pertinent Vitals/Pain Pain Assessment: Faces Faces Pain Scale: Hurts even more Pain Location: R neck Pain Descriptors / Indicators: Discomfort Pain Intervention(s): Limited activity within patient's tolerance;Repositioned;Monitored during session     Hand Dominance Right   Extremity/Trunk Assessment Upper Extremity Assessment Upper Extremity Assessment: Overall WFL for tasks assessed (  cuing to utilize R UE initially)   Lower Extremity Assessment Lower Extremity Assessment: Overall WFL for tasks assessed   Cervical / Trunk Assessment Cervical / Trunk Assessment: Normal   Communication Communication Communication: No difficulties   Cognition Arousal/Alertness: Awake/alert Behavior During Therapy: WFL for tasks assessed/performed Overall Cognitive Status: No family/caregiver present to determine baseline  cognitive functioning                                 General Comments: Pt oriented to self and reports location as hospital. Unsure of date and details of location. Giving several incorrect answers. OT oriented pt and when asked same questions 5 minutes later she was unable to correctly recall.              Home Living Family/patient expects to be discharged to:: Private residence Living Arrangements: Spouse/significant other Available Help at Discharge: Family;Available 24 hours/day Type of Home: Mobile home Home Access: Stairs to enter Entrance Stairs-Number of Steps: 3-4 Entrance Stairs-Rails: Can reach both;Left;Right Home Layout: One level     Bathroom Shower/Tub: Teacher, early years/pre: Handicapped height Bathroom Accessibility: Yes   Home Equipment: Environmental consultant - 4 wheels;Walker - 2 wheels;Cane - single point;Shower seat;Bedside commode          Prior Functioning/Environment Level of Independence: Independent;Needs assistance  Gait / Transfers Assistance Needed: Pt reports I with ambulation at baseline. Per chart review, pt's husband reported pt using cane at home. ADL's / Homemaking Assistance Needed: Pt reports indep with basic ADL, spouse assists with med mgt, pt endorses driving            OT Problem List: Decreased strength;Impaired balance (sitting and/or standing);Decreased activity tolerance;Decreased knowledge of use of DME or AE;Decreased safety awareness;Cardiopulmonary status limiting activity;Impaired UE functional use      OT Treatment/Interventions: Self-care/ADL training;Manual therapy;Therapeutic exercise;Patient/family education;Modalities;Neuromuscular education;Balance training;Energy conservation;Therapeutic activities;Cognitive remediation/compensation;DME and/or AE instruction    OT Goals(Current goals can be found in the care plan section) Acute Rehab OT Goals Patient Stated Goal: to go home OT Goal Formulation: With  patient Time For Goal Achievement: 07/06/20 Potential to Achieve Goals: Good ADL Goals Pt Will Perform Grooming: with modified independence;standing Pt Will Perform Lower Body Dressing: with modified independence;sit to/from stand Pt Will Transfer to Toilet: with modified independence;ambulating Pt Will Perform Toileting - Clothing Manipulation and hygiene: with modified independence;sit to/from stand  OT Frequency: Min 2X/week   Barriers to D/C:    none known at this time          AM-PAC OT "6 Clicks" Daily Activity     Outcome Measure Help from another person eating meals?: None Help from another person taking care of personal grooming?: A Little Help from another person toileting, which includes using toliet, bedpan, or urinal?: A Little Help from another person bathing (including washing, rinsing, drying)?: A Little Help from another person to put on and taking off regular upper body clothing?: None Help from another person to put on and taking off regular lower body clothing?: A Little 6 Click Score: 20   End of Session Equipment Utilized During Treatment: Oxygen (3Ls) Nurse Communication: Mobility status  Activity Tolerance: Patient tolerated treatment well Patient left: in bed;with bed alarm set;with call bell/phone within reach  OT Visit Diagnosis: Unsteadiness on feet (R26.81);Muscle weakness (generalized) (M62.81)                Time: 1000-1028 OT  Time Calculation (min): 28 min Charges:  OT General Charges $OT Visit: 1 Visit OT Evaluation $OT Eval Moderate Complexity: 1 Mod OT Treatments $Self Care/Home Management : 8-22 mins  Darleen Crocker, MS, OTR/L , CBIS ascom [phone]  06/22/20, 1:22 PM

## 2020-06-22 NOTE — Evaluation (Signed)
Physical Therapy Evaluation Patient Details Name: Stacy Grimes MRN: [id number] DOB: January 17, 1951 Today's Date: 06/22/2020   History of Present Illness  Pt is a 70 y.o. female with a PMHx of atrial fibrillation, nonischemic cardiomyopathy, CHF, AICD, HTN, DM, hypothyroidism, COPD, sleep apnea and mild dementia who was seen overnight by Teleneurology for acute stroke symptoms consisting of slurred speech, difficulty with word finding, left facial droop and left arm weakness. CT showed no acute infarction or hemorrhage. She was administered IV tPA for suspected brainstem versus right internal capsule acute ischemic infarction and admitted to the ICU.  Repeat CT of head performed and MD approval received for patient to participate with PT services.    Clinical Impression  Pt was pleasant and motivated to participate during the session and overall performed well.  Pt required extra time and effort with bed mobility tasks and was steady with transfers and gait with both a RW as well as with HHA.  Pt subjectively reported feeling weaker than at baseline in general but demonstrated good eccentric and concentric control during transfers and no instability or buckling during ambulation.  Will make pt frequency 2x/wk at this time and recommend OPPT to safely address deficits listed in patient problem list for decreased caregiver assistance and eventual return to PLOF.      Follow Up Recommendations Outpatient PT;Supervision for mobility/OOB    Equipment Recommendations  None recommended by PT    Recommendations for Other Services       Precautions / Restrictions Precautions Precautions: Fall Restrictions Weight Bearing Restrictions: No      Mobility  Bed Mobility Overal bed mobility: Modified Independent Bed Mobility: Supine to Sit;Sit to Supine     Supine to sit: Min assist Sit to supine: Min assist   General bed mobility comments: Min extra time and effort only    Transfers Overall  transfer level: Needs assistance Equipment used: 1 person hand held assist;Rolling walker (2 wheeled) Transfers: Sit to/from Stand Sit to Stand: Min guard;Supervision Stand pivot transfers: Min guard       General transfer comment: Good eccentric and concentric control and stability  Ambulation/Gait Ambulation/Gait assistance: Min guard Gait Distance (Feet): 20 Feet Assistive device: Rolling walker (2 wheeled);1 person hand held assist Gait Pattern/deviations: Step-through pattern;Decreased step length - right;Decreased step length - left Gait velocity: decreased   General Gait Details: Pt able to amb 1 x 20' with a RW and 1 x 20' with HHA with slow cadence but good stability throughout  Stairs            Wheelchair Mobility    Modified Rankin (Stroke Patients Only)       Balance Overall balance assessment: Needs assistance Sitting-balance support: Feet supported Sitting balance-Leahy Scale: Good     Standing balance support: Single extremity supported;During functional activity Standing balance-Leahy Scale: Good Standing balance comment: Min lean on the RW and min support with +1 HHA                             Pertinent Vitals/Pain Pain Assessment: 0-10 Pain Score: 4  Faces Pain Scale: Hurts even more Pain Location: R neck Pain Descriptors / Indicators: Discomfort Pain Intervention(s): Monitored during session;Repositioned    Home Living Family/patient expects to be discharged to:: Private residence Living Arrangements: Spouse/significant other Available Help at Discharge: Family;Available 24 hours/day Type of Home: Mobile home Home Access: Stairs to enter Entrance Stairs-Rails: Can reach both;Left;Right Entrance Stairs-Number of  Steps: 3-4 Home Layout: One level Home Equipment: [place] - 4 wheels;Walker - 2 wheels;Cane - single point;Shower seat;Bedside commode      Prior Function Level of Independence: Independent;Needs assistance    Gait / Transfers Assistance Needed: Pt reported Ind amb without an AD with only occasional SPC use PRN, Ind with ADLs, community ambulator, no fall history  ADL's / Homemaking Assistance Needed: Pt reports indep with basic ADL, spouse assists with med mgt, pt endorses driving        Hand Dominance   Dominant Hand: Right    Extremity/Trunk Assessment   Upper Extremity Assessment Upper Extremity Assessment: Overall WFL for tasks assessed    Lower Extremity Assessment Lower Extremity Assessment: Overall WFL for tasks assessed    Cervical / Trunk Assessment Cervical / Trunk Assessment: Normal  Communication   Communication: No difficulties  Cognition Arousal/Alertness: Awake/alert Behavior During Therapy: WFL for tasks assessed/performed Overall Cognitive Status: Within Functional Limits for tasks assessed                                 General Comments: Pt oriented to self and reports location as hospital. Unsure of date and details of location. Giving several incorrect answers. OT oriented pt and when asked same questions 5 minutes later she was unable to correctly recall.      General Comments      Exercises Total Joint Exercises Ankle Circles/Pumps: AROM;Strengthening;Both;10 reps Quad Sets: 10 reps;Both;Strengthening Gluteal Sets: Strengthening;Both;10 reps Heel Slides: Strengthening;Both;10 reps Hip ABduction/ADduction: Strengthening;Both;10 reps Long Arc Quad: Strengthening;Both;10 reps Knee Flexion: 10 reps;Both;Strengthening Other Exercises Other Exercises: HEP education for BLE APs, QS, GS, and LAQs x 10 each 5-6x/day   Assessment/Plan    PT Assessment Patient needs continued PT services  PT Problem List Decreased activity tolerance;Decreased balance;Decreased mobility       PT Treatment Interventions DME instruction;Gait training;Stair training;Functional mobility training;Therapeutic activities;Therapeutic exercise;Balance  training;Patient/family education    PT Goals (Current goals can be found in the Care Plan section)  Acute Rehab PT Goals Patient Stated Goal: To return home PT Goal Formulation: With patient Time For Goal Achievement: 07/05/20 Potential to Achieve Goals: Good    Frequency Min 2X/week   Barriers to discharge        Co-evaluation               AM-PAC PT "6 Clicks" Mobility  Outcome Measure Help needed turning from your back to your side while in a flat bed without using bedrails?: A Little Help needed moving from lying on your back to sitting on the side of a flat bed without using bedrails?: A Little Help needed moving to and from a bed to a chair (including a wheelchair)?: A Little Help needed standing up from a chair using your arms (e.g., wheelchair or bedside chair)?: A Little Help needed to walk in hospital room?: A Little Help needed climbing 3-5 steps with a railing? : A Little 6 Click Score: 18    End of Session Equipment Utilized During Treatment: Gait belt Activity Tolerance: Patient tolerated treatment well Patient left: in bed;with call bell/phone within reach;with bed alarm set Nurse Communication: Mobility status PT Visit Diagnosis: Difficulty in walking, not elsewhere classified (R26.2)    Time: 1660-6301 PT Time Calculation (min) (ACUTE ONLY): 23 min   Charges:   PT Evaluation $PT Eval Moderate Complexity: 1 Mod PT Treatments $Therapeutic Exercise: 8-22 mins  Linus Salmons PT, DPT 06/22/20, 2:03 PM

## 2020-06-22 NOTE — Telephone Encounter (Signed)
I hope she feels better.  It seems that they already consulted our team and Dr. Rockey Situ will see her today.

## 2020-06-22 NOTE — Progress Notes (Signed)
*  PRELIMINARY RESULTS* Echocardiogram 2D Echocardiogram has been performed.  Sherrie Sport 06/22/2020, 9:24 AM

## 2020-06-23 ENCOUNTER — Encounter
Admission: EM | Disposition: A | Discharge status: Home / Self Care | Payer: Self-pay | Source: Home / Self Care | Attending: Internal Medicine

## 2020-06-23 ENCOUNTER — Inpatient Hospital Stay (HOSPITAL_COMMUNITY)
Admit: 2020-06-23 | Discharge: 2020-06-23 | Disposition: A | Discharge status: Home / Self Care | Payer: Medicare Other | Attending: Cardiovascular Disease | Admitting: Cardiovascular Disease

## 2020-06-23 DIAGNOSIS — I340 Nonrheumatic mitral (valve) insufficiency: Secondary | ICD-10-CM

## 2020-06-23 DIAGNOSIS — I6389 Other cerebral infarction: Secondary | ICD-10-CM

## 2020-06-23 DIAGNOSIS — I361 Nonrheumatic tricuspid (valve) insufficiency: Secondary | ICD-10-CM

## 2020-06-23 HISTORY — PX: TEE WITHOUT CARDIOVERSION: SHX5443

## 2020-06-23 LAB — GLUCOSE, CAPILLARY
Glucose-Capillary: 128 mg/dL — ABNORMAL HIGH (ref 70–99)
Glucose-Capillary: 104 mg/dL — ABNORMAL HIGH (ref 70–99)

## 2020-06-23 SURGERY — ECHOCARDIOGRAM, TRANSESOPHAGEAL
Anesthesia: Moderate Sedation

## 2020-06-23 MED ORDER — ATORVASTATIN CALCIUM 20 MG PO TABS: 40.0000 mg | ORAL_TABLET | Freq: Every day | ORAL | 1 refills | Status: DC

## 2020-06-23 MED ORDER — ASPIRIN 325 MG PO TABS: 325.0000 mg | ORAL_TABLET | Freq: Every day | ORAL | Status: DC

## 2020-06-23 MED ORDER — MIDAZOLAM HCL 5 MG/5ML IJ SOLN
INTRAMUSCULAR | Status: AC | PRN
  Administered 2020-06-23: 2 mg via INTRAVENOUS
  Administered 2020-06-23: 1 mg via INTRAVENOUS

## 2020-06-23 MED ORDER — FENTANYL CITRATE (PF) 100 MCG/2ML IJ SOLN
INTRAMUSCULAR | Status: AC | PRN
  Administered 2020-06-23: 25 ug via INTRAVENOUS
  Administered 2020-06-23: 50 ug via INTRAVENOUS

## 2020-06-23 MED ORDER — MIDAZOLAM HCL 5 MG/5ML IJ SOLN
INTRAMUSCULAR | Status: AC
  Filled 2020-06-23: qty 5

## 2020-06-23 MED ORDER — SODIUM CHLORIDE FLUSH 0.9 % IV SOLN
INTRAVENOUS | Status: AC
  Filled 2020-06-23: qty 10

## 2020-06-23 MED ORDER — FENTANYL CITRATE (PF) 100 MCG/2ML IJ SOLN
INTRAMUSCULAR | Status: AC
  Filled 2020-06-23: qty 2

## 2020-06-23 MED ORDER — BUTAMBEN-TETRACAINE-BENZOCAINE 2-2-14 % EX AERO
INHALATION_SPRAY | CUTANEOUS | Status: AC
  Filled 2020-06-23: qty 5

## 2020-06-23 MED ORDER — CLOPIDOGREL BISULFATE 75 MG PO TABS: 75.0000 mg | ORAL_TABLET | Freq: Every day | ORAL | 1 refills | Status: DC

## 2020-06-23 MED ORDER — LIDOCAINE VISCOUS HCL 2 % MT SOLN
OROMUCOSAL | Status: AC
  Filled 2020-06-23: qty 15

## 2020-06-23 MED ORDER — ASPIRIN EC 81 MG PO TBEC
81.0000 mg | DELAYED_RELEASE_TABLET | Freq: Every day | ORAL | Status: DC
  Administered 2020-06-23: 81 mg via ORAL
  Filled 2020-06-23: qty 1

## 2020-06-23 MED ORDER — CLOPIDOGREL BISULFATE 75 MG PO TABS
75.0000 mg | ORAL_TABLET | Freq: Every day | ORAL | Status: DC
  Administered 2020-06-23: 75 mg via ORAL
  Filled 2020-06-23: qty 1

## 2020-06-23 NOTE — Progress Notes (Signed)
Progress Note  Patient Name: Stacy Grimes Date of Encounter: 06/23/2020  [facility] Cardiologist: Virl Axe, MD   Subjective   Tolerated TEE this morning Dilated cardiomyopathy ejection fraction 20% global hypokinesis No mural thrombus or apical thrombus noted, no thrombus in the left atrium or appendage Mild to moderate diffuse aortic atherosclerosis noted particularly in the arch No PFO or ASD though unable to test with Valsalva Unable to visualize ICD wire well  Tolerated procedure well, no other complaints, feels back to her baseline  Inpatient Medications    Scheduled Meds: . allopurinol  300 mg Oral BID  . aspirin EC  81 mg Oral Daily  . atorvastatin  40 mg Oral Daily  . butamben-tetracaine-benzocaine      . Chlorhexidine Gluconate Cloth  6 each Topical Q0600  . clopidogrel  75 mg Oral Daily  . digoxin  0.125 mg Oral Q M,W,F  . donepezil  10 mg Oral QHS  . fentaNYL      . insulin aspart  0-15 Units Subcutaneous TID WC  . insulin aspart  0-5 Units Subcutaneous QHS  . levothyroxine  112 mcg Oral QAC breakfast  . lidocaine      . melatonin  5 mg Oral QHS  . metoprolol succinate  25 mg Oral Daily  . midazolam      . mometasone-formoterol  2 puff Inhalation BID  . pantoprazole (PROTONIX) IV  40 mg Intravenous QHS  . sodium chloride flush      . venlafaxine  75 mg Oral BID  . vitamin B-12  1,000 mcg Oral BID   Continuous Infusions:  PRN Meds: acetaminophen **OR** acetaminophen (TYLENOL) oral liquid 160 mg/5 mL **OR** acetaminophen, albuterol, docusate sodium, furosemide, gabapentin, labetalol, polyethylene glycol, senna-docusate, tiotropium   Vital Signs    Vitals:   06/23/20 0951 06/23/20 1000 06/23/20 1015 06/23/20 1200  BP: 133/73 130/90 134/63 136/63  Pulse: 77 79 68 83  Resp: 15 17 17  (!) 21  Temp:    97.7 F (36.5 C)  TempSrc:    Oral  SpO2: 92% 94% 96% 95%  Weight:        Intake/Output Summary (Last 24 hours) at 06/23/2020 1402 Last  data filed at 06/23/2020 0500 Gross per 24 hour  Intake 79.72 ml  Output 150 ml  Net -70.28 ml   Last 3 Weights 06/23/2020 06/22/2020 06/21/2020  Weight (lbs) 198 lb 13.7 oz 196 lb 13.9 oz 197 lb 8.5 oz  Weight (kg) 90.2 kg 89.3 kg 89.6 kg      Telemetry    Paced rhythm- Personally Reviewed  ECG     - Personally Reviewed  Physical Exam   GEN: No acute distress.   Neck: No JVD Cardiac: RRR, no murmurs, rubs, or gallops.  Respiratory: Clear to auscultation bilaterally. GI: Soft, nontender, non-distended  MS: No edema; No deformity. Neuro:  Nonfocal  Psych: Normal affect   Labs    High Sensitivity Troponin:  No results for input(s): TROPONINIHS in the last 720 hours.    Chemistry Recent Labs  Lab 06/20/20 2218 06/21/20 0408  NA 141 140  K 3.8 4.0  CL 98 99  CO2 33* 31  GLUCOSE 131* 196*  BUN 20 19  CREATININE 0.89 0.85  CALCIUM 9.1 8.8*  PROT 8.1  --   ALBUMIN 3.1*  --   AST 15  --   ALT 10  --   ALKPHOS 92  --   BILITOT 0.4  --   GFRNONAA >  60 >60  ANIONGAP 10 10     Hematology Recent Labs  Lab 06/20/20 2218 06/21/20 0408  WBC 9.5 16.6*  RBC 4.13 4.28  HGB 10.7* 10.9*  HCT 36.1 37.2  MCV 87.4 86.9  MCH 25.9* 25.5*  MCHC 29.6* 29.3*  RDW 17.2* 17.0*  PLT 331 337    BNPNo results for input(s): BNP, PROBNP in the last 168 hours.   DDimer No results for input(s): DDIMER in the last 168 hours.   Radiology    CT HEAD WO CONTRAST  Result Date: 06/21/2020 CLINICAL DATA:  Stroke follow-up EXAM: CT HEAD WITHOUT CONTRAST TECHNIQUE: Contiguous axial images were obtained from the base of the skull through the vertex without intravenous contrast. COMPARISON:  None. FINDINGS: Brain: There is no mass, hemorrhage or extra-axial collection. The size and configuration of the ventricles and extra-axial CSF spaces are normal. There is hypoattenuation of the white matter, most commonly indicating chronic small vessel disease. Vascular: No abnormal hyperdensity of  the major intracranial arteries or dural venous sinuses. No intracranial atherosclerosis. Skull: The visualized skull base, calvarium and extracranial soft tissues are normal. Sinuses/Orbits: No fluid levels or advanced mucosal thickening of the visualized paranasal sinuses. No mastoid or middle ear effusion. The orbits are normal. IMPRESSION: Chronic small vessel disease without acute intracranial abnormality. Electronically Signed   By: Ulyses Jarred M.D.   On: 06/21/2020 22:38   ECHOCARDIOGRAM COMPLETE  Result Date: 06/22/2020    ECHOCARDIOGRAM REPORT   Patient Name:   Stacy Grimes [facility] Date of Exam: 06/22/2020 Medical Rec #:  [id number]        Height:       63.0 in Accession #:    [id number]       Weight:       196.9 lb Date of Birth:  Feb 23, 1950         BSA:          1.921 m Patient Age:    70 years         BP:           144/74 mmHg Patient Gender: F                HR:           85 bpm. Exam Location:  [facility] Procedure: 2D Echo, Cardiac Doppler and Color Doppler Indications:     Stroke I63.9  History:         Patient has prior history of Echocardiogram examinations, most                  recent 12/16/2019. COPD, Signs/Symptoms:Shortness of Breath;                  Risk Factors:Hypertension.  Sonographer:     Sherrie Sport RDCS (AE) Referring Phys:  Egan OUMA Diagnosing Phys: Ida Rogue MD  Sonographer Comments: No apical window, no subcostal window and Technically challenging study due to limited acoustic windows. Image acquisition challenging due to COPD. IMPRESSIONS  1. Challenging images  2. Left ventricular ejection fraction, by estimation, is <20%. The left ventricle has severely decreased function. The left ventricle has no regional wall motion abnormalities. The left ventricular internal cavity size was moderately dilated. Left ventricular diastolic parameters are indeterminate.  3. Right ventricular systolic function is normal. The right ventricular size is normal. FINDINGS  Left  Ventricle: Left ventricular ejection fraction, by estimation, is <20%. The left ventricle has severely decreased function. The left ventricle has  no regional wall motion abnormalities. The left ventricular internal cavity size was moderately dilated. There is no left ventricular hypertrophy. Left ventricular diastolic parameters are indeterminate. Right Ventricle: The right ventricular size is normal. No increase in right ventricular wall thickness. Right ventricular systolic function is normal. Left Atrium: Left atrial size was normal in size. Right Atrium: Right atrial size was normal in size. Pericardium: There is no evidence of pericardial effusion. Mitral Valve: The mitral valve was not well visualized. Trivial mitral valve regurgitation. No evidence of mitral valve stenosis. Tricuspid Valve: The tricuspid valve is not well visualized. Tricuspid valve regurgitation is not demonstrated. No evidence of tricuspid stenosis. Aortic Valve: The aortic valve was not well visualized. Aortic valve regurgitation is not visualized. No aortic stenosis is present. Pulmonic Valve: The pulmonic valve was not well visualized. Pulmonic valve regurgitation is not visualized. No evidence of pulmonic stenosis. Aorta: The aortic root is normal in size and structure. Venous: The pulmonary veins were not well visualized. The inferior vena cava is normal in size with greater than 50% respiratory variability, suggesting right atrial pressure of 3 mmHg. IAS/Shunts: No atrial level shunt detected by color flow Doppler. Additional Comments: A device lead is visualized.  LEFT VENTRICLE PLAX 2D LVIDd:         6.23 cm LVIDs:         5.54 cm LV PW:         1.24 cm LV IVS:        1.06 cm LVOT diam:     2.00 cm LVOT Area:     3.14 cm  LEFT ATRIUM         Index LA diam:    3.70 cm 1.93 cm/m                        PULMONIC VALVE AORTA                 PV Vmax:        0.90 m/s Ao Root diam: 2.90 cm PV Peak grad:   3.2 mmHg                        RVOT Peak grad: 2 mmHg   SHUNTS Systemic Diam: 2.00 cm Ida Rogue MD Electronically signed by Ida Rogue MD Signature Date/Time: 06/22/2020/8:00:37 PM    Final     Cardiac Studies     Patient Profile     FENDI MEINHARDT is a 70 y.o. female with a hx of orthostatic hypotension, HFrEF, NICM s/p ICD placement by Dr. Caryl Comes in 2018, dementia, COPD on home O2 3-4L, HTN, abdominal aortic aneurysm, hypothyroidism, mild dementia, remote tobacco use, OSA Presenting with stroke, given tPA  Assessment & Plan    Acute stroke  No acute stroke on CT scan head -Symptoms improved after tPA  -Severe cardiomyopathy with global hypokinesis on surface echo -Transesophageal echo today with no mural thrombus noted though still at high risk given severe cardiomyopathy, no apical thrombus, no left atrial or left atrial appendage thrombus -Concern for significant aortic atherosclerosis especially in the arch For now would recommend aspirin with Plavix -Follow-up with Dr. Caryl Comes, prior ICD downloads indicating atrial tachycardia with no atrial fibrillation.  We will hold off on NOAC at this time   Nonischemic cardiomyopathy, ejection fraction 20%  ICD in place,  Appears euvolemic Followed by Dr. Fletcher Anon and Dr. Caryl Comes  Would continue digoxin, metoprolol, Lasix regiment she  was taking as outpatient   Atrial tachycardia  Noted on prior ICD downloads  No documentation of atrial fibrillation  Atrial tachycardia documented on defibrillator downloads    Results discussed with patient in detail, plan discussed with hospitalist service, medications reviewed  Total encounter time more than 35 minutes  Greater than 50% was spent in counseling and coordination of care with the patient   For questions or updates, please contact [place] Please consult [email or web address] for contact info under        Signed, Ida Rogue, MD  06/23/2020, 2:02 PM

## 2020-06-23 NOTE — Progress Notes (Signed)
*  PRELIMINARY RESULTS* Echocardiogram 2D Echocardiogram has been performed.  Stacy Grimes 06/23/2020, 9:59 AM

## 2020-06-23 NOTE — TOC Progression Note (Deleted)
Transition of Care [facility]) - Progression Note    Patient Details  Name: Stacy Grimes MRN: [id number] Date of Birth: 1950-06-14  Transition of Care [facility]) CM/SW [place], [place] Phone Number: 06/23/2020, 1:52 PM  Clinical Narrative:    REHABILITATION SERVICES REFERRAL        Occupational Therapy * Physical Therapy * Speech Therapy                           DATE  Jun 23, 2020      PATIENT NAME: [name]     MRN: [id number]      DIAGNOSIS/DIAGNOSIS CODE: J44.9   DATE OF DISCHARGE: Jun 23, 2020         PRIMARY CARE PHYSICIAN : Fae Pippin                  PCP PHONE/FAX: [phone]      Dear Provider: [facility] OUTPATIENT PT      I certify that I have examined this patient and that occupational/physical/speech therapy is necessary on an outpatient basis.    The patient has expressed interest in completing their recommended course of therapy at your  location.  Once a formal order from the patient's primary care physician has been obtained, please  contact him/her to schedule an appointment for evaluation at your earliest convenience.   [X]   Physical Therapy Evaluate and Treat          [  ]  Occupational Therapy Evaluate and Treat                                 [  ]  Speech Therapy Evaluate and Treat    The patient's primary care physician (listed above) must furnish and be responsible for a formal order such that the recommended services may be furnished while under the primary physician's care, and that the plan of care will be established and reviewed every 30 days (or more often if condition necessitates).    Referral Electronically signed by Physician, see below:    Expected Discharge Plan and Services           Expected Discharge Date: 06/23/20                                     Social Determinants of Health (SDOH) Interventions    Readmission Risk Interventions No flowsheet data found.

## 2020-06-23 NOTE — Progress Notes (Addendum)
Repeat head CT showed no evidence of hemorrhage.   TEE is scheduled for today. Cardiology is seeing the patient to make a determination on anticoagulation versus antiplatelet medication for secondary stroke prevention.   Atrial fibrillation described by husband was not substantiated by chart review or pacer downloads evaluated by Cardiology. Anticoagulation may, however, be indicated due to severely reduced EF (Left ventricular ejection fraction, by estimation, is <20% on TTE performed 5/19).   Will restart on ASA for now, which can be switched to anticoagulation if cleared by Cardiology given severely reduced EF. She was on 81 mg at home, therefore increasing dose to 325 mg.  Electronically signed: Dr. Kerney Elbe

## 2020-06-23 NOTE — TOC Transition Note (Signed)
Transition of Care [facility]) - Progression Note    Patient Details  Name: Stacy Grimes MRN: [id number] Date of Birth: 22-Mar-1950  Transition of Care [facility]) CM/SW [place], [place] Phone Number: [phone] 06/23/2020, 3:07 PM  Clinical Narrative:    Patient will d/c home with referral to [facility] PT for physical therapy.  No other TOC needs at this time.        Expected Discharge Plan and Services           Expected Discharge Date: 06/23/20                                     Social Determinants of Health (SDOH) Interventions    Readmission Risk Interventions No flowsheet data found.

## 2020-06-23 NOTE — Progress Notes (Addendum)
Speech Language Pathology Treatment: Dysphagia  Patient Details Name: Stacy Grimes MRN: [id number] DOB: 08/11/1950 Today's Date: 06/23/2020 Time: 8527-7824 SLP Time Calculation (min) (ACUTE ONLY): 40 min  Assessment / Plan / Recommendation Clinical Impression  Pt seen for ongoing assessment of swallowing and toleration of diet; education re: swallowing and impact from Cognitive decline, Esophageal dysmotility/REFLUX and impact on swallowing. Further education was given on diet consistency rec'd d/t Edentulous status; gneeral aspiration and REFLUX precautions. Husband present.  Pt was feeding self nab crackers after 2 trials of mac and cheese. Noted lengthy oral phase time w/ pt stating she was "still mashing it up" after ~1 min of oral phase time w/ 1 small single bite of food. Suspect there could be a component of oral phase dysphagia d/t the Cognitive decline from Dementia. Pt consumed a few bites and several sips of thin liquids w/ no overt s/s of aspiration noted while in room; no decline in respiratory status, vocal quality clear b/t bites/sips. O2 sats remained 96%.  Husband present -- education given to both on general aspiration and reflux precautions w/ Handouts; dicussed food consistencies/preparation for optimal gumming and mashing d/t Edentulous status -- Discussed the benefit of the MINCED consistency diet for easier Esophageal clearing and for ease of oral phase management in light of pt's Edentulous status baseline; discussed environmental distractions and how to reduce them including turning TV off/down(however they kept the TV turned on w/ volume when this SLP left the room). Recommended f/u w/ GI for monitoring REFLUX as ANY Esophageal Dysmotility can increase risk for Retrograde activity of food/liquid material this increase risk for aspiration of REFLUX material.    Recommend continue a Dysphagia level 3 (mech soft foods w/ meats cut well w/ gravies to moisten) diet w/ Thin  liquids; aspiration precautions; REFLUX precautions; Pill in Puree for easier clearing. NSG to offer tray setup and feeding support as needed. Recommend f/u w/ GI for REFLUX management. ST services will sign off at this time as pt appears at her Baseline w/ oropharyngeal phase swallowing and behaviors as per Husband's report; NSG to reconsult if new needs arise while admitted.   Addendum: pt is verbally communicating her wants/needs appropriately; speech fully intelligible. Pt is Edentulous which can impact intelligibility somewhat. She follows instructions w/ cues. Husband states she appears at her Baseline -- pt has Dementia baseline (worse in recent months per Son/family for which Neurology was "increasing her meds" at home). Pt appears to communicate functionally at this time. If any further needs re: speech-language, recommend f/u at next venue of care. ST services will sign off at this time.      HPI HPI: Pt is a 70yo woman with history of multiple medical issues including Dementia(worse in recent months per Family, on medication w/ Neurology), GERD, OSA, deafness in Left ear, Obesity, CHF s/p pacer, afib not on anticoagulation, HTN, DM, Edentulous presenting as a stroke w/ Left sided weakness and slurred speech.  Pt admitted w/ diagnosis of acute ischemic stroke and she is s/p TPA; she has a history of Afib and was not on anticoagulation.  Head CT: "Chronic small vessel disease without acute intracranial abnormality".      SLP Plan  All goals met       Recommendations  Diet recommendations: Dysphagia 3 (mechanical soft);Thin liquid Liquids provided via: Cup;Straw Medication Administration: Whole meds with puree (for safer swallowing d/t Dementia) Supervision: Patient able to self feed;Intermittent supervision to cue for compensatory strategies Compensations: Minimize environmental distractions;Slow rate;Small  sips/bites;Lingual sweep for clearance of pocketing;Multiple dry swallows after  each bite/sip;Follow solids with liquid Postural Changes and/or Swallow Maneuvers: Seated upright 90 degrees;Upright 30-60 min after meal;Out of bed for meals                General recommendations:  (Dietician f/u) Oral Care Recommendations: Oral care BID;Patient independent with oral care;Staff/trained caregiver to provide oral care Follow up Recommendations: None SLP Visit Diagnosis: Dysphagia, oral phase (R13.11) Plan: All goals met       GO                  Orinda Kenner, MS, CCC-SLP Speech Language Pathologist Rehab Services [phone] [facility] 06/23/2020, 1:25 PM

## 2020-06-23 NOTE — Progress Notes (Signed)
                             DATE  Jun 23, 2020             PATIENT NAME: Stacy Grimes     MRN: [id number]             DIAGNOSIS/DIAGNOSIS CODE: J44.9              DATE OF DISCHARGE: Jun 23, 2020                PRIMARY CARE PHYSICIAN : Fae Pippin                    PCP PHONE/FAX: [phone]                            Dear Provider: [facility] OUTPATIENT PT                            I certify that I have examined this patient and that occupational/physical/speech therapy is necessary on an outpatient basis.    The patient has expressed interest in completing their recommended course of therapy at your  location.  Once a formal order from the patient's primary care physician has been obtained, please  contact him/her to schedule an appointment for evaluation at your earliest convenience.   [X]   Physical Therapy Evaluate and Treat          [  ]  Occupational Therapy Evaluate and Treat                                 [  ]  Speech Therapy Evaluate and Treat    The patient's primary care physician (listed above) must furnish and be responsible for a formal order such that the recommended services may be furnished while under the primary physician's care, and that the plan of care will be established and reviewed every 30 days (or more often if condition necessitates).    Referral Electronically signed by Physician, see below:

## 2020-06-23 NOTE — Discharge Instructions (Signed)
Diet recommended at D/C:  Dysphagia level 3 (mech soft foods w/ meats cut/chopped well w/ gravies to moisten) diet w/ Thin liquids; aspiration precautions; REFLUX precautions; Pills in Puree for easier clearing. Setup and monitoring at meals d/t Cognitive decline/Dementia. Recommend f/u w/ GI for REFLUX management.  OUT patient PT/OT

## 2020-06-23 NOTE — Discharge Summary (Signed)
[facility] [place] NAME: Stacy Grimes    MR#:  [id number]  DATE OF BIRTH:  [birth year]  DATE OF ADMISSION:  06/20/2020 ADMITTING PHYSICIAN: Fritzi Mandes, MD  DATE OF DISCHARGE: 06/23/2020  PRIMARY CARE PHYSICIAN: Cyndi Bender, PA-C    ADMISSION DIAGNOSIS:  Acute ischemic stroke [facility]) [I63.9] Acute CVA (cerebrovascular accident) ([place]) [I63.9] Atrial fibrillation, unspecified type ([place]) [I48.91] Chronic obstructive pulmonary disease, unspecified COPD type ([place]) [J44.9] Chronic congestive heart failure, unspecified heart failure type ([place]) [I50.9] Type 2 diabetes mellitus with hyperglycemia, unspecified whether long term insulin use ([place]) [E11.65]  DISCHARGE DIAGNOSIS:  Acute CVA-- s/p TPA  SECONDARY DIAGNOSIS:   Past Medical History:  Diagnosis Date  . AAA (abdominal aortic aneurysm) ([place])   . Anginal pain ([place])   . Anxiety   . Arthritis   . Automatic implantable cardioverter-defibrillator in situ   . Biventricular ICD  [employer]   . COPD (chronic obstructive pulmonary disease) ([place])   . Deafness    left ear  . Dementia ([place])   . Depression   . Dizziness   . Dysrhythmia   . Fracture    history of spinal fracture  . GERD (gastroesophageal reflux disease)   . HFrEF (heart failure with reduced ejection fraction) ([place])    a. 2010 Cath: nonobs dzs, EF 15-20%; b. 09/2017 Echo: EF suboptimal. EF low nl; c. 04/2018 Echo: EF 40-45%.  . Hypertension   . Hypothyroidism   . Non-ischemic cardiomyopathy ([place])    a. 2010 Cath: nonobs dzs, EF 15-20%; b. 2011 s/p SJM CRT-D; c. 09/2016 s/p Gen change; d. 09/2017 Echo: EF suboptimal. EF low nl; e. 04/2018 Echo: EF 40-45%.  . Oxygen dependent   . Palpitations   . Shortness of breath dyspnea   . Sleep apnea   . Wheezing     HOSPITAL COURSE:   Patient is a 70 year old with history of anxiety depression, COPD, dementia, non-ischemic cardiomyopathy EF of 15 to 20% status post AICD placement, type  II diabetes, hypothyroidism comes to the emergency room for evaluation of acute stroke. Patient was found to have slurred speech and left facial droop with weakness.  Acute CVA -- patient presented with left facial drill, slurred speech and weakness. CT head on admission was negative. Tele neurology consultation was placed. Patient is status post TPA. -- CT had repeated today remains stable. Patient shows improvement in her symptoms. -- Seen by physical therapy occupational therapy--- recommends outpatient PT OT -- there is a question of? Atrial fibrillation which I don't see any documentation in the chart. Possibility of brief episode. ICD interrogation pending-- cardiology consultation with Dr. Rockey Situ to see if patient is a candidate for anticoagulation -- seen by neurology-- recommends anticoagulation if okay with cardiology -- continue statins --5/20--pt underwent TEE. Per Dr Rockey Situ  Start pt on ASA +Plavix. EF ~20% , aortic calcification. could not see obvious LV thrombus.  OK to go home per Neurology. D/w pt and husband at bedisde  Non- ischemic cardiomyopathy EF 35 to 47% chronic systolic CHF -- patient appears euvolemic -- resume home meds Lasix, Toprol-XL, digoxin -- no ACE/ARB with history of renal insufficiency  COPD -- stable  Hyperlipidemia -- started on atorvastatin  History of dementia     Procedures: Family communication : discussed with husband at bedside Consults : neurology, cardiology CODE STATUS: full DVT Prophylaxis : Level of care: Stepdown Status is: Inpatient   Dispo: The patient is from: Home  Anticipated d/c  is to: Home  Patient currently is  medically stable to d/c.              Difficult to place patient No   Await cardiology consultation and final recommendation. If continues to show improvement likely discharge tomorrow to home with outpatient PT OT CONSULTS OBTAINED:  Treatment Team:  Minna Merritts, MD  DRUG ALLERGIES:   Allergies  Allergen Reactions  . Codeine Palpitations  . Contrast Media [Iodinated Diagnostic Agents] Rash    DISCHARGE MEDICATIONS:   Allergies as of 06/23/2020      Reactions   Codeine Palpitations   Contrast Media [iodinated Diagnostic Agents] Rash      Medication List    TAKE these medications   acetaminophen 500 MG tablet Commonly known as: TYLENOL Take 2 tablets (1,000 mg total) by mouth every 8 (eight) hours as needed for mild pain or moderate pain.   albuterol 0.63 MG/3ML nebulizer solution Commonly known as: ACCUNEB Take 1 ampule by nebulization every 6 (six) hours as needed for wheezing.   albuterol 108 (90 Base) MCG/ACT inhaler Commonly known as: VENTOLIN HFA Inhale 2 puffs into the lungs every 6 (six) hours as needed.   allopurinol 300 MG tablet Commonly known as: ZYLOPRIM Take 300 mg by mouth 2 (two) times daily.   aspirin 81 MG EC tablet Take 81 mg by mouth daily.   atorvastatin 20 MG tablet Commonly known as: LIPITOR Take 2 tablets (40 mg total) by mouth daily.   BENGAY EX Apply 1 application topically daily as needed (back pain).   budesonide-formoterol 160-4.5 MCG/ACT inhaler Commonly known as: SYMBICORT Inhale 2 puffs into the lungs 2 (two) times daily.   clopidogrel 75 MG tablet Commonly known as: PLAVIX Take 1 tablet (75 mg total) by mouth daily.   digoxin 0.125 MG tablet Commonly known as: LANOXIN Take 1 tablet (0.125 mg total) by mouth every Monday, Wednesday, and Friday.   donepezil 5 MG tablet Commonly known as: ARICEPT Take 5 mg by mouth at bedtime.   EX-LAX PO Take 2 tablets by mouth at bedtime as needed (constipation).   furosemide 20 MG tablet Commonly known as: Lasix Take 1 tablet (20 mg total) by mouth daily as needed for fluid or edema.   gabapentin 300 MG capsule Commonly known as: NEURONTIN Take 300 mg by mouth 2 (two) times daily as needed (RLS symptoms/ pain).   hydrOXYzine 25 MG  capsule Commonly known as: VISTARIL Take 25 mg by mouth at bedtime as needed for anxiety.   levothyroxine 112 MCG tablet Commonly known as: SYNTHROID Take 112 mcg by mouth daily before breakfast.   metFORMIN 500 MG 24 hr tablet Commonly known as: GLUCOPHAGE-XR Take 500 mg by mouth 2 (two) times daily with a meal.   metoprolol succinate 25 MG 24 hr tablet Commonly known as: TOPROL-XL Take 25 mg by mouth daily.   omeprazole 20 MG capsule Commonly known as: PRILOSEC Take 1 capsule (20 mg total) by mouth every morning.   tiotropium 18 MCG inhalation capsule Commonly known as: SPIRIVA Place 1 capsule (18 mcg total) into inhaler and inhale daily as needed (shortness of breath).   venlafaxine 75 MG tablet Commonly known as: EFFEXOR Take 75 mg by mouth 2 (two) times daily.   vitamin B-12 1000 MCG tablet Commonly known as: CYANOCOBALAMIN Take 1,000 mcg by mouth 2 (two) times daily.       If you experience worsening of your admission symptoms, develop shortness of breath, life threatening  emergency, suicidal or homicidal thoughts you must seek medical attention immediately by calling 911 or calling your MD immediately  if symptoms less severe.  You Must read complete instructions/literature along with all the possible adverse reactions/side effects for all the Medicines you take and that have been prescribed to you. Take any new Medicines after you have completely understood and accept all the possible adverse reactions/side effects.   Please note  You were cared for by a hospitalist during your hospital stay. If you have any questions about your discharge medications or the care you received while you were in the hospital after you are discharged, you can call the unit and asked to speak with the hospitalist on call if the hospitalist that took care of you is not available. Once you are discharged, your primary care physician will handle any further medical issues. Please note that NO  REFILLS for any discharge medications will be authorized once you are discharged, as it is imperative that you return to your primary care physician (or establish a relationship with a primary care physician if you do not have one) for your aftercare needs so that they can reassess your need for medications and monitor your lab values. Today   SUBJECTIVE   No new complaints  VITAL SIGNS:  Blood pressure 136/63, pulse 83, temperature 97.7 F (36.5 C), temperature source Oral, resp. rate (!) 21, weight 90.2 kg, SpO2 95 %.  I/O:    Intake/Output Summary (Last 24 hours) at 06/23/2020 1343 Last data filed at 06/23/2020 0500 Gross per 24 hour  Intake 79.72 ml  Output 150 ml  Net -70.28 ml    PHYSICAL EXAMINATION:  GENERAL:  70 y.o.-year-old patient lying in the bed with no acute distress.  LUNGS: Normal breath sounds bilaterally, no wheezing, rales,rhonchi or crepitation. No use of accessory muscles of respiration.  CARDIOVASCULAR: S1, S2 normal. No murmurs, rubs, or gallops.  ABDOMEN: Soft, non-tender, non-distended. Bowel sounds present. No organomegaly or mass.  EXTREMITIES: No pedal edema, cyanosis, or clubbing.  NEUROLOGIC: Cranial nerves II through XII are intact. Muscle strength 5/5 in all extremities. Sensation intact. Gait not checked. Mild slurred speech. o focal weakness PSYCHIATRIC: The patient is alert and oriented x 3.  SKIN: No obvious rash, lesion, or ulcer.   DATA REVIEW:   CBC  Recent Labs  Lab 06/21/20 0408  WBC 16.6*  HGB 10.9*  HCT 37.2  PLT 337    Chemistries  Recent Labs  Lab 06/20/20 2218 06/21/20 0408  NA 141 140  K 3.8 4.0  CL 98 99  CO2 33* 31  GLUCOSE 131* 196*  BUN 20 19  CREATININE 0.89 0.85  CALCIUM 9.1 8.8*  MG  --  2.0  AST 15  --   ALT 10  --   ALKPHOS 92  --   BILITOT 0.4  --     Microbiology Results   Recent Results (from the past 240 hour(s))  Resp Panel by RT-PCR (Flu A&B, Covid) Nasopharyngeal Swab     Status: None    Collection Time: 06/20/20 12:27 AM   Specimen: Nasopharyngeal Swab; Nasopharyngeal(NP) swabs in vial transport medium  Result Value Ref Range Status   SARS Coronavirus 2 by RT PCR NEGATIVE NEGATIVE Final    Comment: (NOTE) SARS-CoV-2 target nucleic acids are NOT DETECTED.  The SARS-CoV-2 RNA is generally detectable in upper respiratory specimens during the acute phase of infection. The lowest concentration of SARS-CoV-2 viral copies this assay can detect is 138 copies/mL.  A negative result does not preclude SARS-Cov-2 infection and should not be used as the sole basis for treatment or other patient management decisions. A negative result may occur with  improper specimen collection/handling, submission of specimen other than nasopharyngeal swab, presence of viral mutation(s) within the areas targeted by this assay, and inadequate number of viral copies(<138 copies/mL). A negative result must be combined with clinical observations, patient history, and epidemiological information. The expected result is Negative.  Fact Sheet for Patients:  EntrepreneurPulse.com.au  Fact Sheet for Healthcare Providers:  IncredibleEmployment.be  This test is no t yet approved or cleared by the [place] FDA and  has been authorized for detection and/or diagnosis of SARS-CoV-2 by FDA under an Emergency Use Authorization (EUA). This EUA will remain  in effect (meaning this test can be used) for the duration of the COVID-19 declaration under Section 564(b)(1) of the Act, 21 U.S.C.section 360bbb-3(b)(1), unless the authorization is terminated  or revoked sooner.       Influenza A by PCR NEGATIVE NEGATIVE Final   Influenza B by PCR NEGATIVE NEGATIVE Final    Comment: (NOTE) The Xpert Xpress SARS-CoV-2/FLU/RSV plus assay is intended as an aid in the diagnosis of influenza from Nasopharyngeal swab specimens and should not be used as a sole basis for treatment.  Nasal washings and aspirates are unacceptable for Xpert Xpress SARS-CoV-2/FLU/RSV testing.  Fact Sheet for Patients: EntrepreneurPulse.com.au  Fact Sheet for Healthcare Providers: IncredibleEmployment.be  This test is not yet approved or cleared by the [place] FDA and has been authorized for detection and/or diagnosis of SARS-CoV-2 by FDA under an Emergency Use Authorization (EUA). This EUA will remain in effect (meaning this test can be used) for the duration of the COVID-19 declaration under Section 564(b)(1) of the Act, 21 U.S.C. section 360bbb-3(b)(1), unless the authorization is terminated or revoked.  Performed at [facility], [address] [place], [place] [address]   MRSA PCR Screening     Status: None   Collection Time: 06/21/20  2:28 AM   Specimen: Nasopharyngeal  Result Value Ref Range Status   MRSA by PCR NEGATIVE NEGATIVE Final    Comment:        The GeneXpert MRSA Assay (FDA approved for NASAL specimens only), is one component of a comprehensive MRSA colonization surveillance program. It is not intended to diagnose MRSA infection nor to guide or monitor treatment for MRSA infections. Performed at [facility], [address] [place], [place] [address]     RADIOLOGY:  CT HEAD WO CONTRAST  Result Date: 06/21/2020 CLINICAL DATA:  Stroke follow-up EXAM: CT HEAD WITHOUT CONTRAST TECHNIQUE: Contiguous axial images were obtained from the base of the skull through the vertex without intravenous contrast. COMPARISON:  None. FINDINGS: Brain: There is no mass, hemorrhage or extra-axial collection. The size and configuration of the ventricles and extra-axial CSF spaces are normal. There is hypoattenuation of the white matter, most commonly indicating chronic small vessel disease. Vascular: No abnormal hyperdensity of the major intracranial arteries or dural venous sinuses. No intracranial atherosclerosis.  Skull: The visualized skull base, calvarium and extracranial soft tissues are normal. Sinuses/Orbits: No fluid levels or advanced mucosal thickening of the visualized paranasal sinuses. No mastoid or middle ear effusion. The orbits are normal. IMPRESSION: Chronic small vessel disease without acute intracranial abnormality. Electronically Signed   By: Ulyses Jarred M.D.   On: 06/21/2020 22:38   ECHOCARDIOGRAM COMPLETE  Result Date: 06/22/2020    ECHOCARDIOGRAM REPORT   Patient Name:  Stacy Grimes Date of Exam: 06/22/2020 Medical Rec #:  [id number]        Height:       63.0 in Accession #:    [id number]       Weight:       196.9 lb Date of Birth:  [birth year]         BSA:          1.921 m Patient Age:    37 years         BP:           144/74 mmHg Patient Gender: F                HR:           85 bpm. Exam Location:  [facility] Procedure: 2D Echo, Cardiac Doppler and Color Doppler Indications:     Stroke I63.9  History:         Patient has prior history of Echocardiogram examinations, most                  recent 12/16/2019. COPD, Signs/Symptoms:Shortness of Breath;                  Risk Factors:Hypertension.  Sonographer:     Sherrie Sport RDCS (AE) Referring Phys:  Stuart OUMA Diagnosing Phys: Ida Rogue MD  Sonographer Comments: No apical window, no subcostal window and Technically challenging study due to limited acoustic windows. Image acquisition challenging due to COPD. IMPRESSIONS  1. Challenging images  2. Left ventricular ejection fraction, by estimation, is <20%. The left ventricle has severely decreased function. The left ventricle has no regional wall motion abnormalities. The left ventricular internal cavity size was moderately dilated. Left ventricular diastolic parameters are indeterminate.  3. Right ventricular systolic function is normal. The right ventricular size is normal. FINDINGS  Left Ventricle: Left ventricular ejection fraction, by estimation, is <20%. The left ventricle has  severely decreased function. The left ventricle has no regional wall motion abnormalities. The left ventricular internal cavity size was moderately dilated. There is no left ventricular hypertrophy. Left ventricular diastolic parameters are indeterminate. Right Ventricle: The right ventricular size is normal. No increase in right ventricular wall thickness. Right ventricular systolic function is normal. Left Atrium: Left atrial size was normal in size. Right Atrium: Right atrial size was normal in size. Pericardium: There is no evidence of pericardial effusion. Mitral Valve: The mitral valve was not well visualized. Trivial mitral valve regurgitation. No evidence of mitral valve stenosis. Tricuspid Valve: The tricuspid valve is not well visualized. Tricuspid valve regurgitation is not demonstrated. No evidence of tricuspid stenosis. Aortic Valve: The aortic valve was not well visualized. Aortic valve regurgitation is not visualized. No aortic stenosis is present. Pulmonic Valve: The pulmonic valve was not well visualized. Pulmonic valve regurgitation is not visualized. No evidence of pulmonic stenosis. Aorta: The aortic root is normal in size and structure. Venous: The pulmonary veins were not well visualized. The inferior vena cava is normal in size with greater than 50% respiratory variability, suggesting right atrial pressure of 3 mmHg. IAS/Shunts: No atrial level shunt detected by color flow Doppler. Additional Comments: A device lead is visualized.  LEFT VENTRICLE PLAX 2D LVIDd:         6.23 cm LVIDs:         5.54 cm LV PW:         1.24 cm LV IVS:        1.06 cm  LVOT diam:     2.00 cm LVOT Area:     3.14 cm  LEFT ATRIUM         Index LA diam:    3.70 cm 1.93 cm/m                        PULMONIC VALVE AORTA                 PV Vmax:        0.90 m/s Ao Root diam: 2.90 cm PV Peak grad:   3.2 mmHg                       RVOT Peak grad: 2 mmHg   SHUNTS Systemic Diam: 2.00 cm Ida Rogue MD Electronically signed  by Ida Rogue MD Signature Date/Time: 06/22/2020/8:00:37 PM    Final      CODE STATUS:     Code Status Orders  (From admission, onward)         Start     Ordered   06/21/20 0124  Full code  Continuous        06/21/20 0123        Code Status History    Date Active Date Inactive Code Status Order ID Comments User Context   06/21/2020 0123 06/21/2020 0123 Full Code [id number]  Lang Snow, NP ED   06/21/2020 0123 06/21/2020 0123 Full Code [id number]  Lang Snow, NP ED   12/15/2019 2209 12/21/2019 2222 Full Code [id number]  Val Riles, MD Inpatient   04/10/2018 0029 04/12/2018 1924 Full Code [id number]  Lance Coon, MD Inpatient   09/17/2016 1152 09/17/2016 1631 Full Code [id number]  Deboraha Sprang, MD Inpatient   03/28/2012 2328 03/30/2012 1950 Full Code [id number]  Shanda Howells, MD Inpatient   Advance Care Planning Activity       TOTAL TIME TAKING CARE OF THIS PATIENT: *35* minutes.    Fritzi Mandes M.D  [facility]    CC: Primary care physician; Cyndi Bender, PA-C

## 2020-06-25 ENCOUNTER — Encounter: Payer: Self-pay | Admitting: Cardiovascular Disease

## 2020-07-04 ENCOUNTER — Ambulatory Visit: Payer: Medicare Other | Admitting: Physician Assistant

## 2020-07-06 ENCOUNTER — Ambulatory Visit (INDEPENDENT_AMBULATORY_CARE_PROVIDER_SITE_OTHER): Payer: Medicare Other

## 2020-07-06 DIAGNOSIS — I420 Dilated cardiomyopathy: Secondary | ICD-10-CM | POA: Diagnosis not present

## 2020-07-06 LAB — CUP PACEART REMOTE DEVICE CHECK
Battery Remaining Longevity: 36 mo
Battery Remaining Percentage: 45 %
Battery Voltage: 2.93 V
Brady Statistic AP VP Percent: 1 %
Brady Statistic AP VS Percent: 1 %
Brady Statistic AS VP Percent: 99 %
Brady Statistic AS VS Percent: 1 %
Brady Statistic RA Percent Paced: 1 %
Date Time Interrogation Session: 20220602030238
HighPow Impedance: 56 Ohm
HighPow Impedance: 56 Ohm
Implantable Lead Implant Date: 20110309
Implantable Lead Implant Date: 20110309
Implantable Lead Implant Date: 20110309
Implantable Lead Location: 753858
Implantable Lead Location: 753859
Implantable Lead Location: 753860
Implantable Lead Model: 7121
Implantable Pulse Generator Implant Date: 20180814
Lead Channel Impedance Value: 360 Ohm
Lead Channel Impedance Value: 450 Ohm
Lead Channel Impedance Value: 630 Ohm
Lead Channel Pacing Threshold Amplitude: 0.75 V
Lead Channel Pacing Threshold Amplitude: 0.75 V
Lead Channel Pacing Threshold Amplitude: 0.875 V
Lead Channel Pacing Threshold Pulse Width: 0.5 ms
Lead Channel Pacing Threshold Pulse Width: 0.5 ms
Lead Channel Pacing Threshold Pulse Width: 0.5 ms
Lead Channel Sensing Intrinsic Amplitude: 11.7 mV
Lead Channel Sensing Intrinsic Amplitude: 5 mV
Lead Channel Setting Pacing Amplitude: 2 V
Lead Channel Setting Pacing Amplitude: 2.25 V
Lead Channel Setting Pacing Amplitude: 2.5 V
Lead Channel Setting Pacing Pulse Width: 0.5 ms
Lead Channel Setting Pacing Pulse Width: 0.5 ms
Lead Channel Setting Sensing Sensitivity: 0.5 mV
Pulse Gen Serial Number: 9761609

## 2020-07-11 NOTE — Progress Notes (Deleted)
Cardiology Office Note    Date:  07/11/2020   ID:  [name], DOB [birth year], MRN [id number]  PCP:  Cyndi Bender, PA-C  Cardiologist:  Kathlyn Sacramento, MD  Electrophysiologist:  Virl Axe, MD   Chief Complaint: Hospital follow up  History of Present Illness:   Stacy Grimes is a 70 y.o. female with history of HFrEF secondary to NICM status post ICD placement in 04/2009 status post CRT-D in 09/2016 followed by by Dr. 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  Prior LHC in 2010 showed an EF of 15 to 20% with no evidence of obstructive disease.  She had improvement in LV systolic function to the low normal range with medical management by echo in 2018.  She was admitted to the hospital in 2020 with syncope felt to be in the setting of hypotension with associated dehydration.  Echo at that time showed an EF of 40 to 45%, normal RV systolic function with mildly enlarged RV cavity size, trivial pericardial effusion, and no significant valvular abnormalities.  Symptoms improved with holding of diuretic.  She was evaluated in the office in 04/2018 with noted volume overload leading to the resumption of lower dose of furosemide.    She was admitted in 12/2019 with acute on chronic hypoxic respiratory failure in the setting of not using supplemental oxygen and acute left tibial spine fracture from a recent fall.  High-sensitivity troponin was minimally elevated peaking at 84 without anginal symptoms.  Echo showed an EF of 35 to 40%, global hypokinesis, normal RV systolic function and ventricular cavity size, and no significant valvular abnormalities.  She was last seen in the office in 03/2020 and was doing reasonably well from a cardiac perspective.  It  was noted she has not been maintained on an ACE inhibitor or ARB due to prior episode of severe hypotension with associated ARF.  She was admitted to the hospital from 5/17 through 5/20 with acute CVA.  Initial CT head showed chronic small vessel disease without acute intracranial pathology.  Given that she presented with left facial droop, slurred speech, and weakness, telemetry neurology was consulted and she was treated with tPA with symptomatic improvement.  CTA head and neck showed no emergent large vessel occlusion or high-grade stenosis of the intracranial arteries with normal CT perfusion.  Repeat head CT again showed chronic small vessel disease without acute intracranial abnormality.  Echo showed an EF of less than 20%, moderately dilated LV cavity size, normal RV systolic function and ventricular cavity size, trivial mitral regurgitation, and an estimated right atrial pressure of 3 mmHg.  TEE showed an EF of 20 to 25%, global hypokinesis, moderately dilated LV internal cavity size, moderately reduced RV systolic function with normal ventricular cavity size, mildly dilated left atrium with no left atrial/left atrial appendage thrombus detected, mild mitral regurgitation, negative saline contrast bubble study, moderate, grade 3, atheroma plaque involving the transverse and descending aorta, unable to visualize ICD wire well, and no obvious mural thrombus.  Documentation noted prior device interrogations without evidence of A. Fib, with atrial tachycardia noted.  ***   Labs independently reviewed: 06/2020 - magnesium 2.0, potassium 4.0, BUN 19, serum creatinine 0.85, albumin 3.1, AST/ALT normal Hgb 10.9, PLT 337, TC 221, TG 109, HDL 60, LDL 39, A1c 7.5  06/2017 - TSH normal   Past Medical History:  Diagnosis Date  . AAA (abdominal aortic aneurysm) ([place])   . Anginal pain ([place])   . Anxiety   . Arthritis   . Automatic implantable cardioverter-defibrillator in situ   . Biventricular ICD  [employer]    . COPD (chronic obstructive pulmonary disease) ([place])   . Deafness    left ear  . Dementia ([place])   . Depression   . Dizziness   . Dysrhythmia   . Fracture    history of spinal fracture  . GERD (gastroesophageal reflux disease)   . HFrEF (heart failure with reduced ejection fraction) ([place])    a. 2010 Cath: nonobs dzs, EF 15-20%; b. 09/2017 Echo: EF suboptimal. EF low nl; c. 04/2018 Echo: EF 40-45%.  . Hypertension   . Hypothyroidism   . Non-ischemic cardiomyopathy ([place])    a. 2010 Cath: nonobs dzs, EF 15-20%; b. 2011 s/p SJM CRT-D; c. 09/2016 s/p Gen change; d. 09/2017 Echo: EF suboptimal. EF low nl; e. 04/2018 Echo: EF 40-45%.  . Oxygen dependent   . Palpitations   . Shortness of breath dyspnea   . Sleep apnea   . Wheezing     Past Surgical History:  Procedure Laterality Date  . ABDOMINAL HYSTERECTOMY    . BIV ICD GENERATOR CHANGEOUT N/A 09/17/2016   Procedure: BiV ICD Generator Changeout;  Surgeon: Deboraha Sprang, MD;  Location: [facility];  Service: Cardiovascular;  Laterality: N/A;  . BLADDER SURGERY    . CARDIAC CATHETERIZATION    . CARDIAC DEFIBRILLATOR PLACEMENT  4 yrs ago   pacemaker  . CATARACT EXTRACTION W/PHACO Left 09/01/2014   Procedure: CATARACT EXTRACTION PHACO AND INTRAOCULAR LENS PLACEMENT (IOC);  Surgeon: Lyla Glassing, MD;  Location: [facility];  Service: Ophthalmology;  Laterality: Left;  [place]: 1:12.1   . CATARACT EXTRACTION W/PHACO Right 09/29/2014   Procedure: CATARACT EXTRACTION PHACO AND INTRAOCULAR LENS PLACEMENT (IOC);  Surgeon: Lyla Glassing, MD;  Location: [facility];  Service: Ophthalmology;  Laterality: Right;  [place]: 00:52.7   . CHOLECYSTECTOMY    . COLON SURGERY    . COLONOSCOPY    . COLONOSCOPY N/A 08/31/2012   Procedure: COLONOSCOPY;  Surgeon: Inda Castle, MD;  Location: [facility];  Service: Endoscopy;  Laterality: N/A;  . ESOPHAGOGASTRODUODENOSCOPY N/A 07/06/2012   Procedure: ESOPHAGOGASTRODUODENOSCOPY (EGD);  Surgeon: Lafayette Dragon, MD;   Location: [facility];  Service: Endoscopy;  Laterality: N/A;  . KNEE SURGERY Right   . MIDDLE EAR SURGERY    . SAVORY DILATION N/A 07/06/2012   Procedure: SAVORY DILATION;  Surgeon: Lafayette Dragon, MD;  Location: [facility];  Service: Endoscopy;  Laterality: N/A;  . TEE WITHOUT CARDIOVERSION N/A 06/23/2020   Procedure: TRANSESOPHAGEAL ECHOCARDIOGRAM (TEE);  Surgeon: Minna Merritts, MD;  Location: [facility];  Service: Cardiovascular;  Laterality: N/A;  . WRIST SURGERY Right     Current Medications: No outpatient medications have been marked as taking for the 07/12/20 encounter (Appointment) with Rise Mu, PA-C.    Allergies:   Codeine and Contrast media [iodinated diagnostic agents]   Social History   Socioeconomic History  . Marital status: Married    Spouse name: Not on file  . Number of children: 3  . Years of education: Not on file  . Highest education level: Not on file  Occupational History  . Occupation: Retired    Fish farm manager: DISABLED  Tobacco Use  . Smoking status: Former Smoker    Packs/day:  3.00    Years: 20.00    Pack years: 60.00    Types: Cigarettes    Quit date: 02/04/2002    Years since quitting: 18.4  . Smokeless tobacco: Never Used  Substance and Sexual Activity  . Alcohol use: No  . Drug use: No  . Sexual activity: Not on file  Other Topics Concern  . Not on file  Social History Narrative  . Not on file   Social Determinants of Health   Financial Resource Strain: Not on file  Food Insecurity: Not on file  Transportation Needs: Not on file  Physical Activity: Not on file  Stress: Not on file  Social Connections: Not on file     Family History:  The patient's family history includes Breast cancer in her mother; Colon cancer (age of onset: 88) in her brother; Heart attack in her sister and sister; Heart disease in her father; Hypertension in her brother and mother; Stroke in her mother.  ROS:   ROS   EKGs/Labs/Other Studies Reviewed:     Studies reviewed were summarized above. The additional studies were reviewed today:  TEE 06/23/2020: 1. Left ventricular ejection fraction, by estimation, is 20 to 25%. The  left ventricle has severely decreased function. The left ventricle  demonstrates global hypokinesis. The left ventricular internal cavity size  was moderately dilated.  2. Right ventricular systolic function is moderately reduced. The right  ventricular size is normal.  3. Left atrial size was mildly dilated. No left atrial/left atrial  appendage thrombus was detected.  4. The mitral valve is normal in structure. Mild mitral valve  regurgitation.  5. Agitated saline contrast bubble study was negative, with no evidence  of any interatrial shunt.  6. There is Moderate (Grade III) atheroma plaque involving the transverse  and descending aorta.  __________  2D echo 06/22/2020: 1. Challenging images  2. Left ventricular ejection fraction, by estimation, is <20%. The left  ventricle has severely decreased function. The left ventricle has no  regional wall motion abnormalities. The left ventricular internal cavity  size was moderately dilated. Left  ventricular diastolic parameters are indeterminate.  3. Right ventricular systolic function is normal. The right ventricular  size is normal.  __________  2D echo 12/16/2019: 1. Left ventricular ejection fraction, by estimation, is 35 to 40%. The  left ventricle has moderately decreased function. The left ventricle  demonstrates global hypokinesis. Indeterminate diastolic filling due to  E-A fusion.  2. Right ventricular systolic function is normal. The right ventricular  size is normal. Tricuspid regurgitation signal is inadequate for assessing  PA pressure.  3. The mitral valve is normal in structure. No evidence of mitral valve  regurgitation. No evidence of mitral stenosis. __________  2D echo 04/10/2018: 1. Stage 1: 1: Basal and mid anterior septum is  abnormal.  2. The left ventricle has mild-moderately reduced systolic function, with  an ejection fraction of 40-45%. The cavity size was normal. Left  ventricular diastology could not be evaluated.  3. The right ventricle has normal systolic function. The cavity was  mildly enlarged. There is mildly increased right ventricular wall  thickness. Right ventricular systolic pressure could not be assessed.  4. Trivial pericardial effusion is present.  5. The mitral valve is normal in structure.  6. The aortic valve is tricuspid.  7. The aortic root is normal in size and structure.  8. The interatrial septum was not well visualized. __________  2D echo 09/30/2016: - Procedure narrative: Transthoracic echocardiography. Image  quality was suboptimal. The study was technically difficult.  Intravenous contrast (Definity) was administered.  - Left ventricle: The cavity size was normal. There was an  increased relative contribution of atrial contraction to  ventricular filling. Doppler parameters are consistent with  abnormal left ventricular relaxation (grade 1 diastolic  dysfunction). Doppler parameters are consistent with high  ventricular filling pressure.  - Aortic valve: Poorly visualized.  - Left atrium: Poorly visualized.  - Right ventricle: Poorly visualized.  - Pulmonic valve: Poorly visualized.   Impressions:   - Very suboptimal 2D echo with poor acoustical windows. Overall LVF  appears to be at least low normal with possible inferoseptal HK.  Consider cardiac MRI for better assessment of wall motion and  valves.    EKG:  EKG is ordered today.  The EKG ordered today demonstrates ***  Recent Labs: 12/15/2019: B Natriuretic Peptide 82.4 06/20/2020: ALT 10 06/21/2020: BUN 19; Creatinine, Ser 0.85; Hemoglobin 10.9; Magnesium 2.0; Platelets 337; Potassium 4.0; Sodium 140  Recent Lipid Panel    Component Value Date/Time   CHOL 221 (H) 06/21/2020 0408    TRIG 109 06/21/2020 0408   HDL 60 06/21/2020 0408   CHOLHDL 3.7 06/21/2020 0408   VLDL 22 06/21/2020 0408   LDLCALC 139 (H) 06/21/2020 0408    PHYSICAL EXAM:    VS:  There were no vitals taken for this visit.  BMI: There is no height or weight on file to calculate BMI.  Physical Exam  Wt Readings from Last 3 Encounters:  06/23/20 198 lb 13.7 oz (90.2 kg)  03/16/20 191 lb (86.6 kg)  12/17/19 202 lb 12.8 oz (92 kg)     ASSESSMENT & PLAN:   1. BiV failure/NICM status post CRT-D:  2. History of presumed CVA:  3. Atrial tachycardia:  4. Aortic atherosclerosis: TEE demonstrated grade 3 atheroma plaque involving the transverse and descending aorta, felt to possibly be contributing to her presumed CVA.  LDL 39 during recent admission.  ***  5. AAA:  6. HTN: Blood pressure ***  7. Severe COPD:  8. Dementia:  Disposition: F/u with Dr. Fletcher Anon or an APP in ***, and EP as directed.   Medication Adjustments/Labs and Tests Ordered: Current medicines are reviewed at length with the patient today.  Concerns regarding medicines are outlined above. Medication changes, Labs and Tests ordered today are summarized above and listed in the Patient Instructions accessible in Encounters.   Signed, Christell Faith, PA-C 07/11/2020 4:26 PM     [facility] [address] [place] [address] [place], [place] [address] [phone]

## 2020-07-12 ENCOUNTER — Ambulatory Visit: Payer: Medicare Other | Admitting: Physician Assistant

## 2020-07-17 ENCOUNTER — Telehealth: Payer: Self-pay

## 2020-07-17 ENCOUNTER — Ambulatory Visit (INDEPENDENT_AMBULATORY_CARE_PROVIDER_SITE_OTHER): Payer: Medicare Other

## 2020-07-17 DIAGNOSIS — Z95810 Presence of automatic (implantable) cardiac defibrillator: Secondary | ICD-10-CM | POA: Diagnosis not present

## 2020-07-17 DIAGNOSIS — I5022 Chronic systolic (congestive) heart failure: Secondary | ICD-10-CM

## 2020-07-17 NOTE — Telephone Encounter (Signed)
Remote ICM transmission received.  Attempted call to husband regarding ICM remote transmission and left detailed message per DPR.  Advised to return call for any fluid symptoms or questions. Next ICM remote transmission scheduled 07/25/2020.

## 2020-07-17 NOTE — Progress Notes (Signed)
EPIC Encounter for ICM Monitoring  Patient Name: Stacy Grimes is a 70 y.o. female Date: 07/17/2020 Primary Care Physican: Cyndi Bender, PA-C Primary Cardiologist: Fletcher Anon Electrophysiologist: Vergie Living Pacing:  >99%    03/16/2020 Weight: 191 lbs   AT/AF Burden <1% (taking Aspirin)   Attempted call to husband and unable to reach.  Left detailed message per DPR regarding transmission. Transmission reviewed.   Coruve thoracic impedance suggesting possible dryness starting 6/9.    Prescribed: Furosemide 20 mg 1 tablet by mouth as needed for fluid or edema.       Labs: 06/21/2020 Creatinine 0.85, BUN 19, Potassium 4.0, Sodium 140 06/20/2020 Creatinine 0.89, BUN 20, Potassium 3.8, Sodium 141, GFR >60  A complete set of results can be found in Results Review.  A complete set of results can be found in Results Review.   Recommendations: Left voice mail with ICM number and encouraged to call if experiencing any fluid symptoms.   Follow-up plan: ICM clinic phone appointment on 07/17/2020.  91 day device clinic remote transmission scheduled 07/06/2020.     EP/Cardiology Office Visits:   07/18/2020 with Marrianne Mood, [place].  09/05/2020 with Dr Fletcher Anon.  Spouse aware he needs to call office to schedule over due visit with Dr Suzzanne Cloud OV with Dr Caryl Comes was 07/16/2017)   Copy of ICM check sent to Dr. Caryl Comes.   3 month ICM trend: 07/17/2020.    1 Year ICM trend:       [name], RN 07/17/2020 4:03 PM

## 2020-07-18 ENCOUNTER — Other Ambulatory Visit: Payer: Self-pay

## 2020-07-18 ENCOUNTER — Ambulatory Visit (INDEPENDENT_AMBULATORY_CARE_PROVIDER_SITE_OTHER): Payer: Medicare Other | Admitting: Physician Assistant

## 2020-07-18 VITALS — BP 106/62 | HR 85 | Ht 63.0 in | Wt 188.0 lb

## 2020-07-18 DIAGNOSIS — Z79899 Other long term (current) drug therapy: Secondary | ICD-10-CM | POA: Diagnosis not present

## 2020-07-18 DIAGNOSIS — I428 Other cardiomyopathies: Secondary | ICD-10-CM

## 2020-07-18 DIAGNOSIS — I10 Essential (primary) hypertension: Secondary | ICD-10-CM | POA: Diagnosis not present

## 2020-07-18 DIAGNOSIS — I9589 Other hypotension: Secondary | ICD-10-CM

## 2020-07-18 DIAGNOSIS — Z95810 Presence of automatic (implantable) cardiac defibrillator: Secondary | ICD-10-CM | POA: Diagnosis not present

## 2020-07-18 DIAGNOSIS — J449 Chronic obstructive pulmonary disease, unspecified: Secondary | ICD-10-CM | POA: Diagnosis not present

## 2020-07-18 DIAGNOSIS — Z8673 Personal history of transient ischemic attack (TIA), and cerebral infarction without residual deficits: Secondary | ICD-10-CM

## 2020-07-18 DIAGNOSIS — I700 Atherosclerosis of aorta: Secondary | ICD-10-CM | POA: Diagnosis not present

## 2020-07-18 DIAGNOSIS — E785 Hyperlipidemia, unspecified: Secondary | ICD-10-CM | POA: Diagnosis not present

## 2020-07-18 DIAGNOSIS — E861 Hypovolemia: Secondary | ICD-10-CM

## 2020-07-18 DIAGNOSIS — I951 Orthostatic hypotension: Secondary | ICD-10-CM | POA: Diagnosis not present

## 2020-07-18 DIAGNOSIS — F0390 Unspecified dementia without behavioral disturbance: Secondary | ICD-10-CM

## 2020-07-18 DIAGNOSIS — I639 Cerebral infarction, unspecified: Secondary | ICD-10-CM

## 2020-07-18 DIAGNOSIS — E1165 Type 2 diabetes mellitus with hyperglycemia: Secondary | ICD-10-CM

## 2020-07-18 DIAGNOSIS — I5022 Chronic systolic (congestive) heart failure: Secondary | ICD-10-CM | POA: Diagnosis not present

## 2020-07-18 MED ORDER — ATORVASTATIN CALCIUM 80 MG PO TABS: 80.0000 mg | ORAL_TABLET | Freq: Every day | ORAL | 3 refills | Status: DC

## 2020-07-18 MED ORDER — FUROSEMIDE 20 MG PO TABS: 20.0000 mg | ORAL_TABLET | ORAL | 3 refills | Status: DC

## 2020-07-18 NOTE — Patient Instructions (Signed)
  Medication Instructions:  Your physician has recommended you make the following change in your medication:   INCREASE Atorvastatin to 80mg  daily  CHANGE Furosemide to 20mg  EVERY OTHER DAY   - MAY take an Additional Furosemide 20mg  tablet AS NEEDED for weight gain of 3lbs overnight or 5lbs in one week. DO NOT EXCEED MORE THAN 3 EXTRA DOSES AS NEEDED.   *If you need a refill on your cardiac medications before your next appointment, please call your pharmacy*   Lab Work:  BMET, [facility] today  Your physician recommends that you return for lab work in: 6-8 weeks (Lipid/ LFT) -  Please go to the [facility]. You will check in at the front desk to the right as you walk into the atrium. Valet Parking is offered if needed. - No appointment needed. You may go any day between 7 am and 6 pm.   Testing/Procedures: None ordered   Follow-Up: At [facility], you and your health needs are our priority.  As part of our continuing mission to provide you with exceptional heart care, we have created designated Provider Care Teams.  These Care Teams include your primary Cardiologist (physician) and Advanced Practice Providers (APPs -  Physician Assistants and Nurse Practitioners) who all work together to provide you with the care you need, when you need it.  We recommend signing up for the patient portal called "MyChart".  Sign up information is provided on this After Visit Summary.  MyChart is used to connect with patients for Virtual Visits (Telemedicine).  Patients are able to view lab/test results, encounter notes, upcoming appointments, etc.  Non-urgent messages can be sent to your provider as well.   To learn more about what you can do with MyChart, go to [email or web address].    Your next appointment:    Follow up with Dr. Caryl Comes first available  We have scheduled an appt with your PCP Dr. Tobie Lords for 08/14/20 at 10:30AM

## 2020-07-18 NOTE — Progress Notes (Signed)
Office Visit    Patient Name: Stacy Grimes Date of Encounter: 07/18/2020  PCP:  Conroy, Nathan, [place]  Cardiologist:  Kathlyn Sacramento, MD  Advanced Practice Provider:  No care team member to display Electrophysiologist:  Virl Axe, MD  :[id number]}   Chief Complaint    Chief Complaint  Patient presents with   Follow-up    S/p TEE    70 y.o. female with history of orthostatic hypotension/dizziness, NICM with ICD (Dr. Caryl Comes, [place] with change-out 2018), COPD chronically on O2, chronic HFrEF, HTN, AAA, hypothyroidism, dementia /memory loss, previous smoker with 60 pk yr history, and sleep apnea who is being seen today for review of TEE.  Past Medical History    Past Medical History:  Diagnosis Date   AAA (abdominal aortic aneurysm) (HCC)    Anginal pain ([place])    Anxiety    Arthritis    Automatic implantable cardioverter-defibrillator in situ    Biventricular ICD  [place]    COPD (chronic obstructive pulmonary disease) (HCC)    Deafness    left ear   Dementia (HCC)    Depression    Dizziness    Dysrhythmia    Fracture    history of spinal fracture   GERD (gastroesophageal reflux disease)    HFrEF (heart failure with reduced ejection fraction) ([name])    a. 2010 Cath: nonobs dzs, EF 15-20%; b. 09/2017 Echo: EF suboptimal. EF low nl; c. 04/2018 Echo: EF 40-45%.   Hypertension    Hypothyroidism    Non-ischemic cardiomyopathy ([place])    a. 2010 Cath: nonobs dzs, EF 15-20%; b. 2011 s/p SJM CRT-D; c. 09/2016 s/p Gen change; d. 09/2017 Echo: EF suboptimal. EF low nl; e. 04/2018 Echo: EF 40-45%.   Oxygen dependent    Palpitations    Shortness of breath dyspnea    Sleep apnea    Wheezing    Past Surgical History:  Procedure Laterality Date   ABDOMINAL HYSTERECTOMY     BIV ICD GENERATOR CHANGEOUT N/A 09/17/2016   Procedure: BiV ICD Generator Changeout;  Surgeon: Deboraha Sprang, MD;  Location: [facility];  Service:  Cardiovascular;  Laterality: N/A;   BLADDER SURGERY     CARDIAC CATHETERIZATION     CARDIAC DEFIBRILLATOR PLACEMENT  4 yrs ago   pacemaker   CATARACT EXTRACTION W/PHACO Left 09/01/2014   Procedure: CATARACT EXTRACTION PHACO AND INTRAOCULAR LENS PLACEMENT ([place]);  Surgeon: Lyla Glassing, MD;  Location: [facility];  Service: Ophthalmology;  Laterality: Left;  [place]: 1:12.1    CATARACT EXTRACTION W/PHACO Right 09/29/2014   Procedure: CATARACT EXTRACTION PHACO AND INTRAOCULAR LENS PLACEMENT (I[facility]);  Surgeon: Lyla Glassing, MD;  Location: [facility];  Service: Ophthalmology;  Laterality: Right;  [place]: 00:52.7    CHOLECYSTECTOMY     COLON SURGERY     COLONOSCOPY     COLONOSCOPY N/A 08/31/2012   Procedure: COLONOSCOPY;  Surgeon: Inda Castle, MD;  Location: [facility];  Service: Endoscopy;  Laterality: N/A;   ESOPHAGOGASTRODUODENOSCOPY N/A 07/06/2012   Procedure: ESOPHAGOGASTRODUODENOSCOPY (EGD);  Surgeon: Lafayette Dragon, MD;  Location: [facility];  Service: Endoscopy;  Laterality: N/A;   KNEE SURGERY Right    MIDDLE EAR SURGERY     SAVORY DILATION N/A 07/06/2012   Procedure: SAVORY DILATION;  Surgeon: Lafayette Dragon, MD;  Location: [facility];  Service: Endoscopy;  Laterality: N/A;   TEE WITHOUT CARDIOVERSION N/A 06/23/2020   Procedure: TRANSESOPHAGEAL ECHOCARDIOGRAM (TEE);  Surgeon: Minna Merritts, MD;  Location: [facility];  Service: Cardiovascular;  Laterality: N/A;   WRIST SURGERY Right     Allergies  Allergies  Allergen Reactions   Codeine Palpitations   Contrast Media [Iodinated Diagnostic Agents] Rash    History of Present Illness    Stacy Grimes is a 70 yo female with PMH as above. She has a remote history of smoking but does not drink or use drugs. Most of history obtained via EMR and husband as patient reportedly has progressive memory loss / poor memory with family reporting this is her baseline. Per EMR, patient has a history of orthostatic hypotension and feeling of presyncope  including dizziness/lightheadedness.  She is chronically SOB with DOE and on chronic O2. She also has documented chronic chest pain dating back many years that is not exertional or positional documented as felt to be musculoskeletal.    2010 cardiac catheterization showed EF 15-20% without obstructive CAD.  Repeat echo 2018 (ruled a limited study) suggestive of normal to low left ventricular ejection fraction. ICD implant 2011 by Dr. Caryl Comes for NICM [facility] 2011 CRT-D; BiV ICD generator changeo-ut 8/147/2018). Followed by Dr. Caryl Comes with most recent visit 07/16/2017 for follow-up of ICD. At that time, no noted chest pain.  She had chronic mild edema but was noted to be euvolemic at the time of her visit.  She was also documented as extremely short of breath, tachycardic, and hypoxic with minimal exertion. With ambulation, her heart rate was 130s bpm and oxygen saturations 80s.  It was recommended she follow-up urgently with her pulmonary physician, whom she had not seen in over a year.  ICD interrogation reviewed with noted AT with AMS and no changes made to programming.  EKG was sinus at rate of 98 bpm.  Labs showed stable hemoglobin and mild hyperkalemia.   On 3/5, she presented to [facility] ED c/o syncope x2 with one episode in bed and the other near her bedroom door. She reportedly may have taken too much of her medication. She did report a cough but without associated fever, chills, nausea, or emsis. In ED, hypotensive with BP 69/58, HR 94, hypokalemic at 3.0. Cr 1.37. Lactic acid 1.4. WBC 11.0. Hgb 10.3. EKG without acute changes. CXR without acute changes. Admitted for syncope with ICD checked 3/5 and scan under CV procedures tab and stating AMS with AT above MTR (1:1 conduction) and RA oversensing. Pending repeat interrogation with representative contacted 3/6.  Etiology of syncope at that time was suspected is due to dehydration, electrolyte abnormalities, hypotension, and tachycardic rates.  Also considered was  polypharmacy.  She was seen in office at follow-up and had recurrent volume overload with furosemide resumed at lower dose 20 mg once daily.  She was then seen by her primary cardiologist earlier this year 03/16/2020 and doing well with history limited by her dementia.  Her husband provided most of the history.  She was not on an ACE inhibitor or ARB due to her previous episode of hypotension and acute renal failure.  She was taking small dose metoprolol and digoxin.  She remains on oxygen at 3 to 4 L given her severe COPD.  She was felt to be euvolemic on exam.  Resumption of small dose ARB with cautious monitoring of blood pressure and renal function was felt to be reasonable at some point in the near future.  It was noted she would be unlikely to tolerate Entresto with most recent echo showing EF 35 to 40%.  Previous escalation of GDMT has been limited by her heart failure medications.  Continued visits with Dr. Caryl Comes for her ICD was recommended.  Recently, she was admitted to [facility] with transthoracic echocardiogram performed for stroke.  She is seen today for follow-up of this transthoracic echocardiogram.  At the time of her admission, it was recommended that she follow-up with EP for possible atrial fibrillation not on anticoagulation.  At the time of discharge, anticoagulation was not started due to lack of clear evidence of atrial fibrillation with recommendation that this be discussed with Dr. Caryl Comes by the rounding cardiologist progress Notes.  Will defer to Dr. Rockey Situ and Dr. Caryl Comes conversation/updates to patient.  Today, 07/18/2020, she returns to clinic and history is provided entirely by her husband who does most of the talking today.  We attempted to review transthoracic echocardiogram in great detail.  Recommendation that patient has been follow-up with electrophysiology was discussed today, given possible atrial fibrillation not on anticoagulation.  EKG reviewed today as well.  She remains on home  oxygen at 3 to 4 L.  No reported chest pain and shortness of breath/dyspnea per husband is stable with consideration of COPD and oxygen.  No reported progressive signs or symptoms of volume overload per husband.  She continues to be dry on exam as indicated by CorVue readings and most recent telephone notes.  On review of medications, she has been taking Lasix 20 mg daily with recommendation to decrease to every other day at this time.  No reported presyncope, syncope, or recent falls per husband.  No known tachypalpitations per husband.  We reviewed risk factor modification and medication changes today.  No signs or symptoms of bleeding.  Again, ROS provided by husband.  Home Medications   Current Outpatient Medications  Medication Instructions   albuterol (ACCUNEB) 0.63 MG/3ML nebulizer solution 1 ampule, Nebulization, Every 6 hours PRN   albuterol (PROVENTIL HFA;VENTOLIN HFA) 108 (90 Base) MCG/ACT inhaler 2 puffs, Inhalation, Every 6 hours PRN   allopurinol (ZYLOPRIM) 300 mg, Oral, 2 times daily   aspirin 81 mg, Oral, Daily,     atorvastatin (LIPITOR) 40 mg, Oral, Daily   budesonide-formoterol (SYMBICORT) 160-4.5 MCG/ACT inhaler 2 puffs, Inhalation, 2 times daily   clopidogrel (PLAVIX) 75 mg, Oral, Daily   digoxin (LANOXIN) 0.125 mg, Oral, Every M-W-F   donepezil (ARICEPT) 5 mg, Oral, Daily at bedtime   furosemide (LASIX) 20 mg, Oral, Daily   gabapentin (NEURONTIN) 300 mg, Oral, 2 times daily PRN   hydrOXYzine (VISTARIL) 25 mg, Oral, At bedtime PRN   levothyroxine (SYNTHROID) 112 mcg, Oral, Daily before breakfast   Menthol, Topical Analgesic, (BENGAY EX) 1 application, Apply externally, Daily PRN   metFORMIN (GLUCOPHAGE-XR) 500 mg, Oral, 2 times daily with meals   metoprolol succinate (TOPROL-XL) 25 mg, Oral, Daily   omeprazole (PRILOSEC) 20 mg, Oral, Every morning   Sennosides (EX-LAX PO) 2 tablets, Oral, At bedtime PRN   tiotropium (SPIRIVA) 18 mcg, Inhalation, Daily PRN   venlafaxine  (EFFEXOR) 75 mg, Oral, 2 times daily   vitamin B-12 (CYANOCOBALAMIN) 1,000 mcg, Oral, 2 times daily     Review of Systems    ROS per husband. No chest pain, palpitations, increased SOB, pnd, orthopnea, n, v, dizziness, syncope, edema, weight gain, or early satiety.    All other systems reviewed and are otherwise negative except as noted above.  Physical Exam    VS:  BP 106/62   Pulse 85   Ht 5\' 3"  (1.6 m)  Wt 188 lb (85.3 kg)   BMI 33.30 kg/m  , BMI Body mass index is 33.3 kg/m. GEN: Well nourished, well developed, in no acute distress. HEENT: normal.  On oxygen. Neck: Supple, no JVD, carotid bruits, or masses. Cardiac: RRR, no murmurs, rubs, or gallops. No clubbing, cyanosis, edema.  Radials/DP/PT 2+ and equal bilaterally.  Respiratory:  Respirations regular and unlabored, clear to auscultation bilaterally with consideration of sounds associated with oxygen. GI: Soft, nontender, nondistended, BS + x 4. MS: no deformity or atrophy. Skin: warm and dry, no rash. Neuro:  Strength and sensation are intact. Psych: Normal affect.  Accessory Clinical Findings    ECG personally reviewed by me today -atrial sensed ventricular paced rhythm with atrial mode switching noted on device report given underlying atrial rhythm and normal device function by remote device check, ventricular rate 85 bpm, QTC 499 ms - no acute changes.  VITALS Reviewed today   Temp Readings from Last 3 Encounters:  06/23/20 97.7 F (36.5 C) (Oral)  12/21/19 98.6 F (37 C)  09/01/19 (!) 97.2 F (36.2 C)   BP Readings from Last 3 Encounters:  06/23/20 (!) 128/57  03/16/20 124/82  12/21/19 (!) 143/64   Pulse Readings from Last 3 Encounters:  06/23/20 82  03/16/20 93  12/21/19 94    Wt Readings from Last 3 Encounters:  06/23/20 198 lb 13.7 oz (90.2 kg)  03/16/20 191 lb (86.6 kg)  12/17/19 202 lb 12.8 oz (92 kg)     LABS  reviewed today   Lab Results  Component Value Date   WBC 16.6 (H) 06/21/2020    HGB 10.9 (L) 06/21/2020   HCT 37.2 06/21/2020   MCV 86.9 06/21/2020   PLT 337 06/21/2020   Lab Results  Component Value Date   CREATININE 0.85 06/21/2020   BUN 19 06/21/2020   NA 140 06/21/2020   K 4.0 06/21/2020   CL 99 06/21/2020   CO2 31 06/21/2020   Lab Results  Component Value Date   ALT 10 06/20/2020   AST 15 06/20/2020   ALKPHOS 92 06/20/2020   BILITOT 0.4 06/20/2020   Lab Results  Component Value Date   CHOL 221 (H) 06/21/2020   HDL 60 06/21/2020   LDLCALC 139 (H) 06/21/2020   TRIG 109 06/21/2020   CHOLHDL 3.7 06/21/2020    Lab Results  Component Value Date   HGBA1C 7.5 (H) 06/21/2020   Lab Results  Component Value Date   TSH 1.020 03/28/2016     STUDIES/PROCEDURES reviewed today           Assessment & Plan    Chronic systolic heart failure Nonischemic cardiomyopathy --ROS limited and most of history provided by husband given patient dementia.  Reviewed most recent transthoracic echocardiogram.  She was noted to be dry via recent [place] impedance monitoring with recommendation to decrease diuresis as per most recent reading.  Per husband, she is continued on Lasix 20 mg daily.  Recommend decrease to Lasix 20 mg every other day.  Discussed recommendation that obtain daily weights and increase diuresis if weight gain 3 pounds overnight or 5 pounds in a week with 1 additional Lasix 20 mg tablet.  He should call the office if no improvement in weight or symptoms after additional 1 Lasix 20 mg tablet x3 days, as repeat labs will be needed at that time and reassessment of volume status.  As previously noted, resumption of small dose ARB should be considered at some point.  Will defer for  now, given soft BP today at 106/62 and patient dry on exam.  As previously noted, she is unlikely to tolerate Entresto.  Continue digoxin and metoprolol.  Recent stroke --As above, recently admitted with stroke and with symptom improvement after tPA during this admission.   No acute stroke was noted on CT scan of head.  Transthoracic echo with severe cardiomyopathy and global hypokinesis.  TEE performed with EF 20 to 25% and global hypokinesis.  TEE without evidence of thrombus or shunting.  Mild MR noted, as well as grade 3 atheroma plaque involving the transverse and descending aorta. Recommendation at time of discharge was for ASA and Plavix.  Continue to recommend ASA and Plavix, as well as aggressive risk factor modification with statin and LDL control.  Recommended increase to atorvastatin 80 mg daily for LDL control moving forward and given aortic atherosclerosis.  As noted at time of discharge, will defer to Dr. Rockey Situ and Dr. Caryl Comes discussion regarding decision for anticoagulation moving forward given prior ICD downloads indicating atrial tachycardia. EKG today showing likely underlying atrial tachycardia/fibrillation with recent remote transmission showing 15 AMS events.  Continue to hold off on NOAC at this time and as recommended at time of discharge and will schedule with Dr. Caryl Comes for follow-up and discussion of NOAC.  Orthostatic hypotension --BP soft today with patient dry on exam.  Recommend close monitoring of symptoms of orthostasis.  Will decrease to Lasix 20 mg every other day.  As previously noted, orthostatic hypotension has limited escalation of GDMT at previous clinic visits.  HLD, LDL goal below 70 --Increase to atorvastatin 80 mg daily today for aggressive LDL control given atheroma plaque involving the transverse and descending aorta on recent TEE.  If LDL remains above goal at repeat lipid and liver function in 6 to 8 weeks, recommend addition of Zetia or consideration of PCSK9 inhibitor at that time.  Will defer for now.  Repeat lipid and liver function in 6 to 8 weeks.  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  Recommend LDL goal below 70.  Continue ASA.  Severe  COPD --Continue home oxygen 3 to 4 L.  Dementia --Ongoing finding and per husband stable.  ROS/history provided by husband.  S/p ICD placement --Recommended that she have an appointment scheduled with Dr. Caryl Comes, and as recent telephone visit notes indicate that the office has reached out to make this appointment recently.  Reviewed most recent remote transmission with 15 AMS events, longest logged at 10 seconds.  Also indicated was that EGM suggest far field R wave sensing.  On EKG today, suspect underlying atrial fibrillation/atrial tachycardia with recent admission for stroke and recommendation at time of discharge that Dr. Rockey Situ speak with Dr. Caryl Comes regarding management/decision for anticoagulation moving forward.  At time of discharge, no anticoagulation was recommended.  Given Dr. Rockey Situ recommendation that he speak with Dr. Caryl Comes, will continue to defer anticoagulation with additional recommendations as per upcoming EP visit.  Medication changes: Increase to atorvastatin 80 mg daily Labs ordered: Repeat lipid and liver function in 6 to 8 weeks, BMET, TSH Studies / Imaging ordered: None Future considerations: Schedule EP visit for future recommendations regarding anticoagulation Disposition: RTC as scheduled with Dr. Caryl Comes  *Please be aware that the above documentation was completed voice recognition software and may contain dictation errors.       Arvil Chaco, PA-C 07/18/2020

## 2020-07-20 LAB — TSH

## 2020-07-20 LAB — BASIC METABOLIC PANEL

## 2020-07-24 ENCOUNTER — Telehealth: Payer: Self-pay | Admitting: *Deleted

## 2020-07-24 DIAGNOSIS — I5022 Chronic systolic (congestive) heart failure: Secondary | ICD-10-CM

## 2020-07-24 DIAGNOSIS — Z79899 Other long term (current) drug therapy: Secondary | ICD-10-CM

## 2020-07-24 DIAGNOSIS — I9589 Other hypotension: Secondary | ICD-10-CM

## 2020-07-24 NOTE — Telephone Encounter (Signed)
Left voicemail message to call back regarding lab results. BMET & TSH were not able to be done due to insufficient sample. Need to request her to have done over at the [facility] entrance.      Arvil Chaco, PA-C  07/21/2020  5:01 PM EDT Back to Top     For unclear reasons, all labs were not resulted and cancelled.  Lab error?

## 2020-07-25 ENCOUNTER — Ambulatory Visit (INDEPENDENT_AMBULATORY_CARE_PROVIDER_SITE_OTHER): Payer: Medicare Other

## 2020-07-25 DIAGNOSIS — Z95810 Presence of automatic (implantable) cardiac defibrillator: Secondary | ICD-10-CM

## 2020-07-25 DIAGNOSIS — I5022 Chronic systolic (congestive) heart failure: Secondary | ICD-10-CM

## 2020-07-25 NOTE — Telephone Encounter (Signed)
Left voicemail message to call back to review information.

## 2020-07-26 NOTE — Telephone Encounter (Signed)
Attempted to call the patient.  No answer- I left a message to please call back to discuss.

## 2020-07-26 NOTE — Telephone Encounter (Signed)
Attempted to call the patient/ Mr. Allum.  No answer- I did leave a detailed message advising that Mrs. Piccini's lab results were unable to be completed due to an insufficient specimen. I have asked if he could please bring her to the [facility] in the near future to have this done.  Orders have been placed.  I asked that he call back with any further questions/ concerns.

## 2020-07-26 NOTE — Telephone Encounter (Signed)
Patient spouse returning call  Ok to leave detailed VM

## 2020-07-26 NOTE — Progress Notes (Signed)
EPIC Encounter for ICM Monitoring  Patient Name: Stacy Grimes is a 70 y.o. female Date: 07/26/2020 Primary Care Physican: Cyndi Bender, PA-C Primary Cardiologist: Fletcher Anon Electrophysiologist: Vergie Living Pacing:  >99%    07/18/2020 Office Weight: 188 lbs   AT/AF Burden <1% (taking Aspirin & Plavix)   Spoke with husband per DPR and reports pt is doing okay.  He does not think she has any fluid symptoms.  Since 07/18/2020 OV, he has not given pt any Furosemide dosages.     Coruve thoracic impedance changed from possible dryness to possible fluid accumulation starting 07/21/2020.  Furosemide decreased to every other day at 07/17/2020 OV.   Prescribed: Furosemide 20 mg Take 1 tablet (20 mg total) by mouth every other day.  May take an additional tablet AS NEEDED for weight gain of 3 lbs overnight or 5 lbs in one week. Do not exceed more than 3 additional doses.      Labs: 06/21/2020 Creatinine 0.85, BUN 19, Potassium 4.0, Sodium 140 06/20/2020 Creatinine 0.89, BUN 20, Potassium 3.8, Sodium 141, GFR >60  A complete set of results can be found in Results Review.   Recommendations: Advised to take Furosemide 1 tablet x 2 days then start the prescribed dosage of every other day as instructed at 6/14 OV.   Follow-up plan: ICM clinic phone appointment on 08/03/2020 to recheck fluid levels  91 day device clinic remote transmission scheduled 10/05/2020.     EP/Cardiology Office Visits:    09/05/2020 with Dr Fletcher Anon.  09/19/2020 with Dr Suzzanne Cloud OV with Dr Caryl Comes was 07/16/2017)   Copy of ICM check sent to Dr. Caryl Comes and Marrianne Mood, PA as [name].     3 month ICM trend: 07/25/2020.    1 Year ICM trend:       [name], RN 07/26/2020 7:49 AM

## 2020-07-27 NOTE — Telephone Encounter (Signed)
Spoke with patients spouse per release form. Reviewed that labs needed to be repeated over at the [facility] entrance. He stated that someone already notified him and they will go to have those done. He was appreciative for the call with no further questions.

## 2020-07-31 NOTE — Progress Notes (Signed)
Remote ICD transmission.   

## 2020-08-03 ENCOUNTER — Ambulatory Visit (INDEPENDENT_AMBULATORY_CARE_PROVIDER_SITE_OTHER): Payer: Medicare Other

## 2020-08-03 DIAGNOSIS — I5022 Chronic systolic (congestive) heart failure: Secondary | ICD-10-CM

## 2020-08-03 DIAGNOSIS — Z95810 Presence of automatic (implantable) cardiac defibrillator: Secondary | ICD-10-CM

## 2020-08-04 NOTE — Progress Notes (Signed)
EPIC Encounter for ICM Monitoring  Patient Name: Stacy Grimes is a 70 y.o. female Date: 08/04/2020 Primary Care Physican: Cyndi Bender, PA-C Primary Cardiologist: Fletcher Anon Electrophysiologist: Vergie Living Pacing:  >99%    07/18/2020 Office Weight: 188 lbs   AT/AF Burden <1% (taking Aspirin & Plavix)   Spoke with husband.  Pt asymptomatic for fluid accumulation and feeing well.  He asked to have fluid levels checks in the next 2 weeks to make sure levels are stable.     Coruve thoracic impedance suggesting fluid levels returned to normal after taking Furosemide every other day.   Prescribed: Furosemide 20 mg Take 1 tablet (20 mg total) by mouth every other day.  May take an additional tablet AS NEEDED for weight gain of 3 lbs overnight or 5 lbs in one week. Do not exceed more than 3 additional doses.      Labs: 06/21/2020 Creatinine 0.85, BUN 19, Potassium 4.0, Sodium 140 06/20/2020 Creatinine 0.89, BUN 20, Potassium 3.8, Sodium 141, GFR >60  A complete set of results can be found in Results Review.   Recommendations: No changes and encouraged to call if experiencing any fluid symptoms.   Follow-up plan: ICM clinic phone appointment on 08/14/2020 to recheck fluid levels at husbands request.  91 day device clinic remote transmission scheduled 10/05/2020.     EP/Cardiology Office Visits:    09/05/2020 with Dr Fletcher Anon.  09/19/2020 with Dr Suzzanne Cloud OV with Dr Caryl Comes was 07/16/2017)   Copy of ICM check sent to Dr. Caryl Comes and Marrianne Mood, PA as [name].     3 month ICM trend: 08/03/2020.    1 Year ICM trend:       [name], RN 08/04/2020 7:57 AM

## 2020-08-14 ENCOUNTER — Ambulatory Visit (INDEPENDENT_AMBULATORY_CARE_PROVIDER_SITE_OTHER): Payer: Medicare Other

## 2020-08-14 DIAGNOSIS — I5022 Chronic systolic (congestive) heart failure: Secondary | ICD-10-CM

## 2020-08-14 DIAGNOSIS — Z95810 Presence of automatic (implantable) cardiac defibrillator: Secondary | ICD-10-CM

## 2020-08-18 ENCOUNTER — Ambulatory Visit (INDEPENDENT_AMBULATORY_CARE_PROVIDER_SITE_OTHER): Payer: Medicare Other

## 2020-08-18 ENCOUNTER — Other Ambulatory Visit: Payer: Self-pay | Admitting: *Deleted

## 2020-08-18 DIAGNOSIS — Z95810 Presence of automatic (implantable) cardiac defibrillator: Secondary | ICD-10-CM | POA: Diagnosis not present

## 2020-08-18 DIAGNOSIS — I5022 Chronic systolic (congestive) heart failure: Secondary | ICD-10-CM

## 2020-08-18 MED ORDER — CLOPIDOGREL BISULFATE 75 MG PO TABS: 75.0000 mg | ORAL_TABLET | Freq: Every day | ORAL | 0 refills | Status: DC

## 2020-08-18 NOTE — Telephone Encounter (Signed)
Per the hospitalist Dr. Ena Dawley documentation.  --5/20--pt underwent TEE. Per Dr Rockey Situ  Start pt on ASA +Plavix. EF ~20% , aortic calcification. could not see obvious LV thrombus.  Msg fwd to Dr. Fletcher Anon to advise

## 2020-08-18 NOTE — Telephone Encounter (Signed)
Patients husband calling in to request medication again. Patients husband  states she is out of medication. Husband made aware Dr.Arida has to respond and authorize this before anything can be sent in. Patients Husband is also asking if Marrianne Mood can advise   Please advise

## 2020-08-18 NOTE — Progress Notes (Signed)
EPIC Encounter for ICM Monitoring  Patient Name: Stacy Grimes is a 70 y.o. female Date: 08/18/2020 Primary Care Physican: Cyndi Bender, PA-C Primary Cardiologist: Fletcher Anon Electrophysiologist: Vergie Living Pacing:  99%    07/18/2020 Office Weight: 188 lbs   AT/AF Burden <1% (taking Aspirin & Plavix)   Spoke with husband and heart failure questions reviewed.  Pt asymptomatic for fluid accumulation and feeing well.   Coruve thoracic impedance suggesting normal fluid levels.   Prescribed:  Furosemide 20 mg Take 1 tablet (20 mg total) by mouth every other day.  May take an additional tablet AS NEEDED for weight gain of 3 lbs overnight or 5 lbs in one week. Do not exceed more than 3 additional doses.      Labs: 06/21/2020 Creatinine 0.85, BUN 19, Potassium 4.0, Sodium 140 06/20/2020 Creatinine 0.89, BUN 20, Potassium 3.8, Sodium 141, GFR >60  A complete set of results can be found in Results Review.   Recommendations: No changes and encouraged to call if experiencing any fluid symptoms.   Follow-up plan: ICM clinic phone appointment on 09/14/2020.  91 day device clinic remote transmission scheduled 10/05/2020.     EP/Cardiology Office Visits:    09/05/2020 with Dr Fletcher Anon.  09/19/2020 with Dr Suzzanne Cloud OV with Dr Caryl Comes was 07/16/2017)   Copy of ICM check sent to Dr. Caryl Comes.   3 month ICM trend: 08/14/2020.    1 Year ICM trend:       [name], RN 08/18/2020 8:25 AM

## 2020-08-18 NOTE — Progress Notes (Signed)
Opened in error

## 2020-08-18 NOTE — Telephone Encounter (Signed)
Discussed with Marrianne Mood, PA  Ok to refill Plavix 75 mg daily #30 R-0. Patient needs to keep her upcoming appt with Dr. Fletcher Anon to determine whether Plavix should be continued/managed by cards.  Patients husband made aware.

## 2020-09-05 ENCOUNTER — Ambulatory Visit: Payer: Medicare Other | Admitting: Cardiovascular Disease

## 2020-09-14 ENCOUNTER — Ambulatory Visit (INDEPENDENT_AMBULATORY_CARE_PROVIDER_SITE_OTHER): Payer: Medicare Other

## 2020-09-14 DIAGNOSIS — I5022 Chronic systolic (congestive) heart failure: Secondary | ICD-10-CM

## 2020-09-14 DIAGNOSIS — Z95810 Presence of automatic (implantable) cardiac defibrillator: Secondary | ICD-10-CM

## 2020-09-15 ENCOUNTER — Telehealth: Payer: Self-pay

## 2020-09-15 NOTE — Telephone Encounter (Signed)
Remote ICM transmission received.  Attempted call to patient regarding ICM remote transmission and left message to return call   

## 2020-09-15 NOTE — Progress Notes (Signed)
EPIC Encounter for ICM Monitoring  Patient Name: Stacy Grimes is a 70 y.o. female Date: 09/15/2020 Primary Care Physican: Cyndi Bender, PA-C Primary Cardiologist: Fletcher Anon Electrophysiologist: Vergie Living Pacing:  98%    07/18/2020 Office Weight: 188 lbs   AT/AF Burden <1% (taking Aspirin & Plavix)   Spoke with husband and heart failure questions reviewed.  Pt asymptomatic for fluid accumulation and feeling well.   Coruve thoracic impedance suggesting possible fluid accumulation starting 09/13/2020.  Impedance also decreased 7/30-8/4.   Prescribed:  Furosemide 20 mg Take 1 tablet (20 mg total) by mouth every other day.  May take an additional tablet AS NEEDED for weight gain of 3 lbs overnight or 5 lbs in one week. Do not exceed more than 3 additional doses.      Labs: 06/21/2020 Creatinine 0.85, BUN 19, Potassium 4.0, Sodium 140 06/20/2020 Creatinine 0.89, BUN 20, Potassium 3.8, Sodium 141, GFR >60  A complete set of results can be found in Results Review.   Recommendations: Advised to take Furosemide 20 mg x 3 consecutive days and then return to every other day.   Follow-up plan: ICM clinic phone appointment on 09/18/2020 (manual) 31 day and fluid recheck.  91 day device clinic remote transmission scheduled 10/05/2020.     EP/Cardiology Office Visits:    09/05/2020 with Dr Fletcher Anon and 09/19/2020 with Dr Caryl Comes (last OV with Dr Caryl Comes was 07/16/2017) as canceled and not rescheduled at this time.   Copy of ICM check sent to Dr. Caryl Comes.    3 month ICM trend: 09/14/2020.    1 Year ICM trend:       [name], RN 09/15/2020 10:42 AM

## 2020-09-17 ENCOUNTER — Other Ambulatory Visit: Payer: Self-pay | Admitting: Cardiovascular Disease

## 2020-09-18 ENCOUNTER — Ambulatory Visit (INDEPENDENT_AMBULATORY_CARE_PROVIDER_SITE_OTHER): Payer: Medicare Other

## 2020-09-18 DIAGNOSIS — I5022 Chronic systolic (congestive) heart failure: Secondary | ICD-10-CM | POA: Diagnosis not present

## 2020-09-18 DIAGNOSIS — Z95810 Presence of automatic (implantable) cardiac defibrillator: Secondary | ICD-10-CM | POA: Diagnosis not present

## 2020-09-19 ENCOUNTER — Encounter: Payer: Medicare Other | Admitting: Internal Medicine

## 2020-09-19 ENCOUNTER — Other Ambulatory Visit: Payer: Self-pay

## 2020-09-19 ENCOUNTER — Telehealth: Payer: Self-pay | Admitting: Physician Assistant

## 2020-09-19 MED ORDER — DIGOXIN 125 MCG PO TABS: ORAL_TABLET | ORAL | 0 refills | Status: DC

## 2020-09-19 NOTE — Telephone Encounter (Signed)
digoxin (LANOXIN) 0.125 MG tablet 36 tablet 0 09/19/2020    Sig: TAKE 1 TABLET BY MOUTH ON MONDAY, WEDNESDAY AND FRIDAY   Sent to pharmacy as: digoxin (LANOXIN) 0.125 MG tablet   E-Prescribing Status: Receipt confirmed by pharmacy (09/19/2020  2:10 PM EDT)    Pharmacy  [place], [place] - [id number]

## 2020-09-19 NOTE — Telephone Encounter (Signed)
Refill request for Plavix -- requested by patients husband ASAP as they are out of this medication.   Per last OV with JV --  "Recommendation at time of discharge was for ASA and Plavix.  Continue to recommend ASA and Plavix, as well as aggressive risk factor modification with statin and LDL control." "As noted at time of discharge, will defer to Dr. Rockey Situ and Dr. Caryl Comes discussion regarding decision for anticoagulation moving forward given prior ICD downloads indicating atrial tachycardia. EKG today showing likely underlying atrial tachycardia/fibrillation with recent remote transmission showing 15 AMS events.  Continue to hold off on NOAC at this time and as recommended at time of discharge and will schedule with Dr. Caryl Comes for follow-up and discussion of NOAC."  Patient does not see Dr. Caryl Comes until 11/28/2020

## 2020-09-19 NOTE — Progress Notes (Signed)
EPIC Encounter for ICM Monitoring  Patient Name: Stacy Grimes is a 70 y.o. female Date: 09/19/2020 Primary Care Physican: Cyndi Bender, PA-C Primary Cardiologist: Fletcher Anon Electrophysiologist: Vergie Living Pacing:  98%    07/18/2020 Office Weight: 188 lbs   AT/AF Burden <1% (taking Aspirin & Plavix)   Transmission reviewed.   Coruve thoracic impedance suggesting normal fluid levels.   Prescribed:  Furosemide 20 mg Take 1 tablet (20 mg total) by mouth every other day.  May take an additional tablet AS NEEDED for weight gain of 3 lbs overnight or 5 lbs in one week. Do not exceed more than 3 additional doses.      Labs: 06/21/2020 Creatinine 0.85, BUN 19, Potassium 4.0, Sodium 140 06/20/2020 Creatinine 0.89, BUN 20, Potassium 3.8, Sodium 141, GFR >60  A complete set of results can be found in Results Review.   Recommendations:  No changes   Follow-up plan: ICM clinic phone appointment on 10/23/2020.  91 day device clinic remote transmission scheduled 10/05/2020.     EP/Cardiology Office Visits:  11/28/2020 with Dr Caryl Comes.  Previous OV on 09/05/2020 with Dr Fletcher Anon and 09/19/2020 with Dr Caryl Comes (last OV with Dr Caryl Comes was 07/16/2017) were canceled.   Copy of ICM check sent to Dr. Caryl Comes.     3 month ICM trend: 09/16/2020.     [name], RN 09/19/2020 3:21 PM

## 2020-09-19 NOTE — Telephone Encounter (Signed)
*  STAT* If patient is at the pharmacy, call can be transferred to refill team.   1. Which medications need to be refilled? (please list name of each medication and dose if known)   Digoxin 0.125 mg po m w f  Clopidogrel 75 mg po q d   2. Which pharmacy/location (including street and city if local pharmacy) is medication to be sent to? [place]   3. Do they need a 30 day or 90 day supply? 90    Patient has been out of plavix and husband asking for asap

## 2020-09-20 ENCOUNTER — Other Ambulatory Visit: Payer: Self-pay

## 2020-09-20 MED ORDER — CLOPIDOGREL BISULFATE 75 MG PO TABS: 75.0000 mg | ORAL_TABLET | Freq: Every day | ORAL | 0 refills | Status: DC

## 2020-09-20 NOTE — Telephone Encounter (Signed)
Refill sent to the pharmacy on 09/19/20 at 2:10 pm.  # 36 tablets w/ no refills- this was fine, but she will need to keep her OV for further refills as she has not had a digoxin level in quite some time and we will need to check this.  The patient/ family cancelled the appointment with Dr. Caryl Comes on 09/19/20 due to a scheduling conflict.   Thanks!

## 2020-09-22 ENCOUNTER — Telehealth: Payer: Self-pay

## 2020-09-22 NOTE — Telephone Encounter (Signed)
Return call to spouse per voice mail request.  Spouse inquired if patient can take Diclofenac for back pain.  She is not prescribed the medication but he as some he takes for occasional pain.  Advised she would need a prescription for the medication and it is typically not recommended she take NSAID's. Advised she can take OTC tylenol for pain but to contact PCP if she needs something stronger for pain.  He appreciated the call back.

## 2020-10-05 ENCOUNTER — Ambulatory Visit (INDEPENDENT_AMBULATORY_CARE_PROVIDER_SITE_OTHER): Payer: Medicare Other

## 2020-10-05 DIAGNOSIS — I428 Other cardiomyopathies: Secondary | ICD-10-CM

## 2020-10-10 LAB — CUP PACEART REMOTE DEVICE CHECK
Battery Remaining Longevity: 32 mo
Battery Remaining Percentage: 41 %
Battery Voltage: 2.92 V
Brady Statistic AP VP Percent: 1 %
Brady Statistic AP VS Percent: 1 %
Brady Statistic AS VP Percent: 99 %
Brady Statistic AS VS Percent: 1.3 %
Brady Statistic RA Percent Paced: 1 %
Date Time Interrogation Session: 20220901020016
HighPow Impedance: 56 Ohm
HighPow Impedance: 56 Ohm
Implantable Lead Implant Date: 20110309
Implantable Lead Implant Date: 20110309
Implantable Lead Implant Date: 20110309
Implantable Lead Location: 753858
Implantable Lead Location: 753859
Implantable Lead Location: 753860
Implantable Lead Model: 7121
Implantable Pulse Generator Implant Date: 20180814
Lead Channel Impedance Value: 360 Ohm
Lead Channel Impedance Value: 510 Ohm
Lead Channel Impedance Value: 610 Ohm
Lead Channel Pacing Threshold Amplitude: 0.75 V
Lead Channel Pacing Threshold Amplitude: 0.75 V
Lead Channel Pacing Threshold Amplitude: 0.875 V
Lead Channel Pacing Threshold Pulse Width: 0.5 ms
Lead Channel Pacing Threshold Pulse Width: 0.5 ms
Lead Channel Pacing Threshold Pulse Width: 0.5 ms
Lead Channel Sensing Intrinsic Amplitude: 11.7 mV
Lead Channel Sensing Intrinsic Amplitude: 5 mV
Lead Channel Setting Pacing Amplitude: 2 V
Lead Channel Setting Pacing Amplitude: 2.25 V
Lead Channel Setting Pacing Amplitude: 2.5 V
Lead Channel Setting Pacing Pulse Width: 0.5 ms
Lead Channel Setting Pacing Pulse Width: 0.5 ms
Lead Channel Setting Sensing Sensitivity: 0.5 mV
Pulse Gen Serial Number: 9761609

## 2020-10-11 NOTE — Progress Notes (Signed)
Cardiology Office Note:    Date:  10/12/2020   ID:  Stacy Grimes, DOB 03-May-1950, MRN [id number]  PCP:  Cyndi Bender, PA-C  [facility] Cardiologist:  Kathlyn Sacramento, MD  [facility] Electrophysiologist:  Virl Axe, MD   Referring MD: Cyndi Bender, PA-C   Chief Complaint: 3 month f/u  History of Present Illness:    Stacy Grimes is a 70 y.o. female with a hx of orthostatic hypotension/dizziness, NICM with ICD (Dr. Caryl Comes, [place] with change-out 2018), COPD chronically on O2, chronic HFrEF, HTN, AAA, hypothyroidism, dementia /memory loss, previous smoker with 60 pk yr history, and sleep apnea  who is being seen for 3 month follow-up.   Stacy Grimes is followed by Dr. Fletcher Anon and Dr. Caryl Comes. H/o of cath in 2010 showing EF 15-20% with no obstructive disease. She had improvement of EF to the low normal range by echo November 2021 with EF 35-40%. Hospitalized 2020 for syncope and was noted to be hypotensive. Meds were held. Felt to be due to dehydration. Started back on diuretic for volume management. No ACE/ARB restarted for hypotension and acute renal failure. On metoprolol and digoxin three times a week.   Admitted to [facility] with transthoracic echocardiogram performed for stroke.  She is seen today for follow-up of this transthoracic echocardiogram.  TEE showed EF 20-25% and GH, no thrombus or shunting, mild MR. At the time of her admission, it was recommended that she follow-up with EP for possible atrial fibrillation not on anticoagulation.  At the time of discharge, anticoagulation was not started due to lack of clear evidence of atrial fibrillation with recommendation that this be discussed with Dr. Caryl Comes by the rounding cardiologist progress notes.  Last seen 07/18/20 for TEE follow-up. BP was low and she was noted ot be dry on recent [place] impedance monitoring with recommendation to decrease lasix. Lasix was decreased to lasix 20mg  every other day.   Today, husband reports  patient has been going through a roller coaster. Sometimes with fever and chills. She is taking aspirin and plavix for recent stroke. No chest pain. Some times has shortness of breath. No Has some nausea form medications. She has been taking lasix every other day.  Still on 3-4L O2. No LLE, orthopnea, pnd. Will take extra lasix as needed depending on device check. BP today good.   Past Medical History:  Diagnosis Date   AAA (abdominal aortic aneurysm) (HCC)    Anginal pain ([place])    Anxiety    Arthritis    Automatic implantable cardioverter-defibrillator in situ    Biventricular ICD  [place]    COPD (chronic obstructive pulmonary disease) (HCC)    Deafness    left ear   Dementia (HCC)    Depression    Dizziness    Dysrhythmia    Fracture    history of spinal fracture   GERD (gastroesophageal reflux disease)    HFrEF (heart failure with reduced ejection fraction) ([place])    a. 2010 Cath: nonobs dzs, EF 15-20%; b. 09/2017 Echo: EF suboptimal. EF low nl; c. 04/2018 Echo: EF 40-45%.   Hypertension    Hypothyroidism    Non-ischemic cardiomyopathy ([place])    a. 2010 Cath: nonobs dzs, EF 15-20%; b. 2011 s/p SJM CRT-D; c. 09/2016 s/p Gen change; d. 09/2017 Echo: EF suboptimal. EF low nl; e. 04/2018 Echo: EF 40-45%.   Oxygen dependent    Palpitations    Shortness of breath dyspnea    Sleep apnea  Wheezing     Past Surgical History:  Procedure Laterality Date   ABDOMINAL HYSTERECTOMY     BIV ICD GENERATOR CHANGEOUT N/A 09/17/2016   Procedure: BiV ICD Generator Changeout;  Surgeon: Deboraha Sprang, MD;  Location: [facility];  Service: Cardiovascular;  Laterality: N/A;   BLADDER SURGERY     CARDIAC CATHETERIZATION     CARDIAC DEFIBRILLATOR PLACEMENT  4 yrs ago   pacemaker   CATARACT EXTRACTION W/PHACO Left 09/01/2014   Procedure: CATARACT EXTRACTION PHACO AND INTRAOCULAR LENS PLACEMENT ([place]);  Surgeon: Lyla Glassing, MD;  Location: [facility];  Service: Ophthalmology;  Laterality: Left;   [place]: 1:12.1    CATARACT EXTRACTION W/PHACO Right 09/29/2014   Procedure: CATARACT EXTRACTION PHACO AND INTRAOCULAR LENS PLACEMENT (IOC);  Surgeon: Lyla Glassing, MD;  Location: [facility];  Service: Ophthalmology;  Laterality: Right;  [place]: 00:52.7    CHOLECYSTECTOMY     COLON SURGERY     COLONOSCOPY     COLONOSCOPY N/A 08/31/2012   Procedure: COLONOSCOPY;  Surgeon: Inda Castle, MD;  Location: [facility];  Service: Endoscopy;  Laterality: N/A;   ESOPHAGOGASTRODUODENOSCOPY N/A 07/06/2012   Procedure: ESOPHAGOGASTRODUODENOSCOPY (EGD);  Surgeon: Lafayette Dragon, MD;  Location: [facility];  Service: Endoscopy;  Laterality: N/A;   KNEE SURGERY Right    MIDDLE EAR SURGERY     SAVORY DILATION N/A 07/06/2012   Procedure: SAVORY DILATION;  Surgeon: Lafayette Dragon, MD;  Location: [facility];  Service: Endoscopy;  Laterality: N/A;   TEE WITHOUT CARDIOVERSION N/A 06/23/2020   Procedure: TRANSESOPHAGEAL ECHOCARDIOGRAM (TEE);  Surgeon: Minna Merritts, MD;  Location: [facility];  Service: Cardiovascular;  Laterality: N/A;   WRIST SURGERY Right     Current Medications: Current Meds  Medication Sig   albuterol (ACCUNEB) 0.63 MG/3ML nebulizer solution Take 1 ampule by nebulization every 6 (six) hours as needed for wheezing.   albuterol (PROVENTIL HFA;VENTOLIN HFA) 108 (90 Base) MCG/ACT inhaler Inhale 2 puffs into the lungs every 6 (six) hours as needed.   allopurinol (ZYLOPRIM) 300 MG tablet Take 300 mg by mouth 2 (two) times daily.   aspirin 81 MG EC tablet Take 81 mg by mouth daily.   atorvastatin (LIPITOR) 80 MG tablet Take 1 tablet (80 mg total) by mouth daily.   budesonide-formoterol (SYMBICORT) 160-4.5 MCG/ACT inhaler Inhale 2 puffs into the lungs 2 (two) times daily.   clopidogrel (PLAVIX) 75 MG tablet Take 1 tablet (75 mg total) by mouth daily. Future refills to be authorized at upcoming office visit.   digoxin (LANOXIN) 0.125 MG tablet TAKE 1 TABLET BY MOUTH ON MONDAY, WEDNESDAY AND FRIDAY    donepezil (ARICEPT) 5 MG tablet Take 5 mg by mouth at bedtime.   furosemide (LASIX) 20 MG tablet Take 1 tablet (20 mg total) by mouth every other day. May take an additional tablet AS NEEDED for weight gain of 3 lbs overnight or 5 lbs in one week. Do not exceed more than 3 additional doses.   gabapentin (NEURONTIN) 300 MG capsule Take 300 mg by mouth 2 (two) times daily as needed (RLS symptoms/ pain).    hydrOXYzine (VISTARIL) 25 MG capsule Take 25 mg by mouth at bedtime as needed for anxiety.   levothyroxine (SYNTHROID) 112 MCG tablet Take 112 mcg by mouth daily before breakfast.   losartan (COZAAR) 25 MG tablet Take 0.5 tablets (12.5 mg total) by mouth daily.   Menthol, Topical Analgesic, (BENGAY EX) Apply 1 application topically daily as needed (back pain).  metFORMIN (GLUCOPHAGE-XR) 500 MG 24 hr tablet Take 500 mg by mouth 2 (two) times daily with a meal.    metoprolol succinate (TOPROL-XL) 25 MG 24 hr tablet Take 25 mg by mouth daily.   omeprazole (PRILOSEC) 20 MG capsule Take 1 capsule (20 mg total) by mouth every morning.   Sennosides (EX-LAX PO) Take 2 tablets by mouth at bedtime as needed (constipation).   tiotropium (SPIRIVA) 18 MCG inhalation capsule Place 1 capsule (18 mcg total) into inhaler and inhale daily as needed (shortness of breath).   venlafaxine (EFFEXOR) 75 MG tablet Take 75 mg by mouth 2 (two) times daily.   vitamin B-12 (CYANOCOBALAMIN) 1000 MCG tablet Take 1,000 mcg by mouth 2 (two) times daily.      Allergies:   Codeine and Contrast media [iodinated diagnostic agents]   Social History   Socioeconomic History   Marital status: Married    Spouse name: Not on file   Number of children: 3   Years of education: Not on file   Highest education level: Not on file  Occupational History   Occupation: Retired    Fish farm manager: DISABLED  Tobacco Use   Smoking status: Former    Packs/day: 3.00    Years: 20.00    Pack years: 60.00    Types: Cigarettes    Quit date:  02/04/2002    Years since quitting: 18.6   Smokeless tobacco: Never  Vaping Use   Vaping Use: Never used  Substance and Sexual Activity   Alcohol use: No   Drug use: No   Sexual activity: Not on file  Other Topics Concern   Not on file  Social History Narrative   Not on file   Social Determinants of Health   Financial Resource Strain: Not on file  Food Insecurity: Not on file  Transportation Needs: Not on file  Physical Activity: Not on file  Stress: Not on file  Social Connections: Not on file     Family History: The patient's family history includes Breast cancer in her mother; Colon cancer (age of onset: 12) in her brother; Heart attack in her sister and sister; Heart disease in her father; Hypertension in her brother and mother; Stroke in her mother.  ROS:   Please see the history of present illness.     All other systems reviewed and are negative.  EKGs/Labs/Other Studies Reviewed:    The following studies were reviewed today:  Echo TEE 06/23/20 PROCEDURE: After discussion of the risks and benefits of a TEE, an  informed consent was obtained from the patient. The transesophogeal probe  was passed without difficulty through the esophogus of the patient. Local  oropharyngeal anesthetic was provided  with Benzocaine spray and Cetacaine. Sedation performed by performing  physician. Image quality was excellent. The patient's vital signs;  including heart rate, blood pressure, and oxygen saturation; remained  stable throughout the procedure. The patient  developed no complications during the procedure.   IMPRESSIONS     1. Left ventricular ejection fraction, by estimation, is 20 to 25%. The  left ventricle has severely decreased function. The left ventricle  demonstrates global hypokinesis. The left ventricular internal cavity size  was moderately dilated.   2. Right ventricular systolic function is moderately reduced. The right  ventricular size is normal.   3.  Left atrial size was mildly dilated. No left atrial/left atrial  appendage thrombus was detected.   4. The mitral valve is normal in structure. Mild mitral valve  regurgitation.  5. Agitated saline contrast bubble study was negative, with no evidence  of any interatrial shunt.   6. There is Moderate (Grade III) atheroma plaque involving the transverse  and descending aorta.   Conclusion(s)/Recommendation(s): Normal biventricular function without  evidence of hemodynamically significant valvular heart disease.    Echo 06/2020 1. Challenging images   2. Left ventricular ejection fraction, by estimation, is <20%. The left  ventricle has severely decreased function. The left ventricle has no  regional wall motion abnormalities. The left ventricular internal cavity  size was moderately dilated. Left  ventricular diastolic parameters are indeterminate.   3. Right ventricular systolic function is normal. The right ventricular  size is normal.   EKG:  EKG is not ordered today.    Recent Labs: 12/15/2019: B Natriuretic Peptide 82.4 06/20/2020: ALT 10 06/21/2020: Hemoglobin 10.9; Magnesium 2.0; Platelets 337 07/18/2020: BUN CANCELED; Creatinine, Ser CANCELED; Potassium CANCELED; Sodium CANCELED; TSH CANCELED  Recent Lipid Panel    Component Value Date/Time   CHOL 221 (H) 06/21/2020 0408   TRIG 109 06/21/2020 0408   HDL 60 06/21/2020 0408   CHOLHDL 3.7 06/21/2020 0408   VLDL 22 06/21/2020 0408   LDLCALC 139 (H) 06/21/2020 0408    Physical Exam:    VS:  BP 135/67 (BP Location: Left Arm, Patient Position: Sitting, Cuff Size: Normal)   Pulse 86   Ht 5\' 3"  (1.6 m)   Wt 184 lb (83.5 kg)   SpO2 91% Comment: on 3L O2  BMI 32.59 kg/m     Wt Readings from Last 3 Encounters:  10/12/20 184 lb (83.5 kg)  07/18/20 188 lb (85.3 kg)  06/23/20 198 lb 13.7 oz (90.2 kg)     GEN:  Well nourished, well developed in no acute distress HEENT: Normal NECK: No JVD; No carotid bruits LYMPHATICS:  No lymphadenopathy CARDIAC: RRR, no murmurs, rubs, gallops RESPIRATORY:  diffusely diminished 3LO2 ABDOMEN: Soft, non-tender, non-distended MUSCULOSKELETAL:  No edema; No deformity  SKIN: Warm and dry NEUROLOGIC:  Alert and oriented x 3 PSYCHIATRIC:  Normal affect   ASSESSMENT:    1. Chronic systolic congestive heart failure (HCC)   2. Screening, lipid   3. History of stroke   4. Orthostatic hypotension   5. Chronic obstructive pulmonary disease, unspecified COPD type ([place])   6. NICM (nonischemic cardiomyopathy) ([place])   7. Hyperlipidemia, mixed    PLAN:    In order of problems listed above:  NICM Chronic systolic heart failure TEE 06/2020 showed EF 20-25%. Prior echo 12/2019 showed EF 35-40%. Lasix decreased last time to 20mg  every other day. She appears euvolemic on exam. H/o of hypotension and orthostasis, so did not tolerate Entresto in the past. BP today is good. Will trial Losartan 12.5mg  daily. BMET in a week. Continue digoxin and Toprol.   Prior stroke  Treated with tPA. No acute stroke on CT. TEE showed EF 20-25% with no thrombus or shunting and global HK. Continue Aspirin, plavix, and statin.   HLD Atorvastatin was increased to 80mg  daily at the last visit for LDL 139. Will recheck lipid profile/LFTs today.   Severe COPD She is on her baseline 2-3L O2.  S/p ICD placement Most recent ICD interrogation showed <1% Afib/Atach. Will discuss anticoagulation with Dr. Caryl Comes.    Disposition: Follow up in 3 month(s) with MD/APP     Signed, Kemi Gell Ninfa Meeker, PA-C  10/12/2020 7:50 PM     [facility]

## 2020-10-12 ENCOUNTER — Other Ambulatory Visit: Payer: Self-pay

## 2020-10-12 ENCOUNTER — Encounter: Payer: Self-pay | Admitting: Medical

## 2020-10-12 ENCOUNTER — Ambulatory Visit (INDEPENDENT_AMBULATORY_CARE_PROVIDER_SITE_OTHER): Payer: Medicare Other | Admitting: Medical

## 2020-10-12 VITALS — BP 135/67 | HR 86 | Ht 63.0 in | Wt 184.0 lb

## 2020-10-12 DIAGNOSIS — I428 Other cardiomyopathies: Secondary | ICD-10-CM | POA: Diagnosis not present

## 2020-10-12 DIAGNOSIS — I951 Orthostatic hypotension: Secondary | ICD-10-CM | POA: Diagnosis not present

## 2020-10-12 DIAGNOSIS — I5022 Chronic systolic (congestive) heart failure: Secondary | ICD-10-CM

## 2020-10-12 DIAGNOSIS — Z8673 Personal history of transient ischemic attack (TIA), and cerebral infarction without residual deficits: Secondary | ICD-10-CM

## 2020-10-12 DIAGNOSIS — I639 Cerebral infarction, unspecified: Secondary | ICD-10-CM

## 2020-10-12 DIAGNOSIS — E782 Mixed hyperlipidemia: Secondary | ICD-10-CM | POA: Diagnosis not present

## 2020-10-12 DIAGNOSIS — J449 Chronic obstructive pulmonary disease, unspecified: Secondary | ICD-10-CM | POA: Diagnosis not present

## 2020-10-12 DIAGNOSIS — Z13220 Encounter for screening for lipoid disorders: Secondary | ICD-10-CM | POA: Diagnosis not present

## 2020-10-12 MED ORDER — LOSARTAN POTASSIUM 25 MG PO TABS: 12.5000 mg | ORAL_TABLET | Freq: Every day | ORAL | 3 refills | Status: DC

## 2020-10-12 NOTE — Patient Instructions (Signed)
Medication Instructions:   Your physician has recommended you make the following change in your medication:   START taking Losartan 12.5 MG once a day.    *If you need a refill on your cardiac medications before your next appointment, please call your pharmacy*   Lab Work:  Lipid, LFT drawn today.  Please return to our office in 1 week for a lab (BMP) draw on _________at_________am/pm   Testing/Procedures: None ordered   Follow-Up: At [facility], you and your health needs are our priority.  As part of our continuing mission to provide you with exceptional heart care, we have created designated Provider Care Teams.  These Care Teams include your primary Cardiologist (physician) and Advanced Practice Providers (APPs -  Physician Assistants and Nurse Practitioners) who all work together to provide you with the care you need, when you need it.  We recommend signing up for the patient portal called "MyChart".  Sign up information is provided on this After Visit Summary.  MyChart is used to connect with patients for Virtual Visits (Telemedicine).  Patients are able to view lab/test results, encounter notes, upcoming appointments, etc.  Non-urgent messages can be sent to your provider as well.   To learn more about what you can do with MyChart, go to [email or web address].    Your next appointment:   3 month(s)  The format for your next appointment:   In Person  Provider:   You may see Kathlyn Sacramento, MD or one of the following Advanced Practice Providers on your designated Care Team:   Murray Hodgkins, NP Christell Faith, PA-C Marrianne Mood, PA-C Cadence Kathlen Mody, [place]   Other Instructions

## 2020-10-13 LAB — HEPATIC FUNCTION PANEL
ALT: 7 IU/L (ref 0–32)
AST: 14 IU/L (ref 0–40)
Albumin: 3.9 g/dL (ref 3.7–4.7)
Alkaline Phosphatase: 118 IU/L (ref 44–121)
Bilirubin Total: 0.2 mg/dL (ref 0.0–1.2)
Bilirubin, Direct: <0.1 mg/dL (ref 0.00–0.40)
Total Protein: 7.6 g/dL (ref 6.0–8.5)

## 2020-10-13 LAB — LIPID PANEL
Chol/HDL Ratio: 2.9 ratio (ref 0.0–4.4)
Cholesterol, Total: 152 mg/dL (ref 100–199)
HDL: 53 mg/dL (ref >39)
LDL Chol Calc (NIH): 65 mg/dL (ref 0–99)
Triglycerides: 211 mg/dL — ABNORMAL HIGH (ref 0–149)
VLDL Cholesterol Cal: 34 mg/dL (ref 5–40)

## 2020-10-16 ENCOUNTER — Other Ambulatory Visit: Payer: Self-pay | Admitting: *Deleted

## 2020-10-16 MED ORDER — CLOPIDOGREL BISULFATE 75 MG PO TABS: 75.0000 mg | ORAL_TABLET | Freq: Every day | ORAL | 3 refills | Status: DC

## 2020-10-17 NOTE — Progress Notes (Signed)
Remote ICD transmission.   

## 2020-10-19 ENCOUNTER — Other Ambulatory Visit: Payer: Medicare Other

## 2020-10-23 ENCOUNTER — Ambulatory Visit (INDEPENDENT_AMBULATORY_CARE_PROVIDER_SITE_OTHER): Payer: Medicare Other

## 2020-10-23 DIAGNOSIS — Z95810 Presence of automatic (implantable) cardiac defibrillator: Secondary | ICD-10-CM | POA: Diagnosis not present

## 2020-10-23 DIAGNOSIS — I5022 Chronic systolic (congestive) heart failure: Secondary | ICD-10-CM | POA: Diagnosis not present

## 2020-10-24 NOTE — Progress Notes (Signed)
EPIC Encounter for ICM Monitoring  Patient Name: Stacy Grimes is a 70 y.o. female Date: 10/24/2020 Primary Care Physican: Cyndi Bender, PA-C Primary Cardiologist: Fletcher Anon Electrophysiologist: Vergie Living Pacing:  98%    07/18/2020 Office Weight: 188 lbs   AT/AF Burden <1% (taking Aspirin & Plavix)   Spoke with husband and heart failure questions reviewed.  Pt asymptomatic for fluid accumulation and feeling well.   Coruve thoracic impedance suggesting normal fluid levels.   Prescribed:  Furosemide 20 mg Take 1 tablet (20 mg total) by mouth every other day.  May take an additional tablet AS NEEDED for weight gain of 3 lbs overnight or 5 lbs in one week. Do not exceed more than 3 additional doses.      Labs: 06/21/2020 Creatinine 0.85, BUN 19, Potassium 4.0, Sodium 140 06/20/2020 Creatinine 0.89, BUN 20, Potassium 3.8, Sodium 141, GFR >60  A complete set of results can be found in Results Review.   Recommendations:  No changes and encouraged to call if pt is experiencing any fluid symptoms.   Follow-up plan: ICM clinic phone appointment on 11/27/2020.  91 day device clinic remote transmission scheduled 01/04/2021.     EP/Cardiology Office Visits:  11/28/2020 with Dr Caryl Comes.     Copy of ICM check sent to Dr. Caryl Comes.      3 month ICM trend: 10/23/2020.    1 Year ICM trend:       [name], RN 10/24/2020 9:37 AM

## 2020-11-15 ENCOUNTER — Telehealth: Payer: Self-pay

## 2020-11-15 NOTE — Telephone Encounter (Signed)
Returned call to husband per DPR as per voice mail request.  He asked if Dr Caryl Comes is in for [employer].  He said he is changing patients insurance from [facility] [employer] to [facility] for the upcoming 2023.  Advised asked Lucy Chris at the billing office she confirmed Dr Caryl Comes is in [facility]. He appreciated the call.

## 2020-11-27 ENCOUNTER — Ambulatory Visit (INDEPENDENT_AMBULATORY_CARE_PROVIDER_SITE_OTHER): Payer: Medicare Other

## 2020-11-27 DIAGNOSIS — I5022 Chronic systolic (congestive) heart failure: Secondary | ICD-10-CM | POA: Diagnosis not present

## 2020-11-27 DIAGNOSIS — Z95810 Presence of automatic (implantable) cardiac defibrillator: Secondary | ICD-10-CM | POA: Diagnosis not present

## 2020-11-28 ENCOUNTER — Encounter: Payer: Medicare Other | Admitting: Internal Medicine

## 2020-11-29 NOTE — Progress Notes (Signed)
EPIC Encounter for ICM Monitoring  Patient Name: Stacy Grimes is a 70 y.o. female Date: 11/29/2020 Primary Care Physican: Cyndi Bender, PA-C Primary Cardiologist: Fletcher Anon Electrophysiologist: Vergie Living Pacing:  98%    10/12/2020 Office Weight: 184 lbs   AT/AF Burden <1% (taking Aspirin & Plavix)   Spoke with husband and heart failure questions reviewed.  Pt asymptomatic for fluid accumulation and feeling well.   Coruve thoracic impedance suggesting normal fluid levels.   Prescribed:  Furosemide 20 mg Take 1 tablet (20 mg total) by mouth every other day.  May take an additional tablet AS NEEDED for weight gain of 3 lbs overnight or 5 lbs in one week. Do not exceed more than 3 additional doses.      Labs: 06/21/2020 Creatinine 0.85, BUN 19, Potassium 4.0, Sodium 140 06/20/2020 Creatinine 0.89, BUN 20, Potassium 3.8, Sodium 141, GFR >60  A complete set of results can be found in Results Review.   Recommendations:  No changes and encouraged to call if experiencing any fluid symptoms.   Follow-up plan: ICM clinic phone appointment on 01/08/2021.  91 day device clinic remote transmission scheduled 01/04/2021.     EP/Cardiology Office Visits:  01/24/2021 with Dr Fletcher Anon.    Missed appt on 11/28/2020 with Dr Caryl Comes.     Copy of ICM check sent to Dr. Caryl Comes.     3 month ICM trend: 11/27/2020.    1 Year ICM trend:       [name], RN 11/29/2020 8:26 AM

## 2020-12-12 ENCOUNTER — Other Ambulatory Visit: Payer: Self-pay | Admitting: Cardiovascular Disease

## 2021-01-04 ENCOUNTER — Ambulatory Visit (INDEPENDENT_AMBULATORY_CARE_PROVIDER_SITE_OTHER): Payer: Medicare Other

## 2021-01-04 DIAGNOSIS — I5022 Chronic systolic (congestive) heart failure: Secondary | ICD-10-CM | POA: Diagnosis not present

## 2021-01-04 LAB — CUP PACEART REMOTE DEVICE CHECK
Battery Remaining Longevity: 30 mo
Battery Remaining Percentage: 39 %
Battery Voltage: 2.92 V
Brady Statistic AP VP Percent: 1 %
Brady Statistic AP VS Percent: 1 %
Brady Statistic AS VP Percent: 98 %
Brady Statistic AS VS Percent: 2.2 %
Brady Statistic RA Percent Paced: 1 %
Date Time Interrogation Session: 20221201020018
HighPow Impedance: 55 Ohm
HighPow Impedance: 55 Ohm
Implantable Lead Implant Date: 20110309
Implantable Lead Implant Date: 20110309
Implantable Lead Implant Date: 20110309
Implantable Lead Location: 753858
Implantable Lead Location: 753859
Implantable Lead Location: 753860
Implantable Lead Model: 7121
Implantable Pulse Generator Implant Date: 20180814
Lead Channel Impedance Value: 350 Ohm
Lead Channel Impedance Value: 510 Ohm
Lead Channel Impedance Value: 540 Ohm
Lead Channel Pacing Threshold Amplitude: 0.75 V
Lead Channel Pacing Threshold Amplitude: 0.75 V
Lead Channel Pacing Threshold Amplitude: 0.875 V
Lead Channel Pacing Threshold Pulse Width: 0.5 ms
Lead Channel Pacing Threshold Pulse Width: 0.5 ms
Lead Channel Pacing Threshold Pulse Width: 0.5 ms
Lead Channel Sensing Intrinsic Amplitude: 11.7 mV
Lead Channel Sensing Intrinsic Amplitude: 4.7 mV
Lead Channel Setting Pacing Amplitude: 2 V
Lead Channel Setting Pacing Amplitude: 2.25 V
Lead Channel Setting Pacing Amplitude: 2.5 V
Lead Channel Setting Pacing Pulse Width: 0.5 ms
Lead Channel Setting Pacing Pulse Width: 0.5 ms
Lead Channel Setting Sensing Sensitivity: 0.5 mV
Pulse Gen Serial Number: 9761609

## 2021-01-08 ENCOUNTER — Ambulatory Visit (INDEPENDENT_AMBULATORY_CARE_PROVIDER_SITE_OTHER): Payer: Medicare Other

## 2021-01-08 DIAGNOSIS — Z95810 Presence of automatic (implantable) cardiac defibrillator: Secondary | ICD-10-CM | POA: Diagnosis not present

## 2021-01-08 DIAGNOSIS — I5022 Chronic systolic (congestive) heart failure: Secondary | ICD-10-CM | POA: Diagnosis not present

## 2021-01-09 NOTE — Progress Notes (Signed)
EPIC Encounter for ICM Monitoring  Patient Name: Stacy Grimes is a 70 y.o. female Date: 01/09/2021 Primary Care Physican: Cyndi Bender, PA-C Primary Cardiologist: Fletcher Anon Electrophysiologist: Vergie Living Pacing:  97%    10/12/2020 Office Weight: 184 lbs   AT/AF Burden <1% (taking Aspirin & Plavix)   Spoke with husband and heart failure questions reviewed.  Pt asymptomatic for fluid accumulation.   Coruve thoracic impedance suggesting possible fluid accumulation starting 01/03/2021 but trending back toward baseline.   Prescribed:  Furosemide 20 mg Take 1 tablet (20 mg total) by mouth every other day.  May take an additional tablet AS NEEDED for weight gain of 3 lbs overnight or 5 lbs in one week. Do not exceed more than 3 additional doses.      Labs: 06/21/2020 Creatinine 0.85, BUN 19, Potassium 4.0, Sodium 140 06/20/2020 Creatinine 0.89, BUN 20, Potassium 3.8, Sodium 141, GFR >60  A complete set of results can be found in Results Review.   Recommendations:  Advised to take additional Furosemide tablet x 2 days then return to prescribed dosage.   Follow-up plan: ICM clinic phone appointment on 01/17/2021 to recheck fluid levels.  91 day device clinic remote transmission scheduled 04/05/2021.     EP/Cardiology Office Visits:  01/24/2021 with Dr Fletcher Anon.    Missed appt on 11/28/2020 with Dr Caryl Comes.     Copy of ICM check sent to Dr. Caryl Comes and Dr Fletcher Anon as Stacy Grimes.   3 month ICM trend: 01/08/2021.    12-14 Month ICM trend:       [name], RN 01/09/2021 7:54 AM

## 2021-01-12 DIAGNOSIS — Z79899 Other long term (current) drug therapy: Secondary | ICD-10-CM | POA: Diagnosis not present

## 2021-01-12 DIAGNOSIS — M109 Gout, unspecified: Secondary | ICD-10-CM | POA: Diagnosis not present

## 2021-01-12 DIAGNOSIS — E039 Hypothyroidism, unspecified: Secondary | ICD-10-CM | POA: Diagnosis not present

## 2021-01-12 DIAGNOSIS — R413 Other amnesia: Secondary | ICD-10-CM | POA: Diagnosis not present

## 2021-01-12 DIAGNOSIS — J449 Chronic obstructive pulmonary disease, unspecified: Secondary | ICD-10-CM | POA: Diagnosis not present

## 2021-01-12 DIAGNOSIS — G8929 Other chronic pain: Secondary | ICD-10-CM | POA: Diagnosis not present

## 2021-01-12 DIAGNOSIS — M545 Low back pain, unspecified: Secondary | ICD-10-CM | POA: Diagnosis not present

## 2021-01-12 DIAGNOSIS — Z139 Encounter for screening, unspecified: Secondary | ICD-10-CM | POA: Diagnosis not present

## 2021-01-12 DIAGNOSIS — Z9181 History of falling: Secondary | ICD-10-CM | POA: Diagnosis not present

## 2021-01-12 DIAGNOSIS — F418 Other specified anxiety disorders: Secondary | ICD-10-CM | POA: Diagnosis not present

## 2021-01-12 DIAGNOSIS — J9610 Chronic respiratory failure, unspecified whether with hypoxia or hypercapnia: Secondary | ICD-10-CM | POA: Diagnosis not present

## 2021-01-12 DIAGNOSIS — I509 Heart failure, unspecified: Secondary | ICD-10-CM | POA: Diagnosis not present

## 2021-01-12 DIAGNOSIS — E1165 Type 2 diabetes mellitus with hyperglycemia: Secondary | ICD-10-CM | POA: Diagnosis not present

## 2021-01-12 DIAGNOSIS — I10 Essential (primary) hypertension: Secondary | ICD-10-CM | POA: Diagnosis not present

## 2021-01-15 NOTE — Progress Notes (Signed)
Remote ICD transmission.   

## 2021-01-17 ENCOUNTER — Ambulatory Visit (INDEPENDENT_AMBULATORY_CARE_PROVIDER_SITE_OTHER): Payer: Medicare Other

## 2021-01-17 DIAGNOSIS — I5022 Chronic systolic (congestive) heart failure: Secondary | ICD-10-CM

## 2021-01-17 DIAGNOSIS — Z95810 Presence of automatic (implantable) cardiac defibrillator: Secondary | ICD-10-CM

## 2021-01-19 NOTE — Progress Notes (Signed)
EPIC Encounter for ICM Monitoring  Patient Name: Stacy Grimes is a 70 y.o. female Date: 01/19/2021 Primary Care Physican: Cyndi Bender, PA-C Primary Cardiologist: Fletcher Anon Electrophysiologist: Vergie Living Pacing:  97%    10/12/2020 Office Weight: 184 lbs   AT/AF Burden <1% (taking Aspirin & Plavix)   Spoke with husband and heart failure questions reviewed.  Pt asymptomatic for fluid accumulation.   Coruve thoracic impedance suggesting fluid levels returned to normal.   Prescribed:  Furosemide 20 mg Take 1 tablet (20 mg total) by mouth every other day.  May take an additional tablet AS NEEDED for weight gain of 3 lbs overnight or 5 lbs in one week. Do not exceed more than 3 additional doses.      Labs: 06/21/2020 Creatinine 0.85, BUN 19, Potassium 4.0, Sodium 140 06/20/2020 Creatinine 0.89, BUN 20, Potassium 3.8, Sodium 141, GFR >60  A complete set of results can be found in Results Review.   Recommendations:  No changes and encouraged to call if experiencing any fluid symptoms.   Follow-up plan: ICM clinic phone appointment on 02/19/2021.  91 day device clinic remote transmission scheduled 04/05/2021.     EP/Cardiology Office Visits:  03/06/2021 with Dr Fletcher Anon.    Missed appt on 11/28/2020 with Dr Caryl Comes.     Copy of ICM check sent to Dr. Caryl Comes.   3 month ICM trend: 01/17/2021.    12-14 Month ICM trend:       [name], RN 01/19/2021 4:08 PM

## 2021-01-24 ENCOUNTER — Ambulatory Visit: Payer: Medicare Other | Admitting: Cardiovascular Disease

## 2021-01-26 ENCOUNTER — Encounter: Payer: Self-pay | Admitting: Internal Medicine

## 2021-02-10 DIAGNOSIS — J449 Chronic obstructive pulmonary disease, unspecified: Secondary | ICD-10-CM | POA: Diagnosis not present

## 2021-02-15 ENCOUNTER — Telehealth: Payer: Self-pay

## 2021-02-15 NOTE — Telephone Encounter (Signed)
Nonbillable remote reviewed. Normal device function.   Numerous AMS episodes, EGMs show these to be SR so there is inappropriate mode switching.  Episodes are seconds, AF burden is less than 1% of the time, this is not a new finding. ? reprogramming, sent to triage Next remote 02/19/2021.  Naylee Breach, RN, CCDS, [employer]  FF noted. Patient is overdue for ROV follow up as she was last seen shortly after implant and has not follow up since. Remotes up to date. Patient of need for in clinic interrogation and visit with Dr. Caryl Comes. Will send to scheduling.

## 2021-02-19 ENCOUNTER — Ambulatory Visit (INDEPENDENT_AMBULATORY_CARE_PROVIDER_SITE_OTHER): Payer: Medicare Other

## 2021-02-19 DIAGNOSIS — Z95810 Presence of automatic (implantable) cardiac defibrillator: Secondary | ICD-10-CM

## 2021-02-19 DIAGNOSIS — I5022 Chronic systolic (congestive) heart failure: Secondary | ICD-10-CM

## 2021-02-21 NOTE — Progress Notes (Signed)
EPIC Encounter for ICM Monitoring  Patient Name: Stacy Grimes is a 71 y.o. female Date: 02/21/2021 Primary Care Physican: Cyndi Bender, PA-C Primary Cardiologist: Fletcher Anon Electrophysiologist: Vergie Living Pacing:  97% (02/14/2021 report) 10/12/2020 Office Weight: 184 lbs   AT/AF Burden <1% (taking Aspirin & Plavix)   Spoke with husband and heart failure questions reviewed.  Pt asymptomatic for fluid accumulation.  Reports feeling well at this time and voices no complaints.    Coruve thoracic impedance suggesting normal fluid levels.   Prescribed:  Furosemide 20 mg Take 1 tablet (20 mg total) by mouth every other day.  May take an additional tablet AS NEEDED for weight gain of 3 lbs overnight or 5 lbs in one week. Do not exceed more than 3 additional doses.      Labs: 06/21/2020 Creatinine 0.85, BUN 19, Potassium 4.0, Sodium 140 06/20/2020 Creatinine 0.89, BUN 20, Potassium 3.8, Sodium 141, GFR >60  A complete set of results can be found in Results Review.   Recommendations:  No changes and encouraged to call if experiencing any fluid symptoms.   Follow-up plan: ICM clinic phone appointment on 03/26/2021.  91 day device clinic remote transmission scheduled 04/05/2021.     EP/Cardiology Office Visits:  03/06/2021 with Dr Fletcher Anon.    05/01/2021 with Dr Caryl Comes.   Last OV with Dr Caryl Comes was 6/12/20219.    Copy of ICM check sent to Dr. Caryl Comes.   3 month ICM trend: 02/17/2021.     [name], RN 02/21/2021 8:20 AM

## 2021-03-01 ENCOUNTER — Other Ambulatory Visit: Payer: Self-pay

## 2021-03-01 ENCOUNTER — Telehealth: Payer: Self-pay | Admitting: Cardiovascular Disease

## 2021-03-01 MED ORDER — DIGOXIN 125 MCG PO TABS: ORAL_TABLET | ORAL | 0 refills | Status: DC

## 2021-03-01 MED ORDER — CLOPIDOGREL BISULFATE 75 MG PO TABS: 75.0000 mg | ORAL_TABLET | Freq: Every day | ORAL | 3 refills | Status: DC

## 2021-03-01 NOTE — Telephone Encounter (Signed)
°*  STAT* If patient is at the pharmacy, call can be transferred to refill team.   1. Which medications need to be refilled? (please list name of each medication and dose if known) Plavix 75mg , Lanoxin 0.125mg   2. Which pharmacy/location (including street and city if local pharmacy) is medication to be sent to? [place], [place]  3. Do they need a 30 day or 90 day supply? 30 days

## 2021-03-01 NOTE — Telephone Encounter (Signed)
Requested Prescriptions   Signed Prescriptions Disp Refills   clopidogrel (PLAVIX) 75 MG tablet 30 tablet 3    Sig: Take 1 tablet (75 mg total) by mouth daily.    Authorizing Provider: Kathlyn Sacramento A    Ordering User: Janan Ridge   digoxin (LANOXIN) 0.125 MG tablet 36 tablet 0    Sig: TAKE 1 TABLET BY MOUTH ON MONDAY, [place]    Authorizing Provider: Kathlyn Sacramento A    Ordering User: Janan Ridge

## 2021-03-06 ENCOUNTER — Ambulatory Visit: Payer: Medicare Other | Admitting: Cardiovascular Disease

## 2021-03-08 ENCOUNTER — Other Ambulatory Visit: Payer: Self-pay | Admitting: Internal Medicine

## 2021-03-13 DIAGNOSIS — J449 Chronic obstructive pulmonary disease, unspecified: Secondary | ICD-10-CM | POA: Diagnosis not present

## 2021-03-14 DIAGNOSIS — R413 Other amnesia: Secondary | ICD-10-CM | POA: Diagnosis not present

## 2021-03-14 DIAGNOSIS — E038 Other specified hypothyroidism: Secondary | ICD-10-CM | POA: Diagnosis not present

## 2021-03-14 DIAGNOSIS — E538 Deficiency of other specified B group vitamins: Secondary | ICD-10-CM | POA: Diagnosis not present

## 2021-03-14 DIAGNOSIS — F03918 Unspecified dementia, unspecified severity, with other behavioral disturbance: Secondary | ICD-10-CM | POA: Diagnosis not present

## 2021-03-14 DIAGNOSIS — E1165 Type 2 diabetes mellitus with hyperglycemia: Secondary | ICD-10-CM | POA: Diagnosis not present

## 2021-03-26 ENCOUNTER — Ambulatory Visit (INDEPENDENT_AMBULATORY_CARE_PROVIDER_SITE_OTHER): Payer: Medicare HMO

## 2021-03-26 DIAGNOSIS — Z95810 Presence of automatic (implantable) cardiac defibrillator: Secondary | ICD-10-CM

## 2021-03-26 DIAGNOSIS — I5022 Chronic systolic (congestive) heart failure: Secondary | ICD-10-CM

## 2021-03-28 NOTE — Progress Notes (Signed)
EPIC Encounter for ICM Monitoring  Patient Name: Stacy Grimes is a 71 y.o. female Date: 03/28/2021 Primary Care Physican: Cyndi Bender, PA-C Primary Cardiologist: Fletcher Anon Electrophysiologist: Vergie Living Pacing:  97% (02/14/2021 report) 10/12/2020 Office Weight: 184 lbs   AT/AF Burden <1% (taking Aspirin & Plavix)   Transmission reviewed.    Coruve thoracic impedance suggesting normal fluid levels.   Prescribed:  Furosemide 20 mg Take 1 tablet (20 mg total) by mouth every other day.  May take an additional tablet AS NEEDED for weight gain of 3 lbs overnight or 5 lbs in one week. Do not exceed more than 3 additional doses.      Labs: 06/21/2020 Creatinine 0.85, BUN 19, Potassium 4.0, Sodium 140 06/20/2020 Creatinine 0.89, BUN 20, Potassium 3.8, Sodium 141, GFR >60  A complete set of results can be found in Results Review.   Recommendations:  No changes.   Follow-up plan: ICM clinic phone appointment on 04/30/2021.  91 day device clinic remote transmission scheduled 04/05/2021.     EP/Cardiology Office Visits:  03/06/2021 with Dr Fletcher Anon rescheduled for 04/17/2021.    05/01/2021 with Dr Caryl Comes.   Last OV with Dr Caryl Comes was 6/12/20219.    Copy of ICM check sent to Dr. Caryl Comes.   3 month ICM trend: 03/24/2021.    [name], RN 03/28/2021 10:26 AM

## 2021-04-05 ENCOUNTER — Ambulatory Visit (INDEPENDENT_AMBULATORY_CARE_PROVIDER_SITE_OTHER): Payer: Medicare HMO

## 2021-04-05 DIAGNOSIS — I428 Other cardiomyopathies: Secondary | ICD-10-CM

## 2021-04-06 LAB — CUP PACEART REMOTE DEVICE CHECK
Battery Remaining Longevity: 26 mo
Battery Remaining Percentage: 35 %
Battery Voltage: 2.9 V
Brady Statistic AP VP Percent: 1 %
Brady Statistic AP VS Percent: 1 %
Brady Statistic AS VP Percent: 97 %
Brady Statistic AS VS Percent: 2.4 %
Brady Statistic RA Percent Paced: 1 %
Date Time Interrogation Session: 20230302034327
HighPow Impedance: 50 Ohm
HighPow Impedance: 50 Ohm
Implantable Lead Implant Date: 20110309
Implantable Lead Implant Date: 20110309
Implantable Lead Implant Date: 20110309
Implantable Lead Location: 753858
Implantable Lead Location: 753859
Implantable Lead Location: 753860
Implantable Lead Model: 7121
Implantable Pulse Generator Implant Date: 20180814
Lead Channel Impedance Value: 350 Ohm
Lead Channel Impedance Value: 450 Ohm
Lead Channel Impedance Value: 550 Ohm
Lead Channel Pacing Threshold Amplitude: 0.75 V
Lead Channel Pacing Threshold Amplitude: 0.75 V
Lead Channel Pacing Threshold Amplitude: 0.875 V
Lead Channel Pacing Threshold Pulse Width: 0.5 ms
Lead Channel Pacing Threshold Pulse Width: 0.5 ms
Lead Channel Pacing Threshold Pulse Width: 0.5 ms
Lead Channel Sensing Intrinsic Amplitude: 11.7 mV
Lead Channel Sensing Intrinsic Amplitude: 4.3 mV
Lead Channel Setting Pacing Amplitude: 2 V
Lead Channel Setting Pacing Amplitude: 2.25 V
Lead Channel Setting Pacing Amplitude: 2.5 V
Lead Channel Setting Pacing Pulse Width: 0.5 ms
Lead Channel Setting Pacing Pulse Width: 0.5 ms
Lead Channel Setting Sensing Sensitivity: 0.5 mV
Pulse Gen Serial Number: 9761609

## 2021-04-10 DIAGNOSIS — J449 Chronic obstructive pulmonary disease, unspecified: Secondary | ICD-10-CM | POA: Diagnosis not present

## 2021-04-12 NOTE — Progress Notes (Signed)
Remote ICD transmission.   

## 2021-04-17 ENCOUNTER — Telehealth: Payer: Self-pay | Admitting: Cardiovascular Disease

## 2021-04-17 ENCOUNTER — Ambulatory Visit: Payer: Self-pay | Admitting: Cardiovascular Disease

## 2021-04-17 NOTE — Telephone Encounter (Signed)
Patient cancelled appt with Dr.Arida today at 4. Dr. Fletcher Anon would like a virtual visit offered. L MOM for patient to call back and schedule.

## 2021-04-17 NOTE — Telephone Encounter (Addendum)
Patient was scheduled today with Dr. Fletcher Anon @ 4pm but cancelled ?Per Dr. Fletcher Anon patient will need an appt to discuss the medication concerns. ?Message  has been left for the patients husband to call back. ?Could offer the patient the same appt time for a virtual/telephone visit. ?

## 2021-04-17 NOTE — Telephone Encounter (Signed)
Pt c/o medication issue: ? ?1. Name of Medication: all cardiac medications  ? ?2. How are you currently taking this medication (dosage and times per day)?  ? ?3. Are you having a reaction (difficulty breathing--STAT)? nausea ? ?4. What is your medication issue? Patient spouse calling, states that patient has been feeling nauseous all day.  They called the paramedics and states that everything checks out.  Would like to know if patients medications could be causing.  Please call to discuss.   ? ? ?Patient spouse cancelled appointment for this afternoon ?

## 2021-04-30 ENCOUNTER — Ambulatory Visit (INDEPENDENT_AMBULATORY_CARE_PROVIDER_SITE_OTHER): Payer: Medicare HMO

## 2021-04-30 DIAGNOSIS — I5022 Chronic systolic (congestive) heart failure: Secondary | ICD-10-CM | POA: Diagnosis not present

## 2021-04-30 DIAGNOSIS — Z95810 Presence of automatic (implantable) cardiac defibrillator: Secondary | ICD-10-CM

## 2021-05-01 ENCOUNTER — Encounter: Payer: Medicare HMO | Admitting: Internal Medicine

## 2021-05-01 DIAGNOSIS — I428 Other cardiomyopathies: Secondary | ICD-10-CM

## 2021-05-01 DIAGNOSIS — I5022 Chronic systolic (congestive) heart failure: Secondary | ICD-10-CM

## 2021-05-01 DIAGNOSIS — Z95810 Presence of automatic (implantable) cardiac defibrillator: Secondary | ICD-10-CM

## 2021-05-01 NOTE — Progress Notes (Signed)
EPIC Encounter for ICM Monitoring ? ?Patient Name: Stacy Grimes is a 71 y.o. female ?Date: 05/01/2021 ?Primary Care Physican: Cyndi Bender, PA-C ?Primary Cardiologist: Fletcher Anon ?Electrophysiologist: Caryl Comes ?Bi-V Pacing:  97%  ?10/12/2020 Office Weight: 184 lbs ?  ?AT/AF Burden <1% (taking Aspirin & Plavix) ?  ?Transmission reviewed.  ?  ?Coruve thoracic impedance suggesting normal fluid levels. ?  ?Prescribed:  ?Furosemide 20 mg Take 1 tablet (20 mg total) by mouth every other day.  May take an additional tablet AS NEEDED for weight gain of 3 lbs overnight or 5 lbs in one week. Do not exceed more than 3 additional doses.    ?  ?Labs: ?06/21/2020 Creatinine 0.85, BUN 19, Potassium 4.0, Sodium 140 ?06/20/2020 Creatinine 0.89, BUN 20, Potassium 3.8, Sodium 141, GFR >60  ?A complete set of results can be found in Results Review. ?  ?Recommendations:  No changes. ?  ?Follow-up plan: ICM clinic phone appointment on 04/30/2021.  91 day device clinic remote transmission scheduled 04/05/2021.   ?  ?EP/Cardiology Office Visits:  03/06/2021 & 04/17/2021 with Dr Fletcher Anon rescheduled for 06/19/2021.    05/01/2021 with Dr Caryl Comes rescheduled to 05/23/2021.   Last OV with Dr Caryl Comes was 6/12/20219.  ?  ?Copy of ICM check sent to Dr. Caryl Comes.  ? ?3 month ICM trend: 04/30/2021. ? ? ? ?12-14 Month ICM trend:  ? ? ? ?[name], RN ?05/01/2021 ?3:28 PM ? ?

## 2021-05-11 DIAGNOSIS — J449 Chronic obstructive pulmonary disease, unspecified: Secondary | ICD-10-CM | POA: Diagnosis not present

## 2021-05-21 ENCOUNTER — Other Ambulatory Visit: Payer: Self-pay | Admitting: Medical

## 2021-05-23 ENCOUNTER — Encounter: Payer: Medicare HMO | Admitting: Internal Medicine

## 2021-06-04 ENCOUNTER — Ambulatory Visit (INDEPENDENT_AMBULATORY_CARE_PROVIDER_SITE_OTHER): Payer: Medicare HMO

## 2021-06-04 DIAGNOSIS — I5022 Chronic systolic (congestive) heart failure: Secondary | ICD-10-CM

## 2021-06-04 DIAGNOSIS — Z95810 Presence of automatic (implantable) cardiac defibrillator: Secondary | ICD-10-CM | POA: Diagnosis not present

## 2021-06-07 ENCOUNTER — Other Ambulatory Visit: Payer: Self-pay | Admitting: Cardiovascular Disease

## 2021-06-08 NOTE — Progress Notes (Signed)
EPIC Encounter for ICM Monitoring ? ?Patient Name: Stacy Grimes is a 71 y.o. female ?Date: 06/08/2021 ?Primary Care Physican: Cyndi Bender, PA-C ?Primary Cardiologist: Fletcher Anon ?Electrophysiologist: Caryl Comes ?Bi-V Pacing:  97%  ?10/12/2020 Office Weight: 184 lbs ?  ?AT/AF Burden 1.1% (taking Aspirin & Plavix) ?  ?Spoke with husband and heart failure questions reviewed.  Pt asymptomatic for fluid accumulation.  Reports feeling well at this time and voices no complaints.  ?  ?Coruve thoracic impedance suggesting normal fluid levels. ?  ?Prescribed:  ?Furosemide 20 mg Take 1 tablet (20 mg total) by mouth every other day.  May take an additional tablet AS NEEDED for weight gain of 3 lbs overnight or 5 lbs in one week. Do not exceed more than 3 additional doses.    ?  ?Labs: ?06/21/2020 Creatinine 0.85, BUN 19, Potassium 4.0, Sodium 140 ?06/20/2020 Creatinine 0.89, BUN 20, Potassium 3.8, Sodium 141, GFR >60  ?A complete set of results can be found in Results Review. ?  ?Recommendations:  No changes and encouraged to call if experiencing any fluid symptoms. ?  ?Follow-up plan: ICM clinic phone appointment on 07/09/2021.  91 day device clinic remote transmission scheduled 07/05/2021.   ?  ?EP/Cardiology Office Visits:  06/19/2021 with Dr Fletcher Anon.   07/04/2021 with Dr Caryl Comes.   Last OV with Dr Caryl Comes was 6/12/20219.  ?  ?Copy of ICM check sent to Dr. Caryl Comes.  ? ?3 month ICM trend: 06/04/2021. ? ? ? ?12-14 Month ICM trend:  ? ? ? ?[name], RN ?06/08/2021 ?9:02 AM ? ?

## 2021-06-10 DIAGNOSIS — J449 Chronic obstructive pulmonary disease, unspecified: Secondary | ICD-10-CM | POA: Diagnosis not present

## 2021-06-19 ENCOUNTER — Other Ambulatory Visit: Payer: Self-pay | Admitting: Cardiovascular Disease

## 2021-06-19 ENCOUNTER — Ambulatory Visit: Payer: Medicare HMO | Admitting: Cardiovascular Disease

## 2021-07-04 ENCOUNTER — Encounter: Payer: Medicare HMO | Admitting: Internal Medicine

## 2021-07-05 ENCOUNTER — Ambulatory Visit (INDEPENDENT_AMBULATORY_CARE_PROVIDER_SITE_OTHER): Payer: Medicare HMO

## 2021-07-05 DIAGNOSIS — I428 Other cardiomyopathies: Secondary | ICD-10-CM | POA: Diagnosis not present

## 2021-07-05 LAB — CUP PACEART REMOTE DEVICE CHECK
Battery Remaining Longevity: 24 mo
Battery Remaining Percentage: 31 %
Battery Voltage: 2.9 V
Brady Statistic AP VP Percent: 1 %
Brady Statistic AP VS Percent: 1 %
Brady Statistic AS VP Percent: 98 %
Brady Statistic AS VS Percent: 2.1 %
Brady Statistic RA Percent Paced: 1 %
Date Time Interrogation Session: 20230601020016
HighPow Impedance: 51 Ohm
HighPow Impedance: 51 Ohm
Implantable Lead Implant Date: 20110309
Implantable Lead Implant Date: 20110309
Implantable Lead Implant Date: 20110309
Implantable Lead Location: 753858
Implantable Lead Location: 753859
Implantable Lead Location: 753860
Implantable Lead Model: 7121
Implantable Pulse Generator Implant Date: 20180814
Lead Channel Impedance Value: 330 Ohm
Lead Channel Impedance Value: 510 Ohm
Lead Channel Impedance Value: 560 Ohm
Lead Channel Pacing Threshold Amplitude: 0.75 V
Lead Channel Pacing Threshold Amplitude: 0.75 V
Lead Channel Pacing Threshold Amplitude: 0.875 V
Lead Channel Pacing Threshold Pulse Width: 0.5 ms
Lead Channel Pacing Threshold Pulse Width: 0.5 ms
Lead Channel Pacing Threshold Pulse Width: 0.5 ms
Lead Channel Sensing Intrinsic Amplitude: 11.7 mV
Lead Channel Sensing Intrinsic Amplitude: 3.7 mV
Lead Channel Setting Pacing Amplitude: 2 V
Lead Channel Setting Pacing Amplitude: 2.25 V
Lead Channel Setting Pacing Amplitude: 2.5 V
Lead Channel Setting Pacing Pulse Width: 0.5 ms
Lead Channel Setting Pacing Pulse Width: 0.5 ms
Lead Channel Setting Sensing Sensitivity: 0.5 mV
Pulse Gen Serial Number: 9761609

## 2021-07-09 ENCOUNTER — Other Ambulatory Visit: Payer: Self-pay | Admitting: Cardiovascular Disease

## 2021-07-09 ENCOUNTER — Ambulatory Visit (INDEPENDENT_AMBULATORY_CARE_PROVIDER_SITE_OTHER): Payer: Medicare HMO

## 2021-07-09 DIAGNOSIS — I5022 Chronic systolic (congestive) heart failure: Secondary | ICD-10-CM | POA: Diagnosis not present

## 2021-07-09 DIAGNOSIS — Z95810 Presence of automatic (implantable) cardiac defibrillator: Secondary | ICD-10-CM

## 2021-07-09 NOTE — Progress Notes (Signed)
EPIC Encounter for ICM Monitoring  Patient Name: Stacy Grimes is a 71 y.o. female Date: 07/09/2021 Primary Care Physican: Cyndi Bender, PA-C Primary Cardiologist: Fletcher Anon Electrophysiologist: Vergie Living Pacing:  97%  10/12/2020 Office Weight: 184 lbs   AT/AF Burden 1.1% (taking Aspirin & Plavix)   Spoke with husband and heart failure questions reviewed.  Pt asymptomatic for fluid accumulation.     Coruve thoracic impedance suggesting normal fluid levels.   Prescribed:  Furosemide 20 mg Take 1 tablet (20 mg total) by mouth every other day.  May take an additional tablet AS NEEDED for weight gain of 3 lbs overnight or 5 lbs in one week. Do not exceed more than 3 additional doses.      Labs: 06/21/2020 Creatinine 0.85, BUN 19, Potassium 4.0, Sodium 140 06/20/2020 Creatinine 0.89, BUN 20, Potassium 3.8, Sodium 141, GFR >60  A complete set of results can be found in Results Review.   Recommendations:  No changes and encouraged to call if experiencing any fluid symptoms.   Follow-up plan: ICM clinic phone appointment on 08/13/2021.  91 day device clinic remote transmission scheduled 10/04/2021.     EP/Cardiology Office Visits:  07/10/2021 with Dr Fletcher Anon.   08/08/2021 with Dr Caryl Comes.   Last OV with Dr Caryl Comes was 07/16/2017.    Copy of ICM check sent to Dr. Caryl Comes.   3 month ICM trend: 07/07/2021.    [name], RN 07/09/2021 1:56 PM

## 2021-07-10 ENCOUNTER — Ambulatory Visit: Payer: Medicare HMO | Admitting: Cardiovascular Disease

## 2021-07-10 ENCOUNTER — Other Ambulatory Visit
Admission: EM | Admit: 2021-07-10 | Discharge: 2021-07-10 | Disposition: A | Discharge status: Home / Self Care | Payer: Medicare HMO | Source: Ambulatory Visit | Attending: Cardiovascular Disease | Admitting: Cardiovascular Disease

## 2021-07-10 ENCOUNTER — Encounter: Payer: Self-pay | Admitting: Cardiovascular Disease

## 2021-07-10 VITALS — BP 126/70 | HR 90 | Ht 63.0 in | Wt 179.1 lb

## 2021-07-10 DIAGNOSIS — I5022 Chronic systolic (congestive) heart failure: Secondary | ICD-10-CM | POA: Insufficient documentation

## 2021-07-10 DIAGNOSIS — I700 Atherosclerosis of aorta: Secondary | ICD-10-CM

## 2021-07-10 DIAGNOSIS — Z8673 Personal history of transient ischemic attack (TIA), and cerebral infarction without residual deficits: Secondary | ICD-10-CM | POA: Diagnosis not present

## 2021-07-10 DIAGNOSIS — I428 Other cardiomyopathies: Secondary | ICD-10-CM

## 2021-07-10 DIAGNOSIS — E782 Mixed hyperlipidemia: Secondary | ICD-10-CM | POA: Diagnosis not present

## 2021-07-10 DIAGNOSIS — J449 Chronic obstructive pulmonary disease, unspecified: Secondary | ICD-10-CM

## 2021-07-10 DIAGNOSIS — Z95810 Presence of automatic (implantable) cardiac defibrillator: Secondary | ICD-10-CM | POA: Diagnosis not present

## 2021-07-10 DIAGNOSIS — I951 Orthostatic hypotension: Secondary | ICD-10-CM

## 2021-07-10 LAB — BASIC METABOLIC PANEL
Anion gap: 10 (ref 5–15)
BUN: 11 mg/dL (ref 8–23)
CO2: 34 mmol/L — ABNORMAL HIGH (ref 22–32)
Calcium: 8.5 mg/dL — ABNORMAL LOW (ref 8.9–10.3)
Chloride: 97 mmol/L — ABNORMAL LOW (ref 98–111)
Creatinine, Ser: 0.7 mg/dL (ref 0.44–1.00)
GFR, Estimated: >60 mL/min (ref >60)
Glucose, Bld: 111 mg/dL — ABNORMAL HIGH (ref 70–99)
Potassium: 3.2 mmol/L — ABNORMAL LOW (ref 3.5–5.1)
Sodium: 141 mmol/L (ref 135–145)

## 2021-07-10 LAB — CBC
HCT: 36 % (ref 36.0–46.0)
Hemoglobin: 10.5 g/dL — ABNORMAL LOW (ref 12.0–15.0)
MCH: 23.8 pg — ABNORMAL LOW (ref 26.0–34.0)
MCHC: 29.2 g/dL — ABNORMAL LOW (ref 30.0–36.0)
MCV: 81.4 fL (ref 80.0–100.0)
Platelets: 308 K/uL (ref 150–400)
RBC: 4.42 MIL/uL (ref 3.87–5.11)
RDW: 18.4 % — ABNORMAL HIGH (ref 11.5–15.5)
WBC: 8.6 K/uL (ref 4.0–10.5)
nRBC: 0 % (ref 0.0–0.2)

## 2021-07-10 LAB — DIGOXIN LEVEL: Digoxin Level: 0.3 ng/mL — ABNORMAL LOW (ref 0.8–2.0)

## 2021-07-10 NOTE — Progress Notes (Unsigned)
Cardiology Office Note   Date:  07/10/2021   ID:  Stacy Grimes, DOB 1950/09/21, MRN [id number]  PCP:  Cyndi Bender, PA-C  Cardiologist:   Kathlyn Sacramento, MD   Chief Complaint  Patient presents with   Other    3 month f/u c/o nausea, fatigue and weakness. Meds reviewed verbally with pt.      History of Present Illness: Stacy Grimes is a 71 y.o. female who is here today for follow-up visit regarding chronic systolic heart failure due to nonischemic cardiomyopathy.   She has known history of orthostatic hypotension, chronic systolic heart failure due to nonischemic cardiomyopathy status post ICD placement by Dr. Caryl Comes in 2018, COPD on home oxygen, essential hypertension, abdominal aortic aneurysm, hypothyroidism, mild dementia, remote tobacco use and sleep apnea. She has severe COPD and uses oxygen 3 to 4 L. She had previous cardiac catheterization in 2010 which showed an EF of 15 to 20% with no obstructive disease.  She had improvement in LV systolic function to the low normal range. She was hospitalized in 2020 due to syncope.  She was noted to be hypotensive.  The episode was felt to be due to dehydration  She had previous stroke and she is on dual antiplatelet therapy for that indication.  Most recent echocardiogram in May 2022 showed an EF of 20 to 25% with global hypokinesis.  Heart failure medications have been limited by low blood pressure.  History is somewhat limited given her dementia and she does not complain much.  Her husband does most of the talking.   She reports no chest pain and stable exertional dyspnea.  She feels nauseous and weak on occasions.  She takes her medications regularly.    Past Medical History:  Diagnosis Date   AAA (abdominal aortic aneurysm) (HCC)    Anginal pain ([place])    Anxiety    Arthritis    Automatic implantable cardioverter-defibrillator in situ    Biventricular ICD  [facility]    COPD (chronic obstructive pulmonary disease) (HCC)     Deafness    left ear   Dementia (HCC)    Depression    Dizziness    Dysrhythmia    Fracture    history of spinal fracture   GERD (gastroesophageal reflux disease)    HFrEF (heart failure with reduced ejection fraction) ([place])    a. 2010 Cath: nonobs dzs, EF 15-20%; b. 09/2017 Echo: EF suboptimal. EF low nl; c. 04/2018 Echo: EF 40-45%.   Hypertension    Hypothyroidism    Non-ischemic cardiomyopathy ([place])    a. 2010 Cath: nonobs dzs, EF 15-20%; b. 2011 s/p SJM CRT-D; c. 09/2016 s/p Gen change; d. 09/2017 Echo: EF suboptimal. EF low nl; e. 04/2018 Echo: EF 40-45%.   Oxygen dependent    Palpitations    Shortness of breath dyspnea    Sleep apnea    Wheezing     Past Surgical History:  Procedure Laterality Date   ABDOMINAL HYSTERECTOMY     BIV ICD GENERATOR CHANGEOUT N/A 09/17/2016   Procedure: BiV ICD Generator Changeout;  Surgeon: Deboraha Sprang, MD;  Location: [facility];  Service: Cardiovascular;  Laterality: N/A;   BLADDER SURGERY     CARDIAC CATHETERIZATION     CARDIAC DEFIBRILLATOR PLACEMENT  4 yrs ago   pacemaker   CATARACT EXTRACTION W/PHACO Left 09/01/2014   Procedure: CATARACT EXTRACTION PHACO AND INTRAOCULAR LENS PLACEMENT ([place]);  Surgeon: Lyla Glassing, MD;  Location: [facility];  Service: Ophthalmology;  Laterality: Left;  [place]: 1:12.1    CATARACT EXTRACTION W/PHACO Right 09/29/2014   Procedure: CATARACT EXTRACTION PHACO AND INTRAOCULAR LENS PLACEMENT (IOC);  Surgeon: Lyla Glassing, MD;  Location: [facility];  Service: Ophthalmology;  Laterality: Right;  [place]: 00:52.7    CHOLECYSTECTOMY     COLON SURGERY     COLONOSCOPY     COLONOSCOPY N/A 08/31/2012   Procedure: COLONOSCOPY;  Surgeon: Inda Castle, MD;  Location: [facility];  Service: Endoscopy;  Laterality: N/A;   ESOPHAGOGASTRODUODENOSCOPY N/A 07/06/2012   Procedure: ESOPHAGOGASTRODUODENOSCOPY (EGD);  Surgeon: Lafayette Dragon, MD;  Location: [facility];  Service: Endoscopy;  Laterality: N/A;   KNEE SURGERY Right     MIDDLE EAR SURGERY     SAVORY DILATION N/A 07/06/2012   Procedure: SAVORY DILATION;  Surgeon: Lafayette Dragon, MD;  Location: [facility];  Service: Endoscopy;  Laterality: N/A;   TEE WITHOUT CARDIOVERSION N/A 06/23/2020   Procedure: TRANSESOPHAGEAL ECHOCARDIOGRAM (TEE);  Surgeon: Minna Merritts, MD;  Location: [facility];  Service: Cardiovascular;  Laterality: N/A;   WRIST SURGERY Right      Current Outpatient Medications  Medication Sig Dispense Refill   albuterol (ACCUNEB) 0.63 MG/3ML nebulizer solution Take 1 ampule by nebulization every 6 (six) hours as needed for wheezing.     albuterol (PROVENTIL HFA;VENTOLIN HFA) 108 (90 Base) MCG/ACT inhaler Inhale 2 puffs into the lungs every 6 (six) hours as needed. 1 Inhaler 0   allopurinol (ZYLOPRIM) 300 MG tablet Take 300 mg by mouth 2 (two) times daily.     aspirin 81 MG EC tablet Take 81 mg by mouth daily.     atorvastatin (LIPITOR) 80 MG tablet Take 1 tablet (80 mg total) by mouth daily. 90 tablet 3   budesonide-formoterol (SYMBICORT) 160-4.5 MCG/ACT inhaler Inhale 2 puffs into the lungs 2 (two) times daily.     clopidogrel (PLAVIX) 75 MG tablet Take 1 tablet (75 mg total) by mouth daily. 30 tablet 3   digoxin (LANOXIN) 0.125 MG tablet TAKE 1 TABLET BY MOUTH ON MONDAY, WEDNESDAY AND FRIDAY 12 tablet 3   donepezil (ARICEPT) 5 MG tablet Take 5 mg by mouth at bedtime.     furosemide (LASIX) 20 MG tablet Take 1 tablet (20 mg total) by mouth every other day. May take an additional tablet AS NEEDED for weight gain of 3 lbs overnight or 5 lbs in one week. Do not exceed more than 3 additional doses. - Oral 45 tablet 0   gabapentin (NEURONTIN) 300 MG capsule Take 300 mg by mouth 2 (two) times daily as needed (RLS symptoms/ pain).      hydrOXYzine (VISTARIL) 25 MG capsule Take 25 mg by mouth at bedtime as needed for anxiety.     levothyroxine (SYNTHROID) 112 MCG tablet Take 112 mcg by mouth daily before breakfast.     losartan (COZAAR) 25 MG tablet  Take 0.5 tablets (12.5 mg total) by mouth daily. ANY FURTHER REFILLS CAN BE DISCUSSED AT UPCOMING APPOINTMENT. 15 tablet 0   memantine (NAMENDA) 5 MG tablet Take by mouth in the morning and at bedtime.     Menthol, Topical Analgesic, (BENGAY EX) Apply 1 application topically daily as needed (back pain).     metFORMIN (GLUCOPHAGE-XR) 500 MG 24 hr tablet Take 500 mg by mouth 2 (two) times daily with a meal.      metoprolol succinate (TOPROL-XL) 25 MG 24 hr tablet Take 25 mg by mouth daily.     omeprazole (PRILOSEC) 20  MG capsule Take 1 capsule (20 mg total) by mouth every morning. 30 capsule 2   Sennosides (EX-LAX PO) Take 2 tablets by mouth at bedtime as needed (constipation).     tiotropium (SPIRIVA) 18 MCG inhalation capsule Place 1 capsule (18 mcg total) into inhaler and inhale daily as needed (shortness of breath). 30 capsule 12   venlafaxine (EFFEXOR) 75 MG tablet Take 75 mg by mouth 2 (two) times daily.     vitamin B-12 (CYANOCOBALAMIN) 1000 MCG tablet Take 1,000 mcg by mouth 2 (two) times daily.      No current facility-administered medications for this visit.    Allergies:   Codeine and Contrast media [iodinated contrast media]    Social History:  The patient  reports that she quit smoking about 19 years ago. Her smoking use included cigarettes. She has a 60.00 pack-year smoking history. She has never used smokeless tobacco. She reports that she does not drink alcohol and does not use drugs.   Family History:  The patient's family history includes Breast cancer in her mother; Colon cancer (age of onset: 94) in her brother; Heart attack in her sister and sister; Heart disease in her father; Hypertension in her brother and mother; Stroke in her mother.    ROS:  Please see the history of present illness.   Otherwise, review of systems are positive for none.   All other systems are reviewed and negative.    PHYSICAL EXAM: VS:  BP 126/70 (BP Location: Left Wrist, Patient Position:  Sitting, Cuff Size: Normal)   Pulse 90   Ht 5\' 3"  (1.6 m)   Wt 179 lb 2 oz (81.3 kg)   SpO2 90%   BMI 31.73 kg/m  , BMI Body mass index is 31.73 kg/m. GEN: Well nourished, well developed, in no acute distress  HEENT: normal  Neck: no JVD, carotid bruits, or masses Cardiac: RRR; no murmurs, rubs, or gallops,no edema  Respiratory:  clear to auscultation bilaterally, normal work of breathing GI: soft, nontender, nondistended, + BS MS: no deformity or atrophy  Skin: warm and dry, no rash Neuro:  Strength and sensation are intact Psych: euthymic mood, full affect   EKG:  EKG is not ordered today. EKG from November was reviewed and showed atrial sensed ventricular paced rhythm   Recent Labs: 07/18/2020: BUN CANCELED; Creatinine, Ser CANCELED; Potassium CANCELED; Sodium CANCELED; TSH CANCELED 10/12/2020: ALT 7    Lipid Panel    Component Value Date/Time   CHOL 152 10/12/2020 1559   TRIG 211 (H) 10/12/2020 1559   HDL 53 10/12/2020 1559   CHOLHDL 2.9 10/12/2020 1559   CHOLHDL 3.7 06/21/2020 0408   VLDL 22 06/21/2020 0408   LDLCALC 65 10/12/2020 1559      Wt Readings from Last 3 Encounters:  07/10/21 179 lb 2 oz (81.3 kg)  10/12/20 184 lb (83.5 kg)  07/18/20 188 lb (85.3 kg)            View : No data to display.            ASSESSMENT AND PLAN:  1.  Chronic systolic heart failure: This is due to nonischemic cardiomyopathy.  Currently New York Heart Association class II.  No significant volume overload and she continues to be on small dose furosemide 20 mg every other daily.  Continue small dose Toprol and lisinopril.  Continue digoxin.  I requested routine labs today including digoxin level.  We should consider adding Farxiga in the near future if renal function  is stable.  2.  Orthostatic hypotension: This has limited advancement of her heart failure medications.  3.  Severe COPD: On home oxygen.  4.  Dementia, seems to be stable overall.  5.  Status post ICD  placement: Followed by Dr. Caryl Comes.    Disposition:   FU with me in 6 months  Signed,  Kathlyn Sacramento, MD  07/10/2021 3:20 PM    [place]

## 2021-07-10 NOTE — Patient Instructions (Signed)
Medication Instructions:   Your physician has recommended you make the following change in your medication:    STOP taking your Aspirin.   *If you need a refill on your cardiac medications before your next appointment, please call your pharmacy*   Lab Work:  After your appointment today, please go to the medical mall for a lab draw (BMP, CBC, and Digoxin level)   Testing/Procedures:  None ordered   Follow-Up: At [facility], you and your health needs are our priority.  As part of our continuing mission to provide you with exceptional heart care, we have created designated Provider Care Teams.  These Care Teams include your primary Cardiologist (physician) and Advanced Practice Providers (APPs -  Physician Assistants and Nurse Practitioners) who all work together to provide you with the care you need, when you need it.  We recommend signing up for the patient portal called "MyChart".  Sign up information is provided on this After Visit Summary.  MyChart is used to connect with patients for Virtual Visits (Telemedicine).  Patients are able to view lab/test results, encounter notes, upcoming appointments, etc.  Non-urgent messages can be sent to your provider as well.   To learn more about what you can do with MyChart, go to [email or web address].    Your next appointment:   6 month(s)  The format for your next appointment:   In Person  Provider:   You may see Kathlyn Sacramento, MD or one of the following Advanced Practice Providers on your designated Care Team:   Murray Hodgkins, NP Christell Faith, PA-C Cadence Kathlen Mody, [place]    Other Instructions    Important Information About Sugar

## 2021-07-11 DIAGNOSIS — J449 Chronic obstructive pulmonary disease, unspecified: Secondary | ICD-10-CM | POA: Diagnosis not present

## 2021-07-12 ENCOUNTER — Telehealth: Payer: Self-pay | Admitting: Cardiovascular Disease

## 2021-07-12 ENCOUNTER — Telehealth: Payer: Self-pay

## 2021-07-12 MED ORDER — LOSARTAN POTASSIUM 25 MG PO TABS: 12.5000 mg | ORAL_TABLET | Freq: Every day | ORAL | 3 refills | Status: DC

## 2021-07-12 MED ORDER — CLOPIDOGREL BISULFATE 75 MG PO TABS: 75.0000 mg | ORAL_TABLET | Freq: Every day | ORAL | 3 refills | Status: DC

## 2021-07-12 MED ORDER — ATORVASTATIN CALCIUM 80 MG PO TABS: 80.0000 mg | ORAL_TABLET | Freq: Every day | ORAL | 3 refills | Status: AC

## 2021-07-12 MED ORDER — POTASSIUM CHLORIDE CRYS ER 20 MEQ PO TBCR: 20.0000 meq | EXTENDED_RELEASE_TABLET | Freq: Every day | ORAL | 6 refills | Status: DC

## 2021-07-12 NOTE — Telephone Encounter (Signed)
-----   Message from Emily Filbert, RN sent at 07/12/2021 10:53 AM EDT -----  ----- Message ----- From: Wellington Hampshire, MD Sent: 07/12/2021   8:43 AM EDT To: Izell Candelaria Arenas Parris-Godley, RN  Stable anemia and normal renal function.  Potassium is low.  Add potassium chloride 20 mEq once daily.

## 2021-07-12 NOTE — Telephone Encounter (Signed)
*  STAT* If patient is at the pharmacy, call can be transferred to refill team.   1. Which medications need to be refilled? (please list name of each medication and dose if known) atorvastatin (LIPITOR) 80 MG tablet  clopidogrel (PLAVIX) 75 MG tablet  losartan (COZAAR) 25 MG tablet  2. Which pharmacy/location (including street and city if local pharmacy) is medication to be sent to? [place], [place] - [address]  3. Do they need a 30 day or 90 day supply? [place]

## 2021-07-12 NOTE — Telephone Encounter (Signed)
Requested Prescriptions   Signed Prescriptions Disp Refills   clopidogrel (PLAVIX) 75 MG tablet 90 tablet 3    Sig: Take 1 tablet (75 mg total) by mouth daily.    Authorizing Provider: Kathlyn Sacramento A    Ordering User: Othelia Pulling C   atorvastatin (LIPITOR) 80 MG tablet 90 tablet 3    Sig: Take 1 tablet (80 mg total) by mouth daily.    Authorizing Provider: Kathlyn Sacramento A    Ordering User: Othelia Pulling C   losartan (COZAAR) 25 MG tablet 45 tablet 3    Sig: Take 0.5 tablets (12.5 mg total) by mouth daily.    Authorizing Provider: Kathlyn Sacramento A    Ordering User: Britt Bottom

## 2021-07-12 NOTE — Telephone Encounter (Signed)
Spoke w/ pt's husband, as he reports she is very HOH.  Advised him of results and Dr. Tyrell Antonio recommendation. He is agreeable and would like a 30 day supply sent in. Asked him to call back w/ any questions or concerns.

## 2021-07-13 NOTE — Progress Notes (Signed)
Remote ICD transmission.   

## 2021-07-16 DIAGNOSIS — Z79899 Other long term (current) drug therapy: Secondary | ICD-10-CM | POA: Diagnosis not present

## 2021-07-16 DIAGNOSIS — G309 Alzheimer's disease, unspecified: Secondary | ICD-10-CM | POA: Diagnosis not present

## 2021-07-16 DIAGNOSIS — I509 Heart failure, unspecified: Secondary | ICD-10-CM | POA: Diagnosis not present

## 2021-07-16 DIAGNOSIS — E1169 Type 2 diabetes mellitus with other specified complication: Secondary | ICD-10-CM | POA: Diagnosis not present

## 2021-07-16 DIAGNOSIS — J9610 Chronic respiratory failure, unspecified whether with hypoxia or hypercapnia: Secondary | ICD-10-CM | POA: Diagnosis not present

## 2021-07-16 DIAGNOSIS — F0280 Dementia in other diseases classified elsewhere without behavioral disturbance: Secondary | ICD-10-CM | POA: Diagnosis not present

## 2021-07-16 DIAGNOSIS — M109 Gout, unspecified: Secondary | ICD-10-CM | POA: Diagnosis not present

## 2021-07-16 DIAGNOSIS — I10 Essential (primary) hypertension: Secondary | ICD-10-CM | POA: Diagnosis not present

## 2021-07-16 DIAGNOSIS — Z8673 Personal history of transient ischemic attack (TIA), and cerebral infarction without residual deficits: Secondary | ICD-10-CM | POA: Diagnosis not present

## 2021-07-16 DIAGNOSIS — J449 Chronic obstructive pulmonary disease, unspecified: Secondary | ICD-10-CM | POA: Diagnosis not present

## 2021-07-16 DIAGNOSIS — Z1331 Encounter for screening for depression: Secondary | ICD-10-CM | POA: Diagnosis not present

## 2021-07-16 DIAGNOSIS — E039 Hypothyroidism, unspecified: Secondary | ICD-10-CM | POA: Diagnosis not present

## 2021-07-31 DIAGNOSIS — L720 Epidermal cyst: Secondary | ICD-10-CM | POA: Diagnosis not present

## 2021-08-06 DIAGNOSIS — I951 Orthostatic hypotension: Secondary | ICD-10-CM | POA: Insufficient documentation

## 2021-08-08 ENCOUNTER — Encounter: Payer: Medicare HMO | Admitting: Internal Medicine

## 2021-08-08 DIAGNOSIS — Z95810 Presence of automatic (implantable) cardiac defibrillator: Secondary | ICD-10-CM

## 2021-08-08 DIAGNOSIS — I5022 Chronic systolic (congestive) heart failure: Secondary | ICD-10-CM

## 2021-08-08 DIAGNOSIS — I951 Orthostatic hypotension: Secondary | ICD-10-CM

## 2021-08-10 DIAGNOSIS — J449 Chronic obstructive pulmonary disease, unspecified: Secondary | ICD-10-CM | POA: Diagnosis not present

## 2021-08-15 ENCOUNTER — Telehealth: Payer: Self-pay

## 2021-08-15 NOTE — Telephone Encounter (Signed)
I spoke with the patient husband and he states she will not be sending a transmission with her remote monitor. He states she has dementia and come in to see Dr. Caryl Comes on 08/21/2021.

## 2021-08-15 NOTE — Telephone Encounter (Signed)
ICM monthly remote transmission rescheduled to 09/17/2021 due to spouse declined to send manual transmission since patient will be seen in office 7/18 by Dr Caryl Comes.

## 2021-08-20 ENCOUNTER — Telehealth: Payer: Self-pay | Admitting: Cardiovascular Disease

## 2021-08-20 DIAGNOSIS — F03918 Unspecified dementia, unspecified severity, with other behavioral disturbance: Secondary | ICD-10-CM | POA: Diagnosis not present

## 2021-08-20 DIAGNOSIS — R413 Other amnesia: Secondary | ICD-10-CM | POA: Diagnosis not present

## 2021-08-20 DIAGNOSIS — F331 Major depressive disorder, recurrent, moderate: Secondary | ICD-10-CM | POA: Diagnosis not present

## 2021-08-20 DIAGNOSIS — R451 Restlessness and agitation: Secondary | ICD-10-CM | POA: Diagnosis not present

## 2021-08-20 DIAGNOSIS — Z8679 Personal history of other diseases of the circulatory system: Secondary | ICD-10-CM | POA: Diagnosis not present

## 2021-08-20 NOTE — Telephone Encounter (Signed)
   Pre-operative Risk Assessment    Patient Name: Stacy Grimes  DOB: 12-18-1950 MRN: [id number]      Request for Surgical Clearance    Procedure:  Excision of cyst  Date of Surgery:  Clearance TBD                                 Surgeon:  Dr. Bartholomew Boards Group or Practice Name:  [facility] Phone number:  [phone] Fax number:  [phone]   Type of Clearance Requested:  Pharmacy, Plavix 7 days    Type of Anesthesia:  Not Indicated   Additional requests/questions:    Signed, Pilar A Ham   08/20/2021, 3:15 PM

## 2021-08-21 ENCOUNTER — Encounter: Payer: Medicare HMO | Admitting: Internal Medicine

## 2021-08-21 NOTE — Telephone Encounter (Signed)
   Patient Name: Stacy Grimes  DOB: [birth year] MRN: [id number]  Primary Cardiologist: Kathlyn Sacramento, MD  Chart reviewed as part of pre-operative protocol coverage.   Patient on antiplatelet therapy for stroke. Please obtain clearance from PCP or neurology.   I will route this recommendation to the requesting party via Epic fax function and remove from pre-op pool.  Please call with questions.  [place], [place] 08/21/2021, 12:09 PM

## 2021-08-30 DIAGNOSIS — L720 Epidermal cyst: Secondary | ICD-10-CM | POA: Diagnosis not present

## 2021-09-28 NOTE — Progress Notes (Signed)
No ICM remote transmission received for 09/25/2021 and next ICM transmission scheduled for 10/15/2021.

## 2021-10-04 ENCOUNTER — Ambulatory Visit: Payer: Medicare HMO

## 2021-10-09 ENCOUNTER — Telehealth: Payer: Self-pay

## 2021-10-09 ENCOUNTER — Ambulatory Visit: Payer: Medicare HMO | Admitting: Internal Medicine

## 2021-10-09 NOTE — Telephone Encounter (Signed)
Returned call to husband per DPR as requested regarding patient's incontinence after taking diuretic.  Left message that was returning call and he would need to follow up with physician regarding suggestions for incontinence but taking diuretics are important to keep fluid from developing.  Also advised Merlin home monitor is showing as disconnected and provided [employer] support number and advised to call for assistance with monitor.

## 2021-10-10 ENCOUNTER — Other Ambulatory Visit: Payer: Self-pay | Admitting: Cardiovascular Disease

## 2021-10-10 ENCOUNTER — Other Ambulatory Visit: Payer: Self-pay

## 2021-10-10 MED ORDER — FUROSEMIDE 20 MG PO TABS: ORAL_TABLET | ORAL | 3 refills | Status: DC

## 2021-10-15 ENCOUNTER — Ambulatory Visit (INDEPENDENT_AMBULATORY_CARE_PROVIDER_SITE_OTHER): Payer: Medicare HMO

## 2021-10-15 DIAGNOSIS — Z95810 Presence of automatic (implantable) cardiac defibrillator: Secondary | ICD-10-CM

## 2021-10-15 DIAGNOSIS — I5022 Chronic systolic (congestive) heart failure: Secondary | ICD-10-CM | POA: Diagnosis not present

## 2021-10-17 NOTE — Progress Notes (Signed)
EPIC Encounter for ICM Monitoring  Patient Name: Stacy Grimes is a 71 y.o. female Date: 10/17/2021 Primary Care Physican: Cyndi Bender, PA-C Primary Cardiologist: Fletcher Anon Electrophysiologist: Vergie Living Pacing:  97%  10/12/2020 Office Weight: 184 lbs   AT/AF Burden 1.1% (taking Aspirin & Plavix)   Spoke with husband and heart failure questions reviewed.  Pt asymptomatic for fluid accumulation.  Reports feeling well at this time and voices no complaints.      Coruve thoracic impedance suggesting normal fluid levels with intermittent days with possible fluid accumulation within last month.   Prescribed:  Furosemide 20 mg Take 1 tablet (20 mg total) by mouth every other day.  May take an additional tablet AS NEEDED for weight gain of 3 lbs overnight or 5 lbs in one week. Do not exceed more than 3 additional doses.   Potassium 20 mEq take 1 tablet daily    Labs: 07/10/2021 Creatinine 0.70, BUN 11, Potassium 3.2, Sodium 141, GFR >60 A complete set of results can be found in Results Review.   Recommendations:  No changes and encouraged to call if experiencing any fluid symptoms.   Follow-up plan: ICM clinic phone appointment on 11/19/2021.  91 day device clinic remote transmission scheduled 01/06/2022.     EP/Cardiology Office Visits:  01/17/2022 with Dr Fletcher Anon.   03/03/2021 with Dr Caryl Comes.   Last OV with Dr Caryl Comes was 07/16/2017.    Copy of ICM check sent to Dr. Caryl Comes.    3 month ICM trend: 10/15/2021.    12-14 Month ICM trend:     [name], RN 10/17/2021 8:36 AM

## 2021-10-17 NOTE — Progress Notes (Signed)
ICM next remote transmission rescheduled to 10/23 due to patient will be out of town on 10/16

## 2021-10-19 DIAGNOSIS — M25532 Pain in left wrist: Secondary | ICD-10-CM | POA: Diagnosis not present

## 2021-10-19 DIAGNOSIS — Z8673 Personal history of transient ischemic attack (TIA), and cerebral infarction without residual deficits: Secondary | ICD-10-CM | POA: Diagnosis not present

## 2021-10-19 DIAGNOSIS — I10 Essential (primary) hypertension: Secondary | ICD-10-CM | POA: Diagnosis not present

## 2021-10-19 DIAGNOSIS — E1169 Type 2 diabetes mellitus with other specified complication: Secondary | ICD-10-CM | POA: Diagnosis not present

## 2021-10-19 DIAGNOSIS — J449 Chronic obstructive pulmonary disease, unspecified: Secondary | ICD-10-CM | POA: Diagnosis not present

## 2021-10-19 DIAGNOSIS — F0280 Dementia in other diseases classified elsewhere without behavioral disturbance: Secondary | ICD-10-CM | POA: Diagnosis not present

## 2021-10-19 DIAGNOSIS — J9610 Chronic respiratory failure, unspecified whether with hypoxia or hypercapnia: Secondary | ICD-10-CM | POA: Diagnosis not present

## 2021-10-19 DIAGNOSIS — G309 Alzheimer's disease, unspecified: Secondary | ICD-10-CM | POA: Diagnosis not present

## 2021-10-19 DIAGNOSIS — I509 Heart failure, unspecified: Secondary | ICD-10-CM | POA: Diagnosis not present

## 2021-10-23 ENCOUNTER — Other Ambulatory Visit: Payer: Self-pay

## 2021-10-23 ENCOUNTER — Inpatient Hospital Stay (HOSPITAL_COMMUNITY)
Admit: 2021-10-23 | Discharge: 2021-10-23 | Disposition: A | Discharge status: Home / Self Care | Payer: Medicare HMO | Attending: Pulmonary Disease | Admitting: Pulmonary Disease

## 2021-10-23 ENCOUNTER — Emergency Department: Payer: Medicare HMO

## 2021-10-23 ENCOUNTER — Inpatient Hospital Stay: Payer: Medicare HMO | Admitting: Anesthesiology

## 2021-10-23 ENCOUNTER — Encounter
Admission: EM | Disposition: A | Discharge status: Skilled Nursing Facility | Payer: Self-pay | Source: Home / Self Care | Attending: Internal Medicine

## 2021-10-23 ENCOUNTER — Inpatient Hospital Stay: Payer: Medicare HMO

## 2021-10-23 ENCOUNTER — Inpatient Hospital Stay
Admission: EM | Admit: 2021-10-23 | Discharge: 2021-10-30 | DRG: 480 | Disposition: A | Discharge status: Skilled Nursing Facility | Payer: Medicare HMO | Attending: Internal Medicine | Admitting: Internal Medicine

## 2021-10-23 DIAGNOSIS — S72009A Fracture of unspecified part of neck of unspecified femur, initial encounter for closed fracture: Secondary | ICD-10-CM

## 2021-10-23 DIAGNOSIS — Z8781 Personal history of (healed) traumatic fracture: Secondary | ICD-10-CM | POA: Diagnosis not present

## 2021-10-23 DIAGNOSIS — D638 Anemia in other chronic diseases classified elsewhere: Secondary | ICD-10-CM | POA: Diagnosis not present

## 2021-10-23 DIAGNOSIS — F0394 Unspecified dementia, unspecified severity, with anxiety: Secondary | ICD-10-CM | POA: Diagnosis present

## 2021-10-23 DIAGNOSIS — S72041A Displaced fracture of base of neck of right femur, initial encounter for closed fracture: Secondary | ICD-10-CM | POA: Diagnosis not present

## 2021-10-23 DIAGNOSIS — D72829 Elevated white blood cell count, unspecified: Secondary | ICD-10-CM | POA: Diagnosis present

## 2021-10-23 DIAGNOSIS — Z87891 Personal history of nicotine dependence: Secondary | ICD-10-CM

## 2021-10-23 DIAGNOSIS — F0393 Unspecified dementia, unspecified severity, with mood disturbance: Secondary | ICD-10-CM | POA: Diagnosis present

## 2021-10-23 DIAGNOSIS — Z823 Family history of stroke: Secondary | ICD-10-CM

## 2021-10-23 DIAGNOSIS — W19XXXA Unspecified fall, initial encounter: Principal | ICD-10-CM

## 2021-10-23 DIAGNOSIS — S72001D Fracture of unspecified part of neck of right femur, subsequent encounter for closed fracture with routine healing: Secondary | ICD-10-CM | POA: Diagnosis not present

## 2021-10-23 DIAGNOSIS — Z7189 Other specified counseling: Secondary | ICD-10-CM | POA: Diagnosis not present

## 2021-10-23 DIAGNOSIS — Y92002 Bathroom of unspecified non-institutional (private) residence single-family (private) house as the place of occurrence of the external cause: Secondary | ICD-10-CM

## 2021-10-23 DIAGNOSIS — Z7951 Long term (current) use of inhaled steroids: Secondary | ICD-10-CM

## 2021-10-23 DIAGNOSIS — Z800 Family history of malignant neoplasm of digestive organs: Secondary | ICD-10-CM

## 2021-10-23 DIAGNOSIS — I2510 Atherosclerotic heart disease of native coronary artery without angina pectoris: Secondary | ICD-10-CM | POA: Diagnosis present

## 2021-10-23 DIAGNOSIS — Z8249 Family history of ischemic heart disease and other diseases of the circulatory system: Secondary | ICD-10-CM

## 2021-10-23 DIAGNOSIS — E039 Hypothyroidism, unspecified: Secondary | ICD-10-CM | POA: Diagnosis present

## 2021-10-23 DIAGNOSIS — K219 Gastro-esophageal reflux disease without esophagitis: Secondary | ICD-10-CM | POA: Diagnosis present

## 2021-10-23 DIAGNOSIS — S72001A Fracture of unspecified part of neck of right femur, initial encounter for closed fracture: Secondary | ICD-10-CM | POA: Diagnosis not present

## 2021-10-23 DIAGNOSIS — M79671 Pain in right foot: Secondary | ICD-10-CM

## 2021-10-23 DIAGNOSIS — E1140 Type 2 diabetes mellitus with diabetic neuropathy, unspecified: Secondary | ICD-10-CM | POA: Diagnosis present

## 2021-10-23 DIAGNOSIS — S72141D Displaced intertrochanteric fracture of right femur, subsequent encounter for closed fracture with routine healing: Secondary | ICD-10-CM | POA: Diagnosis not present

## 2021-10-23 DIAGNOSIS — S72352D Displaced comminuted fracture of shaft of left femur, subsequent encounter for closed fracture with routine healing: Secondary | ICD-10-CM | POA: Diagnosis not present

## 2021-10-23 DIAGNOSIS — M810 Age-related osteoporosis without current pathological fracture: Secondary | ICD-10-CM | POA: Diagnosis present

## 2021-10-23 DIAGNOSIS — I771 Stricture of artery: Secondary | ICD-10-CM | POA: Diagnosis not present

## 2021-10-23 DIAGNOSIS — N170 Acute kidney failure with tubular necrosis: Secondary | ICD-10-CM | POA: Diagnosis not present

## 2021-10-23 DIAGNOSIS — M79604 Pain in right leg: Secondary | ICD-10-CM | POA: Diagnosis not present

## 2021-10-23 DIAGNOSIS — Z803 Family history of malignant neoplasm of breast: Secondary | ICD-10-CM

## 2021-10-23 DIAGNOSIS — D62 Acute posthemorrhagic anemia: Secondary | ICD-10-CM | POA: Diagnosis present

## 2021-10-23 DIAGNOSIS — I110 Hypertensive heart disease with heart failure: Secondary | ICD-10-CM | POA: Diagnosis present

## 2021-10-23 DIAGNOSIS — R579 Shock, unspecified: Secondary | ICD-10-CM

## 2021-10-23 DIAGNOSIS — I428 Other cardiomyopathies: Secondary | ICD-10-CM | POA: Diagnosis present

## 2021-10-23 DIAGNOSIS — Z7401 Bed confinement status: Secondary | ICD-10-CM | POA: Diagnosis not present

## 2021-10-23 DIAGNOSIS — S72091A Other fracture of head and neck of right femur, initial encounter for closed fracture: Secondary | ICD-10-CM | POA: Diagnosis not present

## 2021-10-23 DIAGNOSIS — E872 Acidosis, unspecified: Secondary | ICD-10-CM | POA: Diagnosis present

## 2021-10-23 DIAGNOSIS — I509 Heart failure, unspecified: Secondary | ICD-10-CM | POA: Diagnosis not present

## 2021-10-23 DIAGNOSIS — W010XXA Fall on same level from slipping, tripping and stumbling without subsequent striking against object, initial encounter: Secondary | ICD-10-CM | POA: Diagnosis present

## 2021-10-23 DIAGNOSIS — I255 Ischemic cardiomyopathy: Secondary | ICD-10-CM | POA: Diagnosis present

## 2021-10-23 DIAGNOSIS — I5021 Acute systolic (congestive) heart failure: Secondary | ICD-10-CM | POA: Diagnosis not present

## 2021-10-23 DIAGNOSIS — E669 Obesity, unspecified: Secondary | ICD-10-CM | POA: Diagnosis present

## 2021-10-23 DIAGNOSIS — J441 Chronic obstructive pulmonary disease with (acute) exacerbation: Secondary | ICD-10-CM | POA: Diagnosis present

## 2021-10-23 DIAGNOSIS — Z90710 Acquired absence of both cervix and uterus: Secondary | ICD-10-CM

## 2021-10-23 DIAGNOSIS — M25551 Pain in right hip: Secondary | ICD-10-CM

## 2021-10-23 DIAGNOSIS — I5023 Acute on chronic systolic (congestive) heart failure: Secondary | ICD-10-CM | POA: Diagnosis not present

## 2021-10-23 DIAGNOSIS — R578 Other shock: Secondary | ICD-10-CM | POA: Diagnosis not present

## 2021-10-23 DIAGNOSIS — I480 Paroxysmal atrial fibrillation: Secondary | ICD-10-CM | POA: Diagnosis present

## 2021-10-23 DIAGNOSIS — Z6837 Body mass index (BMI) 37.0-37.9, adult: Secondary | ICD-10-CM

## 2021-10-23 DIAGNOSIS — Z7984 Long term (current) use of oral hypoglycemic drugs: Secondary | ICD-10-CM

## 2021-10-23 DIAGNOSIS — S299XXA Unspecified injury of thorax, initial encounter: Secondary | ICD-10-CM | POA: Diagnosis not present

## 2021-10-23 DIAGNOSIS — J811 Chronic pulmonary edema: Secondary | ICD-10-CM | POA: Diagnosis not present

## 2021-10-23 DIAGNOSIS — Z79899 Other long term (current) drug therapy: Secondary | ICD-10-CM

## 2021-10-23 DIAGNOSIS — E119 Type 2 diabetes mellitus without complications: Secondary | ICD-10-CM | POA: Diagnosis not present

## 2021-10-23 DIAGNOSIS — J9622 Acute and chronic respiratory failure with hypercapnia: Secondary | ICD-10-CM | POA: Diagnosis present

## 2021-10-23 DIAGNOSIS — J9621 Acute and chronic respiratory failure with hypoxia: Secondary | ICD-10-CM | POA: Diagnosis not present

## 2021-10-23 DIAGNOSIS — R54 Age-related physical debility: Secondary | ICD-10-CM | POA: Diagnosis not present

## 2021-10-23 DIAGNOSIS — E870 Hyperosmolality and hypernatremia: Secondary | ICD-10-CM | POA: Diagnosis present

## 2021-10-23 DIAGNOSIS — Z7902 Long term (current) use of antithrombotics/antiplatelets: Secondary | ICD-10-CM

## 2021-10-23 DIAGNOSIS — Z95810 Presence of automatic (implantable) cardiac defibrillator: Secondary | ICD-10-CM

## 2021-10-23 DIAGNOSIS — J9690 Respiratory failure, unspecified, unspecified whether with hypoxia or hypercapnia: Secondary | ICD-10-CM | POA: Diagnosis not present

## 2021-10-23 DIAGNOSIS — I10 Essential (primary) hypertension: Secondary | ICD-10-CM | POA: Diagnosis not present

## 2021-10-23 DIAGNOSIS — S72141A Displaced intertrochanteric fracture of right femur, initial encounter for closed fracture: Principal | ICD-10-CM | POA: Diagnosis present

## 2021-10-23 DIAGNOSIS — I517 Cardiomegaly: Secondary | ICD-10-CM | POA: Diagnosis not present

## 2021-10-23 DIAGNOSIS — Z79890 Hormone replacement therapy (postmenopausal): Secondary | ICD-10-CM

## 2021-10-23 DIAGNOSIS — Z9981 Dependence on supplemental oxygen: Secondary | ICD-10-CM

## 2021-10-23 DIAGNOSIS — S72111A Displaced fracture of greater trochanter of right femur, initial encounter for closed fracture: Secondary | ICD-10-CM | POA: Diagnosis not present

## 2021-10-23 DIAGNOSIS — R609 Edema, unspecified: Secondary | ICD-10-CM | POA: Diagnosis not present

## 2021-10-23 DIAGNOSIS — I959 Hypotension, unspecified: Secondary | ICD-10-CM | POA: Diagnosis not present

## 2021-10-23 DIAGNOSIS — Z8673 Personal history of transient ischemic attack (TIA), and cerebral infarction without residual deficits: Secondary | ICD-10-CM

## 2021-10-23 HISTORY — PX: INTRAMEDULLARY (IM) NAIL INTERTROCHANTERIC: SHX5875

## 2021-10-23 LAB — BASIC METABOLIC PANEL
Anion gap: 8 (ref 5–15)
Anion gap: 12 (ref 5–15)
BUN: 13 mg/dL (ref 8–23)
BUN: 15 mg/dL (ref 8–23)
CO2: 34 mmol/L — ABNORMAL HIGH (ref 22–32)
CO2: 31 mmol/L (ref 22–32)
Calcium: 7.9 mg/dL — ABNORMAL LOW (ref 8.9–10.3)
Calcium: 7.9 mg/dL — ABNORMAL LOW (ref 8.9–10.3)
Chloride: 103 mmol/L (ref 98–111)
Chloride: 100 mmol/L (ref 98–111)
Creatinine, Ser: 0.88 mg/dL (ref 0.44–1.00)
Creatinine, Ser: 1.18 mg/dL — ABNORMAL HIGH (ref 0.44–1.00)
GFR, Estimated: >60 mL/min (ref >60)
GFR, Estimated: 49 mL/min — ABNORMAL LOW (ref >60)
Glucose, Bld: 119 mg/dL — ABNORMAL HIGH (ref 70–99)
Glucose, Bld: 138 mg/dL — ABNORMAL HIGH (ref 70–99)
Potassium: 4.3 mmol/L (ref 3.5–5.1)
Potassium: 3.6 mmol/L (ref 3.5–5.1)
Sodium: 145 mmol/L (ref 135–145)
Sodium: 143 mmol/L (ref 135–145)

## 2021-10-23 LAB — CBC
HCT: 32 % — ABNORMAL LOW (ref 36.0–46.0)
HCT: 26.1 % — ABNORMAL LOW (ref 36.0–46.0)
Hemoglobin: 9.1 g/dL — ABNORMAL LOW (ref 12.0–15.0)
Hemoglobin: 7.3 g/dL — ABNORMAL LOW (ref 12.0–15.0)
MCH: 22.5 pg — ABNORMAL LOW (ref 26.0–34.0)
MCH: 22.5 pg — ABNORMAL LOW (ref 26.0–34.0)
MCHC: 28.4 g/dL — ABNORMAL LOW (ref 30.0–36.0)
MCHC: 28 g/dL — ABNORMAL LOW (ref 30.0–36.0)
MCV: 79.2 fL — ABNORMAL LOW (ref 80.0–100.0)
MCV: 80.6 fL (ref 80.0–100.0)
Platelets: 287 10*3/uL (ref 150–400)
Platelets: 279 10*3/uL (ref 150–400)
RBC: 4.04 MIL/uL (ref 3.87–5.11)
RBC: 3.24 MIL/uL — ABNORMAL LOW (ref 3.87–5.11)
RDW: 18.3 % — ABNORMAL HIGH (ref 11.5–15.5)
RDW: 18.6 % — ABNORMAL HIGH (ref 11.5–15.5)
WBC: 8.4 10*3/uL (ref 4.0–10.5)
WBC: 15.7 10*3/uL — ABNORMAL HIGH (ref 4.0–10.5)
nRBC: 0 % (ref 0.0–0.2)
nRBC: 0.3 % — ABNORMAL HIGH (ref 0.0–0.2)

## 2021-10-23 LAB — IRON AND TIBC
Iron: 98 ug/dL (ref 28–170)
Saturation Ratios: 28 % (ref 10.4–31.8)
TIBC: 356 ug/dL (ref 250–450)
UIBC: 258 ug/dL

## 2021-10-23 LAB — CBG MONITORING, ED: Glucose-Capillary: 203 mg/dL — ABNORMAL HIGH (ref 70–99)

## 2021-10-23 LAB — ALBUMIN: Albumin: 2.5 g/dL — ABNORMAL LOW (ref 3.5–5.0)

## 2021-10-23 LAB — MRSA NEXT GEN BY PCR, NASAL: MRSA by PCR Next Gen: NOT DETECTED

## 2021-10-23 LAB — BLOOD GAS, ARTERIAL
Acid-Base Excess: 10.2 mmol/L — ABNORMAL HIGH (ref 0.0–2.0)
Bicarbonate: 38.2 mmol/L — ABNORMAL HIGH (ref 20.0–28.0)
O2 Content: 4 L/min
O2 Saturation: 90.5 %
Patient temperature: 37
pCO2 arterial: 66 mmHg (ref 32–48)
pH, Arterial: 7.37 (ref 7.35–7.45)
pO2, Arterial: 60 mmHg — ABNORMAL LOW (ref 83–108)

## 2021-10-23 LAB — PHOSPHORUS: Phosphorus: 4.2 mg/dL (ref 2.5–4.6)

## 2021-10-23 LAB — RETICULOCYTES
Immature Retic Fract: 18.3 % — ABNORMAL HIGH (ref 2.3–15.9)
RBC.: 4.09 MIL/uL (ref 3.87–5.11)
Retic Count, Absolute: 31.9 10*3/uL (ref 19.0–186.0)
Retic Ct Pct: 0.8 % (ref 0.4–3.1)

## 2021-10-23 LAB — BRAIN NATRIURETIC PEPTIDE: B Natriuretic Peptide: 316.7 pg/mL — ABNORMAL HIGH (ref 0.0–100.0)

## 2021-10-23 LAB — TROPONIN I (HIGH SENSITIVITY)
Troponin I (High Sensitivity): 8 ng/L (ref <18)
Troponin I (High Sensitivity): 10 ng/L (ref <18)
Troponin I (High Sensitivity): 73 ng/L — ABNORMAL HIGH (ref <18)

## 2021-10-23 LAB — GLUCOSE, CAPILLARY
Glucose-Capillary: 142 mg/dL — ABNORMAL HIGH (ref 70–99)
Glucose-Capillary: 138 mg/dL — ABNORMAL HIGH (ref 70–99)

## 2021-10-23 LAB — PREPARE RBC (CROSSMATCH)

## 2021-10-23 LAB — ABO/RH: ABO/RH(D): O POS

## 2021-10-23 LAB — MAGNESIUM: Magnesium: 1.4 mg/dL — ABNORMAL LOW (ref 1.7–2.4)

## 2021-10-23 LAB — FERRITIN: Ferritin: 33 ng/mL (ref 11–307)

## 2021-10-23 LAB — HEMOGLOBIN A1C
Hgb A1c MFr Bld: 7 % — ABNORMAL HIGH (ref 4.8–5.6)
Mean Plasma Glucose: 154.2 mg/dL

## 2021-10-23 LAB — LACTIC ACID, PLASMA: Lactic Acid, Venous: 3.9 mmol/L (ref 0.5–1.9)

## 2021-10-23 LAB — CORTISOL: Cortisol, Plasma: 23.4 ug/dL

## 2021-10-23 SURGERY — FIXATION, FRACTURE, INTERTROCHANTERIC, WITH INTRAMEDULLARY ROD
Anesthesia: General | Site: Hip | Laterality: Right

## 2021-10-23 MED ORDER — PANTOPRAZOLE SODIUM 40 MG PO TBEC
40.0000 mg | DELAYED_RELEASE_TABLET | Freq: Every day | ORAL | Status: DC
  Administered 2021-10-24 – 2021-10-30 (×7): 40 mg via ORAL
  Filled 2021-10-23 (×7): qty 1

## 2021-10-23 MED ORDER — SODIUM CHLORIDE 0.9 % IV SOLN: INTRAVENOUS | Status: DC

## 2021-10-23 MED ORDER — CEFAZOLIN SODIUM-DEXTROSE 2-4 GM/100ML-% IV SOLN
2.0000 g | Freq: Four times a day (QID) | INTRAVENOUS | Status: AC
  Administered 2021-10-23 – 2021-10-24 (×3): 2 g via INTRAVENOUS
  Filled 2021-10-23 (×3): qty 100

## 2021-10-23 MED ORDER — VASOPRESSIN 20 UNIT/ML IV SOLN
INTRAVENOUS | Status: DC | PRN
  Administered 2021-10-23: 2 [IU] via INTRAVENOUS
  Administered 2021-10-23 (×2): 1 [IU] via INTRAVENOUS

## 2021-10-23 MED ORDER — PERFLUTREN LIPID MICROSPHERE
1.0000 mL | INTRAVENOUS | Status: AC | PRN
  Administered 2021-10-23: 3 mL via INTRAVENOUS

## 2021-10-23 MED ORDER — BISACODYL 5 MG PO TBEC: 5.0000 mg | DELAYED_RELEASE_TABLET | Freq: Every day | ORAL | Status: DC | PRN

## 2021-10-23 MED ORDER — MIDAZOLAM HCL 2 MG/2ML IJ SOLN
INTRAMUSCULAR | Status: AC
  Filled 2021-10-23: qty 2

## 2021-10-23 MED ORDER — PHENYLEPHRINE HCL (PRESSORS) 10 MG/ML IV SOLN
INTRAVENOUS | Status: DC | PRN
  Administered 2021-10-23 (×2): 80 ug via INTRAVENOUS
  Administered 2021-10-23: 160 ug via INTRAVENOUS
  Administered 2021-10-23 (×4): 80 ug via INTRAVENOUS

## 2021-10-23 MED ORDER — FLEET ENEMA 7-19 GM/118ML RE ENEM: 1.0000 | ENEMA | Freq: Once | RECTAL | Status: DC | PRN

## 2021-10-23 MED ORDER — PHENYLEPHRINE HCL-NACL 20-0.9 MG/250ML-% IV SOLN
INTRAVENOUS | Status: AC
  Filled 2021-10-23: qty 250

## 2021-10-23 MED ORDER — DOCUSATE SODIUM 100 MG PO CAPS
100.0000 mg | ORAL_CAPSULE | Freq: Two times a day (BID) | ORAL | Status: DC
  Administered 2021-10-23 – 2021-10-30 (×14): 100 mg via ORAL
  Filled 2021-10-23 (×14): qty 1

## 2021-10-23 MED ORDER — METOPROLOL SUCCINATE ER 50 MG PO TB24: 25.0000 mg | ORAL_TABLET | Freq: Every day | ORAL | Status: DC

## 2021-10-23 MED ORDER — FENTANYL CITRATE PF 50 MCG/ML IJ SOSY
50.0000 ug | PREFILLED_SYRINGE | Freq: Once | INTRAMUSCULAR | Status: AC
  Administered 2021-10-23: 50 ug via INTRAVENOUS
  Filled 2021-10-23: qty 1

## 2021-10-23 MED ORDER — KETOROLAC TROMETHAMINE 15 MG/ML IJ SOLN
INTRAMUSCULAR | Status: AC
  Filled 2021-10-23: qty 1

## 2021-10-23 MED ORDER — ONDANSETRON HCL 4 MG/2ML IJ SOLN
4.0000 mg | Freq: Once | INTRAMUSCULAR | Status: AC | PRN
  Administered 2021-10-23: 4 mg via INTRAVENOUS

## 2021-10-23 MED ORDER — HYDROXYZINE HCL 25 MG PO TABS: 25.0000 mg | ORAL_TABLET | Freq: Every evening | ORAL | Status: DC | PRN

## 2021-10-23 MED ORDER — MEMANTINE HCL 5 MG PO TABS
10.0000 mg | ORAL_TABLET | Freq: Two times a day (BID) | ORAL | Status: DC
  Administered 2021-10-23 – 2021-10-30 (×14): 10 mg via ORAL
  Filled 2021-10-23 (×2): qty 1
  Filled 2021-10-23 (×3): qty 2
  Filled 2021-10-23 (×2): qty 1
  Filled 2021-10-23 (×5): qty 2
  Filled 2021-10-23 (×2): qty 1

## 2021-10-23 MED ORDER — LACTATED RINGERS IV SOLN: INTRAVENOUS | Status: DC | PRN

## 2021-10-23 MED ORDER — CEFAZOLIN SODIUM 1 G IJ SOLR
INTRAMUSCULAR | Status: AC
  Filled 2021-10-23: qty 10

## 2021-10-23 MED ORDER — ATORVASTATIN CALCIUM 20 MG PO TABS
80.0000 mg | ORAL_TABLET | Freq: Every evening | ORAL | Status: DC
  Administered 2021-10-24 – 2021-10-29 (×6): 80 mg via ORAL
  Filled 2021-10-23 (×6): qty 4

## 2021-10-23 MED ORDER — FENTANYL CITRATE PF 50 MCG/ML IJ SOSY: 50.0000 ug | PREFILLED_SYRINGE | Freq: Once | INTRAMUSCULAR | Status: DC

## 2021-10-23 MED ORDER — ROCURONIUM BROMIDE 100 MG/10ML IV SOLN
INTRAVENOUS | Status: DC | PRN
  Administered 2021-10-23: 40 mg via INTRAVENOUS

## 2021-10-23 MED ORDER — ALBUTEROL SULFATE (2.5 MG/3ML) 0.083% IN NEBU: 3.0000 mL | INHALATION_SOLUTION | Freq: Four times a day (QID) | RESPIRATORY_TRACT | Status: DC | PRN

## 2021-10-23 MED ORDER — ONDANSETRON HCL 4 MG/2ML IJ SOLN: 4.0000 mg | Freq: Four times a day (QID) | INTRAMUSCULAR | Status: DC | PRN

## 2021-10-23 MED ORDER — TIOTROPIUM BROMIDE MONOHYDRATE 18 MCG IN CAPS
18.0000 ug | ORAL_CAPSULE | Freq: Every day | RESPIRATORY_TRACT | Status: DC
  Administered 2021-10-25 – 2021-10-30 (×6): 18 ug via RESPIRATORY_TRACT
  Filled 2021-10-23: qty 5

## 2021-10-23 MED ORDER — METOCLOPRAMIDE HCL 5 MG/ML IJ SOLN
5.0000 mg | Freq: Three times a day (TID) | INTRAMUSCULAR | Status: DC | PRN
  Administered 2021-10-23: 10 mg via INTRAVENOUS
  Filled 2021-10-23: qty 2

## 2021-10-23 MED ORDER — EPHEDRINE SULFATE (PRESSORS) 50 MG/ML IJ SOLN
INTRAMUSCULAR | Status: DC | PRN
  Administered 2021-10-23 (×3): 5 mg via INTRAVENOUS

## 2021-10-23 MED ORDER — BUPIVACAINE LIPOSOME 1.3 % IJ SUSP
INTRAMUSCULAR | Status: DC | PRN
  Administered 2021-10-23: 10 mL

## 2021-10-23 MED ORDER — ACETAMINOPHEN 325 MG PO TABS: 325.0000 mg | ORAL_TABLET | Freq: Four times a day (QID) | ORAL | Status: DC | PRN

## 2021-10-23 MED ORDER — VENLAFAXINE HCL ER 75 MG PO CP24
75.0000 mg | ORAL_CAPSULE | Freq: Two times a day (BID) | ORAL | Status: DC
  Administered 2021-10-23 – 2021-10-30 (×14): 75 mg via ORAL
  Filled 2021-10-23 (×13): qty 1

## 2021-10-23 MED ORDER — KETOROLAC TROMETHAMINE 15 MG/ML IJ SOLN
7.5000 mg | Freq: Four times a day (QID) | INTRAMUSCULAR | Status: DC
  Administered 2021-10-23: 7.5 mg via INTRAVENOUS
  Filled 2021-10-23: qty 1

## 2021-10-23 MED ORDER — LIDOCAINE HCL (PF) 2 % IJ SOLN
INTRAMUSCULAR | Status: AC
  Filled 2021-10-23: qty 5

## 2021-10-23 MED ORDER — PROPOFOL 10 MG/ML IV BOLUS
INTRAVENOUS | Status: AC
  Filled 2021-10-23: qty 20

## 2021-10-23 MED ORDER — PHENYLEPHRINE HCL-NACL 20-0.9 MG/250ML-% IV SOLN: 0.0000 ug/min | INTRAVENOUS | Status: DC

## 2021-10-23 MED ORDER — ALLOPURINOL 100 MG PO TABS
100.0000 mg | ORAL_TABLET | Freq: Two times a day (BID) | ORAL | Status: DC
  Administered 2021-10-23 – 2021-10-30 (×14): 100 mg via ORAL
  Filled 2021-10-23 (×15): qty 1

## 2021-10-23 MED ORDER — SODIUM CHLORIDE 0.9 % IV SOLN: 250.0000 mL | INTRAVENOUS | Status: DC

## 2021-10-23 MED ORDER — HYDROCODONE-ACETAMINOPHEN 7.5-325 MG PO TABS
1.0000 | ORAL_TABLET | ORAL | Status: DC | PRN
  Administered 2021-10-23 – 2021-10-24 (×2): 1 via ORAL
  Administered 2021-10-24: 2 via ORAL
  Administered 2021-10-25 (×4): 1 via ORAL
  Administered 2021-10-26: 2 via ORAL
  Administered 2021-10-26: 1 via ORAL
  Administered 2021-10-26: 2 via ORAL
  Administered 2021-10-27: 1 via ORAL
  Administered 2021-10-27 – 2021-10-29 (×6): 2 via ORAL
  Filled 2021-10-23 (×2): qty 2
  Filled 2021-10-23 (×2): qty 1
  Filled 2021-10-23: qty 2
  Filled 2021-10-23 (×2): qty 1
  Filled 2021-10-23 (×3): qty 2
  Filled 2021-10-23: qty 1
  Filled 2021-10-23 (×3): qty 2
  Filled 2021-10-23 (×2): qty 1
  Filled 2021-10-23: qty 2
  Filled 2021-10-23 (×2): qty 1

## 2021-10-23 MED ORDER — LEVOTHYROXINE SODIUM 100 MCG PO TABS
100.0000 ug | ORAL_TABLET | Freq: Every day | ORAL | Status: DC
  Administered 2021-10-24 – 2021-10-30 (×7): 100 ug via ORAL
  Filled 2021-10-23 (×7): qty 1

## 2021-10-23 MED ORDER — DONEPEZIL HCL 5 MG PO TABS
10.0000 mg | ORAL_TABLET | Freq: Every day | ORAL | Status: DC
  Administered 2021-10-23 – 2021-10-29 (×7): 10 mg via ORAL
  Filled 2021-10-23 (×7): qty 2

## 2021-10-23 MED ORDER — FUROSEMIDE 10 MG/ML IJ SOLN
INTRAMUSCULAR | Status: AC
  Filled 2021-10-23: qty 2

## 2021-10-23 MED ORDER — BUPIVACAINE-EPINEPHRINE (PF) 0.5% -1:200000 IJ SOLN
INTRAMUSCULAR | Status: DC | PRN
  Administered 2021-10-23: 20 mL via PERINEURAL

## 2021-10-23 MED ORDER — OXYCODONE HCL 5 MG PO TABS: 5.0000 mg | ORAL_TABLET | Freq: Once | ORAL | Status: DC | PRN

## 2021-10-23 MED ORDER — ONDANSETRON HCL 4 MG/2ML IJ SOLN
4.0000 mg | Freq: Once | INTRAMUSCULAR | Status: AC
  Administered 2021-10-23: 4 mg via INTRAVENOUS
  Filled 2021-10-23: qty 2

## 2021-10-23 MED ORDER — DARBEPOETIN ALFA 100 MCG/0.5ML IJ SOSY
75.0000 ug | PREFILLED_SYRINGE | Freq: Once | INTRAMUSCULAR | Status: AC
  Administered 2021-10-23: 76 ug via SUBCUTANEOUS
  Filled 2021-10-23: qty 0.5

## 2021-10-23 MED ORDER — SODIUM CHLORIDE 0.9 % IV SOLN: 0.0000 ug/min | INTRAVENOUS | Status: DC

## 2021-10-23 MED ORDER — VASOPRESSIN 20 UNIT/ML IV SOLN
INTRAVENOUS | Status: AC
  Filled 2021-10-23: qty 1

## 2021-10-23 MED ORDER — BUPIVACAINE-EPINEPHRINE (PF) 0.5% -1:200000 IJ SOLN
INTRAMUSCULAR | Status: AC
  Filled 2021-10-23: qty 30

## 2021-10-23 MED ORDER — PHENYLEPHRINE HCL-NACL 20-0.9 MG/250ML-% IV SOLN
25.0000 ug/min | INTRAVENOUS | Status: DC
  Administered 2021-10-23: 80 ug/min via INTRAVENOUS
  Administered 2021-10-23: 25 ug/min via INTRAVENOUS
  Administered 2021-10-24 (×2): 70 ug/min via INTRAVENOUS
  Filled 2021-10-23 (×4): qty 250

## 2021-10-23 MED ORDER — FENTANYL CITRATE (PF) 100 MCG/2ML IJ SOLN
INTRAMUSCULAR | Status: AC
  Filled 2021-10-23: qty 2

## 2021-10-23 MED ORDER — HYDROMORPHONE HCL 1 MG/ML IJ SOLN
1.0000 mg | Freq: Once | INTRAMUSCULAR | Status: AC
  Administered 2021-10-23: 1 mg via INTRAVENOUS
  Filled 2021-10-23: qty 1

## 2021-10-23 MED ORDER — HYDROMORPHONE HCL 1 MG/ML IJ SOLN
0.5000 mg | Freq: Once | INTRAMUSCULAR | Status: AC
  Administered 2021-10-23: 0.5 mg via INTRAVENOUS
  Filled 2021-10-23: qty 0.5

## 2021-10-23 MED ORDER — ENOXAPARIN SODIUM 60 MG/0.6ML IJ SOSY
0.5000 mg/kg | PREFILLED_SYRINGE | INTRAMUSCULAR | Status: DC
  Administered 2021-10-24 – 2021-10-28 (×5): 47.5 mg via SUBCUTANEOUS
  Filled 2021-10-23 (×5): qty 0.6

## 2021-10-23 MED ORDER — LIDOCAINE HCL (CARDIAC) PF 100 MG/5ML IV SOSY
PREFILLED_SYRINGE | INTRAVENOUS | Status: DC | PRN
  Administered 2021-10-23: 100 mg via INTRAVENOUS

## 2021-10-23 MED ORDER — GUAIFENESIN ER 600 MG PO TB12
1200.0000 mg | ORAL_TABLET | Freq: Two times a day (BID) | ORAL | Status: DC
  Administered 2021-10-23 – 2021-10-30 (×14): 1200 mg via ORAL
  Filled 2021-10-23 (×14): qty 2

## 2021-10-23 MED ORDER — ACETAMINOPHEN 500 MG PO TABS
500.0000 mg | ORAL_TABLET | Freq: Four times a day (QID) | ORAL | Status: AC
  Administered 2021-10-23 – 2021-10-24 (×4): 500 mg via ORAL
  Filled 2021-10-23 (×4): qty 1

## 2021-10-23 MED ORDER — SODIUM CHLORIDE (PF) 0.9 % IJ SOLN
INTRAMUSCULAR | Status: AC
  Filled 2021-10-23: qty 20

## 2021-10-23 MED ORDER — PHENYLEPHRINE HCL-NACL 20-0.9 MG/250ML-% IV SOLN
INTRAVENOUS | Status: DC | PRN
  Administered 2021-10-23: 80 ug/min via INTRAVENOUS

## 2021-10-23 MED ORDER — BISACODYL 10 MG RE SUPP: 10.0000 mg | Freq: Every day | RECTAL | Status: DC | PRN

## 2021-10-23 MED ORDER — KETAMINE HCL 50 MG/5ML IJ SOSY
PREFILLED_SYRINGE | INTRAMUSCULAR | Status: AC
  Filled 2021-10-23: qty 5

## 2021-10-23 MED ORDER — ACETAMINOPHEN 10 MG/ML IV SOLN
INTRAVENOUS | Status: DC | PRN
  Administered 2021-10-23: 1000 mg via INTRAVENOUS

## 2021-10-23 MED ORDER — FUROSEMIDE 10 MG/ML IJ SOLN
20.0000 mg | Freq: Once | INTRAMUSCULAR | Status: AC
  Administered 2021-10-23: 20 mg via INTRAVENOUS

## 2021-10-23 MED ORDER — ROCURONIUM BROMIDE 10 MG/ML (PF) SYRINGE
PREFILLED_SYRINGE | INTRAVENOUS | Status: AC
  Filled 2021-10-23: qty 10

## 2021-10-23 MED ORDER — METHYLPREDNISOLONE SODIUM SUCC 125 MG IJ SOLR
80.0000 mg | INTRAMUSCULAR | Status: DC
  Administered 2021-10-24: 80 mg via INTRAVENOUS
  Filled 2021-10-23: qty 2

## 2021-10-23 MED ORDER — FENTANYL CITRATE (PF) 100 MCG/2ML IJ SOLN
25.0000 ug | INTRAMUSCULAR | Status: DC | PRN
  Administered 2021-10-23 (×2): 25 ug via INTRAVENOUS

## 2021-10-23 MED ORDER — KETAMINE HCL 10 MG/ML IJ SOLN
INTRAMUSCULAR | Status: DC | PRN
  Administered 2021-10-23: 30 mg via INTRAVENOUS

## 2021-10-23 MED ORDER — SENNOSIDES-DOCUSATE SODIUM 8.6-50 MG PO TABS: 1.0000 | ORAL_TABLET | Freq: Every evening | ORAL | Status: DC | PRN

## 2021-10-23 MED ORDER — ACETAMINOPHEN 10 MG/ML IV SOLN: 1000.0000 mg | Freq: Once | INTRAVENOUS | Status: DC | PRN

## 2021-10-23 MED ORDER — PROPOFOL 1000 MG/100ML IV EMUL
INTRAVENOUS | Status: AC
  Filled 2021-10-23: qty 100

## 2021-10-23 MED ORDER — CHLORHEXIDINE GLUCONATE CLOTH 2 % EX PADS
6.0000 | MEDICATED_PAD | Freq: Every day | CUTANEOUS | Status: DC
  Administered 2021-10-23 – 2021-10-30 (×6): 6 via TOPICAL

## 2021-10-23 MED ORDER — ONDANSETRON HCL 4 MG/2ML IJ SOLN
INTRAMUSCULAR | Status: DC | PRN
  Administered 2021-10-23: 4 mg via INTRAVENOUS

## 2021-10-23 MED ORDER — CEFAZOLIN SODIUM-DEXTROSE 2-3 GM-%(50ML) IV SOLR
INTRAVENOUS | Status: DC | PRN
  Administered 2021-10-23: 2 g via INTRAVENOUS

## 2021-10-23 MED ORDER — CHLORHEXIDINE GLUCONATE 0.12 % MT SOLN
OROMUCOSAL | Status: AC
  Filled 2021-10-23: qty 15

## 2021-10-23 MED ORDER — MIDAZOLAM HCL 2 MG/2ML IJ SOLN
INTRAMUSCULAR | Status: DC | PRN
  Administered 2021-10-23: 1 mg via INTRAVENOUS

## 2021-10-23 MED ORDER — ONDANSETRON HCL 4 MG PO TABS: 4.0000 mg | ORAL_TABLET | Freq: Four times a day (QID) | ORAL | Status: DC | PRN

## 2021-10-23 MED ORDER — DIGOXIN 125 MCG PO TABS
0.1250 mg | ORAL_TABLET | ORAL | Status: DC
  Administered 2021-10-24 – 2021-10-29 (×3): 0.125 mg via ORAL
  Filled 2021-10-23 (×3): qty 1

## 2021-10-23 MED ORDER — HYDROMORPHONE HCL 1 MG/ML IJ SOLN
0.5000 mg | INTRAMUSCULAR | Status: DC | PRN
  Administered 2021-10-23 (×2): 0.5 mg via INTRAVENOUS
  Filled 2021-10-23: qty 0.5
  Filled 2021-10-23: qty 1

## 2021-10-23 MED ORDER — EPHEDRINE 5 MG/ML INJ
INTRAVENOUS | Status: AC
  Filled 2021-10-23: qty 5

## 2021-10-23 MED ORDER — OXYCODONE HCL 5 MG/5ML PO SOLN: 5.0000 mg | Freq: Once | ORAL | Status: DC | PRN

## 2021-10-23 MED ORDER — FENTANYL CITRATE (PF) 100 MCG/2ML IJ SOLN
INTRAMUSCULAR | Status: DC | PRN
  Administered 2021-10-23: 50 ug via INTRAVENOUS

## 2021-10-23 MED ORDER — ONDANSETRON HCL 4 MG/2ML IJ SOLN
INTRAMUSCULAR | Status: AC
  Filled 2021-10-23: qty 2

## 2021-10-23 MED ORDER — MOMETASONE FURO-FORMOTEROL FUM 200-5 MCG/ACT IN AERO
2.0000 | INHALATION_SPRAY | Freq: Two times a day (BID) | RESPIRATORY_TRACT | Status: DC
  Administered 2021-10-24 – 2021-10-30 (×12): 2 via RESPIRATORY_TRACT
  Filled 2021-10-23: qty 8.8

## 2021-10-23 MED ORDER — GABAPENTIN 300 MG PO CAPS
300.0000 mg | ORAL_CAPSULE | Freq: Two times a day (BID) | ORAL | Status: DC
  Administered 2021-10-23 – 2021-10-25 (×4): 300 mg via ORAL
  Filled 2021-10-23 (×4): qty 1

## 2021-10-23 MED ORDER — SODIUM CHLORIDE 0.9% IV SOLUTION: Freq: Once | INTRAVENOUS | Status: AC

## 2021-10-23 MED ORDER — INSULIN ASPART 100 UNIT/ML IJ SOLN
0.0000 [IU] | Freq: Three times a day (TID) | INTRAMUSCULAR | Status: DC
  Administered 2021-10-24: 5 [IU] via SUBCUTANEOUS
  Administered 2021-10-24: 3 [IU] via SUBCUTANEOUS
  Administered 2021-10-25: 5 [IU] via SUBCUTANEOUS
  Administered 2021-10-26: 2 [IU] via SUBCUTANEOUS
  Administered 2021-10-26: 3 [IU] via SUBCUTANEOUS
  Administered 2021-10-27: 2 [IU] via SUBCUTANEOUS
  Administered 2021-10-27: 3 [IU] via SUBCUTANEOUS
  Administered 2021-10-28: 5 [IU] via SUBCUTANEOUS
  Administered 2021-10-28: 3 [IU] via SUBCUTANEOUS
  Administered 2021-10-29 (×2): 2 [IU] via SUBCUTANEOUS
  Filled 2021-10-23 (×12): qty 1

## 2021-10-23 MED ORDER — ACETAMINOPHEN 10 MG/ML IV SOLN
INTRAVENOUS | Status: AC
  Filled 2021-10-23: qty 100

## 2021-10-23 MED ORDER — SUGAMMADEX SODIUM 200 MG/2ML IV SOLN
INTRAVENOUS | Status: DC | PRN
  Administered 2021-10-23: 200 mg via INTRAVENOUS

## 2021-10-23 MED ORDER — MAGNESIUM SULFATE 4 GM/100ML IV SOLN
4.0000 g | Freq: Once | INTRAVENOUS | Status: AC
  Administered 2021-10-24: 4 g via INTRAVENOUS
  Filled 2021-10-23: qty 100

## 2021-10-23 MED ORDER — METOCLOPRAMIDE HCL 10 MG PO TABS: 5.0000 mg | ORAL_TABLET | Freq: Three times a day (TID) | ORAL | Status: DC | PRN

## 2021-10-23 MED ORDER — PROPOFOL 10 MG/ML IV BOLUS
INTRAVENOUS | Status: DC | PRN
  Administered 2021-10-23: 30 mg via INTRAVENOUS

## 2021-10-23 MED ORDER — BUPIVACAINE LIPOSOME 1.3 % IJ SUSP
INTRAMUSCULAR | Status: AC
  Filled 2021-10-23: qty 10

## 2021-10-23 MED ORDER — 0.9 % SODIUM CHLORIDE (POUR BTL) OPTIME
TOPICAL | Status: DC | PRN
  Administered 2021-10-23: 200 mL

## 2021-10-23 MED ORDER — DIPHENHYDRAMINE HCL 12.5 MG/5ML PO ELIX: 12.5000 mg | ORAL_SOLUTION | ORAL | Status: DC | PRN

## 2021-10-23 MED ORDER — HYDROCODONE-ACETAMINOPHEN 5-325 MG PO TABS: 1.0000 | ORAL_TABLET | Freq: Four times a day (QID) | ORAL | Status: DC | PRN

## 2021-10-23 MED ORDER — ALBUTEROL SULFATE HFA 108 (90 BASE) MCG/ACT IN AERS: 2.0000 | INHALATION_SPRAY | Freq: Four times a day (QID) | RESPIRATORY_TRACT | Status: DC | PRN

## 2021-10-23 MED ORDER — MAGNESIUM HYDROXIDE 400 MG/5ML PO SUSP
30.0000 mL | Freq: Every day | ORAL | Status: DC | PRN
  Administered 2021-10-27 – 2021-10-29 (×2): 30 mL via ORAL
  Filled 2021-10-23 (×2): qty 30

## 2021-10-23 SURGICAL SUPPLY — 76 items
APL PRP STRL LF DISP 70% ISPRP (MISCELLANEOUS) ×2
BAG DECANTER FOR FLEXI CONT (MISCELLANEOUS) IMPLANT
BIT DRILL CROWE POINT TWST 4.3 (DRILL) IMPLANT
BLADE SAGITTAL WIDE XTHICK NO (BLADE) ×1 IMPLANT
BLADE SAW SAG 25.4X90 (BLADE) ×1 IMPLANT
BLADE SURG SZ20 CARB STEEL (BLADE) ×1 IMPLANT
BNDG CMPR 5X4 CHSV STRCH STRL (GAUZE/BANDAGES/DRESSINGS) ×1
BNDG CMPR 5X6 CHSV STRCH STRL (GAUZE/BANDAGES/DRESSINGS) ×1
BNDG COHESIVE 4X5 TAN STRL LF (GAUZE/BANDAGES/DRESSINGS) IMPLANT
BNDG COHESIVE 6X5 TAN ST LF (GAUZE/BANDAGES/DRESSINGS) ×1 IMPLANT
BOWL CEMENT MIXING ADV NOZZLE (MISCELLANEOUS) IMPLANT
CHLORAPREP W/TINT 26 (MISCELLANEOUS) ×2 IMPLANT
DRAPE 3/4 80X56 (DRAPES) ×1 IMPLANT
DRAPE C-ARMOR (DRAPES) IMPLANT
DRAPE IMP U-DRAPE 54X76 (DRAPES) ×4 IMPLANT
DRAPE INCISE IOBAN 66X60 STRL (DRAPES) ×1 IMPLANT
DRAPE SURG 17X11 SM STRL (DRAPES) ×1 IMPLANT
DRAPE SURG 17X23 STRL (DRAPES) ×1 IMPLANT
DRILL CROWE POINT TWIST 4.3 (DRILL) ×1
DRSG OPSITE POSTOP 4X12 (GAUZE/BANDAGES/DRESSINGS) ×1 IMPLANT
DRSG OPSITE POSTOP 4X14 (GAUZE/BANDAGES/DRESSINGS) IMPLANT
DRSG OPSITE POSTOP 4X8 (GAUZE/BANDAGES/DRESSINGS) ×1 IMPLANT
ELECT CAUTERY BLADE 6.4 (BLADE) ×2 IMPLANT
ELECT REM PT RETURN 9FT ADLT (ELECTROSURGICAL) ×1
ELECTRODE REM PT RTRN 9FT ADLT (ELECTROSURGICAL) ×1 IMPLANT
GAUZE 4X4 16PLY ~~LOC~~+RFID DBL (SPONGE) ×2 IMPLANT
GAUZE PACK 2X3YD (PACKING) IMPLANT
GLOVE BIO SURGEON STRL SZ8 (GLOVE) ×3 IMPLANT
GLOVE BIOGEL M STRL SZ7.5 (GLOVE) IMPLANT
GLOVE BIOGEL PI IND STRL 8 (GLOVE) IMPLANT
GLOVE SURG UNDER LTX SZ8 (GLOVE) ×2 IMPLANT
GOWN STRL REUS W/ TWL LRG LVL3 (GOWN DISPOSABLE) ×1 IMPLANT
GOWN STRL REUS W/ TWL XL LVL3 (GOWN DISPOSABLE) ×1 IMPLANT
GOWN STRL REUS W/TWL LRG LVL3 (GOWN DISPOSABLE) ×1
GOWN STRL REUS W/TWL XL LVL3 (GOWN DISPOSABLE) ×1
GUIDEPIN VERSANAIL DSP 3.2X444 (ORTHOPEDIC DISPOSABLE SUPPLIES) IMPLANT
GUIDEWIRE BALL NOSE 100CM (WIRE) IMPLANT
HFN RH 130 DEG 11MM X 360MM (Orthopedic Implant) IMPLANT
HOLSTER ELECTROSUGICAL PENCIL (MISCELLANEOUS) ×1 IMPLANT
HOOD PEEL AWAY FLYTE STAYCOOL (MISCELLANEOUS) ×1 IMPLANT
IV NS 100ML SINGLE PACK (IV SOLUTION) IMPLANT
IV NS IRRIG 3000ML ARTHROMATIC (IV SOLUTION) ×2 IMPLANT
KIT PREVENA INCISION MGT 13 (CANNISTER) IMPLANT
LABEL OR SOLS (LABEL) ×1 IMPLANT
MANIFOLD NEPTUNE II (INSTRUMENTS) ×1 IMPLANT
NDL FILTER BLUNT 18X1 1/2 (NEEDLE) ×1 IMPLANT
NDL SAFETY ECLIP 18X1.5 (MISCELLANEOUS) ×1 IMPLANT
NDL SPNL 20GX3.5 QUINCKE YW (NEEDLE) ×2 IMPLANT
NEEDLE FILTER BLUNT 18X1 1/2 (NEEDLE) ×1 IMPLANT
NEEDLE SPNL 20GX3.5 QUINCKE YW (NEEDLE) ×1 IMPLANT
NS IRRIG 1000ML POUR BTL (IV SOLUTION) ×1 IMPLANT
PACK HIP PROSTHESIS (MISCELLANEOUS) ×1 IMPLANT
PULSAVAC PLUS IRRIG FAN TIP (DISPOSABLE)
SCREW ANTI-ROTATION 75MM (Screw) IMPLANT
SCREW BONE CORTICAL 5.0X42 (Screw) IMPLANT
SCREW DRILL BIT ANIT ROTATION (BIT) IMPLANT
SCREW LAG HIP NAIL 10.5X95 (Screw) IMPLANT
SPIKE FLUID TRANSFER (MISCELLANEOUS) ×2 IMPLANT
SPONGE T-LAP 18X18 ~~LOC~~+RFID (SPONGE) ×4 IMPLANT
STAPLER SKIN PROX 35W (STAPLE) ×1 IMPLANT
STRAP SAFETY 5IN WIDE (MISCELLANEOUS) ×1 IMPLANT
SUT TICRON 2-0 30IN 311381 (SUTURE) ×1 IMPLANT
SUT VIC AB 1 CT1 36 (SUTURE) IMPLANT
SUT VIC AB 2-0 CT1 (SUTURE) ×2 IMPLANT
SUT VIC AB 2-0 CT1 27 (SUTURE) ×1
SUT VIC AB 2-0 CT1 TAPERPNT 27 (SUTURE) IMPLANT
SUT VICRYL 1-0 27IN ABS (SUTURE)
SUTURE VICRYL 1-0 27IN ABS (SUTURE) ×2 IMPLANT
SYR 10ML LL (SYRINGE) ×1 IMPLANT
SYR 30ML LL (SYRINGE) ×3 IMPLANT
SYR TB 1ML 27GX1/2 LL (SYRINGE) IMPLANT
TAPE TRANSPORE STRL 2 31045 (GAUZE/BANDAGES/DRESSINGS) ×1 IMPLANT
TIP BRUSH PULSAVAC PLUS 24.33 (MISCELLANEOUS) IMPLANT
TIP FAN IRRIG PULSAVAC PLUS (DISPOSABLE) ×1 IMPLANT
TRAP FLUID SMOKE EVACUATOR (MISCELLANEOUS) ×2 IMPLANT
WATER STERILE IRR 500ML POUR (IV SOLUTION) ×1 IMPLANT

## 2021-10-23 NOTE — ED Triage Notes (Addendum)
Tripped and fell while using the bathroom. Hit right foot and leg on bath tub. No obvious deformity to right leg or hip noted. Bruise and possible deformity to right foot. No LOC noted, no vomiting

## 2021-10-23 NOTE — H&P (Signed)
History and Physical    [name] OZH:[id number] DOB: 01-07-1951 DOA: 10/23/2021  PCP: Cyndi Bender, PA-C (Confirm with patient/family/NH records and if not entered, this has to be entered at [facility] point of entry) Patient coming from: Home  I have personally briefly reviewed patient's old medical records in [place]  Chief Complaint: I fell  HPI: Stacy Grimes is a 71 y.o. female with medical history significant of nonischemic cardiomyopathy with our UF 20-25%, status post AICD, COPD with chronic hypoxic respite failure on 3-4 L continuously at home, CVA, HTN, AAA, chronic normocytic anemia, presented with mechanical fall and right hip pain.  Appears that patient has a chronic right hip/knee pain, and occasionally she has had episodes of " right leg give up" with near fall and falls.  She had at least 2-3 episode before today.  This morning, patient went to the bathroom, while trying to stand up from toilet bowl, she again felt right leg weakness and fell on the right side and hit her hip.  She denies any prodrome of lightheadedness chest pain dizziness shortness of breath.  She has a chronic O2 dependent on 3 to 4 L, but over the weekend, patient developed a new nonproductive cough and wheezing, family has turned oxygen to 5 L yesterday.  Denies any chest pain no fever chills.  ED Course: O2 saturation stabilized on 6 L, chest x-ray clean no acute infiltrates.  CT pelvis showed right femoral comminuted fracture on proximal femur with intertrochanteric and basicervical components.  ABG 7.37/66/60, bicarb 34.  Review of Systems: As per HPI otherwise 14 point review of systems negative.    Past Medical History:  Diagnosis Date   AAA (abdominal aortic aneurysm) (HCC)    Anginal pain ([place])    Anxiety    Arthritis    Automatic implantable cardioverter-defibrillator in situ    Biventricular ICD  [place]    COPD (chronic obstructive pulmonary disease) (HCC)    Deafness     left ear   Dementia (HCC)    Depression    Dizziness    Dysrhythmia    Fracture    history of spinal fracture   GERD (gastroesophageal reflux disease)    HFrEF (heart failure with reduced ejection fraction) ([place])    a. 2010 Cath: nonobs dzs, EF 15-20%; b. 09/2017 Echo: EF suboptimal. EF low nl; c. 04/2018 Echo: EF 40-45%.   Hypertension    Hypothyroidism    Non-ischemic cardiomyopathy ([place])    a. 2010 Cath: nonobs dzs, EF 15-20%; b. 2011 s/p SJM CRT-D; c. 09/2016 s/p Gen change; d. 09/2017 Echo: EF suboptimal. EF low nl; e. 04/2018 Echo: EF 40-45%.   Oxygen dependent    Palpitations    Shortness of breath dyspnea    Sleep apnea    Wheezing     Past Surgical History:  Procedure Laterality Date   ABDOMINAL HYSTERECTOMY     BIV ICD GENERATOR CHANGEOUT N/A 09/17/2016   Procedure: BiV ICD Generator Changeout;  Surgeon: Deboraha Sprang, MD;  Location: [facility];  Service: Cardiovascular;  Laterality: N/A;   BLADDER SURGERY     CARDIAC CATHETERIZATION     CARDIAC DEFIBRILLATOR PLACEMENT  4 yrs ago   pacemaker   CATARACT EXTRACTION W/PHACO Left 09/01/2014   Procedure: CATARACT EXTRACTION PHACO AND INTRAOCULAR LENS PLACEMENT ([place]);  Surgeon: Lyla Glassing, MD;  Location: [facility];  Service: Ophthalmology;  Laterality: Left;  [place]: 1:12.1    CATARACT EXTRACTION W/PHACO Right  09/29/2014   Procedure: CATARACT EXTRACTION PHACO AND INTRAOCULAR LENS PLACEMENT (IOC);  Surgeon: Lyla Glassing, MD;  Location: [facility];  Service: Ophthalmology;  Laterality: Right;  [place]: 00:52.7    CHOLECYSTECTOMY     COLON SURGERY     COLONOSCOPY     COLONOSCOPY N/A 08/31/2012   Procedure: COLONOSCOPY;  Surgeon: Inda Castle, MD;  Location: [facility];  Service: Endoscopy;  Laterality: N/A;   ESOPHAGOGASTRODUODENOSCOPY N/A 07/06/2012   Procedure: ESOPHAGOGASTRODUODENOSCOPY (EGD);  Surgeon: Lafayette Dragon, MD;  Location: [facility];  Service: Endoscopy;  Laterality: N/A;   KNEE SURGERY Right    MIDDLE EAR  SURGERY     SAVORY DILATION N/A 07/06/2012   Procedure: SAVORY DILATION;  Surgeon: Lafayette Dragon, MD;  Location: [facility];  Service: Endoscopy;  Laterality: N/A;   TEE WITHOUT CARDIOVERSION N/A 06/23/2020   Procedure: TRANSESOPHAGEAL ECHOCARDIOGRAM (TEE);  Surgeon: Minna Merritts, MD;  Location: [facility];  Service: Cardiovascular;  Laterality: N/A;   WRIST SURGERY Right      reports that she quit smoking about 19 years ago. Her smoking use included cigarettes. She has a 60.00 pack-year smoking history. She has never used smokeless tobacco. She reports that she does not drink alcohol and does not use drugs.  Allergies  Allergen Reactions   Codeine Palpitations   Contrast Media [Iodinated Contrast Media] Rash    Family History  Problem Relation Age of Onset   Hypertension Mother    Breast cancer Mother    Stroke Mother    Heart disease Father    Heart attack Sister    Hypertension Brother    Heart attack Sister    Colon cancer Brother 22     Prior to Admission medications   Medication Sig Start Date End Date Taking? Authorizing Provider  allopurinol (ZYLOPRIM) 100 MG tablet Take 300 mg by mouth daily.   Yes [provider]  atorvastatin (LIPITOR) 80 MG tablet Take 1 tablet (80 mg total) by mouth daily. 07/12/21 07/07/22 Yes Wellington Hampshire, MD  clopidogrel (PLAVIX) 75 MG tablet Take 1 tablet (75 mg total) by mouth daily. 07/12/21  Yes Wellington Hampshire, MD  digoxin (LANOXIN) 0.125 MG tablet TAKE 1 TABLET BY MOUTH ON MONDAY, [facility] AND FRIDAY 10/10/21  Yes Wellington Hampshire, MD  donepezil (ARICEPT) 10 MG tablet Take 10 mg by mouth at bedtime.   Yes [provider]  furosemide (LASIX) 20 MG tablet Take 1 tablet (20 mg total) by mouth every other day. May take an additional tablet AS NEEDED for weight gain of 3 lbs overnight or 5 lbs in one week. Do not exceed more than 3 additional doses. - Oral 10/10/21  Yes Wellington Hampshire, MD  gabapentin (NEURONTIN) 300 MG  capsule Take 300 mg by mouth 2 (two) times daily as needed (RLS symptoms/ pain).    Yes [provider]  hydrOXYzine (VISTARIL) 25 MG capsule Take 25 mg by mouth at bedtime as needed for anxiety.   Yes [provider]  levothyroxine (SYNTHROID) 112 MCG tablet Take 112 mcg by mouth daily before breakfast.   Yes [provider]  losartan (COZAAR) 25 MG tablet Take 0.5 tablets (12.5 mg total) by mouth daily. 07/12/21  Yes Wellington Hampshire, MD  memantine (NAMENDA) 10 MG tablet Take 10 mg by mouth in the morning and at bedtime. 07/10/21  Yes [provider]  metFORMIN (GLUCOPHAGE-XR) 500 MG 24 hr tablet Take 500 mg by mouth 2 (two) times  daily with a meal.    Yes [provider]  metoprolol succinate (TOPROL-XL) 25 MG 24 hr tablet Take 25 mg by mouth daily.   Yes [provider]  omeprazole (PRILOSEC) 20 MG capsule Take 1 capsule (20 mg total) by mouth every morning. 04/12/18  Yes Gladstone Lighter, MD  potassium chloride SA (KLOR-CON M) 20 MEQ tablet Take 1 tablet (20 mEq total) by mouth daily. 07/12/21  Yes Wellington Hampshire, MD  venlafaxine XR (EFFEXOR-XR) 75 MG 24 hr capsule Take 75 mg by mouth 2 (two) times daily. 10/21/21  Yes [provider]  vitamin B-12 (CYANOCOBALAMIN) 1000 MCG tablet Take 1,000 mcg by mouth 2 (two) times daily.    Yes [provider]  albuterol (ACCUNEB) 0.63 MG/3ML nebulizer solution Take 1 ampule by nebulization every 6 (six) hours as needed for wheezing.    [provider]  albuterol (PROVENTIL HFA;VENTOLIN HFA) 108 (90 Base) MCG/ACT inhaler Inhale 2 puffs into the lungs every 6 (six) hours as needed. 11/02/16   Orbie Pyo, MD  budesonide-formoterol (SYMBICORT) 160-4.5 MCG/ACT inhaler Inhale 2 puffs into the lungs 2 (two) times daily.    [provider]  Menthol, Topical Analgesic, (BENGAY EX) Apply 1 application topically daily as needed (back pain).    [provider]   mirtazapine (REMERON) 7.5 MG tablet Take 7.5 mg by mouth 2 (two) times daily. 10/10/21   [provider]  Sennosides (EX-LAX PO) Take 2 tablets by mouth at bedtime as needed (constipation).    [provider]  tiotropium (SPIRIVA) 18 MCG inhalation capsule Place 1 capsule (18 mcg total) into inhaler and inhale daily as needed (shortness of breath). 04/12/18   Gladstone Lighter, MD    Physical Exam: Vitals:   10/23/21 1000 10/23/21 1002 10/23/21 1100 10/23/21 1204  BP: 128/65  129/65 126/68  Pulse: 98  94 89  Resp: 20  12 16   Temp:    (!) 97.1 F (36.2 C)  TempSrc:    Temporal  SpO2: (!) 88%  90% 100%  Weight:  95.3 kg      Constitutional: NAD, calm, comfortable Vitals:   10/23/21 1000 10/23/21 1002 10/23/21 1100 10/23/21 1204  BP: 128/65  129/65 126/68  Pulse: 98  94 89  Resp: 20  12 16   Temp:    (!) 97.1 F (36.2 C)  TempSrc:    Temporal  SpO2: (!) 88%  90% 100%  Weight:  95.3 kg     Eyes: PERRL, lids and conjunctivae normal ENMT: Mucous membranes are moist. Posterior pharynx clear of any exudate or lesions.Normal dentition.  Neck: normal, supple, no masses, no thyromegaly Respiratory: clear to auscultation bilaterally, scattered wheezing, no crackles.  Increasing respiratory effort. No accessory muscle use.  Cardiovascular: Regular rate and rhythm, no murmurs / rubs / gallops. No extremity edema. 2+ pedal pulses. No carotid bruits.  Abdomen: no tenderness, no masses palpated. No hepatosplenomegaly. Bowel sounds positive.  Musculoskeletal: no clubbing / cyanosis.  Right leg rotated and shortened Skin: no rashes, lesions, ulcers. No induration Neurologic: CN 2-12 grossly intact. Sensation intact, DTR normal. Strength 5/5 in all 4.  Psychiatric: Normal judgment and insight. Alert and oriented x 3. Normal mood.     Labs on Admission: I have personally reviewed following labs and imaging studies  CBC: Recent Labs  Lab 10/23/21 0451  WBC 8.4  HGB 9.1*   HCT 32.0*  MCV 79.2*  PLT 650   Basic Metabolic Panel: Recent Labs  Lab  10/23/21 0451  NA 145  K 4.3  CL 103  CO2 34*  GLUCOSE 119*  BUN 13  CREATININE 0.88  CALCIUM 7.9*   GFR: Estimated Creatinine Clearance: 63.5 mL/min (by C-G formula based on SCr of 0.88 mg/dL). Liver Function Tests: No results for input(s): "AST", "ALT", "ALKPHOS", "BILITOT", "PROT", "ALBUMIN" in the last 168 hours. No results for input(s): "LIPASE", "AMYLASE" in the last 168 hours. No results for input(s): "AMMONIA" in the last 168 hours. Coagulation Profile: No results for input(s): "INR", "PROTIME" in the last 168 hours. Cardiac Enzymes: No results for input(s): "CKTOTAL", "CKMB", "CKMBINDEX", "TROPONINI" in the last 168 hours. BNP (last 3 results) No results for input(s): "PROBNP" in the last 8760 hours. HbA1C: No results for input(s): "HGBA1C" in the last 72 hours. CBG: No results for input(s): "GLUCAP" in the last 168 hours. Lipid Profile: No results for input(s): "CHOL", "HDL", "LDLCALC", "TRIG", "CHOLHDL", "LDLDIRECT" in the last 72 hours. Thyroid Function Tests: No results for input(s): "TSH", "T4TOTAL", "FREET4", "T3FREE", "THYROIDAB" in the last 72 hours. Anemia Panel: Recent Labs    10/23/21 0451  FERRITIN 33  TIBC 356  IRON 98  RETICCTPCT 0.8   Urine analysis:    Component Value Date/Time   COLORURINE YELLOW (A) 12/15/2019 0905   APPEARANCEUR CLOUDY (A) 12/15/2019 0905   APPEARANCEUR Clear 07/31/2013 1658   LABSPEC 1.023 12/15/2019 0905   LABSPEC 1.018 07/31/2013 1658   PHURINE 5.0 12/15/2019 0905   GLUCOSEU 50 (A) 12/15/2019 0905   GLUCOSEU Negative 07/31/2013 1658   HGBUR NEGATIVE 12/15/2019 0905   BILIRUBINUR NEGATIVE 12/15/2019 0905   BILIRUBINUR Negative 07/31/2013 1658   KETONESUR NEGATIVE 12/15/2019 0905   PROTEINUR 100 (A) 12/15/2019 0905   UROBILINOGEN 0.2 03/28/2012 1526   NITRITE NEGATIVE 12/15/2019 0905   LEUKOCYTESUR NEGATIVE 12/15/2019 0905    LEUKOCYTESUR Negative 07/31/2013 1658    Radiological Exams on Admission: CT Hip Right Wo Contrast  Result Date: 10/23/2021 CLINICAL DATA:  Right hip fracture EXAM: CT OF THE RIGHT HIP WITHOUT CONTRAST TECHNIQUE: Multidetector CT imaging of the right hip was performed according to the standard protocol. Multiplanar CT image reconstructions were also generated. RADIATION DOSE REDUCTION: This exam was performed according to the departmental dose-optimization program which includes automated exposure control, adjustment of the mA and/or kV according to patient size and/or use of iterative reconstruction technique. COMPARISON:  10/23/2021 FINDINGS: Bones/Joint/Cartilage Acute comminuted fracture of the proximal right femur with intertrochanteric and basicervical components. Moderate shortening with mild varus angulation. There are a few mildly displaced fracture fragments including dominant 2.3 x 0.6 cm fragment along the posterior aspect of the femoral neck. Femoroacetabular joint alignment is maintained without dislocation. Mild hip joint space narrowing. The visualized portion of the right hemipelvis is intact. Ligaments Suboptimally assessed by CT. Muscles and Tendons No acute musculotendinous abnormality by CT. Soft tissues No fluid collection or hematoma.  No right inguinal lymphadenopathy. IMPRESSION: Acute comminuted fracture of the proximal right femur with intertrochanteric and basicervical components. Electronically Signed   By: Davina Poke D.O.   On: 10/23/2021 09:48   DG Chest Port 1 View  Result Date: 10/23/2021 CLINICAL DATA:  [place].  Right hip fracture. EXAM: PORTABLE CHEST 1 VIEW COMPARISON:  12/15/2019 FINDINGS: The pacer wires/AICD are stable. Stable cardiac enlargement. Stable mild tortuosity and calcification of the thoracic aorta. The lungs are clear. No pleural effusion or pneumothorax. The bony thorax is intact. IMPRESSION: No acute cardiopulmonary findings. Electronically Signed    By: Ricky Stabs.D.  On: 10/23/2021 08:09   DG Foot Complete Right  Result Date: 10/23/2021 CLINICAL DATA:  Tripped and fell.  Right foot pain. EXAM: RIGHT FOOT COMPLETE - 3+ VIEW COMPARISON:  None Available. FINDINGS: The joint spaces are fairly well maintained. Minimal degenerative changes. No acute fracture is identified. IMPRESSION: No acute bony findings. Electronically Signed   By: Marijo Sanes M.D.   On: 10/23/2021 08:03   DG Hip Unilat With Pelvis 2-3 Views Right  Result Date: 10/23/2021 CLINICAL DATA:  [place].  Right hip pain. EXAM: DG HIP (WITH OR WITHOUT PELVIS) 2-3V RIGHT COMPARISON:  None Available. FINDINGS: There is a displaced low femoral neck fracture with mild comminution. The left hip is intact. The pubic symphysis and SI joints are intact. No pelvic fractures are identified. IMPRESSION: Displaced low femoral neck fracture. Electronically Signed   By: Marijo Sanes M.D.   On: 10/23/2021 08:01    EKG: Ordered  Assessment/Plan Principal Problem:   Hip fracture (HCC) Active Problems:   Hip fracture, unspecified laterality, closed, initial encounter ([place])  (please populate well all problems here in Problem List. (For example, if patient is on BP meds at home and you resume or decide to hold them, it is a problem that needs to be her. Same for CAD, COPD, HLD and so on)  Acute COPD exacerbation -Active wheezing cough, increasing oxygen demand -No clearly infiltrates or consolidation on chest x-ray afebrile, no leukocytosis -IV Solu-Medrol, continue LABA, Spiriva, inhaled steroid and DuoNebs -Incentive spirometry and flutter valve -At increased risk of postoperation respiratory failure, may need to bridging with BiPAP after surgery, monitor volume status.  Chronic HFrEF -Euvolemic -Continue home CHF medications, no indication for Lasix at this point. -We will need to closely monitor volume status during and after surgery -Check chest x-ray after ORIF  Right proximal  femoral comminuted fracture -Calculated 30 days major cardiac risk 10%, medically cleared for ORIF with general anesthesia with acceptable risk. -Pain control, ORIF.  PAF -On sinus rhythm, continue digoxin Monday Wednesday Friday -Continue metoprolol  IIDM -Switch to sliding scale for now hold metformin  Diabetic neuropathy -Continue gabapentin  Hx of CVA -Continue aspirin, restart Plavix if okay with orthopedic surgery tomorrow  Hypothyroidism, early dementia -Stable  DVT prophylaxis: Lovenox Code Status: Full code Family Communication: Husband at bedside Disposition Plan: Patient sick with COPD Consults called: Orthopedic surgery Admission status: Tele admit   Lequita Halt MD [facility] Pager [phone]  10/23/2021, 1:01 PM

## 2021-10-23 NOTE — ED Notes (Addendum)
Pt transported to xray. Pt brief clean and dry at this time. Pt repositioned on side per request and pillows placed under backside.

## 2021-10-23 NOTE — Anesthesia Procedure Notes (Signed)
Procedure Name: Intubation Date/Time: 10/23/2021 1:40 PM  Performed by: Cammie Sickle, CRNAPre-anesthesia Checklist: Patient identified, Patient being monitored, Timeout performed, Emergency Drugs available and Suction available Patient Re-evaluated:Patient Re-evaluated prior to induction Oxygen Delivery Method: Circle system utilized Preoxygenation: Pre-oxygenation with 100% oxygen Induction Type: IV induction Ventilation: Mask ventilation without difficulty Laryngoscope Size: 3 and McGraph Grade View: Grade I Tube type: Oral Tube size: 6.5 mm Number of attempts: 1 Airway Equipment and Method: Stylet Placement Confirmation: ETT inserted through vocal cords under direct vision, positive ETCO2 and breath sounds checked- equal and bilateral Secured at: 21 cm Tube secured with: Tape Dental Injury: Teeth and Oropharynx as per pre-operative assessment  Comments: Intubated in pt's bed for her comfort.

## 2021-10-23 NOTE — Consult Note (Incomplete)
NAME:  Stacy Grimes, MRN:  [id number], DOB:  October 12, 1950, LOS: 0 ADMISSION DATE:  10/23/2021, CONSULTATION DATE:  10/23/2021 REFERRING MD:  Dr. Roosevelt Locks, CHIEF COMPLAINT:  Hypotension   Brief Pt Description / Synopsis:  71 year old female with past medical history most notable for ischemic cardiomyopathy with EF 20 to 25% and COPD requiring 3 to 4 L baseline supplemental oxygen at home, admitted with Closed displaced comminuted intertrochanteric/basicervical fracture of right hip status post fall at home.  Underwent reduction and internal fixation of displaced right hip fracture, postop with hypotension and hypoxia.  History of Present Illness:  Stacy Grimes is a 71 year old female with a past medical history significant for nonischemic cardiomyopathy with last EF of 20 to 25% status post AICD, COPD with chronic hypoxic respiratory failure requiring 3 to 4 L supplemental O2 at home, CVA, hypertension, AAA, chronic normocytic anemia who presented to [facility] ED on 10/23/2021 status post fall with associated right hip pain.  She reported she occasionally has episodes where she feels her right leg gives up with near falls.  Earlier this morning when she went to the bathroom while trying to stand up from the toilet bowl she developed right leg weakness and fell on her right side hitting her hip.  She denied lightheadedness, dizziness, chest pain, shortness of breath.  She did report that over the weekend she developed a new nonproductive cough and wheezing with slight increase in oxygen requirements up to 5 L.  She denied chest pain, palpitations, shortness of breath, fever, chills, abdominal pain, nausea, vomiting, dysuria.  ED Course: Initial Vital Signs: Temperature 97.8 F orally, respiratory rate 21, pulse 89, blood pressure 127/66, SPO2 91% on 3 L nasal cannula Significant Labs: Bicarbonate 34, glucose 119, high-sensitivity troponin 8, WBC 8.4, hemoglobin 9.1, hematocrit 32, MCV 79.2, MCH 22.5,  hemoglobin A1c 7.0 ABG: pH 7.37/PCO2 66/PO2 60/bicarb 38.2 Imaging Chest X-ray>>IMPRESSION: No acute cardiopulmonary findings. X-ray right foot>>IMPRESSION: No acute bony findings CT right hip>>IMPRESSION: Acute comminuted fracture of the proximal right femur with intertrochanteric and basicervical components.  Hospitalist were asked to admit the patient for further work-up and treatment.  Orthopedics was consulted, with plans to go to the OR for ORIF.  Please see "significant hospital events" section below for full detailed hospital course.  Pertinent  Medical History   Past Medical History:  Diagnosis Date   AAA (abdominal aortic aneurysm) (HCC)    Anginal pain ([place])    Anxiety    Arthritis    Automatic implantable cardioverter-defibrillator in situ    Biventricular ICD  [employer]    COPD (chronic obstructive pulmonary disease) ([place])    Deafness    left ear   Dementia (HCC)    Depression    Dizziness    Dysrhythmia    Fracture    history of spinal fracture   GERD (gastroesophageal reflux disease)    HFrEF (heart failure with reduced ejection fraction) ([place])    a. 2010 Cath: nonobs dzs, EF 15-20%; b. 09/2017 Echo: EF suboptimal. EF low nl; c. 04/2018 Echo: EF 40-45%.   Hypertension    Hypothyroidism    Non-ischemic cardiomyopathy ([place])    a. 2010 Cath: nonobs dzs, EF 15-20%; b. 2011 s/p SJM CRT-D; c. 09/2016 s/p Gen change; d. 09/2017 Echo: EF suboptimal. EF low nl; e. 04/2018 Echo: EF 40-45%.   Oxygen dependent    Palpitations    Shortness of breath dyspnea    Sleep apnea    Wheezing    Micro  Data:  N/A  Antimicrobials:  Cefazolin 9/19 (surgical prophylaxis)>>  Significant Hospital Events: Including procedures, antibiotic start and stop dates in addition to other pertinent events   9/19: Admitted by hospitalist for right hip fracture.  Ortho consulted, taken to the OR for ORIF. 9/19: Intra-Op with hypotension, estimated blood loss 200 cc requiring phenylephrine.   Postop with hypoxia, chest x-ray concerning for pulmonary edema.  Transferred to stepdown, PCCM consulted for assistance with hypotension.  Interim History / Subjective:  -Patient underwent reduction and internal fixation of displaced right hip fracture  -Intra-Op was noted to be hypotensive requiring Neo-Synephrine, estimated blood loss is 200 cc -In PACU remained hypotensive, along with hypoxia and chest x-ray concerning for pulmonary edema ~was given 20 mg IV Lasix -PCCM asked to consult for assistance with management of hypertension -Upon arrival to stepdown, patient is awake and alert, does report mild dizziness -Denies chest pain, shortness of breath, wheezing, cough, abdominal pain, nausea, vomiting, fever  Objective   Blood pressure (!) 82/56, pulse 99, temperature 97.6 F (36.4 C), resp. rate (!) 26, weight 95.3 kg, SpO2 (!) 89 %.        Intake/Output Summary (Last 24 hours) at 10/23/2021 1822 Last data filed at 10/23/2021 1539 Gross per 24 hour  Intake 800 ml  Output 200 ml  Net 600 ml   Filed Weights   10/23/21 1002  Weight: 95.3 kg    Examination: General: Acute on chronically ill-appearing female, sitting in bed, on 5 L nasal cannula, complains of right hip pain with movement, but no acute distress HENT: Atraumatic, normocephalic, neck supple, difficult to assess JVD due to body habitus Lungs: Clear breath sounds bilaterally, even, nonlabored Cardiovascular: Paced rhythm, no murmurs, rubs, gallops Abdomen: Obese, soft, nontender, nondistended, no guarding rebound tenderness, bowel sounds positive x4 Extremities: Right hip with honeycomb dressing clean dry and intact, trace edema bilateral lower extremities Neuro: Awake and alert, oriented x2, very hard of hearing, moves all extremities to command, no focal deficits noted, speech clear GU: External female catheter in place  Resolved Hospital Problem list     Assessment & Plan:   #Hypotension: Suspect hypovolemia  due to acute blood loss Intra-Op +/- Cardiogenic +/- related to pain medication/anesthesia #Acute on chronic HFrEF PMHx: Ischemic cardiomyopathy with EF 20 to 25% s/p AICD, paroxysmal atrial fibrillation, HTN, AAA TEE from 09/21/20 with LVEF 20/25%, moderately reduced RV function -Continuous cardiac monitoring -Maintain MAP >65 -Cautious IV fluids -Transfusions as indicated -Vasopressors as needed to maintain MAP goal -Trend lactic acid until normalized -Trend HS Troponin until peaked -Check cortisol -Repeat echocardiogram  -Diuresis as BP and renal function permits  #Acute on chronic hypoxic and hypercapnic respiratory failure in the setting of acute decompensated HFrEF PMHx: COPD requiring 3 to 4 L supplemental oxygen at baseline, OSA -Supplemental O2 as needed to maintain O2 sats 88 to 92% -BiPAP if needed -Follow intermittent Chest X-ray & ABG as needed -Bronchodilators an needed; continue home [place] -IV Steroids -Diuresis as BP and renal function permits ~ received 20 mg IV Lasix 9/19 -Pulmonary toilet as able  #Closed displaced comminuted intertrochanteric/basicervical fracture of right hip. S/p ORIF on 9/19 -Orthopedics following, appreciate input  Anemia of chronic disease superimposed on Acute blood loss anemia intra-op -Monitor for S/Sx of bleeding -Trend CBC -SCD's for VTE Prophylaxis (to resume Lovenox on 9/20) -Transfuse for Hgb <8  Diabetes Mellitus Type II Hgb A1c 7.0 -CBG's q4h; Target range of 140 to 180 -SSI -Follow ICU Hypo/Hyperglycemia  protocol  Hypothyroidism -Follow thyroid panel -Continue home synthroid  Dementia PMHx: CVA -Provide supportive care -Promote normal sleep/wake cycle and family presence -Contine home Aricept and Namenda     Best Practice (right click and "Reselect all SmartList Selections" daily)   Diet/type: Regular consistency (see orders) DVT prophylaxis: SCD GI prophylaxis: N/A Lines: N/A Foley:   N/A Code Status:  full code Last date of multidisciplinary goals of care discussion [N/A]  Labs   CBC: Recent Labs  Lab 10/23/21 0451  WBC 8.4  HGB 9.1*  HCT 32.0*  MCV 79.2*  PLT 222    Basic Metabolic Panel: Recent Labs  Lab 10/23/21 0451  NA 145  K 4.3  CL 103  CO2 34*  GLUCOSE 119*  BUN 13  CREATININE 0.88  CALCIUM 7.9*   GFR: Estimated Creatinine Clearance: 63.5 mL/min (by C-G formula based on SCr of 0.88 mg/dL). Recent Labs  Lab 10/23/21 0451  WBC 8.4    Liver Function Tests: No results for input(s): "AST", "ALT", "ALKPHOS", "BILITOT", "PROT", "ALBUMIN" in the last 168 hours. No results for input(s): "LIPASE", "AMYLASE" in the last 168 hours. No results for input(s): "AMMONIA" in the last 168 hours.  ABG    Component Value Date/Time   PHART 7.37 10/23/2021 0944   PCO2ART 66 (HH) 10/23/2021 0944   PO2ART 60 (L) 10/23/2021 0944   HCO3 38.2 (H) 10/23/2021 0944   O2SAT 90.5 10/23/2021 0944     Coagulation Profile: No results for input(s): "INR", "PROTIME" in the last 168 hours.  Cardiac Enzymes: No results for input(s): "CKTOTAL", "CKMB", "CKMBINDEX", "TROPONINI" in the last 168 hours.  HbA1C: Hgb A1c MFr Bld  Date/Time Value Ref Range Status  10/23/2021 04:51 AM 7.0 (H) 4.8 - 5.6 % Final    Comment:    (NOTE) Pre diabetes:          5.7%-6.4%  Diabetes:              >6.4%  Glycemic control for   <7.0% adults with diabetes   06/21/2020 04:08 AM 7.5 (H) 4.8 - 5.6 % Final    Comment:    (NOTE) Pre diabetes:          5.7%-6.4%  Diabetes:              >6.4%  Glycemic control for   <7.0% adults with diabetes     CBG: Recent Labs  Lab 10/23/21 1543  GLUCAP 203*    Review of Systems:   Positives in BOLD: Gen: Denies fever, chills, weight change, fatigue, night sweats, +pain right hip at surgical site HEENT: Denies blurred vision, double vision, hearing loss, tinnitus, sinus congestion, rhinorrhea, sore throat, neck stiffness,  dysphagia PULM: Denies shortness of breath, cough, sputum production, hemoptysis, wheezing CV: Denies chest pain, edema, orthopnea, paroxysmal nocturnal dyspnea, palpitations GI: Denies abdominal pain, nausea, vomiting, diarrhea, hematochezia, melena, constipation, change in bowel habits GU: Denies dysuria, hematuria, polyuria, oliguria, urethral discharge Endocrine: Denies hot or cold intolerance, polyuria, polyphagia or appetite change Derm: Denies rash, dry skin, scaling or peeling skin change Heme: Denies easy bruising, bleeding, bleeding gums Neuro: Denies headache, numbness, weakness, slurred speech, loss of memory or consciousness, Dizziness   Past Medical History:  She,  has a past medical history of AAA (abdominal aortic aneurysm) (HCC), Anginal pain ([place]), Anxiety, Arthritis, Automatic implantable cardioverter-defibrillator in situ, Biventricular ICD  [employer], COPD (chronic obstructive pulmonary disease) ([place]), Deafness, Dementia ([place]), Depression, Dizziness, Dysrhythmia, Fracture, GERD (gastroesophageal reflux disease), HFrEF (heart  failure with reduced ejection fraction) ([place]), Hypertension, Hypothyroidism, Non-ischemic cardiomyopathy ([place]), Oxygen dependent, Palpitations, Shortness of breath dyspnea, Sleep apnea, and Wheezing.   Surgical History:   Past Surgical History:  Procedure Laterality Date   ABDOMINAL HYSTERECTOMY     BIV ICD GENERATOR CHANGEOUT N/A 09/17/2016   Procedure: BiV ICD Generator Changeout;  Surgeon: Deboraha Sprang, MD;  Location: [facility];  Service: Cardiovascular;  Laterality: N/A;   BLADDER SURGERY     CARDIAC CATHETERIZATION     CARDIAC DEFIBRILLATOR PLACEMENT  4 yrs ago   pacemaker   CATARACT EXTRACTION W/PHACO Left 09/01/2014   Procedure: CATARACT EXTRACTION PHACO AND INTRAOCULAR LENS PLACEMENT ([place]);  Surgeon: Lyla Glassing, MD;  Location: [facility];  Service: Ophthalmology;  Laterality: Left;  [place]: 1:12.1    CATARACT EXTRACTION W/PHACO  Right 09/29/2014   Procedure: CATARACT EXTRACTION PHACO AND INTRAOCULAR LENS PLACEMENT (IOC);  Surgeon: Lyla Glassing, MD;  Location: [facility];  Service: Ophthalmology;  Laterality: Right;  [place]: 00:52.7    CHOLECYSTECTOMY     COLON SURGERY     COLONOSCOPY     COLONOSCOPY N/A 08/31/2012   Procedure: COLONOSCOPY;  Surgeon: Inda Castle, MD;  Location: [facility];  Service: Endoscopy;  Laterality: N/A;   ESOPHAGOGASTRODUODENOSCOPY N/A 07/06/2012   Procedure: ESOPHAGOGASTRODUODENOSCOPY (EGD);  Surgeon: Lafayette Dragon, MD;  Location: [facility];  Service: Endoscopy;  Laterality: N/A;   KNEE SURGERY Right    MIDDLE EAR SURGERY     SAVORY DILATION N/A 07/06/2012   Procedure: SAVORY DILATION;  Surgeon: Lafayette Dragon, MD;  Location: [facility];  Service: Endoscopy;  Laterality: N/A;   TEE WITHOUT CARDIOVERSION N/A 06/23/2020   Procedure: TRANSESOPHAGEAL ECHOCARDIOGRAM (TEE);  Surgeon: Minna Merritts, MD;  Location: [facility];  Service: Cardiovascular;  Laterality: N/A;   WRIST SURGERY Right      Social History:   reports that she quit smoking about 19 years ago. Her smoking use included cigarettes. She has a 60.00 pack-year smoking history. She has never used smokeless tobacco. She reports that she does not drink alcohol and does not use drugs.   Family History:  Her family history includes Breast cancer in her mother; Colon cancer (age of onset: 24) in her brother; Heart attack in her sister and sister; Heart disease in her father; Hypertension in her brother and mother; Stroke in her mother.   Allergies Allergies  Allergen Reactions   Codeine Palpitations   Contrast Media [Iodinated Contrast Media] Rash     Home Medications  Prior to Admission medications   Medication Sig Start Date End Date Taking? Authorizing Provider  allopurinol (ZYLOPRIM) 100 MG tablet Take 300 mg by mouth daily.   Yes [provider]  atorvastatin (LIPITOR) 80 MG tablet Take 1 tablet (80 mg total) by  mouth daily. 07/12/21 07/07/22 Yes Wellington Hampshire, MD  clopidogrel (PLAVIX) 75 MG tablet Take 1 tablet (75 mg total) by mouth daily. 07/12/21  Yes Wellington Hampshire, MD  digoxin (LANOXIN) 0.125 MG tablet TAKE 1 TABLET BY MOUTH ON MONDAY, [facility] AND FRIDAY 10/10/21  Yes Wellington Hampshire, MD  donepezil (ARICEPT) 10 MG tablet Take 10 mg by mouth at bedtime.   Yes [provider]  furosemide (LASIX) 20 MG tablet Take 1 tablet (20 mg total) by mouth every other day. May take an additional tablet AS NEEDED for weight gain of 3 lbs overnight or 5 lbs in one week. Do not exceed more than 3 additional doses. -  Oral 10/10/21  Yes Wellington Hampshire, MD  gabapentin (NEURONTIN) 300 MG capsule Take 300 mg by mouth 2 (two) times daily as needed (RLS symptoms/ pain).    Yes [provider]  hydrOXYzine (VISTARIL) 25 MG capsule Take 25 mg by mouth at bedtime as needed for anxiety.   Yes [provider]  levothyroxine (SYNTHROID) 112 MCG tablet Take 112 mcg by mouth daily before breakfast.   Yes [provider]  losartan (COZAAR) 25 MG tablet Take 0.5 tablets (12.5 mg total) by mouth daily. 07/12/21  Yes Wellington Hampshire, MD  memantine (NAMENDA) 10 MG tablet Take 10 mg by mouth in the morning and at bedtime. 07/10/21  Yes [provider]  metFORMIN (GLUCOPHAGE-XR) 500 MG 24 hr tablet Take 500 mg by mouth 2 (two) times daily with a meal.    Yes [provider]  metoprolol succinate (TOPROL-XL) 25 MG 24 hr tablet Take 25 mg by mouth daily.   Yes [provider]  omeprazole (PRILOSEC) 20 MG capsule Take 1 capsule (20 mg total) by mouth every morning. 04/12/18  Yes Gladstone Lighter, MD  potassium chloride SA (KLOR-CON M) 20 MEQ tablet Take 1 tablet (20 mEq total) by mouth daily. 07/12/21  Yes Wellington Hampshire, MD  venlafaxine XR (EFFEXOR-XR) 75 MG 24 hr capsule Take 75 mg by mouth 2 (two) times daily. 10/21/21  Yes [provider]  vitamin B-12  (CYANOCOBALAMIN) 1000 MCG tablet Take 1,000 mcg by mouth 2 (two) times daily.    Yes [provider]  albuterol (ACCUNEB) 0.63 MG/3ML nebulizer solution Take 1 ampule by nebulization every 6 (six) hours as needed for wheezing.    [provider]  albuterol (PROVENTIL HFA;VENTOLIN HFA) 108 (90 Base) MCG/ACT inhaler Inhale 2 puffs into the lungs every 6 (six) hours as needed. 11/02/16   Orbie Pyo, MD  budesonide-formoterol (SYMBICORT) 160-4.5 MCG/ACT inhaler Inhale 2 puffs into the lungs 2 (two) times daily.    [provider]  Menthol, Topical Analgesic, (BENGAY EX) Apply 1 application topically daily as needed (back pain).    [provider]  mirtazapine (REMERON) 7.5 MG tablet Take 7.5 mg by mouth 2 (two) times daily. 10/10/21   [provider]  Sennosides (EX-LAX PO) Take 2 tablets by mouth at bedtime as needed (constipation).    [provider]  tiotropium (SPIRIVA) 18 MCG inhalation capsule Place 1 capsule (18 mcg total) into inhaler and inhale daily as needed (shortness of breath). 04/12/18   Gladstone Lighter, MD     Critical care time: 45 minutes    Venetia Night, AGACNP-BC Acute Care Nurse Practitioner [facility]   [phone] / [phone] Please see Amion for pager details.

## 2021-10-23 NOTE — Progress Notes (Signed)
Spoke with Primary RN Rhonda in PACU, they're trying to wean her off pressors at this time. USGPIV not needed for now and will let IVT know if needed. Will follow up.

## 2021-10-23 NOTE — Anesthesia Preprocedure Evaluation (Addendum)
Anesthesia Evaluation  Patient identified by MRN, date of birth, ID band Patient confused  General Assessment Comment:  Patient oriented to self. Does not know why she is here or what we are fixing in surgery.  Reviewed: Allergy & Precautions, NPO status , Patient's Chart, lab work & pertinent test results  History of Anesthesia Complications Negative for: history of anesthetic complications  Airway Mallampati: II  TM Distance: >3 FB Neck ROM: Full    Dental  (+) Edentulous Upper, Edentulous Lower   Pulmonary sleep apnea , COPD,  oxygen dependent, Patient abstained from smoking.Not current smoker, former smoker,     + decreased breath sounds      Cardiovascular Exercise Tolerance: Poor METShypertension, +CHF  (-) CAD and (-) Past MI + dysrhythmias + Cardiac Defibrillator  Rhythm:Regular Rate:Normal - Systolic murmurs 1. Left ventricular ejection fraction, by estimation, is 20 to 25%. The  left ventricle has severely decreased function. The left ventricle  demonstrates global hypokinesis. The left ventricular internal cavity size  was moderately dilated.  2. Right ventricular systolic function is moderately reduced. The right  ventricular size is normal.  3. Left atrial size was mildly dilated. No left atrial/left atrial  appendage thrombus was detected.  4. The mitral valve is normal in structure. Mild mitral valve  regurgitation.  5. Agitated saline contrast bubble study was negative, with no evidence  of any interatrial shunt.  6. There is Moderate (Grade III) atheroma plaque involving the transverse  and descending aorta.    Neuro/Psych PSYCHIATRIC DISORDERS Anxiety Depression Dementia CVA    GI/Hepatic GERD  Medicated and Controlled,(+)     (-) substance abuse  ,   Endo/Other  diabetes, Well ControlledHypothyroidism   Renal/GU negative Renal ROS     Musculoskeletal   Abdominal (+) + obese,   Peds   Hematology   Anesthesia Other Findings Past Medical History: No date: AAA (abdominal aortic aneurysm) (HCC) No date: Anginal pain ([place]) No date: Anxiety No date: Arthritis No date: Automatic implantable cardioverter-defibrillator in situ No date: Biventricular ICD  [employer] No date: COPD (chronic obstructive pulmonary disease) (HCC) No date: Deafness     Comment:  left ear No date: Dementia (HCC) No date: Depression No date: Dizziness No date: Dysrhythmia No date: Fracture     Comment:  history of spinal fracture No date: GERD (gastroesophageal reflux disease) No date: HFrEF (heart failure with reduced ejection fraction) ([place])     Comment:  a. 2010 Cath: nonobs dzs, EF 15-20%; b. 09/2017 Echo: EF               suboptimal. EF low nl; c. 04/2018 Echo: EF 40-45%. No date: Hypertension No date: Hypothyroidism No date: Non-ischemic cardiomyopathy [facility])     Comment:  a. 2010 Cath: nonobs dzs, EF 15-20%; b. 2011 s/p SJM               CRT-D; c. 09/2016 s/p Gen change; d. 09/2017 Echo: EF               suboptimal. EF low nl; e. 04/2018 Echo: EF 40-45%. No date: Oxygen dependent No date: Palpitations No date: Shortness of breath dyspnea No date: Sleep apnea No date: Wheezing  Reproductive/Obstetrics                            Anesthesia Physical Anesthesia Plan  ASA: 4  Anesthesia Plan: General   Post-op Pain Management: Ofirmev IV (intra-op)*  Induction: Intravenous  PONV Risk Score and Plan: 4 or greater and Ondansetron, Dexamethasone and Treatment may vary due to age or medical condition  Airway Management Planned: Oral ETT  Additional Equipment: None  Intra-op Plan:   Post-operative Plan: Extubation in OR and Possible Post-op intubation/ventilation  Informed Consent: I have reviewed the patients History and Physical, chart, labs and discussed the procedure including the risks, benefits and alternatives for the proposed anesthesia with the patient  or authorized representative who has indicated his/her understanding and acceptance.     Dental advisory given and Consent reviewed with POA  Plan Discussed with: CRNA and Surgeon  Anesthesia Plan Comments: (Discussed risks of anesthesia with patient's husband by phone, and son in person (patient is confused), including PONV, sore throat, lip/dental/eye damage. Rare risks discussed as well, such as cardiorespiratory and neurological sequelae, and allergic reactions, and death. Discussed possible prolonged intubation postop. Discussed the role of CRNA in patient's perioperative care. They understand.)        Anesthesia Quick Evaluation

## 2021-10-23 NOTE — Progress Notes (Signed)
PAtient with low sats, BP still low, DR Afams in to evaluate pt, LAsix given after CXR obtained, Purewick placed on patient, bladder scanned for 173cc, awaiting hospitalist for orders for stepdowm

## 2021-10-23 NOTE — Progress Notes (Signed)
D/W anesthesiologist, patient developed low BP during the surgery and Phenylnephrine started.   Blood loss during ORIF was about 200 ml. Patient does not complain about chest pain and her O2 demand is about the same this morning at 5 L and saturation 93%. Chest xray done showed mild overload and one dose of IV Lasix given while patient on pressor.  D/W ICU attending Dr. Jenell Milliner, patient will be transferred to [facility] unit.  Ordered Stat CBC, BMP, Trop and EKG.

## 2021-10-23 NOTE — ED Notes (Signed)
Family leaving and will be updated via phone. Lights turned off to promote pt comfort. Call bell in reach.

## 2021-10-23 NOTE — ED Provider Notes (Signed)
[facility] Provider Note    Event Date/Time   First MD Initiated Contact with Patient 10/23/21 0423     (approximate)   History   Fall   HPI  Stacy Grimes is a 70 y.o. female brought to the ED via EMS from home with a chief complaint of mechanical fall with right foot and hip pain.  Patient tripped and fell while using the restroom.  Struck her right foot against the bathtub and fell onto her right hip.  Denies striking head or LOC.  Takes Plavix.  Denies headache, vision changes, chest pain, shortness of breath, abdominal pain, nausea, vomiting or dizziness.     Past Medical History   Past Medical History:  Diagnosis Date   AAA (abdominal aortic aneurysm) (HCC)    Anginal pain ([place])    Anxiety    Arthritis    Automatic implantable cardioverter-defibrillator in situ    Biventricular ICD  [employer]    COPD (chronic obstructive pulmonary disease) (HCC)    Deafness    left ear   Dementia (HCC)    Depression    Dizziness    Dysrhythmia    Fracture    history of spinal fracture   GERD (gastroesophageal reflux disease)    HFrEF (heart failure with reduced ejection fraction) ([place])    a. 2010 Cath: nonobs dzs, EF 15-20%; b. 09/2017 Echo: EF suboptimal. EF low nl; c. 04/2018 Echo: EF 40-45%.   Hypertension    Hypothyroidism    Non-ischemic cardiomyopathy ([place])    a. 2010 Cath: nonobs dzs, EF 15-20%; b. 2011 s/p SJM CRT-D; c. 09/2016 s/p Gen change; d. 09/2017 Echo: EF suboptimal. EF low nl; e. 04/2018 Echo: EF 40-45%.   Oxygen dependent    Palpitations    Shortness of breath dyspnea    Sleep apnea    Wheezing      Active Problem List   Patient Active Problem List   Diagnosis Date Noted   Orthostatic hypotension 08/06/2021   Chronic congestive heart failure ([place])    Acute ischemic stroke ([place]) 06/21/2020   Chronic obstructive pulmonary disease, unspecified COPD type ([place]) 06/20/2020   Type 2 diabetes mellitus with hyperglycemia, unspecified  whether long term insulin use ([place]) 06/20/2020   Left knee pain 12/16/2019   Elevated troponin I level 12/15/2019   Dehydration 04/12/2018   Syncope 04/09/2018   Benign neoplasm of colon 08/31/2012   Diverticulosis of colon (without mention of hemorrhage) 08/31/2012   Fatigue 08/10/2012   Personal history of colonic polyps 07/02/2012   Dysphagia, unspecified(787.20) 07/02/2012   Unspecified constipation 07/02/2012   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 07/09/2010   HYPERTENSION, BENIGN 03/24/2009   SLEEP APNEA, OBSTRUCTIVE 03/23/2009   Dilated cardiomyopathy ([place]) 03/23/2009   PALPITATIONS 03/23/2009   MURMUR 03/23/2009   COUGH 03/23/2009     Past Surgical History   Past Surgical History:  Procedure Laterality Date   ABDOMINAL HYSTERECTOMY     BIV ICD GENERATOR CHANGEOUT N/A 09/17/2016   Procedure: BiV ICD Generator Changeout;  Surgeon: Deboraha Sprang, MD;  Location: [facility];  Service: Cardiovascular;  Laterality: N/A;   BLADDER SURGERY     CARDIAC CATHETERIZATION     CARDIAC DEFIBRILLATOR PLACEMENT  4 yrs ago   pacemaker   CATARACT EXTRACTION W/PHACO Left 09/01/2014   Procedure: CATARACT EXTRACTION PHACO AND INTRAOCULAR LENS PLACEMENT ([place]);  Surgeon: Lyla Glassing, MD;  Location: [facility];  Service: Ophthalmology;  Laterality: Left;  [place]: 1:12.1  CATARACT EXTRACTION W/PHACO Right 09/29/2014   Procedure: CATARACT EXTRACTION PHACO AND INTRAOCULAR LENS PLACEMENT (I[facility]);  Surgeon: Lyla Glassing, MD;  Location: [facility];  Service: Ophthalmology;  Laterality: Right;  [place]: 00:52.7    CHOLECYSTECTOMY     COLON SURGERY     COLONOSCOPY     COLONOSCOPY N/A 08/31/2012   Procedure: COLONOSCOPY;  Surgeon: Inda Castle, MD;  Location: [facility];  Service: Endoscopy;  Laterality: N/A;   ESOPHAGOGASTRODUODENOSCOPY N/A 07/06/2012   Procedure: ESOPHAGOGASTRODUODENOSCOPY (EGD);  Surgeon: Lafayette Dragon, MD;  Location: [facility];  Service:  Endoscopy;  Laterality: N/A;   KNEE SURGERY Right    MIDDLE EAR SURGERY     SAVORY DILATION N/A 07/06/2012   Procedure: SAVORY DILATION;  Surgeon: Lafayette Dragon, MD;  Location: [facility];  Service: Endoscopy;  Laterality: N/A;   TEE WITHOUT CARDIOVERSION N/A 06/23/2020   Procedure: TRANSESOPHAGEAL ECHOCARDIOGRAM (TEE);  Surgeon: Minna Merritts, MD;  Location: [facility];  Service: Cardiovascular;  Laterality: N/A;   WRIST SURGERY Right      Home Medications   Prior to Admission medications   Medication Sig Start Date End Date Taking? Authorizing Provider  albuterol (ACCUNEB) 0.63 MG/3ML nebulizer solution Take 1 ampule by nebulization every 6 (six) hours as needed for wheezing.    [provider]  albuterol (PROVENTIL HFA;VENTOLIN HFA) 108 (90 Base) MCG/ACT inhaler Inhale 2 puffs into the lungs every 6 (six) hours as needed. 11/02/16   Schaevitz, Randall An, MD  allopurinol (ZYLOPRIM) 300 MG tablet Take 300 mg by mouth 2 (two) times daily.    [provider]  atorvastatin (LIPITOR) 80 MG tablet Take 1 tablet (80 mg total) by mouth daily. 07/12/21 07/07/22  Wellington Hampshire, MD  budesonide-formoterol (SYMBICORT) 160-4.5 MCG/ACT inhaler Inhale 2 puffs into the lungs 2 (two) times daily.    [provider]  clopidogrel (PLAVIX) 75 MG tablet Take 1 tablet (75 mg total) by mouth daily. 07/12/21   Wellington Hampshire, MD  digoxin (LANOXIN) 0.125 MG tablet TAKE 1 TABLET BY MOUTH ON MONDAY, [facility] AND FRIDAY 10/10/21   Wellington Hampshire, MD  donepezil (ARICEPT) 5 MG tablet Take 5 mg by mouth at bedtime.    [provider]  furosemide (LASIX) 20 MG tablet Take 1 tablet (20 mg total) by mouth every other day. May take an additional tablet AS NEEDED for weight gain of 3 lbs overnight or 5 lbs in one week. Do not exceed more than 3 additional doses. - Oral 10/10/21   Wellington Hampshire, MD  gabapentin (NEURONTIN) 300 MG capsule Take 300 mg by mouth 2 (two) times daily as  needed (RLS symptoms/ pain).     [provider]  hydrOXYzine (VISTARIL) 25 MG capsule Take 25 mg by mouth at bedtime as needed for anxiety.    [provider]  levothyroxine (SYNTHROID) 112 MCG tablet Take 112 mcg by mouth daily before breakfast.    [provider]  losartan (COZAAR) 25 MG tablet Take 0.5 tablets (12.5 mg total) by mouth daily. 07/12/21   Wellington Hampshire, MD  memantine (NAMENDA) 5 MG tablet Take by mouth in the morning and at bedtime. 07/10/21   [provider]  Menthol, Topical Analgesic, (BENGAY EX) Apply 1 application topically daily as needed (back pain).    [provider]  metFORMIN (GLUCOPHAGE-XR) 500 MG 24 hr tablet Take 500 mg by mouth 2 (two) times daily with a meal.     [provider]  metoprolol succinate (TOPROL-XL) 25 MG 24 hr tablet Take 25 mg by mouth daily.    [provider]  omeprazole (PRILOSEC) 20 MG capsule Take 1 capsule (20 mg total) by mouth every morning. 04/12/18   Gladstone Lighter, MD  potassium chloride SA (KLOR-CON M) 20 MEQ tablet Take 1 tablet (20 mEq total) by mouth daily. 07/12/21   Wellington Hampshire, MD  Sennosides (EX-LAX PO) Take 2 tablets by mouth at bedtime as needed (constipation).    [provider]  tiotropium (SPIRIVA) 18 MCG inhalation capsule Place 1 capsule (18 mcg total) into inhaler and inhale daily as needed (shortness of breath). 04/12/18   Gladstone Lighter, MD  venlafaxine (EFFEXOR) 75 MG tablet Take 75 mg by mouth 2 (two) times daily.    [provider]  vitamin B-12 (CYANOCOBALAMIN) 1000 MCG tablet Take 1,000 mcg by mouth 2 (two) times daily.     [provider]     Allergies  Codeine and Contrast media [iodinated contrast media]   Family History   Family History  Problem Relation Age of Onset   Hypertension Mother    Breast cancer Mother    Stroke Mother    Heart disease Father    Heart attack Sister    Hypertension Brother     Heart attack Sister    Colon cancer Brother 50     Physical Exam  Triage Vital Signs: ED Triage Vitals  Enc Vitals Group     BP      Pulse      Resp      Temp      Temp src      SpO2      Weight      Height      Head Circumference      Peak Flow      Pain Score      Pain Loc      Pain Edu?      Excl. in [place]?     Updated Vital Signs: BP (!) 122/48   Pulse 86   Temp 97.8 F (36.6 C) (Oral)   Resp (!) 21   SpO2 95%    General: Awake, moderate distress.  Laying on left side. CV:  RRR.  Good peripheral perfusion.  Resp:  Normal effort.  CTAB. Abd:  Nontender.  No distention.  Other:  RLE: Bruising to distal foot.  Pelvis is stable.  Right hip tender to palpation with limited range of motion.   ED Results / Procedures / Treatments  Labs (all labs ordered are listed, but only abnormal results are displayed) Labs Reviewed  CBC - Abnormal; Notable for the following components:      Result Value   Hemoglobin 9.1 (*)    HCT 32.0 (*)    MCV 79.2 (*)    MCH 22.5 (*)    MCHC 28.4 (*)    RDW 18.3 (*)    All other components within normal limits  BASIC METABOLIC PANEL - Abnormal; Notable for the following components:   CO2 34 (*)    Glucose, Bld 119 (*)    Calcium 7.9 (*)    All other components within normal limits  TROPONIN I (HIGH SENSITIVITY)  TROPONIN I (HIGH SENSITIVITY)     EKG  None   RADIOLOGY X-rays pending   Official radiology report(s): No results found.   PROCEDURES:  Critical Care performed: No  .1-3 Lead EKG Interpretation  Performed by: Paulette Blanch, MD  Authorized by: Paulette Blanch, MD     Interpretation: normal     ECG rate:  90   ECG rate assessment: normal     Rhythm: sinus rhythm     Ectopy: none     Conduction: normal   Comments:     Patient placed on cardiac monitor to evaluate for arrhythmias    MEDICATIONS ORDERED IN ED: Medications  fentaNYL (SUBLIMAZE) injection 50 mcg (50 mcg Intravenous Given 10/23/21 0458)   ondansetron (ZOFRAN) injection 4 mg (4 mg Intravenous Given 10/23/21 0456)  HYDROmorphone (DILAUDID) injection 0.5 mg (0.5 mg Intravenous Given 10/23/21 0529)  ondansetron (ZOFRAN) injection 4 mg (4 mg Intravenous Given 10/23/21 0529)     IMPRESSION / MDM / ASSESSMENT AND PLAN / ED COURSE  I reviewed the triage vital signs and the nursing notes.                             71 year old female presenting with right foot and hip pain status post mechanical fall.  Differential diagnosis includes but is not limited to fracture, dislocation, musculoskeletal contusion, hematoma, etc.  I have personally reviewed patient's records and note her neurology office visit from 08/20/2021.  Patient's presentation is most consistent with acute presentation with potential threat to life or bodily function.  The patient is on the cardiac monitor to evaluate for evidence of arrhythmia and/or significant heart rate changes.  We will obtain x-ray imaging studies of right foot and hip, administer IV fentanyl for pain and reassess.  Clinical Course as of 10/23/21 0656  Tue Oct 23, 2021  0521 X-ray has not been able to obtain images yet secondary to patient having a bowel movement [JS]  [phone] Patient has now been cleaned up by nursing staff and is awaiting x-ray.  Care will be transferred to the oncoming provider Dr. Archie Balboa at change of shift pending x-rays. [JS]    Clinical Course User Index [JS] Paulette Blanch, MD     FINAL CLINICAL IMPRESSION(S) / ED DIAGNOSES   Final diagnoses:  Fall, initial encounter  Right hip pain  Right foot pain     Rx / DC Orders   ED Discharge Orders     None        Note:  This document was prepared using Dragon voice recognition software and may include unintentional dictation errors.   Paulette Blanch, MD 10/23/21 [phone]

## 2021-10-23 NOTE — Op Note (Signed)
10/23/2021  3:31 PM  Patient:   Stacy Grimes  Pre-Op Diagnosis:   Closed comminuted displaced intertrochanteric/basicervical fracture, right hip  Post-Op Diagnosis:   Same  Procedure:   Reduction and internal fixation of displaced right hip fracture with Biomet Affixis TFN nail.  Surgeon:   Pascal Lux, MD  Assistant:   None  Anesthesia:   GET  Findings:   As above  Complications:   None  EBL:   200 cc  Fluids:   500 cc crystalloid  UOP:   None  TT:   None  Drains:   None  Closure:   Staples  Implants:   Biomet Affixis 11 x 360 mm TFN with a 95 mm lag screw, an 80 mm derotation screw, and a 42 mm distal interlocking screw  Brief Clinical Note:   The patient is a 71 year old female with multiple medical problems who sustained above-noted injury earlier this morning when she apparently lost her balance and fell while getting up from the toilet in her home and landing on her right side. She was brought to the emergency room where x-rays demonstrated the above-noted injury. The patient has been cleared medically and presents at this time for reduction and internal fixation of the closed comminuted displaced intertrochanteric/basicervical right hip fracture.  Procedure:   The patient was brought into the operating room and lain in the supine position. After adequate general endotracheal intubation and anesthesia were obtained, the patient was repositioned on the fracture table. The uninjured leg was placed in a flexed and abducted position while the injured lower extremity was placed in longitudinal traction. The fracture was reduced using longitudinal traction and internal rotation. The adequacy of reduction was verified fluoroscopically in AP and lateral projections and found to be satisfactory. The lateral aspects of the right hip and thigh were prepped with ChloraPrep solution before being draped sterilely. Preoperative antibiotics were administered. A timeout was  performed to verify the appropriate surgical site.   The greater trochanter was identified fluoroscopically and an approximately 6-7 cm incision made about 2-3 fingerbreadths above the tip of the greater trochanter. The incision was carried down through the subcutaneous tissues to expose the gluteal fascia. This was split the length of the incision, providing access to the tip of the trochanter. Under fluoroscopic guidance, a guidewire was drilled through the tip of the trochanter into the proximal metaphysis to the level of the lesser trochanter. After verifying its position fluoroscopically in AP and lateral projections, it was overreamed with the initial reamer to the depth of the lesser trochanter. A guidewire was passed down through the femoral canal to the supracondylar region. The adequacy of guidewire position was verified fluoroscopically in AP and lateral projections before the length of the guidewire within the canal was measured and found to be 380 mm. Therefore, a 360 mm length nail was selected. The guidewire was overreamed sequentially using the flexible reamers, beginning with a 9.5 mm reamer and progressing to a 12.5 mm reamer. This provided good cortical chatter. The 11 x 360 mm Biomet Affixis TFN rod was selected and advanced to the appropriate depth, as verified fluoroscopically.   The guide system for the lag screw was positioned and advanced through an approximately 2-3 cm stab incision over the lateral aspect of the proximal femur. The guidewire was drilled up through the trochanteric femoral nail and into the femoral neck to rest within 5 mm of subchondral bone. After verifying its position in the femoral neck and head  in both AP and lateral projections, the guidewire was measured and found to be optimally replicated by a 95 mm lag screw. Because of the potential for instability and subsequent rotation of the femoral head during insertion of the lag screw, a second guidewire was passed up  through the guide system using the derotation screw position and advanced to within 1 cm of subchondral bone. The initial guidewire was overreamed to the appropriate depth before the lag screw was inserted and advanced to the appropriate depth as verified fluoroscopically in AP and lateral projections. At this point, traction was released from the leg before compression was applied across the fracture site using the proper wheel under fluoroscopic visualization. Once adequate compression had been applied across the fracture site, the locking screw was advanced, then backed off a quarter turn to set the lag screw. At this point, the drill for the derotation screw was placed into the derotation position and drilled to the appropriate depth. An 80 mm derotation screw was selected and inserted to the appropriate depth. Again the adequacy of hardware position and fracture reduction was verified fluoroscopically in AP and lateral projections and found to be excellent.  Attention was directed distally. Using the "perfect circle" technique, the leg and fluoroscopy machine were positioned appropriately. An approximately 1.5 cm stab incision was made over the skin at the appropriate point before the drill bit was advanced through the cortex and across the static hole of the nail. The appropriate length of the screw was determined before the 42 mm distal interlocking screw was positioned, then advanced and tightened securely. Again the adequacy of screw position was verified fluoroscopically in AP and lateral projections and found to be excellent.  The wounds were irrigated thoroughly with sterile saline solution before the abductor fascia was reapproximated using #1 Vicryl interrupted sutures. The subcutaneous tissues were closed using 2-0 Vicryl interrupted sutures. The skin was closed using staples. A total of 10 cc of Exparel plus 20 cc of 0.5% Sensorcaine with epinephrine was injected in and around all incisions.  Sterile occlusive dressings were applied to all wounds before the patient was transferred back to her hospital bed. The patient was then awakened, extubated, and returned to the recovery room in satisfactory condition after tolerating the procedure well.

## 2021-10-23 NOTE — Consult Note (Signed)
ORTHOPAEDIC CONSULTATION  REQUESTING PHYSICIAN: Lequita Halt, MD  Chief Complaint:   Right hip pain.  History of Present Illness: Stacy Grimes is a 71 y.o. female with multiple medical problems, including coronary artery disease, COPD, anxiety/depression, dementia, hypertension, hypothyroidism, gastroesophageal reflux disease, sleep apnea, and osteoporosis who lives with her husband in a mobile home unit.  Apparently, the patient was in her usual state of health this morning when she tried to get up from the toilet, lost her balance, and fell on to her right side, injuring her right hip.  The patient was brought to the emergency room where x-rays demonstrated a displaced intertrochanteric/basicervical fracture of her right hip.  The patient normally ambulates without any assistive device, although she will use a cane on occasion, but does have poor balance, according to her son.  The patient denies any associated injuries.  She did not strike her head or lose consciousness from the fall.  The patient also denies any lightheadedness, dizziness, chest pain, shortness of breath, or other symptoms that may have precipitated her fall.  Past Medical History:  Diagnosis Date   AAA (abdominal aortic aneurysm) (HCC)    Anginal pain ([place])    Anxiety    Arthritis    Automatic implantable cardioverter-defibrillator in situ    Biventricular ICD  [place]    COPD (chronic obstructive pulmonary disease) (HCC)    Deafness    left ear   Dementia (HCC)    Depression    Dizziness    Dysrhythmia    Fracture    history of spinal fracture   GERD (gastroesophageal reflux disease)    HFrEF (heart failure with reduced ejection fraction) ([place])    a. 2010 Cath: nonobs dzs, EF 15-20%; b. 09/2017 Echo: EF suboptimal. EF low nl; c. 04/2018 Echo: EF 40-45%.   Hypertension    Hypothyroidism    Non-ischemic cardiomyopathy ([place])    a. 2010 Cath:  nonobs dzs, EF 15-20%; b. 2011 s/p SJM CRT-D; c. 09/2016 s/p Gen change; d. 09/2017 Echo: EF suboptimal. EF low nl; e. 04/2018 Echo: EF 40-45%.   Oxygen dependent    Palpitations    Shortness of breath dyspnea    Sleep apnea    Wheezing    Past Surgical History:  Procedure Laterality Date   ABDOMINAL HYSTERECTOMY     BIV ICD GENERATOR CHANGEOUT N/A 09/17/2016   Procedure: BiV ICD Generator Changeout;  Surgeon: Deboraha Sprang, MD;  Location: [facility];  Service: Cardiovascular;  Laterality: N/A;   BLADDER SURGERY     CARDIAC CATHETERIZATION     CARDIAC DEFIBRILLATOR PLACEMENT  4 yrs ago   pacemaker   CATARACT EXTRACTION W/PHACO Left 09/01/2014   Procedure: CATARACT EXTRACTION PHACO AND INTRAOCULAR LENS PLACEMENT ([place]);  Surgeon: Lyla Glassing, MD;  Location: [facility];  Service: Ophthalmology;  Laterality: Left;  [place]: 1:12.1    CATARACT EXTRACTION W/PHACO Right 09/29/2014   Procedure: CATARACT EXTRACTION PHACO AND INTRAOCULAR LENS PLACEMENT ([facility]);  Surgeon: Lyla Glassing, MD;  Location: [facility];  Service: Ophthalmology;  Laterality: Right;  [place]: 00:52.7    CHOLECYSTECTOMY     COLON SURGERY     COLONOSCOPY     COLONOSCOPY N/A 08/31/2012   Procedure: COLONOSCOPY;  Surgeon: Inda Castle, MD;  Location: [facility];  Service: Endoscopy;  Laterality: N/A;   ESOPHAGOGASTRODUODENOSCOPY N/A 07/06/2012   Procedure: ESOPHAGOGASTRODUODENOSCOPY (EGD);  Surgeon: Lafayette Dragon, MD;  Location: [facility];  Service: Endoscopy;  Laterality: N/A;  KNEE SURGERY Right    MIDDLE EAR SURGERY     SAVORY DILATION N/A 07/06/2012   Procedure: SAVORY DILATION;  Surgeon: Lafayette Dragon, MD;  Location: [facility];  Service: Endoscopy;  Laterality: N/A;   TEE WITHOUT CARDIOVERSION N/A 06/23/2020   Procedure: TRANSESOPHAGEAL ECHOCARDIOGRAM (TEE);  Surgeon: Minna Merritts, MD;  Location: [facility];  Service: Cardiovascular;  Laterality: N/A;   WRIST SURGERY Right    Social History   Socioeconomic  History   Marital status: Married    Spouse name: Not on file   Number of children: 3   Years of education: Not on file   Highest education level: Not on file  Occupational History   Occupation: Retired    Fish farm manager: DISABLED  Tobacco Use   Smoking status: Former    Packs/day: 3.00    Years: 20.00    Total pack years: 60.00    Types: Cigarettes    Quit date: 02/04/2002    Years since quitting: 19.7   Smokeless tobacco: Never  Vaping Use   Vaping Use: Never used  Substance and Sexual Activity   Alcohol use: No   Drug use: No   Sexual activity: Not on file  Other Topics Concern   Not on file  Social History Narrative   Not on file   Social Determinants of Health   Financial Resource Strain: Not on file  Food Insecurity: Not on file  Transportation Needs: Not on file  Physical Activity: Not on file  Stress: Not on file  Social Connections: Not on file   Family History  Problem Relation Age of Onset   Hypertension Mother    Breast cancer Mother    Stroke Mother    Heart disease Father    Heart attack Sister    Hypertension Brother    Heart attack Sister    Colon cancer Brother 65   Allergies  Allergen Reactions   Codeine Palpitations   Contrast Media [Iodinated Contrast Media] Rash   Prior to Admission medications   Medication Sig Start Date End Date Taking? Authorizing Provider  allopurinol (ZYLOPRIM) 100 MG tablet Take 300 mg by mouth daily.   Yes [provider]  atorvastatin (LIPITOR) 80 MG tablet Take 1 tablet (80 mg total) by mouth daily. 07/12/21 07/07/22 Yes Wellington Hampshire, MD  clopidogrel (PLAVIX) 75 MG tablet Take 1 tablet (75 mg total) by mouth daily. 07/12/21  Yes Wellington Hampshire, MD  digoxin (LANOXIN) 0.125 MG tablet TAKE 1 TABLET BY MOUTH ON MONDAY, [facility] AND FRIDAY 10/10/21  Yes Wellington Hampshire, MD  donepezil (ARICEPT) 10 MG tablet Take 10 mg by mouth at bedtime.   Yes [provider]  furosemide (LASIX) 20 MG tablet Take 1  tablet (20 mg total) by mouth every other day. May take an additional tablet AS NEEDED for weight gain of 3 lbs overnight or 5 lbs in one week. Do not exceed more than 3 additional doses. - Oral 10/10/21  Yes Wellington Hampshire, MD  gabapentin (NEURONTIN) 300 MG capsule Take 300 mg by mouth 2 (two) times daily as needed (RLS symptoms/ pain).    Yes [provider]  hydrOXYzine (VISTARIL) 25 MG capsule Take 25 mg by mouth at bedtime as needed for anxiety.   Yes [provider]  levothyroxine (SYNTHROID) 112 MCG tablet Take 112 mcg by mouth daily before breakfast.   Yes [provider]  losartan (COZAAR) 25 MG tablet Take 0.5 tablets (12.5 mg total)  by mouth daily. 07/12/21  Yes Wellington Hampshire, MD  memantine (NAMENDA) 10 MG tablet Take 10 mg by mouth in the morning and at bedtime. 07/10/21  Yes [provider]  metFORMIN (GLUCOPHAGE-XR) 500 MG 24 hr tablet Take 500 mg by mouth 2 (two) times daily with a meal.    Yes [provider]  metoprolol succinate (TOPROL-XL) 25 MG 24 hr tablet Take 25 mg by mouth daily.   Yes [provider]  omeprazole (PRILOSEC) 20 MG capsule Take 1 capsule (20 mg total) by mouth every morning. 04/12/18  Yes Gladstone Lighter, MD  potassium chloride SA (KLOR-CON M) 20 MEQ tablet Take 1 tablet (20 mEq total) by mouth daily. 07/12/21  Yes Wellington Hampshire, MD  venlafaxine XR (EFFEXOR-XR) 75 MG 24 hr capsule Take 75 mg by mouth 2 (two) times daily. 10/21/21  Yes [provider]  vitamin B-12 (CYANOCOBALAMIN) 1000 MCG tablet Take 1,000 mcg by mouth 2 (two) times daily.    Yes [provider]  albuterol (ACCUNEB) 0.63 MG/3ML nebulizer solution Take 1 ampule by nebulization every 6 (six) hours as needed for wheezing.    [provider]  albuterol (PROVENTIL HFA;VENTOLIN HFA) 108 (90 Base) MCG/ACT inhaler Inhale 2 puffs into the lungs every 6 (six) hours as needed. 11/02/16   Orbie Pyo, MD   budesonide-formoterol (SYMBICORT) 160-4.5 MCG/ACT inhaler Inhale 2 puffs into the lungs 2 (two) times daily.    [provider]  Menthol, Topical Analgesic, (BENGAY EX) Apply 1 application topically daily as needed (back pain).    [provider]  mirtazapine (REMERON) 7.5 MG tablet Take 7.5 mg by mouth 2 (two) times daily. 10/10/21   [provider]  Sennosides (EX-LAX PO) Take 2 tablets by mouth at bedtime as needed (constipation).    [provider]  tiotropium (SPIRIVA) 18 MCG inhalation capsule Place 1 capsule (18 mcg total) into inhaler and inhale daily as needed (shortness of breath). 04/12/18   Gladstone Lighter, MD   CT Hip Right Wo Contrast  Result Date: 10/23/2021 CLINICAL DATA:  Right hip fracture EXAM: CT OF THE RIGHT HIP WITHOUT CONTRAST TECHNIQUE: Multidetector CT imaging of the right hip was performed according to the standard protocol. Multiplanar CT image reconstructions were also generated. RADIATION DOSE REDUCTION: This exam was performed according to the departmental dose-optimization program which includes automated exposure control, adjustment of the mA and/or kV according to patient size and/or use of iterative reconstruction technique. COMPARISON:  10/23/2021 FINDINGS: Bones/Joint/Cartilage Acute comminuted fracture of the proximal right femur with intertrochanteric and basicervical components. Moderate shortening with mild varus angulation. There are a few mildly displaced fracture fragments including dominant 2.3 x 0.6 cm fragment along the posterior aspect of the femoral neck. Femoroacetabular joint alignment is maintained without dislocation. Mild hip joint space narrowing. The visualized portion of the right hemipelvis is intact. Ligaments Suboptimally assessed by CT. Muscles and Tendons No acute musculotendinous abnormality by CT. Soft tissues No fluid collection or hematoma.  No right inguinal lymphadenopathy. IMPRESSION: Acute comminuted  fracture of the proximal right femur with intertrochanteric and basicervical components. Electronically Signed   By: Davina Poke D.O.   On: 10/23/2021 09:48   DG Chest Port 1 View  Result Date: 10/23/2021 CLINICAL DATA:  [place].  Right hip fracture. EXAM: PORTABLE CHEST 1 VIEW COMPARISON:  12/15/2019 FINDINGS: The pacer wires/AICD are stable. Stable cardiac enlargement. Stable mild tortuosity and calcification of the thoracic aorta. The lungs are clear. No pleural effusion  or pneumothorax. The bony thorax is intact. IMPRESSION: No acute cardiopulmonary findings. Electronically Signed   By: Marijo Sanes M.D.   On: 10/23/2021 08:09   DG Foot Complete Right  Result Date: 10/23/2021 CLINICAL DATA:  Tripped and fell.  Right foot pain. EXAM: RIGHT FOOT COMPLETE - 3+ VIEW COMPARISON:  None Available. FINDINGS: The joint spaces are fairly well maintained. Minimal degenerative changes. No acute fracture is identified. IMPRESSION: No acute bony findings. Electronically Signed   By: Marijo Sanes M.D.   On: 10/23/2021 08:03   DG Hip Unilat With Pelvis 2-3 Views Right  Result Date: 10/23/2021 CLINICAL DATA:  [place].  Right hip pain. EXAM: DG HIP (WITH OR WITHOUT PELVIS) 2-3V RIGHT COMPARISON:  None Available. FINDINGS: There is a displaced low femoral neck fracture with mild comminution. The left hip is intact. The pubic symphysis and SI joints are intact. No pelvic fractures are identified. IMPRESSION: Displaced low femoral neck fracture. Electronically Signed   By: Marijo Sanes M.D.   On: 10/23/2021 08:01    Positive ROS: All other systems have been reviewed and were otherwise negative with the exception of those mentioned in the HPI and as above.  Physical Exam: General:  Alert, no acute distress Psychiatric:  Patient is not competent for consent but exhibits normal mood and affect   Cardiovascular:  No pedal edema Respiratory:  No wheezing, non-labored breathing GI:  Abdomen is soft and  non-tender Skin:  No lesions in the area of chief complaint Neurologic:  Sensation intact distally Lymphatic:  No axillary or cervical lymphadenopathy  Orthopedic Exam:  Orthopedic examination is limited to the right hip and lower extremity.  The right lower extremity is obviously shortened and externally rotated as compared to the left.  Skin inspection of the right hip is unremarkable.  No swelling, erythema, ecchymosis, abrasions, or other skin abnormalities are identified.  She has mild-moderate tenderness to palpation over the lateral aspect of the hip.  She has more severe pain with any attempted active or passive motion of the hip.  Grossly, she is neurovascularly intact to the right lower extremity and foot, demonstrating the ability to dorsiflex and plantarflex her toes and ankle.  Sensation is intact to light touch to all distributions.  She has good capillary refill to all toes.  X-rays:  X-rays and recent CT scan of the right hip are available for review and have been reviewed by myself.  These films demonstrate a comminuted intertrochanteric/basicervical fracture of the right hip with obvious displacement.  No significant degenerative changes of the hip joint are noted.  No lytic lesions or other acute bony abnormalities are identified.  Assessment: Closed displaced comminuted intertrochanteric/basicervical fracture of right hip.  Plan: The treatment options, including both surgical and nonsurgical choices, have been discussed in detail with the patient and her family.  She and her family would like to proceed with surgical intervention to include an intramedullary nailing of the displaced right hip fracture.  The risks (including bleeding, infection, nerve and/or blood vessel injury, persistent or recurrent pain, loosening or failure of the components, leg length inequality, dislocation, need for further surgery, blood clots, strokes, heart attacks or arrhythmias, pneumonia, etc.) and  benefits of the surgical procedure were discussed.  The patient states his/her understanding and agrees to proceed.  A formal written consent will be obtained by the nursing staff.    Pascal Lux, MD  Beeper #:  [phone]  10/23/2021 1:20 PM

## 2021-10-23 NOTE — ED Notes (Signed)
Pt transported to CT ?

## 2021-10-23 NOTE — ED Notes (Signed)
Admission MD at bedside and made aware pt oxygen sats 87-89% on 6L.

## 2021-10-23 NOTE — Progress Notes (Signed)
An USGPIV (ultrasound guided PIV) has been placed (R anterior forearm 22g) for short-term vasopressor infusion. A correctly placed ivWatch must be used when administering Vasopressors. Should this treatment be needed beyond 72 hours, central line access should be obtained.  It will be the responsibility of the bedside nurse to follow best practice to prevent extravasations.

## 2021-10-23 NOTE — Transfer of Care (Signed)
Immediate Anesthesia Transfer of Care Note  Patient: Stacy Grimes  Procedure(s) Performed: INTRAMEDULLARY (IM) NAIL INTERTROCHANTERIC (Right: Hip)  Patient Location: PACU  Anesthesia Type:General  Level of Consciousness: drowsy  Airway & Oxygen Therapy: Patient Spontanous Breathing and Patient connected to face mask oxygen  Post-op Assessment: Report given to RN and Post -op Vital signs reviewed and stable  Post vital signs: Reviewed and stable  Last Vitals:  Vitals Value Taken Time  BP 119/61 10/23/21 1545  Temp 36.4 C 10/23/21 1545  Pulse 104 10/23/21 1550  Resp 24 10/23/21 1550  SpO2 94 % 10/23/21 1550  Vitals shown include unvalidated device data.  Last Pain:  Vitals:   10/23/21 1545  TempSrc:   PainSc: 0-No pain         Complications: No notable events documented.

## 2021-10-24 ENCOUNTER — Encounter: Payer: Self-pay | Admitting: Surgery

## 2021-10-24 ENCOUNTER — Inpatient Hospital Stay: Payer: Medicare HMO

## 2021-10-24 DIAGNOSIS — R579 Shock, unspecified: Secondary | ICD-10-CM

## 2021-10-24 DIAGNOSIS — I5021 Acute systolic (congestive) heart failure: Secondary | ICD-10-CM | POA: Diagnosis not present

## 2021-10-24 DIAGNOSIS — S72001A Fracture of unspecified part of neck of right femur, initial encounter for closed fracture: Secondary | ICD-10-CM | POA: Diagnosis not present

## 2021-10-24 LAB — CBC
HCT: 32.7 % — ABNORMAL LOW (ref 36.0–46.0)
Hemoglobin: 9.5 g/dL — ABNORMAL LOW (ref 12.0–15.0)
MCH: 24.2 pg — ABNORMAL LOW (ref 26.0–34.0)
MCHC: 29.1 g/dL — ABNORMAL LOW (ref 30.0–36.0)
MCV: 83.4 fL (ref 80.0–100.0)
Platelets: 247 10*3/uL (ref 150–400)
RBC: 3.92 MIL/uL (ref 3.87–5.11)
RDW: 19.6 % — ABNORMAL HIGH (ref 11.5–15.5)
WBC: 12.1 10*3/uL — ABNORMAL HIGH (ref 4.0–10.5)
nRBC: 0.2 % (ref 0.0–0.2)

## 2021-10-24 LAB — ECHOCARDIOGRAM COMPLETE
Height: 63 in
S' Lateral: 3.7 cm
Weight: 3361.57 oz

## 2021-10-24 LAB — TROPONIN I (HIGH SENSITIVITY)
Troponin I (High Sensitivity): 114 ng/L (ref <18)
Troponin I (High Sensitivity): 129 ng/L (ref <18)
Troponin I (High Sensitivity): 186 ng/L (ref <18)
Troponin I (High Sensitivity): 246 ng/L (ref <18)
Troponin I (High Sensitivity): 194 ng/L (ref <18)

## 2021-10-24 LAB — LACTIC ACID, PLASMA
Lactic Acid, Venous: 2.5 mmol/L (ref 0.5–1.9)
Lactic Acid, Venous: 2.7 mmol/L (ref 0.5–1.9)
Lactic Acid, Venous: 3 mmol/L (ref 0.5–1.9)
Lactic Acid, Venous: 3.3 mmol/L (ref 0.5–1.9)
Lactic Acid, Venous: 3.3 mmol/L (ref 0.5–1.9)

## 2021-10-24 LAB — BASIC METABOLIC PANEL
Anion gap: 8 (ref 5–15)
BUN: 18 mg/dL (ref 8–23)
CO2: 33 mmol/L — ABNORMAL HIGH (ref 22–32)
Calcium: 7.7 mg/dL — ABNORMAL LOW (ref 8.9–10.3)
Chloride: 105 mmol/L (ref 98–111)
Creatinine, Ser: 1.27 mg/dL — ABNORMAL HIGH (ref 0.44–1.00)
GFR, Estimated: 45 mL/min — ABNORMAL LOW (ref >60)
Glucose, Bld: 119 mg/dL — ABNORMAL HIGH (ref 70–99)
Potassium: 4.1 mmol/L (ref 3.5–5.1)
Sodium: 146 mmol/L — ABNORMAL HIGH (ref 135–145)

## 2021-10-24 LAB — BPAM RBC
Blood Product Expiration Date: 202310282359
ISSUE DATE / TIME: 202309192152
Unit Type and Rh: 5100

## 2021-10-24 LAB — MAGNESIUM: Magnesium: 2.3 mg/dL (ref 1.7–2.4)

## 2021-10-24 LAB — GLUCOSE, CAPILLARY
Glucose-Capillary: 101 mg/dL — ABNORMAL HIGH (ref 70–99)
Glucose-Capillary: 165 mg/dL — ABNORMAL HIGH (ref 70–99)
Glucose-Capillary: 245 mg/dL — ABNORMAL HIGH (ref 70–99)
Glucose-Capillary: 146 mg/dL — ABNORMAL HIGH (ref 70–99)

## 2021-10-24 LAB — TYPE AND SCREEN
ABO/RH(D): O POS
Antibody Screen: NEGATIVE
Unit division: 0

## 2021-10-24 LAB — PROCALCITONIN: Procalcitonin: 0.63 ng/mL

## 2021-10-24 LAB — PHOSPHORUS: Phosphorus: 5.9 mg/dL — ABNORMAL HIGH (ref 2.5–4.6)

## 2021-10-24 MED ORDER — MIDODRINE HCL 5 MG PO TABS
10.0000 mg | ORAL_TABLET | Freq: Three times a day (TID) | ORAL | Status: DC
  Administered 2021-10-24 – 2021-10-25 (×3): 10 mg via ORAL
  Filled 2021-10-24 (×3): qty 2

## 2021-10-24 MED ORDER — ORAL CARE MOUTH RINSE: 15.0000 mL | OROMUCOSAL | Status: DC | PRN

## 2021-10-24 MED ORDER — ENSURE ENLIVE PO LIQD
237.0000 mL | Freq: Two times a day (BID) | ORAL | Status: DC
  Administered 2021-10-24 – 2021-10-30 (×7): 237 mL via ORAL

## 2021-10-24 MED ORDER — NOREPINEPHRINE 4 MG/250ML-% IV SOLN
2.0000 ug/min | INTRAVENOUS | Status: DC
  Administered 2021-10-24: 8 ug/min via INTRAVENOUS
  Filled 2021-10-24: qty 250

## 2021-10-24 MED ORDER — ADULT MULTIVITAMIN W/MINERALS CH
1.0000 | ORAL_TABLET | Freq: Every day | ORAL | Status: DC
  Administered 2021-10-25 – 2021-10-30 (×6): 1 via ORAL
  Filled 2021-10-24 (×6): qty 1

## 2021-10-24 MED ORDER — ALPRAZOLAM 0.5 MG PO TABS
0.2500 mg | ORAL_TABLET | Freq: Two times a day (BID) | ORAL | Status: DC | PRN
  Administered 2021-10-24 – 2021-10-29 (×5): 0.25 mg via ORAL
  Filled 2021-10-24 (×6): qty 1

## 2021-10-24 MED ORDER — SODIUM CHLORIDE 0.9 % IV SOLN
250.0000 mL | INTRAVENOUS | Status: DC
  Administered 2021-10-24: 250 mL via INTRAVENOUS

## 2021-10-24 MED ORDER — LACTATED RINGERS IV BOLUS
250.0000 mL | Freq: Once | INTRAVENOUS | Status: AC
  Administered 2021-10-24: 250 mL via INTRAVENOUS

## 2021-10-24 NOTE — Progress Notes (Signed)
PROGRESS NOTE    Stacy Grimes  TFT:[id number] DOB: 1950-09-11 DOA: 10/23/2021 PCP: Cyndi Bender, PA-C    Brief Narrative:  71 y.o. female with medical history significant of nonischemic cardiomyopathy with our UF 20-25%, status post AICD, COPD with chronic hypoxic respite failure on 3-4 L continuously at home, CVA, HTN, AAA, chronic normocytic anemia, presented with mechanical fall and right hip pain.   Appears that patient has a chronic right hip/knee pain, and occasionally she has had episodes of " right leg give up" with near fall and falls.  She had at least 2-3 episode before today.  This morning, patient went to the bathroom, while trying to stand up from toilet bowl, she again felt right leg weakness and fell on the right side and hit her hip.  She denies any prodrome of lightheadedness chest pain dizziness shortness of breath.   She has a chronic O2 dependent on 3 to 4 L, but over the weekend, patient developed a new nonproductive cough and wheezing, family has turned oxygen to 5 L yesterday.  Denies any chest pain no fever chills.  CT pelvis showed right femoral comminuted fracture with intertrochanteric and cervical components.  Taken operating room with orthopedics on 9/19.  Tolerated procedure well however postoperatively became hypotensive requiring initiation of vasopressors.  Was transferred to stepdown unit with PCCM consultation.  Following morning maps are improving.  PCCM remains engaged.  Patient switched to Levophed from Neo-Synephrine.  Remains on 5 L.  Assessment & Plan:   Principal Problem:   Hip fracture ([place]) Active Problems:   Hip fracture, unspecified laterality, closed, initial encounter ([place])   CHF (congestive heart failure) ([place])   Shock circulatory ([place])  Postoperative hypotension/shock Unclear etiology.  Suspect related to hypovolemia due to intraoperative blood loss.  Cardiogenic shock versus iatrogenic related to pain medication and anesthesia  remains on differential Plan: Continue stepdown status for now With the pressor support per PCCM Cautious intravenous fluids 2D echo cardiogram Hold outpatient beta-blockade  Acute COPD exacerbation Acute on chronic hypoxic respiratory failure -Active wheezing cough, increasing oxygen demand -No clearly infiltrates or consolidation on chest x-ray afebrile, no leukocytosis Plan: -IV Solu-Medrol, continue LABA, Spiriva, inhaled steroid and DuoNebs -Incentive spirometry and flutter valve -Wean oxygen as tolerated   Chronic HFrEF -Euvolemic -Lasix on hold in setting of shock/hypotension -Gentle IVF   Right proximal femoral comminuted fracture -Tolerated procedure well  -Postop hypotension noted -Orthopedic follow-up for wound check   PAF -On sinus rhythm, continue digoxin Monday Wednesday Friday -Beta-blocker on hold   IIDM -Switch to sliding scale for now hold metformin   Diabetic neuropathy -Continue gabapentin   Hx of CVA -Continue aspirin, restart Plavix if okay with orthopedic surgery tomorrow   Hypothyroidism, early dementia -Stable  Acute kidney injury Suspect secondary to ATN versus prerenal azotemia Gentle IVF Daily labs Avoid nonessential nephrotoxins        DVT prophylaxis: SQ Lovenox Code Status: Full Family Communication: Sela Hilding [phone] on 9/20 Disposition Plan: Status is: Inpatient Remains inpatient appropriate because: post operative    Level of care: Stepdown  Consultants:  PCCM Orthopedics  Procedures:  Hip ORIF  Antimicrobials: None   Subjective: Seen and examined.  No acute distress.  A bit lethargic but had just awakened from sleep.  Endorses hunger.  Objective: Vitals:   10/24/21 1120 10/24/21 1200 10/24/21 1500 10/24/21 1600  BP: (!) 104/53  (!) 127/49 (!) 140/69  Pulse: 95  100 (!) 102  Resp: (!) 24  12   Temp:  98 F (36.7 C)    TempSrc:  Oral    SpO2: 96%  95% 97%  Weight:      Height:         Intake/Output Summary (Last 24 hours) at 10/24/2021 1616 Last data filed at 10/24/2021 1612 Gross per 24 hour  Intake 3128.53 ml  Output 825 ml  Net 2303.53 ml   Filed Weights   10/23/21 1002 10/23/21 1855  Weight: 95.3 kg 95.3 kg    Examination:  General exam: Appears calm and comfortable  Respiratory system: Clear to auscultation. Respiratory effort normal. Cardiovascular system: S1-S2, distant heart sounds, no murmurs, no pedal edema Gastrointestinal system: Soft, NT/ND, normal bowel sounds Central nervous system: Lethargic, alert and oriented. No focal neurological deficits. Extremities: Right hip surgical site, dressing CDI Skin: No rashes, lesions or ulcers Psychiatry: Judgement and insight appear normal. Mood & affect appropriate.     Data Reviewed: I have personally reviewed following labs and imaging studies  CBC: Recent Labs  Lab 10/23/21 0451 10/23/21 1939 10/24/21 0209  WBC 8.4 15.7* 12.1*  HGB 9.1* 7.3* 9.5*  HCT 32.0* 26.1* 32.7*  MCV 79.2* 80.6 83.4  PLT 287 279 416   Basic Metabolic Panel: Recent Labs  Lab 10/23/21 0451 10/23/21 1939 10/24/21 0209  NA 145 143 146*  K 4.3 3.6 4.1  CL 103 100 105  CO2 34* 31 33*  GLUCOSE 119* 138* 119*  BUN 13 15 18   CREATININE 0.88 1.18* 1.27*  CALCIUM 7.9* 7.9* 7.7*  MG  --  1.4* 2.3  PHOS  --  4.2 5.9*   GFR: Estimated Creatinine Clearance: 44 mL/min (A) (by C-G formula based on SCr of 1.27 mg/dL (H)). Liver Function Tests: Recent Labs  Lab 10/23/21 1939  ALBUMIN 2.5*   No results for input(s): "LIPASE", "AMYLASE" in the last 168 hours. No results for input(s): "AMMONIA" in the last 168 hours. Coagulation Profile: No results for input(s): "INR", "PROTIME" in the last 168 hours. Cardiac Enzymes: No results for input(s): "CKTOTAL", "CKMB", "CKMBINDEX", "TROPONINI" in the last 168 hours. BNP (last 3 results) No results for input(s): "PROBNP" in the last 8760 hours. HbA1C: Recent Labs     10/23/21 0451  HGBA1C 7.0*   CBG: Recent Labs  Lab 10/23/21 1858 10/23/21 2055 10/24/21 0725 10/24/21 1127 10/24/21 1543  GLUCAP 142* 138* 101* 165* 245*   Lipid Profile: No results for input(s): "CHOL", "HDL", "LDLCALC", "TRIG", "CHOLHDL", "LDLDIRECT" in the last 72 hours. Thyroid Function Tests: No results for input(s): "TSH", "T4TOTAL", "FREET4", "T3FREE", "THYROIDAB" in the last 72 hours. Anemia Panel: Recent Labs    10/23/21 0451  FERRITIN 33  TIBC 356  IRON 98  RETICCTPCT 0.8   Sepsis Labs: Recent Labs  Lab 10/24/21 0209 10/24/21 0428 10/24/21 0828 10/24/21 1106 10/24/21 1320  PROCALCITON 0.63  --   --   --   --   LATICACIDVEN  --  2.5* 2.7* 3.3* 3.3*    Recent Results (from the past 240 hour(s))  MRSA Next Gen by PCR, Nasal     Status: None   Collection Time: 10/23/21  7:01 PM   Specimen: Nasal Mucosa; Nasal Swab  Result Value Ref Range Status   MRSA by PCR Next Gen NOT DETECTED NOT DETECTED Final    Comment: (NOTE) The GeneXpert MRSA Assay (FDA approved for NASAL specimens only), is one component of a comprehensive MRSA colonization surveillance program. It is not intended to diagnose MRSA infection nor to  guide or monitor treatment for MRSA infections. Test performance is not FDA approved in patients less than 8 years old. Performed at [facility], [address] [place], [place] [address]          Radiology Studies: ECHOCARDIOGRAM COMPLETE  Result Date: 10/24/2021    ECHOCARDIOGRAM REPORT   Patient Name:   Stacy [facility] Date of Exam: 10/23/2021 Medical Rec #:  [id number]        Height:       63.0 in Accession #:    [id number]       Weight:       210.1 lb Date of Birth:  08/19/1950         BSA:          1.975 m Patient Age:    89 years         BP:           100/60 mmHg Patient Gender: F                HR:           95 bpm. Exam Location:  [facility] Procedure: 2D Echo, Cardiac Doppler, Color Doppler and Intracardiac             Opacification Agent Indications:     J62.83 Acute Systolic CHF  History:         Patient has prior history of Echocardiogram examinations, most                  recent 06/23/2020. CHF, COPD, Signs/Symptoms:Shortness of                  Breath; Risk Factors:Hypertension and Sleep Apnea. AAA.                  Nonishemic cardiomyopathy. Palpitations. Hypothyroidism.  Sonographer:     Cresenciano Lick RDCS Referring Phys:  [id number] Bradly Bienenstock Diagnosing Phys: Nelva Bush MD IMPRESSIONS  1. Left ventricular ejection fraction, by estimation, is 40 to 45%. The left ventricle has mildly decreased function. The left ventricle demonstrates global hypokinesis. There is mild left ventricular hypertrophy. Indeterminate diastolic filling due to E-A fusion.  2. Right ventricular systolic function is mildly reduced. The right ventricular size is moderately enlarged. There is severely elevated pulmonary artery systolic pressure.  3. The mitral valve is normal in structure. Trivial mitral valve regurgitation.  4. Tricuspid valve regurgitation is mild to moderate.  5. The aortic valve is tricuspid. Aortic valve regurgitation is not visualized. No aortic stenosis is present.  6. The inferior vena cava is normal in size with <50% respiratory variability, suggesting right atrial pressure of 8 mmHg. Comparison(s): A prior study was performed on 06/22/2020. LVEF is significantly higher. Right ventricle appears dilated and hypokinetic on today's study. FINDINGS  Left Ventricle: Left ventricular ejection fraction, by estimation, is 40 to 45%. The left ventricle has mildly decreased function. The left ventricle demonstrates global hypokinesis. Definity contrast agent was given IV to delineate the left ventricular  endocardial borders. The left ventricular internal cavity size was normal in size. There is mild left ventricular hypertrophy. Indeterminate diastolic filling due to E-A fusion. Right Ventricle: The right ventricular  size is moderately enlarged. No increase in right ventricular wall thickness. Right ventricular systolic function is mildly reduced. There is severely elevated pulmonary artery systolic pressure. The tricuspid regurgitant velocity is 4.07 m/s, and with an assumed right atrial pressure of 8 mmHg, the estimated right ventricular systolic pressure  is 74.3 mmHg. Left Atrium: Left atrial size was normal in size. Right Atrium: Right atrial size was normal in size. Pericardium: There is no evidence of pericardial effusion. Mitral Valve: The mitral valve is normal in structure. Trivial mitral valve regurgitation. Tricuspid Valve: The tricuspid valve is normal in structure. Tricuspid valve regurgitation is mild to moderate. Aortic Valve: The aortic valve is tricuspid. Aortic valve regurgitation is not visualized. No aortic stenosis is present. Pulmonic Valve: The pulmonic valve was not well visualized. Pulmonic valve regurgitation is not visualized. No evidence of pulmonic stenosis. Aorta: The aortic root is normal in size and structure. Pulmonary Artery: The pulmonary artery is of normal size. Venous: The inferior vena cava is normal in size with less than 50% respiratory variability, suggesting right atrial pressure of 8 mmHg. IAS/Shunts: The interatrial septum was not well visualized. Additional Comments: A device lead is visualized in the right atrium and right ventricle.  LEFT VENTRICLE PLAX 2D LVIDd:         4.70 cm   Diastology LVIDs:         3.70 cm   LV e' medial:    7.24 cm/s LV PW:         1.21 cm   LV E/e' medial:  16.3 LV IVS:        0.97 cm   LV e' lateral:   5.44 cm/s LVOT diam:     1.70 cm   LV E/e' lateral: 21.7 LV SV:         35 LV SV Index:   18 LVOT Area:     2.27 cm  RIGHT VENTRICLE             IVC RV Basal diam:  4.80 cm     IVC diam: 1.90 cm RV S prime:     12.60 cm/s TAPSE (M-mode): 1.9 cm LEFT ATRIUM             Index        RIGHT ATRIUM           Index LA diam:        4.40 cm 2.23 cm/m   RA Area:      16.20 cm LA Vol (A2C):   37.6 ml 19.04 ml/m  RA Volume:   50.90 ml  25.78 ml/m LA Vol (A4C):   31.6 ml 16.00 ml/m LA Biplane Vol: 35.3 ml 17.88 ml/m  AORTIC VALVE LVOT Vmax:   111.67 cm/s LVOT Vmean:  66.600 cm/s LVOT VTI:    0.154 m  AORTA Ao Root diam: 3.00 cm MV E velocity: 118.00 cm/s  TRICUSPID VALVE MV A velocity: 132.00 cm/s  TR Peak grad:   66.3 mmHg MV E/A ratio:  0.89         TR Vmax:        407.00 cm/s                              SHUNTS                             Systemic VTI:  0.15 m                             Systemic Diam: 1.70 cm Nelva Bush MD Electronically signed by Nelva Bush MD Signature Date/Time: 10/24/2021/7:18:44 AM    Final  DG Chest Port 1 View  Result Date: 10/24/2021 CLINICAL DATA:  Acute on chronic respiratory failure EXAM: PORTABLE CHEST 1 VIEW COMPARISON:  10/23/2021 FINDINGS: Cardiac shadow is stable. Defibrillator is again noted and stable. Aortic calcifications are seen. The lungs are well aerated bilaterally. No focal infiltrate or effusion is seen. No bony abnormality is noted. IMPRESSION: No acute abnormality noted. Electronically Signed   By: Inez Catalina M.D.   On: 10/24/2021 04:15   DG Chest 1 View  Result Date: 10/23/2021 CLINICAL DATA:  Fluid overload.  History of CHF. EXAM: CHEST  1 VIEW COMPARISON:  Radiograph earlier today. FINDINGS: Left-sided pacemaker in place. Similar cardiomegaly. Aortic atherosclerosis. Increasing interstitial opacities typical of pulmonary edema. There is hazy opacity throughout the left hemithorax which may represent layering effusion. No confluent consolidation. No pneumothorax. IMPRESSION: 1. Increasing interstitial opacities typical of pulmonary edema. 2. Hazy opacity throughout the left hemithorax which may represent layering effusion. 3. Cardiomegaly. Electronically Signed   By: Keith Rake M.D.   On: 10/23/2021 17:58   DG HIP UNILAT WITH PELVIS 1V RIGHT  Result Date: 10/23/2021 CLINICAL DATA:  Fracture  right femur EXAM: DG HIP (WITH OR WITHOUT PELVIS) 1V RIGHT COMPARISON:  Study done earlier today FINDINGS: Fluoroscopic images show reduction and internal fixation of comminuted intertrochanteric fracture of neck of right femur with intramedullary rod. Fluoroscopy time 1 minutes and 45 seconds. Radiation dose 23.21 mGy. IMPRESSION: Fluoroscopic assistance was provided for reduction and internal fixation of intertrochanteric fracture of proximal right femur. Electronically Signed   By: Elmer Picker M.D.   On: 10/23/2021 15:15   DG C-Arm 1-60 Min-No Report  Result Date: 10/23/2021 Fluoroscopy was utilized by the requesting physician.  No radiographic interpretation.   CT Hip Right Wo Contrast  Result Date: 10/23/2021 CLINICAL DATA:  Right hip fracture EXAM: CT OF THE RIGHT HIP WITHOUT CONTRAST TECHNIQUE: Multidetector CT imaging of the right hip was performed according to the standard protocol. Multiplanar CT image reconstructions were also generated. RADIATION DOSE REDUCTION: This exam was performed according to the departmental dose-optimization program which includes automated exposure control, adjustment of the mA and/or kV according to patient size and/or use of iterative reconstruction technique. COMPARISON:  10/23/2021 FINDINGS: Bones/Joint/Cartilage Acute comminuted fracture of the proximal right femur with intertrochanteric and basicervical components. Moderate shortening with mild varus angulation. There are a few mildly displaced fracture fragments including dominant 2.3 x 0.6 cm fragment along the posterior aspect of the femoral neck. Femoroacetabular joint alignment is maintained without dislocation. Mild hip joint space narrowing. The visualized portion of the right hemipelvis is intact. Ligaments Suboptimally assessed by CT. Muscles and Tendons No acute musculotendinous abnormality by CT. Soft tissues No fluid collection or hematoma.  No right inguinal lymphadenopathy. IMPRESSION: Acute  comminuted fracture of the proximal right femur with intertrochanteric and basicervical components. Electronically Signed   By: Davina Poke D.O.   On: 10/23/2021 09:48   DG Chest Port 1 View  Result Date: 10/23/2021 CLINICAL DATA:  [place].  Right hip fracture. EXAM: PORTABLE CHEST 1 VIEW COMPARISON:  12/15/2019 FINDINGS: The pacer wires/AICD are stable. Stable cardiac enlargement. Stable mild tortuosity and calcification of the thoracic aorta. The lungs are clear. No pleural effusion or pneumothorax. The bony thorax is intact. IMPRESSION: No acute cardiopulmonary findings. Electronically Signed   By: Marijo Sanes M.D.   On: 10/23/2021 08:09   DG Foot Complete Right  Result Date: 10/23/2021 CLINICAL DATA:  Tripped and fell.  Right foot pain. EXAM: RIGHT  FOOT COMPLETE - 3+ VIEW COMPARISON:  None Available. FINDINGS: The joint spaces are fairly well maintained. Minimal degenerative changes. No acute fracture is identified. IMPRESSION: No acute bony findings. Electronically Signed   By: Marijo Sanes M.D.   On: 10/23/2021 08:03   DG Hip Unilat With Pelvis 2-3 Views Right  Result Date: 10/23/2021 CLINICAL DATA:  [place].  Right hip pain. EXAM: DG HIP (WITH OR WITHOUT PELVIS) 2-3V RIGHT COMPARISON:  None Available. FINDINGS: There is a displaced low femoral neck fracture with mild comminution. The left hip is intact. The pubic symphysis and SI joints are intact. No pelvic fractures are identified. IMPRESSION: Displaced low femoral neck fracture. Electronically Signed   By: Marijo Sanes M.D.   On: 10/23/2021 08:01        Scheduled Meds:  acetaminophen  500 mg Oral Q6H   allopurinol  100 mg Oral BID   atorvastatin  80 mg Oral QPM   Chlorhexidine Gluconate Cloth  6 each Topical Q0600   digoxin  0.125 mg Oral Once per day on Mon Wed Fri   docusate sodium  100 mg Oral BID   donepezil  10 mg Oral QHS   enoxaparin (LOVENOX) injection  0.5 mg/kg Subcutaneous Q24H   feeding supplement  237 mL Oral BID  BM   gabapentin  300 mg Oral BID   guaiFENesin  1,200 mg Oral BID   insulin aspart  0-15 Units Subcutaneous TID WC   levothyroxine  100 mcg Oral Q0600   memantine  10 mg Oral BID   methylPREDNISolone (SOLU-MEDROL) injection  80 mg Intravenous Q24H   midodrine  10 mg Oral TID WC   mometasone-formoterol  2 puff Inhalation BID   [START ON 10/25/2021] multivitamin with minerals  1 tablet Oral Daily   pantoprazole  40 mg Oral Daily   tiotropium  18 mcg Inhalation Daily   venlafaxine XR  75 mg Oral BID   Continuous Infusions:  sodium chloride Stopped (10/24/21 1106)   sodium chloride Stopped (10/23/21 1958)   sodium chloride 20 mL/hr at 10/24/21 1612   norepinephrine (LEVOPHED) Adult infusion 4 mcg/min (10/24/21 1612)     LOS: 1 day     Sidney Ace, MD [facility]   If 7PM-7AM, please contact night-coverage  10/24/2021, 4:16 PM

## 2021-10-24 NOTE — Progress Notes (Signed)
NAME:  Stacy Grimes, MRN:  [id number], DOB:  Oct 11, 1950, LOS: 1 ADMISSION DATE:  10/23/2021, CONSULTATION DATE:  10/23/2021 REFERRING MD:  Dr. Roosevelt Locks, CHIEF COMPLAINT:  Hypotension   Brief Pt Description / Synopsis:  71 year old female with past medical history most notable for ischemic cardiomyopathy with EF 20 to 25% and COPD requiring 3 to 4 L baseline supplemental oxygen at home, admitted with Closed displaced comminuted intertrochanteric/basicervical fracture of right hip status post fall at home.  Underwent reduction and internal fixation of displaced right hip fracture, postop with hypotension and hypoxia.  History of Present Illness:  Stacy Grimes is a 72 year old female with a past medical history significant for nonischemic cardiomyopathy with last EF of 20 to 25% status post AICD, COPD with chronic hypoxic respiratory failure requiring 3 to 4 L supplemental O2 at home, CVA, hypertension, AAA, chronic normocytic anemia who presented to [facility] ED on 10/23/2021 status post fall with associated right hip pain.  She reported she occasionally has episodes where she feels her right leg gives up with near falls.  Earlier this morning when she went to the bathroom while trying to stand up from the toilet bowl she developed right leg weakness and fell on her right side hitting her hip.  She denied lightheadedness, dizziness, chest pain, shortness of breath.  She did report that over the weekend she developed a new nonproductive cough and wheezing with slight increase in oxygen requirements up to 5 L.  She denied chest pain, palpitations, shortness of breath, fever, chills, abdominal pain, nausea, vomiting, dysuria.  ED Course: Initial Vital Signs: Temperature 97.8 F orally, respiratory rate 21, pulse 89, blood pressure 127/66, SPO2 91% on 3 L nasal cannula Significant Labs: Bicarbonate 34, glucose 119, high-sensitivity troponin 8, WBC 8.4, hemoglobin 9.1, hematocrit 32, MCV 79.2, MCH 22.5,  hemoglobin A1c 7.0 ABG: pH 7.37/PCO2 66/PO2 60/bicarb 38.2 Imaging Chest X-ray>>IMPRESSION: No acute cardiopulmonary findings. X-ray right foot>>IMPRESSION: No acute bony findings CT right hip>>IMPRESSION: Acute comminuted fracture of the proximal right femur with intertrochanteric and basicervical components.  Hospitalist were asked to admit the patient for further work-up and treatment.  Orthopedics was consulted, with plans to go to the OR for ORIF.  Please see "significant hospital events" section below for full detailed hospital course.  Pertinent  Medical History   Past Medical History:  Diagnosis Date   AAA (abdominal aortic aneurysm) (HCC)    Anginal pain ([place])    Anxiety    Arthritis    Automatic implantable cardioverter-defibrillator in situ    Biventricular ICD  [employer]    COPD (chronic obstructive pulmonary disease) ([place])    Deafness    left ear   Dementia (HCC)    Depression    Dizziness    Dysrhythmia    Fracture    history of spinal fracture   GERD (gastroesophageal reflux disease)    HFrEF (heart failure with reduced ejection fraction) ([place])    a. 2010 Cath: nonobs dzs, EF 15-20%; b. 09/2017 Echo: EF suboptimal. EF low nl; c. 04/2018 Echo: EF 40-45%.   Hypertension    Hypothyroidism    Non-ischemic cardiomyopathy ([place])    a. 2010 Cath: nonobs dzs, EF 15-20%; b. 2011 s/p SJM CRT-D; c. 09/2016 s/p Gen change; d. 09/2017 Echo: EF suboptimal. EF low nl; e. 04/2018 Echo: EF 40-45%.   Oxygen dependent    Palpitations    Shortness of breath dyspnea    Sleep apnea    Wheezing    Micro  Data:  N/A  Antimicrobials:  Cefazolin 9/19 (surgical prophylaxis)>>  Significant Hospital Events: Including procedures, antibiotic start and stop dates in addition to other pertinent events   9/19: Admitted by hospitalist for right hip fracture.  Ortho consulted, taken to the OR for ORIF. 9/19: Intra-Op with hypotension, estimated blood loss 200 cc requiring phenylephrine.   Postop with hypoxia, chest x-ray concerning for pulmonary edema.  Transferred to stepdown, PCCM consulted for assistance with hypotension. 9/20: Pt remains on neo-synephrine gtt @70  mcg/min.  Received 1 unit of pRBC's on 09/19 for hgb 7.3 post transfusion hgb 9.5  Interim History / Subjective:  As outlined above   Objective   Blood pressure 126/65, pulse 94, temperature 98.2 F (36.8 C), temperature source Oral, resp. rate 14, height 5\' 3"  (1.6 m), weight 95.3 kg, SpO2 100 %.        Intake/Output Summary (Last 24 hours) at 10/24/2021 0749 Last data filed at 10/24/2021 0600 Gross per 24 hour  Intake 2656.21 ml  Output 725 ml  Net 1931.21 ml   Filed Weights   10/23/21 1002 10/23/21 1855  Weight: 95.3 kg 95.3 kg    Examination: General: Acute on chronically ill-appearing female, on 5L O2 via nasal canula NAD  HENT: Atraumatic, normocephalic, neck supple, no JVD  Lungs: Clear throughout, even, non labored  Cardiovascular: Paced rhythm, 2+ radial/2+ distal pulses, no edema  Abdomen: +BS x4, obese, soft, non tender, non distended  Extremities: Right hip with honeycomb dressing clean dry and intact, trace edema bilateral lower extremities Neuro: Awake and alert, disoriented to time and situation at times, very hard of hearing, moves all extremities to command, no focal deficits noted, speech clear GU: External female catheter in place  Resolved Hospital Problem list     Assessment & Plan:   Hypotension: Suspect hypovolemia due to acute blood loss Intra-Op +/- Cardiogenic +/- related to pain medication/anesthesia Acute on chronic HFrEF Elevated Troponin secondary to N-STEMI vs [place] Ischemia prolonged Qtc in the setting of hypomagesemia PMHx: Ischemic cardiomyopathy with EF 20 to 25% s/p AICD, paroxysmal atrial fibrillation, HTN, AAA TEE from 09/21/20 with LVEF 20/25%, moderately reduced RV function.  BNP: 316.7 >> 20 mg of  Lasix given in PACU - Continuous cardiac monitoring -  Cautious IV fluids - Transfusions as indicated - Vasopressors as needed to maintain MAP 65 or higher  - Trend lactic acid until normalized - Trend HS Troponin until peaked > systemic anticoagulation will need to be cleared by Ortho - Check cortisol - Echocardiogram pending  - Diuresis as BP and renal function permits - Continue to hold outpatient beta blocker due to hypotension  - Avoid Qtc prolonging medications  Acute on chronic hypoxic and hypercapnic respiratory failure in the setting of acute decompensated HFrEF PMHx: COPD requiring 3 to 4 L supplemental oxygen at baseline, OSA - Supplemental O2 as needed to maintain O2 sats 88 to 92% - Prn Bipap - Follow intermittent Chest X-ray & ABG as needed - Bronchodilators an needed; continue home [place] and [place] - IV Steroids will wean to 40 mg daily  - Diuresis as BP and renal function permits  - Pulmonary toilet as able  Leukocytosis~improving  Lactic Acidosis - Trend WBC and monitor fever curve  - Trend PCT  - No signs of active infection no indication for abx therapy at this time  - Gentle IVF resuscitation due to respiratory status red  Acute Kidney Injury secondary to ATN  Mild hypernatremia  Baseline Cr: 0.88, Cr on  admission: 1.18 - Trend BMP  - Replace electrolytes as indicated  - Monitor UOP - Avoid nephrotoxic agents as able, ensure adequate renal perfusion  Closed displaced comminuted intertrochanteric/basicervical fracture of right hip. S/p ORIF on 9/19 - Orthopedics following, appreciate input - Continue current pain regimen: Dilaudid, Norco, Toradol  Anemia of chronic disease superimposed on Acute blood loss anemia intra-op~improving s/p 1 unit pRBC's 09/19 - Monitor for S/Sx of bleeding - SCD's and subq lovenox for VTE px if hgb continues to trend down will discontinue lovenox  - Transfuse for Hgb <8  Diabetes Mellitus Type II Hgb A1c 7.0 - CBG's ac/hs; Target range of 140 to 180 - SSI - Follow ICU  Hypo/Hyperglycemia protocol  Hypothyroidism - Follow thyroid panel - Continue home synthroid  Dementia PMHx: CVA - Provide supportive care - Promote normal sleep/wake cycle and family presence - Continue home Aricept and Namenda  Best Practice (right click and "Reselect all SmartList Selections" daily)  Diet/type: Regular consistency (see orders) DVT prophylaxis: SCD and subq lovenox  GI prophylaxis: N/A Lines: N/A Foley:  N/A Code Status:  full code Last date of multidisciplinary goals of care discussion [10/24/21]  Labs   CBC: Recent Labs  Lab 10/23/21 0451 10/23/21 1939 10/24/21 0209  WBC 8.4 15.7* 12.1*  HGB 9.1* 7.3* 9.5*  HCT 32.0* 26.1* 32.7*  MCV 79.2* 80.6 83.4  PLT 287 279 353    Basic Metabolic Panel: Recent Labs  Lab 10/23/21 0451 10/23/21 1939 10/24/21 0209  NA 145 143 146*  K 4.3 3.6 4.1  CL 103 100 105  CO2 34* 31 33*  GLUCOSE 119* 138* 119*  BUN 13 15 18   CREATININE 0.88 1.18* 1.27*  CALCIUM 7.9* 7.9* 7.7*  MG  --  1.4* 2.3  PHOS  --  4.2 5.9*   GFR: Estimated Creatinine Clearance: 44 mL/min (A) (by C-G formula based on SCr of 1.27 mg/dL (H)). Recent Labs  Lab 10/23/21 0451 10/23/21 1939 10/24/21 0017 10/24/21 0209 10/24/21 0428  PROCALCITON  --   --   --  0.63  --   WBC 8.4 15.7*  --  12.1*  --   LATICACIDVEN  --  3.9* 3.0*  --  2.5*    Liver Function Tests: Recent Labs  Lab 10/23/21 1939  ALBUMIN 2.5*   No results for input(s): "LIPASE", "AMYLASE" in the last 168 hours. No results for input(s): "AMMONIA" in the last 168 hours.  ABG    Component Value Date/Time   PHART 7.37 10/23/2021 0944   PCO2ART 66 (HH) 10/23/2021 0944   PO2ART 60 (L) 10/23/2021 0944   HCO3 38.2 (H) 10/23/2021 0944   O2SAT 90.5 10/23/2021 0944     Coagulation Profile: No results for input(s): "INR", "PROTIME" in the last 168 hours.  Cardiac Enzymes: No results for input(s): "CKTOTAL", "CKMB", "CKMBINDEX", "TROPONINI" in the last 168  hours.  HbA1C: Hgb A1c MFr Bld  Date/Time Value Ref Range Status  10/23/2021 04:51 AM 7.0 (H) 4.8 - 5.6 % Final    Comment:    (NOTE) Pre diabetes:          5.7%-6.4%  Diabetes:              >6.4%  Glycemic control for   <7.0% adults with diabetes   06/21/2020 04:08 AM 7.5 (H) 4.8 - 5.6 % Final    Comment:    (NOTE) Pre diabetes:          5.7%-6.4%  Diabetes:              >  6.4%  Glycemic control for   <7.0% adults with diabetes     CBG: Recent Labs  Lab 10/23/21 1543 10/23/21 1858 10/23/21 2055 10/24/21 0725  GLUCAP 203* 142* 138* 101*    Review of Systems:   Positives in BOLD: Gen: fever, chills, weight change, fatigue, night sweats, +pain right hip at surgical site HEENT: Denies blurred vision, double vision, hearing loss, tinnitus, sinus congestion, rhinorrhea, sore throat, neck stiffness, dysphagia PULM: Denies shortness of breath, cough, sputum production, hemoptysis, wheezing CV: Denies chest pain, edema, orthopnea, paroxysmal nocturnal dyspnea, palpitations GI: Denies abdominal pain, nausea, vomiting, diarrhea, hematochezia, melena, constipation, change in bowel habits GU: Denies dysuria, hematuria, polyuria, oliguria, urethral discharge Endocrine: Denies hot or cold intolerance, polyuria, polyphagia or appetite change Derm: Denies rash, dry skin, scaling or peeling skin change Heme: Denies easy bruising, bleeding, bleeding gums Neuro: Denies headache, numbness, weakness, slurred speech, loss of memory or consciousness, izziness  Past Medical History:  She,  has a past medical history of AAA (abdominal aortic aneurysm) (HCC), Anginal pain ([place]), Anxiety, Arthritis, Automatic implantable cardioverter-defibrillator in situ, Biventricular ICD  [employer], COPD (chronic obstructive pulmonary disease) ([place]), Deafness, Dementia ([place]), Depression, Dizziness, Dysrhythmia, Fracture, GERD (gastroesophageal reflux disease), HFrEF (heart failure with reduced ejection  fraction) ([place]), Hypertension, Hypothyroidism, Non-ischemic cardiomyopathy ([place]), Oxygen dependent, Palpitations, Shortness of breath dyspnea, Sleep apnea, and Wheezing.   Surgical History:   Past Surgical History:  Procedure Laterality Date   ABDOMINAL HYSTERECTOMY     BIV ICD GENERATOR CHANGEOUT N/A 09/17/2016   Procedure: BiV ICD Generator Changeout;  Surgeon: Deboraha Sprang, MD;  Location: [facility];  Service: Cardiovascular;  Laterality: N/A;   BLADDER SURGERY     CARDIAC CATHETERIZATION     CARDIAC DEFIBRILLATOR PLACEMENT  4 yrs ago   pacemaker   CATARACT EXTRACTION W/PHACO Left 09/01/2014   Procedure: CATARACT EXTRACTION PHACO AND INTRAOCULAR LENS PLACEMENT ([place]);  Surgeon: Lyla Glassing, MD;  Location: [facility];  Service: Ophthalmology;  Laterality: Left;  [place]: 1:12.1    CATARACT EXTRACTION W/PHACO Right 09/29/2014   Procedure: CATARACT EXTRACTION PHACO AND INTRAOCULAR LENS PLACEMENT ([facility]);  Surgeon: Lyla Glassing, MD;  Location: [facility];  Service: Ophthalmology;  Laterality: Right;  [place]: 00:52.7    CHOLECYSTECTOMY     COLON SURGERY     COLONOSCOPY     COLONOSCOPY N/A 08/31/2012   Procedure: COLONOSCOPY;  Surgeon: Inda Castle, MD;  Location: [facility];  Service: Endoscopy;  Laterality: N/A;   ESOPHAGOGASTRODUODENOSCOPY N/A 07/06/2012   Procedure: ESOPHAGOGASTRODUODENOSCOPY (EGD);  Surgeon: Lafayette Dragon, MD;  Location: [facility];  Service: Endoscopy;  Laterality: N/A;   KNEE SURGERY Right    MIDDLE EAR SURGERY     SAVORY DILATION N/A 07/06/2012   Procedure: SAVORY DILATION;  Surgeon: Lafayette Dragon, MD;  Location: [facility];  Service: Endoscopy;  Laterality: N/A;   TEE WITHOUT CARDIOVERSION N/A 06/23/2020   Procedure: TRANSESOPHAGEAL ECHOCARDIOGRAM (TEE);  Surgeon: Minna Merritts, MD;  Location: [facility];  Service: Cardiovascular;  Laterality: N/A;   WRIST SURGERY Right      Social History:   reports that she quit smoking about 19 years ago. Her smoking  use included cigarettes. She has a 60.00 pack-year smoking history. She has never used smokeless tobacco. She reports that she does not drink alcohol and does not use drugs.   Family History:  Her family history includes Breast cancer in her mother; Colon cancer (age of onset: 75) in her brother; Heart attack  in her sister and sister; Heart disease in her father; Hypertension in her brother and mother; Stroke in her mother.   Allergies Allergies  Allergen Reactions   Codeine Palpitations   Contrast Media [Iodinated Contrast Media] Rash     Home Medications  Prior to Admission medications   Medication Sig Start Date End Date Taking? Authorizing Provider  allopurinol (ZYLOPRIM) 100 MG tablet Take 300 mg by mouth daily.   Yes [provider]  atorvastatin (LIPITOR) 80 MG tablet Take 1 tablet (80 mg total) by mouth daily. 07/12/21 07/07/22 Yes Wellington Hampshire, MD  clopidogrel (PLAVIX) 75 MG tablet Take 1 tablet (75 mg total) by mouth daily. 07/12/21  Yes Wellington Hampshire, MD  digoxin (LANOXIN) 0.125 MG tablet TAKE 1 TABLET BY MOUTH ON MONDAY, [facility] AND FRIDAY 10/10/21  Yes Wellington Hampshire, MD  donepezil (ARICEPT) 10 MG tablet Take 10 mg by mouth at bedtime.   Yes [provider]  furosemide (LASIX) 20 MG tablet Take 1 tablet (20 mg total) by mouth every other day. May take an additional tablet AS NEEDED for weight gain of 3 lbs overnight or 5 lbs in one week. Do not exceed more than 3 additional doses. - Oral 10/10/21  Yes Wellington Hampshire, MD  gabapentin (NEURONTIN) 300 MG capsule Take 300 mg by mouth 2 (two) times daily as needed (RLS symptoms/ pain).    Yes [provider]  hydrOXYzine (VISTARIL) 25 MG capsule Take 25 mg by mouth at bedtime as needed for anxiety.   Yes [provider]  levothyroxine (SYNTHROID) 112 MCG tablet Take 112 mcg by mouth daily before breakfast.   Yes [provider]  losartan (COZAAR) 25 MG tablet Take 0.5 tablets (12.5 mg  total) by mouth daily. 07/12/21  Yes Wellington Hampshire, MD  memantine (NAMENDA) 10 MG tablet Take 10 mg by mouth in the morning and at bedtime. 07/10/21  Yes [provider]  metFORMIN (GLUCOPHAGE-XR) 500 MG 24 hr tablet Take 500 mg by mouth 2 (two) times daily with a meal.    Yes [provider]  metoprolol succinate (TOPROL-XL) 25 MG 24 hr tablet Take 25 mg by mouth daily.   Yes [provider]  omeprazole (PRILOSEC) 20 MG capsule Take 1 capsule (20 mg total) by mouth every morning. 04/12/18  Yes Gladstone Lighter, MD  potassium chloride SA (KLOR-CON M) 20 MEQ tablet Take 1 tablet (20 mEq total) by mouth daily. 07/12/21  Yes Wellington Hampshire, MD  venlafaxine XR (EFFEXOR-XR) 75 MG 24 hr capsule Take 75 mg by mouth 2 (two) times daily. 10/21/21  Yes [provider]  vitamin B-12 (CYANOCOBALAMIN) 1000 MCG tablet Take 1,000 mcg by mouth 2 (two) times daily.    Yes [provider]  albuterol (ACCUNEB) 0.63 MG/3ML nebulizer solution Take 1 ampule by nebulization every 6 (six) hours as needed for wheezing.    [provider]  albuterol (PROVENTIL HFA;VENTOLIN HFA) 108 (90 Base) MCG/ACT inhaler Inhale 2 puffs into the lungs every 6 (six) hours as needed. 11/02/16   Orbie Pyo, MD  budesonide-formoterol (SYMBICORT) 160-4.5 MCG/ACT inhaler Inhale 2 puffs into the lungs 2 (two) times daily.    [provider]  Menthol, Topical Analgesic, (BENGAY EX) Apply 1 application topically daily as needed (back pain).    [provider]  mirtazapine (REMERON) 7.5 MG tablet Take 7.5 mg by mouth 2 (two) times daily. 10/10/21   [provider]  Sennosides (EX-LAX PO)  Take 2 tablets by mouth at bedtime as needed (constipation).    [provider]  tiotropium (SPIRIVA) 18 MCG inhalation capsule Place 1 capsule (18 mcg total) into inhaler and inhale daily as needed (shortness of breath). 04/12/18   Gladstone Lighter, MD     Critical  care time: 37 minutes    Donell Beers, [place] Pager [phone] (please enter 7 digits) PCCM Consult Pager [phone] (please enter 7 digits)

## 2021-10-24 NOTE — Progress Notes (Signed)
Initial Nutrition Assessment  DOCUMENTATION CODES:   Obesity unspecified  INTERVENTION:   Ensure Enlive po BID, each supplement provides 350 kcal and 20 grams of protein.  MVI po daily   Liberalize diet   Pt at high refeed risk; recommend monitor potassium, magnesium and phosphorus labs daily until stable  Daily weights   NUTRITION DIAGNOSIS:   Increased nutrient needs related to post-op healing as evidenced by estimated needs.  GOAL:   Patient will meet greater than or equal to 90% of their needs  MONITOR:   PO intake, Supplement acceptance, Labs, Weight trends, Skin, I & O's  REASON FOR ASSESSMENT:   Consult Assessment of nutrition requirement/status  ASSESSMENT:   71 year old female with a past medical history significant for nonischemic cardiomyopathy with last EF of 20 to 25% status post AICD, COPD with chronic hypoxic respiratory failure requiring 3 to 4 L supplemental O2 at home, CVA, hypertension, AAA, anxiety, depression, AAA, dementia, GERD and chronic normocytic anemia who is admitted with hip fracture after fall now s/p reduction and internal fixation 9/19.  Met with pt in room today. Pt is a poor historian r/t dementia but is able to provide some history. Pt reports good appetite and oral intake at baseline. Pt reports that she ate well at breakfast but could not recall what she ate. RN reports pt with poor oral intake; pt documented to have eaten 10% of her breakfast. Pt reports that she needs salt with her food to be able to eat it. Pt reports that she is willing to try Ensure. RD discussed with pt the importance of adequate protein needed to preserve lean muscle and to support post op healing. RD will add supplements and MVI to help pt meet her estimated needs. RD will also liberalize pt's diet. Per chart, pt is up ~30lbs from her last documented weight in June; RD will order daily weights.   Medications reviewed and include: allopurinol, colace, lovenox,  insulin, synthroid, solu-medrol, protonix, levophed, phenylephrine   Labs reviewed: Na 146(H), K 4.1 wnl, creat 1.27(H), P 5.9(H), Mg 2.3 wnl Wbc- 12.1(H), Hgb 9.5(L), Hct 32.7(L), MCH 24.2(L), MCHC 29.1(L) Cbgs- 165, 101 x 24 hrs AIC 7.0(H)- 9/19  NUTRITION - FOCUSED PHYSICAL EXAM:  Flowsheet Row Most Recent Value  Orbital Region No depletion  Upper Arm Region No depletion  Thoracic and Lumbar Region No depletion  Buccal Region No depletion  Temple Region No depletion  Clavicle Bone Region No depletion  Clavicle and Acromion Bone Region No depletion  Scapular Bone Region No depletion  Dorsal Hand No depletion  Patellar Region No depletion  Anterior Thigh Region No depletion  Posterior Calf Region No depletion  Edema (RD Assessment) None  Hair Reviewed  Eyes Reviewed  Mouth Reviewed  Skin Reviewed  Nails Reviewed   Diet Order:   Diet Order             Diet regular Room service appropriate? No; Fluid consistency: Thin  Diet effective now                  EDUCATION NEEDS:   Education needs have been addressed  Skin:  Skin Assessment: Reviewed RN Assessment (incision R leg)  Last BM:  pta  Height:   Ht Readings from Last 1 Encounters:  10/23/21 '5\' 3"'  (1.6 m)    Weight:   Wt Readings from Last 1 Encounters:  10/23/21 95.3 kg    Ideal Body Weight:  52.3 kg  BMI:  Body mass index  is 37.22 kg/m.  Estimated Nutritional Needs:   Kcal:  1600-1800kcal/day  Protein:  80-90g/day  Fluid:  1.3-1.5L/day  Koleen Distance MS, RD, LDN Please refer to [facility] for RD and/or RD on-call/weekend/after hours pager

## 2021-10-24 NOTE — Evaluation (Addendum)
Occupational Therapy Evaluation Patient Details Name: Stacy Grimes MRN: [id number] DOB: [birth year] Today's Date: 10/24/2021   History of Present Illness Stacy Grimes is a 71 yo female s/p hip IM nailing due to a fall. Transferred to ICU for hypotension. PMH of HTN, CHF, COPD, cardiac defibrillator, DM, AAA, dementia, spinal fx, O2 use, CVA, L ear deafness.   Clinical Impression   Stacy Grimes was seen for OT evaluation this date. Prior to hospital admission, Stacy Grimes was MOD I for mobility using SPC as needed. Stacy Grimes lives with spouse in mobile home c [address]. Stacy Grimes presents to acute OT demonstrating impaired ADL performance and functional mobility 2/2 decreased activity tolerance and functional strength/ROM/balance deficits. Stacy Grimes currently requires MAX A exit bed, CGA hair brushing seated EOB. Stacy Grimes expresses fear of falling but appears at ease with spouse in room. MAX A + RW sit<>stand, tolerates ~30 sec, requires R knee block. Noted to have wet sheets, MOD A x2 for L+R rolling at bed level, RN in to assess IV site after rolling. Stacy Grimes would benefit from skilled OT to address noted impairments and functional limitations (see below for any additional details). Upon hospital discharge, recommend STR to maximize Stacy Grimes safety and return to PLOF.     Recommendations for follow up therapy are one component of a multi-disciplinary discharge planning process, led by the attending physician.  Recommendations may be updated based on patient status, additional functional criteria and insurance authorization.   Follow Up Recommendations  Skilled nursing-short term rehab (<3 hours/day)    Assistance Recommended at Discharge Frequent or constant Supervision/Assistance  Patient can return home with the following A lot of help with walking and/or transfers;A lot of help with bathing/dressing/bathroom    Functional Status Assessment  Patient has had a recent decline in their functional status and demonstrates the ability to make significant  improvements in function in a reasonable and predictable amount of time.  Equipment Recommendations  Hospital bed    Recommendations for Other Services       Precautions / Restrictions Precautions Precautions: Fall Restrictions Weight Bearing Restrictions: Yes RLE Weight Bearing: Weight bearing as tolerated      Mobility Bed Mobility Overal bed mobility: Needs Assistance Bed Mobility: Supine to Sit, Sit to Supine     Supine to sit: Max assist, HOB elevated Sit to supine: Total assist        Transfers Overall transfer level: Needs assistance Equipment used: Rolling walker (2 wheels) Transfers: Sit to/from Stand Sit to Stand: Max assist, +2 safety/equipment           General transfer comment: spouse stablized RW, MAX A + R knee block      Balance Overall balance assessment: Needs assistance Sitting-balance support: Feet supported Sitting balance-Leahy Scale: Fair     Standing balance support: Bilateral upper extremity supported, Reliant on assistive device for balance Standing balance-Leahy Scale: Poor                             ADL either performed or assessed with clinical judgement   ADL Overall ADL's : Needs assistance/impaired                                       General ADL Comments: CGA hair brushing seated EOB. MAX A don B socks bed level      Pertinent Vitals/Pain Pain Assessment Pain  Assessment: Faces Faces Pain Scale: Hurts even more Pain Location: RLE with movement Pain Descriptors / Indicators: Aching, Guarding, Grimacing, Moaning Pain Intervention(s): Limited activity within patient's tolerance, Repositioned     Hand Dominance     Extremity/Trunk Assessment Upper Extremity Assessment Upper Extremity Assessment: Generalized weakness   Lower Extremity Assessment Lower Extremity Assessment: Generalized weakness       Communication Communication Communication: No difficulties   Cognition  Arousal/Alertness: Awake/alert Behavior During Therapy: WFL for tasks assessed/performed Overall Cognitive Status: History of cognitive impairments - at baseline                                                  Home Living Family/patient expects to be discharged to:: Private residence Living Arrangements: Spouse/significant other Available Help at Discharge: Family;Available 24 hours/day Type of Home: Mobile home Home Access: Stairs to enter Entrance Stairs-Number of Steps: 5 Entrance Stairs-Rails: Right;Left Home Layout: One level     Bathroom Shower/Tub: Teacher, early years/pre: Handicapped height     Home Equipment: Conservation officer, nature (2 wheels);Cane - single point;Rollator (4 wheels);BSC/3in1;Electric scooter   Additional Comments: setup per spouse, looking into ramp      Prior Functioning/Environment Prior Level of Function : Independent/Modified Independent             Mobility Comments: MOD I using SPC as needed for limited household distances          OT Problem List: Decreased strength;Decreased range of motion;Decreased activity tolerance;Impaired balance (sitting and/or standing);Decreased safety awareness      OT Treatment/Interventions: Self-care/ADL training;Therapeutic exercise;Energy conservation;DME and/or AE instruction;Therapeutic activities;Patient/family education;Balance training    OT Goals(Current goals can be found in the care plan section) Acute Rehab OT Goals Patient Stated Goal: to go home OT Goal Formulation: With patient/family Time For Goal Achievement: 11/07/21 Potential to Achieve Goals: Good ADL Goals Stacy Grimes Will Perform Grooming: with modified independence;sitting Stacy Grimes Will Perform Lower Body Dressing: with min assist;with caregiver independent in assisting;sitting/lateral leans Stacy Grimes Will Transfer to Toilet: with mod assist;stand pivot transfer;bedside commode  OT Frequency: Min 2X/week    Co-evaluation               AM-PAC OT "6 Clicks" Daily Activity     Outcome Measure Help from another person eating meals?: A Little Help from another person taking care of personal grooming?: A Little Help from another person toileting, which includes using toliet, bedpan, or urinal?: A Lot Help from another person bathing (including washing, rinsing, drying)?: A Lot Help from another person to put on and taking off regular upper body clothing?: A Lot Help from another person to put on and taking off regular lower body clothing?: A Lot 6 Click Score: 14   End of Session Equipment Utilized During Treatment: Gait belt;Rolling walker (2 wheels) Nurse Communication: Mobility status  Activity Tolerance: Patient tolerated treatment well Patient left: in bed;with call bell/phone within reach;with bed alarm set;with family/visitor present  OT Visit Diagnosis: Repeated falls (R29.6);Muscle weakness (generalized) (M62.81)                Time: 0272-5366 OT Time Calculation (min): 34 min Charges:  OT General Charges $OT Visit: 1 Visit OT Evaluation $OT Eval Moderate Complexity: 1 Mod OT Treatments $Self Care/Home Management : 8-22 mins  Dessie Coma, M.S. OTR/L  10/24/21, 2:24 PM  ascom  [phone]  

## 2021-10-24 NOTE — Evaluation (Signed)
Physical Therapy Evaluation Patient Details Name: Stacy Grimes MRN: [id number] DOB: March 21, 1950 Today's Date: 10/24/2021  History of Present Illness  Pt is a 71 yo female s/p hip IM nailing due to a fall. Transferred to ICU for hypotension. PMH of HTN, CHF, COPD, cardiac defibrillator, DM, AAA, dementia, spinal fx, O2 use, CVA, L ear deafness.   Clinical Impression  Patient alert, but demonstrated decreased situational awareness as well as short term memory. Provided some PLOF, per chart from a previous admission somewhat accurate. Pt ambulatory with AD at baseline, lives with her husband and modI/I for ADLs.  She was able to perform a few supine exercises, unable to move RLE without assistance. Supine to sit with maxA and bed rails, extra time and encouragement. Fair sitting balance noted. Sit <> stand with RW and maxAx2, pt very fearful and self limiting. No true ambulation but did take a few very small/shuffled steps towards HOB with constant maxAx2 and RW assist. Returned to supine maxAx2.  Overall the patient demonstrated deficits (see "PT Problem List") that impede the patient's functional abilities, safety, and mobility and would benefit from skilled PT intervention. Recommendation at this time is SNF due to current level of assistance needed and change from PLOF.        Recommendations for follow up therapy are one component of a multi-disciplinary discharge planning process, led by the attending physician.  Recommendations may be updated based on patient status, additional functional criteria and insurance authorization.  Follow Up Recommendations Skilled nursing-short term rehab (<3 hours/day) Can patient physically be transported by private vehicle: No    Assistance Recommended at Discharge Frequent or constant Supervision/Assistance  Patient can return home with the following  Two people to help with walking and/or transfers;Two people to help with bathing/dressing/bathroom;Help  with stairs or ramp for entrance;Assistance with feeding;Assist for transportation;Direct supervision/assist for financial management;Assistance with cooking/housework    Equipment Recommendations Other (comment) (TBD)  Recommendations for Other Services       Functional Status Assessment Patient has had a recent decline in their functional status and demonstrates the ability to make significant improvements in function in a reasonable and predictable amount of time.     Precautions / Restrictions Precautions Precautions: Fall Restrictions Weight Bearing Restrictions: Yes RLE Weight Bearing: Weight bearing as tolerated      Mobility  Bed Mobility Overal bed mobility: Needs Assistance Bed Mobility: Supine to Sit, Sit to Supine     Supine to sit: Max assist, HOB elevated Sit to supine: Max assist, +2 for physical assistance        Transfers Overall transfer level: Needs assistance Equipment used: Rolling walker (2 wheels) Transfers: Sit to/from Stand Sit to Stand: Max assist, +2 physical assistance           General transfer comment: max encouragemente as well    Ambulation/Gait               General Gait Details: unable to truly ambulate; a few steps maxAx2 at [employer]            Wheelchair Mobility    Modified Rankin (Stroke Patients Only)       Balance Overall balance assessment: Needs assistance Sitting-balance support: Feet supported Sitting balance-Leahy Scale: Fair     Standing balance support: Reliant on assistive device for balance Standing balance-Leahy Scale: Zero Standing balance comment: did progress to poor balance momentarily but very fearful/self limiting  Pertinent Vitals/Pain Pain Assessment Pain Assessment: Faces Faces Pain Scale: Hurts even more Pain Location: R leg when attempting mobility Pain Descriptors / Indicators: Aching, Guarding, Grimacing, Moaning Pain  Intervention(s): Limited activity within patient's tolerance, Monitored during session, Premedicated before session, Repositioned    Home Living Family/patient expects to be discharged to:: Private residence Living Arrangements: Spouse/significant other Available Help at Discharge: Family;Available 24 hours/day Type of Home: Mobile home Home Access: Stairs to enter Entrance Stairs-Rails: Right;Left Entrance Stairs-Number of Steps: 3-4 (pt reported level entry however)   Home Layout: One level Home Equipment: Conservation officer, nature (2 wheels);Cane - single point      Prior Function Prior Level of Function : Patient poor historian/Family not available;Independent/Modified Independent                     Hand Dominance        Extremity/Trunk Assessment   Upper Extremity Assessment Upper Extremity Assessment: Generalized weakness    Lower Extremity Assessment Lower Extremity Assessment: Generalized weakness (unable to lift RLE without assistance)       Communication      Cognition Arousal/Alertness: Awake/alert Behavior During Therapy: WFL for tasks assessed/performed Overall Cognitive Status: No family/caregiver present to determine baseline cognitive functioning                                 General Comments: pt oriented to self and hospital, decreased situational awareness as well as short term memory noted        General Comments      Exercises General Exercises - Lower Extremity Ankle Circles/Pumps: AROM, Both, 10 reps Heel Slides: AAROM, Strengthening, Right, 10 reps Hip ABduction/ADduction: AAROM, Right, Strengthening, 10 reps   Assessment/Plan    PT Assessment Patient needs continued PT services  PT Problem List Decreased strength;Decreased mobility;Decreased range of motion;Decreased coordination;Decreased activity tolerance;Decreased balance;Pain;Decreased knowledge of use of DME       PT Treatment Interventions DME  instruction;Therapeutic exercise;Gait training;Balance training;Stair training;Neuromuscular re-education;Functional mobility training;Therapeutic activities;Patient/family education    PT Goals (Current goals can be found in the Care Plan section)  Acute Rehab PT Goals Patient Stated Goal: to have less pain PT Goal Formulation: With patient Time For Goal Achievement: 11/07/21 Potential to Achieve Goals: Good    Frequency 7X/week     Co-evaluation               AM-PAC PT "6 Clicks" Mobility  Outcome Measure Help needed turning from your back to your side while in a flat bed without using bedrails?: A Lot Help needed moving from lying on your back to sitting on the side of a flat bed without using bedrails?: A Lot Help needed moving to and from a bed to a chair (including a wheelchair)?: Total Help needed standing up from a chair using your arms (e.g., wheelchair or bedside chair)?: A Lot Help needed to walk in hospital room?: Total Help needed climbing 3-5 steps with a railing? : Total 6 Click Score: 9    End of Session Equipment Utilized During Treatment: Gait belt Activity Tolerance: Other (comment) (limited by fear, activity tolerance) Patient left: in bed;with call bell/phone within reach;with bed alarm set;with nursing/sitter in room Nurse Communication: Mobility status PT Visit Diagnosis: Other abnormalities of gait and mobility (R26.89);Difficulty in walking, not elsewhere classified (R26.2);Muscle weakness (generalized) (M62.81);Pain Pain - Right/Left: Right Pain - part of body: Hip    Time: 1010-1039 PT  Time Calculation (min) (ACUTE ONLY): 29 min   Charges:   PT Evaluation $PT Eval Moderate Complexity: 1 Mod PT Treatments $Therapeutic Exercise: 8-22 mins $Therapeutic Activity: 8-22 mins        Lieutenant Diego PT, DPT 10:54 AM,10/24/21

## 2021-10-24 NOTE — Progress Notes (Signed)
Husband updated by this RN regarding BP. Husband understands patient's BP is improving with pressure medication support and fluid resuscitation. Patient had full linen change and setup for dinner in bed with all items within reach. Patient expressed she is comfortable and has no unmeet needs at this time. This RN will continue to assess closely.

## 2021-10-24 NOTE — Progress Notes (Signed)
PT Cancellation Note  Patient Details Name: Stacy Grimes MRN: [id number] DOB: 1950-12-30   Cancelled Treatment:    Reason Eval/Treat Not Completed: Other (comment). Pt just starting breakfast PT to re-attempt as able.    Lieutenant Diego PT, DPT 9:11 AM,10/24/21

## 2021-10-24 NOTE — Progress Notes (Signed)
Subjective: 1 Day Post-Op Procedure(s) (LRB): INTRAMEDULLARY (IM) NAIL INTERTROCHANTERIC (Right) Patient reports pain as mild.   Patient is currently admitted to the ICU for continued monitoring of hypotension. Patient is on chronic O2, requiring 3-4L at baseline. PT and care management to assist with discharge planning following surgery. Negative for chest pain but is SOB. Fever: no Gastrointestinal:Negative for nausea and vomiting Reports that she is hungry this morning, reports she is passing gas.  Objective: Vital signs in last 24 hours: Temp:  [97 F (36.1 C)-98.4 F (36.9 C)] 98.2 F (36.8 C) (09/20 0600) Pulse Rate:  [86-139] 94 (09/20 0630) Resp:  [9-26] 14 (09/20 0630) BP: (47-129)/(22-96) 126/65 (09/20 0630) SpO2:  [87 %-100 %] 100 % (09/20 0630) Weight:  [95.3 kg] 95.3 kg (09/19 1855)  Intake/Output from previous day:  Intake/Output Summary (Last 24 hours) at 10/24/2021 0815 Last data filed at 10/24/2021 0600 Gross per 24 hour  Intake 2656.21 ml  Output 725 ml  Net 1931.21 ml    Intake/Output this shift: No intake/output data recorded.  Labs: Recent Labs    10/23/21 0451 10/23/21 1939 10/24/21 0209  HGB 9.1* 7.3* 9.5*   Recent Labs    10/23/21 1939 10/24/21 0209  WBC 15.7* 12.1*  RBC 3.24* 3.92  HCT 26.1* 32.7*  PLT 279 247   Recent Labs    10/23/21 1939 10/24/21 0209  NA 143 146*  K 3.6 4.1  CL 100 105  CO2 31 33*  BUN 15 18  CREATININE 1.18* 1.27*  GLUCOSE 138* 119*  CALCIUM 7.9* 7.7*   No results for input(s): "LABPT", "INR" in the last 72 hours.   EXAM General - Patient is Alert, Appropriate, and Oriented Extremity - ABD soft Sensation intact distally Dorsiflexion/Plantar flexion intact Incision: dressing C/D/I No cellulitis present Compartment soft Dressing/Incision - honeycomb dressings intact without drainage to the right leg this morning. Motor Function - intact, moving foot and toes well on exam.  Abdomen soft with  intact bowel sounds.  Past Medical History:  Diagnosis Date   AAA (abdominal aortic aneurysm) (HCC)    Anginal pain ([place])    Anxiety    Arthritis    Automatic implantable cardioverter-defibrillator in situ    Biventricular ICD  [facility]    COPD (chronic obstructive pulmonary disease) (HCC)    Deafness    left ear   Dementia (HCC)    Depression    Dizziness    Dysrhythmia    Fracture    history of spinal fracture   GERD (gastroesophageal reflux disease)    HFrEF (heart failure with reduced ejection fraction) ([place])    a. 2010 Cath: nonobs dzs, EF 15-20%; b. 09/2017 Echo: EF suboptimal. EF low nl; c. 04/2018 Echo: EF 40-45%.   Hypertension    Hypothyroidism    Non-ischemic cardiomyopathy ([name])    a. 2010 Cath: nonobs dzs, EF 15-20%; b. 2011 s/p SJM CRT-D; c. 09/2016 s/p Gen change; d. 09/2017 Echo: EF suboptimal. EF low nl; e. 04/2018 Echo: EF 40-45%.   Oxygen dependent    Palpitations    Shortness of breath dyspnea    Sleep apnea    Wheezing     Assessment/Plan: 1 Day Post-Op Procedure(s) (LRB): INTRAMEDULLARY (IM) NAIL INTERTROCHANTERIC (Right) Principal Problem:   Hip fracture (HCC) Active Problems:   Hip fracture, unspecified laterality, closed, initial encounter ([place])   CHF (congestive heart failure) (HCC)   Shock circulatory (HCC)  Estimated body mass index is 37.22 kg/m as calculated from the following:  Height as of this encounter: 5\' 3"  (1.6 m).   Weight as of this encounter: 95.3 kg. Advance diet Up with therapy  Labs and vitals reviewed, Hg 9.5 this morning. Patient currently in the ICU for monitoring of hypotension. Improving. Up with therapy today as tolerated. Will likely need SNF at discharge.  DVT Prophylaxis - Lovenox and TED hose Weight-Bearing as tolerated to right leg  J. Cameron Proud, PA-C [facility] 10/24/2021, 8:15 AM

## 2021-10-24 NOTE — Progress Notes (Addendum)
MD Kasa changed patient from Neo to Levo during morning rounds. MAP goal >65 in orders/SBP >90. BP trending appropriately to titrations.  Patient required re-orientation to situation frequently.   PIV removed accidentally during OT evaluation.   Husband at bedside to visit patient throughout the day offering support.

## 2021-10-24 NOTE — Anesthesia Postprocedure Evaluation (Signed)
Anesthesia Post Note  Patient: Stacy Grimes  Procedure(s) Performed: INTRAMEDULLARY (IM) NAIL INTERTROCHANTERIC (Right: Hip)  Patient location during evaluation: ICU Anesthesia Type: General Pain management: pain level controlled Vital Signs Assessment: post-procedure vital signs reviewed and stable Respiratory status: spontaneous breathing and respiratory function stable Cardiovascular status: stable Postop Assessment: no apparent nausea or vomiting Anesthetic complications: no   No notable events documented.   Last Vitals:  Vitals:   10/24/21 1040 10/24/21 1120  BP: (!) 163/140 (!) 104/53  Pulse: (!) 103 95  Resp: 20 (!) 24  Temp:    SpO2: 91% 96%    Last Pain:  Vitals:   10/24/21 1000  TempSrc:   PainSc: 3                  Ky Rumple,  Clearnce Sorrel

## 2021-10-25 DIAGNOSIS — E870 Hyperosmolality and hypernatremia: Secondary | ICD-10-CM

## 2021-10-25 DIAGNOSIS — J9622 Acute and chronic respiratory failure with hypercapnia: Secondary | ICD-10-CM

## 2021-10-25 DIAGNOSIS — F0390 Unspecified dementia without behavioral disturbance: Secondary | ICD-10-CM

## 2021-10-25 DIAGNOSIS — E669 Obesity, unspecified: Secondary | ICD-10-CM

## 2021-10-25 DIAGNOSIS — D638 Anemia in other chronic diseases classified elsewhere: Secondary | ICD-10-CM

## 2021-10-25 DIAGNOSIS — S72001A Fracture of unspecified part of neck of right femur, initial encounter for closed fracture: Secondary | ICD-10-CM | POA: Diagnosis not present

## 2021-10-25 DIAGNOSIS — E039 Hypothyroidism, unspecified: Secondary | ICD-10-CM

## 2021-10-25 DIAGNOSIS — R579 Shock, unspecified: Secondary | ICD-10-CM | POA: Diagnosis not present

## 2021-10-25 DIAGNOSIS — I959 Hypotension, unspecified: Secondary | ICD-10-CM

## 2021-10-25 DIAGNOSIS — S72001D Fracture of unspecified part of neck of right femur, subsequent encounter for closed fracture with routine healing: Secondary | ICD-10-CM | POA: Diagnosis not present

## 2021-10-25 DIAGNOSIS — S72009A Fracture of unspecified part of neck of unspecified femur, initial encounter for closed fracture: Secondary | ICD-10-CM | POA: Diagnosis not present

## 2021-10-25 DIAGNOSIS — D72829 Elevated white blood cell count, unspecified: Secondary | ICD-10-CM

## 2021-10-25 DIAGNOSIS — E119 Type 2 diabetes mellitus without complications: Secondary | ICD-10-CM

## 2021-10-25 DIAGNOSIS — E872 Acidosis, unspecified: Secondary | ICD-10-CM

## 2021-10-25 DIAGNOSIS — J9621 Acute and chronic respiratory failure with hypoxia: Secondary | ICD-10-CM

## 2021-10-25 DIAGNOSIS — I5021 Acute systolic (congestive) heart failure: Secondary | ICD-10-CM | POA: Diagnosis not present

## 2021-10-25 DIAGNOSIS — Z9981 Dependence on supplemental oxygen: Secondary | ICD-10-CM

## 2021-10-25 LAB — CBC WITH DIFFERENTIAL/PLATELET
Abs Immature Granulocytes: 0.08 10*3/uL — ABNORMAL HIGH (ref 0.00–0.07)
Basophils Absolute: 0 10*3/uL (ref 0.0–0.1)
Basophils Relative: 0 %
Eosinophils Absolute: 0 10*3/uL (ref 0.0–0.5)
Eosinophils Relative: 0 %
HCT: 26.7 % — ABNORMAL LOW (ref 36.0–46.0)
Hemoglobin: 7.9 g/dL — ABNORMAL LOW (ref 12.0–15.0)
Immature Granulocytes: 1 %
Lymphocytes Relative: 7 %
Lymphs Abs: 0.9 10*3/uL (ref 0.7–4.0)
MCH: 24.8 pg — ABNORMAL LOW (ref 26.0–34.0)
MCHC: 29.6 g/dL — ABNORMAL LOW (ref 30.0–36.0)
MCV: 83.7 fL (ref 80.0–100.0)
Monocytes Absolute: 1 10*3/uL (ref 0.1–1.0)
Monocytes Relative: 8 %
Neutro Abs: 10.7 10*3/uL — ABNORMAL HIGH (ref 1.7–7.7)
Neutrophils Relative %: 84 %
Platelets: 190 10*3/uL (ref 150–400)
RBC: 3.19 MIL/uL — ABNORMAL LOW (ref 3.87–5.11)
RDW: 19.9 % — ABNORMAL HIGH (ref 11.5–15.5)
WBC: 12.7 10*3/uL — ABNORMAL HIGH (ref 4.0–10.5)
nRBC: 0 % (ref 0.0–0.2)

## 2021-10-25 LAB — BASIC METABOLIC PANEL
Anion gap: 8 (ref 5–15)
BUN: 19 mg/dL (ref 8–23)
CO2: 30 mmol/L (ref 22–32)
Calcium: 7.7 mg/dL — ABNORMAL LOW (ref 8.9–10.3)
Chloride: 104 mmol/L (ref 98–111)
Creatinine, Ser: 0.96 mg/dL (ref 0.44–1.00)
GFR, Estimated: >60 mL/min (ref >60)
Glucose, Bld: 117 mg/dL — ABNORMAL HIGH (ref 70–99)
Potassium: 4 mmol/L (ref 3.5–5.1)
Sodium: 142 mmol/L (ref 135–145)

## 2021-10-25 LAB — GLUCOSE, CAPILLARY
Glucose-Capillary: 86 mg/dL (ref 70–99)
Glucose-Capillary: 204 mg/dL — ABNORMAL HIGH (ref 70–99)
Glucose-Capillary: 219 mg/dL — ABNORMAL HIGH (ref 70–99)
Glucose-Capillary: 132 mg/dL — ABNORMAL HIGH (ref 70–99)

## 2021-10-25 LAB — PHOSPHORUS: Phosphorus: 3.2 mg/dL (ref 2.5–4.6)

## 2021-10-25 LAB — MAGNESIUM: Magnesium: 2.6 mg/dL — ABNORMAL HIGH (ref 1.7–2.4)

## 2021-10-25 LAB — PROCALCITONIN: Procalcitonin: 0.52 ng/mL

## 2021-10-25 MED ORDER — MIRTAZAPINE 15 MG PO TABS
7.5000 mg | ORAL_TABLET | Freq: Two times a day (BID) | ORAL | Status: DC
  Administered 2021-10-25 – 2021-10-30 (×11): 7.5 mg via ORAL
  Filled 2021-10-25 (×11): qty 1

## 2021-10-25 MED ORDER — METOPROLOL TARTRATE 25 MG PO TABS
12.5000 mg | ORAL_TABLET | Freq: Two times a day (BID) | ORAL | Status: DC
  Administered 2021-10-25 – 2021-10-30 (×11): 12.5 mg via ORAL
  Filled 2021-10-25 (×11): qty 1

## 2021-10-25 MED ORDER — MIDODRINE HCL 5 MG PO TABS
5.0000 mg | ORAL_TABLET | Freq: Three times a day (TID) | ORAL | Status: DC
  Administered 2021-10-25 (×2): 5 mg via ORAL
  Filled 2021-10-25 (×2): qty 1

## 2021-10-25 MED ORDER — CLOPIDOGREL BISULFATE 75 MG PO TABS
75.0000 mg | ORAL_TABLET | Freq: Every day | ORAL | Status: DC
  Administered 2021-10-25 – 2021-10-30 (×6): 75 mg via ORAL
  Filled 2021-10-25 (×6): qty 1

## 2021-10-25 MED ORDER — PREDNISONE 20 MG PO TABS
40.0000 mg | ORAL_TABLET | Freq: Every day | ORAL | Status: DC
  Administered 2021-10-25 – 2021-10-28 (×4): 40 mg via ORAL
  Filled 2021-10-25 (×4): qty 2

## 2021-10-25 NOTE — Progress Notes (Signed)
Patient more intermittently confused today, continues to ask why she is at the hospital. Easily redirectable. Appetite increased from yesterday. Husband at bedside throughout the afternoon visiting with patient. CCMD notified this Rn patient's HR elevated to 150s without notation of pacing.  MD Sreenath notified by this RN and new orders placed for metoprolol and change to administration of midodrine placed.

## 2021-10-25 NOTE — Progress Notes (Addendum)
NAME:  Stacy Grimes, MRN:  [id number], DOB:  August 05, 1950, LOS: 2 ADMISSION DATE:  10/23/2021, CONSULTATION DATE:  10/23/2021 REFERRING MD:  Dr. Roosevelt Locks, CHIEF COMPLAINT:  Hypotension   Brief Pt Description / Synopsis:  71 year old female with past medical history most notable for ischemic cardiomyopathy with EF 20 to 25% and COPD requiring 3 to 4 L baseline supplemental oxygen at home, admitted with Closed displaced comminuted intertrochanteric/basicervical fracture of right hip status post fall at home.  Underwent reduction and internal fixation of displaced right hip fracture, postop with hypotension and hypoxia.  History of Present Illness:  Stacy Grimes is a 71 year old female with a past medical history significant for nonischemic cardiomyopathy with last EF of 20 to 25% status post AICD, COPD with chronic hypoxic respiratory failure requiring 3 to 4 L supplemental O2 at home, CVA, hypertension, AAA, chronic normocytic anemia who presented to [facility] ED on 10/23/2021 status post fall with associated right hip pain.  She reported she occasionally has episodes where she feels her right leg gives up with near falls.  Earlier this morning when she went to the bathroom while trying to stand up from the toilet bowl she developed right leg weakness and fell on her right side hitting her hip.  She denied lightheadedness, dizziness, chest pain, shortness of breath.  She did report that over the weekend she developed a new nonproductive cough and wheezing with slight increase in oxygen requirements up to 5 L.  She denied chest pain, palpitations, shortness of breath, fever, chills, abdominal pain, nausea, vomiting, dysuria.  ED Course: Initial Vital Signs: Temperature 97.8 F orally, respiratory rate 21, pulse 89, blood pressure 127/66, SPO2 91% on 3 L nasal cannula Significant Labs: Bicarbonate 34, glucose 119, high-sensitivity troponin 8, WBC 8.4, hemoglobin 9.1, hematocrit 32, MCV 79.2, MCH 22.5,  hemoglobin A1c 7.0 ABG: pH 7.37/PCO2 66/PO2 60/bicarb 38.2 Imaging Chest X-ray>>IMPRESSION: No acute cardiopulmonary findings. X-ray right foot>>IMPRESSION: No acute bony findings CT right hip>>IMPRESSION: Acute comminuted fracture of the proximal right femur with intertrochanteric and basicervical components.  Hospitalist were asked to admit the patient for further work-up and treatment.  Orthopedics was consulted, with plans to go to the OR for ORIF.  Please see "significant hospital events" section below for full detailed hospital course.  Pertinent  Medical History   Past Medical History:  Diagnosis Date   AAA (abdominal aortic aneurysm) (HCC)    Anginal pain ([place])    Anxiety    Arthritis    Automatic implantable cardioverter-defibrillator in situ    Biventricular ICD  [employer]    COPD (chronic obstructive pulmonary disease) ([place])    Deafness    left ear   Dementia (HCC)    Depression    Dizziness    Dysrhythmia    Fracture    history of spinal fracture   GERD (gastroesophageal reflux disease)    HFrEF (heart failure with reduced ejection fraction) ([place])    a. 2010 Cath: nonobs dzs, EF 15-20%; b. 09/2017 Echo: EF suboptimal. EF low nl; c. 04/2018 Echo: EF 40-45%.   Hypertension    Hypothyroidism    Non-ischemic cardiomyopathy ([place])    a. 2010 Cath: nonobs dzs, EF 15-20%; b. 2011 s/p SJM CRT-D; c. 09/2016 s/p Gen change; d. 09/2017 Echo: EF suboptimal. EF low nl; e. 04/2018 Echo: EF 40-45%.   Oxygen dependent    Palpitations    Shortness of breath dyspnea    Sleep apnea    Wheezing    Micro  Data:  N/A  Antimicrobials:  Cefazolin 9/19 (surgical prophylaxis)>>  Significant Hospital Events: Including procedures, antibiotic start and stop dates in addition to other pertinent events   9/19: Admitted by hospitalist for right hip fracture.  Ortho consulted, taken to the OR for ORIF. 9/19: Intra-Op with hypotension, estimated blood loss 200 cc requiring phenylephrine.   Postop with hypoxia, chest x-ray concerning for pulmonary edema.  Transferred to stepdown, PCCM consulted for assistance with hypotension. 9/20: Pt remains on neo-synephrine gtt @70  mcg/min.  Received 1 unit of pRBC's on 09/19 for hgb 7.3 post transfusion hgb 9.5 9/20: Pt off vasopressor therapy PCCM team will sign off   Interim History / Subjective:  As outlined above   Objective   Blood pressure (!) 135/53, pulse 91, temperature 98.2 F (36.8 C), temperature source Oral, resp. rate 13, height 5\' 3"  (1.6 m), weight 95.8 kg, SpO2 99 %.        Intake/Output Summary (Last 24 hours) at 10/25/2021 0805 Last data filed at 10/25/2021 0600 Gross per 24 hour  Intake 1865.15 ml  Output 1200 ml  Net 665.15 ml   Filed Weights   10/23/21 1002 10/23/21 1855 10/25/21 0259  Weight: 95.3 kg 95.3 kg 95.8 kg    Examination: General: Acute on chronically ill-appearing female, on 5L O2 via nasal canula NAD  HENT: Atraumatic, normocephalic, neck supple, no JVD  Lungs: Clear throughout, even, non labored  Cardiovascular: Paced rhythm, 2+ radial/2+ distal pulses, no edema  Abdomen: +BS x4, obese, soft, non tender, non distended  Extremities: Right hip with honeycomb dressing clean dry and intact, trace edema bilateral lower extremities Neuro: Awake and alert, disoriented to time and situation at times, very hard of hearing, moves all extremities to command, no focal deficits noted, speech clear GU: External female catheter in place  Resolved Hospital Problem list     Assessment & Plan:   Hypotension: Suspect hypovolemia due to acute blood loss Intra-Op +/- Cardiogenic +/- related to pain medication/anesthesia Acute on chronic HFrEF Elevated Troponin secondary to N-STEMI vs [place] Ischemia prolonged Qtc in the setting of hypomagesemia PMHx: Ischemic cardiomyopathy with EF 20 to 25% s/p AICD, paroxysmal atrial fibrillation, HTN, AAA TEE from 09/21/20 with LVEF 20/25%, moderately reduced RV function.   Echo 10/23/21: EF 40 to 45%; severely elevated pulmonary artery pressure  BNP: 316.7 >> 20 mg of  Lasix given in PACU - Continuous cardiac monitoring - Cautious IV fluids - Transfusions as indicated - Maintain MAP 65 or higher  - Continue midodrine  - Trend lactic acid until normalized - Troponin peaked at 246> systemic anticoagulation will need to be cleared by Ortho - Diuresis as BP and renal function permits - Continue to hold outpatient beta blocker due to hypotension  - Avoid Qtc prolonging medications  Acute on chronic hypoxic and hypercapnic respiratory failure in the setting of acute decompensated HFrEF PMHx: COPD requiring 3 to 4 L supplemental oxygen at baseline, OSA - Supplemental O2 as needed to maintain O2 sats 88 to 92% - Prn Bipap - Follow intermittent Chest X-ray & ABG as needed - Bronchodilators an needed; continue home [place] and [place] - Continue prednisone 40 mg daily  - Diuresis as BP and renal function permits  - Pulmonary toilet as able  Leukocytosis~improving  Lactic Acidosis - Trend WBC and monitor fever curve  - Trend PCT  - No signs of active infection no indication for abx therapy at this time  - Gentle IVF resuscitation due to respiratory status red  Acute Kidney Injury secondary to ATN~resolved   Mild hypernatremia  Baseline Cr: 0.88, Cr on admission: 1.18 - Trend BMP  - Replace electrolytes as indicated  - Monitor UOP - Avoid nephrotoxic agents as able, ensure adequate renal perfusion  Closed displaced comminuted intertrochanteric/basicervical fracture of right hip. S/p ORIF on 9/19 - Orthopedics following, appreciate input - Continue current pain regimen: Dilaudid, Norco, Toradol  Anemia of chronic disease superimposed on Acute blood loss anemia intra-op~improving s/p 1 unit pRBC's 09/19 - Monitor for S/Sx of bleeding - SCD's and subq lovenox for VTE px if hgb continues to trend down will discontinue lovenox  - Transfuse for Hgb  <7  Diabetes Mellitus Type II Hgb A1c 7.0 - CBG's ac/hs; Target range of 140 to 180 - SSI - Follow ICU Hypo/Hyperglycemia protocol  Obesity - PT/OT when deemed appropriate by ortho  Hypothyroidism - Continue home synthroid  Dementia PMHx: CVA - Provide supportive care - Promote normal sleep/wake cycle and family presence - Continue home Aricept and Namenda  Best Practice (right click and "Reselect all SmartList Selections" daily)  Diet/type: Regular consistency (see orders) DVT prophylaxis: SCD and subq lovenox  GI prophylaxis: Protonix  Lines: N/A Foley:  N/A Code Status:  full code Last date of multidisciplinary goals of care discussion [10/25/21]  Labs   CBC: Recent Labs  Lab 10/23/21 0451 10/23/21 1939 10/24/21 0209 10/25/21 0326  WBC 8.4 15.7* 12.1* 12.7*  NEUTROABS  --   --   --  10.7*  HGB 9.1* 7.3* 9.5* 7.9*  HCT 32.0* 26.1* 32.7* 26.7*  MCV 79.2* 80.6 83.4 83.7  PLT 287 279 247 678    Basic Metabolic Panel: Recent Labs  Lab 10/23/21 0451 10/23/21 1939 10/24/21 0209 10/25/21 0326  NA 145 143 146* 142  K 4.3 3.6 4.1 4.0  CL 103 100 105 104  CO2 34* 31 33* 30  GLUCOSE 119* 138* 119* 117*  BUN 13 15 18 19   CREATININE 0.88 1.18* 1.27* 0.96  CALCIUM 7.9* 7.9* 7.7* 7.7*  MG  --  1.4* 2.3 2.6*  PHOS  --  4.2 5.9* 3.2   GFR: Estimated Creatinine Clearance: 58.4 mL/min (by C-G formula based on SCr of 0.96 mg/dL). Recent Labs  Lab 10/23/21 0451 10/23/21 1939 10/24/21 0017 10/24/21 0209 10/24/21 0428 10/24/21 0828 10/24/21 1106 10/24/21 1320 10/25/21 0326  PROCALCITON  --   --   --  0.63  --   --   --   --  0.52  WBC 8.4 15.7*  --  12.1*  --   --   --   --  12.7*  LATICACIDVEN  --  3.9*   < >  --  2.5* 2.7* 3.3* 3.3*  --    < > = values in this interval not displayed.    Liver Function Tests: Recent Labs  Lab 10/23/21 1939  ALBUMIN 2.5*   No results for input(s): "LIPASE", "AMYLASE" in the last 168 hours. No results for input(s):  "AMMONIA" in the last 168 hours.  ABG    Component Value Date/Time   PHART 7.37 10/23/2021 0944   PCO2ART 66 (HH) 10/23/2021 0944   PO2ART 60 (L) 10/23/2021 0944   HCO3 38.2 (H) 10/23/2021 0944   O2SAT 90.5 10/23/2021 0944     Coagulation Profile: No results for input(s): "INR", "PROTIME" in the last 168 hours.  Cardiac Enzymes: No results for input(s): "CKTOTAL", "CKMB", "CKMBINDEX", "TROPONINI" in the last 168 hours.  HbA1C: Hgb A1c MFr Bld  Date/Time  Value Ref Range Status  10/23/2021 04:51 AM 7.0 (H) 4.8 - 5.6 % Final    Comment:    (NOTE) Pre diabetes:          5.7%-6.4%  Diabetes:              >6.4%  Glycemic control for   <7.0% adults with diabetes   06/21/2020 04:08 AM 7.5 (H) 4.8 - 5.6 % Final    Comment:    (NOTE) Pre diabetes:          5.7%-6.4%  Diabetes:              >6.4%  Glycemic control for   <7.0% adults with diabetes     CBG: Recent Labs  Lab 10/24/21 0725 10/24/21 1127 10/24/21 1543 10/24/21 2129 10/25/21 0722  GLUCAP 101* 165* 245* 146* 86    Review of Systems:   Positives in BOLD: Gen: fever, chills, weight change, fatigue, night sweats, +pain right hip at surgical site HEENT: Denies blurred vision, double vision, hearing loss, tinnitus, sinus congestion, rhinorrhea, sore throat, neck stiffness, dysphagia PULM: Denies shortness of breath, cough, sputum production, hemoptysis, wheezing CV: Denies chest pain, edema, orthopnea, paroxysmal nocturnal dyspnea, palpitations GI: Denies abdominal pain, nausea, vomiting, diarrhea, hematochezia, melena, constipation, change in bowel habits GU: Denies dysuria, hematuria, polyuria, oliguria, urethral discharge Endocrine: Denies hot or cold intolerance, polyuria, polyphagia or appetite change Derm: Denies rash, dry skin, scaling or peeling skin change Heme: Denies easy bruising, bleeding, bleeding gums Neuro: Denies headache, numbness, weakness, slurred speech, loss of memory or consciousness,  izziness  Past Medical History:  She,  has a past medical history of AAA (abdominal aortic aneurysm) (HCC), Anginal pain ([place]), Anxiety, Arthritis, Automatic implantable cardioverter-defibrillator in situ, Biventricular ICD  [employer], COPD (chronic obstructive pulmonary disease) ([place]), Deafness, Dementia ([place]), Depression, Dizziness, Dysrhythmia, Fracture, GERD (gastroesophageal reflux disease), HFrEF (heart failure with reduced ejection fraction) ([place]), Hypertension, Hypothyroidism, Non-ischemic cardiomyopathy ([place]), Oxygen dependent, Palpitations, Shortness of breath dyspnea, Sleep apnea, and Wheezing.   Surgical History:   Past Surgical History:  Procedure Laterality Date   ABDOMINAL HYSTERECTOMY     BIV ICD GENERATOR CHANGEOUT N/A 09/17/2016   Procedure: BiV ICD Generator Changeout;  Surgeon: Deboraha Sprang, MD;  Location: [facility];  Service: Cardiovascular;  Laterality: N/A;   BLADDER SURGERY     CARDIAC CATHETERIZATION     CARDIAC DEFIBRILLATOR PLACEMENT  4 yrs ago   pacemaker   CATARACT EXTRACTION W/PHACO Left 09/01/2014   Procedure: CATARACT EXTRACTION PHACO AND INTRAOCULAR LENS PLACEMENT ([place]);  Surgeon: Lyla Glassing, MD;  Location: [facility];  Service: Ophthalmology;  Laterality: Left;  [place]: 1:12.1    CATARACT EXTRACTION W/PHACO Right 09/29/2014   Procedure: CATARACT EXTRACTION PHACO AND INTRAOCULAR LENS PLACEMENT (I[facility]);  Surgeon: Lyla Glassing, MD;  Location: [facility];  Service: Ophthalmology;  Laterality: Right;  [place]: 00:52.7    CHOLECYSTECTOMY     COLON SURGERY     COLONOSCOPY     COLONOSCOPY N/A 08/31/2012   Procedure: COLONOSCOPY;  Surgeon: Inda Castle, MD;  Location: [facility];  Service: Endoscopy;  Laterality: N/A;   ESOPHAGOGASTRODUODENOSCOPY N/A 07/06/2012   Procedure: ESOPHAGOGASTRODUODENOSCOPY (EGD);  Surgeon: Lafayette Dragon, MD;  Location: [facility];  Service: Endoscopy;  Laterality: N/A;   INTRAMEDULLARY (IM) NAIL INTERTROCHANTERIC Right 10/23/2021    Procedure: INTRAMEDULLARY (IM) NAIL INTERTROCHANTERIC;  Surgeon: Corky Mull, MD;  Location: [facility];  Service: Orthopedics;  Laterality: Right;   KNEE SURGERY Right  MIDDLE EAR SURGERY     SAVORY DILATION N/A 07/06/2012   Procedure: SAVORY DILATION;  Surgeon: Lafayette Dragon, MD;  Location: [facility];  Service: Endoscopy;  Laterality: N/A;   TEE WITHOUT CARDIOVERSION N/A 06/23/2020   Procedure: TRANSESOPHAGEAL ECHOCARDIOGRAM (TEE);  Surgeon: Minna Merritts, MD;  Location: [facility];  Service: Cardiovascular;  Laterality: N/A;   WRIST SURGERY Right      Social History:   reports that she quit smoking about 19 years ago. Her smoking use included cigarettes. She has a 60.00 pack-year smoking history. She has never used smokeless tobacco. She reports that she does not drink alcohol and does not use drugs.   Family History:  Her family history includes Breast cancer in her mother; Colon cancer (age of onset: 38) in her brother; Heart attack in her sister and sister; Heart disease in her father; Hypertension in her brother and mother; Stroke in her mother.   Allergies Allergies  Allergen Reactions   Codeine Palpitations   Contrast Media [Iodinated Contrast Media] Rash     Home Medications  Prior to Admission medications   Medication Sig Start Date End Date Taking? Authorizing Provider  allopurinol (ZYLOPRIM) 100 MG tablet Take 300 mg by mouth daily.   Yes [provider]  atorvastatin (LIPITOR) 80 MG tablet Take 1 tablet (80 mg total) by mouth daily. 07/12/21 07/07/22 Yes Wellington Hampshire, MD  clopidogrel (PLAVIX) 75 MG tablet Take 1 tablet (75 mg total) by mouth daily. 07/12/21  Yes Wellington Hampshire, MD  digoxin (LANOXIN) 0.125 MG tablet TAKE 1 TABLET BY MOUTH ON MONDAY, [facility] AND FRIDAY 10/10/21  Yes Wellington Hampshire, MD  donepezil (ARICEPT) 10 MG tablet Take 10 mg by mouth at bedtime.   Yes [provider]  furosemide (LASIX) 20 MG tablet Take 1 tablet (20 mg  total) by mouth every other day. May take an additional tablet AS NEEDED for weight gain of 3 lbs overnight or 5 lbs in one week. Do not exceed more than 3 additional doses. - Oral 10/10/21  Yes Wellington Hampshire, MD  gabapentin (NEURONTIN) 300 MG capsule Take 300 mg by mouth 2 (two) times daily as needed (RLS symptoms/ pain).    Yes [provider]  hydrOXYzine (VISTARIL) 25 MG capsule Take 25 mg by mouth at bedtime as needed for anxiety.   Yes [provider]  levothyroxine (SYNTHROID) 112 MCG tablet Take 112 mcg by mouth daily before breakfast.   Yes [provider]  losartan (COZAAR) 25 MG tablet Take 0.5 tablets (12.5 mg total) by mouth daily. 07/12/21  Yes Wellington Hampshire, MD  memantine (NAMENDA) 10 MG tablet Take 10 mg by mouth in the morning and at bedtime. 07/10/21  Yes [provider]  metFORMIN (GLUCOPHAGE-XR) 500 MG 24 hr tablet Take 500 mg by mouth 2 (two) times daily with a meal.    Yes [provider]  metoprolol succinate (TOPROL-XL) 25 MG 24 hr tablet Take 25 mg by mouth daily.   Yes [provider]  omeprazole (PRILOSEC) 20 MG capsule Take 1 capsule (20 mg total) by mouth every morning. 04/12/18  Yes Gladstone Lighter, MD  potassium chloride SA (KLOR-CON M) 20 MEQ tablet Take 1 tablet (20 mEq total) by mouth daily. 07/12/21  Yes Wellington Hampshire, MD  venlafaxine XR (EFFEXOR-XR) 75 MG 24 hr capsule Take 75 mg by mouth 2 (two) times daily. 10/21/21  Yes [provider]  vitamin B-12 (CYANOCOBALAMIN) 1000 MCG  tablet Take 1,000 mcg by mouth 2 (two) times daily.    Yes [provider]  albuterol (ACCUNEB) 0.63 MG/3ML nebulizer solution Take 1 ampule by nebulization every 6 (six) hours as needed for wheezing.    [provider]  albuterol (PROVENTIL HFA;VENTOLIN HFA) 108 (90 Base) MCG/ACT inhaler Inhale 2 puffs into the lungs every 6 (six) hours as needed. 11/02/16   Orbie Pyo, MD  budesonide-formoterol  (SYMBICORT) 160-4.5 MCG/ACT inhaler Inhale 2 puffs into the lungs 2 (two) times daily.    [provider]  Menthol, Topical Analgesic, (BENGAY EX) Apply 1 application topically daily as needed (back pain).    [provider]  mirtazapine (REMERON) 7.5 MG tablet Take 7.5 mg by mouth 2 (two) times daily. 10/10/21   [provider]  Sennosides (EX-LAX PO) Take 2 tablets by mouth at bedtime as needed (constipation).    [provider]  tiotropium (SPIRIVA) 18 MCG inhalation capsule Place 1 capsule (18 mcg total) into inhaler and inhale daily as needed (shortness of breath). 04/12/18   Gladstone Lighter, MD     Critical care time: 80 minutes    Donell Beers, [place] Pager [phone] (please enter 7 digits) PCCM Consult Pager [phone] (please enter 7 digits)

## 2021-10-25 NOTE — Progress Notes (Signed)
PROGRESS NOTE    Stacy Grimes  PXT:[id number] DOB: October 14, 1950 DOA: 10/23/2021 PCP: Cyndi Bender, PA-C    Brief Narrative:  71 y.o. female with medical history significant of nonischemic cardiomyopathy with our UF 20-25%, status post AICD, COPD with chronic hypoxic respite failure on 3-4 L continuously at home, CVA, HTN, AAA, chronic normocytic anemia, presented with mechanical fall and right hip pain.   Appears that patient has a chronic right hip/knee pain, and occasionally she has had episodes of " right leg give up" with near fall and falls.  She had at least 2-3 episode before today.  This morning, patient went to the bathroom, while trying to stand up from toilet bowl, she again felt right leg weakness and fell on the right side and hit her hip.  She denies any prodrome of lightheadedness chest pain dizziness shortness of breath.   She has a chronic O2 dependent on 3 to 4 L, but over the weekend, patient developed a new nonproductive cough and wheezing, family has turned oxygen to 5 L yesterday.  Denies any chest pain no fever chills.  CT pelvis showed right femoral comminuted fracture with intertrochanteric and cervical components.  Taken operating room with orthopedics on 9/19.  Tolerated procedure well however postoperatively became hypotensive requiring initiation of vasopressors.  Was transferred to stepdown unit with PCCM consultation.  Following morning maps are improving.  PCCM remains engaged.  Patient switched to Levophed from Neo-Synephrine.  Remains on 5 L.  9/21: Discussed with PCCM.  Vasopressors weaned off.  PCCM signing off.  Blood pressure stabilized.  Assessment & Plan:   Principal Problem:   Hip fracture ([place]) Active Problems:   Hip fracture, unspecified laterality, closed, initial encounter ([place])   CHF (congestive heart failure) (H[facility])   Shock circulatory ([facility])  Postoperative hypotension/shock, resolved Unclear etiology.  Suspect related to hypovolemia due  to intraoperative blood loss.  Cardiogenic shock versus iatrogenic related to pain medication and anesthesia remains on differential Wean off vasopressors 9/21 Plan: Transfer to progressive unit Discontinue intravenous fluids Cautiously restart beta-blockers Wean midodrine  Acute COPD exacerbation Acute on chronic hypoxic respiratory failure -Active wheezing cough, increasing oxygen demand -No clearly infiltrates or consolidation on chest x-ray afebrile, no leukocytosis Respiratory status improved Plan: DC IV steroids Start p.o. prednisone 40 milligrams daily x5 days Bronchodilators Wean oxygen as tolerated   Chronic HFrEF -Euvolemic -Lasix on hold in setting of shock/hypotension -No IVF -If BP tolerates we will restart home Lasix 9/22   Right proximal femoral comminuted fracture -Tolerated procedure well  -Postop hypotension noted -Orthopedic follow-up for wound check   PAF -On sinus rhythm, continue digoxin Monday Wednesday Friday -Beta-blocker resumed   IIDM -Site scale for now   Diabetic neuropathy -Continue gabapentin   Hx of CVA -Continue aspirin, restarted Plavix 9/21   Hypothyroidism, early dementia -Stable  Acute kidney injury Suspect secondary to ATN versus prerenal azotemia IVF discontinued Daily labs Avoid nonessential nephrotoxins        DVT prophylaxis: SQ Lovenox Code Status: Full Family Communication: Sela Hilding [phone] on 9/20 Disposition Plan: Status is: Inpatient Remains inpatient appropriate because: Hip fracture postoperative hypotension   Level of care: Progressive  Consultants:  PCCM Orthopedics  Procedures:  Hip ORIF  Antimicrobials: None   Subjective: Seen and examined.  A little more confused this morning.  Frequent reorientation necessary.  Endorses pain and numbness in right leg.  Objective: Vitals:   10/25/21 0800 10/25/21 1000 10/25/21 1100 10/25/21 1200  BP: Marland Kitchen)  127/48 (!) 114/55 116/64  130/60  Pulse: 100 (!) 125 (!) 115 (!) 109  Resp: 18 16 16 19   Temp:      TempSrc:      SpO2: 100% 95% 93% 96%  Weight:      Height:        Intake/Output Summary (Last 24 hours) at 10/25/2021 1334 Last data filed at 10/25/2021 0842 Gross per 24 hour  Intake 1389.74 ml  Output 1500 ml  Net -110.26 ml   Filed Weights   10/23/21 1002 10/23/21 1855 10/25/21 0259  Weight: 95.3 kg 95.3 kg 95.8 kg    Examination:  General exam: No acute distress Respiratory system: Clear to auscultation. Respiratory effort normal. Cardiovascular system: S1-S2, distant heart sounds, no murmurs, no pedal edema Gastrointestinal system: Soft, NT/ND, normal bowel sounds Central nervous system: Lethargic, alert and oriented. No focal neurological deficits. Extremities: Right hip surgical site, dressing CDI Skin: No rashes, lesions or ulcers Psychiatry: Judgement and insight appear normal. Mood & affect confused.     Data Reviewed: I have personally reviewed following labs and imaging studies  CBC: Recent Labs  Lab 10/23/21 0451 10/23/21 1939 10/24/21 0209 10/25/21 0326  WBC 8.4 15.7* 12.1* 12.7*  NEUTROABS  --   --   --  10.7*  HGB 9.1* 7.3* 9.5* 7.9*  HCT 32.0* 26.1* 32.7* 26.7*  MCV 79.2* 80.6 83.4 83.7  PLT 287 279 247 509   Basic Metabolic Panel: Recent Labs  Lab 10/23/21 0451 10/23/21 1939 10/24/21 0209 10/25/21 0326  NA 145 143 146* 142  K 4.3 3.6 4.1 4.0  CL 103 100 105 104  CO2 34* 31 33* 30  GLUCOSE 119* 138* 119* 117*  BUN 13 15 18 19   CREATININE 0.88 1.18* 1.27* 0.96  CALCIUM 7.9* 7.9* 7.7* 7.7*  MG  --  1.4* 2.3 2.6*  PHOS  --  4.2 5.9* 3.2   GFR: Estimated Creatinine Clearance: 58.4 mL/min (by C-G formula based on SCr of 0.96 mg/dL). Liver Function Tests: Recent Labs  Lab 10/23/21 1939  ALBUMIN 2.5*   No results for input(s): "LIPASE", "AMYLASE" in the last 168 hours. No results for input(s): "AMMONIA" in the last 168 hours. Coagulation Profile: No results  for input(s): "INR", "PROTIME" in the last 168 hours. Cardiac Enzymes: No results for input(s): "CKTOTAL", "CKMB", "CKMBINDEX", "TROPONINI" in the last 168 hours. BNP (last 3 results) No results for input(s): "PROBNP" in the last 8760 hours. HbA1C: Recent Labs    10/23/21 0451  HGBA1C 7.0*   CBG: Recent Labs  Lab 10/24/21 1127 10/24/21 1543 10/24/21 2129 10/25/21 0722 10/25/21 1111  GLUCAP 165* 245* 146* 86 204*   Lipid Profile: No results for input(s): "CHOL", "HDL", "LDLCALC", "TRIG", "CHOLHDL", "LDLDIRECT" in the last 72 hours. Thyroid Function Tests: No results for input(s): "TSH", "T4TOTAL", "FREET4", "T3FREE", "THYROIDAB" in the last 72 hours. Anemia Panel: Recent Labs    10/23/21 0451  FERRITIN 33  TIBC 356  IRON 98  RETICCTPCT 0.8   Sepsis Labs: Recent Labs  Lab 10/24/21 0209 10/24/21 0428 10/24/21 0828 10/24/21 1106 10/24/21 1320 10/25/21 0326  PROCALCITON 0.63  --   --   --   --  0.52  LATICACIDVEN  --  2.5* 2.7* 3.3* 3.3*  --     Recent Results (from the past 240 hour(s))  MRSA Next Gen by PCR, Nasal     Status: None   Collection Time: 10/23/21  7:01 PM   Specimen: Nasal Mucosa; Nasal Swab  Result Value Ref Range Status   MRSA by PCR Next Gen NOT DETECTED NOT DETECTED Final    Comment: (NOTE) The GeneXpert MRSA Assay (FDA approved for NASAL specimens only), is one component of a comprehensive MRSA colonization surveillance program. It is not intended to diagnose MRSA infection nor to guide or monitor treatment for MRSA infections. Test performance is not FDA approved in patients less than 28 years old. Performed at [facility], [address] [place],  [address]          Radiology Studies: ECHOCARDIOGRAM COMPLETE  Result Date: 10/24/2021    ECHOCARDIOGRAM REPORT   Patient Name:   ISIDRA MINGS [facility] Date of Exam: 10/23/2021 Medical Rec #:  [id number]        Height:       63.0 in Accession #:    [id number]       Weight:        210.1 lb Date of Birth:  Aug 10, 1950         BSA:          1.975 m Patient Age:    69 years         BP:           100/60 mmHg Patient Gender: F                HR:           95 bpm. Exam Location:  [facility] Procedure: 2D Echo, Cardiac Doppler, Color Doppler and Intracardiac            Opacification Agent Indications:     K87.68 Acute Systolic CHF  History:         Patient has prior history of Echocardiogram examinations, most                  recent 06/23/2020. CHF, COPD, Signs/Symptoms:Shortness of                  Breath; Risk Factors:Hypertension and Sleep Apnea. AAA.                  Nonishemic cardiomyopathy. Palpitations. Hypothyroidism.  Sonographer:     Cresenciano Lick RDCS Referring Phys:  [id number] Bradly Bienenstock Diagnosing Phys: Nelva Bush MD IMPRESSIONS  1. Left ventricular ejection fraction, by estimation, is 40 to 45%. The left ventricle has mildly decreased function. The left ventricle demonstrates global hypokinesis. There is mild left ventricular hypertrophy. Indeterminate diastolic filling due to E-A fusion.  2. Right ventricular systolic function is mildly reduced. The right ventricular size is moderately enlarged. There is severely elevated pulmonary artery systolic pressure.  3. The mitral valve is normal in structure. Trivial mitral valve regurgitation.  4. Tricuspid valve regurgitation is mild to moderate.  5. The aortic valve is tricuspid. Aortic valve regurgitation is not visualized. No aortic stenosis is present.  6. The inferior vena cava is normal in size with <50% respiratory variability, suggesting right atrial pressure of 8 mmHg. Comparison(s): A prior study was performed on 06/22/2020. LVEF is significantly higher. Right ventricle appears dilated and hypokinetic on today's study. FINDINGS  Left Ventricle: Left ventricular ejection fraction, by estimation, is 40 to 45%. The left ventricle has mildly decreased function. The left ventricle demonstrates global hypokinesis. Definity  contrast agent was given IV to delineate the left ventricular  endocardial borders. The left ventricular internal cavity size was normal in size. There is mild left ventricular hypertrophy. Indeterminate diastolic filling due to E-A fusion. Right Ventricle:  The right ventricular size is moderately enlarged. No increase in right ventricular wall thickness. Right ventricular systolic function is mildly reduced. There is severely elevated pulmonary artery systolic pressure. The tricuspid regurgitant velocity is 4.07 m/s, and with an assumed right atrial pressure of 8 mmHg, the estimated right ventricular systolic pressure is 10.2 mmHg. Left Atrium: Left atrial size was normal in size. Right Atrium: Right atrial size was normal in size. Pericardium: There is no evidence of pericardial effusion. Mitral Valve: The mitral valve is normal in structure. Trivial mitral valve regurgitation. Tricuspid Valve: The tricuspid valve is normal in structure. Tricuspid valve regurgitation is mild to moderate. Aortic Valve: The aortic valve is tricuspid. Aortic valve regurgitation is not visualized. No aortic stenosis is present. Pulmonic Valve: The pulmonic valve was not well visualized. Pulmonic valve regurgitation is not visualized. No evidence of pulmonic stenosis. Aorta: The aortic root is normal in size and structure. Pulmonary Artery: The pulmonary artery is of normal size. Venous: The inferior vena cava is normal in size with less than 50% respiratory variability, suggesting right atrial pressure of 8 mmHg. IAS/Shunts: The interatrial septum was not well visualized. Additional Comments: A device lead is visualized in the right atrium and right ventricle.  LEFT VENTRICLE PLAX 2D LVIDd:         4.70 cm   Diastology LVIDs:         3.70 cm   LV e' medial:    7.24 cm/s LV PW:         1.21 cm   LV E/e' medial:  16.3 LV IVS:        0.97 cm   LV e' lateral:   5.44 cm/s LVOT diam:     1.70 cm   LV E/e' lateral: 21.7 LV SV:         35 LV  SV Index:   18 LVOT Area:     2.27 cm  RIGHT VENTRICLE             IVC RV Basal diam:  4.80 cm     IVC diam: 1.90 cm RV S prime:     12.60 cm/s TAPSE (M-mode): 1.9 cm LEFT ATRIUM             Index        RIGHT ATRIUM           Index LA diam:        4.40 cm 2.23 cm/m   RA Area:     16.20 cm LA Vol (A2C):   37.6 ml 19.04 ml/m  RA Volume:   50.90 ml  25.78 ml/m LA Vol (A4C):   31.6 ml 16.00 ml/m LA Biplane Vol: 35.3 ml 17.88 ml/m  AORTIC VALVE LVOT Vmax:   111.67 cm/s LVOT Vmean:  66.600 cm/s LVOT VTI:    0.154 m  AORTA Ao Root diam: 3.00 cm MV E velocity: 118.00 cm/s  TRICUSPID VALVE MV A velocity: 132.00 cm/s  TR Peak grad:   66.3 mmHg MV E/A ratio:  0.89         TR Vmax:        407.00 cm/s                              SHUNTS                             Systemic VTI:  0.15 m                             Systemic Diam: 1.70 cm Nelva Bush MD Electronically signed by Nelva Bush MD Signature Date/Time: 10/24/2021/7:18:44 AM    Final    DG Chest Port 1 View  Result Date: 10/24/2021 CLINICAL DATA:  Acute on chronic respiratory failure EXAM: PORTABLE CHEST 1 VIEW COMPARISON:  10/23/2021 FINDINGS: Cardiac shadow is stable. Defibrillator is again noted and stable. Aortic calcifications are seen. The lungs are well aerated bilaterally. No focal infiltrate or effusion is seen. No bony abnormality is noted. IMPRESSION: No acute abnormality noted. Electronically Signed   By: Inez Catalina M.D.   On: 10/24/2021 04:15   DG Chest 1 View  Result Date: 10/23/2021 CLINICAL DATA:  Fluid overload.  History of CHF. EXAM: CHEST  1 VIEW COMPARISON:  Radiograph earlier today. FINDINGS: Left-sided pacemaker in place. Similar cardiomegaly. Aortic atherosclerosis. Increasing interstitial opacities typical of pulmonary edema. There is hazy opacity throughout the left hemithorax which may represent layering effusion. No confluent consolidation. No pneumothorax. IMPRESSION: 1. Increasing interstitial opacities typical of  pulmonary edema. 2. Hazy opacity throughout the left hemithorax which may represent layering effusion. 3. Cardiomegaly. Electronically Signed   By: Keith Rake M.D.   On: 10/23/2021 17:58   DG HIP UNILAT WITH PELVIS 1V RIGHT  Result Date: 10/23/2021 CLINICAL DATA:  Fracture right femur EXAM: DG HIP (WITH OR WITHOUT PELVIS) 1V RIGHT COMPARISON:  Study done earlier today FINDINGS: Fluoroscopic images show reduction and internal fixation of comminuted intertrochanteric fracture of neck of right femur with intramedullary rod. Fluoroscopy time 1 minutes and 45 seconds. Radiation dose 23.21 mGy. IMPRESSION: Fluoroscopic assistance was provided for reduction and internal fixation of intertrochanteric fracture of proximal right femur. Electronically Signed   By: Elmer Picker M.D.   On: 10/23/2021 15:15   DG C-Arm 1-60 Min-No Report  Result Date: 10/23/2021 Fluoroscopy was utilized by the requesting physician.  No radiographic interpretation.        Scheduled Meds:  allopurinol  100 mg Oral BID   atorvastatin  80 mg Oral QPM   Chlorhexidine Gluconate Cloth  6 each Topical Q0600   clopidogrel  75 mg Oral Daily   digoxin  0.125 mg Oral Once per day on Mon Wed Fri   docusate sodium  100 mg Oral BID   donepezil  10 mg Oral QHS   enoxaparin (LOVENOX) injection  0.5 mg/kg Subcutaneous Q24H   feeding supplement  237 mL Oral BID BM   gabapentin  300 mg Oral BID   guaiFENesin  1,200 mg Oral BID   insulin aspart  0-15 Units Subcutaneous TID WC   levothyroxine  100 mcg Oral Q0600   memantine  10 mg Oral BID   metoprolol tartrate  12.5 mg Oral BID   midodrine  5 mg Oral TID WC   mirtazapine  7.5 mg Oral BID   mometasone-formoterol  2 puff Inhalation BID   multivitamin with minerals  1 tablet Oral Daily   pantoprazole  40 mg Oral Daily   predniSONE  40 mg Oral Q breakfast   tiotropium  18 mcg Inhalation Daily   venlafaxine XR  75 mg Oral BID   Continuous Infusions:  sodium chloride  Stopped (10/23/21 1958)   sodium chloride Stopped (10/25/21 0830)     LOS: 2 days     Sidney Ace, MD [facility]  If 7PM-7AM, please contact night-coverage  10/25/2021, 1:34 PM

## 2021-10-25 NOTE — Progress Notes (Signed)
Occupational Therapy Treatment Patient Details Name: Stacy Grimes Grimes MRN: [id number] DOB: [birth year] Today's Date: 10/25/2021   History of present illness Pt is a 71 yo female s/p hip IM nailing due to a fall. Transferred to ICU for hypotension. PMH of HTN, CHF, COPD, cardiac defibrillator, DM, AAA, dementia, spinal fx, O2 use, CVA, L ear deafness.   OT comments  Stacy Grimes Grimes was seen for OT/PT co-treatment on this date. Upon arrival to room pt reclined in bed, family at bedside and encouraging pt to participate. Pt requires MAX A don B socks bed level and exit bed - HR increased to 140s in sitting, resolved to 110s with time and reassurance. PT in to assist with standing. MAX A x2 + RW for 2 standing trials at EOB, poor standing tolerance and HR 150s, resolved with seated rest breaks however pt states pain limiting progression. Pt making progress toward goals, will continue to follow POC. Discharge recommendation remains appropriate.     Recommendations for follow up therapy are one component of a multi-disciplinary discharge planning process, led by the attending physician.  Recommendations may be updated based on patient status, additional functional criteria and insurance authorization.    Follow Up Recommendations  Skilled nursing-short term rehab (<3 hours/day)    Assistance Recommended at Discharge Frequent or constant Supervision/Assistance  Patient can return home with the following  A lot of help with walking and/or transfers;A lot of help with bathing/dressing/bathroom   Equipment Recommendations  Hospital bed    Recommendations for Other Services      Precautions / Restrictions Precautions Precautions: Fall Restrictions Weight Bearing Restrictions: Yes RLE Weight Bearing: Weight bearing as tolerated       Mobility Bed Mobility Overal bed mobility: Needs Assistance Bed Mobility: Supine to Sit, Sit to Supine     Supine to sit: Max assist, HOB elevated Sit to supine:  Max assist, +2 for physical assistance        Transfers Overall transfer level: Needs assistance Equipment used: Rolling walker (2 wheels) Transfers: Sit to/from Stand Sit to Stand: Max assist, +2 safety/equipment                 Balance Overall balance assessment: Needs assistance Sitting-balance support: Feet supported, Bilateral upper extremity supported Sitting balance-Leahy Scale: Fair     Standing balance support: Bilateral upper extremity supported, Reliant on assistive device for balance Standing balance-Leahy Scale: Poor                             ADL either performed or assessed with clinical judgement   ADL Overall ADL's : Needs assistance/impaired                                       General ADL Comments: MAX A don B socks bed level. MAX A x2 + RW simulated BSC t/f.      Cognition Arousal/Alertness: Awake/alert Behavior During Therapy: WFL for tasks assessed/performed Overall Cognitive Status: History of cognitive impairments - at baseline                                           Pertinent Vitals/ Pain       Pain Assessment Pain Assessment: Faces Faces Pain Scale: Hurts little more  Pain Location: RLE with movement Pain Descriptors / Indicators: Aching, Guarding, Grimacing, Moaning Pain Intervention(s): Limited activity within patient's tolerance, Repositioned   Frequency  Min 2X/week        Progress Toward Goals  OT Goals(current goals can now be found in the care plan section)  Progress towards OT goals: Progressing toward goals  Acute Rehab OT Goals Patient Stated Goal: to go home OT Goal Formulation: With patient/family Time For Goal Achievement: 11/07/21 Potential to Achieve Goals: Good ADL Goals Pt Will Perform Grooming: with modified independence;sitting Pt Will Perform Lower Body Dressing: with min assist;with caregiver independent in assisting;sitting/lateral leans Pt Will  Transfer to Toilet: with mod assist;stand pivot transfer;bedside commode  Plan Discharge plan remains appropriate;Frequency remains appropriate    Co-evaluation    PT/OT/SLP Co-Evaluation/Treatment: Yes Reason for Co-Treatment: For patient/therapist safety;To address functional/ADL transfers PT goals addressed during session: Mobility/safety with mobility OT goals addressed during session: ADL's and self-care      AM-PAC OT "6 Clicks" Daily Activity     Outcome Measure   Help from another person eating meals?: None Help from another person taking care of personal grooming?: A Little Help from another person toileting, which includes using toliet, bedpan, or urinal?: A Lot Help from another person bathing (including washing, rinsing, drying)?: A Lot Help from another person to put on and taking off regular upper body clothing?: A Lot Help from another person to put on and taking off regular lower body clothing?: A Lot 6 Click Score: 15    End of Session Equipment Utilized During Treatment: Gait belt;Rolling walker (2 wheels)  OT Visit Diagnosis: Repeated falls (R29.6);Muscle weakness (generalized) (M62.81)   Activity Tolerance Patient tolerated treatment well   Patient Left in bed;with call bell/phone within reach;with bed alarm set;with family/visitor present   Nurse Communication Other (comment) (HR max 150s)        Time: 4401-0272 OT Time Calculation (min): 26 min  Charges: OT General Charges $OT Visit: 1 Visit OT Treatments $Self Care/Home Management : 8-22 mins  Dessie Coma, M.S. OTR/L  10/25/21, 12:03 PM  ascom [phone]

## 2021-10-25 NOTE — Progress Notes (Signed)
Subjective: 2 Days Post-Op Procedure(s) (LRB): INTRAMEDULLARY (IM) NAIL INTERTROCHANTERIC (Right) Patient reports pain as mild in the right  Patient is currently admitted to the ICU for continued monitoring of hypotension. Patient is on chronic O2, requiring 3-4L at baseline. PT and care management to assist with discharge planning following surgery.  Patient will likely need SNF upon discharge. Negative for chest pain but does have chronic SOB. Fever: no Gastrointestinal:Negative for nausea and vomiting  Objective: Vital signs in last 24 hours: Temp:  [98.2 F (36.8 C)] 98.2 F (36.8 C) (09/21 0000) Pulse Rate:  [42-125] 104 (09/21 1300) Resp:  [12-24] 13 (09/21 1300) BP: (112-155)/(42-121) 125/58 (09/21 1300) SpO2:  [91 %-100 %] 98 % (09/21 1300) Weight:  [95.8 kg] 95.8 kg (09/21 0259)  Intake/Output from previous day:  Intake/Output Summary (Last 24 hours) at 10/25/2021 1339 Last data filed at 10/25/2021 1300 Gross per 24 hour  Intake 1389.74 ml  Output 1700 ml  Net -310.26 ml    Intake/Output this shift: Total I/O In: 50.3 [I.V.:50.3] Out: 500 [Urine:500]  Labs: Recent Labs    10/23/21 0451 10/23/21 1939 10/24/21 0209 10/25/21 0326  HGB 9.1* 7.3* 9.5* 7.9*   Recent Labs    10/24/21 0209 10/25/21 0326  WBC 12.1* 12.7*  RBC 3.92 3.19*  HCT 32.7* 26.7*  PLT 247 190   Recent Labs    10/24/21 0209 10/25/21 0326  NA 146* 142  K 4.1 4.0  CL 105 104  CO2 33* 30  BUN 18 19  CREATININE 1.27* 0.96  GLUCOSE 119* 117*  CALCIUM 7.7* 7.7*   No results for input(s): "LABPT", "INR" in the last 72 hours.   EXAM General - Patient is Alert, Appropriate, and Oriented Extremity - ABD soft Sensation intact distally Dorsiflexion/Plantar flexion intact Incision: dressing C/D/I No cellulitis present Compartment soft Dressing/Incision - honeycomb dressings intact without drainage to the right leg this morning. Motor Function - intact, moving foot and toes well  on exam.  Abdomen soft with intact bowel sounds.  Past Medical History:  Diagnosis Date   AAA (abdominal aortic aneurysm) (HCC)    Anginal pain ([place])    Anxiety    Arthritis    Automatic implantable cardioverter-defibrillator in situ    Biventricular ICD  [employer]    COPD (chronic obstructive pulmonary disease) (HCC)    Deafness    left ear   Dementia (HCC)    Depression    Dizziness    Dysrhythmia    Fracture    history of spinal fracture   GERD (gastroesophageal reflux disease)    HFrEF (heart failure with reduced ejection fraction) ([place])    a. 2010 Cath: nonobs dzs, EF 15-20%; b. 09/2017 Echo: EF suboptimal. EF low nl; c. 04/2018 Echo: EF 40-45%.   Hypertension    Hypothyroidism    Non-ischemic cardiomyopathy ([place])    a. 2010 Cath: nonobs dzs, EF 15-20%; b. 2011 s/p SJM CRT-D; c. 09/2016 s/p Gen change; d. 09/2017 Echo: EF suboptimal. EF low nl; e. 04/2018 Echo: EF 40-45%.   Oxygen dependent    Palpitations    Shortness of breath dyspnea    Sleep apnea    Wheezing     Assessment/Plan: 2 Days Post-Op Procedure(s) (LRB): INTRAMEDULLARY (IM) NAIL INTERTROCHANTERIC (Right) Principal Problem:   Hip fracture (HCC) Active Problems:   Hip fracture, unspecified laterality, closed, initial encounter ([place])   CHF (congestive heart failure) (HCC)   Shock circulatory (HCC)  Estimated body mass index is 37.41 kg/m as  calculated from the following:   Height as of this encounter: 5\' 3"  (1.6 m).   Weight as of this encounter: 95.8 kg. Advance diet Up with therapy  Labs and vitals reviewed, Hg 7.9 this morning. Patient currently in the ICU for monitoring of hypotension. Improving. Up with therapy today as tolerated. Will likely need SNF at discharge.  DVT Prophylaxis - Lovenox and TED hose Weight-Bearing as tolerated to right leg  J. Cameron Proud, PA-C [facility] Orthopaedic Surgery 10/25/2021, 1:39 PM

## 2021-10-25 NOTE — Progress Notes (Signed)
Physical Therapy Treatment Patient Details Name: Stacy Grimes MRN: [id number] DOB: [birth year] Today's Date: 10/25/2021   History of Present Illness Pt is a 71 yo female s/p hip IM nailing due to a fall. Transferred to ICU for hypotension. PMH of HTN, CHF, COPD, cardiac defibrillator, DM, AAA, dementia, spinal fx, O2 use, CVA, L ear deafness.    PT Comments    Patient sitting EOB with OT upon PT entrance. Pt more alert, family at bedside. Pt able to perform sit <> Stand twice today with maxAx2 and stabilization of RW, pt still fearful of falling. HR increased to 150s with each standing attempt, RN notified. HR returned to 120s between bouts with extended seated rest. Pt also instructed in PLB due to reported SOB. SpO2 88-95% on 5L throughout. She was able to shuffle her feet to the head of the bed, maxAx2 and RW assistance. Returned to supine maxAx2 with all needs in reach. The patient would benefit from further skilled PT intervention to continue to progress towards goals. Recommendation remains appropriate.       Recommendations for follow up therapy are one component of a multi-disciplinary discharge planning process, led by the attending physician.  Recommendations may be updated based on patient status, additional functional criteria and insurance authorization.  Follow Up Recommendations  Skilled nursing-short term rehab (<3 hours/day) Can patient physically be transported by private vehicle: No   Assistance Recommended at Discharge Frequent or constant Supervision/Assistance  Patient can return home with the following Two people to help with walking and/or transfers;Two people to help with bathing/dressing/bathroom;Help with stairs or ramp for entrance;Assistance with feeding;Assist for transportation;Direct supervision/assist for financial management;Assistance with cooking/housework   Equipment Recommendations  Other (comment) (TBD)    Recommendations for Other Services        Precautions / Restrictions Precautions Precautions: Fall Restrictions Weight Bearing Restrictions: Yes RLE Weight Bearing: Weight bearing as tolerated     Mobility  Bed Mobility Overal bed mobility: Needs Assistance Bed Mobility: Sit to Supine       Sit to supine: Max assist, +2 for physical assistance        Transfers Overall transfer level: Needs assistance Equipment used: Rolling walker (2 wheels) Transfers: Sit to/from Stand Sit to Stand: Max assist, +2 safety/equipment                Ambulation/Gait               General Gait Details: unable to truly ambulate; a few shuffled steps maxAx2 at [employer] Mobility    Modified Rankin (Stroke Patients Only)       Balance Overall balance assessment: Needs assistance Sitting-balance support: Feet supported, Bilateral upper extremity supported Sitting balance-Leahy Scale: Fair     Standing balance support: Bilateral upper extremity supported, Reliant on assistive device for balance Standing balance-Leahy Scale: Poor                              Cognition Arousal/Alertness: Awake/alert Behavior During Therapy: WFL for tasks assessed/performed Overall Cognitive Status: History of cognitive impairments - at baseline                                 General Comments: improved alertness today        Exercises  General Comments        Pertinent Vitals/Pain Pain Assessment Pain Assessment: Faces Faces Pain Scale: Hurts little more Pain Location: RLE with movement Pain Descriptors / Indicators: Aching, Guarding, Grimacing, Moaning Pain Intervention(s): Limited activity within patient's tolerance, Repositioned, Monitored during session    Home Living                          Prior Function            PT Goals (current goals can now be found in the care plan section) Progress towards PT goals: Progressing toward  goals    Frequency    7X/week      PT Plan Current plan remains appropriate    Co-evaluation PT/OT/SLP Co-Evaluation/Treatment: Yes Reason for Co-Treatment: Complexity of the patient's impairments (multi-system involvement);Necessary to address cognition/behavior during functional activity;For patient/therapist safety PT goals addressed during session: Mobility/safety with mobility;Balance;Proper use of DME OT goals addressed during session: Strengthening/ROM      AM-PAC PT "6 Clicks" Mobility   Outcome Measure  Help needed turning from your back to your side while in a flat bed without using bedrails?: A Lot Help needed moving from lying on your back to sitting on the side of a flat bed without using bedrails?: A Lot Help needed moving to and from a bed to a chair (including a wheelchair)?: Total Help needed standing up from a chair using your arms (e.g., wheelchair or bedside chair)?: A Lot Help needed to walk in hospital room?: Total Help needed climbing 3-5 steps with a railing? : Total 6 Click Score: 9    End of Session Equipment Utilized During Treatment: Gait belt Activity Tolerance: Patient limited by fatigue (elevated HR) Patient left: in bed;with call bell/phone within reach;with bed alarm set Nurse Communication: Mobility status PT Visit Diagnosis: Other abnormalities of gait and mobility (R26.89);Difficulty in walking, not elsewhere classified (R26.2);Muscle weakness (generalized) (M62.81);Pain Pain - Right/Left: Right Pain - part of body: Hip     Time: 3612-2449 PT Time Calculation (min) (ACUTE ONLY): 17 min  Charges:  $Therapeutic Activity: 8-22 mins                    Stacy Grimes PT, DPT 11:58 AM,10/25/21

## 2021-10-26 ENCOUNTER — Telehealth: Payer: Self-pay

## 2021-10-26 DIAGNOSIS — S72001D Fracture of unspecified part of neck of right femur, subsequent encounter for closed fracture with routine healing: Secondary | ICD-10-CM | POA: Diagnosis not present

## 2021-10-26 DIAGNOSIS — Z7189 Other specified counseling: Secondary | ICD-10-CM | POA: Diagnosis not present

## 2021-10-26 LAB — GLUCOSE, CAPILLARY
Glucose-Capillary: 91 mg/dL (ref 70–99)
Glucose-Capillary: 139 mg/dL — ABNORMAL HIGH (ref 70–99)
Glucose-Capillary: 198 mg/dL — ABNORMAL HIGH (ref 70–99)
Glucose-Capillary: 133 mg/dL — ABNORMAL HIGH (ref 70–99)

## 2021-10-26 LAB — CBC WITH DIFFERENTIAL/PLATELET
Abs Immature Granulocytes: 0.06 10*3/uL (ref 0.00–0.07)
Basophils Absolute: 0 10*3/uL (ref 0.0–0.1)
Basophils Relative: 0 %
Eosinophils Absolute: 0 10*3/uL (ref 0.0–0.5)
Eosinophils Relative: 0 %
HCT: 27.5 % — ABNORMAL LOW (ref 36.0–46.0)
Hemoglobin: 8 g/dL — ABNORMAL LOW (ref 12.0–15.0)
Immature Granulocytes: 1 %
Lymphocytes Relative: 12 %
Lymphs Abs: 1.5 10*3/uL (ref 0.7–4.0)
MCH: 24.2 pg — ABNORMAL LOW (ref 26.0–34.0)
MCHC: 29.1 g/dL — ABNORMAL LOW (ref 30.0–36.0)
MCV: 83.1 fL (ref 80.0–100.0)
Monocytes Absolute: 0.9 10*3/uL (ref 0.1–1.0)
Monocytes Relative: 7 %
Neutro Abs: 10.2 10*3/uL — ABNORMAL HIGH (ref 1.7–7.7)
Neutrophils Relative %: 80 %
Platelets: 220 10*3/uL (ref 150–400)
RBC: 3.31 MIL/uL — ABNORMAL LOW (ref 3.87–5.11)
RDW: 20.4 % — ABNORMAL HIGH (ref 11.5–15.5)
WBC: 12.6 10*3/uL — ABNORMAL HIGH (ref 4.0–10.5)
nRBC: 0.4 % — ABNORMAL HIGH (ref 0.0–0.2)

## 2021-10-26 LAB — BASIC METABOLIC PANEL
Anion gap: 6 (ref 5–15)
BUN: 19 mg/dL (ref 8–23)
CO2: 32 mmol/L (ref 22–32)
Calcium: 8.3 mg/dL — ABNORMAL LOW (ref 8.9–10.3)
Chloride: 104 mmol/L (ref 98–111)
Creatinine, Ser: 0.87 mg/dL (ref 0.44–1.00)
GFR, Estimated: >60 mL/min (ref >60)
Glucose, Bld: 143 mg/dL — ABNORMAL HIGH (ref 70–99)
Potassium: 4.1 mmol/L (ref 3.5–5.1)
Sodium: 142 mmol/L (ref 135–145)

## 2021-10-26 LAB — PROCALCITONIN: Procalcitonin: 0.7 ng/mL

## 2021-10-26 LAB — MAGNESIUM: Magnesium: 2.5 mg/dL — ABNORMAL HIGH (ref 1.7–2.4)

## 2021-10-26 LAB — PHOSPHORUS: Phosphorus: 2.4 mg/dL — ABNORMAL LOW (ref 2.5–4.6)

## 2021-10-26 MED ORDER — GABAPENTIN 300 MG PO CAPS
300.0000 mg | ORAL_CAPSULE | Freq: Three times a day (TID) | ORAL | Status: DC
  Administered 2021-10-26 – 2021-10-27 (×4): 300 mg via ORAL
  Filled 2021-10-26 (×4): qty 1

## 2021-10-26 MED ORDER — ACETAMINOPHEN 325 MG PO TABS
650.0000 mg | ORAL_TABLET | Freq: Four times a day (QID) | ORAL | Status: DC | PRN
  Administered 2021-10-27: 650 mg via ORAL
  Filled 2021-10-26: qty 2

## 2021-10-26 NOTE — Telephone Encounter (Signed)
Received voice mail message from husband.  Pt is is currently hospitalized at [facility] for fall resulting in hip fracture and surgery.  She will be in the hospital for a few days followed by rehab stay to help with recovery.  He will call back with further updates when patient gets moved to a rehab facility.    He also asked to inform Dr Caryl Comes that patient is currently hospitalized at [facility] in [place] and copy of note sent to Dr Caryl Comes as [name].

## 2021-10-26 NOTE — Progress Notes (Signed)
Subjective: 3 Days Post-Op Procedure(s) (LRB): INTRAMEDULLARY (IM) NAIL INTERTROCHANTERIC (Right) Patient reports pain as mild in the right hip this morning. Patient is currently admitted to the ICU for continued monitoring of hypotension. Patient is on chronic O2, requiring 3-4L at baseline. PT and care management to assist with discharge planning following surgery.  Patient will likely need SNF upon discharge. Negative for chest pain but does have chronic SOB. Fever: no Gastrointestinal:Negative for nausea and vomiting  Objective: Vital signs in last 24 hours: Temp:  [97.8 F (36.6 C)] 97.8 F (36.6 C) (09/21 2200) Pulse Rate:  [74-100] 81 (09/22 1200) Resp:  [10-19] 11 (09/22 1200) BP: (106-138)/(46-98) 111/71 (09/22 1200) SpO2:  [91 %-100 %] 98 % (09/22 1200) Weight:  [91.5 kg] 91.5 kg (09/22 0500)  Intake/Output from previous day:  Intake/Output Summary (Last 24 hours) at 10/26/2021 1323 Last data filed at 10/26/2021 0900 Gross per 24 hour  Intake 120 ml  Output 1250 ml  Net -1130 ml    Intake/Output this shift: Total I/O In: 120 [P.O.:120] Out: -   Labs: Recent Labs    10/23/21 1939 10/24/21 0209 10/25/21 0326 10/26/21 0549  HGB 7.3* 9.5* 7.9* 8.0*   Recent Labs    10/25/21 0326 10/26/21 0549  WBC 12.7* 12.6*  RBC 3.19* 3.31*  HCT 26.7* 27.5*  PLT 190 220   Recent Labs    10/25/21 0326 10/26/21 0549  NA 142 142  K 4.0 4.1  CL 104 104  CO2 30 32  BUN 19 19  CREATININE 0.96 0.87  GLUCOSE 117* 143*  CALCIUM 7.7* 8.3*   No results for input(s): "LABPT", "INR" in the last 72 hours.   EXAM General - Patient is Alert, Appropriate, and Oriented Extremity - ABD soft Sensation intact distally Dorsiflexion/Plantar flexion intact Incision: dressing C/D/I No cellulitis present Compartment soft Dressing/Incision - honeycomb dressings intact without drainage to the right leg this morning. Motor Function - intact, moving foot and toes well on exam.   Abdomen soft with intact bowel sounds.  Past Medical History:  Diagnosis Date   AAA (abdominal aortic aneurysm) (HCC)    Anginal pain ([place])    Anxiety    Arthritis    Automatic implantable cardioverter-defibrillator in situ    Biventricular ICD  [employer]    COPD (chronic obstructive pulmonary disease) (HCC)    Deafness    left ear   Dementia (HCC)    Depression    Dizziness    Dysrhythmia    Fracture    history of spinal fracture   GERD (gastroesophageal reflux disease)    HFrEF (heart failure with reduced ejection fraction) ([place])    a. 2010 Cath: nonobs dzs, EF 15-20%; b. 09/2017 Echo: EF suboptimal. EF low nl; c. 04/2018 Echo: EF 40-45%.   Hypertension    Hypothyroidism    Non-ischemic cardiomyopathy ([place])    a. 2010 Cath: nonobs dzs, EF 15-20%; b. 2011 s/p SJM CRT-D; c. 09/2016 s/p Gen change; d. 09/2017 Echo: EF suboptimal. EF low nl; e. 04/2018 Echo: EF 40-45%.   Oxygen dependent    Palpitations    Shortness of breath dyspnea    Sleep apnea    Wheezing     Assessment/Plan: 3 Days Post-Op Procedure(s) (LRB): INTRAMEDULLARY (IM) NAIL INTERTROCHANTERIC (Right) Principal Problem:   Hip fracture (HCC) Active Problems:   Hip fracture, unspecified laterality, closed, initial encounter (HCC)   CHF (congestive heart failure) (HCC)   Shock circulatory (HCC)  Estimated body mass index is 35.73  kg/m as calculated from the following:   Height as of this encounter: 5\' 3"  (1.6 m).   Weight as of this encounter: 91.5 kg. Advance diet Up with therapy  Labs and vitals reviewed, Hg 8.0 this morning. Patient currently in the ICU for monitoring of hypotension. Improving. Up with therapy today as tolerated. Plan is for SNF at discharge.  DVT Prophylaxis - Lovenox and TED hose Weight-Bearing as tolerated to right leg  J. Cameron Proud, PA-C [facility] Orthopaedic Surgery 10/26/2021, 1:23 PM

## 2021-10-26 NOTE — TOC CM/SW Note (Signed)
Per chart review, patient presents with confusion. CSW attempted call to spouse to discuss SNF rec. Left VM requesting a return call.   Oleh Genin, [place]

## 2021-10-26 NOTE — Consult Note (Signed)
Consultation Note Date: 10/26/2021   Patient Name: Stacy Grimes  DOB: Jul 19, 1950  MRN: [id number]  Age / Sex: 71 y.o., female  PCP: Cyndi Bender, PA-C Referring Physician: Sidney Ace, MD  Reason for Consultation: Establishing goals of care  HPI/Patient Profile: 71 y.o. female with medical history significant of nonischemic cardiomyopathy with our UF 20-25%, status post AICD, COPD with chronic hypoxic respite failure on 3-4 L continuously at home, CVA, HTN, AAA, chronic normocytic anemia, presented with mechanical fall and right hip pain.  Clinical Assessment and Goals of Care: Notes and labs reviewed.  Patient has completed IMN of right hip. In to see patient.  No family at bedside. She is able to tell me her name that she is at the hospital and she is here because of injury from a fall.  She tells me that she lives at home with her husband, and has children.   We discussed her diagnoses, prognosis, GOC, EOL wishes disposition and options.  Created space and opportunity for patient  to explore thoughts and feelings regarding current medical information.   A detailed discussion was had today regarding advanced directives.  Concepts specific to code status, artifical feeding and hydration, IV antibiotics and rehospitalization were discussed.  The difference between an aggressive medical intervention path and a comfort care path was discussed.  Values and goals of care important to patient and family were attempted to be elicited.  Discussed limitations of medical interventions to prolong quality of life in some situations and discussed the concept of human mortality.  She would like full code /full scope care, and cannot identify limits to care at this time.  SUMMARY OF RECOMMENDATIONS   She would like full code full scope care at this time.  I would recommend outpatient palliative to  follow.       Primary Diagnoses: Present on Admission:  Hip fracture ([place])   I have reviewed the medical record, interviewed the patient and family, and examined the patient. The following aspects are pertinent.  Past Medical History:  Diagnosis Date   AAA (abdominal aortic aneurysm) (HCC)    Anginal pain ([place])    Anxiety    Arthritis    Automatic implantable cardioverter-defibrillator in situ    Biventricular ICD  [employer]    COPD (chronic obstructive pulmonary disease) (HCC)    Deafness    left ear   Dementia (HCC)    Depression    Dizziness    Dysrhythmia    Fracture    history of spinal fracture   GERD (gastroesophageal reflux disease)    HFrEF (heart failure with reduced ejection fraction) ([place])    a. 2010 Cath: nonobs dzs, EF 15-20%; b. 09/2017 Echo: EF suboptimal. EF low nl; c. 04/2018 Echo: EF 40-45%.   Hypertension    Hypothyroidism    Non-ischemic cardiomyopathy ([place])    a. 2010 Cath: nonobs dzs, EF 15-20%; b. 2011 s/p SJM CRT-D; c. 09/2016 s/p Gen change; d. 09/2017 Echo: EF suboptimal. EF low nl; e. 04/2018  Echo: EF 40-45%.   Oxygen dependent    Palpitations    Shortness of breath dyspnea    Sleep apnea    Wheezing    Social History   Socioeconomic History   Marital status: Married    Spouse name: Not on file   Number of children: 3   Years of education: Not on file   Highest education level: Not on file  Occupational History   Occupation: Retired    Fish farm manager: DISABLED  Tobacco Use   Smoking status: Former    Packs/day: 3.00    Years: 20.00    Total pack years: 60.00    Types: Cigarettes    Quit date: 02/04/2002    Years since quitting: 19.7   Smokeless tobacco: Never  Vaping Use   Vaping Use: Never used  Substance and Sexual Activity   Alcohol use: No   Drug use: No   Sexual activity: Not on file  Other Topics Concern   Not on file  Social History Narrative   Not on file   Social Determinants of Health   Financial Resource Strain: Not  on file  Food Insecurity: No Food Insecurity (10/24/2021)   Hunger Vital Sign    Worried About Running Out of Food in the Last Year: Never true    Ran Out of Food in the Last Year: Never true  Transportation Needs: No Transportation Needs (10/24/2021)   PRAPARE - Hydrologist (Medical): No    Lack of Transportation (Non-Medical): No  Physical Activity: Not on file  Stress: Not on file  Social Connections: Not on file   Family History  Problem Relation Age of Onset   Hypertension Mother    Breast cancer Mother    Stroke Mother    Heart disease Father    Heart attack Sister    Hypertension Brother    Heart attack Sister    Colon cancer Brother 62   Scheduled Meds:  allopurinol  100 mg Oral BID   atorvastatin  80 mg Oral QPM   Chlorhexidine Gluconate Cloth  6 each Topical Q0600   clopidogrel  75 mg Oral Daily   digoxin  0.125 mg Oral Once per day on Mon Wed Fri   docusate sodium  100 mg Oral BID   donepezil  10 mg Oral QHS   enoxaparin (LOVENOX) injection  0.5 mg/kg Subcutaneous Q24H   feeding supplement  237 mL Oral BID BM   gabapentin  300 mg Oral TID   guaiFENesin  1,200 mg Oral BID   insulin aspart  0-15 Units Subcutaneous TID WC   levothyroxine  100 mcg Oral Q0600   memantine  10 mg Oral BID   metoprolol tartrate  12.5 mg Oral BID   mirtazapine  7.5 mg Oral BID   mometasone-formoterol  2 puff Inhalation BID   multivitamin with minerals  1 tablet Oral Daily   pantoprazole  40 mg Oral Daily   predniSONE  40 mg Oral Q breakfast   tiotropium  18 mcg Inhalation Daily   venlafaxine XR  75 mg Oral BID   Continuous Infusions:  sodium chloride Stopped (10/23/21 1958)   sodium chloride Stopped (10/25/21 0830)   PRN Meds:.acetaminophen, albuterol, ALPRAZolam, bisacodyl, diphenhydrAMINE, HYDROcodone-acetaminophen, HYDROmorphone (DILAUDID) injection, hydrOXYzine, magnesium hydroxide, metoCLOPramide **OR** metoCLOPramide (REGLAN) injection,  ondansetron **OR** ondansetron (ZOFRAN) IV, mouth rinse, senna-docusate, sodium phosphate Medications Prior to Admission:  Prior to Admission medications   Medication Sig Start Date End Date Taking?  Authorizing Provider  allopurinol (ZYLOPRIM) 100 MG tablet Take 300 mg by mouth daily.   Yes [provider]  atorvastatin (LIPITOR) 80 MG tablet Take 1 tablet (80 mg total) by mouth daily. 07/12/21 07/07/22 Yes Wellington Hampshire, MD  clopidogrel (PLAVIX) 75 MG tablet Take 1 tablet (75 mg total) by mouth daily. 07/12/21  Yes Wellington Hampshire, MD  digoxin (LANOXIN) 0.125 MG tablet TAKE 1 TABLET BY MOUTH ON MONDAY, [facility] AND FRIDAY 10/10/21  Yes Wellington Hampshire, MD  donepezil (ARICEPT) 10 MG tablet Take 10 mg by mouth at bedtime.   Yes [provider]  furosemide (LASIX) 20 MG tablet Take 1 tablet (20 mg total) by mouth every other day. May take an additional tablet AS NEEDED for weight gain of 3 lbs overnight or 5 lbs in one week. Do not exceed more than 3 additional doses. - Oral 10/10/21  Yes Wellington Hampshire, MD  gabapentin (NEURONTIN) 300 MG capsule Take 300 mg by mouth 2 (two) times daily as needed (RLS symptoms/ pain).    Yes [provider]  hydrOXYzine (VISTARIL) 25 MG capsule Take 25 mg by mouth at bedtime as needed for anxiety.   Yes [provider]  levothyroxine (SYNTHROID) 112 MCG tablet Take 112 mcg by mouth daily before breakfast.   Yes [provider]  losartan (COZAAR) 25 MG tablet Take 0.5 tablets (12.5 mg total) by mouth daily. 07/12/21  Yes Wellington Hampshire, MD  memantine (NAMENDA) 10 MG tablet Take 10 mg by mouth in the morning and at bedtime. 07/10/21  Yes [provider]  metFORMIN (GLUCOPHAGE-XR) 500 MG 24 hr tablet Take 500 mg by mouth 2 (two) times daily with a meal.    Yes [provider]  metoprolol succinate (TOPROL-XL) 25 MG 24 hr tablet Take 25 mg by mouth daily.   Yes [provider]  omeprazole (PRILOSEC)  20 MG capsule Take 1 capsule (20 mg total) by mouth every morning. 04/12/18  Yes Gladstone Lighter, MD  potassium chloride SA (KLOR-CON M) 20 MEQ tablet Take 1 tablet (20 mEq total) by mouth daily. 07/12/21  Yes Wellington Hampshire, MD  venlafaxine XR (EFFEXOR-XR) 75 MG 24 hr capsule Take 75 mg by mouth 2 (two) times daily. 10/21/21  Yes [provider]  vitamin B-12 (CYANOCOBALAMIN) 1000 MCG tablet Take 1,000 mcg by mouth 2 (two) times daily.    Yes [provider]  albuterol (ACCUNEB) 0.63 MG/3ML nebulizer solution Take 1 ampule by nebulization every 6 (six) hours as needed for wheezing.    [provider]  albuterol (PROVENTIL HFA;VENTOLIN HFA) 108 (90 Base) MCG/ACT inhaler Inhale 2 puffs into the lungs every 6 (six) hours as needed. 11/02/16   Orbie Pyo, MD  budesonide-formoterol (SYMBICORT) 160-4.5 MCG/ACT inhaler Inhale 2 puffs into the lungs 2 (two) times daily.    [provider]  Menthol, Topical Analgesic, (BENGAY EX) Apply 1 application topically daily as needed (back pain).    [provider]  mirtazapine (REMERON) 7.5 MG tablet Take 7.5 mg by mouth 2 (two) times daily. 10/10/21   [provider]  Sennosides (EX-LAX PO) Take 2 tablets by mouth at bedtime as needed (constipation).    [provider]  tiotropium (SPIRIVA) 18 MCG inhalation capsule Place 1 capsule (18 mcg total) into inhaler and inhale daily as needed (shortness of breath). 04/12/18   Gladstone Lighter, MD   Allergies  Allergen Reactions   Codeine Palpitations   Contrast Media [  Iodinated Contrast Media] Rash   Review of Systems  All other systems reviewed and are negative.   Physical Exam Pulmonary:     Effort: Pulmonary effort is normal.  Skin:    Comments: Honeycomb dressing to right leg OpSite  Neurological:     Mental Status: She is alert.     Vital Signs: BP 129/61   Pulse 82   Temp 97.8 F (36.6 C) (Oral)   Resp 15   Ht 5\' 3"  (1.6  m)   Wt 91.5 kg   SpO2 95%   BMI 35.73 kg/m  Pain Scale: 0-10 POSS *See Group Information*: S-Acceptable,Sleep, easy to arouse Pain Score: Asleep   SpO2: SpO2: 95 % O2 Device:SpO2: 95 % O2 Flow Rate: .O2 Flow Rate (L/min): (S) 3 L/min  IO: Intake/output summary:  Intake/Output Summary (Last 24 hours) at 10/26/2021 1352 Last data filed at 10/26/2021 0900 Gross per 24 hour  Intake 120 ml  Output 1250 ml  Net -1130 ml    LBM: Last BM Date : 10/26/21 Baseline Weight: Weight: 95.3 kg Most recent weight: Weight: 91.5 kg      Signed by: Asencion Gowda, NP   Please contact Palliative Medicine Team phone at [phone] for questions and concerns.  For individual provider: See Shea Evans

## 2021-10-26 NOTE — NC FL2 (Signed)
[place] LEVEL OF CARE SCREENING TOOL     IDENTIFICATION  Patient Name: Stacy Grimes Birthdate: Jul 16, 1950 Sex: female Admission Date (Current Location): 10/23/2021  [facility] and [place] Number:  Engineering geologist and Address:  [facility], [address], [place], [place] [address]      Provider Number: [id number]  Attending Physician Name and Address:  Sidney Ace, MD  Relative Name and Phone Number:  Loran, Auguste (Spouse)   [phone] [facility])    Current Level of Care: Hospital Recommended Level of Care: [place] Prior Approval Number:    Date Approved/Denied:   PASRR Number: [id number]  Discharge Plan:      Current Diagnoses: Patient Active Problem List   Diagnosis Date Noted   Shock circulatory [facility]    Hip fracture, unspecified laterality, closed, initial encounter ([place]) 10/23/2021   Hip fracture ([place]) 10/23/2021   CHF (congestive heart failure) ([place]) 10/23/2021   Orthostatic hypotension 08/06/2021   Chronic congestive heart failure ([place])    Acute ischemic stroke ([place]) 06/21/2020   Chronic obstructive pulmonary disease, unspecified COPD type ([place]) 06/20/2020   Type 2 diabetes mellitus with hyperglycemia, unspecified whether long term insulin use ([place]) 06/20/2020   Left knee pain 12/16/2019   Elevated troponin I level 12/15/2019   Dehydration 04/12/2018   Syncope 04/09/2018   Benign neoplasm of colon 08/31/2012   Diverticulosis of colon (without mention of hemorrhage) 08/31/2012   Fatigue 08/10/2012   Personal history of colonic polyps 07/02/2012   Dysphagia, unspecified(787.20) 07/02/2012   Unspecified constipation 07/02/2012   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 07/09/2010   HYPERTENSION, BENIGN 03/24/2009   SLEEP APNEA, OBSTRUCTIVE 03/23/2009   Dilated cardiomyopathy ([place]) 03/23/2009   PALPITATIONS 03/23/2009   MURMUR 03/23/2009   COUGH 03/23/2009     Orientation RESPIRATION BLADDER Height & Weight        O2 Incontinent, External catheter Weight: 201 lb 11.5 oz (91.5 kg) Height:  5\' 3"  (160 cm)  BEHAVIORAL SYMPTOMS/MOOD NEUROLOGICAL BOWEL NUTRITION STATUS        Diet (regular)  AMBULATORY STATUS COMMUNICATION OF NEEDS Skin   Extensive Assist Verbally Bruising, Surgical wounds (surgical incision R hip, R leg)                       Personal Care Assistance Level of Assistance  Bathing, Feeding, Dressing Bathing Assistance: Maximum assistance Feeding assistance: Maximum assistance Dressing Assistance: Maximum assistance     Functional Limitations Info             SPECIAL CARE FACTORS FREQUENCY  PT (By licensed PT), OT (By licensed OT)     PT Frequency: 5 times per week OT Frequency: 5 times per week            Contractures      Additional Factors Info  Code Status, Allergies Code Status Info: full Allergies Info: Codeine, Contrast Media (iodinated Contrast Media)           Current Medications (10/26/2021):  This is the current hospital active medication list Current Facility-Administered Medications  Medication Dose Route Frequency Provider Last Rate Last Admin   0.9 %  sodium chloride infusion  250 mL Intravenous Continuous Dallie Piles, RPH   Held at 10/23/21 1958   0.9 %  sodium chloride infusion  250 mL Intravenous Continuous Flora Lipps, MD   Stopped at 10/25/21 0830   acetaminophen (TYLENOL) tablet 650 mg  650 mg Oral Q6H PRN Sreenath,  Sudheer B, MD       albuterol (PROVENTIL) (2.5 MG/3ML) 0.083% nebulizer solution 3 mL  3 mL Nebulization Q6H PRN Poggi, Marshall Cork, MD       allopurinol (ZYLOPRIM) tablet 100 mg  100 mg Oral BID Poggi, Marshall Cork, MD   100 mg at 10/26/21 0910   ALPRAZolam (XANAX) tablet 0.25 mg  0.25 mg Oral BID PRN Ralene Muskrat B, MD   0.25 mg at 10/26/21 0515   atorvastatin (LIPITOR) tablet 80 mg  80 mg Oral QPM Poggi, Marshall Cork, MD   80 mg at 10/25/21 2146   bisacodyl (DULCOLAX)  suppository 10 mg  10 mg Rectal Daily PRN Poggi, Marshall Cork, MD       Chlorhexidine Gluconate Cloth 2 % PADS 6 each  6 each Topical Q0600 Lequita Halt, MD   6 each at 10/26/21 0515   clopidogrel (PLAVIX) tablet 75 mg  75 mg Oral Daily Ralene Muskrat B, MD   75 mg at 10/26/21 0908   digoxin (LANOXIN) tablet 0.125 mg  0.125 mg Oral Once per day on Mon Wed Fri Poggi, John J, MD   0.125 mg at 10/26/21 1610   diphenhydrAMINE (BENADRYL) 12.5 MG/5ML elixir 12.5-25 mg  12.5-25 mg Oral Q4H PRN Poggi, Marshall Cork, MD       docusate sodium (COLACE) capsule 100 mg  100 mg Oral BID Corky Mull, MD   100 mg at 10/26/21 0908   donepezil (ARICEPT) tablet 10 mg  10 mg Oral QHS Poggi, Marshall Cork, MD   10 mg at 10/25/21 2141   enoxaparin (LOVENOX) injection 47.5 mg  0.5 mg/kg Subcutaneous Q24H Poggi, Marshall Cork, MD   47.5 mg at 10/26/21 0915   feeding supplement (ENSURE ENLIVE / ENSURE PLUS) liquid 237 mL  237 mL Oral BID BM Ralene Muskrat B, MD   237 mL at 10/26/21 0911   gabapentin (NEURONTIN) capsule 300 mg  300 mg Oral TID Ralene Muskrat B, MD   300 mg at 10/26/21 0912   guaiFENesin (MUCINEX) 12 hr tablet 1,200 mg  1,200 mg Oral BID Corky Mull, MD   1,200 mg at 10/26/21 0911   HYDROcodone-acetaminophen (NORCO) 7.5-325 MG per tablet 1-2 tablet  1-2 tablet Oral Q4H PRN Poggi, Marshall Cork, MD   2 tablet at 10/26/21 0911   HYDROmorphone (DILAUDID) injection 0.5 mg  0.5 mg Intravenous Q2H PRN Poggi, Marshall Cork, MD   0.5 mg at 10/23/21 2041   hydrOXYzine (ATARAX) tablet 25 mg  25 mg Oral QHS PRN Poggi, Marshall Cork, MD       insulin aspart (novoLOG) injection 0-15 Units  0-15 Units Subcutaneous TID WC Poggi, Marshall Cork, MD   5 Units at 10/25/21 1148   levothyroxine (SYNTHROID) tablet 100 mcg  100 mcg Oral Q0600 Corky Mull, MD   100 mcg at 10/26/21 0514   magnesium hydroxide (MILK OF MAGNESIA) suspension 30 mL  30 mL Oral Daily PRN Poggi, Marshall Cork, MD       memantine [facility] [place]) tablet 10 mg  10 mg Oral BID Poggi, Marshall Cork, MD   10 mg at  10/26/21 0914   metoCLOPramide (REGLAN) tablet 5-10 mg  5-10 mg Oral Q8H PRN Poggi, Marshall Cork, MD       Or   metoCLOPramide (REGLAN) injection 5-10 mg  5-10 mg Intravenous Q8H PRN Poggi, Marshall Cork, MD   10 mg at 10/23/21 1954   metoprolol tartrate (LOPRESSOR) tablet 12.5 mg  12.5 mg Oral  BID Ralene Muskrat B, MD   12.5 mg at 10/26/21 8938   mirtazapine (REMERON) tablet 7.5 mg  7.5 mg Oral BID Ralene Muskrat B, MD   7.5 mg at 10/26/21 0908   mometasone-formoterol (DULERA) 200-5 MCG/ACT inhaler 2 puff  2 puff Inhalation BID Poggi, Marshall Cork, MD   2 puff at 10/26/21 0900   multivitamin with minerals tablet 1 tablet  1 tablet Oral Daily Ralene Muskrat B, MD   1 tablet at 10/26/21 0908   ondansetron (ZOFRAN) tablet 4 mg  4 mg Oral Q6H PRN Poggi, Marshall Cork, MD       Or   ondansetron (ZOFRAN) injection 4 mg  4 mg Intravenous Q6H PRN Poggi, Marshall Cork, MD       Oral care mouth rinse  15 mL Mouth Rinse PRN Rust-Chester, Huel Cote, NP       pantoprazole (PROTONIX) EC tablet 40 mg  40 mg Oral Daily Poggi, Marshall Cork, MD   40 mg at 10/26/21 1017   predniSONE (DELTASONE) tablet 40 mg  40 mg Oral Q breakfast Ralene Muskrat B, MD   40 mg at 10/26/21 0900   senna-docusate (Senokot-S) tablet 1 tablet  1 tablet Oral QHS PRN Poggi, Marshall Cork, MD       sodium phosphate (FLEET) 7-19 GM/118ML enema 1 enema  1 enema Rectal Once PRN Poggi, Marshall Cork, MD       tiotropium (SPIRIVA) inhalation capsule ([facility] use ONLY) 18 mcg  18 mcg Inhalation Daily Poggi, Marshall Cork, MD   18 mcg at 10/26/21 0914   venlafaxine XR (EFFEXOR-XR) 24 hr capsule 75 mg  75 mg Oral BID Corky Mull, MD   75 mg at 10/26/21 5102     Discharge Medications: Please see discharge summary for a list of discharge medications.  Relevant Imaging Results:  Relevant Lab Results:   Additional Information SS #: [id number]  [place], LCSW

## 2021-10-26 NOTE — TOC Initial Note (Addendum)
Transition of Care [facility]) - Initial/Assessment Note    Patient Details  Name: Stacy Grimes MRN: [id number] Date of Birth: [birth year]  [facility] CM/SW Contact:    Magnus Ivan, LCSW Phone Number: 10/26/2021, 10:26 AM  Clinical Narrative:                 Spoke with spouse via phone.  Patient lives with spouse and he drives her to appointments. PCP is Cyndi Bender PA. Pharmacy is [facility]. Patient has a RW, rollator, and cane at home. Per chart review, also has home o2. Patient had [facility] in the past, unsure of agency.  Explained PT/OT rec for SNF. Patient's spouse is agreeable to SNF. Does not want [facility], stated she went there in the past. CSW started SNF work up, sent to SNFs locally and near [place] (patient's home address).   Expected Discharge Plan: Skilled Nursing Facility Barriers to Discharge: Continued Medical Work up   Patient Goals and CMS Choice Patient states their goals for this hospitalization and ongoing recovery are:: SNF CMS Medicare.gov Compare Post Acute Care list provided to:: Patient Represenative (must comment) Choice offered to / list presented to : Spouse  Expected Discharge Plan and Services Expected Discharge Plan: [place]       Living arrangements for the past 2 months: Single Family Home                                      Prior Living Arrangements/Services Living arrangements for the past 2 months: Single Family Home Lives with:: Spouse Patient language and need for interpreter reviewed:: Yes Do you feel safe going back to the place where you live?: Yes      Need for Family Participation in Patient Care: Yes (Comment) Care giver support system in place?: Yes (comment) Current home services: DME Criminal Activity/Legal Involvement Pertinent to Current Situation/Hospitalization: No - Comment as needed  Activities of Daily Living Home Assistive Devices/Equipment: None ADL Screening (condition  at time of admission) Patient's cognitive ability adequate to safely complete daily activities?: No Is the patient deaf or have difficulty hearing?: Yes Does the patient have difficulty seeing, even when wearing glasses/contacts?: No Does the patient have difficulty concentrating, remembering, or making decisions?: Yes Patient able to express need for assistance with ADLs?: Yes Does the patient have difficulty dressing or bathing?: Yes Independently performs ADLs?: No Communication: Independent Dressing (OT): Needs assistance Is this a change from baseline?: Pre-admission baseline Grooming: Needs assistance Is this a change from baseline?: Pre-admission baseline Feeding: Independent Bathing: Needs assistance Is this a change from baseline?: Pre-admission baseline Toileting: Needs assistance Is this a change from baseline?: Pre-admission baseline In/Out Bed: Needs assistance Is this a change from baseline?: Pre-admission baseline Walks in Home: Needs assistance Is this a change from baseline?: Pre-admission baseline Does the patient have difficulty walking or climbing stairs?: Yes Weakness of Legs: Both Weakness of Arms/Hands: Both  Permission Sought/Granted Permission sought to share information with : Facility Art therapist granted to share information with : Yes, Verbal Permission Granted (by husband, patient disoriented)     Permission granted to share info w AGENCY: SNFs        Emotional Assessment       Orientation: : Fluctuating Orientation (Suspected and/or reported [place]) Alcohol / Substance Use: Not Applicable Psych Involvement: No (comment)  Admission diagnosis:  Hip fracture ([place]) [  S72.009A] Right hip pain [M25.551] Right foot pain [M79.671] Fall, initial encounter [W19.XXXA] CHF (congestive heart failure) ([place]) [I50.9] Patient Active Problem List   Diagnosis Date Noted   Shock circulatory ([place])    Hip fracture, unspecified  laterality, closed, initial encounter ([place]) 10/23/2021   Hip fracture ([place]) 10/23/2021   CHF (congestive heart failure) ([place]) 10/23/2021   Orthostatic hypotension 08/06/2021   Chronic congestive heart failure ([place])    Acute ischemic stroke ([place]) 06/21/2020   Chronic obstructive pulmonary disease, unspecified COPD type ([place]) 06/20/2020   Type 2 diabetes mellitus with hyperglycemia, unspecified whether long term insulin use ([place]) 06/20/2020   Left knee pain 12/16/2019   Elevated troponin I level 12/15/2019   Dehydration 04/12/2018   Syncope 04/09/2018   Benign neoplasm of colon 08/31/2012   Diverticulosis of colon (without mention of hemorrhage) 08/31/2012   Fatigue 08/10/2012   Personal history of colonic polyps 07/02/2012   Dysphagia, unspecified(787.20) 07/02/2012   Unspecified constipation 07/02/2012   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 [address] [address] [place]  [address] Phone: [phone] Fax: [phone]     Social Determinants of Health (SDOH) Interventions    Readmission Risk Interventions     No data to display

## 2021-10-26 NOTE — Plan of Care (Signed)
  Problem: Education: Goal: Ability to describe self-care measures that may prevent or decrease complications (Diabetes Survival Skills Education) will improve Outcome: Not Progressing   Problem: Pain Managment: Goal: General experience of comfort will improve Outcome: Progressing   Problem: Safety: Goal: Ability to remain free from injury will improve Outcome: Not Progressing

## 2021-10-26 NOTE — Progress Notes (Signed)
Physical Therapy Treatment Patient Details Name: Stacy Grimes MRN: [id number] DOB: November 20, 1950 Today's Date: 10/26/2021   History of Present Illness Pt is a 71 yo female s/p hip IM nailing due to a fall. Transferred to ICU for hypotension. PMH of HTN, CHF, COPD, cardiac defibrillator, DM, AAA, dementia, spinal fx, O2 use, CVA, L ear deafness.    PT Comments    Pt seen for PT tx with pt agreeable to tx. Pt is premedicated but c/o significant R hip pain with movement. Pt requires extra time to upright trunk but max assist to fully scoot to sitting EOB. Pt endorses slight lightheadedness with sitting EOB & requires cuing for pursed lip breathing to increase SpO2. Pt tolerated sitting EOB ~5 minutes but limited further by low BP so pt assisted back to supine. Pt is able to scoot to [facility] with extra time & use of bed rails. Notified nurse of pt's low BP.  BP checked in RUE Sitting EOB 98/35 mmHg MAP 48 Supine 89/49 mmHg MAP 62    Recommendations for follow up therapy are one component of a multi-disciplinary discharge planning process, led by the attending physician.  Recommendations may be updated based on patient status, additional functional criteria and insurance authorization.  Follow Up Recommendations  Skilled nursing-short term rehab (<3 hours/day) Can patient physically be transported by private vehicle: No   Assistance Recommended at Discharge Frequent or constant Supervision/Assistance  Patient can return home with the following Two people to help with walking and/or transfers;Two people to help with bathing/dressing/bathroom;Help with stairs or ramp for entrance;Assistance with feeding;Assist for transportation;Direct supervision/assist for financial management;Assistance with cooking/housework   Equipment Recommendations   (TBD in next venue)    Recommendations for Other Services       Precautions / Restrictions Precautions Precautions: Fall Restrictions Weight Bearing  Restrictions: Yes RLE Weight Bearing: Weight bearing as tolerated     Mobility  Bed Mobility Overal bed mobility: Needs Assistance Bed Mobility: Supine to Sit, Sit to Supine, Rolling Rolling: Supervision (extra time, use of bed rails, limited by R hip pain)   Supine to sit: Mod assist, Min assist Sit to supine: Max assist   General bed mobility comments: max assist to fully scoot to sitting EOB once upright    Transfers                        Ambulation/Gait                   Stairs             Wheelchair Mobility    Modified Rankin (Stroke Patients Only)       Balance Overall balance assessment: Needs assistance Sitting-balance support: Feet supported, Bilateral upper extremity supported Sitting balance-Leahy Scale: Fair Sitting balance - Comments: supervision static sitting                                    Cognition Arousal/Alertness: Awake/alert Behavior During Therapy: WFL for tasks assessed/performed                                   General Comments: pt is HOH, repeats the same sentences multiple times during session, question recall/memory        Exercises      General Comments  Pertinent Vitals/Pain Pain Assessment Pain Assessment: Faces Faces Pain Scale: Hurts even more Pain Location: R hip with movement Pain Descriptors / Indicators: Discomfort, Grimacing, Guarding Pain Intervention(s): Monitored during session, Premedicated before session    Home Living                          Prior Function            PT Goals (current goals can now be found in the care plan section) Acute Rehab PT Goals Patient Stated Goal: to have less pain PT Goal Formulation: With patient Time For Goal Achievement: 11/07/21 Potential to Achieve Goals: Good Progress towards PT goals: Progressing toward goals    Frequency    7X/week      PT Plan Current plan remains appropriate     Co-evaluation              AM-PAC PT "6 Clicks" Mobility   Outcome Measure  Help needed turning from your back to your side while in a flat bed without using bedrails?: A Little Help needed moving from lying on your back to sitting on the side of a flat bed without using bedrails?: A Lot Help needed moving to and from a bed to a chair (including a wheelchair)?: A Lot Help needed standing up from a chair using your arms (e.g., wheelchair or bedside chair)?: A Lot Help needed to walk in hospital room?: Total Help needed climbing 3-5 steps with a railing? : Total 6 Click Score: 11    End of Session Equipment Utilized During Treatment: Oxygen (4L/min via nasal cannula throughout session) Activity Tolerance: Treatment limited secondary to medical complications (Comment) (limited by low BP) Patient left: in bed;with call bell/phone within reach;with bed alarm set;with SCD's reapplied Nurse Communication: Mobility status (BP) PT Visit Diagnosis: Other abnormalities of gait and mobility (R26.89);Difficulty in walking, not elsewhere classified (R26.2);Muscle weakness (generalized) (M62.81);Pain Pain - Right/Left: Right Pain - part of body: Hip     Time: 5681-2751 PT Time Calculation (min) (ACUTE ONLY): 17 min  Charges:  $Therapeutic Activity: 8-22 mins                     Stacy Grimes, PT, DPT 10/26/21, 12:24 PM    Stacy Grimes 10/26/2021, 12:22 PM

## 2021-10-26 NOTE — Progress Notes (Signed)
PROGRESS NOTE    Stacy Grimes  TIW:[id number] DOB: 24-Aug-1950 DOA: 10/23/2021 PCP: Cyndi Bender, PA-C    Brief Narrative:  71 y.o. female with medical history significant of nonischemic cardiomyopathy with our UF 20-25%, status post AICD, COPD with chronic hypoxic respite failure on 3-4 L continuously at home, CVA, HTN, AAA, chronic normocytic anemia, presented with mechanical fall and right hip pain.   Appears that patient has a chronic right hip/knee pain, and occasionally she has had episodes of " right leg give up" with near fall and falls.  She had at least 2-3 episode before today.  This morning, patient went to the bathroom, while trying to stand up from toilet bowl, she again felt right leg weakness and fell on the right side and hit her hip.  She denies any prodrome of lightheadedness chest pain dizziness shortness of breath.   She has a chronic O2 dependent on 3 to 4 L, but over the weekend, patient developed a new nonproductive cough and wheezing, family has turned oxygen to 5 L yesterday.  Denies any chest pain no fever chills.  CT pelvis showed right femoral comminuted fracture with intertrochanteric and cervical components.  Taken operating room with orthopedics on 9/19.  Tolerated procedure well however postoperatively became hypotensive requiring initiation of vasopressors.  Was transferred to stepdown unit with PCCM consultation.  Following morning maps are improving.  PCCM remains engaged.  Patient switched to Levophed from Neo-Synephrine.  Remains on 5 L.  9/21: Discussed with PCCM.  Vasopressors weaned off.  PCCM signing off.  Blood pressure stabilized.  9/22: Patient's clinical status starting to improve.  Oxygen weaned to baseline 3 L.  Mental status improved this morning.  Patient endorsing pain in affected extremity.  Assessment & Plan:   Principal Problem:   Hip fracture ([place]) Active Problems:   Hip fracture, unspecified laterality, closed, initial encounter  ([place])   CHF (congestive heart failure) (HCC)   Shock circulatory (HCC)  Postoperative hypotension/shock, resolved Unclear etiology.  Suspect related to hypovolemia due to intraoperative blood loss.  Cardiogenic shock versus iatrogenic related to pain medication and anesthesia remains on differential Wean off vasopressors 9/21 Plan: Transfer to medical surgical unit Low-dose beta-blockers DC midodrine  Acute COPD exacerbation Acute on chronic hypoxic respiratory failure -Active wheezing cough, increasing oxygen demand -No clearly infiltrates or consolidation on chest x-ray afebrile, no leukocytosis Respiratory status improved Plan: Continue prednisone 40 mg a day x5 days (2/5) Bronchodilators Wean oxygen as tolerated, currently on 3 L baseline   Chronic HFrEF -Euvolemic -Lasix on hold in setting of shock/hypotension -No IVF -At this time blood pressure cannot tolerate addition of Lasix.  We will reattempt to start tomorrow   Right proximal femoral comminuted fracture -Tolerated procedure well  -Postop hypotension noted -Orthopedic follow-up for wound check -Therapy as tolerated -TOC consult for skilled nursing facility placement   PAF -On sinus rhythm, continue digoxin Monday Wednesday Friday -Beta-blocker resumed   IIDM -Site scale for now   Diabetic neuropathy -Continue gabapentin   Hx of CVA -Continue aspirin, restarted Plavix 9/21   Hypothyroidism, early dementia -Stable  Acute kidney injury Suspect secondary to ATN versus prerenal azotemia IVF discontinued Daily labs Avoid nonessential nephrotoxins   DVT prophylaxis: SQ Lovenox Code Status: Full Family Communication: Sela Hilding [phone] on 9/20, 9/22 Disposition Plan: Status is: Inpatient Remains inpatient appropriate because: Hip fracture postoperative hypotension   Level of care: Med-Surg  Consultants:  PCCM Orthopedics  Procedures:  Hip  ORIF  Antimicrobials: None   Subjective: Seen and examined.  Endorses pain and right leg this morning.  More lucid  Objective: Vitals:   10/26/21 0900 10/26/21 0956 10/26/21 1000 10/26/21 1200  BP: (!) 120/57  (!) 106/56 111/71  Pulse: 85 76 78 81  Resp: 15 13 10 11   Temp:      TempSrc:      SpO2: 95% 97% 95% 98%  Weight:      Height:        Intake/Output Summary (Last 24 hours) at 10/26/2021 1315 Last data filed at 10/26/2021 0900 Gross per 24 hour  Intake 120 ml  Output 1250 ml  Net -1130 ml   Filed Weights   10/23/21 1855 10/25/21 0259 10/26/21 0500  Weight: 95.3 kg 95.8 kg 91.5 kg    Examination:  General exam: No distress Respiratory system: Clear to auscultation. Respiratory effort normal. Cardiovascular system: S1-S2, distant heart sounds, no murmurs, no pedal edema Gastrointestinal system: Soft, NT/ND, normal bowel sounds Central nervous system: Alert, oriented x3, no focal deficits Extremities: Right hip surgical site, dressing CDI Skin: No rashes, lesions or ulcers Psychiatry: Judgement and insight appear normal. Mood & affect appropriate.     Data Reviewed: I have personally reviewed following labs and imaging studies  CBC: Recent Labs  Lab 10/23/21 0451 10/23/21 1939 10/24/21 0209 10/25/21 0326 10/26/21 0549  WBC 8.4 15.7* 12.1* 12.7* 12.6*  NEUTROABS  --   --   --  10.7* 10.2*  HGB 9.1* 7.3* 9.5* 7.9* 8.0*  HCT 32.0* 26.1* 32.7* 26.7* 27.5*  MCV 79.2* 80.6 83.4 83.7 83.1  PLT 287 279 247 190 696   Basic Metabolic Panel: Recent Labs  Lab 10/23/21 0451 10/23/21 1939 10/24/21 0209 10/25/21 0326 10/26/21 0549  NA 145 143 146* 142 142  K 4.3 3.6 4.1 4.0 4.1  CL 103 100 105 104 104  CO2 34* 31 33* 30 32  GLUCOSE 119* 138* 119* 117* 143*  BUN 13 15 18 19 19   CREATININE 0.88 1.18* 1.27* 0.96 0.87  CALCIUM 7.9* 7.9* 7.7* 7.7* 8.3*  MG  --  1.4* 2.3 2.6* 2.5*  PHOS  --  4.2 5.9* 3.2 2.4*   GFR: Estimated Creatinine Clearance: 62.7  mL/min (by C-G formula based on SCr of 0.87 mg/dL). Liver Function Tests: Recent Labs  Lab 10/23/21 1939  ALBUMIN 2.5*   No results for input(s): "LIPASE", "AMYLASE" in the last 168 hours. No results for input(s): "AMMONIA" in the last 168 hours. Coagulation Profile: No results for input(s): "INR", "PROTIME" in the last 168 hours. Cardiac Enzymes: No results for input(s): "CKTOTAL", "CKMB", "CKMBINDEX", "TROPONINI" in the last 168 hours. BNP (last 3 results) No results for input(s): "PROBNP" in the last 8760 hours. HbA1C: No results for input(s): "HGBA1C" in the last 72 hours.  CBG: Recent Labs  Lab 10/25/21 1111 10/25/21 1806 10/25/21 2151 10/26/21 0749 10/26/21 1129  GLUCAP 204* 219* 132* 91 139*   Lipid Profile: No results for input(s): "CHOL", "HDL", "LDLCALC", "TRIG", "CHOLHDL", "LDLDIRECT" in the last 72 hours. Thyroid Function Tests: No results for input(s): "TSH", "T4TOTAL", "FREET4", "T3FREE", "THYROIDAB" in the last 72 hours. Anemia Panel: No results for input(s): "VITAMINB12", "FOLATE", "FERRITIN", "TIBC", "IRON", "RETICCTPCT" in the last 72 hours.  Sepsis Labs: Recent Labs  Lab 10/24/21 0209 10/24/21 0428 10/24/21 0828 10/24/21 1106 10/24/21 1320 10/25/21 0326 10/26/21 0549  PROCALCITON 0.63  --   --   --   --  0.52 0.70  LATICACIDVEN  --  2.5*  2.7* 3.3* 3.3*  --   --     Recent Results (from the past 240 hour(s))  MRSA Next Gen by PCR, Nasal     Status: None   Collection Time: 10/23/21  7:01 PM   Specimen: Nasal Mucosa; Nasal Swab  Result Value Ref Range Status   MRSA by PCR Next Gen NOT DETECTED NOT DETECTED Final    Comment: (NOTE) The GeneXpert MRSA Assay (FDA approved for NASAL specimens only), is one component of a comprehensive MRSA colonization surveillance program. It is not intended to diagnose MRSA infection nor to guide or monitor treatment for MRSA infections. Test performance is not FDA approved in patients less than 46  years old. Performed at [facility], [address], [place], [place] [address]          Radiology Studies: No results found.      Scheduled Meds:  allopurinol  100 mg Oral BID   atorvastatin  80 mg Oral QPM   Chlorhexidine Gluconate Cloth  6 each Topical Q0600   clopidogrel  75 mg Oral Daily   digoxin  0.125 mg Oral Once per day on Mon Wed Fri   docusate sodium  100 mg Oral BID   donepezil  10 mg Oral QHS   enoxaparin (LOVENOX) injection  0.5 mg/kg Subcutaneous Q24H   feeding supplement  237 mL Oral BID BM   gabapentin  300 mg Oral TID   guaiFENesin  1,200 mg Oral BID   insulin aspart  0-15 Units Subcutaneous TID WC   levothyroxine  100 mcg Oral Q0600   memantine  10 mg Oral BID   metoprolol tartrate  12.5 mg Oral BID   mirtazapine  7.5 mg Oral BID   mometasone-formoterol  2 puff Inhalation BID   multivitamin with minerals  1 tablet Oral Daily   pantoprazole  40 mg Oral Daily   predniSONE  40 mg Oral Q breakfast   tiotropium  18 mcg Inhalation Daily   venlafaxine XR  75 mg Oral BID   Continuous Infusions:  sodium chloride Stopped (10/23/21 1958)   sodium chloride Stopped (10/25/21 0830)     LOS: 3 days     Sidney Ace, MD [facility]   If 7PM-7AM, please contact night-coverage  10/26/2021, 1:15 PM

## 2021-10-27 DIAGNOSIS — S72001D Fracture of unspecified part of neck of right femur, subsequent encounter for closed fracture with routine healing: Secondary | ICD-10-CM | POA: Diagnosis not present

## 2021-10-27 LAB — GLUCOSE, CAPILLARY
Glucose-Capillary: 103 mg/dL — ABNORMAL HIGH (ref 70–99)
Glucose-Capillary: 144 mg/dL — ABNORMAL HIGH (ref 70–99)
Glucose-Capillary: 194 mg/dL — ABNORMAL HIGH (ref 70–99)
Glucose-Capillary: 133 mg/dL — ABNORMAL HIGH (ref 70–99)

## 2021-10-27 LAB — CBC WITH DIFFERENTIAL/PLATELET
Abs Immature Granulocytes: 0.07 10*3/uL (ref 0.00–0.07)
Basophils Absolute: 0 10*3/uL (ref 0.0–0.1)
Basophils Relative: 0 %
Eosinophils Absolute: 0 10*3/uL (ref 0.0–0.5)
Eosinophils Relative: 0 %
HCT: 23.8 % — ABNORMAL LOW (ref 36.0–46.0)
Hemoglobin: 7.1 g/dL — ABNORMAL LOW (ref 12.0–15.0)
Immature Granulocytes: 1 %
Lymphocytes Relative: 15 %
Lymphs Abs: 1.5 10*3/uL (ref 0.7–4.0)
MCH: 24.4 pg — ABNORMAL LOW (ref 26.0–34.0)
MCHC: 29.8 g/dL — ABNORMAL LOW (ref 30.0–36.0)
MCV: 81.8 fL (ref 80.0–100.0)
Monocytes Absolute: 1 10*3/uL (ref 0.1–1.0)
Monocytes Relative: 10 %
Neutro Abs: 7.5 10*3/uL (ref 1.7–7.7)
Neutrophils Relative %: 74 %
Platelets: 238 10*3/uL (ref 150–400)
RBC: 2.91 MIL/uL — ABNORMAL LOW (ref 3.87–5.11)
RDW: 20.2 % — ABNORMAL HIGH (ref 11.5–15.5)
WBC: 10.1 10*3/uL (ref 4.0–10.5)
nRBC: 0.8 % — ABNORMAL HIGH (ref 0.0–0.2)

## 2021-10-27 LAB — PHOSPHORUS: Phosphorus: 2.8 mg/dL (ref 2.5–4.6)

## 2021-10-27 LAB — BASIC METABOLIC PANEL
Anion gap: 6 (ref 5–15)
BUN: 16 mg/dL (ref 8–23)
CO2: 32 mmol/L (ref 22–32)
Calcium: 8.3 mg/dL — ABNORMAL LOW (ref 8.9–10.3)
Chloride: 106 mmol/L (ref 98–111)
Creatinine, Ser: 0.69 mg/dL (ref 0.44–1.00)
GFR, Estimated: >60 mL/min (ref >60)
Glucose, Bld: 86 mg/dL (ref 70–99)
Potassium: 4.5 mmol/L (ref 3.5–5.1)
Sodium: 144 mmol/L (ref 135–145)

## 2021-10-27 LAB — MAGNESIUM: Magnesium: 2.3 mg/dL (ref 1.7–2.4)

## 2021-10-27 LAB — HEMOGLOBIN AND HEMATOCRIT, BLOOD
HCT: 27.3 % — ABNORMAL LOW (ref 36.0–46.0)
Hemoglobin: 8.4 g/dL — ABNORMAL LOW (ref 12.0–15.0)

## 2021-10-27 LAB — PREPARE RBC (CROSSMATCH)

## 2021-10-27 MED ORDER — METHOCARBAMOL 500 MG PO TABS
500.0000 mg | ORAL_TABLET | Freq: Three times a day (TID) | ORAL | Status: DC
  Administered 2021-10-27 (×3): 500 mg via ORAL
  Filled 2021-10-27 (×3): qty 1

## 2021-10-27 MED ORDER — SODIUM CHLORIDE 0.9% IV SOLUTION: Freq: Once | INTRAVENOUS | Status: AC

## 2021-10-27 MED ORDER — GABAPENTIN 400 MG PO CAPS
400.0000 mg | ORAL_CAPSULE | Freq: Three times a day (TID) | ORAL | Status: DC
  Administered 2021-10-27 – 2021-10-30 (×9): 400 mg via ORAL
  Filled 2021-10-27 (×9): qty 1

## 2021-10-27 MED ORDER — FUROSEMIDE 20 MG PO TABS
20.0000 mg | ORAL_TABLET | ORAL | Status: DC
  Administered 2021-10-27 – 2021-10-29 (×2): 20 mg via ORAL
  Filled 2021-10-27 (×2): qty 1

## 2021-10-27 NOTE — Progress Notes (Addendum)
Subjective: 4 Days Post-Op Procedure(s) (LRB): INTRAMEDULLARY (IM) NAIL INTERTROCHANTERIC (Right) Patient's pain is moderate this morning.  She denies any chest pain, shortness of breath, dizziness or lightheadedness.  No abdominal pain. Patient is on chronic O2, requiring 3-4L at baseline. PT and care management to assist with discharge planning following surgery.  Patient will likely need SNF upon discharge.  Objective: Vital signs in last 24 hours: Temp:  [97.2 F (36.2 C)-98 F (36.7 C)] 97.9 F (36.6 C) (09/23 0521) Pulse Rate:  [74-100] 91 (09/23 0521) Resp:  [10-18] 18 (09/23 0521) BP: (106-151)/(48-88) 136/53 (09/23 0521) SpO2:  [93 %-100 %] 97 % (09/23 0521) Weight:  [95.4 kg] 95.4 kg (09/23 0500)  Intake/Output from previous day:  Intake/Output Summary (Last 24 hours) at 10/27/2021 0650 Last data filed at 10/27/2021 0541 Gross per 24 hour  Intake 120 ml  Output 450 ml  Net -330 ml    Intake/Output this shift: Total I/O In: -  Out: 450 [Urine:450]  Labs: Recent Labs    10/25/21 0326 10/26/21 0549 10/27/21 0437  HGB 7.9* 8.0* 7.1*   Recent Labs    10/26/21 0549 10/27/21 0437  WBC 12.6* 10.1  RBC 3.31* 2.91*  HCT 27.5* 23.8*  PLT 220 238   Recent Labs    10/26/21 0549 10/27/21 0437  NA 142 144  K 4.1 4.5  CL 104 106  CO2 32 32  BUN 19 16  CREATININE 0.87 0.69  GLUCOSE 143* 86  CALCIUM 8.3* 8.3*   No results for input(s): "LABPT", "INR" in the last 72 hours.   EXAM General - Patient is Alert, Appropriate, and Oriented Extremity - ABD soft Sensation intact distally Dorsiflexion/Plantar flexion intact Incision: dressing C/D/I No cellulitis present Compartment soft Dressing/Incision - honeycomb dressings intact without drainage to the right leg this morning. Motor Function - intact, moving foot and toes well on exam.    Past Medical History:  Diagnosis Date   AAA (abdominal aortic aneurysm) (HCC)    Anginal pain ([place])    Anxiety     Arthritis    Automatic implantable cardioverter-defibrillator in situ    Biventricular ICD  [facility]    COPD (chronic obstructive pulmonary disease) (HCC)    Deafness    left ear   Dementia (HCC)    Depression    Dizziness    Dysrhythmia    Fracture    history of spinal fracture   GERD (gastroesophageal reflux disease)    HFrEF (heart failure with reduced ejection fraction) ([place])    a. 2010 Cath: nonobs dzs, EF 15-20%; b. 09/2017 Echo: EF suboptimal. EF low nl; c. 04/2018 Echo: EF 40-45%.   Hypertension    Hypothyroidism    Non-ischemic cardiomyopathy ([place])    a. 2010 Cath: nonobs dzs, EF 15-20%; b. 2011 s/p SJM CRT-D; c. 09/2016 s/p Gen change; d. 09/2017 Echo: EF suboptimal. EF low nl; e. 04/2018 Echo: EF 40-45%.   Oxygen dependent    Palpitations    Shortness of breath dyspnea    Sleep apnea    Wheezing     Assessment/Plan: 4 Days Post-Op Procedure(s) (LRB): INTRAMEDULLARY (IM) NAIL INTERTROCHANTERIC (Right) Principal Problem:   Hip fracture (HCC) Active Problems:   Hip fracture, unspecified laterality, closed, initial encounter (HCC)   CHF (congestive heart failure) (HCC)   Shock circulatory (HCC)  Estimated body mass index is 37.26 kg/m as calculated from the following:   Height as of this encounter: 5\' 3"  (1.6 m).   Weight as of  this encounter: 95.4 kg. Advance diet Up with therapy Vital signs stable Hemoglobin 7.1, trending down.  Recheck hemoglobin tomorrow.  Transfuse 1 unit of packed red blood cells if hemoglobin less than 7.  Internal medicine may consider transfusion today. Recheck labs in the morning Up with therapy today as tolerated. Plan is for SNF at discharge.  DVT Prophylaxis - Lovenox and TED hose Weight-Bearing as tolerated to right leg  T. Rachelle Hora PA-C [facility] Orthopaedic Surgery 10/27/2021, 6:50 AM

## 2021-10-27 NOTE — Progress Notes (Signed)
PT Cancellation Note  Patient Details Name: Stacy Grimes MRN: [id number] DOB: [birth year]   Cancelled Treatment:     PT attempt. Pt was long sitting in bed with supportive family at bedside. Pt endorses feeling extremely poor. Will be receiving unit of blood shortly. Will hold PT at this time and resume current POC tomorrow.    Willette Pa 10/27/2021, 1:24 PM

## 2021-10-27 NOTE — Progress Notes (Signed)
PROGRESS NOTE    Stacy Grimes  [id number]:[id number] DOB: [birth year] DOA: 10/23/2021 PCP: Cyndi Bender, PA-C    Brief Narrative:  71 y.o. female with medical history significant of nonischemic cardiomyopathy with our UF 20-25%, status post AICD, COPD with chronic hypoxic respite failure on 3-4 L continuously at home, CVA, HTN, AAA, chronic normocytic anemia, presented with mechanical fall and right hip pain.   Appears that patient has a chronic right hip/knee pain, and occasionally she has had episodes of " right leg give up" with near fall and falls.  She had at least 2-3 episode before today.  This morning, patient went to the bathroom, while trying to stand up from toilet bowl, she again felt right leg weakness and fell on the right side and hit her hip.  She denies any prodrome of lightheadedness chest pain dizziness shortness of breath.   She has a chronic O2 dependent on 3 to 4 L, but over the weekend, patient developed a new nonproductive cough and wheezing, family has turned oxygen to 5 L yesterday.  Denies any chest pain no fever chills.  CT pelvis showed right femoral comminuted fracture with intertrochanteric and cervical components.  Taken operating room with orthopedics on 9/19.  Tolerated procedure well however postoperatively became hypotensive requiring initiation of vasopressors.  Was transferred to stepdown unit with PCCM consultation.  Following morning maps are improving.  PCCM remains engaged.  Patient switched to Levophed from Neo-Synephrine.  Remains on 5 L.  9/21: Discussed with PCCM.  Vasopressors weaned off.  PCCM signing off.  Blood pressure stabilized.  9/22: Patient's clinical status starting to improve.  Oxygen weaned to baseline 3 L.  Mental status improved this morning.  Patient endorsing pain in affected extremity.  Assessment & Plan:   Principal Problem:   Hip fracture ([place]) Active Problems:   Hip fracture, unspecified laterality, closed, initial encounter  ([place])   CHF (congestive heart failure) (HCC)   Shock circulatory (HCC)  Postoperative hypotension/shock, resolved Unclear etiology.  Suspect related to hypovolemia due to intraoperative blood loss.  Cardiogenic shock versus iatrogenic related to pain medication and anesthesia remains on differential Weaned off vasopressors 9/21 BP stabilized Plan: Continue low dose metoprolol Resume lasix  Monitor vitals and fever curve  Acute COPD exacerbation Acute on chronic hypoxic respiratory failure -Active wheezing cough, increasing oxygen demand -No clearly infiltrates or consolidation on chest x-ray afebrile, no leukocytosis Respiratory status improved Plan: Continue prednisone 40 mg a day x5 days (3/5) Bronchodilators Wean oxygen as tolerated, currently on 3 L baseline   Chronic HFrEF -Euvolemic -Lasix resumed, 20 mg every other day   Right proximal femoral comminuted fracture -Tolerated procedure well  -Postop hypotension noted -Orthopedic follow-up for wound check -Therapy as tolerated -TOC consult for skilled nursing facility placement   PAF -On sinus rhythm, continue digoxin Monday Wednesday Friday -Beta-blocker resumed   IIDM -Site scale for now   Diabetic neuropathy -Continue gabapentin   Hx of CVA -Continue aspirin, restarted Plavix 9/21   Hypothyroidism, early dementia -Stable  Acute kidney injury Suspect secondary to ATN versus prerenal azotemia IVF discontinued Daily labs Avoid nonessential nephrotoxins   DVT prophylaxis: SQ Lovenox Code Status: Full Family Communication: Sela Hilding [phone] on 9/20, 9/22 Disposition Plan: Status is: Inpatient Remains inpatient appropriate because: Hip fracture postoperative hypotension.  Clinically improving.  Anticipate medical readiness for discharge by Monday 9/25.   Level of care: Med-Surg  Consultants:  PCCM Orthopedics  Procedures:  Hip ORIF  Antimicrobials: None  Subjective: Seen  and examined.  Endorses pain and right leg this morning.  More lucid  Objective: Vitals:   10/27/21 0005 10/27/21 0500 10/27/21 0521 10/27/21 0733  BP: 137/88  (!) 136/53 125/61  Pulse: 91  91 90  Resp: 18  18   Temp: 97.9 F (36.6 C)  97.9 F (36.6 C) 98.5 F (36.9 C)  TempSrc:      SpO2: 100%  97% 96%  Weight:  95.4 kg    Height:        Intake/Output Summary (Last 24 hours) at 10/27/2021 1056 Last data filed at 10/27/2021 0541 Gross per 24 hour  Intake --  Output 450 ml  Net -450 ml   Filed Weights   10/25/21 0259 10/26/21 0500 10/27/21 0500  Weight: 95.8 kg 91.5 kg 95.4 kg    Examination:  General exam: Mild distress due to pain in right leg Respiratory system: Bibasilar crackles.  Normal work of breathing.  3 L Cardiovascular system: S1-S2, distant heart sounds, no murmurs, no pedal edema Gastrointestinal system: Soft, NT/ND, normal bowel sounds Central nervous system: Alert, oriented x3, no focal deficits Extremities: Right hip surgical site, dressing CDI Skin: No rashes, lesions or ulcers Psychiatry: Judgement and insight appear normal. Mood & affect appropriate.     Data Reviewed: I have personally reviewed following labs and imaging studies  CBC: Recent Labs  Lab 10/23/21 1939 10/24/21 0209 10/25/21 0326 10/26/21 0549 10/27/21 0437  WBC 15.7* 12.1* 12.7* 12.6* 10.1  NEUTROABS  --   --  10.7* 10.2* 7.5  HGB 7.3* 9.5* 7.9* 8.0* 7.1*  HCT 26.1* 32.7* 26.7* 27.5* 23.8*  MCV 80.6 83.4 83.7 83.1 81.8  PLT 279 247 190 220 601   Basic Metabolic Panel: Recent Labs  Lab 10/23/21 1939 10/24/21 0209 10/25/21 0326 10/26/21 0549 10/27/21 0437  NA 143 146* 142 142 144  K 3.6 4.1 4.0 4.1 4.5  CL 100 105 104 104 106  CO2 31 33* 30 32 32  GLUCOSE 138* 119* 117* 143* 86  BUN 15 18 19 19 16   CREATININE 1.18* 1.27* 0.96 0.87 0.69  CALCIUM 7.9* 7.7* 7.7* 8.3* 8.3*  MG 1.4* 2.3 2.6* 2.5* 2.3  PHOS 4.2 5.9* 3.2 2.4* 2.8   GFR: Estimated Creatinine  Clearance: 69.8 mL/min (by C-G formula based on SCr of 0.69 mg/dL). Liver Function Tests: Recent Labs  Lab 10/23/21 1939  ALBUMIN 2.5*   No results for input(s): "LIPASE", "AMYLASE" in the last 168 hours. No results for input(s): "AMMONIA" in the last 168 hours. Coagulation Profile: No results for input(s): "INR", "PROTIME" in the last 168 hours. Cardiac Enzymes: No results for input(s): "CKTOTAL", "CKMB", "CKMBINDEX", "TROPONINI" in the last 168 hours. BNP (last 3 results) No results for input(s): "PROBNP" in the last 8760 hours. HbA1C: No results for input(s): "HGBA1C" in the last 72 hours.  CBG: Recent Labs  Lab 10/26/21 0749 10/26/21 1129 10/26/21 1725 10/26/21 2136 10/27/21 0816  GLUCAP 91 139* 198* 133* 103*   Lipid Profile: No results for input(s): "CHOL", "HDL", "LDLCALC", "TRIG", "CHOLHDL", "LDLDIRECT" in the last 72 hours. Thyroid Function Tests: No results for input(s): "TSH", "T4TOTAL", "FREET4", "T3FREE", "THYROIDAB" in the last 72 hours. Anemia Panel: No results for input(s): "VITAMINB12", "FOLATE", "FERRITIN", "TIBC", "IRON", "RETICCTPCT" in the last 72 hours.  Sepsis Labs: Recent Labs  Lab 10/24/21 0209 10/24/21 0428 10/24/21 0828 10/24/21 1106 10/24/21 1320 10/25/21 0326 10/26/21 0549  PROCALCITON 0.63  --   --   --   --  0.52 0.70  LATICACIDVEN  --  2.5* 2.7* 3.3* 3.3*  --   --     Recent Results (from the past 240 hour(s))  MRSA Next Gen by PCR, Nasal     Status: None   Collection Time: 10/23/21  7:01 PM   Specimen: Nasal Mucosa; Nasal Swab  Result Value Ref Range Status   MRSA by PCR Next Gen NOT DETECTED NOT DETECTED Final    Comment: (NOTE) The GeneXpert MRSA Assay (FDA approved for NASAL specimens only), is one component of a comprehensive MRSA colonization surveillance program. It is not intended to diagnose MRSA infection nor to guide or monitor treatment for MRSA infections. Test performance is not FDA approved in patients less  than 63 years old. Performed at [facility], [address] [place], [place] [address]          Radiology Studies: No results found.      Scheduled Meds:  sodium chloride   Intravenous Once   allopurinol  100 mg Oral BID   atorvastatin  80 mg Oral QPM   Chlorhexidine Gluconate Cloth  6 each Topical Q0600   clopidogrel  75 mg Oral Daily   digoxin  0.125 mg Oral Once per day on Mon Wed Fri   docusate sodium  100 mg Oral BID   donepezil  10 mg Oral QHS   enoxaparin (LOVENOX) injection  0.5 mg/kg Subcutaneous Q24H   feeding supplement  237 mL Oral BID BM   gabapentin  400 mg Oral TID   guaiFENesin  1,200 mg Oral BID   insulin aspart  0-15 Units Subcutaneous TID WC   levothyroxine  100 mcg Oral Q0600   memantine  10 mg Oral BID   methocarbamol  500 mg Oral TID   metoprolol tartrate  12.5 mg Oral BID   mirtazapine  7.5 mg Oral BID   mometasone-formoterol  2 puff Inhalation BID   multivitamin with minerals  1 tablet Oral Daily   pantoprazole  40 mg Oral Daily   predniSONE  40 mg Oral Q breakfast   tiotropium  18 mcg Inhalation Daily   venlafaxine XR  75 mg Oral BID   Continuous Infusions:  sodium chloride Stopped (10/23/21 1958)   sodium chloride Stopped (10/25/21 0830)     LOS: 4 days     Sidney Ace, MD [facility]   If 7PM-7AM, please contact night-coverage  10/27/2021, 10:56 AM

## 2021-10-28 DIAGNOSIS — S72001D Fracture of unspecified part of neck of right femur, subsequent encounter for closed fracture with routine healing: Secondary | ICD-10-CM | POA: Diagnosis not present

## 2021-10-28 DIAGNOSIS — R579 Shock, unspecified: Secondary | ICD-10-CM | POA: Diagnosis not present

## 2021-10-28 LAB — BPAM RBC
Blood Product Expiration Date: 202310312359
ISSUE DATE / TIME: 202309231331
Unit Type and Rh: 5100

## 2021-10-28 LAB — TYPE AND SCREEN
ABO/RH(D): O POS
Antibody Screen: NEGATIVE
Unit division: 0

## 2021-10-28 LAB — CBC WITH DIFFERENTIAL/PLATELET
Abs Immature Granulocytes: 0.07 10*3/uL (ref 0.00–0.07)
Basophils Absolute: 0 10*3/uL (ref 0.0–0.1)
Basophils Relative: 0 %
Eosinophils Absolute: 0.1 10*3/uL (ref 0.0–0.5)
Eosinophils Relative: 1 %
HCT: 26.8 % — ABNORMAL LOW (ref 36.0–46.0)
Hemoglobin: 8.4 g/dL — ABNORMAL LOW (ref 12.0–15.0)
Immature Granulocytes: 1 %
Lymphocytes Relative: 19 %
Lymphs Abs: 2.2 10*3/uL (ref 0.7–4.0)
MCH: 25.5 pg — ABNORMAL LOW (ref 26.0–34.0)
MCHC: 31.3 g/dL (ref 30.0–36.0)
MCV: 81.2 fL (ref 80.0–100.0)
Monocytes Absolute: 1.2 10*3/uL — ABNORMAL HIGH (ref 0.1–1.0)
Monocytes Relative: 10 %
Neutro Abs: 7.8 10*3/uL — ABNORMAL HIGH (ref 1.7–7.7)
Neutrophils Relative %: 69 %
Platelets: 245 10*3/uL (ref 150–400)
RBC: 3.3 MIL/uL — ABNORMAL LOW (ref 3.87–5.11)
RDW: 19.8 % — ABNORMAL HIGH (ref 11.5–15.5)
WBC: 11.3 10*3/uL — ABNORMAL HIGH (ref 4.0–10.5)
nRBC: 1 % — ABNORMAL HIGH (ref 0.0–0.2)

## 2021-10-28 LAB — GLUCOSE, CAPILLARY
Glucose-Capillary: 90 mg/dL (ref 70–99)
Glucose-Capillary: 192 mg/dL — ABNORMAL HIGH (ref 70–99)
Glucose-Capillary: 201 mg/dL — ABNORMAL HIGH (ref 70–99)
Glucose-Capillary: 147 mg/dL — ABNORMAL HIGH (ref 70–99)

## 2021-10-28 MED ORDER — ENOXAPARIN SODIUM 40 MG/0.4ML IJ SOSY
40.0000 mg | PREFILLED_SYRINGE | INTRAMUSCULAR | Status: DC
  Administered 2021-10-29 – 2021-10-30 (×2): 40 mg via SUBCUTANEOUS
  Filled 2021-10-28 (×2): qty 0.4

## 2021-10-28 MED ORDER — METHOCARBAMOL 500 MG PO TABS
750.0000 mg | ORAL_TABLET | Freq: Three times a day (TID) | ORAL | Status: DC
  Administered 2021-10-28 – 2021-10-30 (×7): 750 mg via ORAL
  Filled 2021-10-28 (×7): qty 2

## 2021-10-28 NOTE — Progress Notes (Signed)
PROGRESS NOTE    Stacy Grimes  GBT:[id number] DOB: [birth year] DOA: 10/23/2021 PCP: Cyndi Bender, PA-C    Brief Narrative:  71 y.o. female with medical history significant of nonischemic cardiomyopathy with our UF 20-25%, status post AICD, COPD with chronic hypoxic respite failure on 3-4 L continuously at home, CVA, HTN, AAA, chronic normocytic anemia, presented with mechanical fall and right hip pain.   Appears that patient has a chronic right hip/knee pain, and occasionally she has had episodes of " right leg give up" with near fall and falls.  She had at least 2-3 episode before today.  This morning, patient went to the bathroom, while trying to stand up from toilet bowl, she again felt right leg weakness and fell on the right side and hit her hip.  She denies any prodrome of lightheadedness chest pain dizziness shortness of breath.   She has a chronic O2 dependent on 3 to 4 L, but over the weekend, patient developed a new nonproductive cough and wheezing, family has turned oxygen to 5 L yesterday.  Denies any chest pain no fever chills.  CT pelvis showed right femoral comminuted fracture with intertrochanteric and cervical components.  Taken operating room with orthopedics on 9/19.  Tolerated procedure well however postoperatively became hypotensive requiring initiation of vasopressors.  Was transferred to stepdown unit with PCCM consultation.  Following morning maps are improving.  PCCM remains engaged.  Patient switched to Levophed from Neo-Synephrine.  Remains on 5 L.  9/21: Discussed with PCCM.  Vasopressors weaned off.  PCCM signing off.  Blood pressure stabilized.  9/22: Patient's clinical status starting to improve.  Oxygen weaned to baseline 3 L.  Mental status improved this morning.  Patient endorsing pain in affected extremity.  Assessment & Plan:   Principal Problem:   Hip fracture ([place]) Active Problems:   Hip fracture, unspecified laterality, closed, initial encounter  ([place])   CHF (congestive heart failure) (HCC)   Shock circulatory (HCC)  Postoperative hypotension/shock, resolved Unclear etiology.  Suspect related to hypovolemia due to intraoperative blood loss.  Cardiogenic shock versus iatrogenic related to pain medication and anesthesia remains on differential Weaned off vasopressors 9/21 BP stabilized Plan: Continue low dose metoprolol Continue Lasix per home dose Monitor vitals and fever curve  Acute COPD exacerbation Acute on chronic hypoxic respiratory failure -Active wheezing cough, increasing oxygen demand -No clearly infiltrates or consolidation on chest x-ray afebrile, no leukocytosis Respiratory status improved No wheezing noted Plan: Continue prednisone 40 mg a day x5 days (4/5) Bronchodilators Wean oxygen as tolerated, currently on 3 L baseline   Chronic HFrEF -Euvolemic -Lasix resumed, 20 mg every other day   Right proximal femoral comminuted fracture -Tolerated procedure well  -Postop hypotension noted -Orthopedic follow-up for wound check -Therapy as tolerated -TOC consult for skilled nursing facility placement   PAF -On sinus rhythm, continue digoxin Monday Wednesday Friday -Beta-blocker resumed   IIDM -Site scale for now   Diabetic neuropathy -Continue gabapentin   Hx of CVA -Continue aspirin, restarted Plavix 9/21   Hypothyroidism, early dementia -Stable  Acute kidney injury Suspect secondary to ATN versus prerenal azotemia IVF discontinued Daily labs Avoid nonessential nephrotoxins   DVT prophylaxis: SQ Lovenox Code Status: Full Family Communication: Sela Hilding [phone] on 9/20, 9/22 Disposition Plan: Status is: Inpatient Remains inpatient appropriate because: Hip fracture postoperative hypotension.  Clinically improving.  Anticipate medical readiness for discharge by Monday 9/25.   Level of care: Med-Surg  Consultants:  PCCM Orthopedics  Procedures:  Hip  ORIF  Antimicrobials: None   Subjective: Seen and examined.  Endorses pain and right leg this morning.  More lucid  Objective: Vitals:   10/27/21 1715 10/27/21 2153 10/28/21 0500 10/28/21 0827  BP: 120/69 (!) 125/57  116/63  Pulse: (!) 105 96  99  Resp: 17 18  17   Temp: 98.1 F (36.7 C) 98.3 F (36.8 C)  98.2 F (36.8 C)  TempSrc: Oral     SpO2: 93% 90%  92%  Weight:   91.8 kg   Height:        Intake/Output Summary (Last 24 hours) at 10/28/2021 1333 Last data filed at 10/28/2021 0936 Gross per 24 hour  Intake 842 ml  Output 400 ml  Net 442 ml   Filed Weights   10/26/21 0500 10/27/21 0500 10/28/21 0500  Weight: 91.5 kg 95.4 kg 91.8 kg    Examination:  General exam: NAD Respiratory system: Diminished breath sounds, otherwise clear.  Normal work of breathing.  3 L Cardiovascular system: S1-S2, distant heart sounds, no murmurs, no pedal edema Gastrointestinal system: Soft, NT/ND, normal bowel sounds Central nervous system: Alert, oriented x3, no focal deficits Extremities: Right hip surgical site, dressing CDI Skin: No rashes, lesions or ulcers Psychiatry: Judgement and insight appear normal. Mood & affect appropriate.     Data Reviewed: I have personally reviewed following labs and imaging studies  CBC: Recent Labs  Lab 10/24/21 0209 10/25/21 0326 10/26/21 0549 10/27/21 0437 10/27/21 1836 10/28/21 0505  WBC 12.1* 12.7* 12.6* 10.1  --  11.3*  NEUTROABS  --  10.7* 10.2* 7.5  --  7.8*  HGB 9.5* 7.9* 8.0* 7.1* 8.4* 8.4*  HCT 32.7* 26.7* 27.5* 23.8* 27.3* 26.8*  MCV 83.4 83.7 83.1 81.8  --  81.2  PLT 247 190 220 238  --  010   Basic Metabolic Panel: Recent Labs  Lab 10/23/21 1939 10/24/21 0209 10/25/21 0326 10/26/21 0549 10/27/21 0437  NA 143 146* 142 142 144  K 3.6 4.1 4.0 4.1 4.5  CL 100 105 104 104 106  CO2 31 33* 30 32 32  GLUCOSE 138* 119* 117* 143* 86  BUN 15 18 19 19 16   CREATININE 1.18* 1.27* 0.96 0.87 0.69  CALCIUM 7.9* 7.7* 7.7* 8.3*  8.3*  MG 1.4* 2.3 2.6* 2.5* 2.3  PHOS 4.2 5.9* 3.2 2.4* 2.8   GFR: Estimated Creatinine Clearance: 68.4 mL/min (by C-G formula based on SCr of 0.69 mg/dL). Liver Function Tests: Recent Labs  Lab 10/23/21 1939  ALBUMIN 2.5*   No results for input(s): "LIPASE", "AMYLASE" in the last 168 hours. No results for input(s): "AMMONIA" in the last 168 hours. Coagulation Profile: No results for input(s): "INR", "PROTIME" in the last 168 hours. Cardiac Enzymes: No results for input(s): "CKTOTAL", "CKMB", "CKMBINDEX", "TROPONINI" in the last 168 hours. BNP (last 3 results) No results for input(s): "PROBNP" in the last 8760 hours. HbA1C: No results for input(s): "HGBA1C" in the last 72 hours.  CBG: Recent Labs  Lab 10/27/21 1204 10/27/21 1745 10/27/21 2242 10/28/21 0829 10/28/21 1251  GLUCAP 144* 194* 133* 90 192*   Lipid Profile: No results for input(s): "CHOL", "HDL", "LDLCALC", "TRIG", "CHOLHDL", "LDLDIRECT" in the last 72 hours. Thyroid Function Tests: No results for input(s): "TSH", "T4TOTAL", "FREET4", "T3FREE", "THYROIDAB" in the last 72 hours. Anemia Panel: No results for input(s): "VITAMINB12", "FOLATE", "FERRITIN", "TIBC", "IRON", "RETICCTPCT" in the last 72 hours.  Sepsis Labs: Recent Labs  Lab 10/24/21 0209 10/24/21 0428 10/24/21 0828 10/24/21 1106 10/24/21 1320 10/25/21  3710 10/26/21 0549  PROCALCITON 0.63  --   --   --   --  0.52 0.70  LATICACIDVEN  --  2.5* 2.7* 3.3* 3.3*  --   --     Recent Results (from the past 240 hour(s))  MRSA Next Gen by PCR, Nasal     Status: None   Collection Time: 10/23/21  7:01 PM   Specimen: Nasal Mucosa; Nasal Swab  Result Value Ref Range Status   MRSA by PCR Next Gen NOT DETECTED NOT DETECTED Final    Comment: (NOTE) The GeneXpert MRSA Assay (FDA approved for NASAL specimens only), is one component of a comprehensive MRSA colonization surveillance program. It is not intended to diagnose MRSA infection nor to guide or  monitor treatment for MRSA infections. Test performance is not FDA approved in patients less than 11 years old. Performed at [facility], [address] [place], [place] [address]          Radiology Studies: No results found.      Scheduled Meds:  allopurinol  100 mg Oral BID   atorvastatin  80 mg Oral QPM   Chlorhexidine Gluconate Cloth  6 each Topical Q0600   clopidogrel  75 mg Oral Daily   digoxin  0.125 mg Oral Once per day on Mon Wed Fri   docusate sodium  100 mg Oral BID   donepezil  10 mg Oral QHS   [START ON 10/29/2021] enoxaparin (LOVENOX) injection  40 mg Subcutaneous Q24H   feeding supplement  237 mL Oral BID BM   furosemide  20 mg Oral QODAY   gabapentin  400 mg Oral TID   guaiFENesin  1,200 mg Oral BID   insulin aspart  0-15 Units Subcutaneous TID WC   levothyroxine  100 mcg Oral Q0600   memantine  10 mg Oral BID   methocarbamol  750 mg Oral TID   metoprolol tartrate  12.5 mg Oral BID   mirtazapine  7.5 mg Oral BID   mometasone-formoterol  2 puff Inhalation BID   multivitamin with minerals  1 tablet Oral Daily   pantoprazole  40 mg Oral Daily   tiotropium  18 mcg Inhalation Daily   venlafaxine XR  75 mg Oral BID   Continuous Infusions:  sodium chloride Stopped (10/23/21 1958)   sodium chloride Stopped (10/25/21 0830)     LOS: 5 days     Sidney Ace, MD [facility]   If 7PM-7AM, please contact night-coverage  10/28/2021, 1:33 PM

## 2021-10-28 NOTE — TOC Progression Note (Addendum)
Transition of Care [facility]) - Progression Note    Patient Details  Name: Stacy Grimes MRN: [id number] Date of Birth: 1950-04-18  [facility] CM/SW Contact  Izola Price, RN Phone Number: 10/28/2021, 10:03 AM  Clinical Narrative:  9/24: Left VM with spouse Stacy Grimes, at [phone] with  all back # to discuss SNF choices that have accepted so far. Simmie Davies RN CM 1000 am.   Also tried home phone with no answer and no option for VM. Not in room. Will keep trying to review SNF options presented. Simmie Davies RN CM   Spouse returned RN CM call and wants to wait to see what beds in [place] area, which are still pending, accept. [place]     Expected Discharge Plan: Skilled Nursing Facility Barriers to Discharge: Continued Medical Work up  Expected Discharge Plan and Services Expected Discharge Plan: [place] arrangements for the past 2 months: Single Family Home                                       Social Determinants of Health (SDOH) Interventions    Readmission Risk Interventions     No data to display

## 2021-10-28 NOTE — Progress Notes (Signed)
Subjective: 5 Days Post-Op Procedure(s) (LRB): INTRAMEDULLARY (IM) NAIL INTERTROCHANTERIC (Right) Patient's pain is moderate this morning.   No new complaints. Received 1 unit of PRBC 10/27/2021 PT and care management to assist with discharge planning following surgery.  Patient will likely need SNF upon discharge.  Objective: Vital signs in last 24 hours: Temp:  [98.1 F (36.7 C)-98.7 F (37.1 C)] 98.2 F (36.8 C) (09/24 0827) Pulse Rate:  [95-105] 99 (09/24 0827) Resp:  [16-18] 17 (09/24 0827) BP: (104-125)/(57-69) 116/63 (09/24 0827) SpO2:  [90 %-94 %] 92 % (09/24 0827) Weight:  [91.8 kg] 91.8 kg (09/24 0500)  Intake/Output from previous day:  Intake/Output Summary (Last 24 hours) at 10/28/2021 0926 Last data filed at 10/27/2021 1715 Gross per 24 hour  Intake 842 ml  Output --  Net 842 ml    Intake/Output this shift: No intake/output data recorded.  Labs: Recent Labs    10/26/21 0549 10/27/21 0437 10/27/21 1836 10/28/21 0505  HGB 8.0* 7.1* 8.4* 8.4*   Recent Labs    10/27/21 0437 10/27/21 1836 10/28/21 0505  WBC 10.1  --  11.3*  RBC 2.91*  --  3.30*  HCT 23.8* 27.3* 26.8*  PLT 238  --  245   Recent Labs    10/26/21 0549 10/27/21 0437  NA 142 144  K 4.1 4.5  CL 104 106  CO2 32 32  BUN 19 16  CREATININE 0.87 0.69  GLUCOSE 143* 86  CALCIUM 8.3* 8.3*   No results for input(s): "LABPT", "INR" in the last 72 hours.   EXAM General - Patient is Alert, Appropriate, and Oriented Extremity - ABD soft Sensation intact distally Dorsiflexion/Plantar flexion intact Incision: dressing C/D/I No cellulitis present Compartment soft Dressing/Incision - honeycomb dressings intact without drainage to the right leg this morning. Motor Function - intact, moving foot and toes well on exam.    Past Medical History:  Diagnosis Date   AAA (abdominal aortic aneurysm) (HCC)    Anginal pain ([place])    Anxiety    Arthritis    Automatic implantable  cardioverter-defibrillator in situ    Biventricular ICD  [place]    COPD (chronic obstructive pulmonary disease) (HCC)    Deafness    left ear   Dementia (HCC)    Depression    Dizziness    Dysrhythmia    Fracture    history of spinal fracture   GERD (gastroesophageal reflux disease)    HFrEF (heart failure with reduced ejection fraction) ([place])    a. 2010 Cath: nonobs dzs, EF 15-20%; b. 09/2017 Echo: EF suboptimal. EF low nl; c. 04/2018 Echo: EF 40-45%.   Hypertension    Hypothyroidism    Non-ischemic cardiomyopathy ([place])    a. 2010 Cath: nonobs dzs, EF 15-20%; b. 2011 s/p SJM CRT-D; c. 09/2016 s/p Gen change; d. 09/2017 Echo: EF suboptimal. EF low nl; e. 04/2018 Echo: EF 40-45%.   Oxygen dependent    Palpitations    Shortness of breath dyspnea    Sleep apnea    Wheezing     Assessment/Plan: 5 Days Post-Op Procedure(s) (LRB): INTRAMEDULLARY (IM) NAIL INTERTROCHANTERIC (Right) Principal Problem:   Hip fracture (HCC) Active Problems:   Hip fracture, unspecified laterality, closed, initial encounter ([place])   CHF (congestive heart failure) (HCC)   Shock circulatory (HCC)  Estimated body mass index is 35.85 kg/m as calculated from the following:   Height as of this encounter: 5\' 3"  (1.6 m).   Weight as of this encounter: 91.8 kg.  Advance diet Up with therapy Vital signs stable Hemoglobin 8.4, s/p 1 unit PRBC 10/27/2021 Recheck labs in the morning Up with therapy today as tolerated. Plan is for SNF at discharge.  DVT Prophylaxis - Lovenox and TED hose Weight-Bearing as tolerated to right leg  T. Rachelle Hora PA-C [facility] Orthopaedic Surgery 10/28/2021, 9:26 AM

## 2021-10-28 NOTE — Progress Notes (Signed)
Physical Therapy Treatment Patient Details Name: Stacy Grimes MRN: [id number] DOB: 08-12-1950 Today's Date: 10/28/2021   History of Present Illness Pt is a 71 yo female s/p hip IM nailing due to a fall. Transferred to ICU for hypotension. PMH of HTN, CHF, COPD, cardiac defibrillator, DM, AAA, dementia, spinal fx, O2 use, CVA, L ear deafness.    PT Comments    Pt with c/o pain upon entering room.  She get to EOB with increased time and rail but without physical assistance today.  She denies dizziness upon sitting and throughout session.  Remains sitting EOB x 15 minutes today.  LE AROM.  Stood x 1 with mod a x 1 and unable to fully clear hips off bed.  Pad noted to be wet.  Tech arrived with new pad and assisted with standing to replace and returning to supine.  Unable to stand fully to get safely to chair today.  Pt fatigued with activity.  SOB noted.  Sats 85% and HR 130 after standing today but no dizziness.  RN notified.   Recommendations for follow up therapy are one component of a multi-disciplinary discharge planning process, led by the attending physician.  Recommendations may be updated based on patient status, additional functional criteria and insurance authorization.  Follow Up Recommendations  Skilled nursing-short term rehab (<3 hours/day)     Assistance Recommended at Discharge Frequent or constant Supervision/Assistance  Patient can return home with the following Two people to help with walking and/or transfers;Two people to help with bathing/dressing/bathroom;Help with stairs or ramp for entrance;Assistance with feeding;Assist for transportation;Direct supervision/assist for financial management;Assistance with cooking/housework   Equipment Recommendations       Recommendations for Other Services       Precautions / Restrictions Precautions Precautions: Fall Restrictions Weight Bearing Restrictions: Yes RLE Weight Bearing: Weight bearing as tolerated      Mobility  Bed Mobility Overal bed mobility: Needs Assistance Bed Mobility: Supine to Sit, Sit to Supine     Supine to sit: Mod assist, Min assist Sit to supine: Max assist, +2 for physical assistance   General bed mobility comments: is able to scoot to EOB on her own today with time and encouragement    Transfers Overall transfer level: Needs assistance Equipment used: Rolling walker (2 wheels) Transfers: Sit to/from Stand Sit to Stand: Mod assist, +2 physical assistance           General transfer comment: unable to stand fully upright or fully clear hips off bed despite +2    Ambulation/Gait               General Gait Details: uanble   Stairs             Wheelchair Mobility    Modified Rankin (Stroke Patients Only)       Balance Overall balance assessment: Needs assistance Sitting-balance support: Feet supported, Bilateral upper extremity supported Sitting balance-Leahy Scale: Fair Sitting balance - Comments: supervision static sitting   Standing balance support: Bilateral upper extremity supported, Reliant on assistive device for balance Standing balance-Leahy Scale: Zero                              Cognition Arousal/Alertness: Awake/alert Behavior During Therapy: WFL for tasks assessed/performed Overall Cognitive Status: Within Functional Limits for tasks assessed  General Comments: does well hearing today with session.  seems more cognitively intact today        Exercises Other Exercises Other Exercises: seated AROM BLE    General Comments        Pertinent Vitals/Pain Pain Assessment Pain Assessment: Faces Faces Pain Scale: Hurts even more Pain Location: R hip with movement Pain Descriptors / Indicators: Discomfort, Grimacing, Guarding Pain Intervention(s): Limited activity within patient's tolerance, Monitored during session, Repositioned    Home Living                           Prior Function            PT Goals (current goals can now be found in the care plan section) Progress towards PT goals: Progressing toward goals    Frequency    7X/week      PT Plan Current plan remains appropriate    Co-evaluation              AM-PAC PT "6 Clicks" Mobility   Outcome Measure  Help needed turning from your back to your side while in a flat bed without using bedrails?: A Little Help needed moving from lying on your back to sitting on the side of a flat bed without using bedrails?: A Little Help needed moving to and from a bed to a chair (including a wheelchair)?: Total Help needed standing up from a chair using your arms (e.g., wheelchair or bedside chair)?: Total Help needed to walk in hospital room?: Total Help needed climbing 3-5 steps with a railing? : Total 6 Click Score: 10    End of Session Equipment Utilized During Treatment: Oxygen;Gait belt Activity Tolerance: Patient limited by fatigue Patient left: in bed;with call bell/phone within reach;with bed alarm set Nurse Communication: Mobility status;Other (comment) PT Visit Diagnosis: Other abnormalities of gait and mobility (R26.89);Difficulty in walking, not elsewhere classified (R26.2);Muscle weakness (generalized) (M62.81);Pain Pain - Right/Left: Right Pain - part of body: Hip     Time: 8841-6606 PT Time Calculation (min) (ACUTE ONLY): 19 min  Charges:  $Therapeutic Activity: 8-22 mins                   Chesley Noon, PTA 10/28/21, 10:02 AM

## 2021-10-29 DIAGNOSIS — S72001D Fracture of unspecified part of neck of right femur, subsequent encounter for closed fracture with routine healing: Secondary | ICD-10-CM | POA: Diagnosis not present

## 2021-10-29 LAB — CBC WITH DIFFERENTIAL/PLATELET
Abs Immature Granulocytes: 0.16 10*3/uL — ABNORMAL HIGH (ref 0.00–0.07)
Basophils Absolute: 0 10*3/uL (ref 0.0–0.1)
Basophils Relative: 0 %
Eosinophils Absolute: 0.1 10*3/uL (ref 0.0–0.5)
Eosinophils Relative: 1 %
HCT: 28 % — ABNORMAL LOW (ref 36.0–46.0)
Hemoglobin: 8.6 g/dL — ABNORMAL LOW (ref 12.0–15.0)
Immature Granulocytes: 1 %
Lymphocytes Relative: 19 %
Lymphs Abs: 2.3 10*3/uL (ref 0.7–4.0)
MCH: 25.4 pg — ABNORMAL LOW (ref 26.0–34.0)
MCHC: 30.7 g/dL (ref 30.0–36.0)
MCV: 82.6 fL (ref 80.0–100.0)
Monocytes Absolute: 1.2 10*3/uL — ABNORMAL HIGH (ref 0.1–1.0)
Monocytes Relative: 10 %
Neutro Abs: 8.5 10*3/uL — ABNORMAL HIGH (ref 1.7–7.7)
Neutrophils Relative %: 69 %
Platelets: 267 10*3/uL (ref 150–400)
RBC: 3.39 MIL/uL — ABNORMAL LOW (ref 3.87–5.11)
RDW: 20.7 % — ABNORMAL HIGH (ref 11.5–15.5)
WBC: 12.3 10*3/uL — ABNORMAL HIGH (ref 4.0–10.5)
nRBC: 1.4 % — ABNORMAL HIGH (ref 0.0–0.2)

## 2021-10-29 LAB — GLUCOSE, CAPILLARY
Glucose-Capillary: 95 mg/dL (ref 70–99)
Glucose-Capillary: 131 mg/dL — ABNORMAL HIGH (ref 70–99)
Glucose-Capillary: 135 mg/dL — ABNORMAL HIGH (ref 70–99)
Glucose-Capillary: 131 mg/dL — ABNORMAL HIGH (ref 70–99)

## 2021-10-29 MED ORDER — HYDROCODONE-ACETAMINOPHEN 7.5-325 MG PO TABS: 1.0000 | ORAL_TABLET | ORAL | 0 refills | Status: DC | PRN

## 2021-10-29 MED ORDER — ENOXAPARIN SODIUM 40 MG/0.4ML IJ SOSY: 40.0000 mg | PREFILLED_SYRINGE | INTRAMUSCULAR | 0 refills | Status: DC

## 2021-10-29 MED ORDER — HYDROCODONE-ACETAMINOPHEN 10-325 MG PO TABS
1.0000 | ORAL_TABLET | ORAL | Status: DC | PRN
  Administered 2021-10-29 – 2021-10-30 (×3): 2 via ORAL
  Administered 2021-10-30: 1 via ORAL
  Filled 2021-10-29: qty 1
  Filled 2021-10-29 (×3): qty 2

## 2021-10-29 NOTE — Progress Notes (Signed)
PROGRESS NOTE    Stacy Grimes  RSW:[id number] DOB: Mar 06, 1950 DOA: 10/23/2021 PCP: Cyndi Bender, PA-C    Brief Narrative:  71 y.o. female with medical history significant of nonischemic cardiomyopathy with our UF 20-25%, status post AICD, COPD with chronic hypoxic respite failure on 3-4 L continuously at home, CVA, HTN, AAA, chronic normocytic anemia, presented with mechanical fall and right hip pain.   Appears that patient has a chronic right hip/knee pain, and occasionally she has had episodes of " right leg give up" with near fall and falls.  She had at least 2-3 episode before today.  This morning, patient went to the bathroom, while trying to stand up from toilet bowl, she again felt right leg weakness and fell on the right side and hit her hip.  She denies any prodrome of lightheadedness chest pain dizziness shortness of breath.   She has a chronic O2 dependent on 3 to 4 L, but over the weekend, patient developed a new nonproductive cough and wheezing, family has turned oxygen to 5 L yesterday.  Denies any chest pain no fever chills.  CT pelvis showed right femoral comminuted fracture with intertrochanteric and cervical components.  Taken operating room with orthopedics on 9/19.  Tolerated procedure well however postoperatively became hypotensive requiring initiation of vasopressors.  Was transferred to stepdown unit with PCCM consultation.  Following morning maps are improving.  PCCM remains engaged.  Patient switched to Levophed from Neo-Synephrine.  Remains on 5 L.  9/21: Discussed with PCCM.  Vasopressors weaned off.  PCCM signing off.  Blood pressure stabilized.  9/22: Patient's clinical status starting to improve.  Oxygen weaned to baseline 3 L.  Mental status improved this morning.  Patient endorsing pain in affected extremity.  Assessment & Plan:   Principal Problem:   Hip fracture ([place]) Active Problems:   Hip fracture, unspecified laterality, closed, initial encounter  ([place])   CHF (congestive heart failure) (HCC)   Shock circulatory (HCC)  Postoperative hypotension/shock, resolved Unclear etiology.  Suspect related to hypovolemia due to intraoperative blood loss.  Cardiogenic shock versus iatrogenic related to pain medication and anesthesia remains on differential Weaned off vasopressors 9/21 BP stabilized Plan: Continue low dose metoprolol Continue Lasix per home dose Monitor vitals and fever curve  Acute COPD exacerbation Acute on chronic hypoxic respiratory failure -Active wheezing cough, increasing oxygen demand -No clearly infiltrates or consolidation on chest x-ray afebrile, no leukocytosis Respiratory status improved No wheezing noted Plan: Continue prednisone 40 mg a day x5 days (5/5) Bronchodilators Wean oxygen as tolerated, currently on 3 L baseline   Chronic HFrEF -Euvolemic -Lasix resumed, 20 mg every other day   Right proximal femoral comminuted fracture -Tolerated procedure well  -Postop hypotension noted -Orthopedic follow-up for wound check -Therapy as tolerated -TOC consult for skilled nursing facility placement   PAF -On sinus rhythm, continue digoxin Monday Wednesday Friday -Beta-blocker resumed   IIDM -Site scale for now   Diabetic neuropathy -Continue gabapentin   Hx of CVA -Continue aspirin, restarted Plavix 9/21   Hypothyroidism, early dementia -Stable  Acute kidney injury Suspect secondary to ATN versus prerenal azotemia IVF discontinued Daily labs Avoid nonessential nephrotoxins   DVT prophylaxis: SQ Lovenox Code Status: Full Family Communication: Sela Hilding [phone] on 9/20, 9/22, at bedside 9/25 Disposition Plan: Status is: Inpatient Remains inpatient appropriate because: Hip fracture postoperative hypotension.  Clinically improving.  Patient is medically ready for discharge.  We have insurance authorization and approval for skilled nursing facility.  Anticipated date  of  discharge 9/26.   Level of care: Med-Surg  Consultants:  PCCM Orthopedics  Procedures:  Hip ORIF  Antimicrobials: None   Subjective: Seen and examined.  Endorses pain and right leg this morning.  More lucid  Objective: Vitals:   10/28/21 1654 10/28/21 1747 10/28/21 2131 10/29/21 0739  BP: 129/77 123/71 139/76 132/72  Pulse: 100 96 90 87  Resp: 16 15 20    Temp: 98.7 F (37.1 C) 98.1 F (36.7 C) (!) 97.5 F (36.4 C) 98.4 F (36.9 C)  TempSrc:  Oral    SpO2: 95% 94% 100% 95%  Weight:      Height:        Intake/Output Summary (Last 24 hours) at 10/29/2021 1149 Last data filed at 10/29/2021 1000 Gross per 24 hour  Intake 480 ml  Output 750 ml  Net -270 ml   Filed Weights   10/26/21 0500 10/27/21 0500 10/28/21 0500  Weight: 91.5 kg 95.4 kg 91.8 kg    Examination:  General exam: Mild distress due to pain Respiratory system: Decreased breath sounds bilaterally.  Normal work of breathing.  3 L Cardiovascular system: S1-S2, distant heart sounds, no murmurs, no pedal edema Gastrointestinal system: Soft, NT/ND, normal bowel sounds Central nervous system: Alert, oriented x3, no focal deficits Extremities: Right hip surgical site, dressing CDI Skin: No rashes, lesions or ulcers Psychiatry: Judgement and insight appear normal. Mood & affect appropriate.     Data Reviewed: I have personally reviewed following labs and imaging studies  CBC: Recent Labs  Lab 10/25/21 0326 10/26/21 0549 10/27/21 0437 10/27/21 1836 10/28/21 0505 10/29/21 0456  WBC 12.7* 12.6* 10.1  --  11.3* 12.3*  NEUTROABS 10.7* 10.2* 7.5  --  7.8* 8.5*  HGB 7.9* 8.0* 7.1* 8.4* 8.4* 8.6*  HCT 26.7* 27.5* 23.8* 27.3* 26.8* 28.0*  MCV 83.7 83.1 81.8  --  81.2 82.6  PLT 190 220 238  --  245 073   Basic Metabolic Panel: Recent Labs  Lab 10/23/21 1939 10/24/21 0209 10/25/21 0326 10/26/21 0549 10/27/21 0437  NA 143 146* 142 142 144  K 3.6 4.1 4.0 4.1 4.5  CL 100 105 104 104 106  CO2 31  33* 30 32 32  GLUCOSE 138* 119* 117* 143* 86  BUN 15 18 19 19 16   CREATININE 1.18* 1.27* 0.96 0.87 0.69  CALCIUM 7.9* 7.7* 7.7* 8.3* 8.3*  MG 1.4* 2.3 2.6* 2.5* 2.3  PHOS 4.2 5.9* 3.2 2.4* 2.8   GFR: Estimated Creatinine Clearance: 68.4 mL/min (by C-G formula based on SCr of 0.69 mg/dL). Liver Function Tests: Recent Labs  Lab 10/23/21 1939  ALBUMIN 2.5*   No results for input(s): "LIPASE", "AMYLASE" in the last 168 hours. No results for input(s): "AMMONIA" in the last 168 hours. Coagulation Profile: No results for input(s): "INR", "PROTIME" in the last 168 hours. Cardiac Enzymes: No results for input(s): "CKTOTAL", "CKMB", "CKMBINDEX", "TROPONINI" in the last 168 hours. BNP (last 3 results) No results for input(s): "PROBNP" in the last 8760 hours. HbA1C: No results for input(s): "HGBA1C" in the last 72 hours.  CBG: Recent Labs  Lab 10/28/21 0829 10/28/21 1251 10/28/21 1657 10/28/21 2000 10/29/21 0805  GLUCAP 90 192* 201* 147* 95   Lipid Profile: No results for input(s): "CHOL", "HDL", "LDLCALC", "TRIG", "CHOLHDL", "LDLDIRECT" in the last 72 hours. Thyroid Function Tests: No results for input(s): "TSH", "T4TOTAL", "FREET4", "T3FREE", "THYROIDAB" in the last 72 hours. Anemia Panel: No results for input(s): "VITAMINB12", "FOLATE", "FERRITIN", "TIBC", "IRON", "RETICCTPCT" in the  last 72 hours.  Sepsis Labs: Recent Labs  Lab 10/24/21 0209 10/24/21 0428 10/24/21 0828 10/24/21 1106 10/24/21 1320 10/25/21 0326 10/26/21 0549  PROCALCITON 0.63  --   --   --   --  0.52 0.70  LATICACIDVEN  --  2.5* 2.7* 3.3* 3.3*  --   --     Recent Results (from the past 240 hour(s))  MRSA Next Gen by PCR, Nasal     Status: None   Collection Time: 10/23/21  7:01 PM   Specimen: Nasal Mucosa; Nasal Swab  Result Value Ref Range Status   MRSA by PCR Next Gen NOT DETECTED NOT DETECTED Final    Comment: (NOTE) The GeneXpert MRSA Assay (FDA approved for NASAL specimens only), is one  component of a comprehensive MRSA colonization surveillance program. It is not intended to diagnose MRSA infection nor to guide or monitor treatment for MRSA infections. Test performance is not FDA approved in patients less than 56 years old. Performed at [facility], [address] [place], [place] [address]          Radiology Studies: No results found.      Scheduled Meds:  allopurinol  100 mg Oral BID   atorvastatin  80 mg Oral QPM   Chlorhexidine Gluconate Cloth  6 each Topical Q0600   clopidogrel  75 mg Oral Daily   digoxin  0.125 mg Oral Once per day on Mon Wed Fri   docusate sodium  100 mg Oral BID   donepezil  10 mg Oral QHS   enoxaparin (LOVENOX) injection  40 mg Subcutaneous Q24H   feeding supplement  237 mL Oral BID BM   furosemide  20 mg Oral QODAY   gabapentin  400 mg Oral TID   guaiFENesin  1,200 mg Oral BID   insulin aspart  0-15 Units Subcutaneous TID WC   levothyroxine  100 mcg Oral Q0600   memantine  10 mg Oral BID   methocarbamol  750 mg Oral TID   metoprolol tartrate  12.5 mg Oral BID   mirtazapine  7.5 mg Oral BID   mometasone-formoterol  2 puff Inhalation BID   multivitamin with minerals  1 tablet Oral Daily   pantoprazole  40 mg Oral Daily   tiotropium  18 mcg Inhalation Daily   venlafaxine XR  75 mg Oral BID   Continuous Infusions:  sodium chloride Stopped (10/23/21 1958)   sodium chloride Stopped (10/25/21 0830)     LOS: 6 days     Sidney Ace, MD [facility]   If 7PM-7AM, please contact night-coverage  10/29/2021, 11:49 AM

## 2021-10-29 NOTE — Progress Notes (Addendum)
Physical Therapy Treatment Patient Details Name: Stacy Grimes MRN: [id number] DOB: 06-13-1950 Today's Date: 10/29/2021   History of Present Illness Pt is a 71 yo female s/p hip IM nailing due to a fall. Transferred to ICU for hypotension. PMH of HTN, CHF, COPD, cardiac defibrillator, DM, AAA, dementia, spinal fx, O2 use, CVA, L ear deafness.    PT Comments    Pt in bed this am.  Ready for session.  Husband in.  She is unable to recall why she is here and is surprised when told she broke her leg.  She is able to get to EOB with min a x 1 and cues.  She is inc urine and is able to stand today to walker with mod a x 2.  Mod a x 1 once up for pericare for urine and small BM.  After seated rest, she is able to transfer to recliner with shuffling steps but no true gait.  She remains in recliner with needs met.  Co-tx with OT   1 unit billed each per protocol.     Recommendations for follow up therapy are one component of a multi-disciplinary discharge planning process, led by the attending physician.  Recommendations may be updated based on patient status, additional functional criteria and insurance authorization.  Follow Up Recommendations  Skilled nursing-short term rehab (<3 hours/day)     Assistance Recommended at Discharge Frequent or constant Supervision/Assistance  Patient can return home with the following Two people to help with walking and/or transfers;Two people to help with bathing/dressing/bathroom;Help with stairs or ramp for entrance;Assistance with feeding;Assist for transportation;Direct supervision/assist for financial management;Assistance with cooking/housework   Equipment Recommendations       Recommendations for Other Services       Precautions / Restrictions Precautions Precautions: Fall Restrictions Weight Bearing Restrictions: Yes RLE Weight Bearing: Weight bearing as tolerated     Mobility  Bed Mobility Overal bed mobility: Needs Assistance Bed  Mobility: Supine to Sit, Sit to Supine     Supine to sit: Mod assist, Min assist Sit to supine: Max assist, +2 for physical assistance   General bed mobility comments: is able to scoot to EOB on her own today with time and encouragement    Transfers Overall transfer level: Needs assistance Equipment used: Rolling walker (2 wheels) Transfers: Sit to/from Stand Sit to Stand: Mod assist, +2 physical assistance           General transfer comment: unable to stand fully upright or fully clear hips off bed despite +2    Ambulation/Gait Ambulation/Gait assistance: Mod assist, +2 physical assistance Gait Distance (Feet): 2 Feet Assistive device: Rolling walker (2 wheels) Gait Pattern/deviations: Step-to pattern, Trunk flexed, Decreased step length - right, Decreased step length - left, Shuffle Gait velocity: decreased     General Gait Details: poor quality steps   Stairs             Wheelchair Mobility    Modified Rankin (Stroke Patients Only)       Balance Overall balance assessment: Needs assistance Sitting-balance support: Feet supported, Bilateral upper extremity supported Sitting balance-Leahy Scale: Fair Sitting balance - Comments: supervision static sitting   Standing balance support: Bilateral upper extremity supported, Reliant on assistive device for balance Standing balance-Leahy Scale: Zero                              Cognition Arousal/Alertness: Awake/alert Behavior During Therapy: [facility] for tasks assessed/performed  Overall Cognitive Status: Within Functional Limits for tasks assessed                                          Exercises Other Exercises Other Exercises: care for inc urine and BM    General Comments        Pertinent Vitals/Pain Pain Assessment Pain Assessment: Faces Faces Pain Scale: Hurts even more Pain Location: R hip with movement Pain Descriptors / Indicators: Discomfort, Grimacing,  Guarding Pain Intervention(s): Limited activity within patient's tolerance, Monitored during session, Repositioned    Home Living                          Prior Function            PT Goals (current goals can now be found in the care plan section) Progress towards PT goals: Progressing toward goals    Frequency    7X/week      PT Plan Current plan remains appropriate    Co-evaluation   Reason for Co-Treatment: For patient/therapist safety;To address functional/ADL transfers PT goals addressed during session: Mobility/safety with mobility OT goals addressed during session: ADL's and self-care      AM-PAC PT "6 Clicks" Mobility   Outcome Measure  Help needed turning from your back to your side while in a flat bed without using bedrails?: A Little Help needed moving from lying on your back to sitting on the side of a flat bed without using bedrails?: A Little Help needed moving to and from a bed to a chair (including a wheelchair)?: Total Help needed standing up from a chair using your arms (e.g., wheelchair or bedside chair)?: Total Help needed to walk in hospital room?: Total Help needed climbing 3-5 steps with a railing? : Total 6 Click Score: 10    End of Session Equipment Utilized During Treatment: Gait belt Activity Tolerance: Patient limited by fatigue Patient left: in bed;with call bell/phone within reach;with bed alarm set Nurse Communication: Mobility status;Other (comment) PT Visit Diagnosis: Other abnormalities of gait and mobility (R26.89);Difficulty in walking, not elsewhere classified (R26.2);Muscle weakness (generalized) (M62.81);Pain Pain - Right/Left: Right Pain - part of body: Hip     Time: 1027-1050 PT Time Calculation (min) (ACUTE ONLY): 23 min  Charges:  $Therapeutic Activity: 8-22 mins                   Chesley Noon, PTA 10/29/21, 12:51 PM

## 2021-10-29 NOTE — Discharge Instructions (Signed)
Diet: As you were doing prior to hospitalization   Shower:  May shower but keep the wounds dry, use an occlusive plastic wrap, NO SOAKING IN TUB.  If the bandage gets wet, change with a clean dry gauze.  Dressing:  You may change your dressing as needed. Change the dressing with sterile gauze dressing.    Activity:  Increase activity slowly as tolerated, but follow the weight bearing instructions below.  No lifting or driving for 6 weeks.  Weight Bearing:   Weight bearing as tolerated to right lower extremity  To prevent constipation: you may use a stool softener such as -  Colace (over the counter) 100 mg by mouth twice a day  Drink plenty of fluids (prune juice may be helpful) and high fiber foods Miralax (over the counter) for constipation as needed.    Itching:  If you experience itching with your medications, try taking only a single pain pill, or even half a pain pill at a time.  You may take up to 10 pain pills per day, and you can also use benadryl over the counter for itching or also to help with sleep.   Precautions:  If you experience chest pain or shortness of breath - call 911 immediately for transfer to the hospital emergency department!!  If you develop a fever greater that 101 F, purulent drainage from wound, increased redness or drainage from wound, or calf pain-Call [place]                                               Follow- Up Appointment:  Please call for an appointment to be seen in 6 weeks at [facility]

## 2021-10-29 NOTE — TOC Progression Note (Signed)
Transition of Care [facility]) - CM/SW Discharge Note   Patient Details  Name: Stacy Grimes MRN: [id number] Date of Birth: 1950/08/27  Transition of Care [facility]) CM/SW Contact:  Conception Oms, RN Phone Number: 10/29/2021, 10:53 AM   Clinical Narrative:    Reviewed the bed offers with the patient and her husband, they accepted the bed offer from [facility], I called and spoke to Arbie Cookey at [facility] and accepted the bed, Ins pending     Barriers to Discharge: Continued Medical Work up   Patient Goals and CMS Choice Patient states their goals for this hospitalization and ongoing recovery are:: SNF CMS Medicare.gov Compare Post Acute Care list provided to:: Patient Represenative (must comment) Choice offered to / list presented to : Spouse  Discharge Placement                       Discharge Plan and Services                                     Social Determinants of Health (SDOH) Interventions     Readmission Risk Interventions     No data to display

## 2021-10-29 NOTE — Care Management Important Message (Signed)
Important Message  Patient Details  Name: Stacy Grimes MRN: [id number] Date of Birth: [birth year]   Medicare Important Message Given:  Yes     [name] 10/29/2021, 11:19 AM

## 2021-10-29 NOTE — Progress Notes (Signed)
Subjective: 6 Days Post-Op Procedure(s) (LRB): INTRAMEDULLARY (IM) NAIL INTERTROCHANTERIC (Right) Patient's pain is moderate this morning.  She has to be reminded that she had surgery on her right hip. No new complaints. Received 1 unit of PRBC 10/27/2021. Plan is for discharge to SNF.   She has had a BM.  Objective: Vital signs in last 24 hours: Temp:  [97.5 F (36.4 C)-98.7 F (37.1 C)] 98.4 F (36.9 C) (09/25 0739) Pulse Rate:  [87-100] 87 (09/25 0739) Resp:  [15-20] 20 (09/24 2131) BP: (116-139)/(63-77) 132/72 (09/25 0739) SpO2:  [92 %-100 %] 95 % (09/25 0739)  Intake/Output from previous day:  Intake/Output Summary (Last 24 hours) at 10/29/2021 0744 Last data filed at 10/29/2021 0500 Gross per 24 hour  Intake 240 ml  Output 1150 ml  Net -910 ml    Intake/Output this shift: No intake/output data recorded.  Labs: Recent Labs    10/27/21 0437 10/27/21 1836 10/28/21 0505 10/29/21 0456  HGB 7.1* 8.4* 8.4* 8.6*   Recent Labs    10/28/21 0505 10/29/21 0456  WBC 11.3* 12.3*  RBC 3.30* 3.39*  HCT 26.8* 28.0*  PLT 245 267   Recent Labs    10/27/21 0437  NA 144  K 4.5  CL 106  CO2 32  BUN 16  CREATININE 0.69  GLUCOSE 86  CALCIUM 8.3*   No results for input(s): "LABPT", "INR" in the last 72 hours.   EXAM General - Patient is Alert and Appropriate.  Doesn't remember surgery on her right hip. Extremity - ABD soft Sensation intact distally Dorsiflexion/Plantar flexion intact Incision: bloody drainage noted to proximal incision site. No cellulitis present Compartment soft Dressing/Incision - honeycomb dressings intact, bloody drainage noted to proximal incision site.  No drainage noted to middle or distal incisions. Motor Function - intact, moving foot and toes well on exam.  Abdomen soft with intact bowel sounds this morning.  Past Medical History:  Diagnosis Date   AAA (abdominal aortic aneurysm) (HCC)    Anginal pain ([place])    Anxiety     Arthritis    Automatic implantable cardioverter-defibrillator in situ    Biventricular ICD  [place] [facility]    COPD (chronic obstructive pulmonary disease) (HCC)    Deafness    left ear   Dementia (HCC)    Depression    Dizziness    Dysrhythmia    Fracture    history of spinal fracture   GERD (gastroesophageal reflux disease)    HFrEF (heart failure with reduced ejection fraction) ([place])    a. 2010 Cath: nonobs dzs, EF 15-20%; b. 09/2017 Echo: EF suboptimal. EF low nl; c. 04/2018 Echo: EF 40-45%.   Hypertension    Hypothyroidism    Non-ischemic cardiomyopathy ([place])    a. 2010 Cath: nonobs dzs, EF 15-20%; b. 2011 s/p SJM CRT-D; c. 09/2016 s/p Gen change; d. 09/2017 Echo: EF suboptimal. EF low nl; e. 04/2018 Echo: EF 40-45%.   Oxygen dependent    Palpitations    Shortness of breath dyspnea    Sleep apnea    Wheezing    Assessment/Plan: 6 Days Post-Op Procedure(s) (LRB): INTRAMEDULLARY (IM) NAIL INTERTROCHANTERIC (Right) Principal Problem:   Hip fracture (HCC) Active Problems:   Hip fracture, unspecified laterality, closed, initial encounter (HCC)   CHF (congestive heart failure) (HCC)   Shock circulatory (HCC)  Estimated body mass index is 35.85 kg/m as calculated from the following:   Height as of this encounter: 5\' 3"  (1.6 m).   Weight as of this  encounter: 91.8 kg. Advance diet Up with therapy  Vital signs stable Hemoglobin 8.6, s/p 1 unit PRBC 10/27/2021 Patient has had a BM. Up with therapy today as tolerated. Plan is for SNF at discharge.  Upon discharge, staple can be removed by SNF on 11/06/21.  Follow-up with [facility] orthopaedics in 6 weeks for x-rays of the right femur. Continue Lovenox 40mg  daily for 14 days following discharge.  DVT Prophylaxis - Lovenox and TED hose Weight-Bearing as tolerated to right leg  J. Cameron Proud, PA-C [facility] 10/29/2021, 7:44 AM

## 2021-10-29 NOTE — Plan of Care (Signed)

## 2021-10-29 NOTE — Progress Notes (Addendum)
Occupational Therapy Treatment Patient Details Name: Stacy Grimes MRN: [id number] DOB: [birth year] Today's Date: 10/29/2021   History of present illness Pt is a 71 yo female s/p hip IM nailing due to a fall. Transferred to ICU for hypotension. PMH of HTN, CHF, COPD, cardiac defibrillator, DM, AAA, dementia, spinal fx, O2 use, CVA, L ear deafness.   OT comments  Pt and spouse agreeable to OT/PT co-treatment to maximize safety and participation. Pt with soiled bed upon arrival requiring complete linen change. She was agreeable to sit up in recliner in order for PT/OT to change linens. She required Min A for supine to sit. Pt then stood 2x from EOB in order to change soiled brief and complete posterior hygiene in standing. Pt took several steps to recliner with Mod A +2 using RW. Once sitting in recliner, pt completed grooming tasks with set up-Min A and then was left with all needs in reach. Pt is making progress toward goal completion. D/C recommendation remains appropriate. OT will continue to follow acutely.    Recommendations for follow up therapy are one component of a multi-disciplinary discharge planning process, led by the attending physician.  Recommendations may be updated based on patient status, additional functional criteria and insurance authorization.    Follow Up Recommendations  Skilled nursing-short term rehab (<3 hours/day)    Assistance Recommended at Discharge Frequent or constant Supervision/Assistance  Patient can return home with the following  A lot of help with walking and/or transfers;A lot of help with bathing/dressing/bathroom   Equipment Recommendations  Hospital bed    Recommendations for Other Services      Precautions / Restrictions Precautions Precautions: Fall Restrictions Weight Bearing Restrictions: Yes RLE Weight Bearing: Weight bearing as tolerated       Mobility Bed Mobility Overal bed mobility: Needs Assistance Bed Mobility: Supine to Sit      Supine to sit: Min assist          Transfers Overall transfer level: Needs assistance Equipment used: Rolling walker (2 wheels) Transfers: Sit to/from Stand Sit to Stand: Mod assist, +2 physical assistance (2x from EOB)           General transfer comment: unable to stand fully upright despite +2     Balance Overall balance assessment: Needs assistance Sitting-balance support: Feet supported, Bilateral upper extremity supported Sitting balance-Leahy Scale: Fair Sitting balance - Comments: supervision static sitting   Standing balance support: Bilateral upper extremity supported, Reliant on assistive device for balance Standing balance-Leahy Scale: Zero Standing balance comment: unable to stand fully upright despite +2                           ADL either performed or assessed with clinical judgement   ADL Overall ADL's : Needs assistance/impaired     Grooming: Wash/dry face;Brushing hair;Minimal assistance;Set up;Sitting Grooming Details (indicate cue type and reason): Min A to brush posterior aspect of hair             Lower Body Dressing: Maximal assistance;Sitting/lateral leans;Sit to/from stand Lower Body Dressing Details (indicate cue type and reason): Max A to don/doff briefs. Required assistance to thread BLEs through brief in sitting and then to hike briefs over B hips in standing     Toileting- Clothing Manipulation and Hygiene: Maximal assistance;Sit to/from stand Toileting - Clothing Manipulation Details (indicate cue type and reason): Max A for posterior hygiene in standing     Functional mobility during ADLs: Moderate assistance;+2  for physical assistance;Rolling walker (2 wheels);Cueing for sequencing;Cueing for safety (for ~3 steps from EOB > recliner)      Extremity/Trunk Assessment Upper Extremity Assessment Upper Extremity Assessment: Generalized weakness   Lower Extremity Assessment Lower Extremity Assessment: Generalized  weakness        Vision       Perception     Praxis      Cognition Arousal/Alertness: Awake/alert Behavior During Therapy: WFL for tasks assessed/performed Overall Cognitive Status: Within Functional Limits for tasks assessed                                 General Comments: able to follow all commands well, engaged well in conversation this date with husband present        Exercises      Shoulder Instructions       General Comments      Pertinent Vitals/ Pain       Pain Assessment Pain Assessment: Faces Faces Pain Scale: Hurts even more Pain Location: R hip with movement Pain Descriptors / Indicators: Discomfort, Grimacing, Guarding Pain Intervention(s): Limited activity within patient's tolerance, Monitored during session, Repositioned  Home Living                                          Prior Functioning/Environment              Frequency  Min 2X/week        Progress Toward Goals  OT Goals(current goals can now be found in the care plan section)  Progress towards OT goals: Progressing toward goals  Acute Rehab OT Goals Patient Stated Goal: to go home OT Goal Formulation: With patient/family Time For Goal Achievement: 11/07/21 Potential to Achieve Goals: Good  Plan Discharge plan remains appropriate;Frequency remains appropriate    Co-evaluation    PT/OT/SLP Co-Evaluation/Treatment: Yes Reason for Co-Treatment: For patient/therapist safety;To address functional/ADL transfers PT goals addressed during session: Mobility/safety with mobility OT goals addressed during session: ADL's and self-care      AM-PAC OT "6 Clicks" Daily Activity     Outcome Measure   Help from another person eating meals?: None Help from another person taking care of personal grooming?: A Little Help from another person toileting, which includes using toliet, bedpan, or urinal?: A Lot Help from another person bathing (including  washing, rinsing, drying)?: A Lot Help from another person to put on and taking off regular upper body clothing?: A Lot Help from another person to put on and taking off regular lower body clothing?: A Lot 6 Click Score: 15    End of Session Equipment Utilized During Treatment: Gait belt;Rolling walker (2 wheels)  OT Visit Diagnosis: Repeated falls (R29.6);Muscle weakness (generalized) (M62.81)   Activity Tolerance Patient tolerated treatment well;Patient limited by pain   Patient Left in chair;with call bell/phone within reach;with family/visitor present   Nurse Communication Mobility status        Time: 1027-1050 OT Time Calculation (min): 23 min  Charges: OT General Charges $OT Visit: 1 Visit OT Treatments $Self Care/Home Management : 8-22 mins  [facility] MS, OTR/L ascom [phone]  10/29/21, 3:58 PM

## 2021-10-30 DIAGNOSIS — S72141D Displaced intertrochanteric fracture of right femur, subsequent encounter for closed fracture with routine healing: Secondary | ICD-10-CM | POA: Diagnosis not present

## 2021-10-30 DIAGNOSIS — Z7401 Bed confinement status: Secondary | ICD-10-CM | POA: Diagnosis not present

## 2021-10-30 DIAGNOSIS — S72352D Displaced comminuted fracture of shaft of left femur, subsequent encounter for closed fracture with routine healing: Secondary | ICD-10-CM | POA: Diagnosis not present

## 2021-10-30 DIAGNOSIS — R944 Abnormal results of kidney function studies: Secondary | ICD-10-CM | POA: Diagnosis not present

## 2021-10-30 DIAGNOSIS — S7291XA Unspecified fracture of right femur, initial encounter for closed fracture: Secondary | ICD-10-CM | POA: Diagnosis not present

## 2021-10-30 DIAGNOSIS — S72001D Fracture of unspecified part of neck of right femur, subsequent encounter for closed fracture with routine healing: Secondary | ICD-10-CM | POA: Diagnosis not present

## 2021-10-30 DIAGNOSIS — R062 Wheezing: Secondary | ICD-10-CM | POA: Diagnosis not present

## 2021-10-30 DIAGNOSIS — F0390 Unspecified dementia without behavioral disturbance: Secondary | ICD-10-CM | POA: Diagnosis not present

## 2021-10-30 DIAGNOSIS — R5381 Other malaise: Secondary | ICD-10-CM | POA: Diagnosis not present

## 2021-10-30 DIAGNOSIS — I4891 Unspecified atrial fibrillation: Secondary | ICD-10-CM | POA: Diagnosis not present

## 2021-10-30 DIAGNOSIS — I509 Heart failure, unspecified: Secondary | ICD-10-CM | POA: Diagnosis not present

## 2021-10-30 DIAGNOSIS — I959 Hypotension, unspecified: Secondary | ICD-10-CM | POA: Diagnosis not present

## 2021-10-30 LAB — CBC WITH DIFFERENTIAL/PLATELET
Abs Immature Granulocytes: 0.25 10*3/uL — ABNORMAL HIGH (ref 0.00–0.07)
Basophils Absolute: 0.1 10*3/uL (ref 0.0–0.1)
Basophils Relative: 1 %
Eosinophils Absolute: 0.4 10*3/uL (ref 0.0–0.5)
Eosinophils Relative: 4 %
HCT: 29.2 % — ABNORMAL LOW (ref 36.0–46.0)
Hemoglobin: 8.6 g/dL — ABNORMAL LOW (ref 12.0–15.0)
Immature Granulocytes: 2 %
Lymphocytes Relative: 13 %
Lymphs Abs: 1.5 10*3/uL (ref 0.7–4.0)
MCH: 24.8 pg — ABNORMAL LOW (ref 26.0–34.0)
MCHC: 29.5 g/dL — ABNORMAL LOW (ref 30.0–36.0)
MCV: 84.1 fL (ref 80.0–100.0)
Monocytes Absolute: 1 10*3/uL (ref 0.1–1.0)
Monocytes Relative: 9 %
Neutro Abs: 8.6 10*3/uL — ABNORMAL HIGH (ref 1.7–7.7)
Neutrophils Relative %: 71 %
Platelets: 269 10*3/uL (ref 150–400)
RBC: 3.47 MIL/uL — ABNORMAL LOW (ref 3.87–5.11)
RDW: 20.9 % — ABNORMAL HIGH (ref 11.5–15.5)
WBC: 11.9 10*3/uL — ABNORMAL HIGH (ref 4.0–10.5)
nRBC: 1.2 % — ABNORMAL HIGH (ref 0.0–0.2)

## 2021-10-30 LAB — GLUCOSE, CAPILLARY: Glucose-Capillary: 106 mg/dL — ABNORMAL HIGH (ref 70–99)

## 2021-10-30 MED ORDER — METHOCARBAMOL 750 MG PO TABS: 750.0000 mg | ORAL_TABLET | Freq: Three times a day (TID) | ORAL | Status: DC

## 2021-10-30 MED ORDER — GABAPENTIN 400 MG PO CAPS: 400.0000 mg | ORAL_CAPSULE | Freq: Three times a day (TID) | ORAL | Status: DC

## 2021-10-30 NOTE — TOC Progression Note (Signed)
Transition of Care [facility]) - Progression Note    Patient Details  Name: Stacy Grimes MRN: [id number] Date of Birth: 05/15/1950  Transition of Care [facility]) CM/SW [place], RN Phone Number: 10/30/2021, 10:47 AM  Clinical Narrative:     Patient going to room 307 at [facility] in [place], patient and husband aware EMS called  Expected Discharge Plan: Skilled Nursing Facility Barriers to Discharge: Continued Medical Work up  Expected Discharge Plan and Services Expected Discharge Plan: [place] arrangements for the past 2 months: Single Family Home Expected Discharge Date: 10/30/21                                     Social Determinants of Health (SDOH) Interventions    Readmission Risk Interventions     No data to display

## 2021-10-30 NOTE — Plan of Care (Signed)

## 2021-10-30 NOTE — Discharge Summary (Signed)
Physician Discharge Summary  Stacy Grimes WGN:[id number] DOB: 09-04-1950 DOA: 10/23/2021  PCP: Cyndi Bender, PA-C  Admit date: 10/23/2021 Discharge date: 10/30/2021  Admitted From: Home Disposition:  SNF  Recommendations for Outpatient Follow-up:  Follow up with PCP in 1-2 weeks Follow up orthopedics 10-14 days  Home Health:No  Equipment/Devices:Oxygen 3L via [place]   Discharge Condition:Stable  CODE STATUS:FULL  Diet recommendation: Heart healthy  Brief/Interim Summary: 71 y.o. female with medical history significant of nonischemic cardiomyopathy with our UF 20-25%, status post AICD, COPD with chronic hypoxic respite failure on 3-4 L continuously at home, CVA, HTN, AAA, chronic normocytic anemia, presented with mechanical fall and right hip pain.   Appears that patient has a chronic right hip/knee pain, and occasionally she has had episodes of " right leg give up" with near fall and falls.  She had at least 2-3 episode before today.  This morning, patient went to the bathroom, while trying to stand up from toilet bowl, she again felt right leg weakness and fell on the right side and hit her hip.  She denies any prodrome of lightheadedness chest pain dizziness shortness of breath.   She has a chronic O2 dependent on 3 to 4 L, but over the weekend, patient developed a new nonproductive cough and wheezing, family has turned oxygen to 5 L yesterday.  Denies any chest pain no fever chills.   CT pelvis showed right femoral comminuted fracture with intertrochanteric and cervical components.  Taken operating room with orthopedics on 9/19.  Tolerated procedure well however postoperatively became hypotensive requiring initiation of vasopressors.  Was transferred to stepdown unit with PCCM consultation.   Following morning maps are improving.  PCCM remains engaged.  Patient switched to Levophed from Neo-Synephrine.  Remains on 5 L.   9/21: Discussed with PCCM.  Vasopressors weaned off.  PCCM  signing off.  Blood pressure stabilized.   9/22: Patient's clinical status starting to improve.  Oxygen weaned to baseline 3 L.  Mental status improved this morning.  Patient endorsing pain in affected extremity.  9/26: Pain mild to moderate.  Clinically stable otherwise.  Appropriate for dc to SNF.  FU OP orthopedics 10-14 days for femur xrays    Discharge Diagnoses:  Principal Problem:   Hip fracture ([place]) Active Problems:   Hip fracture, unspecified laterality, closed, initial encounter ([place])   CHF (congestive heart failure) (H[facility])   Shock circulatory (H[facility])  Postoperative hypotension/shock, resolved Unclear etiology.  Suspect related to hypovolemia due to intraoperative blood loss.  Cardiogenic shock versus iatrogenic related to pain medication and anesthesia remains on differential Weaned off vasopressors 9/21 BP stabilized Plan: Continue low dose metoprolol Continue Lasix per home dose   Acute COPD exacerbation Acute on chronic hypoxic respiratory failure -Active wheezing cough, increasing oxygen demand -No clearly infiltrates or consolidation on chest x-ray afebrile, no leukocytosis Respiratory status improved No wheezing noted Plan: Completed 5 days prednisone as inpatient Resume PTA bronchodilator regimen on dc currently on 3 L baseline   Chronic HFrEF -Euvolemic -Lasix resumed, 20 mg every other day   Right proximal femoral comminuted fracture -Tolerated procedure well  -Postop hypotension noted -Orthopedic follow-up for wound check -Therapy as tolerated -TOC consult for skilled nursing facility placement - DC to SNF 9/26   PAF -On sinus rhythm, continue digoxin Monday Wednesday Friday -Beta-blocker resumed   IIDM Resume home regimen   Diabetic neuropathy -Continue gabapentin. Dose increased to 400 TID.  Can consider reducing dose once pain control improved   Hx  of CVA -Continue aspirin, restarted Plavix 9/21   Hypothyroidism, early  dementia -Stable   Acute kidney injury Suspect secondary to ATN versus prerenal azotemia Kidney function baseline at time of dc  Discharge Instructions  Discharge Instructions     Diet - low sodium heart healthy   Complete by: As directed    Increase activity slowly   Complete by: As directed    No wound care   Complete by: As directed       Allergies as of 10/30/2021       Reactions   Codeine Palpitations   Contrast Media [iodinated Contrast Media] Rash        Medication List     TAKE these medications    albuterol 0.63 MG/3ML nebulizer solution Commonly known as: ACCUNEB Take 1 ampule by nebulization every 6 (six) hours as needed for wheezing.   albuterol 108 (90 Base) MCG/ACT inhaler Commonly known as: VENTOLIN HFA Inhale 2 puffs into the lungs every 6 (six) hours as needed.   allopurinol 100 MG tablet Commonly known as: ZYLOPRIM Take 300 mg by mouth daily.   atorvastatin 80 MG tablet Commonly known as: Lipitor Take 1 tablet (80 mg total) by mouth daily.   BENGAY EX Apply 1 application topically daily as needed (back pain).   budesonide-formoterol 160-4.5 MCG/ACT inhaler Commonly known as: SYMBICORT Inhale 2 puffs into the lungs 2 (two) times daily.   clopidogrel 75 MG tablet Commonly known as: PLAVIX Take 1 tablet (75 mg total) by mouth daily.   cyanocobalamin 1000 MCG tablet Commonly known as: VITAMIN B12 Take 1,000 mcg by mouth 2 (two) times daily.   digoxin 0.125 MG tablet Commonly known as: LANOXIN TAKE 1 TABLET BY MOUTH ON MONDAY, WEDNESDAY AND FRIDAY   donepezil 10 MG tablet Commonly known as: ARICEPT Take 10 mg by mouth at bedtime.   enoxaparin 40 MG/0.4ML injection Commonly known as: LOVENOX Inject 0.4 mLs (40 mg total) into the skin daily.   EX-LAX PO Take 2 tablets by mouth at bedtime as needed (constipation).   furosemide 20 MG tablet Commonly known as: LASIX Take 1 tablet (20 mg total) by mouth every other day. May take  an additional tablet AS NEEDED for weight gain of 3 lbs overnight or 5 lbs in one week. Do not exceed more than 3 additional doses. - Oral   gabapentin 400 MG capsule Commonly known as: NEURONTIN Take 1 capsule (400 mg total) by mouth 3 (three) times daily. What changed:  medication strength how much to take when to take this reasons to take this   HYDROcodone-acetaminophen 7.5-325 MG tablet Commonly known as: NORCO Take 1-2 tablets by mouth every 4 (four) hours as needed for severe pain (pain score 7-10).   hydrOXYzine 25 MG capsule Commonly known as: VISTARIL Take 25 mg by mouth at bedtime as needed for anxiety.   levothyroxine 112 MCG tablet Commonly known as: SYNTHROID Take 112 mcg by mouth daily before breakfast.   losartan 25 MG tablet Commonly known as: COZAAR Take 0.5 tablets (12.5 mg total) by mouth daily.   memantine 10 MG tablet Commonly known as: NAMENDA Take 10 mg by mouth in the morning and at bedtime.   metFORMIN 500 MG 24 hr tablet Commonly known as: GLUCOPHAGE-XR Take 500 mg by mouth 2 (two) times daily with a meal.   methocarbamol 750 MG tablet Commonly known as: ROBAXIN Take 1 tablet (750 mg total) by mouth 3 (three) times daily.   metoprolol succinate 25  MG 24 hr tablet Commonly known as: TOPROL-XL Take 25 mg by mouth daily.   mirtazapine 7.5 MG tablet Commonly known as: REMERON Take 7.5 mg by mouth 2 (two) times daily.   omeprazole 20 MG capsule Commonly known as: PRILOSEC Take 1 capsule (20 mg total) by mouth every morning.   potassium chloride SA 20 MEQ tablet Commonly known as: KLOR-CON M Take 1 tablet (20 mEq total) by mouth daily.   tiotropium 18 MCG inhalation capsule Commonly known as: SPIRIVA Place 1 capsule (18 mcg total) into inhaler and inhale daily as needed (shortness of breath).   venlafaxine XR 75 MG 24 hr capsule Commonly known as: EFFEXOR-XR Take 75 mg by mouth 2 (two) times daily.        Contact information for  follow-up providers     Lattie Corns, PA-C Follow up in 6 week(s).   Specialty: Physician Assistant Why: Upon discharge, staple can be removed by SNF on 11/06/21.  Follow-up with [facility] orthopaedics in 6 weeks for x-rays of the right femur. Continue Lovenox 40mg  daily for 14 days following discharge. Contact information: [place] [address] [phone]              Contact information for after-discharge care     Destination     [facility] Preferred SNF .   Service: Skilled Nursing Contact information: [address] [place] [address] [phone]                    Allergies  Allergen Reactions   Codeine Palpitations   Contrast Media [Iodinated Contrast Media] Rash    Consultations: Orthopedics PCCM   Procedures/Studies: ECHOCARDIOGRAM COMPLETE  Result Date: 10/24/2021    ECHOCARDIOGRAM REPORT   Patient Name:   Stacy Grimes [facility] Date of Exam: 10/23/2021 Medical Rec #:  [id number]        Height:       63.0 in Accession #:    [id number]       Weight:       210.1 lb Date of Birth:  06/14/1950         BSA:          1.975 m Patient Age:    36 years         BP:           100/60 mmHg Patient Gender: F                HR:           95 bpm. Exam Location:  [facility] Procedure: 2D Echo, Cardiac Doppler, Color Doppler and Intracardiac            Opacification Agent Indications:     T73.22 Acute Systolic CHF  History:         Patient has prior history of Echocardiogram examinations, most                  recent 06/23/2020. CHF, COPD, Signs/Symptoms:Shortness of                  Breath; Risk Factors:Hypertension and Sleep Apnea. AAA.                  Nonishemic cardiomyopathy. Palpitations. Hypothyroidism.  Sonographer:     Cresenciano Lick RDCS Referring Phys:  [id number] Bradly Bienenstock Diagnosing Phys: Nelva Bush MD IMPRESSIONS  1. Left ventricular ejection fraction, by estimation, is 40 to 45%. The left ventricle  has mildly decreased function. The left ventricle demonstrates global hypokinesis. There is mild left ventricular hypertrophy. Indeterminate diastolic filling due to E-A fusion.  2. Right ventricular systolic function is mildly reduced. The right ventricular size is moderately enlarged. There is severely elevated pulmonary artery systolic pressure.  3. The mitral valve is normal in structure. Trivial mitral valve regurgitation.  4. Tricuspid valve regurgitation is mild to moderate.  5. The aortic valve is tricuspid. Aortic valve regurgitation is not visualized. No aortic stenosis is present.  6. The inferior vena cava is normal in size with <50% respiratory variability, suggesting right atrial pressure of 8 mmHg. Comparison(s): A prior study was performed on 06/22/2020. LVEF is significantly higher. Right ventricle appears dilated and hypokinetic on today's study. FINDINGS  Left Ventricle: Left ventricular ejection fraction, by estimation, is 40 to 45%. The left ventricle has mildly decreased function. The left ventricle demonstrates global hypokinesis. Definity contrast agent was given IV to delineate the left ventricular  endocardial borders. The left ventricular internal cavity size was normal in size. There is mild left ventricular hypertrophy. Indeterminate diastolic filling due to E-A fusion. Right Ventricle: The right ventricular size is moderately enlarged. No increase in right ventricular wall thickness. Right ventricular systolic function is mildly reduced. There is severely elevated pulmonary artery systolic pressure. The tricuspid regurgitant velocity is 4.07 m/s, and with an assumed right atrial pressure of 8 mmHg, the estimated right ventricular systolic pressure is 87.5 mmHg. Left Atrium: Left atrial size was normal in size. Right Atrium: Right atrial size was normal in size. Pericardium: There is no evidence of pericardial effusion. Mitral Valve: The mitral valve is normal in structure. Trivial mitral  valve regurgitation. Tricuspid Valve: The tricuspid valve is normal in structure. Tricuspid valve regurgitation is mild to moderate. Aortic Valve: The aortic valve is tricuspid. Aortic valve regurgitation is not visualized. No aortic stenosis is present. Pulmonic Valve: The pulmonic valve was not well visualized. Pulmonic valve regurgitation is not visualized. No evidence of pulmonic stenosis. Aorta: The aortic root is normal in size and structure. Pulmonary Artery: The pulmonary artery is of normal size. Venous: The inferior vena cava is normal in size with less than 50% respiratory variability, suggesting right atrial pressure of 8 mmHg. IAS/Shunts: The interatrial septum was not well visualized. Additional Comments: A device lead is visualized in the right atrium and right ventricle.  LEFT VENTRICLE PLAX 2D LVIDd:         4.70 cm   Diastology LVIDs:         3.70 cm   LV e' medial:    7.24 cm/s LV PW:         1.21 cm   LV E/e' medial:  16.3 LV IVS:        0.97 cm   LV e' lateral:   5.44 cm/s LVOT diam:     1.70 cm   LV E/e' lateral: 21.7 LV SV:         35 LV SV Index:   18 LVOT Area:     2.27 cm  RIGHT VENTRICLE             IVC RV Basal diam:  4.80 cm     IVC diam: 1.90 cm RV S prime:     12.60 cm/s TAPSE (M-mode): 1.9 cm LEFT ATRIUM             Index        RIGHT ATRIUM  Index LA diam:        4.40 cm 2.23 cm/m   RA Area:     16.20 cm LA Vol (A2C):   37.6 ml 19.04 ml/m  RA Volume:   50.90 ml  25.78 ml/m LA Vol (A4C):   31.6 ml 16.00 ml/m LA Biplane Vol: 35.3 ml 17.88 ml/m  AORTIC VALVE LVOT Vmax:   111.67 cm/s LVOT Vmean:  66.600 cm/s LVOT VTI:    0.154 m  AORTA Ao Root diam: 3.00 cm MV E velocity: 118.00 cm/s  TRICUSPID VALVE MV A velocity: 132.00 cm/s  TR Peak grad:   66.3 mmHg MV E/A ratio:  0.89         TR Vmax:        407.00 cm/s                              SHUNTS                             Systemic VTI:  0.15 m                             Systemic Diam: 1.70 cm Nelva Bush MD  Electronically signed by Nelva Bush MD Signature Date/Time: 10/24/2021/7:18:44 AM    Final    DG Chest Port 1 View  Result Date: 10/24/2021 CLINICAL DATA:  Acute on chronic respiratory failure EXAM: PORTABLE CHEST 1 VIEW COMPARISON:  10/23/2021 FINDINGS: Cardiac shadow is stable. Defibrillator is again noted and stable. Aortic calcifications are seen. The lungs are well aerated bilaterally. No focal infiltrate or effusion is seen. No bony abnormality is noted. IMPRESSION: No acute abnormality noted. Electronically Signed   By: Inez Catalina M.D.   On: 10/24/2021 04:15   DG Chest 1 View  Result Date: 10/23/2021 CLINICAL DATA:  Fluid overload.  History of CHF. EXAM: CHEST  1 VIEW COMPARISON:  Radiograph earlier today. FINDINGS: Left-sided pacemaker in place. Similar cardiomegaly. Aortic atherosclerosis. Increasing interstitial opacities typical of pulmonary edema. There is hazy opacity throughout the left hemithorax which may represent layering effusion. No confluent consolidation. No pneumothorax. IMPRESSION: 1. Increasing interstitial opacities typical of pulmonary edema. 2. Hazy opacity throughout the left hemithorax which may represent layering effusion. 3. Cardiomegaly. Electronically Signed   By: Keith Rake M.D.   On: 10/23/2021 17:58   DG HIP UNILAT WITH PELVIS 1V RIGHT  Result Date: 10/23/2021 CLINICAL DATA:  Fracture right femur EXAM: DG HIP (WITH OR WITHOUT PELVIS) 1V RIGHT COMPARISON:  Study done earlier today FINDINGS: Fluoroscopic images show reduction and internal fixation of comminuted intertrochanteric fracture of neck of right femur with intramedullary rod. Fluoroscopy time 1 minutes and 45 seconds. Radiation dose 23.21 mGy. IMPRESSION: Fluoroscopic assistance was provided for reduction and internal fixation of intertrochanteric fracture of proximal right femur. Electronically Signed   By: Elmer Picker M.D.   On: 10/23/2021 15:15   DG C-Arm 1-60 Min-No Report  Result  Date: 10/23/2021 Fluoroscopy was utilized by the requesting physician.  No radiographic interpretation.   CT Hip Right Wo Contrast  Result Date: 10/23/2021 CLINICAL DATA:  Right hip fracture EXAM: CT OF THE RIGHT HIP WITHOUT CONTRAST TECHNIQUE: Multidetector CT imaging of the right hip was performed according to the standard protocol. Multiplanar CT image reconstructions were also generated. RADIATION DOSE REDUCTION: This exam was performed according to  the departmental dose-optimization program which includes automated exposure control, adjustment of the mA and/or kV according to patient size and/or use of iterative reconstruction technique. COMPARISON:  10/23/2021 FINDINGS: Bones/Joint/Cartilage Acute comminuted fracture of the proximal right femur with intertrochanteric and basicervical components. Moderate shortening with mild varus angulation. There are a few mildly displaced fracture fragments including dominant 2.3 x 0.6 cm fragment along the posterior aspect of the femoral neck. Femoroacetabular joint alignment is maintained without dislocation. Mild hip joint space narrowing. The visualized portion of the right hemipelvis is intact. Ligaments Suboptimally assessed by CT. Muscles and Tendons No acute musculotendinous abnormality by CT. Soft tissues No fluid collection or hematoma.  No right inguinal lymphadenopathy. IMPRESSION: Acute comminuted fracture of the proximal right femur with intertrochanteric and basicervical components. Electronically Signed   By: Davina Poke D.O.   On: 10/23/2021 09:48   DG Chest Port 1 View  Result Date: 10/23/2021 CLINICAL DATA:  [place].  Right hip fracture. EXAM: PORTABLE CHEST 1 VIEW COMPARISON:  12/15/2019 FINDINGS: The pacer wires/AICD are stable. Stable cardiac enlargement. Stable mild tortuosity and calcification of the thoracic aorta. The lungs are clear. No pleural effusion or pneumothorax. The bony thorax is intact. IMPRESSION: No acute cardiopulmonary  findings. Electronically Signed   By: Marijo Sanes M.D.   On: 10/23/2021 08:09   DG Foot Complete Right  Result Date: 10/23/2021 CLINICAL DATA:  Tripped and fell.  Right foot pain. EXAM: RIGHT FOOT COMPLETE - 3+ VIEW COMPARISON:  None Available. FINDINGS: The joint spaces are fairly well maintained. Minimal degenerative changes. No acute fracture is identified. IMPRESSION: No acute bony findings. Electronically Signed   By: Marijo Sanes M.D.   On: 10/23/2021 08:03   DG Hip Unilat With Pelvis 2-3 Views Right  Result Date: 10/23/2021 CLINICAL DATA:  [place].  Right hip pain. EXAM: DG HIP (WITH OR WITHOUT PELVIS) 2-3V RIGHT COMPARISON:  None Available. FINDINGS: There is a displaced low femoral neck fracture with mild comminution. The left hip is intact. The pubic symphysis and SI joints are intact. No pelvic fractures are identified. IMPRESSION: Displaced low femoral neck fracture. Electronically Signed   By: Marijo Sanes M.D.   On: 10/23/2021 08:01      Subjective: Seen and examined on day of DC.  Pain mild to moderate  Discharge Exam: Vitals:   10/29/21 2123 10/30/21 0839  BP: (!) 132/57 134/66  Pulse: 92 97  Resp: 18 16  Temp: 98.1 F (36.7 C) 98.5 F (36.9 C)  SpO2: 95% 96%   Vitals:   10/29/21 1732 10/29/21 2123 10/30/21 0500 10/30/21 0839  BP: 126/68 (!) 132/57  134/66  Pulse: 95 92  97  Resp:  18  16  Temp: 97.9 F (36.6 C) 98.1 F (36.7 C)  98.5 F (36.9 C)  TempSrc:      SpO2: 95% 95%  96%  Weight:   93.3 kg   Height:        General: Pt is alert, awake, not in acute distress Cardiovascular: RRR, S1/S2 +, no rubs, no gallops Respiratory: CTA bilaterally, no wheezing, no rhonchi Abdominal: Soft, NT, ND, bowel sounds + Extremities: no edema, no cyanosis    The results of significant diagnostics from this hospitalization (including imaging, microbiology, ancillary and laboratory) are listed below for reference.     Microbiology: Recent Results (from the past  240 hour(s))  MRSA Next Gen by PCR, Nasal     Status: None   Collection Time: 10/23/21  7:01 PM  Specimen: Nasal Mucosa; Nasal Swab  Result Value Ref Range Status   MRSA by PCR Next Gen NOT DETECTED NOT DETECTED Final    Comment: (NOTE) The GeneXpert MRSA Assay (FDA approved for NASAL specimens only), is one component of a comprehensive MRSA colonization surveillance program. It is not intended to diagnose MRSA infection nor to guide or monitor treatment for MRSA infections. Test performance is not FDA approved in patients less than 13 years old. Performed at [facility], [address] [place],  [address]      Labs: BNP (last 3 results) Recent Labs    10/23/21 1939  BNP 948.5*   Basic Metabolic Panel: Recent Labs  Lab 10/23/21 1939 10/24/21 0209 10/25/21 0326 10/26/21 0549 10/27/21 0437  NA 143 146* 142 142 144  K 3.6 4.1 4.0 4.1 4.5  CL 100 105 104 104 106  CO2 31 33* 30 32 32  GLUCOSE 138* 119* 117* 143* 86  BUN 15 18 19 19 16   CREATININE 1.18* 1.27* 0.96 0.87 0.69  CALCIUM 7.9* 7.7* 7.7* 8.3* 8.3*  MG 1.4* 2.3 2.6* 2.5* 2.3  PHOS 4.2 5.9* 3.2 2.4* 2.8   Liver Function Tests: Recent Labs  Lab 10/23/21 1939  ALBUMIN 2.5*   No results for input(s): "LIPASE", "AMYLASE" in the last 168 hours. No results for input(s): "AMMONIA" in the last 168 hours. CBC: Recent Labs  Lab 10/26/21 0549 10/27/21 0437 10/27/21 1836 10/28/21 0505 10/29/21 0456 10/30/21 0432  WBC 12.6* 10.1  --  11.3* 12.3* 11.9*  NEUTROABS 10.2* 7.5  --  7.8* 8.5* 8.6*  HGB 8.0* 7.1* 8.4* 8.4* 8.6* 8.6*  HCT 27.5* 23.8* 27.3* 26.8* 28.0* 29.2*  MCV 83.1 81.8  --  81.2 82.6 84.1  PLT 220 238  --  245 267 269   Cardiac Enzymes: No results for input(s): "CKTOTAL", "CKMB", "CKMBINDEX", "TROPONINI" in the last 168 hours. BNP: Invalid input(s): "POCBNP" CBG: Recent Labs  Lab 10/29/21 0805 10/29/21 1205 10/29/21 1645 10/29/21 2120 10/30/21 0759  GLUCAP 95 131*  135* 131* 106*   D-Dimer No results for input(s): "DDIMER" in the last 72 hours. Hgb A1c No results for input(s): "HGBA1C" in the last 72 hours. Lipid Profile No results for input(s): "CHOL", "HDL", "LDLCALC", "TRIG", "CHOLHDL", "LDLDIRECT" in the last 72 hours. Thyroid function studies No results for input(s): "TSH", "T4TOTAL", "T3FREE", "THYROIDAB" in the last 72 hours.  Invalid input(s): "FREET3" Anemia work up No results for input(s): "VITAMINB12", "FOLATE", "FERRITIN", "TIBC", "IRON", "RETICCTPCT" in the last 72 hours. Urinalysis    Component Value Date/Time   COLORURINE YELLOW (A) 12/15/2019 0905   APPEARANCEUR CLOUDY (A) 12/15/2019 0905   APPEARANCEUR Clear 07/31/2013 1658   LABSPEC 1.023 12/15/2019 0905   LABSPEC 1.018 07/31/2013 1658   PHURINE 5.0 12/15/2019 0905   GLUCOSEU 50 (A) 12/15/2019 0905   GLUCOSEU Negative 07/31/2013 1658   HGBUR NEGATIVE 12/15/2019 0905   BILIRUBINUR NEGATIVE 12/15/2019 0905   BILIRUBINUR Negative 07/31/2013 1658   KETONESUR NEGATIVE 12/15/2019 0905   PROTEINUR 100 (A) 12/15/2019 0905   UROBILINOGEN 0.2 03/28/2012 1526   NITRITE NEGATIVE 12/15/2019 0905   LEUKOCYTESUR NEGATIVE 12/15/2019 0905   LEUKOCYTESUR Negative 07/31/2013 1658   Sepsis Labs Recent Labs  Lab 10/27/21 0437 10/28/21 0505 10/29/21 0456 10/30/21 0432  WBC 10.1 11.3* 12.3* 11.9*   Microbiology Recent Results (from the past 240 hour(s))  MRSA Next Gen by PCR, Nasal     Status: None   Collection Time: 10/23/21  7:01 PM  Specimen: Nasal Mucosa; Nasal Swab  Result Value Ref Range Status   MRSA by PCR Next Gen NOT DETECTED NOT DETECTED Final    Comment: (NOTE) The GeneXpert MRSA Assay (FDA approved for NASAL specimens only), is one component of a comprehensive MRSA colonization surveillance program. It is not intended to diagnose MRSA infection nor to guide or monitor treatment for MRSA infections. Test performance is not FDA approved in patients less than 79  years old. Performed at [facility], [address], [place], [place] [address]      Time coordinating discharge: Over 30 minutes  SIGNED:   Sidney Ace, MD  [facility] 10/30/2021, 9:24 AM Pager   If 7PM-7AM, please contact night-coverage

## 2021-10-30 NOTE — TOC Progression Note (Signed)
Called and left a Secure VM for Stacy Grimes at [facility] requesting a room number, sent the DC summay and orders thru the [employer]

## 2021-10-30 NOTE — Plan of Care (Signed)
Patient discharged per MD orders at this time.All discharge instructions,education & medications reviewed with the patient.Pt expressed understanding and will comply with dc instructions.follow up appointments was also communicated to the patient.no verbal c/o or any ssx of distress.Pt was discharged to the [facility] facility in [place].report was called to staff nurse Gerald Stabs before transport.Pt was transported by 2 [facility] personnel on a stretcher.

## 2021-10-31 DIAGNOSIS — I509 Heart failure, unspecified: Secondary | ICD-10-CM | POA: Diagnosis not present

## 2021-10-31 DIAGNOSIS — I4891 Unspecified atrial fibrillation: Secondary | ICD-10-CM | POA: Diagnosis not present

## 2021-10-31 DIAGNOSIS — R5381 Other malaise: Secondary | ICD-10-CM | POA: Diagnosis not present

## 2021-10-31 DIAGNOSIS — S7291XA Unspecified fracture of right femur, initial encounter for closed fracture: Secondary | ICD-10-CM | POA: Diagnosis not present

## 2021-11-06 DIAGNOSIS — S7291XA Unspecified fracture of right femur, initial encounter for closed fracture: Secondary | ICD-10-CM | POA: Diagnosis not present

## 2021-11-06 DIAGNOSIS — F0390 Unspecified dementia without behavioral disturbance: Secondary | ICD-10-CM | POA: Diagnosis not present

## 2021-11-06 DIAGNOSIS — I509 Heart failure, unspecified: Secondary | ICD-10-CM | POA: Diagnosis not present

## 2021-11-06 DIAGNOSIS — R5381 Other malaise: Secondary | ICD-10-CM | POA: Diagnosis not present

## 2021-11-26 ENCOUNTER — Ambulatory Visit (INDEPENDENT_AMBULATORY_CARE_PROVIDER_SITE_OTHER): Payer: Medicare HMO

## 2021-11-26 DIAGNOSIS — I5022 Chronic systolic (congestive) heart failure: Secondary | ICD-10-CM

## 2021-11-26 DIAGNOSIS — Z95810 Presence of automatic (implantable) cardiac defibrillator: Secondary | ICD-10-CM

## 2021-11-26 NOTE — Progress Notes (Signed)
EPIC Encounter for ICM Monitoring  Patient Name: Stacy Grimes is a 71 y.o. female Date: 11/26/2021 Primary Care Physican: Cyndi Bender, PA-C Primary Cardiologist: Fletcher Anon Electrophysiologist: Vergie Living Pacing:  97%  10/12/2020 Office Weight: 184 lbs   AT/AF Burden 1.1% (taking Aspirin & Plavix)   Spoke with Stacy Grimes and heart failure questions reviewed.  Pt was discharged from rehab after fall and breaking hip.  He sometimes has difficulty getting her drink enough fluids and dryness started in rehab.    Coruve thoracic impedance suggesting dryness starting 10/2.   Prescribed:  Furosemide 20 mg Take 1 tablet (20 mg total) by mouth every other day.  May take an additional tablet AS NEEDED for weight gain of 3 lbs overnight or 5 lbs in one week. Do not exceed more than 3 additional doses.   Potassium 20 mEq take 1 tablet daily    Labs: 07/10/2021 Creatinine 0.70, BUN 11, Potassium 3.2, Sodium 141, GFR >60 A complete set of results can be found in Results Review.   Recommendations:  Advised to skip Furosemide tablet on 10/25 and resume every other day on scheduled 10/27.     Follow-up plan: ICM clinic phone appointment on 12/03/2021 to recheck fluid levels.  91 day device clinic remote transmission scheduled 01/06/2022.     EP/Cardiology Office Visits:  01/17/2022 with Dr Fletcher Anon.   01/01/2022 with Dr Caryl Comes.   Last OV with Dr Caryl Comes was 07/16/2017.    Copy of ICM check sent to Dr. Caryl Comes.    3 month ICM trend: 11/26/2021.    12-14 Month ICM trend:     [name], RN 11/26/2021 8:20 AM

## 2021-12-03 ENCOUNTER — Ambulatory Visit (INDEPENDENT_AMBULATORY_CARE_PROVIDER_SITE_OTHER): Payer: Medicare HMO

## 2021-12-03 DIAGNOSIS — I5022 Chronic systolic (congestive) heart failure: Secondary | ICD-10-CM

## 2021-12-03 DIAGNOSIS — Z95810 Presence of automatic (implantable) cardiac defibrillator: Secondary | ICD-10-CM

## 2021-12-04 ENCOUNTER — Telehealth: Payer: Self-pay

## 2021-12-04 ENCOUNTER — Telehealth: Payer: Self-pay | Admitting: Cardiovascular Disease

## 2021-12-04 NOTE — Telephone Encounter (Signed)
Patients husband reports she is negative for COVID and they suspect it is a stomach bug. Reviewed that we do not prescribe paxlovid and he was understanding. He was very concerned about her being sick and will continue to monitor her. Advised if we can be of any further assistance to give [place] a call back.

## 2021-12-04 NOTE — Telephone Encounter (Signed)
Remote ICM transmission received.  Attempted call to patient regarding ICM remote transmission and left message to return call   

## 2021-12-04 NOTE — Progress Notes (Signed)
EPIC Encounter for ICM Monitoring  Patient Name: Stacy Grimes is a 71 y.o. female Date: 12/04/2021 Primary Care Physican: Cyndi Bender, PA-C Primary Cardiologist: Fletcher Anon Electrophysiologist: Vergie Living Pacing:  97%  10/12/2020 Office Weight: 184 lbs   AT/AF Burden 1.1% (taking Aspirin & Plavix)   Attempted call to husband per DPR and unable to reach.  Left message to return call. Transmission reviewed.    Coruve thoracic impedance suggesting possible dryness continues since 10/2.   Prescribed:  Furosemide 20 mg Take 1 tablet (20 mg total) by mouth every other day.  May take an additional tablet AS NEEDED for weight gain of 3 lbs overnight or 5 lbs in one week. Do not exceed more than 3 additional doses.   Potassium 20 mEq take 1 tablet daily    Labs: 07/10/2021 Creatinine 0.70, BUN 11, Potassium 3.2, Sodium 141, GFR >60 A complete set of results can be found in Results Review.   Recommendations:  Unable to reach.  Copy sent to Dr Caryl Comes and Alvis Lemmings, RN for review of possible dryness.     Follow-up plan: ICM clinic phone appointment on 01/07/2022.  91 day device clinic remote transmission scheduled 01/06/2022.     EP/Cardiology Office Visits:  01/17/2022 with Dr Fletcher Anon.   01/01/2022 with Dr Caryl Comes.   Last OV with Dr Caryl Comes was 07/16/2017.    Copy of ICM check sent to Dr. Caryl Comes.     3 month ICM trend: 12/03/2021.    12-14 Month ICM trend:     [name], RN 12/04/2021 7:36 AM

## 2021-12-04 NOTE — Telephone Encounter (Signed)
Pt c/o medication issue:  1. Name of Medication: paxlovid  2. How are you currently taking this medication (dosage and times per day)?   3. Are you having a reaction (difficulty breathing--STAT)?   4. What is your medication issue? Pt's husband is requesting a call back to see if this medication can be called in for this patient. He states she is having what they call "low covid". He says that she is staying sick with diarrhea and other issues. He says she is also dihydrated and the last time he had these issues when he had it, this is what was called in for him and it did help. He states her PCP will not call it in, and he fears that if she doesn't get this medication he may have to call the EMT to come and get her. Requesting call back.

## 2021-12-04 NOTE — Progress Notes (Signed)
Spoke with husband and heart failure questions reviewed.  Transmission results reviewed.   He reports patient is not doing well.  She had covid in rehab and then the medication was not sent home with her so he thinks she still has COVID.  Pt is having nausea, diarrhea, unable to eat and drinking very little.  He has held her Furosemide for the past few days.   He called PCP about ordering COVID medication and PCP did not order it.  He asked if Dr Caryl Comes would order and I explained that Dr Caryl Comes does not order Covid medication.  Advised to take pt to the ER to have her evaluated since she has several issues at this time.   He will call 911 if needed.  Advised to resume Furosemide if patient's GI issues resolve.

## 2021-12-10 ENCOUNTER — Other Ambulatory Visit: Payer: Self-pay

## 2021-12-10 ENCOUNTER — Emergency Department: Payer: Medicare HMO

## 2021-12-10 ENCOUNTER — Inpatient Hospital Stay
Admission: EM | Admit: 2021-12-10 | Discharge: 2021-12-13 | DRG: 533 | Disposition: A | Discharge status: Home Health | Payer: Medicare HMO | Attending: Internal Medicine | Admitting: Internal Medicine

## 2021-12-10 DIAGNOSIS — I420 Dilated cardiomyopathy: Secondary | ICD-10-CM | POA: Diagnosis not present

## 2021-12-10 DIAGNOSIS — Z9181 History of falling: Secondary | ICD-10-CM

## 2021-12-10 DIAGNOSIS — F0390 Unspecified dementia without behavioral disturbance: Secondary | ICD-10-CM

## 2021-12-10 DIAGNOSIS — F0394 Unspecified dementia, unspecified severity, with anxiety: Secondary | ICD-10-CM | POA: Diagnosis present

## 2021-12-10 DIAGNOSIS — Z79890 Hormone replacement therapy (postmenopausal): Secondary | ICD-10-CM

## 2021-12-10 DIAGNOSIS — W010XXA Fall on same level from slipping, tripping and stumbling without subsequent striking against object, initial encounter: Secondary | ICD-10-CM | POA: Diagnosis present

## 2021-12-10 DIAGNOSIS — Z8249 Family history of ischemic heart disease and other diseases of the circulatory system: Secondary | ICD-10-CM | POA: Diagnosis not present

## 2021-12-10 DIAGNOSIS — Z7401 Bed confinement status: Secondary | ICD-10-CM | POA: Diagnosis not present

## 2021-12-10 DIAGNOSIS — M8580 Other specified disorders of bone density and structure, unspecified site: Secondary | ICD-10-CM | POA: Diagnosis present

## 2021-12-10 DIAGNOSIS — U071 COVID-19: Secondary | ICD-10-CM | POA: Diagnosis not present

## 2021-12-10 DIAGNOSIS — Z95810 Presence of automatic (implantable) cardiac defibrillator: Secondary | ICD-10-CM

## 2021-12-10 DIAGNOSIS — E1165 Type 2 diabetes mellitus with hyperglycemia: Secondary | ICD-10-CM | POA: Diagnosis present

## 2021-12-10 DIAGNOSIS — I110 Hypertensive heart disease with heart failure: Secondary | ICD-10-CM | POA: Diagnosis present

## 2021-12-10 DIAGNOSIS — Z7984 Long term (current) use of oral hypoglycemic drugs: Secondary | ICD-10-CM

## 2021-12-10 DIAGNOSIS — I2510 Atherosclerotic heart disease of native coronary artery without angina pectoris: Secondary | ICD-10-CM | POA: Diagnosis present

## 2021-12-10 DIAGNOSIS — I5022 Chronic systolic (congestive) heart failure: Secondary | ICD-10-CM | POA: Diagnosis present

## 2021-12-10 DIAGNOSIS — G4733 Obstructive sleep apnea (adult) (pediatric): Secondary | ICD-10-CM | POA: Diagnosis present

## 2021-12-10 DIAGNOSIS — F32A Depression, unspecified: Secondary | ICD-10-CM | POA: Diagnosis present

## 2021-12-10 DIAGNOSIS — F03918 Unspecified dementia, unspecified severity, with other behavioral disturbance: Secondary | ICD-10-CM

## 2021-12-10 DIAGNOSIS — E039 Hypothyroidism, unspecified: Secondary | ICD-10-CM | POA: Diagnosis present

## 2021-12-10 DIAGNOSIS — Z8673 Personal history of transient ischemic attack (TIA), and cerebral infarction without residual deficits: Secondary | ICD-10-CM | POA: Diagnosis not present

## 2021-12-10 DIAGNOSIS — S72491A Other fracture of lower end of right femur, initial encounter for closed fracture: Secondary | ICD-10-CM | POA: Diagnosis not present

## 2021-12-10 DIAGNOSIS — N179 Acute kidney failure, unspecified: Secondary | ICD-10-CM | POA: Diagnosis present

## 2021-12-10 DIAGNOSIS — Z90710 Acquired absence of both cervix and uterus: Secondary | ICD-10-CM

## 2021-12-10 DIAGNOSIS — Z6831 Body mass index (BMI) 31.0-31.9, adult: Secondary | ICD-10-CM

## 2021-12-10 DIAGNOSIS — M4312 Spondylolisthesis, cervical region: Secondary | ICD-10-CM | POA: Diagnosis not present

## 2021-12-10 DIAGNOSIS — Y92009 Unspecified place in unspecified non-institutional (private) residence as the place of occurrence of the external cause: Secondary | ICD-10-CM

## 2021-12-10 DIAGNOSIS — E669 Obesity, unspecified: Secondary | ICD-10-CM | POA: Diagnosis present

## 2021-12-10 DIAGNOSIS — Z7902 Long term (current) use of antithrombotics/antiplatelets: Secondary | ICD-10-CM

## 2021-12-10 DIAGNOSIS — W19XXXA Unspecified fall, initial encounter: Secondary | ICD-10-CM | POA: Diagnosis not present

## 2021-12-10 DIAGNOSIS — Z823 Family history of stroke: Secondary | ICD-10-CM

## 2021-12-10 DIAGNOSIS — Z043 Encounter for examination and observation following other accident: Secondary | ICD-10-CM | POA: Diagnosis not present

## 2021-12-10 DIAGNOSIS — Z7951 Long term (current) use of inhaled steroids: Secondary | ICD-10-CM

## 2021-12-10 DIAGNOSIS — K219 Gastro-esophageal reflux disease without esophagitis: Secondary | ICD-10-CM | POA: Diagnosis present

## 2021-12-10 DIAGNOSIS — S72401A Unspecified fracture of lower end of right femur, initial encounter for closed fracture: Secondary | ICD-10-CM

## 2021-12-10 DIAGNOSIS — M79661 Pain in right lower leg: Secondary | ICD-10-CM | POA: Diagnosis not present

## 2021-12-10 DIAGNOSIS — R609 Edema, unspecified: Secondary | ICD-10-CM | POA: Diagnosis not present

## 2021-12-10 DIAGNOSIS — F0393 Unspecified dementia, unspecified severity, with mood disturbance: Secondary | ICD-10-CM | POA: Diagnosis present

## 2021-12-10 DIAGNOSIS — H9192 Unspecified hearing loss, left ear: Secondary | ICD-10-CM | POA: Diagnosis present

## 2021-12-10 DIAGNOSIS — I959 Hypotension, unspecified: Secondary | ICD-10-CM | POA: Diagnosis not present

## 2021-12-10 DIAGNOSIS — Z9981 Dependence on supplemental oxygen: Secondary | ICD-10-CM | POA: Diagnosis not present

## 2021-12-10 DIAGNOSIS — J449 Chronic obstructive pulmonary disease, unspecified: Secondary | ICD-10-CM | POA: Diagnosis present

## 2021-12-10 DIAGNOSIS — Z9842 Cataract extraction status, left eye: Secondary | ICD-10-CM

## 2021-12-10 DIAGNOSIS — Z9841 Cataract extraction status, right eye: Secondary | ICD-10-CM

## 2021-12-10 DIAGNOSIS — S72414A Nondisplaced unspecified condyle fracture of lower end of right femur, initial encounter for closed fracture: Secondary | ICD-10-CM | POA: Diagnosis not present

## 2021-12-10 DIAGNOSIS — Z87891 Personal history of nicotine dependence: Secondary | ICD-10-CM | POA: Diagnosis not present

## 2021-12-10 DIAGNOSIS — Z961 Presence of intraocular lens: Secondary | ICD-10-CM | POA: Diagnosis present

## 2021-12-10 DIAGNOSIS — Z885 Allergy status to narcotic agent status: Secondary | ICD-10-CM

## 2021-12-10 DIAGNOSIS — Z8679 Personal history of other diseases of the circulatory system: Secondary | ICD-10-CM

## 2021-12-10 DIAGNOSIS — Z91041 Radiographic dye allergy status: Secondary | ICD-10-CM

## 2021-12-10 DIAGNOSIS — I10 Essential (primary) hypertension: Secondary | ICD-10-CM | POA: Diagnosis not present

## 2021-12-10 DIAGNOSIS — M25561 Pain in right knee: Secondary | ICD-10-CM

## 2021-12-10 DIAGNOSIS — R102 Pelvic and perineal pain: Secondary | ICD-10-CM | POA: Diagnosis not present

## 2021-12-10 DIAGNOSIS — S7290XA Unspecified fracture of unspecified femur, initial encounter for closed fracture: Secondary | ICD-10-CM | POA: Diagnosis present

## 2021-12-10 DIAGNOSIS — R079 Chest pain, unspecified: Secondary | ICD-10-CM | POA: Diagnosis not present

## 2021-12-10 DIAGNOSIS — S72001A Fracture of unspecified part of neck of right femur, initial encounter for closed fracture: Secondary | ICD-10-CM | POA: Diagnosis not present

## 2021-12-10 DIAGNOSIS — Z79899 Other long term (current) drug therapy: Secondary | ICD-10-CM

## 2021-12-10 DIAGNOSIS — Z9049 Acquired absence of other specified parts of digestive tract: Secondary | ICD-10-CM

## 2021-12-10 LAB — COMPREHENSIVE METABOLIC PANEL
ALT: 11 U/L (ref 0–44)
AST: 19 U/L (ref 15–41)
Albumin: 3.1 g/dL — ABNORMAL LOW (ref 3.5–5.0)
Alkaline Phosphatase: 176 U/L — ABNORMAL HIGH (ref 38–126)
Anion gap: 8 (ref 5–15)
BUN: 18 mg/dL (ref 8–23)
CO2: 23 mmol/L (ref 22–32)
Calcium: 8.1 mg/dL — ABNORMAL LOW (ref 8.9–10.3)
Chloride: 108 mmol/L (ref 98–111)
Creatinine, Ser: 1.39 mg/dL — ABNORMAL HIGH (ref 0.44–1.00)
GFR, Estimated: 40 mL/min — ABNORMAL LOW (ref >60)
Glucose, Bld: 121 mg/dL — ABNORMAL HIGH (ref 70–99)
Potassium: 5 mmol/L (ref 3.5–5.1)
Sodium: 139 mmol/L (ref 135–145)
Total Bilirubin: 0.8 mg/dL (ref 0.3–1.2)
Total Protein: 7.5 g/dL (ref 6.5–8.1)

## 2021-12-10 LAB — URINALYSIS, ROUTINE W REFLEX MICROSCOPIC
Bilirubin Urine: NEGATIVE
Glucose, UA: NEGATIVE mg/dL
Hgb urine dipstick: NEGATIVE
Ketones, ur: NEGATIVE mg/dL
Leukocytes,Ua: NEGATIVE
Nitrite: NEGATIVE
Protein, ur: NEGATIVE mg/dL
Specific Gravity, Urine: 1.014 (ref 1.005–1.030)
pH: 5 (ref 5.0–8.0)

## 2021-12-10 LAB — CBC WITH DIFFERENTIAL/PLATELET
Abs Immature Granulocytes: 0.05 10*3/uL (ref 0.00–0.07)
Basophils Absolute: 0 10*3/uL (ref 0.0–0.1)
Basophils Relative: 0 %
Eosinophils Absolute: 0.1 10*3/uL (ref 0.0–0.5)
Eosinophils Relative: 1 %
HCT: 36 % (ref 36.0–46.0)
Hemoglobin: 10.9 g/dL — ABNORMAL LOW (ref 12.0–15.0)
Immature Granulocytes: 1 %
Lymphocytes Relative: 10 %
Lymphs Abs: 0.9 10*3/uL (ref 0.7–4.0)
MCH: 25.3 pg — ABNORMAL LOW (ref 26.0–34.0)
MCHC: 30.3 g/dL (ref 30.0–36.0)
MCV: 83.7 fL (ref 80.0–100.0)
Monocytes Absolute: 1.1 10*3/uL — ABNORMAL HIGH (ref 0.1–1.0)
Monocytes Relative: 12 %
Neutro Abs: 7 10*3/uL (ref 1.7–7.7)
Neutrophils Relative %: 76 %
Platelets: 175 10*3/uL (ref 150–400)
RBC: 4.3 MIL/uL (ref 3.87–5.11)
RDW: 23.9 % — ABNORMAL HIGH (ref 11.5–15.5)
Smear Review: NORMAL
WBC: 9.3 10*3/uL (ref 4.0–10.5)
nRBC: 0 % (ref 0.0–0.2)

## 2021-12-10 LAB — BRAIN NATRIURETIC PEPTIDE: B Natriuretic Peptide: 86.6 pg/mL (ref 0.0–100.0)

## 2021-12-10 LAB — SARS CORONAVIRUS 2 BY RT PCR: SARS Coronavirus 2 by RT PCR: POSITIVE — AB

## 2021-12-10 LAB — TROPONIN I (HIGH SENSITIVITY): Troponin I (High Sensitivity): 23 ng/L — ABNORMAL HIGH (ref <18)

## 2021-12-10 MED ORDER — ALBUTEROL SULFATE HFA 108 (90 BASE) MCG/ACT IN AERS: 1.0000 | INHALATION_SPRAY | Freq: Four times a day (QID) | RESPIRATORY_TRACT | Status: DC | PRN

## 2021-12-10 MED ORDER — FLUTICASONE FUROATE-VILANTEROL 200-25 MCG/ACT IN AEPB
1.0000 | INHALATION_SPRAY | Freq: Every day | RESPIRATORY_TRACT | Status: DC
  Administered 2021-12-11: 1 via RESPIRATORY_TRACT
  Filled 2021-12-10: qty 28

## 2021-12-10 MED ORDER — SODIUM CHLORIDE 0.9 % IV SOLN: INTRAVENOUS | Status: DC

## 2021-12-10 MED ORDER — ACETAMINOPHEN 325 MG PO TABS: 650.0000 mg | ORAL_TABLET | Freq: Four times a day (QID) | ORAL | Status: DC | PRN

## 2021-12-10 MED ORDER — SODIUM CHLORIDE 0.9 % IV SOLN: INTRAVENOUS | Status: AC

## 2021-12-10 MED ORDER — ACETAMINOPHEN 500 MG PO TABS
1000.0000 mg | ORAL_TABLET | Freq: Once | ORAL | Status: AC
  Administered 2021-12-10: 1000 mg via ORAL
  Filled 2021-12-10: qty 2

## 2021-12-10 MED ORDER — MIRTAZAPINE 15 MG PO TABS
7.5000 mg | ORAL_TABLET | Freq: Two times a day (BID) | ORAL | Status: DC
  Administered 2021-12-10 – 2021-12-12 (×3): 7.5 mg via ORAL
  Filled 2021-12-10 (×5): qty 1

## 2021-12-10 MED ORDER — MEMANTINE HCL 5 MG PO TABS
10.0000 mg | ORAL_TABLET | Freq: Two times a day (BID) | ORAL | Status: DC
  Administered 2021-12-10 – 2021-12-12 (×3): 10 mg via ORAL
  Filled 2021-12-10 (×5): qty 2

## 2021-12-10 MED ORDER — HEPARIN SODIUM (PORCINE) 5000 UNIT/ML IJ SOLN
5000.0000 [IU] | Freq: Three times a day (TID) | INTRAMUSCULAR | Status: AC
  Administered 2021-12-10: 5000 [IU] via SUBCUTANEOUS
  Filled 2021-12-10: qty 1

## 2021-12-10 MED ORDER — ALBUTEROL SULFATE 0.63 MG/3ML IN NEBU: 1.0000 | INHALATION_SOLUTION | Freq: Four times a day (QID) | RESPIRATORY_TRACT | Status: DC | PRN

## 2021-12-10 MED ORDER — GABAPENTIN 400 MG PO CAPS
400.0000 mg | ORAL_CAPSULE | Freq: Three times a day (TID) | ORAL | Status: DC
  Administered 2021-12-10 – 2021-12-12 (×4): 400 mg via ORAL
  Filled 2021-12-10 (×6): qty 1

## 2021-12-10 MED ORDER — LEVOTHYROXINE SODIUM 112 MCG PO TABS
112.0000 ug | ORAL_TABLET | Freq: Every day | ORAL | Status: DC
  Administered 2021-12-11 – 2021-12-13 (×2): 112 ug via ORAL
  Filled 2021-12-10 (×3): qty 1

## 2021-12-10 MED ORDER — ACETAMINOPHEN 650 MG RE SUPP: 650.0000 mg | Freq: Four times a day (QID) | RECTAL | Status: DC | PRN

## 2021-12-10 MED ORDER — CLOPIDOGREL BISULFATE 75 MG PO TABS
75.0000 mg | ORAL_TABLET | Freq: Every day | ORAL | Status: DC
  Administered 2021-12-11 – 2021-12-12 (×2): 75 mg via ORAL
  Filled 2021-12-10 (×2): qty 1

## 2021-12-10 MED ORDER — ALLOPURINOL 300 MG PO TABS
300.0000 mg | ORAL_TABLET | Freq: Every day | ORAL | Status: DC
  Administered 2021-12-11 – 2021-12-12 (×2): 300 mg via ORAL
  Filled 2021-12-10 (×2): qty 1

## 2021-12-10 MED ORDER — SODIUM CHLORIDE 0.9% FLUSH
3.0000 mL | Freq: Two times a day (BID) | INTRAVENOUS | Status: DC
  Administered 2021-12-10 – 2021-12-11 (×2): 3 mL via INTRAVENOUS

## 2021-12-10 MED ORDER — DONEPEZIL HCL 5 MG PO TABS
10.0000 mg | ORAL_TABLET | Freq: Every day | ORAL | Status: DC
  Administered 2021-12-10: 10 mg via ORAL
  Filled 2021-12-10 (×3): qty 2

## 2021-12-10 MED ORDER — ATORVASTATIN CALCIUM 20 MG PO TABS
80.0000 mg | ORAL_TABLET | Freq: Every day | ORAL | Status: DC
  Administered 2021-12-11 – 2021-12-12 (×2): 80 mg via ORAL
  Filled 2021-12-10 (×2): qty 4

## 2021-12-10 MED ORDER — VENLAFAXINE HCL ER 75 MG PO CP24
75.0000 mg | ORAL_CAPSULE | Freq: Two times a day (BID) | ORAL | Status: DC
  Administered 2021-12-10 – 2021-12-12 (×3): 75 mg via ORAL
  Filled 2021-12-10 (×8): qty 1

## 2021-12-10 MED ORDER — ADULT MULTIVITAMIN W/MINERALS CH
1.0000 | ORAL_TABLET | Freq: Every day | ORAL | Status: DC
  Administered 2021-12-11 – 2021-12-12 (×2): 1 via ORAL
  Filled 2021-12-10 (×3): qty 1

## 2021-12-10 MED ORDER — MORPHINE SULFATE (PF) 2 MG/ML IV SOLN
2.0000 mg | INTRAVENOUS | Status: DC | PRN
  Administered 2021-12-10 – 2021-12-11 (×3): 2 mg via INTRAVENOUS
  Filled 2021-12-10 (×5): qty 1

## 2021-12-10 MED ORDER — METOPROLOL SUCCINATE ER 25 MG PO TB24
25.0000 mg | ORAL_TABLET | Freq: Every day | ORAL | Status: DC
  Administered 2021-12-11 – 2021-12-12 (×2): 25 mg via ORAL
  Filled 2021-12-10 (×2): qty 1

## 2021-12-10 MED ORDER — INSULIN ASPART 100 UNIT/ML IJ SOLN: 0.0000 [IU] | Freq: Three times a day (TID) | INTRAMUSCULAR | Status: DC

## 2021-12-10 MED ORDER — DIGOXIN 125 MCG PO TABS
0.1250 mg | ORAL_TABLET | ORAL | Status: DC
  Filled 2021-12-10 (×2): qty 1

## 2021-12-10 MED ORDER — MORPHINE SULFATE (PF) 2 MG/ML IV SOLN
2.0000 mg | Freq: Once | INTRAVENOUS | Status: AC
  Administered 2021-12-10: 2 mg via INTRAVENOUS
  Filled 2021-12-10: qty 1

## 2021-12-10 MED ORDER — POLYETHYLENE GLYCOL 3350 17 G PO PACK: 17.0000 g | PACK | Freq: Every day | ORAL | Status: DC | PRN

## 2021-12-10 NOTE — Assessment & Plan Note (Signed)
In the setting of poor p.o. intake and decreased appetite after recent COVID-19 infection.  Patient was taking furosemide but had it recently discontinued due to this.  - Hold home nephrotoxic agents - Gentle IV fluid resuscitation - Repeat BMP in the morning

## 2021-12-10 NOTE — ED Provider Notes (Signed)
Patient care assumed at 3 PM. Briefly, 71 yo F here with mechanical fall and R knee pain. Imaging shows nondisplaced transverse fx along inferior IM nail from recent hip fx. Dr. Roland Rack, who performed initial surgery, is on call and was consulted. Will admit to Hospitalist, plan for NPO in AM. Pt updated and in agreement.   Duffy Bruce, MD 12/10/21 1807

## 2021-12-10 NOTE — Assessment & Plan Note (Signed)
-   SSI, moderate - Holding home metformin

## 2021-12-10 NOTE — Assessment & Plan Note (Addendum)
Euvolemic at this time. - Holding home furosemide, losartan in the setting of AKI - Continue home digoxin 3 times weekly, and daily metoprolol

## 2021-12-10 NOTE — ED Notes (Addendum)
Admitting MD at bedside for evaluation.   Pt with continued confusion, alert to self only at this time.

## 2021-12-10 NOTE — Assessment & Plan Note (Signed)
On discussion with patient's husband, Stacy Grimes contracted COVID-19 while at SNF, at which she was discharged on 10/15.  Per chart review, Stacy Grimes contacted patient's cardiologist on 10/30 stating patient is still not feeling well and is experiencing upset stomach with poor p.o. intake.  Patient is outside of the 10-day quarantine window and positive test is in the setting of recent infection.  No indication at this time for antivirals.  - Discontinue airborne precautions

## 2021-12-10 NOTE — Assessment & Plan Note (Signed)
Per chart review, patient has a history of dementia for which she is currently on donepezil and memantine.  She is only oriented to person at this time, which is patient's husband states is her baseline. - Continue donepezil and memantine - Delirium precautions

## 2021-12-10 NOTE — ED Provider Notes (Signed)
[facility] Provider Note    Event Date/Time   First MD Initiated Contact with Patient 12/10/21 1240     (approximate)   History   Fall and Leg Pain   HPI  Stacy Grimes is a 71 y.o. 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  Patient seems to be somewhat unsure of what exactly happened in the timing of it.  She says she may be fell this morning.  She endorses pain in the right leg from hip down.  Tells me she has been able to ambulate on it since.  Denies numbness or weakness.     Past Medical History:  Diagnosis Date   AAA (abdominal aortic aneurysm) (HCC)    Anginal pain ([place])    Anxiety    Arthritis    Automatic implantable cardioverter-defibrillator in situ    Biventricular ICD  [employer]    COPD (chronic obstructive pulmonary disease) (HCC)    Deafness    left ear   Dementia (HCC)    Depression    Dizziness    Dysrhythmia    Fracture    history of spinal fracture   GERD (gastroesophageal reflux disease)    HFrEF (heart failure with reduced ejection fraction) ([place])    a. 2010 Cath: nonobs dzs, EF 15-20%; b. 09/2017 Echo: EF suboptimal. EF low nl; c. 04/2018 Echo: EF 40-45%.   Hypertension    Hypothyroidism    Non-ischemic cardiomyopathy ([place])    a. 2010 Cath: nonobs dzs, EF 15-20%; b. 2011 s/p SJM CRT-D; c. 09/2016 s/p Gen change; d. 09/2017 Echo: EF suboptimal. EF low nl; e. 04/2018 Echo: EF 40-45%.   Oxygen dependent    Palpitations    Shortness of breath dyspnea    Sleep apnea    Wheezing     Patient Active Problem List   Diagnosis Date Noted   Shock circulatory ([place])    Hip fracture, unspecified laterality, closed, initial encounter ([place]) 10/23/2021   Hip fracture ([place]) 10/23/2021   CHF (congestive heart failure) ([place]) 10/23/2021   Orthostatic hypotension 08/06/2021   Chronic congestive heart failure  ([place])    Acute ischemic stroke ([place]) 06/21/2020   Chronic obstructive pulmonary disease, unspecified COPD type ([place]) 06/20/2020   Type 2 diabetes mellitus with hyperglycemia, unspecified whether long term insulin use ([place]) 06/20/2020   Left knee pain 12/16/2019   Elevated troponin I level 12/15/2019   Dehydration 04/12/2018   Syncope 04/09/2018   Benign neoplasm of colon 08/31/2012   Diverticulosis of colon (without mention of hemorrhage) 08/31/2012   Fatigue 08/10/2012   Personal history of colonic polyps 07/02/2012   Dysphagia, unspecified(787.20) 07/02/2012   Unspecified constipation 07/02/2012   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 07/09/2010   HYPERTENSION, BENIGN 03/24/2009   SLEEP APNEA, OBSTRUCTIVE 03/23/2009   Dilated cardiomyopathy ([place]) 03/23/2009   PALPITATIONS 03/23/2009   MURMUR 03/23/2009   COUGH 03/23/2009     Physical Exam  Triage Vital Signs: ED Triage Vitals  Enc Vitals Group     BP 12/10/21 1244 108/88     Pulse Rate 12/10/21 1244 88     Resp 12/10/21 1244 (!) 22     Temp 12/10/21 1244 98.5 F (36.9 C)     Temp Source 12/10/21 1244 Oral     SpO2 12/10/21 1244 98 %     Weight 12/10/21 1245 179  lb 0.2 oz (81.2 kg)     Height 12/10/21 1245 5\' 3"  (1.6 m)     Head Circumference --      Peak Flow --      Pain Score 12/10/21 1245 10     Pain Loc --      Pain Edu? --      Excl. in [place]? --     Most recent vital signs: Vitals:   12/10/21 1341 12/10/21 1355  BP: 113/60 118/64  Pulse: 96 88  Resp: (!) 23 (!) 21  Temp:    SpO2: 98% 96%     General: Awake, no distress.  Chronically ill-appearing CV:  Good peripheral perfusion.  Resp:  Normal effort.  Abd:  No distention.  Neuro:             Awake, Alert, Oriented x 3  Other:  Right leg is somewhat shortened compared to left, there is some swelling about the right femur and right knee which has effusion is very tender to palpation patient does not tolerate ranging she is  unable to straight leg raise No palpable pulses in either lower extremity but there is good cap refill both legs are warm  Patient able to plantarflex and dorsiflex with normal strength bilaterally   ED Results / Procedures / Treatments  Labs (all labs ordered are listed, but only abnormal results are displayed) Labs Reviewed  COMPREHENSIVE METABOLIC PANEL - Abnormal; Notable for the following components:      Result Value   Glucose, Bld 121 (*)    Creatinine, Ser 1.39 (*)    Calcium 8.1 (*)    Albumin 3.1 (*)    Alkaline Phosphatase 176 (*)    GFR, Estimated 40 (*)    All other components within normal limits  CBC WITH DIFFERENTIAL/PLATELET - Abnormal; Notable for the following components:   Hemoglobin 10.9 (*)    MCH 25.3 (*)    RDW 23.9 (*)    Monocytes Absolute 1.1 (*)    All other components within normal limits     EKG     RADIOLOGY I reviewed and interpreted the x-ray of the right knee which shows no acute findings, severe degenerative changes   PROCEDURES:  Critical Care performed: No  Procedures    MEDICATIONS ORDERED IN ED: Medications  acetaminophen (TYLENOL) tablet 1,000 mg (1,000 mg Oral Given 12/10/21 1302)  morphine (PF) 2 MG/ML injection 2 mg (2 mg Intravenous Given 12/10/21 1334)     IMPRESSION / MDM / [place] / ED COURSE  I reviewed the triage vital signs and the nursing notes.                              Patient's presentation is most consistent with acute complicated illness / injury requiring diagnostic workup.  Differential diagnosis includes, but is not limited to, hip fracture, femur fracture, ligamentous knee injury, tibial plateau fracture, patella fracture, patella or quadriceps tendon rupture  Patient is a 71 year old female with multiple comorbidities presents after a fall.  Patient somewhat unclear about the events that preceded her come to the ED but per EMS she had a fall going to the commode last night.   Complaining of right leg pain.  On exam she does have swelling about the right knee she is very tender throughout palpation of the right hip right thigh and right knee she is not able to straight leg raise but effort  is also limited.  She is neurovascular intact.  Does not tolerate ranging the hip much at all and the leg does look somewhat shortened to me.  Plan to obtain x-rays of the hip femur knee tib-fib.  If not revealing may need to have advanced imaging.  We will get more information from patient's husband.    X-rays of the right hip femur knee and tib-fib did not show any acute fracture.  On reassessment pain really does localize to the right knee which is swollen.  She is really not able to me range it at all.  Unable to assess where she can truly straight leg raise or not.  Performed bedside ultrasound of quads and patellar tendons and they do appear intact I do not feel a palpable defect.  We will obtain a CT a to rule out occult tibial plateau fracture or other fracture.  Will also obtain CT head and C-spine given unclear nature of the fall.  Patient's husband tells me she fell off the commode yesterday and has not been able to ambulate since.  Given patient's degree of pain I am not surprised that she is not able to ambulate.  She was using a walker since having her hip replaced I do think this will be a big barrier to her going home and she will likely need admission.   FINAL CLINICAL IMPRESSION(S) / ED DIAGNOSES   Final diagnoses:  Fall, initial encounter  Acute pain of right knee     Rx / DC Orders   ED Discharge Orders     None        Note:  This document was prepared using Dragon voice recognition software and may include unintentional dictation errors.   Rada Hay, MD 12/10/21 1459

## 2021-12-10 NOTE — Assessment & Plan Note (Addendum)
Patient presenting after a ground-level fall with CT imaging remarkable for acute transverse fracture of the distal femoral meta physis guarding the inferior margin of the IM nail.  Initially, there was concern patient would need to go back to the OR tomorrow.  I discussed with Dr. Roland Rack, who is currently recommending nonsurgical management.  She will be fitted with a brace tomorrow.    - Orthopedic surgery following; appreciate their recommendations - Brace placement on 11/7 - PT/OT once cleared by orthopedic surgery.  Of note, patient's husband declined SNF placement if that is recommended.  She already has home health set up. - Tylenol and morphine for pain management

## 2021-12-10 NOTE — ED Triage Notes (Signed)
BIBA from home for fall that happened yesterday while standing to go to bedside commode. Pt with recent right sided hip surgery (9/28). Arrives with pain to right side buttock, hip, knee and ankle.   Pt with shortening to right leg. +pulse+ sensation noted.

## 2021-12-10 NOTE — H&P (Signed)
History and Physical    Patient: Stacy Grimes ATF:[id number] DOB: Jun 30, 1950 DOA: 12/10/2021 DOS: the patient was seen and examined on 12/10/2021 PCP: Cyndi Bender, PA-C  Patient coming from: Home  Chief Complaint:  Chief Complaint  Patient presents with   Fall   Leg Pain   HPI: Stacy Grimes is a 71 y.o. 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 Due to patient's underlying dementia, history obtained from patient's husband via telephone.   Mr. Valentino Saxon states patient was in her normal state of health yesterday morning, when she got up to use her bedside commode.  When she stood up, her right knee buckled and she lost her balance, falling forward onto both knees.  Due to right knee pain, she was unable to stand up on her own and Mr. Fogelman helped her to the bed.  He gradually noticed worsening right knee swelling and significant pain with any palpation of the affected area.  Due to this, he brought her to the ED.  At the time of the fall, he denies any loss of consciousness and denies [name] endorsing any chest pain, palpitations, or shortness of breath.  ED Course:  On arrival to the ED, patient was afebrile at 98.5 with blood pressure of 122/51 and heart rate of 93.  She was saturating at 96% on her home 3 L nasal cannula supplemental oxygen.Initial blood work demonstrates creatinine of 1.39 with potassium of 5.0, hemoglobin of 10.9 and troponin of 23.  Right femur, pelvis and tib-fib x-rays with no acute abnormalities noted.  CT of the right knee demonstrated acute transverse fracture of the distal femoral metaphysis skirting the inferior margin of the IM nail.  Orthopedic surgery was consulted.  [facility] contacted for admission.  Review of Systems: As mentioned in the history of present illness.  All other systems reviewed and are negative.  Past Medical History:  Diagnosis Date   AAA (abdominal aortic aneurysm) (HCC)    Anginal pain ([place])    Anxiety    Arthritis    Automatic implantable cardioverter-defibrillator in situ    Biventricular ICD  [place]    COPD (chronic obstructive pulmonary disease) (HCC)    Deafness    left ear   Dementia (HCC)    Depression    Dizziness    Dysrhythmia    Fracture    history of spinal fracture   GERD (gastroesophageal reflux disease)    HFrEF (heart failure with reduced ejection fraction) ([place])    a. 2010 Cath: nonobs dzs, EF 15-20%; b. 09/2017 Echo: EF suboptimal. EF low nl; c. 04/2018 Echo: EF 40-45%.   Hypertension    Hypothyroidism    Non-ischemic cardiomyopathy ([place])    a. 2010 Cath: nonobs dzs, EF 15-20%; b. 2011 s/p SJM CRT-D; c. 09/2016 s/p Gen change; d. 09/2017 Echo: EF suboptimal. EF low nl; e. 04/2018 Echo: EF 40-45%.   Oxygen dependent    Palpitations    Shortness of breath dyspnea    Sleep apnea    Wheezing    Past Surgical History:  Procedure Laterality Date   ABDOMINAL HYSTERECTOMY     BIV ICD GENERATOR CHANGEOUT N/A 09/17/2016   Procedure: BiV ICD Generator Changeout;  Surgeon: Deboraha Sprang, MD;  Location: [facility];  Service: Cardiovascular;  Laterality: N/A;  BLADDER SURGERY     CARDIAC CATHETERIZATION     CARDIAC DEFIBRILLATOR PLACEMENT  4 yrs ago   pacemaker   CATARACT EXTRACTION W/PHACO Left 09/01/2014   Procedure: CATARACT EXTRACTION PHACO AND INTRAOCULAR LENS PLACEMENT ([place]);  Surgeon: Lyla Glassing, MD;  Location: [facility];  Service: Ophthalmology;  Laterality: Left;  [place]: 1:12.1    CATARACT EXTRACTION W/PHACO Right 09/29/2014   Procedure: CATARACT EXTRACTION PHACO AND INTRAOCULAR LENS PLACEMENT ([facility]);  Surgeon: Lyla Glassing, MD;  Location: [facility];  Service: Ophthalmology;  Laterality: Right;  [place]: 00:52.7    CHOLECYSTECTOMY     COLON SURGERY     COLONOSCOPY     COLONOSCOPY N/A 08/31/2012    Procedure: COLONOSCOPY;  Surgeon: Inda Castle, MD;  Location: [facility];  Service: Endoscopy;  Laterality: N/A;   ESOPHAGOGASTRODUODENOSCOPY N/A 07/06/2012   Procedure: ESOPHAGOGASTRODUODENOSCOPY (EGD);  Surgeon: Lafayette Dragon, MD;  Location: [facility];  Service: Endoscopy;  Laterality: N/A;   INTRAMEDULLARY (IM) NAIL INTERTROCHANTERIC Right 10/23/2021   Procedure: INTRAMEDULLARY (IM) NAIL INTERTROCHANTERIC;  Surgeon: Corky Mull, MD;  Location: [facility];  Service: Orthopedics;  Laterality: Right;   KNEE SURGERY Right    MIDDLE EAR SURGERY     SAVORY DILATION N/A 07/06/2012   Procedure: SAVORY DILATION;  Surgeon: Lafayette Dragon, MD;  Location: [facility];  Service: Endoscopy;  Laterality: N/A;   TEE WITHOUT CARDIOVERSION N/A 06/23/2020   Procedure: TRANSESOPHAGEAL ECHOCARDIOGRAM (TEE);  Surgeon: Minna Merritts, MD;  Location: [facility];  Service: Cardiovascular;  Laterality: N/A;   WRIST SURGERY Right    Social History:  reports that she quit smoking about 19 years ago. Her smoking use included cigarettes. She has a 60.00 pack-year smoking history. She has never used smokeless tobacco. She reports that she does not drink alcohol and does not use drugs.  Allergies  Allergen Reactions   Codeine Palpitations   Contrast Media [Iodinated Contrast Media] Rash    Family History  Problem Relation Age of Onset   Hypertension Mother    Breast cancer Mother    Stroke Mother    Heart disease Father    Heart attack Sister    Hypertension Brother    Heart attack Sister    Colon cancer Brother 57    Prior to Admission medications   Medication Sig Start Date End Date Taking? Authorizing Provider  allopurinol (ZYLOPRIM) 100 MG tablet Take 300 mg by mouth daily.   Yes [provider]  atorvastatin (LIPITOR) 80 MG tablet Take 1 tablet (80 mg total) by mouth daily. 07/12/21 07/07/22 Yes Wellington Hampshire, MD  budesonide-formoterol (SYMBICORT) 160-4.5 MCG/ACT inhaler Inhale 2 puffs into  the lungs 2 (two) times daily.   Yes [provider]  clopidogrel (PLAVIX) 75 MG tablet Take 1 tablet (75 mg total) by mouth daily. 07/12/21  Yes Wellington Hampshire, MD  diclofenac Sodium (VOLTAREN) 1 % GEL Apply 2 g topically 4 (four) times daily. 11/15/21  Yes [provider]  digoxin (LANOXIN) 0.125 MG tablet TAKE 1 TABLET BY MOUTH ON MONDAY, [facility] AND FRIDAY 10/10/21  Yes Wellington Hampshire, MD  donepezil (ARICEPT) 10 MG tablet Take 10 mg by mouth at bedtime.   Yes [provider]  gabapentin (NEURONTIN) 400 MG capsule Take 1 capsule (400 mg total) by mouth 3 (three) times daily. 10/30/21  Yes Sreenath, Sudheer B, MD  hydrOXYzine (VISTARIL) 25 MG capsule Take 25 mg by mouth at bedtime as needed for anxiety.   Yes [provider]  levothyroxine (SYNTHROID) 112 MCG tablet Take 112 mcg by mouth daily before breakfast.   Yes [provider]  losartan (COZAAR) 25 MG tablet Take 0.5 tablets (12.5 mg total) by mouth daily. 07/12/21  Yes Wellington Hampshire, MD  memantine (NAMENDA) 10 MG tablet Take 10 mg by mouth in the morning and at bedtime. 07/10/21  Yes [provider]  metFORMIN (GLUCOPHAGE-XR) 500 MG 24 hr tablet Take 500 mg by mouth 2 (two) times daily with a meal.    Yes [provider]  methocarbamol (ROBAXIN) 750 MG tablet Take 1 tablet (750 mg total) by mouth 3 (three) times daily. 10/30/21  Yes Sreenath, Sudheer B, MD  metoprolol succinate (TOPROL-XL) 25 MG 24 hr tablet Take 25 mg by mouth daily.   Yes [provider]  mirtazapine (REMERON) 7.5 MG tablet Take 7.5 mg by mouth 2 (two) times daily. 10/10/21  Yes [provider]  omeprazole (PRILOSEC) 20 MG capsule Take 1 capsule (20 mg total) by mouth every morning. 04/12/18  Yes Gladstone Lighter, MD  potassium chloride SA (KLOR-CON M) 20 MEQ tablet Take 1 tablet (20 mEq total) by mouth daily. 07/12/21  Yes Wellington Hampshire, MD  venlafaxine XR (EFFEXOR-XR) 75 MG 24 hr capsule  Take 75 mg by mouth 2 (two) times daily. 10/21/21  Yes [provider]  vitamin B-12 (CYANOCOBALAMIN) 1000 MCG tablet Take 1,000 mcg by mouth 2 (two) times daily.    Yes [provider]  albuterol (ACCUNEB) 0.63 MG/3ML nebulizer solution Take 1 ampule by nebulization every 6 (six) hours as needed for wheezing.    [provider]  albuterol (PROVENTIL HFA;VENTOLIN HFA) 108 (90 Base) MCG/ACT inhaler Inhale 2 puffs into the lungs every 6 (six) hours as needed. 11/02/16   Orbie Pyo, MD  enoxaparin (LOVENOX) 40 MG/0.4ML injection Inject 0.4 mLs (40 mg total) into the skin daily. Patient not taking: Reported on 12/10/2021 10/29/21   Lattie Corns, PA-C  furosemide (LASIX) 20 MG tablet Take 1 tablet (20 mg total) by mouth every other day. May take an additional tablet AS NEEDED for weight gain of 3 lbs overnight or 5 lbs in one week. Do not exceed more than 3 additional doses. - Oral Patient not taking: Reported on 12/10/2021 10/10/21   Wellington Hampshire, MD  HYDROcodone-acetaminophen ([place]) 7.5-325 MG tablet Take 1-2 tablets by mouth every 4 (four) hours as needed for severe pain (pain score 7-10). Patient not taking: Reported on 12/10/2021 10/29/21   Lattie Corns, PA-C  Menthol, Topical Analgesic, (BENGAY EX) Apply 1 application topically daily as needed (back pain).    [provider]  Sennosides (EX-LAX PO) Take 2 tablets by mouth at bedtime as needed (constipation).    [provider]  tiotropium (SPIRIVA) 18 MCG inhalation capsule Place 1 capsule (18 mcg total) into inhaler and inhale daily as needed (shortness of breath). 04/12/18   Gladstone Lighter, MD    Physical Exam: Vitals:   12/10/21 1600 12/10/21 1735 12/10/21 1925 12/10/21 2215  BP: 128/68 125/65 129/85 136/69  Pulse: 78 83 93 92  Resp: 19 20 20 18   Temp:   98.4 F (36.9 C) 97.7 F (36.5 C)  TempSrc:   Oral   SpO2: 99% 98% 97% 96%  Weight:      Height:        Physical Exam Vitals and nursing note reviewed.  Constitutional:      General: She is not in acute distress.  Appearance: She is obese. She is not toxic-appearing.  HENT:     Head: Normocephalic and atraumatic.     Mouth/Throat:     Mouth: Mucous membranes are moist.     Pharynx: Oropharynx is clear.  Eyes:     Conjunctiva/sclera: Conjunctivae normal.     Pupils: Pupils are equal, round, and reactive to light.  Cardiovascular:     Rate and Rhythm: Normal rate and regular rhythm.     Heart sounds: No murmur heard.    No gallop.  Pulmonary:     Effort: Pulmonary effort is normal.     Breath sounds: Normal breath sounds.  Abdominal:     General: Bowel sounds are normal. There is no distension.     Palpations: Abdomen is soft.     Tenderness: There is no abdominal tenderness. There is no guarding.  Musculoskeletal:     Right knee: Swelling (Mild effusion noted of the right knee) present. No erythema. Tenderness (Tenderness to palpation along the anterior aspect) present.     Right lower leg: No edema.     Left lower leg: No edema.  Skin:    General: Skin is warm and dry.  Neurological:     Mental Status: She is alert.     Comments: Alert only to person, but not place, time or situation.  When asked where she is, she states at home.  When asked the date, she states September 1983.  Able to move bilateral upper extremities and left lower extremity against gravity.  Right lower extremity motion limited due to pain.  Psychiatric:        Mood and Affect: Mood normal.        Behavior: Behavior normal.    Data Reviewed: CBC with hemoglobin of 10.9, which is improved from 1 month ago at which time is 8.6.  CMP with potassium of 5.0, glucose of 121, creatinine of 1.39, albumin of 3.1, calcium of 8.1, alkaline phosphatase of 176.  BNP within normal limits at 86.  Troponin measured and improved from prior at 23.  Urinalysis with no abnormalities noted.  COVID-19 PCR positive.  EKG  personally reviewed.  Findings consistent with sinus rhythm with rate of 94.  Atrial sensing, ventricular pacing.  Compared to EKG in September 2023 no changes.  CT Knee Right Wo Contrast  Result Date: 12/10/2021 CLINICAL DATA:  Knee trauma, fall wall standing. EXAM: CT OF THE RIGHT KNEE WITHOUT CONTRAST TECHNIQUE: Multidetector CT imaging of the right knee was performed according to the standard protocol. Multiplanar CT image reconstructions were also generated. RADIATION DOSE REDUCTION: This exam was performed according to the departmental dose-optimization program which includes automated exposure control, adjustment of the mA and/or kV according to patient size and/or use of iterative reconstruction technique. COMPARISON:  Right leg radiographs from 12/10/2021 FINDINGS: Bones/Joint/Cartilage Distal femoral IM nail observed. Transverse fracture of the distal femoral metaphysis skirting the inferior margin of the IM nail. This fracture is mostly nondisplaced, with components visible for example on image 107 series 8, 92 series 8, and 60 of series 8. This likely extends into the anterior intercondylar trochlear groove. Other intra-articular extension is not entirely excluded. Tricompartmental osteoarthritis with severe medial compartmental articular space narrowing and tricompartmental spurring. Moderate-sized lipohemarthrosis. Ligaments Suboptimally assessed by CT. Muscles and Tendons Regional muscular atrophy. This most notably involves the semimembranosus muscle. Soft tissues No supplemental non-categorized findings. IMPRESSION: 1. Acute transverse fracture of the distal femoral metaphysis skirting the inferior margin of the IM nail.  This fracture is mostly nondisplaced. This fracture likely extends into the anterior intercondylar trochlear groove. Other intra-articular extension is not entirely excluded. 2. Moderate-sized lipohemarthrosis. 3. 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 4. Regional muscular atrophy most notably involves the semimembranosus muscle. Electronically Signed   By: Van Clines M.D.   On: 12/10/2021 16:36   CT HEAD WO CONTRAST (5MM)  Result Date: 12/10/2021 CLINICAL DATA:  Fall yesterday. EXAM: CT HEAD WITHOUT CONTRAST CT CERVICAL SPINE WITHOUT CONTRAST TECHNIQUE: Multidetector CT imaging of the head and cervical spine was performed following the standard protocol without intravenous contrast. Multiplanar CT image reconstructions of the cervical spine were also generated. RADIATION DOSE REDUCTION: This exam was performed according to the departmental dose-optimization program which includes automated exposure control, adjustment of the mA and/or kV according to patient size and/or use of iterative reconstruction technique. COMPARISON:  CT head 06/21/2020 FINDINGS: CT HEAD FINDINGS Brain: There is no acute intracranial hemorrhage, extra-axial fluid collection, or acute territorial infarct. There is mild background parenchymal volume loss the ventricles are normal in size. There are numerous areas of hypodensity in the bilateral frontal and occipital lobe cortices consistent with prior infarcts, remote in appearance but some of which are new since 06/21/2020. Additional patchy hypodensity in the supratentorial white matter likely reflects sequela of chronic small-vessel ischemic change. There is no mass lesion.  There is no mass effect or midline shift. Vascular: No hyperdense vessel or unexpected calcification. Skull: Normal. Negative for fracture or focal lesion. Sinuses/Orbits: There is layering fluid in the right maxillary sinus. Bilateral lens implants are in place. The globes and orbits are otherwise unremarkable. Other: None. CT CERVICAL SPINE FINDINGS Alignment: There is reversal of the normal cervical lordosis centered at C4. There is 4-5 mm anterolisthesis of C4 on C5, likely degenerative in nature. There is no jumped or  perched facet or other evidence of traumatic malalignment. Skull base and vertebrae: Skull base alignment is maintained. Vertebral body heights are preserved. There is no evidence of acute fracture. There is no suspicious osseous lesion. Soft tissues and spinal canal: No prevertebral fluid or swelling. No visible canal hematoma. Disc levels: There is disc space narrowing and degenerative endplate change most advanced at C4-C5 through C6-C7. Facet arthropathy is most advanced on the right at C2-C3 and bilaterally at C3-C4. There is no evidence of high-grade spinal canal stenosis. Upper chest: There is emphysema in the lung apices. Other: None. IMPRESSION: 1. No acute intracranial hemorrhage or calvarial fracture. 2. 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 If there is clinical concern for acute ischemia, MRI is recommended. 3. No acute fracture or traumatic malalignment of the cervical spine. 4. Multilevel degenerative changes as above with 4-5 mm degenerative anterolisthesis of C4 on C5. Electronically Signed   By: Valetta Mole M.D.   On: 12/10/2021 16:07   CT Cervical Spine Wo Contrast  Result Date: 12/10/2021 CLINICAL DATA:  Fall yesterday. EXAM: CT HEAD WITHOUT CONTRAST CT CERVICAL SPINE WITHOUT CONTRAST TECHNIQUE: Multidetector CT imaging of the head and cervical spine was performed following the standard protocol without intravenous contrast. Multiplanar CT image reconstructions of the cervical spine were also generated. RADIATION DOSE REDUCTION: This exam was performed according to the departmental dose-optimization program which includes automated exposure control, adjustment of the mA and/or kV according to patient size and/or use of iterative reconstruction technique. COMPARISON:  CT head 06/21/2020 FINDINGS: CT HEAD FINDINGS Brain: There is  no acute intracranial hemorrhage, extra-axial fluid collection, or acute territorial  infarct. There is mild background parenchymal volume loss the ventricles are normal in size. There are numerous areas of hypodensity in the bilateral frontal and occipital lobe cortices consistent with prior infarcts, remote in appearance but some of which are new since 06/21/2020. Additional patchy hypodensity in the supratentorial white matter likely reflects sequela of chronic small-vessel ischemic change. There is no mass lesion.  There is no mass effect or midline shift. Vascular: No hyperdense vessel or unexpected calcification. Skull: Normal. Negative for fracture or focal lesion. Sinuses/Orbits: There is layering fluid in the right maxillary sinus. Bilateral lens implants are in place. The globes and orbits are otherwise unremarkable. Other: None. CT CERVICAL SPINE FINDINGS Alignment: There is reversal of the normal cervical lordosis centered at C4. There is 4-5 mm anterolisthesis of C4 on C5, likely degenerative in nature. There is no jumped or perched facet or other evidence of traumatic malalignment. Skull base and vertebrae: Skull base alignment is maintained. Vertebral body heights are preserved. There is no evidence of acute fracture. There is no suspicious osseous lesion. Soft tissues and spinal canal: No prevertebral fluid or swelling. No visible canal hematoma. Disc levels: There is disc space narrowing and degenerative endplate change most advanced at C4-C5 through C6-C7. Facet arthropathy is most advanced on the right at C2-C3 and bilaterally at C3-C4. There is no evidence of high-grade spinal canal stenosis. Upper chest: There is emphysema in the lung apices. Other: None. IMPRESSION: 1. No acute intracranial hemorrhage or calvarial fracture. 2. 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 If there is clinical concern for acute ischemia, MRI is recommended. 3. No acute fracture or traumatic malalignment of the cervical  spine. 4. Multilevel degenerative changes as above with 4-5 mm degenerative anterolisthesis of C4 on C5. Electronically Signed   By: Valetta Mole M.D.   On: 12/10/2021 16:07   DG Tibia/Fibula Right  Result Date: 12/10/2021 CLINICAL DATA:  Pain after fall. EXAM: CHEST  1 VIEW; RIGHT TIBIA AND FIBULA - 2 VIEW; PELVIS - 1-2 VIEW COMPARISON:  Right femur 12/10/2021 FINDINGS: Intramedullary nail extends into the distal right femur. Severe joint space loss along the medial knee joint. Right knee appears to be located. Negative for fracture involving the right tibia or fibula. Degenerative changes along the medial malleolus. Calcaneal spurring particularly at the Achilles tendon insertion site. IMPRESSION: 1. No acute bone abnormality to the right lower extremity. 2. Severe degenerative changes in the right knee. 3. Calcaneal spurring. Electronically Signed   By: Markus Daft M.D.   On: 12/10/2021 14:45   DG Pelvis 1-2 Views  Result Date: 12/10/2021 CLINICAL DATA:  Pain after fall. EXAM: CHEST  1 VIEW; RIGHT TIBIA AND FIBULA - 2 VIEW; PELVIS - 1-2 VIEW COMPARISON:  Right femur 12/10/2021 FINDINGS: Intramedullary nail extends into the distal right femur. Severe joint space loss along the medial knee joint. Right knee appears to be located. Negative for fracture involving the right tibia or fibula. Degenerative changes along the medial malleolus. Calcaneal spurring particularly at the Achilles tendon insertion site. IMPRESSION: 1. No acute bone abnormality to the right lower extremity. 2. Severe degenerative changes in the right knee. 3. Calcaneal spurring. Electronically Signed   By: Markus Daft M.D.   On: 12/10/2021 14:45   DG Chest 1 View  Result Date: 12/10/2021 CLINICAL DATA:  Pain after fall. EXAM: CHEST  1 VIEW;  RIGHT TIBIA AND FIBULA - 2 VIEW; PELVIS - 1-2 VIEW COMPARISON:  Right femur 12/10/2021 FINDINGS: Intramedullary nail extends into the distal right femur. Severe joint space loss along the medial knee  joint. Right knee appears to be located. Negative for fracture involving the right tibia or fibula. Degenerative changes along the medial malleolus. Calcaneal spurring particularly at the Achilles tendon insertion site. IMPRESSION: 1. No acute bone abnormality to the right lower extremity. 2. Severe degenerative changes in the right knee. 3. Calcaneal spurring. Electronically Signed   By: Markus Daft M.D.   On: 12/10/2021 14:45   DG Femur Min 2 Views Right  Result Date: 12/10/2021 CLINICAL DATA:  Hip pain after fall EXAM: RIGHT FEMUR 2 VIEWS COMPARISON:  10/23/2021 FINDINGS: Evidence of internal fixation across previous femoral neck fracture. No visible acute fracture. No hardware complicating feature. No subluxation or dislocation. IMPRESSION: Remote posttraumatic and post surgical changes across the right femoral neck fracture. No acute bony abnormality. Electronically Signed   By: Rolm Baptise M.D.   On: 12/10/2021 14:43     There are no new results to review at this time.  Assessment and Plan: * Femur fracture [facility]) Patient presenting after a ground-level fall with CT imaging remarkable for acute transverse fracture of the distal femoral meta physis guarding the inferior margin of the IM nail.  Initially, there was concern patient would need to go back to the OR tomorrow.  I discussed with Dr. Roland Rack, who is currently recommending nonsurgical management.  She will be fitted with a brace tomorrow.    - Orthopedic surgery following; appreciate their recommendations - Brace placement on 11/7 - PT/OT once cleared by orthopedic surgery.  Of note, patient's husband declined SNF placement if that is recommended.  She already has home health set up. - Tylenol and morphine for pain management  Fall Patient presenting after mechanical fall at home in the setting of right knee buckling.  Per x-rays obtained during this admission, patient has severe osteoarthritic changes.  Patient's husband did not notice  any loss of consciousness and denies patient complaining of any dizziness, palpitations, chest pain or shortness of breath at the time.  -PT/OT once cleared by orthopedic surgery  COVID-19 virus infection On discussion with patient's husband, Ms. Fogelman contracted COVID-19 while at SNF, at which she was discharged on 10/15.  Per chart review, Mr. Valentino Saxon contacted patient's cardiologist on 10/30 stating patient is still not feeling well and is experiencing upset stomach with poor p.o. intake.  Patient is outside of the 10-day quarantine window and positive test is in the setting of recent infection.  No indication at this time for antivirals.  - Discontinue airborne precautions  AKI (acute kidney injury) ([place]) In the setting of poor p.o. intake and decreased appetite after recent COVID-19 infection.  Patient was taking furosemide but had it recently discontinued due to this.  - Hold home nephrotoxic agents - Gentle IV fluid resuscitation - Repeat BMP in the morning  Dilated cardiomyopathy ([place]) Euvolemic at this time.  - Holding home furosemide, losartan in the setting of AKI - Continue home digoxin 3 times weekly, and daily metoprolol  Dementia ([place]) Per chart review, patient has a history of dementia for which she is currently on donepezil and memantine.  She is only oriented to person at this time, which is patient's husband states is her baseline.  - Continue donepezil and memantine - Delirium precautions  Type 2 diabetes mellitus with hyperglycemia, unspecified whether long term insulin  use (HCC) - SSI, moderate - Holding home metformin   Advance Care Planning:   Code Status: Full Code   Consults: Orthopedic Surgery  Family Communication: Patient's husband updated via telephone.   Severity of Illness: The appropriate patient status for this patient is INPATIENT. Inpatient status is judged to be reasonable and necessary in order to provide the required intensity of  service to ensure the patient's safety. The patient's presenting symptoms, physical exam findings, and initial radiographic and laboratory data in the context of their chronic comorbidities is felt to place them at high risk for further clinical deterioration. Furthermore, it is not anticipated that the patient will be medically stable for discharge from the hospital within 2 midnights of admission.   * I certify that at the point of admission it is my clinical judgment that the patient will require inpatient hospital care spanning beyond 2 midnights from the point of admission due to high intensity of service, high risk for further deterioration and high frequency of surveillance required.*  Author: Jose Persia, MD 12/10/2021 11:34 PM  For on call review [email or web address].

## 2021-12-10 NOTE — ED Notes (Signed)
Patient pulled off cardiac monitoring - states "I'm not wearing this anymore" ER MD at bedside and is OK with patient not wearing monitoring equipment.   Vitals reassessed. Pt offered pain medication, declined. All needs met at this time. Purewick in place.

## 2021-12-10 NOTE — Assessment & Plan Note (Addendum)
Patient presenting after mechanical fall at home in the setting of right knee buckling.  Per x-rays obtained during this admission, patient has severe osteoarthritic changes.  Patient's husband did not notice any loss of consciousness and denies patient complaining of any dizziness, palpitations, chest pain or shortness of breath at the time.  -PT/OT once cleared by orthopedic surgery

## 2021-12-11 ENCOUNTER — Encounter
Admission: EM | Disposition: A | Discharge status: Home Health | Payer: Self-pay | Source: Home / Self Care | Attending: Internal Medicine

## 2021-12-11 DIAGNOSIS — W19XXXA Unspecified fall, initial encounter: Secondary | ICD-10-CM | POA: Diagnosis not present

## 2021-12-11 DIAGNOSIS — M25561 Pain in right knee: Secondary | ICD-10-CM

## 2021-12-11 DIAGNOSIS — F0390 Unspecified dementia without behavioral disturbance: Secondary | ICD-10-CM | POA: Diagnosis not present

## 2021-12-11 DIAGNOSIS — E1165 Type 2 diabetes mellitus with hyperglycemia: Secondary | ICD-10-CM

## 2021-12-11 LAB — CBC WITH DIFFERENTIAL/PLATELET
Abs Immature Granulocytes: 0.04 10*3/uL (ref 0.00–0.07)
Basophils Absolute: 0 10*3/uL (ref 0.0–0.1)
Basophils Relative: 0 %
Eosinophils Absolute: 0.1 10*3/uL (ref 0.0–0.5)
Eosinophils Relative: 1 %
HCT: 36.4 % (ref 36.0–46.0)
Hemoglobin: 11.2 g/dL — ABNORMAL LOW (ref 12.0–15.0)
Immature Granulocytes: 1 %
Lymphocytes Relative: 11 %
Lymphs Abs: 0.8 10*3/uL (ref 0.7–4.0)
MCH: 25.6 pg — ABNORMAL LOW (ref 26.0–34.0)
MCHC: 30.8 g/dL (ref 30.0–36.0)
MCV: 83.1 fL (ref 80.0–100.0)
Monocytes Absolute: 0.9 10*3/uL (ref 0.1–1.0)
Monocytes Relative: 12 %
Neutro Abs: 5.9 10*3/uL (ref 1.7–7.7)
Neutrophils Relative %: 75 %
Platelets: 164 10*3/uL (ref 150–400)
RBC: 4.38 MIL/uL (ref 3.87–5.11)
RDW: 23.1 % — ABNORMAL HIGH (ref 11.5–15.5)
WBC: 7.8 10*3/uL (ref 4.0–10.5)
nRBC: 0 % (ref 0.0–0.2)

## 2021-12-11 LAB — BASIC METABOLIC PANEL
Anion gap: 7 (ref 5–15)
BUN: 20 mg/dL (ref 8–23)
CO2: 22 mmol/L (ref 22–32)
Calcium: 8.2 mg/dL — ABNORMAL LOW (ref 8.9–10.3)
Chloride: 111 mmol/L (ref 98–111)
Creatinine, Ser: 1.38 mg/dL — ABNORMAL HIGH (ref 0.44–1.00)
GFR, Estimated: 41 mL/min — ABNORMAL LOW (ref >60)
Glucose, Bld: 123 mg/dL — ABNORMAL HIGH (ref 70–99)
Potassium: 4.5 mmol/L (ref 3.5–5.1)
Sodium: 140 mmol/L (ref 135–145)

## 2021-12-11 LAB — GLUCOSE, CAPILLARY
Glucose-Capillary: 97 mg/dL (ref 70–99)
Glucose-Capillary: 108 mg/dL — ABNORMAL HIGH (ref 70–99)
Glucose-Capillary: 113 mg/dL — ABNORMAL HIGH (ref 70–99)

## 2021-12-11 SURGERY — OPEN REDUCTION INTERNAL FIXATION (ORIF) DISTAL FEMUR FRACTURE
Anesthesia: Choice | Laterality: Right

## 2021-12-11 MED ORDER — ENOXAPARIN SODIUM 40 MG/0.4ML IJ SOSY
40.0000 mg | PREFILLED_SYRINGE | INTRAMUSCULAR | Status: DC
  Administered 2021-12-11 – 2021-12-12 (×2): 40 mg via SUBCUTANEOUS
  Filled 2021-12-11 (×2): qty 0.4

## 2021-12-11 MED ORDER — LACTATED RINGERS IV SOLN: INTRAVENOUS | Status: AC

## 2021-12-11 NOTE — Evaluation (Signed)
Physical Therapy Evaluation Patient Details Name: Stacy Grimes MRN: [id number] DOB: 12-10-1950 Today's Date: 12/11/2021  History of Present Illness  Patient is a 71 year old female, PMH of HTN, CHF, COPD, cardiac defibrillator, DM, AAA, dementia, spinal fx, O2 use, CVA, L ear deafness and recent IM nailing of displaced intertrochanteric fracture on 10/23/2021. 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  Clinical Impression  Pt laying in bed clearly uncomfortable and with some confusion.  She showed poor tolerate to any movement involving R LE and despite multiple +2 heavily assisted (and purposefully gentle) attempts to get to sitting EOB she consistently screamed out in pain with demands to stop, etc.  Despite much encouragement and reinforcement PT and OT could no get her to sitting EOB and ultimately is became very obvious that she was not going to participate with anything due to severe R knee pain.  Attempts at gentle LE exercises (P/AAROM) were met with similar agitated resistance.      Recommendations for follow up therapy are one component of a multi-disciplinary discharge planning process, led by the attending physician.  Recommendations may be updated based on patient status, additional functional criteria and insurance authorization.  Follow Up Recommendations Skilled nursing-short term rehab (<3 hours/day) Can patient physically be transported by private vehicle: No    Assistance Recommended at Discharge Frequent or constant Supervision/Assistance  Patient can return home with the following  Two people to help with walking and/or transfers;Two people to help with bathing/dressing/bathroom;Assistance with cooking/housework;Help with stairs or ramp for entrance;Assist for transportation    Equipment Recommendations  (TBD at next venue)  Recommendations for Other Services        Functional Status Assessment Patient has had a recent decline in their functional status and demonstrates the ability to make significant improvements in function in a reasonable and predictable amount of time.     Precautions / Restrictions Precautions Precautions: Fall Required Braces or Orthoses: Other Brace Other Brace: hinged knee brace locked in extension Restrictions Weight Bearing Restrictions: Yes RLE Weight Bearing: Non weight bearing      Mobility  Bed Mobility Overal bed mobility: Needs Assistance Bed Mobility: Rolling, Supine to Sit Rolling: Max assist   Supine to sit: +2 for physical assistance, Total assist     General bed mobility comments: Pt unable to tolerate even very gentle +2 max assist with attempt to help her to sitting.  Pt yelling and becoming agitated with multiple attempts, ultimately aborted further attempts of bed mobiltiy.    Transfers                   General transfer comment: unable/unsafe    Ambulation/Gait                  Stairs            Wheelchair Mobility    Modified Rankin (Stroke Patients Only)       Balance   Sitting-balance support:  (could not tolerate even getting to upright at EOB 2/2 severe c/o pain)                                         Pertinent Vitals/Pain Pain Assessment Pain Assessment: Faces Faces Pain Scale: Hurts whole lot Pain Location: R knee with any palpation or movement  Home Living Family/patient expects to be discharged to:: Private residence Living Arrangements: Spouse/significant other Available Help at Discharge: Family;Available 24 hours/day Type of Home: Mobile home Home Access: Ramped entrance       Home Layout: One level Home Equipment: Conservation officer, nature (2 wheels);Cane - single point;Rollator (4 wheels);BSC/3in1;Electric scooter Additional Comments: setup per chart from prior admission    Prior Function Prior Level of Function : Patient  poor historian/Family not available             Mobility Comments: recently d/c from [facility]       Hand Dominance   Dominant Hand: Right    Extremity/Trunk Assessment   Upper Extremity Assessment Upper Extremity Assessment: Generalized weakness;Difficult to assess due to impaired cognition    Lower Extremity Assessment Lower Extremity Assessment: Generalized weakness;Difficult to assess due to impaired cognition       Communication   Communication: No difficulties  Cognition Arousal/Alertness: Awake/alert Behavior During Therapy: Anxious, Agitated Overall Cognitive Status: History of cognitive impairments - at baseline                                 General Comments: unsure of true baseline but pt shows poor awareness about situation, location, etc        General Comments General comments (skin integrity, edema, etc.): Pt extremely sensitive to any movement in the R LE, tolerates little to no AA/PROM    Exercises     Assessment/Plan    PT Assessment Patient needs continued PT services  PT Problem List Decreased strength;Decreased range of motion;Decreased activity tolerance;Decreased balance;Decreased mobility;Decreased cognition;Decreased knowledge of use of DME;Decreased safety awareness;Pain       PT Treatment Interventions Gait training;DME instruction;Functional mobility training;Therapeutic activities;Therapeutic exercise;Balance training;Cognitive remediation;Patient/family education    PT Goals (Current goals can be found in the Care Plan section)  Acute Rehab PT Goals Patient Stated Goal: get therapists out of her room and watch TV PT Goal Formulation: Patient unable to participate in goal setting Time For Goal Achievement: 12/24/21 Potential to Achieve Goals: Fair    Frequency Min 2X/week     Co-evaluation   Reason for Co-Treatment: Necessary to address cognition/behavior during functional activity;For patient/therapist safety PT  goals addressed during session: Mobility/safety with mobility OT goals addressed during session: ADL's and self-care       AM-PAC PT "6 Clicks" Mobility  Outcome Measure Help needed turning from your back to your side while in a flat bed without using bedrails?: Total Help needed moving from lying on your back to sitting on the side of a flat bed without using bedrails?: Total Help needed moving to and from a bed to a chair (including a wheelchair)?: Total Help needed standing up from a chair using your arms (e.g., wheelchair or bedside chair)?: Total Help needed to walk in hospital room?: Total Help needed climbing 3-5 steps with a railing? : Total 6 Click Score: 6    End of Session Equipment Utilized During Treatment: Gait belt Activity Tolerance: Patient limited by pain Patient left: with bed alarm set;with call bell/phone within reach Nurse Communication: Mobility status PT Visit Diagnosis: Muscle weakness (generalized) (M62.81);Difficulty in walking, not elsewhere classified (R26.2);Pain;History of falling (Z91.81) Pain - Right/Left: Right Pain - part of body: Knee    Time: 1430-1450 PT Time Calculation (min) (ACUTE ONLY): 20 min   Charges:   PT Evaluation $PT Eval Moderate Complexity: 1 Mod  Kreg Shropshire, DPT 12/11/2021, 3:25 PM

## 2021-12-11 NOTE — Progress Notes (Signed)
[facility]                                                                              Stacy Grimes, is a 71 y.o. female, DOB - 01/30/1951, GLO:[id number] Admit date - 12/10/2021    Outpatient Primary MD for the patient is Cyndi Bender, PA-C  LOS - 1  days  Chief Complaint  Patient presents with   Fall   Leg Pain       Brief summary   Patient is a 71 year old female with COPD, CAD, cardiomyopathy, OSA, hypothyroidism, HTN, anxiety, osteopenia lives independently with her husband.  Recent right hip fracture status post IM nailing of displaced intertrochanteric fracture on 10/23/2021, has been healing well from the fracture and has been at home with her husband for the past several weeks. Apparently, while getting up to use the bedside commode, she lost her balance and fell forward onto both knees.  She complained of knee pain and therefore was brought to the emergency room where x-rays were unremarkable.  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  Labs showed creatinine of 1.39, was 0.69 on 10/27/2021, mildly elevated troponin 23, BNP 96.6.  Hemoglobin 10.9. Marland Kitchen Patient was admitted due to acute pain, AKI and orthopedics was consulted.  Assessment & Plan    Principal Problem: Right, nondisplaced femur fracture ([place]) with mechanical fall -Orthopedics consulted, recommended no surgical management -Rec due to hinged knee brace with hinges locked in extension.  Nonweightbearing for at least 6 to 8 weeks before starting weightbearing. -Continue pain control, overnight needed IV morphine,  - DVT prophylaxis -PT OT eval  Active Problems:    AKI (acute kidney injury) ([place]) -In the setting of poor p.o. intake and decreased appetite after recent COVID-19.  Patient had been taking furosemide but recently discontinued -Hold losartan, Lasix -Start gentle hydration for 24 hours      recent COVID-19 virus infection -Patient had contracted COVID-19 while at SNF, was discharged on 10/15, outside of 10-day window.   History of systolic CHF, dilated cardiomyopathy ([place]) -2D echo 10/2021, with EF of 20 to 45%, global hypokinesis -Currently euvolemic, continue gentle hydration, monitor CODE STATUS     Dementia ([place]) -Delirium precautions, continue donepezil, memantine    Type 2 diabetes mellitus with hyperglycemia, unspecified whether long term insulin use ([facility]) -Continue sliding scale insulin while inpatient, hold metformin  Obesity Estimated body mass index is 31.71 kg/m as calculated from the following:   Height as of this encounter: 5\' 3"  (1.6 m).   Weight as of this encounter: 81.2 kg.  Code Status: Full CODE STATUS DVT Prophylaxis:  Lovenox Level of Care: Level of care: Med-Surg Family Communication: Called patient's husband, unable to make contact.   Disposition Plan:   TBD    Procedures:  None   Consultants:   ortho Antimicrobials:     Medications  allopurinol  300 mg Oral Daily   atorvastatin  80 mg Oral Daily   clopidogrel  75 mg Oral Daily   [START ON 12/12/2021] digoxin  0.125 mg Oral Once per day  on Mon Wed Fri   donepezil  10 mg Oral QHS   fluticasone furoate-vilanterol  1 puff Inhalation Daily   gabapentin  400 mg Oral TID   insulin aspart  0-15 Units Subcutaneous TID WC   levothyroxine  112 mcg Oral QAC breakfast   memantine  10 mg Oral BID   metoprolol succinate  25 mg Oral Daily   mirtazapine  7.5 mg Oral BID   multivitamin with minerals  1 tablet Oral Daily   sodium chloride flush  3 mL Intravenous Q12H   venlafaxine XR  75 mg Oral BID      Subjective:   Stacy Grimes was seen and examined today.  Still has pain in the right leg.  No chest pain, shortness of breath.  No acute nausea vomiting or abdominal pain.   Objective:   Vitals:   12/10/21 1735 12/10/21 1925 12/10/21 2215 12/11/21 0735  BP: 125/65 129/85 136/69  137/72  Pulse: 83 93 92 95  Resp: 20 20 18    Temp:  98.4 F (36.9 C) 97.7 F (36.5 C) (!) 97.5 F (36.4 C)  TempSrc:  Oral    SpO2: 98% 97% 96% 97%  Weight:      Height:       No intake or output data in the 24 hours ending 12/11/21 1049   Wt Readings from Last 3 Encounters:  12/10/21 81.2 kg  10/30/21 93.3 kg  07/10/21 81.3 kg     Exam General: Alert and oriented x self, NAD Cardiovascular: S1 S2 auscultated,  RRR Respiratory: CTAB Gastrointestinal: Soft, nontender, nondistended, + bowel sounds Ext: no pedal edema bilaterally Neuro: no new deficits Psych: dementia    Data Reviewed:  I have personally reviewed following labs    CBC Lab Results  Component Value Date   WBC 7.8 12/11/2021   RBC 4.38 12/11/2021   HGB 11.2 (L) 12/11/2021   HCT 36.4 12/11/2021   MCV 83.1 12/11/2021   MCH 25.6 (L) 12/11/2021   PLT 164 12/11/2021   MCHC 30.8 12/11/2021   RDW 23.1 (H) 12/11/2021   LYMPHSABS 0.8 12/11/2021   MONOABS 0.9 12/11/2021   EOSABS 0.1 12/11/2021   BASOSABS 0.0 85/46/2703     Last metabolic panel Lab Results  Component Value Date   NA 140 12/11/2021   K 4.5 12/11/2021   CL 111 12/11/2021   CO2 22 12/11/2021   BUN 20 12/11/2021   CREATININE 1.38 (H) 12/11/2021   GLUCOSE 123 (H) 12/11/2021   GFRNONAA 41 (L) 12/11/2021   GFRAA >60 04/12/2018   CALCIUM 8.2 (L) 12/11/2021   PHOS 2.8 10/27/2021   PROT 7.5 12/10/2021   ALBUMIN 3.1 (L) 12/10/2021   LABGLOB 4.0 03/28/2016   AGRATIO 1.1 (L) 03/28/2016   BILITOT 0.8 12/10/2021   ALKPHOS 176 (H) 12/10/2021   AST 19 12/10/2021   ALT 11 12/10/2021   ANIONGAP 7 12/11/2021    CBG (last 3)  No results for input(s): "GLUCAP" in the last 72 hours.    Coagulation Profile: No results for input(s): "INR", "PROTIME" in the last 168 hours.   Radiology Studies: I have personally reviewed the imaging studies  CT Knee Right Wo Contrast  Result Date: 12/10/2021 CLINICAL DATA:  Knee trauma, fall wall  standing. EXAM: CT OF THE RIGHT KNEE WITHOUT CONTRAST TECHNIQUE: Multidetector CT imaging of the right knee was performed according to the standard protocol. Multiplanar CT image reconstructions were also generated. RADIATION DOSE REDUCTION: This exam was performed according to the  departmental dose-optimization program which includes automated exposure control, adjustment of the mA and/or kV according to patient size and/or use of iterative reconstruction technique. COMPARISON:  Right leg radiographs from 12/10/2021 FINDINGS: Bones/Joint/Cartilage Distal femoral IM nail observed. Transverse fracture of the distal femoral metaphysis skirting the inferior margin of the IM nail. This fracture is mostly nondisplaced, with components visible for example on image 107 series 8, 92 series 8, and 60 of series 8. This likely extends into the anterior intercondylar trochlear groove. Other intra-articular extension is not entirely excluded. Tricompartmental osteoarthritis with severe medial compartmental articular space narrowing and tricompartmental spurring. Moderate-sized lipohemarthrosis. Ligaments Suboptimally assessed by CT. Muscles and Tendons Regional muscular atrophy. This most notably involves the semimembranosus muscle. Soft tissues No supplemental non-categorized findings. IMPRESSION: 1. Acute transverse fracture of the distal femoral metaphysis skirting the inferior margin of the IM nail. This fracture is mostly nondisplaced. This fracture likely extends into the anterior intercondylar trochlear groove. Other intra-articular extension is not entirely excluded. 2. Moderate-sized lipohemarthrosis. 3. 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 4. Regional muscular atrophy most notably involves the semimembranosus muscle. Electronically Signed   By: Van Clines M.D.   On: 12/10/2021 16:36   CT HEAD WO CONTRAST (5MM)  Result Date: 12/10/2021 CLINICAL DATA:  Fall  yesterday. EXAM: CT HEAD WITHOUT CONTRAST CT CERVICAL SPINE WITHOUT CONTRAST TECHNIQUE: Multidetector CT imaging of the head and cervical spine was performed following the standard protocol without intravenous contrast. Multiplanar CT image reconstructions of the cervical spine were also generated. RADIATION DOSE REDUCTION: This exam was performed according to the departmental dose-optimization program which includes automated exposure control, adjustment of the mA and/or kV according to patient size and/or use of iterative reconstruction technique. COMPARISON:  CT head 06/21/2020 FINDINGS: CT HEAD FINDINGS Brain: There is no acute intracranial hemorrhage, extra-axial fluid collection, or acute territorial infarct. There is mild background parenchymal volume loss the ventricles are normal in size. There are numerous areas of hypodensity in the bilateral frontal and occipital lobe cortices consistent with prior infarcts, remote in appearance but some of which are new since 06/21/2020. Additional patchy hypodensity in the supratentorial white matter likely reflects sequela of chronic small-vessel ischemic change. There is no mass lesion.  There is no mass effect or midline shift. Vascular: No hyperdense vessel or unexpected calcification. Skull: Normal. Negative for fracture or focal lesion. Sinuses/Orbits: There is layering fluid in the right maxillary sinus. Bilateral lens implants are in place. The globes and orbits are otherwise unremarkable. Other: None. CT CERVICAL SPINE FINDINGS Alignment: There is reversal of the normal cervical lordosis centered at C4. There is 4-5 mm anterolisthesis of C4 on C5, likely degenerative in nature. There is no jumped or perched facet or other evidence of traumatic malalignment. Skull base and vertebrae: Skull base alignment is maintained. Vertebral body heights are preserved. There is no evidence of acute fracture. There is no suspicious osseous lesion. Soft tissues and spinal  canal: No prevertebral fluid or swelling. No visible canal hematoma. Disc levels: There is disc space narrowing and degenerative endplate change most advanced at C4-C5 through C6-C7. Facet arthropathy is most advanced on the right at C2-C3 and bilaterally at C3-C4. There is no evidence of high-grade spinal canal stenosis. Upper chest: There is emphysema in the lung apices. Other: None. IMPRESSION: 1. No acute intracranial hemorrhage or calvarial fracture. 2. 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 If there is clinical concern  for acute ischemia, MRI is recommended. 3. No acute fracture or traumatic malalignment of the cervical spine. 4. Multilevel degenerative changes as above with 4-5 mm degenerative anterolisthesis of C4 on C5. Electronically Signed   By: Valetta Mole M.D.   On: 12/10/2021 16:07   CT Cervical Spine Wo Contrast  Result Date: 12/10/2021 CLINICAL DATA:  Fall yesterday. EXAM: CT HEAD WITHOUT CONTRAST CT CERVICAL SPINE WITHOUT CONTRAST TECHNIQUE: Multidetector CT imaging of the head and cervical spine was performed following the standard protocol without intravenous contrast. Multiplanar CT image reconstructions of the cervical spine were also generated. RADIATION DOSE REDUCTION: This exam was performed according to the departmental dose-optimization program which includes automated exposure control, adjustment of the mA and/or kV according to patient size and/or use of iterative reconstruction technique. COMPARISON:  CT head 06/21/2020 FINDINGS: CT HEAD FINDINGS Brain: There is no acute intracranial hemorrhage, extra-axial fluid collection, or acute territorial infarct. There is mild background parenchymal volume loss the ventricles are normal in size. There are numerous areas of hypodensity in the bilateral frontal and occipital lobe cortices consistent with prior infarcts, remote in appearance but some of which are  new since 06/21/2020. Additional patchy hypodensity in the supratentorial white matter likely reflects sequela of chronic small-vessel ischemic change. There is no mass lesion.  There is no mass effect or midline shift. Vascular: No hyperdense vessel or unexpected calcification. Skull: Normal. Negative for fracture or focal lesion. Sinuses/Orbits: There is layering fluid in the right maxillary sinus. Bilateral lens implants are in place. The globes and orbits are otherwise unremarkable. Other: None. CT CERVICAL SPINE FINDINGS Alignment: There is reversal of the normal cervical lordosis centered at C4. There is 4-5 mm anterolisthesis of C4 on C5, likely degenerative in nature. There is no jumped or perched facet or other evidence of traumatic malalignment. Skull base and vertebrae: Skull base alignment is maintained. Vertebral body heights are preserved. There is no evidence of acute fracture. There is no suspicious osseous lesion. Soft tissues and spinal canal: No prevertebral fluid or swelling. No visible canal hematoma. Disc levels: There is disc space narrowing and degenerative endplate change most advanced at C4-C5 through C6-C7. Facet arthropathy is most advanced on the right at C2-C3 and bilaterally at C3-C4. There is no evidence of high-grade spinal canal stenosis. Upper chest: There is emphysema in the lung apices. Other: None. IMPRESSION: 1. No acute intracranial hemorrhage or calvarial fracture. 2. 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 If there is clinical concern for acute ischemia, MRI is recommended. 3. No acute fracture or traumatic malalignment of the cervical spine. 4. Multilevel degenerative changes as above with 4-5 mm degenerative anterolisthesis of C4 on C5. Electronically Signed   By: Valetta Mole M.D.   On: 12/10/2021 16:07   DG Tibia/Fibula Right  Result Date: 12/10/2021 CLINICAL DATA:  Pain after fall.  EXAM: CHEST  1 VIEW; RIGHT TIBIA AND FIBULA - 2 VIEW; PELVIS - 1-2 VIEW COMPARISON:  Right femur 12/10/2021 FINDINGS: Intramedullary nail extends into the distal right femur. Severe joint space loss along the medial knee joint. Right knee appears to be located. Negative for fracture involving the right tibia or fibula. Degenerative changes along the medial malleolus. Calcaneal spurring particularly at the Achilles tendon insertion site. IMPRESSION: 1. No acute bone abnormality to the right lower extremity. 2. Severe degenerative changes in the right knee. 3. Calcaneal spurring. Electronically Signed   By: Markus Daft  M.D.   On: 12/10/2021 14:45   DG Pelvis 1-2 Views  Result Date: 12/10/2021 CLINICAL DATA:  Pain after fall. EXAM: CHEST  1 VIEW; RIGHT TIBIA AND FIBULA - 2 VIEW; PELVIS - 1-2 VIEW COMPARISON:  Right femur 12/10/2021 FINDINGS: Intramedullary nail extends into the distal right femur. Severe joint space loss along the medial knee joint. Right knee appears to be located. Negative for fracture involving the right tibia or fibula. Degenerative changes along the medial malleolus. Calcaneal spurring particularly at the Achilles tendon insertion site. IMPRESSION: 1. No acute bone abnormality to the right lower extremity. 2. Severe degenerative changes in the right knee. 3. Calcaneal spurring. Electronically Signed   By: Markus Daft M.D.   On: 12/10/2021 14:45   DG Chest 1 View  Result Date: 12/10/2021 CLINICAL DATA:  Pain after fall. EXAM: CHEST  1 VIEW; RIGHT TIBIA AND FIBULA - 2 VIEW; PELVIS - 1-2 VIEW COMPARISON:  Right femur 12/10/2021 FINDINGS: Intramedullary nail extends into the distal right femur. Severe joint space loss along the medial knee joint. Right knee appears to be located. Negative for fracture involving the right tibia or fibula. Degenerative changes along the medial malleolus. Calcaneal spurring particularly at the Achilles tendon insertion site. IMPRESSION: 1. No acute bone abnormality  to the right lower extremity. 2. Severe degenerative changes in the right knee. 3. Calcaneal spurring. Electronically Signed   By: Markus Daft M.D.   On: 12/10/2021 14:45   DG Femur Min 2 Views Right  Result Date: 12/10/2021 CLINICAL DATA:  Hip pain after fall EXAM: RIGHT FEMUR 2 VIEWS COMPARISON:  10/23/2021 FINDINGS: Evidence of internal fixation across previous femoral neck fracture. No visible acute fracture. No hardware complicating feature. No subluxation or dislocation. IMPRESSION: Remote posttraumatic and post surgical changes across the right femoral neck fracture. No acute bony abnormality. Electronically Signed   By: Rolm Baptise M.D.   On: 12/10/2021 14:43       Eliot Popper M.D. [facility] 12/11/2021, 10:49 AM  Available via Epic secure chat 7am-7pm After 7 pm, please refer to night coverage provider listed on amion.

## 2021-12-11 NOTE — Consult Note (Signed)
ORTHOPAEDIC CONSULTATION  REQUESTING PHYSICIAN: Mendel Corning, MD  Chief Complaint:   Right knee pain.  History of Present Illness: Stacy Grimes is a 71 y.o. female with multiple medical issues, including COPD, coronary artery disease, cardiomyopathy, sleep apnea, hypothyroidism, hypertension, anxiety/depression and osteopenia who lives independently with her husband.  The patient is now 7 weeks status post an intramedullary nailing of a displaced intertrochanteric/basicervical right hip fracture.  The patient had been healing well from this and had been at home with her husband for the past several weeks.  Apparently, while getting up to use the bedside commode, she lost her balance and fell forward onto both knees.  She complained of knee pain and therefore was brought to the emergency room where x-rays were unremarkable.  However, a subsequent CT scan demonstrated a nondisplaced crack in the distal.  The patient was admitted for pain control and probable rehab placement.  She denies any associated injuries.  She did not strike her head or lose consciousness.  She also denies any lightheadedness, dizziness, chest pain, shortness of breath, or other symptoms which may have precipitated her fall.  Past Medical History:  Diagnosis Date   AAA (abdominal aortic aneurysm) (HCC)    Anginal pain ([place])    Anxiety    Arthritis    Automatic implantable cardioverter-defibrillator in situ    Biventricular ICD  [place]    COPD (chronic obstructive pulmonary disease) (HCC)    Deafness    left ear   Dementia (HCC)    Depression    Dizziness    Dysrhythmia    Fracture    history of spinal fracture   GERD (gastroesophageal reflux disease)    HFrEF (heart failure with reduced ejection fraction) ([place])    a. 2010 Cath: nonobs dzs, EF 15-20%; b. 09/2017 Echo: EF suboptimal. EF low nl; c. 04/2018 Echo: EF 40-45%.   Hypertension     Hypothyroidism    Non-ischemic cardiomyopathy ([place])    a. 2010 Cath: nonobs dzs, EF 15-20%; b. 2011 s/p SJM CRT-D; c. 09/2016 s/p Gen change; d. 09/2017 Echo: EF suboptimal. EF low nl; e. 04/2018 Echo: EF 40-45%.   Oxygen dependent    Palpitations    Shortness of breath dyspnea    Sleep apnea    Wheezing    Past Surgical History:  Procedure Laterality Date   ABDOMINAL HYSTERECTOMY     BIV ICD GENERATOR CHANGEOUT N/A 09/17/2016   Procedure: BiV ICD Generator Changeout;  Surgeon: Deboraha Sprang, MD;  Location: [facility];  Service: Cardiovascular;  Laterality: N/A;   BLADDER SURGERY     CARDIAC CATHETERIZATION     CARDIAC DEFIBRILLATOR PLACEMENT  4 yrs ago   pacemaker   CATARACT EXTRACTION W/PHACO Left 09/01/2014   Procedure: CATARACT EXTRACTION PHACO AND INTRAOCULAR LENS PLACEMENT ([place]);  Surgeon: Lyla Glassing, MD;  Location: [facility];  Service: Ophthalmology;  Laterality: Left;  [place]: 1:12.1    CATARACT EXTRACTION W/PHACO Right 09/29/2014   Procedure: CATARACT EXTRACTION PHACO AND INTRAOCULAR LENS PLACEMENT (I[facility]);  Surgeon: Lyla Glassing, MD;  Location: [facility];  Service: Ophthalmology;  Laterality: Right;  [place]: 00:52.7    CHOLECYSTECTOMY     COLON SURGERY     COLONOSCOPY     COLONOSCOPY N/A 08/31/2012   Procedure: COLONOSCOPY;  Surgeon: Inda Castle, MD;  Location: [facility];  Service: Endoscopy;  Laterality: N/A;   ESOPHAGOGASTRODUODENOSCOPY N/A 07/06/2012   Procedure: ESOPHAGOGASTRODUODENOSCOPY (EGD);  Surgeon: Lafayette Dragon, MD;  Location:  [facility];  Service: Endoscopy;  Laterality: N/A;   INTRAMEDULLARY (IM) NAIL INTERTROCHANTERIC Right 10/23/2021   Procedure: INTRAMEDULLARY (IM) NAIL INTERTROCHANTERIC;  Surgeon: Corky Mull, MD;  Location: [facility];  Service: Orthopedics;  Laterality: Right;   KNEE SURGERY Right    MIDDLE EAR SURGERY     SAVORY DILATION N/A 07/06/2012   Procedure: SAVORY DILATION;  Surgeon: Lafayette Dragon, MD;  Location: [facility];  Service:  Endoscopy;  Laterality: N/A;   TEE WITHOUT CARDIOVERSION N/A 06/23/2020   Procedure: TRANSESOPHAGEAL ECHOCARDIOGRAM (TEE);  Surgeon: Minna Merritts, MD;  Location: [facility];  Service: Cardiovascular;  Laterality: N/A;   WRIST SURGERY Right    Social History   Socioeconomic History   Marital status: Married    Spouse name: Not on file   Number of children: 3   Years of education: Not on file   Highest education level: Not on file  Occupational History   Occupation: Retired    Fish farm manager: DISABLED  Tobacco Use   Smoking status: Former    Packs/day: 3.00    Years: 20.00    Total pack years: 60.00    Types: Cigarettes    Quit date: 02/04/2002    Years since quitting: 19.8   Smokeless tobacco: Never  Vaping Use   Vaping Use: Never used  Substance and Sexual Activity   Alcohol use: No   Drug use: No   Sexual activity: Not on file  Other Topics Concern   Not on file  Social History Narrative   Not on file   Social Determinants of Health   Financial Resource Strain: Not on file  Food Insecurity: No Food Insecurity (10/24/2021)   Hunger Vital Sign    Worried About Running Out of Food in the Last Year: Never true    Ran Out of Food in the Last Year: Never true  Transportation Needs: No Transportation Needs (10/24/2021)   PRAPARE - Hydrologist (Medical): No    Lack of Transportation (Non-Medical): No  Physical Activity: Not on file  Stress: Not on file  Social Connections: Not on file   Family History  Problem Relation Age of Onset   Hypertension Mother    Breast cancer Mother    Stroke Mother    Heart disease Father    Heart attack Sister    Hypertension Brother    Heart attack Sister    Colon cancer Brother 52   Allergies  Allergen Reactions   Codeine Palpitations   Contrast Media [Iodinated Contrast Media] Rash   Prior to Admission medications   Medication Sig Start Date End Date Taking? Authorizing Provider  allopurinol (ZYLOPRIM)  100 MG tablet Take 300 mg by mouth daily.   Yes [provider]  atorvastatin (LIPITOR) 80 MG tablet Take 1 tablet (80 mg total) by mouth daily. 07/12/21 07/07/22 Yes Wellington Hampshire, MD  budesonide-formoterol (SYMBICORT) 160-4.5 MCG/ACT inhaler Inhale 2 puffs into the lungs 2 (two) times daily.   Yes [provider]  clopidogrel (PLAVIX) 75 MG tablet Take 1 tablet (75 mg total) by mouth daily. 07/12/21  Yes Wellington Hampshire, MD  diclofenac Sodium (VOLTAREN) 1 % GEL Apply 2 g topically 4 (four) times daily. 11/15/21  Yes [provider]  digoxin (LANOXIN) 0.125 MG tablet TAKE 1 TABLET BY MOUTH ON MONDAY, [facility] AND FRIDAY 10/10/21  Yes Wellington Hampshire, MD  donepezil (ARICEPT) 10 MG tablet Take 10 mg by mouth at bedtime.  Yes [provider]  gabapentin (NEURONTIN) 400 MG capsule Take 1 capsule (400 mg total) by mouth 3 (three) times daily. 10/30/21  Yes Sreenath, Sudheer B, MD  hydrOXYzine (VISTARIL) 25 MG capsule Take 25 mg by mouth at bedtime as needed for anxiety.   Yes [provider]  levothyroxine (SYNTHROID) 112 MCG tablet Take 112 mcg by mouth daily before breakfast.   Yes [provider]  losartan (COZAAR) 25 MG tablet Take 0.5 tablets (12.5 mg total) by mouth daily. 07/12/21  Yes Wellington Hampshire, MD  memantine (NAMENDA) 10 MG tablet Take 10 mg by mouth in the morning and at bedtime. 07/10/21  Yes [provider]  metFORMIN (GLUCOPHAGE-XR) 500 MG 24 hr tablet Take 500 mg by mouth 2 (two) times daily with a meal.    Yes [provider]  methocarbamol (ROBAXIN) 750 MG tablet Take 1 tablet (750 mg total) by mouth 3 (three) times daily. 10/30/21  Yes Sreenath, Sudheer B, MD  metoprolol succinate (TOPROL-XL) 25 MG 24 hr tablet Take 25 mg by mouth daily.   Yes [provider]  mirtazapine (REMERON) 7.5 MG tablet Take 7.5 mg by mouth 2 (two) times daily. 10/10/21  Yes [provider]  omeprazole (PRILOSEC) 20 MG  capsule Take 1 capsule (20 mg total) by mouth every morning. 04/12/18  Yes Gladstone Lighter, MD  potassium chloride SA (KLOR-CON M) 20 MEQ tablet Take 1 tablet (20 mEq total) by mouth daily. 07/12/21  Yes Wellington Hampshire, MD  venlafaxine XR (EFFEXOR-XR) 75 MG 24 hr capsule Take 75 mg by mouth 2 (two) times daily. 10/21/21  Yes [provider]  vitamin B-12 (CYANOCOBALAMIN) 1000 MCG tablet Take 1,000 mcg by mouth 2 (two) times daily.    Yes [provider]  albuterol (ACCUNEB) 0.63 MG/3ML nebulizer solution Take 1 ampule by nebulization every 6 (six) hours as needed for wheezing.    [provider]  albuterol (PROVENTIL HFA;VENTOLIN HFA) 108 (90 Base) MCG/ACT inhaler Inhale 2 puffs into the lungs every 6 (six) hours as needed. 11/02/16   Orbie Pyo, MD  enoxaparin (LOVENOX) 40 MG/0.4ML injection Inject 0.4 mLs (40 mg total) into the skin daily. Patient not taking: Reported on 12/10/2021 10/29/21   Lattie Corns, PA-C  furosemide (LASIX) 20 MG tablet Take 1 tablet (20 mg total) by mouth every other day. May take an additional tablet AS NEEDED for weight gain of 3 lbs overnight or 5 lbs in one week. Do not exceed more than 3 additional doses. - Oral Patient not taking: Reported on 12/10/2021 10/10/21   Wellington Hampshire, MD  HYDROcodone-acetaminophen ([place]) 7.5-325 MG tablet Take 1-2 tablets by mouth every 4 (four) hours as needed for severe pain (pain score 7-10). Patient not taking: Reported on 12/10/2021 10/29/21   Lattie Corns, PA-C  Menthol, Topical Analgesic, (BENGAY EX) Apply 1 application topically daily as needed (back pain).    [provider]  Sennosides (EX-LAX PO) Take 2 tablets by mouth at bedtime as needed (constipation).    [provider]  tiotropium (SPIRIVA) 18 MCG inhalation capsule Place 1 capsule (18 mcg total) into inhaler and inhale daily as needed (shortness of breath). 04/12/18   Gladstone Lighter, MD   CT Knee  Right Wo Contrast  Result Date: 12/10/2021 CLINICAL DATA:  Knee trauma, fall wall standing. EXAM: CT OF THE RIGHT KNEE WITHOUT CONTRAST TECHNIQUE: Multidetector CT imaging of the right knee was performed according to the standard protocol. Multiplanar  CT image reconstructions were also generated. RADIATION DOSE REDUCTION: This exam was performed according to the departmental dose-optimization program which includes automated exposure control, adjustment of the mA and/or kV according to patient size and/or use of iterative reconstruction technique. COMPARISON:  Right leg radiographs from 12/10/2021 FINDINGS: Bones/Joint/Cartilage Distal femoral IM nail observed. Transverse fracture of the distal femoral metaphysis skirting the inferior margin of the IM nail. This fracture is mostly nondisplaced, with components visible for example on image 107 series 8, 92 series 8, and 60 of series 8. This likely extends into the anterior intercondylar trochlear groove. Other intra-articular extension is not entirely excluded. Tricompartmental osteoarthritis with severe medial compartmental articular space narrowing and tricompartmental spurring. Moderate-sized lipohemarthrosis. Ligaments Suboptimally assessed by CT. Muscles and Tendons Regional muscular atrophy. This most notably involves the semimembranosus muscle. Soft tissues No supplemental non-categorized findings. IMPRESSION: 1. Acute transverse fracture of the distal femoral metaphysis skirting the inferior margin of the IM nail. This fracture is mostly nondisplaced. This fracture likely extends into the anterior intercondylar trochlear groove. Other intra-articular extension is not entirely excluded. 2. Moderate-sized lipohemarthrosis. 3. 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 4. Regional muscular atrophy most notably involves the semimembranosus muscle. Electronically Signed   By: Van Clines M.D.   On: 12/10/2021 16:36    CT HEAD WO CONTRAST (5MM)  Result Date: 12/10/2021 CLINICAL DATA:  Fall yesterday. EXAM: CT HEAD WITHOUT CONTRAST CT CERVICAL SPINE WITHOUT CONTRAST TECHNIQUE: Multidetector CT imaging of the head and cervical spine was performed following the standard protocol without intravenous contrast. Multiplanar CT image reconstructions of the cervical spine were also generated. RADIATION DOSE REDUCTION: This exam was performed according to the departmental dose-optimization program which includes automated exposure control, adjustment of the mA and/or kV according to patient size and/or use of iterative reconstruction technique. COMPARISON:  CT head 06/21/2020 FINDINGS: CT HEAD FINDINGS Brain: There is no acute intracranial hemorrhage, extra-axial fluid collection, or acute territorial infarct. There is mild background parenchymal volume loss the ventricles are normal in size. There are numerous areas of hypodensity in the bilateral frontal and occipital lobe cortices consistent with prior infarcts, remote in appearance but some of which are new since 06/21/2020. Additional patchy hypodensity in the supratentorial white matter likely reflects sequela of chronic small-vessel ischemic change. There is no mass lesion.  There is no mass effect or midline shift. Vascular: No hyperdense vessel or unexpected calcification. Skull: Normal. Negative for fracture or focal lesion. Sinuses/Orbits: There is layering fluid in the right maxillary sinus. Bilateral lens implants are in place. The globes and orbits are otherwise unremarkable. Other: None. CT CERVICAL SPINE FINDINGS Alignment: There is reversal of the normal cervical lordosis centered at C4. There is 4-5 mm anterolisthesis of C4 on C5, likely degenerative in nature. There is no jumped or perched facet or other evidence of traumatic malalignment. Skull base and vertebrae: Skull base alignment is maintained. Vertebral body heights are preserved. There is no evidence of  acute fracture. There is no suspicious osseous lesion. Soft tissues and spinal canal: No prevertebral fluid or swelling. No visible canal hematoma. Disc levels: There is disc space narrowing and degenerative endplate change most advanced at C4-C5 through C6-C7. Facet arthropathy is most advanced on the right at C2-C3 and bilaterally at C3-C4. There is no evidence of high-grade spinal canal stenosis. Upper chest: There is emphysema in the lung apices. Other: None. IMPRESSION: 1. No acute intracranial hemorrhage or calvarial fracture. 2. 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 If there is clinical concern for acute ischemia, MRI is recommended. 3. No acute fracture or traumatic malalignment of the cervical spine. 4. Multilevel degenerative changes as above with 4-5 mm degenerative anterolisthesis of C4 on C5. Electronically Signed   By: Valetta Mole M.D.   On: 12/10/2021 16:07   CT Cervical Spine Wo Contrast  Result Date: 12/10/2021 CLINICAL DATA:  Fall yesterday. EXAM: CT HEAD WITHOUT CONTRAST CT CERVICAL SPINE WITHOUT CONTRAST TECHNIQUE: Multidetector CT imaging of the head and cervical spine was performed following the standard protocol without intravenous contrast. Multiplanar CT image reconstructions of the cervical spine were also generated. RADIATION DOSE REDUCTION: This exam was performed according to the departmental dose-optimization program which includes automated exposure control, adjustment of the mA and/or kV according to patient size and/or use of iterative reconstruction technique. COMPARISON:  CT head 06/21/2020 FINDINGS: CT HEAD FINDINGS Brain: There is no acute intracranial hemorrhage, extra-axial fluid collection, or acute territorial infarct. There is mild background parenchymal volume loss the ventricles are normal in size. There are numerous areas of hypodensity in the bilateral frontal and occipital lobe  cortices consistent with prior infarcts, remote in appearance but some of which are new since 06/21/2020. Additional patchy hypodensity in the supratentorial white matter likely reflects sequela of chronic small-vessel ischemic change. There is no mass lesion.  There is no mass effect or midline shift. Vascular: No hyperdense vessel or unexpected calcification. Skull: Normal. Negative for fracture or focal lesion. Sinuses/Orbits: There is layering fluid in the right maxillary sinus. Bilateral lens implants are in place. The globes and orbits are otherwise unremarkable. Other: None. CT CERVICAL SPINE FINDINGS Alignment: There is reversal of the normal cervical lordosis centered at C4. There is 4-5 mm anterolisthesis of C4 on C5, likely degenerative in nature. There is no jumped or perched facet or other evidence of traumatic malalignment. Skull base and vertebrae: Skull base alignment is maintained. Vertebral body heights are preserved. There is no evidence of acute fracture. There is no suspicious osseous lesion. Soft tissues and spinal canal: No prevertebral fluid or swelling. No visible canal hematoma. Disc levels: There is disc space narrowing and degenerative endplate change most advanced at C4-C5 through C6-C7. Facet arthropathy is most advanced on the right at C2-C3 and bilaterally at C3-C4. There is no evidence of high-grade spinal canal stenosis. Upper chest: There is emphysema in the lung apices. Other: None. IMPRESSION: 1. No acute intracranial hemorrhage or calvarial fracture. 2. 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 If there is clinical concern for acute ischemia, MRI is recommended. 3. No acute fracture or traumatic malalignment of the cervical spine. 4. Multilevel degenerative changes as above with 4-5 mm degenerative anterolisthesis of C4 on C5. Electronically Signed   By: Valetta Mole M.D.   On: 12/10/2021 16:07   DG  Tibia/Fibula Right  Result Date: 12/10/2021 CLINICAL DATA:  Pain after fall. EXAM: CHEST  1 VIEW; RIGHT TIBIA AND FIBULA - 2 VIEW; PELVIS - 1-2 VIEW COMPARISON:  Right femur 12/10/2021 FINDINGS: Intramedullary nail extends into the distal right femur. Severe joint space loss along the medial knee joint. Right knee appears to be located. Negative for fracture involving the right tibia or fibula. Degenerative changes along the medial malleolus. Calcaneal spurring particularly at the Achilles tendon insertion site. IMPRESSION: 1. No acute bone abnormality to the right lower extremity. 2. Severe degenerative  changes in the right knee. 3. Calcaneal spurring. Electronically Signed   By: Markus Daft M.D.   On: 12/10/2021 14:45   DG Pelvis 1-2 Views  Result Date: 12/10/2021 CLINICAL DATA:  Pain after fall. EXAM: CHEST  1 VIEW; RIGHT TIBIA AND FIBULA - 2 VIEW; PELVIS - 1-2 VIEW COMPARISON:  Right femur 12/10/2021 FINDINGS: Intramedullary nail extends into the distal right femur. Severe joint space loss along the medial knee joint. Right knee appears to be located. Negative for fracture involving the right tibia or fibula. Degenerative changes along the medial malleolus. Calcaneal spurring particularly at the Achilles tendon insertion site. IMPRESSION: 1. No acute bone abnormality to the right lower extremity. 2. Severe degenerative changes in the right knee. 3. Calcaneal spurring. Electronically Signed   By: Markus Daft M.D.   On: 12/10/2021 14:45   DG Chest 1 View  Result Date: 12/10/2021 CLINICAL DATA:  Pain after fall. EXAM: CHEST  1 VIEW; RIGHT TIBIA AND FIBULA - 2 VIEW; PELVIS - 1-2 VIEW COMPARISON:  Right femur 12/10/2021 FINDINGS: Intramedullary nail extends into the distal right femur. Severe joint space loss along the medial knee joint. Right knee appears to be located. Negative for fracture involving the right tibia or fibula. Degenerative changes along the medial malleolus. Calcaneal spurring particularly  at the Achilles tendon insertion site. IMPRESSION: 1. No acute bone abnormality to the right lower extremity. 2. Severe degenerative changes in the right knee. 3. Calcaneal spurring. Electronically Signed   By: Markus Daft M.D.   On: 12/10/2021 14:45   DG Femur Min 2 Views Right  Result Date: 12/10/2021 CLINICAL DATA:  Hip pain after fall EXAM: RIGHT FEMUR 2 VIEWS COMPARISON:  10/23/2021 FINDINGS: Evidence of internal fixation across previous femoral neck fracture. No visible acute fracture. No hardware complicating feature. No subluxation or dislocation. IMPRESSION: Remote posttraumatic and post surgical changes across the right femoral neck fracture. No acute bony abnormality. Electronically Signed   By: Rolm Baptise M.D.   On: 12/10/2021 14:43    Positive ROS: All other systems have been reviewed and were otherwise negative with the exception of those mentioned in the HPI and as above.  Physical Exam: General:  Alert, no acute distress Psychiatric:  Patient is of questionable competence for consent, but exhibits normal mood and affect   Cardiovascular:  No pedal edema Respiratory:  No wheezing, non-labored breathing GI:  Abdomen is soft and non-tender Skin:  No lesions in the area of chief complaint Neurologic:  Sensation intact distally Lymphatic:  No axillary or cervical lymphadenopathy  Orthopedic Exam:  Orthopedic examination is limited to the right.  Inspection around the right knee is notable for swelling, as well as a 1-2+ effusion.  There also are several well-healed surgical incisions along the lateral aspect right hip and thigh resulting from a recent intramedullary nailing of a displaced intertrochanteric/basicervical fracture of right hip.  No erythema, abrasions, or other skin abnormalities are identified.  She has mild-moderate tenderness to palpation around the knee and more severe pain with any attempted active or passive motion of the knee.  She is grossly neurovascularly intact  to the right lower extremity and foot.  X-rays:  X-rays of the right femur and knee are available for review and have been reviewed by myself.  These films demonstrate no evidence of any fractures demonstrate significant degenerative changes of the right knee.  The intramedullary nail appears to be in good alignment and penetration of the cortex.    A CT  scan of the right knee also is available for review and has been reviewed by myself.  This study demonstrates evidence of a completely nondisplaced fracture involving the distal portion of the intramedullary nail.  Assessment: 1.  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 2.  Status post intramedullary nailing of displaced intertrochanteric/basicervical fracture of right hip.  Plan: The treatment options have been discussed with the patient.  Given the the fact that the fracture is completely nondisplaced, there is no need for surgical intervention at this time as this fracture can be managed nonsurgically.  This especially is good for her given her multiple comorbidities.  She will be placed into a hinged knee brace with the hinges locked in extension.  She will need to remain nonweightbearing for at least 6 to 8 weeks before being to begin weightbearing.  She will require rehab placement postoperatively.  Thank you for asking me to participate in the care of this most unfortunate woman.  I will be happy to follow her with you.   Pascal Lux, MD  Beeper #:  [phone]  12/11/2021 10:31 AM

## 2021-12-11 NOTE — Progress Notes (Addendum)
Patient refused all medications and stated "She doesn't need any medicine". Re-educated the importance of taking her medication and patient still refused. I started to hang a new bag of LR and patient proceeded to rip her PIV out and said "I don't need fluids". Patient is refusing a new PIV placement.  Bishop Limbo, DNP notified.

## 2021-12-11 NOTE — Evaluation (Signed)
Occupational Therapy Evaluation Patient Details Name: Stacy Grimes MRN: [id number] DOB: [birth year] Today's Date: 12/11/2021   History of Present Illness Patient is a 71 year old female, PMH of HTN, CHF, COPD, cardiac defibrillator, DM, AAA, dementia, spinal fx, O2 use, CVA, L ear deafness and recent IM nailing of displaced intertrochanteric fracture on 10/23/2021. 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   Clinical Impression   Stacy Grimes was seen for OT evaluation this date. Prior to hospital admission, pt was recently d/c home from [place]. Pt lives with spouse per chart, pt unable to answer PLOF. Pt presents to acute OT demonstrating impaired ADL performance and functional mobility 2/2 decreased activity tolerance and functional strength/ROM/balance deficits. Pt oriented to self only, repeatedly states she is in a hotel, perseverative on watching TV.   Pt currently requires TOTAL A for mobility attempts, unable to tolerate and increased agitated with RLE movement. MAX A for LB access at bed level. Pt would benefit from skilled OT to address noted impairments and functional limitations (see below for any additional details). Upon hospital discharge, recommend STR to maximize pt safety and return to PLOF.    Recommendations for follow up therapy are one component of a multi-disciplinary discharge planning process, led by the attending physician.  Recommendations may be updated based on patient status, additional functional criteria and insurance authorization.   Follow Up Recommendations  Skilled nursing-short term rehab (<3 hours/day)    Assistance Recommended at Discharge Frequent or constant Supervision/Assistance  Patient can return home with the following Two people to help with walking and/or transfers;Two people to help with bathing/dressing/bathroom;Help with stairs or ramp for  entrance    Functional Status Assessment  Patient has had a recent decline in their functional status and demonstrates the ability to make significant improvements in function in a reasonable and predictable amount of time.  Equipment Recommendations  Hospital bed    Recommendations for Other Services       Precautions / Restrictions Precautions Precautions: Fall Required Braces or Orthoses: Other Brace Other Brace: hinged knee brace locked in extension Restrictions Weight Bearing Restrictions: Yes RLE Weight Bearing: Non weight bearing      Mobility Bed Mobility Overal bed mobility: Needs Assistance Bed Mobility: Supine to Sit     Supine to sit: Total assist, +2 for physical assistance     General bed mobility comments: Pt unable to tolerate even very gentle +2 max assist with attempt to help her to sitting.  Pt yelling and becoming agitated with multiple attempts, ultimately aborted further attempts of bed mobiltiy.    Transfers                   General transfer comment: unable/unsafe          ADL either performed or assessed with clinical judgement   ADL Overall ADL's : Needs assistance/impaired                                       General ADL Comments: MAX A don B socks at bed level. Attempted mobility with MAX x2, pt anxious and agitated, unable to tolerate      Pertinent Vitals/Pain Pain Assessment Pain Assessment: Faces Faces Pain Scale: Hurts whole lot Pain Location: R knee with mobility Pain Descriptors / Indicators: Discomfort, Grimacing, Guarding Pain Intervention(s): Limited activity  within patient's tolerance, Repositioned     Hand Dominance Right   Extremity/Trunk Assessment Upper Extremity Assessment Upper Extremity Assessment: Generalized weakness;Difficult to assess due to impaired cognition   Lower Extremity Assessment Lower Extremity Assessment: Generalized weakness;Difficult to assess due to impaired  cognition       Communication Communication Communication: No difficulties   Cognition Arousal/Alertness: Awake/alert Behavior During Therapy: Agitated Overall Cognitive Status: History of cognitive impairments - at baseline                                 General Comments: oriented to self only. repeatedly states location as hotel. does not recall fall or subsequent fracture     General Comments  Pt extremely sensitive to any movement in the R LE, tolerates little to no AA/PROM     Home Living Family/patient expects to be discharged to:: Private residence Living Arrangements: Spouse/significant other Available Help at Discharge: Family;Available 24 hours/day Type of Home: Mobile home Home Access: Ramped entrance     Home Layout: One level               Home Equipment: Conservation officer, nature (2 wheels);Cane - single point;Rollator (4 wheels);BSC/3in1;Electric scooter   Additional Comments: setup per chart from prior admission      Prior Functioning/Environment Prior Level of Function : Patient poor historian/Family not available             Mobility Comments: recently d/c from STR          OT Problem List: Decreased strength;Decreased range of motion;Decreased activity tolerance;Impaired balance (sitting and/or standing);Decreased safety awareness      OT Treatment/Interventions: Self-care/ADL training;Therapeutic exercise;Energy conservation;DME and/or AE instruction;Therapeutic activities;Patient/family education;Balance training    OT Goals(Current goals can be found in the care plan section) Acute Rehab OT Goals Patient Stated Goal: to go home OT Goal Formulation: With patient Time For Goal Achievement: 12/25/21 Potential to Achieve Goals: Fair ADL Goals Pt Will Perform Grooming: with max assist;sitting Pt Will Perform Lower Body Dressing: with mod assist;bed level;with caregiver independent in assisting Pt Will Transfer to Toilet: with mod  assist (rolling bed level)  OT Frequency: Min 2X/week    Co-evaluation PT/OT/SLP Co-Evaluation/Treatment: Yes Reason for Co-Treatment: Necessary to address cognition/behavior during functional activity;For patient/therapist safety PT goals addressed during session: Mobility/safety with mobility OT goals addressed during session: ADL's and self-care      AM-PAC OT "6 Clicks" Daily Activity     Outcome Measure Help from another person eating meals?: A Little Help from another person taking care of personal grooming?: A Lot Help from another person toileting, which includes using toliet, bedpan, or urinal?: A Lot Help from another person bathing (including washing, rinsing, drying)?: A Lot Help from another person to put on and taking off regular upper body clothing?: A Lot Help from another person to put on and taking off regular lower body clothing?: A Lot 6 Click Score: 13   End of Session    Activity Tolerance: Patient tolerated treatment well Patient left: in bed;with call bell/phone within reach  OT Visit Diagnosis: Repeated falls (R29.6);Muscle weakness (generalized) (M62.81);History of falling (Z91.81);Other abnormalities of gait and mobility (R26.89)                Time: 9741-6384 OT Time Calculation (min): 20 min Charges:  OT General Charges $OT Visit: 1 Visit OT Evaluation $OT Eval Low Complexity: 1 Low  Dessie Coma, M.S. OTR/L  12/11/21, 3:32 PM  ascom [phone]

## 2021-12-11 NOTE — TOC Progression Note (Signed)
Transition of Care [facility]) - Progression Note    Patient Details  Name: NYANA HAREN MRN: [id number] Date of Birth: 21-Aug-1950  Transition [facility] Care [facility]) CM/SW [place], RN Phone Number: 12/11/2021, 8:28 AM  Clinical Narrative:   Spoke with the patient's husband due to diagnosis of dementia  The patient will return to home, she discharged in Oct from [facility] in [place] She will get a knee brace and Injection according to her husband She has a rollator and a 3 in 1 as well as a power chair at home She will need a new Rolling walker due to hers being old and no longer safe [employer] to deliver the rolling walker to the bedisde prior to DC He just built a ramp at the house She is open with [facility] for Home health  She will continue with Home health and not go to [facility] Her husband will provide transport     Expected Discharge Plan: [place] Barriers to Discharge: Barriers Resolved  Expected Discharge Plan and Services Expected Discharge Plan: [place]   Discharge Planning Services: CM Consult   Living arrangements for the past 2 months: Single Family Home                 DME Arranged: Gilford Rile rolling DME Agency: [employer] Date DME Agency Contacted: 12/11/21 Time DME Agency Contacted: [phone] Representative spoke with at DME Agency: [place] Arranged: PT [place] Agency: [facility] Date [place]: 12/11/21 Time [place]: [phone] Representative spoke with at [place]: [place] Determinants of Health ([place]) Interventions    Readmission Risk Interventions     No data to display

## 2021-12-12 DIAGNOSIS — U071 COVID-19: Secondary | ICD-10-CM | POA: Diagnosis not present

## 2021-12-12 DIAGNOSIS — N179 Acute kidney failure, unspecified: Secondary | ICD-10-CM | POA: Diagnosis not present

## 2021-12-12 DIAGNOSIS — S72401A Unspecified fracture of lower end of right femur, initial encounter for closed fracture: Secondary | ICD-10-CM | POA: Diagnosis not present

## 2021-12-12 LAB — GLUCOSE, CAPILLARY
Glucose-Capillary: 86 mg/dL (ref 70–99)
Glucose-Capillary: 84 mg/dL (ref 70–99)

## 2021-12-12 NOTE — Progress Notes (Signed)
Physical Therapy Treatment Patient Details Name: Stacy Grimes MRN: [id number] DOB: [birth year] Today's Date: 12/12/2021   History of Present Illness Patient is a 71 year old female, PMH of HTN, CHF, COPD, cardiac defibrillator, DM, AAA, dementia, spinal fx, O2 use, CVA, L ear deafness and recent IM nailing of displaced intertrochanteric fracture on 10/23/2021. 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    PT Comments    Pt continues to be very self limiting due to pain.  She was able to show some participation with bed exercises but generally speaking needed a lot of cuing and encouragement for limited effort.  She initially agreed to try doing some bed mobility/attempt sitting but despite very gentle and guarded assist toward EOB she yelled out in pain refusing to do more mobility today.      Recommendations for follow up therapy are one component of a multi-disciplinary discharge planning process, led by the attending physician.  Recommendations may be updated based on patient status, additional functional criteria and insurance authorization.  Follow Up Recommendations  Skilled nursing-short term rehab (<3 hours/day) Can patient physically be transported by private vehicle: No   Assistance Recommended at Discharge Frequent or constant Supervision/Assistance  Patient can return home with the following Two people to help with walking and/or transfers;Two people to help with bathing/dressing/bathroom;Assistance with cooking/housework;Help with stairs or ramp for entrance;Assist for transportation   Equipment Recommendations   (TBD at rehab)    Recommendations for Other Services       Precautions / Restrictions Precautions Precautions: Fall Required Braces or Orthoses: Knee Immobilizer - Right Other Brace: hinged knee brace locked in extension Restrictions Weight Bearing  Restrictions: Yes RLE Weight Bearing: Non weight bearing     Mobility  Bed Mobility Overal bed mobility: Needs Assistance Bed Mobility: Supine to Sit     Supine to sit: Total assist     General bed mobility comments: Encouraged pt to try getting up again today, and she anwered "I reckon" when I asked if she was ready to try.  However when PT started to move R LE toward EOB (guardedly, slowly and in [place])  She unequivocally and without reservation said absolutely not and refused to try getting more to EOB despite much encouargement    Transfers                   General transfer comment: unable/unsafe    Ambulation/Gait                   Stairs             Wheelchair Mobility    Modified Rankin (Stroke Patients Only)       Balance                                            Cognition Arousal/Alertness: Lethargic Behavior During Therapy: Restless Overall Cognitive Status: History of cognitive impairments - at baseline                                          Exercises General Exercises - Lower Extremity Ankle Circles/Pumps: AROM, 15 reps Quad Sets: AROM, 10 reps, Both (poor tolerance on R, able to  do some inconsistent reps on L) Hip ABduction/ADduction: AAROM, PROM, 5 reps, AROM (pt able to do AROM on L, PROM only on R with c/o pain t/o)    General Comments        Pertinent Vitals/Pain Pain Assessment Pain Assessment: Faces Faces Pain Scale: Hurts whole lot Pain Location: pt calm at rest but yelling out in pain with all but the most limited R LE movement    Home Living                          Prior Function            PT Goals (current goals can now be found in the care plan section) Progress towards PT goals: Progressing toward goals (able to do some limited bed ex, but very pain limited)    Frequency    Min 2X/week      PT Plan Current plan remains appropriate     Co-evaluation              AM-PAC PT "6 Clicks" Mobility   Outcome Measure  Help needed turning from your back to your side while in a flat bed without using bedrails?: Total Help needed moving from lying on your back to sitting on the side of a flat bed without using bedrails?: Total Help needed moving to and from a bed to a chair (including a wheelchair)?: Total Help needed standing up from a chair using your arms (e.g., wheelchair or bedside chair)?: Total Help needed to walk in hospital room?: Total Help needed climbing 3-5 steps with a railing? : Total 6 Click Score: 6    End of Session   Activity Tolerance: Patient limited by pain Patient left: with bed alarm set;with call bell/phone within reach Nurse Communication: Mobility status PT Visit Diagnosis: Muscle weakness (generalized) (M62.81);Difficulty in walking, not elsewhere classified (R26.2);Pain;History of falling (Z91.81) Pain - Right/Left: Right Pain - part of body: Knee     Time: 1430-1446 PT Time Calculation (min) (ACUTE ONLY): 16 min  Charges:  $Therapeutic Exercise: 8-22 mins                     Kreg Shropshire, DPT 12/12/2021, 3:45 PM

## 2021-12-12 NOTE — Progress Notes (Signed)
Patient ID: Stacy Grimes, female   DOB: 10/19/1950, 71 y.o.   MRN: [id number]  Subjective: The patient notes that her knee is feeling good this morning.  She notes only mild soreness in the knee.  She denies any reinjury to the knee.   Objective: Vital signs in last 24 hours: Temp:  [98.5 F (36.9 C)-98.6 F (37 C)] 98.5 F (36.9 C) (11/08 0010) Pulse Rate:  [88-90] 90 (11/08 0010) Resp:  [18] 18 (11/08 0010) BP: (109-118)/(67-70) 118/70 (11/08 0010) SpO2:  [93 %-97 %] 97 % (11/08 0010)  Intake/Output from previous day: 11/07 0701 - 11/08 0700 In: 255.1 [I.V.:255.1] Out: -  Intake/Output this shift: No intake/output data recorded.  Recent Labs    12/10/21 1307 12/11/21 0721  HGB 10.9* 11.2*   Recent Labs    12/10/21 1307 12/11/21 0721  WBC 9.3 7.8  RBC 4.30 4.38  HCT 36.0 36.4  PLT 175 164   Recent Labs    12/10/21 1307 12/11/21 0721  NA 139 140  K 5.0 4.5  CL 108 111  CO2 23 22  BUN 18 20  CREATININE 1.39* 1.38*  GLUCOSE 121* 123*  CALCIUM 8.1* 8.2*   No results for input(s): "LABPT", "INR" in the last 72 hours.  Physical Exam: Orthopedic examination again is limited to the right knee and lower extremity.  The leg is in a brace maintaining the knee in extension.  Mild to moderate swelling persists around the knee.  She is slowly neurovascularly intact to the right lower extremity and foot.  Assessment: 1.  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 2.  Status post intramedullary nailing of displaced intertrochanteric/basicervical fracture of right hip.  Plan: The treatment options are reviewed with the patient.  The patient may be mobilized with physical therapy, so long as she is in her hinged knee brace with the hinges locked in extension.  She will remain nonweightbearing on the right leg.  She will need rehab placement following this hospitalization.   [name] 12/12/2021, 7:50 AM

## 2021-12-12 NOTE — TOC Progression Note (Signed)
Transition of Care [facility]) - Progression Note    Patient Details  Name: Stacy Grimes MRN: [id number] Date of Birth: 09-Mar-1950  Transition of Care [facility]) CM/SW [place], RN Phone Number: 12/12/2021, 12:45 PM  Clinical Narrative:    Husband called and asked about a Aide at home, I explained to him that Ins does not cvoer PCS services, I explained that there are several companies in town that do it but it is private pay He stated understanding  I explained what exactly [facility] entailed HH PT, OT and aide just for bathing He requested to have her get an injection in the knee prior to her going home, I let home know that I would let the physicians know of his request, I notified the Physician   Expected Discharge Plan: [place] Barriers to Discharge: Barriers Resolved  Expected Discharge Plan and Services Expected Discharge Plan: [place]   Discharge Planning Services: CM Consult   Living arrangements for the past 2 months: Single Family Home                 DME Arranged: Walker rolling DME Agency: [employer] Date DME Agency Contacted: 12/11/21 Time DME Agency Contacted: [phone] Representative spoke with at DME Agency: [place]: PT [place] Agency: [facility] Date [place]: 12/11/21 Time [place]: [phone] Representative spoke with at [place]: [employer] ([place]) Interventions    Readmission Risk Interventions     No data to display

## 2021-12-12 NOTE — Progress Notes (Signed)
Progress Note    Stacy Grimes   WYO:[id number]  DOB: [birth year]  DOA: 12/10/2021     2 PCP: Cyndi Bender, PA-C  Initial CC: [facility] Course: Stacy Grimes is a 71 year old female with PMH dementia, COPD, CAD, cardiomyopathy, OSA, hypothyroidism, HTN, anxiety, osteopenia. She presented after a fall.  She recently underwent ORIF of a right hip fracture on 10/23/2021.  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 She was evaluated by orthopedic surgery on admission and recommended for nonweightbearing of the right lower extremity.  Rehab was recommended however patient's husband declined and wished to bring patient home at time of discharge.  Interval History:  No events overnight.  Husband present bedside this morning.  He still declines taking patient to rehab and wishes to bring her home.  Despite being two-person assist, he states he will be able to take care of her.  Assessment and Plan: * Femur fracture ([place]) - s/p recurrent fall; CT shows acute transverse fracture of the distal femoral metaphysis, nondisplaced -Appreciate evaluation by orthopedic surgery.  Recommendation is for nonweightbearing status.  No surgical intervention recommended at this time -Continue pain control - PT recommending rehab due to two-person assist however husband wishes to bring patient home and is declining rehab  Fall - Husband declining rehab at discharge  COVID-19 virus infection - Unclear exact timeline however husband endorses that she tested positive for COVID while at rehab after last discharge -Outside treatment course window and per husband was already treated - Likely still within 21-day window for [place] quarantine.  Continue isolation until discharge  AKI (acute kidney injury) ([place]) - baseline creatinine ~ 0.9 - patient  presents with increase in creat >0.3 mg/dL above baseline, creat increase >1.5x baseline presumed to have occurred within past 7 days PTA -Suspected due to poor oral intake - S/p fluids - BMP in a.m.  Dilated cardiomyopathy ([place]) Euvolemic at this time. - Holding home furosemide, losartan in the setting of AKI - Continue home digoxin 3 times weekly, and daily metoprolol  Dementia ([place]) Per chart review, patient has a history of dementia for which she is currently on donepezil and memantine.  She is only oriented to person at this time, which is patient's husband states is her baseline. - Continue donepezil and memantine - Delirium precautions  Type 2 diabetes mellitus with hyperglycemia, unspecified whether long term insulin use (HCC) - SSI, moderate - Holding home metformin   Old records reviewed in assessment of this patient  Antimicrobials:   DVT prophylaxis:  enoxaparin (LOVENOX) injection 40 mg Start: 12/11/21 1200   Code Status:   Code Status: Full Code  Mobility Assessment (last 72 hours)     Mobility Assessment     Row Name 12/12/21 1500 12/11/21 2230 12/11/21 1502 12/11/21 1500     Does patient have an order for bedrest or is patient medically unstable -- Yes- Bedfast (Level 1) - Complete -- --    What is the highest level of mobility based on the progressive mobility assessment? Level 1 (Bedfast) - Unable to balance while sitting on edge of bed -- Level 1 (Bedfast) - Unable to balance while sitting on edge of bed Level 1 (Bedfast) - Unable to balance while sitting on edge of bed             Barriers to discharge:  Disposition  Plan:  Home Status is: Inpt  Objective: Blood pressure (!) 140/76, pulse 95, temperature 97.7 F (36.5 C), resp. rate 20, height 5\' 3"  (1.6 m), weight 81.2 kg, SpO2 94 %.  Examination:  Physical Exam Constitutional:      Comments: Chronically ill-appearing elderly woman lying in bed in no distress with underlying dementia  appreciated  HENT:     Head: Normocephalic and atraumatic.     Mouth/Throat:     Mouth: Mucous membranes are dry.  Eyes:     Extraocular Movements: Extraocular movements intact.  Cardiovascular:     Rate and Rhythm: Normal rate and regular rhythm.  Pulmonary:     Effort: Pulmonary effort is normal.     Breath sounds: Normal breath sounds.  Abdominal:     General: Bowel sounds are normal. There is no distension.     Palpations: Abdomen is soft.     Tenderness: There is no abdominal tenderness.  Musculoskeletal:     Cervical back: Normal range of motion and neck supple.     Comments: RLE brace in place   Skin:    General: Skin is warm and dry.  Neurological:     Comments: Dementia appreciated; moves all 4 extremities      Consultants:  Orthopedic surgery  Procedures:    Data Reviewed: Results for orders placed or performed during the hospital encounter of 12/10/21 (from the past 24 hour(s))  Glucose, capillary     Status: Abnormal   Collection Time: 12/11/21  5:11 PM  Result Value Ref Range   Glucose-Capillary 108 (H) 70 - 99 mg/dL  Glucose, capillary     Status: Abnormal   Collection Time: 12/11/21 10:04 PM  Result Value Ref Range   Glucose-Capillary 113 (H) 70 - 99 mg/dL  Glucose, capillary     Status: None   Collection Time: 12/12/21  9:00 AM  Result Value Ref Range   Glucose-Capillary 86 70 - 99 mg/dL  Glucose, capillary     Status: None   Collection Time: 12/12/21 12:07 PM  Result Value Ref Range   Glucose-Capillary 84 70 - 99 mg/dL    I have Reviewed nursing notes, Vitals, and Lab results since pt's last encounter. Pertinent lab results : see above I have ordered test including BMP, CBC, Mg I have reviewed the last note from staff over past 24 hours I have discussed pt's care plan and test results with nursing staff, case manager   LOS: 2 days   Dwyane Dee, MD [facility] 12/12/2021, 4:46 PM

## 2021-12-12 NOTE — Hospital Course (Signed)
Stacy Grimes is a 71 year old female with PMH dementia, COPD, CAD, cardiomyopathy, OSA, hypothyroidism, HTN, anxiety, osteopenia. She presented after a fall.  She recently underwent ORIF of a right hip fracture on 10/23/2021.  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 She was evaluated by orthopedic surgery on admission and recommended for nonweightbearing of the right lower extremity.  Rehab was recommended however patient's husband declined and wished to bring patient home at time of discharge.

## 2021-12-12 NOTE — Plan of Care (Signed)
  Problem: Education: Goal: Ability to describe self-care measures that may prevent or decrease complications (Diabetes Survival Skills Education) will improve Outcome: Not Progressing   Problem: Health Behavior/Discharge Planning: Goal: Ability to manage health-related needs will improve Outcome: Not Progressing   Problem: Activity: Goal: Risk for activity intolerance will decrease Outcome: Not Progressing   Problem: Coping: Goal: Ability to adjust to condition or change in health will improve Outcome: Progressing   Problem: Health Behavior/Discharge Planning: Goal: Ability to identify and utilize available resources and services will improve Outcome: Progressing Goal: Ability to manage health-related needs will improve Outcome: Progressing   Problem: Nutritional: Goal: Maintenance of adequate nutrition will improve Outcome: Progressing Goal: Progress toward achieving an optimal weight will improve Outcome: Progressing   Problem: Skin Integrity: Goal: Risk for impaired skin integrity will decrease Outcome: Progressing   Problem: Education: Goal: Knowledge of General Education information will improve Description: Including pain rating scale, medication(s)/side effects and non-pharmacologic comfort measures Outcome: Progressing   Problem: Clinical Measurements: Goal: Will remain free from infection Outcome: Progressing   Problem: Nutrition: Goal: Adequate nutrition will be maintained Outcome: Progressing   Problem: Coping: Goal: Level of anxiety will decrease Outcome: Progressing   Problem: Pain Managment: Goal: General experience of comfort will improve Outcome: Progressing   Problem: Safety: Goal: Ability to remain free from injury will improve Outcome: Progressing

## 2021-12-13 DIAGNOSIS — S72401A Unspecified fracture of lower end of right femur, initial encounter for closed fracture: Secondary | ICD-10-CM | POA: Diagnosis not present

## 2021-12-13 LAB — BASIC METABOLIC PANEL
Anion gap: 8 (ref 5–15)
BUN: 20 mg/dL (ref 8–23)
CO2: 25 mmol/L (ref 22–32)
Calcium: 8.4 mg/dL — ABNORMAL LOW (ref 8.9–10.3)
Chloride: 108 mmol/L (ref 98–111)
Creatinine, Ser: 1.07 mg/dL — ABNORMAL HIGH (ref 0.44–1.00)
GFR, Estimated: 55 mL/min — ABNORMAL LOW (ref >60)
Glucose, Bld: 98 mg/dL (ref 70–99)
Potassium: 4.4 mmol/L (ref 3.5–5.1)
Sodium: 141 mmol/L (ref 135–145)

## 2021-12-13 MED ORDER — ALLOPURINOL 100 MG PO TABS: 50.0000 mg | ORAL_TABLET | Freq: Every day | ORAL | 3 refills | Status: DC

## 2021-12-13 NOTE — TOC Progression Note (Signed)
Transition of Care [facility]) - Progression Note    Patient Details  Name: Stacy Grimes MRN: [id number] Date of Birth: 1951/01/05  Transition of Care [facility]) CM/SW [place], RN Phone Number: 12/13/2021, 12:22 PM  Clinical Narrative:     Spoke with the patient's husband several times, he requested that she be transported home with EMS , I explained that Insurance may not cover it, he stated understanding and wants it anyway He stated that she will not be able to get into a car  He is aware that she will go home today  Expected Discharge Plan: [place] Barriers to Discharge: Barriers Resolved  Expected Discharge Plan and Services Expected Discharge Plan: [place]   Discharge Planning Services: CM Consult   Living arrangements for the past 2 months: Single Family Home Expected Discharge Date: 12/13/21               DME Arranged: Gilford Rile rolling DME Agency: [employer] Date DME Agency Contacted: 12/11/21 Time DME Agency Contacted: [phone] Representative spoke with at DME Agency: [place]: PT [place] Agency: [facility] Date [place]: 12/11/21 Time [place]: [phone] Representative spoke with at [place]: [place] ([place]) Interventions    Readmission Risk Interventions     No data to display

## 2021-12-13 NOTE — Care Management Important Message (Signed)
Important Message  Patient Details  Name: Stacy Grimes MRN: [id number] Date of Birth: [birth year]   Medicare Important Message Given:  N/A - LOS <3 / Initial given by admissions     Juliann Pulse A Dwanda Tufano 12/13/2021, 9:09 AM

## 2021-12-13 NOTE — Progress Notes (Signed)
Occupational Therapy Treatment Patient Details Name: Stacy Grimes MRN: [id number] DOB: 1950-04-01 Today's Date: 12/13/2021   History of present illness Patient is a 71 year old female, PMH of HTN, CHF, COPD, cardiac defibrillator, DM, AAA, dementia, spinal fx, O2 use, CVA, L ear deafness and recent IM nailing of displaced intertrochanteric fracture on 10/23/2021. 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   OT comments  Stacy Grimes is oriented to self only today; she states she is at home, she has no recall of fall/RLE fx, not oriented to time. She is able and will to roll in bed, moving UE and upper body. She becomes highly agitated with any attempt to move RLE, cries out with pain. Max A for LB dressing. Recommend SNF post DC.   Recommendations for follow up therapy are one component of a multi-disciplinary discharge planning process, led by the attending physician.  Recommendations may be updated based on patient status, additional functional criteria and insurance authorization.    Follow Up Recommendations  Skilled nursing-short term rehab (<3 hours/day)    Assistance Recommended at Discharge Frequent or constant Supervision/Assistance  Patient can return home with the following  Two people to help with walking and/or transfers;Two people to help with bathing/dressing/bathroom;Help with stairs or ramp for entrance;Direct supervision/assist for medications management;Assist for transportation   Equipment Recommendations  Hospital bed    Recommendations for Other Services      Precautions / Restrictions Precautions Precautions: Fall Required Braces or Orthoses: Knee Immobilizer - Right Knee Immobilizer - Right: On at all times Other Brace: hinged knee brace locked in extension Restrictions Weight Bearing Restrictions: Yes RLE Weight Bearing: Non weight bearing        Mobility Bed Mobility Overal bed mobility: Needs Assistance Bed Mobility: Rolling, Supine to Sit, Sit to Supine Rolling: Supervision   Supine to sit: Max assist Sit to supine: Max assist   General bed mobility comments: Pt is able to roll to L and R with cueing only. Requires Max A for any movement of R LE    Transfers                   General transfer comment: unable/unsafe     Balance Overall balance assessment: Needs assistance   Sitting balance-Stacy Grimes Scale: Poor       Standing balance-Stacy Grimes Scale: Zero                             ADL either performed or assessed with clinical judgement   ADL Overall ADL's : Needs assistance/impaired                     Lower Body Dressing: Maximal assistance Lower Body Dressing Details (indicate cue type and reason): Max A for donning socks                    Extremity/Trunk Assessment Upper Extremity Assessment Upper Extremity Assessment: Generalized weakness   Lower Extremity Assessment Lower Extremity Assessment: Generalized weakness        Vision       Perception     Praxis      Cognition Arousal/Alertness: Awake/alert Behavior During Therapy: WFL for tasks assessed/performed Overall Cognitive Status: History of cognitive impairments - at baseline  General Comments: oriented to self only. states she is at home. does not recall fall/fx        Exercises Other Exercises Other Exercises: Educ re: importance of movement, repositioning. Cognitive re-orientation    Shoulder Instructions       General Comments      Pertinent Vitals/ Pain       Pain Assessment Faces Pain Scale: Hurts whole lot Pain Location: pt comfortable and calm at rest but yells out in pain with any movement of RLE Pain Descriptors / Indicators: Grimacing, Guarding Pain Intervention(s): Limited activity within patient's tolerance, Repositioned  Home  Living                                          Prior Functioning/Environment              Frequency  Min 2X/week        Progress Toward Goals  OT Goals(current goals can now be found in the care plan section)  Progress towards OT goals: Progressing toward goals  Acute Rehab OT Goals OT Goal Formulation: With patient Time For Goal Achievement: 12/25/21 Potential to Achieve Goals: Veteran Discharge plan remains appropriate;Frequency remains appropriate    Co-evaluation                 AM-PAC OT "6 Clicks" Daily Activity     Outcome Measure   Help from another person eating meals?: A Little Help from another person taking care of personal grooming?: A Lot Help from another person toileting, which includes using toliet, bedpan, or urinal?: A Lot Help from another person bathing (including washing, rinsing, drying)?: A Lot Help from another person to put on and taking off regular upper body clothing?: A Lot Help from another person to put on and taking off regular lower body clothing?: A Lot 6 Click Score: 13    End of Session    OT Visit Diagnosis: Repeated falls (R29.6);Muscle weakness (generalized) (M62.81);History of falling (Z91.81);Other abnormalities of gait and mobility (R26.89)   Activity Tolerance Patient limited by pain   Patient Left in bed;with call bell/phone within reach;with bed alarm set   Nurse Communication          Time: 5409-8119 OT Time Calculation (min): 11 min  Charges: OT General Charges $OT Visit: 1 Visit OT Treatments $Self Care/Home Management : 8-22 mins  Josiah Lobo, PhD, MS, OTR/L 12/13/21, 3:32 PM

## 2021-12-13 NOTE — Discharge Summary (Signed)
Physician Discharge Summary   AIRABELLA BARLEY KGM:[id number] DOB: 07/01/1950 DOA: 12/10/2021  PCP: Cyndi Bender, PA-C  Admit date: 12/10/2021 Discharge date:  12/13/2021 Barriers to discharge: none  Admitted From: Home Disposition:  Home Discharging physician: Dwyane Dee, MD  Recommendations for Outpatient Follow-up:  Consider SNF if re-admitted given high risk for readmission; husband declined SNF  Home Health: [facility] Equipment/Devices: RW  Discharge Condition: stable CODE STATUS: Full Diet recommendation:  Diet Orders (From admission, onward)     Start     Ordered   12/13/21 0000  Diet general        12/13/21 1207   12/10/21 1914  Diet regular Room service appropriate? Yes; Fluid consistency: Thin  Diet effective now       Question Answer Comment  Room service appropriate? Yes   Fluid consistency: Thin      12/10/21 1914            Hospital Course: Ms. Maxfield is a 71 year old female with PMH dementia, COPD, CAD, cardiomyopathy, OSA, hypothyroidism, HTN, anxiety, osteopenia. She presented after a fall.  She recently underwent ORIF of a right hip fracture on 10/23/2021.  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 She was evaluated by orthopedic surgery on admission and recommended for nonweightbearing of the right lower extremity.  Rehab was recommended however patient's husband declined and wished to bring patient home at time of discharge.  Assessment and Plan: * Femur fracture ([place]) - s/p recurrent fall; CT shows acute transverse fracture of the distal femoral metaphysis, nondisplaced -Appreciate evaluation by orthopedic surgery.  Recommendation is for nonweightbearing status.  No surgical intervention recommended at this time -Continue pain control - PT recommending rehab due to two-person assist however  husband wishes to bring patient home and is declining rehab  Fall - Husband declining rehab at discharge  COVID-19 virus infection - Unclear exact timeline however husband endorses that she tested positive for COVID while at rehab after last discharge -Outside treatment course window and per husband was already treated - Likely still within 21-day window for [place] quarantine.  Continue isolation until discharge  AKI (acute kidney injury) (HCC)-resolved as of 12/13/2021 - baseline creatinine ~ 0.9 - patient presents with increase in creat >0.3 mg/dL above baseline, creat increase >1.5x baseline presumed to have occurred within past 7 days PTA -Suspected due to poor oral intake - S/p fluids -Creatinine improved, 1.07 at discharge  Dilated cardiomyopathy ([place]) Euvolemic at this time. - Holding home furosemide, losartan in the setting of AKI - Continue home digoxin 3 times weekly, and daily metoprolol  Dementia ([place]) Per chart review, patient has a history of dementia for which she is currently on donepezil and memantine.  She is only oriented to person at this time, which is patient's husband states is her baseline. - Continue donepezil and memantine - Delirium precautions  Type 2 diabetes mellitus with hyperglycemia, unspecified whether long term insulin use (HCC) - Metformin resumed at discharge.  If GFR worsens in the future, may need to discontinue metformin       The patient's chronic medical conditions were treated accordingly per the patient's home medication regimen except as noted.  On day of discharge, patient was felt deemed stable for discharge. Patient/family member advised to call PCP or come back to ER if needed.   Principal Diagnosis: Femur fracture [facility])  Discharge  Diagnoses: Active Hospital Problems   Diagnosis Date Noted   Femur fracture ([place]) 12/10/2021    Priority: 1.   Fall 12/10/2021    Priority: 2.   COVID-19 virus infection 12/10/2021     Priority: 3.   Dilated cardiomyopathy ([place]) 03/23/2009    Priority: 5.   Dementia ([place]) 12/10/2021    Priority: 6.   Type 2 diabetes mellitus with hyperglycemia, unspecified whether long term insulin use ([place]) 06/20/2020    Priority: 7.    Resolved Hospital Problems   Diagnosis Date Noted Date Resolved   AKI (acute kidney injury) ([place]) 12/10/2021 12/13/2021    Priority: 4.     Discharge Instructions     Diet general   Complete by: As directed    Discharge instructions   Complete by: As directed    Continue non weight bearing to right lower extremity until cleared by orthopedic surgery      Allergies as of 12/13/2021       Reactions   Codeine Palpitations   Contrast Media [iodinated Contrast Media] Rash        Medication List     STOP taking these medications    enoxaparin 40 MG/0.4ML injection Commonly known as: LOVENOX   HYDROcodone-acetaminophen 7.5-325 MG tablet Commonly known as: [place] these medications    albuterol 0.63 MG/3ML nebulizer solution Commonly known as: ACCUNEB Take 1 ampule by nebulization every 6 (six) hours as needed for wheezing.   albuterol 108 (90 Base) MCG/ACT inhaler Commonly known as: VENTOLIN HFA Inhale 2 puffs into the lungs every 6 (six) hours as needed.   allopurinol 100 MG tablet Commonly known as: ZYLOPRIM Take 0.5 tablets (50 mg total) by mouth daily. What changed: how much to take   atorvastatin 80 MG tablet Commonly known as: Lipitor Take 1 tablet (80 mg total) by mouth daily.   BENGAY EX Apply 1 application topically daily as needed (back pain).   budesonide-formoterol 160-4.5 MCG/ACT inhaler Commonly known as: SYMBICORT Inhale 2 puffs into the lungs 2 (two) times daily.   clopidogrel 75 MG tablet Commonly known as: PLAVIX Take 1 tablet (75 mg total) by mouth daily.   cyanocobalamin 1000 MCG tablet Commonly known as: VITAMIN B12 Take 1,000 mcg by mouth 2 (two) times daily.   diclofenac Sodium  1 % Gel Commonly known as: VOLTAREN Apply 2 g topically 4 (four) times daily.   digoxin 0.125 MG tablet Commonly known as: LANOXIN TAKE 1 TABLET BY MOUTH ON MONDAY, WEDNESDAY AND FRIDAY   donepezil 10 MG tablet Commonly known as: ARICEPT Take 10 mg by mouth at bedtime.   EX-LAX PO Take 2 tablets by mouth at bedtime as needed (constipation).   furosemide 20 MG tablet Commonly known as: LASIX Take 1 tablet (20 mg total) by mouth every other day. May take an additional tablet AS NEEDED for weight gain of 3 lbs overnight or 5 lbs in one week. Do not exceed more than 3 additional doses. - Oral   gabapentin 400 MG capsule Commonly known as: NEURONTIN Take 1 capsule (400 mg total) by mouth 3 (three) times daily.   hydrOXYzine 25 MG capsule Commonly known as: VISTARIL Take 25 mg by mouth at bedtime as needed for anxiety.   levothyroxine 112 MCG tablet Commonly known as: SYNTHROID Take 112 mcg by mouth daily before breakfast.   losartan 25 MG tablet Commonly known as: COZAAR Take 0.5 tablets (12.5 mg total) by mouth daily.  memantine 10 MG tablet Commonly known as: NAMENDA Take 10 mg by mouth in the morning and at bedtime.   metFORMIN 500 MG 24 hr tablet Commonly known as: GLUCOPHAGE-XR Take 500 mg by mouth 2 (two) times daily with a meal.   methocarbamol 750 MG tablet Commonly known as: ROBAXIN Take 1 tablet (750 mg total) by mouth 3 (three) times daily.   metoprolol succinate 25 MG 24 hr tablet Commonly known as: TOPROL-XL Take 25 mg by mouth daily.   mirtazapine 7.5 MG tablet Commonly known as: REMERON Take 7.5 mg by mouth 2 (two) times daily.   omeprazole 20 MG capsule Commonly known as: PRILOSEC Take 1 capsule (20 mg total) by mouth every morning.   potassium chloride SA 20 MEQ tablet Commonly known as: KLOR-CON M Take 1 tablet (20 mEq total) by mouth daily.   tiotropium 18 MCG inhalation capsule Commonly known as: SPIRIVA Place 1 capsule (18 mcg total)  into inhaler and inhale daily as needed (shortness of breath).   venlafaxine XR 75 MG 24 hr capsule Commonly known as: EFFEXOR-XR Take 75 mg by mouth 2 (two) times daily.               Durable Medical Equipment  (From admission, onward)           Start     Ordered   12/11/21 0834  For home use only DME Walker rolling  Once       Question Answer Comment  Walker: With [place]   Patient needs a walker to treat with the following condition Impaired mobility      12/11/21 0834            Allergies  Allergen Reactions   Codeine Palpitations   Contrast Media [Iodinated Contrast Media] Rash    Consultations:   Procedures:   Discharge Exam: BP (!) 104/59 (BP Location: Right Arm)   Pulse 99   Temp 98.1 F (36.7 C) (Oral)   Resp 17   Ht 5\' 3"  (1.6 m)   Wt 81.2 kg Comment: bed scale  SpO2 98%   BMI 31.71 kg/m  Physical Exam Constitutional:      Comments: Chronically ill-appearing elderly woman lying in bed in no distress with underlying dementia appreciated  HENT:     Head: Normocephalic and atraumatic.     Mouth/Throat:     Mouth: Mucous membranes are dry.  Eyes:     Extraocular Movements: Extraocular movements intact.  Cardiovascular:     Rate and Rhythm: Normal rate and regular rhythm.  Pulmonary:     Effort: Pulmonary effort is normal.     Breath sounds: Normal breath sounds.  Abdominal:     General: Bowel sounds are normal. There is no distension.     Palpations: Abdomen is soft.     Tenderness: There is no abdominal tenderness.  Musculoskeletal:     Cervical back: Normal range of motion and neck supple.     Comments: RLE brace in place   Skin:    General: Skin is warm and dry.  Neurological:     Comments: Dementia appreciated; moves all 4 extremities      The results of significant diagnostics from this hospitalization (including imaging, microbiology, ancillary and laboratory) are listed below for reference.    Microbiology: Recent Results (from the past 240 hour(s))  SARS Coronavirus 2 by RT PCR (hospital order, performed in [facility] hospital lab) *cepheid single result test* Anterior Nasal Swab  Status: Abnormal   Collection Time: 12/10/21  6:49 PM   Specimen: Anterior Nasal Swab  Result Value Ref Range Status   SARS Coronavirus 2 by RT PCR POSITIVE (A) NEGATIVE Final    Comment: (NOTE) SARS-CoV-2 target nucleic acids are DETECTED  SARS-CoV-2 RNA is generally detectable in upper respiratory specimens  during the acute phase of infection.  Positive results are indicative  of the presence of the identified virus, but do not rule out bacterial infection or co-infection with other pathogens not detected by the test.  Clinical correlation with patient history and  other diagnostic information is necessary to determine patient infection status.  The expected result is negative.  Fact Sheet for Patients:   https://www.patel.info/   Fact Sheet for Healthcare Providers:   https://hall.com/    This test is not yet approved or cleared by the [place] FDA and  has been authorized for detection and/or diagnosis of SARS-CoV-2 by FDA under an Emergency Use Authorization (EUA).  This EUA will remain in effect (meaning this test can be used) for the duration of  the COVID-19 declaration under Section 564(b)(1)  of the Act, 21 U.S.C. section 360-bbb-3(b)(1), unless the authorization is terminated or revoked sooner.   Performed at [facility], [address] [place], [place] [address]      Labs: BNP (last 3 results) Recent Labs    10/23/21 1939 12/10/21 1322  BNP 316.7* 66.4   Basic Metabolic Panel: Recent Labs  Lab 12/10/21 1307 12/11/21 0721 12/13/21 0953  NA 139 140 141  K 5.0 4.5 4.4  CL 108 111 108  CO2 23 22 25   GLUCOSE 121* 123* 98  BUN 18 20 20   CREATININE 1.39* 1.38* 1.07*  CALCIUM 8.1* 8.2* 8.4*   Liver  Function Tests: Recent Labs  Lab 12/10/21 1307  AST 19  ALT 11  ALKPHOS 176*  BILITOT 0.8  PROT 7.5  ALBUMIN 3.1*   No results for input(s): "LIPASE", "AMYLASE" in the last 168 hours. No results for input(s): "AMMONIA" in the last 168 hours. CBC: Recent Labs  Lab 12/10/21 1307 12/11/21 0721  WBC 9.3 7.8  NEUTROABS 7.0 5.9  HGB 10.9* 11.2*  HCT 36.0 36.4  MCV 83.7 83.1  PLT 175 164   Cardiac Enzymes: No results for input(s): "CKTOTAL", "CKMB", "CKMBINDEX", "TROPONINI" in the last 168 hours. BNP: Invalid input(s): "POCBNP" CBG: Recent Labs  Lab 12/11/21 1212 12/11/21 1711 12/11/21 2204 12/12/21 0900 12/12/21 1207  GLUCAP 97 108* 113* 86 84   D-Dimer No results for input(s): "DDIMER" in the last 72 hours. Hgb A1c No results for input(s): "HGBA1C" in the last 72 hours. Lipid Profile No results for input(s): "CHOL", "HDL", "LDLCALC", "TRIG", "CHOLHDL", "LDLDIRECT" in the last 72 hours. Thyroid function studies No results for input(s): "TSH", "T4TOTAL", "T3FREE", "THYROIDAB" in the last 72 hours.  Invalid input(s): "FREET3" Anemia work up No results for input(s): "VITAMINB12", "FOLATE", "FERRITIN", "TIBC", "IRON", "RETICCTPCT" in the last 72 hours. Urinalysis    Component Value Date/Time   COLORURINE YELLOW (A) 12/10/2021 1652   APPEARANCEUR HAZY (A) 12/10/2021 1652   APPEARANCEUR Clear 07/31/2013 1658   LABSPEC 1.014 12/10/2021 1652   LABSPEC 1.018 07/31/2013 1658   PHURINE 5.0 12/10/2021 [place] 12/10/2021 1652   GLUCOSEU Negative 07/31/2013 1658   HGBUR NEGATIVE 12/10/2021 1652   BILIRUBINUR NEGATIVE 12/10/2021 1652   BILIRUBINUR Negative 07/31/2013 [place] 12/10/2021 1652   PROTEINUR NEGATIVE 12/10/2021 1652   UROBILINOGEN  0.2 03/28/2012 1526   NITRITE NEGATIVE 12/10/2021 1652   LEUKOCYTESUR NEGATIVE 12/10/2021 1652   LEUKOCYTESUR Negative 07/31/2013 1658   Sepsis Labs Recent Labs  Lab 12/10/21 1307  12/11/21 0721  WBC 9.3 7.8   Microbiology Recent Results (from the past 240 hour(s))  SARS Coronavirus 2 by RT PCR (hospital order, performed in [facility] hospital lab) *cepheid single result test* Anterior Nasal Swab     Status: Abnormal   Collection Time: 12/10/21  6:49 PM   Specimen: Anterior Nasal Swab  Result Value Ref Range Status   SARS Coronavirus 2 by RT PCR POSITIVE (A) NEGATIVE Final    Comment: (NOTE) SARS-CoV-2 target nucleic acids are DETECTED  SARS-CoV-2 RNA is generally detectable in upper respiratory specimens  during the acute phase of infection.  Positive results are indicative  of the presence of the identified virus, but do not rule out bacterial infection or co-infection with other pathogens not detected by the test.  Clinical correlation with patient history and  other diagnostic information is necessary to determine patient infection status.  The expected result is negative.  Fact Sheet for Patients:   https://www.patel.info/   Fact Sheet for Healthcare Providers:   https://hall.com/    This test is not yet approved or cleared by the [place] FDA and  has been authorized for detection and/or diagnosis of SARS-CoV-2 by FDA under an Emergency Use Authorization (EUA).  This EUA will remain in effect (meaning this test can be used) for the duration of  the COVID-19 declaration under Section 564(b)(1)  of the Act, 21 U.S.C. section 360-bbb-3(b)(1), unless the authorization is terminated or revoked sooner.   Performed at [facility], [address] [place], [place] [address]     Procedures/Studies: CT Knee Right Wo Contrast  Result Date: 12/10/2021 CLINICAL DATA:  Knee trauma, fall wall standing. EXAM: CT OF THE RIGHT KNEE WITHOUT CONTRAST TECHNIQUE: Multidetector CT imaging of the right knee was performed according to the standard protocol. Multiplanar CT image reconstructions were also  generated. RADIATION DOSE REDUCTION: This exam was performed according to the departmental dose-optimization program which includes automated exposure control, adjustment of the mA and/or kV according to patient size and/or use of iterative reconstruction technique. COMPARISON:  Right leg radiographs from 12/10/2021 FINDINGS: Bones/Joint/Cartilage Distal femoral IM nail observed. Transverse fracture of the distal femoral metaphysis skirting the inferior margin of the IM nail. This fracture is mostly nondisplaced, with components visible for example on image 107 series 8, 92 series 8, and 60 of series 8. This likely extends into the anterior intercondylar trochlear groove. Other intra-articular extension is not entirely excluded. Tricompartmental osteoarthritis with severe medial compartmental articular space narrowing and tricompartmental spurring. Moderate-sized lipohemarthrosis. Ligaments Suboptimally assessed by CT. Muscles and Tendons Regional muscular atrophy. This most notably involves the semimembranosus muscle. Soft tissues No supplemental non-categorized findings. IMPRESSION: 1. Acute transverse fracture of the distal femoral metaphysis skirting the inferior margin of the IM nail. This fracture is mostly nondisplaced. This fracture likely extends into the anterior intercondylar trochlear groove. Other intra-articular extension is not entirely excluded. 2. Moderate-sized lipohemarthrosis. 3. 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 4. Regional muscular atrophy most notably involves the semimembranosus muscle. Electronically Signed   By: Van Clines M.D.   On: 12/10/2021 16:36   CT HEAD WO CONTRAST (5MM)  Result Date: 12/10/2021 CLINICAL DATA:  Fall yesterday. EXAM: CT HEAD WITHOUT CONTRAST CT CERVICAL SPINE WITHOUT CONTRAST TECHNIQUE: Multidetector CT imaging of the head and  cervical spine was performed following the standard protocol without intravenous  contrast. Multiplanar CT image reconstructions of the cervical spine were also generated. RADIATION DOSE REDUCTION: This exam was performed according to the departmental dose-optimization program which includes automated exposure control, adjustment of the mA and/or kV according to patient size and/or use of iterative reconstruction technique. COMPARISON:  CT head 06/21/2020 FINDINGS: CT HEAD FINDINGS Brain: There is no acute intracranial hemorrhage, extra-axial fluid collection, or acute territorial infarct. There is mild background parenchymal volume loss the ventricles are normal in size. There are numerous areas of hypodensity in the bilateral frontal and occipital lobe cortices consistent with prior infarcts, remote in appearance but some of which are new since 06/21/2020. Additional patchy hypodensity in the supratentorial white matter likely reflects sequela of chronic small-vessel ischemic change. There is no mass lesion.  There is no mass effect or midline shift. Vascular: No hyperdense vessel or unexpected calcification. Skull: Normal. Negative for fracture or focal lesion. Sinuses/Orbits: There is layering fluid in the right maxillary sinus. Bilateral lens implants are in place. The globes and orbits are otherwise unremarkable. Other: None. CT CERVICAL SPINE FINDINGS Alignment: There is reversal of the normal cervical lordosis centered at C4. There is 4-5 mm anterolisthesis of C4 on C5, likely degenerative in nature. There is no jumped or perched facet or other evidence of traumatic malalignment. Skull base and vertebrae: Skull base alignment is maintained. Vertebral body heights are preserved. There is no evidence of acute fracture. There is no suspicious osseous lesion. Soft tissues and spinal canal: No prevertebral fluid or swelling. No visible canal hematoma. Disc levels: There is disc space narrowing and degenerative endplate change most advanced at C4-C5 through C6-C7. Facet arthropathy is most  advanced on the right at C2-C3 and bilaterally at C3-C4. There is no evidence of high-grade spinal canal stenosis. Upper chest: There is emphysema in the lung apices. Other: None. IMPRESSION: 1. No acute intracranial hemorrhage or calvarial fracture. 2. 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 If there is clinical concern for acute ischemia, MRI is recommended. 3. No acute fracture or traumatic malalignment of the cervical spine. 4. Multilevel degenerative changes as above with 4-5 mm degenerative anterolisthesis of C4 on C5. Electronically Signed   By: Valetta Mole M.D.   On: 12/10/2021 16:07   CT Cervical Spine Wo Contrast  Result Date: 12/10/2021 CLINICAL DATA:  Fall yesterday. EXAM: CT HEAD WITHOUT CONTRAST CT CERVICAL SPINE WITHOUT CONTRAST TECHNIQUE: Multidetector CT imaging of the head and cervical spine was performed following the standard protocol without intravenous contrast. Multiplanar CT image reconstructions of the cervical spine were also generated. RADIATION DOSE REDUCTION: This exam was performed according to the departmental dose-optimization program which includes automated exposure control, adjustment of the mA and/or kV according to patient size and/or use of iterative reconstruction technique. COMPARISON:  CT head 06/21/2020 FINDINGS: CT HEAD FINDINGS Brain: There is no acute intracranial hemorrhage, extra-axial fluid collection, or acute territorial infarct. There is mild background parenchymal volume loss the ventricles are normal in size. There are numerous areas of hypodensity in the bilateral frontal and occipital lobe cortices consistent with prior infarcts, remote in appearance but some of which are new since 06/21/2020. Additional patchy hypodensity in the supratentorial white matter likely reflects sequela of chronic small-vessel ischemic change. There is no mass lesion.  There is no mass effect or  midline shift. Vascular: No hyperdense vessel or unexpected calcification. Skull:  Normal. Negative for fracture or focal lesion. Sinuses/Orbits: There is layering fluid in the right maxillary sinus. Bilateral lens implants are in place. The globes and orbits are otherwise unremarkable. Other: None. CT CERVICAL SPINE FINDINGS Alignment: There is reversal of the normal cervical lordosis centered at C4. There is 4-5 mm anterolisthesis of C4 on C5, likely degenerative in nature. There is no jumped or perched facet or other evidence of traumatic malalignment. Skull base and vertebrae: Skull base alignment is maintained. Vertebral body heights are preserved. There is no evidence of acute fracture. There is no suspicious osseous lesion. Soft tissues and spinal canal: No prevertebral fluid or swelling. No visible canal hematoma. Disc levels: There is disc space narrowing and degenerative endplate change most advanced at C4-C5 through C6-C7. Facet arthropathy is most advanced on the right at C2-C3 and bilaterally at C3-C4. There is no evidence of high-grade spinal canal stenosis. Upper chest: There is emphysema in the lung apices. Other: None. IMPRESSION: 1. No acute intracranial hemorrhage or calvarial fracture. 2. 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 If there is clinical concern for acute ischemia, MRI is recommended. 3. No acute fracture or traumatic malalignment of the cervical spine. 4. Multilevel degenerative changes as above with 4-5 mm degenerative anterolisthesis of C4 on C5. Electronically Signed   By: Valetta Mole M.D.   On: 12/10/2021 16:07   DG Tibia/Fibula Right  Result Date: 12/10/2021 CLINICAL DATA:  Pain after fall. EXAM: CHEST  1 VIEW; RIGHT TIBIA AND FIBULA - 2 VIEW; PELVIS - 1-2 VIEW COMPARISON:  Right femur 12/10/2021 FINDINGS: Intramedullary nail extends into the distal right femur. Severe joint space loss along the  medial knee joint. Right knee appears to be located. Negative for fracture involving the right tibia or fibula. Degenerative changes along the medial malleolus. Calcaneal spurring particularly at the Achilles tendon insertion site. IMPRESSION: 1. No acute bone abnormality to the right lower extremity. 2. Severe degenerative changes in the right knee. 3. Calcaneal spurring. Electronically Signed   By: Markus Daft M.D.   On: 12/10/2021 14:45   DG Pelvis 1-2 Views  Result Date: 12/10/2021 CLINICAL DATA:  Pain after fall. EXAM: CHEST  1 VIEW; RIGHT TIBIA AND FIBULA - 2 VIEW; PELVIS - 1-2 VIEW COMPARISON:  Right femur 12/10/2021 FINDINGS: Intramedullary nail extends into the distal right femur. Severe joint space loss along the medial knee joint. Right knee appears to be located. Negative for fracture involving the right tibia or fibula. Degenerative changes along the medial malleolus. Calcaneal spurring particularly at the Achilles tendon insertion site. IMPRESSION: 1. No acute bone abnormality to the right lower extremity. 2. Severe degenerative changes in the right knee. 3. Calcaneal spurring. Electronically Signed   By: Markus Daft M.D.   On: 12/10/2021 14:45   DG Chest 1 View  Result Date: 12/10/2021 CLINICAL DATA:  Pain after fall. EXAM: CHEST  1 VIEW; RIGHT TIBIA AND FIBULA - 2 VIEW; PELVIS - 1-2 VIEW COMPARISON:  Right femur 12/10/2021 FINDINGS: Intramedullary nail extends into the distal right femur. Severe joint space loss along the medial knee joint. Right knee appears to be located. Negative for fracture involving the right tibia or fibula. Degenerative changes along the medial malleolus. Calcaneal spurring particularly at the Achilles tendon insertion site. IMPRESSION: 1. No acute bone abnormality to the right lower extremity. 2. Severe degenerative changes in the right knee. 3. Calcaneal spurring. Electronically Signed   By: [name]  Anselm Pancoast M.D.   On: 12/10/2021 14:45   DG Femur Min 2 Views  Right  Result Date: 12/10/2021 CLINICAL DATA:  Hip pain after fall EXAM: RIGHT FEMUR 2 VIEWS COMPARISON:  10/23/2021 FINDINGS: Evidence of internal fixation across previous femoral neck fracture. No visible acute fracture. No hardware complicating feature. No subluxation or dislocation. IMPRESSION: Remote posttraumatic and post surgical changes across the right femoral neck fracture. No acute bony abnormality. Electronically Signed   By: Rolm Baptise M.D.   On: 12/10/2021 14:43     Time coordinating discharge: Over 30 minutes    Dwyane Dee, MD  [facility] 12/13/2021, 4:34 PM

## 2021-12-13 NOTE — Progress Notes (Signed)
pt refused morning medications after 4 attempts. MD made aware. Documentation in [facility]. Will continue to monitor

## 2022-01-01 ENCOUNTER — Ambulatory Visit: Payer: Medicare HMO | Attending: Internal Medicine | Admitting: Internal Medicine

## 2022-01-01 DIAGNOSIS — I5022 Chronic systolic (congestive) heart failure: Secondary | ICD-10-CM

## 2022-01-01 DIAGNOSIS — Z95810 Presence of automatic (implantable) cardiac defibrillator: Secondary | ICD-10-CM

## 2022-01-01 DIAGNOSIS — I428 Other cardiomyopathies: Secondary | ICD-10-CM

## 2022-01-02 ENCOUNTER — Encounter: Payer: Self-pay | Admitting: Cardiovascular Disease

## 2022-01-02 ENCOUNTER — Encounter: Payer: Self-pay | Admitting: Internal Medicine

## 2022-01-03 ENCOUNTER — Ambulatory Visit (INDEPENDENT_AMBULATORY_CARE_PROVIDER_SITE_OTHER): Payer: Medicare HMO

## 2022-01-03 DIAGNOSIS — I428 Other cardiomyopathies: Secondary | ICD-10-CM | POA: Diagnosis not present

## 2022-01-03 LAB — CUP PACEART REMOTE DEVICE CHECK
Battery Remaining Longevity: 22 mo
Battery Remaining Percentage: 28 %
Battery Voltage: 2.87 V
Brady Statistic AP VP Percent: 1 %
Brady Statistic AP VS Percent: 1 %
Brady Statistic AS VP Percent: 98 %
Brady Statistic AS VS Percent: 2 %
Brady Statistic RA Percent Paced: 1 %
Date Time Interrogation Session: 20231130020018
HighPow Impedance: 49 Ohm
HighPow Impedance: 49 Ohm
Implantable Lead Connection Status: 753985
Implantable Lead Connection Status: 753985
Implantable Lead Connection Status: 753985
Implantable Lead Implant Date: 20110309
Implantable Lead Implant Date: 20110309
Implantable Lead Implant Date: 20110309
Implantable Lead Location: 753858
Implantable Lead Location: 753859
Implantable Lead Location: 753860
Implantable Lead Model: 7121
Implantable Pulse Generator Implant Date: 20180814
Lead Channel Impedance Value: 330 Ohm
Lead Channel Impedance Value: 510 Ohm
Lead Channel Impedance Value: 510 Ohm
Lead Channel Pacing Threshold Amplitude: 0.75 V
Lead Channel Pacing Threshold Amplitude: 0.75 V
Lead Channel Pacing Threshold Amplitude: 0.875 V
Lead Channel Pacing Threshold Pulse Width: 0.5 ms
Lead Channel Pacing Threshold Pulse Width: 0.5 ms
Lead Channel Pacing Threshold Pulse Width: 0.5 ms
Lead Channel Sensing Intrinsic Amplitude: 11.4 mV
Lead Channel Sensing Intrinsic Amplitude: 4.4 mV
Lead Channel Setting Pacing Amplitude: 2 V
Lead Channel Setting Pacing Amplitude: 2.25 V
Lead Channel Setting Pacing Amplitude: 2.5 V
Lead Channel Setting Pacing Pulse Width: 0.5 ms
Lead Channel Setting Pacing Pulse Width: 0.5 ms
Lead Channel Setting Sensing Sensitivity: 0.5 mV
Pulse Gen Serial Number: 9761609

## 2022-01-07 ENCOUNTER — Ambulatory Visit (INDEPENDENT_AMBULATORY_CARE_PROVIDER_SITE_OTHER): Payer: Medicare HMO

## 2022-01-07 DIAGNOSIS — Z95810 Presence of automatic (implantable) cardiac defibrillator: Secondary | ICD-10-CM

## 2022-01-07 DIAGNOSIS — I5022 Chronic systolic (congestive) heart failure: Secondary | ICD-10-CM | POA: Diagnosis not present

## 2022-01-08 ENCOUNTER — Emergency Department (HOSPITAL_COMMUNITY): Payer: Medicare HMO

## 2022-01-08 ENCOUNTER — Encounter (HOSPITAL_COMMUNITY): Payer: Self-pay | Admitting: Pulmonary Disease

## 2022-01-08 ENCOUNTER — Inpatient Hospital Stay (HOSPITAL_COMMUNITY)
Admission: EM | Admit: 2022-01-08 | Discharge: 2022-01-17 | DRG: 100 | Disposition: A | Discharge status: Home Health | Payer: Medicare HMO | Attending: Internal Medicine | Admitting: Internal Medicine

## 2022-01-08 ENCOUNTER — Inpatient Hospital Stay (HOSPITAL_COMMUNITY): Payer: Medicare HMO

## 2022-01-08 DIAGNOSIS — R000 Tachycardia, unspecified: Secondary | ICD-10-CM | POA: Diagnosis not present

## 2022-01-08 DIAGNOSIS — Z8679 Personal history of other diseases of the circulatory system: Secondary | ICD-10-CM

## 2022-01-08 DIAGNOSIS — I639 Cerebral infarction, unspecified: Secondary | ICD-10-CM | POA: Diagnosis not present

## 2022-01-08 DIAGNOSIS — G9389 Other specified disorders of brain: Secondary | ICD-10-CM | POA: Diagnosis present

## 2022-01-08 DIAGNOSIS — R29818 Other symptoms and signs involving the nervous system: Secondary | ICD-10-CM | POA: Diagnosis not present

## 2022-01-08 DIAGNOSIS — G9340 Encephalopathy, unspecified: Secondary | ICD-10-CM | POA: Diagnosis not present

## 2022-01-08 DIAGNOSIS — I5023 Acute on chronic systolic (congestive) heart failure: Secondary | ICD-10-CM | POA: Diagnosis not present

## 2022-01-08 DIAGNOSIS — Z885 Allergy status to narcotic agent status: Secondary | ICD-10-CM

## 2022-01-08 DIAGNOSIS — M7989 Other specified soft tissue disorders: Secondary | ICD-10-CM | POA: Diagnosis not present

## 2022-01-08 DIAGNOSIS — F419 Anxiety disorder, unspecified: Secondary | ICD-10-CM | POA: Diagnosis present

## 2022-01-08 DIAGNOSIS — Z800 Family history of malignant neoplasm of digestive organs: Secondary | ICD-10-CM

## 2022-01-08 DIAGNOSIS — I63233 Cerebral infarction due to unspecified occlusion or stenosis of bilateral carotid arteries: Secondary | ICD-10-CM | POA: Diagnosis not present

## 2022-01-08 DIAGNOSIS — R011 Cardiac murmur, unspecified: Secondary | ICD-10-CM | POA: Diagnosis present

## 2022-01-08 DIAGNOSIS — J449 Chronic obstructive pulmonary disease, unspecified: Secondary | ICD-10-CM | POA: Diagnosis present

## 2022-01-08 DIAGNOSIS — Z9842 Cataract extraction status, left eye: Secondary | ICD-10-CM

## 2022-01-08 DIAGNOSIS — Z803 Family history of malignant neoplasm of breast: Secondary | ICD-10-CM

## 2022-01-08 DIAGNOSIS — M1990 Unspecified osteoarthritis, unspecified site: Secondary | ICD-10-CM | POA: Diagnosis present

## 2022-01-08 DIAGNOSIS — I420 Dilated cardiomyopathy: Secondary | ICD-10-CM | POA: Diagnosis present

## 2022-01-08 DIAGNOSIS — E669 Obesity, unspecified: Secondary | ICD-10-CM | POA: Diagnosis present

## 2022-01-08 DIAGNOSIS — J9621 Acute and chronic respiratory failure with hypoxia: Secondary | ICD-10-CM | POA: Diagnosis not present

## 2022-01-08 DIAGNOSIS — R0902 Hypoxemia: Secondary | ICD-10-CM | POA: Diagnosis not present

## 2022-01-08 DIAGNOSIS — Z8249 Family history of ischemic heart disease and other diseases of the circulatory system: Secondary | ICD-10-CM

## 2022-01-08 DIAGNOSIS — J811 Chronic pulmonary edema: Secondary | ICD-10-CM | POA: Diagnosis not present

## 2022-01-08 DIAGNOSIS — E039 Hypothyroidism, unspecified: Secondary | ICD-10-CM | POA: Diagnosis present

## 2022-01-08 DIAGNOSIS — I110 Hypertensive heart disease with heart failure: Secondary | ICD-10-CM | POA: Diagnosis present

## 2022-01-08 DIAGNOSIS — Z9981 Dependence on supplemental oxygen: Secondary | ICD-10-CM

## 2022-01-08 DIAGNOSIS — B370 Candidal stomatitis: Secondary | ICD-10-CM | POA: Diagnosis not present

## 2022-01-08 DIAGNOSIS — Z8616 Personal history of COVID-19: Secondary | ICD-10-CM | POA: Diagnosis not present

## 2022-01-08 DIAGNOSIS — Z6830 Body mass index (BMI) 30.0-30.9, adult: Secondary | ICD-10-CM

## 2022-01-08 DIAGNOSIS — I82612 Acute embolism and thrombosis of superficial veins of left upper extremity: Secondary | ICD-10-CM | POA: Diagnosis not present

## 2022-01-08 DIAGNOSIS — J9622 Acute and chronic respiratory failure with hypercapnia: Secondary | ICD-10-CM | POA: Diagnosis not present

## 2022-01-08 DIAGNOSIS — G9341 Metabolic encephalopathy: Secondary | ICD-10-CM | POA: Diagnosis not present

## 2022-01-08 DIAGNOSIS — T501X5A Adverse effect of loop [high-ceiling] diuretics, initial encounter: Secondary | ICD-10-CM | POA: Diagnosis not present

## 2022-01-08 DIAGNOSIS — W19XXXA Unspecified fall, initial encounter: Secondary | ICD-10-CM | POA: Diagnosis present

## 2022-01-08 DIAGNOSIS — Z87891 Personal history of nicotine dependence: Secondary | ICD-10-CM

## 2022-01-08 DIAGNOSIS — Z823 Family history of stroke: Secondary | ICD-10-CM

## 2022-01-08 DIAGNOSIS — Z79890 Hormone replacement therapy (postmenopausal): Secondary | ICD-10-CM

## 2022-01-08 DIAGNOSIS — Z7401 Bed confinement status: Secondary | ICD-10-CM

## 2022-01-08 DIAGNOSIS — R569 Unspecified convulsions: Secondary | ICD-10-CM | POA: Diagnosis not present

## 2022-01-08 DIAGNOSIS — Z7902 Long term (current) use of antithrombotics/antiplatelets: Secondary | ICD-10-CM

## 2022-01-08 DIAGNOSIS — Z515 Encounter for palliative care: Secondary | ICD-10-CM

## 2022-01-08 DIAGNOSIS — Z91041 Radiographic dye allergy status: Secondary | ICD-10-CM

## 2022-01-08 DIAGNOSIS — M1711 Unilateral primary osteoarthritis, right knee: Secondary | ICD-10-CM | POA: Diagnosis present

## 2022-01-08 DIAGNOSIS — F05 Delirium due to known physiological condition: Secondary | ICD-10-CM | POA: Diagnosis not present

## 2022-01-08 DIAGNOSIS — Z961 Presence of intraocular lens: Secondary | ICD-10-CM | POA: Diagnosis present

## 2022-01-08 DIAGNOSIS — M8580 Other specified disorders of bone density and structure, unspecified site: Secondary | ICD-10-CM | POA: Diagnosis present

## 2022-01-08 DIAGNOSIS — F03C0 Unspecified dementia, severe, without behavioral disturbance, psychotic disturbance, mood disturbance, and anxiety: Secondary | ICD-10-CM | POA: Diagnosis present

## 2022-01-08 DIAGNOSIS — I63213 Cerebral infarction due to unspecified occlusion or stenosis of bilateral vertebral arteries: Secondary | ICD-10-CM | POA: Diagnosis not present

## 2022-01-08 DIAGNOSIS — I2510 Atherosclerotic heart disease of native coronary artery without angina pectoris: Secondary | ICD-10-CM | POA: Diagnosis present

## 2022-01-08 DIAGNOSIS — Z8673 Personal history of transient ischemic attack (TIA), and cerebral infarction without residual deficits: Secondary | ICD-10-CM

## 2022-01-08 DIAGNOSIS — G40901 Epilepsy, unspecified, not intractable, with status epilepticus: Principal | ICD-10-CM | POA: Diagnosis present

## 2022-01-08 DIAGNOSIS — I69992 Facial weakness following unspecified cerebrovascular disease: Secondary | ICD-10-CM | POA: Diagnosis not present

## 2022-01-08 DIAGNOSIS — E43 Unspecified severe protein-calorie malnutrition: Secondary | ICD-10-CM | POA: Insufficient documentation

## 2022-01-08 DIAGNOSIS — Z8719 Personal history of other diseases of the digestive system: Secondary | ICD-10-CM

## 2022-01-08 DIAGNOSIS — J9811 Atelectasis: Secondary | ICD-10-CM | POA: Diagnosis not present

## 2022-01-08 DIAGNOSIS — F32A Depression, unspecified: Secondary | ICD-10-CM | POA: Diagnosis present

## 2022-01-08 DIAGNOSIS — I951 Orthostatic hypotension: Secondary | ICD-10-CM | POA: Diagnosis present

## 2022-01-08 DIAGNOSIS — Z7984 Long term (current) use of oral hypoglycemic drugs: Secondary | ICD-10-CM

## 2022-01-08 DIAGNOSIS — Z79899 Other long term (current) drug therapy: Secondary | ICD-10-CM

## 2022-01-08 DIAGNOSIS — R059 Cough, unspecified: Secondary | ICD-10-CM | POA: Diagnosis not present

## 2022-01-08 DIAGNOSIS — Z7189 Other specified counseling: Secondary | ICD-10-CM | POA: Diagnosis not present

## 2022-01-08 DIAGNOSIS — I509 Heart failure, unspecified: Secondary | ICD-10-CM | POA: Diagnosis not present

## 2022-01-08 DIAGNOSIS — K219 Gastro-esophageal reflux disease without esophagitis: Secondary | ICD-10-CM | POA: Diagnosis present

## 2022-01-08 DIAGNOSIS — E46 Unspecified protein-calorie malnutrition: Secondary | ICD-10-CM | POA: Insufficient documentation

## 2022-01-08 DIAGNOSIS — H9192 Unspecified hearing loss, left ear: Secondary | ICD-10-CM | POA: Diagnosis present

## 2022-01-08 DIAGNOSIS — D638 Anemia in other chronic diseases classified elsewhere: Secondary | ICD-10-CM | POA: Diagnosis present

## 2022-01-08 DIAGNOSIS — Z9841 Cataract extraction status, right eye: Secondary | ICD-10-CM

## 2022-01-08 DIAGNOSIS — Z90710 Acquired absence of both cervix and uterus: Secondary | ICD-10-CM

## 2022-01-08 DIAGNOSIS — E1165 Type 2 diabetes mellitus with hyperglycemia: Secondary | ICD-10-CM | POA: Diagnosis present

## 2022-01-08 DIAGNOSIS — Z86010 Personal history of colonic polyps: Secondary | ICD-10-CM

## 2022-01-08 DIAGNOSIS — E440 Moderate protein-calorie malnutrition: Secondary | ICD-10-CM | POA: Diagnosis not present

## 2022-01-08 DIAGNOSIS — Z7951 Long term (current) use of inhaled steroids: Secondary | ICD-10-CM

## 2022-01-08 DIAGNOSIS — S7291XA Unspecified fracture of right femur, initial encounter for closed fracture: Secondary | ICD-10-CM | POA: Diagnosis present

## 2022-01-08 DIAGNOSIS — Z95810 Presence of automatic (implantable) cardiac defibrillator: Secondary | ICD-10-CM

## 2022-01-08 DIAGNOSIS — E876 Hypokalemia: Secondary | ICD-10-CM | POA: Diagnosis present

## 2022-01-08 DIAGNOSIS — G4733 Obstructive sleep apnea (adult) (pediatric): Secondary | ICD-10-CM | POA: Diagnosis present

## 2022-01-08 DIAGNOSIS — R0602 Shortness of breath: Secondary | ICD-10-CM | POA: Diagnosis not present

## 2022-01-08 DIAGNOSIS — Z9049 Acquired absence of other specified parts of digestive tract: Secondary | ICD-10-CM

## 2022-01-08 LAB — I-STAT ARTERIAL BLOOD GAS, ED
Acid-base deficit: 2 mmol/L (ref 0.0–2.0)
Bicarbonate: 26.2 mmol/L (ref 20.0–28.0)
Calcium, Ion: 0.95 mmol/L — ABNORMAL LOW (ref 1.15–1.40)
HCT: 34 % — ABNORMAL LOW (ref 36.0–46.0)
Hemoglobin: 11.6 g/dL — ABNORMAL LOW (ref 12.0–15.0)
O2 Saturation: 100 %
Potassium: 2.8 mmol/L — ABNORMAL LOW (ref 3.5–5.1)
Sodium: 143 mmol/L (ref 135–145)
TCO2: 28 mmol/L (ref 22–32)
pCO2 arterial: 60.3 mmHg — ABNORMAL HIGH (ref 32–48)
pH, Arterial: 7.246 — ABNORMAL LOW (ref 7.35–7.45)
pO2, Arterial: 485 mmHg — ABNORMAL HIGH (ref 83–108)

## 2022-01-08 LAB — I-STAT CHEM 8, ED
BUN: 8 mg/dL (ref 8–23)
Calcium, Ion: 0.84 mmol/L — CL (ref 1.15–1.40)
Chloride: 105 mmol/L (ref 98–111)
Creatinine, Ser: 1 mg/dL (ref 0.44–1.00)
Glucose, Bld: 202 mg/dL — ABNORMAL HIGH (ref 70–99)
HCT: 35 % — ABNORMAL LOW (ref 36.0–46.0)
Hemoglobin: 11.9 g/dL — ABNORMAL LOW (ref 12.0–15.0)
Potassium: 3.3 mmol/L — ABNORMAL LOW (ref 3.5–5.1)
Sodium: 145 mmol/L (ref 135–145)
TCO2: 22 mmol/L (ref 22–32)

## 2022-01-08 LAB — LACTIC ACID, PLASMA: Lactic Acid, Venous: >9 mmol/L (ref 0.5–1.9)

## 2022-01-08 LAB — BASIC METABOLIC PANEL
Anion gap: 11 (ref 5–15)
BUN: 8 mg/dL (ref 8–23)
CO2: 20 mmol/L — ABNORMAL LOW (ref 22–32)
Calcium: 7 mg/dL — ABNORMAL LOW (ref 8.9–10.3)
Chloride: 110 mmol/L (ref 98–111)
Creatinine, Ser: 0.85 mg/dL (ref 0.44–1.00)
GFR, Estimated: >60 mL/min (ref >60)
Glucose, Bld: 133 mg/dL — ABNORMAL HIGH (ref 70–99)
Potassium: 4.2 mmol/L (ref 3.5–5.1)
Sodium: 141 mmol/L (ref 135–145)

## 2022-01-08 LAB — CBC
HCT: 34.4 % — ABNORMAL LOW (ref 36.0–46.0)
Hemoglobin: 10.4 g/dL — ABNORMAL LOW (ref 12.0–15.0)
MCH: 26.9 pg (ref 26.0–34.0)
MCHC: 30.2 g/dL (ref 30.0–36.0)
MCV: 88.9 fL (ref 80.0–100.0)
Platelets: 267 10*3/uL (ref 150–400)
RBC: 3.87 MIL/uL (ref 3.87–5.11)
RDW: 22 % — ABNORMAL HIGH (ref 11.5–15.5)
WBC: 8.1 10*3/uL (ref 4.0–10.5)
nRBC: 0 % (ref 0.0–0.2)

## 2022-01-08 LAB — COMPREHENSIVE METABOLIC PANEL
ALT: 8 U/L (ref 0–44)
AST: 21 U/L (ref 15–41)
Albumin: 2.4 g/dL — ABNORMAL LOW (ref 3.5–5.0)
Alkaline Phosphatase: 211 U/L — ABNORMAL HIGH (ref 38–126)
Anion gap: 20 — ABNORMAL HIGH (ref 5–15)
BUN: 8 mg/dL (ref 8–23)
CO2: 18 mmol/L — ABNORMAL LOW (ref 22–32)
Calcium: 7 mg/dL — ABNORMAL LOW (ref 8.9–10.3)
Chloride: 105 mmol/L (ref 98–111)
Creatinine, Ser: 1.11 mg/dL — ABNORMAL HIGH (ref 0.44–1.00)
GFR, Estimated: 53 mL/min — ABNORMAL LOW (ref >60)
Glucose, Bld: 206 mg/dL — ABNORMAL HIGH (ref 70–99)
Potassium: 3.2 mmol/L — ABNORMAL LOW (ref 3.5–5.1)
Sodium: 143 mmol/L (ref 135–145)
Total Bilirubin: 0.3 mg/dL (ref 0.3–1.2)
Total Protein: 6.5 g/dL (ref 6.5–8.1)

## 2022-01-08 LAB — TROPONIN I (HIGH SENSITIVITY): Troponin I (High Sensitivity): 10 ng/L (ref <18)

## 2022-01-08 LAB — CBG MONITORING, ED: Glucose-Capillary: 183 mg/dL — ABNORMAL HIGH (ref 70–99)

## 2022-01-08 LAB — URINALYSIS, ROUTINE W REFLEX MICROSCOPIC
Bilirubin Urine: NEGATIVE
Glucose, UA: NEGATIVE mg/dL
Hgb urine dipstick: NEGATIVE
Ketones, ur: NEGATIVE mg/dL
Leukocytes,Ua: NEGATIVE
Nitrite: NEGATIVE
Protein, ur: NEGATIVE mg/dL
Specific Gravity, Urine: 1.011 (ref 1.005–1.030)
pH: 6 (ref 5.0–8.0)

## 2022-01-08 LAB — DIFFERENTIAL
Abs Immature Granulocytes: 0 10*3/uL (ref 0.00–0.07)
Basophils Absolute: 0 10*3/uL (ref 0.0–0.1)
Basophils Relative: 0 %
Eosinophils Absolute: 0 10*3/uL (ref 0.0–0.5)
Eosinophils Relative: 0 %
Lymphocytes Relative: 14 %
Lymphs Abs: 1.1 10*3/uL (ref 0.7–4.0)
Monocytes Absolute: 0.2 10*3/uL (ref 0.1–1.0)
Monocytes Relative: 3 %
Neutro Abs: 6.7 10*3/uL (ref 1.7–7.7)
Neutrophils Relative %: 83 %
nRBC: 0 /100 WBC

## 2022-01-08 LAB — GLUCOSE, CAPILLARY
Glucose-Capillary: 130 mg/dL — ABNORMAL HIGH (ref 70–99)
Glucose-Capillary: 143 mg/dL — ABNORMAL HIGH (ref 70–99)
Glucose-Capillary: 115 mg/dL — ABNORMAL HIGH (ref 70–99)
Glucose-Capillary: 128 mg/dL — ABNORMAL HIGH (ref 70–99)

## 2022-01-08 LAB — DIGOXIN LEVEL: Digoxin Level: 0.6 ng/mL — ABNORMAL LOW (ref 0.8–2.0)

## 2022-01-08 LAB — MRSA NEXT GEN BY PCR, NASAL: MRSA by PCR Next Gen: NOT DETECTED

## 2022-01-08 LAB — PROTIME-INR
INR: 1 (ref 0.8–1.2)
Prothrombin Time: 13.1 seconds (ref 11.4–15.2)

## 2022-01-08 LAB — APTT: aPTT: 27 seconds (ref 24–36)

## 2022-01-08 LAB — MAGNESIUM: Magnesium: 1 mg/dL — ABNORMAL LOW (ref 1.7–2.4)

## 2022-01-08 LAB — ETHANOL: Alcohol, Ethyl (B): <10 mg/dL (ref <10)

## 2022-01-08 MED ORDER — HEPARIN SODIUM (PORCINE) 5000 UNIT/ML IJ SOLN
5000.0000 [IU] | Freq: Three times a day (TID) | INTRAMUSCULAR | Status: DC
  Administered 2022-01-08 – 2022-01-09 (×3): 5000 [IU] via SUBCUTANEOUS
  Filled 2022-01-08 (×3): qty 1

## 2022-01-08 MED ORDER — IOHEXOL 350 MG/ML SOLN
100.0000 mL | Freq: Once | INTRAVENOUS | Status: AC | PRN
  Administered 2022-01-08: 100 mL via INTRAVENOUS

## 2022-01-08 MED ORDER — ATORVASTATIN CALCIUM 40 MG PO TABS
80.0000 mg | ORAL_TABLET | Freq: Every day | ORAL | Status: DC
  Administered 2022-01-08 – 2022-01-09 (×2): 80 mg
  Filled 2022-01-08 (×3): qty 2

## 2022-01-08 MED ORDER — ORAL CARE MOUTH RINSE
15.0000 mL | OROMUCOSAL | Status: DC
  Administered 2022-01-08 – 2022-01-09 (×14): 15 mL via OROMUCOSAL

## 2022-01-08 MED ORDER — BUDESONIDE 0.5 MG/2ML IN SUSP
0.5000 mg | Freq: Two times a day (BID) | RESPIRATORY_TRACT | Status: DC
  Administered 2022-01-08 – 2022-01-17 (×17): 0.5 mg via RESPIRATORY_TRACT
  Filled 2022-01-08 (×18): qty 2

## 2022-01-08 MED ORDER — LORAZEPAM 2 MG/ML IJ SOLN: 2.0000 mg | Freq: Once | INTRAMUSCULAR | Status: AC

## 2022-01-08 MED ORDER — IPRATROPIUM BROMIDE 0.02 % IN SOLN
0.5000 mg | Freq: Four times a day (QID) | RESPIRATORY_TRACT | Status: DC
  Administered 2022-01-08 – 2022-01-09 (×5): 0.5 mg via RESPIRATORY_TRACT
  Filled 2022-01-08 (×5): qty 2.5

## 2022-01-08 MED ORDER — DOCUSATE SODIUM 50 MG/5ML PO LIQD: 100.0000 mg | Freq: Two times a day (BID) | ORAL | Status: DC | PRN

## 2022-01-08 MED ORDER — LEVOTHYROXINE SODIUM 112 MCG PO TABS
112.0000 ug | ORAL_TABLET | Freq: Every day | ORAL | Status: DC
  Administered 2022-01-09: 112 ug
  Filled 2022-01-08: qty 1

## 2022-01-08 MED ORDER — ETOMIDATE 2 MG/ML IV SOLN
INTRAVENOUS | Status: DC | PRN
  Administered 2022-01-08: 20 mg via INTRAVENOUS

## 2022-01-08 MED ORDER — MIDAZOLAM HCL 2 MG/2ML IJ SOLN
INTRAMUSCULAR | Status: AC
  Administered 2022-01-08: 2 mg
  Filled 2022-01-08: qty 2

## 2022-01-08 MED ORDER — FENTANYL BOLUS VIA INFUSION
25.0000 ug | INTRAVENOUS | Status: DC | PRN
  Administered 2022-01-08 – 2022-01-09 (×3): 100 ug via INTRAVENOUS

## 2022-01-08 MED ORDER — INSULIN ASPART 100 UNIT/ML IJ SOLN
0.0000 [IU] | INTRAMUSCULAR | Status: DC
  Administered 2022-01-08 – 2022-01-09 (×3): 2 [IU] via SUBCUTANEOUS

## 2022-01-08 MED ORDER — MIDAZOLAM HCL 2 MG/2ML IJ SOLN
1.0000 mg | INTRAMUSCULAR | Status: DC | PRN
  Administered 2022-01-08 – 2022-01-09 (×5): 2 mg via INTRAVENOUS
  Filled 2022-01-08 (×4): qty 2

## 2022-01-08 MED ORDER — LACTATED RINGERS IV SOLN: INTRAVENOUS | Status: AC

## 2022-01-08 MED ORDER — FENTANYL CITRATE PF 50 MCG/ML IJ SOSY: 25.0000 ug | PREFILLED_SYRINGE | Freq: Once | INTRAMUSCULAR | Status: DC

## 2022-01-08 MED ORDER — LEVETIRACETAM IN NACL 1000 MG/100ML IV SOLN
1000.0000 mg | Freq: Two times a day (BID) | INTRAVENOUS | Status: DC
  Administered 2022-01-08 – 2022-01-10 (×4): 1000 mg via INTRAVENOUS
  Filled 2022-01-08 (×5): qty 100

## 2022-01-08 MED ORDER — LACTATED RINGERS IV BOLUS
1000.0000 mL | Freq: Once | INTRAVENOUS | Status: AC
  Administered 2022-01-08: 1000 mL via INTRAVENOUS

## 2022-01-08 MED ORDER — ROCURONIUM BROMIDE 50 MG/5ML IV SOLN
INTRAVENOUS | Status: DC | PRN
  Administered 2022-01-08: 90 mg via INTRAVENOUS

## 2022-01-08 MED ORDER — LEVETIRACETAM IN NACL 1500 MG/100ML IV SOLN
1500.0000 mg | Freq: Once | INTRAVENOUS | Status: AC
  Administered 2022-01-08: 1500 mg via INTRAVENOUS
  Filled 2022-01-08: qty 100

## 2022-01-08 MED ORDER — ALBUTEROL SULFATE (2.5 MG/3ML) 0.083% IN NEBU: 2.5000 mg | INHALATION_SOLUTION | RESPIRATORY_TRACT | Status: DC | PRN

## 2022-01-08 MED ORDER — DIPHENHYDRAMINE HCL 50 MG/ML IJ SOLN
50.0000 mg | Freq: Once | INTRAMUSCULAR | Status: AC
  Administered 2022-01-08: 50 mg via INTRAVENOUS

## 2022-01-08 MED ORDER — AMIODARONE LOAD VIA INFUSION
150.0000 mg | Freq: Once | INTRAVENOUS | Status: DC
  Filled 2022-01-08: qty 83.34

## 2022-01-08 MED ORDER — CALCIUM GLUCONATE-NACL 1-0.675 GM/50ML-% IV SOLN
1.0000 g | Freq: Once | INTRAVENOUS | Status: AC
  Administered 2022-01-08: 1000 mg via INTRAVENOUS
  Filled 2022-01-08: qty 50

## 2022-01-08 MED ORDER — MAGNESIUM SULFATE 4 GM/100ML IV SOLN
4.0000 g | Freq: Once | INTRAVENOUS | Status: AC
  Administered 2022-01-08: 4 g via INTRAVENOUS
  Filled 2022-01-08 (×2): qty 100

## 2022-01-08 MED ORDER — FENTANYL 2500MCG IN NS 250ML (10MCG/ML) PREMIX INFUSION
25.0000 ug/h | INTRAVENOUS | Status: DC
  Administered 2022-01-08: 50 ug/h via INTRAVENOUS
  Administered 2022-01-08 – 2022-01-09 (×2): 200 ug/h via INTRAVENOUS
  Filled 2022-01-08 (×3): qty 250

## 2022-01-08 MED ORDER — DIPHENHYDRAMINE HCL 25 MG PO CAPS: 50.0000 mg | ORAL_CAPSULE | Freq: Once | ORAL | Status: AC

## 2022-01-08 MED ORDER — LORAZEPAM 2 MG/ML IJ SOLN
INTRAMUSCULAR | Status: AC
  Administered 2022-01-08: 2 mg via INTRAVENOUS
  Filled 2022-01-08: qty 1

## 2022-01-08 MED ORDER — SODIUM CHLORIDE 0.9% FLUSH: 3.0000 mL | Freq: Once | INTRAVENOUS | Status: DC

## 2022-01-08 MED ORDER — LACTATED RINGERS IV SOLN: INTRAVENOUS | Status: DC

## 2022-01-08 MED ORDER — POTASSIUM CHLORIDE 20 MEQ PO PACK
40.0000 meq | PACK | Freq: Once | ORAL | Status: AC
  Administered 2022-01-08: 40 meq
  Filled 2022-01-08: qty 2

## 2022-01-08 MED ORDER — SODIUM CHLORIDE 0.9 % IV BOLUS
1000.0000 mL | Freq: Once | INTRAVENOUS | Status: AC
  Administered 2022-01-08: 1000 mL via INTRAVENOUS

## 2022-01-08 MED ORDER — ARFORMOTEROL TARTRATE 15 MCG/2ML IN NEBU
15.0000 ug | INHALATION_SOLUTION | Freq: Two times a day (BID) | RESPIRATORY_TRACT | Status: DC
  Administered 2022-01-09 – 2022-01-17 (×16): 15 ug via RESPIRATORY_TRACT
  Filled 2022-01-08 (×19): qty 2

## 2022-01-08 MED ORDER — AMIODARONE HCL IN DEXTROSE 360-4.14 MG/200ML-% IV SOLN
INTRAVENOUS | Status: AC
  Filled 2022-01-08: qty 200

## 2022-01-08 MED ORDER — CLOPIDOGREL BISULFATE 75 MG PO TABS
75.0000 mg | ORAL_TABLET | Freq: Every day | ORAL | Status: DC
  Administered 2022-01-08 – 2022-01-09 (×2): 75 mg
  Filled 2022-01-08 (×2): qty 1

## 2022-01-08 MED ORDER — AMIODARONE HCL IN DEXTROSE 360-4.14 MG/200ML-% IV SOLN: 30.0000 mg/h | INTRAVENOUS | Status: DC

## 2022-01-08 MED ORDER — METHYLPREDNISOLONE SODIUM SUCC 125 MG IJ SOLR
125.0000 mg | Freq: Once | INTRAMUSCULAR | Status: AC
  Administered 2022-01-08: 125 mg via INTRAVENOUS

## 2022-01-08 MED ORDER — POLYETHYLENE GLYCOL 3350 17 G PO PACK: 17.0000 g | PACK | Freq: Every day | ORAL | Status: DC | PRN

## 2022-01-08 MED ORDER — PANTOPRAZOLE SODIUM 40 MG IV SOLR
40.0000 mg | Freq: Every day | INTRAVENOUS | Status: DC
  Administered 2022-01-08: 40 mg via INTRAVENOUS
  Filled 2022-01-08: qty 10

## 2022-01-08 MED ORDER — ORAL CARE MOUTH RINSE: 15.0000 mL | OROMUCOSAL | Status: DC | PRN

## 2022-01-08 MED ORDER — POTASSIUM CHLORIDE 10 MEQ/100ML IV SOLN
10.0000 meq | INTRAVENOUS | Status: AC
  Administered 2022-01-08 (×4): 10 meq via INTRAVENOUS
  Filled 2022-01-08 (×4): qty 100

## 2022-01-08 MED ORDER — AMIODARONE HCL IN DEXTROSE 360-4.14 MG/200ML-% IV SOLN: 60.0000 mg/h | INTRAVENOUS | Status: DC

## 2022-01-08 MED ORDER — AMIODARONE IV BOLUS ONLY 150 MG/100ML
INTRAVENOUS | Status: AC
  Filled 2022-01-08: qty 100

## 2022-01-08 NOTE — Code Documentation (Signed)
Stroke Response Nurse Documentation Code Documentation  Stacy Grimes is a 71 y.o. female arriving to [facility]  via [facility] on 01/08/2022 with past medical hx of hypertension, COPD, AAA, hypothyroidism, anxiety, depression, GERD, stroke, ICD in place, sleep apnea, oxygen dependent, dysrhythmia, heart failure with reduced ejection fraction, and dementia. Recent fall with nondisplaced femur fracture. On clopidogrel 75 mg daily. Code stroke was activated by EMS.   Patient from home where she was LKW at [place]. Husband was woken up by shaking of the bed. He reports abnormal respirations, right sided gaze. EMS witnessed seizure en route.   Stroke team at the bedside on patient arrival. Labs drawn and patient cleared for CT by Dr. Doren Custard. Patient to CT with team. NIHSS 26, see documentation for details and code stroke times. Patient with decreased LOC, disoriented, not following commands, right gaze preference , left hemianopia, bilateral arm weakness, bilateral leg weakness, bilateral decreased sensation, Global aphasia , dysarthria , and Visual  neglect on exam. Treated with benadryl and solumedrol for contrast allergy. O2 sat noted to be low. Taken to trauma bay for intubation [id number]. Delay in CTA due to IV issues. The following imaging was completed:  CT Head and CTA. Patient is not a candidate for IV Thrombolytic due to out of window. Patient is not a candidate for IR due to MRS.   Care Plan: Q2 hour assessments.   Bedside handoff with ED RN Deneise Lever.    Leverne Humbles Stroke Response RN

## 2022-01-08 NOTE — ED Notes (Signed)
Pt became hypoxic in CT scanner to 68%, bag valve ventilation initiated by MD Dixon, taken back to Tra B at that time. Sats improved to 100% w/ BVM. Intubation planning

## 2022-01-08 NOTE — Progress Notes (Signed)
LTM maint complete - no skin breakdown [facility] monitored, Event button test confirmed by [facility]. MR compatible leads in use.

## 2022-01-08 NOTE — Progress Notes (Signed)
Pt transported from trauma B to 4m06 on vent without complications.

## 2022-01-08 NOTE — Progress Notes (Signed)
Patient moved to 2M06. [facility] is now monitoring. Test button was tested. Patient does have on MRI compatible leads.

## 2022-01-08 NOTE — Progress Notes (Signed)
LTM EEG running - no initial skin breakdown -

## 2022-01-08 NOTE — Progress Notes (Signed)
Pt became agitated, PRN fentanyl boluse given. Pt  cardiac monitor alarming vtach, pt had pulse. MD made aware. See new orders. EKG obtained. Cardiology came by and said it was sinus tach with left BBB

## 2022-01-08 NOTE — Consult Note (Signed)
Neurology Consultation  Reason for Consult: seizures, right gaze deviation, AMS Referring Physician: Dr. Doren Custard  CC: none  History is obtained from:husband and chart  HPI: Stacy Grimes is a 71 y.o. female with history of dementia, COPD, CAD, cardiomyopathy, CHF, sleep apnea, hypothyroidism, hypertension, anxiety, recent right femur fracture with repair and osteopenia who presents after having a seizure with right gaze deviation this morning.  Patient was discharged from the hospital last month after a repair of a right femur fracture which happened after a fall.  According to patient's husband, she has had some trouble with her left knee, has been unable to walk and has been bedbound lately.  At baseline, she is reportedly alert and oriented to self only.  Patient was last known well at 0130 this morning.  Earlier in the morning, her husband awoke to hear her bed shaking and found her having a seizure with right gaze deviation.  Patient had an additional seizure while on route to the hospital with EMS and was given a dose of Versed.   LKW: 0130 TNK given?: no, outside of window IR Thrombectomy? No, Rankin scale of 5 and no LVO Modified Rankin Scale: 5-Severe disability-bedridden, incontinent, needs constant attention  ROS: A complete ROS was performed and is negative except as noted in the HPI.   Past Medical History:  Diagnosis Date   AAA (abdominal aortic aneurysm) (HCC)    Anginal pain ([place])    Anxiety    Arthritis    Automatic implantable cardioverter-defibrillator in situ    Biventricular ICD  [place]    COPD (chronic obstructive pulmonary disease) (HCC)    Deafness    left ear   Dementia (HCC)    Depression    Dizziness    Dysrhythmia    Fracture    history of spinal fracture   GERD (gastroesophageal reflux disease)    HFrEF (heart failure with reduced ejection fraction) ([place])    a. 2010 Cath: nonobs dzs, EF 15-20%; b. 09/2017 Echo: EF suboptimal. EF low nl; c. 04/2018  Echo: EF 40-45%.   Hypertension    Hypothyroidism    Non-ischemic cardiomyopathy ([place])    a. 2010 Cath: nonobs dzs, EF 15-20%; b. 2011 s/p SJM CRT-D; c. 09/2016 s/p Gen change; d. 09/2017 Echo: EF suboptimal. EF low nl; e. 04/2018 Echo: EF 40-45%.   Oxygen dependent    Palpitations    Shortness of breath dyspnea    Sleep apnea    Wheezing      Family History  Problem Relation Age of Onset   Hypertension Mother    Breast cancer Mother    Stroke Mother    Heart disease Father    Heart attack Sister    Hypertension Brother    Heart attack Sister    Colon cancer Brother 106     Social History:   reports that she quit smoking about 19 years ago. Her smoking use included cigarettes. She has a 60.00 pack-year smoking history. She has never used smokeless tobacco. She reports that she does not drink alcohol and does not use drugs.  Medications  Current Facility-Administered Medications:    diphenhydrAMINE (BENADRYL) capsule 50 mg, 50 mg, Oral, Once **OR** diphenhydrAMINE (BENADRYL) injection 50 mg, 50 mg, Intravenous, Once, Quinn Axe, Patrecia Pour, MD   etomidate (AMIDATE) injection, , Intravenous, PRN, Godfrey Pick, MD, 20 mg at 01/08/22 0841   fentaNYL (SUBLIMAZE) bolus via infusion 25-100 mcg, 25-100 mcg, Intravenous, Q15 min PRN, Godfrey Pick, MD  fentaNYL (SUBLIMAZE) injection 25 mcg, 25 mcg, Intravenous, Once, Godfrey Pick, MD   fentaNYL 2531mcg in NS 256mL (65mcg/ml) infusion-PREMIX, 25-200 mcg/hr, Intravenous, Continuous, Godfrey Pick, MD, Last Rate: 5 mL/hr at 01/08/22 0850, 50 mcg/hr at 01/08/22 0850   lactated ringers bolus 1,000 mL, 1,000 mL, Intravenous, Once, Godfrey Pick, MD   lactated ringers infusion, , Intravenous, Continuous, Godfrey Pick, MD   midazolam (VERSED) injection 1-2 mg, 1-2 mg, Intravenous, Q1H PRN, Godfrey Pick, MD   rocuronium Alfredia Client) injection, , Intravenous, PRN, Godfrey Pick, MD, 90 mg at 01/08/22 0842   sodium chloride flush (NS) 0.9 % injection 3 mL, 3 mL,  Intravenous, Once, Godfrey Pick, MD  Current Outpatient Medications:    albuterol (ACCUNEB) 0.63 MG/3ML nebulizer solution, Take 1 ampule by nebulization every 6 (six) hours as needed for wheezing., Disp: , Rfl:    albuterol (PROVENTIL HFA;VENTOLIN HFA) 108 (90 Base) MCG/ACT inhaler, Inhale 2 puffs into the lungs every 6 (six) hours as needed., Disp: 1 Inhaler, Rfl: 0   allopurinol (ZYLOPRIM) 100 MG tablet, Take 0.5 tablets (50 mg total) by mouth daily., Disp: 30 tablet, Rfl: 3   atorvastatin (LIPITOR) 80 MG tablet, Take 1 tablet (80 mg total) by mouth daily., Disp: 90 tablet, Rfl: 3   budesonide-formoterol (SYMBICORT) 160-4.5 MCG/ACT inhaler, Inhale 2 puffs into the lungs 2 (two) times daily., Disp: , Rfl:    clopidogrel (PLAVIX) 75 MG tablet, Take 1 tablet (75 mg total) by mouth daily., Disp: 90 tablet, Rfl: 3   diclofenac Sodium (VOLTAREN) 1 % GEL, Apply 2 g topically 4 (four) times daily., Disp: , Rfl:    digoxin (LANOXIN) 0.125 MG tablet, TAKE 1 TABLET BY MOUTH ON MONDAY, WEDNESDAY AND FRIDAY, Disp: 12 tablet, Rfl: 2   donepezil (ARICEPT) 10 MG tablet, Take 10 mg by mouth at bedtime., Disp: , Rfl:    furosemide (LASIX) 20 MG tablet, Take 1 tablet (20 mg total) by mouth every other day. May take an additional tablet AS NEEDED for weight gain of 3 lbs overnight or 5 lbs in one week. Do not exceed more than 3 additional doses. - Oral (Patient not taking: Reported on 12/10/2021), Disp: 45 tablet, Rfl: 3   gabapentin (NEURONTIN) 400 MG capsule, Take 1 capsule (400 mg total) by mouth 3 (three) times daily., Disp: , Rfl:    hydrOXYzine (VISTARIL) 25 MG capsule, Take 25 mg by mouth at bedtime as needed for anxiety., Disp: , Rfl:    levothyroxine (SYNTHROID) 112 MCG tablet, Take 112 mcg by mouth daily before breakfast., Disp: , Rfl:    losartan (COZAAR) 25 MG tablet, Take 0.5 tablets (12.5 mg total) by mouth daily., Disp: 45 tablet, Rfl: 3   memantine (NAMENDA) 10 MG tablet, Take 10 mg by mouth in the  morning and at bedtime., Disp: , Rfl:    Menthol, Topical Analgesic, (BENGAY EX), Apply 1 application topically daily as needed (back pain)., Disp: , Rfl:    metFORMIN (GLUCOPHAGE-XR) 500 MG 24 hr tablet, Take 500 mg by mouth 2 (two) times daily with a meal. , Disp: , Rfl:    methocarbamol (ROBAXIN) 750 MG tablet, Take 1 tablet (750 mg total) by mouth 3 (three) times daily., Disp: , Rfl:    metoprolol succinate (TOPROL-XL) 25 MG 24 hr tablet, Take 25 mg by mouth daily., Disp: , Rfl:    mirtazapine (REMERON) 7.5 MG tablet, Take 7.5 mg by mouth 2 (two) times daily., Disp: , Rfl:    omeprazole (PRILOSEC) 20 MG capsule,  Take 1 capsule (20 mg total) by mouth every morning., Disp: 30 capsule, Rfl: 2   potassium chloride SA (KLOR-CON M) 20 MEQ tablet, Take 1 tablet (20 mEq total) by mouth daily., Disp: 30 tablet, Rfl: 6   Sennosides (EX-LAX PO), Take 2 tablets by mouth at bedtime as needed (constipation)., Disp: , Rfl:    tiotropium (SPIRIVA) 18 MCG inhalation capsule, Place 1 capsule (18 mcg total) into inhaler and inhale daily as needed (shortness of breath)., Disp: 30 capsule, Rfl: 12   venlafaxine XR (EFFEXOR-XR) 75 MG 24 hr capsule, Take 75 mg by mouth 2 (two) times daily., Disp: , Rfl:    vitamin B-12 (CYANOCOBALAMIN) 1000 MCG tablet, Take 1,000 mcg by mouth 2 (two) times daily. , Disp: , Rfl:    Exam: Current vital signs: BP (!) 136/94   Pulse (!) 133   Resp 18   Ht 5\' 3"  (1.6 m)   Wt 77.6 kg   SpO2 100%   BMI 30.30 kg/m  Vital signs in last 24 hours: Pulse Rate:  [125-143] 133 (12/05 0905) Resp:  [15-24] 18 (12/05 0905) BP: (92-147)/(65-94) 136/94 (12/05 0905) SpO2:  [100 %] 100 % (12/05 0905) FiO2 (%):  [100 %] 100 % (12/05 0848) Weight:  [77.6 kg] 77.6 kg (12/05 0800)  GENERAL: Eyes open, restless, does not respond to verbal stimuli Head: Normocephalic and atraumatic, without obvious abnormality EENT: Normal conjunctivae, moist mucous membranes, no OP obstruction LUNGS:  Respirations somewhat labored with O2 sats in the 60s on room air CV: Tachycardic regular rate and rhythm on telemetry Extremities: warm, well perfused, without obvious deformity  NEURO:  Mental Status: Patient's eyes are open, but she does not respond to verbal stimuli, cannot follow commands, moves all extremities nonpurposeful he with antigravity strength.  She is nonverbal but does moan at times Cranial Nerves:  Pupils equal round and reactive, right gaze deviation, does not blink to threat, face appears symmetrical at rest, unable to test facial sensation, not cooperative with tongue protrusion Motor: Moves all extremities not purposefully with antigravity strength  NIHSS: 1a Level of Conscious.: 1 1b LOC Questions: 2 1c LOC Commands:2  2 Best Gaze: 2 3 Visual: 3 4 Facial Palsy:  5a Motor Arm - left: 0 5b Motor Arm - Right: 2 6a Motor Leg - Left: 2 6b Motor Leg - Right: 3 7 Limb Ataxia: 0 8 Sensory: 1 9 Best Language: 2 10 Dysarthria: 2 11 Extinct. and Inatten.: 1 TOTAL: 25   Labs I have reviewed labs in epic and the results pertinent to this consultation are:   CBC    Component Value Date/Time   WBC 8.1 01/08/2022 0822   RBC 3.87 01/08/2022 0822   HGB 11.9 (L) 01/08/2022 0826   HGB 10.9 (L) 09/10/2016 1236   HCT 35.0 (L) 01/08/2022 0826   HCT 36.2 09/10/2016 1236   PLT 267 01/08/2022 0822   PLT 455 (H) 09/10/2016 1236   MCV 88.9 01/08/2022 0822   MCV 84 09/10/2016 1236   MCV 88 10/07/2013 1041   MCH 26.9 01/08/2022 0822   MCHC 30.2 01/08/2022 0822   RDW 22.0 (H) 01/08/2022 0822   RDW 17.0 (H) 09/10/2016 1236   RDW 15.7 (H) 10/07/2013 1041   LYMPHSABS 1.1 01/08/2022 0822   LYMPHSABS 2.5 09/10/2016 1236   LYMPHSABS 1.9 10/07/2013 1041   MONOABS 0.2 01/08/2022 0822   MONOABS 0.8 10/07/2013 1041   EOSABS 0.0 01/08/2022 0822   EOSABS 0.3 09/10/2016 1236   EOSABS 0.2  10/07/2013 1041   BASOSABS 0.0 01/08/2022 0822   BASOSABS 0.0 09/10/2016 1236   BASOSABS  0.1 10/07/2013 1041    CMP     Component Value Date/Time   NA 145 01/08/2022 0826   NA CANCELED 07/18/2020 1605   NA 142 10/07/2013 1041   K 3.3 (L) 01/08/2022 0826   K 3.0 (L) 10/13/2013 0722   CL 105 01/08/2022 0826   CL 97 (L) 10/07/2013 1041   CO2 18 (L) 01/08/2022 0822   CO2 36 (H) 10/07/2013 1041   GLUCOSE 202 (H) 01/08/2022 0826   GLUCOSE 150 (H) 10/07/2013 1041   BUN 8 01/08/2022 0826   BUN CANCELED 07/18/2020 1605   BUN 9 10/07/2013 1041   CREATININE 1.00 01/08/2022 0826   CREATININE 1.04 10/07/2013 1041   CALCIUM 7.0 (L) 01/08/2022 0822   CALCIUM 9.3 10/07/2013 1041   PROT 6.5 01/08/2022 0822   PROT 7.6 10/12/2020 1559   PROT 8.3 (H) 09/15/2013 1144   ALBUMIN 2.4 (L) 01/08/2022 0822   ALBUMIN 3.9 10/12/2020 1559   ALBUMIN 3.3 (L) 09/15/2013 1144   AST 21 01/08/2022 0822   AST 20 09/15/2013 1144   ALT 8 01/08/2022 0822   ALT 31 09/15/2013 1144   ALKPHOS 211 (H) 01/08/2022 0822   ALKPHOS 117 (H) 09/15/2013 1144   BILITOT 0.3 01/08/2022 0822   BILITOT 0.2 10/12/2020 1559   BILITOT 0.2 09/15/2013 1144   GFRNONAA 53 (L) 01/08/2022 0822   GFRNONAA 57 (L) 10/07/2013 1041   GFRAA >60 04/12/2018 0446   GFRAA >60 10/07/2013 1041    Lipid Panel     Component Value Date/Time   CHOL 152 10/12/2020 1559   TRIG 211 (H) 10/12/2020 1559   HDL 53 10/12/2020 1559   CHOLHDL 2.9 10/12/2020 1559   CHOLHDL 3.7 06/21/2020 0408   VLDL 22 06/21/2020 0408   LDLCALC 65 10/12/2020 1559     Imaging I have reviewed the images obtained:  CT-scan of the brain: No acute abnormality, but motion degraded exam, scattered small chronic cortical infarcts stable on this scan  CTA head and neck: No LVO  MRI examination of the brain: Pending  Assessment: 71 year old patient with history of dementia, COPD, CAD, cardiomyopathy, CHF, sleep apnea, hypothyroidism, hypertension, anxiety, pacemaker, stroke, recent and recent right femur fracture with repair presents after having a seizure  at home as well as an additional seizure on route to the hospital with EMS as well as right gaze deviation.  Upon arrival, patient was noted to have right gaze deviation, was not responsive to voice could not follow commands and moved all extremities restlessly with antigravity strength in bilateral upper extremities.  While in CT scanner, patient's O2 sats were noted to be in the 60s on room air and did not recover with administration of supplemental O2.  Patient was intubated and scans were resumed.  She has a contrast allergy and was premedicated for scan with contrast with Benadryl and Solu-Medrol.  Patient was given 3000 mg of Keppra as well as a 1 L normal saline bolus for hypotension.  CT head revealed no acute abnormality, and CTA head and neck showed no LVO.  CTP showed no core and no penumbra.  Impression: Seizures with postictal state in patient with dementia and history of stroke  Recommendations: -Long-term EEG -Brain MRI -Continue Keppra 1000 mg IV every 12 hours -Neurology will continue to follow  Pt seen by NP/Neuro and later by MD. Note/plan to be edited by MD as needed.  S.N.P.J. , MSN, AGACNP-BC [facility] See Amion for schedule and pager information 01/08/2022 9:31 AM    Attending Neurohospitalist Addendum Patient seen and examined with APP/Resident. Agree with the history and physical as documented above. Agree with the plan as documented, which I helped formulate. I have edited the note above to reflect my full findings and recommendations. I have independently reviewed the chart, obtained history, review of systems and examined the patient.I have personally reviewed pertinent head/neck/spine imaging (CT/MRI). Please feel free to call with any questions.  This patient is critically ill and at significant risk of neurological worsening, death and care requires constant monitoring of vital signs, hemodynamics,respiratory and cardiac monitoring,  neurological assessment, discussion with family, other specialists and medical decision making of high complexity. I spent 105 minutes of neurocritical care time  in the care of  this patient. This was time spent independent of any time provided by nurse practitioner or PA.  Su Monks, MD [facility] [phone]  If 7pm- 7am, please page neurology on call as listed in [place].

## 2022-01-08 NOTE — ED Provider Notes (Signed)
[facility] EMERGENCY DEPARTMENT Provider Note   CSN: [id number] Arrival date & time: 01/08/22  3716  An emergency department physician performed an initial assessment on this suspected stroke patient at 0817.  History  Chief Complaint  Patient presents with   Code Stroke    Stacy Grimes is a 71 y.o. female.  HPI Patient presents for altered mental status.  Medical history includes OSA, HTN, pacemaker, COPD, T2DM, CVA, CHF, dementia.  Per chart review, she was hospitalized 1 month ago for femur fracture.  At time of discharge, has been declined rehab.  At baseline, she is oriented to person only.  Patient was reportedly last seen well at 1:30 AM.  EMS reports witnessed seizure during transit.  Versed was given prior to arrival.  History per husband: Patient was in her normal state of health yesterday.  At 1:30 AM, he felt convulsive movements while laying in bed next to her.  She had snoring, abnormal respirations.  She was unresponsive.  She has remained unresponsive throughout the morning.  He does state that she had a prior seizure episode last September.    Home Medications Prior to Admission medications   Medication Sig Start Date End Date Taking? Authorizing Provider  albuterol (ACCUNEB) 0.63 MG/3ML nebulizer solution Take 1 ampule by nebulization every 6 (six) hours as needed for wheezing.    [provider]  albuterol (PROVENTIL HFA;VENTOLIN HFA) 108 (90 Base) MCG/ACT inhaler Inhale 2 puffs into the lungs every 6 (six) hours as needed. 11/02/16   Schaevitz, Randall An, MD  allopurinol (ZYLOPRIM) 100 MG tablet Take 0.5 tablets (50 mg total) by mouth daily. 12/13/21   Dwyane Dee, MD  atorvastatin (LIPITOR) 80 MG tablet Take 1 tablet (80 mg total) by mouth daily. 07/12/21 07/07/22  Wellington Hampshire, MD  budesonide-formoterol (SYMBICORT) 160-4.5 MCG/ACT inhaler Inhale 2 puffs into the lungs 2 (two) times daily.    [provider]   clopidogrel (PLAVIX) 75 MG tablet Take 1 tablet (75 mg total) by mouth daily. 07/12/21   Wellington Hampshire, MD  diclofenac Sodium (VOLTAREN) 1 % GEL Apply 2 g topically 4 (four) times daily. 11/15/21   [provider]  digoxin (LANOXIN) 0.125 MG tablet TAKE 1 TABLET BY MOUTH ON MONDAY, [facility] AND FRIDAY 10/10/21   Wellington Hampshire, MD  donepezil (ARICEPT) 10 MG tablet Take 10 mg by mouth at bedtime.    [provider]  furosemide (LASIX) 20 MG tablet Take 1 tablet (20 mg total) by mouth every other day. May take an additional tablet AS NEEDED for weight gain of 3 lbs overnight or 5 lbs in one week. Do not exceed more than 3 additional doses. - Oral Patient not taking: Reported on 12/10/2021 10/10/21   Wellington Hampshire, MD  gabapentin (NEURONTIN) 400 MG capsule Take 1 capsule (400 mg total) by mouth 3 (three) times daily. 10/30/21   Sidney Ace, MD  hydrOXYzine (VISTARIL) 25 MG capsule Take 25 mg by mouth at bedtime as needed for anxiety.    [provider]  levothyroxine (SYNTHROID) 112 MCG tablet Take 112 mcg by mouth daily before breakfast.    [provider]  losartan (COZAAR) 25 MG tablet Take 0.5 tablets (12.5 mg total) by mouth daily. 07/12/21   Wellington Hampshire, MD  memantine (NAMENDA) 10 MG tablet Take 10 mg by mouth in the morning and at bedtime. 07/10/21   [provider]  Menthol, Topical Analgesic, (BENGAY EX) Apply 1  application topically daily as needed (back pain).    [provider]  metFORMIN (GLUCOPHAGE-XR) 500 MG 24 hr tablet Take 500 mg by mouth 2 (two) times daily with a meal.     [provider]  methocarbamol (ROBAXIN) 750 MG tablet Take 1 tablet (750 mg total) by mouth 3 (three) times daily. 10/30/21   Sidney Ace, MD  metoprolol succinate (TOPROL-XL) 25 MG 24 hr tablet Take 25 mg by mouth daily.    [provider]  mirtazapine (REMERON) 7.5 MG tablet Take 7.5 mg by mouth 2 (two) times daily.  10/10/21   [provider]  omeprazole (PRILOSEC) 20 MG capsule Take 1 capsule (20 mg total) by mouth every morning. 04/12/18   Gladstone Lighter, MD  potassium chloride SA (KLOR-CON M) 20 MEQ tablet Take 1 tablet (20 mEq total) by mouth daily. 07/12/21   Wellington Hampshire, MD  Sennosides (EX-LAX PO) Take 2 tablets by mouth at bedtime as needed (constipation).    [provider]  tiotropium (SPIRIVA) 18 MCG inhalation capsule Place 1 capsule (18 mcg total) into inhaler and inhale daily as needed (shortness of breath). 04/12/18   Gladstone Lighter, MD  venlafaxine XR (EFFEXOR-XR) 75 MG 24 hr capsule Take 75 mg by mouth 2 (two) times daily. 10/21/21   [provider]  vitamin B-12 (CYANOCOBALAMIN) 1000 MCG tablet Take 1,000 mcg by mouth 2 (two) times daily.     [provider]      Allergies    Codeine and Contrast media [iodinated contrast media]    Review of Systems   Review of Systems  Unable to perform ROS: Mental status change    Physical Exam Updated Vital Signs BP 117/72   Pulse 76   Temp (!) 96.8 F (36 C)   Resp (!) 22   Ht 5\' 3"  (1.6 m)   Wt 77.6 kg   SpO2 100%   BMI 30.30 kg/m  Physical Exam Constitutional:      Appearance: She is ill-appearing. She is not diaphoretic.  HENT:     Head: Atraumatic.     Right Ear: External ear normal.     Left Ear: External ear normal.     Nose: Nose normal.     Mouth/Throat:     Mouth: Mucous membranes are moist.     Comments: Abrasion to anterior tongue.  Edentulous. Eyes:     Comments: Gaze deviation  Cardiovascular:     Rate and Rhythm: Regular rhythm. Tachycardia present.  Pulmonary:     Effort: Tachypnea and accessory muscle usage present.     Breath sounds: Decreased air movement present. No wheezing.  Abdominal:     General: There is no distension.     Palpations: Abdomen is soft.     Tenderness: There is no abdominal tenderness.  Musculoskeletal:     Cervical back: Neck supple.      Right lower leg: No edema.     Left lower leg: No edema.  Skin:    Coloration: Skin is pale. Skin is not jaundiced.  Neurological:     GCS: GCS eye subscore is 2. GCS verbal subscore is 1. GCS motor subscore is 5.     Comments: Moving all extremities     ED Results / Procedures / Treatments   Labs (all labs ordered are listed, but only abnormal results are displayed) Labs Reviewed  CBC - Abnormal; Notable for the following components:      Result Value  Hemoglobin 10.4 (*)    HCT 34.4 (*)    RDW 22.0 (*)    All other components within normal limits  COMPREHENSIVE METABOLIC PANEL - Abnormal; Notable for the following components:   Potassium 3.2 (*)    CO2 18 (*)    Glucose, Bld 206 (*)    Creatinine, Ser 1.11 (*)    Calcium 7.0 (*)    Albumin 2.4 (*)    Alkaline Phosphatase 211 (*)    GFR, Estimated 53 (*)    Anion gap 20 (*)    All other components within normal limits  I-STAT CHEM 8, ED - Abnormal; Notable for the following components:   Potassium 3.3 (*)    Glucose, Bld 202 (*)    Calcium, Ion 0.84 (*)    Hemoglobin 11.9 (*)    HCT 35.0 (*)    All other components within normal limits  CBG MONITORING, ED - Abnormal; Notable for the following components:   Glucose-Capillary 183 (*)    All other components within normal limits  I-STAT ARTERIAL BLOOD GAS, ED - Abnormal; Notable for the following components:   pH, Arterial 7.246 (*)    pCO2 arterial 60.3 (*)    pO2, Arterial 485 (*)    Potassium 2.8 (*)    Calcium, Ion 0.95 (*)    HCT 34.0 (*)    Hemoglobin 11.6 (*)    All other components within normal limits  CULTURE, BLOOD (ROUTINE X 2)  CULTURE, BLOOD (ROUTINE X 2)  URINE CULTURE  PROTIME-INR  APTT  DIFFERENTIAL  ETHANOL  DIGOXIN LEVEL  TSH  LACTIC ACID, PLASMA  LACTIC ACID, PLASMA  URINALYSIS, ROUTINE W REFLEX MICROSCOPIC  MAGNESIUM  BLOOD GAS, ARTERIAL  TROPONIN I (HIGH SENSITIVITY)  TROPONIN I (HIGH SENSITIVITY)    EKG None  Radiology CT  ANGIO HEAD NECK W WO CM W PERF (CODE STROKE)  Result Date: 01/08/2022 CLINICAL DATA:  71 year old female code stroke presentation also with seizure. Possible right MCA deficit clinically. EXAM: CT ANGIOGRAPHY HEAD AND NECK CT PERFUSION BRAIN TECHNIQUE: Multidetector CT imaging of the head and neck was performed using the standard protocol during bolus administration of intravenous contrast. Multiplanar CT image reconstructions and MIPs were obtained to evaluate the vascular anatomy. Carotid stenosis measurements (when applicable) are obtained utilizing NASCET criteria, using the distal internal carotid diameter as the denominator. Multiphase CT imaging of the brain was performed following IV bolus contrast injection. Subsequent parametric perfusion maps were calculated using RAPID software. RADIATION DOSE REDUCTION: This exam was performed according to the departmental dose-optimization program which includes automated exposure control, adjustment of the mA and/or kV according to patient size and/or use of iterative reconstruction technique. CONTRAST:  100 mL Omnipaque 350 COMPARISON:  Plain head CT today. Prior CTA head and neck 06/20/2020. FINDINGS: CT Brain Perfusion Findings: ASPECTS: 10 CBF (<30%) Volume: None.  No CBF or CBV parameters are abnormal. Perfusion (Tmax>6.0s) volume: None Mismatch Volume: Not applicable Infarction Location:Not applicable CTA NECK Skeleton: Advanced chronic cervical spine degeneration appears stable since last year. Chronically absent dentition. No acute osseous abnormality identified. Upper chest: Intubated. Enteric tube courses into the esophagus. Endotracheal tube tip is at the carina. Chronic left chest AICD. Centrilobular emphysema. Mild superimposed dependent atelectasis mostly in the right lung. Small volume retained secretions near the tip of the ET tube. Visible mediastinal lymph nodes are stable and within normal limits. Other neck: Intubated. Oral enteric tube in place.  No acute finding. Aortic arch: Calcified aortic atherosclerosis. 3  vessel arch configuration. Right carotid system: Right CCA and proximal right ICA soft and calcified plaque is mild for age, stable since 2022 and there is no significant stenosis Left carotid system: Some of the proximal left CCA is obscured by dense AICD streak artifact. But similar to the right side left cervical carotid atherosclerosis is stable and mild. No significant stenosis identified. Vertebral arteries: Proximal right subclavian artery is patent with minimal plaque. Right vertebral artery origin remains normal. Right vertebral artery is patent and mildly tortuous to the skull base with no significant plaque or stenosis. Proximal left subclavian artery plaque is mild to moderate, stable without significant stenosis. Left vertebral artery origin is normal. Fairly codominant left vertebral artery is patent to the skull base with no significant plaque or stenosis identified. CTA HEAD Posterior circulation: Mildly dominant right V4 segment. Distal vertebral arteries and vertebrobasilar junction are patent with no significant plaque or stenosis. Normal left PICA origin. As before right AICA may be dominant. Patent basilar artery, AICA origins, SCA and PCA origins with no significant stenosis. Posterior communicating arteries are diminutive or absent. Bilateral PCA branches are stable and within normal limits. Anterior circulation: Both ICA siphons are patent. Mild to moderate bilateral siphon calcified plaque. No significant stenosis on the left. On the right side there is a mild to moderate pre cavernous stenosis at the junction of the petrous and cavernous segments on series 13, image 124 which is not significantly changed. Patent carotid termini, MCA and ACA origins. Normal anterior communicating artery. Bilateral ACA branches are stable and within normal limits. Left MCA M1 segment and trifurcation are patent without stenosis. Left MCA  branches appear stable and within normal limits. Right MCA M1 segment is patent to a bifurcation with a small chronic saccular aneurysm at the anterior temporal artery origin best seen series 12, image 35. This lesion is 2-3 mm (series 10, image 89) and chronic although more conspicuous this year probably due to better contrast timing (series 7, image 90 last year). No associated stenosis. Right MCA bifurcation is patent and right MCA branches appear stable and within normal limits. Venous sinuses: Early contrast timing, grossly patent. Anatomic variants: Mildly dominant right vertebral artery. Review of the MIP images confirms the above findings IMPRESSION: 1. Negative for large vessel occlusion.  Negative CT Perfusion. 2. 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 Recommend Neuro Endovascular follow-up to evaluate the appropriateness of potential treatment. 3. But generally mild for age atherosclerosis in the head and neck, stable since last year. There is mild to moderate right ICA siphon pre cavernous stenosis. 4. Aortic Atherosclerosis (ICD10-I70.0) and Emphysema (ICD10-J43.9). Salient findings were communicated to Dr. Quinn Axe at 10:04 am on 01/08/2022 by text page via the [facility] messaging system. Electronically Signed   By: Genevie Ann M.D.   On: 01/08/2022 10:06   DG Abdomen 1 View  Result Date: 01/08/2022 CLINICAL DATA:  71 year old female with possible sepsis. Code stroke presentation. EXAM: ABDOMEN - 1 VIEW COMPARISON:  Portable chest today reported separately. Abdominal radiographs 03/06/2013. FINDINGS: Portable AP supine view at 0906 hours. Patient is rotated to the right. Enteric tube placed into the stomach, side hole at the level of the gastric body. Tip is in the right upper quadrant. Lung bases appear clear. Non obstructed bowel gas pattern. No acute osseous abnormality identified. IMPRESSION: Enteric tube side hole at the  gastric body level. Tip may be in the distal  stomach or proximal duodenum. Nonobstructed visible bowel gas pattern. Electronically Signed   By: Genevie Ann M.D.   On: 01/08/2022 09:18   DG Chest Port 1 View  Result Date: 01/08/2022 CLINICAL DATA:  71 year old female with possible sepsis. Code stroke presentation. EXAM: PORTABLE CHEST 1 VIEW COMPARISON:  Portable chest 12/10/2021 and earlier. FINDINGS: Portable AP semi upright view at 0904 hours. Rotated to the right today. Intubated. Endotracheal tube tip in good position between the clavicles and carina. Enteric tube placed into the stomach, loops in the visible abdomen. Stable left chest AICD. Stable cardiac size and mediastinal contours. Mildly lower lung volumes. But Allowing for portable technique the lungs are clear. Stable visualized osseous structures. IMPRESSION: 1. Satisfactory ET tube and enteric tube placement. 2. No acute cardiopulmonary abnormality. Electronically Signed   By: Genevie Ann M.D.   On: 01/08/2022 09:16   CT HEAD CODE STROKE WO CONTRAST  Result Date: 01/08/2022 CLINICAL DATA:  Code stroke.  71 year old female. EXAM: CT HEAD WITHOUT CONTRAST TECHNIQUE: Contiguous axial images were obtained from the base of the skull through the vertex without intravenous contrast. RADIATION DOSE REDUCTION: This exam was performed according to the departmental dose-optimization program which includes automated exposure control, adjustment of the mA and/or kV according to patient size and/or use of iterative reconstruction technique. COMPARISON:  12/10/2021. FINDINGS: Brain: Intermittent motion artifact. Stable cerebral volume. No midline shift, mass effect, or evidence of intracranial mass lesion. No ventriculomegaly. Chronic cortical encephalomalacia in the left parietal lobe and occipital pole appears stable from last month. Patchy and confluent additional bilateral white matter hypodensity with some deep white matter capsule involvement. Small area of  right middle frontal gyrus cortical encephalomalacia appears stable, chronic series 2, image 22. No acute intracranial hemorrhage identified. No acute cortically based infarct identified. Vascular: No suspicious intracranial vascular hyperdensity. Degraded Skull: Motion artifact at the skull base. No acute osseous abnormality identified. Sinuses/Orbits: Left sphenoid sinus fluid and mucosal thickening persists. Other Visualized paranasal sinuses and mastoids are clear. Other: Some rightward gaze. Otherwise negative orbit and scalp soft tissues. ASPECTS [facility] Stroke Program Early CT Score) Total score (0-10 with 10 being normal): 10 (chronic encephalomalacia suspected) IMPRESSION: 1. Motion degraded exam. Scattered bilateral small cortical infarcts appear stable since last month. Bilateral white matter disease. No acute cortically based infarct or acute intracranial hemorrhage identified. ASPECTS 10. 2. These results were communicated to Dr. Quinn Axe at 8:35 am on 01/08/2022 by text page via the [facility] messaging system. Electronically Signed   By: Genevie Ann M.D.   On: 01/08/2022 08:36    Procedures Procedure Name: Intubation Date/Time: 01/08/2022 8:51 AM  Performed by: Godfrey Pick, MDPre-anesthesia Checklist: Patient identified, Emergency Drugs available, Patient being monitored and Suction available Oxygen Delivery Method: Ambu bag Preoxygenation: Pre-oxygenation with 100% oxygen Induction Type: Rapid sequence Ventilation: Mask ventilation without difficulty Laryngoscope Size: Glidescope and 4 Grade View: Grade I Tube size: 7.5 mm Number of attempts: 1 Airway Equipment and Method: Rigid stylet and Video-laryngoscopy Placement Confirmation: ETT inserted through vocal cords under direct vision, Positive ETCO2, CO2 detector and Breath sounds checked- equal and bilateral Secured at: 25 cm Tube secured with: ETT holder Dental Injury: Teeth and Oropharynx as per pre-operative assessment          Medications Ordered in ED Medications  sodium chloride flush (NS) 0.9 % injection 3 mL (3 mLs Intravenous Not Given 01/08/22 0903)  diphenhydrAMINE (BENADRYL) capsule 50 mg (has no administration in time range)    Or  diphenhydrAMINE (BENADRYL) injection 50 mg (has no administration in time range)  etomidate (AMIDATE) injection (20 mg Intravenous Given 01/08/22 0841)  rocuronium (ZEMURON) injection (90 mg Intravenous Given 01/08/22 0842)  fentaNYL (SUBLIMAZE) injection 25 mcg (25 mcg Intravenous Not Given 01/08/22 1056)  fentaNYL 2596mcg in NS 276mL (37mcg/ml) infusion-PREMIX (100 mcg/hr Intravenous Rate/Dose Change 01/08/22 1000)  fentaNYL (SUBLIMAZE) bolus via infusion 25-100 mcg (has no administration in time range)  midazolam (VERSED) injection 1-2 mg (has no administration in time range)  lactated ringers infusion ( Intravenous Not Given 01/08/22 1111)  levETIRAcetam (KEPPRA) IVPB 1000 mg/100 mL premix (has no administration in time range)  potassium chloride 10 mEq in 100 mL IVPB (10 mEq Intravenous New Bag/Given 01/08/22 1046)  calcium gluconate 1 g/ 50 mL sodium chloride IVPB (1,000 mg Intravenous New Bag/Given 01/08/22 1043)  amiodarone (NEXTERONE) 150-4.21 MG/100ML-% bolus (  Not Given 01/08/22 1046)  potassium chloride (KLOR-CON) packet 40 mEq (has no administration in time range)  atorvastatin (LIPITOR) tablet 80 mg (has no administration in time range)  albuterol (PROVENTIL) (2.5 MG/3ML) 0.083% nebulizer solution 2.5 mg (has no administration in time range)  budesonide (PULMICORT) nebulizer solution 0.5 mg (has no administration in time range)  arformoterol (BROVANA) nebulizer solution 15 mcg (has no administration in time range)  clopidogrel (PLAVIX) tablet 75 mg (has no administration in time range)  levothyroxine (SYNTHROID) tablet 112 mcg (has no administration in time range)  ipratropium (ATROVENT) nebulizer solution 0.5 mg (has no administration in time range)  insulin aspart  (novoLOG) injection 0-15 Units (has no administration in time range)  heparin injection 5,000 Units (has no administration in time range)  pantoprazole (PROTONIX) injection 40 mg (has no administration in time range)  lactated ringers infusion (has no administration in time range)  docusate sodium (COLACE) capsule 100 mg (has no administration in time range)  polyethylene glycol (MIRALAX / GLYCOLAX) packet 17 g (has no administration in time range)  levETIRAcetam (KEPPRA) IVPB 1500 mg/ 100 mL premix (0 mg Intravenous Stopped 01/08/22 0910)    And  levETIRAcetam (KEPPRA) IVPB 1500 mg/ 100 mL premix (0 mg Intravenous Stopped 01/08/22 0855)  methylPREDNISolone sodium succinate (SOLU-MEDROL) 125 mg/2 mL injection 125 mg (125 mg Intravenous Given 01/08/22 0825)  sodium chloride 0.9 % bolus 1,000 mL (0 mLs Intravenous Stopped 01/08/22 1000)  LORazepam (ATIVAN) injection 2 mg (2 mg Intravenous Given 01/08/22 0833)  lactated ringers bolus 1,000 mL (1,000 mLs Intravenous New Bag/Given 01/08/22 1047)  iohexol (OMNIPAQUE) 350 MG/ML injection 100 mL (100 mLs Intravenous Contrast Given 01/08/22 0950)    ED Course/ Medical Decision Making/ A&P                           Medical Decision Making Amount and/or Complexity of Data Reviewed Labs: ordered. Radiology: ordered.  Risk Prescription drug management. Decision regarding hospitalization.   This patient presents to the ED for concern of altered mental status, this involves an extensive number of treatment options, and is a complaint that carries with it a high risk of complications and morbidity.  The differential diagnosis includes CVA, ICH, seizure, infection, metabolic derangements   Co morbidities that complicate the patient evaluation  OSA, HTN, pacemaker, COPD, T2DM, CVA, CHF, dementia   Additional history obtained:  Additional history obtained from EMS, patient's husband External records from outside source obtained and reviewed including  EMR   Lab Tests:  I Ordered, and personally interpreted labs.  The pertinent results include:  Initial lab work is notable for hypocalcemia and hypokalemia.  Blood gas shows a respiratory acidosis.  Anemia is baseline.  No leukocytosis present.  Remaining labs are pending at time of admission.   Imaging Studies ordered:  I ordered imaging studies including CT head, chest x-ray I independently visualized and interpreted imaging which showed no acute findings I agree with the radiologist interpretation   Cardiac Monitoring: / EKG:  The patient was maintained on a cardiac monitor.  I personally viewed and interpreted the cardiac monitored which showed an underlying rhythm of: Wide-complex tachycardia on arrival, subsequently converted to normal sinus rhythm   Consultations Obtained:  I requested consultation with the neurologist, Dr. Quinn Axe,  and discussed lab and imaging findings as well as pertinent plan - they recommend: Admission to ICU with continuous EEG I requested consultation with the cardiologist,  and discussed lab and imaging findings as well as pertinent plan - they recommend: Amiodarone, cards will continue to follow I requested consultation with the critical care,  and discussed lab and imaging findings as well as pertinent plan - they recommend: Admission to ICU  Problem List / ED Course / Critical interventions / Medication management  Patient arrives as a code stroke after being found altered at home.  Last known well was reportedly 1:30 AM.  On arrival, patient is a GCS of 8.  She is tachypneic and tachycardic.  At time of arrival, patient maintaining a patent airway.  SpO2 is 92%.  When placed on CT scanner, she remained mildly agitated.  Ativan was ordered to help with agitation.  At this time, she was off of oxygen and SpO2 dropped to 65%.  She is placed back on supplemental oxygen.  BVM was used to assist with breathing.  She was taken back to trauma room for emergent  intubation.  Postintubation sedation provided with fentanyl and Versed.  Following intubation, patient taken to CT scanner for stroke imaging studies.  On EKG, patient has a wide-complex tachycardia with no clear P waves.  I discussed this with cardiology who does recommend amiodarone at this time.  Cardiology will continue to follow.  Amiodarone was ordered.  Initial lab work showed hypokalemia and hypocalcemia.  Electrolyte replacement was ordered.  On CT imaging, no stroke is identified.  Neurology recommends ICU admission for continuous EEG.  While in the ED, patient subsequently had conversion to normal sinus rhythm with a normal heart rate.  Order for amiodarone was discontinued.  On further lab work, no leukocytosis is present.  No antibiotics were ordered at this time.  Patient was admitted to ICU for further management. I ordered medication including calcium gluconate and potassium chloride for electrolyte replacement, IVF for tachycardia; fentanyl for postintubation sedation, etomidate and rocuronium for RSI Reevaluation of the patient after these medicines showed that the patient improved I have reviewed the patients home medicines and have made adjustments as needed   Social Determinants of Health:  Lives at home with family, has dementia, A&O x 1 at baseline  CRITICAL CARE Performed by: Godfrey Pick   Total critical care time: 65 minutes  Critical care time was exclusive of separately billable procedures and treating other patients.  Critical care was necessary to treat or prevent imminent or life-threatening deterioration.  Critical care was time spent personally by me on the following activities: development of treatment plan with patient and/or surrogate as well as nursing, discussions with consultants, evaluation of patient's response to treatment, examination of patient, obtaining history from patient or  surrogate, ordering and performing treatments and interventions, ordering and  review of laboratory studies, ordering and review of radiographic studies, pulse oximetry and re-evaluation of patient's condition.          Final Clinical Impression(s) / ED Diagnoses Final diagnoses:  Seizure ([place])  Acute on chronic respiratory failure with hypoxia and hypercapnia [facility])    Rx / [place] Orders ED Discharge Orders     None         Godfrey Pick, MD 01/08/22 1115

## 2022-01-08 NOTE — Progress Notes (Signed)
Pt transported to CT and back to Trauma B without complications.

## 2022-01-08 NOTE — ED Triage Notes (Signed)
Pt BIB [facility] for eval as a code stroke. LKN 0130. Husband awoke this AM to pt having a seizure w/ R sided gaze deviation at that time. Activated 911. Seizure en route w/ EMS as well, versed admin en route to ED. Pt arrives to ED altered, moaning w/out meaningful words, tense L arm. Taken immediately to CT scanner.

## 2022-01-08 NOTE — H&P (Signed)
NAME:  Stacy Grimes, MRN:  [id number], DOB:  1950/03/06, LOS: 0 ADMISSION DATE:  01/08/2022, CONSULTATION DATE:  01/08/22 REFERRING MD:  Doren Custard CHIEF COMPLAINT:  Seizures, AMS   History of Present Illness:  Stacy Grimes is a 71 y.o. female who has a PMH as below. She was last in her usual state of health around 130 AM on 12/5 when she was up and talking. Her husband later heard a noise and shaking "like an earthquake". He woke up and found her snoring with abnormal respirations, right sided gaze deviation, some bleeding from right side of mouth. He called EMS who reported that en route to ED, she had a witnessed seizure event that abated after Versed administration.  In ED, she was taken to CT scanner and while there, she had hypoxia and respiratory distress requiring intubation. CT was negative. She was seen by neurology and loaded with Keppra while LTM was being connected. During CT, she had an episode of wide complex tachycardia that responded spontaneously with fluids. She was found to be hypokalemic and hypocalcemic and these were repleted.  PCCM called for admission.  No recent illness. Had recent admission 12/10/21 through 12/13/21 after fall where she was found to have nondisplaced fx of right supracondylar femur with severe DJD of the right knee following ORIF of right hip fx 10/23/21 She was seen by ortho who recommended conservative measures and NWB status. Besides that, nothing out of the ordinary over the last several days to weeks per her husband.  Pertinent  Medical History:  has SLEEP APNEA, OBSTRUCTIVE; HYPERTENSION, BENIGN; Dilated cardiomyopathy ([place]); PALPITATIONS; MURMUR; COUGH; 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; Personal history of colonic polyps; Dysphagia, unspecified(787.20); Unspecified constipation; Fatigue; Benign neoplasm of colon; Diverticulosis of colon (without mention of hemorrhage); Syncope; Dehydration; Elevated troponin I level; Left  knee pain; Chronic obstructive pulmonary disease, unspecified COPD type ([place]); Type 2 diabetes mellitus with hyperglycemia, unspecified whether long term insulin use ([place]); Acute ischemic stroke ([place]); Chronic congestive heart failure ([place]); Orthostatic hypotension; Hip fracture, unspecified laterality, closed, initial encounter ([place]); Hip fracture ([place]); CHF (congestive heart failure) ([place]); Shock circulatory ([place]); Femur fracture ([place]); Fall; Dementia [facility]); COVID-19 virus infection; and Seizures ([place]) on their problem list.  Significant Hospital Events: Including procedures, antibiotic start and stop dates in addition to other pertinent events   12/5 admit.  Interim History / Subjective:  Sedated. LTM being hooked up.  Objective:  Blood pressure 117/72, pulse 76, temperature (!) 96.8 F (36 C), resp. rate (!) 22, height 5\' 3"  (1.6 m), weight 77.6 kg, SpO2 100 %.    Vent Mode: PRVC FiO2 (%):  [60 %-100 %] 60 % Set Rate:  [18 bmp-22 bmp] 22 bmp Vt Set:  [410 mL] 410 mL PEEP:  [5 cmH20] 5 cmH20 Plateau Pressure:  [14 cmH20] 14 cmH20   Intake/Output Summary (Last 24 hours) at 01/08/2022 1125 Last data filed at 01/08/2022 1000 Gross per 24 hour  Intake 1200 ml  Output --  Net 1200 ml   Filed Weights   01/08/22 0800  Weight: 77.6 kg    Examination: General: Adult female, resting in bed, in NAD. Neuro: Sedated, not responsive. HEENT: [place]/AT. Sclerae anicteric. ETT in place. Cardiovascular: RRR, no M/R/G.  Lungs: Respirations even and unlabored.  CTA bilaterally, No W/R/R. Abdomen: BS x 4, soft, NT/ND.  Musculoskeletal: No gross deformities, no edema.  Skin: Intact, warm, no rashes.  Labs/imaging personally reviewed:  CT head 12/5 > scattered bilateral small cortical infarcts that are  stable since prior exam. No acute process. LTM 12/5 >    Assessment & Plan:   Seizures with possible status - resolved after Versed and Keppra load. Unclear whether she had or continues to have  NCSE. - Neuro consulted, appreciate the assistance. - Continue LTM and AED's per neuro. - Possible MRI if able (has ICD), will defer to neuro.  Non-sustained WCT - resolved after fluids. Presumed precipitated by electrolyte derangements. - EP consulted, appreciate the assistance. - Add additional K repletion, goal K > 4 and Mg > 2.  Acute on chronic hypoxic respiratory failure with inability to protect the airway - s/p intubation. Hx COPD on 3-4L O2 per report. - Full vent support. - Wean as able. - Bronchial hygiene. - BD's. - CXR intermittently.  Hypokalemia - being repleted. Hypocalcemia - being repleted. - Follow BMP. - Goal K > 4, Mg > 2.  Hx AAA, dCHF, HTN, NICM with EF 20-25% s/p ICD. - Continue PTA Atorvastatin, Plavix. - Hold PTA Digoxin, Lasix, Losartan, Toprol-XL.  Hx DM. - SSI. - Hold PTA Metformin.  Hx hypothyroidism. - Continue PTA Synthroid.  Hx small infarcts, depression, dementia. - Hold PTA Donepezil, Gabapentin, Hydroxyzine, Memantine, Methocarbamol, Venlafaxine.  Best practice (evaluated daily):  Diet/type: NPO DVT prophylaxis: prophylactic heparin  GI prophylaxis: PPI Lines: N/A Foley:  N/A Code Status:  full code Last date of multidisciplinary goals of care discussion: None yet.  Labs   CBC: Recent Labs  Lab 01/08/22 0822 01/08/22 0826 01/08/22 1006  WBC 8.1  --   --   NEUTROABS 6.7  --   --   HGB 10.4* 11.9* 11.6*  HCT 34.4* 35.0* 34.0*  MCV 88.9  --   --   PLT 267  --   --     Basic Metabolic Panel: Recent Labs  Lab 01/08/22 0822 01/08/22 0826 01/08/22 1006  NA 143 145 143  K 3.2* 3.3* 2.8*  CL 105 105  --   CO2 18*  --   --   GLUCOSE 206* 202*  --   BUN 8 8  --   CREATININE 1.11* 1.00  --   CALCIUM 7.0*  --   --    GFR: Estimated Creatinine Clearance: 50.2 mL/min (by C-G formula based on SCr of 1 mg/dL). Recent Labs  Lab 01/08/22 0822  WBC 8.1    Liver Function Tests: Recent Labs  Lab 01/08/22 0822  AST 21   ALT 8  ALKPHOS 211*  BILITOT 0.3  PROT 6.5  ALBUMIN 2.4*   No results for input(s): "LIPASE", "AMYLASE" in the last 168 hours. No results for input(s): "AMMONIA" in the last 168 hours.  ABG    Component Value Date/Time   PHART 7.246 (L) 01/08/2022 1006   PCO2ART 60.3 (H) 01/08/2022 1006   PO2ART 485 (H) 01/08/2022 1006   HCO3 26.2 01/08/2022 1006   TCO2 28 01/08/2022 1006   ACIDBASEDEF 2.0 01/08/2022 1006   O2SAT 100 01/08/2022 1006     Coagulation Profile: Recent Labs  Lab 01/08/22 0822  INR 1.0    Cardiac Enzymes: No results for input(s): "CKTOTAL", "CKMB", "CKMBINDEX", "TROPONINI" in the last 168 hours.  HbA1C: Hgb A1c MFr Bld  Date/Time Value Ref Range Status  10/23/2021 04:51 AM 7.0 (H) 4.8 - 5.6 % Final    Comment:    (NOTE) Pre diabetes:          5.7%-6.4%  Diabetes:              >  6.4%  Glycemic control for   <7.0% adults with diabetes   06/21/2020 04:08 AM 7.5 (H) 4.8 - 5.6 % Final    Comment:    (NOTE) Pre diabetes:          5.7%-6.4%  Diabetes:              >6.4%  Glycemic control for   <7.0% adults with diabetes     CBG: Recent Labs  Lab 01/08/22 0819  GLUCAP 183*    Review of Systems:   Unable to obtain as pt is encephalopathic.  Past Medical History:  She,  has a past medical history of AAA (abdominal aortic aneurysm) ([place]), Anginal pain ([place]), Anxiety, Arthritis, Automatic implantable cardioverter-defibrillator in situ, Biventricular ICD  [employer], COPD (chronic obstructive pulmonary disease) ([place]), Deafness, Dementia ([place]), Depression, Dizziness, Dysrhythmia, Fracture, GERD (gastroesophageal reflux disease), HFrEF (heart failure with reduced ejection fraction) ([place]), Hypertension, Hypothyroidism, Non-ischemic cardiomyopathy ([place]), Oxygen dependent, Palpitations, Shortness of breath dyspnea, Sleep apnea, and Wheezing.   Surgical History:   Past Surgical History:  Procedure Laterality Date   ABDOMINAL HYSTERECTOMY     BIV ICD  GENERATOR CHANGEOUT N/A 09/17/2016   Procedure: BiV ICD Generator Changeout;  Surgeon: Deboraha Sprang, MD;  Location: [place] [facility];  Service: Cardiovascular;  Laterality: N/A;   BLADDER SURGERY     CARDIAC CATHETERIZATION     CARDIAC DEFIBRILLATOR PLACEMENT  4 yrs ago   pacemaker   CATARACT EXTRACTION W/PHACO Left 09/01/2014   Procedure: CATARACT EXTRACTION PHACO AND INTRAOCULAR LENS PLACEMENT ([place]);  Surgeon: Lyla Glassing, MD;  Location: [facility];  Service: Ophthalmology;  Laterality: Left;  [place]: 1:12.1    CATARACT EXTRACTION W/PHACO Right 09/29/2014   Procedure: CATARACT EXTRACTION PHACO AND INTRAOCULAR LENS PLACEMENT (IOC);  Surgeon: Lyla Glassing, MD;  Location: [facility] OR[facility];  Service: Ophthalmology;  Laterality: Right;  [place]: 00:52.7    CHOLECYSTECTOMY     COLON SURGERY     COLONOSCOPY     COLONOSCOPY N/A 08/31/2012   Procedure: COLONOSCOPY;  Surgeon: Inda Castle, MD;  Location: [facility];  Service: Endoscopy;  Laterality: N/A;   ESOPHAGOGASTRODUODENOSCOPY N/A 07/06/2012   Procedure: ESOPHAGOGASTRODUODENOSCOPY (EGD);  Surgeon: Lafayette Dragon, MD;  Location: [facility];  Service: Endoscopy;  Laterality: N/A;   INTRAMEDULLARY (IM) NAIL INTERTROCHANTERIC Right 10/23/2021   Procedure: INTRAMEDULLARY (IM) NAIL INTERTROCHANTERIC;  Surgeon: Corky Mull, MD;  Location: [facility];  Service: Orthopedics;  Laterality: Right;   KNEE SURGERY Right    MIDDLE EAR SURGERY     SAVORY DILATION N/A 07/06/2012   Procedure: SAVORY DILATION;  Surgeon: Lafayette Dragon, MD;  Location: [facility];  Service: Endoscopy;  Laterality: N/A;   TEE WITHOUT CARDIOVERSION N/A 06/23/2020   Procedure: TRANSESOPHAGEAL ECHOCARDIOGRAM (TEE);  Surgeon: Minna Merritts, MD;  Location: [facility];  Service: Cardiovascular;  Laterality: N/A;   WRIST SURGERY Right      Social History:   reports that she quit smoking about 19 years ago. Her smoking use included cigarettes. She has a 60.00 pack-year smoking history. She  has never used smokeless tobacco. She reports that she does not drink alcohol and does not use drugs.   Family History:  Her family history includes Breast cancer in her mother; Colon cancer (age of onset: 42) in her brother; Heart attack in her sister and sister; Heart disease in her father; Hypertension in her brother and mother; Stroke in her mother.   Allergies Allergies  Allergen Reactions   Codeine  Palpitations   Contrast Media [Iodinated Contrast Media] Rash     Home Medications  Prior to Admission medications   Medication Sig Start Date End Date Taking? Authorizing Provider  albuterol (ACCUNEB) 0.63 MG/3ML nebulizer solution Take 1 ampule by nebulization every 6 (six) hours as needed for wheezing.    [provider]  albuterol (PROVENTIL HFA;VENTOLIN HFA) 108 (90 Base) MCG/ACT inhaler Inhale 2 puffs into the lungs every 6 (six) hours as needed. 11/02/16   Schaevitz, Randall An, MD  allopurinol (ZYLOPRIM) 100 MG tablet Take 0.5 tablets (50 mg total) by mouth daily. 12/13/21   Dwyane Dee, MD  atorvastatin (LIPITOR) 80 MG tablet Take 1 tablet (80 mg total) by mouth daily. 07/12/21 07/07/22  Wellington Hampshire, MD  budesonide-formoterol (SYMBICORT) 160-4.5 MCG/ACT inhaler Inhale 2 puffs into the lungs 2 (two) times daily.    [provider]  clopidogrel (PLAVIX) 75 MG tablet Take 1 tablet (75 mg total) by mouth daily. 07/12/21   Wellington Hampshire, MD  diclofenac Sodium (VOLTAREN) 1 % GEL Apply 2 g topically 4 (four) times daily. 11/15/21   [provider]  digoxin (LANOXIN) 0.125 MG tablet TAKE 1 TABLET BY MOUTH ON MONDAY, [facility] AND FRIDAY 10/10/21   Wellington Hampshire, MD  donepezil (ARICEPT) 10 MG tablet Take 10 mg by mouth at bedtime.    [provider]  furosemide (LASIX) 20 MG tablet Take 1 tablet (20 mg total) by mouth every other day. May take an additional tablet AS NEEDED for weight gain of 3 lbs overnight or 5 lbs in one week. Do not exceed  more than 3 additional doses. - Oral Patient not taking: Reported on 12/10/2021 10/10/21   Wellington Hampshire, MD  gabapentin (NEURONTIN) 400 MG capsule Take 1 capsule (400 mg total) by mouth 3 (three) times daily. 10/30/21   Sidney Ace, MD  hydrOXYzine (VISTARIL) 25 MG capsule Take 25 mg by mouth at bedtime as needed for anxiety.    [provider]  levothyroxine (SYNTHROID) 112 MCG tablet Take 112 mcg by mouth daily before breakfast.    [provider]  losartan (COZAAR) 25 MG tablet Take 0.5 tablets (12.5 mg total) by mouth daily. 07/12/21   Wellington Hampshire, MD  memantine (NAMENDA) 10 MG tablet Take 10 mg by mouth in the morning and at bedtime. 07/10/21   [provider]  Menthol, Topical Analgesic, (BENGAY EX) Apply 1 application topically daily as needed (back pain).    [provider]  metFORMIN (GLUCOPHAGE-XR) 500 MG 24 hr tablet Take 500 mg by mouth 2 (two) times daily with a meal.     [provider]  methocarbamol (ROBAXIN) 750 MG tablet Take 1 tablet (750 mg total) by mouth 3 (three) times daily. 10/30/21   Sidney Ace, MD  metoprolol succinate (TOPROL-XL) 25 MG 24 hr tablet Take 25 mg by mouth daily.    [provider]  mirtazapine (REMERON) 7.5 MG tablet Take 7.5 mg by mouth 2 (two) times daily. 10/10/21   [provider]  omeprazole (PRILOSEC) 20 MG capsule Take 1 capsule (20 mg total) by mouth every morning. 04/12/18   Gladstone Lighter, MD  potassium chloride SA (KLOR-CON M) 20 MEQ tablet Take 1 tablet (20 mEq total) by mouth daily. 07/12/21   Wellington Hampshire, MD  Sennosides (EX-LAX PO) Take 2 tablets by mouth at bedtime as needed (constipation).    [provider]  tiotropium (SPIRIVA) 18 MCG inhalation capsule Place  1 capsule (18 mcg total) into inhaler and inhale daily as needed (shortness of breath). 04/12/18   Gladstone Lighter, MD  venlafaxine XR (EFFEXOR-XR) 75 MG 24 hr capsule Take 75 mg by mouth 2  (two) times daily. 10/21/21   [provider]  vitamin B-12 (CYANOCOBALAMIN) 1000 MCG tablet Take 1,000 mcg by mouth 2 (two) times daily.     [provider]     Critical care time: 35 min.   Montey Hora, [facility] For pager details, please see AMION or use Epic chat  After 1900, please call [place] for cross coverage needs 01/08/2022, 11:25 AM

## 2022-01-08 NOTE — Consult Note (Signed)
Cardiology Consultation   Patient ID: Stacy Grimes MRN: [id number]; DOB: 1950/05/18  Admit date: 01/08/2022 Date of Consult: 01/08/2022  PCP:  Cyndi Bender, [place] [facility] Cardiologist:  Kathlyn Sacramento, MD  Electrophysiologist:  Virl Axe, MD  {    Patient Profile:   Stacy Grimes is a 71 y.o. female with a hx of NICM (in revieqw of cardiology notes had a cath in 2010 with no obstructive CAD and severely reduced LVEF), severe O2 dependent COPD, dementia (at baseline AAO to self only by record), HTN with known orthostatic hypotension, hypothyroidism, AAA,  who is being seen 01/08/2022 for the evaluation of WCT at the request of Dr. Doren Custard.  [facility]  CRT-D implanted 04/12/2009 This is NOT an MRI conditional system (reviewed with industry)  History of Present Illness:   HPI by chart review Stacy Grimes was discharge early November after a fall associated with R femur fracture, since then minimally if at all mobilizing, she was admitted today and brought via EMS when her husband was woken with a noise, noted her bed shaking and found her having seizure looking activity.   In route another witnessed episode by EMS, also observed to have R gaze deviation.  LAST known well 0130 this AM Neuro on board, they noted not responsive to voice could not follow commands and moved all extremities restlessly with antigravity strength in bilateral upper extremities.  In the scanner she became hypoxic to the 60's, did not recover with supplemental O2 and required intubation She does have contrast allergy and was premedicated  Observed to have WCT as well and EP was asked on board.  LABS K+ 3.2 . 3.3 > 2.8 BUN/Creat 8/1.00 No mag WBC 8/1 H/H 11/34 Plts 267  (Appears to be done after intubation) Ph 7.246 PCO2 60.3 PO2 485 Bicarb 26   Past Medical History:  Diagnosis Date   AAA (abdominal aortic aneurysm) (HCC)    Anginal pain (HCC)    Anxiety     Arthritis    Automatic implantable cardioverter-defibrillator in situ    Biventricular ICD  [employer]    COPD (chronic obstructive pulmonary disease) (HCC)    Deafness    left ear   Dementia (HCC)    Depression    Dizziness    Dysrhythmia    Fracture    history of spinal fracture   GERD (gastroesophageal reflux disease)    HFrEF (heart failure with reduced ejection fraction) ([place])    a. 2010 Cath: nonobs dzs, EF 15-20%; b. 09/2017 Echo: EF suboptimal. EF low nl; c. 04/2018 Echo: EF 40-45%.   Hypertension    Hypothyroidism    Non-ischemic cardiomyopathy ([place])    a. 2010 Cath: nonobs dzs, EF 15-20%; b. 2011 s/p SJM CRT-D; c. 09/2016 s/p Gen change; d. 09/2017 Echo: EF suboptimal. EF low nl; e. 04/2018 Echo: EF 40-45%.   Oxygen dependent    Palpitations    Shortness of breath dyspnea    Sleep apnea    Wheezing     Past Surgical History:  Procedure Laterality Date   ABDOMINAL HYSTERECTOMY     BIV ICD GENERATOR CHANGEOUT N/A 09/17/2016   Procedure: BiV ICD Generator Changeout;  Surgeon: Deboraha Sprang, MD;  Location: [facility];  Service: Cardiovascular;  Laterality: N/A;   BLADDER SURGERY     CARDIAC CATHETERIZATION     CARDIAC DEFIBRILLATOR PLACEMENT  4 yrs ago   pacemaker   CATARACT EXTRACTION W/PHACO Left 09/01/2014  Procedure: CATARACT EXTRACTION PHACO AND INTRAOCULAR LENS PLACEMENT (IOC);  Surgeon: Lyla Glassing, MD;  Location: [facility];  Service: Ophthalmology;  Laterality: Left;  [place]: 1:12.1    CATARACT EXTRACTION W/PHACO Right 09/29/2014   Procedure: CATARACT EXTRACTION PHACO AND INTRAOCULAR LENS PLACEMENT (IOC);  Surgeon: Lyla Glassing, MD;  Location: [facility];  Service: Ophthalmology;  Laterality: Right;  [place]: 00:52.7    CHOLECYSTECTOMY     COLON SURGERY     COLONOSCOPY     COLONOSCOPY N/A 08/31/2012   Procedure: COLONOSCOPY;  Surgeon: Inda Castle, MD;  Location: [facility];  Service: Endoscopy;  Laterality: N/A;   ESOPHAGOGASTRODUODENOSCOPY N/A 07/06/2012    Procedure: ESOPHAGOGASTRODUODENOSCOPY (EGD);  Surgeon: Lafayette Dragon, MD;  Location: [facility];  Service: Endoscopy;  Laterality: N/A;   INTRAMEDULLARY (IM) NAIL INTERTROCHANTERIC Right 10/23/2021   Procedure: INTRAMEDULLARY (IM) NAIL INTERTROCHANTERIC;  Surgeon: Corky Mull, MD;  Location: [facility];  Service: Orthopedics;  Laterality: Right;   KNEE SURGERY Right    MIDDLE EAR SURGERY     SAVORY DILATION N/A 07/06/2012   Procedure: SAVORY DILATION;  Surgeon: Lafayette Dragon, MD;  Location: [facility];  Service: Endoscopy;  Laterality: N/A;   TEE WITHOUT CARDIOVERSION N/A 06/23/2020   Procedure: TRANSESOPHAGEAL ECHOCARDIOGRAM (TEE);  Surgeon: Minna Merritts, MD;  Location: [facility];  Service: Cardiovascular;  Laterality: N/A;   WRIST SURGERY Right      Home Medications:  Prior to Admission medications   Medication Sig Start Date End Date Taking? Authorizing Provider  albuterol (ACCUNEB) 0.63 MG/3ML nebulizer solution Take 1 ampule by nebulization every 6 (six) hours as needed for wheezing.    [provider]  albuterol (PROVENTIL HFA;VENTOLIN HFA) 108 (90 Base) MCG/ACT inhaler Inhale 2 puffs into the lungs every 6 (six) hours as needed. 11/02/16   Schaevitz, Randall An, MD  allopurinol (ZYLOPRIM) 100 MG tablet Take 0.5 tablets (50 mg total) by mouth daily. 12/13/21   Dwyane Dee, MD  atorvastatin (LIPITOR) 80 MG tablet Take 1 tablet (80 mg total) by mouth daily. 07/12/21 07/07/22  Wellington Hampshire, MD  budesonide-formoterol (SYMBICORT) 160-4.5 MCG/ACT inhaler Inhale 2 puffs into the lungs 2 (two) times daily.    [provider]  clopidogrel (PLAVIX) 75 MG tablet Take 1 tablet (75 mg total) by mouth daily. 07/12/21   Wellington Hampshire, MD  diclofenac Sodium (VOLTAREN) 1 % GEL Apply 2 g topically 4 (four) times daily. 11/15/21   [provider]  digoxin (LANOXIN) 0.125 MG tablet TAKE 1 TABLET BY MOUTH ON MONDAY, [facility] AND FRIDAY 10/10/21   Wellington Hampshire, MD   donepezil (ARICEPT) 10 MG tablet Take 10 mg by mouth at bedtime.    [provider]  furosemide (LASIX) 20 MG tablet Take 1 tablet (20 mg total) by mouth every other day. May take an additional tablet AS NEEDED for weight gain of 3 lbs overnight or 5 lbs in one week. Do not exceed more than 3 additional doses. - Oral Patient not taking: Reported on 12/10/2021 10/10/21   Wellington Hampshire, MD  gabapentin (NEURONTIN) 400 MG capsule Take 1 capsule (400 mg total) by mouth 3 (three) times daily. 10/30/21   Sidney Ace, MD  hydrOXYzine (VISTARIL) 25 MG capsule Take 25 mg by mouth at bedtime as needed for anxiety.    [provider]  levothyroxine (SYNTHROID) 112 MCG tablet Take 112 mcg by mouth daily before breakfast.    [provider]  losartan (COZAAR) 25 MG  tablet Take 0.5 tablets (12.5 mg total) by mouth daily. 07/12/21   Wellington Hampshire, MD  memantine (NAMENDA) 10 MG tablet Take 10 mg by mouth in the morning and at bedtime. 07/10/21   [provider]  Menthol, Topical Analgesic, (BENGAY EX) Apply 1 application topically daily as needed (back pain).    [provider]  metFORMIN (GLUCOPHAGE-XR) 500 MG 24 hr tablet Take 500 mg by mouth 2 (two) times daily with a meal.     [provider]  methocarbamol (ROBAXIN) 750 MG tablet Take 1 tablet (750 mg total) by mouth 3 (three) times daily. 10/30/21   Sidney Ace, MD  metoprolol succinate (TOPROL-XL) 25 MG 24 hr tablet Take 25 mg by mouth daily.    [provider]  mirtazapine (REMERON) 7.5 MG tablet Take 7.5 mg by mouth 2 (two) times daily. 10/10/21   [provider]  omeprazole (PRILOSEC) 20 MG capsule Take 1 capsule (20 mg total) by mouth every morning. 04/12/18   Gladstone Lighter, MD  potassium chloride SA (KLOR-CON M) 20 MEQ tablet Take 1 tablet (20 mEq total) by mouth daily. 07/12/21   Wellington Hampshire, MD  Sennosides (EX-LAX PO) Take 2 tablets by mouth at bedtime as needed  (constipation).    [provider]  tiotropium (SPIRIVA) 18 MCG inhalation capsule Place 1 capsule (18 mcg total) into inhaler and inhale daily as needed (shortness of breath). 04/12/18   Gladstone Lighter, MD  venlafaxine XR (EFFEXOR-XR) 75 MG 24 hr capsule Take 75 mg by mouth 2 (two) times daily. 10/21/21   [provider]  vitamin B-12 (CYANOCOBALAMIN) 1000 MCG tablet Take 1,000 mcg by mouth 2 (two) times daily.     [provider]    Inpatient Medications: Scheduled Meds:  diphenhydrAMINE  50 mg Oral Once   Or   diphenhydrAMINE  50 mg Intravenous Once   fentaNYL (SUBLIMAZE) injection  25 mcg Intravenous Once   sodium chloride flush  3 mL Intravenous Once   Continuous Infusions:  amiodarone     calcium gluconate 1,000 mg (01/08/22 1043)   fentaNYL infusion INTRAVENOUS 50 mcg/hr (01/08/22 0850)   lactated ringers     levETIRAcetam     potassium chloride 10 mEq (01/08/22 1046)   PRN Meds: amiodarone, etomidate, fentaNYL, midazolam, rocuronium  Allergies:    Allergies  Allergen Reactions   Codeine Palpitations   Contrast Media [Iodinated Contrast Media] Rash    Social History:   Social History   Socioeconomic History   Marital status: Married    Spouse name: Not on file   Number of children: 3   Years of education: Not on file   Highest education level: Not on file  Occupational History   Occupation: Retired    Fish farm manager: DISABLED  Tobacco Use   Smoking status: Former    Packs/day: 3.00    Years: 20.00    Total pack years: 60.00    Types: Cigarettes    Quit date: 02/04/2002    Years since quitting: 19.9   Smokeless tobacco: Never  Vaping Use   Vaping Use: Never used  Substance and Sexual Activity   Alcohol use: No   Drug use: No   Sexual activity: Not on file  Other Topics Concern   Not on file  Social History Narrative   Not on file   Social Determinants of Health   Financial Resource Strain: Not on file  Food Insecurity: No  Food Insecurity (10/24/2021)   Hunger  Vital Sign    Worried About Charity fundraiser in the Last Year: Never true    Ran Out of Food in the Last Year: Never true  Transportation Needs: No Transportation Needs (10/24/2021)   PRAPARE - Hydrologist (Medical): No    Lack of Transportation (Non-Medical): No  Physical Activity: Not on file  Stress: Not on file  Social Connections: Not on file  Intimate Partner Violence: Not At Risk (10/24/2021)   Humiliation, Afraid, Rape, and Kick questionnaire    Fear of Current or Ex-Partner: No    Emotionally Abused: No    Physically Abused: No    Sexually Abused: No    Family History:   Family History  Problem Relation Age of Onset   Hypertension Mother    Breast cancer Mother    Stroke Mother    Heart disease Father    Heart attack Sister    Hypertension Brother    Heart attack Sister    Colon cancer Brother 70     ROS:  Unable to obtain, intubated  Physical Exam/Data:   Vitals:   01/08/22 1015 01/08/22 1017 01/08/22 1030 01/08/22 1045  BP: 106/60  (!) 86/53 108/65  Pulse: 80  78 79  Resp: 18  (!) 22 (!) 22  Temp: (!) 96.7 F (35.9 C)  (!) 96.8 F (36 C) (!) 96.8 F (36 C)  SpO2: 100% 100% 100% 100%  Weight:      Height:        Intake/Output Summary (Last 24 hours) at 01/08/2022 1057 Last data filed at 01/08/2022 1000 Gross per 24 hour  Intake 1200 ml  Output --  Net 1200 ml      01/08/2022    8:00 AM 12/10/2021   12:45 PM 10/30/2021    5:00 AM  Last 3 Weights  Weight (lbs) 171 lb 1.2 oz 179 lb 0.2 oz 205 lb 11 oz  Weight (kg) 77.6 kg 81.2 kg 93.3 kg     Body mass index is 30.3 kg/m.  General:  Well nourished, well developed, intubated HEENT: normal Neck: no JVD Vascular: No carotid bruits Cardiac: RRR; no murmurs, gallops or rubs Lungs:  ET intubated, CTA b/l, no wheezing, rhonchi or rales  Abd: soft, not distended Ext: no edema Musculoskeletal:  No deformities Skin: warm and dry   Neuro:  does not wake to verbal  Psych:  unable to assess  EKG:  The EKG was personally reviewed and demonstrates:    WCT 140bpm (morphology much the same as baseline with widening of QRS)  Old SR 94bpm, nsIVCD  Telemetry:  Telemetry was personally reviewed and demonstrates:   WCT  slows gradually into ST > SR  Relevant CV Studies:   10/23/21: TTE 1. Left ventricular ejection fraction, by estimation, is 40 to 45%. The  left ventricle has mildly decreased function. The left ventricle  demonstrates global hypokinesis. There is mild left ventricular  hypertrophy. Indeterminate diastolic filling due to  E-A fusion.   2. Right ventricular systolic function is mildly reduced. The right  ventricular size is moderately enlarged. There is severely elevated  pulmonary artery systolic pressure.   3. The mitral valve is normal in structure. Trivial mitral valve  regurgitation.   4. Tricuspid valve regurgitation is mild to moderate.   5. The aortic valve is tricuspid. Aortic valve regurgitation is not  visualized. No aortic stenosis is present.   6. The inferior vena cava is normal in size  with <50% respiratory  variability, suggesting right atrial pressure of 8 mmHg.   Comparison(s): A prior study was performed on 06/22/2020. LVEF is  significantly higher. Right ventricle appears dilated and hypokinetic on  today's study.   FINDINGS   Left Ventricle: Left ventricular ejection fraction, by estimation, is 40  to 45%. The left ventricle has mildly decreased function. The left  ventricle demonstrates global hypokinesis. Definity contrast agent was  given IV to delineate the left ventricular   endocardial borders. The left ventricular internal cavity size was normal  in size. There is mild left ventricular hypertrophy. Indeterminate  diastolic filling due to E-A fusion.   Right Ventricle: The right ventricular size is moderately enlarged. No  increase in right ventricular wall  thickness. Right ventricular systolic  function is mildly reduced. There is severely elevated pulmonary artery  systolic pressure. The tricuspid  regurgitant velocity is 4.07 m/s, and with an assumed right atrial  pressure of 8 mmHg, the estimated right ventricular systolic pressure is  10.1 mmHg.   Left Atrium: Left atrial size was normal in size.   Right Atrium: Right atrial size was normal in size.   Pericardium: There is no evidence of pericardial effusion.   Mitral Valve: The mitral valve is normal in structure. Trivial mitral  valve regurgitation.   Tricuspid Valve: The tricuspid valve is normal in structure. Tricuspid  valve regurgitation is mild to moderate.   Aortic Valve: The aortic valve is tricuspid. Aortic valve regurgitation is  not visualized. No aortic stenosis is present.   Pulmonic Valve: The pulmonic valve was not well visualized. Pulmonic valve  regurgitation is not visualized. No evidence of pulmonic stenosis.   Aorta: The aortic root is normal in size and structure.   Pulmonary Artery: The pulmonary artery is of normal size.   Venous: The inferior vena cava is normal in size with less than 50%  respiratory variability, suggesting right atrial pressure of 8 mmHg.   IAS/Shunts: The interatrial septum was not well visualized.   Additional Comments: A device lead is visualized in the right atrium and  right ventricle.   Laboratory Data:  High Sensitivity Troponin:   Recent Labs  Lab 12/10/21 1322  TROPONINIHS 23*     Chemistry Recent Labs  Lab 01/08/22 0822 01/08/22 0826 01/08/22 1006  NA 143 145 143  K 3.2* 3.3* 2.8*  CL 105 105  --   CO2 18*  --   --   GLUCOSE 206* 202*  --   BUN 8 8  --   CREATININE 1.11* 1.00  --   CALCIUM 7.0*  --   --   GFRNONAA 53*  --   --   ANIONGAP 20*  --   --     Recent Labs  Lab 01/08/22 0822  PROT 6.5  ALBUMIN 2.4*  AST 21  ALT 8  ALKPHOS 211*  BILITOT 0.3   Lipids No results for input(s): "CHOL",  "TRIG", "HDL", "LABVLDL", "LDLCALC", "CHOLHDL" in the last 168 hours.  Hematology Recent Labs  Lab 01/08/22 0822 01/08/22 0826 01/08/22 1006  WBC 8.1  --   --   RBC 3.87  --   --   HGB 10.4* 11.9* 11.6*  HCT 34.4* 35.0* 34.0*  MCV 88.9  --   --   MCH 26.9  --   --   MCHC 30.2  --   --   RDW 22.0*  --   --   PLT 267  --   --  Thyroid No results for input(s): "TSH", "FREET4" in the last 168 hours.  BNPNo results for input(s): "BNP", "PROBNP" in the last 168 hours.  DDimer No results for input(s): "DDIMER" in the last 168 hours.   Radiology/Studies:  CT ANGIO HEAD NECK W WO CM W PERF (CODE STROKE) Result Date: 01/08/2022 CLINICAL DATA:  71 year old female code stroke presentation also with seizure. Possible right MCA deficit clinically. EXAM: CT ANGIOGRAPHY HEAD AND NECK CT PERFUSION BRAIN TECHNIQUE: Multidetector CT imaging of the head and neck was performed using the standard protocol during bolus administration of intravenous contrast. Multiplanar CT image reconstructions and MIPs were obtained to evaluate the vascular anatomy. Carotid stenosis measurements (when applicable) are obtained utilizing NASCET criteria, using the distal internal carotid diameter as the denominator. Multiphase CT imaging of the brain was performed following IV bolus contrast injection. Subsequent parametric perfusion maps were calculated using RAPID software. RADIATION DOSE REDUCTION: This exam was performed according to the departmental dose-optimization program which includes automated exposure control, adjustment of the mA and/or kV according to patient size and/or use of iterative reconstruction technique. CONTRAST:  100 mL Omnipaque 350 COMPARISON:  Plain head CT today. Prior CTA head and neck 06/20/2020. FINDINGS: CT Brain Perfusion Findings: ASPECTS: 10 CBF (<30%) Volume: None.  No CBF or CBV parameters are abnormal. Perfusion (Tmax>6.0s) volume: None Mismatch Volume: Not applicable Infarction Location:Not  applicable CTA NECK Skeleton: Advanced chronic cervical spine degeneration appears stable since last year. Chronically absent dentition. No acute osseous abnormality identified. Upper chest: Intubated. Enteric tube courses into the esophagus. Endotracheal tube tip is at the carina. Chronic left chest AICD. Centrilobular emphysema. Mild superimposed dependent atelectasis mostly in the right lung. Small volume retained secretions near the tip of the ET tube. Visible mediastinal lymph nodes are stable and within normal limits. Other neck: Intubated. Oral enteric tube in place. No acute finding. Aortic arch: Calcified aortic atherosclerosis. 3 vessel arch configuration. Right carotid system: Right CCA and proximal right ICA soft and calcified plaque is mild for age, stable since 2022 and there is no significant stenosis Left carotid system: Some of the proximal left CCA is obscured by dense AICD streak artifact. But similar to the right side left cervical carotid atherosclerosis is stable and mild. No significant stenosis identified. Vertebral arteries: Proximal right subclavian artery is patent with minimal plaque. Right vertebral artery origin remains normal. Right vertebral artery is patent and mildly tortuous to the skull base with no significant plaque or stenosis. Proximal left subclavian artery plaque is mild to moderate, stable without significant stenosis. Left vertebral artery origin is normal. Fairly codominant left vertebral artery is patent to the skull base with no significant plaque or stenosis identified. CTA HEAD Posterior circulation: Mildly dominant right V4 segment. Distal vertebral arteries and vertebrobasilar junction are patent with no significant plaque or stenosis. Normal left PICA origin. As before right AICA may be dominant. Patent basilar artery, AICA origins, SCA and PCA origins with no significant stenosis. Posterior communicating arteries are diminutive or absent. Bilateral PCA branches  are stable and within normal limits. Anterior circulation: Both ICA siphons are patent. Mild to moderate bilateral siphon calcified plaque. No significant stenosis on the left. On the right side there is a mild to moderate pre cavernous stenosis at the junction of the petrous and cavernous segments on series 13, image 124 which is not significantly changed. Patent carotid termini, MCA and ACA origins. Normal anterior communicating artery. Bilateral ACA branches are stable and within normal limits. Left MCA  M1 segment and trifurcation are patent without stenosis. Left MCA branches appear stable and within normal limits. Right MCA M1 segment is patent to a bifurcation with a small chronic saccular aneurysm at the anterior temporal artery origin best seen series 12, image 35. This lesion is 2-3 mm (series 10, image 89) and chronic although more conspicuous this year probably due to better contrast timing (series 7, image 90 last year). No associated stenosis. Right MCA bifurcation is patent and right MCA branches appear stable and within normal limits. Venous sinuses: Early contrast timing, grossly patent. Anatomic variants: Mildly dominant right vertebral artery. Review of the MIP images confirms the above findings IMPRESSION: 1. Negative for large vessel occlusion.  Negative CT Perfusion. 2. 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 Recommend Neuro Endovascular follow-up to evaluate the appropriateness of potential treatment. 3. But generally mild for age atherosclerosis in the head and neck, stable since last year. There is mild to moderate right ICA siphon pre cavernous stenosis. 4. Aortic Atherosclerosis (ICD10-I70.0) and Emphysema (ICD10-J43.9). Salient findings were communicated to Dr. Quinn Axe at 10:04 am on 01/08/2022 by text page via the [facility] messaging system. Electronically Signed   By: Genevie Ann M.D.   On: 01/08/2022 10:06   DG  Abdomen 1 View Result Date: 01/08/2022 CLINICAL DATA:  71 year old female with possible sepsis. Code stroke presentation. EXAM: ABDOMEN - 1 VIEW COMPARISON:  Portable chest today reported separately. Abdominal radiographs 03/06/2013. FINDINGS: Portable AP supine view at 0906 hours. Patient is rotated to the right. Enteric tube placed into the stomach, side hole at the level of the gastric body. Tip is in the right upper quadrant. Lung bases appear clear. Non obstructed bowel gas pattern. No acute osseous abnormality identified. IMPRESSION: Enteric tube side hole at the gastric body level. Tip may be in the distal stomach or proximal duodenum. Nonobstructed visible bowel gas pattern. Electronically Signed   By: Genevie Ann M.D.   On: 01/08/2022 09:18   DG Chest Port 1 View Result Date: 01/08/2022 CLINICAL DATA:  71 year old female with possible sepsis. Code stroke presentation. EXAM: PORTABLE CHEST 1 VIEW COMPARISON:  Portable chest 12/10/2021 and earlier. FINDINGS: Portable AP semi upright view at 0904 hours. Rotated to the right today. Intubated. Endotracheal tube tip in good position between the clavicles and carina. Enteric tube placed into the stomach, loops in the visible abdomen. Stable left chest AICD. Stable cardiac size and mediastinal contours. Mildly lower lung volumes. But Allowing for portable technique the lungs are clear. Stable visualized osseous structures. IMPRESSION: 1. Satisfactory ET tube and enteric tube placement. 2. No acute cardiopulmonary abnormality. Electronically Signed   By: Genevie Ann M.D.   On: 01/08/2022 09:16   CT HEAD CODE STROKE WO CONTRAST Result Date: 01/08/2022 CLINICAL DATA:  Code stroke.  71 year old female. EXAM: CT HEAD WITHOUT CONTRAST TECHNIQUE: Contiguous axial images were obtained from the base of the skull through the vertex without intravenous contrast. RADIATION DOSE REDUCTION: This exam was performed according to the departmental dose-optimization program which  includes automated exposure control, adjustment of the mA and/or kV according to patient size and/or use of iterative reconstruction technique. COMPARISON:  12/10/2021. FINDINGS: Brain: Intermittent motion artifact. Stable cerebral volume. No midline shift, mass effect, or evidence of intracranial mass lesion. No ventriculomegaly. Chronic cortical encephalomalacia in the left parietal lobe and occipital pole appears stable from last month. Patchy and confluent additional bilateral white matter hypodensity with  some deep white matter capsule involvement. Small area of right middle frontal gyrus cortical encephalomalacia appears stable, chronic series 2, image 22. No acute intracranial hemorrhage identified. No acute cortically based infarct identified. Vascular: No suspicious intracranial vascular hyperdensity. Degraded Skull: Motion artifact at the skull base. No acute osseous abnormality identified. Sinuses/Orbits: Left sphenoid sinus fluid and mucosal thickening persists. Other Visualized paranasal sinuses and mastoids are clear. Other: Some rightward gaze. Otherwise negative orbit and scalp soft tissues. ASPECTS Eliza Coffee [facility] Stroke Program Early CT Score) Total score (0-10 with 10 being normal): 10 (chronic encephalomalacia suspected) IMPRESSION: 1. Motion degraded exam. Scattered bilateral small cortical infarcts appear stable since last month. Bilateral white matter disease. No acute cortically based infarct or acute intracranial hemorrhage identified. ASPECTS 10. 2. These results were communicated to Dr. Quinn Axe at 8:35 am on 01/08/2022 by text page via the [facility] messaging system. Electronically Signed   By: Genevie Ann M.D.   On: 01/08/2022 08:36     Assessment and Plan:   WCT In review of EKGs and telemetry I think this is ST with widening oif her baseline QRS, not VT Though she is is hypokalemic and recommend keeping her K+ supplemented (being addressed already)   2. NICM 3. Chronic CHF (systolic) Does not  appear volume OL by exam or CXR  4. ICD 2 zones VT starts at 200bpm, VF at 240bpm Implanted 2011 NOT MRI conditional   5. Known baseline COPD (described as severe) on home O2 intubated C/w CCM/attending teams   Dr. Myles Gip will see once out of procedure  Risk Assessment/Risk Scores:    For questions or updates, please contact [place] Please consult [email or web address] for contact info under    Signed, Baldwin [place], PA-C  01/08/2022 10:57 AM

## 2022-01-09 DIAGNOSIS — R569 Unspecified convulsions: Secondary | ICD-10-CM | POA: Diagnosis not present

## 2022-01-09 LAB — CBC
HCT: 34 % — ABNORMAL LOW (ref 36.0–46.0)
Hemoglobin: 10.3 g/dL — ABNORMAL LOW (ref 12.0–15.0)
MCH: 26.3 pg (ref 26.0–34.0)
MCHC: 30.3 g/dL (ref 30.0–36.0)
MCV: 87 fL (ref 80.0–100.0)
Platelets: 309 10*3/uL (ref 150–400)
RBC: 3.91 MIL/uL (ref 3.87–5.11)
RDW: 22.1 % — ABNORMAL HIGH (ref 11.5–15.5)
WBC: 8.5 10*3/uL (ref 4.0–10.5)
nRBC: 0 % (ref 0.0–0.2)

## 2022-01-09 LAB — BASIC METABOLIC PANEL
Anion gap: 8 (ref 5–15)
BUN: 6 mg/dL — ABNORMAL LOW (ref 8–23)
CO2: 25 mmol/L (ref 22–32)
Calcium: 7.5 mg/dL — ABNORMAL LOW (ref 8.9–10.3)
Chloride: 108 mmol/L (ref 98–111)
Creatinine, Ser: 0.97 mg/dL (ref 0.44–1.00)
GFR, Estimated: >60 mL/min (ref >60)
Glucose, Bld: 108 mg/dL — ABNORMAL HIGH (ref 70–99)
Potassium: 4.1 mmol/L (ref 3.5–5.1)
Sodium: 141 mmol/L (ref 135–145)

## 2022-01-09 LAB — MAGNESIUM: Magnesium: 2.3 mg/dL (ref 1.7–2.4)

## 2022-01-09 LAB — GLUCOSE, CAPILLARY
Glucose-Capillary: 101 mg/dL — ABNORMAL HIGH (ref 70–99)
Glucose-Capillary: 111 mg/dL — ABNORMAL HIGH (ref 70–99)
Glucose-Capillary: 113 mg/dL — ABNORMAL HIGH (ref 70–99)
Glucose-Capillary: 114 mg/dL — ABNORMAL HIGH (ref 70–99)
Glucose-Capillary: 116 mg/dL — ABNORMAL HIGH (ref 70–99)
Glucose-Capillary: 84 mg/dL (ref 70–99)

## 2022-01-09 LAB — URINE CULTURE: Culture: NO GROWTH

## 2022-01-09 LAB — LACTIC ACID, PLASMA
Lactic Acid, Venous: 1.5 mmol/L (ref 0.5–1.9)
Lactic Acid, Venous: 1.8 mmol/L (ref 0.5–1.9)

## 2022-01-09 LAB — PHOSPHORUS: Phosphorus: 3.4 mg/dL (ref 2.5–4.6)

## 2022-01-09 LAB — CALCIUM, IONIZED: Calcium, Ionized, Serum: 4.2 mg/dL — ABNORMAL LOW (ref 4.5–5.6)

## 2022-01-09 MED ORDER — CLOPIDOGREL BISULFATE 75 MG PO TABS
75.0000 mg | ORAL_TABLET | Freq: Every day | ORAL | Status: DC
  Administered 2022-01-10 – 2022-01-17 (×8): 75 mg via ORAL
  Filled 2022-01-09 (×8): qty 1

## 2022-01-09 MED ORDER — FUROSEMIDE 40 MG PO TABS
20.0000 mg | ORAL_TABLET | ORAL | Status: DC
  Administered 2022-01-09: 20 mg
  Filled 2022-01-09: qty 1

## 2022-01-09 MED ORDER — ORAL CARE MOUTH RINSE: 15.0000 mL | OROMUCOSAL | Status: DC | PRN

## 2022-01-09 MED ORDER — ORAL CARE MOUTH RINSE
15.0000 mL | OROMUCOSAL | Status: DC
  Administered 2022-01-09 – 2022-01-16 (×27): 15 mL via OROMUCOSAL

## 2022-01-09 MED ORDER — ENOXAPARIN SODIUM 40 MG/0.4ML IJ SOSY
40.0000 mg | PREFILLED_SYRINGE | INTRAMUSCULAR | Status: DC
  Administered 2022-01-10 – 2022-01-17 (×8): 40 mg via SUBCUTANEOUS
  Filled 2022-01-09 (×8): qty 0.4

## 2022-01-09 MED ORDER — VITAMIN B-12 1000 MCG PO TABS
1000.0000 ug | ORAL_TABLET | Freq: Every day | ORAL | Status: DC
  Administered 2022-01-10 – 2022-01-17 (×8): 1000 ug via ORAL
  Filled 2022-01-09 (×8): qty 1

## 2022-01-09 MED ORDER — DEXMEDETOMIDINE HCL IN NACL 400 MCG/100ML IV SOLN
0.4000 ug/kg/h | INTRAVENOUS | Status: DC
  Administered 2022-01-09: 0.4 ug/kg/h via INTRAVENOUS
  Filled 2022-01-09 (×2): qty 100

## 2022-01-09 MED ORDER — LEVOTHYROXINE SODIUM 112 MCG PO TABS
112.0000 ug | ORAL_TABLET | Freq: Every day | ORAL | Status: DC
  Administered 2022-01-12 – 2022-01-17 (×5): 112 ug via ORAL
  Filled 2022-01-09 (×8): qty 1

## 2022-01-09 MED ORDER — ATORVASTATIN CALCIUM 80 MG PO TABS
80.0000 mg | ORAL_TABLET | Freq: Every day | ORAL | Status: DC
  Administered 2022-01-10 – 2022-01-17 (×8): 80 mg via ORAL
  Filled 2022-01-09 (×2): qty 1
  Filled 2022-01-09: qty 2
  Filled 2022-01-09: qty 1
  Filled 2022-01-09: qty 2
  Filled 2022-01-09 (×3): qty 1

## 2022-01-09 MED ORDER — LACTATED RINGERS IV SOLN: INTRAVENOUS | Status: AC

## 2022-01-09 MED ORDER — INSULIN ASPART 100 UNIT/ML IJ SOLN: 0.0000 [IU] | INTRAMUSCULAR | Status: DC

## 2022-01-09 MED ORDER — POLYETHYLENE GLYCOL 3350 17 G PO PACK: 17.0000 g | PACK | Freq: Every day | ORAL | Status: DC | PRN

## 2022-01-09 MED ORDER — VITAMIN B-12 1000 MCG PO TABS
1000.0000 ug | ORAL_TABLET | Freq: Every day | ORAL | Status: DC
  Administered 2022-01-09: 1000 ug
  Filled 2022-01-09: qty 1

## 2022-01-09 MED ORDER — PANTOPRAZOLE SODIUM 40 MG PO TBEC
40.0000 mg | DELAYED_RELEASE_TABLET | Freq: Every day | ORAL | Status: DC
  Administered 2022-01-10 – 2022-01-17 (×8): 40 mg via ORAL
  Filled 2022-01-09 (×8): qty 1

## 2022-01-09 MED ORDER — DOCUSATE SODIUM 50 MG/5ML PO LIQD: 100.0000 mg | Freq: Two times a day (BID) | ORAL | Status: DC | PRN

## 2022-01-09 MED ORDER — INSULIN ASPART 100 UNIT/ML IJ SOLN
0.0000 [IU] | INTRAMUSCULAR | Status: DC
  Administered 2022-01-14: 2 [IU] via SUBCUTANEOUS

## 2022-01-09 MED ORDER — CHLORHEXIDINE GLUCONATE CLOTH 2 % EX PADS
6.0000 | MEDICATED_PAD | Freq: Every day | CUTANEOUS | Status: DC
  Administered 2022-01-09 – 2022-01-17 (×9): 6 via TOPICAL

## 2022-01-09 MED ORDER — METOPROLOL TARTRATE 12.5 MG HALF TABLET: 12.5000 mg | ORAL_TABLET | Freq: Two times a day (BID) | ORAL | Status: DC

## 2022-01-09 MED ORDER — METOPROLOL TARTRATE 12.5 MG HALF TABLET
12.5000 mg | ORAL_TABLET | Freq: Two times a day (BID) | ORAL | Status: DC
  Administered 2022-01-09: 12.5 mg
  Filled 2022-01-09: qty 1

## 2022-01-09 NOTE — Progress Notes (Signed)
RT NOTE: holding on SBT this AM due to patient having an elevated HR when stimulated and awake.  (HR increased to 140s this AM).  Patient tolerating full support ventilator settings well at this time.  Will continue to monitor.

## 2022-01-09 NOTE — Progress Notes (Signed)
EPIC Encounter for ICM Monitoring  Patient Name: Stacy Grimes is a 71 y.o. female Date: 01/09/2022 Primary Care Physican: Cyndi Bender, PA-C Primary Cardiologist: Fletcher Anon Electrophysiologist: Vergie Living Pacing:  97%  10/12/2020 Office Weight: 184 lbs   AT/AF Burden 1.1% (taking Aspirin & Plavix)   Spoke to husband per DPR and pt is currently in hospital.   Still unsure of dx but checking if she had a seizure.      Coruve thoracic impedance suggesting normal fluid levels but was suggesting possible fluid accumulation 11/13-11/25.     Prescribed:  Furosemide 20 mg Take 1 tablet (20 mg total) by mouth every other day.  May take an additional tablet AS NEEDED for weight gain of 3 lbs overnight or 5 lbs in one week. Do not exceed more than 3 additional doses.   Potassium 20 mEq take 1 tablet daily    Labs: 01/09/2022 Creatinine 0.97, BUN 6, Potassium 4.1, Sodium 141, GFR >60 01/08/2022 Creatinine 0.85, BUN 8, Potassium 4.2, Sodium 141, GFR >60  12/13/2021 Creatinine 1.07, BUN 20, Potassium 4.4, Sodium 141, GFR 55  12/11/2021 Creatinine 1.38, BUN 20, Potassium 4.5, Sodium 140, GFR 41  A complete set of results can be found in Results Review.   Recommendations:  No changes and encouraged to call if experiencing any fluid symptoms.    Follow-up plan: ICM clinic phone appointment on 01/21/2022 to check fluid levels after hospitalization.  91 day device clinic remote transmission scheduled 04/04/2022.     EP/Cardiology Office Visits:  03/30/2022 (changed from 01/17/2022) with Dr Fletcher Anon.  Missed 01/01/2022 with Dr Caryl Comes.   Last OV with Dr Caryl Comes was 07/16/2017.    Copy of ICM check sent to Dr. Caryl Comes.     3 month ICM trend: 01/07/2022.    12-14 Month ICM trend:     [name], RN 01/09/2022 9:15 AM

## 2022-01-09 NOTE — Progress Notes (Signed)
Neurology Progress Note  Brief HPI: 71 year old female with a history of dementia, COPD, CAD, cardiomyopathy, CHF, sleep apnea, hypothyroidism, hypertension, anxiety, recent right femur fracture with repair and osteopenia who presents after having a seizure with right gaze deviation on 01/08/2022.  She was discharged from the hospital last month after a repair of her right femur which was fractured after a fall.  She has been bedbound lately and at baseline she is alert and oriented to self only.  Her husband found her in bed shaking with right gaze deviation.  Per EMS she had an additional seizure while en route to the hospital and was given a dose of Versed.  She was intubated in the ED for respiratory distress and hypoxia.  She did have an episode of wide-complex tachycardia that resolved after fluids.  Subjective: Remains intubated on LTM.  Opens eyes, but does not follow commands.  She is tracking bilaterally.  Appears restless, on fentanyl for sedation  Exam: Vitals:   01/09/22 0633 01/09/22 0700  BP:  (!) 110/57  Pulse: 90 83  Resp: (!) 22 (!) 22  Temp: 98.1 F (36.7 C) 98.1 F (36.7 C)  SpO2: 95% 96%   Gen: In bed, NAD Resp: mechanically ventilated  Abd: soft, nt.    Neuro: Mental Status: Intubated, sedated with fentanyl Opens eyes to voice, does not follow commands.  She does make eye contact and track the examiner bilaterally Cranial Nerves: II: Pupils 64mm and reactive III,IV, VI: Lateral gaze intact, able to track examiner VIII: Hearing is intact to voice X: cough and gag intact XI: head is midline Motor: Tone is normal. Bulk is normal.  Moves all extremities antigravity Sensory: Localizes to pain in all extremities Gait: steady or deferred for safety    Pertinent Labs: Lactic-greater than 9.0  Imaging Reviewed: cEEG 1318 01/08/22 to 0730 01/09/22 - This was was an abnormal continuous video EEG due to diffuse background slowing and absent waking rhythms,  indicative of a severe encephalopathy pattern. No seizures or epileptiform discharges were seen.    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  Unable to obtain MRI due to PPM incompatibility.  Impression:  Seizure  Recommendations: -Continue Keppra 1000 mg twice daily -As needed ativan -Continue LTM until tmrw, if no seizures overnight can discontinue   Patient seen and examined by NP/APP with MD. MD to update note as needed.   Janine Ores, DNP, FNP-BC [facility] Pager: [phone]    Attending Neurohospitalist Addendum Patient seen and examined with APP/Resident. Agree with the history and physical as documented above. Agree with the plan as documented, which I helped formulate. I have edited the note above to reflect my full findings and recommendations. I have independently reviewed the chart, obtained history, review of systems and examined the patient.I have personally reviewed pertinent head/neck/spine imaging (CT/MRI). Please feel free to call with any questions.  Patient self-extubated this afternoon. Is stable on 4L. Opens eyes to voice, does not follow commands, moves L side spontaneously more than the right. Agree with recs above, will continue to follow.   -- Su Monks, MD [facility] [phone]  If 7pm- 7am, please page neurology on call as listed in [place].

## 2022-01-09 NOTE — Progress Notes (Signed)
RT called d/t pt self extubating.  Found pt on NRB at 100% satting 100% so changed to [place] @ 4LPM with sat of 94%.  RT will cont to monitor.

## 2022-01-09 NOTE — Procedures (Signed)
EEG Procedure CPT/Type of Study: 20233; 24hr EEG with video Referring Provider: Loanne Drilling Primary Neurological Diagnosis: AMS  History: This is a 71 yr old patient, undergoing an EEG to evaluate for AMS. Clinical State: obtunded  Technical Description:  The EEG was performed using standard setting per the guidelines of American Clinical Neurophysiology Society (ACNS).  A minimum of 21 electrodes were placed on scalp according to the International 10-20 or/and 10-10 Systems. Supplemental electrodes were placed as needed. Single EKG electrode was also used to detect cardiac arrhythmia. Patient's behavior was continuously recorded on video simultaneously with EEG. A minimum of 16 channels were used for data display. Each epoch of study was reviewed manually daily and as needed using standard referential and bipolar montages. Computerized quantitative EEG analysis (such as compressed spectral array analysis, trending, automated spike & seizure detection) were used as indicated.   Day 1: from 1318 01/08/22 to 0730 01/09/22  EEG Description: Overall Amplitude:Normal Predominant Frequency: The background activity showed delta/theta slowing occurring frequently. Superimposed Frequencies: sparse beta activity The background was symmetric  Background Abnormalities: Generalized slowing, delta/theta range Rhythmic or periodic pattern: No Epileptiform activity: no Electrographic seizures: no Events: no   Breach rhythm: no  Reactivity: Present but without waking patterns  Stimulation procedures:  Hyperventilation: not done Photic stimulation: no change  Sleep Background: Stage I  EKG:no significant arrhythmia  Impression: This was was an abnormal continuous video EEG due to diffuse background slowing and absent waking rhythms, indicative of a severe encephalopathy pattern. No seizures or epileptiform discharges were seen.

## 2022-01-09 NOTE — Progress Notes (Signed)
Initial Nutrition Assessment  DOCUMENTATION CODES:  Not applicable  INTERVENTION:  If pt to remain intubated, recommend initiating EN via NGT/OGT Initiate Vital 1.5 @ 74mL/hr, as tolerated increase EN by 37mL q 6hrs to goal rate of 47mL/hr x 24hrs (1079mL total volume) 74mL Prosource TF20 q day  Provides 1700kcal, 93g protein, 813mL free water   NUTRITION DIAGNOSIS:  Inadequate oral intake related to other (see comment) (intubation) as evidenced by NPO status.  GOAL:  Patient will meet greater than or equal to 90% of their needs  MONITOR:  Vent status  REASON FOR ASSESSMENT:  Ventilator   ASSESSMENT:  Pt is a 71yo F with PMH of OSA, HTN, pacemaker, COPD, T2DM, CVA, CHF, AAA, hypothyroidism, GERD, arthritis, recent femur fracture and dementia who presents s/p witnessed seizure.  Pt remains intubated and sedated in ICU with continuous EEG. Report from EEG notes, "This was an abnormal continuous video EEG due to diffuse background slowing and absent waking rhythms, indicative of a severe encephalopathy pattern. No seizures or epileptiform discharges were seen." Visited at bedside this afternoon, husband sleeping soundly. NFPE limited but showing showing some mild to moderate muscle depletions. Weight history in EMR shows a significant 7.3% unintended weight loss in the last 1 month. Not enough information at this time to diagnose malnutrition but at very high risk. No NGT/OGT in place at this time. If no seizure activity, recommend initiating EN. Recommend initiating Vital 1.5 at 26mL/hr with Prosource TF20 q day. As tolerated, increase by 42mL q 6hrs to goal rate of 59mL/hr x 24hrs (1067mL total volume). Vital 1.5 at goal rate and 51mL Prosource TF20 q day will provide 1700kcal, 93g protein and 857mL free water.  Weight History: 01/09/22 75.2 kg  12/10/21 81.2 kg  10/30/21 93.3 kg  07/10/21 81.3 kg  10/12/20 83.5 kg    Medications reviewed and include: lipitor, plavix, novolog ssi  q 4hrs, synthroid, protonix, LR @75mL /hr, fentanyl, versed, keppra, precedex  Labs reviewed: BG:84-128, BUN:6, A1c:7.0(from September)   NUTRITION - FOCUSED PHYSICAL EXAM:  Flowsheet Row Most Recent Value  Orbital Region No depletion  Upper Arm Region Mild depletion  Thoracic and Lumbar Region No depletion  Buccal Region Unable to assess  Temple Region Moderate depletion  Clavicle Bone Region Mild depletion  Clavicle and Acromion Bone Region No depletion  Scapular Bone Region Unable to assess  Dorsal Hand No depletion  Patellar Region Unable to assess  Anterior Thigh Region No depletion  Posterior Calf Region Unable to assess  Hair Reviewed  Eyes Reviewed  Mouth Reviewed  Skin Reviewed  Nails Reviewed       Diet Order:   Diet Order             Diet NPO time specified  Diet effective now                   EDUCATION NEEDS:  Not appropriate for education at this time  Skin:  Skin Assessment: Reviewed RN Assessment  Last BM:  unknown, PTA  Height:  Ht Readings from Last 1 Encounters:  01/08/22 5\' 3"  (1.6 m)    Weight:  Wt Readings from Last 1 Encounters:  01/09/22 75.2 kg    BMI:  Body mass index is 29.37 kg/m.  Estimated Nutritional Needs:  Kcal:  1700-1900kcal Protein:  90-115g Fluid:  1700-192mL  Candise Bowens, MS, RD, LDN, CNSC See AMiON for contact information

## 2022-01-09 NOTE — Progress Notes (Signed)
Pt self extubated in restraints. Pt placed on nonrebreather mask than transitioned to 4L [place]. V/S/S. MD called pt's husband unable to reach, pt's son notified.

## 2022-01-09 NOTE — Progress Notes (Signed)
vLTM maintenance  all impedances below 10k.  No skin breakdown noted at [place]  FP2  F7  FZ  F8

## 2022-01-09 NOTE — Progress Notes (Signed)
NAME:  Stacy Grimes, MRN:  [id number], DOB:  1950/03/13, LOS: 1 ADMISSION DATE:  01/08/2022, CONSULTATION DATE:  01/08/22 REFERRING MD:  Doren Custard CHIEF COMPLAINT:  Seizures, AMS   History of Present Illness:  Stacy Grimes is a 71 y.o. female who has a PMH as below. She was last in her usual state of health around 130 AM on 12/5 when she was up and talking. Her husband later heard a noise and shaking "like an earthquake". He woke up and found her snoring with abnormal respirations, right sided gaze deviation, some bleeding from right side of mouth. He called EMS who reported that en route to ED, she had a witnessed seizure event that abated after Versed administration.  In ED, she was taken to CT scanner and while there, she had hypoxia and respiratory distress requiring intubation. CT was negative. She was seen by neurology and loaded with Keppra while LTM was being connected. During CT, she had an episode of wide complex tachycardia that responded spontaneously with fluids. She was found to be hypokalemic and hypocalcemic and these were repleted.  PCCM called for admission.  No recent illness. Had recent admission 12/10/21 through 12/13/21 after fall where she was found to have nondisplaced fx of right supracondylar femur with severe DJD of the right knee following ORIF of right hip fx 10/23/21 She was seen by ortho who recommended conservative measures and NWB status. Besides that, nothing out of the ordinary over the last several days to weeks per her husband.  Pertinent  Medical History:  has SLEEP APNEA, OBSTRUCTIVE; HYPERTENSION, BENIGN; Dilated cardiomyopathy ([place]); PALPITATIONS; MURMUR; COUGH; 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; Personal history of colonic polyps; Dysphagia, unspecified(787.20); Unspecified constipation; Fatigue; Benign neoplasm of colon; Diverticulosis of colon (without mention of hemorrhage); Syncope; Dehydration; Elevated troponin I level; Left  knee pain; Chronic obstructive pulmonary disease, unspecified COPD type ([place]); Type 2 diabetes mellitus with hyperglycemia, unspecified whether long term insulin use ([place]); Acute ischemic stroke ([place]); Chronic congestive heart failure ([place]); Orthostatic hypotension; Hip fracture, unspecified laterality, closed, initial encounter ([place]); Hip fracture ([place]); CHF (congestive heart failure) ([place]); Shock circulatory ([place]); Femur fracture ([place]); Fall; Dementia ([place]); COVID-19 virus infection; Seizures ([place]); Acute on chronic respiratory failure with hypoxia and hypercapnia ([place]); Hypokalemia; Wide-complex tachycardia; and Status epilepticus ([place]) on their problem list.  Significant Hospital Events: Including procedures, antibiotic start and stop dates in addition to other pertinent events   12/5 - witnessed seizure-like activity s/p versed and keppra. On full ventilatory support. Wide complex tachycardia, thought to ST per EP. Admitted to ICU.   Interim History / Subjective:   Afebrile overnight with MAPs remaining in 70s-90s without need for pressor support. She remains on full ventilatory support. She does awaken to verbal stimuli and becomes significantly agitated, likely from underlying dementia. Her ICD is not MRI compatible.  Objective:  Blood pressure (!) 110/57, pulse 83, temperature 98.1 F (36.7 C), resp. rate (!) 22, height 5\' 3"  (1.6 m), weight 75.2 kg, SpO2 96 %.    Vent Mode: PRVC FiO2 (%):  [40 %-100 %] 40 % Set Rate:  [18 bmp-22 bmp] 22 bmp Vt Set:  [410 mL] 410 mL PEEP:  [5 cmH20] 5 cmH20 Plateau Pressure:  [14 cmH20-16 cmH20] 15 cmH20   Intake/Output Summary (Last 24 hours) at 01/09/2022 0833 Last data filed at 01/09/2022 0600 Gross per 24 hour  Intake 4574.96 ml  Output 1750 ml  Net 2824.96 ml   Filed Weights   01/08/22 0800 01/09/22 0500  Weight: 77.6 kg 75.2 kg    Examination: General: elderly female, laying in bed intermittently agitated when aroused, NAD. HEENT:  [place]/AT, no scleral icterus, ETT in place, cEEG leads in place.  CV: tachycardic rate and regular rhythm, no murmurs.  Pulm: CTABL, no respiratory distress while on full ventilatory support. Abdomen: soft, nondistended, nontender, normoactive bowel sounds. MSK: on peripheral edema noted. Skin: warm and dry. Neuro: comfortably sedated at rest but becomes agitated when aroused with verbal stimuli. Moves all extremities. Does not follow commands.  Labs/imaging personally reviewed:  CT ANGIO HEAD NECK W WO CM W PERF (CODE STROKE) Result Date: 01/08/2022 Upper chest: Intubated. Enteric tube courses into the esophagus. Endotracheal tube tip is at the carina. Chronic left chest AICD. Centrilobular emphysema. Mild superimposed dependent atelectasis mostly in the right lung. Small volume retained secretions near the tip of the ET tube. IMPRESSION: 1. Negative for large vessel occlusion.  Negative CT Perfusion. 2. 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 Recommend Neuro Endovascular follow-up to evaluate the appropriateness of potential treatment. 3. But generally mild for age atherosclerosis in the head and neck, stable since last year. There is mild to moderate right ICA siphon pre cavernous stenosis. 4. Aortic Atherosclerosis (ICD10-I70.0) and Emphysema (ICD10-J43.9). Salient findings were communicated to Dr. Quinn Axe at 10:04 am on 01/08/2022 by text page via the [facility] messaging system. Electronically Signed   By: Genevie Ann M.D.   On: 01/08/2022 10:06    Assessment & Plan:   Seizures with postictal state Hx of CVA Hx of Dementia Seizures resolved with administration of versed and keppra load. CT head without acute abnormalities. CTA head and neck with no LVO. CTP with no core and no penumbra. Neurology is following, plan for MRI brain but patient's AICD is not MRI compatible per EP. -appreciate neurology assistance -continue IV  keppra 1g q12h -continue cEEG  Acute on chronic hypoxic respiratory failure Need for MV d/t inability to protect airway Hx of COPD on 3-4L O2 at home -full ventilatory support -precedex to allow for fentanyl washout, hopefully will allow for earlier extubation -wean as able -pulmonary hygiene -bronchodilators  Wide Complex Tachycardia - Sinus Tach HFrEF (EF 20-25%) 2/2 NICM s/p ICD Hx of AAA HTN -appreciate EP assistance -replete electrolytes as needed, goal K >4 and Mg >2 -continue home lipitor, plavix -resume home lasix 20mg  q48h  -start metoprolol tartrate 12.5mg  BID per tube (in place of home toprol-xl) -holding digoxin and losartan  Hx DM. - SSI - holding home metformin  Hx hypothyroidism. -continue home synthroid  Hx small infarcts, depression, dementia. - holding home donepezil, gabapentin, hydroxyzine, memantine, robaxin, venlafaxine   Best practice (evaluated daily):  Diet/type: NPO DVT prophylaxis: lovenox GI prophylaxis: PPI Lines: N/A Foley:  yes, and it is still needed Code Status:  full code Last date of multidisciplinary goals of care discussion: updated son and spouse at bedside  Labs   CBC: Recent Labs  Lab 01/08/22 0822 01/08/22 0826 01/08/22 1006 01/09/22 0347  WBC 8.1  --   --  8.5  NEUTROABS 6.7  --   --   --   HGB 10.4* 11.9* 11.6* 10.3*  HCT 34.4* 35.0* 34.0* 34.0*  MCV 88.9  --   --  87.0  PLT 267  --   --  712    Basic Metabolic Panel: Recent Labs  Lab 01/08/22 0822 01/08/22 0826 01/08/22 1006 01/08/22 1338 01/08/22 1639  01/09/22 0347  NA 143 145 143  --  141 141  K 3.2* 3.3* 2.8*  --  4.2 4.1  CL 105 105  --   --  110 108  CO2 18*  --   --   --  20* 25  GLUCOSE 206* 202*  --   --  133* 108*  BUN 8 8  --   --  8 6*  CREATININE 1.11* 1.00  --   --  0.85 0.97  CALCIUM 7.0*  --   --   --  7.0* 7.5*  MG  --   --   --  1.0*  --  2.3  PHOS  --   --   --   --   --  3.4   GFR: Estimated Creatinine Clearance: 50.9 mL/min  (by C-G formula based on SCr of 0.97 mg/dL). Recent Labs  Lab 01/08/22 0822 01/08/22 1150 01/09/22 0347  WBC 8.1  --  8.5  LATICACIDVEN  --  >9.0*  --    ABG    Component Value Date/Time   PHART 7.246 (L) 01/08/2022 1006   PCO2ART 60.3 (H) 01/08/2022 1006   PO2ART 485 (H) 01/08/2022 1006   HCO3 26.2 01/08/2022 1006   TCO2 28 01/08/2022 1006   ACIDBASEDEF 2.0 01/08/2022 1006   O2SAT 100 01/08/2022 1006     CBG: Recent Labs  Lab 01/08/22 1518 01/08/22 1916 01/08/22 2320 01/09/22 0315 01/09/22 0732  GLUCAP 143* 115* 128* 114* 84

## 2022-01-10 DIAGNOSIS — R569 Unspecified convulsions: Secondary | ICD-10-CM | POA: Diagnosis not present

## 2022-01-10 LAB — CBC
HCT: 30 % — ABNORMAL LOW (ref 36.0–46.0)
Hemoglobin: 8.8 g/dL — ABNORMAL LOW (ref 12.0–15.0)
MCH: 26.3 pg (ref 26.0–34.0)
MCHC: 29.3 g/dL — ABNORMAL LOW (ref 30.0–36.0)
MCV: 89.6 fL (ref 80.0–100.0)
Platelets: 239 10*3/uL (ref 150–400)
RBC: 3.35 MIL/uL — ABNORMAL LOW (ref 3.87–5.11)
RDW: 21.8 % — ABNORMAL HIGH (ref 11.5–15.5)
WBC: 7.2 10*3/uL (ref 4.0–10.5)
nRBC: 0 % (ref 0.0–0.2)

## 2022-01-10 LAB — GLUCOSE, CAPILLARY
Glucose-Capillary: 103 mg/dL — ABNORMAL HIGH (ref 70–99)
Glucose-Capillary: 91 mg/dL (ref 70–99)
Glucose-Capillary: 114 mg/dL — ABNORMAL HIGH (ref 70–99)
Glucose-Capillary: 109 mg/dL — ABNORMAL HIGH (ref 70–99)
Glucose-Capillary: 97 mg/dL (ref 70–99)

## 2022-01-10 LAB — BASIC METABOLIC PANEL
Anion gap: 7 (ref 5–15)
BUN: 7 mg/dL — ABNORMAL LOW (ref 8–23)
CO2: 28 mmol/L (ref 22–32)
Calcium: 7.8 mg/dL — ABNORMAL LOW (ref 8.9–10.3)
Chloride: 109 mmol/L (ref 98–111)
Creatinine, Ser: 0.91 mg/dL (ref 0.44–1.00)
GFR, Estimated: >60 mL/min (ref >60)
Glucose, Bld: 116 mg/dL — ABNORMAL HIGH (ref 70–99)
Potassium: 3.7 mmol/L (ref 3.5–5.1)
Sodium: 144 mmol/L (ref 135–145)

## 2022-01-10 MED ORDER — LEVETIRACETAM 500 MG PO TABS
1000.0000 mg | ORAL_TABLET | Freq: Two times a day (BID) | ORAL | Status: DC
  Administered 2022-01-11 – 2022-01-17 (×13): 1000 mg via ORAL
  Filled 2022-01-10 (×14): qty 2

## 2022-01-10 MED ORDER — LEVETIRACETAM IN NACL 1000 MG/100ML IV SOLN
1000.0000 mg | Freq: Once | INTRAVENOUS | Status: AC
  Administered 2022-01-10: 1000 mg via INTRAVENOUS
  Filled 2022-01-10: qty 100

## 2022-01-10 MED ORDER — LEVETIRACETAM 500 MG PO TABS: 1000.0000 mg | ORAL_TABLET | Freq: Two times a day (BID) | ORAL | Status: DC

## 2022-01-10 MED ORDER — MEMANTINE HCL 10 MG PO TABS
10.0000 mg | ORAL_TABLET | Freq: Two times a day (BID) | ORAL | Status: DC
  Administered 2022-01-10 – 2022-01-17 (×14): 10 mg via ORAL
  Filled 2022-01-10 (×17): qty 1

## 2022-01-10 MED ORDER — DONEPEZIL HCL 10 MG PO TABS
10.0000 mg | ORAL_TABLET | Freq: Every day | ORAL | Status: DC
  Administered 2022-01-11 – 2022-01-16 (×6): 10 mg via ORAL
  Filled 2022-01-10 (×8): qty 1

## 2022-01-10 MED ORDER — METOPROLOL SUCCINATE ER 50 MG PO TB24: 25.0000 mg | ORAL_TABLET | Freq: Every day | ORAL | Status: DC

## 2022-01-10 MED ORDER — POTASSIUM CHLORIDE 10 MEQ/100ML IV SOLN
10.0000 meq | INTRAVENOUS | Status: AC
  Filled 2022-01-10 (×3): qty 100

## 2022-01-10 MED ORDER — LORAZEPAM 2 MG/ML IJ SOLN: 2.0000 mg | INTRAMUSCULAR | Status: DC | PRN

## 2022-01-10 MED ORDER — LORAZEPAM 2 MG/ML IJ SOLN
2.0000 mg | Freq: Once | INTRAMUSCULAR | Status: AC
  Administered 2022-01-10: 2 mg via INTRAVENOUS
  Filled 2022-01-10: qty 1

## 2022-01-10 MED ORDER — DIGOXIN 125 MCG PO TABS
0.1250 mg | ORAL_TABLET | ORAL | Status: DC
  Administered 2022-01-11 – 2022-01-16 (×3): 0.125 mg via ORAL
  Filled 2022-01-10 (×6): qty 1

## 2022-01-10 MED ORDER — POTASSIUM CHLORIDE CRYS ER 10 MEQ PO TBCR
30.0000 meq | EXTENDED_RELEASE_TABLET | Freq: Once | ORAL | Status: AC
  Administered 2022-01-10: 30 meq via ORAL
  Filled 2022-01-10: qty 3

## 2022-01-10 MED ORDER — GABAPENTIN 300 MG PO CAPS
300.0000 mg | ORAL_CAPSULE | Freq: Two times a day (BID) | ORAL | Status: DC
  Administered 2022-01-10 – 2022-01-15 (×10): 300 mg via ORAL
  Filled 2022-01-10 (×11): qty 1

## 2022-01-10 MED ORDER — VALPROATE SODIUM 100 MG/ML IV SOLN
250.0000 mg | Freq: Two times a day (BID) | INTRAVENOUS | Status: DC
  Administered 2022-01-10 – 2022-01-11 (×3): 250 mg via INTRAVENOUS
  Filled 2022-01-10 (×5): qty 2.5

## 2022-01-10 MED ORDER — LACTATED RINGERS IV BOLUS
1000.0000 mL | Freq: Once | INTRAVENOUS | Status: AC
  Administered 2022-01-10: 1000 mL via INTRAVENOUS

## 2022-01-10 MED ORDER — DEXMEDETOMIDINE HCL IN NACL 400 MCG/100ML IV SOLN
0.2000 ug/kg/h | INTRAVENOUS | Status: DC
  Administered 2022-01-10: 0.2 ug/kg/h via INTRAVENOUS
  Filled 2022-01-10: qty 100

## 2022-01-10 MED ORDER — FUROSEMIDE 40 MG PO TABS: 20.0000 mg | ORAL_TABLET | ORAL | Status: DC

## 2022-01-10 MED ORDER — CLONAZEPAM 0.5 MG PO TBDP
0.5000 mg | ORAL_TABLET | Freq: Two times a day (BID) | ORAL | Status: DC
  Administered 2022-01-10 – 2022-01-15 (×10): 0.5 mg via ORAL
  Filled 2022-01-10 (×11): qty 1

## 2022-01-10 MED ORDER — MIRTAZAPINE 15 MG PO TABS
7.5000 mg | ORAL_TABLET | Freq: Every day | ORAL | Status: DC
  Administered 2022-01-11 – 2022-01-16 (×6): 7.5 mg via ORAL
  Filled 2022-01-10 (×7): qty 1

## 2022-01-10 MED ORDER — LEVETIRACETAM IN NACL 1000 MG/100ML IV SOLN: 1000.0000 mg | Freq: Two times a day (BID) | INTRAVENOUS | Status: DC | PRN

## 2022-01-10 MED ORDER — VENLAFAXINE HCL ER 75 MG PO CP24
75.0000 mg | ORAL_CAPSULE | Freq: Two times a day (BID) | ORAL | Status: DC
  Administered 2022-01-10 – 2022-01-15 (×10): 75 mg via ORAL
  Filled 2022-01-10 (×12): qty 1

## 2022-01-10 NOTE — Procedures (Signed)
EEG Procedure CPT/Type of Study: 70962; 24hr EEG with video Referring Provider: Loanne Drilling Primary Neurological Diagnosis: AMS   History: This is a 71 yr old patient, undergoing an EEG to evaluate for AMS. Clinical State: obtunded   Technical Description:   The EEG was performed using standard setting per the guidelines of American Clinical Neurophysiology Society (ACNS).   A minimum of 21 electrodes were placed on scalp according to the International 10-20 or/and 10-10 Systems. Supplemental electrodes were placed as needed. Single EKG electrode was also used to detect cardiac arrhythmia. Patient's behavior was continuously recorded on video simultaneously with EEG. A minimum of 16 channels were used for data display. Each epoch of study was reviewed manually daily and as needed using standard referential and bipolar montages. Computerized quantitative EEG analysis (such as compressed spectral array analysis, trending, automated spike & seizure detection) were used as indicated.    Day 2: from 0730 01/09/22 to 0730 01/10/22   EEG Description: Overall Amplitude:Normal Predominant Frequency: The background activity showed delta/theta slowing occurring frequently. Superimposed Frequencies: sparse beta activity The background was symmetric   Background Abnormalities: Generalized slowing, delta/theta range, triphasic morphology at times Rhythmic or periodic pattern: No Epileptiform activity: no Electrographic seizures: no Events: no    Breach rhythm: no   Reactivity: Present  Stimulation procedures:  Hyperventilation: not done Photic stimulation: no change   Sleep Background: Stage I   EKG:no significant arrhythmia   Impression: This was was an abnormal continuous video EEG due to diffuse background slowing with some triphasic morphology, indicative of a severe toxic/metabolic encephalopathy pattern. No seizures or epileptiform discharges were seen.

## 2022-01-10 NOTE — Progress Notes (Addendum)
NAME:  Stacy Grimes, MRN:  [id number], DOB:  [birth year], LOS: 2 ADMISSION DATE:  01/08/2022, CONSULTATION DATE:  01/08/22 REFERRING MD:  Doren Custard CHIEF COMPLAINT:  Seizures, AMS   History of Present Illness:  Stacy Grimes is a 71 y.o. female who has a PMH as below. She was last in her usual state of health around 130 AM on 12/5 when she was up and talking. Her husband later heard a noise and shaking "like an earthquake". He woke up and found her snoring with abnormal respirations, right sided gaze deviation, some bleeding from right side of mouth. He called EMS who reported that en route to ED, she had a witnessed seizure event that abated after Versed administration.  In ED, she was taken to CT scanner and while there, she had hypoxia and respiratory distress requiring intubation. CT was negative. She was seen by neurology and loaded with Keppra while LTM was being connected. During CT, she had an episode of wide complex tachycardia that responded spontaneously with fluids. She was found to be hypokalemic and hypocalcemic and these were repleted.  PCCM called for admission.  No recent illness. Had recent admission 12/10/21 through 12/13/21 after fall where she was found to have nondisplaced fx of right supracondylar femur with severe DJD of the right knee following ORIF of right hip fx 10/23/21 She was seen by ortho who recommended conservative measures and NWB status. Besides that, nothing out of the ordinary over the last several days to weeks per her husband.  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; Personal history of colonic polyps; Dysphagia, unspecified(787.20); Unspecified constipation; Fatigue; Benign neoplasm of colon; Diverticulosis of colon (without mention of hemorrhage); Syncope; Dehydration; Elevated troponin I level; Left  knee pain; Chronic obstructive pulmonary disease, unspecified COPD type ([place]); Type 2 diabetes mellitus with hyperglycemia, unspecified whether long term insulin use ([place]); Acute ischemic stroke ([place]); Chronic congestive heart failure ([place]); Orthostatic hypotension; Hip fracture, unspecified laterality, closed, initial encounter ([place]); Hip fracture ([place]); CHF (congestive heart failure) ([place]); Shock circulatory ([place]); Femur fracture ([place]); Fall; Dementia ([place]); COVID-19 virus infection; Seizures ([place]); Acute on chronic respiratory failure with hypoxia and hypercapnia ([place]); Hypokalemia; Wide-complex tachycardia; and Status epilepticus ([place]) on their problem list.  Significant Hospital Events: Including procedures, antibiotic start and stop dates in addition to other pertinent events   12/5 - witnessed seizure-like activity s/p versed and keppra. On full ventilatory support. Wide complex tachycardia, thought to ST per EP. Admitted to ICU.   Interim History / Subjective:   Afebrile overnight. Became hypotensive with precedex gtt overnight, turned off this AM. She self-extubated yesterday, but now saturating >92% on home 4L O2 via [place]. She had a drop in her hgb count but no overt bleeding noted to suspect acute blood loss.   She is alert and awake, but is significantly agitated, attempting to get OOB and flailing arms at nursing staff. She also pulled out her IV access. She has a history of dementia and thus would like to avoid IM benzodiazepines. Unfortunately, her Qtc is prolonged, unable to give geodon or haldol. IV access was able to be established, will give single dose of IV ativan and subsequently start low dose precedex to help with agitation.  She is stable, no longer having seizure activity. Once she became less agitated, she passed a bedside swallow, tolerated oral medicines, and ate a container of applesauce without difficulty.  Objective:  Blood pressure 107/63, pulse 64, temperature 98.1 F  (36.7 C), temperature source Oral, resp. rate 11, height 5\' 3"  (1.6 m), weight 74.8 kg, SpO2 98 %.        Intake/Output Summary (Last 24 hours) at 01/10/2022 1209 Last data filed at 01/10/2022 0600 Gross per 24 hour  Intake 1246.21 ml  Output 380 ml  Net 866.21 ml   Filed Weights   01/08/22 0800 01/09/22 0500 01/10/22 0500  Weight: 77.6 kg 75.2 kg 74.8 kg    Examination: General: chronically ill-appearing elderly female, attempting to get out of bed and flailing arms. HEENT: [place]/AT, no scleral icterus, cEEG leads in place. CV: normal rate and regular rhythm, no murmurs. Pulm: CTABL, saturating well on 4L [place]. No wheezing, rhonchi, rales. Abdomen: soft, NT, ND, normoactive bowel sounds. MSK: no peripheral edema noted. Skin: warm and dry. Neuro: agitated, attempting to get out of bed, flailing arms. Moving all extremities. Not following commands.    Assessment & Plan:   Seizures Hx of CVA Hx of Dementia Patient has a history of CVA and has severe dementia, causing significant agitation especially in the ICU setting. Family has noted that she is "feisty" at home as well, so appears to be close to baseline. Fortunately, no seizure activity noted on cEEG. She did settle down with a dose of ativan. Currently back on precedex drip to prevent injury to self or to staff. Will start oral clonazepam (low dose) to help with agitation and hopefully wean off of precedex.  -appreciate neurology assistance -changed keppra to 1000mg  BID orally (IV formulation kept as PRN if unable to give oral dose) -start valproate -ativan prn  -discontinue LTM -AICD incompatible with MRI -restart home donepezil, memantine -passed bedside swallow, started DYS 3 diet  Acute on chronic hypoxic respiratory failure -improved Need for MV d/t inability to protect airway - resolved Hx of COPD on 3-4L O2 at home Patient self-extubated yesterday. She is doing well from a pulmonary standpoint, back on her home 4L O2  via [place] and saturating >92%.  -pulmonary hygiene -bronchodilators  Wide Complex Tachycardia - Sinus Tach HFrEF (EF 20-25%) 2/2 NICM s/p ICD Hx of AAA HTN -appreciate EP assistance -replete electrolytes as needed, goal K >4 and Mg >2 -continue home lipitor, plavix -restart home digoxin -holding home lasix, toprol-xl, losartan given low pressures with precedex, restart once able  Hx DM - SSI - holding home metformin  Hx hypothyroidism -continue home synthroid  Hx depression - restarted home venlafaxine, mirtazipine, gabapentin   Best practice (evaluated daily):  Diet/type: NPO DVT prophylaxis: lovenox GI prophylaxis: PPI Lines: N/A Foley:  yes, and it is still needed Code Status:  full code Last date of multidisciplinary goals of care discussion: updated son and spouse at bedside  Labs   CBC: Recent Labs  Lab 01/08/22 0822 01/08/22 0826 01/08/22 1006 01/09/22 0347 01/10/22 0240  WBC 8.1  --   --  8.5 7.2  NEUTROABS 6.7  --   --   --   --   HGB 10.4* 11.9* 11.6* 10.3* 8.8*  HCT 34.4* 35.0* 34.0* 34.0* 30.0*  MCV 88.9  --   --  87.0 89.6  PLT 267  --   --  309 814    Basic Metabolic Panel: Recent Labs  Lab 01/08/22 0822 01/08/22 0826 01/08/22 1006 01/08/22 1338 01/08/22 1639 01/09/22 0347 01/10/22 0240  NA 143 145 143  --  141 141 144  K 3.2* 3.3* 2.8*  --  4.2 4.1 3.7  CL 105 105  --   --  110 108 109  CO2 18*  --   --   --  20* 25 28  GLUCOSE 206* 202*  --   --  133* 108* 116*  BUN 8 8  --   --  8 6* 7*  CREATININE 1.11* 1.00  --   --  0.85 0.97 0.91  CALCIUM 7.0*  --   --   --  7.0* 7.5* 7.8*  MG  --   --   --  1.0*  --  2.3  --   PHOS  --   --   --   --   --  3.4  --    GFR: Estimated Creatinine Clearance: 54.2 mL/min (by C-G formula based on SCr of 0.91 mg/dL). Recent Labs  Lab 01/08/22 0822 01/08/22 1150 01/09/22 0347 01/09/22 0839 01/09/22 1033 01/10/22 0240  WBC 8.1  --  8.5  --   --  7.2  LATICACIDVEN  --  >9.0*  --  1.5 1.8  --     ABG    Component Value Date/Time   PHART 7.246 (L) 01/08/2022 1006   PCO2ART 60.3 (H) 01/08/2022 1006   PO2ART 485 (H) 01/08/2022 1006   HCO3 26.2 01/08/2022 1006   TCO2 28 01/08/2022 1006   ACIDBASEDEF 2.0 01/08/2022 1006   O2SAT 100 01/08/2022 1006     CBG: Recent Labs  Lab 01/09/22 1928 01/09/22 2347 01/10/22 0317 01/10/22 0728 01/10/22 1128  GLUCAP 113* 116* 103* 91 114*

## 2022-01-10 NOTE — Progress Notes (Signed)
eLink Physician-Brief Progress Note Patient Name: Stacy Grimes DOB: 06/24/1950 MRN: [id number]   Date of Service  01/10/2022  HPI/Events of Note  Patient refuses to take her PO medication. She is admitted for seizures and is on Keppra and Gabapentin PO. Gabapentin has no IV formulation.   eICU Interventions  Plan: Keppra 1000 mg IV X 1.      Intervention Category Major Interventions: Other:;Seizures - evaluation and management  Meeah Totino Cornelia Copa 01/10/2022, 10:59 PM

## 2022-01-10 NOTE — Progress Notes (Signed)
LTM maint complete - no skin breakdown under Fp1 Fp2  Serviced [facility] monitored, Event button test confirmed by [facility].

## 2022-01-10 NOTE — Progress Notes (Signed)
LTM EEG discontinued - no skin breakdown at unhook.   

## 2022-01-10 NOTE — Progress Notes (Signed)
Neurology Progress Note  Brief HPI: 71 year old female with a history of dementia, COPD, CAD, cardiomyopathy, CHF, sleep apnea, hypothyroidism, hypertension, anxiety, recent right femur fracture with repair and osteopenia who presents after having a seizure with right gaze deviation on 01/08/2022.  She was discharged from the hospital last month after a repair of her right femur which was fractured after a fall.  She has been bedbound lately and at baseline she is alert and oriented to self only.  Her husband found her in bed shaking with right gaze deviation.  Per EMS she had an additional seizure while en route to the hospital and was given a dose of Versed.  She was intubated in the ED for respiratory distress and hypoxia.  She did have an episode of wide-complex tachycardia that resolved after fluids. Extubated 01/09/2022.   Subjective: Extubated yesterday afternoon. Awake with eyes open and talking today.    Exam: Vitals:   01/10/22 0750 01/10/22 0752  BP:    Pulse:    Resp:    Temp:    SpO2: 98% 98%   Gen: In bed, NAD Resp: 4L [place] Abd: soft, nt.    Neuro: Mental Status: Awake, eyes open.  Cranial Nerves: II: Pupils 38mm and reactive III,IV, VI: Lateral gaze intact, able to track examiner VIII: Hearing is intact to voice X: cough and gag intact XI: head is midline Motor: Tone is normal. Bulk is normal.  Moves all extremities antigravity Sensory: Localizes to pain in all extremities Gait: steady or deferred for safety    Pertinent Labs: Lactic-greater than 9.0 -> 1.8  Imaging Reviewed: cEEG 1318 01/08/22 to 0730 01/09/22 - This was was an abnormal continuous video EEG due to diffuse background slowing and absent waking rhythms, indicative of a severe encephalopathy pattern. No seizures or epileptiform discharges were seen.    cEEG 0730 01/09/22 to 0730 01/10/22- This was was an abnormal continuous video EEG due to diffuse background slowing with some triphasic morphology,  indicative of a severe toxic/metabolic encephalopathy pattern. No seizures or epileptiform discharges were seen.   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  Unable to obtain MRI due to PPM incompatibility. Discontinue LTM today. Continue keppra.   Impression:  Seizure  Recommendations: -Continue keppra 1000mg  bid -As needed ativan -Discontinue LTM - Patient is back to mental status baseline per husband at bedside. Neurology will sign off. - Amb ref neuro at discharge   Patient seen and examined by NP/APP with MD. MD to update note as needed.   Janine Ores, DNP, FNP-BC [facility] Pager: [phone]   Attending Neurohospitalist Addendum Patient seen and examined with APP/Resident. Agree with the history and physical as documented above. Agree with the plan as documented, which I helped formulate. I have edited the note above to reflect my full findings and recommendations. I have independently reviewed the chart, obtained history, review of systems and examined the patient.I have personally reviewed pertinent head/neck/spine imaging (CT/MRI). Please feel free to call with any questions.  -- Su Monks, MD [facility] [phone]  If 7pm- 7am, please page neurology on call as listed in  [facility].

## 2022-01-11 DIAGNOSIS — J9622 Acute and chronic respiratory failure with hypercapnia: Secondary | ICD-10-CM | POA: Diagnosis not present

## 2022-01-11 DIAGNOSIS — E43 Unspecified severe protein-calorie malnutrition: Secondary | ICD-10-CM | POA: Insufficient documentation

## 2022-01-11 DIAGNOSIS — J9621 Acute and chronic respiratory failure with hypoxia: Secondary | ICD-10-CM | POA: Diagnosis not present

## 2022-01-11 DIAGNOSIS — E440 Moderate protein-calorie malnutrition: Secondary | ICD-10-CM | POA: Insufficient documentation

## 2022-01-11 DIAGNOSIS — E46 Unspecified protein-calorie malnutrition: Secondary | ICD-10-CM | POA: Insufficient documentation

## 2022-01-11 LAB — BLOOD GAS, VENOUS
Acid-Base Excess: 4 mmol/L — ABNORMAL HIGH (ref 0.0–2.0)
Bicarbonate: 29.7 mmol/L — ABNORMAL HIGH (ref 20.0–28.0)
Drawn by: 6056
O2 Saturation: 86 %
Patient temperature: 37.1
pCO2, Ven: 48 mmHg (ref 44–60)
pH, Ven: 7.4 (ref 7.25–7.43)
pO2, Ven: 53 mmHg — ABNORMAL HIGH (ref 32–45)

## 2022-01-11 LAB — BASIC METABOLIC PANEL
Anion gap: 9 (ref 5–15)
BUN: 6 mg/dL — ABNORMAL LOW (ref 8–23)
CO2: 27 mmol/L (ref 22–32)
Calcium: 7.9 mg/dL — ABNORMAL LOW (ref 8.9–10.3)
Chloride: 106 mmol/L (ref 98–111)
Creatinine, Ser: 0.85 mg/dL (ref 0.44–1.00)
GFR, Estimated: >60 mL/min (ref >60)
Glucose, Bld: 88 mg/dL (ref 70–99)
Potassium: 4 mmol/L (ref 3.5–5.1)
Sodium: 142 mmol/L (ref 135–145)

## 2022-01-11 LAB — GLUCOSE, CAPILLARY
Glucose-Capillary: 101 mg/dL — ABNORMAL HIGH (ref 70–99)
Glucose-Capillary: 104 mg/dL — ABNORMAL HIGH (ref 70–99)
Glucose-Capillary: 83 mg/dL (ref 70–99)
Glucose-Capillary: 86 mg/dL (ref 70–99)
Glucose-Capillary: 87 mg/dL (ref 70–99)
Glucose-Capillary: 95 mg/dL (ref 70–99)

## 2022-01-11 LAB — CBC
HCT: 29.2 % — ABNORMAL LOW (ref 36.0–46.0)
Hemoglobin: 8.9 g/dL — ABNORMAL LOW (ref 12.0–15.0)
MCH: 26.7 pg (ref 26.0–34.0)
MCHC: 30.5 g/dL (ref 30.0–36.0)
MCV: 87.7 fL (ref 80.0–100.0)
Platelets: 250 10*3/uL (ref 150–400)
RBC: 3.33 MIL/uL — ABNORMAL LOW (ref 3.87–5.11)
RDW: 22.1 % — ABNORMAL HIGH (ref 11.5–15.5)
WBC: 7.7 10*3/uL (ref 4.0–10.5)
nRBC: 0 % (ref 0.0–0.2)

## 2022-01-11 MED ORDER — METOPROLOL SUCCINATE ER 25 MG PO TB24
25.0000 mg | ORAL_TABLET | Freq: Every day | ORAL | Status: DC
  Administered 2022-01-11 – 2022-01-17 (×7): 25 mg via ORAL
  Filled 2022-01-11 (×7): qty 1

## 2022-01-11 MED ORDER — ADULT MULTIVITAMIN W/MINERALS CH
1.0000 | ORAL_TABLET | Freq: Every day | ORAL | Status: DC
  Administered 2022-01-12 – 2022-01-17 (×6): 1 via ORAL
  Filled 2022-01-11 (×7): qty 1

## 2022-01-11 NOTE — Progress Notes (Signed)
Nutrition Follow-up  DOCUMENTATION CODES:  Non-severe (moderate) malnutrition in context of chronic illness  INTERVENTION:  Continue current diet as ordered Nursing to assist with feeding Automatic trays MVI with minerals daily Magic cup TID with meals, each supplement provides 290 kcal and 9 grams of protein If poor PO continues, recommend discussing GOC with family to determine if aggressive nutrition interventions are desired (cortrak, PEG, etc)  NUTRITION DIAGNOSIS:  Moderate Malnutrition (in the context of chronic illness) related to poor appetite as evidenced by mild fat depletion, moderate muscle depletion. - new dx established 12/8  GOAL:  Patient will meet greater than or equal to 90% of their needs - not progressing, poor PO, adding nutrition supplements  MONITOR:  PO intake, Supplement acceptance, Labs  REASON FOR ASSESSMENT:  Ventilator    ASSESSMENT:  Pt is a 71yo F with PMH of OSA, HTN, pacemaker, COPD, T2DM, CVA, CHF, AAA, hypothyroidism, GERD, arthritis, recent femur fracture and dementia who presents s/p witnessed seizure.   12/5 - intubated, admitted to ICU 12/6 - self-extubated  Pt resting in bed at the time of assessment, RN just finished bathing. Pt confused, doesn't know she is at the hospital. Breakfast tray noted at bedside, RN notes poor PO intake, only a few bites consumed. Pt states she does not want a ensure shake but that she does like ice cream. Will order supplements to come on meal trays and augment intake. Pt is currently full code.   If poor PO intake continues, would recommend solidifying GOC to determine if more agressive nutrition interventions are desired by family.    Intake/Output Summary (Last 24 hours) at 01/11/2022 1140 Last data filed at 01/11/2022 1108 Gross per 24 hour  Intake 964.3 ml  Output 750 ml  Net 214.3 ml   Net IO Since Admission: 4,729.66 mL [01/11/22 1140]  Nutritionally Relevant Medications: Scheduled Meds:   atorvastatin  80 mg Oral Daily   cyanocobalamin  1,000 mcg Oral Daily   digoxin  0.125 mg Oral Q M,W,F   insulin aspart  0-15 Units Subcutaneous Q4H   memantine  10 mg Oral BID   mirtazapine  7.5 mg Oral QHS   pantoprazole  40 mg Oral Daily   PRN Meds:. docusate, polyethylene glycol  Labs Reviewed  NUTRITION - FOCUSED PHYSICAL EXAM: Flowsheet Row Most Recent Value  Orbital Region Moderate depletion  Upper Arm Region Mild depletion  Thoracic and Lumbar Region No depletion  Buccal Region Moderate depletion  Temple Region Moderate depletion  Clavicle Bone Region Mild depletion  Clavicle and Acromion Bone Region Mild depletion  Scapular Bone Region No depletion  Dorsal Hand Moderate depletion  Patellar Region Moderate depletion  Anterior Thigh Region Moderate depletion  Posterior Calf Region Severe depletion  Edema (RD Assessment) None  Hair Reviewed  Eyes Reviewed  Mouth Reviewed  Skin Reviewed  Nails Reviewed    Diet Order:   Diet Order             DIET DYS 3 Room service appropriate? Yes; Fluid consistency: Thin  Diet effective now                   EDUCATION NEEDS:  Not appropriate for education at this time  Skin:  Skin Assessment: Reviewed RN Assessment  Last BM:  unknown, PTA  Height:  Ht Readings from Last 1 Encounters:  01/08/22 5\' 3"  (1.6 m)    Weight:  Wt Readings from Last 1 Encounters:  01/11/22 76.9 kg  Ideal Body Weight:  52.3 kg  BMI:  Body mass index is 30.03 kg/m.  Estimated Nutritional Needs:  Kcal:  1700-1900kcal Protein:  90-115g Fluid:  1700-1950mL    Ranell Patrick, RD, LDN Clinical Dietitian RD pager # available in [facility]  After hours/weekend pager # available in [facility]

## 2022-01-11 NOTE — Progress Notes (Signed)
ATTESTATION & SIGNATURE   STAFF NOTE: I, Dr Ann Lions have personally reviewed patient's available data, including medical history, events of note, physical examination and test results as part of my evaluation. I have discussed with resident/NP and other care providers such as pharmacist, RN and RRT.  In addition,  I personally evaluated patient and elicited key findings of   S: Date of admit 01/08/2022 with LOS 3 for today 01/11/2022 : Stacy Grimes is  copd 3-4L  and other issues. Came in with new onset seizures.  Prob due to prior stroke and dementia. On keppra now.  HAs AICD so no MRI. Was intubaetd for seizures. Self extubate 01/09/22. Was on precedex but now off since 9pm last night  There is history of "fiestiness" v "agitatied" at home. Family prefers to care for her at home per resident  Not on vent On [place] o2 - 3L (baseline home)   has a past medical history of AAA (abdominal aortic aneurysm) ([place]), Anginal pain ([place]), Anxiety, Arthritis, Automatic implantable cardioverter-defibrillator in situ, Biventricular ICD  [employer] Jud[employer], COPD (chronic obstructive pulmonary disease) ([place]), Deafness, Dementia ([place]), Depression, Dizziness, Dysrhythmia, Fracture, GERD (gastroesophageal reflux disease), HFrEF (heart failure with reduced ejection fraction) ([place]), Hypertension, Hypothyroidism, Non-ischemic cardiomyopathy ([place]), Oxygen dependent, Palpitations, Shortness of breath dyspnea, Sleep apnea, and Wheezing.   has a past surgical history that includes Cholecystectomy; Bladder surgery; Knee surgery (Right); Wrist surgery (Right); Middle ear surgery; Cardiac defibrillator placement (4 yrs ago); Abdominal hysterectomy; Esophagogastroduodenoscopy (N/A, 07/06/2012); Savory dilation (N/A, 07/06/2012); Colonoscopy; Colonoscopy (N/A, 08/31/2012); Colon surgery; Cataract extraction w/PHACO (Left, 09/01/2014); Cataract extraction w/PHACO (Right, 09/29/2014); BIV ICD GENERATOR CHANGEOUT (N/A, 09/17/2016); Cardiac  catheterization; TEE without cardioversion (N/A, 06/23/2020); and Intramedullary (im) nail intertrochanteric (Right, 10/23/2021).   Latest Reference Range & Units 10/23/21 09:44 01/08/22 10:06  pH, Arterial 7.35 - 7.45  7.37 7.246 (L)  pCO2 arterial 32 - 48 mmHg 66 (HH) 60.3 (H)  pO2, Arterial 83 - 108 mmHg 60 (L) 485 (H)  (HH): Data is critically high (L): Data is abnormally low (H): Data is abnormally high  O:  Blood pressure (!) 144/88, pulse (!) 133, temperature 98.1 F (36.7 C), temperature source Axillary, resp. rate 18, height 5\' 3"  (1.6 m), weight 76.9 kg, SpO2 95 %.   Sleepy but arousable easily No distress On o2 Abd soft CTA bilaterally. No wheeze   LABS    PULMONARY Recent Labs  Lab 01/08/22 0826 01/08/22 1006  PHART  --  7.246*  PCO2ART  --  60.3*  PO2ART  --  485*  HCO3  --  26.2  TCO2 22 28  O2SAT  --  100    CBC Recent Labs  Lab 01/09/22 0347 01/10/22 0240 01/11/22 0809  HGB 10.3* 8.8* 8.9*  HCT 34.0* 30.0* 29.2*  WBC 8.5 7.2 7.7  PLT 309 239 250    COAGULATION Recent Labs  Lab 01/08/22 0822  INR 1.0    CARDIAC  No results for input(s): "TROPONINI" in the last 168 hours. No results for input(s): "PROBNP" in the last 168 hours.   CHEMISTRY Recent Labs  Lab 01/08/22 0822 01/08/22 0826 01/08/22 1006 01/08/22 1338 01/08/22 1639 01/09/22 0347 01/10/22 0240 01/11/22 0809  NA 143 145 143  --  141 141 144 142  K 3.2* 3.3* 2.8*  --  4.2 4.1 3.7 4.0  CL 105 105  --   --  110 108 109 106  CO2 18*  --   --   --  20* 25 28 27   GLUCOSE 206* 202*  --   --  133* 108* 116* 88  BUN 8 8  --   --  8 6* 7* 6*  CREATININE 1.11* 1.00  --   --  0.85 0.97 0.91 0.85  CALCIUM 7.0*  --   --   --  7.0* 7.5* 7.8* 7.9*  MG  --   --   --  1.0*  --  2.3  --   --   PHOS  --   --   --   --   --  3.4  --   --    Estimated Creatinine Clearance: 58.7 mL/min (by C-G formula based on SCr of 0.85 mg/dL).   LIVER Recent Labs  Lab 01/08/22 0822  AST 21   ALT 8  ALKPHOS 211*  BILITOT 0.3  PROT 6.5  ALBUMIN 2.4*  INR 1.0     INFECTIOUS Recent Labs  Lab 01/08/22 1150 01/09/22 0839 01/09/22 1033  LATICACIDVEN >9.0* 1.5 1.8     ENDOCRINE CBG (last 3)  Recent Labs    01/10/22 1945 01/10/22 2328 01/11/22 0337  GLUCAP 97 104* 87         IMAGING x24h  - image(s) personally visualized  -   highlighted in bold No results found.    A: SEizures Baseline copd with hypercapnia and hypoxemia Baseline intermittent agitation  P: Check abg -> need to make decisions on night bipap REst per resident Can move to med-surg but need to decide on night bipap baed on abg    Anti-infectives (From admission, onward)    None        Rest per NP/medical resident whose note is outlined above and that I agree with    Dr. Brand Males, M.D., [facility].C.P Pulmonary and Critical Care Medicine Staff Physician [facility] Pager: [phone], If no answer or between  15:00h - 7:00h: call [phone]  01/11/2022 12:12 PM

## 2022-01-11 NOTE — Progress Notes (Signed)
NAME:  Stacy Grimes, MRN:  [id number], DOB:  18-Apr-1950, LOS: 2 ADMISSION DATE:  01/08/2022, CONSULTATION DATE:  01/08/22 REFERRING MD:  Doren Custard CHIEF COMPLAINT:  Seizures, AMS   History of Present Illness:  Stacy Grimes is a 71 y.o. female who has a PMH as below. She was last in her usual state of health around 130 AM on 12/5 when she was up and talking. Her husband later heard a noise and shaking "like an earthquake". He woke up and found her snoring with abnormal respirations, right sided gaze deviation, some bleeding from right side of mouth. He called EMS who reported that en route to ED, she had a witnessed seizure event that abated after Versed administration.  In ED, she was taken to CT scanner and while there, she had hypoxia and respiratory distress requiring intubation. CT was negative. She was seen by neurology and loaded with Keppra while LTM was being connected. During CT, she had an episode of wide complex tachycardia that responded spontaneously with fluids. She was found to be hypokalemic and hypocalcemic and these were repleted.  PCCM called for admission.  No recent illness. Had recent admission 12/10/21 through 12/13/21 after fall where she was found to have nondisplaced fx of right supracondylar femur with severe DJD of the right knee following ORIF of right hip fx 10/23/21 She was seen by ortho who recommended conservative measures and NWB status. Besides that, nothing out of the ordinary over the last several days to weeks per her husband.  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; Personal history of colonic polyps; Dysphagia, unspecified(787.20); Unspecified constipation; Fatigue; Benign neoplasm of colon; Diverticulosis of colon (without mention of hemorrhage); Syncope; Dehydration; Elevated troponin I level; Left  knee pain; Chronic obstructive pulmonary disease, unspecified COPD type ([place]); Type 2 diabetes mellitus with hyperglycemia, unspecified whether long term insulin use ([place]); Acute ischemic stroke ([place]); Chronic congestive heart failure ([place]); Orthostatic hypotension; Hip fracture, unspecified laterality, closed, initial encounter ([place]); Hip fracture ([place]); CHF (congestive heart failure) ([place]); Shock circulatory ([place]); Femur fracture ([place]); Fall; Dementia ([place]); COVID-19 virus infection; Seizures ([place]); Acute on chronic respiratory failure with hypoxia and hypercapnia ([place]); Hypokalemia; Wide-complex tachycardia; and Status epilepticus ([place]) on their problem list.  Significant Hospital Events: Including procedures, antibiotic start and stop dates in addition to other pertinent events   12/5 - witnessed seizure-like activity s/p versed and keppra. On full ventilatory support. Wide complex tachycardia, thought to ST per EP. Admitted to ICU. 12/6 - self-extubated, requiring precedex 12/7 - precedex stopped in the evening   Interim History / Subjective:   Afebrile overnight. Not nearly as agitated today. Mental status appears to be at baseline per husband. Needs PT/OT and nutrition assessment. Transfer out of ICU.  Objective:  Blood pressure 107/63, pulse 64, temperature 98.1 F (36.7 C), temperature source Oral, resp. rate 11, height 5\' 3"  (1.6 m), weight 74.8 kg, SpO2 98 %.        Intake/Output Summary (Last 24 hours) at 01/10/2022 1209 Last data filed at 01/10/2022 0600 Gross per 24 hour  Intake 1246.21 ml  Output 380 ml  Net 866.21 ml   Filed Weights   01/08/22 0800 01/09/22 0500 01/10/22 0500  Weight: 77.6 kg 75.2 kg 74.8 kg    Examination: General: chronically ill-appearing elderly female, calmly laying in bed, pleasantly confused, NAD. HEENT: [place]/AT, no scleral icterus. CV: normal rate and regular rhythm, no  murmurs. Pulm: CTABL, saturating well on 3L [place]. No wheezing, rales,  rhonchi. Abdomen: soft, NT, ND, normoactive bowel sounds. MSK: no peripheral edema. Skin: warm and dry. Neuro: appears to be at baseline mental status per husband. Moving all extremities.    Assessment & Plan:   Status epilepticus Hx of CVA Hx of Dementia Precedex weaned off yesterday. She is much more calm and cooperative today, still intermittently refusing oral medications and food. Appears to be at her baseline mental status per husband. Mainly requiring PT/OT and nutrition assessment prior to discharge planning. Husband would like patient's new medications ordered to [place] (if possible) or ordered prior to discharge so that he can pick them up prior to D/C. Transferring out of ICU today. -appreciate neuro assistance -keppra 1000mg  BID until outpatient f/u with neurology -stopped valproate -ativan prn, PO klonopin preferred -AICD incompatible with MRI -continue home donepezil, memantine -consulted RD -PT/OT -transfer to med-surg -will need ambulatory referral to neurology at discharge -coordinate with husband for keppra at discharge  Chronic hypoxic and hypercapnic respiratory failure Hx of COPD on 3-4L O2 at home Patient self-extubated 12/6. She is doing well from a pulmonary standpoint, back on her home 3L O2 via [place] saturating >92%. Will obtain ABG to assess for hypercapnia and need for BiPAP.  -pulmonary hygiene -bronchodilators -f/u ABG, if remains hypercapnic may need BiPAP qhs  Sinus Tachycardia - resolved HFrEF (EF 20-25%) 2/2 NICM s/p ICD Hx of AAA HTN -appreciate EP assistance -replete electrolytes as needed, goal K >4 and Mg >2 -continue home lipitor, plavix -continue home digoxin -restarted home toprol-xl -holding home lasix and losartan, restart once able  Deconditioning Malnutrition -PT/OT eval (family would like patient to come home after discharge) -variable food intake, typically <50% of meal -consulted RD  Hx DM - SSI - holding home  metformin  Hx hypothyroidism -continue home synthroid  Hx depression - restarted home venlafaxine, mirtazipine, gabapentin  Best practice (evaluated daily):  Diet/type: regular DVT prophylaxis: lovenox GI prophylaxis: PPI Lines: N/A Foley:  no Code Status:  full code Last date of multidisciplinary goals of care discussion: updated son and spouse at bedside  Labs   CBC: Recent Labs  Lab 01/08/22 0822 01/08/22 0826 01/08/22 1006 01/09/22 0347 01/10/22 0240  WBC 8.1  --   --  8.5 7.2  NEUTROABS 6.7  --   --   --   --   HGB 10.4* 11.9* 11.6* 10.3* 8.8*  HCT 34.4* 35.0* 34.0* 34.0* 30.0*  MCV 88.9  --   --  87.0 89.6  PLT 267  --   --  309 742    Basic Metabolic Panel: Recent Labs  Lab 01/08/22 0822 01/08/22 0826 01/08/22 1006 01/08/22 1338 01/08/22 1639 01/09/22 0347 01/10/22 0240  NA 143 145 143  --  141 141 144  K 3.2* 3.3* 2.8*  --  4.2 4.1 3.7  CL 105 105  --   --  110 108 109  CO2 18*  --   --   --  20* 25 28  GLUCOSE 206* 202*  --   --  133* 108* 116*  BUN 8 8  --   --  8 6* 7*  CREATININE 1.11* 1.00  --   --  0.85 0.97 0.91  CALCIUM 7.0*  --   --   --  7.0* 7.5* 7.8*  MG  --   --   --  1.0*  --  2.3  --   PHOS  --   --   --   --   --  3.4  --    GFR: Estimated Creatinine Clearance: 54.2 mL/min (by C-G formula based on SCr of 0.91 mg/dL). Recent Labs  Lab 01/08/22 0822 01/08/22 1150 01/09/22 0347 01/09/22 0839 01/09/22 1033 01/10/22 0240  WBC 8.1  --  8.5  --   --  7.2  LATICACIDVEN  --  >9.0*  --  1.5 1.8  --    ABG    Component Value Date/Time   PHART 7.246 (L) 01/08/2022 1006   PCO2ART 60.3 (H) 01/08/2022 1006   PO2ART 485 (H) 01/08/2022 1006   HCO3 26.2 01/08/2022 1006   TCO2 28 01/08/2022 1006   ACIDBASEDEF 2.0 01/08/2022 1006   O2SAT 100 01/08/2022 1006     CBG: Recent Labs  Lab 01/09/22 1928 01/09/22 2347 01/10/22 0317 01/10/22 0728 01/10/22 1128  GLUCAP 113* 116* 103* 91 114*

## 2022-01-11 NOTE — Progress Notes (Signed)
pCO2 48 on VBG, no need for BiPAP.   Discussed with neurology, continue keppra 1g BID until outpatient neurology follow up.

## 2022-01-11 NOTE — Progress Notes (Signed)
Attempted to draw ABG from patient, but as soon as I stuck her she jerked her arm away & would not let me stick her anymore. MD made aware.

## 2022-01-12 ENCOUNTER — Inpatient Hospital Stay (HOSPITAL_COMMUNITY): Payer: Medicare HMO

## 2022-01-12 DIAGNOSIS — I5023 Acute on chronic systolic (congestive) heart failure: Secondary | ICD-10-CM

## 2022-01-12 DIAGNOSIS — J9622 Acute and chronic respiratory failure with hypercapnia: Secondary | ICD-10-CM | POA: Diagnosis not present

## 2022-01-12 DIAGNOSIS — J9621 Acute and chronic respiratory failure with hypoxia: Secondary | ICD-10-CM | POA: Diagnosis not present

## 2022-01-12 DIAGNOSIS — R569 Unspecified convulsions: Secondary | ICD-10-CM | POA: Diagnosis not present

## 2022-01-12 LAB — BASIC METABOLIC PANEL
Anion gap: 9 (ref 5–15)
BUN: <5 mg/dL — ABNORMAL LOW (ref 8–23)
CO2: 30 mmol/L (ref 22–32)
Calcium: 8.1 mg/dL — ABNORMAL LOW (ref 8.9–10.3)
Chloride: 105 mmol/L (ref 98–111)
Creatinine, Ser: 0.88 mg/dL (ref 0.44–1.00)
GFR, Estimated: >60 mL/min (ref >60)
Glucose, Bld: 83 mg/dL (ref 70–99)
Potassium: 3.5 mmol/L (ref 3.5–5.1)
Sodium: 144 mmol/L (ref 135–145)

## 2022-01-12 LAB — GLUCOSE, CAPILLARY
Glucose-Capillary: 101 mg/dL — ABNORMAL HIGH (ref 70–99)
Glucose-Capillary: 106 mg/dL — ABNORMAL HIGH (ref 70–99)
Glucose-Capillary: 108 mg/dL — ABNORMAL HIGH (ref 70–99)
Glucose-Capillary: 73 mg/dL (ref 70–99)
Glucose-Capillary: 83 mg/dL (ref 70–99)
Glucose-Capillary: 88 mg/dL (ref 70–99)
Glucose-Capillary: 89 mg/dL (ref 70–99)
Glucose-Capillary: 91 mg/dL (ref 70–99)

## 2022-01-12 LAB — CBC
HCT: 30.3 % — ABNORMAL LOW (ref 36.0–46.0)
Hemoglobin: 8.9 g/dL — ABNORMAL LOW (ref 12.0–15.0)
MCH: 26 pg (ref 26.0–34.0)
MCHC: 29.4 g/dL — ABNORMAL LOW (ref 30.0–36.0)
MCV: 88.6 fL (ref 80.0–100.0)
Platelets: 222 10*3/uL (ref 150–400)
RBC: 3.42 MIL/uL — ABNORMAL LOW (ref 3.87–5.11)
RDW: 21.2 % — ABNORMAL HIGH (ref 11.5–15.5)
WBC: 7.6 10*3/uL (ref 4.0–10.5)
nRBC: 0 % (ref 0.0–0.2)

## 2022-01-12 MED ORDER — POTASSIUM CHLORIDE CRYS ER 20 MEQ PO TBCR
40.0000 meq | EXTENDED_RELEASE_TABLET | Freq: Once | ORAL | Status: AC
  Administered 2022-01-12: 40 meq via ORAL
  Filled 2022-01-12: qty 2

## 2022-01-12 MED ORDER — FUROSEMIDE 10 MG/ML IJ SOLN
40.0000 mg | Freq: Once | INTRAMUSCULAR | Status: AC
  Administered 2022-01-12: 40 mg via INTRAVENOUS
  Filled 2022-01-12: qty 4

## 2022-01-12 NOTE — Progress Notes (Signed)
Inpatient Rehab Admissions Coordinator:   Per therapy recommendations, patient was screened for CIR candidacy by Clemens Catholic, MS, CCC-SLP. At this time, Pt. is not currently participating at a level where I believe she could tolerate the intensify of CIR; however,   Pt. may have potential to progress to becoming a potential CIR candidate, so CIR admissions team will follow and monitor for progress and participation with therapies and place consult order if Pt. appears to be an appropriate candidate. Please contact me with any questions.    Clemens Catholic, [place], [place] Admissions Coordinator  [phone] ([place]) [phone] (office)

## 2022-01-12 NOTE — Evaluation (Signed)
Occupational Therapy Evaluation Patient Details Name: Stacy Grimes MRN: [id number] DOB: 03-08-1950 Today's Date: 01/12/2022   History of Present Illness 71 year old female who presented with seizure Activity. Intubated, self-extubated. PMH: Dementia, essential hypertension, sleep apnea, diabetes mellitus type 2, chronic systolic congestive heart failure, CVA, spinal fx, AICD, COPD 3L at baseline, L ear deafness, R hip IM nail 10/2021 (per husband continues to be NWB RLE)   Clinical Impression   PTA pt living at home with her husband and able to complete squat pivot transfers while maintaining NWB status on RLE s(ince hip fx 10/2021) and required min A for LB ADL tasks. Active with HHOT/PT and nursing. Pt lethargic and apparently hallucinating, seeing bugs and a little girl in her room. Required +2 Max A with bed mobility and currently total A with all ADL tasks, including feeding at bed level. Talked with husband over the phone and he plans to take [place] home. Feel pt would benefit from rehab at [facility] to maximize functional level of independence to facilitate safe DC home with supportive husband. Acute OT to follow.      Recommendations for follow up therapy are one component of a multi-disciplinary discharge planning process, led by the attending physician.  Recommendations may be updated based on patient status, additional functional criteria and insurance authorization.   Follow Up Recommendations  [facility]    Assistance Recommended at Discharge Frequent or constant Supervision/Assistance  Patient can return home with the following Two people to help with walking and/or transfers;Two people to help with bathing/dressing/bathroom;Assistance with cooking/housework;Assistance with feeding;Direct supervision/assist for medications management;Direct supervision/assist for financial management;Assist for transportation;Help with stairs or ramp for entrance    Functional Status Assessment  Patient has  had a recent decline in their functional status and demonstrates the ability to make significant improvements in function in a reasonable and predictable amount of time.  Equipment Recommendations  Hospital bed;Other (comment) (possible hoyer)    Recommendations for Other Services PT consult;Speech consult     Precautions / Restrictions Precautions Precautions: Fall Restrictions Weight Bearing Restrictions: Yes RLE Weight Bearing: Non weight bearing Other Position/Activity Restrictions: per husband NWB      Mobility Bed Mobility Overal bed mobility: Needs Assistance Bed Mobility: Supine to Sit, Sit to Supine     Supine to sit: Max assist, +2 for physical assistance, HOB elevated Sit to supine: Max assist, +2 for physical assistance        Transfers                   General transfer comment: unable to attempt due to cogntiive status; will need to attempt with L drop arm recliner to transfer toward L; [place]      Balance Overall balance assessment: Needs assistance   Sitting balance-Leahy Scale: Poor                                     ADL either performed or assessed with clinical judgement   ADL Overall ADL's : Needs assistance/impaired                                       General ADL Comments: total A at this time     Vision   Additional Comments: will further assess; difficulty sustaining visual attention     Perception Perception Comments: poor  sense of midline postural control; R vias   Praxis      Pertinent Vitals/Pain Pain Assessment Pain Assessment: Faces Faces Pain Scale: Hurts even more Pain Location: generazlied Pain Descriptors / Indicators: Grimacing, Discomfort, Moaning Pain Intervention(s): Limited activity within patient's tolerance     Hand Dominance Right   Extremity/Trunk Assessment Upper Extremity Assessment Upper Extremity Assessment: Generalized weakness;LUE deficits/detail LUE  Deficits / Details: edematous LUE; unable to reach mouth with either UE due to weakness LUE Coordination: decreased fine motor;decreased gross motor   Lower Extremity Assessment Lower Extremity Assessment: Defer to PT evaluation   Cervical / Trunk Assessment Cervical / Trunk Assessment: Kyphotic   Communication Communication Communication: Expressive difficulties;HOH (per chart deaf L ear)   Cognition Arousal/Alertness: Lethargic Behavior During Therapy: Restless, Agitated, Impulsive Overall Cognitive Status: Impaired/Different from baseline Area of Impairment: Orientation, Attention, Memory, Following commands, Safety/judgement, Awareness, Problem solving                 Orientation Level: Disoriented to, Place, Time, Situation Current Attention Level: Focused Memory: Decreased recall of precautions, Decreased short-term memory Following Commands: Follows one step commands inconsistently Safety/Judgement: Decreased awareness of safety, Decreased awareness of deficits Awareness: Intellectual Problem Solving: Slow processing, Decreased initiation, Difficulty sequencing, Requires verbal cues, Requires tactile cues       General Comments       Exercises     Shoulder Instructions      Home Living Family/patient expects to be discharged to:: Private residence Living Arrangements: Spouse/significant other Available Help at Discharge: Family Type of Home: Mobile home Home Access: Ramped entrance     Home Layout: One level     Bathroom Shower/Tub: Teacher, early years/pre: Handicapped height Bathroom Accessibility: Yes   Home Equipment: Conservation officer, nature (2 wheels);BSC/3in1;Rollator (4 wheels);Cane - single point          Prior Functioning/Environment Prior Level of Function : Needs assist       Physical Assist : Mobility (physical);ADLs (physical) Mobility (physical): Transfers ADLs (physical): Bathing;Dressing;Toileting;IADLs Mobility Comments:  after hip fx was completing squat pivot trnasfers to [facility], wc with S; min A with LB ADL; active with HHOT/PT; prior to hip fx in September 2023, pt independent with mobility adn ADL with occasional use of cane          OT Problem List: Decreased strength;Decreased range of motion;Decreased activity tolerance;Impaired balance (sitting and/or standing);Impaired vision/perception;Decreased coordination;Decreased cognition;Decreased safety awareness;Decreased knowledge of use of DME or AE;Decreased knowledge of precautions;Cardiopulmonary status limiting activity;Obesity;Impaired UE functional use      OT Treatment/Interventions: Self-care/ADL training;Therapeutic exercise;Neuromuscular education;DME and/or AE instruction;Energy conservation;Therapeutic activities;Cognitive remediation/compensation;Visual/perceptual remediation/compensation;Patient/family education;Balance training    OT Goals(Current goals can be found in the care plan section) Acute Rehab OT Goals Patient Stated Goal: per husband to take [place] home OT Goal Formulation: With family Time For Goal Achievement: 01/26/22 Potential to Achieve Goals: Fair  OT Frequency: Min 2X/week    Co-evaluation PT/OT/SLP Co-Evaluation/Treatment: Yes Reason for Co-Treatment: Complexity of the patient's impairments (multi-system involvement);Necessary to address cognition/behavior during functional activity;For patient/therapist safety;To address functional/ADL transfers   OT goals addressed during session: ADL's and self-care      AM-PAC OT "6 Clicks" Daily Activity     Outcome Measure Help from another person eating meals?: Total Help from another person taking care of personal grooming?: Total Help from another person toileting, which includes using toliet, bedpan, or urinal?: Total Help from another person bathing (including washing, rinsing, drying)?: Total Help from another person  to put on and taking off regular upper body clothing?:  Total Help from another person to put on and taking off regular lower body clothing?: Total 6 Click Score: 6   End of Session Equipment Utilized During Treatment: Oxygen (3L) Nurse Communication: Mobility status;Precautions;Weight bearing status  Activity Tolerance: Patient limited by lethargy;Patient limited by fatigue Patient left: in bed;with call bell/phone within reach;with bed alarm set (chair position)  OT Visit Diagnosis: Unsteadiness on feet (R26.81);Other abnormalities of gait and mobility (R26.89);Muscle weakness (generalized) (M62.81);Other symptoms and signs involving the nervous system (R29.898);Other symptoms and signs involving cognitive function                Time: 4388-8757 OT Time Calculation (min): 27 min Charges:  OT General Charges $OT Visit: 1 Visit OT Evaluation $OT Eval Moderate Complexity: [place], OT/L   Acute OT Clinical Specialist Acute Rehabilitation Services Pager [phone] Office [phone]   [facility] 01/12/2022, 10:42 AM

## 2022-01-12 NOTE — Progress Notes (Signed)
[facility] PROGRESS NOTE   Stacy Grimes XFG:[id number] DOB: 11-Oct-1950 DOA: 01/08/2022  PCP: Cyndi Bender, PA-C  Brief History/Interval Summary: This is a 71 year old female with multiple comorbidities including dementia, essential hypertension, sleep apnea, diabetes mellitus type 2, chronic systolic congestive heart failure who presented with seizure Activity.  Required ventilator support and was in the ICU.  She was stabilized and then transferred to the floor.  Consultants: Critical care medicine.  Neurology  Procedures: EEG    Subjective/Interval History: Patient noted to be confused and distracted this morning.  Seems to be comfortable.  Unable to answer questions.  Noted to be awake.     Assessment/Plan:  Status epilepticus Patient seen by neurology.  Underwent EEG.  Started on Keppra.  Valproate was stopped. Could not do MRI brain since she does have a AICD which is compatible.  Acute on chronic respiratory failure with hypoxia Noted to be on high flow nasal cannula at 8 L/min.  Saturations in the late 90s.  Will wean down oxygen.  Discussed with nursing staff. Chest x-ray does suggest perhaps some volume overload.  Will give her a dose of furosemide as she does have a history of CHF. Apparently on 3 L of oxygen by nasal cannula at home.  She was noted to have a COVID-19 infection back in November. She was intubated and self extubated while she was in the ICU.  History of previous stroke and dementia Baseline mental status unclear.  She is noted to be on donepezil and memantine which is being continued.   Continue statin and Plavix.  Chronic systolic CHF status post AICD/history of AAA/essential hypertension Echo from September showed EF to be 40 to 45%.  EF was 20-25% in 2022 There was concern for wide-complex tachycardia.  Seen by electrophysiology.  Rhythm was thought to be sinus tachycardia.  No workup was thought to be necessary.  Device was  interrogated. Continue to monitor electrolytes. She has been continued on her metoprolol and digoxin.  Losartan is currently on hold.  She will be given a dose of Lasix as discussed above. Blood pressure is reasonably well-controlled.  Occasional high readings noted. Will give her potassium supplements today.  Hypothyroidism Continue levothyroxine.  Diabetes mellitus type 2 Holding metformin.  Continue SSI.  History of depression Noted to be on venlafaxine and mirtazapine.  Normocytic anemia No evidence of overt bleeding.  Monitor hemoglobin  Obesity Estimated body mass index is 30.03 kg/m as calculated from the following:   Height as of this encounter: 5\' 3"  (1.6 m).   Weight as of this encounter: 76.9 kg.  Moderate protein calorie malnutrition Nutrition Problem: Moderate Malnutrition (in the context of chronic illness) Etiology: poor appetite   DVT Prophylaxis: On Lovenox Code Status: Full code Family Communication: No family at bedside.  Will update husband later today Disposition Plan: To be determined.  PT and OT evaluation is pending  Status is: Inpatient Remains inpatient appropriate because: Seizures, acute respiratory failure with hypoxia      Medications: Scheduled:  arformoterol  15 mcg Nebulization BID   atorvastatin  80 mg Oral Daily   budesonide (PULMICORT) nebulizer solution  0.5 mg Nebulization BID   Chlorhexidine Gluconate Cloth  6 each Topical Daily   clonazepam  0.5 mg Oral BID   clopidogrel  75 mg Oral Daily   cyanocobalamin  1,000 mcg Oral Daily   digoxin  0.125 mg Oral Q M,W,F   donepezil  10 mg Oral QHS   enoxaparin (LOVENOX)  injection  40 mg Subcutaneous Q24H   gabapentin  300 mg Oral BID   insulin aspart  0-15 Units Subcutaneous Q4H   levETIRAcetam  1,000 mg Oral BID   levothyroxine  112 mcg Oral Q0600   memantine  10 mg Oral BID   metoprolol succinate  25 mg Oral Daily   mirtazapine  7.5 mg Oral QHS   multivitamin with minerals  1  tablet Oral Daily   mouth rinse  15 mL Mouth Rinse 4 times per day   pantoprazole  40 mg Oral Daily   sodium chloride flush  3 mL Intravenous Once   venlafaxine XR  75 mg Oral BID   Continuous: WIO:[id number], docusate, LORazepam, mouth rinse, polyethylene glycol  Antibiotics: Anti-infectives (From admission, onward)    None       Objective:  Vital Signs  Vitals:   01/11/22 2354 01/12/22 0458 01/12/22 0736 01/12/22 0846  BP: 139/82 (!) 153/76 (!) 142/73   Pulse: 99 99 99   Resp: 14 12 18    Temp: 98.2 F (36.8 C) 98.6 F (37 C) 98.4 F (36.9 C)   TempSrc:  Oral Oral   SpO2: 100% 99% 100% 100%  Weight:      Height:        Intake/Output Summary (Last 24 hours) at 01/12/2022 0940 Last data filed at 01/11/2022 1500 Gross per 24 hour  Intake 70.68 ml  Output 600 ml  Net -529.32 ml   Filed Weights   01/09/22 0500 01/10/22 0500 01/11/22 0444  Weight: 75.2 kg 74.8 kg 76.9 kg    General appearance: Awake alert.  In no distress.  Pleasantly confused Resp: Mildly tachypneic.  No use of accessory muscles.  Crackles bilaterally at the bases.  No wheezing or rhonchi. Cardio: S1-S2 is normal regular.  No S3-S4.  No rubs murmurs or bruit GI: Abdomen is soft.  Nontender nondistended.  Bowel sounds are present normal.  No masses organomegaly Extremities: No edema.  Physical deconditioning is noted   Lab Results:  Data Reviewed: I have personally reviewed following labs and reports of the imaging studies  CBC: Recent Labs  Lab 01/08/22 0822 01/08/22 0826 01/08/22 1006 01/09/22 0347 01/10/22 0240 01/11/22 0809 01/12/22 0302  WBC 8.1  --   --  8.5 7.2 7.7 7.6  NEUTROABS 6.7  --   --   --   --   --   --   HGB 10.4*   < > 11.6* 10.3* 8.8* 8.9* 8.9*  HCT 34.4*   < > 34.0* 34.0* 30.0* 29.2* 30.3*  MCV 88.9  --   --  87.0 89.6 87.7 88.6  PLT 267  --   --  309 239 250 222   < > = values in this interval not displayed.    Basic Metabolic Panel: Recent Labs  Lab  01/08/22 1338 01/08/22 1639 01/09/22 0347 01/10/22 0240 01/11/22 0809 01/12/22 0302  NA  --  141 141 144 142 144  K  --  4.2 4.1 3.7 4.0 3.5  CL  --  110 108 109 106 105  CO2  --  20* 25 28 27 30   GLUCOSE  --  133* 108* 116* 88 83  BUN  --  8 6* 7* 6* <5*  CREATININE  --  0.85 0.97 0.91 0.85 0.88  CALCIUM  --  7.0* 7.5* 7.8* 7.9* 8.1*  MG 1.0*  --  2.3  --   --   --   PHOS  --   --  3.4  --   --   --     GFR: Estimated Creatinine Clearance: 56.7 mL/min (by C-G formula based on SCr of 0.88 mg/dL).  Liver Function Tests: Recent Labs  Lab 01/08/22 0822  AST 21  ALT 8  ALKPHOS 211*  BILITOT 0.3  PROT 6.5  ALBUMIN 2.4*     Coagulation Profile: Recent Labs  Lab 01/08/22 0822  INR 1.0     CBG: Recent Labs  Lab 01/11/22 2356 01/12/22 0446 01/12/22 0447 01/12/22 0517 01/12/22 0738  GLUCAP 86 73 83 88 91     Recent Results (from the past 240 hour(s))  Blood Culture (routine x 2)     Status: None (Preliminary result)   Collection Time: 01/08/22  8:53 AM   Specimen: BLOOD RIGHT HAND  Result Value Ref Range Status   Specimen Description BLOOD RIGHT HAND  Final   Special Requests   Final    BOTTLES DRAWN AEROBIC AND ANAEROBIC Blood Culture results may not be optimal due to an inadequate volume of blood received in culture bottles   Culture   Final    NO GROWTH 4 DAYS Performed at [facility], [place] [address], [place], [place] [address]    Report Status PENDING  Incomplete  Blood Culture (routine x 2)     Status: None (Preliminary result)   Collection Time: 01/08/22  8:58 AM   Specimen: BLOOD LEFT HAND  Result Value Ref Range Status   Specimen Description BLOOD LEFT HAND  Final   Special Requests   Final    BOTTLES DRAWN AEROBIC AND ANAEROBIC Blood Culture results may not be optimal due to an inadequate volume of blood received in culture bottles   Culture   Final    NO GROWTH 4 DAYS Performed at [facility], [place] [address], [place],  [place] [address]    Report Status PENDING  Incomplete  Urine Culture     Status: None   Collection Time: 01/08/22 11:33 AM   Specimen: In/Out Cath Urine  Result Value Ref Range Status   Specimen Description IN/OUT CATH URINE  Final   Special Requests NONE  Final   Culture   Final    NO GROWTH Performed at [facility], [place] [address] [place], [place] [address]    Report Status 01/09/2022 FINAL  Final  MRSA Next Gen by PCR, Nasal     Status: None   Collection Time: 01/08/22  1:37 PM   Specimen: Nasal Mucosa; Nasal Swab  Result Value Ref Range Status   MRSA by PCR Next Gen NOT DETECTED NOT DETECTED Final    Comment: (NOTE) The GeneXpert MRSA Assay (FDA approved for NASAL specimens only), is one component of a comprehensive MRSA colonization surveillance program. It is not intended to diagnose MRSA infection nor to guide or monitor treatment for MRSA infections. Test performance is not FDA approved in patients less than 33 years old. Performed at [facility], [place] [address] [place], [place] [address]       Radiology Studies: No results found.     LOS: 4 days   Lekeith Wulf [employer] on [email or web address]  01/12/2022, 9:40 AM

## 2022-01-12 NOTE — Evaluation (Signed)
Physical Therapy Evaluation Patient Details Name: XITLALLY MOONEYHAM MRN: [id number] DOB: 28-Sep-1950 Today's Date: 01/12/2022  History of Present Illness  71 year old female who presented with seizure Activity. Intubated, self-extubated. PMH: Dementia, essential hypertension, sleep apnea, diabetes mellitus type 2, chronic systolic congestive heart failure, CVA, spinal fx, AICD, COPD 3L at baseline, L ear deafness, R hip IM nail 10/2021 (per husband continues to be NWB RLE)  Clinical Impression   Pt admitted secondary to problem above with deficits below. PTA patient was recovering at home s/p rt femur fx. Per husband, she remains NWB RLE and was able to complete lateral scoot transfers using a wheelchair for locomotion.  Pt currently requires +2 max-total assist due to lethargy and decr cognition. Unable to maintain balance at EOB or comprehend NWB stats for RLE, therefore unsafe to attempt transfer at this time.  Anticipate patient will benefit from PT to address problems listed below.Will continue to follow acutely to maximize functional mobility independence and safety.          Recommendations for follow up therapy are one component of a multi-disciplinary discharge planning process, led by the attending physician.  Recommendations may be updated based on patient status, additional functional criteria and insurance authorization.  Follow Up Recommendations Acute inpatient rehab (3hours/day) (husband plans to take her home; refuses SNF)      Assistance Recommended at Discharge Frequent or constant Supervision/Assistance  Patient can return home with the following  Two people to help with walking and/or transfers;Assistance with cooking/housework;Assistance with feeding;Direct supervision/assist for medications management;Direct supervision/assist for financial management;Assist for transportation;Help with stairs or ramp for entrance    Equipment Recommendations Other (comment) (hoyer lift)   Recommendations for Other Services       Functional Status Assessment Patient has had a recent decline in their functional status and demonstrates the ability to make significant improvements in function in a reasonable and predictable amount of time.     Precautions / Restrictions Precautions Precautions: Fall Restrictions Weight Bearing Restrictions: Yes RLE Weight Bearing: Non weight bearing Other Position/Activity Restrictions: per husband NWB      Mobility  Bed Mobility Overal bed mobility: Needs Assistance Bed Mobility: Supine to Sit, Sit to Supine     Supine to sit: Max assist, +2 for physical assistance, HOB elevated Sit to supine: Max assist, +2 for physical assistance        Transfers                   General transfer comment: unable to attempt due to cogntiive status; will need to attempt with L drop arm recliner to transfer toward L; Maximove currently    Ambulation/Gait                  Stairs            Wheelchair Mobility    Modified Rankin (Stroke Patients Only)       Balance Overall balance assessment: Needs assistance Sitting-balance support: No upper extremity supported, Feet unsupported Sitting balance-Leahy Scale: Poor Sitting balance - Comments: initially max-total assist with lean to her right (came up from rt side); progressed to min assist but with max cues for midline and to attend to therapist                                     Pertinent Vitals/Pain Pain Assessment Pain Assessment: Faces Faces Pain Scale: Hurts even  more Pain Location: generazlied Pain Descriptors / Indicators: Grimacing, Discomfort, Moaning Pain Intervention(s): Limited activity within patient's tolerance, Monitored during session, Repositioned    Home Living Family/patient expects to be discharged to:: Private residence Living Arrangements: Spouse/significant other Available Help at Discharge: Family Type of Home:  Mobile home Home Access: Ramped entrance       Home Layout: One level Home Equipment: Conservation officer, nature (2 wheels);BSC/3in1;Rollator (4 wheels);Cane - single point      Prior Function Prior Level of Function : Needs assist       Physical Assist : Mobility (physical);ADLs (physical) Mobility (physical): Transfers ADLs (physical): Bathing;Dressing;Toileting;IADLs Mobility Comments: after hip fx was completing squat pivot trnasfers to [facility], wc with S; min A with LB ADL; active with HHOT/PT; prior to hip fx in September 2023, pt independent with mobility adn ADL with occasional use of cane       Hand Dominance   Dominant Hand: Right    Extremity/Trunk Assessment   Upper Extremity Assessment Upper Extremity Assessment: Defer to OT evaluation LUE Deficits / Details: edematous LUE; unable to reach mouth with either UE due to weakness LUE Coordination: decreased fine motor;decreased gross motor    Lower Extremity Assessment Lower Extremity Assessment: Difficult to assess due to impaired cognition;Generalized weakness (Good AAROM rt hip)    Cervical / Trunk Assessment Cervical / Trunk Assessment: Kyphotic  Communication   Communication: Expressive difficulties;HOH (per chart deaf L ear)  Cognition Arousal/Alertness: Lethargic Behavior During Therapy: Restless, Agitated, Impulsive Overall Cognitive Status: Impaired/Different from baseline Area of Impairment: Orientation, Attention, Memory, Following commands, Safety/judgement, Awareness, Problem solving                 Orientation Level: Disoriented to, Place, Time, Situation Current Attention Level: Focused Memory: Decreased recall of precautions, Decreased short-term memory Following Commands: Follows one step commands inconsistently Safety/Judgement: Decreased awareness of safety, Decreased awareness of deficits Awareness: Intellectual Problem Solving: Slow processing, Decreased initiation, Difficulty sequencing,  Requires verbal cues, Requires tactile cues General Comments: hallucinating (seeing bugs and a little girl); thinks she can walk to bathroom        General Comments      Exercises     Assessment/Plan    PT Assessment Patient needs continued PT services  PT Problem List Decreased strength;Decreased activity tolerance;Decreased balance;Decreased mobility;Decreased cognition;Decreased knowledge of use of DME;Decreased safety awareness;Decreased knowledge of precautions;Obesity       PT Treatment Interventions DME instruction;Functional mobility training;Therapeutic activities;Therapeutic exercise;Balance training;Cognitive remediation;Patient/family education;Wheelchair mobility training    PT Goals (Current goals can be found in the Care Plan section)  Acute Rehab PT Goals Patient Stated Goal: unable to state PT Goal Formulation: Patient unable to participate in goal setting Time For Goal Achievement: 01/26/22 Potential to Achieve Goals: Good    Frequency Min 3X/week     Co-evaluation PT/OT/SLP Co-Evaluation/Treatment: Yes Reason for Co-Treatment: Complexity of the patient's impairments (multi-system involvement);Necessary to address cognition/behavior during functional activity;To address functional/ADL transfers;For patient/therapist safety PT goals addressed during session: Mobility/safety with mobility;Balance OT goals addressed during session: ADL's and self-care       AM-PAC PT "6 Clicks" Mobility  Outcome Measure Help needed turning from your back to your side while in a flat bed without using bedrails?: A Lot Help needed moving from lying on your back to sitting on the side of a flat bed without using bedrails?: Total Help needed moving to and from a bed to a chair (including a wheelchair)?: Total Help needed standing up from a  chair using your arms (e.g., wheelchair or bedside chair)?: Total Help needed to walk in hospital room?: Total Help needed climbing 3-5  steps with a railing? : Total 6 Click Score: 7    End of Session   Activity Tolerance: Patient limited by lethargy Patient left: in bed;with call bell/phone within reach;with bed alarm set;Other (comment) (in chair position) Nurse Communication: Mobility status;Need for lift equipment;Weight bearing status PT Visit Diagnosis: Other abnormalities of gait and mobility (R26.89);Muscle weakness (generalized) (M62.81)    Time: 1700-1749 PT Time Calculation (min) (ACUTE ONLY): 21 min   Charges:   PT Evaluation $PT Eval Low Complexity: [place], PT Acute Rehabilitation Services  Office [phone]   Rexanne Mano 01/12/2022, 11:48 AM

## 2022-01-13 ENCOUNTER — Inpatient Hospital Stay (HOSPITAL_COMMUNITY): Payer: Medicare HMO

## 2022-01-13 DIAGNOSIS — I5023 Acute on chronic systolic (congestive) heart failure: Secondary | ICD-10-CM | POA: Diagnosis not present

## 2022-01-13 DIAGNOSIS — R569 Unspecified convulsions: Secondary | ICD-10-CM | POA: Diagnosis not present

## 2022-01-13 DIAGNOSIS — J9621 Acute and chronic respiratory failure with hypoxia: Secondary | ICD-10-CM | POA: Diagnosis not present

## 2022-01-13 LAB — GLUCOSE, CAPILLARY
Glucose-Capillary: 89 mg/dL (ref 70–99)
Glucose-Capillary: 84 mg/dL (ref 70–99)
Glucose-Capillary: 88 mg/dL (ref 70–99)
Glucose-Capillary: 96 mg/dL (ref 70–99)
Glucose-Capillary: 81 mg/dL (ref 70–99)
Glucose-Capillary: 90 mg/dL (ref 70–99)

## 2022-01-13 LAB — BASIC METABOLIC PANEL
Anion gap: 11 (ref 5–15)
BUN: 6 mg/dL — ABNORMAL LOW (ref 8–23)
CO2: 37 mmol/L — ABNORMAL HIGH (ref 22–32)
Calcium: 8.1 mg/dL — ABNORMAL LOW (ref 8.9–10.3)
Chloride: 96 mmol/L — ABNORMAL LOW (ref 98–111)
Creatinine, Ser: 0.84 mg/dL (ref 0.44–1.00)
GFR, Estimated: >60 mL/min (ref >60)
Glucose, Bld: 85 mg/dL (ref 70–99)
Potassium: 4.1 mmol/L (ref 3.5–5.1)
Sodium: 144 mmol/L (ref 135–145)

## 2022-01-13 LAB — CULTURE, BLOOD (ROUTINE X 2)
Culture: NO GROWTH
Culture: NO GROWTH

## 2022-01-13 LAB — CBC
HCT: 34.6 % — ABNORMAL LOW (ref 36.0–46.0)
Hemoglobin: 10.6 g/dL — ABNORMAL LOW (ref 12.0–15.0)
MCH: 26.6 pg (ref 26.0–34.0)
MCHC: 30.6 g/dL (ref 30.0–36.0)
MCV: 86.9 fL (ref 80.0–100.0)
Platelets: 196 10*3/uL (ref 150–400)
RBC: 3.98 MIL/uL (ref 3.87–5.11)
RDW: 21 % — ABNORMAL HIGH (ref 11.5–15.5)
WBC: 10.9 10*3/uL — ABNORMAL HIGH (ref 4.0–10.5)
nRBC: 0 % (ref 0.0–0.2)

## 2022-01-13 LAB — BLOOD GAS, ARTERIAL
Acid-Base Excess: 19.4 mmol/L — ABNORMAL HIGH (ref 0.0–2.0)
Bicarbonate: 46 mmol/L — ABNORMAL HIGH (ref 20.0–28.0)
Drawn by: 28338
O2 Saturation: 93.7 %
Patient temperature: 36
pCO2 arterial: 56 mmHg — ABNORMAL HIGH (ref 32–48)
pH, Arterial: 7.52 — ABNORMAL HIGH (ref 7.35–7.45)
pO2, Arterial: 60 mmHg — ABNORMAL LOW (ref 83–108)

## 2022-01-13 LAB — MAGNESIUM: Magnesium: 1.8 mg/dL (ref 1.7–2.4)

## 2022-01-13 MED ORDER — FUROSEMIDE 10 MG/ML IJ SOLN
40.0000 mg | Freq: Two times a day (BID) | INTRAMUSCULAR | Status: DC
  Administered 2022-01-13 – 2022-01-15 (×4): 40 mg via INTRAVENOUS
  Filled 2022-01-13 (×4): qty 4

## 2022-01-13 MED ORDER — ACETAMINOPHEN 325 MG PO TABS
650.0000 mg | ORAL_TABLET | Freq: Four times a day (QID) | ORAL | Status: DC | PRN
  Administered 2022-01-13 – 2022-01-17 (×3): 650 mg via ORAL
  Filled 2022-01-13 (×2): qty 2

## 2022-01-13 MED ORDER — FUROSEMIDE 10 MG/ML IJ SOLN: 40.0000 mg | Freq: Once | INTRAMUSCULAR | Status: DC

## 2022-01-13 MED ORDER — FUROSEMIDE 10 MG/ML IJ SOLN
40.0000 mg | INTRAMUSCULAR | Status: AC
  Administered 2022-01-13: 40 mg via INTRAVENOUS
  Filled 2022-01-13: qty 4

## 2022-01-13 NOTE — Progress Notes (Signed)
[facility] PROGRESS NOTE   Stacy Grimes KVQ:[id number] DOB: Apr 01, 1950 DOA: 01/08/2022  PCP: Cyndi Bender, PA-C  Brief History/Interval Summary: This is a 71 year old female with multiple comorbidities including dementia, essential hypertension, sleep apnea, diabetes mellitus type 2, chronic systolic congestive heart failure who presented with seizure Activity.  Required ventilator support and was in the ICU.  She was stabilized and then transferred to the floor.  Consultants: Critical care medicine.  Neurology  Procedures: EEG    Subjective/Interval History: Patient is awake alert but confused and distracted.  No complaints offered.     Assessment/Plan:  Status epilepticus Patient seen by neurology.  Underwent EEG.  Started on Keppra.  Valproate was stopped. Could not do MRI brain since she does have a AICD which is compatible. Seems to have stabilized.  No further seizure activity noted.  Acute on chronic respiratory failure with hypoxia She was intubated and self extubated while she was in the ICU. Continues to be on high flow nasal cannula 6 to 8 L/min.  Wean down oxygen for saturations greater than 90%. Chest x-ray did suggest pulm edema.  She was given furosemide yesterday with good diuresis.  Will give additional dose of furosemide today. Apparently on 3 L of oxygen by nasal cannula at home.  She was noted to have a COVID-19 infection back in November.  History of previous stroke and dementia Baseline mental status unclear.  She is noted to be on donepezil and memantine which is being continued.  Continue statin and Plavix.  Chronic systolic CHF status post AICD/history of AAA/essential hypertension Echo from September showed EF to be 40 to 45%.  EF was 20-25% in 2022 There was concern for wide-complex tachycardia.  Seen by electrophysiology.  Rhythm was thought to be sinus tachycardia.  No workup was thought to be necessary.  Device was  interrogated. Continue to monitor electrolytes. She has been continued on her metoprolol and digoxin.  Losartan is currently on hold.   Given Lasix yesterday.  Will give additional dose today. Blood pressure is reasonably well-controlled.  Occasional high readings noted. Concern for left arm edema.  Will evaluate patient and decide on further course of action.  Hypothyroidism Continue levothyroxine.  Diabetes mellitus type 2 Holding metformin.  Continue SSI.  History of depression Noted to be on venlafaxine and mirtazapine.  Normocytic anemia No evidence of overt bleeding.  Monitor hemoglobin  Obesity Estimated body mass index is 28.47 kg/m as calculated from the following:   Height as of this encounter: 5\' 3"  (1.6 m).   Weight as of this encounter: 72.9 kg.  Moderate protein calorie malnutrition Nutrition Problem: Moderate Malnutrition (in the context of chronic illness) Etiology: poor appetite   DVT Prophylaxis: On Lovenox Code Status: Full code Family Communication: No family at bedside.  Will update husband later today.  He was updated yesterday. Disposition Plan: To be determined.  PT and OT evaluation is pending  Status is: Inpatient Remains inpatient appropriate because: Seizures, acute respiratory failure with hypoxia      Medications: Scheduled:  arformoterol  15 mcg Nebulization BID   atorvastatin  80 mg Oral Daily   budesonide (PULMICORT) nebulizer solution  0.5 mg Nebulization BID   Chlorhexidine Gluconate Cloth  6 each Topical Daily   clonazepam  0.5 mg Oral BID   clopidogrel  75 mg Oral Daily   cyanocobalamin  1,000 mcg Oral Daily   digoxin  0.125 mg Oral Q M,W,F   donepezil  10 mg Oral QHS  enoxaparin (LOVENOX) injection  40 mg Subcutaneous Q24H   gabapentin  300 mg Oral BID   insulin aspart  0-15 Units Subcutaneous Q4H   levETIRAcetam  1,000 mg Oral BID   levothyroxine  112 mcg Oral Q0600   memantine  10 mg Oral BID   metoprolol succinate  25  mg Oral Daily   mirtazapine  7.5 mg Oral QHS   multivitamin with minerals  1 tablet Oral Daily   mouth rinse  15 mL Mouth Rinse 4 times per day   pantoprazole  40 mg Oral Daily   sodium chloride flush  3 mL Intravenous Once   venlafaxine XR  75 mg Oral BID   Continuous: IOX:BDZHGDJME, docusate, LORazepam, mouth rinse, polyethylene glycol  Antibiotics: Anti-infectives (From admission, onward)    None       Objective:  Vital Signs  Vitals:   01/13/22 0344 01/13/22 0500 01/13/22 0747 01/13/22 0839  BP: (!) 113/97  137/78   Pulse: (!) 104  (!) 110   Resp: 14  18   Temp: 98.4 F (36.9 C)  98.3 F (36.8 C)   TempSrc: Axillary     SpO2: 100%  93% 96%  Weight:  72.9 kg    Height:        Intake/Output Summary (Last 24 hours) at 01/13/2022 0957 Last data filed at 01/13/2022 0636 Gross per 24 hour  Intake 60 ml  Output 1850 ml  Net -1790 ml    Filed Weights   01/10/22 0500 01/11/22 0444 01/13/22 0500  Weight: 74.8 kg 76.9 kg 72.9 kg    General appearance: Awake alert.  In no distress Resp: Clear to auscultation bilaterally.  Normal effort Cardio: S1-S2 is normal regular.  No S3-S4.  No rubs murmurs or bruit GI: Abdomen is soft.  Nontender nondistended.  Bowel sounds are present normal.  No masses organomegaly Extremities:  Left arm noted to be swollen. Neurologic:  No focal neurological deficits.     Lab Results:  Data Reviewed: I have personally reviewed following labs and reports of the imaging studies  CBC: Recent Labs  Lab 01/08/22 0822 01/08/22 0826 01/09/22 0347 01/10/22 0240 01/11/22 0809 01/12/22 0302 01/13/22 0258  WBC 8.1  --  8.5 7.2 7.7 7.6 10.9*  NEUTROABS 6.7  --   --   --   --   --   --   HGB 10.4*   < > 10.3* 8.8* 8.9* 8.9* 10.6*  HCT 34.4*   < > 34.0* 30.0* 29.2* 30.3* 34.6*  MCV 88.9  --  87.0 89.6 87.7 88.6 86.9  PLT 267  --  309 239 250 222 196   < > = values in this interval not displayed.     Basic Metabolic Panel: Recent  Labs  Lab 01/08/22 1338 01/08/22 1639 01/09/22 0347 01/10/22 0240 01/11/22 0809 01/12/22 0302 01/13/22 0258  NA  --    < > 141 144 142 144 144  K  --    < > 4.1 3.7 4.0 3.5 4.1  CL  --    < > 108 109 106 105 96*  CO2  --    < > 25 28 27 30  37*  GLUCOSE  --    < > 108* 116* 88 83 85  BUN  --    < > 6* 7* 6* <5* 6*  CREATININE  --    < > 0.97 0.91 0.85 0.88 0.84  CALCIUM  --    < > 7.5* 7.8*  7.9* 8.1* 8.1*  MG 1.0*  --  2.3  --   --   --  1.8  PHOS  --   --  3.4  --   --   --   --    < > = values in this interval not displayed.     GFR: Estimated Creatinine Clearance: 57.9 mL/min (by C-G formula based on SCr of 0.84 mg/dL).  Liver Function Tests: Recent Labs  Lab 01/08/22 0822  AST 21  ALT 8  ALKPHOS 211*  BILITOT 0.3  PROT 6.5  ALBUMIN 2.4*      Coagulation Profile: Recent Labs  Lab 01/08/22 0822  INR 1.0      CBG: Recent Labs  Lab 01/12/22 1559 01/12/22 2011 01/12/22 2332 01/13/22 0355 01/13/22 0748  GLUCAP 101* 89 106* 89 84      Recent Results (from the past 240 hour(s))  Blood Culture (routine x 2)     Status: None (Preliminary result)   Collection Time: 01/08/22  8:53 AM   Specimen: BLOOD RIGHT HAND  Result Value Ref Range Status   Specimen Description BLOOD RIGHT HAND  Final   Special Requests   Final    BOTTLES DRAWN AEROBIC AND ANAEROBIC Blood Culture results may not be optimal due to an inadequate volume of blood received in culture bottles   Culture   Final    NO GROWTH 4 DAYS Performed at [facility], [place] [address], [place], [place] [address]    Report Status PENDING  Incomplete  Blood Culture (routine x 2)     Status: None (Preliminary result)   Collection Time: 01/08/22  8:58 AM   Specimen: BLOOD LEFT HAND  Result Value Ref Range Status   Specimen Description BLOOD LEFT HAND  Final   Special Requests   Final    BOTTLES DRAWN AEROBIC AND ANAEROBIC Blood Culture results may not be optimal due to an inadequate volume of  blood received in culture bottles   Culture   Final    NO GROWTH 4 DAYS Performed at [facility], [place] [address], [place], [place] [address]    Report Status PENDING  Incomplete  Urine Culture     Status: None   Collection Time: 01/08/22 11:33 AM   Specimen: In/Out Cath Urine  Result Value Ref Range Status   Specimen Description IN/OUT CATH URINE  Final   Special Requests NONE  Final   Culture   Final    NO GROWTH Performed at [facility], [place] [address] [place], [place] [address]    Report Status 01/09/2022 FINAL  Final  MRSA Next Gen by PCR, Nasal     Status: None   Collection Time: 01/08/22  1:37 PM   Specimen: Nasal Mucosa; Nasal Swab  Result Value Ref Range Status   MRSA by PCR Next Gen NOT DETECTED NOT DETECTED Final    Comment: (NOTE) The GeneXpert MRSA Assay (FDA approved for NASAL specimens only), is one component of a comprehensive MRSA colonization surveillance program. It is not intended to diagnose MRSA infection nor to guide or monitor treatment for MRSA infections. Test performance is not FDA approved in patients less than 42 years old. Performed at [facility], [place] [address], [place], [place] [address]       Radiology Studies: DG CHEST PORT 1 VIEW  Result Date: 01/12/2022 CLINICAL DATA:  Hypoxia.  Follow-up study. EXAM: PORTABLE CHEST 1 VIEW COMPARISON:  01/08/2022 and older  exams. FINDINGS: Since the most recent prior exam, the endotracheal tube and nasal/orogastric tube have been removed. The left anterior chest wall biventricular cardioverter-defibrillator is stable. Lungs demonstrate bronchovascular interstitial prominence similar to the prior exam. Mild basilar atelectasis. No convincing edema or evidence of pneumonia. Suspect small effusions. No pneumothorax. IMPRESSION: 1. No acute findings. 2. Mild basilar atelectasis. Suspect small effusions. No convincing pulmonary edema and no evidence of pneumonia. Electronically Signed   By:  Lajean Manes M.D.   On: 01/12/2022 09:40       LOS: 5 days   [facility] Pager on [email or web address]  01/13/2022, 9:57 AM

## 2022-01-13 NOTE — Progress Notes (Signed)
Patient has removed her nasal cannula twice today.  O2 sats dropped to 79%,

## 2022-01-13 NOTE — Progress Notes (Signed)
   01/13/22 1116  Assess: if the MEWS score is Yellow or Red  Were vital signs taken at a resting state? No  Focused Assessment No change from prior assessment  Does the patient meet 2 or more of the SIRS criteria? No  Provider and Rapid Response Notified? Yes (Provider was notified of increased O2 requirements.  Back down to 7 liters now.)  Take Vital Signs  Increase Vital Sign Frequency  Yellow: Q 2hr X 2 then Q 4hr X 2, if remains yellow, continue Q 4hrs  Escalate  MEWS: Escalate Yellow: discuss with charge nurse/RN and consider discussing with provider and RRT  Notify: Charge Nurse/RN  Name of Charge Nurse/RN Notified Deloris Ping., RN  Date Charge Nurse/RN Notified 01/13/22  Time Charge Nurse/RN Notified 1100  Provider Notification  Provider Name/Title Dr. [place] Pink  Date Provider Notified 01/13/22  Time Provider Notified 1152  Method of Notification Page  Notification Reason Change in status  Provider response See new orders;At bedside  Document  Patient Outcome Other (Comment) (New orders received and implemented.)  Progress note created (see row info) Yes

## 2022-01-13 NOTE — Plan of Care (Signed)

## 2022-01-13 NOTE — Progress Notes (Signed)
SLP Cancellation Note  Patient Details Name: Stacy Grimes MRN: [id number] DOB: 05/21/1950   Cancelled treatment:       Reason Eval/Treat Not Completed: Patient declined, no reason specified (Pt was asleep upon SLP's entry. Pt's family requested that the SLP speak to him softly and ultimately asked that the swallow evaluation be deferred to allow the pt to sleep. SLP will therefore follow up on subsequent date.)  Stacy Grimes, [place], [place] Office number [phone]  Horton Marshall 01/13/2022, 2:28 PM

## 2022-01-13 NOTE — Plan of Care (Signed)
  Problem: Metabolic: Goal: Ability to maintain appropriate glucose levels will improve Outcome: Progressing   Problem: Nutritional: Goal: Progress toward achieving an optimal weight will improve Outcome: Progressing   Problem: Skin Integrity: Goal: Risk for impaired skin integrity will decrease Outcome: Progressing   Problem: Tissue Perfusion: Goal: Adequacy of tissue perfusion will improve Outcome: Progressing

## 2022-01-13 NOTE — Progress Notes (Signed)
Pt asymptomatic, with 9 beats of V-tach, VSS. Will continue to monitor. On call Floor Cvg informed.

## 2022-01-14 ENCOUNTER — Inpatient Hospital Stay (HOSPITAL_COMMUNITY): Payer: Medicare HMO

## 2022-01-14 DIAGNOSIS — J9621 Acute and chronic respiratory failure with hypoxia: Secondary | ICD-10-CM | POA: Diagnosis not present

## 2022-01-14 DIAGNOSIS — I5023 Acute on chronic systolic (congestive) heart failure: Secondary | ICD-10-CM | POA: Diagnosis not present

## 2022-01-14 DIAGNOSIS — R569 Unspecified convulsions: Secondary | ICD-10-CM | POA: Diagnosis not present

## 2022-01-14 DIAGNOSIS — M7989 Other specified soft tissue disorders: Secondary | ICD-10-CM | POA: Diagnosis not present

## 2022-01-14 LAB — GLUCOSE, CAPILLARY
Glucose-Capillary: 114 mg/dL — ABNORMAL HIGH (ref 70–99)
Glucose-Capillary: 128 mg/dL — ABNORMAL HIGH (ref 70–99)
Glucose-Capillary: 82 mg/dL (ref 70–99)
Glucose-Capillary: 89 mg/dL (ref 70–99)
Glucose-Capillary: 91 mg/dL (ref 70–99)
Glucose-Capillary: 97 mg/dL (ref 70–99)

## 2022-01-14 LAB — BASIC METABOLIC PANEL
Anion gap: 15 (ref 5–15)
BUN: 6 mg/dL — ABNORMAL LOW (ref 8–23)
CO2: 38 mmol/L — ABNORMAL HIGH (ref 22–32)
Calcium: 8 mg/dL — ABNORMAL LOW (ref 8.9–10.3)
Chloride: 90 mmol/L — ABNORMAL LOW (ref 98–111)
Creatinine, Ser: 1.02 mg/dL — ABNORMAL HIGH (ref 0.44–1.00)
GFR, Estimated: 58 mL/min — ABNORMAL LOW (ref >60)
Glucose, Bld: 88 mg/dL (ref 70–99)
Potassium: 2.8 mmol/L — ABNORMAL LOW (ref 3.5–5.1)
Sodium: 143 mmol/L (ref 135–145)

## 2022-01-14 LAB — MAGNESIUM: Magnesium: 1.7 mg/dL (ref 1.7–2.4)

## 2022-01-14 MED ORDER — POTASSIUM CHLORIDE 10 MEQ/100ML IV SOLN
10.0000 meq | INTRAVENOUS | Status: AC
  Administered 2022-01-14 (×2): 10 meq via INTRAVENOUS
  Filled 2022-01-14 (×2): qty 100

## 2022-01-14 MED ORDER — POTASSIUM CHLORIDE CRYS ER 20 MEQ PO TBCR
40.0000 meq | EXTENDED_RELEASE_TABLET | ORAL | Status: AC
  Administered 2022-01-14 (×2): 40 meq via ORAL
  Filled 2022-01-14 (×2): qty 2

## 2022-01-14 MED ORDER — MAGNESIUM SULFATE 2 GM/50ML IV SOLN
2.0000 g | Freq: Once | INTRAVENOUS | Status: AC
  Administered 2022-01-14: 2 g via INTRAVENOUS
  Filled 2022-01-14: qty 50

## 2022-01-14 MED ORDER — INSULIN ASPART 100 UNIT/ML IJ SOLN
0.0000 [IU] | Freq: Three times a day (TID) | INTRAMUSCULAR | Status: DC
  Administered 2022-01-17: 3 [IU] via SUBCUTANEOUS

## 2022-01-14 MED ORDER — METOPROLOL TARTRATE 5 MG/5ML IV SOLN: 2.5000 mg | Freq: Four times a day (QID) | INTRAVENOUS | Status: DC | PRN

## 2022-01-14 NOTE — Progress Notes (Signed)
Occupational Therapy Treatment Patient Details Name: Stacy Grimes MRN: [id number] DOB: 11/22/1950 Today's Date: 01/14/2022   History of present illness 71 year old female who presented with seizure Activity. Intubated, self-extubated. PMH: Dementia, essential hypertension, sleep apnea, diabetes mellitus type 2, chronic systolic congestive heart failure, CVA, spinal fx, AICD, COPD 3L at baseline, L ear deafness, R hip IM nail 10/2021 (per husband continues to be NWB RLE)   OT comments  Patient seen in conjunction with PT, with patient attempting to get OOB at PT/OT arrival. Patient with chorea-esque movement throughout session, poor command following, and requiring max A of 2 to sit EOB, but unable to maintain sitting balance without significant external assist. Patient's HR at 130 sitting EOB, and sustaining, therefore transfers not attempted. (RN notified at end of session). Patient also requiring 5L of oxygen at end of session. Patient placed in chair position at end of session, however despite external support, remained max A to complete basic ADLs. Recommendation downgraded to SNF due to current level. OT will continue to follow to address deficits listed below.    Recommendations for follow up therapy are one component of a multi-disciplinary discharge planning process, led by the attending physician.  Recommendations may be updated based on patient status, additional functional criteria and insurance authorization.    Follow Up Recommendations  Skilled nursing-short term rehab (<3 hours/day)     Assistance Recommended at Discharge Frequent or constant Supervision/Assistance (Needs 24/7 support)  Patient can return home with the following  Two people to help with walking and/or transfers;Two people to help with bathing/dressing/bathroom;Assistance with cooking/housework;Assistance with feeding;Direct supervision/assist for medications management;Direct supervision/assist for financial  management;Assist for transportation;Help with stairs or ramp for entrance   Equipment Recommendations  Hospital bed;Other (comment) Harrel Lemon)    Recommendations for Other Services      Precautions / Restrictions Precautions Precautions: Fall Restrictions Weight Bearing Restrictions: Yes RLE Weight Bearing: Non weight bearing Other Position/Activity Restrictions: per husband NWB       Mobility Bed Mobility Overal bed mobility: Needs Assistance Bed Mobility: Supine to Sit, Sit to Supine     Supine to sit: Max assist, +2 for physical assistance, HOB elevated Sit to supine: Max assist, +2 for physical assistance   General bed mobility comments: Patient with poor command following throughout, incremental assist provided from patient, however requiring assist to bring BLEs to floor and assistance at the trunk to sit up right. Max assist required to sit EOB, with poor activity tolerance    Transfers                   General transfer comment: unable to attempt due to cognition, chorea-esque movements for the duration off sitting EOB, and HR assessed at 130 and sustaining sitting EOB     Balance Overall balance assessment: Needs assistance Sitting-balance support: Bilateral upper extremity supported, Feet supported Sitting balance-Leahy Scale: Zero Sitting balance - Comments: chorea-esque movements throughout, unable to maintain sitting balance without max external assist                                   ADL either performed or assessed with clinical judgement   ADL Overall ADL's : Needs assistance/impaired Eating/Feeding: Maximal assistance;Sitting;Bed level Eating/Feeding Details (indicate cue type and reason): sitting EOB and in chair position, unable to bring cup to mouth despite mulitple attempts Grooming: Maximal assistance;Bed level Grooming Details (indicate cue type and reason): bed in  chair position, unable to bring washcloth to face without  significant assist             Lower Body Dressing: Total assistance;Bed level   Toilet Transfer: Maximal assistance;Total assistance;+2 for physical assistance;+2 for safety/equipment           Functional mobility during ADLs: Maximal assistance;+2 for physical assistance;+2 for safety/equipment;Cueing for sequencing;Cueing for safety General ADL Comments: Patient remais max to total A for all aspects of care    Extremity/Trunk Assessment Upper Extremity Assessment Upper Extremity Assessment: Generalized weakness LUE Deficits / Details: edematous LUE; unable to reach mouth with either UE due to weakness LUE Coordination: decreased fine motor;decreased gross motor            Vision   Additional Comments: Continued inability to maintain visual attention   Perception     Praxis      Cognition Arousal/Alertness: Lethargic Behavior During Therapy: Restless, Agitated, Impulsive Overall Cognitive Status: Impaired/Different from baseline Area of Impairment: Orientation, Attention, Memory, Following commands, Safety/judgement, Awareness, Problem solving                 Orientation Level: Disoriented to, Place, Time, Situation Current Attention Level: Focused Memory: Decreased recall of precautions, Decreased short-term memory Following Commands: Follows one step commands inconsistently Safety/Judgement: Decreased awareness of safety, Decreased awareness of deficits Awareness: Intellectual Problem Solving: Slow processing, Decreased initiation, Difficulty sequencing, Requires verbal cues, Requires tactile cues General Comments:  (Patient does not appear to be hallucinating, very poor attention (at most 5 seconds with a verbal and visual cues) noted R inattention, also attempting to give OT a phone number, however providing too many numbers)        Exercises      Shoulder Instructions       General Comments      Pertinent Vitals/ Pain       Pain  Assessment Pain Assessment: Faces Faces Pain Scale: Hurts whole lot Pain Location: generazlied Pain Descriptors / Indicators: Grimacing, Discomfort, Moaning Pain Intervention(s): Monitored during session, Repositioned  Home Living                                          Prior Functioning/Environment              Frequency  Min 2X/week        Progress Toward Goals  OT Goals(current goals can now be found in the care plan section)  Progress towards OT goals: Progressing toward goals  Acute Rehab OT Goals Patient Stated Goal: unable to state OT Goal Formulation: Patient unable to participate in goal setting Time For Goal Achievement: 01/26/22 Potential to Achieve Goals: Poor  Plan Discharge plan needs to be updated    Co-evaluation    PT/OT/SLP Co-Evaluation/Treatment: Yes Reason for Co-Treatment: Complexity of the patient's impairments (multi-system involvement);Necessary to address cognition/behavior during functional activity;For patient/therapist safety;To address functional/ADL transfers PT goals addressed during session: Mobility/safety with mobility;Balance;Strengthening/ROM OT goals addressed during session: ADL's and self-care;Strengthening/ROM      AM-PAC OT "6 Clicks" Daily Activity     Outcome Measure   Help from another person eating meals?: A Lot Help from another person taking care of personal grooming?: A Lot Help from another person toileting, which includes using toliet, bedpan, or urinal?: Total Help from another person bathing (including washing, rinsing, drying)?: Total Help from another person to put on and taking  off regular upper body clothing?: A Lot Help from another person to put on and taking off regular lower body clothing?: Total 6 Click Score: 9    End of Session Equipment Utilized During Treatment: Oxygen (Bumped up to 5L at end of session, RN notified)  OT Visit Diagnosis: Unsteadiness on feet (R26.81);Other  abnormalities of gait and mobility (R26.89);Muscle weakness (generalized) (M62.81);Other symptoms and signs involving the nervous system (R29.898);Other symptoms and signs involving cognitive function   Activity Tolerance Patient limited by lethargy;Patient limited by fatigue   Patient Left in bed;with call bell/phone within reach;with bed alarm set;Other (comment) (chair position)   Nurse Communication Mobility status;Precautions;Weight bearing status (Oxygen needs)        Time: 0223-3612 OT Time Calculation (min): 26 min  Charges: OT General Charges $OT Visit: 1 Visit OT Treatments $Self Care/Home Management : 8-22 mins  Corinne Ports E. Shaynna Husby, OTR/L Acute Rehabilitation Services [phone]   Ascencion Dike 01/14/2022, 9:48 AM

## 2022-01-14 NOTE — Progress Notes (Signed)
Physical Therapy Treatment Patient Details Name: Stacy Grimes MRN: [id number] DOB: 1950-09-04 Today's Date: 01/14/2022   History of Present Illness 71 year old female who presented with seizure Activity. Intubated, self-extubated. PMH: Dementia, essential hypertension, sleep apnea, diabetes mellitus type 2, chronic systolic congestive heart failure, CVA, spinal fx, AICD, COPD 3L at baseline, L ear deafness, R hip IM nail 10/2021 (per husband continues to be NWB RLE)    PT Comments    Patient seen in conjunction with OT due to complexity of deficits and need for 2 sets of skilled hands to maintain pt safely at EOB. Patient remains lethargic and was more difficult to understand her speech today. Continues with confused statements. Very poor sitting balance and noted chorea-like movements of legs and torso in both supine and sitting. Noted husband is refusing SNF (per 12/9 PT/OT notes), however pt cannot safely be cared for by one person at this time and AIR not currently appropriate for pt due to lethargy and confusion.     Recommendations for follow up therapy are one component of a multi-disciplinary discharge planning process, led by the attending physician.  Recommendations may be updated based on patient status, additional functional criteria and insurance authorization.  Follow Up Recommendations  Acute inpatient rehab (3hours/day) (husband plans to take her home; refuses SNF) Can patient physically be transported by private vehicle: No   Assistance Recommended at Discharge Frequent or constant Supervision/Assistance  Patient can return home with the following Two people to help with walking and/or transfers;Assistance with cooking/housework;Assistance with feeding;Direct supervision/assist for medications management;Direct supervision/assist for financial management;Assist for transportation;Help with stairs or ramp for entrance   Equipment Recommendations  Other (comment) (hoyer lift)     Recommendations for Other Services       Precautions / Restrictions Precautions Precautions: Fall Restrictions Weight Bearing Restrictions: Yes RLE Weight Bearing: Non weight bearing Other Position/Activity Restrictions: per husband NWB     Mobility  Bed Mobility Overal bed mobility: Needs Assistance Bed Mobility: Supine to Sit, Sit to Supine     Supine to sit: Max assist, +2 for physical assistance, HOB elevated Sit to supine: Max assist, +2 for physical assistance   General bed mobility comments: Patient with poor command following throughout, incremental assist provided from patient, however requiring assist to bring BLEs to floor and assistance at the trunk to sit up right. Max assist required to sit EOB, with poor activity tolerance    Transfers                   General transfer comment: unable to attempt due to cognition, chorea-esque movements for the duration off sitting EOB, and HR assessed at 130 and sustaining sitting EOB    Ambulation/Gait                   Stairs             Wheelchair Mobility    Modified Rankin (Stroke Patients Only)       Balance Overall balance assessment: Needs assistance Sitting-balance support: Bilateral upper extremity supported, Feet supported Sitting balance-Leahy Scale: Zero Sitting balance - Comments: chorea-esque movements throughout, unable to maintain sitting balance without max external assist                                    Cognition Arousal/Alertness: Lethargic Behavior During Therapy: Restless, Agitated, Impulsive Overall Cognitive Status: Impaired/Different from baseline Area of Impairment:  Orientation, Attention, Memory, Following commands, Safety/judgement, Awareness, Problem solving                 Orientation Level: Disoriented to, Place, Time, Situation Current Attention Level: Focused Memory: Decreased recall of precautions, Decreased short-term  memory Following Commands: Follows one step commands inconsistently Safety/Judgement: Decreased awareness of safety, Decreased awareness of deficits Awareness: Intellectual Problem Solving: Slow processing, Decreased initiation, Difficulty sequencing, Requires verbal cues, Requires tactile cues General Comments:  (Patient does not appear to be hallucinating, very poor attention (at most 5 seconds with a verbal and visual cues) noted R inattention, also attempting to give OT a phone number, however providing too many numbers)        Exercises      General Comments        Pertinent Vitals/Pain Pain Assessment Pain Assessment: Faces Faces Pain Scale: No hurt Pain Location: generazlied Pain Descriptors / Indicators: Grimacing, Discomfort, Moaning Pain Intervention(s): Limited activity within patient's tolerance, Monitored during session, Repositioned    Home Living                          Prior Function            PT Goals (current goals can now be found in the care plan section) Acute Rehab PT Goals Patient Stated Goal: unable to state Time For Goal Achievement: 01/26/22 Potential to Achieve Goals: Good Progress towards PT goals: Not progressing toward goals - comment    Frequency    Min 3X/week      PT Plan Discharge plan needs to be updated    Co-evaluation PT/OT/SLP Co-Evaluation/Treatment: Yes Reason for Co-Treatment: Complexity of the patient's impairments (multi-system involvement);Necessary to address cognition/behavior during functional activity;For patient/therapist safety PT goals addressed during session: Mobility/safety with mobility;Balance        AM-PAC PT "6 Clicks" Mobility   Outcome Measure  Help needed turning from your back to your side while in a flat bed without using bedrails?: A Lot Help needed moving from lying on your back to sitting on the side of a flat bed without using bedrails?: Total Help needed moving to and from a  bed to a chair (including a wheelchair)?: Total Help needed standing up from a chair using your arms (e.g., wheelchair or bedside chair)?: Total Help needed to walk in hospital room?: Total Help needed climbing 3-5 steps with a railing? : Total 6 Click Score: 7    End of Session   Activity Tolerance: Patient limited by lethargy Patient left: in bed;with call bell/phone within reach;with bed alarm set;Other (comment) (in chair position) Nurse Communication: Mobility status;Need for lift equipment;Weight bearing status PT Visit Diagnosis: Other abnormalities of gait and mobility (R26.89);Muscle weakness (generalized) (M62.81)     Time: 6045-4098 PT Time Calculation (min) (ACUTE ONLY): 27 min  Charges:  $Therapeutic Activity: 8-22 mins                      Arby Barrette, PT Acute Rehabilitation Services  Office [phone]    Rexanne Mano 01/14/2022, 3:29 PM

## 2022-01-14 NOTE — Evaluation (Signed)
Clinical/Bedside Swallow Evaluation Patient Details  Name: Stacy Grimes MRN: [id number] Date of Birth: [birth year]  Today's Date: 01/14/2022 Time: SLP Start Time (ACUTE ONLY): 0945 SLP Stop Time (ACUTE ONLY): 1004 SLP Time Calculation (min) (ACUTE ONLY): 19 min  Past Medical History:  Past Medical History:  Diagnosis Date   AAA (abdominal aortic aneurysm) (HCC)    Anginal pain ([place])    Anxiety    Arthritis    Automatic implantable cardioverter-defibrillator in situ    Biventricular ICD  [place]    COPD (chronic obstructive pulmonary disease) (HCC)    Deafness    left ear   Dementia (HCC)    Depression    Dizziness    Dysrhythmia    Fracture    history of spinal fracture   GERD (gastroesophageal reflux disease)    HFrEF (heart failure with reduced ejection fraction) ([place])    a. 2010 Cath: nonobs dzs, EF 15-20%; b. 09/2017 Echo: EF suboptimal. EF low nl; c. 04/2018 Echo: EF 40-45%.   Hypertension    Hypothyroidism    Non-ischemic cardiomyopathy ([place])    a. 2010 Cath: nonobs dzs, EF 15-20%; b. 2011 s/p SJM CRT-D; c. 09/2016 s/p Gen change; d. 09/2017 Echo: EF suboptimal. EF low nl; e. 04/2018 Echo: EF 40-45%.   Oxygen dependent    Palpitations    Shortness of breath dyspnea    Sleep apnea    Wheezing    Past Surgical History:  Past Surgical History:  Procedure Laterality Date   ABDOMINAL HYSTERECTOMY     BIV ICD GENERATOR CHANGEOUT N/A 09/17/2016   Procedure: BiV ICD Generator Changeout;  Surgeon: Deboraha Sprang, MD;  Location: [facility];  Service: Cardiovascular;  Laterality: N/A;   BLADDER SURGERY     CARDIAC CATHETERIZATION     CARDIAC DEFIBRILLATOR PLACEMENT  4 yrs ago   pacemaker   CATARACT EXTRACTION W/PHACO Left 09/01/2014   Procedure: CATARACT EXTRACTION PHACO AND INTRAOCULAR LENS PLACEMENT ([place]);  Surgeon: Lyla Glassing, MD;  Location: [facility];  Service: Ophthalmology;  Laterality: Left;  [place]: 1:12.1    CATARACT EXTRACTION W/PHACO Right 09/29/2014    Procedure: CATARACT EXTRACTION PHACO AND INTRAOCULAR LENS PLACEMENT ([facility]);  Surgeon: Lyla Glassing, MD;  Location: [facility];  Service: Ophthalmology;  Laterality: Right;  [place]: 00:52.7    CHOLECYSTECTOMY     COLON SURGERY     COLONOSCOPY     COLONOSCOPY N/A 08/31/2012   Procedure: COLONOSCOPY;  Surgeon: Inda Castle, MD;  Location: [facility];  Service: Endoscopy;  Laterality: N/A;   ESOPHAGOGASTRODUODENOSCOPY N/A 07/06/2012   Procedure: ESOPHAGOGASTRODUODENOSCOPY (EGD);  Surgeon: Lafayette Dragon, MD;  Location: [facility];  Service: Endoscopy;  Laterality: N/A;   INTRAMEDULLARY (IM) NAIL INTERTROCHANTERIC Right 10/23/2021   Procedure: INTRAMEDULLARY (IM) NAIL INTERTROCHANTERIC;  Surgeon: Corky Mull, MD;  Location: [facility];  Service: Orthopedics;  Laterality: Right;   KNEE SURGERY Right    MIDDLE EAR SURGERY     SAVORY DILATION N/A 07/06/2012   Procedure: SAVORY DILATION;  Surgeon: Lafayette Dragon, MD;  Location: [facility];  Service: Endoscopy;  Laterality: N/A;   TEE WITHOUT CARDIOVERSION N/A 06/23/2020   Procedure: TRANSESOPHAGEAL ECHOCARDIOGRAM (TEE);  Surgeon: Minna Merritts, MD;  Location: [facility];  Service: Cardiovascular;  Laterality: N/A;   WRIST SURGERY Right    HPI:  Pt is a 71 year old female who presented with seizure-like activity. ETT 12/5-12/6 (self-extubated). Pt passed [facility] swallow screen on 12/7 and diet initiated. EEGs 12/6, 12/7:  severe encephalopathy. PMH: Dementia, essential hypertension, sleep apnea, diabetes mellitus type 2, chronic systolic congestive heart failure, CVA, spinal fx, AICD, COPD 3L at baseline, L ear deafness, R hip IM nail 10/2021 (per husband continues to be NWB RLE). Seen by SLP at [facility] May, 2022 dysphagia 3 and thin liquids rx.    Assessment / Plan / Recommendation  Clinical Impression  Pt presents with difficulty swallowing that appears to be at least partially related to cognitive status. She is alert but not following commands for  completion of oral motor exam. She does not consistently cough or swallow to command. She has fluctuating oral holding, ranging from ~7 seconds to not swallowing at all until an additional bolus and verbal cueing are offered. Pt has consistent coughing that follows thin liquid trials. Occasional delayed coughing is noted in general, but no overt coughing that immediately follows nectar thick liquids or purees. Per chart, pt had initially passed the [facility] swallow screen on 12/6 so suspect there was a change in function observed 12/9 to warrant SLP evaluation being ordered. RN says that the last time she had this pt, she was eating and drinking without overt difficulty. It's possible for function to fluctuate if mentation is a contributing factor, but if there has been concern for swallowing since 12/9, would go ahead and try to proceed with MBS. Until this can be scheduled, will adjust diet to purees and nectar thick liquids with careful monitoring during PO intake. SLP Visit Diagnosis: Dysphagia, pharyngoesophageal phase (R13.14);Dysphagia, unspecified (R13.10)    Aspiration Risk  Moderate aspiration risk;Risk for inadequate nutrition/hydration    Diet Recommendation Dysphagia 1 (Puree);Nectar-thick liquid   Liquid Administration via: Cup;Straw Medication Administration: Crushed with puree Supervision: Staff to assist with self feeding;Full supervision/cueing for compensatory strategies Compensations: Minimize environmental distractions;Slow rate;Small sips/bites Postural Changes: Seated upright at 90 degrees    Other  Recommendations Oral Care Recommendations: Oral care BID Other Recommendations: Have oral suction available    Recommendations for follow up therapy are one component of a multi-disciplinary discharge planning process, led by the attending physician.  Recommendations may be updated based on patient status, additional functional criteria and insurance authorization.  Follow up  Recommendations  (tba)      Assistance Recommended at Discharge    Functional Status Assessment    Frequency and Duration            Prognosis Prognosis for Safe Diet Advancement: Good Barriers to Reach Goals: Cognitive deficits      Swallow Study   General HPI: Pt is a 71 year old female who presented with seizure-like activity. ETT 12/5-12/6 (self-extubated). Pt passed [facility] swallow screen on 12/7 and diet initiated. EEGs 12/6, 12/7:  severe encephalopathy. PMH: Dementia, essential hypertension, sleep apnea, diabetes mellitus type 2, chronic systolic congestive heart failure, CVA, spinal fx, AICD, COPD 3L at baseline, L ear deafness, R hip IM nail 10/2021 (per husband continues to be NWB RLE). Seen by SLP at [facility] May, 2022 dysphagia 3 and thin liquids rx. Type of Study: Bedside Swallow Evaluation Previous Swallow Assessment: see HPI Diet Prior to this Study: Dysphagia 3 (soft);Thin liquids Temperature Spikes Noted: No Respiratory Status: Nasal cannula History of Recent Intubation: Yes Length of Intubations (days): 1 days Date extubated: 01/09/22 (self-extubation) Behavior/Cognition: Alert;Requires cueing Oral Cavity Assessment: Within Functional Limits Oral Care Completed by SLP: No Oral Cavity - Dentition: Edentulous Self-Feeding Abilities: Total assist Patient Positioning: Upright in bed Baseline Vocal Quality: Hoarse (mild) Volitional Cough: Cognitively unable to elicit Volitional Swallow:  Unable to elicit    Oral/Motor/Sensory Function Overall Oral Motor/Sensory Function:  (not following commands to assess)   Ice Chips Ice chips: Not tested   Thin Liquid Thin Liquid: Impaired Presentation: Straw Oral Phase Functional Implications: Oral holding Pharyngeal  Phase Impairments: Suspected delayed Swallow;Cough - Immediate    Nectar Thick Nectar Thick Liquid: Impaired Presentation: Straw Oral phase functional implications: Oral holding   Honey Thick Honey Thick Liquid: Not  tested   Puree Puree: Impaired Presentation: Spoon Oral Phase Functional Implications: Oral holding Pharyngeal Phase Impairments: Suspected delayed Swallow   Solid     Solid: Not tested      Osie Bond., M.A. [place] Office [phone]  Secure chat preferred  01/14/2022,10:16 AM

## 2022-01-14 NOTE — Progress Notes (Signed)
Modified Barium Swallow Progress Note  Patient Details  Name: Stacy Grimes MRN: [id number] Date of Birth: [birth year]  Today's Date: 01/14/2022  Modified Barium Swallow completed.  Full report located under Chart Review in the Imaging Section.  Brief recommendations include the following:  Clinical Impression  Patient is presenting with a mild-moderate oropharyngeal dysphagia with suspected significant impact from cognition. Patient was constantly moving around in chair in radiology suite, tilting head back then forward, regardless of presence of PO's. She required moderate cues to encourage her to swallow but was able to take puree by spoon, liquids by cup and straw. With puree solids and nectar thick liquids she exhibited swallow initiation delays to vallecular sinus but with thin liquids she exhibited swallow initiaiton delay to the pyriform sinus. One instance of silent deep penetration to the vocal cords which did not clear was observed with large cup sip of thin liquids but no other instances even of penetration, were observed. Patient exhibited trace residuals in vallecular and pyriform sinus with nectar and thin liquids, and mild vallecular residuals with puree solids. Patient will be at a high aspiration risk secondary to her cognition and inability to sit still or adequately attend to and manage PO's. SLP recommending continue with  Dys 1 (puree) , continue with nectar thick liquids and ensure full supervision at all times with PO's. SLP will continue to follow for diet toleration and patient's ability to upgrade with both solids and liquids.   Swallow Evaluation Recommendations       SLP Diet Recommendations: Nectar thick liquid;Dysphagia 1 (Puree) solids   Liquid Administration via: Cup;Straw   Medication Administration: Crushed with puree   Supervision: Full supervision/cueing for compensatory strategies;Staff to assist with self feeding   Compensations: Minimize  environmental distractions;Slow rate;Small sips/bites   Postural Changes: Seated upright at 90 degrees   Oral Care Recommendations: Oral care BID;Staff/trained caregiver to provide oral care   Other Recommendations: Have oral suction available    Dannial Monarch 01/14/2022,3:23 PM

## 2022-01-14 NOTE — Progress Notes (Signed)
[facility] PROGRESS NOTE   Stacy Grimes IHK:[id number] DOB: [birth year] DOA: 01/08/2022  PCP: Cyndi Bender, PA-C  Brief History/Interval Summary: This is a 71 year old female with multiple comorbidities including dementia, essential hypertension, sleep apnea, diabetes mellitus type 2, chronic systolic congestive heart failure who presented with seizure Activity.  Required ventilator support and was in the ICU.  She was stabilized and then transferred to the floor.  Consultants: Critical care medicine.  Neurology  Procedures: EEG    Subjective/Interval History: Patient is distracted.  Not answering any questions this morning.  Noted to be restless.  Assessment/Plan:  Status epilepticus Patient seen by neurology.  Underwent EEG.  Started on Keppra.  Valproate was stopped. Could not do MRI brain since she does have a AICD which is compatible. Seems to have stabilized.  No further seizure activity noted.  Acute on chronic respiratory failure with hypoxia She was intubated and self extubated while she was in the ICU. Patient subsequently developed pulmonary edema.  Required high flow nasal cannula 6 to 8 L.  Being diuresed.  See below. Apparently on 3 L of oxygen by nasal cannula at home.  She was noted to have a COVID-19 infection back in November.  Acute on Chronic systolic CHF status post AICD/history of AAA/essential hypertension Echo from September showed EF to be 40 to 45%.  EF was 20-25% in 2022 There was concern for wide-complex tachycardia.  Seen by electrophysiology.  Rhythm was thought to be sinus tachycardia.  No workup was thought to be necessary.  Device was interrogated. She has been continued on her metoprolol and digoxin.  Losartan is currently on hold.   Chest x-ray yesterday showed pulmonary edema.  Started on IV diuretics.  Seems to have diuresed well.  Oxygen requirements have improved. Episodes of nonsustained VT noted on telemetry.  Correct  electrolytes. Noted to be tachycardic this morning.  To receive her metoprolol today.  May need to increase the dose.  Also noted to be on digoxin.  Will check digoxin level tomorrow.  Hypokalemia Likely due to diuretics.  Check magnesium level.  Aggressively replace potassium.  Left arm swelling Probably due to IV infiltration.  Doppler studies pending.  History of previous stroke and dementia Baseline mental status unclear.  She is noted to be on donepezil and memantine which is being continued.  Continue statin and Plavix. Be restless.  Likely due to acute medical issues.  Hypothyroidism Continue levothyroxine.  Diabetes mellitus type 2 Holding metformin.  Continue SSI.  History of depression Noted to be on venlafaxine and mirtazapine.  Normocytic anemia No evidence of overt bleeding.  Monitor hemoglobin  Obesity Estimated body mass index is 28 kg/m as calculated from the following:   Height as of this encounter: 5\' 3"  (1.6 m).   Weight as of this encounter: 71.7 kg.  Moderate protein calorie malnutrition Nutrition Problem: Moderate Malnutrition (in the context of chronic illness) Etiology: poor appetite   DVT Prophylaxis: On Lovenox Code Status: Full code Family Communication: No family at bedside.  Husband was updated yesterday.  Will update him again today. Disposition Plan: Seen by PT and OT.  SNF is recommended.  Status is: Inpatient Remains inpatient appropriate because: Seizures, acute respiratory failure with hypoxia      Medications: Scheduled:  arformoterol  15 mcg Nebulization BID   atorvastatin  80 mg Oral Daily   budesonide (PULMICORT) nebulizer solution  0.5 mg Nebulization BID   Chlorhexidine Gluconate Cloth  6 each Topical Daily  clonazepam  0.5 mg Oral BID   clopidogrel  75 mg Oral Daily   cyanocobalamin  1,000 mcg Oral Daily   digoxin  0.125 mg Oral Q M,W,F   donepezil  10 mg Oral QHS   enoxaparin (LOVENOX) injection  40 mg Subcutaneous  Q24H   furosemide  40 mg Intravenous Q12H   gabapentin  300 mg Oral BID   insulin aspart  0-15 Units Subcutaneous Q4H   levETIRAcetam  1,000 mg Oral BID   levothyroxine  112 mcg Oral Q0600   memantine  10 mg Oral BID   metoprolol succinate  25 mg Oral Daily   mirtazapine  7.5 mg Oral QHS   multivitamin with minerals  1 tablet Oral Daily   mouth rinse  15 mL Mouth Rinse 4 times per day   pantoprazole  40 mg Oral Daily   potassium chloride  40 mEq Oral Q4H   sodium chloride flush  3 mL Intravenous Once   venlafaxine XR  75 mg Oral BID   Continuous:  potassium chloride     MVH:[id number], albuterol, docusate, LORazepam, mouth rinse, polyethylene glycol  Antibiotics: Anti-infectives (From admission, onward)    None       Objective:  Vital Signs  Vitals:   01/14/22 0414 01/14/22 0800 01/14/22 0852 01/14/22 0853  BP: (!) 120/95 (!) 112/57    Pulse: 88 (!) 110    Resp: 18 18    Temp: 98.1 F (36.7 C) 98.3 F (36.8 C)    TempSrc: Oral Oral    SpO2: 96% 91% 96% 96%  Weight:      Height:        Intake/Output Summary (Last 24 hours) at 01/14/2022 1017 Last data filed at 01/14/2022 0511 Gross per 24 hour  Intake --  Output 1800 ml  Net -1800 ml    Filed Weights   01/11/22 0444 01/13/22 0500 01/14/22 0413  Weight: 76.9 kg 72.9 kg 71.7 kg    General appearance: Awake alert.  In no distress.  Distracted.  Restless. Resp: Clear to auscultation bilaterally.  Normal effort Cardio: S1-S2 is normal regular.  No S3-S4.  No rubs murmurs or bruit GI: Abdomen is soft.  Nontender nondistended.  Bowel sounds are present normal.  No masses organomegaly Extremities: No edema.  Left arm swelling is noted.   Lab Results:  Data Reviewed: I have personally reviewed following labs and reports of the imaging studies  CBC: Recent Labs  Lab 01/08/22 0822 01/08/22 0826 01/09/22 0347 01/10/22 0240 01/11/22 0809 01/12/22 0302 01/13/22 0258  WBC 8.1  --  8.5 7.2 7.7 7.6  10.9*  NEUTROABS 6.7  --   --   --   --   --   --   HGB 10.4*   < > 10.3* 8.8* 8.9* 8.9* 10.6*  HCT 34.4*   < > 34.0* 30.0* 29.2* 30.3* 34.6*  MCV 88.9  --  87.0 89.6 87.7 88.6 86.9  PLT 267  --  309 239 250 222 196   < > = values in this interval not displayed.     Basic Metabolic Panel: Recent Labs  Lab 01/08/22 1338 01/08/22 1639 01/09/22 0347 01/10/22 0240 01/11/22 0809 01/12/22 0302 01/13/22 0258 01/14/22 0412  NA  --    < > 141 144 142 144 144 143  K  --    < > 4.1 3.7 4.0 3.5 4.1 2.8*  CL  --    < > 108 109 106 105 96* 90*  CO2  --    < > 25 28 27 30  37* 38*  GLUCOSE  --    < > 108* 116* 88 83 85 88  BUN  --    < > 6* 7* 6* <5* 6* 6*  CREATININE  --    < > 0.97 0.91 0.85 0.88 0.84 1.02*  CALCIUM  --    < > 7.5* 7.8* 7.9* 8.1* 8.1* 8.0*  MG 1.0*  --  2.3  --   --   --  1.8  --   PHOS  --   --  3.4  --   --   --   --   --    < > = values in this interval not displayed.     GFR: Estimated Creatinine Clearance: 47.3 mL/min (A) (by C-G formula based on SCr of 1.02 mg/dL (H)).  Liver Function Tests: Recent Labs  Lab 01/08/22 0822  AST 21  ALT 8  ALKPHOS 211*  BILITOT 0.3  PROT 6.5  ALBUMIN 2.4*      Coagulation Profile: Recent Labs  Lab 01/08/22 0822  INR 1.0      CBG: Recent Labs  Lab 01/13/22 2013 01/13/22 2058 01/14/22 0004 01/14/22 0333 01/14/22 0806  GLUCAP 81 90 91 82 89      Recent Results (from the past 240 hour(s))  Blood Culture (routine x 2)     Status: None   Collection Time: 01/08/22  8:53 AM   Specimen: BLOOD RIGHT HAND  Result Value Ref Range Status   Specimen Description BLOOD RIGHT HAND  Final   Special Requests   Final    BOTTLES DRAWN AEROBIC AND ANAEROBIC Blood Culture results may not be optimal due to an inadequate volume of blood received in culture bottles   Culture   Final    NO GROWTH 5 DAYS Performed at [facility], [place] [address], [place], [place] [address]    Report Status 01/13/2022 FINAL  Final   Blood Culture (routine x 2)     Status: None   Collection Time: 01/08/22  8:58 AM   Specimen: BLOOD LEFT HAND  Result Value Ref Range Status   Specimen Description BLOOD LEFT HAND  Final   Special Requests   Final    BOTTLES DRAWN AEROBIC AND ANAEROBIC Blood Culture results may not be optimal due to an inadequate volume of blood received in culture bottles   Culture   Final    NO GROWTH 5 DAYS Performed at [facility], [place] [address] [place], [place] [address]    Report Status 01/13/2022 FINAL  Final  Urine Culture     Status: None   Collection Time: 01/08/22 11:33 AM   Specimen: In/Out Cath Urine  Result Value Ref Range Status   Specimen Description IN/OUT CATH URINE  Final   Special Requests NONE  Final   Culture   Final    NO GROWTH Performed at [facility], [place] [address], [place], [place] [address]    Report Status 01/09/2022 FINAL  Final  MRSA Next Gen by PCR, Nasal     Status: None   Collection Time: 01/08/22  1:37 PM   Specimen: Nasal Mucosa; Nasal Swab  Result Value Ref Range Status   MRSA by PCR Next Gen NOT DETECTED NOT DETECTED Final    Comment: (NOTE) The GeneXpert MRSA Assay (FDA approved for NASAL specimens only), is one component of a comprehensive MRSA colonization surveillance program.  It is not intended to diagnose MRSA infection nor to guide or monitor treatment for MRSA infections. Test performance is not FDA approved in patients less than 42 years old. Performed at [facility], [place] [address] [place], [place] [address]       Radiology Studies: DG CHEST PORT 1 VIEW  Result Date: 01/13/2022 CLINICAL DATA:  [id number] Hypoxia [id number] EXAM: PORTABLE CHEST 1 VIEW COMPARISON:  01/12/2022 FINDINGS: Cardiac silhouette is prominent. There is pulmonary interstitial prominence with vascular congestion. No focal consolidation. No pneumothorax identified. There might be small layering effusions. Aorta is calcified. There is a left-sided  pacemaker. IMPRESSION: Findings suggest CHF. Electronically Signed   By: Sammie Bench M.D.   On: 01/13/2022 10:44       LOS: 6 days   [facility] Pager on [email or web address]  01/14/2022, 10:17 AM

## 2022-01-14 NOTE — Care Management Important Message (Signed)
Important Message  Patient Details  Name: Stacy Grimes MRN: [id number] Date of Birth: 05/16/1950   Medicare Important Message Given:  Yes     Reda Citron [address] 01/14/2022, 3:53 PM

## 2022-01-14 NOTE — Progress Notes (Signed)
Inpatient Rehabilitation Admissions Coordinator   Patient continues to not be at a level to pursue ac CIR admit due to her lethargy and confusion per therapy notes.  Danne Baxter, RN, MSN Rehab Admissions Coordinator [phone] 01/14/2022 4:14 PM

## 2022-01-14 NOTE — Progress Notes (Signed)
VASCULAR LAB    Left upper extremity venous duplex has been performed.  See CV proc for preliminary results.  Messaged results to Dr. Maryland Pink via secure chat  Sharion Dove, RVT 01/14/2022, 12:36 PM

## 2022-01-15 ENCOUNTER — Inpatient Hospital Stay (HOSPITAL_COMMUNITY): Payer: Medicare HMO

## 2022-01-15 DIAGNOSIS — J9621 Acute and chronic respiratory failure with hypoxia: Secondary | ICD-10-CM | POA: Diagnosis not present

## 2022-01-15 DIAGNOSIS — I5023 Acute on chronic systolic (congestive) heart failure: Secondary | ICD-10-CM | POA: Diagnosis not present

## 2022-01-15 DIAGNOSIS — R569 Unspecified convulsions: Secondary | ICD-10-CM | POA: Diagnosis not present

## 2022-01-15 LAB — BASIC METABOLIC PANEL
Anion gap: 14 (ref 5–15)
BUN: 14 mg/dL (ref 8–23)
CO2: 38 mmol/L — ABNORMAL HIGH (ref 22–32)
Calcium: 8.2 mg/dL — ABNORMAL LOW (ref 8.9–10.3)
Chloride: 91 mmol/L — ABNORMAL LOW (ref 98–111)
Creatinine, Ser: 1.17 mg/dL — ABNORMAL HIGH (ref 0.44–1.00)
GFR, Estimated: 50 mL/min — ABNORMAL LOW (ref >60)
Glucose, Bld: 85 mg/dL (ref 70–99)
Potassium: 3.1 mmol/L — ABNORMAL LOW (ref 3.5–5.1)
Sodium: 143 mmol/L (ref 135–145)

## 2022-01-15 LAB — DIGOXIN LEVEL: Digoxin Level: 0.5 ng/mL — ABNORMAL LOW (ref 0.8–2.0)

## 2022-01-15 LAB — GLUCOSE, CAPILLARY
Glucose-Capillary: 103 mg/dL — ABNORMAL HIGH (ref 70–99)
Glucose-Capillary: 125 mg/dL — ABNORMAL HIGH (ref 70–99)
Glucose-Capillary: 107 mg/dL — ABNORMAL HIGH (ref 70–99)
Glucose-Capillary: 109 mg/dL — ABNORMAL HIGH (ref 70–99)

## 2022-01-15 LAB — MAGNESIUM: Magnesium: 2.4 mg/dL (ref 1.7–2.4)

## 2022-01-15 LAB — AMMONIA: Ammonia: 25 umol/L (ref 9–35)

## 2022-01-15 MED ORDER — LORAZEPAM 2 MG/ML IJ SOLN
0.5000 mg | Freq: Once | INTRAMUSCULAR | Status: AC
  Administered 2022-01-15: 0.5 mg via INTRAVENOUS
  Filled 2022-01-15: qty 1

## 2022-01-15 MED ORDER — VENLAFAXINE HCL ER 75 MG PO CP24
75.0000 mg | ORAL_CAPSULE | Freq: Two times a day (BID) | ORAL | Status: DC
  Administered 2022-01-17: 75 mg via ORAL
  Filled 2022-01-15 (×2): qty 1

## 2022-01-15 MED ORDER — FUROSEMIDE 10 MG/ML IJ SOLN
40.0000 mg | Freq: Every day | INTRAMUSCULAR | Status: DC
  Administered 2022-01-16 – 2022-01-17 (×2): 40 mg via INTRAVENOUS
  Filled 2022-01-15 (×2): qty 4

## 2022-01-15 MED ORDER — POTASSIUM CHLORIDE CRYS ER 20 MEQ PO TBCR
40.0000 meq | EXTENDED_RELEASE_TABLET | Freq: Once | ORAL | Status: AC
  Administered 2022-01-15: 40 meq via ORAL
  Filled 2022-01-15: qty 2

## 2022-01-15 MED ORDER — LACTATED RINGERS IV SOLN: INTRAVENOUS | Status: DC

## 2022-01-15 MED ORDER — POTASSIUM CHLORIDE CRYS ER 20 MEQ PO TBCR
20.0000 meq | EXTENDED_RELEASE_TABLET | Freq: Once | ORAL | Status: AC
  Administered 2022-01-15: 20 meq via ORAL
  Filled 2022-01-15: qty 1

## 2022-01-15 MED ORDER — POTASSIUM CHLORIDE 10 MEQ/100ML IV SOLN
10.0000 meq | INTRAVENOUS | Status: AC
  Administered 2022-01-15 (×2): 10 meq via INTRAVENOUS
  Filled 2022-01-15 (×2): qty 100

## 2022-01-15 MED ORDER — GABAPENTIN 300 MG PO CAPS
300.0000 mg | ORAL_CAPSULE | Freq: Every day | ORAL | Status: DC
  Administered 2022-01-16: 300 mg via ORAL
  Filled 2022-01-15: qty 1

## 2022-01-15 MED ORDER — POLYETHYLENE GLYCOL 3350 17 G PO PACK
17.0000 g | PACK | Freq: Every day | ORAL | Status: DC
  Administered 2022-01-15 – 2022-01-17 (×3): 17 g via ORAL
  Filled 2022-01-15 (×3): qty 1

## 2022-01-15 MED ORDER — CLONAZEPAM 0.25 MG PO TBDP
0.2500 mg | ORAL_TABLET | Freq: Two times a day (BID) | ORAL | Status: DC
  Administered 2022-01-15 – 2022-01-17 (×4): 0.25 mg via ORAL
  Filled 2022-01-15 (×4): qty 1

## 2022-01-15 NOTE — Progress Notes (Addendum)
[facility] PROGRESS NOTE   Stacy Grimes WLN:[id number] DOB: 1950/03/19 DOA: 01/08/2022  PCP: Cyndi Bender, PA-C  Brief History/Interval Summary: This is a 71 year old female with multiple comorbidities including dementia, essential hypertension, sleep apnea, diabetes mellitus type 2, chronic systolic congestive heart failure who presented with seizure Activity.  Required ventilator support and was in the ICU.  She was stabilized and then transferred to the floor.  Consultants: Critical care medicine.  Neurology  Procedures: EEG    Subjective/Interval History: Patient mentioned that she is feeling better.  However patient is quite distracted.  Does not answer all questions appropriately.    Assessment/Plan:  Status epilepticus Patient seen by neurology.  Underwent EEG.  Started on Keppra.  Valproate was stopped. Could not do MRI brain since she does have a AICD which is compatible. Seems to have stabilized.  No further seizure activity noted. Patient noted to be more encephalopathic over the last 24 hours.  More restless than she has been previously.  Moving all of her extremities.  No obvious focal neurological deficits noted. Noted to be on gabapentin.  Will decrease the dose.  Will decrease the dose of clonazepam.  Will hold her venlafaxine for a day.  Check ammonia and digoxin levels.  Keppra could also be contributing.  Discussed with Dr. Cheral Marker with neurology who will evaluate the patient.  Acute on chronic respiratory failure with hypoxia She was intubated and self extubated while she was in the ICU. Patient subsequently developed pulmonary edema.  Required high flow nasal cannula 6 to 8 L.  Being diuresed.  See below. Apparently on 3 L of oxygen by nasal cannula at home.  She was noted to have a COVID-19 infection back in November.  Acute on Chronic systolic CHF status post AICD/history of AAA/essential hypertension Echo from September showed EF to be 40 to  45%.  EF was 20-25% in 2022 There was concern for wide-complex tachycardia.  Seen by electrophysiology.  Rhythm was thought to be sinus tachycardia.  No workup was thought to be necessary.  Device was interrogated. She has been continued on her metoprolol and digoxin.  Losartan is currently on hold.   Chest x-ray showed pulmonary edema.  Patient was started on IV diuretics.  She has diuresed well.  Rising creatinine noted which is likely due to furosemide.  Will decrease the dose of Lasix.  Recheck labs tomorrow.  Monitor urine output. X-ray from this morning shows improvement in pulmonary edema.  Rising creatinine noted which is likely due to furosemide.  Will decrease the dose of Lasix. X-ray from this morning shows improvement in pulmonary edema. Episodes of nonsustained VT noted on telemetry.  Correct electrolytes. Continue metoprolol digoxin.  Will check digoxin level tomorrow morning.  When it was last checked earlier this month it was 0.6.  Hypokalemia Likely due to diuretics.  Continue to aggressively replete.  Recheck labs tomorrow.  Magnesium is 2.4.    Superficial vein thrombosis left cephalic vein Noted to have left arm swelling.  Likely due to IV infiltration.  SVT noted on Doppler study.  No DVT.  Warm compresses.     History of previous stroke and dementia Baseline mental status unclear.  She is noted to be on donepezil and memantine which is being continued.  Continue statin and Plavix. Occasional restlessness is noted.  Hypothyroidism Continue levothyroxine.  Diabetes mellitus type 2 Holding metformin.  Continue SSI.  CBGs are reasonably well-controlled.  History of depression Noted to be on venlafaxine and  mirtazapine.  Normocytic anemia No evidence of overt bleeding.  Monitor hemoglobin periodically.  Obesity Estimated body mass index is 28 kg/m as calculated from the following:   Height as of this encounter: 5\' 3"  (1.6 m).   Weight as of this encounter: 71.7  kg.  Moderate protein calorie malnutrition Nutrition Problem: Moderate Malnutrition (in the context of chronic illness) Etiology: poor appetite Poor oral intake noted over the last 24 hours.  Considering her dementia and numerous other medical problems it may not be unreasonable to involve palliative care for goals of care conversation.  DVT Prophylaxis: On Lovenox Code Status: Full code Family Communication: No family at bedside.  Husband was updated yesterday.   Disposition Plan: Seen by PT and OT.  CIR is recommended but not thought to be a good candidate for CIR.  Apparently husband has been has been declining SNF.  Status is: Inpatient Remains inpatient appropriate because: Seizures, acute respiratory failure with hypoxia      Medications: Scheduled:  arformoterol  15 mcg Nebulization BID   atorvastatin  80 mg Oral Daily   budesonide (PULMICORT) nebulizer solution  0.5 mg Nebulization BID   Chlorhexidine Gluconate Cloth  6 each Topical Daily   clonazepam  0.5 mg Oral BID   clopidogrel  75 mg Oral Daily   cyanocobalamin  1,000 mcg Oral Daily   digoxin  0.125 mg Oral Q M,W,F   donepezil  10 mg Oral QHS   enoxaparin (LOVENOX) injection  40 mg Subcutaneous Q24H   [START ON 01/16/2022] furosemide  40 mg Intravenous Daily   gabapentin  300 mg Oral BID   insulin aspart  0-15 Units Subcutaneous TID AC & HS   levETIRAcetam  1,000 mg Oral BID   levothyroxine  112 mcg Oral Q0600   memantine  10 mg Oral BID   metoprolol succinate  25 mg Oral Daily   mirtazapine  7.5 mg Oral QHS   multivitamin with minerals  1 tablet Oral Daily   mouth rinse  15 mL Mouth Rinse 4 times per day   pantoprazole  40 mg Oral Daily   sodium chloride flush  3 mL Intravenous Once   venlafaxine XR  75 mg Oral BID   Continuous:  UKG:[id number], albuterol, docusate, LORazepam, metoprolol tartrate, mouth rinse, polyethylene glycol  Antibiotics: Anti-infectives (From admission, onward)    None        Objective:  Vital Signs  Vitals:   01/15/22 0534 01/15/22 0748 01/15/22 0749 01/15/22 0752  BP: (!) 117/42   121/61  Pulse:    100  Resp:    20  Temp:    98.6 F (37 C)  TempSrc:      SpO2:  94% 94% 96%  Weight:      Height:        Intake/Output Summary (Last 24 hours) at 01/15/2022 1001 Last data filed at 01/15/2022 0930 Gross per 24 hour  Intake 118 ml  Output 1500 ml  Net -1382 ml    Filed Weights   01/11/22 0444 01/13/22 0500 01/14/22 0413  Weight: 76.9 kg 72.9 kg 71.7 kg    General appearance: Awake alert.  In no distress.  Distracted.  Restless. Resp: Improved effort.  Fewer crackles compared to last few days.  No wheezing or rhonchi. Cardio: S1-S2 is normal regular.  No S3-S4.  No rubs murmurs or bruit GI: Abdomen is soft.  Nontender nondistended.  Bowel sounds are present normal.  No masses organomegaly Extremities: Left arm swelling is  noted. Moving all 4 extremities.  Lab Results:  Data Reviewed: I have personally reviewed following labs and reports of the imaging studies  CBC: Recent Labs  Lab 01/09/22 0347 01/10/22 0240 01/11/22 0809 01/12/22 0302 01/13/22 0258  WBC 8.5 7.2 7.7 7.6 10.9*  HGB 10.3* 8.8* 8.9* 8.9* 10.6*  HCT 34.0* 30.0* 29.2* 30.3* 34.6*  MCV 87.0 89.6 87.7 88.6 86.9  PLT 309 239 250 222 196     Basic Metabolic Panel: Recent Labs  Lab 01/08/22 1338 01/08/22 1639 01/09/22 0347 01/10/22 0240 01/11/22 0809 01/12/22 0302 01/13/22 0258 01/14/22 0412 01/14/22 0643 01/15/22 0244  NA  --    < > 141   < > 142 144 144 143  --  143  K  --    < > 4.1   < > 4.0 3.5 4.1 2.8*  --  3.1*  CL  --    < > 108   < > 106 105 96* 90*  --  91*  CO2  --    < > 25   < > 27 30 37* 38*  --  38*  GLUCOSE  --    < > 108*   < > 88 83 85 88  --  85  BUN  --    < > 6*   < > 6* <5* 6* 6*  --  14  CREATININE  --    < > 0.97   < > 0.85 0.88 0.84 1.02*  --  1.17*  CALCIUM  --    < > 7.5*   < > 7.9* 8.1* 8.1* 8.0*  --  8.2*  MG 1.0*  --  2.3   --   --   --  1.8  --  1.7 2.4  PHOS  --   --  3.4  --   --   --   --   --   --   --    < > = values in this interval not displayed.     GFR: Estimated Creatinine Clearance: 41.2 mL/min (A) (by C-G formula based on SCr of 1.17 mg/dL (H)).    CBG: Recent Labs  Lab 01/14/22 0806 01/14/22 1152 01/14/22 1609 01/14/22 1950 01/15/22 0558  GLUCAP 89 114* 128* 97 103*      Recent Results (from the past 240 hour(s))  Blood Culture (routine x 2)     Status: None   Collection Time: 01/08/22  8:53 AM   Specimen: BLOOD RIGHT HAND  Result Value Ref Range Status   Specimen Description BLOOD RIGHT HAND  Final   Special Requests   Final    BOTTLES DRAWN AEROBIC AND ANAEROBIC Blood Culture results may not be optimal due to an inadequate volume of blood received in culture bottles   Culture   Final    NO GROWTH 5 DAYS Performed at [facility], [place] [address] [place], [place] [address]    Report Status 01/13/2022 FINAL  Final  Blood Culture (routine x 2)     Status: None   Collection Time: 01/08/22  8:58 AM   Specimen: BLOOD LEFT HAND  Result Value Ref Range Status   Specimen Description BLOOD LEFT HAND  Final   Special Requests   Final    BOTTLES DRAWN AEROBIC AND ANAEROBIC Blood Culture results may not be optimal due to an inadequate volume of blood received in culture bottles   Culture   Final    NO GROWTH 5 DAYS Performed  at [facility], [place] [address] [place], [place] [address]    Report Status 01/13/2022 FINAL  Final  Urine Culture     Status: None   Collection Time: 01/08/22 11:33 AM   Specimen: In/Out Cath Urine  Result Value Ref Range Status   Specimen Description IN/OUT CATH URINE  Final   Special Requests NONE  Final   Culture   Final    NO GROWTH Performed at [facility], [place] [address], [place], [place] [address]    Report Status 01/09/2022 FINAL  Final  MRSA Next Gen by PCR, Nasal     Status: None   Collection Time: 01/08/22  1:37 PM    Specimen: Nasal Mucosa; Nasal Swab  Result Value Ref Range Status   MRSA by PCR Next Gen NOT DETECTED NOT DETECTED Final    Comment: (NOTE) The GeneXpert MRSA Assay (FDA approved for NASAL specimens only), is one component of a comprehensive MRSA colonization surveillance program. It is not intended to diagnose MRSA infection nor to guide or monitor treatment for MRSA infections. Test performance is not FDA approved in patients less than 77 years old. Performed at [facility], [place] [address] [place], [place] [address]       Radiology Studies: DG CHEST PORT 1 VIEW  Result Date: 01/15/2022 CLINICAL DATA:  Congestive heart failure. Shortness of breath and cough. EXAM: PORTABLE CHEST 1 VIEW COMPARISON:  AP chest 01/13/2022, 01/12/2022, 12/10/2021; CT chest 09/21/2009 FINDINGS: Left chest wall AICD with leads overlying the right atrium and right ventricle as well as the coronary sinus. Heart size is again mildly enlarged. Mediastinal contours are within normal limits. There is again cephalization of pulmonary vasculature. No overt pulmonary edema. Bibasilar bronchovascular crowding. No focal airspace opacity to indicate pneumonia. No pleural effusion pneumothorax. No acute skeletal abnormality. Mild to moderate left glenohumeral osteoarthritis. IMPRESSION: 1. Cardiomegaly with pulmonary vascular congestion. No overt pulmonary edema. 2. Bibasilar bronchovascular crowding. No focal airspace opacity to indicate pneumonia. Electronically Signed   By: Yvonne Kendall M.D.   On: 01/15/2022 08:24   VAS [place] UPPER EXTREMITY VENOUS DUPLEX  Result Date: 01/14/2022 UPPER VENOUS STUDY  Patient Name:  Stacy Grimes [facility]  Date of Exam:   01/14/2022 Medical Rec #: [id number]         Accession #:    [id number] Date of Birth: [birth year]          Patient Gender: F Patient Age:   76 years Exam Location:  [facility] Procedure:      VAS [place] UPPER EXTREMITY VENOUS DUPLEX Referring Phys: Bonnielee Haff  --------------------------------------------------------------------------------  Indications: Swelling, and IV infiltration Comparison Study: No prior study on file Performing Technologist: Sharion Dove RVS  Examination Guidelines: A complete evaluation includes B-mode imaging, spectral Doppler, color Doppler, and power Doppler as needed of all accessible portions of each vessel. Bilateral testing is considered an integral part of a complete examination. Limited examinations for reoccurring indications may be performed as noted.  Right Findings: +----------+------------+---------+-----------+----------+-------+ RIGHT     CompressiblePhasicitySpontaneousPropertiesSummary +----------+------------+---------+-----------+----------+-------+ Subclavian               Yes       Yes                      +----------+------------+---------+-----------+----------+-------+  Left Findings: +----------+------------+---------+-----------+----------+-------------+ LEFT      CompressiblePhasicitySpontaneousProperties   Summary    +----------+------------+---------+-----------+----------+-------------+ IJV  Full       Yes       Yes                            +----------+------------+---------+-----------+----------+-------------+ Subclavian               Yes       Yes                            +----------+------------+---------+-----------+----------+-------------+ Axillary                 Yes       Yes                            +----------+------------+---------+-----------+----------+-------------+ Brachial      Full       Yes       Yes                            +----------+------------+---------+-----------+----------+-------------+ Radial        Full                                                +----------+------------+---------+-----------+----------+-------------+ Ulnar         Full                                                 +----------+------------+---------+-----------+----------+-------------+ Cephalic      None                                  Acute forearm +----------+------------+---------+-----------+----------+-------------+ Basilic       Full                                                +----------+------------+---------+-----------+----------+-------------+  Summary:  Left: Findings consistent with acute superficial vein thrombosis involving the left cephalic vein.  Right: No evidence of deep vein thrombosis in the upper extremity.  *See table(s) above for measurements and observations.  Diagnosing physician: Monica Martinez MD Electronically signed by Monica Martinez MD on 01/14/2022 at 4:26:48 PM.    Final    DG Swallowing Func-Speech Pathology  Result Date: 01/14/2022 Table formatting from the original result was not included. Objective Swallowing Evaluation: Type of Study: MBS-Modified Barium Swallow Study  Patient Details Name: HARRIETTA INCORVAIA MRN: [id number] Date of Birth: [birth year] Today's Date: 01/14/2022 Time: SLP Start Time (ACUTE ONLY): 1445 -SLP Stop Time (ACUTE ONLY): 1505 SLP Time Calculation (min) (ACUTE ONLY): 20 min Past Medical History: Past Medical History: Diagnosis Date  AAA (abdominal aortic aneurysm) (HCC)   Anginal pain ([place])   Anxiety   Arthritis   Automatic implantable cardioverter-defibrillator in situ   Biventricular ICD  [place] [facility]   COPD (chronic obstructive pulmonary disease) ([place])   Deafness   left ear  Dementia (HCC)   Depression   Dizziness  Dysrhythmia   Fracture   history of spinal fracture  GERD (gastroesophageal reflux disease)   HFrEF (heart failure with reduced ejection fraction) ([place])   a. 2010 Cath: nonobs dzs, EF 15-20%; b. 09/2017 Echo: EF suboptimal. EF low nl; c. 04/2018 Echo: EF 40-45%.  Hypertension   Hypothyroidism   Non-ischemic cardiomyopathy ([place])   a. 2010 Cath: nonobs dzs, EF 15-20%; b. 2011 s/p SJM CRT-D; c. 09/2016 s/p Gen change; d. 09/2017 Echo: EF  suboptimal. EF low nl; e. 04/2018 Echo: EF 40-45%.  Oxygen dependent   Palpitations   Shortness of breath dyspnea   Sleep apnea   Wheezing  Past Surgical History: Past Surgical History: Procedure Laterality Date  ABDOMINAL HYSTERECTOMY    BIV ICD GENERATOR CHANGEOUT N/A 09/17/2016  Procedure: BiV ICD Generator Changeout;  Surgeon: Deboraha Sprang, MD;  Location: [facility];  Service: Cardiovascular;  Laterality: N/A;  BLADDER SURGERY    CARDIAC CATHETERIZATION    CARDIAC DEFIBRILLATOR PLACEMENT  4 yrs ago  pacemaker  CATARACT EXTRACTION W/PHACO Left 09/01/2014  Procedure: CATARACT EXTRACTION PHACO AND INTRAOCULAR LENS PLACEMENT ([place]);  Surgeon: Lyla Glassing, MD;  Location: [facility];  Service: Ophthalmology;  Laterality: Left;  [place]: 1:12.1   CATARACT EXTRACTION W/PHACO Right 09/29/2014  Procedure: CATARACT EXTRACTION PHACO AND INTRAOCULAR LENS PLACEMENT (I[facility]);  Surgeon: Lyla Glassing, MD;  Location: [facility];  Service: Ophthalmology;  Laterality: Right;  [place]: 00:52.7   CHOLECYSTECTOMY    COLON SURGERY    COLONOSCOPY    COLONOSCOPY N/A 08/31/2012  Procedure: COLONOSCOPY;  Surgeon: Inda Castle, MD;  Location: [facility];  Service: Endoscopy;  Laterality: N/A;  ESOPHAGOGASTRODUODENOSCOPY N/A 07/06/2012  Procedure: ESOPHAGOGASTRODUODENOSCOPY (EGD);  Surgeon: Lafayette Dragon, MD;  Location: [facility];  Service: Endoscopy;  Laterality: N/A;  INTRAMEDULLARY (IM) NAIL INTERTROCHANTERIC Right 10/23/2021  Procedure: INTRAMEDULLARY (IM) NAIL INTERTROCHANTERIC;  Surgeon: Corky Mull, MD;  Location: [facility];  Service: Orthopedics;  Laterality: Right;  KNEE SURGERY Right   MIDDLE EAR SURGERY    SAVORY DILATION N/A 07/06/2012  Procedure: SAVORY DILATION;  Surgeon: Lafayette Dragon, MD;  Location: [facility];  Service: Endoscopy;  Laterality: N/A;  TEE WITHOUT CARDIOVERSION N/A 06/23/2020  Procedure: TRANSESOPHAGEAL ECHOCARDIOGRAM (TEE);  Surgeon: Minna Merritts, MD;  Location: [facility];  Service: Cardiovascular;   Laterality: N/A;  WRIST SURGERY Right  HPI: Pt is a 71 year old female who presented with seizure-like activity. ETT 12/5-12/6 (self-extubated). Pt passed [place] swallow screen on 12/7 and diet initiated. EEGs 12/6, 12/7:  severe encephalopathy. PMH: Dementia, essential hypertension, sleep apnea, diabetes mellitus type 2, chronic systolic congestive heart failure, CVA, spinal fx, AICD, COPD 3L at baseline, L ear deafness, R hip IM nail 10/2021 (per husband continues to be NWB RLE). Seen by SLP at [facility] May, 2022 dysphagia 3 and thin liquids rx.  Subjective: awake, alert, fidgeting and moving around constantly  Recommendations for follow up therapy are one component of a multi-disciplinary discharge planning process, led by the attending physician.  Recommendations may be updated based on patient status, additional functional criteria and insurance authorization. Assessment / Plan / Recommendation   01/14/2022   3:14 PM Clinical Impressions Clinical Impression Patient is presenting with a mild-moderate oropharyngeal dysphagia with suspected significant impact from cognition. Patient was constantly moving around in chair in radiology suite, tilting head back then forward, regardless of presence of PO's. She required moderate cues to encourage her to swallow but was able to take puree by spoon, liquids by cup and straw.  With puree solids and nectar thick liquids she exhibited swallow initiation delays to vallecular sinus but with thin liquids she exhibited swallow initiaiton delay to the pyriform sinus. One instance of silent deep penetration to the vocal cords which did not clear was observed with large cup sip of thin liquids but no other instances even of penetration, were observed. Patient exhibited trace residuals in vallecular and pyriform sinus with nectar and thin liquids, and mild vallecular residuals with puree solids. Patient will be at a high aspiration risk secondary to her cognition and inability to sit still  or adequately attend to and manage PO's. SLP recommending downgrade solids to Dys 1 (puree) , continue with nectar thick liquids. SLP will continue to follow for diet toleration and patient's ability to upgrade with both solids and liquids. SLP Visit Diagnosis Dysphagia, oropharyngeal phase (R13.12) Impact on safety and function Moderate aspiration risk;Risk for inadequate nutrition/hydration     01/14/2022   3:14 PM Treatment Recommendations Treatment Recommendations Therapy as outlined in treatment plan below     01/14/2022   3:21 PM Prognosis Prognosis for Safe Diet Advancement Good Barriers to Reach Goals Cognitive deficits   01/14/2022   3:14 PM Diet Recommendations SLP Diet Recommendations Nectar thick liquid;Dysphagia 1 (Puree) solids Liquid Administration via Cup;Straw Medication Administration Crushed with puree Compensations Minimize environmental distractions;Slow rate;Small sips/bites Postural Changes Seated upright at 90 degrees     01/14/2022   3:14 PM Other Recommendations Oral Care Recommendations Oral care BID;Staff/trained caregiver to provide oral care Other Recommendations Have oral suction available Follow Up Recommendations Skilled nursing-short term rehab (<3 hours/day) Functional Status Assessment Patient has had a recent decline in their functional status and demonstrates the ability to make significant improvements in function in a reasonable and predictable amount of time.   01/14/2022   3:14 PM Frequency and Duration  Speech Therapy Frequency (ACUTE ONLY) min 2x/week Treatment Duration 2 weeks     01/14/2022   3:11 PM Oral Phase Oral Phase Impaired Oral - Nectar Straw Weak lingual manipulation;Reduced posterior propulsion;Decreased bolus cohesion;Lingual/palatal residue Oral - Thin Cup Lingual/palatal residue;Reduced posterior propulsion;Weak lingual manipulation;Decreased bolus cohesion Oral - Thin Straw Reduced posterior propulsion;Lingual/palatal residue;Decreased bolus cohesion;Weak  lingual manipulation Oral - Puree Reduced posterior propulsion;Delayed oral transit;Weak lingual manipulation;Lingual/palatal residue;Piecemeal swallowing    01/14/2022   3:12 PM Pharyngeal Phase Pharyngeal Phase Impaired Pharyngeal- Nectar Straw Delayed swallow initiation-vallecula;Pharyngeal residue - valleculae;Pharyngeal residue - posterior pharnyx;Pharyngeal residue - pyriform Pharyngeal- Thin Cup Delayed swallow initiation-pyriform sinuses;Delayed swallow initiation-vallecula;Pharyngeal residue - valleculae;Pharyngeal residue - posterior pharnyx Pharyngeal- Thin Straw Delayed swallow initiation-pyriform sinuses;Penetration/Aspiration during swallow;Trace aspiration;Pharyngeal residue - valleculae Pharyngeal Material enters airway, CONTACTS cords and not ejected out Pharyngeal- Puree Delayed swallow initiation-vallecula;Pharyngeal residue - valleculae    01/14/2022   3:14 PM Cervical Esophageal Phase  Cervical Esophageal Phase Impaired Cervical Esophageal Comment presence of suspected, small cricopharyngeal bar Sonia Baller, MA, CCC-SLP Speech Therapy                     DG CHEST PORT 1 VIEW  Result Date: 01/13/2022 CLINICAL DATA:  [id number] Hypoxia [id number] EXAM: PORTABLE CHEST 1 VIEW COMPARISON:  01/12/2022 FINDINGS: Cardiac silhouette is prominent. There is pulmonary interstitial prominence with vascular congestion. No focal consolidation. No pneumothorax identified. There might be small layering effusions. Aorta is calcified. There is a left-sided pacemaker. IMPRESSION: Findings suggest CHF. Electronically Signed   By: Sammie Bench M.D.   On: 01/13/2022 10:44       LOS:  7 days   Eboni Coval [employer] Pager on [email or web address]  01/15/2022, 10:01 AM

## 2022-01-15 NOTE — Progress Notes (Signed)
E [employer] notified that patient is not drinking and has had no urine out put

## 2022-01-15 NOTE — TOC Transition Note (Signed)
Transition of Care [facility]) - CM/SW Discharge Note   Patient Details  Name: Stacy Grimes MRN: [id number] Date of Birth: 1950/12/12  Transition of Care [facility]) CM/SW Contact:  Pollie Friar, RN Phone Number: 01/15/2022, 11:16 AM   Clinical Narrative:    Pt asleep so CM reached out to her spouse via phone. Spouse states his plan is to bring her home when she is medically ready.  She has most of the DME she may need. CM inquired about a hospital bed and spouse feels they dont need that currently. She has a tempur-pedic bed with rails. They are active with [facility] for: RN/aide/PT/OT per spouse. He manages her medications and provides needed transportation. They have a ramp also at the home.  Spouse would like her d/c medications to come home with her when she discharges from [place].  Currently the spouse feels ambulance transport would be best when medically ready. TOC following.     Barriers to Discharge: Continued Medical Work up   Patient Goals and CMS Choice   CMS Medicare.gov Compare Post Acute Care list provided to:: Patient Represenative (must comment) Choice offered to / list presented to : Spouse  Discharge Placement                       Discharge Plan and Services   Discharge Planning Services: CM Consult Post Acute Care Choice: Home Health                    HH Arranged: PT, OT, RN, Nurse's Aide [facility] Agency: [facility] Date [facility] Agency Contacted: 01/15/22   Representative spoke with at [place]: [place] ([place]) Interventions     Readmission Risk Interventions     No data to display

## 2022-01-15 NOTE — Plan of Care (Signed)

## 2022-01-15 NOTE — Progress Notes (Signed)
Nutrition Follow-up  DOCUMENTATION CODES:  Non-severe (moderate) malnutrition in context of chronic illness  INTERVENTION:  Continue current diet as ordered Nursing to assist with feeding Automatic trays MVI with minerals daily Magic cup TID with meals, each supplement provides 290 kcal and 9 grams of protein Recommend GOC be addressed with family to determine if aggressive nutrition interventions are desired (cortrak, PEG, etc)  NUTRITION DIAGNOSIS:  Moderate Malnutrition (in the context of chronic illness) related to poor appetite as evidenced by mild fat depletion, moderate muscle depletion. - remains applicable  GOAL:  Patient will meet greater than or equal to 90% of their needs - not progressing, poor PO, adding nutrition supplements  MONITOR:  PO intake, Supplement acceptance, Labs  REASON FOR ASSESSMENT:  Consult Assessment of nutrition requirement/status  ASSESSMENT:  Pt is a 71yo F with PMH of OSA, HTN, pacemaker, COPD, T2DM, CVA, CHF, AAA, hypothyroidism, GERD, arthritis, recent femur fracture and dementia who presents s/p witnessed seizure.   12/5 - intubated, admitted to ICU 12/6 - self-extubated 12/11 - MBS, diet downgraded to DYS 1, nectar thick  Pt resting in bed at the time of visit. Did not acknowledge presence. Pt restless in bed, bilateral mittens on. Untouched lunch tray at bedside. Pt appears much more confused and deconditioned than at last assessment when pt was in ICU.   Noted pt has not had a BM this admission. No scheduled regimen in place.  Discussed poor PO and BM with MD. Pt's family would benefit from talking about long term goals to determine if aggressive care and enteral feeds are desired. Noted a hx of dementia and overall poor health. PEG tubes have not been shown to improve or extend life in pt's with dementia.   Nutritionally Relevant Medications: Scheduled Meds:  atorvastatin  80 mg Oral Daily   cyanocobalamin  1,000 mcg Oral Daily    digoxin  0.125 mg Oral Q M,W,F   [START ON 01/16/2022] furosemide  40 mg Intravenous Daily   insulin aspart  0-15 Units Subcutaneous TID AC & HS   memantine  10 mg Oral BID   mirtazapine  7.5 mg Oral QHS   multivitamin with minerals  1 tablet Oral Daily   pantoprazole  40 mg Oral Daily   potassium chloride  20 mEq Oral Once   PRN Meds: docusate, polyethylene glycol  Labs Reviewed K 3.1 Creatinine 1.17 CBG ranges from 89-128 mg/dL over the last 24 hours  NUTRITION - FOCUSED PHYSICAL EXAM: Flowsheet Row Most Recent Value  Orbital Region Moderate depletion  Upper Arm Region Mild depletion  Thoracic and Lumbar Region No depletion  Buccal Region Moderate depletion  Temple Region Moderate depletion  Clavicle Bone Region Mild depletion  Clavicle and Acromion Bone Region Mild depletion  Scapular Bone Region No depletion  Dorsal Hand Moderate depletion  Patellar Region Moderate depletion  Anterior Thigh Region Moderate depletion  Posterior Calf Region Severe depletion  Edema (RD Assessment) None  Hair Reviewed  Eyes Reviewed  Mouth Reviewed  Skin Reviewed  Nails Reviewed    Diet Order:   Diet Order             DIET - DYS 1 Room service appropriate? No; Fluid consistency: Nectar Thick  Diet effective now                   EDUCATION NEEDS:  Not appropriate for education at this time  Skin:  Skin Assessment: Reviewed RN Assessment  Last BM:  unknown, PTA  Height:  Ht Readings from Last 1 Encounters:  01/08/22 5\' 3"  (1.6 m)    Weight:  Wt Readings from Last 1 Encounters:  01/14/22 71.7 kg    Ideal Body Weight:  52.3 kg  BMI:  Body mass index is 28 kg/m.  Estimated Nutritional Needs:  Kcal:  1700-1900kcal Protein:  90-115g Fluid:  1700-1974mL   Stacy Grimes, RD, LDN Clinical Dietitian RD pager # available in [facility]  After hours/weekend pager # available in [facility]

## 2022-01-15 NOTE — Progress Notes (Signed)
Speech Language Pathology Treatment: Dysphagia  Patient Details Name: Stacy Grimes MRN: [id number] DOB: 1950-05-30 Today's Date: 01/15/2022 Time: 2334-3568 SLP Time Calculation (min) (ACUTE ONLY): 11 min  Assessment / Plan / Recommendation Clinical Impression  Pt's mentation is more altered for SLP than it was on previous date, although RN reports that pt was able to swallow her pills in puree well this morning and had done well with limited intake of breakfast per night shift. With SLP she is restless, constantly moving in the bed, and not following commands. NT present upon SLP arrival and she says that this is more consistent with what she has seen this morning. Attempted offering boluses from current diet in a variety of ways (straw, spoon, siphoned via straw, self-feeding, etc.) but without any success in getting boluses into her oral cavity. Suspect that pt's swallowing function is fluctuating greatly as her mentation does. When she has her moments of being relatively more alert and accepting of POs, would offer then. If she is more altered and not actively accepting boluses, would hold. Careful supervision and staff monitoring will be important. SLP will continue to follow.    HPI HPI: Pt is a 71 year old female who presented with seizure-like activity. ETT 12/5-12/6 (self-extubated). Pt passed [facility] swallow screen on 12/7 and diet initiated. EEGs 12/6, 12/7:  severe encephalopathy. PMH: Dementia, essential hypertension, sleep apnea, diabetes mellitus type 2, chronic systolic congestive heart failure, CVA, spinal fx, AICD, COPD 3L at baseline, L ear deafness, R hip IM nail 10/2021 (per husband continues to be NWB RLE). Seen by SLP at [facility] May, 2022 dysphagia 3 and thin liquids rx.      SLP Plan  Continue with current plan of care      Recommendations for follow up therapy are one component of a multi-disciplinary discharge planning process, led by the attending physician.  Recommendations  may be updated based on patient status, additional functional criteria and insurance authorization.    Recommendations  Diet recommendations: Dysphagia 1 (puree);Nectar-thick liquid Liquids provided via: Cup;Straw Medication Administration: Crushed with puree Supervision: Full supervision/cueing for compensatory strategies;Staff to assist with self feeding Compensations: Minimize environmental distractions;Slow rate;Small sips/bites Postural Changes and/or Swallow Maneuvers: Seated upright 90 degrees                Oral Care Recommendations: Oral care BID Follow Up Recommendations: Skilled nursing-short term rehab (<3 hours/day) Assistance recommended at discharge: None SLP Visit Diagnosis: Dysphagia, oropharyngeal phase (R13.12) Plan: Continue with current plan of care           Osie Bond., M.A. [place] Office [phone]  Secure chat preferred   01/15/2022, 11:51 AM

## 2022-01-16 ENCOUNTER — Inpatient Hospital Stay (HOSPITAL_COMMUNITY): Payer: Medicare HMO

## 2022-01-16 DIAGNOSIS — R569 Unspecified convulsions: Secondary | ICD-10-CM | POA: Diagnosis not present

## 2022-01-16 DIAGNOSIS — Z7189 Other specified counseling: Secondary | ICD-10-CM

## 2022-01-16 DIAGNOSIS — Z515 Encounter for palliative care: Secondary | ICD-10-CM

## 2022-01-16 LAB — COMPREHENSIVE METABOLIC PANEL
ALT: 13 U/L (ref 0–44)
AST: 19 U/L (ref 15–41)
Albumin: 2.1 g/dL — ABNORMAL LOW (ref 3.5–5.0)
Alkaline Phosphatase: 137 U/L — ABNORMAL HIGH (ref 38–126)
Anion gap: 14 (ref 5–15)
BUN: 20 mg/dL (ref 8–23)
CO2: 39 mmol/L — ABNORMAL HIGH (ref 22–32)
Calcium: 8.3 mg/dL — ABNORMAL LOW (ref 8.9–10.3)
Chloride: 89 mmol/L — ABNORMAL LOW (ref 98–111)
Creatinine, Ser: 1.17 mg/dL — ABNORMAL HIGH (ref 0.44–1.00)
GFR, Estimated: 50 mL/min — ABNORMAL LOW (ref >60)
Glucose, Bld: 92 mg/dL (ref 70–99)
Potassium: 3.9 mmol/L (ref 3.5–5.1)
Sodium: 142 mmol/L (ref 135–145)
Total Bilirubin: 1.2 mg/dL (ref 0.3–1.2)
Total Protein: 6.7 g/dL (ref 6.5–8.1)

## 2022-01-16 LAB — CBC
HCT: 31.9 % — ABNORMAL LOW (ref 36.0–46.0)
Hemoglobin: 10 g/dL — ABNORMAL LOW (ref 12.0–15.0)
MCH: 26.2 pg (ref 26.0–34.0)
MCHC: 31.3 g/dL (ref 30.0–36.0)
MCV: 83.5 fL (ref 80.0–100.0)
Platelets: 249 10*3/uL (ref 150–400)
RBC: 3.82 MIL/uL — ABNORMAL LOW (ref 3.87–5.11)
RDW: 20.1 % — ABNORMAL HIGH (ref 11.5–15.5)
WBC: 13.1 10*3/uL — ABNORMAL HIGH (ref 4.0–10.5)
nRBC: 0 % (ref 0.0–0.2)

## 2022-01-16 LAB — GLUCOSE, CAPILLARY
Glucose-Capillary: 94 mg/dL (ref 70–99)
Glucose-Capillary: 88 mg/dL (ref 70–99)
Glucose-Capillary: 135 mg/dL — ABNORMAL HIGH (ref 70–99)
Glucose-Capillary: 91 mg/dL (ref 70–99)

## 2022-01-16 LAB — AMMONIA: Ammonia: 16 umol/L (ref 9–35)

## 2022-01-16 MED ORDER — NYSTATIN 100000 UNIT/ML MT SUSP
5.0000 mL | Freq: Four times a day (QID) | OROMUCOSAL | Status: DC
  Administered 2022-01-16 – 2022-01-17 (×4): 500000 [IU] via ORAL
  Filled 2022-01-16 (×4): qty 5

## 2022-01-16 MED ORDER — POLYVINYL ALCOHOL 1.4 % OP SOLN
1.0000 [drp] | OPHTHALMIC | Status: DC | PRN
  Filled 2022-01-16: qty 15

## 2022-01-16 NOTE — Consult Note (Addendum)
Palliative Medicine Inpatient Consult Note  Consulting Provider: Dr. Maryland Pink  Reason for consult:   [place] Palliative Medicine Consult  Reason for Consult? [place]   01/16/2022  HPI:  Per intake H&P --> This is a 71 year old female with multiple comorbidities including dementia, essential hypertension, sleep apnea, diabetes mellitus type 2, chronic systolic congestive heart failure who presented with seizure Activity. Required ventilator support and was in the ICU. She was stabilized and then transferred to the floor.   Palliative care has been asked to get involved to further discuss goals of care in the setting of multiple chronic comorbid conditions and poor functional state.  Clinical Assessment/Goals of Care:  *Please note that this is a verbal dictation therefore any spelling or grammatical errors are due to the "S[employer] One" system interpretation.  I have reviewed medical records including EPIC notes, labs and imaging, received report from bedside RN, assessed the patient who is lying in bed moves her arms and legs can answer name, who she is, her date of birth though does not have good insight as to where she is or why she is here.   I met with patient's spouse Karis Rilling to further discuss diagnosis prognosis, GOC, EOL wishes, disposition and options.   I introduced Palliative Medicine as specialized medical care for people living with serious illness. It focuses on providing relief from the symptoms and stress of a serious illness. The goal is to improve quality of life for both the patient and the family.  Medical History Review and Understanding:  A review of patient's history inclusive for diabetes, congestive heart failure, what has been describes as mild dementia, CVA, and hypertension was had.  Social History:  Jenissa lives with her spouse in a single-family home in [place].  They have been married for 35 years and share  5 children 1 of whom is deceased.  She is not presently working due to her disabled state.  She is a woman of faith and practices within the [facility] [employer] denomination.  Functional and Nutritional State:  Prior to hospitalization Kajah was bedbound per her spouse.  She could get out of bed and stand up with her left leg though was not able to put any pressure on her right leg due to knee arthritis and the possibility of falling.  Baker Janus helps her with bathing, dressing and all B ADLs with the exception of eating and drinking.  Patient did have a robust appetite prior to admission.  Advance Directives:  A detailed discussion was had today regarding advanced directives.  Patient's spouse [name] is her Ambulance person.  Code Status:  Concepts specific to code status, artifical feeding and hydration, continued IV antibiotics and rehospitalization was had.  The difference between a aggressive medical intervention path  and a palliative comfort care path for this patient at this time was had.   [name] and I reviewed the idea of resuscitation versus a DO NOT RESUSCITATE order.  [name] expresses that he is trained in resuscitation and he feels that there is "always a chance".  He realizes the possibility of rib fractures, pneumothorax, hemothorax, declining functional state though despite this he shares that his wife would very much like the opportunity to fight.  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  [name] shares that Nature has a pacemaker and if her heart were to decrease in rate he believes she would get the "shock  of the life time".  We talked more about if the patient would desire long-term life supportive measures and [name] does not believe she would if she were in a state of vegetation.  In regards to tracheostomy and gastrostomy tube it would be situation dependent per [name] as to whether or not these interventions would be pursued.  Discussion:  [name]  is convinced that Tamrah's present symptoms are in the setting of medications that she has received for seizures.  Feels like she is experiencing paroxysmal movements due to medicine.  He shares that the neurology team is going to see her going to make some medications adjustments.  [name] vocalizes that this is the best he seen her.  Goals at this time are for [place] to be medically optimized and back to baseline then to transition home where he will remain her primary caregiver  Discussed the importance of continued conversation with family and their  medical providers regarding overall plan of care and treatment options, ensuring decisions are within the context of the patients values and GOCs.  Decision Maker: Herbie Drape (Spouse): [phone] (Mobile)   SUMMARY OF RECOMMENDATIONS   Full Code/Full Scope of Care --> patient's spouse does not believe she would ever want to live in a prolonged state of vegetation though he cannot determine if he would ever desire tracheostomy or gastrostomy tube until he were in that situation  Continue current care  Goals are for improvement and to transition home  Ongoing palliative care support  Code Status/Advance Care Planning: FULL CODE   Symptom Management:  Oral Thrush: - Have alerted the primary team - Would benefit from diflucan in addition to oral nystatin - Diligent mouth care  Palliative Prophylaxis:  Aspiration, Bowel Regimen, Delirium Protocol, Frequent Pain Assessment, Oral Care, Palliative Wound Care, and Turn Reposition  Additional Recommendations (Limitations, Scope, Preferences): Continue current care  Psycho-social/Spiritual:  Desire for further Chaplaincy support: No, not at this time. Additional Recommendations: Education on chronic disease burden.   Prognosis: Recurrent re-hospitalizations, multiple chronic disease processes, decline in health since stroke. Increased 12 month mortality risk.   Discharge Planning:  Discharge to home with [facility] once medically optimized.  Vitals:   01/16/22 0835 01/16/22 1123  BP: (!) 122/92 115/74  Pulse: 93 100  Resp: 18 18  Temp: 97.9 F (36.6 C) 98.4 F (36.9 C)  SpO2: 95% 92%    Intake/Output Summary (Last 24 hours) at 01/16/2022 1253 Last data filed at 01/15/2022 2334 Gross per 24 hour  Intake 80 ml  Output 200 ml  Net -120 ml   Last Weight  Most recent update: 01/14/2022  4:14 AM    Weight  71.7 kg (158 lb 1.1 oz)            Gen:  Elderly Caucasian F in NAD HEENT: oral thrush, dry mucous membranes CV: Regular rate and rhythm  PULM:  On 3LPM HFNC, breathing is even and non-labored ABD: soft/nontender  EXT: No edema  Neuro: Alert to self  PPS: 20%   This conversation/these recommendations were discussed with patient primary care team, Dr. Avon Gully  Billing based on MDM: High  Problems Addressed: One acute or chronic illness or injury that poses a threat to life or bodily function  Amount and/or Complexity of Data: Category 3:Discussion of management or test interpretation with external physician/other qualified health care professional/appropriate source (not separately reported)  Risks: Decision regarding hospitalization or escalation of hospital care and Decision not to resuscitate or to de-escalate care because  of poor prognosis ______________________________________________________ [place] Palliative Medicine Team Team Cell Phone: [phone] Please utilize secure chat with additional questions, if there is no response within 30 minutes please call the above phone number  Palliative Medicine Team providers are available by phone from 7am to 7pm daily and can be reached through the team cell phone.  Should this patient require assistance outside of these hours, please call the patient's attending physician.

## 2022-01-16 NOTE — Progress Notes (Signed)
[facility] PROGRESS NOTE   Stacy Grimes KGU:[id number] DOB: 1950/06/02 DOA: 01/08/2022  PCP: Cyndi Bender, PA-C  Brief History/Interval Summary: This is a 71 year old female with multiple comorbidities including dementia, essential hypertension, sleep apnea, diabetes mellitus type 2, chronic systolic congestive heart failure who presented with seizure Activity.  Required ventilator support and was in the ICU.  She was stabilized and then transferred to the floor.  Consultants: Critical care medicine.  Neurology  Procedures: EEG  Subjective/Interval History: Patient mentioned that she is feeling better.  However patient is quite distracted.  Does not answer all questions appropriately.    Assessment/Plan:  Status epilepticus - Neurology following, Keppra ongoing  - MRI canceled due to incompatible AICD - Patient somewhat more encephalopathic over the past 24 hours, neurology has been requested to follow up on her mental status changes -Mental status appears to be somewhat improving over the past 12 hours but minimally, questionably polypharmacy as such patient's gabapentin, clonazepam has been decreased and her venlafaxine has been held.  Certainly Keppra could be playing a role as well. -Ammonia within normal limits, digoxin level minimally depressed  Acute on chronic respiratory failure with hypoxia Initially intubated for airway protection and self extubated in the ICU. Pulmonary edema improving with Lasix Wean oxygen as tolerated back to home 3 L nasal cannula Diagnosed with COVID-19 infection back in November, not currently infectious.  Acute on Chronic systolic CHF status post AICD/history of AAA/essential hypertension Echo from September showed EF to be 40 to 45%.  EF was 20-25% in 2022 There was concern for wide-complex tachycardia.  Seen by EP/cards. Device was interrogated -no further workup warranted. She has been continued on her metoprolol and digoxin.   Losartan is currently on hold.   Chest x-ray showed pulmonary edema.  Patient was started on IV diuretics.  She has diuresed well - wean as tolerated. Repeat X-ray improved. Continue metoprolol digoxin. Digoxin level remains low but heart rate remains well controlled. Defer to cardiology for further adjustment as needed.  Hypokalemia Likely due to diuretics. WNL    Superficial vein thrombosis left cephalic vein Noted to have left arm swelling. Likely due to IV infiltration. SVT noted on Doppler study.  No DVT.  Warm compresses.     History of previous stroke and dementia Baseline mental status unclear. She is noted to be on donepezil and memantine which is being continued.  Continue statin and Plavix.  Hypothyroidism Continue levothyroxine.  Diabetes mellitus type 2 Holding metformin. Continue SSI. Glucose readings are moderately well-controlled.  History of depression Noted to be on venlafaxine and mirtazapine.  Normocytic anemia No evidence of overt bleeding.  Monitor hemoglobin periodically.  Obesity Estimated body mass index is 28 kg/m as calculated from the following:   Height as of this encounter: 5\' 3"  (1.6 m).   Weight as of this encounter: 71.7 kg.  Moderate protein calorie malnutrition Nutrition Problem: Moderate Malnutrition (in the context of chronic illness) Etiology: poor appetite Poor oral intake noted over the last 24 hours.  Considering her dementia and numerous other medical problems it may not be unreasonable to involve palliative care for goals of care conversation.  DVT Prophylaxis: On Lovenox Code Status: Full code Family Communication: Family at bedside: Husband.   Disposition Plan: Seen by PT and OT.  CIR is recommended but not thought to be a good candidate for CIR.  Apparently husband has been has been declining SNF.  Status is: Inpatient Remains inpatient appropriate because: Seizures, acute respiratory  failure with hypoxia      Medications:  Scheduled:  arformoterol  15 mcg Nebulization BID   atorvastatin  80 mg Oral Daily   budesonide (PULMICORT) nebulizer solution  0.5 mg Nebulization BID   Chlorhexidine Gluconate Cloth  6 each Topical Daily   clonazepam  0.25 mg Oral BID   clopidogrel  75 mg Oral Daily   cyanocobalamin  1,000 mcg Oral Daily   digoxin  0.125 mg Oral Q M,W,F   donepezil  10 mg Oral QHS   enoxaparin (LOVENOX) injection  40 mg Subcutaneous Q24H   furosemide  40 mg Intravenous Daily   gabapentin  300 mg Oral QHS   insulin aspart  0-15 Units Subcutaneous TID AC & HS   levETIRAcetam  1,000 mg Oral BID   levothyroxine  112 mcg Oral Q0600   memantine  10 mg Oral BID   metoprolol succinate  25 mg Oral Daily   mirtazapine  7.5 mg Oral QHS   multivitamin with minerals  1 tablet Oral Daily   mouth rinse  15 mL Mouth Rinse 4 times per day   pantoprazole  40 mg Oral Daily   polyethylene glycol  17 g Oral Daily   sodium chloride flush  3 mL Intravenous Once   [START ON 01/17/2022] venlafaxine XR  75 mg Oral BID   Continuous:  KGY:[id number], albuterol, docusate, LORazepam, metoprolol tartrate, mouth rinse  Antibiotics: Anti-infectives (From admission, onward)    None       Objective:  Vital Signs  Vitals:   01/15/22 2159 01/15/22 2305 01/15/22 2333 01/16/22 0337  BP:  (!) 112/52 (!) 115/53 (!) 122/55  Pulse:  92 91 90  Resp:   17 17  Temp:   98.4 F (36.9 C) 98 F (36.7 C)  TempSrc:   Axillary Axillary  SpO2: 99% 96% 100% 98%  Weight:      Height:        Intake/Output Summary (Last 24 hours) at 01/16/2022 0723 Last data filed at 01/15/2022 2334 Gross per 24 hour  Intake 80 ml  Output 600 ml  Net -520 ml    Filed Weights   01/11/22 0444 01/13/22 0500 01/14/22 0413  Weight: 76.9 kg 72.9 kg 71.7 kg    General appearance: Awake alert.  In no distress.  Distracted.  Restless. Resp: Improved effort.  Fewer crackles compared to last few days.  No wheezing or rhonchi. Cardio: S1-S2 is  normal regular.  No S3-S4.  No rubs murmurs or bruit GI: Abdomen is soft.  Nontender nondistended.  Bowel sounds are present normal.  No masses organomegaly Extremities: Left arm swelling is noted. Moving all 4 extremities.  Lab Results:  Data Reviewed: I have personally reviewed following labs and reports of the imaging studies  CBC: Recent Labs  Lab 01/10/22 0240 01/11/22 0809 01/12/22 0302 01/13/22 0258 01/16/22 0355  WBC 7.2 7.7 7.6 10.9* 13.1*  HGB 8.8* 8.9* 8.9* 10.6* 10.0*  HCT 30.0* 29.2* 30.3* 34.6* 31.9*  MCV 89.6 87.7 88.6 86.9 83.5  PLT 239 250 222 196 249     Basic Metabolic Panel: Recent Labs  Lab 01/12/22 0302 01/13/22 0258 01/14/22 0412 01/14/22 0643 01/15/22 0244 01/16/22 0355  NA 144 144 143  --  143 142  K 3.5 4.1 2.8*  --  3.1* 3.9  CL 105 96* 90*  --  91* 89*  CO2 30 37* 38*  --  38* 39*  GLUCOSE 83 85 88  --  85 92  BUN <5* 6* 6*  --  14 20  CREATININE 0.88 0.84 1.02*  --  1.17* 1.17*  CALCIUM 8.1* 8.1* 8.0*  --  8.2* 8.3*  MG  --  1.8  --  1.7 2.4  --      GFR: Estimated Creatinine Clearance: 41.2 mL/min (A) (by C-G formula based on SCr of 1.17 mg/dL (H)).    CBG: Recent Labs  Lab 01/15/22 0558 01/15/22 1134 01/15/22 1633 01/15/22 2117 01/16/22 0604  GLUCAP 103* 125* 107* 109* 94      Recent Results (from the past 240 hour(s))  Blood Culture (routine x 2)     Status: None   Collection Time: 01/08/22  8:53 AM   Specimen: BLOOD RIGHT HAND  Result Value Ref Range Status   Specimen Description BLOOD RIGHT HAND  Final   Special Requests   Final    BOTTLES DRAWN AEROBIC AND ANAEROBIC Blood Culture results may not be optimal due to an inadequate volume of blood received in culture bottles   Culture   Final    NO GROWTH 5 DAYS Performed at [facility], [place] [address] [place], [place] [address]    Report Status 01/13/2022 FINAL  Final  Blood Culture (routine x 2)     Status: None   Collection Time: 01/08/22  8:58 AM    Specimen: BLOOD LEFT HAND  Result Value Ref Range Status   Specimen Description BLOOD LEFT HAND  Final   Special Requests   Final    BOTTLES DRAWN AEROBIC AND ANAEROBIC Blood Culture results may not be optimal due to an inadequate volume of blood received in culture bottles   Culture   Final    NO GROWTH 5 DAYS Performed at [facility], [place] [address] [place], [place] [address]    Report Status 01/13/2022 FINAL  Final  Urine Culture     Status: None   Collection Time: 01/08/22 11:33 AM   Specimen: In/Out Cath Urine  Result Value Ref Range Status   Specimen Description IN/OUT CATH URINE  Final   Special Requests NONE  Final   Culture   Final    NO GROWTH Performed at [facility], [place] [address] [place], [place] [address]    Report Status 01/09/2022 FINAL  Final  MRSA Next Gen by PCR, Nasal     Status: None   Collection Time: 01/08/22  1:37 PM   Specimen: Nasal Mucosa; Nasal Swab  Result Value Ref Range Status   MRSA by PCR Next Gen NOT DETECTED NOT DETECTED Final    Comment: (NOTE) The GeneXpert MRSA Assay (FDA approved for NASAL specimens only), is one component of a comprehensive MRSA colonization surveillance program. It is not intended to diagnose MRSA infection nor to guide or monitor treatment for MRSA infections. Test performance is not FDA approved in patients less than 65 years old. Performed at [facility], [place] [address] [place],  [address]       Radiology Studies: DG CHEST PORT 1 VIEW  Result Date: 01/15/2022 CLINICAL DATA:  Congestive heart failure. Shortness of breath and cough. EXAM: PORTABLE CHEST 1 VIEW COMPARISON:  AP chest 01/13/2022, 01/12/2022, 12/10/2021; CT chest 09/21/2009 FINDINGS: Left chest wall AICD with leads overlying the right atrium and right ventricle as well as the coronary sinus. Heart size is again mildly enlarged. Mediastinal contours are within normal limits. There is again cephalization of pulmonary  vasculature. No overt pulmonary edema. Bibasilar bronchovascular  crowding. No focal airspace opacity to indicate pneumonia. No pleural effusion pneumothorax. No acute skeletal abnormality. Mild to moderate left glenohumeral osteoarthritis. IMPRESSION: 1. Cardiomegaly with pulmonary vascular congestion. No overt pulmonary edema. 2. Bibasilar bronchovascular crowding. No focal airspace opacity to indicate pneumonia. Electronically Signed   By: Yvonne Kendall M.D.   On: 01/15/2022 08:24   VAS [place] UPPER EXTREMITY VENOUS DUPLEX  Result Date: 01/14/2022 UPPER VENOUS STUDY  Patient Name:  SHAIDA ROUTE [facility]  Date of Exam:   01/14/2022 Medical Rec #: [id number]         Accession #:    [id number] Date of Birth: May 27, 1950          Patient Gender: F Patient Age:   32 years Exam Location:  [facility] Procedure:      VAS [place] UPPER EXTREMITY VENOUS DUPLEX Referring Phys: Bonnielee Haff --------------------------------------------------------------------------------  Indications: Swelling, and IV infiltration Comparison Study: No prior study on file Performing Technologist: Sharion Dove RVS  Examination Guidelines: A complete evaluation includes B-mode imaging, spectral Doppler, color Doppler, and power Doppler as needed of all accessible portions of each vessel. Bilateral testing is considered an integral part of a complete examination. Limited examinations for reoccurring indications may be performed as noted.  Right Findings: +----------+------------+---------+-----------+----------+-------+ RIGHT     CompressiblePhasicitySpontaneousPropertiesSummary +----------+------------+---------+-----------+----------+-------+ Subclavian               Yes       Yes                      +----------+------------+---------+-----------+----------+-------+  Left Findings: +----------+------------+---------+-----------+----------+-------------+ LEFT      CompressiblePhasicitySpontaneousProperties   Summary     +----------+------------+---------+-----------+----------+-------------+ IJV           Full       Yes       Yes                            +----------+------------+---------+-----------+----------+-------------+ Subclavian               Yes       Yes                            +----------+------------+---------+-----------+----------+-------------+ Axillary                 Yes       Yes                            +----------+------------+---------+-----------+----------+-------------+ Brachial      Full       Yes       Yes                            +----------+------------+---------+-----------+----------+-------------+ Radial        Full                                                +----------+------------+---------+-----------+----------+-------------+ Ulnar         Full                                                +----------+------------+---------+-----------+----------+-------------+  Cephalic      None                                  Acute forearm +----------+------------+---------+-----------+----------+-------------+ Basilic       Full                                                +----------+------------+---------+-----------+----------+-------------+  Summary:  Left: Findings consistent with acute superficial vein thrombosis involving the left cephalic vein.  Right: No evidence of deep vein thrombosis in the upper extremity.  *See table(s) above for measurements and observations.  Diagnosing physician: Monica Martinez MD Electronically signed by Monica Martinez MD on 01/14/2022 at 4:26:48 PM.    Final    DG Swallowing Func-Speech Pathology  Result Date: 01/14/2022 Table formatting from the original result was not included. Objective Swallowing Evaluation: Type of Study: MBS-Modified Barium Swallow Study  Patient Details Name: ROLENA KNUTSON MRN: [id number] Date of Birth: 1950-06-05 Today's Date: 01/14/2022 Time: SLP Start Time (ACUTE  ONLY): 1445 -SLP Stop Time (ACUTE ONLY): 1505 SLP Time Calculation (min) (ACUTE ONLY): 20 min Past Medical History: Past Medical History: Diagnosis Date  AAA (abdominal aortic aneurysm) (HCC)   Anginal pain ([place])   Anxiety   Arthritis   Automatic implantable cardioverter-defibrillator in situ   Biventricular ICD  [place] [facility]   COPD (chronic obstructive pulmonary disease) ([place])   Deafness   left ear  Dementia ([place])   Depression   Dizziness   Dysrhythmia   Fracture   history of spinal fracture  GERD (gastroesophageal reflux disease)   HFrEF (heart failure with reduced ejection fraction) ([place])   a. 2010 Cath: nonobs dzs, EF 15-20%; b. 09/2017 Echo: EF suboptimal. EF low nl; c. 04/2018 Echo: EF 40-45%.  Hypertension   Hypothyroidism   Non-ischemic cardiomyopathy ([place])   a. 2010 Cath: nonobs dzs, EF 15-20%; b. 2011 s/p SJM CRT-D; c. 09/2016 s/p Gen change; d. 09/2017 Echo: EF suboptimal. EF low nl; e. 04/2018 Echo: EF 40-45%.  Oxygen dependent   Palpitations   Shortness of breath dyspnea   Sleep apnea   Wheezing  Past Surgical History: Past Surgical History: Procedure Laterality Date  ABDOMINAL HYSTERECTOMY    BIV ICD GENERATOR CHANGEOUT N/A 09/17/2016  Procedure: BiV ICD Generator Changeout;  Surgeon: Deboraha Sprang, MD;  Location: [facility];  Service: Cardiovascular;  Laterality: N/A;  BLADDER SURGERY    CARDIAC CATHETERIZATION    CARDIAC DEFIBRILLATOR PLACEMENT  4 yrs ago  pacemaker  CATARACT EXTRACTION W/PHACO Left 09/01/2014  Procedure: CATARACT EXTRACTION PHACO AND INTRAOCULAR LENS PLACEMENT ([place]);  Surgeon: Lyla Glassing, MD;  Location: [facility];  Service: Ophthalmology;  Laterality: Left;  [place]: 1:12.1   CATARACT EXTRACTION W/PHACO Right 09/29/2014  Procedure: CATARACT EXTRACTION PHACO AND INTRAOCULAR LENS PLACEMENT (IOC);  Surgeon: Lyla Glassing, MD;  Location: [facility];  Service: Ophthalmology;  Laterality: Right;  [place]: 00:52.7   CHOLECYSTECTOMY    COLON SURGERY    COLONOSCOPY    COLONOSCOPY N/A 08/31/2012   Procedure: COLONOSCOPY;  Surgeon: Inda Castle, MD;  Location: [facility];  Service: Endoscopy;  Laterality: N/A;  ESOPHAGOGASTRODUODENOSCOPY N/A 07/06/2012  Procedure: ESOPHAGOGASTRODUODENOSCOPY (EGD);  Surgeon: Lafayette Dragon, MD;  Location: [facility];  Service: Endoscopy;  Laterality: N/A;  INTRAMEDULLARY (IM) NAIL INTERTROCHANTERIC Right  10/23/2021  Procedure: INTRAMEDULLARY (IM) NAIL INTERTROCHANTERIC;  Surgeon: Corky Mull, MD;  Location: [facility];  Service: Orthopedics;  Laterality: Right;  KNEE SURGERY Right   MIDDLE EAR SURGERY    SAVORY DILATION N/A 07/06/2012  Procedure: SAVORY DILATION;  Surgeon: Lafayette Dragon, MD;  Location: [facility];  Service: Endoscopy;  Laterality: N/A;  TEE WITHOUT CARDIOVERSION N/A 06/23/2020  Procedure: TRANSESOPHAGEAL ECHOCARDIOGRAM (TEE);  Surgeon: Minna Merritts, MD;  Location: [facility];  Service: Cardiovascular;  Laterality: N/A;  WRIST SURGERY Right  HPI: Pt is a 71 year old female who presented with seizure-like activity. ETT 12/5-12/6 (self-extubated). Pt passed [facility] swallow screen on 12/7 and diet initiated. EEGs 12/6, 12/7:  severe encephalopathy. PMH: Dementia, essential hypertension, sleep apnea, diabetes mellitus type 2, chronic systolic congestive heart failure, CVA, spinal fx, AICD, COPD 3L at baseline, L ear deafness, R hip IM nail 10/2021 (per husband continues to be NWB RLE). Seen by SLP at [facility] May, 2022 dysphagia 3 and thin liquids rx.  Subjective: awake, alert, fidgeting and moving around constantly  Recommendations for follow up therapy are one component of a multi-disciplinary discharge planning process, led by the attending physician.  Recommendations may be updated based on patient status, additional functional criteria and insurance authorization. Assessment / Plan / Recommendation   01/14/2022   3:14 PM Clinical Impressions Clinical Impression Patient is presenting with a mild-moderate oropharyngeal dysphagia with suspected significant impact  from cognition. Patient was constantly moving around in chair in radiology suite, tilting head back then forward, regardless of presence of PO's. She required moderate cues to encourage her to swallow but was able to take puree by spoon, liquids by cup and straw. With puree solids and nectar thick liquids she exhibited swallow initiation delays to vallecular sinus but with thin liquids she exhibited swallow initiaiton delay to the pyriform sinus. One instance of silent deep penetration to the vocal cords which did not clear was observed with large cup sip of thin liquids but no other instances even of penetration, were observed. Patient exhibited trace residuals in vallecular and pyriform sinus with nectar and thin liquids, and mild vallecular residuals with puree solids. Patient will be at a high aspiration risk secondary to her cognition and inability to sit still or adequately attend to and manage PO's. SLP recommending downgrade solids to Dys 1 (puree) , continue with nectar thick liquids. SLP will continue to follow for diet toleration and patient's ability to upgrade with both solids and liquids. SLP Visit Diagnosis Dysphagia, oropharyngeal phase (R13.12) Impact on safety and function Moderate aspiration risk;Risk for inadequate nutrition/hydration     01/14/2022   3:14 PM Treatment Recommendations Treatment Recommendations Therapy as outlined in treatment plan below     01/14/2022   3:21 PM Prognosis Prognosis for Safe Diet Advancement Good Barriers to Reach Goals Cognitive deficits   01/14/2022   3:14 PM Diet Recommendations SLP Diet Recommendations Nectar thick liquid;Dysphagia 1 (Puree) solids Liquid Administration via Cup;Straw Medication Administration Crushed with puree Compensations Minimize environmental distractions;Slow rate;Small sips/bites Postural Changes Seated upright at 90 degrees     01/14/2022   3:14 PM Other Recommendations Oral Care Recommendations Oral care BID;Staff/trained caregiver to  provide oral care Other Recommendations Have oral suction available Follow Up Recommendations Skilled nursing-short term rehab (<3 hours/day) Functional Status Assessment Patient has had a recent decline in their functional status and demonstrates the ability to make significant improvements in function in a reasonable and predictable amount of time.   01/14/2022  3:14 PM Frequency and Duration  Speech Therapy Frequency (ACUTE ONLY) min 2x/week Treatment Duration 2 weeks     01/14/2022   3:11 PM Oral Phase Oral Phase Impaired Oral - Nectar Straw Weak lingual manipulation;Reduced posterior propulsion;Decreased bolus cohesion;Lingual/palatal residue Oral - Thin Cup Lingual/palatal residue;Reduced posterior propulsion;Weak lingual manipulation;Decreased bolus cohesion Oral - Thin Straw Reduced posterior propulsion;Lingual/palatal residue;Decreased bolus cohesion;Weak lingual manipulation Oral - Puree Reduced posterior propulsion;Delayed oral transit;Weak lingual manipulation;Lingual/palatal residue;Piecemeal swallowing    01/14/2022   3:12 PM Pharyngeal Phase Pharyngeal Phase Impaired Pharyngeal- Nectar Straw Delayed swallow initiation-vallecula;Pharyngeal residue - valleculae;Pharyngeal residue - posterior pharnyx;Pharyngeal residue - pyriform Pharyngeal- Thin Cup Delayed swallow initiation-pyriform sinuses;Delayed swallow initiation-vallecula;Pharyngeal residue - valleculae;Pharyngeal residue - posterior pharnyx Pharyngeal- Thin Straw Delayed swallow initiation-pyriform sinuses;Penetration/Aspiration during swallow;Trace aspiration;Pharyngeal residue - valleculae Pharyngeal Material enters airway, CONTACTS cords and not ejected out Pharyngeal- Puree Delayed swallow initiation-vallecula;Pharyngeal residue - valleculae    01/14/2022   3:14 PM Cervical Esophageal Phase  Cervical Esophageal Phase Impaired Cervical Esophageal Comment presence of suspected, small cricopharyngeal bar Sonia Baller, MA, CCC-SLP Speech  Therapy                         LOS: 8 days   Little Ishikawa  [facility] Pager on [email or web address]  01/16/2022, 7:23 AM

## 2022-01-16 NOTE — Plan of Care (Signed)

## 2022-01-16 NOTE — Progress Notes (Signed)
Neurology Progress Note  Brief HPI: 71 year old female with a history of dementia, COPD, CAD, cardiomyopathy, CHF, sleep apnea, hypothyroidism, hypertension, anxiety, recent right femur fracture with repair and osteopenia who presents after having a seizure with right gaze deviation on 01/08/2022.  She was discharged from the hospital last month after a repair of her right femur which was fractured after a fall.  She has been bedbound lately and at baseline she is alert and oriented to self only.  Her husband found her in bed shaking with right gaze deviation.  Per EMS she had an additional seizure while en route to the hospital and was given a dose of Versed.  She was intubated in the ED for respiratory distress and hypoxia.  She did have an episode of wide-complex tachycardia that resolved after fluids. Extubated 01/09/2022.  Patient has been transferred to the floor, and neurology was reconsulted for continued altered mental status and drowsiness.  Objective:   Exam: Vitals:   01/16/22 0736 01/16/22 0835  BP:  (!) 122/92  Pulse:  93  Resp:  18  Temp:  97.9 F (36.6 C)  SpO2: 100% 95%   Gen: In bed, NAD Resp: 4L [place]  Neuro: Mental Status: Awake, alert, eyes open, able to state name, states she is at home, able to follow one-step but not two-step commands Cranial Nerves: II: Pupils 40mm and reactive III,IV, VI: EOMI, able to track examiner VII: face symmetrical VIII: Hearing is intact to voice XI: head is midline XII: tongue midline Motor: Tone is normal. Bulk is normal.  Moves all extremities antigravity Sensory: Intact to light touch throughout Gait: Deferred for safety   Pertinent Labs:    Latest Ref Rng & Units 01/16/2022    3:55 AM 01/13/2022    2:58 AM 01/12/2022    3:02 AM  CBC  WBC 4.0 - 10.5 K/uL 13.1  10.9  7.6   Hemoglobin 12.0 - 15.0 g/dL 10.0  10.6  8.9   Hematocrit 36.0 - 46.0 % 31.9  34.6  30.3   Platelets 150 - 400 K/uL 249  196  222        Latest Ref Rng  & Units 01/16/2022    3:55 AM 01/15/2022    2:44 AM 01/14/2022    4:12 AM  BMP  Glucose 70 - 99 mg/dL 92  85  88   BUN 8 - 23 mg/dL 20  14  6    Creatinine 0.44 - 1.00 mg/dL 1.17  1.17  1.02   Sodium 135 - 145 mmol/L 142  143  143   Potassium 3.5 - 5.1 mmol/L 3.9  3.1  2.8   Chloride 98 - 111 mmol/L 89  91  90   CO2 22 - 32 mmol/L 39  38  38   Calcium 8.9 - 10.3 mg/dL 8.3  8.2  8.0      Imaging Reviewed: cEEG 1318 01/08/22 to 0730 01/09/22 - This was was an abnormal continuous video EEG due to diffuse background slowing and absent waking rhythms, indicative of a severe encephalopathy pattern. No seizures or epileptiform discharges were seen.    cEEG 0730 01/09/22 to 0730 01/10/22- This was was an abnormal continuous video EEG due to diffuse background slowing with some triphasic morphology, indicative of a severe toxic/metabolic encephalopathy pattern. No seizures or epileptiform discharges were seen.   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  Unable to obtain MRI due to PPM incompatibility.  Neurology was reconsulted on Tuesday for continued drowsiness and altered mental status.   - At baseline, patient is alert and oriented to self only.  She remains with this mental status today, and mental status appears to be at her baseline as noted previously during this admission.  She does not appear to be overly sedated today.  She appears alert at this time, although cognitively impaired.   - Mental status is likely fluctuating due to hospital induced delirium. Keppra is not  likely to be the cause of her altered mental status.  However, it is worthwhile to obtain a spot EEG to rule out possible subclinical seizure activity.  Impression:  Continued delirium and altered mental status in a patient with admission for seizures  Recommendations: - Continue Keppra 1000 mg bid - PRN ativan - Routine EEG to rule out subclinical seizure activity - Outpatient Neurology follow up after discharge   Patient seen and examined by NP/APP Cortney E Carron Curie , MSN, AGACNP-BC [facility] See Amion for schedule and pager information 01/16/2022 8:48 AM   Addendum: EEG: This EEG was obtained while awake and drowsy and is abnormal due to intermittent mild diffuse slowing indicative of global cerebral dysfunction. Epileptiform abnormalities were not seen during this recording.   Electronically signed: Dr. Kerney Elbe

## 2022-01-16 NOTE — Progress Notes (Signed)
Physical Therapy Treatment Patient Details Name: Stacy Grimes MRN: [id number] DOB: [birth year] Today's Date: 01/16/2022   History of Present Illness 71 year old female who presented with seizure Activity. Intubated, self-extubated. PMH: Dementia, essential hypertension, sleep apnea, diabetes mellitus type 2, chronic systolic congestive heart failure, CVA, spinal fx, AICD, COPD 3L at baseline, L ear deafness, R hip IM nail 10/2021 (per husband continues to be NWB RLE)    PT Comments    Pt seen with PT to advance functional mobility with 2 sets of skilled hands. Pt with noted continued chorea type movements throughout session in all positions and during mobility. Pt unable to maintain sitting balance EOB without constant and consistent hands max-total assist of 2. Pt continues to require max-total assist +2 for all bed mobility. Pt placed in chair position at end of session. HR up to 123bpm during mobility, sustaining 114bpm seated EOB. Pt continues to benefit from skilled PT services to progress toward functional mobility goals.     Recommendations for follow up therapy are one component of a multi-disciplinary discharge planning process, led by the attending physician.  Recommendations may be updated based on patient status, additional functional criteria and insurance authorization.  Follow Up Recommendations  Acute inpatient rehab (3hours/day) (husband planning to take pt home, refusing SNF) Can patient physically be transported by private vehicle: No   Assistance Recommended at Discharge Frequent or constant Supervision/Assistance  Patient can return home with the following Two people to help with walking and/or transfers;Assistance with cooking/housework;Assistance with feeding;Direct supervision/assist for medications management;Direct supervision/assist for financial management;Assist for transportation;Help with stairs or ramp for entrance   Equipment Recommendations  Other (comment)  (hoyer lift)    Recommendations for Other Services       Precautions / Restrictions Precautions Precautions: Fall Restrictions Weight Bearing Restrictions: Yes RLE Weight Bearing: Non weight bearing Other Position/Activity Restrictions: per husband NWB     Mobility  Bed Mobility Overal bed mobility: Needs Assistance Bed Mobility: Supine to Sit, Sit to Supine     Supine to sit: Max assist, +2 for physical assistance, HOB elevated Sit to supine: Total assist, +2 for physical assistance, +2 for safety/equipment   General bed mobility comments: Patient with poor command following throughout, incremental to no assist provided from patient, and requiring assist to bring BLEs to floor and assistance at the trunk to sit up right. Max assist required to sit EOB, with poor activity tolerance, with continued chorea-esque movements, total A of 2 to return to supine    Transfers                   General transfer comment: unable to attempt due to cognition, chorea-esque movements for the duration off sitting EOB, and HR assessed at 125 and sustaining at 113 sitting EOB    Ambulation/Gait                   Stairs             Wheelchair Mobility    Modified Rankin (Stroke Patients Only)       Balance Overall balance assessment: Needs assistance Sitting-balance support: Bilateral upper extremity supported, Feet supported Sitting balance-Leahy Scale: Zero Sitting balance - Comments: chorea-esque movements throughout, unable to maintain sitting balance without max external assist                                    Cognition Arousal/Alertness:  Lethargic Behavior During Therapy: Restless, Agitated, Impulsive Overall Cognitive Status: Impaired/Different from baseline Area of Impairment: Orientation, Attention, Memory, Following commands, Safety/judgement, Awareness, Problem solving                 Orientation Level: Disoriented to, Place,  Time, Situation Current Attention Level: Focused Memory: Decreased recall of precautions, Decreased short-term memory Following Commands: Follows one step commands inconsistently Safety/Judgement: Decreased awareness of safety, Decreased awareness of deficits Awareness: Intellectual Problem Solving: Slow processing, Decreased initiation, Difficulty sequencing, Requires verbal cues, Requires tactile cues General Comments: Patient very difficult to understand, could get basic words like "water" out, however remains very disoriented with poor command following        Exercises General Exercises - Lower Extremity Long Arc Quad: AROM, Right, Left, 10 reps, Seated    General Comments General comments (skin integrity, edema, etc.): pt spouse present and supportive througout session      Pertinent Vitals/Pain Pain Assessment Pain Assessment: Faces Faces Pain Scale: Hurts even more Pain Location: does not specify Pain Descriptors / Indicators: Grimacing, Discomfort, Moaning Pain Intervention(s): Monitored during session, Limited activity within patient's tolerance    Home Living                          Prior Function            PT Goals (current goals can now be found in the care plan section) Acute Rehab PT Goals PT Goal Formulation: Patient unable to participate in goal setting Time For Goal Achievement: 01/26/22    Frequency    Min 3X/week      PT Plan Discharge plan needs to be updated    Co-evaluation PT/OT/SLP Co-Evaluation/Treatment: Yes Reason for Co-Treatment: Complexity of the patient's impairments (multi-system involvement);For patient/therapist safety PT goals addressed during session: Mobility/safety with mobility;Balance        AM-PAC PT "6 Clicks" Mobility   Outcome Measure  Help needed turning from your back to your side while in a flat bed without using bedrails?: A Lot Help needed moving from lying on your back to sitting on the side of  a flat bed without using bedrails?: Total Help needed moving to and from a bed to a chair (including a wheelchair)?: Total Help needed standing up from a chair using your arms (e.g., wheelchair or bedside chair)?: Total Help needed to walk in hospital room?: Total Help needed climbing 3-5 steps with a railing? : Total 6 Click Score: 7    End of Session   Activity Tolerance: Patient limited by lethargy Patient left: in bed;with call bell/phone within reach;with bed alarm set (in chair position) Nurse Communication: Mobility status PT Visit Diagnosis: Other abnormalities of gait and mobility (R26.89);Muscle weakness (generalized) (M62.81)     Time: 2841-3244 PT Time Calculation (min) (ACUTE ONLY): 26 min  Charges:  $Therapeutic Activity: 8-22 mins                    Rhema Boyett R. PTA Acute Rehabilitation Services Office: [place] 01/16/2022, 5:04 PM

## 2022-01-16 NOTE — Progress Notes (Signed)
EEG complete - results pending 

## 2022-01-16 NOTE — Progress Notes (Signed)
Occupational Therapy Treatment Patient Details Name: Stacy Grimes MRN: [id number] DOB: [birth year] Today's Date: 01/16/2022   History of present illness 71 year old female who presented with seizure Activity. Intubated, self-extubated. PMH: Dementia, essential hypertension, sleep apnea, diabetes mellitus type 2, chronic systolic congestive heart failure, CVA, spinal fx, AICD, COPD 3L at baseline, L ear deafness, R hip IM nail 10/2021 (per husband continues to be NWB RLE)   OT comments  Patient seen with PTA to advance progression with two sets of skilled hands. Patient with continued chorea-esque movements, unable to maintain balance sitting EOB, max A for ADLs, and max to total A of 2 to transition to EOB. Patient's husband present for session, is adamant on taking patient home, and declining need for hoyer or hospital bed stating, "The fire department usually helps me if I need to pick her up.". Patient also noted to have HR up to 125 seated EOB, however sustained at 114 sitting EOB. Patient placed in chair position at end of session. Recommendation upgraded to reflect family's wishes; OT will continue to follow.    Recommendations for follow up therapy are one component of a multi-disciplinary discharge planning process, led by the attending physician.  Recommendations may be updated based on patient status, additional functional criteria and insurance authorization.    Follow Up Recommendations  Home health OT     Assistance Recommended at Discharge Frequent or constant Supervision/Assistance (Needs 24/7 support)  Patient can return home with the following  Two people to help with walking and/or transfers;Two people to help with bathing/dressing/bathroom;Assistance with cooking/housework;Assistance with feeding;Direct supervision/assist for medications management;Direct supervision/assist for financial management;Assist for transportation;Help with stairs or ramp for entrance   Equipment  Recommendations  None recommended by OT (Husband declining hospital bed and hoyer)    Recommendations for Other Services      Precautions / Restrictions Precautions Precautions: Fall Restrictions Weight Bearing Restrictions: Yes RLE Weight Bearing: Non weight bearing Other Position/Activity Restrictions: per husband NWB       Mobility Bed Mobility Overal bed mobility: Needs Assistance Bed Mobility: Supine to Sit, Sit to Supine     Supine to sit: Max assist, +2 for physical assistance, HOB elevated Sit to supine: Total assist, +2 for physical assistance, +2 for safety/equipment   General bed mobility comments: Patient with poor command following throughout, incremental to no assist provided from patient, and requiring assist to bring BLEs to floor and assistance at the trunk to sit up right. Max assist required to sit EOB, with poor activity tolerance, with continued chorea-esque movements, total A of 2 to return to supine    Transfers                   General transfer comment: unable to attempt due to cognition, chorea-esque movements for the duration off sitting EOB, and HR assessed at 125 and sustaining at 113 sitting EOB     Balance Overall balance assessment: Needs assistance Sitting-balance support: Bilateral upper extremity supported, Feet supported Sitting balance-Leahy Scale: Zero Sitting balance - Comments: chorea-esque movements throughout, unable to maintain sitting balance without max external assist                                   ADL either performed or assessed with clinical judgement   ADL Overall ADL's : Needs assistance/impaired Eating/Feeding: Maximal assistance;Sitting;Bed level Eating/Feeding Details (indicate cue type and reason): sitting EOB and in chair position,  unable to bring cup to mouth despite mulitple attempts independently                     Toilet Transfer: Maximal assistance;Total assistance;+2 for  physical assistance;+2 for safety/equipment           Functional mobility during ADLs: Maximal assistance;+2 for physical assistance;+2 for safety/equipment;Cueing for sequencing;Cueing for safety General ADL Comments: Patient remais max to total A for all aspects of care    Extremity/Trunk Assessment Upper Extremity Assessment LUE Deficits / Details: unable to reach mouth with either UE due to weakness LUE Coordination: decreased fine motor;decreased gross motor       Cervical / Trunk Assessment Cervical / Trunk Assessment: Kyphotic    Vision       Perception     Praxis      Cognition Arousal/Alertness: Lethargic Behavior During Therapy: Restless, Agitated, Impulsive Overall Cognitive Status: Impaired/Different from baseline Area of Impairment: Orientation, Attention, Memory, Following commands, Safety/judgement, Awareness, Problem solving                 Orientation Level: Disoriented to, Place, Time, Situation Current Attention Level: Focused Memory: Decreased recall of precautions, Decreased short-term memory Following Commands: Follows one step commands inconsistently Safety/Judgement: Decreased awareness of safety, Decreased awareness of deficits Awareness: Intellectual Problem Solving: Slow processing, Decreased initiation, Difficulty sequencing, Requires verbal cues, Requires tactile cues General Comments: Patient very difficult to understand, could get basic words like "water" out, however remains very disoriented with poor command following        Exercises      Shoulder Instructions       General Comments      Pertinent Vitals/ Pain       Pain Assessment Pain Assessment: Faces Faces Pain Scale: Hurts even more Pain Location: does not specify Pain Descriptors / Indicators: Grimacing, Discomfort, Moaning Pain Intervention(s): Limited activity within patient's tolerance, Monitored during session, Repositioned  Home Living                                           Prior Functioning/Environment              Frequency  Min 2X/week        Progress Toward Goals  OT Goals(current goals can now be found in the care plan section)  Progress towards OT goals: Not progressing toward goals - comment (continued chorea movements, poor cognition)  Acute Rehab OT Goals Patient Stated Goal: for my wife to come home OT Goal Formulation: With family Time For Goal Achievement: 01/26/22 Potential to Achieve Goals: Poor  Plan Discharge plan needs to be updated    Co-evaluation                 AM-PAC OT "6 Clicks" Daily Activity     Outcome Measure   Help from another person eating meals?: A Lot Help from another person taking care of personal grooming?: A Lot Help from another person toileting, which includes using toliet, bedpan, or urinal?: Total Help from another person bathing (including washing, rinsing, drying)?: Total Help from another person to put on and taking off regular upper body clothing?: A Lot Help from another person to put on and taking off regular lower body clothing?: Total 6 Click Score: 9    End of Session Equipment Utilized During Treatment: Oxygen  OT Visit Diagnosis: Unsteadiness on  feet (R26.81);Other abnormalities of gait and mobility (R26.89);Muscle weakness (generalized) (M62.81);Other symptoms and signs involving the nervous system (R29.898);Other symptoms and signs involving cognitive function   Activity Tolerance Patient limited by lethargy;Patient limited by fatigue   Patient Left in bed;with call bell/phone within reach;with bed alarm set;Other (comment) (chair position)   Nurse Communication Mobility status;Precautions;Weight bearing status        Time: 1351-1417 OT Time Calculation (min): 26 min  Charges: OT General Charges $OT Visit: 1 Visit OT Treatments $Self Care/Home Management : 8-22 mins  Corinne Ports E. Ronya Gilcrest, OTR/L Acute Rehabilitation  Services [phone]   Ascencion Dike 01/16/2022, 4:07 PM

## 2022-01-16 NOTE — Progress Notes (Signed)
OT Cancellation Note  Patient Details Name: Stacy Grimes MRN: [id number] DOB: February 01, 1951   Cancelled Treatment:    Reason Eval/Treat Not Completed: Other (comment) Palliative present with spouse for meeting. OT will follow back as time permits.   Corinne Ports E. Breckin Zafar, OTR/L Acute Rehabilitation Services [phone]   Ascencion Dike 01/16/2022, 12:02 PM

## 2022-01-16 NOTE — Procedures (Signed)
Routine EEG Report  Stacy Grimes is a 71 y.o. female with a history of seizure who is undergoing an EEG to evaluate for seizures.  Report: This EEG was acquired with electrodes placed according to the International 10-20 electrode system (including Fp1, Fp2, F3, F4, C3, C4, P3, P4, O1, O2, T3, T4, T5, T6, A1, A2, Fz, Cz, Pz). The following electrodes were missing or displaced: none.  The occipital dominant rhythm was 8.5 Hz with intermittent diffuse slowing. This activity is reactive to stimulation. Drowsiness was manifested by background fragmentation; deeper stages of sleep were not identified. There was no focal slowing. There were no interictal epileptiform discharges. There were no electrographic seizures identified. Photic stimulation and hyperventilation were not performed.  Impression and clinical correlation: This EEG was obtained while awake and drowsy and is abnormal due to intermittent mild diffuse slowing indicative of global cerebral dysfunction. Epileptiform abnormalities were not seen during this recording.  Su Monks, MD [facility] [phone]  If 7pm- 7am, please page neurology on call as listed in [place].

## 2022-01-17 ENCOUNTER — Other Ambulatory Visit (HOSPITAL_COMMUNITY): Payer: Self-pay

## 2022-01-17 ENCOUNTER — Ambulatory Visit: Payer: Medicare HMO | Admitting: Cardiovascular Disease

## 2022-01-17 DIAGNOSIS — Z515 Encounter for palliative care: Secondary | ICD-10-CM | POA: Diagnosis not present

## 2022-01-17 DIAGNOSIS — R569 Unspecified convulsions: Secondary | ICD-10-CM | POA: Diagnosis not present

## 2022-01-17 DIAGNOSIS — Z7189 Other specified counseling: Secondary | ICD-10-CM | POA: Diagnosis not present

## 2022-01-17 LAB — GLUCOSE, CAPILLARY
Glucose-Capillary: 86 mg/dL (ref 70–99)
Glucose-Capillary: 186 mg/dL — ABNORMAL HIGH (ref 70–99)
Glucose-Capillary: 186 mg/dL — ABNORMAL HIGH (ref 70–99)

## 2022-01-17 MED ORDER — FLUCONAZOLE 200 MG PO TABS
200.0000 mg | ORAL_TABLET | Freq: Every day | ORAL | 0 refills | Status: AC
  Filled 2022-01-17: qty 2, 2d supply, fill #0
  Filled 2022-01-17: qty 70, 2d supply, fill #0

## 2022-01-17 MED ORDER — FLUCONAZOLE IN SODIUM CHLORIDE 200-0.9 MG/100ML-% IV SOLN
200.0000 mg | INTRAVENOUS | Status: DC
  Administered 2022-01-17: 200 mg via INTRAVENOUS
  Filled 2022-01-17: qty 100

## 2022-01-17 MED ORDER — LEVETIRACETAM 1000 MG PO TABS
1000.0000 mg | ORAL_TABLET | Freq: Two times a day (BID) | ORAL | 1 refills | Status: DC
  Filled 2022-01-17: qty 60, 30d supply, fill #0

## 2022-01-17 MED ORDER — ALLOPURINOL 100 MG PO TABS
100.0000 mg | ORAL_TABLET | Freq: Every day | ORAL | 0 refills | Status: DC
  Filled 2022-01-17: qty 30, 30d supply, fill #0

## 2022-01-17 MED ORDER — FUROSEMIDE 20 MG PO TABS
ORAL_TABLET | ORAL | 1 refills | Status: DC
  Filled 2022-01-17: qty 45, 60d supply, fill #0

## 2022-01-17 MED ORDER — NEPRO/CARBSTEADY PO LIQD
237.0000 mL | Freq: Two times a day (BID) | ORAL | Status: DC
  Administered 2022-01-17 (×2): 237 mL via ORAL

## 2022-01-17 NOTE — Care Management Important Message (Signed)
Important Message  Patient Details  Name: Stacy Grimes MRN: [id number] Date of Birth: [birth year]   Medicare Important Message Given:  Yes     Orbie Pyo 01/17/2022, 3:30 PM

## 2022-01-17 NOTE — TOC Transition Note (Signed)
Transition of Care [facility]) - CM/SW Discharge Note   Patient Details  Name: SHENIQUA CAROLAN MRN: [id number] Date of Birth: Jan 30, 1951  Transition of Care [facility]) CM/SW Contact:  Pollie Friar, RN Phone Number: 01/17/2022, 4:36 PM   Clinical Narrative:    Pt is discharging home with her spouse. Home health will resume at home with [facility]. Orders in the system and information on the AVS.  Spouse states they have all DME at home.  Spouse has decided to provide transport home. He will bring her oxygen for transport. \ Medications for home to be delivered to the room per [place].   Final next level of care: Home w Home Health Services Barriers to Discharge: No Barriers Identified   Patient Goals and CMS Choice   CMS Medicare.gov Compare Post Acute Care list provided to:: Patient Represenative (must comment) Choice offered to / list presented to : Spouse  Discharge Placement                       Discharge Plan and Services   Discharge Planning Services: CM Consult Post Acute Care Choice: Home Health                    HH Arranged: PT, OT, RN, Nurse's Aide [facility] Agency: [facility] Date [facility] Agency Contacted: 01/15/22   Representative spoke with at [place]: [place] ([place]) Interventions     Readmission Risk Interventions     No data to display

## 2022-01-17 NOTE — Progress Notes (Signed)
Nutrition Follow-up  DOCUMENTATION CODES:  Non-severe (moderate) malnutrition in context of chronic illness  INTERVENTION:  Continue current diet as ordered Nursing to assist with feeding Automatic trays MVI with minerals daily Magic cup TID with meals, each supplement provides 290 kcal and 9 grams of protein Nepro Shake po BID, each supplement provides 425 kcal and 19 grams protein and is nectar thick and appropriate for current diet order  NUTRITION DIAGNOSIS:  Moderate Malnutrition (in the context of chronic illness) related to poor appetite as evidenced by mild fat depletion, moderate muscle depletion. - remains applicable  GOAL:  Patient will meet greater than or equal to 90% of their needs - Progressing, diet and nutrition supplements in place  MONITOR:  PO intake, Supplement acceptance, Labs  REASON FOR ASSESSMENT:  Consult Assessment of nutrition requirement/status  ASSESSMENT:  Pt is a 71yo F with PMH of OSA, HTN, pacemaker, COPD, T2DM, CVA, CHF, AAA, hypothyroidism, GERD, arthritis, recent femur fracture and dementia who presents s/p witnessed seizure.   12/5 - intubated, admitted to ICU 12/6 - self-extubated 12/11 - MBS, diet downgraded to DYS 1, nectar thick  Pt resting in bed at the time of visit, much more alert today than at last assessment. Pt pulling her O2 off, helped her reposition. PMT met with pt's husband yesterday to discuss [place]. Family wants full scope of care, but cortrak not indicated at this time. Family would like to focus on PO feeds. Will add additional nutrition supplements to augment intake.   Nutritionally Relevant Medications: Scheduled Meds:  atorvastatin  80 mg Oral Daily   cyanocobalamin  1,000 mcg Oral Daily   digoxin  0.125 mg Oral Q M,W,F   NEPRO CARB STEADY  237 mL Oral BID BM   furosemide  40 mg Intravenous Daily   insulin aspart  0-15 Units Subcutaneous TID AC & HS   memantine  10 mg Oral BID   mirtazapine  7.5 mg Oral QHS    multivitamin with minerals  1 tablet Oral Daily   nystatin  5 mL Oral QID   pantoprazole  40 mg Oral Daily   polyethylene glycol  17 g Oral Daily   Labs Reviewed CBG ranges from 86-135 mg/dL over the last 24 hours  NUTRITION - FOCUSED PHYSICAL EXAM: Flowsheet Row Most Recent Value  Orbital Region Moderate depletion  Upper Arm Region Mild depletion  Thoracic and Lumbar Region No depletion  Buccal Region Moderate depletion  Temple Region Moderate depletion  Clavicle Bone Region Mild depletion  Clavicle and Acromion Bone Region Mild depletion  Scapular Bone Region No depletion  Dorsal Hand Moderate depletion  Patellar Region Moderate depletion  Anterior Thigh Region Moderate depletion  Posterior Calf Region Severe depletion  Edema (RD Assessment) None  Hair Reviewed  Eyes Reviewed  Mouth Reviewed  Skin Reviewed  Nails Reviewed    Diet Order:   Diet Order             DIET - DYS 1 Room service appropriate? No; Fluid consistency: Nectar Thick  Diet effective now                   EDUCATION NEEDS:  Not appropriate for education at this time  Skin:  Skin Assessment: Reviewed RN Assessment  Last BM:  12/13 - type 6  Height:  Ht Readings from Last 1 Encounters:  01/08/22 _0  (1.6 m)    Weight:  Wt Readings from Last 1 Encounters:  01/14/22 71.7 kg  Ideal Body Weight:  52.3 kg  BMI:  Body mass index is 28 kg/m.  Estimated Nutritional Needs:  Kcal:  1700-1900kcal Protein:  90-115g Fluid:  1700-1927m   RRanell Patrick RD, LDN Clinical Dietitian RD pager # available in [facility]  After hours/weekend pager # available in [facility]

## 2022-01-17 NOTE — Plan of Care (Signed)

## 2022-01-17 NOTE — Progress Notes (Signed)
Speech Language Pathology Treatment: Dysphagia  Patient Details Name: Stacy Grimes MRN: [id number] DOB: 11-02-1950 Today's Date: 01/17/2022 Time: 8592-9244 SLP Time Calculation (min) (ACUTE ONLY): 14 min  Assessment / Plan / Recommendation Clinical Impression  Pt had meal tray in front of her upon SLP arrival but was leaning heavily over to her L side. She was repositioned before being offered food/drink within current diet textures. Pt tries to decline any assistance offered for feeding but was only able to get POs when assisted by SLP. Oral phase is very prolonged with purees, exhibiting a single cough when offered a nectar thick liquids wash. She seemed to do better when offered a smaller spoonful. Note that pt still has foods that are more solid on her tray (brought in from outside source), for which she was reaching. SLP moved them further out of her reach and notified RN. SLP also updated sign at [facility]. Continue to recommend Dys 1 diet, nectar thick liquids, and full supervision during meal.   HPI HPI: Pt is a 71 year old female who presented with seizure-like activity. ETT 12/5-12/6 (self-extubated). Pt passed [facility] swallow screen on 12/7 and diet initiated. EEGs 12/6, 12/7:  severe encephalopathy. PMH: Dementia, essential hypertension, sleep apnea, diabetes mellitus type 2, chronic systolic congestive heart failure, CVA, spinal fx, AICD, COPD 3L at baseline, L ear deafness, R hip IM nail 10/2021 (per husband continues to be NWB RLE). Seen by SLP at [facility] May, 2022 dysphagia 3 and thin liquids rx.      SLP Plan  Continue with current plan of care      Recommendations for follow up therapy are one component of a multi-disciplinary discharge planning process, led by the attending physician.  Recommendations may be updated based on patient status, additional functional criteria and insurance authorization.    Recommendations  Diet recommendations: Dysphagia 1 (puree);Nectar-thick  liquid Liquids provided via: Cup;Straw Medication Administration: Crushed with puree Supervision: Full supervision/cueing for compensatory strategies;Staff to assist with self feeding Compensations: Minimize environmental distractions;Slow rate;Small sips/bites Postural Changes and/or Swallow Maneuvers: Seated upright 90 degrees                Oral Care Recommendations: Oral care BID Follow Up Recommendations: Skilled nursing-short term rehab (<3 hours/day) Assistance recommended at discharge: Frequent or constant Supervision/Assistance SLP Visit Diagnosis: Dysphagia, oropharyngeal phase (R13.12) Plan: Continue with current plan of care           Osie Bond., M.A. [place] Office [phone]  Secure chat preferred   01/17/2022, 10:16 AM

## 2022-01-17 NOTE — Discharge Summary (Addendum)
Physician Discharge Summary  Stacy Grimes KGM:[id number] DOB: Oct 02, 1950 DOA: 01/08/2022  PCP: Cyndi Bender, PA-C  Admit date: 01/08/2022 Discharge date: 01/17/2022  Admitted From: Home Disposition:  Home   Recommendations for Outpatient Follow-up:  Follow up with PCP in 1-2 weeks Follow up with cardiology as scheduled - repeat digoxin level in the next few weeks Follow up with neurology as scheduled   Home Health: Resume HHPT/OT per previous order Equipment/Devices:No new equipment  Discharge Condition: Stable CODE STATUS:Full  Diet recommendation: As tolerated puree with nectar thick liquids - advance back to regular diet as tolerated per ongoing workup and management with speech therapy.   Brief/Interim Summary: This is a 71 year old female with multiple comorbidities including dementia, essential hypertension, sleep apnea, diabetes mellitus type 2, chronic systolic congestive heart failure who presented with seizure Activity. Required ventilator support and was in the ICU. She was stabilized and then transferred to the floor.   Patient's mental status improved drastically over the past few days- back to baseline per husband. She is poorly ambulatory at baseline - PT/OT recommending discharge to rehab but patient/husband refuse and would like to be discharged home. As such will set up with HHPT/OT/RN/Aide to assist with transition back home and to continue recover and physical therapy.  Discharge Diagnoses:  Principal Problem:   Seizures ([place]) Active Problems:   Acute on chronic respiratory failure with hypoxia and hypercapnia (HCC)   Hypokalemia   Wide-complex tachycardia   Status epilepticus (HCC)   Malnutrition of moderate degree   Goals of care  -Disposition home as above - husband indicates that he will not have the patient discharged to a nursing facility - discharge home with HHPT/OT/RN/Aide to assist with transition home.   Status epilepticus, resolved Acute  metabolic encephalopathy, resolved Likely complicated by polypharmacy - Neurology following, Keppra ongoing - follow up in clinic as scheduled - MRI canceled due to incompatible AICD - Mental status improving after multiple medications were held - see med rec changes below   Acute on chronic respiratory failure with hypoxia, resolving - Initially intubated for airway protection and self extubated in the ICU. - Pulmonary edema improving with Lasix - Back on home 3 L nasal cannula - Diagnosed with COVID-19 infection back in November, not currently infectious/contagious, no need for quarantine or PPE.   Acute on Chronic systolic CHF status post AICD/history of AAA/essential hypertension Echo from September showed EF to be 40 to 45%.  EF was 20-25% in 2022 There was concern for wide-complex tachycardia.  Seen by EP/cards. Device was interrogated -no further workup warranted. She has been continued on her metoprolol and digoxin.  Losartan discontinued Chest x-ray showed pulmonary edema.  Patient was started on IV diuretics.  She has diuresed well - wean back to PO lasix. Repeat X-ray improved. Continue metoprolol digoxin. Digoxin level remains low but heart rate remains well controlled. Defer to cardiology for further adjustment as indicated.   Hypokalemia Likely due to diuretics. WNL     Superficial vein thrombosis left cephalic vein Noted to have left arm swelling. Likely due to IV infiltration. SVT noted on Doppler study.  No DVT.  Warm compresses as needed.     History of previous stroke and dementia Baseline mental status unclear. She is noted to be on donepezil and memantine which is being continued.  Continue statin and Plavix.   Hypothyroidism Continue levothyroxine.   Diabetes mellitus type 2 Resume metformin   History of depression Resume home medications   Normocytic anemia  No evidence of overt bleeding.  Monitor hemoglobin periodically. Likely chronic anemia of chronic  disease given above.   Obesity Estimated body mass index is 28 kg/m as calculated from the following:   Height as of this encounter: 5\' 3"  (1.6 m).   Weight as of this encounter: 71.7 kg.   Moderate protein calorie malnutrition Nutrition Problem: Moderate Malnutrition (in the context of chronic illness) Etiology: poor appetite  Discharge Instructions  Discharge Instructions     Ambulatory referral to Neurology   Complete by: As directed    An appointment is requested in approximately: 4-6 wks      Allergies as of 01/17/2022       Reactions   Codeine Palpitations   Contrast Media [iodinated Contrast Media] Rash        Medication List     STOP taking these medications    hydrOXYzine 25 MG capsule Commonly known as: VISTARIL   losartan 25 MG tablet Commonly known as: COZAAR       TAKE these medications    acetaminophen 650 MG CR tablet Commonly known as: TYLENOL Take 650 mg by mouth at bedtime.   albuterol 0.63 MG/3ML nebulizer solution Commonly known as: ACCUNEB Take 1 ampule by nebulization every 6 (six) hours as needed for wheezing. What changed: Another medication with the same name was changed. Make sure you understand how and when to take each.   albuterol 108 (90 Base) MCG/ACT inhaler Commonly known as: VENTOLIN HFA Inhale 2 puffs into the lungs every 6 (six) hours as needed. What changed: reasons to take this   allopurinol 100 MG tablet Commonly known as: ZYLOPRIM Take 1 tablet (100 mg total) by mouth daily.   atorvastatin 80 MG tablet Commonly known as: Lipitor Take 1 tablet (80 mg total) by mouth daily.   BENGAY EX Apply 1 application topically daily as needed (back pain).   budesonide-formoterol 160-4.5 MCG/ACT inhaler Commonly known as: SYMBICORT Inhale 2 puffs into the lungs 2 (two) times daily.   clopidogrel 75 MG tablet Commonly known as: PLAVIX Take 1 tablet (75 mg total) by mouth daily.   cyanocobalamin 1000 MCG  tablet Commonly known as: VITAMIN B12 Take 1,000 mcg by mouth 2 (two) times daily.   digoxin 0.125 MG tablet Commonly known as: LANOXIN TAKE 1 TABLET BY MOUTH ON MONDAY, WEDNESDAY AND FRIDAY What changed: See the new instructions.   donepezil 10 MG tablet Commonly known as: ARICEPT Take 10 mg by mouth at bedtime.   fluconazole 10 MG/ML suspension Commonly known as: DIFLUCAN Take 20 mLs (200 mg total) by mouth daily for 2 days.   furosemide 20 MG tablet Commonly known as: LASIX Take 1 tablet (20 mg total) by mouth every other day. May take an additional tablet AS NEEDED for weight gain of 3 lbs overnight or 5 lbs in one week. Do not exceed more than 3 additional doses. - Oral   gabapentin 300 MG capsule Commonly known as: NEURONTIN Take 300 mg by mouth 2 (two) times daily as needed (pain/restlessness).   levETIRAcetam 1000 MG tablet Commonly known as: KEPPRA Take 1 tablet (1,000 mg total) by mouth 2 (two) times daily.   levothyroxine 112 MCG tablet Commonly known as: SYNTHROID Take 100 mcg by mouth daily before breakfast.   memantine 10 MG tablet Commonly known as: NAMENDA Take 10 mg by mouth in the morning and at bedtime.   metoprolol succinate 25 MG 24 hr tablet Commonly known as: TOPROL-XL Take 25 mg by mouth  daily.   mirtazapine 7.5 MG tablet Commonly known as: REMERON Take 7.5 mg by mouth 2 (two) times daily.   omeprazole 20 MG capsule Commonly known as: PRILOSEC Take 1 capsule (20 mg total) by mouth every morning.   potassium chloride SA 20 MEQ tablet Commonly known as: KLOR-CON M Take 1 tablet (20 mEq total) by mouth daily.   tiotropium 18 MCG inhalation capsule Commonly known as: SPIRIVA Place 1 capsule (18 mcg total) into inhaler and inhale daily as needed (shortness of breath).   venlafaxine XR 75 MG 24 hr capsule Commonly known as: EFFEXOR-XR Take 75 mg by mouth 2 (two) times daily.        Allergies  Allergen Reactions   Codeine  Palpitations   Contrast Media [Iodinated Contrast Media] Rash    Consultations: Neuro, PCCM   Procedures/Studies: EEG adult  Result Date: 01-18-2022 Derek Jack, MD     01/18/2022  2:10 PM Routine EEG Report Stacy Grimes is a 71 y.o. female with a history of seizure who is undergoing an EEG to evaluate for seizures. Report: This EEG was acquired with electrodes placed according to the International 10-20 electrode system (including Fp1, Fp2, F3, F4, C3, C4, P3, P4, O1, O2, T3, T4, T5, T6, A1, A2, Fz, Cz, Pz). The following electrodes were missing or displaced: none. The occipital dominant rhythm was 8.5 Hz with intermittent diffuse slowing. This activity is reactive to stimulation. Drowsiness was manifested by background fragmentation; deeper stages of sleep were not identified. There was no focal slowing. There were no interictal epileptiform discharges. There were no electrographic seizures identified. Photic stimulation and hyperventilation were not performed. Impression and clinical correlation: This EEG was obtained while awake and drowsy and is abnormal due to intermittent mild diffuse slowing indicative of global cerebral dysfunction. Epileptiform abnormalities were not seen during this recording. Su Monks, MD [facility] [phone] If 7pm- 7am, please page neurology on call as listed in [place].   DG CHEST PORT 1 VIEW  Result Date: 01/15/2022 CLINICAL DATA:  Congestive heart failure. Shortness of breath and cough. EXAM: PORTABLE CHEST 1 VIEW COMPARISON:  AP chest 01/13/2022, 01/12/2022, 12/10/2021; CT chest 09/21/2009 FINDINGS: Left chest wall AICD with leads overlying the right atrium and right ventricle as well as the coronary sinus. Heart size is again mildly enlarged. Mediastinal contours are within normal limits. There is again cephalization of pulmonary vasculature. No overt pulmonary edema. Bibasilar bronchovascular crowding. No focal airspace opacity to  indicate pneumonia. No pleural effusion pneumothorax. No acute skeletal abnormality. Mild to moderate left glenohumeral osteoarthritis. IMPRESSION: 1. Cardiomegaly with pulmonary vascular congestion. No overt pulmonary edema. 2. Bibasilar bronchovascular crowding. No focal airspace opacity to indicate pneumonia. Electronically Signed   By: Yvonne Kendall M.D.   On: 01/15/2022 08:24   VAS [place] UPPER EXTREMITY VENOUS DUPLEX  Result Date: 01/14/2022 UPPER VENOUS STUDY  Patient Name:  Stacy Grimes [facility]  Date of Exam:   01/14/2022 Medical Rec #: [id number]         Accession #:    [id number] Date of Birth: 1950/12/18          Patient Gender: F Patient Age:   24 years Exam Location:  [facility] Procedure:      VAS [place] UPPER EXTREMITY VENOUS DUPLEX Referring Phys: Bonnielee Haff --------------------------------------------------------------------------------  Indications: Swelling, and IV infiltration Comparison Study: No prior study on file Performing Technologist: Sharion Dove RVS  Examination Guidelines: A complete evaluation includes B-mode imaging, spectral Doppler, color Doppler,  and power Doppler as needed of all accessible portions of each vessel. Bilateral testing is considered an integral part of a complete examination. Limited examinations for reoccurring indications may be performed as noted.  Right Findings: +----------+------------+---------+-----------+----------+-------+ RIGHT     CompressiblePhasicitySpontaneousPropertiesSummary +----------+------------+---------+-----------+----------+-------+ Subclavian               Yes       Yes                      +----------+------------+---------+-----------+----------+-------+  Left Findings: +----------+------------+---------+-----------+----------+-------------+ LEFT      CompressiblePhasicitySpontaneousProperties   Summary    +----------+------------+---------+-----------+----------+-------------+ IJV           Full       Yes        Yes                            +----------+------------+---------+-----------+----------+-------------+ Subclavian               Yes       Yes                            +----------+------------+---------+-----------+----------+-------------+ Axillary                 Yes       Yes                            +----------+------------+---------+-----------+----------+-------------+ Brachial      Full       Yes       Yes                            +----------+------------+---------+-----------+----------+-------------+ Radial        Full                                                +----------+------------+---------+-----------+----------+-------------+ Ulnar         Full                                                +----------+------------+---------+-----------+----------+-------------+ Cephalic      None                                  Acute forearm +----------+------------+---------+-----------+----------+-------------+ Basilic       Full                                                +----------+------------+---------+-----------+----------+-------------+  Summary:  Left: Findings consistent with acute superficial vein thrombosis involving the left cephalic vein.  Right: No evidence of deep vein thrombosis in the upper extremity.  *See table(s) above for measurements and observations.  Diagnosing physician: Monica Martinez MD Electronically signed by Monica Martinez MD on 01/14/2022 at 4:26:48 PM.    Final    DG Swallowing Func-Speech Pathology  Result Date: 01/14/2022 Table formatting from  the original result was not included. Objective Swallowing Evaluation: Type of Study: MBS-Modified Barium Swallow Study  Patient Details Name: Stacy Grimes MRN: [id number] Date of Birth: April 15, 1950 Today's Date: 01/14/2022 Time: SLP Start Time (ACUTE ONLY): 1445 -SLP Stop Time (ACUTE ONLY): 1505 SLP Time Calculation (min) (ACUTE ONLY): 20 min Past Medical  History: Past Medical History: Diagnosis Date  AAA (abdominal aortic aneurysm) (HCC)   Anginal pain ([place])   Anxiety   Arthritis   Automatic implantable cardioverter-defibrillator in situ   Biventricular ICD  [place]   COPD (chronic obstructive pulmonary disease) ([employer])   Deafness   left ear  Dementia ([place])   Depression   Dizziness   Dysrhythmia   Fracture   history of spinal fracture  GERD (gastroesophageal reflux disease)   HFrEF (heart failure with reduced ejection fraction) ([place])   a. 2010 Cath: nonobs dzs, EF 15-20%; b. 09/2017 Echo: EF suboptimal. EF low nl; c. 04/2018 Echo: EF 40-45%.  Hypertension   Hypothyroidism   Non-ischemic cardiomyopathy ([place])   a. 2010 Cath: nonobs dzs, EF 15-20%; b. 2011 s/p SJM CRT-D; c. 09/2016 s/p Gen change; d. 09/2017 Echo: EF suboptimal. EF low nl; e. 04/2018 Echo: EF 40-45%.  Oxygen dependent   Palpitations   Shortness of breath dyspnea   Sleep apnea   Wheezing  Past Surgical History: Past Surgical History: Procedure Laterality Date  ABDOMINAL HYSTERECTOMY    BIV ICD GENERATOR CHANGEOUT N/A 09/17/2016  Procedure: BiV ICD Generator Changeout;  Surgeon: Deboraha Sprang, MD;  Location: [place] [facility];  Service: Cardiovascular;  Laterality: N/A;  BLADDER SURGERY    CARDIAC CATHETERIZATION    CARDIAC DEFIBRILLATOR PLACEMENT  4 yrs ago  pacemaker  CATARACT EXTRACTION W/PHACO Left 09/01/2014  Procedure: CATARACT EXTRACTION PHACO AND INTRAOCULAR LENS PLACEMENT ([place]);  Surgeon: Lyla Glassing, MD;  Location: [facility];  Service: Ophthalmology;  Laterality: Left;  [place]: 1:12.1   CATARACT EXTRACTION W/PHACO Right 09/29/2014  Procedure: CATARACT EXTRACTION PHACO AND INTRAOCULAR LENS PLACEMENT ([facility]);  Surgeon: Lyla Glassing, MD;  Location: [facility];  Service: Ophthalmology;  Laterality: Right;  [place]: 00:52.7   CHOLECYSTECTOMY    COLON SURGERY    COLONOSCOPY    COLONOSCOPY N/A 08/31/2012  Procedure: COLONOSCOPY;  Surgeon: Inda Castle, MD;  Location: [facility];  Service: Endoscopy;   Laterality: N/A;  ESOPHAGOGASTRODUODENOSCOPY N/A 07/06/2012  Procedure: ESOPHAGOGASTRODUODENOSCOPY (EGD);  Surgeon: Lafayette Dragon, MD;  Location: [facility];  Service: Endoscopy;  Laterality: N/A;  INTRAMEDULLARY (IM) NAIL INTERTROCHANTERIC Right 10/23/2021  Procedure: INTRAMEDULLARY (IM) NAIL INTERTROCHANTERIC;  Surgeon: Corky Mull, MD;  Location: [facility];  Service: Orthopedics;  Laterality: Right;  KNEE SURGERY Right   MIDDLE EAR SURGERY    SAVORY DILATION N/A 07/06/2012  Procedure: SAVORY DILATION;  Surgeon: Lafayette Dragon, MD;  Location: [facility];  Service: Endoscopy;  Laterality: N/A;  TEE WITHOUT CARDIOVERSION N/A 06/23/2020  Procedure: TRANSESOPHAGEAL ECHOCARDIOGRAM (TEE);  Surgeon: Minna Merritts, MD;  Location: [facility];  Service: Cardiovascular;  Laterality: N/A;  WRIST SURGERY Right  HPI: Pt is a 71 year old female who presented with seizure-like activity. ETT 12/5-12/6 (self-extubated). Pt passed [place] swallow screen on 12/7 and diet initiated. EEGs 12/6, 12/7:  severe encephalopathy. PMH: Dementia, essential hypertension, sleep apnea, diabetes mellitus type 2, chronic systolic congestive heart failure, CVA, spinal fx, AICD, COPD 3L at baseline, L ear deafness, R hip IM nail 10/2021 (per husband continues to be NWB RLE). Seen by SLP at [facility] May, 2022 dysphagia 3 and thin  liquids rx.  Subjective: awake, alert, fidgeting and moving around constantly  Recommendations for follow up therapy are one component of a multi-disciplinary discharge planning process, led by the attending physician.  Recommendations may be updated based on patient status, additional functional criteria and insurance authorization. Assessment / Plan / Recommendation   01/14/2022   3:14 PM Clinical Impressions Clinical Impression Patient is presenting with a mild-moderate oropharyngeal dysphagia with suspected significant impact from cognition. Patient was constantly moving around in chair in radiology suite, tilting head back then  forward, regardless of presence of PO's. She required moderate cues to encourage her to swallow but was able to take puree by spoon, liquids by cup and straw. With puree solids and nectar thick liquids she exhibited swallow initiation delays to vallecular sinus but with thin liquids she exhibited swallow initiaiton delay to the pyriform sinus. One instance of silent deep penetration to the vocal cords which did not clear was observed with large cup sip of thin liquids but no other instances even of penetration, were observed. Patient exhibited trace residuals in vallecular and pyriform sinus with nectar and thin liquids, and mild vallecular residuals with puree solids. Patient will be at a high aspiration risk secondary to her cognition and inability to sit still or adequately attend to and manage PO's. SLP recommending downgrade solids to Dys 1 (puree) , continue with nectar thick liquids. SLP will continue to follow for diet toleration and patient's ability to upgrade with both solids and liquids. SLP Visit Diagnosis Dysphagia, oropharyngeal phase (R13.12) Impact on safety and function Moderate aspiration risk;Risk for inadequate nutrition/hydration     01/14/2022   3:14 PM Treatment Recommendations Treatment Recommendations Therapy as outlined in treatment plan below     01/14/2022   3:21 PM Prognosis Prognosis for Safe Diet Advancement Good Barriers to Reach Goals Cognitive deficits   01/14/2022   3:14 PM Diet Recommendations SLP Diet Recommendations Nectar thick liquid;Dysphagia 1 (Puree) solids Liquid Administration via Cup;Straw Medication Administration Crushed with puree Compensations Minimize environmental distractions;Slow rate;Small sips/bites Postural Changes Seated upright at 90 degrees     01/14/2022   3:14 PM Other Recommendations Oral Care Recommendations Oral care BID;Staff/trained caregiver to provide oral care Other Recommendations Have oral suction available Follow Up Recommendations Skilled  nursing-short term rehab (<3 hours/day) Functional Status Assessment Patient has had a recent decline in their functional status and demonstrates the ability to make significant improvements in function in a reasonable and predictable amount of time.   01/14/2022   3:14 PM Frequency and Duration  Speech Therapy Frequency (ACUTE ONLY) min 2x/week Treatment Duration 2 weeks     01/14/2022   3:11 PM Oral Phase Oral Phase Impaired Oral - Nectar Straw Weak lingual manipulation;Reduced posterior propulsion;Decreased bolus cohesion;Lingual/palatal residue Oral - Thin Cup Lingual/palatal residue;Reduced posterior propulsion;Weak lingual manipulation;Decreased bolus cohesion Oral - Thin Straw Reduced posterior propulsion;Lingual/palatal residue;Decreased bolus cohesion;Weak lingual manipulation Oral - Puree Reduced posterior propulsion;Delayed oral transit;Weak lingual manipulation;Lingual/palatal residue;Piecemeal swallowing    01/14/2022   3:12 PM Pharyngeal Phase Pharyngeal Phase Impaired Pharyngeal- Nectar Straw Delayed swallow initiation-vallecula;Pharyngeal residue - valleculae;Pharyngeal residue - posterior pharnyx;Pharyngeal residue - pyriform Pharyngeal- Thin Cup Delayed swallow initiation-pyriform sinuses;Delayed swallow initiation-vallecula;Pharyngeal residue - valleculae;Pharyngeal residue - posterior pharnyx Pharyngeal- Thin Straw Delayed swallow initiation-pyriform sinuses;Penetration/Aspiration during swallow;Trace aspiration;Pharyngeal residue - valleculae Pharyngeal Material enters airway, CONTACTS cords and not ejected out Pharyngeal- Puree Delayed swallow initiation-vallecula;Pharyngeal residue - valleculae    01/14/2022   3:14 PM Cervical Esophageal Phase  Cervical Esophageal Phase  Impaired Cervical Esophageal Comment presence of suspected, small cricopharyngeal bar Sonia Baller, MA, CCC-SLP Speech Therapy                     DG CHEST PORT 1 VIEW  Result Date: 01/13/2022 CLINICAL DATA:  200808  Hypoxia [id number] EXAM: PORTABLE CHEST 1 VIEW COMPARISON:  01/12/2022 FINDINGS: Cardiac silhouette is prominent. There is pulmonary interstitial prominence with vascular congestion. No focal consolidation. No pneumothorax identified. There might be small layering effusions. Aorta is calcified. There is a left-sided pacemaker. IMPRESSION: Findings suggest CHF. Electronically Signed   By: Sammie Bench M.D.   On: 01/13/2022 10:44   DG CHEST PORT 1 VIEW  Result Date: 01/12/2022 CLINICAL DATA:  Hypoxia.  Follow-up study. EXAM: PORTABLE CHEST 1 VIEW COMPARISON:  01/08/2022 and older exams. FINDINGS: Since the most recent prior exam, the endotracheal tube and nasal/orogastric tube have been removed. The left anterior chest wall biventricular cardioverter-defibrillator is stable. Lungs demonstrate bronchovascular interstitial prominence similar to the prior exam. Mild basilar atelectasis. No convincing edema or evidence of pneumonia. Suspect small effusions. No pneumothorax. IMPRESSION: 1. No acute findings. 2. Mild basilar atelectasis. Suspect small effusions. No convincing pulmonary edema and no evidence of pneumonia. Electronically Signed   By: Lajean Manes M.D.   On: 01/12/2022 09:40   Overnight EEG with video  Result Date: 01/09/2022 Samuella Cota, MD     01/09/2022  8:48 AM EEG Procedure CPT/Type of Study: 04540; 24hr EEG with video Referring Provider: Loanne Drilling Primary Neurological Diagnosis: AMS History: This is a 71 yr old patient, undergoing an EEG to evaluate for AMS. Clinical State: obtunded Technical Description: The EEG was performed using standard setting per the guidelines of American Clinical Neurophysiology Society (ACNS). A minimum of 21 electrodes were placed on scalp according to the International 10-20 or/and 10-10 Systems. Supplemental electrodes were placed as needed. Single EKG electrode was also used to detect cardiac arrhythmia. Patient's behavior was continuously recorded on video  simultaneously with EEG. A minimum of 16 channels were used for data display. Each epoch of study was reviewed manually daily and as needed using standard referential and bipolar montages. Computerized quantitative EEG analysis (such as compressed spectral array analysis, trending, automated spike & seizure detection) were used as indicated. Day 1: from 1318 01/08/22 to 0730 01/09/22 EEG Description: Overall Amplitude:Normal Predominant Frequency: The background activity showed delta/theta slowing occurring frequently. Superimposed Frequencies: sparse beta activity The background was symmetric Background Abnormalities: Generalized slowing, delta/theta range Rhythmic or periodic pattern: No Epileptiform activity: no Electrographic seizures: no Events: no Breach rhythm: no Reactivity: Present but without waking patterns Stimulation procedures: Hyperventilation: not done Photic stimulation: no change Sleep Background: Stage I EKG:no significant arrhythmia Impression: This was was an abnormal continuous video EEG due to diffuse background slowing and absent waking rhythms, indicative of a severe encephalopathy pattern. No seizures or epileptiform discharges were seen.   CT ANGIO HEAD NECK W WO CM W PERF (CODE STROKE)  Result Date: 01/08/2022 CLINICAL DATA:  71 year old female code stroke presentation also with seizure. Possible right MCA deficit clinically. EXAM: CT ANGIOGRAPHY HEAD AND NECK CT PERFUSION BRAIN TECHNIQUE: Multidetector CT imaging of the head and neck was performed using the standard protocol during bolus administration of intravenous contrast. Multiplanar CT image reconstructions and MIPs were obtained to evaluate the vascular anatomy. Carotid stenosis measurements (when applicable) are obtained utilizing NASCET criteria, using the distal internal carotid diameter as the denominator. Multiphase CT imaging of the brain was  performed following IV bolus contrast injection. Subsequent parametric perfusion  maps were calculated using RAPID software. RADIATION DOSE REDUCTION: This exam was performed according to the departmental dose-optimization program which includes automated exposure control, adjustment of the mA and/or kV according to patient size and/or use of iterative reconstruction technique. CONTRAST:  100 mL Omnipaque 350 COMPARISON:  Plain head CT today. Prior CTA head and neck 06/20/2020. FINDINGS: CT Brain Perfusion Findings: ASPECTS: 10 CBF (<30%) Volume: None.  No CBF or CBV parameters are abnormal. Perfusion (Tmax>6.0s) volume: None Mismatch Volume: Not applicable Infarction Location:Not applicable CTA NECK Skeleton: Advanced chronic cervical spine degeneration appears stable since last year. Chronically absent dentition. No acute osseous abnormality identified. Upper chest: Intubated. Enteric tube courses into the esophagus. Endotracheal tube tip is at the carina. Chronic left chest AICD. Centrilobular emphysema. Mild superimposed dependent atelectasis mostly in the right lung. Small volume retained secretions near the tip of the ET tube. Visible mediastinal lymph nodes are stable and within normal limits. Other neck: Intubated. Oral enteric tube in place. No acute finding. Aortic arch: Calcified aortic atherosclerosis. 3 vessel arch configuration. Right carotid system: Right CCA and proximal right ICA soft and calcified plaque is mild for age, stable since 2022 and there is no significant stenosis Left carotid system: Some of the proximal left CCA is obscured by dense AICD streak artifact. But similar to the right side left cervical carotid atherosclerosis is stable and mild. No significant stenosis identified. Vertebral arteries: Proximal right subclavian artery is patent with minimal plaque. Right vertebral artery origin remains normal. Right vertebral artery is patent and mildly tortuous to the skull base with no significant plaque or stenosis. Proximal left subclavian artery plaque is mild to  moderate, stable without significant stenosis. Left vertebral artery origin is normal. Fairly codominant left vertebral artery is patent to the skull base with no significant plaque or stenosis identified. CTA HEAD Posterior circulation: Mildly dominant right V4 segment. Distal vertebral arteries and vertebrobasilar junction are patent with no significant plaque or stenosis. Normal left PICA origin. As before right AICA may be dominant. Patent basilar artery, AICA origins, SCA and PCA origins with no significant stenosis. Posterior communicating arteries are diminutive or absent. Bilateral PCA branches are stable and within normal limits. Anterior circulation: Both ICA siphons are patent. Mild to moderate bilateral siphon calcified plaque. No significant stenosis on the left. On the right side there is a mild to moderate pre cavernous stenosis at the junction of the petrous and cavernous segments on series 13, image 124 which is not significantly changed. Patent carotid termini, MCA and ACA origins. Normal anterior communicating artery. Bilateral ACA branches are stable and within normal limits. Left MCA M1 segment and trifurcation are patent without stenosis. Left MCA branches appear stable and within normal limits. Right MCA M1 segment is patent to a bifurcation with a small chronic saccular aneurysm at the anterior temporal artery origin best seen series 12, image 35. This lesion is 2-3 mm (series 10, image 89) and chronic although more conspicuous this year probably due to better contrast timing (series 7, image 90 last year). No associated stenosis. Right MCA bifurcation is patent and right MCA branches appear stable and within normal limits. Venous sinuses: Early contrast timing, grossly patent. Anatomic variants: Mildly dominant right vertebral artery. Review of the MIP images confirms the above findings IMPRESSION: 1. Negative for large vessel occlusion.  Negative CT Perfusion. 2. 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 Recommend Neuro Endovascular follow-up to evaluate the appropriateness of potential treatment. 3. But generally mild for age atherosclerosis in the head and neck, stable since last year. There is mild to moderate right ICA siphon pre cavernous stenosis. 4. Aortic Atherosclerosis (ICD10-I70.0) and Emphysema (ICD10-J43.9). Salient findings were communicated to Dr. Quinn Axe at 10:04 am on 01/08/2022 by text page via the [facility] messaging system. Electronically Signed   By: Genevie Ann M.D.   On: 01/08/2022 10:06   DG Abdomen 1 View  Result Date: 01/08/2022 CLINICAL DATA:  71 year old female with possible sepsis. Code stroke presentation. EXAM: ABDOMEN - 1 VIEW COMPARISON:  Portable chest today reported separately. Abdominal radiographs 03/06/2013. FINDINGS: Portable AP supine view at 0906 hours. Patient is rotated to the right. Enteric tube placed into the stomach, side hole at the level of the gastric body. Tip is in the right upper quadrant. Lung bases appear clear. Non obstructed bowel gas pattern. No acute osseous abnormality identified. IMPRESSION: Enteric tube side hole at the gastric body level. Tip may be in the distal stomach or proximal duodenum. Nonobstructed visible bowel gas pattern. Electronically Signed   By: Genevie Ann M.D.   On: 01/08/2022 09:18   DG Chest Port 1 View  Result Date: 01/08/2022 CLINICAL DATA:  71 year old female with possible sepsis. Code stroke presentation. EXAM: PORTABLE CHEST 1 VIEW COMPARISON:  Portable chest 12/10/2021 and earlier. FINDINGS: Portable AP semi upright view at 0904 hours. Rotated to the right today. Intubated. Endotracheal tube tip in good position between the clavicles and carina. Enteric tube placed into the stomach, loops in the visible abdomen. Stable left chest AICD. Stable cardiac size and mediastinal contours. Mildly lower lung volumes. But Allowing for portable  technique the lungs are clear. Stable visualized osseous structures. IMPRESSION: 1. Satisfactory ET tube and enteric tube placement. 2. No acute cardiopulmonary abnormality. Electronically Signed   By: Genevie Ann M.D.   On: 01/08/2022 09:16   CT HEAD CODE STROKE WO CONTRAST  Result Date: 01/08/2022 CLINICAL DATA:  Code stroke.  71 year old female. EXAM: CT HEAD WITHOUT CONTRAST TECHNIQUE: Contiguous axial images were obtained from the base of the skull through the vertex without intravenous contrast. RADIATION DOSE REDUCTION: This exam was performed according to the departmental dose-optimization program which includes automated exposure control, adjustment of the mA and/or kV according to patient size and/or use of iterative reconstruction technique. COMPARISON:  12/10/2021. FINDINGS: Brain: Intermittent motion artifact. Stable cerebral volume. No midline shift, mass effect, or evidence of intracranial mass lesion. No ventriculomegaly. Chronic cortical encephalomalacia in the left parietal lobe and occipital pole appears stable from last month. Patchy and confluent additional bilateral white matter hypodensity with some deep white matter capsule involvement. Small area of right middle frontal gyrus cortical encephalomalacia appears stable, chronic series 2, image 22. No acute intracranial hemorrhage identified. No acute cortically based infarct identified. Vascular: No suspicious intracranial vascular hyperdensity. Degraded Skull: Motion artifact at the skull base. No acute osseous abnormality identified. Sinuses/Orbits: Left sphenoid sinus fluid and mucosal thickening persists. Other Visualized paranasal sinuses and mastoids are clear. Other: Some rightward gaze. Otherwise negative orbit and scalp soft tissues. ASPECTS [facility] Stroke Program Early CT Score) Total score (0-10 with 10 being normal): 10 (chronic encephalomalacia suspected) IMPRESSION: 1. Motion degraded exam. Scattered bilateral small cortical  infarcts appear stable since last month. Bilateral white matter disease. No acute cortically based infarct or acute intracranial hemorrhage identified. ASPECTS 10. 2. These results were communicated to  Dr. Quinn Axe at 8:35 am on 01/08/2022 by text page via the [facility] messaging system. Electronically Signed   By: Genevie Ann M.D.   On: 01/08/2022 08:36   CUP PACEART REMOTE DEVICE CHECK  Result Date: 01/03/2022 Scheduled remote reviewed. Normal device function.  There were atrial arrhythmias detected, EGMs show AT, atrial arrhythmia burden is <1%. Next remote 91 days. Auriella Breach, RN, CCDS, [employer]    Subjective: No acute issues/events overnight   Discharge Exam: Vitals:   01/17/22 0722 01/17/22 1143  BP: (!) 137/48 134/74  Pulse: 84 96  Resp: 18 18  Temp: 97.7 F (36.5 C) 98.3 F (36.8 C)  SpO2: 100% 95%   Vitals:   01/17/22 0200 01/17/22 0321 01/17/22 0722 01/17/22 1143  BP:  (!) 132/53 (!) 137/48 134/74  Pulse:  80 84 96  Resp: 18 17 18 18   Temp:  97.8 F (36.6 C) 97.7 F (36.5 C) 98.3 F (36.8 C)  TempSrc:  Axillary Oral Oral  SpO2:  100% 100% 95%  Weight:      Height:        General: Pt is alert, awake, not in acute distress Cardiovascular: RRR, S1/S2 +, no rubs, no gallops Respiratory: CTA bilaterally, no wheezing, no rhonchi Abdominal: Soft, NT, ND, bowel sounds + Extremities: no edema, no cyanosis  The results of significant diagnostics from this hospitalization (including imaging, microbiology, ancillary and laboratory) are listed below for reference.     Microbiology: Recent Results (from the past 240 hour(s))  Blood Culture (routine x 2)     Status: None   Collection Time: 01/08/22  8:53 AM   Specimen: BLOOD RIGHT HAND  Result Value Ref Range Status   Specimen Description BLOOD RIGHT HAND  Final   Special Requests   Final    BOTTLES DRAWN AEROBIC AND ANAEROBIC Blood Culture results may not be optimal due to an inadequate volume of blood received in  culture bottles   Culture   Final    NO GROWTH 5 DAYS Performed at [facility], [place] [address], [place], [place] [address]    Report Status 01/13/2022 FINAL  Final  Blood Culture (routine x 2)     Status: None   Collection Time: 01/08/22  8:58 AM   Specimen: BLOOD LEFT HAND  Result Value Ref Range Status   Specimen Description BLOOD LEFT HAND  Final   Special Requests   Final    BOTTLES DRAWN AEROBIC AND ANAEROBIC Blood Culture results may not be optimal due to an inadequate volume of blood received in culture bottles   Culture   Final    NO GROWTH 5 DAYS Performed at [facility], [place] [address], [place], [place] [address]    Report Status 01/13/2022 FINAL  Final  Urine Culture     Status: None   Collection Time: 01/08/22 11:33 AM   Specimen: In/Out Cath Urine  Result Value Ref Range Status   Specimen Description IN/OUT CATH URINE  Final   Special Requests NONE  Final   Culture   Final    NO GROWTH Performed at [facility], [place] [address] [place], [place] [address]    Report Status 01/09/2022 FINAL  Final  MRSA Next Gen by PCR, Nasal     Status: None   Collection Time: 01/08/22  1:37 PM   Specimen: Nasal Mucosa; Nasal Swab  Result Value Ref Range Status   MRSA by PCR Next Gen NOT DETECTED  NOT DETECTED Final    Comment: (NOTE) The GeneXpert MRSA Assay (FDA approved for NASAL specimens only), is one component of a comprehensive MRSA colonization surveillance program. It is not intended to diagnose MRSA infection nor to guide or monitor treatment for MRSA infections. Test performance is not FDA approved in patients less than 38 years old. Performed at [facility], [place] [address] [place], [place] [address]      Labs: BNP (last 3 results) Recent Labs    10/23/21 1939 12/10/21 1322  BNP 316.7* 32.9   Basic Metabolic Panel: Recent Labs  Lab 01/12/22 0302 01/13/22 0258 01/14/22 0412 01/14/22 0643 01/15/22 0244 01/16/22 0355  NA 144  144 143  --  143 142  K 3.5 4.1 2.8*  --  3.1* 3.9  CL 105 96* 90*  --  91* 89*  CO2 30 37* 38*  --  38* 39*  GLUCOSE 83 85 88  --  85 92  BUN <5* 6* 6*  --  14 20  CREATININE 0.88 0.84 1.02*  --  1.17* 1.17*  CALCIUM 8.1* 8.1* 8.0*  --  8.2* 8.3*  MG  --  1.8  --  1.7 2.4  --    Liver Function Tests: Recent Labs  Lab 01/16/22 0355  AST 19  ALT 13  ALKPHOS 137*  BILITOT 1.2  PROT 6.7  ALBUMIN 2.1*   No results for input(s): "LIPASE", "AMYLASE" in the last 168 hours. Recent Labs  Lab 01/15/22 1707 01/16/22 0355  AMMONIA 25 16   CBC: Recent Labs  Lab 01/11/22 0809 01/12/22 0302 01/13/22 0258 01/16/22 0355  WBC 7.7 7.6 10.9* 13.1*  HGB 8.9* 8.9* 10.6* 10.0*  HCT 29.2* 30.3* 34.6* 31.9*  MCV 87.7 88.6 86.9 83.5  PLT 250 222 196 249   Cardiac Enzymes: No results for input(s): "CKTOTAL", "CKMB", "CKMBINDEX", "TROPONINI" in the last 168 hours. BNP: Invalid input(s): "POCBNP" CBG: Recent Labs  Lab 01/16/22 0604 01/16/22 1107 01/16/22 1635 01/16/22 2136 01/17/22 0615  GLUCAP 94 88 135* 91 86   D-Dimer No results for input(s): "DDIMER" in the last 72 hours. Hgb A1c No results for input(s): "HGBA1C" in the last 72 hours. Lipid Profile No results for input(s): "CHOL", "HDL", "LDLCALC", "TRIG", "CHOLHDL", "LDLDIRECT" in the last 72 hours. Thyroid function studies No results for input(s): "TSH", "T4TOTAL", "T3FREE", "THYROIDAB" in the last 72 hours.  Invalid input(s): "FREET3" Anemia work up No results for input(s): "VITAMINB12", "FOLATE", "FERRITIN", "TIBC", "IRON", "RETICCTPCT" in the last 72 hours. Urinalysis    Component Value Date/Time   COLORURINE COLORLESS (A) 01/08/2022 1140   APPEARANCEUR CLEAR 01/08/2022 1140   APPEARANCEUR Clear 07/31/2013 1658   LABSPEC 1.011 01/08/2022 1140   LABSPEC 1.018 07/31/2013 1658   PHURINE 6.0 01/08/2022 1140   GLUCOSEU NEGATIVE 01/08/2022 1140   GLUCOSEU Negative 07/31/2013 1658   HGBUR NEGATIVE 01/08/2022 1140    BILIRUBINUR NEGATIVE 01/08/2022 1140   BILIRUBINUR Negative 07/31/2013 1658   KETONESUR NEGATIVE 01/08/2022 1140   PROTEINUR NEGATIVE 01/08/2022 1140   UROBILINOGEN 0.2 03/28/2012 1526   NITRITE NEGATIVE 01/08/2022 1140   LEUKOCYTESUR NEGATIVE 01/08/2022 1140   LEUKOCYTESUR Negative 07/31/2013 1658   Sepsis Labs Recent Labs  Lab 01/11/22 0809 01/12/22 0302 01/13/22 0258 01/16/22 0355  WBC 7.7 7.6 10.9* 13.1*   Microbiology Recent Results (from the past 240 hour(s))  Blood Culture (routine x 2)     Status: None   Collection Time: 01/08/22  8:53 AM   Specimen: BLOOD RIGHT  HAND  Result Value Ref Range Status   Specimen Description BLOOD RIGHT HAND  Final   Special Requests   Final    BOTTLES DRAWN AEROBIC AND ANAEROBIC Blood Culture results may not be optimal due to an inadequate volume of blood received in culture bottles   Culture   Final    NO GROWTH 5 DAYS Performed at [facility], [place] [address] [place], [place] [address]    Report Status 01/13/2022 FINAL  Final  Blood Culture (routine x 2)     Status: None   Collection Time: 01/08/22  8:58 AM   Specimen: BLOOD LEFT HAND  Result Value Ref Range Status   Specimen Description BLOOD LEFT HAND  Final   Special Requests   Final    BOTTLES DRAWN AEROBIC AND ANAEROBIC Blood Culture results may not be optimal due to an inadequate volume of blood received in culture bottles   Culture   Final    NO GROWTH 5 DAYS Performed at [facility], [place] [address], [place], [place] [address]    Report Status 01/13/2022 FINAL  Final  Urine Culture     Status: None   Collection Time: 01/08/22 11:33 AM   Specimen: In/Out Cath Urine  Result Value Ref Range Status   Specimen Description IN/OUT CATH URINE  Final   Special Requests NONE  Final   Culture   Final    NO GROWTH Performed at [facility], [place] [address] [place], [place] [address]    Report Status 01/09/2022 FINAL  Final  MRSA Next Gen by PCR, Nasal      Status: None   Collection Time: 01/08/22  1:37 PM   Specimen: Nasal Mucosa; Nasal Swab  Result Value Ref Range Status   MRSA by PCR Next Gen NOT DETECTED NOT DETECTED Final    Comment: (NOTE) The GeneXpert MRSA Assay (FDA approved for NASAL specimens only), is one component of a comprehensive MRSA colonization surveillance program. It is not intended to diagnose MRSA infection nor to guide or monitor treatment for MRSA infections. Test performance is not FDA approved in patients less than 35 years old. Performed at [facility], [place] [address], [place], [place] [address]      Time coordinating discharge: Over 30 minutes  SIGNED:   Little Ishikawa, DO [facility] 01/17/2022, 12:25 PM Pager   If 7PM-7AM, please contact night-coverage [email or web address]

## 2022-01-17 NOTE — Progress Notes (Signed)
   Palliative Medicine Inpatient Follow Up Note HPI: This is a 71 year old female with multiple comorbidities including dementia, essential hypertension, sleep apnea, diabetes mellitus type 2, chronic systolic congestive heart failure who presented with seizure Activity. Required ventilator support and was in the ICU. She was stabilized and then transferred to the floor.    Palliative care has been asked to get involved to further discuss goals of care in the setting of multiple chronic comorbid conditions and poor functional state.  Today's Discussion 01/17/2022  *Please note that this is a verbal dictation therefore any spelling or grammatical errors are due to the "[employer]" system interpretation.  Chart reviewed inclusive of vital signs, progress notes, laboratory results, and diagnostic images.   I checked in this morning with Stacy Grimes and her husband, Stacy Grimes. Stacy Grimes is doing much better today she is far more responsive and eating a peanut butter cup during my time with her. Stacy Grimes is present and vocalizes his delight at her improvements.   Created space and opportunity for patient spouse to explore thoughts feelings and fears regarding current medical situation.  Discussed the importance of patient to be able to take in PO's. Patients spouse remains optimistic for improvements. He shares that tremors have improved and patient is now able to hold a cup which is a great difference from yesterday.  Goals are for patient to be medically optimized to go home.   Questions and concerns addressed/Palliative Support Provided.   Objective Assessment: Vital Signs Vitals:   01/17/22 0321 01/17/22 0722  BP: (!) 132/53 (!) 137/48  Pulse: 80 84  Resp: 17 18  Temp: 97.8 F (36.6 C) 97.7 F (36.5 C)  SpO2: 100% 100%    Intake/Output Summary (Last 24 hours) at 01/17/2022 1140 Last data filed at 01/16/2022 2000 Gross per 24 hour  Intake 120 ml  Output 550 ml  Net -430 ml   Last Weight   Most recent update: 01/14/2022  4:14 AM    Weight  71.7 kg (158 lb 1.1 oz)            Gen:  Elderly Caucasian F in NAD HEENT: oral thrush, moist mucous membranes CV: Regular rate and rhythm  PULM:  On 2LPM HFNC, breathing is even and non-labored ABD: soft/nontender  EXT: No edema  Neuro: Alert to self  SUMMARY OF RECOMMENDATIONS   Full Code/Full Scope of Care --> patient's spouse does not believe she would ever want to live in a prolonged state of vegetation though he cannot determine if he would ever desire tracheostomy or gastrostomy tube until he were in that situation   Continue current care   Goals are for improvement and to transition home   Ongoing palliative care support  Symptom Management:  Oral Thrush: - Have alerted the primary team - Would benefit from diflucan in addition to oral nystatin - Diligent mouth care  Billing based on MDM: High ______________________________________________________________________________________ [place] Team Team Cell Phone: [phone] Please utilize secure chat with additional questions, if there is no response within 30 minutes please call the above phone number  Palliative Medicine Team providers are available by phone from 7am to 7pm daily and can be reached through the team cell phone.  Should this patient require assistance outside of these hours, please call the patient's attending physician.

## 2022-01-18 ENCOUNTER — Telehealth: Payer: Self-pay

## 2022-01-18 NOTE — Telephone Encounter (Signed)
Received the following high alert from [employer]:  Device alert for VF with successful therapy Events occurred 12/11 between 08:54 - 08:55 EGM shows onset of sustained VT/VF, HR 266, ATP delivered x1 with no change, HV therapy delivered 36J converting to AP/BiV pace.  A NSVT without therapy follows HR 240, and a second VF event occurs, HR 266, unsuccessful ATPx1, followed by HV therapy at 36J converting to AS-AP/VS-BiV pace Presenting AS/BiV pace ~100 bpm Route to triage LA  Note: patient was in the hospital admitted for seizure 12/5 - 12/14.  Shock X2 events occurred on 01/14/22 between 854 and 855 am.  Discharged home yesterday 01/17/22.  I have LM for patient and husband on her VM (okay per DPR).  Patient or husband to call back and follow up. Needs appt in next week and driving restriction instruction review as well.

## 2022-01-18 NOTE — Telephone Encounter (Signed)
Patient's husband reports that patient is doing okay after all she has been through, just very tired, weak and currently is resting.  He would rather not have his wife come in for appointments right now due to her issues and severe weakness.  Husband states that Dr. Caryl Comes talked with them while she was in the hospital.  He states Dr. Caryl Comes is aware of her situation and is okay not having her come in to appointments right now.   She has not driven since 6010 due to her dementia and overall health condition and will not be in the future.  They are aware if any urgent, emergent symptoms to go back to the ER.   Will flag for Dr. Caryl Comes and see if anything further at this point or just continue to monitor remotely. Currently there are no appointments for follow up with EP on the schedule.

## 2022-01-18 NOTE — Telephone Encounter (Signed)
Pt husband left a voicemail for the nurse to give him a call back.

## 2022-01-21 ENCOUNTER — Ambulatory Visit (INDEPENDENT_AMBULATORY_CARE_PROVIDER_SITE_OTHER): Payer: Medicare HMO

## 2022-01-21 DIAGNOSIS — Z95810 Presence of automatic (implantable) cardiac defibrillator: Secondary | ICD-10-CM

## 2022-01-21 DIAGNOSIS — I5022 Chronic systolic (congestive) heart failure: Secondary | ICD-10-CM

## 2022-01-21 DIAGNOSIS — R413 Other amnesia: Secondary | ICD-10-CM | POA: Diagnosis not present

## 2022-01-21 NOTE — Progress Notes (Signed)
EPIC Encounter for ICM Monitoring  Patient Name: Stacy Grimes is a 71 y.o. female Date: 01/21/2022 Primary Care Physican: Cyndi Bender, PA-C Primary Cardiologist: Fletcher Anon Electrophysiologist: Vergie Living Pacing:  95%  10/12/2020 Office Weight: 184 lbs 01/21/2022 Weight: Unable to weigh at this time due to dizziness   AT/AF Burden <1% (taking Aspirin & Plavix)   Spoke to husband per [place] and patient was discharged home 12/14 with new dx of seizure.  She is now on seizure medicine and doing okay at this time.   She was hospitalized 12/5-12/14.    Coruve thoracic impedance suggesting normal fluid levels but was suggesting possible fluid accumulation 12/04 and returned to normal on 12/14.     Prescribed:  Furosemide 20 mg Take 1 tablet (20 mg total) by mouth every other day.  May take an additional tablet AS NEEDED for weight gain of 3 lbs overnight or 5 lbs in one week. Do not exceed more than 3 additional doses.   Potassium 20 mEq take 1 tablet daily    Labs: 01/09/2022 Creatinine 0.97, BUN 6, Potassium 4.1, Sodium 141, GFR >60 01/08/2022 Creatinine 0.85, BUN 8, Potassium 4.2, Sodium 141, GFR >60  12/13/2021 Creatinine 1.07, BUN 20, Potassium 4.4, Sodium 141, GFR 55  12/11/2021 Creatinine 1.38, BUN 20, Potassium 4.5, Sodium 140, GFR 41  A complete set of results can be found in Results Review.   Recommendations:  No changes and encouraged to call if pt experiences any fluid symptoms.    Follow-up plan: ICM clinic phone appointment on 01/29/2022 to recheck fluid levels.  91 day device clinic remote transmission scheduled 04/04/2022.     EP/Cardiology Office Visits:  03/30/2022 (changed from 01/17/2022) with Dr Fletcher Anon.  Missed 01/01/2022 with Dr Caryl Comes.   Last OV with Dr Caryl Comes was 07/16/2017.    Copy of ICM check sent to Dr. Caryl Comes.     3 month ICM trend: 01/21/2022.    12-14 Month ICM trend:     [name], RN 01/21/2022 11:49 AM

## 2022-01-23 NOTE — Progress Notes (Signed)
Remote ICD transmission.   

## 2022-01-25 NOTE — Telephone Encounter (Signed)
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

## 2022-01-29 ENCOUNTER — Ambulatory Visit (INDEPENDENT_AMBULATORY_CARE_PROVIDER_SITE_OTHER): Payer: Medicare HMO

## 2022-01-29 DIAGNOSIS — I5022 Chronic systolic (congestive) heart failure: Secondary | ICD-10-CM

## 2022-01-29 DIAGNOSIS — Z95810 Presence of automatic (implantable) cardiac defibrillator: Secondary | ICD-10-CM

## 2022-01-30 NOTE — Telephone Encounter (Signed)
The nurse called Mandy back. She states the provider said it was okay to have home health to come out and do the BMEP bloodwork. They said to use this code z79.899

## 2022-01-30 NOTE — Telephone Encounter (Signed)
Spoke with patient's husband.  He refuses to bring patient in for lab work or appt.  States patient cannot even transfer and is too weak.  Mentioned non emergency transport options. At this time, not wanting to have that.   Patient does have home health coming in.  I reached out to her PCP, Dr. Lisbeth Ply with [facility]. Requested for her to have home health check a stat BMET  Due to TdP and hypokalemia in hospital and need to re-eval.

## 2022-01-30 NOTE — Progress Notes (Signed)
EPIC Encounter for ICM Monitoring  Patient Name: Stacy Grimes is a 71 y.o. female Date: 01/30/2022 Primary Care Physican: Cyndi Bender, PA-C Primary Cardiologist: Fletcher Anon Electrophysiologist: Vergie Living Pacing:  97%  10/12/2020 Office Weight: 184 lbs 01/21/2022 Weight: Unable to weigh at this time due to dizziness   AT/AF Burden <1% (taking Aspirin & Plavix)   Spoke with husband per DPR and heart failure questions reviewed.  Transmission results reviewed.  Pt is feeling okay and not having any further seizures since hospitalization.       Coruve thoracic impedance suggesting normal fluid levels since 12/14 (date of hospital discharge).     Prescribed:  Furosemide 20 mg Take 1 tablet (20 mg total) by mouth every other day.  May take an additional tablet AS NEEDED for weight gain of 3 lbs overnight or 5 lbs in one week. Do not exceed more than 3 additional doses.   Potassium 20 mEq take 1 tablet daily    Labs: 01/16/2022 Creatinine 1.17, BUN 20, Potassium 3.9, Sodium 142, GFR 50 01/15/2022 Creatinine 1.17, BUN 14, Potassium 3.1, Sodium 143, GFR 50  01/14/2022 Creatinine 1.02, BUN 6,   Potassium 2.8, Sodium 143, GFR 58  01/13/2022 Creatinine 0.84, BUN 6,   Potassium 4.1, Sodium 144, GFR >60  01/12/2022 Creatinine 0.88, BUN <5, Potassium 3.5, Sodium 144, GFR >60 01/11/2022 Creatinine 0.85, BUN 6,   Potassium 4.0, Sodium 142, GFR >60 01/10/2022 Creatinine 0.91, BUN 7,   Potassium 3.7, Sodium 144, GFR >60  01/09/2022 Creatinine 0.97, BUN 6,   Potassium 4.1, Sodium 141, GFR >60 01/08/2022 Creatinine 0.85, BUN 8,   Potassium 4.2, Sodium 141, GFR >60  12/13/2021 Creatinine 1.07, BUN 20, Potassium 4.4, Sodium 141, GFR 55  12/11/2021 Creatinine 1.38, BUN 20, Potassium 4.5, Sodium 140, GFR 41  A complete set of results can be found in Results Review.   Recommendations:  No changes and encouraged to call if experiencing any fluid symptoms.   Follow-up plan: ICM clinic phone appointment on  02/06/2022 to recheck fluid levels.  91 day device clinic remote transmission scheduled 04/04/2022.     EP/Cardiology Office Visits:  03/30/2022 (changed from 01/17/2022) with Dr Fletcher Anon.  Missed 01/01/2022 with Dr Caryl Comes.   Last OV with Dr Caryl Comes was 07/16/2017.    Copy of ICM check sent to Dr. Caryl Comes.     3 month ICM trend: 01/29/2022.    12-14 Month ICM trend:     [name], RN 01/30/2022 8:03 AM

## 2022-01-31 ENCOUNTER — Encounter: Payer: Self-pay | Admitting: Internal Medicine

## 2022-01-31 NOTE — Telephone Encounter (Signed)
Error

## 2022-01-31 NOTE — Telephone Encounter (Signed)
Pt spouse calling wanting to know if lab order for pt potassium was sent to Dr. Lisbeth Ply.

## 2022-02-01 NOTE — Telephone Encounter (Signed)
Spoke with patient's husband.  He states he spoke with Dr. Onnie Boer office about the order for the K.  Husband says that Dr. Dudley Major office put in order for K to be drawn. Nurse came this past Wednesday but was not able to get blood from patient's vein. Another nurse is coming next week to attempt a re-draw.  Husband confirmed that [facility] is the home health agency. I called today at [phone] and their after hours number: [phone]. No answer, received out of office holiday VM. I LM with my contact info for their office to call me ASAP so that we can ensure that K level is drawn as soon as possible and that we receive a fax copy of the result.

## 2022-02-06 ENCOUNTER — Ambulatory Visit (INDEPENDENT_AMBULATORY_CARE_PROVIDER_SITE_OTHER): Payer: Medicare HMO

## 2022-02-06 NOTE — Progress Notes (Signed)
EPIC Encounter for ICM Monitoring  Patient Name: Stacy Grimes is a 72 y.o. female Date: 02/06/2022 Primary Care Physican: Cyndi Bender, PA-C Primary Cardiologist: Fletcher Anon Electrophysiologist: Vergie Living Pacing:  98%  10/12/2020 Office Weight: 184 lbs 01/21/2022 Weight: Unable to weigh at this time due to dizziness   AT/AF Burden <1% (taking Aspirin & Plavix)   Spoke with husband per DPR and heart failure questions reviewed.  Transmission results reviewed.  Pt is feeling okay and not having any further seizures since hospitalization.       Coruve thoracic impedance suggesting normal fluid levels since 12/14 (date of hospital discharge).     Prescribed:  Furosemide 20 mg Take 1 tablet (20 mg total) by mouth every other day.  May take an additional tablet AS NEEDED for weight gain of 3 lbs overnight or 5 lbs in one week. Do not exceed more than 3 additional doses.   Potassium 20 mEq take 1 tablet daily    Labs: 01/16/2022 Creatinine 1.17, BUN 20, Potassium 3.9, Sodium 142, GFR 50 01/15/2022 Creatinine 1.17, BUN 14, Potassium 3.1, Sodium 143, GFR 50  01/14/2022 Creatinine 1.02, BUN 6,   Potassium 2.8, Sodium 143, GFR 58  01/13/2022 Creatinine 0.84, BUN 6,   Potassium 4.1, Sodium 144, GFR >60  01/12/2022 Creatinine 0.88, BUN <5, Potassium 3.5, Sodium 144, GFR >60 A complete set of results can be found in Results Review.   Recommendations:  No changes and encouraged to call if experiencing any fluid symptoms.   Follow-up plan: ICM clinic phone appointment on 03/04/2022.  91 day device clinic remote transmission scheduled 04/04/2022.     EP/Cardiology Office Visits:  03/28/2022 (changed from 01/17/2022) with Dr Fletcher Anon.  Missed 01/01/2022 with Dr Caryl Comes.   Last OV with Dr Caryl Comes was 07/16/2017.    Copy of ICM check sent to Dr. Caryl Comes.     3 month ICM trend: 02/06/2022.    12-14 Month ICM trend:     [name], RN 02/06/2022 2:38 PM

## 2022-02-07 DIAGNOSIS — G40901 Epilepsy, unspecified, not intractable, with status epilepticus: Secondary | ICD-10-CM | POA: Diagnosis not present

## 2022-02-07 DIAGNOSIS — J9621 Acute and chronic respiratory failure with hypoxia: Secondary | ICD-10-CM | POA: Diagnosis not present

## 2022-02-07 DIAGNOSIS — F0284 Dementia in other diseases classified elsewhere, unspecified severity, with anxiety: Secondary | ICD-10-CM | POA: Diagnosis not present

## 2022-02-07 DIAGNOSIS — E876 Hypokalemia: Secondary | ICD-10-CM | POA: Diagnosis not present

## 2022-02-07 NOTE — Telephone Encounter (Signed)
Pt calling back just wanting to let Dr. Caryl Comes know that pt had her labs drawn at [facility]

## 2022-02-08 NOTE — Telephone Encounter (Signed)
Spoke with Dr. Dudley Major office today at [facility]. They state that patient did not have labs yesterday. I called husband and informed him. He says that actually the [place] is the one who drew at home yesterday. He says that they called him and told him levels were normal and when he asked about the K, they told him " it was like 4. Something" he wasn't sure.   I have left a message for [facility] [phone]) on the nurse line. Needing fax confirmation on what the blood work results were. In particular what the K is.

## 2022-02-11 NOTE — Telephone Encounter (Signed)
[facility] nurse called back and stated they did draw a BMP on patient last week. I have asked for faxed copy to be sent for our review and documentation.  Nurse to call back if unable to fax to [place] at [phone].

## 2022-02-18 NOTE — Telephone Encounter (Signed)
LM again for [facility] nurse to get back in touch with me with Potassium results.  Left my direct contact info and our fax number to get result.  I also talked with husband and let him know that we are still unable to get the results we need.  Husband is going to call home health as well and see if he can get them to send [place] what we need.

## 2022-02-20 ENCOUNTER — Telehealth: Payer: Self-pay | Admitting: Internal Medicine

## 2022-02-20 NOTE — Telephone Encounter (Signed)
Patient's husband would like to know if lab results from [facility] have been received. Please advise.

## 2022-02-20 NOTE — Telephone Encounter (Signed)
I spoke with the patient's husband.  He called to inquire if [facility] had sent the patient's most recent lab results. I advised that I could not see that labs had been sent over on the patient.  Reviewed under [facility] tab. I advised I could see a repeat BMP from 02/07/22- K+ 4.2. Per Mr. Danziger this was ~ when the patient had her labs done.  In reviewing the patient's chart, there is documentation from the Device clinic under a 01/18/22 phone note stating: Kizzie Ide, RN      01/18/22  9:01 AM Note Received the following high alert from [employer]:   Device alert for VF with successful therapy Events occurred 12/11 between 08:54 - 08:55 EGM shows onset of sustained VT/VF, HR 266, ATP delivered x1 with no change, HV therapy delivered 36J converting to AP/BiV pace.  A NSVT without therapy follows HR 240, and a second VF event occurs, HR 266, unsuccessful ATPx1, followed by HV therapy at 36J converting to AS-AP/VS-BiV pace Presenting AS/BiV pace ~100 bpm Route to triage      The patient was transported to the ER after what her husband thought was seizure activity.  Her potassium on arrival was 2.8.  Dr. Graciela Husbands was made aware of this and a repeat BMP was ordered post discharge.  I spent ~ 40 ,minutes on the phone with the patient's husband. He was inquiring if Keppra could be causing diarrhea for the patient and that he has tried backing off on the dose. I have advised to call Dr. Daisy Blossom office tomorrow to follow up and if they feel the diarrhea is not coming from the Keppra, to call her PCP.  Mr. Alred discussed the difficulties in getting the patient to and from her appointments as she is not very mobile at this time- she will become dizzy very easily when sitting up in the bed.  He is requesting a call from Dr. Graciela Husbands to discuss the patient's status at this time.  She is schedued for an in office visit with Dr. Kirke Corin on 03/08/22, but the patient's spouse is unsure if  they will make this.   Mr. Ryant is aware I will forward the above to Dr. Graciela Husbands to review and he or I will reach out after. He voices understanding and is agreeable.

## 2022-02-21 NOTE — Telephone Encounter (Signed)
She has followup scheduled with Dr MA in a few weeks Is there a way to arrange for a repeat BMET at that time Thanks SK

## 2022-02-23 ENCOUNTER — Other Ambulatory Visit: Payer: Self-pay | Admitting: Cardiovascular Disease

## 2022-02-25 NOTE — Telephone Encounter (Signed)
Noted and Stacy Grimes thanks for your efforts !!!

## 2022-02-26 ENCOUNTER — Encounter: Payer: Self-pay | Admitting: *Deleted

## 2022-03-04 ENCOUNTER — Ambulatory Visit: Payer: Medicare HMO | Attending: Internal Medicine

## 2022-03-04 DIAGNOSIS — Z95810 Presence of automatic (implantable) cardiac defibrillator: Secondary | ICD-10-CM

## 2022-03-04 DIAGNOSIS — I5022 Chronic systolic (congestive) heart failure: Secondary | ICD-10-CM | POA: Diagnosis not present

## 2022-03-05 NOTE — Progress Notes (Signed)
EPIC Encounter for ICM Monitoring  Patient Name: Stacy Grimes is a 72 y.o. female Date: 03/05/2022 Primary Care Physican: Cyndi Bender, PA-C Primary Cardiologist: Fletcher Anon Electrophysiologist: Vergie Living Pacing:  97%  10/12/2020 Office Weight: 184 lbs 03/05/2022 Weight:  Unable to weigh at home   AT/AF Burden <1% (taking Aspirin & Plavix)   Spoke with husband per DPR and heart failure questions reviewed.  Transmission results reviewed.  Pt is feeling okay but physically and mentally continues to decline.   Coruve thoracic impedance suggesting possible fluid accumulation within last week.     Prescribed:  Furosemide 20 mg Take 1 tablet (20 mg total) by mouth every other day.  May take an additional tablet AS NEEDED for weight gain of 3 lbs overnight or 5 lbs in one week. Do not exceed more than 3 additional doses.   Potassium 20 mEq take 1 tablet daily    Labs: 01/16/2022 Creatinine 1.17, BUN 20, Potassium 3.9, Sodium 142, GFR 50 01/15/2022 Creatinine 1.17, BUN 14, Potassium 3.1, Sodium 143, GFR 50  01/14/2022 Creatinine 1.02, BUN 6,   Potassium 2.8, Sodium 143, GFR 58  01/13/2022 Creatinine 0.84, BUN 6,   Potassium 4.1, Sodium 144, GFR >60  01/12/2022 Creatinine 0.88, BUN <5, Potassium 3.5, Sodium 144, GFR >60 01/11/2022 Creatinine 0.85, BUN 6,   Potassium 4.0, Sodium 142, GFR >60 01/10/2022 Creatinine 0.91, BUN 7,   Potassium 3.7, Sodium 144, GFR >60  01/09/2022 Creatinine 0.97, BUN 6,   Potassium 4.1, Sodium 141, GFR >60 01/08/2022 Creatinine 0.85, BUN 8,   Potassium 4.2, Sodium 141, GFR >60  12/13/2021 Creatinine 1.07, BUN 20, Potassium 4.4, Sodium 141, GFR 55  12/11/2021 Creatinine 1.38, BUN 20, Potassium 4.5, Sodium 140, GFR 41  A complete set of results can be found in Results Review.   Recommendations:  Advised to to take one extra Furosemide on her day off.     Follow-up plan: ICM clinic phone appointment on 03/11/2022 to recheck fluid levels.  91 day device clinic remote  transmission scheduled 04/04/2022.     EP/Cardiology Office Visits:  03/30/2022 (changed from 01/17/2022) with Dr Fletcher Anon.  Missed 01/01/2022 with Dr Caryl Comes.   Last OV with Dr Caryl Comes was 07/16/2017.    Copy of ICM check sent to Dr. Caryl Comes.     3 month ICM trend: 03/04/2022.    12-14 Month ICM trend:     [name], RN 03/05/2022 12:54 PM

## 2022-03-06 ENCOUNTER — Other Ambulatory Visit: Payer: Self-pay

## 2022-03-06 ENCOUNTER — Encounter: Payer: Self-pay | Admitting: Emergency Medicine

## 2022-03-06 ENCOUNTER — Emergency Department: Payer: Medicare HMO

## 2022-03-06 ENCOUNTER — Inpatient Hospital Stay
Admission: EM | Admit: 2022-03-06 | Discharge: 2022-03-10 | DRG: 101 | Disposition: A | Discharge status: Home Health | Payer: Medicare HMO | Attending: Internal Medicine | Admitting: Internal Medicine

## 2022-03-06 DIAGNOSIS — E119 Type 2 diabetes mellitus without complications: Secondary | ICD-10-CM | POA: Diagnosis present

## 2022-03-06 DIAGNOSIS — F03918 Unspecified dementia, unspecified severity, with other behavioral disturbance: Secondary | ICD-10-CM | POA: Diagnosis present

## 2022-03-06 DIAGNOSIS — Z800 Family history of malignant neoplasm of digestive organs: Secondary | ICD-10-CM

## 2022-03-06 DIAGNOSIS — R4182 Altered mental status, unspecified: Secondary | ICD-10-CM | POA: Diagnosis not present

## 2022-03-06 DIAGNOSIS — F0393 Unspecified dementia, unspecified severity, with mood disturbance: Secondary | ICD-10-CM | POA: Diagnosis present

## 2022-03-06 DIAGNOSIS — Z9981 Dependence on supplemental oxygen: Secondary | ICD-10-CM

## 2022-03-06 DIAGNOSIS — F419 Anxiety disorder, unspecified: Secondary | ICD-10-CM | POA: Diagnosis present

## 2022-03-06 DIAGNOSIS — G40919 Epilepsy, unspecified, intractable, without status epilepticus: Secondary | ICD-10-CM | POA: Diagnosis present

## 2022-03-06 DIAGNOSIS — E039 Hypothyroidism, unspecified: Secondary | ICD-10-CM | POA: Diagnosis present

## 2022-03-06 DIAGNOSIS — I10 Essential (primary) hypertension: Secondary | ICD-10-CM | POA: Diagnosis not present

## 2022-03-06 DIAGNOSIS — I428 Other cardiomyopathies: Secondary | ICD-10-CM | POA: Diagnosis not present

## 2022-03-06 DIAGNOSIS — G40909 Epilepsy, unspecified, not intractable, without status epilepticus: Secondary | ICD-10-CM | POA: Diagnosis present

## 2022-03-06 DIAGNOSIS — I714 Abdominal aortic aneurysm, without rupture, unspecified: Secondary | ICD-10-CM | POA: Diagnosis present

## 2022-03-06 DIAGNOSIS — I2510 Atherosclerotic heart disease of native coronary artery without angina pectoris: Secondary | ICD-10-CM | POA: Diagnosis present

## 2022-03-06 DIAGNOSIS — G40901 Epilepsy, unspecified, not intractable, with status epilepticus: Secondary | ICD-10-CM | POA: Diagnosis not present

## 2022-03-06 DIAGNOSIS — F0390 Unspecified dementia without behavioral disturbance: Secondary | ICD-10-CM | POA: Diagnosis not present

## 2022-03-06 DIAGNOSIS — Z79899 Other long term (current) drug therapy: Secondary | ICD-10-CM

## 2022-03-06 DIAGNOSIS — R569 Unspecified convulsions: Secondary | ICD-10-CM | POA: Diagnosis present

## 2022-03-06 DIAGNOSIS — Z8249 Family history of ischemic heart disease and other diseases of the circulatory system: Secondary | ICD-10-CM

## 2022-03-06 DIAGNOSIS — G9341 Metabolic encephalopathy: Secondary | ICD-10-CM | POA: Diagnosis present

## 2022-03-06 DIAGNOSIS — F32A Depression, unspecified: Secondary | ICD-10-CM | POA: Diagnosis present

## 2022-03-06 DIAGNOSIS — Z91041 Radiographic dye allergy status: Secondary | ICD-10-CM

## 2022-03-06 DIAGNOSIS — J9611 Chronic respiratory failure with hypoxia: Secondary | ICD-10-CM | POA: Diagnosis present

## 2022-03-06 DIAGNOSIS — Z95810 Presence of automatic (implantable) cardiac defibrillator: Secondary | ICD-10-CM | POA: Diagnosis not present

## 2022-03-06 DIAGNOSIS — R42 Dizziness and giddiness: Secondary | ICD-10-CM | POA: Diagnosis present

## 2022-03-06 DIAGNOSIS — I4721 Torsades de pointes: Secondary | ICD-10-CM | POA: Diagnosis present

## 2022-03-06 DIAGNOSIS — F322 Major depressive disorder, single episode, severe without psychotic features: Secondary | ICD-10-CM | POA: Insufficient documentation

## 2022-03-06 DIAGNOSIS — Z8673 Personal history of transient ischemic attack (TIA), and cerebral infarction without residual deficits: Secondary | ICD-10-CM

## 2022-03-06 DIAGNOSIS — E876 Hypokalemia: Secondary | ICD-10-CM | POA: Diagnosis present

## 2022-03-06 DIAGNOSIS — I472 Ventricular tachycardia, unspecified: Secondary | ICD-10-CM | POA: Diagnosis present

## 2022-03-06 DIAGNOSIS — R531 Weakness: Secondary | ICD-10-CM | POA: Diagnosis present

## 2022-03-06 DIAGNOSIS — G4733 Obstructive sleep apnea (adult) (pediatric): Secondary | ICD-10-CM | POA: Diagnosis present

## 2022-03-06 DIAGNOSIS — R55 Syncope and collapse: Secondary | ICD-10-CM | POA: Diagnosis not present

## 2022-03-06 DIAGNOSIS — I4729 Other ventricular tachycardia: Secondary | ICD-10-CM | POA: Insufficient documentation

## 2022-03-06 DIAGNOSIS — I5022 Chronic systolic (congestive) heart failure: Secondary | ICD-10-CM | POA: Diagnosis present

## 2022-03-06 DIAGNOSIS — R110 Nausea: Secondary | ICD-10-CM | POA: Diagnosis not present

## 2022-03-06 DIAGNOSIS — R197 Diarrhea, unspecified: Secondary | ICD-10-CM | POA: Diagnosis not present

## 2022-03-06 DIAGNOSIS — I110 Hypertensive heart disease with heart failure: Secondary | ICD-10-CM | POA: Diagnosis present

## 2022-03-06 DIAGNOSIS — H9192 Unspecified hearing loss, left ear: Secondary | ICD-10-CM | POA: Diagnosis present

## 2022-03-06 DIAGNOSIS — I959 Hypotension, unspecified: Secondary | ICD-10-CM | POA: Diagnosis present

## 2022-03-06 DIAGNOSIS — Z87891 Personal history of nicotine dependence: Secondary | ICD-10-CM

## 2022-03-06 DIAGNOSIS — I420 Dilated cardiomyopathy: Secondary | ICD-10-CM | POA: Diagnosis present

## 2022-03-06 DIAGNOSIS — K219 Gastro-esophageal reflux disease without esophagitis: Secondary | ICD-10-CM | POA: Diagnosis present

## 2022-03-06 DIAGNOSIS — I272 Pulmonary hypertension, unspecified: Secondary | ICD-10-CM | POA: Diagnosis present

## 2022-03-06 DIAGNOSIS — J449 Chronic obstructive pulmonary disease, unspecified: Secondary | ICD-10-CM | POA: Diagnosis present

## 2022-03-06 DIAGNOSIS — I459 Conduction disorder, unspecified: Secondary | ICD-10-CM | POA: Diagnosis present

## 2022-03-06 DIAGNOSIS — R000 Tachycardia, unspecified: Secondary | ICD-10-CM | POA: Diagnosis not present

## 2022-03-06 DIAGNOSIS — M549 Dorsalgia, unspecified: Secondary | ICD-10-CM | POA: Diagnosis not present

## 2022-03-06 DIAGNOSIS — Z823 Family history of stroke: Secondary | ICD-10-CM

## 2022-03-06 DIAGNOSIS — Z885 Allergy status to narcotic agent status: Secondary | ICD-10-CM

## 2022-03-06 DIAGNOSIS — Z7951 Long term (current) use of inhaled steroids: Secondary | ICD-10-CM

## 2022-03-06 DIAGNOSIS — G40409 Other generalized epilepsy and epileptic syndromes, not intractable, without status epilepticus: Principal | ICD-10-CM | POA: Diagnosis present

## 2022-03-06 DIAGNOSIS — R0902 Hypoxemia: Secondary | ICD-10-CM | POA: Diagnosis not present

## 2022-03-06 DIAGNOSIS — Z7401 Bed confinement status: Secondary | ICD-10-CM | POA: Diagnosis not present

## 2022-03-06 DIAGNOSIS — Z803 Family history of malignant neoplasm of breast: Secondary | ICD-10-CM

## 2022-03-06 LAB — CBC WITH DIFFERENTIAL/PLATELET
Abs Immature Granulocytes: 0.06 10*3/uL (ref 0.00–0.07)
Basophils Absolute: 0.1 10*3/uL (ref 0.0–0.1)
Basophils Relative: 1 %
Eosinophils Absolute: 0.1 10*3/uL (ref 0.0–0.5)
Eosinophils Relative: 1 %
HCT: 36.6 % (ref 36.0–46.0)
Hemoglobin: 10.9 g/dL — ABNORMAL LOW (ref 12.0–15.0)
Immature Granulocytes: 1 %
Lymphocytes Relative: 10 %
Lymphs Abs: 1 10*3/uL (ref 0.7–4.0)
MCH: 24.8 pg — ABNORMAL LOW (ref 26.0–34.0)
MCHC: 29.8 g/dL — ABNORMAL LOW (ref 30.0–36.0)
MCV: 83.4 fL (ref 80.0–100.0)
Monocytes Absolute: 0.7 10*3/uL (ref 0.1–1.0)
Monocytes Relative: 8 %
Neutro Abs: 7.4 10*3/uL (ref 1.7–7.7)
Neutrophils Relative %: 79 %
Platelets: 275 10*3/uL (ref 150–400)
RBC: 4.39 MIL/uL (ref 3.87–5.11)
RDW: 18.5 % — ABNORMAL HIGH (ref 11.5–15.5)
WBC: 9.3 10*3/uL (ref 4.0–10.5)
nRBC: 0 % (ref 0.0–0.2)

## 2022-03-06 LAB — URINALYSIS, ROUTINE W REFLEX MICROSCOPIC
Bilirubin Urine: NEGATIVE
Glucose, UA: NEGATIVE mg/dL
Hgb urine dipstick: NEGATIVE
Ketones, ur: NEGATIVE mg/dL
Leukocytes,Ua: NEGATIVE
Nitrite: NEGATIVE
Protein, ur: 30 mg/dL — AB
Specific Gravity, Urine: 1.013 (ref 1.005–1.030)
pH: 5 (ref 5.0–8.0)

## 2022-03-06 LAB — COMPREHENSIVE METABOLIC PANEL
ALT: 8 U/L (ref 0–44)
AST: 32 U/L (ref 15–41)
Albumin: 2.3 g/dL — ABNORMAL LOW (ref 3.5–5.0)
Alkaline Phosphatase: 152 U/L — ABNORMAL HIGH (ref 38–126)
Anion gap: 16 — ABNORMAL HIGH (ref 5–15)
BUN: 15 mg/dL (ref 8–23)
CO2: 16 mmol/L — ABNORMAL LOW (ref 22–32)
Calcium: 6.2 mg/dL — CL (ref 8.9–10.3)
Chloride: 107 mmol/L (ref 98–111)
Creatinine, Ser: 1.13 mg/dL — ABNORMAL HIGH (ref 0.44–1.00)
GFR, Estimated: 52 mL/min — ABNORMAL LOW (ref >60)
Glucose, Bld: 198 mg/dL — ABNORMAL HIGH (ref 70–99)
Potassium: 3.8 mmol/L (ref 3.5–5.1)
Sodium: 139 mmol/L (ref 135–145)
Total Bilirubin: 0.7 mg/dL (ref 0.3–1.2)
Total Protein: 6.5 g/dL (ref 6.5–8.1)

## 2022-03-06 LAB — TROPONIN I (HIGH SENSITIVITY): Troponin I (High Sensitivity): 13 ng/L (ref <18)

## 2022-03-06 LAB — MAGNESIUM: Magnesium: 0.9 mg/dL — CL (ref 1.7–2.4)

## 2022-03-06 MED ORDER — MAGNESIUM SULFATE 2 GM/50ML IV SOLN
2.0000 g | Freq: Once | INTRAVENOUS | Status: AC
  Administered 2022-03-06: 2 g via INTRAVENOUS
  Filled 2022-03-06: qty 50

## 2022-03-06 MED ORDER — POTASSIUM CHLORIDE CRYS ER 20 MEQ PO TBCR: 40.0000 meq | EXTENDED_RELEASE_TABLET | Freq: Once | ORAL | Status: DC

## 2022-03-06 MED ORDER — LEVETIRACETAM IN NACL 1000 MG/100ML IV SOLN
1000.0000 mg | Freq: Once | INTRAVENOUS | Status: AC
  Administered 2022-03-06: 1000 mg via INTRAVENOUS
  Filled 2022-03-06: qty 100

## 2022-03-06 MED ORDER — CALCIUM GLUCONATE-NACL 1-0.675 GM/50ML-% IV SOLN
1.0000 g | Freq: Once | INTRAVENOUS | Status: AC
  Administered 2022-03-06: 1000 mg via INTRAVENOUS
  Filled 2022-03-06: qty 50

## 2022-03-06 NOTE — ED Notes (Signed)
Patient transported to CT 

## 2022-03-06 NOTE — ED Triage Notes (Signed)
Pt to ED via EMS from home c/o AMS around 2100 tonight.  Pt arrives alert to voice but not responding verbally.  Per EMS husband saw pt having shaking activity on home camera system, was at baseline mentally prior to event, en route pt had 1-2 min grand mal seizure and given 5mg  IM versed, vitals 160/104 BP, 180 CBG, 3L [place] baseline, hx of pacemaker and per husband takes seizure medications but no hx of seizures.  Pt moving arms on arrival but not cooperative.  Dr. Charna Archer at bedside on arrival.

## 2022-03-06 NOTE — ED Notes (Signed)
X-ray at bedside

## 2022-03-06 NOTE — ED Provider Notes (Signed)
[facility] Provider Note    Event Date/Time   First MD Initiated Contact with Patient 03/06/22 2200     (approximate)   History   Chief Complaint Altered Mental Status   HPI  Stacy Grimes is a 72 y.o. female with past medical history of hypertension, diabetes, systolic CHF, dementia, and seizures who presents to the ED for altered mental status.  Per EMS, patient's husband noted that she was acting erratically just prior to arrival, but reported he had seen her acting normal about an hour before that.  Patient is alert and oriented x 4 at baseline per husband.  When EMS arrived, they found patient awake and alert, but having difficulty answering questions and with intermittent shaking activity that did not seem consistent with seizure.  Once she was loaded into the ambulance, she had a grand mall seizure lasting 1 to 2 minutes, stopped after she was given 5 mg of IM Versed.  Husband was a poor historian per EMS, reported patient does not have a history of seizures but takes seizure medication.  It is unclear what medication she takes.     Physical Exam   Triage Vital Signs: ED Triage Vitals  Enc Vitals Group     BP      Pulse      Resp      Temp      Temp src      SpO2      Weight      Height      Head Circumference      Peak Flow      Pain Score      Pain Loc      Pain Edu?      Excl. in [place]?     Most recent vital signs: Vitals:   03/06/22 2206 03/06/22 2239  BP: 114/78 110/61  Pulse: (!) 109 98  Resp: 20 19  Temp: 98.8 F (37.1 C)   SpO2: 98% 100%    Constitutional: Somnolent but arousable to painful stimuli. Eyes: Conjunctivae are normal.  Pupils equal, round, and reactive to light bilaterally. Head: Atraumatic. Nose: No congestion/rhinnorhea. Mouth/Throat: Mucous membranes are moist.  Cardiovascular: Normal rate, regular rhythm. Grossly normal heart sounds.  2+ radial pulses bilaterally. Respiratory: Normal respiratory  effort.  No retractions. Lungs CTAB. Gastrointestinal: Soft and nontender. No distention. Musculoskeletal: No lower extremity tenderness nor edema.  Neurologic: No verbal response noted, spontaneous movements noted in all 4 extremities.    ED Results / Procedures / Treatments   Labs (all labs ordered are listed, but only abnormal results are displayed) Labs Reviewed  CBC WITH DIFFERENTIAL/PLATELET - Abnormal; Notable for the following components:      Result Value   Hemoglobin 10.9 (*)    MCH 24.8 (*)    MCHC 29.8 (*)    RDW 18.5 (*)    All other components within normal limits  COMPREHENSIVE METABOLIC PANEL - Abnormal; Notable for the following components:   CO2 16 (*)    Glucose, Bld 198 (*)    Creatinine, Ser 1.13 (*)    Calcium 6.2 (*)    Albumin 2.3 (*)    Alkaline Phosphatase 152 (*)    GFR, Estimated 52 (*)    Anion gap 16 (*)    All other components within normal limits  MAGNESIUM - Abnormal; Notable for the following components:   Magnesium 0.9 (*)    All other components within normal limits  URINALYSIS, ROUTINE  W REFLEX MICROSCOPIC  TROPONIN I (HIGH SENSITIVITY)     EKG  ED ECG REPORT I, Blake Divine, the attending physician, personally viewed and interpreted this ECG.   Date: 03/06/2022  EKG Time: 22:01  Rate: 114  Rhythm: sinus tachycardia  Axis: Normal  Intervals:nonspecific intraventricular conduction delay  ST&T Change: None  RADIOLOGY CT head reviewed and interpreted by me with no hemorrhage or midline shift.  Chest x-ray reviewed and interpreted by me with no infiltrate, edema, or effusion.  PROCEDURES:  Critical Care performed: Yes, see critical care procedure note(s)  .Critical Care  Performed by: Blake Divine, MD Authorized by: Blake Divine, MD   Critical care provider statement:    Critical care time (minutes):  30   Critical care time was exclusive of:  Separately billable procedures and treating other patients and teaching  time   Critical care was necessary to treat or prevent imminent or life-threatening deterioration of the following conditions:  Metabolic crisis   Critical care was time spent personally by me on the following activities:  Development of treatment plan with patient or surrogate, discussions with consultants, evaluation of patient's response to treatment, examination of patient, ordering and review of laboratory studies, ordering and review of radiographic studies, ordering and performing treatments and interventions, pulse oximetry, re-evaluation of patient's condition and review of old charts   I assumed direction of critical care for this patient from another provider in my specialty: no     Care discussed with: admitting provider      MEDICATIONS ORDERED IN ED: Medications  magnesium sulfate IVPB 2 g 50 mL (has no administration in time range)  calcium gluconate 1 g/ 50 mL sodium chloride IVPB (has no administration in time range)  levETIRAcetam (KEPPRA) IVPB 1000 mg/100 mL premix (0 mg Intravenous Stopped 03/06/22 2250)     IMPRESSION / MDM / [place] / ED COURSE  I reviewed the triage vital signs and the nursing notes.                              72 y.o. female with past medical history of hypertension, diabetes, CHF, chronic hypoxic respiratory failure on 3 L nasal cannula, dementia, and seizures who presents to the ED for altered mental status, subsequently had generalized tonic-clonic seizure with EMS.  Patient's presentation is most consistent with acute presentation with potential threat to life or bodily function.  Differential diagnosis includes, but is not limited to, seizure, status epilepticus, intracranial process, electrolyte abnormality, medication noncompliance, UTI, pneumonia.  Patient somnolent on arrival but arousable to painful stimuli and moves all 4 extremities spontaneously, pupils equal and reactive to light bilaterally.  We will check CT head given  her change in mental status, does appear ongoing somnolence due to postictal state.  Reviewing her chart, it seems she was recently taken off of Keppra due to concern for diarrhea, will give her usual dose of 1 g IV Keppra for now.  EKG shows no evidence of arrhythmia or ischemia, labs are pending.  Reviewing patient's chart, episode in December found to be likely related to VT/VF and patient with significant electrolyte abnormalities at that time.  Labs today are remarkable for hypocalcemia and severe hypomagnesemia, which we will replete.  Patient also with borderline low potassium, will give supplemental potassium to raise this level above 4.  No significant anemia or leukocytosis noted, troponin within normal limits.  Renal function stable compared to previous.  We will interrogate her pacemaker and case discussed with hospitalist for admission.      FINAL CLINICAL IMPRESSION(S) / ED DIAGNOSES   Final diagnoses:  Altered mental status, unspecified altered mental status type  Seizure-like activity ([place])  Hypomagnesemia  Hypocalcemia     Rx / DC Orders   ED Discharge Orders     None        Note:  This document was prepared using Dragon voice recognition software and may include unintentional dictation errors.   Blake Divine, MD 03/06/22 [phone]

## 2022-03-07 ENCOUNTER — Other Ambulatory Visit: Payer: Self-pay

## 2022-03-07 ENCOUNTER — Telehealth: Payer: Self-pay | Admitting: Cardiovascular Disease

## 2022-03-07 ENCOUNTER — Telehealth: Payer: Self-pay | Admitting: Internal Medicine

## 2022-03-07 ENCOUNTER — Encounter: Payer: Self-pay | Admitting: Internal Medicine

## 2022-03-07 DIAGNOSIS — E876 Hypokalemia: Secondary | ICD-10-CM | POA: Diagnosis present

## 2022-03-07 DIAGNOSIS — R4182 Altered mental status, unspecified: Secondary | ICD-10-CM

## 2022-03-07 DIAGNOSIS — R569 Unspecified convulsions: Secondary | ICD-10-CM

## 2022-03-07 DIAGNOSIS — I4721 Torsades de pointes: Secondary | ICD-10-CM | POA: Diagnosis present

## 2022-03-07 DIAGNOSIS — F0390 Unspecified dementia without behavioral disturbance: Secondary | ICD-10-CM | POA: Diagnosis not present

## 2022-03-07 DIAGNOSIS — E039 Hypothyroidism, unspecified: Secondary | ICD-10-CM | POA: Insufficient documentation

## 2022-03-07 DIAGNOSIS — J9611 Chronic respiratory failure with hypoxia: Secondary | ICD-10-CM

## 2022-03-07 DIAGNOSIS — I5022 Chronic systolic (congestive) heart failure: Secondary | ICD-10-CM

## 2022-03-07 DIAGNOSIS — F0393 Unspecified dementia, unspecified severity, with mood disturbance: Secondary | ICD-10-CM | POA: Diagnosis present

## 2022-03-07 DIAGNOSIS — F03918 Unspecified dementia, unspecified severity, with other behavioral disturbance: Secondary | ICD-10-CM | POA: Diagnosis present

## 2022-03-07 DIAGNOSIS — R531 Weakness: Secondary | ICD-10-CM | POA: Diagnosis not present

## 2022-03-07 DIAGNOSIS — I272 Pulmonary hypertension, unspecified: Secondary | ICD-10-CM | POA: Diagnosis present

## 2022-03-07 DIAGNOSIS — Z95810 Presence of automatic (implantable) cardiac defibrillator: Secondary | ICD-10-CM

## 2022-03-07 DIAGNOSIS — J449 Chronic obstructive pulmonary disease, unspecified: Secondary | ICD-10-CM

## 2022-03-07 DIAGNOSIS — G9341 Metabolic encephalopathy: Secondary | ICD-10-CM

## 2022-03-07 DIAGNOSIS — I714 Abdominal aortic aneurysm, without rupture, unspecified: Secondary | ICD-10-CM | POA: Diagnosis present

## 2022-03-07 DIAGNOSIS — I4729 Other ventricular tachycardia: Secondary | ICD-10-CM | POA: Diagnosis not present

## 2022-03-07 DIAGNOSIS — I472 Ventricular tachycardia, unspecified: Secondary | ICD-10-CM | POA: Diagnosis present

## 2022-03-07 DIAGNOSIS — G40919 Epilepsy, unspecified, intractable, without status epilepticus: Secondary | ICD-10-CM | POA: Diagnosis not present

## 2022-03-07 DIAGNOSIS — I428 Other cardiomyopathies: Secondary | ICD-10-CM | POA: Diagnosis not present

## 2022-03-07 DIAGNOSIS — E119 Type 2 diabetes mellitus without complications: Secondary | ICD-10-CM | POA: Diagnosis present

## 2022-03-07 DIAGNOSIS — I10 Essential (primary) hypertension: Secondary | ICD-10-CM | POA: Diagnosis not present

## 2022-03-07 DIAGNOSIS — G40409 Other generalized epilepsy and epileptic syndromes, not intractable, without status epilepticus: Secondary | ICD-10-CM | POA: Diagnosis present

## 2022-03-07 DIAGNOSIS — I420 Dilated cardiomyopathy: Secondary | ICD-10-CM | POA: Diagnosis present

## 2022-03-07 DIAGNOSIS — F32A Depression, unspecified: Secondary | ICD-10-CM | POA: Diagnosis present

## 2022-03-07 DIAGNOSIS — F322 Major depressive disorder, single episode, severe without psychotic features: Secondary | ICD-10-CM | POA: Insufficient documentation

## 2022-03-07 DIAGNOSIS — I2510 Atherosclerotic heart disease of native coronary artery without angina pectoris: Secondary | ICD-10-CM | POA: Diagnosis present

## 2022-03-07 DIAGNOSIS — R197 Diarrhea, unspecified: Secondary | ICD-10-CM | POA: Insufficient documentation

## 2022-03-07 DIAGNOSIS — R110 Nausea: Secondary | ICD-10-CM | POA: Diagnosis not present

## 2022-03-07 DIAGNOSIS — Z9981 Dependence on supplemental oxygen: Secondary | ICD-10-CM | POA: Diagnosis not present

## 2022-03-07 DIAGNOSIS — I110 Hypertensive heart disease with heart failure: Secondary | ICD-10-CM | POA: Diagnosis present

## 2022-03-07 DIAGNOSIS — I959 Hypotension, unspecified: Secondary | ICD-10-CM | POA: Diagnosis present

## 2022-03-07 DIAGNOSIS — Z87891 Personal history of nicotine dependence: Secondary | ICD-10-CM | POA: Diagnosis not present

## 2022-03-07 DIAGNOSIS — G40909 Epilepsy, unspecified, not intractable, without status epilepticus: Secondary | ICD-10-CM | POA: Diagnosis present

## 2022-03-07 LAB — COMPREHENSIVE METABOLIC PANEL
ALT: 9 U/L (ref 0–44)
AST: 17 U/L (ref 15–41)
Albumin: 2.5 g/dL — ABNORMAL LOW (ref 3.5–5.0)
Alkaline Phosphatase: 162 U/L — ABNORMAL HIGH (ref 38–126)
Anion gap: 9 (ref 5–15)
BUN: 18 mg/dL (ref 8–23)
CO2: 24 mmol/L (ref 22–32)
Calcium: 7.2 mg/dL — ABNORMAL LOW (ref 8.9–10.3)
Chloride: 106 mmol/L (ref 98–111)
Creatinine, Ser: 0.95 mg/dL (ref 0.44–1.00)
GFR, Estimated: >60 mL/min (ref >60)
Glucose, Bld: 82 mg/dL (ref 70–99)
Potassium: 3.2 mmol/L — ABNORMAL LOW (ref 3.5–5.1)
Sodium: 139 mmol/L (ref 135–145)
Total Bilirubin: 0.3 mg/dL (ref 0.3–1.2)
Total Protein: 6.9 g/dL (ref 6.5–8.1)

## 2022-03-07 LAB — MAGNESIUM: Magnesium: 1.7 mg/dL (ref 1.7–2.4)

## 2022-03-07 LAB — TROPONIN I (HIGH SENSITIVITY): Troponin I (High Sensitivity): 14 ng/L (ref <18)

## 2022-03-07 MED ORDER — ACETAMINOPHEN 325 MG PO TABS: 650.0000 mg | ORAL_TABLET | ORAL | Status: DC | PRN

## 2022-03-07 MED ORDER — DIVALPROEX SODIUM 250 MG PO DR TAB: 250.0000 mg | DELAYED_RELEASE_TABLET | Freq: Two times a day (BID) | ORAL | Status: DC

## 2022-03-07 MED ORDER — ENOXAPARIN SODIUM 40 MG/0.4ML IJ SOSY
40.0000 mg | PREFILLED_SYRINGE | INTRAMUSCULAR | Status: DC
  Administered 2022-03-07 – 2022-03-10 (×4): 40 mg via SUBCUTANEOUS
  Filled 2022-03-07 (×4): qty 0.4

## 2022-03-07 MED ORDER — ALBUTEROL SULFATE (2.5 MG/3ML) 0.083% IN NEBU: 3.0000 mL | INHALATION_SOLUTION | Freq: Four times a day (QID) | RESPIRATORY_TRACT | Status: DC | PRN

## 2022-03-07 MED ORDER — ONDANSETRON HCL 4 MG/2ML IJ SOLN: 4.0000 mg | Freq: Four times a day (QID) | INTRAMUSCULAR | Status: DC | PRN

## 2022-03-07 MED ORDER — ONDANSETRON HCL 4 MG PO TABS: 4.0000 mg | ORAL_TABLET | Freq: Four times a day (QID) | ORAL | Status: DC | PRN

## 2022-03-07 MED ORDER — MEMANTINE HCL 5 MG PO TABS
10.0000 mg | ORAL_TABLET | Freq: Two times a day (BID) | ORAL | Status: DC
  Administered 2022-03-07 – 2022-03-10 (×8): 10 mg via ORAL
  Filled 2022-03-07 (×8): qty 2

## 2022-03-07 MED ORDER — ORAL CARE MOUTH RINSE: 15.0000 mL | OROMUCOSAL | Status: DC | PRN

## 2022-03-07 MED ORDER — MIRTAZAPINE 15 MG PO TABS
7.5000 mg | ORAL_TABLET | Freq: Two times a day (BID) | ORAL | Status: DC
  Administered 2022-03-07 – 2022-03-10 (×7): 7.5 mg via ORAL
  Filled 2022-03-07 (×7): qty 1

## 2022-03-07 MED ORDER — LEVETIRACETAM IN NACL 1000 MG/100ML IV SOLN
1000.0000 mg | Freq: Two times a day (BID) | INTRAVENOUS | Status: DC
  Administered 2022-03-07: 1000 mg via INTRAVENOUS
  Filled 2022-03-07: qty 100

## 2022-03-07 MED ORDER — ACETAMINOPHEN 650 MG RE SUPP: 650.0000 mg | RECTAL | Status: DC | PRN

## 2022-03-07 MED ORDER — VALPROATE SODIUM 100 MG/ML IV SOLN
1000.0000 mg | Freq: Once | INTRAVENOUS | Status: AC
  Administered 2022-03-07: 1000 mg via INTRAVENOUS
  Filled 2022-03-07: qty 10

## 2022-03-07 MED ORDER — CLOPIDOGREL BISULFATE 75 MG PO TABS
75.0000 mg | ORAL_TABLET | Freq: Every day | ORAL | Status: DC
  Administered 2022-03-07 – 2022-03-10 (×4): 75 mg via ORAL
  Filled 2022-03-07 (×4): qty 1

## 2022-03-07 MED ORDER — ORAL CARE MOUTH RINSE
15.0000 mL | OROMUCOSAL | Status: DC
  Administered 2022-03-07 (×3): 15 mL via OROMUCOSAL
  Filled 2022-03-07 (×10): qty 15

## 2022-03-07 MED ORDER — POTASSIUM CHLORIDE 10 MEQ/100ML IV SOLN
10.0000 meq | INTRAVENOUS | Status: AC
  Administered 2022-03-07 (×2): 10 meq via INTRAVENOUS
  Filled 2022-03-07: qty 100

## 2022-03-07 MED ORDER — SODIUM CHLORIDE 0.9 % IV SOLN: INTRAVENOUS | Status: DC

## 2022-03-07 MED ORDER — MOMETASONE FURO-FORMOTEROL FUM 200-5 MCG/ACT IN AERO
2.0000 | INHALATION_SPRAY | Freq: Two times a day (BID) | RESPIRATORY_TRACT | Status: DC
  Administered 2022-03-08 – 2022-03-10 (×5): 2 via RESPIRATORY_TRACT
  Filled 2022-03-07: qty 8.8

## 2022-03-07 MED ORDER — MOMETASONE FURO-FORMOTEROL FUM 200-5 MCG/ACT IN AERO
2.0000 | INHALATION_SPRAY | Freq: Two times a day (BID) | RESPIRATORY_TRACT | Status: DC
  Filled 2022-03-07: qty 8.8

## 2022-03-07 MED ORDER — LEVETIRACETAM 500 MG PO TABS
1000.0000 mg | ORAL_TABLET | Freq: Two times a day (BID) | ORAL | Status: DC
  Filled 2022-03-07: qty 2

## 2022-03-07 MED ORDER — DONEPEZIL HCL 5 MG PO TABS
10.0000 mg | ORAL_TABLET | Freq: Every day | ORAL | Status: DC
  Administered 2022-03-07 – 2022-03-09 (×4): 10 mg via ORAL
  Filled 2022-03-07 (×4): qty 2

## 2022-03-07 MED ORDER — HALOPERIDOL LACTATE 5 MG/ML IJ SOLN
1.0000 mg | Freq: Four times a day (QID) | INTRAMUSCULAR | Status: DC | PRN
  Administered 2022-03-07: 1 mg via INTRAVENOUS
  Filled 2022-03-07: qty 1

## 2022-03-07 MED ORDER — ATORVASTATIN CALCIUM 20 MG PO TABS
80.0000 mg | ORAL_TABLET | Freq: Every day | ORAL | Status: DC
  Administered 2022-03-07 – 2022-03-10 (×4): 80 mg via ORAL
  Filled 2022-03-07 (×4): qty 4

## 2022-03-07 MED ORDER — DIVALPROEX SODIUM 125 MG PO CSDR
250.0000 mg | DELAYED_RELEASE_CAPSULE | Freq: Two times a day (BID) | ORAL | Status: DC
  Administered 2022-03-08 – 2022-03-10 (×5): 250 mg via ORAL
  Filled 2022-03-07 (×7): qty 2

## 2022-03-07 MED ORDER — MAGNESIUM SULFATE 2 GM/50ML IV SOLN
2.0000 g | Freq: Once | INTRAVENOUS | Status: AC
  Administered 2022-03-07: 2 g via INTRAVENOUS
  Filled 2022-03-07: qty 50

## 2022-03-07 MED ORDER — VENLAFAXINE HCL ER 75 MG PO CP24
75.0000 mg | ORAL_CAPSULE | Freq: Two times a day (BID) | ORAL | Status: DC
  Administered 2022-03-07 – 2022-03-10 (×7): 75 mg via ORAL
  Filled 2022-03-07 (×7): qty 1

## 2022-03-07 MED ORDER — DIGOXIN 125 MCG PO TABS
0.1250 mg | ORAL_TABLET | Freq: Every day | ORAL | Status: DC
  Administered 2022-03-07 – 2022-03-10 (×4): 0.125 mg via ORAL
  Filled 2022-03-07 (×4): qty 1

## 2022-03-07 MED ORDER — GABAPENTIN 300 MG PO CAPS
300.0000 mg | ORAL_CAPSULE | Freq: Two times a day (BID) | ORAL | Status: DC | PRN
  Administered 2022-03-07 – 2022-03-08 (×2): 300 mg via ORAL
  Filled 2022-03-07 (×2): qty 1

## 2022-03-07 MED ORDER — POTASSIUM CHLORIDE 10 MEQ/100ML IV SOLN
10.0000 meq | Freq: Once | INTRAVENOUS | Status: DC
  Filled 2022-03-07: qty 100

## 2022-03-07 MED ORDER — LEVETIRACETAM IN NACL 500 MG/100ML IV SOLN: 500.0000 mg | Freq: Two times a day (BID) | INTRAVENOUS | Status: DC

## 2022-03-07 MED ORDER — SODIUM CHLORIDE 0.9 % IV SOLN
75.0000 mL/h | INTRAVENOUS | Status: DC
  Administered 2022-03-07: 75 mL/h via INTRAVENOUS

## 2022-03-07 MED ORDER — LORAZEPAM 2 MG/ML IJ SOLN: 0.5000 mg | Freq: Three times a day (TID) | INTRAMUSCULAR | Status: DC | PRN

## 2022-03-07 MED ORDER — POTASSIUM CHLORIDE 10 MEQ/100ML IV SOLN
10.0000 meq | INTRAVENOUS | Status: AC
  Administered 2022-03-07: 10 meq via INTRAVENOUS
  Filled 2022-03-07: qty 100

## 2022-03-07 MED ORDER — LEVOTHYROXINE SODIUM 100 MCG PO TABS
100.0000 ug | ORAL_TABLET | Freq: Every day | ORAL | Status: DC
  Administered 2022-03-07 – 2022-03-10 (×4): 100 ug via ORAL
  Filled 2022-03-07: qty 1
  Filled 2022-03-07: qty 2
  Filled 2022-03-07 (×2): qty 1

## 2022-03-07 NOTE — Progress Notes (Signed)
Progress Note   Patient: Stacy Grimes ZOX:[id number] DOB: 05-23-1950 DOA: 03/06/2022     0 DOS: the patient was seen and examined on 03/07/2022   Brief hospital course:  72 y.o. female with medical history significant for dementia and prior CVA, COPD on home O2 at 3 L, CAD, systolic CHF secondary to dilated cardiomyopathy s/p AICD, OSA, hypothyroidism, HTN, anxiety, diabetes, hospitalized from 12/5 to 01/17/2022 with status epilepticus complicated by polypharmacy, requiring ventilator support, improving with the discontinuation of several medications, who presents to the ED.ED for altered mental status and seizure-like activity that started an hour prior to arrival of EMS.  On arrival of EMS she was awake and alert but appeared slow to respond to questions and had intermittent shaking.  After being loaded into the ambulance she went on to have a grand mal seizure lasting 1 to 2 minutes aborted with IM Versed. ED course and data review: Vitals unremarkable in the ED.  Labs notable for hypocalcemia of 6.2 and hypomagnesemia of 0.9.  Creatinine was at baseline at 1.13 but with anion gap of 16 and bicarb of 16.  EKG, personally viewed and interpreted showing sinus tachycardia at 114 with nonspecific ST-T wave changes.  CT head showed no acute intracranial abnormality and chest x-ray with no active disease. Patient was loaded with Keppra 1 g given calcium gluconate 1 g, magnesium sulfate 2 g as well as oral potassium.  Hospitalist consulted for admission for seizure altered mental status..  Assessment and Plan: * Breakthrough seizure ([place]) Will get neuro consultation.  EEG.  IV Keppra for right now.  Husband states that the [place] has caused diarrhea and they have cut back the dose with Dr. Melrose Nakayama.  Acute metabolic encephalopathy Likely secondary to seizure.  Patient able to follow commands this morning.  Hypomagnesemia Replace another 2 g of IV magnesium today  Hypokalemia Replace potassium orally  and IV.  Hypocalcemia Received calcium gluconate Monitor and replete as needed  Chronic systolic CHF (congestive heart failure) (HCC) Dilated cardiomyopathy s/p AICD [facility] Jude) Clinically euvolemic Last EF 40 to 45% September 2023 Hold losartan, metoprolol, and for, due to soft blood pressures Continue digoxin  Chronic obstructive pulmonary disease, unspecified COPD type (HCC) Chronic respiratory failure on 3 L Not acutely exacerbated and home O2 requirement at baseline Continue home inhalers with DuoNebs as needed  Dementia (HCC) Continue donepezil, mirtazapine and memantine  Diarrhea Send off stool studies to see if infectious cause rather than from seizure medication.  Depression Continue Effexor  AAA (abdominal aortic aneurysm) (HCC) No acute issues suspected  Hypothyroidism Continue levothyroxine  Hypertension Borderline hypotension BP 106/56 Will hold losartan, metoprolol and furosemide tonight and resume as appropriate        Subjective: Patient doesn't feel well but can't describe what she is feeling. Brought in with possible seizure. Husband thinks it maybe her heart.  With Dr. Melrose Nakayama and the husband may cut back on the Keppra secondary to diarrhea.  Physical Exam: Vitals:   03/07/22 0636 03/07/22 0800 03/07/22 0943 03/07/22 1000  BP: (!) 142/67 113/64    Pulse: 89 74 77   Resp: 19 17    Temp:    98.9 F (37.2 C)  TempSrc:    Axillary  SpO2: 90% 94%    Weight:      Height:       Physical Exam HENT:     Head: Normocephalic.     Mouth/Throat:     Pharynx: No oropharyngeal exudate.  Eyes:  General: Lids are normal.     Conjunctiva/sclera: Conjunctivae normal.  Cardiovascular:     Rate and Rhythm: Normal rate and regular rhythm.     Heart sounds: Normal heart sounds, S1 normal and S2 normal.  Pulmonary:     Breath sounds: No decreased breath sounds, wheezing, rhonchi or rales.  Abdominal:     Palpations: Abdomen is soft.      Tenderness: There is no abdominal tenderness.  Musculoskeletal:     Right lower leg: No swelling.     Left lower leg: No swelling.  Skin:    General: Skin is warm.     Findings: No rash.  Neurological:     Mental Status: She is alert.     Data Reviewed: Potassium 3.2, magnesium 1.7  Family Communication: 20-minute conversation with patient's husband on the phone  Disposition: Status is: Observation Will have cardiology and neurology see the patient to adjust medications.  Speech therapy did place on a diet will reevaluate tomorrow to see if diet can be advanced further.  Planned Discharge Destination: Home with Home Health    Time spent: 45 minutes Author: Loletha Grayer, MD 03/07/2022 11:42 AM  For on call review [email or web address].

## 2022-03-07 NOTE — Assessment & Plan Note (Addendum)
Dilated cardiomyopathy s/p AICD [facility] Jude) Clinically euvolemic Last EF 40 to 45% September 2023 Restart low-dose Toprol at night Continue digoxin

## 2022-03-07 NOTE — Consult Note (Addendum)
Cardiology Consult    Patient ID: Stacy Grimes MRN: [id number], DOB/AGE: 73-Mar-1952   Admit date: 03/06/2022 Date of Consult: 03/07/2022  Primary Physician: Stacy Bender, PA-C Primary Cardiologist: Stacy Sacramento, MD/EP: Stacy Pia, MD  Requesting Provider: R. Leslye Peer, MD  Patient Profile    Stacy Grimes is a 72 y.o. female with a history of nonischemic cardiomyopathy, chronic HFrEF, pulmonary hypertension, nonobstructive CAD, hypertension, COPD (supplemental oxygen dependent), hypothyroidism, abdominal aortic aneurysm, sleep apnea, orthostatic hypotension, stroke, and dementia, who is being seen today for the evaluation of seizures with concern for arrhythmia at the request of Dr. Leslye Grimes.  Past Medical History   Past Medical History:  Diagnosis Date   AAA (abdominal aortic aneurysm) (HCC)    Anginal pain ([place])    Anxiety    Arthritis    Automatic implantable cardioverter-defibrillator in situ    Biventricular ICD  [place]    a.  2011 s/p SJM CRT-D; b. 09/2016 gen change: SJM BiV AICD (ser# [id number]).   COPD (chronic obstructive pulmonary disease) (HCC)    Deafness    left ear   Dementia ([place])    Depression    Dizziness    Fracture    history of spinal fracture   GERD (gastroesophageal reflux disease)    HFrEF (heart failure with reduced ejection fraction) ([place])    a. 2010 Cath: nonobs dzs, EF 15-20%; b. 09/2017 Echo: EF suboptimal. EF low nl; c. 04/2018 Echo: EF 40-45%; d. 10/2021 Echo: EF 40-45%, glob HK, mildly red RV fxn, sev elev PASP, triv MR, mild-mod TR.   Hypertension    Hypothyroidism    Non-ischemic cardiomyopathy ([place])    a. 2010 Cath: nonobs dzs, EF 15-20%; b. 2011 s/p SJM CRT-D; c. 09/2016 s/p Gen change; d. 09/2017 Echo: EF suboptimal. EF low nl; e. 04/2018 Echo: EF 40-45%; f. 10/2021 Echo: EF 40-45%, glob HK, mildly red RV fxn, sev elev PASP, triv MR, mild-mod TR.   Oxygen dependent    Palpitations    Sleep apnea     Past Surgical History:  Procedure  Laterality Date   ABDOMINAL HYSTERECTOMY     BIV ICD GENERATOR CHANGEOUT N/A 09/17/2016   Procedure: BiV ICD Generator Changeout;  Surgeon: Deboraha Sprang, MD;  Location: [facility];  Service: Cardiovascular;  Laterality: N/A;   BLADDER SURGERY     CARDIAC CATHETERIZATION     CARDIAC DEFIBRILLATOR PLACEMENT  4 yrs ago   pacemaker   CATARACT EXTRACTION W/PHACO Left 09/01/2014   Procedure: CATARACT EXTRACTION PHACO AND INTRAOCULAR LENS PLACEMENT ([place]);  Surgeon: Lyla Glassing, MD;  Location: [facility];  Service: Ophthalmology;  Laterality: Left;  [place]: 1:12.1    CATARACT EXTRACTION W/PHACO Right 09/29/2014   Procedure: CATARACT EXTRACTION PHACO AND INTRAOCULAR LENS PLACEMENT (IOC);  Surgeon: Lyla Glassing, MD;  Location: [facility];  Service: Ophthalmology;  Laterality: Right;  [place]: 00:52.7    CHOLECYSTECTOMY     COLON SURGERY     COLONOSCOPY     COLONOSCOPY N/A 08/31/2012   Procedure: COLONOSCOPY;  Surgeon: Inda Castle, MD;  Location: [facility];  Service: Endoscopy;  Laterality: N/A;   ESOPHAGOGASTRODUODENOSCOPY N/A 07/06/2012   Procedure: ESOPHAGOGASTRODUODENOSCOPY (EGD);  Surgeon: Lafayette Dragon, MD;  Location: [facility];  Service: Endoscopy;  Laterality: N/A;   INTRAMEDULLARY (IM) NAIL INTERTROCHANTERIC Right 10/23/2021   Procedure: INTRAMEDULLARY (IM) NAIL INTERTROCHANTERIC;  Surgeon: Corky Mull, MD;  Location: [facility];  Service: Orthopedics;  Laterality: Right;   KNEE SURGERY Right  MIDDLE EAR SURGERY     SAVORY DILATION N/A 07/06/2012   Procedure: SAVORY DILATION;  Surgeon: Lafayette Dragon, MD;  Location: [facility];  Service: Endoscopy;  Laterality: N/A;   TEE WITHOUT CARDIOVERSION N/A 06/23/2020   Procedure: TRANSESOPHAGEAL ECHOCARDIOGRAM (TEE);  Surgeon: Minna Merritts, MD;  Location: [facility];  Service: Cardiovascular;  Laterality: N/A;   WRIST SURGERY Right      Allergies  Allergies  Allergen Reactions   Codeine Palpitations   Contrast Media [Iodinated  Contrast Media] Rash    History of Present Illness    72 y.o. female with a history of nonischemic cardiomyopathy, chronic HFrEF, pulmonary hypertension, nonobstructive CAD, hypertension, COPD (supplemental oxygen dependent), hypothyroidism, abdominal aortic aneurysm, sleep apnea, orthostatic hypotension, stroke, and dementia.  She previously underwent diagnostic catheterization 2010, which showed nonobstructive CAD with an EF of 15 to 20%.  She underwent biventricular ICD placement in 2011 with some improvement in LV function there after.  She underwent ICD generator change in 2018 and currently has a [place] Jude's biventricular device.  She has been managed with dual antiplatelet therapy in the setting of prior stroke.  Historically, GDMT has been limited by low blood pressures and orthostasis.  Most recent echo in September 2023 showed an EF of 40 to 45% with global hypokinesis and severely elevated PASP.  Stacy Grimes was hospitalized at [facility] in December 2023 in the setting of seizures.  She was placed on Keppra during hospitalization.  There was initially concern for wide-complex tachycardia on admission and she was evaluated by our electrophysiology team who reviewed her strips and noted that she was in a sinus rhythm throughout.  Her hospitalization was complicated by polypharmacy and respiratory failure requiring ventilator support.  Following discharge, our office received a device alert related to torsade the pointes that occurred during her hospital stay on January 14, 2022, with 2 episodes of failed ATP in 2 successful defibrillations.  Upon receiving notification that this had occurred, our office requested that she have follow-up lab work, as it was noted that she was hypokalemic and hypomagnesemic on the day of the event occurred.  Patient subsequently had follow-up labs with primary care.  Currently, Stacy Grimes is agitated and unable to participate in this interview.  No family is  available to speak.  Per notes, on the evening of January 31, she began to have seizure-like activity with alteration in mental status.  EMS was called.  And round, EMS reported a 1 to 2-minute grand mal seizure she was treated with intramuscular Versed.  In the emergency department, she was noted to be hypocalcemic with a calcium of 6.2, and hypomagnesemic - 0.9.  Head CT showed no acute intracranial abnormality.  Chest x-ray with no active disease.  EKG shows sinus tachycardia at 114 bpm and an IVCD.  Her device was interrogated and shows no episodes of VT/VF, or antitachycardia pacing/defibrillations.  We have been asked to evaluate given husband's concern that arrhythmia might be contributing to presentation.  Inpatient Medications     atorvastatin  80 mg Oral Daily   clopidogrel  75 mg Oral Daily   digoxin  0.125 mg Oral Daily   donepezil  10 mg Oral QHS   enoxaparin (LOVENOX) injection  40 mg Subcutaneous Q24H   levothyroxine  100 mcg Oral Daily   memantine  10 mg Oral BID   mirtazapine  7.5 mg Oral BID   [START ON 03/08/2022] mometasone-formoterol  2 puff Inhalation BID   mouth  rinse  15 mL Mouth Rinse Q2H   potassium chloride  40 mEq Oral Once   venlafaxine XR  75 mg Oral BID    Family History    Family History  Problem Relation Age of Onset   Hypertension Mother    Breast cancer Mother    Stroke Mother    Heart disease Father    Heart attack Sister    Hypertension Brother    Heart attack Sister    Colon cancer Brother 70   She indicated that her mother is deceased. She indicated that her father is deceased. She indicated that her maternal grandmother is deceased. She indicated that her maternal grandfather is deceased. She indicated that her paternal grandmother is deceased. She indicated that her paternal grandfather is deceased.   Social History    Social History   Socioeconomic History   Marital status: Married    Spouse name: Not on file   Number of children: 3    Years of education: Not on file   Highest education level: Not on file  Occupational History   Occupation: Retired    Fish farm manager: DISABLED  Tobacco Use   Smoking status: Former    Packs/day: 3.00    Years: 20.00    Total pack years: 60.00    Types: Cigarettes    Quit date: 02/04/2002    Years since quitting: 20.0   Smokeless tobacco: Never  Vaping Use   Vaping Use: Never used  Substance and Sexual Activity   Alcohol use: No   Drug use: No   Sexual activity: Not on file  Other Topics Concern   Not on file  Social History Narrative   Not on file   Social Determinants of Health   Financial Resource Strain: Not on file  Food Insecurity: No Food Insecurity (03/07/2022)   Hunger Vital Sign    Worried About Running Out of Food in the Last Year: Never true    [place] in the Last Year: Never true  Transportation Needs: No Transportation Needs (03/07/2022)   PRAPARE - Hydrologist (Medical): No    Lack of Transportation (Non-Medical): No  Physical Activity: Not on file  Stress: Not on file  Social Connections: Not on file  Intimate Partner Violence: Not At Risk (03/07/2022)   Humiliation, Afraid, Rape, and Kick questionnaire    Fear of Current or Ex-Partner: No    Emotionally Abused: No    Physically Abused: No    Sexually Abused: No     Review of Systems    Per notes, patient had seizure-like activity and shaking at home prompting EMS arrival.  Given alteration in mental status, patient is unable to provide any additional insight into review of systems.  Physical Exam    Blood pressure 113/64, pulse 77, temperature 98.9 F (37.2 C), temperature source Axillary, resp. rate 17, height 5\' 3"  (1.6 m), weight 62.4 kg, SpO2 94 %.  General: Agitated.  Yelling out and flailing arms at staff.   Psych: Agitated. Neuro: Disoriented to time and place. HEENT: Normal  Neck: Supple without bruits or JVD. Lungs:  Resp regular and unlabored, challenging  exam but seem to be clear to auscultation. Heart: RRR no s3, s4, or murmurs. Abdomen: Soft, non-tender, non-distended, BS + x 4.  Extremities: No clubbing, cyanosis or edema. DP/PT2+, Radials 2+ and equal bilaterally.  Labs    Cardiac Enzymes Recent Labs  Lab 03/06/22 2212 03/07/22 0044  TROPONINIHS 13  14     BNP    Component Value Date/Time   BNP 86.6 12/10/2021 1322   BNP 54.7 03/09/2009 0000    ProBNP    Component Value Date/Time   PROBNP 4.6 07/18/2009 2117    Lab Results  Component Value Date   WBC 9.3 03/06/2022   HGB 10.9 (L) 03/06/2022   HCT 36.6 03/06/2022   MCV 83.4 03/06/2022   PLT 275 03/06/2022    Recent Labs  Lab 03/07/22 0458  NA 139  K 3.2*  CL 106  CO2 24  BUN 18  CREATININE 0.95  CALCIUM 7.2*  PROT 6.9  BILITOT 0.3  ALKPHOS 162*  ALT 9  AST 17  GLUCOSE 82   Lab Results  Component Value Date   CHOL 152 10/12/2020   HDL 53 10/12/2020   LDLCALC 65 10/12/2020   TRIG 211 (H) 10/12/2020     Radiology Studies    DG Chest Portable 1 View  Result Date: 03/06/2022 CLINICAL DATA:  Altered mental status EXAM: PORTABLE CHEST 1 VIEW COMPARISON:  01/15/2022 FINDINGS: Cardiac shadow is stable. Defibrillator is again noted. Lungs are well aerated bilaterally. No focal infiltrate or effusion is seen. No bony abnormality is noted. IMPRESSION: No active disease. Electronically Signed   By: Inez Catalina M.D.   On: 03/06/2022 22:52   CT Head Wo Contrast  Result Date: 03/06/2022 CLINICAL DATA:  Altered mental status EXAM: CT HEAD WITHOUT CONTRAST TECHNIQUE: Contiguous axial images were obtained from the base of the skull through the vertex without intravenous contrast. RADIATION DOSE REDUCTION: This exam was performed according to the departmental dose-optimization program which includes automated exposure control, adjustment of the mA and/or kV according to patient size and/or use of iterative reconstruction technique. COMPARISON:  CT head 01/08/2022  FINDINGS: Images are degraded by motion artifact Brain: No intracranial hemorrhage, mass effect, or evidence of acute infarct. No hydrocephalus. No extra-axial fluid collection. Generalized cerebral atrophy. Ill-defined hypoattenuation within the cerebral white matter is nonspecific but consistent with chronic small vessel ischemic disease. Chronic infarcts right temporal lobe, left parietal lobe and left occipital lobe. Vascular: No hyperdense vessel. Intracranial arterial calcification. Skull: No fracture or focal lesion. Sinuses/Orbits: No acute finding. Paranasal sinuses and mastoid air cells are well aerated. Other: None. IMPRESSION: Motion degraded exam.  No definite acute intracranial abnormality. Electronically Signed   By: Placido Sou M.D.   On: 03/06/2022 22:42    ECG & Cardiac Imaging    Sinus tachycardia, 114, IVCD- personally reviewed.  Assessment & Plan    1.  Seizures: Patient initially diagnosed with seizures in early December with new witnessed seizure activity prompting EMS evaluation on January 31, followed by a witnessed grand mal seizure and route.  We were consulted out of concern that shaking noted at home might been secondary to arrhythmia.  ICD interrogated and she has not had any new episodes of VT/VF, antitachycardia pacing, or ICD shocks.  Seizure management per primary team.  2.  Chronic heart failure with midrange ejection fraction/nonischemic cardiomyopathy: Status post CRT-D.  Core view impedance suggested mild fluid accumulation upon evaluation January 30th.  She was advised to take an additional Lasix at that time.  Appears euvolemic on examination today.  No new events on device interrogation.  Pending stability of blood pressure and ability to take oral medications, would recommend continuation of low-dose Lasix, beta-blocker, ARB, and digoxin.  3.  History of torsades de pointes: Status post ATP x 2 (failed), and shocks x  2 on January 14, 2022.  This occurred  while patient was hospitalized low was not discovered on device interrogation until following discharge.  Unclear if patient was on cardiac monitor during time of event.  It also occurred in the setting of profound hypokalemia and hypomagnesemia.  She is hypokalemic and hypomagnesemic today and this is being supplemented by primary team.  Continue beta-blocker.  4.  Hypomagnesemia/hypokalemia: Magnesium 0.9 on admission and 1.7 this morning.  Potassium 3.2 this morning.  Supplementation has been ordered by primary team.  Goal potassium of 4.0, goal magnesium of 2.0.  Will likely require magnesium oxide at discharge.  5.  History of stroke: On clopidogrel therapy as outpatient.  6.  Hypocalcemia: Calcium of 6.2.  Potentially contributing to seizures on presentation.  She has been treated with calcium gluconate.  Calcium 7.2 this morning.  Ongoing supplementation per medicine team.  7.  Atrial arrhythmias: History of mode switching for atrial arrhythmias noted on device interrogation.  This is generally less than 1% of the time and does not require oral anticoagulation.  Risk Assessment/Risk Scores:           Signed, Murray Hodgkins, NP 03/07/2022, 1:19 PM  For questions or updates, please contact   Please consult [email or web address] for contact info under Cardiology/STEMI.

## 2022-03-07 NOTE — ED Notes (Signed)
Soft safety mittens placed for pt protection r/t pulling IV, and cardiac monitor off.

## 2022-03-07 NOTE — Assessment & Plan Note (Signed)
Continue Effexor

## 2022-03-07 NOTE — Assessment & Plan Note (Signed)
Continue donepezil, mirtazapine and memantine

## 2022-03-07 NOTE — Assessment & Plan Note (Addendum)
Appreciate neurology consultation.  As per neurology no need for getting the EEG.  Keppra discontinued and patient started on Depakote.  As per neurology okay for discharge.

## 2022-03-07 NOTE — Assessment & Plan Note (Signed)
Send off stool studies to see if infectious cause rather than from seizure medication.

## 2022-03-07 NOTE — Telephone Encounter (Signed)
Noted thanks °

## 2022-03-07 NOTE — Hospital Course (Addendum)
72 y.o. female with medical history significant for dementia and prior CVA, COPD on home O2 at 3 L, CAD, systolic CHF secondary to dilated cardiomyopathy s/p AICD, OSA, hypothyroidism, HTN, anxiety, diabetes, hospitalized from 12/5 to 01/17/2022 with status epilepticus complicated by polypharmacy, requiring ventilator support, improving with the discontinuation of several medications, who presents to the ED.ED for altered mental status and seizure-like activity that started an hour prior to arrival of EMS.  On arrival of EMS she was awake and alert but appeared slow to respond to questions and had intermittent shaking.  After being loaded into the ambulance she went on to have a grand mal seizure lasting 1 to 2 minutes aborted with IM Versed. ED course and data review: Vitals unremarkable in the ED.  Labs notable for hypocalcemia of 6.2 and hypomagnesemia of 0.9.  Creatinine was at baseline at 1.13 but with anion gap of 16 and bicarb of 16.  EKG, personally viewed and interpreted showing sinus tachycardia at 114 with nonspecific ST-T wave changes.  CT head showed no acute intracranial abnormality and chest x-ray with no active disease. Patient was loaded with Keppra 1 g given calcium gluconate 1 g, magnesium sulfate 2 g as well as oral potassium.  Hospitalist consulted for admission for seizure altered mental status.  2/2.  Neurology switch Keppra over to Depakote.  Mental status more improved today than yesterday.  Has been to evaluate her and see how she is doing with the new medication. 2/3.   husband concerned about her being on a new medication and complaining of dizziness and nausea.  Patient did not complain of dizziness or nausea today. 2/3.  Patient received oral and IV potassium.  Will continue potassium supplementation at home.  Resumed home health.  Discharged home in stable condition.  Recommend checking a BMP and magnesium and follow-up appointment.

## 2022-03-07 NOTE — ED Notes (Signed)
Pt comfortable when un-aroused. Iv patency evaluated. Labs obtained. Pt repositioned. New linens provided. Water offered. Pt cursing at staff and attempting to pull iv out. Safety mittens evaluated and remain in place. Warm blankets provided. Lights dimmed to provide resting environment.

## 2022-03-07 NOTE — Telephone Encounter (Signed)
Called and spoke to Stacy Grimes.  Discussed prior hospitalization, prior findings on device from 01/14/2022, current presentation (seizures), and device findings from interrogation early this AM (nl device fxn, no VT/VF, ATP/shocks).  All questions answered.  Reassurance offered re: cardiac issues/device function.  Pt verbalized understanding and was grateful for the information.  Murray Hodgkins, NP 03/07/2022, 5:00 PM

## 2022-03-07 NOTE — Assessment & Plan Note (Addendum)
Replaced. °

## 2022-03-07 NOTE — ED Notes (Signed)
Phone call received from [facility] with pacemaker interrogation update. States normal functioning pacemaker. Paperwork received by Emergency Dept.

## 2022-03-07 NOTE — Assessment & Plan Note (Signed)
No acute issues suspected 

## 2022-03-07 NOTE — Telephone Encounter (Signed)
New message   Pt husband called and states that pt will not be making appt tomorrow because she is in the hospital at [facility]. He wanted to make Dr. Fletcher Anon aware of what is going on.

## 2022-03-07 NOTE — Progress Notes (Signed)
PT Cancellation Note  Patient Details Name: HERMILA MILLIS MRN: [id number] DOB: November 07, 1950   Cancelled Treatment:    Reason Eval/Treat Not Completed: Fatigue/lethargy limiting ability to participate. RN states patient just got haldol and is too sleepy right now. Will re-attempt PT eval tomorrow as appropriate.    Britley Gashi 03/07/2022, 2:41 PM

## 2022-03-07 NOTE — Evaluation (Signed)
Clinical/Bedside Swallow Evaluation Patient Details  Name: Stacy Grimes MRN: [id number] Date of Birth: Jul 22, 1950  Today's Date: 03/07/2022 Time: SLP Start Time (ACUTE ONLY): 22 SLP Stop Time (ACUTE ONLY): 1150 SLP Time Calculation (min) (ACUTE ONLY): 48 min  Past Medical History:  Past Medical History:  Diagnosis Date   AAA (abdominal aortic aneurysm) (HCC)    Anginal pain ([place])    Anxiety    Arthritis    Automatic implantable cardioverter-defibrillator in situ    Biventricular ICD  [employer]    COPD (chronic obstructive pulmonary disease) ([employer])    Deafness    left ear   Dementia ([place])    Depression    Dizziness    Dysrhythmia    Fracture    history of spinal fracture   GERD (gastroesophageal reflux disease)    HFrEF (heart failure with reduced ejection fraction) ([place])    a. 2010 Cath: nonobs dzs, EF 15-20%; b. 09/2017 Echo: EF suboptimal. EF low nl; c. 04/2018 Echo: EF 40-45%.   Hypertension    Hypothyroidism    Non-ischemic cardiomyopathy ([place])    a. 2010 Cath: nonobs dzs, EF 15-20%; b. 2011 s/p SJM CRT-D; c. 09/2016 s/p Gen change; d. 09/2017 Echo: EF suboptimal. EF low nl; e. 04/2018 Echo: EF 40-45%.   Oxygen dependent    Palpitations    Shortness of breath dyspnea    Sleep apnea    Wheezing    Past Surgical History:  Past Surgical History:  Procedure Laterality Date   ABDOMINAL HYSTERECTOMY     BIV ICD GENERATOR CHANGEOUT N/A 09/17/2016   Procedure: BiV ICD Generator Changeout;  Surgeon: Deboraha Sprang, MD;  Location: [facility];  Service: Cardiovascular;  Laterality: N/A;   BLADDER SURGERY     CARDIAC CATHETERIZATION     CARDIAC DEFIBRILLATOR PLACEMENT  4 yrs ago   pacemaker   CATARACT EXTRACTION W/PHACO Left 09/01/2014   Procedure: CATARACT EXTRACTION PHACO AND INTRAOCULAR LENS PLACEMENT ([place]);  Surgeon: Lyla Glassing, MD;  Location: [facility];  Service: Ophthalmology;  Laterality: Left;  [place]: 1:12.1    CATARACT EXTRACTION W/PHACO Right 09/29/2014    Procedure: CATARACT EXTRACTION PHACO AND INTRAOCULAR LENS PLACEMENT (IOC);  Surgeon: Lyla Glassing, MD;  Location: [facility];  Service: Ophthalmology;  Laterality: Right;  [place]: 00:52.7    CHOLECYSTECTOMY     COLON SURGERY     COLONOSCOPY     COLONOSCOPY N/A 08/31/2012   Procedure: COLONOSCOPY;  Surgeon: Inda Castle, MD;  Location: [facility];  Service: Endoscopy;  Laterality: N/A;   ESOPHAGOGASTRODUODENOSCOPY N/A 07/06/2012   Procedure: ESOPHAGOGASTRODUODENOSCOPY (EGD);  Surgeon: Lafayette Dragon, MD;  Location: [facility];  Service: Endoscopy;  Laterality: N/A;   INTRAMEDULLARY (IM) NAIL INTERTROCHANTERIC Right 10/23/2021   Procedure: INTRAMEDULLARY (IM) NAIL INTERTROCHANTERIC;  Surgeon: Corky Mull, MD;  Location: [facility];  Service: Orthopedics;  Laterality: Right;   KNEE SURGERY Right    MIDDLE EAR SURGERY     SAVORY DILATION N/A 07/06/2012   Procedure: SAVORY DILATION;  Surgeon: Lafayette Dragon, MD;  Location: [facility];  Service: Endoscopy;  Laterality: N/A;   TEE WITHOUT CARDIOVERSION N/A 06/23/2020   Procedure: TRANSESOPHAGEAL ECHOCARDIOGRAM (TEE);  Surgeon: Minna Merritts, MD;  Location: [facility];  Service: Cardiovascular;  Laterality: N/A;   WRIST SURGERY Right    HPI:  Stacy Grimes is a 72 y.o. female with medical history significant for dementia and prior CVA, COPD on home O2 at 3 L, CAD, systolic CHF secondary to  dilated cardiomyopathy s/p AICD, OSA, hypothyroidism, HTN, anxiety, diabetes, hospitalized from 12/5 to 01/17/2022 with status epilepticus complicated by polypharmacy, requiring ventilator support, improving with the discontinuation of several medications, who presents to the ED for altered mental status and seizure-like activity at Home that started an hour prior to arrival of EMS.  On arrival of EMS to the house, she was awake and alert but appeared slow to respond to questions and had intermittent shaking.  After being loaded into the ambulance, she went on to have a grand mal  seizure lasting 1 to 2 minutes aborted with IM Versed.  CT head showed no acute intracranial abnormality and chest x-ray with no active disease.  Patient was loaded with Keppra 1 g given calcium gluconate 1 g, magnesium sulfate 2 g as well as oral potassium.   CXR: No active disease.    Assessment / Plan / Recommendation  Clinical Impression   Stacy Grimes seen for BSE today in the ED. She was awake, Confused -- Baseline Dementia per chart. She required min-mod verbal/tactile cues to follow through w/ tasks. Min HOH also? Per chart notes, BSE: 2022(mech soft, thins); BSE/MBSS 01/2022 w/ last diet of Dysphagia 1 w/ Nectar liquids d/t Confusion and oropharyngeal phase dysphagia while hospitalized then. Unsure what diet she has been on at home prior to this admit. CXR is clear.   No Family present -- unknown baseline w/ oral intake at home.   Stacy Grimes appears to present w/ grossly functional oropharyngeal phase swallowing w/ No overt, clinical s/s of aspiration nor overt neuromuscular deficits noted. Stacy Grimes consumed po trials w/ No immediate, overt s/s of aspiration during/post po trials. HOWEVER, Stacy Grimes presents w/ significant Confusion(Bilat. Mitts on currently) w/ a Baseline of Dementia. ANY Cognitive decline can impact awareness/timing of swallowing, oral clearing, and overall oral intake. Stacy Grimes is also Edentulous which impact effective mastication of solids. She is currently dependent on being fed. All of these factors can increase risk for aspiration, dysphagia as well as decreased oral intake overall.   In the setting of current presentation and previous MBSS in 01/2022, will chose a conservative diet consistency w/ ongoing assessment of Stacy Grimes's swallow function and hopefully have communication w/ Stacy Grimes's Family re: her status at home.   During po trials, Stacy Grimes consumed all consistencies w/ no overt coughing, decline in vocal quality, or change in respiratory presentation during/post trials. O2 sats remained in upper 90s. Oral phase  appeared [facility] w/ trials of liquids and puree for timely bolus management and control of bolus propulsion for A-P transfer for swallowing. Increased Time taken for mashing/gumming of increased textured trials, but chopping and moistening the trials appeared to aid oral phase management. Oral clearing achieved w/ all trial consistencies. OF NOTE: Stacy Grimes only accepted a few trials overall d/t lack of desire for oral intake. This was reported to [place]. OM Exam was cursory but appeared grossly [facility] w/ no unilateral lingual/labial weakness noted. Speech Clear though Stacy Grimes often muttered. Stacy Grimes required feeding support d/t Bilat. Mitts and Confusion.   Recommend a initiation of a Dysphagia level 2 w/ Nectar liquids initially -- carefully monitor straw use, and Stacy Grimes should help to Hold Cup when drinking if able. Recommend general aspiration precautions- reduce distractions, ensure oral clearing. Support w/ feeding; Supervision at meals.  Pills WHOLE in Nectar or Puree for safer, easier swallowing -- Crushed in puree if needed.  Education given on Pills in Puree; food consistencies and diet; general aspiration precautions to Stacy Grimes and NSG. Unsure of Stacy Grimes's level  of comprehension in setting of Dementia. ST services will f/u tomorrow for ongoing assessment in hopes to upgrade diet to least restrictive consistency. NSG agreed.  SLP Visit Diagnosis: Dysphagia, oropharyngeal phase (R13.12) (baseline Dementia, current confusion requiring Bilat Mitts; Edentulous; recent MBSS last admit)    Aspiration Risk  Mild aspiration risk;Risk for inadequate nutrition/hydration    Diet Recommendation   initiation of a Dysphagia level 2 w/ Nectar liquids initially -- carefully monitor straw use, and Stacy Grimes should help to Hold Cup when drinking if able. Recommend general aspiration precautions - reduce distractions, ensure oral clearing. Support w/ feeding; Supervision at meals.   Medication Administration: Whole meds with puree (vs Whole w/ Nectar liquids)     Other  Recommendations Recommended Consults:  (Dietician f/u; Palliative Care f/u for [place]) Oral Care Recommendations: Oral care BID;Oral care before and after PO;Staff/trained caregiver to provide oral care Other Recommendations: Order thickener from pharmacy;Prohibited food (jello, ice cream, thin soups);Remove water pitcher;Have oral suction available    Recommendations for follow up therapy are one component of a multi-disciplinary discharge planning process, led by the attending physician.  Recommendations may be updated based on patient status, additional functional criteria and insurance authorization.  Follow up Recommendations  (TBD)      Assistance Recommended at Discharge  Full   Functional Status Assessment Patient has had a recent decline in their functional status and demonstrates the ability to make significant improvements in function in a reasonable and predictable amount of time.  Frequency and Duration min 2x/week  2 weeks       Prognosis Prognosis for Safe Diet Advancement: Fair Barriers to Reach Goals: Cognitive deficits;Language deficits;Time post onset;Severity of deficits;Behavior;Motivation Barriers/Prognosis Comment: baseline Dementia, current confusion requiring Bilat Mitts; Edentulous; recent MBSS last admit indicating need for Nectar consistency liquids in setting of delayed pharyngeal swallowing and Confusion      Swallow Study   General Date of Onset: 03/06/22 HPI: Stacy Grimes is a 72 y.o. female with medical history significant for dementia and prior CVA, COPD on home O2 at 3 L, CAD, systolic CHF secondary to dilated cardiomyopathy s/p AICD, OSA, hypothyroidism, HTN, anxiety, diabetes, hospitalized from 12/5 to 01/17/2022 with status epilepticus complicated by polypharmacy, requiring ventilator support, improving with the discontinuation of several medications, who presents to the ED for altered mental status and seizure-like activity at Home that started an hour  prior to arrival of EMS.  On arrival of EMS to the house, she was awake and alert but appeared slow to respond to questions and had intermittent shaking.  After being loaded into the ambulance, she went on to have a grand mal seizure lasting 1 to 2 minutes aborted with IM Versed.  CT head showed no acute intracranial abnormality and chest x-ray with no active disease.  Patient was loaded with Keppra 1 g given calcium gluconate 1 g, magnesium sulfate 2 g as well as oral potassium.   CXR: No active disease. Type of Study: Bedside Swallow Evaluation Previous Swallow Assessment: BSE: 2022(mech soft, thins); BSE/MBSS 01/2022 w/ last diet of Dysphagia 1 w/ Nectar liquids while hospitalized then.  Unsure what diet she has been on at home prior to admit. Diet Prior to this Study: NPO Temperature Spikes Noted: No (wbc 9.3) Respiratory Status: Nasal cannula (3L) History of Recent Intubation: No Behavior/Cognition: Alert;Cooperative;Pleasant mood;Confused;Agitated;Distractible;Requires cueing (bilat Mitts) Oral Cavity Assessment: Within Functional Limits Oral Care Completed by SLP: Yes (attempted) Oral Cavity - Dentition: Edentulous Vision:  (n/a) Self-Feeding Abilities: Total assist (bilat Mitts)  Patient Positioning: Postural control adequate for testing (fidgity) Baseline Vocal Quality: Normal Volitional Cough: Cognitively unable to elicit Volitional Swallow: Unable to elicit    Oral/Motor/Sensory Function Overall Oral Motor/Sensory Function: Within functional limits (no unilateral weakness noted during bolus management)   Ice Chips Ice chips: Within functional limits Presentation: Spoon (fed; 2 trials) Other Comments: declined more   Thin Liquid Thin Liquid: Within functional limits Presentation: Straw (fed; 11 trials (pinched straw when needed)) Other Comments: declines more    Nectar Thick Nectar Thick Liquid: Not tested   Honey Thick Honey Thick Liquid: Not tested   Puree Puree: Within  functional limits Presentation: Spoon (fed; 4 trials) Other Comments: declined more   Solid     Solid: Impaired (edentulous) Presentation: Spoon (fed; 3 trials) Oral Phase Impairments: Impaired mastication Oral Phase Functional Implications: Prolonged oral transit Pharyngeal Phase Impairments:  (none) Other Comments: declined more; Edentulous, Confusion and Distraction        Orinda Kenner, MS, CCC-SLP Speech Language Pathologist Rehab Services; [place] [phone] (ascom) Anastasya Jewell 03/07/2022,11:58 AM

## 2022-03-07 NOTE — Assessment & Plan Note (Signed)
Chronic respiratory failure on 3 L Not acutely exacerbated and home O2 requirement at baseline Continue home inhalers with DuoNebs as needed

## 2022-03-07 NOTE — Assessment & Plan Note (Signed)
Continue levothyroxine 

## 2022-03-07 NOTE — ED Notes (Signed)
Pacemaker interrogated. Device displays collection and successful transfer of data. [employer]

## 2022-03-07 NOTE — Telephone Encounter (Signed)
Patient is in the hospital and want dr Caryl Comes to check in on the patient. She is in [place] there is something wrong with pacemaker. Please advise

## 2022-03-07 NOTE — ED Notes (Signed)
Pt states pain all over. Pt started yelling at this RN and cussing him out saying "what the hell are you doing" and "damit"

## 2022-03-07 NOTE — Assessment & Plan Note (Addendum)
Replaced with 1 run of potassium and oral potassium today.  Continue the potassium supplementation on a daily basis

## 2022-03-07 NOTE — Assessment & Plan Note (Addendum)
Likely secondary to seizure.  Mental status improved over the last 3 days

## 2022-03-07 NOTE — H&P (Signed)
History and Physical    Patient: Stacy Grimes FBP:[id number] DOB: Nov 26, 1950 DOA: 03/06/2022 DOS: the patient was seen and examined on 03/07/2022 PCP: Stacy Bender, PA-C  Patient coming from: Home  Chief Complaint:  Chief Complaint  Patient presents with   Altered Mental Status    HPI: Stacy Grimes is a 72 y.o. female with medical history significant for dementia and prior CVA, COPD on home O2 at 3 L, CAD, systolic CHF secondary to dilated cardiomyopathy s/p AICD, OSA, hypothyroidism, HTN, anxiety, diabetes, hospitalized from 12/5 to 01/17/2022 with status epilepticus complicated by polypharmacy, requiring ventilator support, improving with the discontinuation of several medications, who presents to the ED.ED for altered mental status and seizure-like activity that started an hour prior to arrival of EMS.  On arrival of EMS she was awake and alert but appeared slow to respond to questions and had intermittent shaking.  After being loaded into the ambulance she went on to have a grand mal seizure lasting 1 to 2 minutes aborted with IM Versed. ED course and data review: Vitals unremarkable in the ED.  Labs notable for hypocalcemia of 6.2 and hypomagnesemia of 0.9.  Creatinine was at baseline at 1.13 but with anion gap of 16 and bicarb of 16.  EKG, personally viewed and interpreted showing sinus tachycardia at 114 with nonspecific ST-T wave changes.  CT head showed no acute intracranial abnormality and chest x-ray with no active disease. Patient was loaded with Keppra 1 g given calcium gluconate 1 g, magnesium sulfate 2 g as well as oral potassium.  Hospitalist consulted for admission for seizure altered mental status..     Past Medical History:  Diagnosis Date   AAA (abdominal aortic aneurysm) (HCC)    Anginal pain ([place])    Anxiety    Arthritis    Automatic implantable cardioverter-defibrillator in situ    Biventricular ICD  [employer] [facility]    COPD (chronic obstructive pulmonary disease)  (HCC)    Deafness    left ear   Dementia (HCC)    Depression    Dizziness    Dysrhythmia    Fracture    history of spinal fracture   GERD (gastroesophageal reflux disease)    HFrEF (heart failure with reduced ejection fraction) ([place])    a. 2010 Cath: nonobs dzs, EF 15-20%; b. 09/2017 Echo: EF suboptimal. EF low nl; c. 04/2018 Echo: EF 40-45%.   Hypertension    Hypothyroidism    Non-ischemic cardiomyopathy ([place])    a. 2010 Cath: nonobs dzs, EF 15-20%; b. 2011 s/p SJM CRT-D; c. 09/2016 s/p Gen change; d. 09/2017 Echo: EF suboptimal. EF low nl; e. 04/2018 Echo: EF 40-45%.   Oxygen dependent    Palpitations    Shortness of breath dyspnea    Sleep apnea    Wheezing    Past Surgical History:  Procedure Laterality Date   ABDOMINAL HYSTERECTOMY     BIV ICD GENERATOR CHANGEOUT N/A 09/17/2016   Procedure: BiV ICD Generator Changeout;  Surgeon: Deboraha Sprang, MD;  Location: [facility];  Service: Cardiovascular;  Laterality: N/A;   BLADDER SURGERY     CARDIAC CATHETERIZATION     CARDIAC DEFIBRILLATOR PLACEMENT  4 yrs ago   pacemaker   CATARACT EXTRACTION W/PHACO Left 09/01/2014   Procedure: CATARACT EXTRACTION PHACO AND INTRAOCULAR LENS PLACEMENT ([place]);  Surgeon: Lyla Glassing, MD;  Location: [facility];  Service: Ophthalmology;  Laterality: Left;  [place]: 1:12.1    CATARACT EXTRACTION W/PHACO Right 09/29/2014  Procedure: CATARACT EXTRACTION PHACO AND INTRAOCULAR LENS PLACEMENT (I[facility]);  Surgeon: Lyla Glassing, MD;  Location: [facility];  Service: Ophthalmology;  Laterality: Right;  [place]: 00:52.7    CHOLECYSTECTOMY     COLON SURGERY     COLONOSCOPY     COLONOSCOPY N/A 08/31/2012   Procedure: COLONOSCOPY;  Surgeon: Inda Castle, MD;  Location: [facility];  Service: Endoscopy;  Laterality: N/A;   ESOPHAGOGASTRODUODENOSCOPY N/A 07/06/2012   Procedure: ESOPHAGOGASTRODUODENOSCOPY (EGD);  Surgeon: Lafayette Dragon, MD;  Location: [facility];  Service: Endoscopy;  Laterality: N/A;    INTRAMEDULLARY (IM) NAIL INTERTROCHANTERIC Right 10/23/2021   Procedure: INTRAMEDULLARY (IM) NAIL INTERTROCHANTERIC;  Surgeon: Corky Mull, MD;  Location: [facility];  Service: Orthopedics;  Laterality: Right;   KNEE SURGERY Right    MIDDLE EAR SURGERY     SAVORY DILATION N/A 07/06/2012   Procedure: SAVORY DILATION;  Surgeon: Lafayette Dragon, MD;  Location: [facility];  Service: Endoscopy;  Laterality: N/A;   TEE WITHOUT CARDIOVERSION N/A 06/23/2020   Procedure: TRANSESOPHAGEAL ECHOCARDIOGRAM (TEE);  Surgeon: Minna Merritts, MD;  Location: [facility];  Service: Cardiovascular;  Laterality: N/A;   WRIST SURGERY Right    Social History:  reports that she quit smoking about 20 years ago. Her smoking use included cigarettes. She has a 60.00 pack-year smoking history. She has never used smokeless tobacco. She reports that she does not drink alcohol and does not use drugs.  Allergies  Allergen Reactions   Codeine Palpitations   Contrast Media [Iodinated Contrast Media] Rash    Family History  Problem Relation Age of Onset   Hypertension Mother    Breast cancer Mother    Stroke Mother    Heart disease Father    Heart attack Sister    Hypertension Brother    Heart attack Sister    Colon cancer Brother 32    Prior to Admission medications   Medication Sig Start Date End Date Taking? Authorizing Provider  acetaminophen (TYLENOL) 650 MG CR tablet Take 650 mg by mouth at bedtime.   Yes [provider]  allopurinol (ZYLOPRIM) 100 MG tablet Take 1 tablet (100 mg total) by mouth daily. 01/17/22  Yes Little Ishikawa, MD  atorvastatin (LIPITOR) 80 MG tablet Take 1 tablet (80 mg total) by mouth daily. 07/12/21 07/07/22 Yes Wellington Hampshire, MD  budesonide-formoterol (SYMBICORT) 160-4.5 MCG/ACT inhaler Inhale 2 puffs into the lungs 2 (two) times daily.   Yes [provider]  clopidogrel (PLAVIX) 75 MG tablet Take 1 tablet (75 mg total) by mouth daily. 07/12/21  Yes Wellington Hampshire,  MD  digoxin (LANOXIN) 0.125 MG tablet TAKE 1 TABLET BY MOUTH ON MONDAY, [facility] AND FRIDAY, Office visit needed for additional refill 02/25/22  Yes Wellington Hampshire, MD  donepezil (ARICEPT) 10 MG tablet Take 10 mg by mouth at bedtime.   Yes [provider]  furosemide (LASIX) 20 MG tablet Take 1 tablet (20 mg total) by mouth every other day. May take an additional tablet AS NEEDED for weight gain of 3 lbs overnight or 5 lbs in one week. Do not exceed more than 3 additional doses. 01/17/22  Yes Little Ishikawa, MD  levETIRAcetam (KEPPRA) 1000 MG tablet Take 1 tablet (1,000 mg total) by mouth 2 (two) times daily. 01/17/22  Yes Little Ishikawa, MD  levothyroxine (SYNTHROID) 100 MCG tablet Take 100 mcg by mouth daily. 01/04/22  Yes [provider]  losartan (COZAAR) 25 MG tablet Take 12.5 mg by  mouth daily. 02/23/22  Yes [provider]  memantine (NAMENDA) 10 MG tablet Take 10 mg by mouth in the morning and at bedtime. 07/10/21  Yes [provider]  metoprolol succinate (TOPROL-XL) 25 MG 24 hr tablet Take 25 mg by mouth daily.   Yes [provider]  mirtazapine (REMERON) 7.5 MG tablet Take 7.5 mg by mouth 2 (two) times daily. 10/10/21  Yes [provider]  omeprazole (PRILOSEC) 20 MG capsule Take 1 capsule (20 mg total) by mouth every morning. 04/12/18  Yes Gladstone Lighter, MD  potassium chloride SA (KLOR-CON M) 20 MEQ tablet Take 1 tablet (20 mEq total) by mouth daily. 07/12/21  Yes Wellington Hampshire, MD  tiotropium (SPIRIVA) 18 MCG inhalation capsule Place 1 capsule (18 mcg total) into inhaler and inhale daily as needed (shortness of breath). 04/12/18  Yes Gladstone Lighter, MD  venlafaxine XR (EFFEXOR-XR) 75 MG 24 hr capsule Take 75 mg by mouth 2 (two) times daily. 10/21/21  Yes [provider]  vitamin B-12 (CYANOCOBALAMIN) 1000 MCG tablet Take 1,000 mcg by mouth 2 (two) times daily.    Yes [provider]  albuterol (ACCUNEB)  0.63 MG/3ML nebulizer solution Take 1 ampule by nebulization every 6 (six) hours as needed for wheezing.    [provider]  albuterol (PROVENTIL HFA;VENTOLIN HFA) 108 (90 Base) MCG/ACT inhaler Inhale 2 puffs into the lungs every 6 (six) hours as needed. Patient taking differently: Inhale 2 puffs into the lungs every 6 (six) hours as needed for wheezing or shortness of breath. 11/02/16   Schaevitz, Randall An, MD  gabapentin (NEURONTIN) 300 MG capsule Take 300 mg by mouth 2 (two) times daily as needed (pain/restlessness). 12/11/21   [provider]  hydrOXYzine (VISTARIL) 25 MG capsule Take 25 mg by mouth at bedtime as needed. 02/13/22   [provider]  Menthol, Topical Analgesic, (BENGAY EX) Apply 1 application topically daily as needed (back pain).    [provider]    Physical Exam: Vitals:   03/06/22 2239 03/06/22 2330 03/07/22 0100 03/07/22 0136  BP: 110/61 (!) 120/56 (!) 106/56 105/67  Pulse: 98 84 78 82  Resp: 19 18 19 19   Temp:      TempSrc:      SpO2: 100% 100% 100% 100%  Weight:      Height:       Physical Exam Vitals and nursing note reviewed.  Constitutional:      General: She is not in acute distress.    Comments: Confused. In mittens  HENT:     Head: Normocephalic and atraumatic.  Cardiovascular:     Rate and Rhythm: Normal rate and regular rhythm.     Heart sounds: Normal heart sounds.  Pulmonary:     Effort: Pulmonary effort is normal.     Breath sounds: Normal breath sounds.  Abdominal:     Palpations: Abdomen is soft.     Tenderness: There is no abdominal tenderness.  Neurological:     General: No focal deficit present.     Mental Status: She is disoriented.     Labs on Admission: I have personally reviewed following labs and imaging studies  CBC: Recent Labs  Lab 03/06/22 2212  WBC 9.3  NEUTROABS 7.4  HGB 10.9*  HCT 36.6  MCV 83.4  PLT 350   Basic Metabolic Panel: Recent Labs  Lab 03/06/22 2212  NA 139   K 3.8  CL 107  CO2 16*  GLUCOSE 198*  BUN 15  CREATININE 1.13*  CALCIUM 6.2*  MG 0.9*   GFR: Estimated Creatinine Clearance: 37.2 mL/min (A) (by C-G formula based on SCr of 1.13 mg/dL (H)). Liver Function Tests: Recent Labs  Lab 03/06/22 2212  AST 32  ALT 8  ALKPHOS 152*  BILITOT 0.7  PROT 6.5  ALBUMIN 2.3*   No results for input(s): "LIPASE", "AMYLASE" in the last 168 hours. No results for input(s): "AMMONIA" in the last 168 hours. Coagulation Profile: No results for input(s): "INR", "PROTIME" in the last 168 hours. Cardiac Enzymes: No results for input(s): "CKTOTAL", "CKMB", "CKMBINDEX", "TROPONINI" in the last 168 hours. BNP (last 3 results) No results for input(s): "PROBNP" in the last 8760 hours. HbA1C: No results for input(s): "HGBA1C" in the last 72 hours. CBG: No results for input(s): "GLUCAP" in the last 168 hours. Lipid Profile: No results for input(s): "CHOL", "HDL", "LDLCALC", "TRIG", "CHOLHDL", "LDLDIRECT" in the last 72 hours. Thyroid Function Tests: No results for input(s): "TSH", "T4TOTAL", "FREET4", "T3FREE", "THYROIDAB" in the last 72 hours. Anemia Panel: No results for input(s): "VITAMINB12", "FOLATE", "FERRITIN", "TIBC", "IRON", "RETICCTPCT" in the last 72 hours. Urine analysis:    Component Value Date/Time   COLORURINE YELLOW (A) 03/06/2022 2318   APPEARANCEUR HAZY (A) 03/06/2022 2318   APPEARANCEUR Clear 07/31/2013 1658   LABSPEC 1.013 03/06/2022 2318   LABSPEC 1.018 07/31/2013 1658   PHURINE 5.0 03/06/2022 2318   GLUCOSEU NEGATIVE 03/06/2022 2318   GLUCOSEU Negative 07/31/2013 1658   HGBUR NEGATIVE 03/06/2022 2318   BILIRUBINUR NEGATIVE 03/06/2022 2318   BILIRUBINUR Negative 07/31/2013 [place] 03/06/2022 2318   PROTEINUR 30 (A) 03/06/2022 2318   UROBILINOGEN 0.2 03/28/2012 1526   NITRITE NEGATIVE 03/06/2022 2318   LEUKOCYTESUR NEGATIVE 03/06/2022 2318   LEUKOCYTESUR Negative 07/31/2013 1658    Radiological Exams  on Admission: DG Chest Portable 1 View  Result Date: 03/06/2022 CLINICAL DATA:  Altered mental status EXAM: PORTABLE CHEST 1 VIEW COMPARISON:  01/15/2022 FINDINGS: Cardiac shadow is stable. Defibrillator is again noted. Lungs are well aerated bilaterally. No focal infiltrate or effusion is seen. No bony abnormality is noted. IMPRESSION: No active disease. Electronically Signed   By: Inez Catalina M.D.   On: 03/06/2022 22:52   CT Head Wo Contrast  Result Date: 03/06/2022 CLINICAL DATA:  Altered mental status EXAM: CT HEAD WITHOUT CONTRAST TECHNIQUE: Contiguous axial images were obtained from the base of the skull through the vertex without intravenous contrast. RADIATION DOSE REDUCTION: This exam was performed according to the departmental dose-optimization program which includes automated exposure control, adjustment of the mA and/or kV according to patient size and/or use of iterative reconstruction technique. COMPARISON:  CT head 01/08/2022 FINDINGS: Images are degraded by motion artifact Brain: No intracranial hemorrhage, mass effect, or evidence of acute infarct. No hydrocephalus. No extra-axial fluid collection. Generalized cerebral atrophy. Ill-defined hypoattenuation within the cerebral white matter is nonspecific but consistent with chronic small vessel ischemic disease. Chronic infarcts right temporal lobe, left parietal lobe and left occipital lobe. Vascular: No hyperdense vessel. Intracranial arterial calcification. Skull: No fracture or focal lesion. Sinuses/Orbits: No acute finding. Paranasal sinuses and mastoid air cells are well aerated. Other: None. IMPRESSION: Motion degraded exam.  No definite acute intracranial abnormality. Electronically Signed   By: Placido Sou M.D.   On: 03/06/2022 22:42     Data Reviewed: Relevant notes from primary care and specialist visits, past discharge summaries as available in EHR, including Care Everywhere. Prior diagnostic testing as pertinent to  current admission diagnoses  Updated medications and problem lists for reconciliation ED course, including vitals, labs, imaging, treatment and response to treatment Triage notes, nursing and pharmacy notes and ED provider's notes Notable results as noted in HPI   Assessment and Plan: * Breakthrough seizure ([place]) Patient was loaded with Keppra 1000 mg IV in the ED Continue home dose of Keppra 1000 twice daily pending Keppra level Ativan as needed seizure Follow Keppra level Neurology consult Seizure precautions  Acute metabolic encephalopathy Likely secondary to electrolyte abnormalities of hypomagnesemia and hypocalcemia Electrolyte replacement Patient back to normal mentation Fall and aspiration precautions as well as seizure precautions  Hypocalcemia Received calcium gluconate Monitor and replete as needed  Hypomagnesemia Received a 2 g magnesium IV piggyback in the ED Continue to monitor magnesium and replete as needed  Chronic systolic CHF (congestive heart failure) (HCC) Dilated cardiomyopathy s/p AICD ([employer]) Clinically euvolemic Last EF 40 to 45% September 2023 Hold losartan, metoprolol, and for, due to soft blood pressures Continue digoxin  Chronic obstructive pulmonary disease, unspecified COPD type (HCC) Chronic respiratory failure on 3 L Not acutely exacerbated and home O2 requirement at baseline Continue home inhalers with DuoNebs as needed  Dementia (HCC) Continue donepezil, mirtazapine and memantine  Depression Continue Effexor  AAA (abdominal aortic aneurysm) ([place]) No acute issues suspected  Hypothyroidism Continue levothyroxine  Hypertension Borderline hypotension BP 106/56 Will hold losartan, metoprolol and furosemide tonight and resume as appropriate      DVT prophylaxis: Lovenox  Consults: neurology  Advance Care Planning:   Code Status: Prior   Family Communication: none  Disposition Plan: Back to previous home  environment  Severity of Illness: The appropriate patient status for this patient is OBSERVATION. Observation status is judged to be reasonable and necessary in order to provide the required intensity of service to ensure the patient's safety. The patient's presenting symptoms, physical exam findings, and initial radiographic and laboratory data in the context of their medical condition is felt to place them at decreased risk for further clinical deterioration. Furthermore, it is anticipated that the patient will be medically stable for discharge from the hospital within 2 midnights of admission.   Author: Athena Masse, MD 03/07/2022 1:39 AM  For on call review [email or web address].

## 2022-03-07 NOTE — Consult Note (Signed)
NEUROLOGY CONSULTATION NOTE   Date of service: March 07, 2022 Patient Name: Stacy Grimes MRN:  [id number] DOB:  [birth year] Reason for consult: seizures Requesting physician: Dr. Loletha Grayer _ _ _   _ __   _ __ _ _  __ __   _ __   __ _  History of Present Illness   This is a 72 yo woman with hx dementia with behavioral disturbance, prior CVA, COPD on home O2 3L, CAD,, systolic heart failure secondary to dilated cardiomyopathy status post AICD, OSA, hypothyroidism, hypertension, anxiety, diabetes, hospitalized from December 5 through January 17, 2022 with status epilepticus complicated by polypharmacy, intubated at that time, who presented to the ED for altered mental status and seizure-like activity that started an hour prior to arrival of EMS.  On EMS arrival she was awake and alert but slow to respond to questions and had intermittent shaking.  She was brought to the ED and and route with EMS she had a GTC lasting 1 to 2 minutes aborted with IM Versed.  She was discharged on Keppra 1000 mg twice daily which was decreased by husband to 500 mg twice daily due to diarrhea.  She was subsequently decreased to 500 mg daily by her outpatient neurologist Dr. Melrose Nakayama due to concern for ongoing GI distress.  On arrival to our ED she was given 1 g of Keppra and started on 1000 mg twice daily.  Her husband understands that she has seizures and needs to be on a seizure medication but would like to switch to something that does not cause GI distress. She has baseline dementia and is typically oriented to self only.   CT head in ED personally reviewed; motion-degraded with no clear acute intracranial process.   ROS   UTA 2/2 dementia  Past History   I have reviewed the following:  Past Medical History:  Diagnosis Date   AAA (abdominal aortic aneurysm) (HCC)    Anginal pain ([place])    Anxiety    Arthritis    Automatic implantable cardioverter-defibrillator in situ    Biventricular ICD  [employer]     a.  2011 s/p SJM CRT-D; b. 09/2016 gen change: SJM BiV AICD (ser# [id number]).   COPD (chronic obstructive pulmonary disease) (HCC)    Deafness    left ear   Dementia ([place])    Depression    Dizziness    Fracture    history of spinal fracture   GERD (gastroesophageal reflux disease)    HFrEF (heart failure with reduced ejection fraction) ([place])    a. 2010 Cath: nonobs dzs, EF 15-20%; b. 09/2017 Echo: EF suboptimal. EF low nl; c. 04/2018 Echo: EF 40-45%; d. 10/2021 Echo: EF 40-45%, glob HK, mildly red RV fxn, sev elev PASP, triv MR, mild-mod TR.   Hypertension    Hypothyroidism    Non-ischemic cardiomyopathy ([place])    a. 2010 Cath: nonobs dzs, EF 15-20%; b. 2011 s/p SJM CRT-D; c. 09/2016 s/p Gen change; d. 09/2017 Echo: EF suboptimal. EF low nl; e. 04/2018 Echo: EF 40-45%; f. 10/2021 Echo: EF 40-45%, glob HK, mildly red RV fxn, sev elev PASP, triv MR, mild-mod TR.   Oxygen dependent    Palpitations    Sleep apnea    Past Surgical History:  Procedure Laterality Date   ABDOMINAL HYSTERECTOMY     BIV ICD GENERATOR CHANGEOUT N/A 09/17/2016   Procedure: BiV ICD Generator Changeout;  Surgeon: Deboraha Sprang, MD;  Location: [facility];  Service: Cardiovascular;  Laterality: N/A;   BLADDER SURGERY     CARDIAC CATHETERIZATION     CARDIAC DEFIBRILLATOR PLACEMENT  4 yrs ago   pacemaker   CATARACT EXTRACTION W/PHACO Left 09/01/2014   Procedure: CATARACT EXTRACTION PHACO AND INTRAOCULAR LENS PLACEMENT ([place]);  Surgeon: Lyla Glassing, MD;  Location: [facility];  Service: Ophthalmology;  Laterality: Left;  [place]: 1:12.1    CATARACT EXTRACTION W/PHACO Right 09/29/2014   Procedure: CATARACT EXTRACTION PHACO AND INTRAOCULAR LENS PLACEMENT (IOC);  Surgeon: Lyla Glassing, MD;  Location: [facility];  Service: Ophthalmology;  Laterality: Right;  [place]: 00:52.7    CHOLECYSTECTOMY     COLON SURGERY     COLONOSCOPY     COLONOSCOPY N/A 08/31/2012   Procedure: COLONOSCOPY;  Surgeon: Inda Castle, MD;  Location: [facility];  Service: Endoscopy;  Laterality: N/A;   ESOPHAGOGASTRODUODENOSCOPY N/A 07/06/2012   Procedure: ESOPHAGOGASTRODUODENOSCOPY (EGD);  Surgeon: Lafayette Dragon, MD;  Location: [facility];  Service: Endoscopy;  Laterality: N/A;   INTRAMEDULLARY (IM) NAIL INTERTROCHANTERIC Right 10/23/2021   Procedure: INTRAMEDULLARY (IM) NAIL INTERTROCHANTERIC;  Surgeon: Corky Mull, MD;  Location: [facility];  Service: Orthopedics;  Laterality: Right;   KNEE SURGERY Right    MIDDLE EAR SURGERY     SAVORY DILATION N/A 07/06/2012   Procedure: SAVORY DILATION;  Surgeon: Lafayette Dragon, MD;  Location: [facility];  Service: Endoscopy;  Laterality: N/A;   TEE WITHOUT CARDIOVERSION N/A 06/23/2020   Procedure: TRANSESOPHAGEAL ECHOCARDIOGRAM (TEE);  Surgeon: Minna Merritts, MD;  Location: [facility];  Service: Cardiovascular;  Laterality: N/A;   WRIST SURGERY Right    Family History  Problem Relation Age of Onset   Hypertension Mother    Breast cancer Mother    Stroke Mother    Heart disease Father    Heart attack Sister    Hypertension Brother    Heart attack Sister    Colon cancer Brother 53   Social History   Socioeconomic History   Marital status: Married    Spouse name: Not on file   Number of children: 3   Years of education: Not on file   Highest education level: Not on file  Occupational History   Occupation: Retired    Fish farm manager: DISABLED  Tobacco Use   Smoking status: Former    Packs/day: 3.00    Years: 20.00    Total pack years: 60.00    Types: Cigarettes    Quit date: 02/04/2002    Years since quitting: 20.0   Smokeless tobacco: Never  Vaping Use   Vaping Use: Never used  Substance and Sexual Activity   Alcohol use: No   Drug use: No   Sexual activity: Not on file  Other Topics Concern   Not on file  Social History Narrative   Not on file   Social Determinants of Health   Financial Resource Strain: Not on file  Food Insecurity: No Food Insecurity (03/07/2022)   Hunger Vital  Sign    Worried About Running Out of Food in the Last Year: Never true    [place] in the Last Year: Never true  Transportation Needs: No Transportation Needs (03/07/2022)   PRAPARE - Hydrologist (Medical): No    Lack of Transportation (Non-Medical): No  Physical Activity: Not on file  Stress: Not on file  Social Connections: Not on file   Allergies  Allergen Reactions   Codeine Palpitations   Contrast Media [Iodinated Contrast  Media] Rash    Medications   (Not in a hospital admission)     Current Facility-Administered Medications:    0.9 %  sodium chloride infusion, , Intravenous, Continuous, Wieting, Richard, MD, Last Rate: 40 mL/hr at 03/07/22 1200, New Bag at 03/07/22 1200   acetaminophen (TYLENOL) tablet 650 mg, 650 mg, Oral, Q4H PRN **OR** acetaminophen (TYLENOL) suppository 650 mg, 650 mg, Rectal, Q4H PRN, Athena Masse, MD   albuterol (PROVENTIL) (2.5 MG/3ML) 0.083% nebulizer solution 3 mL, 3 mL, Nebulization, Q6H PRN, Athena Masse, MD   atorvastatin (LIPITOR) tablet 80 mg, 80 mg, Oral, Daily, Judd Gaudier V, MD, 80 mg at 03/07/22 0941   clopidogrel (PLAVIX) tablet 75 mg, 75 mg, Oral, Daily, Judd Gaudier V, MD, 75 mg at 03/07/22 0093   digoxin (LANOXIN) tablet 0.125 mg, 0.125 mg, Oral, Daily, Judd Gaudier V, MD, 0.125 mg at 03/07/22 0943   donepezil (ARICEPT) tablet 10 mg, 10 mg, Oral, QHS, Judd Gaudier V, MD, 10 mg at 03/07/22 0307   enoxaparin (LOVENOX) injection 40 mg, 40 mg, Subcutaneous, Q24H, Judd Gaudier V, MD, 40 mg at 03/07/22 0945   gabapentin (NEURONTIN) capsule 300 mg, 300 mg, Oral, BID PRN, Athena Masse, MD   haloperidol lactate (HALDOL) injection 1 mg, 1 mg, Intravenous, Q6H PRN, Leslye Peer, Richard, MD, 1 mg at 03/07/22 1416   levETIRAcetam (KEPPRA) IVPB 1000 mg/100 mL premix, 1,000 mg, Intravenous, Q12H, Loletha Grayer, MD, Stopped at 03/07/22 1001   levothyroxine (SYNTHROID) tablet 100 mcg, 100 mcg, Oral, Daily,  Judd Gaudier V, MD, 100 mcg at 03/07/22 8182   memantine (NAMENDA) tablet 10 mg, 10 mg, Oral, BID, Judd Gaudier V, MD, 10 mg at 03/07/22 9937   mirtazapine (REMERON) tablet 7.5 mg, 7.5 mg, Oral, BID, Judd Gaudier V, MD, 7.5 mg at 03/07/22 0942   [START ON 03/08/2022] mometasone-formoterol (DULERA) 200-5 MCG/ACT inhaler 2 puff, 2 puff, Inhalation, BID, Wieting, Richard, MD   ondansetron (ZOFRAN) tablet 4 mg, 4 mg, Oral, Q6H PRN **OR** ondansetron (ZOFRAN) injection 4 mg, 4 mg, Intravenous, Q6H PRN, Athena Masse, MD   Oral care mouth rinse, 15 mL, Mouth Rinse, Q2H, Judd Gaudier V, MD, 15 mL at 03/07/22 0946   Oral care mouth rinse, 15 mL, Mouth Rinse, PRN, Judd Gaudier V, MD   potassium chloride 10 mEq in 100 mL IVPB, 10 mEq, Intravenous, Q1 Hr x 3, Wieting, Richard, MD, Last Rate: 50 mL/hr at 03/07/22 1428, 10 mEq at 03/07/22 1428   potassium chloride SA (KLOR-CON M) CR tablet 40 mEq, 40 mEq, Oral, Once, Blake Divine, MD   venlafaxine XR (EFFEXOR-XR) 24 hr capsule 75 mg, 75 mg, Oral, BID, Judd Gaudier V, MD, 75 mg at 03/07/22 0944  Current Outpatient Medications:    acetaminophen (TYLENOL) 650 MG CR tablet, Take 650 mg by mouth at bedtime., Disp: , Rfl:    allopurinol (ZYLOPRIM) 100 MG tablet, Take 1 tablet (100 mg total) by mouth daily., Disp: 30 tablet, Rfl: 0   atorvastatin (LIPITOR) 80 MG tablet, Take 1 tablet (80 mg total) by mouth daily., Disp: 90 tablet, Rfl: 3   budesonide-formoterol (SYMBICORT) 160-4.5 MCG/ACT inhaler, Inhale 2 puffs into the lungs 2 (two) times daily., Disp: , Rfl:    clopidogrel (PLAVIX) 75 MG tablet, Take 1 tablet (75 mg total) by mouth daily., Disp: 90 tablet, Rfl: 3   digoxin (LANOXIN) 0.125 MG tablet, TAKE 1 TABLET BY MOUTH ON MONDAY, WEDNESDAY AND FRIDAY, Office visit needed for additional refill, Disp: 12 tablet,  Rfl: 0   donepezil (ARICEPT) 10 MG tablet, Take 10 mg by mouth at bedtime., Disp: , Rfl:    furosemide (LASIX) 20 MG tablet, Take 1 tablet (20  mg total) by mouth every other day. May take an additional tablet AS NEEDED for weight gain of 3 lbs overnight or 5 lbs in one week. Do not exceed more than 3 additional doses., Disp: 45 tablet, Rfl: 1   levETIRAcetam (KEPPRA) 1000 MG tablet, Take 1 tablet (1,000 mg total) by mouth 2 (two) times daily., Disp: 60 tablet, Rfl: 1   levothyroxine (SYNTHROID) 100 MCG tablet, Take 100 mcg by mouth daily., Disp: , Rfl:    losartan (COZAAR) 25 MG tablet, Take 12.5 mg by mouth daily., Disp: , Rfl:    memantine (NAMENDA) 10 MG tablet, Take 10 mg by mouth in the morning and at bedtime., Disp: , Rfl:    metoprolol succinate (TOPROL-XL) 25 MG 24 hr tablet, Take 25 mg by mouth daily., Disp: , Rfl:    mirtazapine (REMERON) 7.5 MG tablet, Take 7.5 mg by mouth 2 (two) times daily., Disp: , Rfl:    omeprazole (PRILOSEC) 20 MG capsule, Take 1 capsule (20 mg total) by mouth every morning., Disp: 30 capsule, Rfl: 2   potassium chloride SA (KLOR-CON M) 20 MEQ tablet, Take 1 tablet (20 mEq total) by mouth daily., Disp: 30 tablet, Rfl: 6   tiotropium (SPIRIVA) 18 MCG inhalation capsule, Place 1 capsule (18 mcg total) into inhaler and inhale daily as needed (shortness of breath)., Disp: 30 capsule, Rfl: 12   venlafaxine XR (EFFEXOR-XR) 75 MG 24 hr capsule, Take 75 mg by mouth 2 (two) times daily., Disp: , Rfl:    vitamin B-12 (CYANOCOBALAMIN) 1000 MCG tablet, Take 1,000 mcg by mouth 2 (two) times daily. , Disp: , Rfl:    albuterol (ACCUNEB) 0.63 MG/3ML nebulizer solution, Take 1 ampule by nebulization every 6 (six) hours as needed for wheezing., Disp: , Rfl:    albuterol (PROVENTIL HFA;VENTOLIN HFA) 108 (90 Base) MCG/ACT inhaler, Inhale 2 puffs into the lungs every 6 (six) hours as needed. (Patient taking differently: Inhale 2 puffs into the lungs every 6 (six) hours as needed for wheezing or shortness of breath.), Disp: 1 Inhaler, Rfl: 0   gabapentin (NEURONTIN) 300 MG capsule, Take 300 mg by mouth 2 (two) times daily as  needed (pain/restlessness)., Disp: , Rfl:    hydrOXYzine (VISTARIL) 25 MG capsule, Take 25 mg by mouth at bedtime as needed., Disp: , Rfl:    Menthol, Topical Analgesic, (BENGAY EX), Apply 1 application topically daily as needed (back pain)., Disp: , Rfl:   Vitals   Vitals:   03/07/22 0800 03/07/22 0943 03/07/22 1000 03/07/22 1348  BP: 113/64   107/62  Pulse: 74 77  94  Resp: 17   17  Temp:   98.9 F (37.2 C)   TempSrc:   Axillary   SpO2: 94%   96%  Weight:      Height:         Body mass index is 24.37 kg/m.  Physical Exam   Physical Exam Gen: alert, in mitts Resp: CTAB, no w/r/r CV: RRR, no m/g/r; nml S1 and S2. 2+ symmetric peripheral pulses.  Neuro: *MS: alert, combative, oriented to self, follows commands for husband only *Speech: moderate dysarthria, does not name or repeat *CN: PERRL, blinks to threat bilat, EOMI, face symmetric at rest, hearing moderately impaired to voice *Motor: moves all extremities anti-gravity to command *Sensory: SILT *Reflexes:  2+ symm, toes w/d only bilat *Coordination, gait: UTA  Labs   CBC:  Recent Labs  Lab 03/06/22 2212  WBC 9.3  NEUTROABS 7.4  HGB 10.9*  HCT 36.6  MCV 83.4  PLT 248    Basic Metabolic Panel:  Lab Results  Component Value Date   NA 139 03/07/2022   K 3.2 (L) 03/07/2022   CO2 24 03/07/2022   GLUCOSE 82 03/07/2022   BUN 18 03/07/2022   CREATININE 0.95 03/07/2022   CALCIUM 7.2 (L) 03/07/2022   GFRNONAA >60 03/07/2022   GFRAA >60 04/12/2018   Lipid Panel:  Lab Results  Component Value Date   LDLCALC 65 10/12/2020   HgbA1c:  Lab Results  Component Value Date   HGBA1C 7.0 (H) 10/23/2021   Urine Drug Screen: No results found for: "LABOPIA", "COCAINSCRNUR", "LABBENZ", "AMPHETMU", "THCU", "LABBARB"  Alcohol Level     Component Value Date/Time   [facility] <10 01/08/2022 2500     Impression   This is a 72 yo woman with hx dementia with behavioral disturbance, prior CVA, COPD on home O2 3L, CAD,,  systolic heart failure secondary to dilated cardiomyopathy status post AICD, OSA, hypothyroidism, hypertension, anxiety, diabetes, hospitalized from December 5 through January 17, 2022 with status epilepticus complicated by polypharmacy, intubated at that time, who presented to the ED for altered mental status and breakthrough seizures after keppra was decreased from 1000mg  bid to 500mg  daily 2/2 lethargy and diarrhea. She is at high risk of seizure recurrence and needs to be on an AED. Dr. Melrose Nakayama suggested lamotrigine in his last note but given she had 2 seizures yesterday we need an AED that does not take so long to uptitrate. Given her behavioral disturbance 2/2 dementia I think that depakote would be a good choice for her and husband is in agreement. Transaminases are normal.   Recommendations   - Depakote load 1000mg  IV f/b depakote DR sprinkle 250mg  q 12 hrs - D/c keppra - Will f/u tmrw ______________________________________________________________________   Thank you for the opportunity to take part in the care of this patient. If you have any further questions, please contact the neurology consultation attending.  Signed,  Su Monks, MD [facility] [phone]  If 7pm- 7am, please page neurology on call as listed in [place].  **Any copied and pasted documentation in this note was written by me in another application not billed for and pasted by me into this document.

## 2022-03-07 NOTE — ED Notes (Signed)
Message sent to pharmacy for missing [facility] medication several minutes ago.

## 2022-03-07 NOTE — Assessment & Plan Note (Addendum)
Received calcium gluconate during the hospital course.

## 2022-03-07 NOTE — Assessment & Plan Note (Addendum)
Restart low-dose Toprol at night.  Restart low-dose losartan at night.

## 2022-03-07 NOTE — ED Notes (Signed)
Report given to Kate, RN

## 2022-03-08 ENCOUNTER — Ambulatory Visit: Payer: Medicare HMO | Admitting: Cardiovascular Disease

## 2022-03-08 DIAGNOSIS — R531 Weakness: Secondary | ICD-10-CM | POA: Diagnosis not present

## 2022-03-08 DIAGNOSIS — G9341 Metabolic encephalopathy: Secondary | ICD-10-CM | POA: Diagnosis not present

## 2022-03-08 DIAGNOSIS — I4729 Other ventricular tachycardia: Secondary | ICD-10-CM | POA: Insufficient documentation

## 2022-03-08 DIAGNOSIS — G40919 Epilepsy, unspecified, intractable, without status epilepticus: Secondary | ICD-10-CM | POA: Diagnosis not present

## 2022-03-08 LAB — GASTROINTESTINAL PANEL BY PCR, STOOL (REPLACES STOOL CULTURE)

## 2022-03-08 LAB — BASIC METABOLIC PANEL
Anion gap: 7 (ref 5–15)
BUN: 15 mg/dL (ref 8–23)
CO2: 24 mmol/L (ref 22–32)
Calcium: 7.4 mg/dL — ABNORMAL LOW (ref 8.9–10.3)
Chloride: 110 mmol/L (ref 98–111)
Creatinine, Ser: 0.94 mg/dL (ref 0.44–1.00)
GFR, Estimated: >60 mL/min (ref >60)
Glucose, Bld: 67 mg/dL — ABNORMAL LOW (ref 70–99)
Potassium: 3.9 mmol/L (ref 3.5–5.1)
Sodium: 141 mmol/L (ref 135–145)

## 2022-03-08 LAB — C DIFFICILE QUICK SCREEN W PCR REFLEX
C Diff antigen: NEGATIVE
C Diff interpretation: NOT DETECTED
C Diff toxin: NEGATIVE

## 2022-03-08 LAB — PHOSPHORUS: Phosphorus: 2.9 mg/dL (ref 2.5–4.6)

## 2022-03-08 LAB — MAGNESIUM: Magnesium: 2.3 mg/dL (ref 1.7–2.4)

## 2022-03-08 MED ORDER — DEXTROSE-NACL 5-0.9 % IV SOLN: INTRAVENOUS | Status: DC

## 2022-03-08 MED ORDER — METOPROLOL SUCCINATE ER 25 MG PO TB24
12.5000 mg | ORAL_TABLET | Freq: Every day | ORAL | Status: DC
  Administered 2022-03-08 – 2022-03-09 (×2): 12.5 mg via ORAL
  Filled 2022-03-08 (×2): qty 1

## 2022-03-08 NOTE — Evaluation (Signed)
Physical Therapy Evaluation Patient Details Name: Stacy Grimes MRN: [id number] DOB: March 11, 1950 Today's Date: 03/08/2022  History of Present Illness  Stacy Grimes is a 72 y.o. female with medical history significant for dementia and prior CVA, COPD on home O2 at 3 L, CAD, systolic CHF secondary to dilated cardiomyopathy s/p AICD, OSA, hypothyroidism, HTN, anxiety, diabetes, hospitalized from 12/5 to 01/17/2022 with status epilepticus complicated by polypharmacy, requiring ventilator support, improving with the discontinuation of several medications, who presents to the ED.ED for altered mental status and seizure-like activity that started an hour prior to arrival of EMS   Clinical Impression  Patient received sitting up in bed with breakfast tray in front of her stating she is hungry. (Not attempting to eat at all) Patient is agreeable to attempt mobilization. Patient required mod A for supine to sit. Upon sitting edge of bed patient reports dizziness and returned self to supine. Patient unable to tolerate sitting up for any length of time. Unable to check orthostatics as patient on enteric precautions. Will check next visit if needed. Patient will continue to benefit from skilled PT to improve functional independence and safety.        Recommendations for follow up therapy are one component of a multi-disciplinary discharge planning process, led by the attending physician.  Recommendations may be updated based on patient status, additional functional criteria and insurance authorization.  Follow Up Recommendations Skilled nursing-short term rehab (<3 hours/day) Can patient physically be transported by private vehicle: No    Assistance Recommended at Discharge Frequent or constant Supervision/Assistance  Patient can return home with the following  A lot of help with walking and/or transfers;A lot of help with bathing/dressing/bathroom    Equipment Recommendations Other (comment) (TBD)   Recommendations for Other Services       Functional Status Assessment Patient has had a recent decline in their functional status and demonstrates the ability to make significant improvements in function in a reasonable and predictable amount of time.     Precautions / Restrictions Precautions Precautions: Fall Restrictions Weight Bearing Restrictions: No      Mobility  Bed Mobility Overal bed mobility: Needs Assistance Bed Mobility: Supine to Sit     Supine to sit: Mod assist, HOB elevated     General bed mobility comments: patient able to sit edge of bed briefly. Reported significant dizziness, and self returned to supine. Attempted to sit up again and again reports dizziness and unable to tolerate sitting.    Transfers                   General transfer comment: unable    Ambulation/Gait               General Gait Details: unable to attempt this date due to dizziness  Stairs            Wheelchair Mobility    Modified Rankin (Stroke Patients Only)       Balance Overall balance assessment: Needs assistance Sitting-balance support: Feet supported, Bilateral upper extremity supported Sitting balance-Leahy Scale: Poor                                       Pertinent Vitals/Pain Pain Assessment Pain Assessment: No/denies pain    Home Living Family/patient expects to be discharged to:: Private residence Living Arrangements: Spouse/significant other Available Help at Discharge: Family;Available 24 hours/day Type of Home: Mobile  home Home Access: Ramped entrance       Home Layout: One level Home Equipment: Rolling Walker (2 wheels);BSC/3in1;Rollator (4 wheels);Cane - single point Additional Comments: setup per chart from prior admission, patient poor historian and reported that she did not use AD.    Prior Function Prior Level of Function : Needs assist       Physical Assist : Mobility (physical);ADLs (physical)              Hand Dominance        Extremity/Trunk Assessment   Upper Extremity Assessment Upper Extremity Assessment: Generalized weakness    Lower Extremity Assessment Lower Extremity Assessment: Generalized weakness       Communication   Communication: HOH  Cognition Arousal/Alertness: Awake/alert Behavior During Therapy: WFL for tasks assessed/performed Overall Cognitive Status: History of cognitive impairments - at baseline                                          General Comments      Exercises     Assessment/Plan    PT Assessment Patient needs continued PT services  PT Problem List Decreased strength;Decreased balance;Decreased mobility;Decreased activity tolerance;Decreased coordination;Decreased cognition       PT Treatment Interventions      PT Goals (Current goals can be found in the Care Plan section)  Acute Rehab PT Goals Patient Stated Goal: none stated PT Goal Formulation: Patient unable to participate in goal setting Time For Goal Achievement: 03/22/22    Frequency Min 2X/week     Co-evaluation               AM-PAC PT "6 Clicks" Mobility  Outcome Measure Help needed turning from your back to your side while in a flat bed without using bedrails?: A Little Help needed moving from lying on your back to sitting on the side of a flat bed without using bedrails?: A Lot Help needed moving to and from a bed to a chair (including a wheelchair)?: A Lot Help needed standing up from a chair using your arms (e.g., wheelchair or bedside chair)?: Total Help needed to walk in hospital room?: Total Help needed climbing 3-5 steps with a railing? : Total 6 Click Score: 10    End of Session   Activity Tolerance: Other (comment) (limited by dizziness upon sitting up) Patient left: in bed;with bed alarm set Nurse Communication: Mobility status PT Visit Diagnosis: Muscle weakness (generalized) (M62.81);Other abnormalities of gait and  mobility (R26.89)    Time: 6712-4580 PT Time Calculation (min) (ACUTE ONLY): 10 min   Charges:   PT Evaluation $PT Eval Moderate Complexity: 1 Mod          Renata Gambino, PT, GCS 03/08/22,11:08 AM

## 2022-03-08 NOTE — Assessment & Plan Note (Addendum)
Physical therapy recommending rehab.  Husband will want to take home with home health.  Patient transported to home.

## 2022-03-08 NOTE — Progress Notes (Signed)
Progress Note   Patient: Stacy Grimes FHL:[id number] DOB: [birth year] DOA: 03/06/2022     1 DOS: the patient was seen and examined on 03/08/2022   Brief hospital course:  72 y.o. female with medical history significant for dementia and prior CVA, COPD on home O2 at 3 L, CAD, systolic CHF secondary to dilated cardiomyopathy s/p AICD, OSA, hypothyroidism, HTN, anxiety, diabetes, hospitalized from 12/5 to 01/17/2022 with status epilepticus complicated by polypharmacy, requiring ventilator support, improving with the discontinuation of several medications, who presents to the ED.ED for altered mental status and seizure-like activity that started an hour prior to arrival of EMS.  On arrival of EMS she was awake and alert but appeared slow to respond to questions and had intermittent shaking.  After being loaded into the ambulance she went on to have a grand mal seizure lasting 1 to 2 minutes aborted with IM Versed. ED course and data review: Vitals unremarkable in the ED.  Labs notable for hypocalcemia of 6.2 and hypomagnesemia of 0.9.  Creatinine was at baseline at 1.13 but with anion gap of 16 and bicarb of 16.  EKG, personally viewed and interpreted showing sinus tachycardia at 114 with nonspecific ST-T wave changes.  CT head showed no acute intracranial abnormality and chest x-ray with no active disease. Patient was loaded with Keppra 1 g given calcium gluconate 1 g, magnesium sulfate 2 g as well as oral potassium.  Hospitalist consulted for admission for seizure altered mental status.  2/2.  Neurology switch Keppra over to Depakote.  Mental status more improved today than yesterday.  Has been to evaluate her and see how she is doing with the new medication.  Assessment and Plan: * Generalized weakness Physical therapy recommending rehab.  Husband will want to take home with home health.  He will come to evaluate to see how she is doing and then hopefully can get out of the hospital with home health  potentially over the weekend.  Breakthrough seizure [facility]) [place] neurology consultation.  EEG canceled yesterday secondary to agitation.  As per neurology no need for getting the EEG.  Keppra discontinued and patient started on Depakote.  As per neurology okay for discharge.  Acute metabolic encephalopathy Likely secondary to seizure.  Mental status improved today.  Not agitated when I saw her.  Hypomagnesemia Replaced  Hypokalemia Replaced  Hypocalcemia Received calcium gluconate Monitor and replete as needed  Chronic systolic CHF (congestive heart failure) (HCC) Dilated cardiomyopathy s/p AICD [facility] Jude) Clinically euvolemic Last EF 40 to 45% September 2023 Restart low-dose Toprol at night Continue digoxin  Chronic obstructive pulmonary disease, unspecified COPD type (HCC) Chronic respiratory failure on 3 L Not acutely exacerbated and home O2 requirement at baseline Continue home inhalers with DuoNebs as needed  Dementia (HCC) Continue donepezil, mirtazapine and memantine  NSVT (nonsustained ventricular tachycardia) (HCC) 4 beats of nonsustained ventricular tachycardia documented by nursing staff last night.  Electrolytes better today.  Will restart low-dose Toprol at night.  Diarrhea Send off stool studies to see if infectious cause rather than from seizure medication.  Depression Continue Effexor  AAA (abdominal aortic aneurysm) (HCC) No acute issues suspected  Hypothyroidism Continue levothyroxine  Hypertension Restart low-dose Toprol at night        Subjective: Patient seen in conjunction with speech therapy and nursing staff.  Was eating graham crackers when I came in.  Did not eat much breakfast.  Physical Exam: Vitals:   03/08/22 0100 03/08/22 0540 03/08/22 0812 03/08/22 1148  BP:  97/79 (!) 112/55 97/85 117/67  Pulse: 77 91 91 93  Resp: 18 18 20 17   Temp: 98 F (36.7 C) 98 F (36.7 C) 97.8 F (36.6 C) 98.5 F (36.9 C)  TempSrc:    Oral   SpO2: 95% 95% 95% 96%  Weight:      Height:       Physical Exam HENT:     Head: Normocephalic.     Mouth/Throat:     Pharynx: No oropharyngeal exudate.  Eyes:     General: Lids are normal.     Conjunctiva/sclera: Conjunctivae normal.  Cardiovascular:     Rate and Rhythm: Normal rate and regular rhythm.     Heart sounds: Normal heart sounds, S1 normal and S2 normal.  Pulmonary:     Breath sounds: No decreased breath sounds, wheezing, rhonchi or rales.  Abdominal:     Palpations: Abdomen is soft.     Tenderness: There is no abdominal tenderness.  Musculoskeletal:     Right lower leg: No swelling.     Left lower leg: No swelling.  Skin:    General: Skin is warm.     Findings: No rash.  Neurological:     Mental Status: She is alert.     Comments: Able to lift her legs up off the bed to command.     Data Reviewed: Creatinine 0.94, stool studies negative  Family Communication: Spoke with husband on the phone twice today  Disposition: Status is: Inpatient Remains inpatient appropriate because: Watch diet intake today.  Patient's husband will come and evaluate her today and see how she is mental status wise.  He would like to take home rather than to rehab.  Planned Discharge Destination: Home with Home Health    Time spent: 28 minutes  Author: Loletha Grayer, MD 03/08/2022 3:50 PM  For on call review [email or web address].

## 2022-03-08 NOTE — TOC Initial Note (Signed)
Transition of Care [facility]) - Initial/Assessment Note    Patient Details  Name: Stacy Grimes MRN: [id number] Date of Birth: Sep 26, 1950  Transition of Care [facility]) CM/SW Contact:    Gerilyn Pilgrim, LCSW Phone Number: 03/08/2022, 1:45 PM  Clinical Narrative:    CSW spoke with pt husband about recommendations for SNF. Pt husband reports they would like to take pt home. Husband reports they were in with [facility] prior to admission and wanted to resume with them. CSW sent message for MD to enter resumption of care orders for PT/OT/RN/AID. Merleen Nicely with [facility] notified. Husband reports that they are still seeing Cyndi Bender for primary care and that they would like any medications sent to [facility]. Husband reports they have a rollator, BSC, and RW at home and do not feel they need any further DME. No additional needs at this time.                Expected Discharge Plan: [place] Barriers to Discharge: Continued Medical Work up   Patient Goals and CMS Choice Patient states their goals for this hospitalization and ongoing recovery are:: return home with [facility] CMS Medicare.gov Compare Post Acute Care list provided to:: Patient Represenative (must comment) (husband) Choice offered to / list presented to : Spouse      Expected Discharge Plan and Services       Living arrangements for the past 2 months: Single Family Home                               Date [facility] Agency Contacted: 03/08/22 Time [place]: 31 Representative spoke with at [place]: [place]  Prior Living Arrangements/Services Living arrangements for the past 2 months: [place] Lives with:: Spouse Patient language and need for interpreter reviewed:: Yes Do you feel safe going back to the place where you live?: Yes      Need for Family Participation in Patient Care: Yes (Comment) Care giver support system in place?: Yes (comment) Current home services: Home OT, Home PT,  Home RN, Homehealth aide Criminal Activity/Legal Involvement Pertinent to Current Situation/Hospitalization: No - Comment as needed  Activities of Daily Living Home Assistive Devices/Equipment: Oxygen, Walker (specify type), Eyeglasses ADL Screening (condition at time of admission) Patient's cognitive ability adequate to safely complete daily activities?: No Is the patient deaf or have difficulty hearing?: No Does the patient have difficulty seeing, even when wearing glasses/contacts?: No Does the patient have difficulty concentrating, remembering, or making decisions?: Yes Patient able to express need for assistance with ADLs?: No Does the patient have difficulty dressing or bathing?: Yes Independently performs ADLs?: No Communication: Independent Dressing (OT): Needs assistance Is this a change from baseline?: Change from baseline, expected to last <3days Grooming: Needs assistance Is this a change from baseline?: Change from baseline, expected to last <3 days Feeding: Needs assistance Is this a change from baseline?: Change from baseline, expected to last <3 days Bathing: Needs assistance Is this a change from baseline?: Change from baseline, expected to last <3 days Toileting: Needs assistance Is this a change from baseline?: Change from baseline, expected to last <3 days In/Out Bed: Needs assistance Is this a change from baseline?: Change from baseline, expected to last <3 days Walks in Home: Needs assistance Is this a change from baseline?: Change from baseline, expected to last <3 days Does the patient have difficulty walking or climbing stairs?: Yes  Weakness of Legs: Both Weakness of Arms/Hands: Both  Permission Sought/Granted Permission sought to share information with : Family Supports    Share Information with NAME: husband  Permission granted to share info w AGENCY: [facility]        Emotional Assessment              Admission diagnosis:  Hypocalcemia  [E83.51] Hypomagnesemia [E83.42] Seizure ([place]) [R56.9] Seizure-like activity ([place]) [R56.9] Breakthrough seizure ([place]) [G40.919] Altered mental status, unspecified altered mental status type [R41.82] Patient Active Problem List   Diagnosis Date Noted   Breakthrough seizure ([place]) 03/07/2022   Hypomagnesemia 03/07/2022   Hypocalcemia 03/07/2022   Chronic respiratory failure with hypoxia ([place]) 66/07/3014   Acute metabolic encephalopathy 02/12/3233   Hypothyroidism 03/07/2022   AAA (abdominal aortic aneurysm) ([place]) 03/07/2022   Depression 03/07/2022   Diarrhea 03/07/2022   Seizure ([place]) 03/07/2022   Altered mental status 03/07/2022   ICD (implantable cardioverter-defibrillator) in place 03/07/2022   Seizure-like activity ([place]) 03/07/2022   Malnutrition of moderate degree 01/11/2022   Seizures ([place]) 01/08/2022   Acute on chronic respiratory failure with hypoxia and hypercapnia ([place]) 01/08/2022   Hypokalemia 01/08/2022   Wide-complex tachycardia 01/08/2022   Status epilepticus ([place]) 01/08/2022   Femur fracture ([place]) 12/10/2021   Fall 12/10/2021   Dementia ([place]) 12/10/2021   COVID-19 virus infection 12/10/2021   Shock circulatory ([place])    Hip fracture, unspecified laterality, closed, initial encounter ([place]) 10/23/2021   Hip fracture ([place]) 10/23/2021   CHF (congestive heart failure) () 10/23/2021   Orthostatic hypotension 08/06/2021   Chronic congestive heart failure (HCC)    Acute ischemic stroke ([place]) 06/21/2020   Chronic obstructive pulmonary disease, unspecified COPD type ([place]) 06/20/2020   Type 2 diabetes mellitus with hyperglycemia, unspecified whether long term insulin use ([place]) 06/20/2020   Left knee pain 12/16/2019   Elevated troponin I level 12/15/2019   Dehydration 04/12/2018   Syncope 04/09/2018   Benign neoplasm of colon 08/31/2012   Diverticulosis of colon (without mention of hemorrhage) 08/31/2012   Fatigue 08/10/2012   Personal history of colonic polyps  07/02/2012   Dysphagia, unspecified(787.20) 07/02/2012   Unspecified constipation 07/02/2012   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 07/09/2010   Hypertension 03/24/2009   SLEEP APNEA, OBSTRUCTIVE 03/23/2009   Dilated cardiomyopathy ([place]) 57/32/2025   Chronic systolic CHF (congestive heart failure) ([place]) 03/23/2009   PALPITATIONS 03/23/2009   MURMUR 03/23/2009   COUGH 03/23/2009   PCP:  Cyndi Bender, PA-C Pharmacy:   [facility] [place], [place] - [address] [place] [address] Phone: [phone] Fax: [facility] [address] [address] Phone: [phone] Fax: [phone]     Social Determinants of Health (SDOH) Social History: [place]: No Food Insecurity (03/07/2022)  Housing: Low Risk  (03/07/2022)  Transportation Needs: No Transportation Needs (03/07/2022)  Utilities: Not At Risk (03/07/2022)  Depression (PHQ2-9): Low Risk  (09/01/2019)  Tobacco Use: Medium Risk (03/07/2022)   SDOH Interventions:     Readmission Risk Interventions    03/08/2022    1:45 PM  Readmission Risk Prevention Plan  Transportation Screening Complete  Medication Review (RN Care Manager) Complete  PCP or Specialist appointment within 3-5 days of discharge Complete  HRI or [place] Complete  SW Recovery Care/Counseling Consult Complete  Skilled Nursing Facility Complete

## 2022-03-08 NOTE — Plan of Care (Signed)
Telemetry called and sd pt had a 4 beat run of non-sustained VTach.  Notified on call NP.  No new orders. Pt is resting comfortably.

## 2022-03-08 NOTE — Progress Notes (Signed)
Speech Language Pathology Treatment: Dysphagia  Patient Details Name: Stacy Grimes MRN: [id number] DOB: 10-Feb-1950 Today's Date: 03/08/2022 Time: 9485-4627 SLP Time Calculation (min) (ACUTE ONLY): 42 min  Assessment / Plan / Recommendation Clinical Impression  Pt seen for ongoing assessment of swallowing and toleration of diet today. She was much more awake, alert, and cooperative in following through w/ tasks; min less Confused in her behaviors vs at BSE. She has Baseline Dementia per chart/MD. She continues to require min-mod verbal/tactile cues to follow through w/ tasks. Min HOH also. Per Husband, pt has been drinking THIN LIQUIDS at home since discharge from last hospitalization in Dec. 2023. She drinks "water and sodas", per Husband. He denied pt having any coughing/choking and "she drinks a lot of it".  CXR is clear this admit.    Pt is on RA, WBC WNL. Afebrile.   Pt appears to present w/ grossly functional oropharyngeal phase swallowing w/ No overt, clinical s/s of aspiration nor overt neuromuscular deficits noted. Pt consumed po trials w/ No immediate, overt s/s of aspiration during/post po trials.  However, pt presents w/ Confusion(no Mitts today) w/ a Baseline of Dementia. ANY Cognitive decline can impact awareness/timing of swallowing, oral clearing, and overall oral intake. Pt is also Edentulous which impacts effective mastication of solids. She requires support w/ tray setup and initiation of feeding. All of these factors can increase risk for aspiration, dysphagia as well as decreased oral intake overall.     In hopes to upgrade diet consistency today, pt was given trials of thin liquids, purees, and mech soft foods. She consumed all consistencies w/ no overt coughing, decline in vocal quality, or change in respiratory presentation during/post trials. O2 sats remained 97%.. Oral phase appeared [facility] w/ trials of liquids and puree for timely bolus management and control of bolus  propulsion for A-P transfer for swallowing. Increased Time taken for mashing/gumming of increased textured mech soft trials, but chopping and moistening the trials appeared to aid oral phase management. Oral clearing achieved w/ all trial consistencies.  Again, no OM weakness note. Pt required feeding support d/t Bilat. Mitts and Confusion.    Recommend upgrade to a Dysphagia level 3 w/ thin liquids; pt should feed self as able. Recommend general aspiration precautions- reduce distractions, ensure oral clearing. Support w/ feeding if needed; Supervision at meals.  Pills WHOLE vs CRUSHED in Puree for safer, easier swallowing.  Education given on Pills in Puree; food consistencies and diet; general aspiration precautions to pt, Husband, and NSG. Unsure of pt's level of comprehension in setting of Dementia. ST services will f/u tomorrow for ongoing toleration of diet. NSG agreed. MD arrived to room during session and agreed.     HPI HPI: Pt is a 72 y.o. female with medical history significant for Dementia and prior CVA, COPD on home O2 at 3 L, CAD, systolic CHF secondary to dilated cardiomyopathy s/p AICD, OSA, hypothyroidism, HTN, anxiety, diabetes, hospitalized from 12/5 to 01/17/2022 with status epilepticus complicated by polypharmacy, requiring ventilator support, improving with the discontinuation of several medications, who presents to the ED for altered mental status and seizure-like activity at Home that started an hour prior to arrival of EMS.  On arrival of EMS to the house, she was awake and alert but appeared slow to respond to questions and had intermittent shaking.  After being loaded into the ambulance, she went on to have a grand mal seizure lasting 1 to 2 minutes aborted with IM Versed.  CT head showed  no acute intracranial abnormality and chest x-ray with no active disease.  Patient was loaded with Keppra 1 g given calcium gluconate 1 g, magnesium sulfate 2 g as well as oral potassium.   CXR:  No active disease.      SLP Plan  Continue with current plan of care      Recommendations for follow up therapy are one component of a multi-disciplinary discharge planning process, led by the attending physician.  Recommendations may be updated based on patient status, additional functional criteria and insurance authorization.    Recommendations  Diet recommendations: Dysphagia 3 (mechanical soft);Thin liquid Liquids provided via: Cup;Straw Medication Administration: Whole meds with puree (vs need to CRUSH in Puree) Supervision: Patient able to self feed;Staff to assist with self feeding;Intermittent supervision to cue for compensatory strategies Compensations: Minimize environmental distractions;Slow rate;Small sips/bites;Lingual sweep for clearance of pocketing;Follow solids with liquid Postural Changes and/or Swallow Maneuvers: Out of bed for meals;Seated upright 90 degrees;Upright 30-60 min after meal                General recommendations:  (Dietician f/u; Palliative Care f/u for [place]) Oral Care Recommendations: Oral care BID;Oral care before and after PO;Staff/trained caregiver to provide oral care Follow Up Recommendations: No SLP follow up Assistance recommended at discharge: Frequent or constant Supervision/Assistance (baseline Dementia) SLP Visit Diagnosis: Dysphagia, oral phase (R13.11) (baseline Dementia, Edentulous; requires cues/encouragement) Plan: Continue with current plan of care             Orinda Kenner, MS, [place]; [place] [phone] (ascom) Fadia Marlar  03/08/2022, 1:24 PM

## 2022-03-08 NOTE — Assessment & Plan Note (Signed)
4 beats of nonsustained ventricular tachycardia documented by nursing staff the other night.  Continue low-dose Toprol.  Continue electrolyte replacement.

## 2022-03-09 DIAGNOSIS — G40919 Epilepsy, unspecified, intractable, without status epilepticus: Secondary | ICD-10-CM | POA: Diagnosis not present

## 2022-03-09 DIAGNOSIS — R531 Weakness: Secondary | ICD-10-CM | POA: Diagnosis not present

## 2022-03-09 DIAGNOSIS — G9341 Metabolic encephalopathy: Secondary | ICD-10-CM | POA: Diagnosis not present

## 2022-03-09 LAB — CBC
HCT: 32 % — ABNORMAL LOW (ref 36.0–46.0)
Hemoglobin: 10 g/dL — ABNORMAL LOW (ref 12.0–15.0)
MCH: 25.1 pg — ABNORMAL LOW (ref 26.0–34.0)
MCHC: 31.3 g/dL (ref 30.0–36.0)
MCV: 80.2 fL (ref 80.0–100.0)
Platelets: 239 10*3/uL (ref 150–400)
RBC: 3.99 MIL/uL (ref 3.87–5.11)
RDW: 18.2 % — ABNORMAL HIGH (ref 11.5–15.5)
WBC: 8.4 10*3/uL (ref 4.0–10.5)
nRBC: 0 % (ref 0.0–0.2)

## 2022-03-09 LAB — BASIC METABOLIC PANEL
Anion gap: 7 (ref 5–15)
BUN: 12 mg/dL (ref 8–23)
CO2: 22 mmol/L (ref 22–32)
Calcium: 7.8 mg/dL — ABNORMAL LOW (ref 8.9–10.3)
Chloride: 109 mmol/L (ref 98–111)
Creatinine, Ser: 0.9 mg/dL (ref 0.44–1.00)
GFR, Estimated: >60 mL/min (ref >60)
Glucose, Bld: 74 mg/dL (ref 70–99)
Potassium: 4 mmol/L (ref 3.5–5.1)
Sodium: 138 mmol/L (ref 135–145)

## 2022-03-09 LAB — MAGNESIUM: Magnesium: 2.1 mg/dL (ref 1.7–2.4)

## 2022-03-09 LAB — LEVETIRACETAM LEVEL: Levetiracetam Lvl: 32.8 ug/mL (ref 10.0–40.0)

## 2022-03-09 MED ORDER — LOSARTAN POTASSIUM 25 MG PO TABS
12.5000 mg | ORAL_TABLET | Freq: Every day | ORAL | Status: DC
  Administered 2022-03-09: 12.5 mg via ORAL
  Filled 2022-03-09: qty 1

## 2022-03-09 NOTE — Progress Notes (Signed)
Progress Note   Patient: Stacy Grimes WFU:[id number] DOB: December 27, 1950 DOA: 03/06/2022     2 DOS: the patient was seen and examined on 03/09/2022   Brief hospital course:  72 y.o. female with medical history significant for dementia and prior CVA, COPD on home O2 at 3 L, CAD, systolic CHF secondary to dilated cardiomyopathy s/p AICD, OSA, hypothyroidism, HTN, anxiety, diabetes, hospitalized from 12/5 to 01/17/2022 with status epilepticus complicated by polypharmacy, requiring ventilator support, improving with the discontinuation of several medications, who presents to the ED.ED for altered mental status and seizure-like activity that started an hour prior to arrival of EMS.  On arrival of EMS she was awake and alert but appeared slow to respond to questions and had intermittent shaking.  After being loaded into the ambulance she went on to have a grand mal seizure lasting 1 to 2 minutes aborted with IM Versed. ED course and data review: Vitals unremarkable in the ED.  Labs notable for hypocalcemia of 6.2 and hypomagnesemia of 0.9.  Creatinine was at baseline at 1.13 but with anion gap of 16 and bicarb of 16.  EKG, personally viewed and interpreted showing sinus tachycardia at 114 with nonspecific ST-T wave changes.  CT head showed no acute intracranial abnormality and chest x-ray with no active disease. Patient was loaded with Keppra 1 g given calcium gluconate 1 g, magnesium sulfate 2 g as well as oral potassium.  Hospitalist consulted for admission for seizure altered mental status.  2/2.  Neurology switch Keppra over to Depakote.  Mental status more improved today than yesterday.  Has been to evaluate her and see how she is doing with the new medication. 2/3.   husband concerned about her being on a new medication and complaining of dizziness and nausea.  Patient did not complain of dizziness or nausea today.  Assessment and Plan: * Generalized weakness Physical therapy recommending rehab.  Husband  will want to take home with home health.  Continue working with physical therapy to try to get a little stronger.  Breakthrough seizure [facility]) [place] neurology consultation.  EEG canceled yesterday secondary to agitation.  As per neurology no need for getting the EEG.  Keppra discontinued and patient started on Depakote.  As per neurology okay for discharge.  Acute metabolic encephalopathy Likely secondary to seizure.  Mental status improved over the last 2 days  Hypomagnesemia Replaced  Hypokalemia Replaced  Hypocalcemia Received calcium gluconate Monitor and replete as needed  Chronic systolic CHF (congestive heart failure) (HCC) Dilated cardiomyopathy s/p AICD [facility] Jude) Clinically euvolemic Last EF 40 to 45% September 2023 Continue half dose Toprol at night restart losartan half dose at night also. Continue digoxin  Chronic obstructive pulmonary disease, unspecified COPD type ([place]) Chronic respiratory failure on 3 L Not acutely exacerbated and home O2 requirement at baseline Continue home inhalers with DuoNebs as needed  Dementia (HCC) Continue donepezil, mirtazapine and memantine  NSVT (nonsustained ventricular tachycardia) (HCC) 4 beats of nonsustained ventricular tachycardia documented by nursing staff last night.  Electrolytes better today.  Will restart low-dose Toprol at night.  Diarrhea Send off stool studies to see if infectious cause rather than from seizure medication.  Depression Continue Effexor  AAA (abdominal aortic aneurysm) (HCC) No acute issues suspected  Hypothyroidism Continue levothyroxine  Hypertension Restart low-dose Toprol at night.  Restart low-dose losartan at night.        Subjective: Patient feels okay.  Offers no complaints.  Staff was able to sit her up in the  chair.  Physical Exam: Vitals:   03/08/22 2351 03/09/22 0408 03/09/22 0847 03/09/22 1235  BP: (!) 115/53 118/67 136/61 (!) 122/58  Pulse: 98 (!) 103 99 98   Resp: 20 20 16 18   Temp: 98.4 F (36.9 C) 98.7 F (37.1 C) 97.8 F (36.6 C) 98.1 F (36.7 C)  TempSrc:      SpO2: 98% 97% 93% 99%  Weight:      Height:       Physical Exam HENT:     Head: Normocephalic.     Mouth/Throat:     Pharynx: No oropharyngeal exudate.  Eyes:     General: Lids are normal.     Conjunctiva/sclera: Conjunctivae normal.  Cardiovascular:     Rate and Rhythm: Normal rate and regular rhythm.     Heart sounds: Normal heart sounds, S1 normal and S2 normal.  Pulmonary:     Breath sounds: No decreased breath sounds, wheezing, rhonchi or rales.  Abdominal:     Palpations: Abdomen is soft.     Tenderness: There is no abdominal tenderness.  Musculoskeletal:     Right lower leg: No swelling.     Left lower leg: No swelling.  Skin:    General: Skin is warm.     Findings: No rash.  Neurological:     Mental Status: She is alert.     Comments: Able to lift her legs up off the bed to command.     Data Reviewed: Potassium 4, creatinine 0.9, magnesium 2.1, hemoglobin 10  Family Communication: Spoke with husband on the phone  Disposition: Status is: Inpatient Remains inpatient appropriate because: Patient's husband concerned about her strength dizziness and nausea with the new medication.  Planned Discharge Destination: Home with Home Health    Time spent: 27 minutes  Author: Loletha Grayer, MD 03/09/2022 1:55 PM  For on call review [email or web address]

## 2022-03-09 NOTE — Progress Notes (Signed)
Speech Language Pathology Treatment: Dysphagia  Patient Details Name: Stacy Grimes MRN: [id number] DOB: 1950-09-01 Today's Date: 03/09/2022 Time: 1010-1045 SLP Time Calculation (min) (ACUTE ONLY): 35 min  Assessment / Plan / Recommendation Clinical Impression  Pt seen for ongoing assessment of swallowing and toleration of diet today. Diet consistency upgraded yesterday to a mech soft consistency. She is awake, alert, and cooperative in following through w/ tasks; min less Confused in her behaviors vs at BSE. She has Baseline Dementia per chart/MD and is min HOH which can impact comprehension and follow through w/ tasks. She continues to require min verbal/tactile cues to follow through w/ tasks intermittently. Able to indicate wants/needs Bartholome Bill to get these tangles out, strawberry milkshake, can I call my husband?").  Per TC w/ Husband yesterday, pt has been drinking THIN LIQUIDS at home since discharge from last hospitalization in Dec. 2023. She drinks "water and sodas", per Husband. He denied pt having any coughing/choking and "she drinks a lot of it".  CXR is clear this admit.   Pt is on RA, WBC WNL. Afebrile.   Pt appears to present w/ grossly functional oropharyngeal phase swallowing w/ No overt, clinical s/s of aspiration nor overt neuromuscular deficits noted. Pt consumed po trials w/ No immediate, overt s/s of aspiration during/post po trials.  However, pt presents w/ Confusion w/ a Baseline of Dementia. ANY Cognitive decline can impact awareness/timing of swallowing, oral clearing, and overall oral intake. Pt is also Edentulous which impacts effective mastication of solids. She requires support w/ tray setup and positioning. All of these factors can increase risk for aspiration, dysphagia as well as decreased oral intake overall.     Pt consumed mech soft foods and thin liquids via straw w/ no overt coughing, decline in vocal quality, or change in respiratory presentation during/post  trials. O2 sats remained 98%. Oral phase appeared [facility] w/ trials of liquids and puree for timely bolus management and control of bolus propulsion for A-P transfer for swallowing. Min increased Time taken for mashing/gumming of increased textured mech soft trials, but chopping and moistening the trials appeared to aid oral phase management. Oral clearing achieved w/ all trial consistencies.  Pt fed self w/ tray setup.    Recommend continue a Dysphagia level 3(meats minced) w/ thin liquids; moistened foods for ease of mashing/gumming. Recommend general aspiration precautions- reduce distractions, ensure oral clearing. Support feeding if needed; Supervision at meals.  Pills WHOLE vs CRUSHED in Puree for safer, easier swallowing.  Education given on Pills in Puree; food consistencies and diet; general aspiration precautions to pt, Husband(last session), and NSG. Unsure of pt's level of comprehension in setting of Dementia. NSG agreed. MD arrived and agreed.      HPI HPI: Pt is a 72 y.o. female with medical history significant for Dementia and prior CVA, HOH, COPD on home O2 at 3 L, CAD, systolic CHF secondary to dilated cardiomyopathy s/p AICD, OSA, hypothyroidism, HTN, anxiety, diabetes, hospitalized from 12/5 to 01/17/2022 with status epilepticus complicated by polypharmacy, requiring ventilator support, improving with the discontinuation of several medications, who presents to the ED for altered mental status and seizure-like activity at Home that started an hour prior to arrival of EMS.  On arrival of EMS to the house, she was awake and alert but appeared slow to respond to questions and had intermittent shaking.  After being loaded into the ambulance, she went on to have a grand mal seizure lasting 1 to 2 minutes aborted with IM Versed.  CT  head showed no acute intracranial abnormality and chest x-ray with no active disease.  Patient was loaded with Keppra 1 g given calcium gluconate 1 g, magnesium sulfate  2 g as well as oral potassium.   CXR: No active disease.      SLP Plan  All goals met      Recommendations for follow up therapy are one component of a multi-disciplinary discharge planning process, led by the attending physician.  Recommendations may be updated based on patient status, additional functional criteria and insurance authorization.    Recommendations  Diet recommendations: Dysphagia 3 (mechanical soft);Thin liquid (Edentulous) Liquids provided via: Cup;Straw Medication Administration: Whole meds with puree (vs need to Crush in puree) Supervision: Patient able to self feed;Staff to assist with self feeding;Intermittent supervision to cue for compensatory strategies Compensations: Minimize environmental distractions;Slow rate;Small sips/bites;Lingual sweep for clearance of pocketing;Follow solids with liquid Postural Changes and/or Swallow Maneuvers: Out of bed for meals;Seated upright 90 degrees;Upright 30-60 min after meal                General recommendations:  (Dietician f/u; Palliative Care f/u for [place]) Oral Care Recommendations: Oral care BID;Oral care before and after PO;Staff/trained caregiver to provide oral care Follow Up Recommendations: No SLP follow up Assistance recommended at discharge: Frequent or constant Supervision/Assistance (baseline Dementia) SLP Visit Diagnosis: Dysphagia, oral phase (R13.11) (baseline Dementia, Edentulous; requires cues/encouragement; HOH) Plan: All goals met             Orinda Kenner, MS, CCC-SLP Speech Language Pathologist Rehab Services; [place] [phone] (ascom) Meghen Akopyan  03/09/2022, 11:01 AM

## 2022-03-10 DIAGNOSIS — G40919 Epilepsy, unspecified, intractable, without status epilepticus: Secondary | ICD-10-CM | POA: Diagnosis not present

## 2022-03-10 DIAGNOSIS — R110 Nausea: Secondary | ICD-10-CM | POA: Insufficient documentation

## 2022-03-10 DIAGNOSIS — R531 Weakness: Secondary | ICD-10-CM | POA: Diagnosis not present

## 2022-03-10 DIAGNOSIS — E876 Hypokalemia: Secondary | ICD-10-CM | POA: Diagnosis not present

## 2022-03-10 LAB — BASIC METABOLIC PANEL
Anion gap: 8 (ref 5–15)
BUN: 8 mg/dL (ref 8–23)
CO2: 23 mmol/L (ref 22–32)
Calcium: 8.2 mg/dL — ABNORMAL LOW (ref 8.9–10.3)
Chloride: 110 mmol/L (ref 98–111)
Creatinine, Ser: 0.8 mg/dL (ref 0.44–1.00)
GFR, Estimated: >60 mL/min (ref >60)
Glucose, Bld: 76 mg/dL (ref 70–99)
Potassium: 3 mmol/L — ABNORMAL LOW (ref 3.5–5.1)
Sodium: 141 mmol/L (ref 135–145)

## 2022-03-10 MED ORDER — POTASSIUM CHLORIDE 10 MEQ/100ML IV SOLN
10.0000 meq | Freq: Once | INTRAVENOUS | Status: AC
  Administered 2022-03-10: 10 meq via INTRAVENOUS
  Filled 2022-03-10: qty 100

## 2022-03-10 MED ORDER — DIGOXIN 125 MCG PO TABS: 0.1250 mg | ORAL_TABLET | Freq: Every day | ORAL | 0 refills | Status: DC

## 2022-03-10 MED ORDER — POTASSIUM CHLORIDE CRYS ER 20 MEQ PO TBCR: 20.0000 meq | EXTENDED_RELEASE_TABLET | Freq: Every day | ORAL | 0 refills | Status: DC

## 2022-03-10 MED ORDER — LOSARTAN POTASSIUM 25 MG PO TABS: 12.5000 mg | ORAL_TABLET | Freq: Every day | ORAL | Status: DC

## 2022-03-10 MED ORDER — METOPROLOL SUCCINATE ER 25 MG PO TB24: 25.0000 mg | ORAL_TABLET | Freq: Every day | ORAL | Status: DC

## 2022-03-10 MED ORDER — ONDANSETRON HCL 4 MG PO TABS: 4.0000 mg | ORAL_TABLET | Freq: Three times a day (TID) | ORAL | 0 refills | Status: DC | PRN

## 2022-03-10 MED ORDER — FAMOTIDINE 20 MG PO TABS: 20.0000 mg | ORAL_TABLET | Freq: Every day | ORAL | 0 refills | Status: AC

## 2022-03-10 MED ORDER — POLYVINYL ALCOHOL 1.4 % OP SOLN: 1.0000 [drp] | OPHTHALMIC | Status: DC | PRN

## 2022-03-10 MED ORDER — POTASSIUM CHLORIDE CRYS ER 20 MEQ PO TBCR
40.0000 meq | EXTENDED_RELEASE_TABLET | Freq: Once | ORAL | Status: AC
  Administered 2022-03-10: 40 meq via ORAL
  Filled 2022-03-10: qty 2

## 2022-03-10 MED ORDER — POLYVINYL ALCOHOL 1.4 % OP SOLN: 1.0000 [drp] | OPHTHALMIC | 0 refills | Status: DC | PRN

## 2022-03-10 MED ORDER — DIVALPROEX SODIUM 125 MG PO CSDR: 250.0000 mg | DELAYED_RELEASE_CAPSULE | Freq: Two times a day (BID) | ORAL | 0 refills | Status: DC

## 2022-03-10 NOTE — Progress Notes (Signed)
Notified by nursing staff that the patient's pharmacy did not have the Depakote and she is due for Depakote this evening.  Called [facility] [phone]. at [phone]: Depakote sprinkles 125 mg 2 capsules twice a day 1 month supply no refills.  Natural tears are over-the-counter.  Dr. Loletha Grayer

## 2022-03-10 NOTE — Assessment & Plan Note (Signed)
If nausea persist at home consider getting rid of Aricept and Namenda.  I did prescribe Zofran.

## 2022-03-10 NOTE — TOC Transition Note (Signed)
Transition of Care [facility]) - CM/SW Discharge Note   Patient Details  Name: Stacy Grimes MRN: [id number] Date of Birth: February 20, 1950  Transition of [facility]) CM/SW Contact:  Magnus Ivan, LCSW Phone Number: 03/10/2022, 9:29 AM   Clinical Narrative:    Patient to DC home today. Needs EMS transportation per MD. Lavera Guise who states they should be able to transport patient today, pick up scheduled for 11 (or later pending truck availability). Per [facility], the crew chief will call if they cannot take patient out of county today. Updated patient's husband who confirmed home address in [place] and that he will be home to receive patient. Updated Desiree with [facility]. HH orders are in. Updated RN and MD.    Final next level of care: [place] [facility] Barriers to Discharge: Continued Medical Work up   Patient Goals and CMS Choice CMS Medicare.gov Compare Post Acute Care list provided to:: Patient Represenative (must comment) (husband) Choice offered to / list presented to : Spouse  Discharge Placement                         Discharge Plan and Services Additional resources added to the After Visit Summary for                                Date [facility] Agency Contacted: 03/08/22 Time Media: 0144 Representative spoke with at [place]: [place] ([place]) Interventions SDOH Screenings   Food Insecurity: No Food Insecurity (03/07/2022)  Housing: Low Risk  (03/07/2022)  Transportation Needs: No Transportation Needs (03/07/2022)  Utilities: Not At Risk (03/07/2022)  Depression (PHQ2-9): Low Risk  (09/01/2019)  Tobacco Use: Medium Risk (03/07/2022)     Readmission Risk Interventions    03/08/2022    1:45 PM  Readmission Risk Prevention Plan  Transportation Screening Complete  Medication Review (RN Care Manager) Complete  PCP or Specialist appointment within 3-5 days of discharge Complete  HRI or [name] Complete  SW Recovery Care/Counseling Consult Complete  [place] Complete

## 2022-03-10 NOTE — Discharge Summary (Signed)
Physician Discharge Summary   Patient: Stacy Grimes MRN: [id number] DOB: 1951/01/27  Admit date:     03/06/2022  Discharge date: 03/10/22  Discharge Physician: Loletha Grayer   PCP: Cyndi Bender, PA-C   Recommendations at discharge:   Follow-up PCP 5 days Follow up with Dr. Caryl Comes cardiology  Discharge Diagnoses: Principal Problem:   Generalized weakness Active Problems:   Breakthrough seizure ([place])   Acute metabolic encephalopathy   Hypomagnesemia   Hypokalemia   Dilated cardiomyopathy (HCC)   Chronic systolic CHF (congestive heart failure) (HCC)   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   Hypocalcemia   Chronic obstructive pulmonary disease, unspecified COPD type ([place])   Chronic respiratory failure with hypoxia ([place])   Dementia (HCC)   Hypertension   Hypothyroidism   AAA (abdominal aortic aneurysm) (HCC)   Depression   Diarrhea   ICD (implantable cardioverter-defibrillator) in place   NSVT (nonsustained ventricular tachycardia) (HCC)   Nausea    Hospital Course:  72 y.o. female with medical history significant for dementia and prior CVA, COPD on home O2 at 3 L, CAD, systolic CHF secondary to dilated cardiomyopathy s/p AICD, OSA, hypothyroidism, HTN, anxiety, diabetes, hospitalized from 12/5 to 01/17/2022 with status epilepticus complicated by polypharmacy, requiring ventilator support, improving with the discontinuation of several medications, who presents to the ED.ED for altered mental status and seizure-like activity that started an hour prior to arrival of EMS.  On arrival of EMS she was awake and alert but appeared slow to respond to questions and had intermittent shaking.  After being loaded into the ambulance she went on to have a grand mal seizure lasting 1 to 2 minutes aborted with IM Versed. ED course and data review: Vitals unremarkable in the ED.  Labs notable for hypocalcemia of 6.2 and hypomagnesemia of 0.9.  Creatinine was at  baseline at 1.13 but with anion gap of 16 and bicarb of 16.  EKG, personally viewed and interpreted showing sinus tachycardia at 114 with nonspecific ST-T wave changes.  CT head showed no acute intracranial abnormality and chest x-ray with no active disease. Patient was loaded with Keppra 1 g given calcium gluconate 1 g, magnesium sulfate 2 g as well as oral potassium.  Hospitalist consulted for admission for seizure altered mental status.  2/2.  Neurology switch Keppra over to Depakote.  Mental status more improved today than yesterday.  Has been to evaluate her and see how she is doing with the new medication. 2/3.   husband concerned about her being on a new medication and complaining of dizziness and nausea.  Patient did not complain of dizziness or nausea today. 2/3.  Patient received oral and IV potassium.  Will continue potassium supplementation at home.  Resumed home health.  Discharged home in stable condition.  Recommend checking a BMP and magnesium and follow-up appointment.  Assessment and Plan: * Generalized weakness Physical therapy recommending rehab.  Husband will want to take home with home health.  Patient transported to home.  Breakthrough seizure [facility]) [place] neurology consultation.  As per neurology no need for getting the EEG.  Keppra discontinued and patient started on Depakote.  As per neurology okay for discharge.  Acute metabolic encephalopathy Likely secondary to seizure.  Mental status improved over the last 3 days  Hypomagnesemia Replaced  Hypokalemia Replaced with 1 run of potassium and oral potassium today.  Continue the potassium supplementation on a daily basis  Hypocalcemia Received calcium gluconate during the hospital course.  Chronic systolic CHF (congestive heart failure) (  [place]) Dilated cardiomyopathy s/p AICD [facility] Jude) Clinically euvolemic Last EF 40 to 45% September 2023 Continue half dose Toprol at night restart losartan half dose at night  also. Continue digoxin  Chronic obstructive pulmonary disease, unspecified COPD type ([place]) Chronic respiratory failure on 3 L Not acutely exacerbated and home O2 requirement at baseline Continue home inhalers with DuoNebs as needed  Dementia (HCC) Continue donepezil, mirtazapine and memantine  Nausea If nausea persist at home consider getting rid of Aricept and Namenda.  I did prescribe Zofran.  NSVT (nonsustained ventricular tachycardia) (HCC) 4 beats of nonsustained ventricular tachycardia documented by nursing staff the other night.  Continue low-dose Toprol.  Continue electrolyte replacement.  Diarrhea Send off stool studies to see if infectious cause rather than from seizure medication.  Depression Continue Effexor  AAA (abdominal aortic aneurysm) (HCC) No acute issues suspected  Hypothyroidism Continue levothyroxine  Hypertension Restart low-dose Toprol at night.  Restart low-dose losartan at night.         Consultants: Cardiology, neurology Procedures performed: None Disposition: Home health Diet recommendation:  Dysphagia 3 diet DISCHARGE MEDICATION: Allergies as of 03/10/2022       Reactions   Codeine Palpitations   Contrast Media [iodinated Contrast Media] Rash        Medication List     STOP taking these medications    BENGAY EX   levETIRAcetam 1000 MG tablet Commonly known as: KEPPRA   omeprazole 20 MG capsule Commonly known as: PRILOSEC       TAKE these medications    acetaminophen 650 MG CR tablet Commonly known as: TYLENOL Take 650 mg by mouth at bedtime.   albuterol 0.63 MG/3ML nebulizer solution Commonly known as: ACCUNEB Take 1 ampule by nebulization every 6 (six) hours as needed for wheezing. What changed: Another medication with the same name was changed. Make sure you understand how and when to take each.   albuterol 108 (90 Base) MCG/ACT inhaler Commonly known as: VENTOLIN HFA Inhale 2 puffs into the lungs every 6  (six) hours as needed. What changed: reasons to take this   allopurinol 100 MG tablet Commonly known as: ZYLOPRIM Take 1 tablet (100 mg total) by mouth daily.   atorvastatin 80 MG tablet Commonly known as: Lipitor Take 1 tablet (80 mg total) by mouth daily.   budesonide-formoterol 160-4.5 MCG/ACT inhaler Commonly known as: SYMBICORT Inhale 2 puffs into the lungs 2 (two) times daily.   clopidogrel 75 MG tablet Commonly known as: PLAVIX Take 1 tablet (75 mg total) by mouth daily.   cyanocobalamin 1000 MCG tablet Commonly known as: VITAMIN B12 Take 1,000 mcg by mouth 2 (two) times daily.   digoxin 0.125 MG tablet Commonly known as: LANOXIN Take 1 tablet (0.125 mg total) by mouth daily. Start taking on: March 11, 2022 What changed:  how much to take how to take this when to take this additional instructions   divalproex 125 MG capsule Commonly known as: DEPAKOTE SPRINKLE Take 2 capsules (250 mg total) by mouth every 12 (twelve) hours.   donepezil 10 MG tablet Commonly known as: ARICEPT Take 10 mg by mouth at bedtime.   famotidine 20 MG tablet Commonly known as: PEPCID Take 1 tablet (20 mg total) by mouth daily.   furosemide 20 MG tablet Commonly known as: LASIX Take 1 tablet (20 mg total) by mouth every other day. May take an additional tablet AS NEEDED for weight gain of 3 lbs overnight or 5 lbs in one week. Do not exceed  more than 3 additional doses.   gabapentin 300 MG capsule Commonly known as: NEURONTIN Take 300 mg by mouth 2 (two) times daily as needed (pain/restlessness).   hydrOXYzine 25 MG capsule Commonly known as: VISTARIL Take 25 mg by mouth at bedtime as needed.   levothyroxine 100 MCG tablet Commonly known as: SYNTHROID Take 100 mcg by mouth daily.   losartan 25 MG tablet Commonly known as: COZAAR Take 0.5 tablets (12.5 mg total) by mouth at bedtime. What changed: when to take this   memantine 10 MG tablet Commonly known as: NAMENDA Take  10 mg by mouth in the morning and at bedtime.   metoprolol succinate 25 MG 24 hr tablet Commonly known as: TOPROL-XL Take 1 tablet (25 mg total) by mouth at bedtime. What changed: when to take this   mirtazapine 7.5 MG tablet Commonly known as: REMERON Take 7.5 mg by mouth 2 (two) times daily.   ondansetron 4 MG tablet Commonly known as: Zofran Take 1 tablet (4 mg total) by mouth every 8 (eight) hours as needed for nausea or vomiting.   polyvinyl alcohol 1.4 % ophthalmic solution Commonly known as: LIQUIFILM TEARS Place 1 drop into both eyes as needed for dry eyes.   potassium chloride SA 20 MEQ tablet Commonly known as: KLOR-CON M Take 1 tablet (20 mEq total) by mouth daily.   tiotropium 18 MCG inhalation capsule Commonly known as: SPIRIVA Place 1 capsule (18 mcg total) into inhaler and inhale daily as needed (shortness of breath).   venlafaxine XR 75 MG 24 hr capsule Commonly known as: EFFEXOR-XR Take 75 mg by mouth 2 (two) times daily.        Follow-up Information     Cyndi Bender, PA-C Follow up in 5 day(s).   Specialty: Physician Assistant Contact information: [place] [address] [phone]         Deboraha Sprang, MD Follow up in 1 month(s).   Specialty: Cardiology Contact information: [place] [id number] [phone]                Discharge Exam: Danley Danker Weights   03/06/22 2207  Weight: 62.4 kg   Physical Exam HENT:     Head: Normocephalic.     Mouth/Throat:     Pharynx: No oropharyngeal exudate.  Eyes:     General: Lids are normal.     Conjunctiva/sclera: Conjunctivae normal.  Cardiovascular:     Rate and Rhythm: Normal rate and regular rhythm.     Heart sounds: Normal heart sounds, S1 normal and S2 normal.  Pulmonary:     Breath sounds: No decreased breath sounds, wheezing, rhonchi or rales.  Abdominal:     Palpations: Abdomen is soft.     Tenderness: There is no  abdominal tenderness.  Musculoskeletal:     Right lower leg: No swelling.     Left lower leg: No swelling.  Skin:    General: Skin is warm.     Findings: No rash.  Neurological:     Mental Status: She is alert.     Comments: Able to straight leg raise to command.  Able to pull herself up and sit up for me to listen to her lungs.      Condition at discharge: stable  The results of significant diagnostics from this hospitalization (including imaging, microbiology, ancillary and laboratory) are listed below for reference.   Imaging Studies: DG Chest Portable 1 View  Result Date: 03/06/2022  CLINICAL DATA:  Altered mental status EXAM: PORTABLE CHEST 1 VIEW COMPARISON:  01/15/2022 FINDINGS: Cardiac shadow is stable. Defibrillator is again noted. Lungs are well aerated bilaterally. No focal infiltrate or effusion is seen. No bony abnormality is noted. IMPRESSION: No active disease. Electronically Signed   By: Inez Catalina M.D.   On: 03/06/2022 22:52   CT Head Wo Contrast  Result Date: 03/06/2022 CLINICAL DATA:  Altered mental status EXAM: CT HEAD WITHOUT CONTRAST TECHNIQUE: Contiguous axial images were obtained from the base of the skull through the vertex without intravenous contrast. RADIATION DOSE REDUCTION: This exam was performed according to the departmental dose-optimization program which includes automated exposure control, adjustment of the mA and/or kV according to patient size and/or use of iterative reconstruction technique. COMPARISON:  CT head 01/08/2022 FINDINGS: Images are degraded by motion artifact Brain: No intracranial hemorrhage, mass effect, or evidence of acute infarct. No hydrocephalus. No extra-axial fluid collection. Generalized cerebral atrophy. Ill-defined hypoattenuation within the cerebral white matter is nonspecific but consistent with chronic small vessel ischemic disease. Chronic infarcts right temporal lobe, left parietal lobe and left occipital lobe. Vascular: No  hyperdense vessel. Intracranial arterial calcification. Skull: No fracture or focal lesion. Sinuses/Orbits: No acute finding. Paranasal sinuses and mastoid air cells are well aerated. Other: None. IMPRESSION: Motion degraded exam.  No definite acute intracranial abnormality. Electronically Signed   By: Placido Sou M.D.   On: 03/06/2022 22:42    Microbiology: Results for orders placed or performed during the hospital encounter of 03/06/22  Gastrointestinal Panel by PCR , Stool     Status: None   Collection Time: 03/07/22  6:48 AM   Specimen: Stool  Result Value Ref Range Status   Campylobacter species NOT DETECTED NOT DETECTED Final   Plesimonas shigelloides NOT DETECTED NOT DETECTED Final   Salmonella species NOT DETECTED NOT DETECTED Final   Yersinia enterocolitica NOT DETECTED NOT DETECTED Final   Vibrio species NOT DETECTED NOT DETECTED Final   Vibrio cholerae NOT DETECTED NOT DETECTED Final   Enteroaggregative E coli (EAEC) NOT DETECTED NOT DETECTED Final   Enteropathogenic E coli (EPEC) NOT DETECTED NOT DETECTED Final   Enterotoxigenic E coli (ETEC) NOT DETECTED NOT DETECTED Final   Shiga like toxin producing E coli (STEC) NOT DETECTED NOT DETECTED Final   Shigella/Enteroinvasive E coli (EIEC) NOT DETECTED NOT DETECTED Final   Cryptosporidium NOT DETECTED NOT DETECTED Final   Cyclospora cayetanensis NOT DETECTED NOT DETECTED Final   Entamoeba histolytica NOT DETECTED NOT DETECTED Final   Giardia lamblia NOT DETECTED NOT DETECTED Final   Adenovirus F40/41 NOT DETECTED NOT DETECTED Final   Astrovirus NOT DETECTED NOT DETECTED Final   Norovirus GI/GII NOT DETECTED NOT DETECTED Final   Rotavirus A NOT DETECTED NOT DETECTED Final   Sapovirus (I, II, IV, and V) NOT DETECTED NOT DETECTED Final    Comment: Performed at [facility], [address] [place], [place] [address]  C Difficile Quick Screen w PCR reflex     Status: None   Collection Time: 03/07/22  6:48 AM    Specimen: STOOL  Result Value Ref Range Status   C Diff antigen NEGATIVE NEGATIVE Final   C Diff toxin NEGATIVE NEGATIVE Final   C Diff interpretation No C. difficile detected.  Final    Comment: Performed at [facility], [address] [place], [place] [address]    Labs: CBC: Recent Labs  Lab 03/06/22 2212 03/09/22 0536  WBC 9.3 8.4  NEUTROABS 7.4  --  HGB 10.9* 10.0*  HCT 36.6 32.0*  MCV 83.4 80.2  PLT 275 290   Basic Metabolic Panel: Recent Labs  Lab 03/06/22 2212 03/07/22 0458 03/08/22 0450 03/09/22 0536 03/10/22 0449  NA 139 139 141 138 141  K 3.8 3.2* 3.9 4.0 3.0*  CL 107 106 110 109 110  CO2 16* 24 24 22 23   GLUCOSE 198* 82 67* 74 76  BUN 15 18 15 12 8   CREATININE 1.13* 0.95 0.94 0.90 0.80  CALCIUM 6.2* 7.2* 7.4* 7.8* 8.2*  MG 0.9* 1.7 2.3 2.1  --   PHOS  --   --  2.9  --   --    Liver Function Tests: Recent Labs  Lab 03/06/22 2212 03/07/22 0458  AST 32 17  ALT 8 9  ALKPHOS 152* 162*  BILITOT 0.7 0.3  PROT 6.5 6.9  ALBUMIN 2.3* 2.5*   CBG: No results for input(s): "GLUCAP" in the last 168 hours.  Discharge time spent: greater than 30 minutes.  Signed: Loletha Grayer, MD [facility] 03/10/2022

## 2022-03-10 NOTE — Progress Notes (Signed)
Patient discharged home via EMS. Report called to husband. All belongings sent home with patient. Peripheral IV removed.

## 2022-03-11 ENCOUNTER — Ambulatory Visit: Payer: Medicare HMO | Attending: Internal Medicine

## 2022-03-11 DIAGNOSIS — I5022 Chronic systolic (congestive) heart failure: Secondary | ICD-10-CM

## 2022-03-11 DIAGNOSIS — R4182 Altered mental status, unspecified: Secondary | ICD-10-CM | POA: Diagnosis not present

## 2022-03-11 DIAGNOSIS — Z95810 Presence of automatic (implantable) cardiac defibrillator: Secondary | ICD-10-CM

## 2022-03-11 NOTE — Progress Notes (Signed)
EPIC Encounter for ICM Monitoring  Patient Name: Stacy Grimes is a 72 y.o. female Date: 03/11/2022 Primary Care Physican: Cyndi Bender, PA-C Primary Cardiologist: Fletcher Anon Electrophysiologist: Vergie Living Pacing:  97%  10/12/2020 Office Weight: 184 lbs 03/05/2022 Weight:  Unable to weigh at home   AT/AF Burden <1% (taking Aspirin & Plavix)   Spoke with husband, [name] per DPR.  Pt was discharged from the hospital 2/4 and doing a little better since being discharged.  Pt is back on Furosemide and Potassium as prescribed. Hospitalized from 1/31-2/4.   Coruve thoracic impedance suggesting possible fluid accumulation starting 03/01/2022 which correlates with hospitalization.     Prescribed:  Furosemide 20 mg Take 1 tablet (20 mg total) by mouth every other day.  May take an additional tablet AS NEEDED for weight gain of 3 lbs overnight or 5 lbs in one week. Do not exceed more than 3 additional doses.   Potassium 20 mEq take 1 tablet daily    Labs: 03/10/2022 Creatinine 0.80, BUN 8,   Potassium 3.0, Sodium 141, GFR >60  03/09/2022 Creatinine 0.90, BUN 12, Potassium 4.0, Sodium 138, GFR >60  03/08/2022 Creatinine 0.94, BUN 15, Potassium 3.9, Sodium 141, GFR >60  03/07/2022 Creatinine 0.95, BUN 18, Potassium 3.2, Sodium 139, GFR >60 03/06/2022 Creatinine 1.13, BUN 15, Potassium 3.8, Sodium 139, GFR 52  01/16/2022 Creatinine 1.17, BUN 20, Potassium 3.9, Sodium 142, GFR 50 01/15/2022 Creatinine 1.17, BUN 14, Potassium 3.1, Sodium 143, GFR 50  01/14/2022 Creatinine 1.02, BUN 6,   Potassium 2.8, Sodium 143, GFR 58  A complete set of results can be found in Results Review.   Recommendations:   Advised to continue to take Furosemide and Potassium as prescribed.  Explained potassium level was low on 2/4 but was given extra Potassium before being discharged.  Did not advise any extra Furosemide at this time since Potassium has been low.     Follow-up plan: ICM clinic phone appointment on 03/18/2022 to  recheck fluid levels.  91 day device clinic remote transmission scheduled 04/04/2022.     EP/Cardiology Office Visits:   Canceled 03/08/2022 with Dr Fletcher Anon.     Last OV with Dr Caryl Comes was 07/16/2017.    Copy of ICM check sent to Dr. Caryl Comes and Dr Fletcher Anon for review.     3 month ICM trend: 03/11/2022.    12-14 Month ICM trend:     [name], RN 03/11/2022 10:33 AM

## 2022-03-13 ENCOUNTER — Telehealth: Payer: Self-pay | Admitting: Cardiovascular Disease

## 2022-03-13 NOTE — Telephone Encounter (Signed)
Yes, ok to switch manufacturers

## 2022-03-13 NOTE — Telephone Encounter (Signed)
Per Pharm D it is ok to switch manufacturers for Digoxin.  [facility] notified. Georgana Curio MHA RN CCM

## 2022-03-13 NOTE — Telephone Encounter (Signed)
Pt c/o medication issue:  1. Name of Medication: digoxin (LANOXIN) 0.125 MG tablet   2. How are you currently taking this medication (dosage and times per day)?    3. Are you having a reaction (difficulty breathing--STAT)? no  4. What is your medication issue? Pharmacy calling because needs to use a different brand for medication. Calling to make sure that's okay. Advise we werent the one that prescribe the medication but will send a message to dr. Please advise

## 2022-03-18 ENCOUNTER — Ambulatory Visit: Payer: Medicare HMO | Attending: Internal Medicine

## 2022-03-18 DIAGNOSIS — I5022 Chronic systolic (congestive) heart failure: Secondary | ICD-10-CM

## 2022-03-18 DIAGNOSIS — Z95810 Presence of automatic (implantable) cardiac defibrillator: Secondary | ICD-10-CM

## 2022-03-20 NOTE — Progress Notes (Signed)
EPIC Encounter for ICM Monitoring  Patient Name: Stacy Grimes is a 72 y.o. female Date: 03/20/2022 Primary Care Physican: Stacy Bender, PA-C Primary Cardiologist: Stacy Grimes Electrophysiologist: Stacy Grimes Pacing:  98%  10/12/2020 Office Weight: 184 lbs 03/05/2022 Weight:  Unable to weigh at home   AT/AF Burden <1% (taking Aspirin & Plavix)   Spoke with husband, Stacy Grimes per DPR.  Pt is feeling better and no fluid symptoms.    Coruve thoracic impedance suggesting fluid levels returned to normal.     Prescribed:  Furosemide 20 mg Take 1 tablet (20 mg total) by mouth every other day.  May take an additional tablet AS NEEDED for weight gain of 3 lbs overnight or 5 lbs in one week. Do not exceed more than 3 additional doses.   Potassium 20 mEq take 1 tablet daily    Labs: 03/10/2022 Creatinine 0.80, BUN 8,   Potassium 3.0, Sodium 141, GFR >60  03/09/2022 Creatinine 0.90, BUN 12, Potassium 4.0, Sodium 138, GFR >60  03/08/2022 Creatinine 0.94, BUN 15, Potassium 3.9, Sodium 141, GFR >60  03/07/2022 Creatinine 0.95, BUN 18, Potassium 3.2, Sodium 139, GFR >60 03/06/2022 Creatinine 1.13, BUN 15, Potassium 3.8, Sodium 139, GFR 52  01/16/2022 Creatinine 1.17, BUN 20, Potassium 3.9, Sodium 142, GFR 50 01/15/2022 Creatinine 1.17, BUN 14, Potassium 3.1, Sodium 143, GFR 50  01/14/2022 Creatinine 1.02, BUN 6,   Potassium 2.8, Sodium 143, GFR 58  A complete set of results can be found in Results Review.   Recommendations:   No changes and encouraged to call if experiencing any fluid symptoms.   Follow-up plan: ICM clinic phone appointment on 04/08/2022.  91 day device clinic remote transmission scheduled 04/04/2022.     EP/Cardiology Office Visits:   Canceled 03/08/2022 with Dr Stacy Grimes.     Last OV with Dr Stacy Grimes was 07/16/2017.    Copy of ICM check sent to Dr. Caryl Grimes.     3 month ICM trend: 03/18/2022.    12-14 Month ICM trend:     Stacy Billings, RN 03/20/2022 2:05 PM

## 2022-04-04 ENCOUNTER — Ambulatory Visit: Payer: Medicare HMO

## 2022-04-04 DIAGNOSIS — I428 Other cardiomyopathies: Secondary | ICD-10-CM | POA: Diagnosis not present

## 2022-04-04 LAB — CUP PACEART REMOTE DEVICE CHECK
Battery Remaining Longevity: 23 mo
Battery Remaining Percentage: 28 %
Battery Voltage: 2.86 V
Brady Statistic AP VP Percent: 1 %
Brady Statistic AP VS Percent: 1 %
Brady Statistic AS VP Percent: 98 %
Brady Statistic AS VS Percent: 1.6 %
Brady Statistic RA Percent Paced: 1 %
Date Time Interrogation Session: 20240229020018
HighPow Impedance: 56 Ohm
HighPow Impedance: 56 Ohm
Implantable Lead Connection Status: 753985
Implantable Lead Connection Status: 753985
Implantable Lead Connection Status: 753985
Implantable Lead Implant Date: 20110309
Implantable Lead Implant Date: 20110309
Implantable Lead Implant Date: 20110309
Implantable Lead Location: 753858
Implantable Lead Location: 753859
Implantable Lead Location: 753860
Implantable Lead Model: 7121
Implantable Pulse Generator Implant Date: 20180814
Lead Channel Impedance Value: 350 Ohm
Lead Channel Impedance Value: 560 Ohm
Lead Channel Impedance Value: 610 Ohm
Lead Channel Pacing Threshold Amplitude: 0.75 V
Lead Channel Pacing Threshold Amplitude: 0.75 V
Lead Channel Pacing Threshold Amplitude: 0.75 V
Lead Channel Pacing Threshold Pulse Width: 0.5 ms
Lead Channel Pacing Threshold Pulse Width: 0.5 ms
Lead Channel Pacing Threshold Pulse Width: 0.5 ms
Lead Channel Sensing Intrinsic Amplitude: 11.7 mV
Lead Channel Sensing Intrinsic Amplitude: 4.5 mV
Lead Channel Setting Pacing Amplitude: 2 V
Lead Channel Setting Pacing Amplitude: 2 V
Lead Channel Setting Pacing Amplitude: 2.5 V
Lead Channel Setting Pacing Pulse Width: 0.5 ms
Lead Channel Setting Pacing Pulse Width: 0.5 ms
Lead Channel Setting Sensing Sensitivity: 0.5 mV
Pulse Gen Serial Number: 9761609

## 2022-04-08 ENCOUNTER — Ambulatory Visit: Payer: Medicare HMO | Attending: Internal Medicine

## 2022-04-08 DIAGNOSIS — I5022 Chronic systolic (congestive) heart failure: Secondary | ICD-10-CM | POA: Diagnosis not present

## 2022-04-08 DIAGNOSIS — Z95810 Presence of automatic (implantable) cardiac defibrillator: Secondary | ICD-10-CM | POA: Diagnosis not present

## 2022-04-08 NOTE — Progress Notes (Unsigned)
EPIC Encounter for ICM Monitoring  Patient Name: Stacy Grimes is a 72 y.o. female Date: 04/08/2022 Primary Care Physican: Cyndi Bender, PA-C Primary Cardiologist: Fletcher Anon Electrophysiologist: Vergie Living Pacing:  98%  10/12/2020 Office Weight: 184 lbs 03/05/2022 Weight:  Unable to weigh at home   AT/AF Burden <1% (taking Aspirin & Plavix)   Spoke with husband per DPR and heart failure questions reviewed.  Transmission results reviewed.  Husband reports that she is been sleeping a lot but unable to determine if she has any fluid symptoms.   Pt reports stopping one of her behavior meds that was prescribed by Dr Melrose Nakayama.   Advised to call Dr Weber Cooks office today and discuss the medication because some behavior meds require weaning and not stopping abruptly.     Coruve thoracic impedance suggesting possible fluid accumulation starting 3/1.   Prescribed:  Furosemide 20 mg Take 1 tablet (20 mg total) by mouth every other day.  May take an additional tablet AS NEEDED for weight gain of 3 lbs overnight or 5 lbs in one week. Do not exceed more than 3 additional doses.   Potassium 20 mEq take 1 tablet daily    Labs: 03/10/2022 Creatinine 0.80, BUN 8,   Potassium 3.0, Sodium 141, GFR >60  03/09/2022 Creatinine 0.90, BUN 12, Potassium 4.0, Sodium 138, GFR >60  03/08/2022 Creatinine 0.94, BUN 15, Potassium 3.9, Sodium 141, GFR >60  03/07/2022 Creatinine 0.95, BUN 18, Potassium 3.2, Sodium 139, GFR >60 03/06/2022 Creatinine 1.13, BUN 15, Potassium 3.8, Sodium 139, GFR 52  01/16/2022 Creatinine 1.17, BUN 20, Potassium 3.9, Sodium 142, GFR 50 01/15/2022 Creatinine 1.17, BUN 14, Potassium 3.1, Sodium 143, GFR 50  01/14/2022 Creatinine 1.02, BUN 6,   Potassium 2.8, Sodium 143, GFR 58  A complete set of results can be found in Results Review.   Recommendations:   Did not advise to have patient take extra Furosemide since 2/4 labs showed low potassium.  Advised will send to Dr Fletcher Anon for review and  recommendations.  Confirmed she is taking Lasix 20 mg every other day and Potassium 20 mEq daily.  He reports patient has home health helping at this time.     Follow-up plan: ICM clinic phone appointment on 04/15/2022 to recheck fluid levels.  91 day device clinic remote transmission scheduled 04/04/2022.     EP/Cardiology Office Visits:   Canceled 03/08/2022 with Dr Fletcher Anon.     Last OV with Dr Caryl Comes was 07/16/2017.    Copy of ICM check sent to Dr. Caryl Comes and Dr Fletcher Anon for review and recommendations I needed.       3 month ICM trend: 04/08/2022.    12-14 Month ICM trend:     [name], RN 04/08/2022 10:04 AM

## 2022-04-10 DIAGNOSIS — R413 Other amnesia: Secondary | ICD-10-CM | POA: Diagnosis not present

## 2022-04-11 ENCOUNTER — Encounter: Payer: Self-pay | Admitting: Emergency Medicine

## 2022-04-11 ENCOUNTER — Other Ambulatory Visit: Payer: Self-pay

## 2022-04-11 ENCOUNTER — Emergency Department: Payer: Medicare HMO

## 2022-04-11 ENCOUNTER — Inpatient Hospital Stay
Admission: EM | Admit: 2022-04-11 | Discharge: 2022-04-25 | DRG: 689 | Disposition: A | Discharge status: Home Health | Payer: Medicare HMO | Attending: Student | Admitting: Student

## 2022-04-11 DIAGNOSIS — E878 Other disorders of electrolyte and fluid balance, not elsewhere classified: Secondary | ICD-10-CM | POA: Diagnosis present

## 2022-04-11 DIAGNOSIS — I959 Hypotension, unspecified: Secondary | ICD-10-CM | POA: Diagnosis not present

## 2022-04-11 DIAGNOSIS — E870 Hyperosmolality and hypernatremia: Secondary | ICD-10-CM | POA: Diagnosis present

## 2022-04-11 DIAGNOSIS — G40009 Localization-related (focal) (partial) idiopathic epilepsy and epileptic syndromes with seizures of localized onset, not intractable, without status epilepticus: Secondary | ICD-10-CM | POA: Diagnosis not present

## 2022-04-11 DIAGNOSIS — R109 Unspecified abdominal pain: Secondary | ICD-10-CM | POA: Diagnosis not present

## 2022-04-11 DIAGNOSIS — N179 Acute kidney failure, unspecified: Secondary | ICD-10-CM | POA: Diagnosis present

## 2022-04-11 DIAGNOSIS — E871 Hypo-osmolality and hyponatremia: Secondary | ICD-10-CM | POA: Diagnosis not present

## 2022-04-11 DIAGNOSIS — R569 Unspecified convulsions: Secondary | ICD-10-CM | POA: Diagnosis not present

## 2022-04-11 DIAGNOSIS — Z6823 Body mass index (BMI) 23.0-23.9, adult: Secondary | ICD-10-CM

## 2022-04-11 DIAGNOSIS — E039 Hypothyroidism, unspecified: Secondary | ICD-10-CM | POA: Diagnosis present

## 2022-04-11 DIAGNOSIS — T50915A Adverse effect of multiple unspecified drugs, medicaments and biological substances, initial encounter: Secondary | ICD-10-CM | POA: Diagnosis present

## 2022-04-11 DIAGNOSIS — I69398 Other sequelae of cerebral infarction: Secondary | ICD-10-CM

## 2022-04-11 DIAGNOSIS — Z8249 Family history of ischemic heart disease and other diseases of the circulatory system: Secondary | ICD-10-CM

## 2022-04-11 DIAGNOSIS — I5022 Chronic systolic (congestive) heart failure: Secondary | ICD-10-CM | POA: Diagnosis present

## 2022-04-11 DIAGNOSIS — E860 Dehydration: Secondary | ICD-10-CM | POA: Diagnosis present

## 2022-04-11 DIAGNOSIS — J9611 Chronic respiratory failure with hypoxia: Secondary | ICD-10-CM | POA: Diagnosis present

## 2022-04-11 DIAGNOSIS — Z885 Allergy status to narcotic agent status: Secondary | ICD-10-CM

## 2022-04-11 DIAGNOSIS — N390 Urinary tract infection, site not specified: Principal | ICD-10-CM | POA: Diagnosis present

## 2022-04-11 DIAGNOSIS — J449 Chronic obstructive pulmonary disease, unspecified: Secondary | ICD-10-CM | POA: Diagnosis present

## 2022-04-11 DIAGNOSIS — Z95810 Presence of automatic (implantable) cardiac defibrillator: Secondary | ICD-10-CM

## 2022-04-11 DIAGNOSIS — K219 Gastro-esophageal reflux disease without esophagitis: Secondary | ICD-10-CM | POA: Diagnosis present

## 2022-04-11 DIAGNOSIS — R531 Weakness: Secondary | ICD-10-CM | POA: Diagnosis not present

## 2022-04-11 DIAGNOSIS — H9192 Unspecified hearing loss, left ear: Secondary | ICD-10-CM | POA: Diagnosis present

## 2022-04-11 DIAGNOSIS — Z79899 Other long term (current) drug therapy: Secondary | ICD-10-CM

## 2022-04-11 DIAGNOSIS — I2782 Chronic pulmonary embolism: Secondary | ICD-10-CM | POA: Diagnosis present

## 2022-04-11 DIAGNOSIS — I472 Ventricular tachycardia, unspecified: Secondary | ICD-10-CM | POA: Diagnosis not present

## 2022-04-11 DIAGNOSIS — I110 Hypertensive heart disease with heart failure: Secondary | ICD-10-CM | POA: Diagnosis present

## 2022-04-11 DIAGNOSIS — R410 Disorientation, unspecified: Secondary | ICD-10-CM | POA: Diagnosis not present

## 2022-04-11 DIAGNOSIS — G40901 Epilepsy, unspecified, not intractable, with status epilepticus: Secondary | ICD-10-CM | POA: Diagnosis present

## 2022-04-11 DIAGNOSIS — E440 Moderate protein-calorie malnutrition: Secondary | ICD-10-CM | POA: Diagnosis present

## 2022-04-11 DIAGNOSIS — G40101 Localization-related (focal) (partial) symptomatic epilepsy and epileptic syndromes with simple partial seizures, not intractable, with status epilepticus: Secondary | ICD-10-CM | POA: Diagnosis present

## 2022-04-11 DIAGNOSIS — F419 Anxiety disorder, unspecified: Secondary | ICD-10-CM | POA: Diagnosis present

## 2022-04-11 DIAGNOSIS — Z7902 Long term (current) use of antithrombotics/antiplatelets: Secondary | ICD-10-CM

## 2022-04-11 DIAGNOSIS — Z90710 Acquired absence of both cervix and uterus: Secondary | ICD-10-CM

## 2022-04-11 DIAGNOSIS — D126 Benign neoplasm of colon, unspecified: Secondary | ICD-10-CM | POA: Diagnosis present

## 2022-04-11 DIAGNOSIS — R140 Abdominal distension (gaseous): Secondary | ICD-10-CM | POA: Diagnosis not present

## 2022-04-11 DIAGNOSIS — F32A Depression, unspecified: Secondary | ICD-10-CM | POA: Diagnosis present

## 2022-04-11 DIAGNOSIS — G8929 Other chronic pain: Secondary | ICD-10-CM | POA: Diagnosis not present

## 2022-04-11 DIAGNOSIS — D649 Anemia, unspecified: Secondary | ICD-10-CM | POA: Diagnosis not present

## 2022-04-11 DIAGNOSIS — D696 Thrombocytopenia, unspecified: Secondary | ICD-10-CM | POA: Diagnosis present

## 2022-04-11 DIAGNOSIS — F01518 Vascular dementia, unspecified severity, with other behavioral disturbance: Secondary | ICD-10-CM | POA: Diagnosis present

## 2022-04-11 DIAGNOSIS — T426X5A Adverse effect of other antiepileptic and sedative-hypnotic drugs, initial encounter: Secondary | ICD-10-CM | POA: Diagnosis present

## 2022-04-11 DIAGNOSIS — E46 Unspecified protein-calorie malnutrition: Secondary | ICD-10-CM | POA: Diagnosis present

## 2022-04-11 DIAGNOSIS — E43 Unspecified severe protein-calorie malnutrition: Secondary | ICD-10-CM | POA: Diagnosis present

## 2022-04-11 DIAGNOSIS — E875 Hyperkalemia: Secondary | ICD-10-CM | POA: Diagnosis not present

## 2022-04-11 DIAGNOSIS — Z91041 Radiographic dye allergy status: Secondary | ICD-10-CM

## 2022-04-11 DIAGNOSIS — E876 Hypokalemia: Secondary | ICD-10-CM | POA: Diagnosis not present

## 2022-04-11 DIAGNOSIS — R4182 Altered mental status, unspecified: Secondary | ICD-10-CM | POA: Diagnosis present

## 2022-04-11 DIAGNOSIS — I10 Essential (primary) hypertension: Secondary | ICD-10-CM | POA: Diagnosis present

## 2022-04-11 DIAGNOSIS — I9589 Other hypotension: Secondary | ICD-10-CM | POA: Diagnosis not present

## 2022-04-11 DIAGNOSIS — G928 Other toxic encephalopathy: Secondary | ICD-10-CM | POA: Diagnosis present

## 2022-04-11 DIAGNOSIS — Z9981 Dependence on supplemental oxygen: Secondary | ICD-10-CM

## 2022-04-11 DIAGNOSIS — F322 Major depressive disorder, single episode, severe without psychotic features: Secondary | ICD-10-CM | POA: Diagnosis present

## 2022-04-11 DIAGNOSIS — Z7951 Long term (current) use of inhaled steroids: Secondary | ICD-10-CM

## 2022-04-11 DIAGNOSIS — Z136 Encounter for screening for cardiovascular disorders: Secondary | ICD-10-CM | POA: Diagnosis not present

## 2022-04-11 DIAGNOSIS — Z515 Encounter for palliative care: Secondary | ICD-10-CM | POA: Diagnosis not present

## 2022-04-11 DIAGNOSIS — Z7401 Bed confinement status: Secondary | ICD-10-CM | POA: Diagnosis not present

## 2022-04-11 DIAGNOSIS — E872 Acidosis, unspecified: Secondary | ICD-10-CM | POA: Diagnosis present

## 2022-04-11 DIAGNOSIS — F0390 Unspecified dementia without behavioral disturbance: Secondary | ICD-10-CM | POA: Diagnosis not present

## 2022-04-11 DIAGNOSIS — F03918 Unspecified dementia, unspecified severity, with other behavioral disturbance: Secondary | ICD-10-CM | POA: Diagnosis present

## 2022-04-11 DIAGNOSIS — Z800 Family history of malignant neoplasm of digestive organs: Secondary | ICD-10-CM

## 2022-04-11 DIAGNOSIS — Z79890 Hormone replacement therapy (postmenopausal): Secondary | ICD-10-CM

## 2022-04-11 DIAGNOSIS — D6959 Other secondary thrombocytopenia: Secondary | ICD-10-CM | POA: Diagnosis present

## 2022-04-11 DIAGNOSIS — N281 Cyst of kidney, acquired: Secondary | ICD-10-CM | POA: Diagnosis not present

## 2022-04-11 DIAGNOSIS — G40919 Epilepsy, unspecified, intractable, without status epilepticus: Secondary | ICD-10-CM | POA: Diagnosis not present

## 2022-04-11 DIAGNOSIS — Z823 Family history of stroke: Secondary | ICD-10-CM

## 2022-04-11 DIAGNOSIS — E162 Hypoglycemia, unspecified: Secondary | ICD-10-CM | POA: Diagnosis not present

## 2022-04-11 DIAGNOSIS — F0153 Vascular dementia, unspecified severity, with mood disturbance: Secondary | ICD-10-CM | POA: Diagnosis present

## 2022-04-11 DIAGNOSIS — I420 Dilated cardiomyopathy: Secondary | ICD-10-CM | POA: Diagnosis present

## 2022-04-11 DIAGNOSIS — E538 Deficiency of other specified B group vitamins: Secondary | ICD-10-CM | POA: Diagnosis present

## 2022-04-11 DIAGNOSIS — N3289 Other specified disorders of bladder: Secondary | ICD-10-CM | POA: Diagnosis not present

## 2022-04-11 DIAGNOSIS — Z803 Family history of malignant neoplasm of breast: Secondary | ICD-10-CM

## 2022-04-11 DIAGNOSIS — Z87891 Personal history of nicotine dependence: Secondary | ICD-10-CM

## 2022-04-11 DIAGNOSIS — Z9049 Acquired absence of other specified parts of digestive tract: Secondary | ICD-10-CM

## 2022-04-11 DIAGNOSIS — Z1152 Encounter for screening for COVID-19: Secondary | ICD-10-CM

## 2022-04-11 DIAGNOSIS — F05 Delirium due to known physiological condition: Secondary | ICD-10-CM | POA: Diagnosis not present

## 2022-04-11 DIAGNOSIS — T460X5A Adverse effect of cardiac-stimulant glycosides and drugs of similar action, initial encounter: Secondary | ICD-10-CM | POA: Diagnosis present

## 2022-04-11 LAB — COMPREHENSIVE METABOLIC PANEL
ALT: 19 U/L (ref 0–44)
AST: 45 U/L — ABNORMAL HIGH (ref 15–41)
Albumin: 2.7 g/dL — ABNORMAL LOW (ref 3.5–5.0)
Alkaline Phosphatase: 151 U/L — ABNORMAL HIGH (ref 38–126)
Anion gap: 11 (ref 5–15)
BUN: 62 mg/dL — ABNORMAL HIGH (ref 8–23)
CO2: 21 mmol/L — ABNORMAL LOW (ref 22–32)
Calcium: 9.3 mg/dL (ref 8.9–10.3)
Chloride: 116 mmol/L — ABNORMAL HIGH (ref 98–111)
Creatinine, Ser: 3.72 mg/dL — ABNORMAL HIGH (ref 0.44–1.00)
GFR, Estimated: 12 mL/min — ABNORMAL LOW (ref >60)
Glucose, Bld: 114 mg/dL — ABNORMAL HIGH (ref 70–99)
Potassium: 6.3 mmol/L (ref 3.5–5.1)
Sodium: 148 mmol/L — ABNORMAL HIGH (ref 135–145)
Total Bilirubin: 0.3 mg/dL (ref 0.3–1.2)
Total Protein: 7.7 g/dL (ref 6.5–8.1)

## 2022-04-11 LAB — RESP PANEL BY RT-PCR (RSV, FLU A&B, COVID)  RVPGX2
Influenza A by PCR: NEGATIVE
Influenza B by PCR: NEGATIVE
Resp Syncytial Virus by PCR: NEGATIVE
SARS Coronavirus 2 by RT PCR: NEGATIVE

## 2022-04-11 LAB — URINALYSIS, ROUTINE W REFLEX MICROSCOPIC
Bilirubin Urine: NEGATIVE
Glucose, UA: NEGATIVE mg/dL
Ketones, ur: NEGATIVE mg/dL
Nitrite: NEGATIVE
Protein, ur: 30 mg/dL — AB
Specific Gravity, Urine: 1.018 (ref 1.005–1.030)
Squamous Epithelial / HPF: NONE SEEN /HPF (ref 0–5)
WBC, UA: >50 WBC/hpf (ref 0–5)
pH: 5 (ref 5.0–8.0)

## 2022-04-11 LAB — AMMONIA: Ammonia: 25 umol/L (ref 9–35)

## 2022-04-11 LAB — CBC WITH DIFFERENTIAL/PLATELET
Abs Immature Granulocytes: 0.04 10*3/uL (ref 0.00–0.07)
Basophils Absolute: 0.1 10*3/uL (ref 0.0–0.1)
Basophils Relative: 1 %
Eosinophils Absolute: 0.1 10*3/uL (ref 0.0–0.5)
Eosinophils Relative: 2 %
HCT: 34.5 % — ABNORMAL LOW (ref 36.0–46.0)
Hemoglobin: 10.1 g/dL — ABNORMAL LOW (ref 12.0–15.0)
Immature Granulocytes: 0 %
Lymphocytes Relative: 18 %
Lymphs Abs: 1.7 10*3/uL (ref 0.7–4.0)
MCH: 24.1 pg — ABNORMAL LOW (ref 26.0–34.0)
MCHC: 29.3 g/dL — ABNORMAL LOW (ref 30.0–36.0)
MCV: 82.3 fL (ref 80.0–100.0)
Monocytes Absolute: 0.9 10*3/uL (ref 0.1–1.0)
Monocytes Relative: 10 %
Neutro Abs: 6.5 10*3/uL (ref 1.7–7.7)
Neutrophils Relative %: 69 %
Platelets: 141 10*3/uL — ABNORMAL LOW (ref 150–400)
RBC: 4.19 MIL/uL (ref 3.87–5.11)
RDW: 20.4 % — ABNORMAL HIGH (ref 11.5–15.5)
WBC: 9.3 10*3/uL (ref 4.0–10.5)
nRBC: 0.3 % — ABNORMAL HIGH (ref 0.0–0.2)

## 2022-04-11 LAB — PROTIME-INR
INR: 1.1 (ref 0.8–1.2)
Prothrombin Time: 14.1 seconds (ref 11.4–15.2)

## 2022-04-11 LAB — LACTIC ACID, PLASMA
Lactic Acid, Venous: 3.5 mmol/L (ref 0.5–1.9)
Lactic Acid, Venous: 1.8 mmol/L (ref 0.5–1.9)

## 2022-04-11 LAB — BLOOD GAS, VENOUS
Acid-base deficit: 3.3 mmol/L — ABNORMAL HIGH (ref 0.0–2.0)
Bicarbonate: 24.3 mmol/L (ref 20.0–28.0)
O2 Saturation: 54.2 %
Patient temperature: 37
pCO2, Ven: 53 mmHg (ref 44–60)
pH, Ven: 7.27 (ref 7.25–7.43)
pO2, Ven: 39 mmHg (ref 32–45)

## 2022-04-11 LAB — MAGNESIUM: Magnesium: 2 mg/dL (ref 1.7–2.4)

## 2022-04-11 LAB — PROCALCITONIN: Procalcitonin: 0.21 ng/mL

## 2022-04-11 LAB — DIGOXIN LEVEL: Digoxin Level: 3.2 ng/mL (ref 0.8–2.0)

## 2022-04-11 LAB — POTASSIUM: Potassium: 5.4 mmol/L — ABNORMAL HIGH (ref 3.5–5.1)

## 2022-04-11 MED ORDER — ONDANSETRON HCL 4 MG/2ML IJ SOLN: 4.0000 mg | Freq: Four times a day (QID) | INTRAMUSCULAR | Status: DC | PRN

## 2022-04-11 MED ORDER — ACETAMINOPHEN 325 MG PO TABS
650.0000 mg | ORAL_TABLET | Freq: Four times a day (QID) | ORAL | Status: AC | PRN
  Administered 2022-04-13 – 2022-04-16 (×3): 650 mg via ORAL
  Filled 2022-04-11 (×3): qty 2

## 2022-04-11 MED ORDER — SODIUM CHLORIDE 0.9 % IV BOLUS
500.0000 mL | Freq: Once | INTRAVENOUS | Status: AC
  Administered 2022-04-11: 500 mL via INTRAVENOUS

## 2022-04-11 MED ORDER — DEXTROSE 5 % IV SOLN: INTRAVENOUS | Status: AC

## 2022-04-11 MED ORDER — DONEPEZIL HCL 5 MG PO TABS
10.0000 mg | ORAL_TABLET | Freq: Every day | ORAL | Status: DC
  Administered 2022-04-11 – 2022-04-24 (×14): 10 mg via ORAL
  Filled 2022-04-11 (×14): qty 2

## 2022-04-11 MED ORDER — INSULIN ASPART 100 UNIT/ML IJ SOLN
6.0000 [IU] | Freq: Once | INTRAMUSCULAR | Status: AC
  Administered 2022-04-11: 6 [IU] via INTRAVENOUS
  Filled 2022-04-11: qty 1

## 2022-04-11 MED ORDER — CALCIUM GLUCONATE 10 % IV SOLN
1.0000 g | Freq: Once | INTRAVENOUS | Status: AC
  Administered 2022-04-11: 1 g via INTRAVENOUS
  Filled 2022-04-11: qty 10

## 2022-04-11 MED ORDER — DIGOXIN 125 MCG PO TABS: 0.1250 mg | ORAL_TABLET | Freq: Every day | ORAL | Status: DC

## 2022-04-11 MED ORDER — MOMETASONE FURO-FORMOTEROL FUM 200-5 MCG/ACT IN AERO
2.0000 | INHALATION_SPRAY | Freq: Two times a day (BID) | RESPIRATORY_TRACT | Status: DC
  Administered 2022-04-12 – 2022-04-25 (×25): 2 via RESPIRATORY_TRACT
  Filled 2022-04-11: qty 8.8

## 2022-04-11 MED ORDER — HEPARIN SODIUM (PORCINE) 5000 UNIT/ML IJ SOLN
5000.0000 [IU] | Freq: Three times a day (TID) | INTRAMUSCULAR | Status: DC
  Administered 2022-04-11 – 2022-04-13 (×5): 5000 [IU] via SUBCUTANEOUS
  Filled 2022-04-11 (×5): qty 1

## 2022-04-11 MED ORDER — SODIUM CHLORIDE 0.9 % IV SOLN
2.0000 g | INTRAVENOUS | Status: DC
  Administered 2022-04-11 – 2022-04-16 (×6): 2 g via INTRAVENOUS
  Filled 2022-04-11 (×2): qty 20
  Filled 2022-04-11: qty 2
  Filled 2022-04-11 (×2): qty 20
  Filled 2022-04-11: qty 2

## 2022-04-11 MED ORDER — ALLOPURINOL 100 MG PO TABS
50.0000 mg | ORAL_TABLET | Freq: Every day | ORAL | Status: DC
  Administered 2022-04-12 – 2022-04-25 (×14): 50 mg via ORAL
  Filled 2022-04-11 (×14): qty 1

## 2022-04-11 MED ORDER — ACETAMINOPHEN 650 MG RE SUPP: 650.0000 mg | Freq: Four times a day (QID) | RECTAL | Status: AC | PRN

## 2022-04-11 MED ORDER — VITAMIN B-12 1000 MCG PO TABS
1000.0000 ug | ORAL_TABLET | Freq: Two times a day (BID) | ORAL | Status: DC
  Administered 2022-04-11: 1000 ug via ORAL
  Filled 2022-04-11: qty 1

## 2022-04-11 MED ORDER — GABAPENTIN 300 MG PO CAPS: 300.0000 mg | ORAL_CAPSULE | Freq: Two times a day (BID) | ORAL | Status: DC | PRN

## 2022-04-11 MED ORDER — FAMOTIDINE 20 MG PO TABS
20.0000 mg | ORAL_TABLET | Freq: Every day | ORAL | Status: DC
  Administered 2022-04-12 – 2022-04-25 (×14): 20 mg via ORAL
  Filled 2022-04-11 (×14): qty 1

## 2022-04-11 MED ORDER — LEVOTHYROXINE SODIUM 100 MCG PO TABS
100.0000 ug | ORAL_TABLET | Freq: Every day | ORAL | Status: DC
  Administered 2022-04-12 – 2022-04-25 (×13): 100 ug via ORAL
  Filled 2022-04-11 (×14): qty 1

## 2022-04-11 MED ORDER — SODIUM CHLORIDE 0.9 % IV SOLN
2.0000 | Freq: Once | INTRAVENOUS | Status: AC
  Administered 2022-04-11: 2 via INTRAVENOUS
  Filled 2022-04-11: qty 80

## 2022-04-11 MED ORDER — SODIUM CHLORIDE 0.9 % IV BOLUS
1000.0000 mL | Freq: Once | INTRAVENOUS | Status: AC
  Administered 2022-04-11: 1000 mL via INTRAVENOUS

## 2022-04-11 MED ORDER — CLOPIDOGREL BISULFATE 75 MG PO TABS
75.0000 mg | ORAL_TABLET | Freq: Every day | ORAL | Status: DC
  Administered 2022-04-12 – 2022-04-13 (×2): 75 mg via ORAL
  Filled 2022-04-11 (×2): qty 1

## 2022-04-11 MED ORDER — LORAZEPAM 2 MG/ML IJ SOLN: 2.0000 mg | INTRAMUSCULAR | Status: DC | PRN

## 2022-04-11 MED ORDER — POLYVINYL ALCOHOL 1.4 % OP SOLN: 1.0000 [drp] | OPHTHALMIC | Status: DC | PRN

## 2022-04-11 MED ORDER — DEXTROSE 50 % IV SOLN
1.0000 | Freq: Once | INTRAVENOUS | Status: AC
  Administered 2022-04-11: 50 mL via INTRAVENOUS
  Filled 2022-04-11: qty 50

## 2022-04-11 MED ORDER — HYDRALAZINE HCL 20 MG/ML IJ SOLN: 5.0000 mg | Freq: Three times a day (TID) | INTRAMUSCULAR | Status: DC | PRN

## 2022-04-11 MED ORDER — MIRTAZAPINE 15 MG PO TABS
7.5000 mg | ORAL_TABLET | Freq: Two times a day (BID) | ORAL | Status: DC
  Administered 2022-04-11: 7.5 mg via ORAL
  Filled 2022-04-11 (×2): qty 1

## 2022-04-11 MED ORDER — ATORVASTATIN CALCIUM 20 MG PO TABS
80.0000 mg | ORAL_TABLET | Freq: Every day | ORAL | Status: DC
  Administered 2022-04-12 – 2022-04-24 (×13): 80 mg via ORAL
  Filled 2022-04-11: qty 1
  Filled 2022-04-11: qty 4
  Filled 2022-04-11 (×9): qty 1
  Filled 2022-04-11 (×2): qty 4

## 2022-04-11 MED ORDER — SENNOSIDES-DOCUSATE SODIUM 8.6-50 MG PO TABS: 1.0000 | ORAL_TABLET | Freq: Every evening | ORAL | Status: DC | PRN

## 2022-04-11 MED ORDER — METOPROLOL SUCCINATE ER 25 MG PO TB24
25.0000 mg | ORAL_TABLET | Freq: Every day | ORAL | Status: DC
  Administered 2022-04-12: 25 mg via ORAL
  Filled 2022-04-11: qty 1

## 2022-04-11 MED ORDER — ONDANSETRON HCL 4 MG PO TABS: 4.0000 mg | ORAL_TABLET | Freq: Four times a day (QID) | ORAL | Status: DC | PRN

## 2022-04-11 MED ORDER — HYDROXYZINE HCL 25 MG PO TABS
25.0000 mg | ORAL_TABLET | Freq: Every evening | ORAL | Status: DC | PRN
  Administered 2022-04-17 – 2022-04-23 (×2): 25 mg via ORAL
  Filled 2022-04-11: qty 1
  Filled 2022-04-11 (×4): qty 3

## 2022-04-11 MED ORDER — SODIUM BICARBONATE 8.4 % IV SOLN
50.0000 meq | Freq: Once | INTRAVENOUS | Status: AC
  Administered 2022-04-11: 50 meq via INTRAVENOUS
  Filled 2022-04-11: qty 50

## 2022-04-11 MED ORDER — MEMANTINE HCL 10 MG PO TABS
10.0000 mg | ORAL_TABLET | Freq: Two times a day (BID) | ORAL | Status: DC
  Administered 2022-04-11 – 2022-04-25 (×28): 10 mg via ORAL
  Filled 2022-04-11 (×4): qty 1
  Filled 2022-04-11: qty 2
  Filled 2022-04-11: qty 1
  Filled 2022-04-11: qty 2
  Filled 2022-04-11 (×12): qty 1
  Filled 2022-04-11: qty 2
  Filled 2022-04-11 (×4): qty 1
  Filled 2022-04-11: qty 2
  Filled 2022-04-11 (×2): qty 1
  Filled 2022-04-11 (×2): qty 2
  Filled 2022-04-11 (×2): qty 1
  Filled 2022-04-11 (×2): qty 2
  Filled 2022-04-11: qty 1

## 2022-04-11 MED ORDER — DIVALPROEX SODIUM 125 MG PO CSDR
250.0000 mg | DELAYED_RELEASE_CAPSULE | Freq: Two times a day (BID) | ORAL | Status: DC
  Administered 2022-04-11 – 2022-04-13 (×5): 250 mg via ORAL
  Filled 2022-04-11 (×6): qty 2

## 2022-04-11 MED ORDER — ALBUTEROL SULFATE (2.5 MG/3ML) 0.083% IN NEBU: 3.0000 mL | INHALATION_SOLUTION | Freq: Four times a day (QID) | RESPIRATORY_TRACT | Status: DC | PRN

## 2022-04-11 NOTE — Assessment & Plan Note (Signed)
Donepezil 10 mg nightly, memantine 10 mg p.o. twice daily were resumed on admission

## 2022-04-11 NOTE — Assessment & Plan Note (Signed)
Levothyroxine 100 mcg daily before breakfast resumed

## 2022-04-11 NOTE — ED Triage Notes (Signed)
Pt to ED via [facility] from home for Altered Mental Status. Per EMS family called EMS to evaluated patient for same on 3/4 but family refused transport at that time stating that they would call back if she did not get better. EMS reports patient has been combative and agitated with them. Pt has hx/o stroke and wears 4 liter of oxygen at baseline. EMS reports unable to perform stroke screen on patient.   Pt alert to self only at this time.

## 2022-04-11 NOTE — Assessment & Plan Note (Signed)
Does not appear to be in acute exacerbation at this time Resumed home maintenance Symbicort alternative (Dulera) 2 puff inhalation twice daily, albuterol nebulizer every 6 hours as needed for wheezing and shortness of breath

## 2022-04-11 NOTE — Assessment & Plan Note (Signed)
Consult placed to registered dietitian

## 2022-04-11 NOTE — Assessment & Plan Note (Signed)
Metoprolol succinate 25 mg nightly resumed Home furosemide 20 mg every other day and losartan 12.5 mg nightly not resumed on admission due to acute kidney injury Hydralazine 5 mg IV every 8 hours as needed for SBP greater than 175, 4 days ordered

## 2022-04-11 NOTE — Progress Notes (Signed)
Critical Digoxin level of 3.2 reported to B. Randol Kern, on-call for attending '@[id number]'$ .

## 2022-04-11 NOTE — ED Notes (Signed)
Provider Quentin Cornwall notified about Lactic acid of 3.5 and Potassium 6.3.

## 2022-04-11 NOTE — Progress Notes (Signed)
No recommendations received.

## 2022-04-11 NOTE — Hospital Course (Signed)
[name] is a 72 year old female with history of stroke, dementia, COPD on chronic 4 L nasal cannula, cardiomyopathy, who presents to the emergency department for chief concerns of altered mental status for the 3 days.  Vitals in the ED showed temperature of 97.7, respiration rate of 22, heart rate 78, blood pressure 134/100, SpO2 of 96% on 4 L nasal cannula.  Serum sodium is 148, potassium 6.3, chloride 116, bicarb 21, BUN of 62, serum creatinine of 3.72, EGFR of 12, nonfasting blood glucose 114, WBC 9.3, hemoglobin 10.1, platelets of 141.  Lactic acid is 3.5.  Blood cultures x 2 ordered and are in process. VBG showed 7.2 7/53/39. Ammonia is 25.  ED treatment: Calcium gluconate 1 g IV one-time dose, dextrose 50% 1 amp, insulin 6 units one-time dose, sodium bicarbonate 50 mill equivalent, ceftriaxone 2 g IV, sodium chloride 1 L bolus, sodium chloride 500 mL bolus

## 2022-04-11 NOTE — Assessment & Plan Note (Addendum)
Presumed prerenal secondary to poor p.o. intake for several weeks Creatinine clearance calculated on admission was 6 mL/min Registered dietitian has been consulted Status post sodium chloride 1 L bolus and sodium chloride 500 mL bolus per EDP Dextrose 5% solution at 100 mL/h, 10 hours ordered

## 2022-04-11 NOTE — ED Notes (Signed)
Hospitalist at bedside 

## 2022-04-11 NOTE — Assessment & Plan Note (Signed)
Hydroxyzine 25 mg nightly as needed for anxiety, itching Mirtazapine 7.5 mg p.o. twice daily resumed

## 2022-04-11 NOTE — H&P (Signed)
History and Physical   Stacy Grimes [id number] DOB: 07-10-1950 DOA: 04/11/2022  PCP: Juluis Pitch, MD  Outpatient Specialists: Dr. Caryl Comes, cardiology; Dr. Melrose Nakayama, neurology Patient coming from: Home via EMS  I have personally briefly reviewed patient's old medical records in [place].  Chief Concern: Altered mental status, 3 days  HPI: Stacy Grimes is a 72 year old female with history of stroke, dementia, COPD on chronic 4 L nasal cannula, cardiomyopathy, who presents to the emergency department for chief concerns of altered mental status for the 3 days.  Vitals in the ED showed temperature of 97.7, respiration rate of 22, heart rate 78, blood pressure 134/100, SpO2 of 96% on 4 L nasal cannula.  Serum sodium is 148, potassium 6.3, chloride 116, bicarb 21, BUN of 62, serum creatinine of 3.72, EGFR of 12, nonfasting blood glucose 114, WBC 9.3, hemoglobin 10.1, platelets of 141.  Lactic acid is 3.5.  Blood cultures x 2 ordered and are in process. VBG showed 7.2 7/53/39. Ammonia is 25.  ED treatment: Calcium gluconate 1 g IV one-time dose, dextrose 50% 1 amp, insulin 6 units one-time dose, sodium bicarbonate 50 mill equivalent, ceftriaxone 2 g IV, sodium chloride 1 L bolus, sodium chloride 500 mL bolus ------------------------------------ At bedside, at bedside, she was able to tell me her first name and then she feel back to sleep. She was arousable with sternal rub and shaking of her legs. When she wakes, she flexed her head and looked around and was able tell me the current location of hospital.  Husband reports that she has not been eating well since January 08, 2022.   Per husband, since Sunday morning, patient was more altered, a lot of twitching, and difficulty speaking. He reports he tries to give patient water and food every 2-3 hours and patient just takes one or two sips and then stops. He denies report of vomiting, diarrhea, fever. He states her urine 'can knock  you out from the smell' over the last few days.  Social history: she lives at home with her husband. She is a former tobacco, user, quitting in 2001. She denies etoh and recreational.   ROS: Unable to complete as patient is altered and keeps falling back asleep.  ED Course: Discussed with emergency medicine provider, patient requiring hospitalization for chief concerns of hyperkalemia and acute kidney injury.  Assessment/Plan  Principal Problem:   Altered mental status Active Problems:   AKI (acute kidney injury) ([place])   Chronic systolic CHF (congestive heart failure) (HCC)   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   Chronic obstructive pulmonary disease, unspecified COPD type ([place])   Chronic respiratory failure with hypoxia (HCC)   Dementia (HCC)   Hypertension   Benign neoplasm of colon   Seizures ([place])   Status epilepticus ([place])   Protein calorie malnutrition ([place])   Hypothyroidism   Depression   ICD (implantable cardioverter-defibrillator) in place   Hyperkalemia   Assessment and Plan:  * Altered mental status Etiology workup in progress, differentials include UTI, pneumonia, however given patient is fairly altered and elevated lactic acid, sepsis cannot be excluded at this time Blood cultures x 2 are in process UA ordered and pending collection neck Procalcitonin, check digoxin level, B12 Admit to progressive cardiac, inpatient  AKI (acute kidney injury) ([place]) Presumed prerenal secondary to poor p.o. intake for several weeks Creatinine clearance calculated on admission was 6 mL/min Registered dietitian has been consulted Status post sodium chloride 1 L bolus and sodium chloride 500 mL bolus  per EDP Dextrose 5% solution at 100 mL/h, 10 hours ordered  Chronic systolic CHF (congestive heart failure) (HCC) Atorvastatin 80 mg nightly, digoxin 0.125 mg daily, Plavix 75 mg daily were resumed for 04/12/2022 Metoprolol succinate 25 mg nightly resumed  on day of admission  Chronic obstructive pulmonary disease, unspecified COPD type ([place]) Does not appear to be in acute exacerbation at this time Resumed home maintenance Symbicort alternative ([place]) 2 puff inhalation twice daily, albuterol nebulizer every 6 hours as needed for wheezing and shortness of breath  Dementia (HCC) Donepezil 10 mg nightly, memantine 10 mg p.o. twice daily were resumed on admission  Hyperkalemia Check serum magnesium level Status post calcium gluconate 1 g IV one-time dose, insulin 6 units with dextrose 50 1 amp, sodium bicarbonate 50 mill equivalent, sodium chloride 1 L bolus and additional sodium chloride 500 mL bolus ordered Scheduled potassium recheck, timed for 1700 on day of admission Dextrose 5% solution at 100 mL/h, continuous for 10 hours ordered Check BMP in the a.m.  Depression Hydroxyzine 25 mg nightly as needed for anxiety, itching Mirtazapine 7.5 mg p.o. twice daily resumed  Hypothyroidism Levothyroxine 100 mcg daily before breakfast resumed  Protein calorie malnutrition ([place]) Consult placed to registered dietitian  Seizures ([place]) With history of status epilepticus Resumed home Depakote 250 mg p.o. every 12 hours, gabapentin 300 mg p.o. twice daily as needed for pain and restlessness Ativan 2 mg IV as needed, 2 doses ordered with instructions to give an acute situation for breakthrough seizures and then let provider know Neurology consulted via staff message timed for 04/12/22 release and Epic order  Hypertension Metoprolol succinate 25 mg nightly resumed Home furosemide 20 mg every other day and losartan 12.5 mg nightly not resumed on admission due to acute kidney injury Hydralazine 5 mg IV every 8 hours as needed for SBP greater than 175, 4 days ordered  Chart reviewed.   DVT prophylaxis: Heparin 5000 units subcutaneous every 8 hours Code Status: full code Diet: Renal/carb modified Family Communication: called and updated spouse  [phone], for updates after [phone] (after 5p) Disposition Plan: Pending clinical course Consults called: Neurology via staff message Admission status: PCU, inpatient  Past Medical History:  Diagnosis Date   AAA (abdominal aortic aneurysm) ([place])    Anginal pain ([place])    Anxiety    Arthritis    Automatic implantable cardioverter-defibrillator in situ    Biventricular ICD  [employer]    a.  2011 s/p SJM CRT-D; b. 09/2016 gen change: SJM BiV AICD (ser# [id number]).   COPD (chronic obstructive pulmonary disease) (HCC)    Deafness    left ear   Dementia ([place])    Depression    Dizziness    Fracture    history of spinal fracture   GERD (gastroesophageal reflux disease)    HFrEF (heart failure with reduced ejection fraction) ([place])    a. 2010 Cath: nonobs dzs, EF 15-20%; b. 09/2017 Echo: EF suboptimal. EF low nl; c. 04/2018 Echo: EF 40-45%; d. 10/2021 Echo: EF 40-45%, glob HK, mildly red RV fxn, sev elev PASP, triv MR, mild-mod TR.   Hypertension    Hypothyroidism    Non-ischemic cardiomyopathy ([place])    a. 2010 Cath: nonobs dzs, EF 15-20%; b. 2011 s/p SJM CRT-D; c. 09/2016 s/p Gen change; d. 09/2017 Echo: EF suboptimal. EF low nl; e. 04/2018 Echo: EF 40-45%; f. 10/2021 Echo: EF 40-45%, glob HK, mildly red RV fxn, sev elev PASP, triv MR, mild-mod TR.   Oxygen dependent  Palpitations    Sleep apnea    Past Surgical History:  Procedure Laterality Date   ABDOMINAL HYSTERECTOMY     BIV ICD GENERATOR CHANGEOUT N/A 09/17/2016   Procedure: BiV ICD Generator Changeout;  Surgeon: Deboraha Sprang, MD;  Location: [facility];  Service: Cardiovascular;  Laterality: N/A;   BLADDER SURGERY     CARDIAC CATHETERIZATION     CARDIAC DEFIBRILLATOR PLACEMENT  4 yrs ago   pacemaker   CATARACT EXTRACTION W/PHACO Left 09/01/2014   Procedure: CATARACT EXTRACTION PHACO AND INTRAOCULAR LENS PLACEMENT ([place]);  Surgeon: Lyla Glassing, MD;  Location: [facility];  Service: Ophthalmology;  Laterality: Left;  [place]:  1:12.1    CATARACT EXTRACTION W/PHACO Right 09/29/2014   Procedure: CATARACT EXTRACTION PHACO AND INTRAOCULAR LENS PLACEMENT (IOC);  Surgeon: Lyla Glassing, MD;  Location: [facility];  Service: Ophthalmology;  Laterality: Right;  [place]: 00:52.7    CHOLECYSTECTOMY     COLON SURGERY     COLONOSCOPY     COLONOSCOPY N/A 08/31/2012   Procedure: COLONOSCOPY;  Surgeon: Inda Castle, MD;  Location: [facility];  Service: Endoscopy;  Laterality: N/A;   ESOPHAGOGASTRODUODENOSCOPY N/A 07/06/2012   Procedure: ESOPHAGOGASTRODUODENOSCOPY (EGD);  Surgeon: Lafayette Dragon, MD;  Location: [facility];  Service: Endoscopy;  Laterality: N/A;   INTRAMEDULLARY (IM) NAIL INTERTROCHANTERIC Right 10/23/2021   Procedure: INTRAMEDULLARY (IM) NAIL INTERTROCHANTERIC;  Surgeon: Corky Mull, MD;  Location: [facility];  Service: Orthopedics;  Laterality: Right;   KNEE SURGERY Right    MIDDLE EAR SURGERY     SAVORY DILATION N/A 07/06/2012   Procedure: SAVORY DILATION;  Surgeon: Lafayette Dragon, MD;  Location: [facility];  Service: Endoscopy;  Laterality: N/A;   TEE WITHOUT CARDIOVERSION N/A 06/23/2020   Procedure: TRANSESOPHAGEAL ECHOCARDIOGRAM (TEE);  Surgeon: Minna Merritts, MD;  Location: [facility];  Service: Cardiovascular;  Laterality: N/A;   WRIST SURGERY Right    Social History:  reports that she quit smoking about 20 years ago. Her smoking use included cigarettes. She has a 60.00 pack-year smoking history. She has never used smokeless tobacco. She reports that she does not drink alcohol and does not use drugs.  Allergies  Allergen Reactions   Codeine Palpitations   Contrast Media [Iodinated Contrast Media] Rash   Family History  Problem Relation Age of Onset   Hypertension Mother    Breast cancer Mother    Stroke Mother    Heart disease Father    Heart attack Sister    Hypertension Brother    Heart attack Sister    Colon cancer Brother 4   Family history: Family history reviewed and not  pertinent.  Prior to Admission medications   Medication Sig Start Date End Date Taking? Authorizing Provider  acetaminophen (TYLENOL) 650 MG CR tablet Take 650 mg by mouth at bedtime.   Yes [provider]  albuterol (ACCUNEB) 0.63 MG/3ML nebulizer solution Take 1 ampule by nebulization every 6 (six) hours as needed for wheezing.   Yes [provider]  albuterol (PROVENTIL HFA;VENTOLIN HFA) 108 (90 Base) MCG/ACT inhaler Inhale 2 puffs into the lungs every 6 (six) hours as needed. 11/02/16  Yes Schaevitz, Randall An, MD  allopurinol (ZYLOPRIM) 100 MG tablet Take 1 tablet (100 mg total) by mouth daily. 01/17/22  Yes Little Ishikawa, MD  atorvastatin (LIPITOR) 80 MG tablet Take 1 tablet (80 mg total) by mouth daily. 07/12/21 07/07/22 Yes Wellington Hampshire, MD  budesonide-formoterol (SYMBICORT) 160-4.5 MCG/ACT inhaler Inhale 2 puffs into  the lungs 2 (two) times daily.   Yes [provider]  clopidogrel (PLAVIX) 75 MG tablet Take 1 tablet (75 mg total) by mouth daily. 07/12/21  Yes Wellington Hampshire, MD  digoxin (LANOXIN) 0.125 MG tablet Take 1 tablet (0.125 mg total) by mouth daily. 03/11/22  Yes Wieting, Richard, MD  divalproex (DEPAKOTE SPRINKLE) 125 MG capsule Take 2 capsules (250 mg total) by mouth every 12 (twelve) hours. 03/10/22  Yes Wieting, Richard, MD  donepezil (ARICEPT) 10 MG tablet Take 10 mg by mouth at bedtime.   Yes [provider]  famotidine (PEPCID) 20 MG tablet Take 1 tablet (20 mg total) by mouth daily. 03/10/22 04/11/22 Yes Wieting, Richard, MD  furosemide (LASIX) 20 MG tablet Take 1 tablet (20 mg total) by mouth every other day. May take an additional tablet AS NEEDED for weight gain of 3 lbs overnight or 5 lbs in one week. Do not exceed more than 3 additional doses. 01/17/22  Yes Little Ishikawa, MD  gabapentin (NEURONTIN) 300 MG capsule Take 300 mg by mouth 2 (two) times daily as needed (pain/restlessness). 12/11/21  Yes [provider]   hydrOXYzine (VISTARIL) 25 MG capsule Take 25 mg by mouth at bedtime as needed. 02/13/22  Yes [provider]  levothyroxine (SYNTHROID) 100 MCG tablet Take 100 mcg by mouth daily. 01/04/22  Yes [provider]  losartan (COZAAR) 25 MG tablet Take 0.5 tablets (12.5 mg total) by mouth at bedtime. 03/10/22  Yes Wieting, Richard, MD  memantine (NAMENDA) 10 MG tablet Take 10 mg by mouth in the morning and at bedtime. 07/10/21  Yes [provider]  metoprolol succinate (TOPROL-XL) 25 MG 24 hr tablet Take 1 tablet (25 mg total) by mouth at bedtime. 03/10/22  Yes Wieting, Richard, MD  mirtazapine (REMERON) 7.5 MG tablet Take 7.5 mg by mouth 2 (two) times daily. 10/10/21  Yes [provider]  ondansetron (ZOFRAN) 4 MG tablet Take 1 tablet (4 mg total) by mouth every 8 (eight) hours as needed for nausea or vomiting. 03/10/22  Yes Wieting, Richard, MD  polyvinyl alcohol (LIQUIFILM TEARS) 1.4 % ophthalmic solution Place 1 drop into both eyes as needed for dry eyes. 03/10/22  Yes Wieting, Richard, MD  potassium chloride SA (KLOR-CON M) 20 MEQ tablet Take 1 tablet (20 mEq total) by mouth daily. 03/10/22  Yes Loletha Grayer, MD  tiotropium (SPIRIVA) 18 MCG inhalation capsule Place 1 capsule (18 mcg total) into inhaler and inhale daily as needed (shortness of breath). 04/12/18  Yes Gladstone Lighter, MD  vitamin B-12 (CYANOCOBALAMIN) 1000 MCG tablet Take 1,000 mcg by mouth 2 (two) times daily.    Yes [provider]  venlafaxine XR (EFFEXOR-XR) 75 MG 24 hr capsule Take 75 mg by mouth 2 (two) times daily. Patient not taking: Reported on 04/11/2022 10/21/21   [provider]   Physical Exam: Vitals:   04/11/22 1237 04/11/22 1239 04/11/22 1700  BP:  (!) 134/100 (!) 88/60  Pulse:  78 72  Resp:  (!) 22 18  Temp:  97.7 F (36.5 C)   TempSrc:  Oral   SpO2:  96% 100%  Weight: 62 kg     Constitutional: appears age appropriate, NAD, calm, comfortable Eyes: PERRL, lids and  conjunctivae normal ENMT: Mucous membranes are dry. Posterior pharynx clear of any exudate or lesions. Multiple teeth loss. Unable to assess hearing Neck: normal, supple, no masses, no thyromegaly Respiratory: clear to auscultation bilaterally, no wheezing, no crackles. Normal respiratory effort. No  accessory muscle use.  Cardiovascular: Regular rate and rhythm, no murmurs / rubs / gallops. No extremity edema. 2+ pedal pulses. No carotid bruits.  Abdomen: +suprapubic tenderness, no masses palpated, no hepatosplenomegaly. Bowel sounds positive.  Musculoskeletal: no clubbing / cyanosis. No joint deformity upper and lower extremities. Good ROM, no contractures, no atrophy. Normal muscle tone.  Skin: no rashes, lesions, ulcers. No induration Neurologic: Sensation intact. Strength 5/5 in all 4.  Psychiatric: Unable to assess judgment and insight. Alert and oriented x self and location.   EKG: independently reviewed, showing sinus rhythm with rate of 80, QTc 417  Chest x-ray on Admission: I personally reviewed and I agree with radiologist reading as below.  CT Renal Stone Study  Result Date: 04/11/2022 CLINICAL DATA:  Flank pain. EXAM: CT ABDOMEN AND PELVIS WITHOUT CONTRAST TECHNIQUE: Multidetector CT imaging of the abdomen and pelvis was performed following the standard protocol without IV contrast. RADIATION DOSE REDUCTION: This exam was performed according to the departmental dose-optimization program which includes automated exposure control, adjustment of the mA and/or kV according to patient size and/or use of iterative reconstruction technique. COMPARISON:  December 15, 2019. FINDINGS: Lower chest: No acute abnormality. Hepatobiliary: No focal liver abnormality is seen. Status post cholecystectomy. No biliary dilatation. Pancreas: Unremarkable. No pancreatic ductal dilatation or surrounding inflammatory changes. Spleen: Normal in size without focal abnormality. Adrenals/Urinary Tract: Adrenal glands  appear normal. Small exophytic cyst is seen involving left kidney for which no further follow-up is required. No hydronephrosis or renal obstruction is noted. No renal or ureteral calculi are noted. Urinary bladder is decompressed. Stomach/Bowel: The stomach is unremarkable. There is no evidence of bowel obstruction or inflammation. Postsurgical changes are seen involving sigmoid colon. Vascular/Lymphatic: Aortic atherosclerosis. No enlarged abdominal or pelvic lymph nodes. Reproductive: Status post hysterectomy. No adnexal masses. Other: No abdominal wall hernia or abnormality. No abdominopelvic ascites. Musculoskeletal: Old L1 compression fracture is noted. No acute osseous abnormality is noted. IMPRESSION: No acute abnormality seen in the abdomen or pelvis. Aortic Atherosclerosis (ICD10-I70.0). Electronically Signed   By: Marijo Conception M.D.   On: 04/11/2022 14:27   CT HEAD WO CONTRAST (5MM)  Result Date: 04/11/2022 CLINICAL DATA:  Altered mental status EXAM: CT HEAD WITHOUT CONTRAST TECHNIQUE: Contiguous axial images were obtained from the base of the skull through the vertex without intravenous contrast. RADIATION DOSE REDUCTION: This exam was performed according to the departmental dose-optimization program which includes automated exposure control, adjustment of the mA and/or kV according to patient size and/or use of iterative reconstruction technique. COMPARISON:  CT head 03/06/2018 FINDINGS: Brain: There is no acute intracranial hemorrhage, extra-axial fluid collection, or acute infarct Background parenchymal volume loss with prominence of the ventricular system and extra-axial CSF spaces is unchanged. The ventricles are stable in size. Multifocal cortical encephalomalacia in both cerebral hemispheres, most notably in the left parietal and occipital lobes, is unchanged. Additional patchy hypodensity in the remainder of the white matter likely reflecting sequela of underlying chronic small-vessel  ischemic change is also stable. The pituitary and suprasellar region are normal. There is no mass lesion there is no mass effect or midline shift. Vascular: There is calcification of the bilateral carotid siphons. Skull: Normal. Negative for fracture or focal lesion. Sinuses/Orbits: The imaged paranasal sinuses are clear. Bilateral lens implants are in place. The globes and orbits are otherwise unremarkable. Other: None. IMPRESSION: Stable noncontrast head CT with no acute intracranial pathology. Electronically Signed   By: Valetta Mole M.D.   On: 04/11/2022  14:04   DG Chest Port 1 View  Result Date: 04/11/2022 CLINICAL DATA:  Altered mental status. EXAM: PORTABLE CHEST 1 VIEW COMPARISON:  Chest radiograph 03/06/2022 FINDINGS: The left chest wall cardiac device and associated leads are stable. The cardiomediastinal silhouette is stable The lungs are clear, with no focal consolidation or pulmonary edema. There is no pleural effusion or pneumothorax There is no acute osseous abnormality. IMPRESSION: Stable chest with no radiographic evidence of acute cardiopulmonary process. Electronically Signed   By: Valetta Mole M.D.   On: 04/11/2022 13:55    Labs on Admission: I have personally reviewed following labs  CBC: Recent Labs  Lab 04/11/22 1252  WBC 9.3  NEUTROABS 6.5  HGB 10.1*  HCT 34.5*  MCV 82.3  PLT [id number]*   Basic Metabolic Panel: Recent Labs  Lab 04/11/22 1252  NA 148*  K 6.3*  CL 116*  CO2 21*  GLUCOSE 114*  BUN 62*  CREATININE 3.72*  CALCIUM 9.3   GFR: Estimated Creatinine Clearance: 11.3 mL/min (A) (by C-G formula based on SCr of 3.72 mg/dL (H)).  Liver Function Tests: Recent Labs  Lab 04/11/22 1252  AST 45*  ALT 19  ALKPHOS 151*  BILITOT 0.3  PROT 7.7  ALBUMIN 2.7*   Recent Labs  Lab 04/11/22 1253  AMMONIA 25   Coagulation Profile: Recent Labs  Lab 04/11/22 1252  INR 1.1   Urine analysis:    Component Value Date/Time   COLORURINE YELLOW (A) 03/06/2022  2318   APPEARANCEUR HAZY (A) 03/06/2022 2318   APPEARANCEUR Clear 07/31/2013 1658   LABSPEC 1.013 03/06/2022 2318   LABSPEC 1.018 07/31/2013 1658   PHURINE 5.0 03/06/2022 2318   GLUCOSEU NEGATIVE 03/06/2022 2318   GLUCOSEU Negative 07/31/2013 1658   HGBUR NEGATIVE 03/06/2022 2318   BILIRUBINUR NEGATIVE 03/06/2022 2318   BILIRUBINUR Negative 07/31/2013 1658   KETONESUR NEGATIVE 03/06/2022 2318   PROTEINUR 30 (A) 03/06/2022 2318   UROBILINOGEN 0.2 03/28/2012 1526   NITRITE NEGATIVE 03/06/2022 2318   LEUKOCYTESUR NEGATIVE 03/06/2022 2318   LEUKOCYTESUR Negative 07/31/2013 1658   CRITICAL CARE Performed by: Dr. Tobie Poet  Total critical care time: 35 minutes  Critical care time was exclusive of separately billable procedures and treating other patients.  Critical care was necessary to treat or prevent imminent or life-threatening deterioration.  Critical care was time spent personally by me on the following activities: development of treatment plan with patient and/or surrogate as well as nursing, discussions with consultants, evaluation of patient's response to treatment, examination of patient, obtaining history from patient or surrogate, ordering and performing treatments and interventions, ordering and review of laboratory studies, ordering and review of radiographic studies, pulse oximetry and re-evaluation of patient's condition.  This document was prepared using Dragon Voice Recognition software and may include unintentional dictation errors.  Dr. Tobie Poet [facility]  If 7PM-7AM, please contact overnight-coverage provider If 7AM-7PM, please contact day coverage provider [email or web address]  04/11/2022, 5:25 PM

## 2022-04-11 NOTE — Progress Notes (Signed)
       CROSS COVER NOTE  NAME: Stacy Grimes MRN: [id number] DOB : 09-Nov-1950    HPI/Events of Note   Report:via chat from RN digoxin level 3.2  On review of chart:creatinine 3.72, CrCL 11.3 ml/min, K+ level at 1905 5.4    Assessment and  Interventions   Assessment: Review of tele, SR no ventricular arrhythmia; rate 79 Plan: Per pharm recommendations 2 vials digiband ordered Hold lopressor dose tonight Continue to monitor on [place] NP [facility]

## 2022-04-11 NOTE — Assessment & Plan Note (Addendum)
Etiology workup in progress, differentials include UTI, pneumonia, however given patient is fairly altered and elevated lactic acid, sepsis cannot be excluded at this time Blood cultures x 2 are in process UA ordered and pending collection neck Procalcitonin, check digoxin level, B12 Admit to progressive cardiac, inpatient

## 2022-04-11 NOTE — Assessment & Plan Note (Addendum)
Check serum magnesium level Status post calcium gluconate 1 g IV one-time dose, insulin 6 units with dextrose 50 1 amp, sodium bicarbonate 50 mill equivalent, sodium chloride 1 L bolus and additional sodium chloride 500 mL bolus ordered Scheduled potassium recheck, timed for 1700 on day of admission Dextrose 5% solution at 100 mL/h, continuous for 10 hours ordered Check BMP in the a.m.

## 2022-04-11 NOTE — ED Provider Notes (Addendum)
[facility] Provider Note    Event Date/Time   First MD Initiated Contact with Patient 04/11/22 1228     (approximate)   History   Level V Caveat"  AMS   HPI  Stacy Grimes is a 72 y.o. female with an extensive past medical history including COPD, dementia, heart failure nonischemic cardiomyopathy presents to the ER for several days of altered mental status.  Reportedly was had EMS called out to home by family 3 days ago for altered mental status and agitation.  They refused transport to the ER and stated they would bring her if she got worse.  Today there is no report of any worsening symptoms but had not shown any improvement.  Patient is not able to provide any additional history.      Physical Exam   Triage Vital Signs: ED Triage Vitals  Enc Vitals Group     BP      Pulse      Resp      Temp      Temp src      SpO2      Weight      Height      Head Circumference      Peak Flow      Pain Score      Pain Loc      Pain Edu?      Excl. in [place]?     Most recent vital signs: Vitals:   04/11/22 1239  BP: (!) 134/100  Pulse: 78  Resp: (!) 22  Temp: 97.7 F (36.5 C)  SpO2: 96%     Constitutional: Alert, looking about room moving all extremities but unable to answer questions.  Eyes: Conjunctivae are normal.  Head: Atraumatic. Nose: No congestion/rhinnorhea. Mouth/Throat: Mucous membranes are moist.   Neck: Painless ROM.  Cardiovascular:   Good peripheral circulation.  No m/g/r Respiratory: Normal respiratory effort.  No retractions.  Gastrointestinal: Soft and nontender.  Musculoskeletal:  no deformity Neurologic:  MAE spontaneously. No gross focal neurologic deficits are appreciated.  Skin:  Skin is warm, dry and intact. No rash noted. Psychiatric: Mood and affect are normal. Speech and behavior are normal.    ED Results / Procedures / Treatments   Labs (all labs ordered are listed, but only abnormal results are  displayed) Labs Reviewed  COMPREHENSIVE METABOLIC PANEL - Abnormal; Notable for the following components:      Result Value   Sodium 148 (*)    Potassium 6.3 (*)    Chloride 116 (*)    CO2 21 (*)    Glucose, Bld 114 (*)    BUN 62 (*)    Creatinine, Ser 3.72 (*)    Albumin 2.7 (*)    AST 45 (*)    Alkaline Phosphatase 151 (*)    GFR, Estimated 12 (*)    All other components within normal limits  LACTIC ACID, PLASMA - Abnormal; Notable for the following components:   Lactic Acid, Venous 3.5 (*)    All other components within normal limits  CBC WITH DIFFERENTIAL/PLATELET - Abnormal; Notable for the following components:   Hemoglobin 10.1 (*)    HCT 34.5 (*)    MCH 24.1 (*)    MCHC 29.3 (*)    RDW 20.4 (*)    Platelets 141 (*)    nRBC 0.3 (*)    All other components within normal limits  BLOOD GAS, VENOUS - Abnormal; Notable for the following components:  Acid-base deficit 3.3 (*)    All other components within normal limits  RESP PANEL BY RT-PCR (RSV, FLU A&B, COVID)  RVPGX2  CULTURE, BLOOD (ROUTINE X 2)  CULTURE, BLOOD (ROUTINE X 2)  PROTIME-INR  AMMONIA  LACTIC ACID, PLASMA  URINALYSIS, ROUTINE W REFLEX MICROSCOPIC     EKG  ED ECG REPORT I, Merlyn Lot, the attending physician, personally viewed and interpreted this ECG.   Date: 04/11/2022  EKG Time: 12:39  Rate: 80  Rhythm: sinus  Axis: normal  Intervals: normal  ST&T Change: no stmei, nonspeciifc st abn    RADIOLOGY Please see ED Course for my review and interpretation.  I personally reviewed all radiographic images ordered to evaluate for the above acute complaints and reviewed radiology reports and findings.  These findings were personally discussed with the patient.  Please see medical record for radiology report.    PROCEDURES:  Critical Care performed: Yes, see critical care procedure note(s)  .Critical Care  Performed by: Merlyn Lot, MD Authorized by: Merlyn Lot, MD    Critical care provider statement:    Critical care time (minutes):  40   Critical care was necessary to treat or prevent imminent or life-threatening deterioration of the following conditions:  Renal failure   Critical care was time spent personally by me on the following activities:  Ordering and performing treatments and interventions, ordering and review of laboratory studies, ordering and review of radiographic studies, pulse oximetry, re-evaluation of patient's condition, review of old charts, obtaining history from patient or surrogate, examination of patient, evaluation of patient's response to treatment, discussions with primary provider, discussions with consultants and development of treatment plan with patient or surrogate    MEDICATIONS ORDERED IN ED: Medications  cefTRIAXone (ROCEPHIN) 2 g in sodium chloride 0.9 % 100 mL IVPB (2 g Intravenous New Bag/Given 04/11/22 1526)  sodium chloride 0.9 % bolus 500 mL (has no administration in time range)  sodium chloride 0.9 % bolus 1,000 mL (1,000 mLs Intravenous New Bag/Given 04/11/22 1349)  dextrose 50 % solution 50 mL (50 mLs Intravenous Given 04/11/22 1429)  insulin aspart (novoLOG) injection 6 Units (6 Units Intravenous Given 04/11/22 1429)  sodium bicarbonate injection 50 mEq (50 mEq Intravenous Given 04/11/22 1429)  calcium gluconate inj 10% (1 g) URGENT USE ONLY! (1 g Intravenous Given 04/11/22 1428)     IMPRESSION / MDM / [place] / ED COURSE  I reviewed the triage vital signs and the nursing notes.                              Differential diagnosis includes, but is not limited to, Dehydration, sepsis, pna, uti, hypoglycemia, cva, drug effect, withdrawal, encephalitis   Patient presenting to the ER for evaluation of symptoms as described above.  Based on symptoms, risk factors and considered above differential, this presenting complaint could reflect a potentially life-threatening illness therefore the patient will be placed  on continuous pulse oximetry and telemetry for monitoring.  Laboratory evaluation will be sent to evaluate for the above complaints.     Clinical Course as of 04/11/22 1547  Thu Apr 11, 2022  1342 CT scan of the head on my review and interpretation without acute abnormality. [PR]  [place] is elevated.  Has significant AKI.  No leukocytosis.  IV fluids ordered. [PR]  1400 Patient's potassium is elevated to 6.3.  Temporized with IV insulin bicarb calcium as well as [place].  Expect this will improve with IV hydration. [PR]  [id number] CT without evidence of acute abnormality.  Patient receiving IV fluids.  Given her elevated lactate will be covered with IV Rocephin given possible urosource but still did not have urine she is not produced any.  Likely significantly dehydrated given her altered mental status and poor p.o. intake over the past few days.  She is otherwise hemodynamically stable and be appropriate for admission to the hospital. [PR]  1546 Patient tolerating fluid bolus without increased O2 requirement she is requesting something to drink. [PR]    Clinical Course User Index [PR] Merlyn Lot, MD    FINAL CLINICAL IMPRESSION(S) / ED DIAGNOSES   Final diagnoses:  Altered mental status, unspecified altered mental status type  AKI (acute kidney injury) ([place])  Hyperkalemia     Rx / DC Orders   ED Discharge Orders     None        Note:  This document was prepared using Dragon voice recognition software and may include unintentional dictation errors.      Merlyn Lot, MD 04/11/22 1507    Merlyn Lot, MD 04/11/22 [phone]

## 2022-04-11 NOTE — Assessment & Plan Note (Addendum)
With history of status epilepticus Resumed home Depakote 250 mg p.o. every 12 hours, gabapentin 300 mg p.o. twice daily as needed for pain and restlessness Ativan 2 mg IV as needed, 2 doses ordered with instructions to give an acute situation for breakthrough seizures and then let provider know Neurology consulted via staff message timed for 04/12/22 release and Epic order

## 2022-04-11 NOTE — Assessment & Plan Note (Signed)
Atorvastatin 80 mg nightly, digoxin 0.125 mg daily, Plavix 75 mg daily were resumed for 04/12/2022 Metoprolol succinate 25 mg nightly resumed on day of admission

## 2022-04-12 DIAGNOSIS — G928 Other toxic encephalopathy: Secondary | ICD-10-CM

## 2022-04-12 DIAGNOSIS — E875 Hyperkalemia: Secondary | ICD-10-CM | POA: Diagnosis not present

## 2022-04-12 DIAGNOSIS — N179 Acute kidney failure, unspecified: Secondary | ICD-10-CM | POA: Diagnosis not present

## 2022-04-12 DIAGNOSIS — E878 Other disorders of electrolyte and fluid balance, not elsewhere classified: Secondary | ICD-10-CM | POA: Diagnosis not present

## 2022-04-12 DIAGNOSIS — E440 Moderate protein-calorie malnutrition: Secondary | ICD-10-CM | POA: Insufficient documentation

## 2022-04-12 DIAGNOSIS — R410 Disorientation, unspecified: Secondary | ICD-10-CM

## 2022-04-12 DIAGNOSIS — N390 Urinary tract infection, site not specified: Principal | ICD-10-CM

## 2022-04-12 DIAGNOSIS — E870 Hyperosmolality and hypernatremia: Secondary | ICD-10-CM

## 2022-04-12 LAB — BASIC METABOLIC PANEL
Anion gap: 6 (ref 5–15)
Anion gap: 9 (ref 5–15)
BUN: 54 mg/dL — ABNORMAL HIGH (ref 8–23)
BUN: 55 mg/dL — ABNORMAL HIGH (ref 8–23)
CO2: 26 mmol/L (ref 22–32)
CO2: 22 mmol/L (ref 22–32)
Calcium: 8.8 mg/dL — ABNORMAL LOW (ref 8.9–10.3)
Calcium: 9.1 mg/dL (ref 8.9–10.3)
Chloride: 117 mmol/L — ABNORMAL HIGH (ref 98–111)
Chloride: 117 mmol/L — ABNORMAL HIGH (ref 98–111)
Creatinine, Ser: 3.26 mg/dL — ABNORMAL HIGH (ref 0.44–1.00)
Creatinine, Ser: 3.2 mg/dL — ABNORMAL HIGH (ref 0.44–1.00)
GFR, Estimated: 14 mL/min — ABNORMAL LOW (ref >60)
GFR, Estimated: 15 mL/min — ABNORMAL LOW (ref >60)
Glucose, Bld: 66 mg/dL — ABNORMAL LOW (ref 70–99)
Glucose, Bld: 144 mg/dL — ABNORMAL HIGH (ref 70–99)
Potassium: 5.9 mmol/L — ABNORMAL HIGH (ref 3.5–5.1)
Potassium: 4.9 mmol/L (ref 3.5–5.1)
Sodium: 149 mmol/L — ABNORMAL HIGH (ref 135–145)
Sodium: 148 mmol/L — ABNORMAL HIGH (ref 135–145)

## 2022-04-12 LAB — GLUCOSE, CAPILLARY
Glucose-Capillary: 74 mg/dL (ref 70–99)
Glucose-Capillary: 159 mg/dL — ABNORMAL HIGH (ref 70–99)
Glucose-Capillary: 104 mg/dL — ABNORMAL HIGH (ref 70–99)

## 2022-04-12 LAB — CBC
HCT: 29 % — ABNORMAL LOW (ref 36.0–46.0)
Hemoglobin: 8.5 g/dL — ABNORMAL LOW (ref 12.0–15.0)
MCH: 24.1 pg — ABNORMAL LOW (ref 26.0–34.0)
MCHC: 29.3 g/dL — ABNORMAL LOW (ref 30.0–36.0)
MCV: 82.4 fL (ref 80.0–100.0)
Platelets: UNDETERMINED 10*3/uL (ref 150–400)
RBC: 3.52 MIL/uL — ABNORMAL LOW (ref 3.87–5.11)
RDW: 20.7 % — ABNORMAL HIGH (ref 11.5–15.5)
WBC: 9.4 10*3/uL (ref 4.0–10.5)
nRBC: 0 % (ref 0.0–0.2)

## 2022-04-12 LAB — VITAMIN B12: Vitamin B-12: >7500 pg/mL — ABNORMAL HIGH (ref 180–914)

## 2022-04-12 LAB — POTASSIUM
Potassium: 4.3 mmol/L (ref 3.5–5.1)
Potassium: 4.5 mmol/L (ref 3.5–5.1)
Potassium: 4.2 mmol/L (ref 3.5–5.1)

## 2022-04-12 LAB — CK: Total CK: 214 U/L (ref 38–234)

## 2022-04-12 LAB — VALPROIC ACID LEVEL: Valproic Acid Lvl: 40 ug/mL — ABNORMAL LOW (ref 50.0–100.0)

## 2022-04-12 MED ORDER — SODIUM ZIRCONIUM CYCLOSILICATE 10 G PO PACK
10.0000 g | PACK | Freq: Two times a day (BID) | ORAL | Status: AC
  Administered 2022-04-12: 10 g via ORAL
  Filled 2022-04-12 (×2): qty 1

## 2022-04-12 MED ORDER — ADULT MULTIVITAMIN W/MINERALS CH
1.0000 | ORAL_TABLET | Freq: Every day | ORAL | Status: DC
  Administered 2022-04-12 – 2022-04-25 (×14): 1 via ORAL
  Filled 2022-04-12 (×14): qty 1

## 2022-04-12 MED ORDER — DEXTROSE 5 % IV SOLN: INTRAVENOUS | Status: DC

## 2022-04-12 MED ORDER — ONDANSETRON HCL 4 MG/2ML IJ SOLN: 4.0000 mg | Freq: Four times a day (QID) | INTRAMUSCULAR | Status: DC | PRN

## 2022-04-12 MED ORDER — IPRATROPIUM-ALBUTEROL 0.5-2.5 (3) MG/3ML IN SOLN: 3.0000 mL | RESPIRATORY_TRACT | Status: DC | PRN

## 2022-04-12 MED ORDER — CALCIUM GLUCONATE-NACL 1-0.675 GM/50ML-% IV SOLN
1.0000 g | Freq: Once | INTRAVENOUS | Status: AC
  Administered 2022-04-12: 1000 mg via INTRAVENOUS
  Filled 2022-04-12: qty 50

## 2022-04-12 MED ORDER — PROSOURCE PLUS PO LIQD
30.0000 mL | Freq: Three times a day (TID) | ORAL | Status: DC
  Administered 2022-04-12 – 2022-04-16 (×12): 30 mL via ORAL
  Filled 2022-04-12 (×10): qty 30

## 2022-04-12 MED ORDER — TRAZODONE HCL 50 MG PO TABS
50.0000 mg | ORAL_TABLET | Freq: Every evening | ORAL | Status: DC | PRN
  Administered 2022-04-15 – 2022-04-23 (×4): 50 mg via ORAL
  Filled 2022-04-12 (×5): qty 1

## 2022-04-12 MED ORDER — SENNOSIDES-DOCUSATE SODIUM 8.6-50 MG PO TABS: 1.0000 | ORAL_TABLET | Freq: Every evening | ORAL | Status: DC | PRN

## 2022-04-12 MED ORDER — MIRTAZAPINE 15 MG PO TABS: 7.5000 mg | ORAL_TABLET | Freq: Every day | ORAL | Status: DC

## 2022-04-12 MED ORDER — METOPROLOL TARTRATE 5 MG/5ML IV SOLN: 5.0000 mg | INTRAVENOUS | Status: DC | PRN

## 2022-04-12 MED ORDER — HYDRALAZINE HCL 20 MG/ML IJ SOLN: 10.0000 mg | INTRAMUSCULAR | Status: DC | PRN

## 2022-04-12 MED ORDER — MIRTAZAPINE 15 MG PO TABS
7.5000 mg | ORAL_TABLET | Freq: Every day | ORAL | Status: DC
  Administered 2022-04-12 – 2022-04-24 (×13): 7.5 mg via ORAL
  Filled 2022-04-12 (×13): qty 1

## 2022-04-12 MED ORDER — GABAPENTIN 100 MG PO CAPS
100.0000 mg | ORAL_CAPSULE | Freq: Two times a day (BID) | ORAL | Status: DC | PRN
  Administered 2022-04-12: 100 mg via ORAL
  Filled 2022-04-12: qty 1

## 2022-04-12 MED ORDER — INSULIN ASPART 100 UNIT/ML IV SOLN
5.0000 [IU] | Freq: Once | INTRAVENOUS | Status: AC
  Administered 2022-04-12: 5 [IU] via INTRAVENOUS
  Filled 2022-04-12: qty 0.05

## 2022-04-12 MED ORDER — SODIUM CHLORIDE 0.9 % IV BOLUS: 500.0000 mL | Freq: Once | INTRAVENOUS | Status: DC

## 2022-04-12 MED ORDER — DEXTROSE 50 % IV SOLN
1.0000 | Freq: Once | INTRAVENOUS | Status: AC
  Administered 2022-04-12: 50 mL via INTRAVENOUS
  Filled 2022-04-12: qty 50

## 2022-04-12 MED ORDER — IPRATROPIUM-ALBUTEROL 0.5-2.5 (3) MG/3ML IN SOLN
3.0000 mL | Freq: Once | RESPIRATORY_TRACT | Status: AC
  Administered 2022-04-12: 3 mL via RESPIRATORY_TRACT
  Filled 2022-04-12: qty 3

## 2022-04-12 NOTE — Progress Notes (Addendum)
Initial Nutrition Assessment  DOCUMENTATION CODES:   Non-severe (moderate) malnutrition in context of chronic illness  INTERVENTION:   -30 ml Prosource Plus TID, each supplement provides 100 kcals and 15 grams protein -MVI with minerals daily -Magic cup TID with meals, each supplement provides 290 kcal and 9 grams of protein  -Feeding assistance with meals  NUTRITION DIAGNOSIS:   Moderate Malnutrition related to chronic illness (dementia) as evidenced by mild fat depletion, moderate fat depletion, mild muscle depletion, moderate muscle depletion, percent weight loss.  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  Pt admitted with altered mental status, AKI, and CHF.   3/8- s/p BSE- advanced to dysphagia 2 diet with thin liquids  Reviewed I/O's: +2.4 L x 24 hours  UOP: 100 ml x 24 hours  Per H&P, pt has had decreased oral intake since December 2023. She is bedbound, oriented to self, and incontinent. She has been sleeping a lot. Noted remeron was d/c as an outpatient due to somnolence.   Pt lying in bed at time of visit. No family at bedside. Pt did not arouse to voice or touch.   Observed lucnh tray, which was untouched. Pt consumed a few bites of applesauce and few sips of juice (like for med pass and SLP evaluation).    Reviewed wt hx; pt has experienced 17.7% wt loss over the past 3 months, which is significant for time frame. Noted pt with edema, which may be masking further weight loss as well as fat and muscle depletions.   Pt meets criteria for moderate malnutrition. If  mental status and oral intake does not improve, may need to consider initiation of nutrition support if this aligns with pt's goals of care.   Per TOC notes, plan to d/c home with home health services.   Medications reviewed and include remeron, lokelma, and dextrose 5% 5% solution @ 100 ml/hr.   Lab Results  Component Value Date   HGBA1C 7.0 (H) 10/23/2021   PTA DM medications are none.   Labs reviewed: Na: 148, CBGS: 104-159 (inpatient orders for glycemic control are none).    NUTRITION - FOCUSED PHYSICAL EXAM:  Flowsheet Row Most Recent Value  Orbital Region No depletion  Upper Arm Region Moderate depletion  Thoracic and Lumbar Region No depletion  Buccal Region Mild depletion  Temple Region Mild depletion  Clavicle Bone Region No depletion  Clavicle and Acromion Bone Region No depletion  Scapular Bone Region No depletion  Dorsal Hand Mild depletion  Patellar Region Moderate depletion  Anterior Thigh Region Moderate depletion  Posterior Calf Region Moderate depletion  Edema (RD Assessment) Mild  Hair Reviewed  Eyes Reviewed  Mouth Reviewed  Skin Reviewed  Nails Reviewed       Diet Order:   Diet Order             DIET DYS 2 Room service appropriate? Yes with Assist; Fluid consistency: Thin  Diet effective now                   EDUCATION NEEDS:   Education needs have been addressed  Skin:  Skin Assessment: Reviewed RN Assessment  Last BM:  04/11/22  Height:   Ht Readings from Last 1 Encounters:  04/11/22 '5\' 3"'$  (1.6 m)    Weight:   Wt Readings from Last 1 Encounters:  04/11/22 59 kg    Ideal Body Weight:  52.3 kg  BMI:  Body mass index is 23.03 kg/m.  Estimated Nutritional Needs:   Kcal:  [id number]  Protein:  90-105 grams  Fluid:  > 1.7 L    Loistine Chance, RD, LDN, [place] Registered Dietitian II Certified Diabetes Care and Education Specialist Please refer to [facility] for RD and/or RD on-call/weekend/after hours pager

## 2022-04-12 NOTE — Progress Notes (Addendum)
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  Patient was noted to have hypocalcemia, lactic acidosis, acute kidney injury, hyponatremia and hyperkalemia.  Patient was given IV fluids, calcium, treatment for hyperkalemia.   Assessment & Plan:  Principal Problem:   Altered mental status Active Problems:   AKI (acute kidney injury) ([place])   Chronic systolic CHF (congestive heart failure) (HCC)   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   Chronic obstructive pulmonary disease, unspecified COPD type (HCC)   Chronic respiratory failure with hypoxia (HCC)   Dementia ([place])   Hypertension   Benign neoplasm of colon   Seizures ([place])   Status epilepticus ([place])   Protein calorie malnutrition ([place])   Hypothyroidism   Depression   ICD (implantable cardioverter-defibrillator) in place   Hyperkalemia     Assessment and Plan: * Altered mental status Urinary tract infection, mild hematuria.  Unclear etiology, suspect underlying infection.  Follow culture data.  CT of the head is negative.  CT renal study is negative.  Chest x-ray appears to be stable.  On empiric IV Rocephin. Lactic acidosis resolved, CK normal Flu/COVID/RSV negative Ammonia normal, B12 greater than 7500, check TSH, folate, vitamin D  Hyperkalemia/ HyperNatremia Improved with basic hyperkalemia treatment in the ED.  This morning 4.9. No acute EKG changes.  Shows a sensed V paced rhythm Cont D5W IVF  Elevated digoxin levels -Hold Dig  AKI (acute kidney injury) ([place]) Admission creatinine 3.72, continue IV fluids.  Baseline Cr 0.8. Slowly improving. Reduce gabapentin.   Seizures ([place]) Does have history  of status epilepticus.  Currently on Depakote.  Seen by neuro, no signs of seizure.   Chronic systolic CHF (congestive heart failure) (HCC) Dilated cardiomyopathy status post AICD-[employer] Not an active exacerbation.  Patient is on Plavix, statin, Toprol-XL.  Previous echo in September 2023 showed EF of 45%. Digoxin level elevated, will hold  Chronic obstructive pulmonary disease, unspecified COPD type ([place]) Continue home bronchodilators, as needed added  Dementia (HCC) Donepezil and memantine  Depression Continue hydroxyzine and mirtazapine  Hypothyroidism Levothyroxine.  Check TSH  Protein calorie malnutrition ([place]) Consult placed to registered dietitian  Hypertension Lasix and losartan on hold.  Currently on Toprol-XL.  IV as needed.      DVT prophylaxis: heparin injection 5,000 Units Start: 04/11/22 2200 Place TED hose Start: 04/11/22 1607 Code Status: Full Code Family Communication:  Lauretta Chester.   Status is: Inpatient Remains inpatient appropriate because: On going management for confusion.     Subjective: Awake but confused. Asking for water.  Spoke with her husband, her symptoms started about 4-5 days ago and was progressive.Poor PO intake, more lethargy, confusion.    Examination:  General exam: elderly frail. Mittens in place.  Respiratory system: Clear to auscultation. Respiratory effort normal. Cardiovascular system: S1 & S2 heard, RRR. No JVD, murmurs, rubs, gallops or clicks. No pedal edema. Gastrointestinal system: Abdomen is nondistended, soft and nontender. No organomegaly or masses felt. Normal bowel sounds heard. Central nervous system: Alert and oriented to name only.  Extremities: Symmetric 4 x 5 power. Skin: No rashes, lesions or ulcers Psychiatry: Judgement and insight appearPoor   Objective: Vitals:   04/12/22 0400 04/12/22 0405 04/12/22 0500 04/12/22 0752  BP:  Marland Kitchen)  100/46 (!) 93/41 98/66  Pulse:  80 74 77  Resp:  '15 14 18  '$ Temp:   97.6 F (36.4 C)  97.8 F (36.6 C)  TempSrc:  Axillary  Axillary  SpO2: 100% 100% 100% 96%  Weight:      Height:        Intake/Output Summary (Last 24 hours) at 04/12/2022 [id number] Last data filed at 04/12/2022 0500 Gross per 24 hour  Intake 2486.23 ml  Output 100 ml  Net 2386.23 ml   Filed Weights   04/11/22 1237 04/11/22 1832  Weight: 62 kg 59 kg     Data Reviewed:   CBC: Recent Labs  Lab 04/11/22 1252 04/12/22 0444  WBC 9.3 9.4  NEUTROABS 6.5  --   HGB 10.1* 8.5*  HCT 34.5* 29.0*  MCV 82.3 82.4  PLT 141* PLATELET CLUMPS NOTED ON SMEAR, UNABLE TO ESTIMATE   Basic Metabolic Panel: Recent Labs  Lab 04/11/22 1252 04/11/22 1905 04/12/22 0154 04/12/22 0444  NA 148*  --  149* 148*  K 6.3* 5.4* 5.9* 4.9  CL 116*  --  117* 117*  CO2 21*  --  26 22  GLUCOSE 114*  --  66* 144*  BUN 62*  --  54* 55*  CREATININE 3.72*  --  3.26* 3.20*  CALCIUM 9.3  --  8.8* 9.1  MG  --  2.0  --   --    GFR: Estimated Creatinine Clearance: 13.1 mL/min (A) (by C-G formula based on SCr of 3.2 mg/dL (H)). Liver Function Tests: Recent Labs  Lab 04/11/22 1252  AST 45*  ALT 19  ALKPHOS 151*  BILITOT 0.3  PROT 7.7  ALBUMIN 2.7*   No results for input(s): "LIPASE", "AMYLASE" in the last 168 hours. Recent Labs  Lab 04/11/22 1253  AMMONIA 25   Coagulation Profile: Recent Labs  Lab 04/11/22 1252  INR 1.1   Cardiac Enzymes: Recent Labs  Lab 04/12/22 0154  CKTOTAL 214   BNP (last 3 results) No results for input(s): "PROBNP" in the last 8760 hours. HbA1C: No results for input(s): "HGBA1C" in the last 72 hours. CBG: Recent Labs  Lab 04/12/22 0310 04/12/22 0405  GLUCAP 74 159*   Lipid Profile: No results for input(s): "CHOL", "HDL", "LDLCALC", "TRIG", "CHOLHDL", "LDLDIRECT" in the last 72 hours. Thyroid Function Tests: No results for input(s): "TSH", "T4TOTAL", "FREET4", "T3FREE", "THYROIDAB" in the last 72 hours. Anemia Panel: Recent Labs    04/11/22 1907   VITAMINB12 >7,500*   Sepsis Labs: Recent Labs  Lab 04/11/22 1252 04/11/22 1905 04/11/22 1932  PROCALCITON  --  0.21  --   LATICACIDVEN 3.5*  --  1.8    Recent Results (from the past 240 hour(s))  Culture, blood (Routine x 2)     Status: None (Preliminary result)   Collection Time: 04/11/22 12:52 PM   Specimen: BLOOD  Result Value Ref Range Status   Specimen Description BLOOD BLOOD RIGHT ARM  Final   Special Requests   Final    BOTTLES DRAWN AEROBIC AND ANAEROBIC Blood Culture adequate volume   Culture   Final    NO GROWTH < 24 HOURS Performed at [facility], [address] [place], [place] [address]    Report Status PENDING  Incomplete  Culture, blood (Routine x 2)     Status: None (Preliminary result)   Collection Time: 04/11/22 12:53 PM   Specimen: BLOOD  Result Value Ref Range Status   Specimen Description BLOOD LEFT ANTECUBITAL  Final  Special Requests   Final    BOTTLES DRAWN AEROBIC AND ANAEROBIC Blood Culture adequate volume   Culture   Final    NO GROWTH < 24 HOURS Performed at [facility], [address] [place], [place] [address]    Report Status PENDING  Incomplete  Resp panel by RT-PCR (RSV, Flu A&B, Covid) Anterior Nasal Swab     Status: None   Collection Time: 04/11/22 12:53 PM   Specimen: Anterior Nasal Swab  Result Value Ref Range Status   SARS Coronavirus 2 by RT PCR NEGATIVE NEGATIVE Final    Comment: (NOTE) SARS-CoV-2 target nucleic acids are NOT DETECTED.  The SARS-CoV-2 RNA is generally detectable in upper respiratory specimens during the acute phase of infection. The lowest concentration of SARS-CoV-2 viral copies this assay can detect is 138 copies/mL. A negative result does not preclude SARS-Cov-2 infection and should not be used as the sole basis for treatment or other patient management decisions. A negative result may occur with  improper specimen collection/handling, submission of specimen other than  nasopharyngeal swab, presence of viral mutation(s) within the areas targeted by this assay, and inadequate number of viral copies(<138 copies/mL). A negative result must be combined with clinical observations, patient history, and epidemiological information. The expected result is Negative.  Fact Sheet for Patients:  EntrepreneurPulse.com.au  Fact Sheet for Healthcare Providers:  IncredibleEmployment.be  This test is no t yet approved or cleared by the [place] FDA and  has been authorized for detection and/or diagnosis of SARS-CoV-2 by FDA under an Emergency Use Authorization (EUA). This EUA will remain  in effect (meaning this test can be used) for the duration of the COVID-19 declaration under Section 564(b)(1) of the Act, 21 U.S.C.section 360bbb-3(b)(1), unless the authorization is terminated  or revoked sooner.       Influenza A by PCR NEGATIVE NEGATIVE Final   Influenza B by PCR NEGATIVE NEGATIVE Final    Comment: (NOTE) The Xpert Xpress SARS-CoV-2/FLU/RSV plus assay is intended as an aid in the diagnosis of influenza from Nasopharyngeal swab specimens and should not be used as a sole basis for treatment. Nasal washings and aspirates are unacceptable for Xpert Xpress SARS-CoV-2/FLU/RSV testing.  Fact Sheet for Patients: EntrepreneurPulse.com.au  Fact Sheet for Healthcare Providers: IncredibleEmployment.be  This test is not yet approved or cleared by the [place] FDA and has been authorized for detection and/or diagnosis of SARS-CoV-2 by FDA under an Emergency Use Authorization (EUA). This EUA will remain in effect (meaning this test can be used) for the duration of the COVID-19 declaration under Section 564(b)(1) of the Act, 21 U.S.C. section 360bbb-3(b)(1), unless the authorization is terminated or revoked.     Resp Syncytial Virus by PCR NEGATIVE NEGATIVE Final    Comment:  (NOTE) Fact Sheet for Patients: EntrepreneurPulse.com.au  Fact Sheet for Healthcare Providers: IncredibleEmployment.be  This test is not yet approved or cleared by the [place] FDA and has been authorized for detection and/or diagnosis of SARS-CoV-2 by FDA under an Emergency Use Authorization (EUA). This EUA will remain in effect (meaning this test can be used) for the duration of the COVID-19 declaration under Section 564(b)(1) of the Act, 21 U.S.C. section 360bbb-3(b)(1), unless the authorization is terminated or revoked.  Performed at [facility], [address] [place], [place] [address]          Radiology Studies: CT Renal Stone Study  Result Date: 04/11/2022 CLINICAL DATA:  Flank pain. EXAM: CT ABDOMEN AND PELVIS WITHOUT CONTRAST  TECHNIQUE: Multidetector CT imaging of the abdomen and pelvis was performed following the standard protocol without IV contrast. RADIATION DOSE REDUCTION: This exam was performed according to the departmental dose-optimization program which includes automated exposure control, adjustment of the mA and/or kV according to patient size and/or use of iterative reconstruction technique. COMPARISON:  December 15, 2019. FINDINGS: Lower chest: No acute abnormality. Hepatobiliary: No focal liver abnormality is seen. Status post cholecystectomy. No biliary dilatation. Pancreas: Unremarkable. No pancreatic ductal dilatation or surrounding inflammatory changes. Spleen: Normal in size without focal abnormality. Adrenals/Urinary Tract: Adrenal glands appear normal. Small exophytic cyst is seen involving left kidney for which no further follow-up is required. No hydronephrosis or renal obstruction is noted. No renal or ureteral calculi are noted. Urinary bladder is decompressed. Stomach/Bowel: The stomach is unremarkable. There is no evidence of bowel obstruction or inflammation. Postsurgical changes are seen involving  sigmoid colon. Vascular/Lymphatic: Aortic atherosclerosis. No enlarged abdominal or pelvic lymph nodes. Reproductive: Status post hysterectomy. No adnexal masses. Other: No abdominal wall hernia or abnormality. No abdominopelvic ascites. Musculoskeletal: Old L1 compression fracture is noted. No acute osseous abnormality is noted. IMPRESSION: No acute abnormality seen in the abdomen or pelvis. Aortic Atherosclerosis (ICD10-I70.0). Electronically Signed   By: Marijo Conception M.D.   On: 04/11/2022 14:27   CT HEAD WO CONTRAST (5MM)  Result Date: 04/11/2022 CLINICAL DATA:  Altered mental status EXAM: CT HEAD WITHOUT CONTRAST TECHNIQUE: Contiguous axial images were obtained from the base of the skull through the vertex without intravenous contrast. RADIATION DOSE REDUCTION: This exam was performed according to the departmental dose-optimization program which includes automated exposure control, adjustment of the mA and/or kV according to patient size and/or use of iterative reconstruction technique. COMPARISON:  CT head 03/06/2018 FINDINGS: Brain: There is no acute intracranial hemorrhage, extra-axial fluid collection, or acute infarct Background parenchymal volume loss with prominence of the ventricular system and extra-axial CSF spaces is unchanged. The ventricles are stable in size. Multifocal cortical encephalomalacia in both cerebral hemispheres, most notably in the left parietal and occipital lobes, is unchanged. Additional patchy hypodensity in the remainder of the white matter likely reflecting sequela of underlying chronic small-vessel ischemic change is also stable. The pituitary and suprasellar region are normal. There is no mass lesion there is no mass effect or midline shift. Vascular: There is calcification of the bilateral carotid siphons. Skull: Normal. Negative for fracture or focal lesion. Sinuses/Orbits: The imaged paranasal sinuses are clear. Bilateral lens implants are in place. The globes and  orbits are otherwise unremarkable. Other: None. IMPRESSION: Stable noncontrast head CT with no acute intracranial pathology. Electronically Signed   By: Valetta Mole M.D.   On: 04/11/2022 14:04   DG Chest Port 1 View  Result Date: 04/11/2022 CLINICAL DATA:  Altered mental status. EXAM: PORTABLE CHEST 1 VIEW COMPARISON:  Chest radiograph 03/06/2022 FINDINGS: The left chest wall cardiac device and associated leads are stable. The cardiomediastinal silhouette is stable The lungs are clear, with no focal consolidation or pulmonary edema. There is no pleural effusion or pneumothorax There is no acute osseous abnormality. IMPRESSION: Stable chest with no radiographic evidence of acute cardiopulmonary process. Electronically Signed   By: Valetta Mole M.D.   On: 04/11/2022 13:55        Scheduled Meds:  allopurinol  50 mg Oral Daily   atorvastatin  80 mg Oral QHS   clopidogrel  75 mg Oral Daily   cyanocobalamin  1,000 mcg Oral BID   divalproex  250 mg  Oral Q12H   donepezil  10 mg Oral QHS   famotidine  20 mg Oral Daily   heparin  5,000 Units Subcutaneous Q8H   levothyroxine  100 mcg Oral QAC breakfast   memantine  10 mg Oral BID   metoprolol succinate  25 mg Oral QHS   mirtazapine  7.5 mg Oral BID   mometasone-formoterol  2 puff Inhalation BID   sodium zirconium cyclosilicate  10 g Oral BID   Continuous Infusions:  cefTRIAXone (ROCEPHIN)  IV Stopped (04/11/22 1626)   dextrose 100 mL/hr at 04/12/22 0500     LOS: 1 day   Time spent= 35 mins    Marlisa Caridi Arsenio Loader, MD [facility]  If 7PM-7AM, please contact night-coverage  04/12/2022, 8:12 AM

## 2022-04-12 NOTE — Consult Note (Signed)
Neurology Consultation Reason for Consult: AMS Requesting Physician: Amy Cox  CC: Altered mental status   History is obtained from: Chart review and husband  HPI: Stacy Grimes is a 72 y.o. female with a past medical history significant for vascular dementia with multiple prior strokes, complicated by poststroke epilepsy (focal right gaze deviation with secondary generalization), heart failure with reduced EF s/p ICD, hypothyroidism, hypertension, obstructive sleep apnea, abdominal aortic aneurysm, right hip fracture in September 2023  She is cared for primarily by her husband at home, bedbound, incontinent, but oriented to self and able to converse a bit, is able to feed herself.  Has been notes that she has had fairly poor appetite since at least December.  Review of notes indicates she has been sleeping a lot.  In this setting has been decided to stop her Remeron due to concern it was causing her somnolence (she was taking it 7.5 mg twice daily, last took it on 3/3).  He notes her hydroxyzine has been scheduled since her hospitalization in February, he had been giving it to her in the morning.  He notes that her gabapentin intake is truly as needed and can vary anywhere from no doses to up to 2 doses a day.  He endorses adherence to Depakote 250 twice daily  EMS was initially called out to the home on 3/4 due to altered mental status but the patient refused transport was agitated and combative.  Family elected to monitor the patient at home for improvement but due to failure to improve presented on 3/6 where patient was found to have significant acute kidney injury, hyperkalemia, and urine concerning for AKI with some deranged liver function tests as well and elevated lactate  Seizure history - Semiology: Right gaze deviation, secondary generalization - Prodome: Unclear - Post-spell: Lethargy, confusion - Triggers: Missed medications - Frequency: Onset December 2023  Prior antiseizure  medications Keppra discontinued for concern that it was causing diarrhea  Risk factors:  Multiple prior cortical strokes Remainder of resected history not elicited this encounter  Premorbid modified rankin scale:      5 - Severe disability. Requires constant nursing care and attention, bedridden, incontinent.  ROS:  Unable to obtain due to altered mental status.   Past Medical History:  Diagnosis Date   AAA (abdominal aortic aneurysm) (HCC)    Anginal pain ([place])    Anxiety    Arthritis    Automatic implantable cardioverter-defibrillator in situ    Biventricular ICD  [employer]    a.  2011 s/p SJM CRT-D; b. 09/2016 gen change: SJM BiV AICD (ser# [id number]).   COPD (chronic obstructive pulmonary disease) (HCC)    Deafness    left ear   Dementia ([place])    Depression    Dizziness    Fracture    history of spinal fracture   GERD (gastroesophageal reflux disease)    HFrEF (heart failure with reduced ejection fraction) ([place])    a. 2010 Cath: nonobs dzs, EF 15-20%; b. 09/2017 Echo: EF suboptimal. EF low nl; c. 04/2018 Echo: EF 40-45%; d. 10/2021 Echo: EF 40-45%, glob HK, mildly red RV fxn, sev elev PASP, triv MR, mild-mod TR.   Hypertension    Hypothyroidism    Non-ischemic cardiomyopathy ([place])    a. 2010 Cath: nonobs dzs, EF 15-20%; b. 2011 s/p SJM CRT-D; c. 09/2016 s/p Gen change; d. 09/2017 Echo: EF suboptimal. EF low nl; e. 04/2018 Echo: EF 40-45%; f. 10/2021 Echo: EF 40-45%, glob HK, mildly red  RV fxn, sev elev PASP, triv MR, mild-mod TR.   Oxygen dependent    Palpitations    Sleep apnea    Past Surgical History:  Procedure Laterality Date   ABDOMINAL HYSTERECTOMY     BIV ICD GENERATOR CHANGEOUT N/A 09/17/2016   Procedure: BiV ICD Generator Changeout;  Surgeon: Deboraha Sprang, MD;  Location: [facility];  Service: Cardiovascular;  Laterality: N/A;   BLADDER SURGERY     CARDIAC CATHETERIZATION     CARDIAC DEFIBRILLATOR PLACEMENT  4 yrs ago   pacemaker   CATARACT EXTRACTION  W/PHACO Left 09/01/2014   Procedure: CATARACT EXTRACTION PHACO AND INTRAOCULAR LENS PLACEMENT ([place]);  Surgeon: Lyla Glassing, MD;  Location: [facility];  Service: Ophthalmology;  Laterality: Left;  [place]: 1:12.1    CATARACT EXTRACTION W/PHACO Right 09/29/2014   Procedure: CATARACT EXTRACTION PHACO AND INTRAOCULAR LENS PLACEMENT (IOC);  Surgeon: Lyla Glassing, MD;  Location: [facility];  Service: Ophthalmology;  Laterality: Right;  [place]: 00:52.7    CHOLECYSTECTOMY     COLON SURGERY     COLONOSCOPY     COLONOSCOPY N/A 08/31/2012   Procedure: COLONOSCOPY;  Surgeon: Inda Castle, MD;  Location: [facility];  Service: Endoscopy;  Laterality: N/A;   ESOPHAGOGASTRODUODENOSCOPY N/A 07/06/2012   Procedure: ESOPHAGOGASTRODUODENOSCOPY (EGD);  Surgeon: Lafayette Dragon, MD;  Location: [facility];  Service: Endoscopy;  Laterality: N/A;   INTRAMEDULLARY (IM) NAIL INTERTROCHANTERIC Right 10/23/2021   Procedure: INTRAMEDULLARY (IM) NAIL INTERTROCHANTERIC;  Surgeon: Corky Mull, MD;  Location: [facility];  Service: Orthopedics;  Laterality: Right;   KNEE SURGERY Right    MIDDLE EAR SURGERY     SAVORY DILATION N/A 07/06/2012   Procedure: SAVORY DILATION;  Surgeon: Lafayette Dragon, MD;  Location: [facility];  Service: Endoscopy;  Laterality: N/A;   TEE WITHOUT CARDIOVERSION N/A 06/23/2020   Procedure: TRANSESOPHAGEAL ECHOCARDIOGRAM (TEE);  Surgeon: Minna Merritts, MD;  Location: [facility];  Service: Cardiovascular;  Laterality: N/A;   WRIST SURGERY Right    Current Outpatient Medications  Medication Instructions   acetaminophen (TYLENOL) 650 mg, Oral, Nightly   albuterol (ACCUNEB) 0.63 MG/3ML nebulizer solution 1 ampule, Nebulization, Every 6 hours PRN   albuterol (PROVENTIL HFA;VENTOLIN HFA) 108 (90 Base) MCG/ACT inhaler 2 puffs, Inhalation, Every 6 hours PRN   allopurinol (ZYLOPRIM) 100 mg, Oral, Daily   atorvastatin (LIPITOR) 80 mg, Oral, Daily   budesonide-formoterol (SYMBICORT) 160-4.5 MCG/ACT inhaler 2  puffs, Inhalation, 2 times daily   clopidogrel (PLAVIX) 75 mg, Oral, Daily   cyanocobalamin (VITAMIN B12) 1,000 mcg, Oral, 2 times daily   digoxin (LANOXIN) 0.125 mg, Oral, Daily   divalproex (DEPAKOTE SPRINKLE) 250 mg, Oral, Every 12 hours   donepezil (ARICEPT) 10 mg, Oral, Daily at bedtime   famotidine (PEPCID) 20 mg, Oral, Daily   furosemide (LASIX) 20 MG tablet Take 1 tablet (20 mg total) by mouth every other day. May take an additional tablet AS NEEDED for weight gain of 3 lbs overnight or 5 lbs in one week. Do not exceed more than 3 additional doses.   gabapentin (NEURONTIN) 300 mg, Oral, 2 times daily PRN   hydrOXYzine (VISTARIL) 25 mg, Oral, At bedtime PRN   levothyroxine (SYNTHROID) 100 mcg, Oral, Daily   losartan (COZAAR) 12.5 mg, Oral, Daily at bedtime   memantine (NAMENDA) 10 mg, Oral, 2 times daily   metoprolol succinate (TOPROL-XL) 25 mg, Oral, Daily at bedtime   mirtazapine (REMERON) 7.5 mg, Oral, 2 times daily   ondansetron (ZOFRAN) 4 mg,  Oral, Every 8 hours PRN   polyvinyl alcohol (LIQUIFILM TEARS) 1.4 % ophthalmic solution 1 drop, Both Eyes, As needed   potassium chloride SA (KLOR-CON M) 20 MEQ tablet 20 mEq, Oral, Daily   tiotropium (SPIRIVA) 18 mcg, Inhalation, Daily PRN   venlafaxine XR (EFFEXOR-XR) 75 mg, 2 times daily     Family History  Problem Relation Age of Onset   Hypertension Mother    Breast cancer Mother    Stroke Mother    Heart disease Father    Heart attack Sister    Hypertension Brother    Heart attack Sister    Colon cancer Brother 44    Social History:  reports that she quit smoking about 20 years ago. Her smoking use included cigarettes. She has a 60.00 pack-year smoking history. She has never used smokeless tobacco. She reports that she does not drink alcohol and does not use drugs.   Exam: Current vital signs: BP 98/66 (BP Location: Right Arm)   Pulse 77   Temp 97.8 F (36.6 C) (Axillary)   Resp 18   Ht '5\' 3"'$  (1.6 m)   Wt 59 kg    SpO2 96%   BMI 23.03 kg/m  Vital signs in last 24 hours: Temp:  [97.5 F (36.4 C)-97.8 F (36.6 C)] 97.8 F (36.6 C) (03/08 0752) Pulse Rate:  [72-81] 77 (03/08 0752) Resp:  [14-22] 18 (03/08 0752) BP: (88-134)/(41-100) 98/66 (03/08 0752) SpO2:  [95 %-100 %] 96 % (03/08 0752) Weight:  [59 kg-62 kg] 59 kg (03/07 1832)   Physical Exam  Constitutional: Appears chronically ill, no acute distress.  Psych: Affect appropriate to situation, pleasant and mildly confused at the time of my evaluation Eyes: Slight left eye some conjunctival hemorrhage HENT: No oropharyngeal obstruction.  Mild dysphagia noted when attempting to swallow pills crushed with pudding with nursing MSK: no major joint deformities.  Diffuse atrophy of bilateral legs Cardiovascular: Perfusing extremities well Respiratory: Effort normal, non-labored breathing with nasal cannula in place GI: Soft.  No distension. There is no tenderness.  Skin: Warm dry and intact visible skin  Neuro: Mental Status: Patient is awake, alert, oriented to person only (baseline).  At times follows some simple commands but at times only mimics Unable to give any significant history.  Language exam limited by attention/concentration but able to repeat and name some simple things Cranial Nerves: II: Pupils are equal, round, and reactive to light.   III,IV, VI: Tracks examiner bilaterally V: Facial sensation is symmetric to light eyelash brush VII: Facial movement is symmetric.  VIII: hearing is reported deaf in the left ear at baseline, tends to turn her right ear to examiner X: Uvula elevates symmetrically XI: Shoulder shrug is symmetric antigravity. XII: tongue is midline without atrophy or fasciculations.  Sensory/motor: Tone is normal. Bulk is normal.  Does not participate in confrontational strength testing but uses bilateral upper extremities grossly equally.  Withdraws to light tickle of the bilateral lower extremities equally Gait:   Nonambulatory at baseline   I have reviewed labs in epic and the results pertinent to this consultation are: Initial potassium 6.3, improving with resuscitation, additionally also hyponatremic, hypochloremic, hypoglycemic to 66, Uremic to 54, AKI with creatinine of 3.26 (baseline 0.8), elevated AST 45, alk phos 151, low albumin at 2.7, digoxin level 3.2  UA with positive leukocyte esterase, negative nitrates, rare bacteria, proteinuria, no squamous cells, positive red blood cells  I have reviewed the images obtained: Head CT personally reviewed, agree with radiology  no acute intracranial process   Impression:  Multifocal encephalopathy in the setting of multiple metabolic derangements including uremia, hypernatremia, hyperchloremia, hyperkalemia, acute kidney injury, elevated liver function tests, elevated lactate, UTI, elevated digoxin level.  Potentially some component of polypharmacy given she is on multiple sedating medications including Depakote for seizure control but also gabapentin for pain, Remeron (which was discontinued by family recently due to concern for oversedation, was being used for behavior issues), hydroxyzine which she has been receiving in the morning at home.  I have very low concern for breakthrough seizures given none were reported by husband and he reports she has been receiving her Depakote. At the time of my examination this morning she appears to be at her baseline.  Recommendations: -Continue Depakote 250 mg every 12 hours, trough level this evening  -No indication for EEG at this time as she seems at her mental status baseline and husband reports no seizure activity -Caution with gabapentin in the setting of her AKI, recommend reducing dose until her kidney injury resolves (I have not adjusted this medication) -Remeron changed to nightly only, consider discontinuing if family requests (they had not been giving the patient this medication since 3/4; withdrawal  symptoms may be contributing to disrupted sleep-wake cycle and agitation) -Family has been giving hydroxyzine in the morning, if this medication is continued recommend documenting well for them that it should be given at night, noting that it can worsen cognitive impairment in patients with dementia (currently ordered nightly as needed)  Lesleigh Noe MD-PhD [facility] [phone] [facility] coverage for [facility] is from 8 AM to 4 AM in-house and 4 PM to 8 PM by telephone/video. 8 PM to 8 AM emergent questions or overnight urgent questions should be addressed to Teleneurology On-call or [facility] neurohospitalist; contact information can be found on AMION

## 2022-04-12 NOTE — Progress Notes (Signed)
No urine output noted from 7a-6p today. Bladder scan is 139 ml. MD notified, awaiting order.

## 2022-04-12 NOTE — Progress Notes (Signed)
       CROSS COVER NOTE  NAME: Stacy Grimes MRN: [id number] DOB : [birth year]    HPI/Events of Note   Report:from nurse via chat worsening hyperkalemia from 5/4 to 5.9, similar to previous  On review of chart: Sodium remains about the same 148-149 Creatinine mildly improved  from 3.72 to 3.26 Serum glucose 66 Calcium 8.8    Assessment and  Interventions   Assessment:  Plan: Restart D5W at 100 Mag level added to labs Hyperkalemia protocol insulin.dextrose + lokelma + calcium  gluconate Continue monitoring K levels every 4 h Cotinue monitoring on tele Frequent cbg monitoring      Kathlene Cote NP [facility]

## 2022-04-12 NOTE — TOC Initial Note (Signed)
Transition of Care [facility]) - Initial/Assessment Note    Patient Details  Name: Stacy Grimes MRN: [id number] Date of Birth: Feb 25, 1950  [facility]) CM/SW Contact:    Laurena Slimmer, RN Phone Number: 04/12/2022, 1:28 PM  Clinical Narrative:                   Admitted from: Home with spouse  [id number] [place] [facility]  Current home health/prior home health/DME: [facility] Patient is [facility].  Merleen Nicely from [facility] notified.         Patient Goals and CMS Choice            Expected Discharge Plan and Services                                              Prior Living Arrangements/Services                       Activities of Daily Living      Permission Sought/Granted                  Emotional Assessment              Admission diagnosis:  Hyperkalemia [E87.5] Altered mental status [R41.82] AKI (acute kidney injury) ([place]) [N17.9] Altered mental status, unspecified altered mental status type [R41.82] Patient Active Problem List   Diagnosis Date Noted   Altered mental status 04/11/2022   Hyperkalemia 04/11/2022   Nausea 03/10/2022   NSVT (nonsustained ventricular tachycardia) ([place]) 03/08/2022   Breakthrough seizure ([place]) 03/07/2022   Hypomagnesemia 03/07/2022   Hypocalcemia 03/07/2022   Chronic respiratory failure with hypoxia ([place]) 123456   Acute metabolic encephalopathy 123456   Hypothyroidism 03/07/2022   AAA (abdominal aortic aneurysm) ([place]) 03/07/2022   Depression 03/07/2022   Diarrhea 03/07/2022   ICD (implantable cardioverter-defibrillator) in place 03/07/2022   Protein calorie malnutrition ([place]) 01/11/2022   Seizures ([place]) 01/08/2022   Acute on chronic respiratory failure with hypoxia and hypercapnia ([place]) 01/08/2022   Hypokalemia 01/08/2022   Wide-complex tachycardia 01/08/2022   Status epilepticus ([place]) 01/08/2022   Femur fracture ([place]) 12/10/2021   Fall  12/10/2021   Dementia ([place]) 12/10/2021   COVID-19 virus infection 12/10/2021   AKI (acute kidney injury) ([place]) 12/10/2021   Shock circulatory ([place])    Hip fracture, unspecified laterality, closed, initial encounter ([place]) 10/23/2021   Hip fracture ([place]) 10/23/2021   CHF (congestive heart failure) ([place]) 10/23/2021   Orthostatic hypotension 08/06/2021   Chronic congestive heart failure (HCC)    Acute ischemic stroke ([place]) 06/21/2020   Chronic obstructive pulmonary disease, unspecified COPD type ([place]) 06/20/2020   Type 2 diabetes mellitus with hyperglycemia, unspecified whether long term insulin use ([place]) 06/20/2020   Left knee pain 12/16/2019   Elevated troponin I level 12/15/2019   Dehydration 04/12/2018   Syncope 04/09/2018   Benign neoplasm of colon 08/31/2012   Diverticulosis of colon (without mention of hemorrhage) 08/31/2012   Generalized weakness 08/10/2012   Personal history of colonic polyps 07/02/2012   Dysphagia, unspecified(787.20) 07/02/2012   Unspecified constipation 07/02/2012   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 07/09/2010   Hypertension 03/24/2009   SLEEP APNEA, OBSTRUCTIVE 03/23/2009   Dilated cardiomyopathy ([place]) 0000000   Chronic systolic CHF (congestive heart failure) ([place]) 03/23/2009   PALPITATIONS 03/23/2009   MURMUR 03/23/2009   COUGH  03/23/2009   PCP:  Juluis Pitch, MD Pharmacy:   [facility] [place] - [address] [place] [address] Phone: [phone] Fax: [place] [address] [address] Phone: [phone] Fax: [phone]     Social Determinants of Health (SDOH) Social History: [place]: No Food Insecurity (03/07/2022)  Housing: Low Risk  (03/07/2022)  Transportation Needs: No Transportation Needs (03/07/2022)  Utilities: Not At Risk (03/07/2022)  Depression (PHQ2-9): Low  Risk  (09/01/2019)  Tobacco Use: Medium Risk (04/11/2022)   SDOH Interventions:     Readmission Risk Interventions    04/12/2022    1:27 PM 03/08/2022    1:45 PM  Readmission Risk Prevention Plan  Transportation Screening Complete Complete  Medication Review Press photographer) Complete Complete  PCP or Specialist appointment within 3-5 days of discharge Complete Complete  HRI or Home Care Consult Complete Complete  SW Recovery Care/Counseling Consult Complete Complete  Palliative Care Screening Not Complete   [place] Not Complete Complete

## 2022-04-12 NOTE — Evaluation (Addendum)
Clinical/Bedside Swallow Evaluation Patient Details  Name: Stacy Grimes MRN: [id number] Date of Birth: 06/08/1950  Today's Date: 04/12/2022 Time: SLP Start Time (ACUTE ONLY): 0910 SLP Stop Time (ACUTE ONLY): 1000 SLP Time Calculation (min) (ACUTE ONLY): 50 min  Past Medical History:  Past Medical History:  Diagnosis Date   AAA (abdominal aortic aneurysm) (HCC)    Anginal pain (HCC)    Anxiety    Arthritis    Automatic implantable cardioverter-defibrillator in situ    Biventricular ICD  [place]    a.  2011 s/p SJM CRT-D; b. 09/2016 gen change: SJM BiV AICD (ser# [id number]).   COPD (chronic obstructive pulmonary disease) (HCC)    Deafness    left ear   Dementia ([place])    Depression    Dizziness    Fracture    history of spinal fracture   GERD (gastroesophageal reflux disease)    HFrEF (heart failure with reduced ejection fraction) ([place])    a. 2010 Cath: nonobs dzs, EF 15-20%; b. 09/2017 Echo: EF suboptimal. EF low nl; c. 04/2018 Echo: EF 40-45%; d. 10/2021 Echo: EF 40-45%, glob HK, mildly red RV fxn, sev elev PASP, triv MR, mild-mod TR.   Hypertension    Hypothyroidism    Non-ischemic cardiomyopathy ([place])    a. 2010 Cath: nonobs dzs, EF 15-20%; b. 2011 s/p SJM CRT-D; c. 09/2016 s/p Gen change; d. 09/2017 Echo: EF suboptimal. EF low nl; e. 04/2018 Echo: EF 40-45%; f. 10/2021 Echo: EF 40-45%, glob HK, mildly red RV fxn, sev elev PASP, triv MR, mild-mod TR.   Oxygen dependent    Palpitations    Sleep apnea    Past Surgical History:  Past Surgical History:  Procedure Laterality Date   ABDOMINAL HYSTERECTOMY     BIV ICD GENERATOR CHANGEOUT N/A 09/17/2016   Procedure: BiV ICD Generator Changeout;  Surgeon: Deboraha Sprang, MD;  Location: [facility];  Service: Cardiovascular;  Laterality: N/A;   BLADDER SURGERY     CARDIAC CATHETERIZATION     CARDIAC DEFIBRILLATOR PLACEMENT  4 yrs ago   pacemaker   CATARACT EXTRACTION W/PHACO Left 09/01/2014   Procedure: CATARACT EXTRACTION PHACO  AND INTRAOCULAR LENS PLACEMENT ([place]);  Surgeon: Lyla Glassing, MD;  Location: [facility];  Service: Ophthalmology;  Laterality: Left;  [place]: 1:12.1    CATARACT EXTRACTION W/PHACO Right 09/29/2014   Procedure: CATARACT EXTRACTION PHACO AND INTRAOCULAR LENS PLACEMENT (I[facility]);  Surgeon: Lyla Glassing, MD;  Location: [facility];  Service: Ophthalmology;  Laterality: Right;  [place]: 00:52.7    CHOLECYSTECTOMY     COLON SURGERY     COLONOSCOPY     COLONOSCOPY N/A 08/31/2012   Procedure: COLONOSCOPY;  Surgeon: Inda Castle, MD;  Location: [facility];  Service: Endoscopy;  Laterality: N/A;   ESOPHAGOGASTRODUODENOSCOPY N/A 07/06/2012   Procedure: ESOPHAGOGASTRODUODENOSCOPY (EGD);  Surgeon: Lafayette Dragon, MD;  Location: [facility];  Service: Endoscopy;  Laterality: N/A;   INTRAMEDULLARY (IM) NAIL INTERTROCHANTERIC Right 10/23/2021   Procedure: INTRAMEDULLARY (IM) NAIL INTERTROCHANTERIC;  Surgeon: Corky Mull, MD;  Location: [facility];  Service: Orthopedics;  Laterality: Right;   KNEE SURGERY Right    MIDDLE EAR SURGERY     SAVORY DILATION N/A 07/06/2012   Procedure: SAVORY DILATION;  Surgeon: Lafayette Dragon, MD;  Location: [facility];  Service: Endoscopy;  Laterality: N/A;   TEE WITHOUT CARDIOVERSION N/A 06/23/2020   Procedure: TRANSESOPHAGEAL ECHOCARDIOGRAM (TEE);  Surgeon: Minna Merritts, MD;  Location: [facility];  Service: Cardiovascular;  Laterality:  N/A;   WRIST SURGERY Right    HPI:  Pt is a 72 year old female with history of Dementia, GERD, stroke, COPD on chronic 4 L nasal cannula, cardiomyopathy, CHF, Seizures, Protein calorie malnutrition, who presents to the emergency department for chief concerns of altered mental status for the 3 days. Per chart, Husband reports that she has not been eating well since January 08, 2022. Per Husband, since Sunday morning, patient was more altered, a lot of twitching, and difficulty speaking. He reports he tries to give patient water and food every 2-3 hours and  patient just takes one or two sips and then stops. He denies report of vomiting, diarrhea, fever. He states her urine 'can knock you out from the smell' over the last few days.    Assessment / Plan / Recommendation  Clinical Impression    Pt seen for BSE today. She was awake, Confused -- Baseline Dementia per chart. She required min-mod verbal/tactile cues to follow through w/ tasks. She seemed uncomfortable stating "my tailbone"; NSG informed. Min HOH also? Currently encephalopathy, AKI per MD.  Per chart notes, BSE: 2022(mech soft, thins); BSE/MBSS 01/2022 w/ last diet of Dysphagia 1 w/ Nectar liquids d/t Confusion and oropharyngeal phase dysphagia while hospitalized then. In 03/2022, Husband stated she was "not drinking those thickened liquids", per report. Unsure of current diet at home - Husband not present this evaluation.  On [place] O2 support; afebrile; WBC WNL.    No Family present -- unknown baseline w/ oral intake at home. Per Husband's report last admit in 03/2022, pt has been drinking [place] at home since discharge from last hospitalization at [facility] in Dec. 2023. She drinks "water and sodas", per Husband. He denied pt having any coughing/choking and "she drinks a lot of it".    At this evaluation, pt appears to present w/ grossly functional pharyngeal phase swallowing w/ No immediate, overt clinical s/s of aspiration nor overt neuromuscular deficits noted. Pt consumed po trials w/ No immediate, overt s/s of aspiration during/post po trials. However, pt's oral phase management of boluses of increased texture appeared hampered by lacking Bottom Dentition and suspect in setting of Cognitive decline/Dementia.  Pt presents w/ Confusion(Bilat. Mitts on currently) w/ a Baseline of Dementia. ANY Cognitive decline can impact awareness/timing of swallowing, oral clearing, and overall oral intake. Pt is missing Bottom Dentition/Denture plate which impacts effective mastication of solids. She is currently  dependent on being fed. All of these factors can increase risk for aspiration, dysphagia as well as decreased oral intake overall. Husband has stated she has not been eating "well" since 01/2022 per chart note. Per chart, pt is bedbound, oriented to self only, and incontinent. She has been sleeping a lot. Noted [facility] was d/c as an outpatient due to somnolence.    During po trials w/ FULL SUPERVISION and MONITORING SUPPORT, pt consumed all consistencies w/ no overt coughing, decline in vocal quality, or change in respiratory presentation during/post trials. O2 sats remained in upper 90s. Oral phase appeared [facility] w/ trials of liquids and puree for timely bolus management and control of bolus propulsion for A-P transfer for swallowing. Increased Time taken for mashing/gumming of increased textured trials; moistening the trials appeared to aid oral phase management. Oral clearing achieved w/ all trial consistencies w/ TIME, especially for the increased textured trials. Pt declined po's intermittently but encouragement was given to complete trials for the evaluation.  OM Exam was cursory but appeared grossly [facility] w/ no unilateral lingual/labial weakness noted. Lingual  extension was symmetrical and strength WFL. Speech Clear though pt often muttered. Pt required feeding support d/t Bilat. Mitts and Confusion.    Recommend a Dysphagia level 2 w/ thin liquids -- carefully monitor any straw use pinching to limit boluses as needed, and pt should help to Hold Cup when drinking if able. Recommend aspiration precautions- reduce distractions, ensure oral clearing, sitting fully upright for oral intake. Support w/ feeding; FULL Supervision at meals.  Pills WHOLE vs CRUSHED in Puree for safer, easier swallowing. REFLUX precautions.    Education given on Pills in Puree; food consistencies and diet; aspiration and reflux precautions to pt and NSG. Precautions posted in room/chart. Unsure of pt's level of comprehension in  setting of Dementia.  ST services recommends pt remain on the diet consistency for best support of oral intake safely -- noting poor oral intake since 01/2022(per husband's report). When pt is able to maintain improved medical and Cognitive status', recommend ST f/u for tx/diet upgrade assessment at that venue of care.   Recommend f/u by Palliative Care for [place] discussion re: oral intake/swallowing/dysphagia in setting of Dementia. MD/NSG updated, agreed. MD to reconsult if any new needs while admitted.  SLP Visit Diagnosis: Dysphagia, oropharyngeal phase (R13.12) (primary oral phase; Baseline Dementia)    Aspiration Risk  Mild aspiration risk;Risk for inadequate nutrition/hydration    Diet Recommendation   a Dysphagia level 2 w/ thin liquids -- via Cup or carefully monitor any straw use pinching to limit boluses as needed, and pt should help to Hold Cup when drinking if able. Recommend aspiration precautions- reduce distractions, sitting fully upright for oral intake, ensure oral clearing. Support w/ feeding; FULL Supervision at meals. REFLUX precautions.  Medication Administration: Crushed with puree    Other  Recommendations Recommended Consults:  (Palliative Care consult) Oral Care Recommendations: Oral care BID;Oral care before and after PO;Staff/trained caregiver to provide oral care ([place] care)    Recommendations for follow up therapy are one component of a multi-disciplinary discharge planning process, led by the attending physician.  Recommendations may be updated based on patient status, additional functional criteria and insurance authorization.  Follow up Recommendations Follow physician's recommendations for discharge plan and follow up therapies      Assistance Recommended at Discharge  Full supervision in setting of Dementia  Functional Status Assessment Patient has had a recent decline in their functional status and/or demonstrates limited ability to make significant  improvements in function in a reasonable and predictable amount of time  Frequency and Duration  (n/a)   (n/a)       Prognosis Prognosis for improved oropharyngeal function: Fair Barriers to Reach Goals: Cognitive deficits;Language deficits;Time post onset;Severity of deficits Barriers/Prognosis Comment: baseline Dementia; baseline oral phase dysphagia; declined medical status/functioning w/ dependency for feeding/ADLs.      Swallow Study   General Date of Onset: 04/11/22 HPI: Pt is a 72 year old female with history of Dementia, stroke, COPD on chronic 4 L nasal cannula, cardiomyopathy, CHF, Seizures, Protein calorie malnutrition, who presents to the emergency department for chief concerns of altered mental status for the 3 days. Per chart, Husband reports that she has not been eating well since January 08, 2022. Per Husband, since Sunday morning, patient was more altered, a lot of twitching, and difficulty speaking. He reports he tries to give patient water and food every 2-3 hours and patient just takes one or two sips and then stops. He denies report of vomiting, diarrhea, fever. He states her urine 'can knock you out  from the smell' over the last few days. Type of Study: Bedside Swallow Evaluation Previous Swallow Assessment: BSEs 2022, 2023, 2024 Diet Prior to this Study: Dysphagia 3 (mechanical soft);Dysphagia 2 (finely chopped);Thin liquids (Level 0) ("pt was not drinking thickened liquids at home since being at [facility] in 01/2022", per Husband) Temperature Spikes Noted: No (wbc 9.4) Respiratory Status: Nasal cannula (4L) History of Recent Intubation: No Behavior/Cognition: Alert;Cooperative;Pleasant mood;Confused;Distractible;Requires cueing (Dementia baseline) Oral Cavity Assessment: Within Functional Limits Oral Care Completed by SLP: Yes Oral Cavity - Dentition: Dentures, top (no bottom Denture plate/dentition) Vision:  (n/a) Self-Feeding Abilities: Total assist (bialt.  mitts) Patient Positioning: Upright in bed (needed full positioning support) Baseline Vocal Quality: Normal Volitional Cough: Cognitively unable to elicit Volitional Swallow: Unable to elicit    Oral/Motor/Sensory Function Overall Oral Motor/Sensory Function: Within functional limits (grossly - no unilateral lingual weakness)   Ice Chips Ice chips: Within functional limits Presentation: Spoon (fed; 3 trials)   Thin Liquid Thin Liquid: Within functional limits Presentation: Cup;Self Fed;Straw (pinched; 10 trials via each method) Other Comments: full Supervision    Nectar Thick Nectar Thick Liquid: Not tested   Honey Thick Honey Thick Liquid: Not tested   Puree Puree: Within functional limits Presentation: Spoon (fed; 10 trials)   Solid     Solid: Impaired Presentation: Spoon (fed; 5 trials) Oral Phase Impairments: Impaired mastication;Poor awareness of bolus (lengthy) Oral Phase Functional Implications: Prolonged oral transit;Impaired mastication (no bottom dentition) Pharyngeal Phase Impairments:  (none)          Orinda Kenner, MS, CCC-SLP Speech Language Pathologist Rehab Services; [place] [phone] (ascom) Jeanann Balinski 04/12/2022,4:43 PM

## 2022-04-13 DIAGNOSIS — N390 Urinary tract infection, site not specified: Secondary | ICD-10-CM | POA: Diagnosis not present

## 2022-04-13 DIAGNOSIS — D126 Benign neoplasm of colon, unspecified: Secondary | ICD-10-CM

## 2022-04-13 DIAGNOSIS — I5022 Chronic systolic (congestive) heart failure: Secondary | ICD-10-CM

## 2022-04-13 DIAGNOSIS — D649 Anemia, unspecified: Secondary | ICD-10-CM

## 2022-04-13 DIAGNOSIS — E876 Hypokalemia: Secondary | ICD-10-CM

## 2022-04-13 DIAGNOSIS — J449 Chronic obstructive pulmonary disease, unspecified: Secondary | ICD-10-CM

## 2022-04-13 DIAGNOSIS — E039 Hypothyroidism, unspecified: Secondary | ICD-10-CM

## 2022-04-13 DIAGNOSIS — R410 Disorientation, unspecified: Secondary | ICD-10-CM | POA: Diagnosis not present

## 2022-04-13 DIAGNOSIS — G40009 Localization-related (focal) (partial) idiopathic epilepsy and epileptic syndromes with seizures of localized onset, not intractable, without status epilepticus: Secondary | ICD-10-CM | POA: Diagnosis not present

## 2022-04-13 DIAGNOSIS — E440 Moderate protein-calorie malnutrition: Secondary | ICD-10-CM

## 2022-04-13 DIAGNOSIS — D696 Thrombocytopenia, unspecified: Secondary | ICD-10-CM

## 2022-04-13 DIAGNOSIS — N179 Acute kidney failure, unspecified: Secondary | ICD-10-CM | POA: Diagnosis not present

## 2022-04-13 DIAGNOSIS — F32A Depression, unspecified: Secondary | ICD-10-CM

## 2022-04-13 DIAGNOSIS — F0390 Unspecified dementia without behavioral disturbance: Secondary | ICD-10-CM

## 2022-04-13 DIAGNOSIS — Z95810 Presence of automatic (implantable) cardiac defibrillator: Secondary | ICD-10-CM | POA: Diagnosis not present

## 2022-04-13 DIAGNOSIS — E872 Acidosis, unspecified: Secondary | ICD-10-CM

## 2022-04-13 DIAGNOSIS — G40901 Epilepsy, unspecified, not intractable, with status epilepticus: Secondary | ICD-10-CM

## 2022-04-13 LAB — CBC
HCT: 22.5 % — ABNORMAL LOW (ref 36.0–46.0)
Hemoglobin: 6.7 g/dL — ABNORMAL LOW (ref 12.0–15.0)
MCH: 23.9 pg — ABNORMAL LOW (ref 26.0–34.0)
MCHC: 29.8 g/dL — ABNORMAL LOW (ref 30.0–36.0)
MCV: 80.4 fL (ref 80.0–100.0)
Platelets: 64 10*3/uL — ABNORMAL LOW (ref 150–400)
RBC: 2.8 MIL/uL — ABNORMAL LOW (ref 3.87–5.11)
RDW: 20.1 % — ABNORMAL HIGH (ref 11.5–15.5)
WBC: 8.3 10*3/uL (ref 4.0–10.5)
nRBC: 0 % (ref 0.0–0.2)

## 2022-04-13 LAB — VITAMIN D 25 HYDROXY (VIT D DEFICIENCY, FRACTURES): Vit D, 25-Hydroxy: 12.51 ng/mL — ABNORMAL LOW (ref 30–100)

## 2022-04-13 LAB — HEPATIC FUNCTION PANEL
ALT: 14 U/L (ref 0–44)
AST: 32 U/L (ref 15–41)
Albumin: 2 g/dL — ABNORMAL LOW (ref 3.5–5.0)
Alkaline Phosphatase: 102 U/L (ref 38–126)
Bilirubin, Direct: <0.1 mg/dL (ref 0.0–0.2)
Total Bilirubin: 0.3 mg/dL (ref 0.3–1.2)
Total Protein: 5.7 g/dL — ABNORMAL LOW (ref 6.5–8.1)

## 2022-04-13 LAB — BLOOD GAS, VENOUS
Acid-base deficit: 3.4 mmol/L — ABNORMAL HIGH (ref 0.0–2.0)
Bicarbonate: 23.6 mmol/L (ref 20.0–28.0)
O2 Saturation: 55.8 %
Patient temperature: 37
pCO2, Ven: 49 mmHg (ref 44–60)
pH, Ven: 7.29 (ref 7.25–7.43)
pO2, Ven: 39 mmHg (ref 32–45)

## 2022-04-13 LAB — BASIC METABOLIC PANEL
Anion gap: 9 (ref 5–15)
Anion gap: 9 (ref 5–15)
BUN: 44 mg/dL — ABNORMAL HIGH (ref 8–23)
BUN: 44 mg/dL — ABNORMAL HIGH (ref 8–23)
CO2: 23 mmol/L (ref 22–32)
CO2: 22 mmol/L (ref 22–32)
Calcium: 8.2 mg/dL — ABNORMAL LOW (ref 8.9–10.3)
Calcium: 8.2 mg/dL — ABNORMAL LOW (ref 8.9–10.3)
Chloride: 108 mmol/L (ref 98–111)
Chloride: 109 mmol/L (ref 98–111)
Creatinine, Ser: 2.4 mg/dL — ABNORMAL HIGH (ref 0.44–1.00)
Creatinine, Ser: 2.28 mg/dL — ABNORMAL HIGH (ref 0.44–1.00)
GFR, Estimated: 21 mL/min — ABNORMAL LOW (ref >60)
GFR, Estimated: 22 mL/min — ABNORMAL LOW (ref >60)
Glucose, Bld: 74 mg/dL (ref 70–99)
Glucose, Bld: 124 mg/dL — ABNORMAL HIGH (ref 70–99)
Potassium: 3.7 mmol/L (ref 3.5–5.1)
Potassium: 3.4 mmol/L — ABNORMAL LOW (ref 3.5–5.1)
Sodium: 140 mmol/L (ref 135–145)
Sodium: 140 mmol/L (ref 135–145)

## 2022-04-13 LAB — FERRITIN: Ferritin: 390 ng/mL — ABNORMAL HIGH (ref 11–307)

## 2022-04-13 LAB — IRON AND TIBC
Iron: 45 ug/dL (ref 28–170)
Saturation Ratios: 26 % (ref 10.4–31.8)
TIBC: 175 ug/dL — ABNORMAL LOW (ref 250–450)
UIBC: 130 ug/dL

## 2022-04-13 LAB — CBC WITH DIFFERENTIAL/PLATELET
Abs Immature Granulocytes: 0.04 10*3/uL (ref 0.00–0.07)
Basophils Absolute: 0.1 10*3/uL (ref 0.0–0.1)
Basophils Relative: 1 %
Eosinophils Absolute: 0.4 10*3/uL (ref 0.0–0.5)
Eosinophils Relative: 4 %
HCT: 22.6 % — ABNORMAL LOW (ref 36.0–46.0)
Hemoglobin: 6.8 g/dL — ABNORMAL LOW (ref 12.0–15.0)
Immature Granulocytes: 1 %
Lymphocytes Relative: 28 %
Lymphs Abs: 2.3 10*3/uL (ref 0.7–4.0)
MCH: 24.3 pg — ABNORMAL LOW (ref 26.0–34.0)
MCHC: 30.1 g/dL (ref 30.0–36.0)
MCV: 80.7 fL (ref 80.0–100.0)
Monocytes Absolute: 1 10*3/uL (ref 0.1–1.0)
Monocytes Relative: 13 %
Neutro Abs: 4.5 10*3/uL (ref 1.7–7.7)
Neutrophils Relative %: 53 %
Platelets: 65 10*3/uL — ABNORMAL LOW (ref 150–400)
RBC: 2.8 MIL/uL — ABNORMAL LOW (ref 3.87–5.11)
RDW: 20.1 % — ABNORMAL HIGH (ref 11.5–15.5)
WBC: 8.3 10*3/uL (ref 4.0–10.5)
nRBC: 0 % (ref 0.0–0.2)

## 2022-04-13 LAB — RETICULOCYTES
Immature Retic Fract: 9.7 % (ref 2.3–15.9)
RBC.: 2.76 MIL/uL — ABNORMAL LOW (ref 3.87–5.11)
Retic Count, Absolute: 12.7 10*3/uL — ABNORMAL LOW (ref 19.0–186.0)
Retic Ct Pct: 0.5 % (ref 0.4–3.1)

## 2022-04-13 LAB — APTT: aPTT: 40 seconds — ABNORMAL HIGH (ref 24–36)

## 2022-04-13 LAB — MAGNESIUM
Magnesium: 1.4 mg/dL — ABNORMAL LOW (ref 1.7–2.4)
Magnesium: 1.4 mg/dL — ABNORMAL LOW (ref 1.7–2.4)

## 2022-04-13 LAB — CORTISOL: Cortisol, Plasma: 16.8 ug/dL

## 2022-04-13 LAB — T4, FREE: Free T4: 0.97 ng/dL (ref 0.61–1.12)

## 2022-04-13 LAB — FOLATE
Folate: 3 ng/mL — ABNORMAL LOW (ref >5.9)
Folate: 3 ng/mL — ABNORMAL LOW (ref >5.9)

## 2022-04-13 LAB — VITAMIN B12: Vitamin B-12: 7079 pg/mL — ABNORMAL HIGH (ref 180–914)

## 2022-04-13 LAB — GLUCOSE, CAPILLARY
Glucose-Capillary: 108 mg/dL — ABNORMAL HIGH (ref 70–99)
Glucose-Capillary: 111 mg/dL — ABNORMAL HIGH (ref 70–99)
Glucose-Capillary: 125 mg/dL — ABNORMAL HIGH (ref 70–99)
Glucose-Capillary: 54 mg/dL — ABNORMAL LOW (ref 70–99)
Glucose-Capillary: 75 mg/dL (ref 70–99)
Glucose-Capillary: 76 mg/dL (ref 70–99)
Glucose-Capillary: 88 mg/dL (ref 70–99)
Glucose-Capillary: 98 mg/dL (ref 70–99)

## 2022-04-13 LAB — PREPARE RBC (CROSSMATCH)

## 2022-04-13 LAB — LACTATE DEHYDROGENASE: LDH: 200 U/L — ABNORMAL HIGH (ref 98–192)

## 2022-04-13 LAB — HEMOGLOBIN AND HEMATOCRIT, BLOOD
HCT: 28 % — ABNORMAL LOW (ref 36.0–46.0)
Hemoglobin: 8.7 g/dL — ABNORMAL LOW (ref 12.0–15.0)

## 2022-04-13 LAB — LACTIC ACID, PLASMA
Lactic Acid, Venous: 3.4 mmol/L (ref 0.5–1.9)
Lactic Acid, Venous: 3.3 mmol/L (ref 0.5–1.9)
Lactic Acid, Venous: 2.2 mmol/L (ref 0.5–1.9)
Lactic Acid, Venous: 2.2 mmol/L (ref 0.5–1.9)

## 2022-04-13 LAB — PHOSPHORUS: Phosphorus: 2.5 mg/dL (ref 2.5–4.6)

## 2022-04-13 LAB — PROTIME-INR
INR: 1.1 (ref 0.8–1.2)
Prothrombin Time: 14.1 seconds (ref 11.4–15.2)

## 2022-04-13 LAB — TSH: TSH: 0.048 u[IU]/mL — ABNORMAL LOW (ref 0.350–4.500)

## 2022-04-13 MED ORDER — SODIUM CHLORIDE 0.9% IV SOLUTION: Freq: Once | INTRAVENOUS | Status: AC

## 2022-04-13 MED ORDER — SODIUM CHLORIDE 0.9 % IV BOLUS: 500.0000 mL | Freq: Once | INTRAVENOUS | Status: DC

## 2022-04-13 MED ORDER — LACTATED RINGERS IV BOLUS
500.0000 mL | Freq: Once | INTRAVENOUS | Status: AC
  Administered 2022-04-13: 500 mL via INTRAVENOUS

## 2022-04-13 MED ORDER — DEXTROSE 50 % IV SOLN
12.5000 g | INTRAVENOUS | Status: AC
  Administered 2022-04-13: 12.5 g via INTRAVENOUS

## 2022-04-13 MED ORDER — DEXTROSE IN LACTATED RINGERS 5 % IV SOLN: INTRAVENOUS | Status: DC

## 2022-04-13 MED ORDER — GABAPENTIN 100 MG PO CAPS
100.0000 mg | ORAL_CAPSULE | Freq: Two times a day (BID) | ORAL | Status: DC
  Administered 2022-04-13 – 2022-04-25 (×25): 100 mg via ORAL
  Filled 2022-04-13 (×25): qty 1

## 2022-04-13 MED ORDER — POTASSIUM CHLORIDE 10 MEQ/100ML IV SOLN
10.0000 meq | INTRAVENOUS | Status: AC
  Administered 2022-04-13 (×4): 10 meq via INTRAVENOUS
  Filled 2022-04-13 (×4): qty 100

## 2022-04-13 MED ORDER — MIDODRINE HCL 5 MG PO TABS
5.0000 mg | ORAL_TABLET | Freq: Three times a day (TID) | ORAL | Status: DC
  Administered 2022-04-13 – 2022-04-17 (×6): 5 mg via ORAL
  Filled 2022-04-13 (×7): qty 1

## 2022-04-13 MED ORDER — DEXTROSE 10 % IV SOLN: INTRAVENOUS | Status: DC

## 2022-04-13 MED ORDER — FOLIC ACID 1 MG PO TABS
1.0000 mg | ORAL_TABLET | Freq: Every day | ORAL | Status: DC
  Administered 2022-04-13 – 2022-04-25 (×13): 1 mg via ORAL
  Filled 2022-04-13 (×13): qty 1

## 2022-04-13 MED ORDER — DEXTROSE 50 % IV SOLN
INTRAVENOUS | Status: AC
  Filled 2022-04-13: qty 50

## 2022-04-13 MED ORDER — VITAMIN D (ERGOCALCIFEROL) 1.25 MG (50000 UNIT) PO CAPS
50000.0000 [IU] | ORAL_CAPSULE | ORAL | Status: DC
  Administered 2022-04-13 – 2022-04-20 (×2): 50000 [IU] via ORAL
  Filled 2022-04-13 (×2): qty 1

## 2022-04-13 MED ORDER — KCL IN DEXTROSE-NACL 20-5-0.9 MEQ/L-%-% IV SOLN
INTRAVENOUS | Status: DC
  Filled 2022-04-13 (×5): qty 1000

## 2022-04-13 MED ORDER — MAGNESIUM SULFATE 4 GM/100ML IV SOLN
4.0000 g | Freq: Once | INTRAVENOUS | Status: AC
  Administered 2022-04-13: 4 g via INTRAVENOUS
  Filled 2022-04-13: qty 100

## 2022-04-13 NOTE — Significant Event (Signed)
Hypoglycemic Event  CBG: 54  Treatment: D50 25 mL (12.5 gm)  Symptoms: Pale  Follow-up CBG: Time:0220 CBG Result: 125  Possible Reasons for Event: Inadequate meal intake  Comments/MD notified: Sharion Settler, NP notified.     Sallye Lat

## 2022-04-13 NOTE — Progress Notes (Signed)
       CROSS COVER NOTE  NAME: Stacy Grimes MRN: [id number] DOB : 1950-10-12    HPI/Events of Note   Report: Received via chat patient became hypotensive and a rapid response was called   On review of chart:patient had received metoprolol xl approx 21130 this past evening. Day prior was treated for dig toxicity with digifab and metoprolol was held Currently receiving D5W for hypernatremia and aki Treated for hyperkalemia yesterday am   Assessment and  Interventions   Assessment:  Plan: Total 1l of LR  Hypoglycemic episode - dextrose fluids held while bolus infusing, 1 amp d50 given with resolution of hypoglycemia - very sensitive to not having dextrose source, Cbc hgb 6.8 - type and screen transfuse one unit PRBCs - discussed and consent obtained via phone from son Stacy Grimes; hold maintenance fluids and start d10 at 30 while blood infusing  Bmp renal function improving 40 meq IV potassium ordered; NA now 140 - discontinue D5W maintenance fluids D5LR at 125  Lactic acid 3.4 - trend  Discontinue metoprolol  Mag level 1.4 - 4gm IV ordered      Kathlene Cote NP [facility]

## 2022-04-13 NOTE — Evaluation (Signed)
Physical Therapy Evaluation Patient Details Name: Stacy Grimes MRN: [id number] DOB: [birth year] Today's Date: 04/13/2022  History of Present Illness  Pt is a 72 year old female with history of stroke, dementia, COPD on chronic 4 L nasal cannula,  cardiomyopathy, seizure disorder, PE, and severe PAH s/p AICD who presents to the emergency department for chief concerns of altered mental status. MD assessment includes: altered mental status, UTI, mild hematuria,  hypoglycemia, dehydration, hypotension, lactic acidosis, AKI, normocytic anemia, thrombocytopenia, hyperkalemia, hyponatremia, hypomagnesemia, hypocalcemia, focal seizures with secondary generalization, and physical deconditioning.   Clinical Impression  Pt awake and alert but only oriented to name.  Pt unable to answer questions with below history taken from chart review and not confirmed.  Pt only able to follow occasional 1-step commands with max multi-modal cuing and required total assist with bed mobility tasks.  Once in sitting at the EOB pt required constant mod to max A for stability in various planes, unsafe to attempt standing at this time.  Pt is at an extremely high risk for falls and would not be safe to return to her prior living situation at this time.  Pt will benefit from PT services in a SNF setting upon discharge to safely address deficits listed in patient problem list for decreased caregiver assistance and eventual return to PLOF.        Recommendations for follow up therapy are one component of a multi-disciplinary discharge planning process, led by the attending physician.  Recommendations may be updated based on patient status, additional functional criteria and insurance authorization.  Follow Up Recommendations Skilled nursing-short term rehab (<3 hours/day) Can patient physically be transported by private vehicle: No    Assistance Recommended at Discharge Frequent or constant Supervision/Assistance  Patient can  return home with the following  Two people to help with walking and/or transfers;Two people to help with bathing/dressing/bathroom;Assistance with cooking/housework;Assistance with feeding;Direct supervision/assist for medications management;Direct supervision/assist for financial management;Assist for transportation;Help with stairs or ramp for entrance    Equipment Recommendations Other (comment) (TBD at next venue of care)  Recommendations for Other Services       Functional Status Assessment Patient has had a recent decline in their functional status and/or demonstrates limited ability to make significant improvements in function in a reasonable and predictable amount of time     Precautions / Restrictions Precautions Precautions: Fall Restrictions Other Position/Activity Restrictions: h/o seizures      Mobility  Bed Mobility Overal bed mobility: Needs Assistance Bed Mobility: Supine to Sit, Sit to Supine     Supine to sit: +2 for physical assistance, Total assist Sit to supine: Total assist, +2 for physical assistance   General bed mobility comments: total assist for BLE and trunk control    Transfers                   General transfer comment: Unable/unsafe to attempt; pt with very poor static sitting balance requiring constant mod to max A to prevent LOB    Ambulation/Gait                  Stairs            Wheelchair Mobility    Modified Rankin (Stroke Patients Only)       Balance Overall balance assessment: Needs assistance Sitting-balance support: Bilateral upper extremity supported Sitting balance-Leahy Scale: Zero  Pertinent Vitals/Pain Pain Assessment Pain Assessment: PAINAD Breathing: normal Negative Vocalization: occasional moan/groan, low speech, negative/disapproving quality Facial Expression: smiling or inexpressive Body Language: relaxed Consolability: distracted or  reassured by voice/touch PAINAD Score: 2 Pain Intervention(s): Repositioned, Monitored during session    [place] expects to be discharged to:: Private residence Living Arrangements: Spouse/significant other Available Help at Discharge: Family;Available 24 hours/day Type of Home: Mobile home Home Access: Ramped entrance       Home Layout: One level Home Equipment: Rolling Walker (2 wheels);BSC/3in1;Rollator (4 wheels);Cane - single point Additional Comments: History from chart review secondary to pt being a poor historian with no family or caregivers available to assist    Prior Function               Mobility Comments: unknown, per chart review prior to Sept 2023 pt was Ind with mobility and ADLs with PRN use of a cane       Hand Dominance   Dominant Hand: Right    Extremity/Trunk Assessment   Upper Extremity Assessment Upper Extremity Assessment: Generalized weakness    Lower Extremity Assessment Lower Extremity Assessment: Generalized weakness       Communication   Communication: HOH  Cognition Arousal/Alertness: Awake/alert Behavior During Therapy: WFL for tasks assessed/performed Overall Cognitive Status: No family/caregiver present to determine baseline cognitive functioning                                 General Comments: Pt alert to name only, able to follow 1 step commands occasionally with extra time and cuing        General Comments      Exercises     Assessment/Plan    PT Assessment Patient needs continued PT services  PT Problem List Decreased strength;Decreased activity tolerance;Decreased balance;Decreased mobility;Decreased knowledge of use of DME       PT Treatment Interventions DME instruction;Gait training;Functional mobility training;Therapeutic activities;Therapeutic exercise;Balance training;Patient/family education    PT Goals (Current goals can be found in the Care Plan section)  Acute Rehab  PT Goals PT Goal Formulation: Patient unable to participate in goal setting Time For Goal Achievement: 04/26/22 Potential to Achieve Goals: Fair    Frequency Min 2X/week     Co-evaluation               AM-PAC PT "6 Clicks" Mobility  Outcome Measure Help needed turning from your back to your side while in a flat bed without using bedrails?: Total Help needed moving from lying on your back to sitting on the side of a flat bed without using bedrails?: Total Help needed moving to and from a bed to a chair (including a wheelchair)?: Total Help needed standing up from a chair using your arms (e.g., wheelchair or bedside chair)?: Total Help needed to walk in hospital room?: Total Help needed climbing 3-5 steps with a railing? : Total 6 Click Score: 6    End of Session Equipment Utilized During Treatment: Oxygen Activity Tolerance: Patient tolerated treatment well Patient left: in bed;with call bell/phone within reach;with bed alarm set Nurse Communication: Mobility status PT Visit Diagnosis: Difficulty in walking, not elsewhere classified (R26.2);Muscle weakness (generalized) (M62.81)    Time: 1444-1500 PT Time Calculation (min) (ACUTE ONLY): 16 min   Charges:   PT Evaluation $PT Eval Moderate Complexity: 1 Mod        D. Scott Mahiya Kercheval PT, DPT 04/13/22, 3:18 PM

## 2022-04-13 NOTE — Progress Notes (Incomplete)
Patient Teacher, English as a foreign language completed.    The patient is currently admitted and upon discharge could be taking lacosamide.  The current 30 day co-pay is, $29 to $43 depending on tablet strength  The patient is insured through [facility], PharmD, BCPS

## 2022-04-13 NOTE — Progress Notes (Signed)
Neurology Progress Note  Major interval events:  - Hypotensive, hypoglycemic overnight - Received 1 u pRBCs for Hgb 8 -> 6.7  Subjective: - BP cuff bothering her on LUE  Exam: Vitals:   04/13/22 0600 04/13/22 0729  BP: (!) 102/55 (!) 107/47  Pulse: 95 97  Resp: 18 14  Temp:  (!) 97.4 F (36.3 C)  SpO2:  100%   Gen: In bed, comfortable  Resp: non-labored breathing, no grossly audible wheezing Cardiac: Perfusing extremities well  Abd: soft, nt  Neuro: MS: Awake, alert, conversant, poor attention/concentration.  Reports she is 72 years old.  States her name is Stacy Grimes.  Follows some simple commands CN: EOMI grossly, face symmetric, tongue midline, pupils equal and round Motor: Using bilateral upper extremities equally, equally reactive to touch in bilateral upper extremities  Pertinent Labs:  Basic Metabolic Panel: Recent Labs  Lab 04/11/22 1252 04/11/22 1905 04/12/22 0154 04/12/22 0444 04/12/22 1046 04/12/22 1556 04/12/22 2139 04/13/22 0059 04/13/22 0334  NA 148*  --  149* 148*  --   --   --  140 140  K 6.3* 5.4* 5.9* 4.9 4.3 4.5 4.2 3.7 3.4*  CL 116*  --  117* 117*  --   --   --  108 109  CO2 21*  --  26 22  --   --   --  23 22  GLUCOSE 114*  --  66* 144*  --   --   --  74 124*  BUN 62*  --  54* 55*  --   --   --  44* 44*  CREATININE 3.72*  --  3.26* 3.20*  --   --   --  2.40* 2.28*  CALCIUM 9.3  --  8.8* 9.1  --   --   --  8.2* 8.2*  MG  --  2.0  --   --   --   --   --  1.4* 1.4*  PHOS  --   --   --   --   --   --   --  2.5  --     CBC: Recent Labs  Lab 04/11/22 1252 04/12/22 0444 04/13/22 0059 04/13/22 0334  WBC 9.3 9.4 8.3 8.3  NEUTROABS 6.5  --  4.5  --   HGB 10.1* 8.5* 6.8* 6.7*  HCT 34.5* 29.0* 22.6* 22.5*  MCV 82.3 82.4 80.7 80.4  PLT 141* PLATELET CLUMPS NOTED ON SMEAR, UNABLE TO ESTIMATE 65* 64*    Coagulation Studies: Recent Labs    04/11/22 1252  LABPROT 14.1  INR 1.1    Lab Results  Component Value Date   VALPROATE 40  (L) 04/12/2022   EKG without evidence of heart block, does have pacer dependent rhythm with atrial pacing with ventricular response  Assessment: Well-controlled focal seizures, likely element of toxic/metabolic encephalopathy on top of her delirium secondary to her acute medical issues including hypoglycemia, hypotension, AKI (resolving), anemia.  Valproic acid level near therapeutic range, would not adjust dose at this time.  Rarely Depakote is associated with thrombocytopenia, could consider switching to Vimpat if needed given that she has failed Keppra due to GI side effects.  Alternatively could consider clonazepam 1 mg twice daily.  However for now I will hold off on changes, and follow along given benefit of Depakote for mood issues for this patient and potential to find another medical etiology of her thrombocytopenia.  Her examination continues to be at her baseline today.  Appreciate pharmacy  assistance in determining that with them that would be affordable for patient if a change was required: Vimpat copay would be $29/mo vs $16/mo for Depakote   Impression: -Baseline dementia, at least some component of which is vascular dementia -Focal seizures with secondary generalization, stable -Hypoglycemia,  -Hypotension, -Resolving AKI -Heart failure with reduced EF, dilated cardiomyopathy, severe pulmonary arterial hypertension, with AICD in place -UTI on ceftriaxone -Poor oral intake -Anemia/thrombocytopenia undergoing workup by medical team -Slightly low valproic acid level but therapeutic for patient  Recommendations: - Continue Depakote 250 mg twice daily, level of 40 is acceptable as her seizures are controlled at this level - Neurology will follow along for at least 1 more day  Lesleigh Noe MD-PhD [facility] [phone]   Greater than 50 minutes spent in care of this patient, majority in direct patient care, with complex medical decision making requiring  consideration of multiple medical comorbidities and antiseizure medication choice

## 2022-04-13 NOTE — Progress Notes (Addendum)
PROGRESS NOTE  Stacy Grimes [id number] DOB: May 28, 1950   PCP: Juluis Pitch, MD  Patient is from: Home  DOA: 04/11/2022 LOS: 2  Chief complaints Chief Complaint  Patient presents with   Altered Mental Status     Brief Narrative / Interim history: 72 year old F with PMH of dementia, COPD/chronic hypoxic RF on 4 L by [place], CVA, HFrEF with AICD, HTN, anxiety and seizure disorder presenting with altered mental status for 3 days in the setting of possible polypharmacy, dehydration and possible UTI.  Also noted to have hypocalcemia with lactic acidosis, AKI hyponatremia and hyperkalemia.  Digoxin level elevated.  CT head and CXR without acute finding.  Received IV fluid, IV calcium and treatment for hyperkalemia.  Started on IV antibiotics for possible UTI.  Neurology consulted and adjusted medications to minimize polypharmacy.     Subjective: Seen and examined earlier this morning.  Hypotensive to 52/40 overnight but improved after IV fluid.  She was also bradycardic to 45.  Hypoglycemic to 52.  Hgb dropped to 6.8 and 1 unit of blood ordered.  No complaint this morning but not a reliable historian.  She is only oriented to self.  Follows commands.  She has mittens bilaterally.  Objective: Vitals:   04/13/22 0511 04/13/22 0531 04/13/22 0600 04/13/22 0729  BP: (!) 84/43 (!) 85/45 (!) 102/55 (!) 107/47  Pulse: 91 94 95 97  Resp: '19 17 18 14  '$ Temp: 97.6 F (36.4 C) (!) 97.1 F (36.2 C)  (!) 97.4 F (36.3 C)  TempSrc: Oral Oral    SpO2: 100% 100%  100%  Weight:      Height:        Examination:  GENERAL: No apparent distress.  Nontoxic. HEENT: MMM.  Vision and hearing grossly intact.  NECK: Supple.  No apparent JVD.  RESP:  No IWOB.  Fair aeration bilaterally. CVS:  RRR. Heart sounds normal.  ABD/GI/GU: BS+. Abd soft, NTND.  MSK/EXT:  Moves extremities.  Muscle mass and subcu fat loss. SKIN: no apparent skin lesion or wound NEURO: Awake and alert.  Oriented to self.   Follows commands.  No apparent focal neuro deficit. PSYCH: Calm. Normal affect.   Procedures:  None  Microbiology summarized: [id number], influenza and RSV PCR nonreactive Blood cultures NGTD  Assessment and plan: Principal Problem:   Altered mental status Active Problems:   AKI (acute kidney injury) ([place])   Chronic systolic CHF (congestive heart failure) (HCC)   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   Chronic obstructive pulmonary disease, unspecified COPD type ([place])   Chronic respiratory failure with hypoxia (H[facility])   Dementia ([place])   Hypertension   Benign neoplasm of colon   Seizures ([place])   Status epilepticus ([place])   Protein calorie malnutrition ([place])   Hypothyroidism   Depression   ICD (implantable cardioverter-defibrillator) in place   Hyperkalemia   Malnutrition of moderate degree  Altered mental status: Multifactorial including dehydration, AKI/azotemia, possible UTI, polypharmacy, underlying dementia.  UA with large LE but rare bacteria and no nitrite.  Unfortunately, urine culture was not sent.  Blood cultures NGTD.  CT head and CXR without acute finding.  Ammonia normal.  B12 elevated.  TSH low but free T4 normal.  Dementia seems to be severe.  She has significant electrolyte derangement likely from dehydration.  Degenerative level is elevated as well.  Seems to be on gabapentin, Remeron and Atarax at home which increases her risk of polypharmacy.  She has no focal neurologic state to suggest CVA. -Treat treatable  causes-possible UTI, dehydration and hypotension -Reorientation and delirium precautions -Appreciate help by neurology-adjusted meds including Depakote, Remeron and Atarax. -Resume gabapentin at reduced dose  Urinary tract infection, mild hematuria: UA with large LE but rare bacteria and no nitrites.  Unfortunately urine culture was not sent.  Blood cultures NGTD. -Continue ceftriaxone  empirically  Hypoglycemia/dehydration/hypotension/lactic acidosis: -Discussed changing LR fluid given lactic acidosis.   -Start D5 NS with KCl at 75 cc an hour. -Discontinue metoprolol. -Check cortisol level and echocardiogram -Repeat lactic acid. -May add midodrine if blood pressure drops again  AKI: Likely prerenal from dehydration and poor p.o. intake.  Also Lasix and losartan at home.  CT renal stone study without acute finding.  CK within normal. Recent Labs    03/06/22 2212 03/07/22 0458 03/08/22 0450 03/09/22 0536 03/10/22 0449 04/11/22 1252 04/12/22 0154 04/12/22 0444 04/13/22 0059 04/13/22 0334  BUN '15 18 15 12 8 '$ 62* 54* 55* 44* 44*  CREATININE 1.13* 0.95 0.94 0.90 0.80 3.72* 3.26* 3.20* 2.40* 2.28*  -D5-NS-KCl at 75 cc an hour -Closely monitor renal functions -Dose medications renally -Avoid nephrotoxic meds -Strict intake and output.   Normocytic anemia/thrombocytopenia: Notable drop in Hgb.  No obvious bleeding.  Hemodilution?  Folic acid low. Recent Labs    01/11/22 0809 01/12/22 0302 01/13/22 0258 01/16/22 0355 03/06/22 2212 03/09/22 0536 04/11/22 1252 04/12/22 0444 04/13/22 0059 04/13/22 0334  HGB 8.9* 8.9* 10.6* 10.0* 10.9* 10.0* 10.1* 8.5* 6.8* 6.7*  -Agree with transfusing 1 unit -Check anemia panel -Monitor H&H -Hold subcu heparin and Plavix for now -Check FOBT, haptoglobin, LDH, coag labs, LFT and HIT antibody. -Folic acid supplementation   Chronic HFrEF/DCM/severe PAH s/p AICD: Stable.  Dehydrated on presentation.  TTE 10/2021 with LVEF of 40 to 45%, GH, indeterminate DD and severe PASP -Hold diuretics and losartan -Hold metoprolol given hypotension -Recheck echocardiogram -Closely monitor fluid and respiratory status while on IV fluid  Hyperkalemia/hyponatremia: Resolved.  Hypokalemia/hypomagnesemia/hypocalcemia -Monitor replenish as appropriate. -IV fluid as above.  Vitamin D deficiency -Vitamin D 50,000 units  weekly   Elevated digoxin levels -Continue holding digoxin  History of seizure disorder: On Depakote at home.  Depakote level slightly low.  Stable -Continue Depakote   Chronic PE: Stable. Continue home bronchodilators, as needed added   Dementia with behavioral disturbance:  -Continue donepezil and memantine -See AMS above   Depression: Stable. -Continue hydroxyzine and mirtazapine.  No longer on Effexor.   Hypothyroidism: TSH low but free T4 normal. -Continue home Synthroid  Physical deconditioning -PT/OT   Moderate malnutrition Body mass index is 23.03 kg/m. Nutrition Problem: Moderate Malnutrition Etiology: chronic illness (dementia) Signs/Symptoms: mild fat depletion, moderate fat depletion, mild muscle depletion, moderate muscle depletion, percent weight loss Interventions: Magic cup, MVI, Prostat   DVT prophylaxis:  Place TED hose Start: 04/11/22 1607  Code Status: Full code Family Communication: None at bedside Level of care: Progressive Status is: Inpatient Remains inpatient appropriate because: Altered mental status, AKI, possible UTI, hypotension and electrolyte derangements   Final disposition: TBD Consultants:  Neurology  55 minutes with more than 50% spent in reviewing records, counseling patient/family and coordinating care.   Sch Meds:  Scheduled Meds:  (feeding supplement) PROSource Plus  30 mL Oral TID BM   allopurinol  50 mg Oral Daily   atorvastatin  80 mg Oral QHS   divalproex  250 mg Oral Q12H   donepezil  10 mg Oral QHS   famotidine  20 mg Oral Daily   folic acid  1 mg  Oral Daily   gabapentin  100 mg Oral BID   levothyroxine  100 mcg Oral QAC breakfast   memantine  10 mg Oral BID   mirtazapine  7.5 mg Oral QHS   mometasone-formoterol  2 puff Inhalation BID   multivitamin with minerals  1 tablet Oral Daily   Vitamin D (Ergocalciferol)  50,000 Units Oral Q7 days   Continuous Infusions:  cefTRIAXone (ROCEPHIN)  IV Stopped  (04/12/22 1505)   dextrose 5 % and 0.9 % NaCl with KCl 20 mEq/L     PRN Meds:.acetaminophen **OR** acetaminophen, hydrOXYzine, ipratropium-albuterol, ondansetron (ZOFRAN) IV, polyvinyl alcohol, senna-docusate, traZODone  Antimicrobials: Anti-infectives (From admission, onward)    Start     Dose/Rate Route Frequency Ordered Stop   04/11/22 1500  cefTRIAXone (ROCEPHIN) 2 g in sodium chloride 0.9 % 100 mL IVPB        2 g 200 mL/hr over 30 Minutes Intravenous Every 24 hours 04/11/22 1454 04/18/22 1459        I have personally reviewed the following labs and images: CBC: Recent Labs  Lab 04/11/22 1252 04/12/22 0444 04/13/22 0059 04/13/22 0334  WBC 9.3 9.4 8.3 8.3  NEUTROABS 6.5  --  4.5  --   HGB 10.1* 8.5* 6.8* 6.7*  HCT 34.5* 29.0* 22.6* 22.5*  MCV 82.3 82.4 80.7 80.4  PLT 141* PLATELET CLUMPS NOTED ON SMEAR, UNABLE TO ESTIMATE 65* 64*   BMP &GFR Recent Labs  Lab 04/11/22 1252 04/11/22 1905 04/12/22 0154 04/12/22 0444 04/12/22 1046 04/12/22 1556 04/12/22 2139 04/13/22 0059 04/13/22 0334  NA 148*  --  149* 148*  --   --   --  140 140  K 6.3* 5.4* 5.9* 4.9 4.3 4.5 4.2 3.7 3.4*  CL 116*  --  117* 117*  --   --   --  108 109  CO2 21*  --  26 22  --   --   --  23 22  GLUCOSE 114*  --  66* 144*  --   --   --  74 124*  BUN 62*  --  54* 55*  --   --   --  44* 44*  CREATININE 3.72*  --  3.26* 3.20*  --   --   --  2.40* 2.28*  CALCIUM 9.3  --  8.8* 9.1  --   --   --  8.2* 8.2*  MG  --  2.0  --   --   --   --   --  1.4* 1.4*  PHOS  --   --   --   --   --   --   --  2.5  --    Estimated Creatinine Clearance: 18.4 mL/min (A) (by C-G formula based on SCr of 2.28 mg/dL (H)). Liver & Pancreas: Recent Labs  Lab 04/11/22 1252  AST 45*  ALT 19  ALKPHOS 151*  BILITOT 0.3  PROT 7.7  ALBUMIN 2.7*   No results for input(s): "LIPASE", "AMYLASE" in the last 168 hours. Recent Labs  Lab 04/11/22 1253  AMMONIA 25   Diabetic: No results for input(s): "HGBA1C" in the last 72  hours. Recent Labs  Lab 04/12/22 1444 04/13/22 0013 04/13/22 0153 04/13/22 0220 04/13/22 0817  GLUCAP 104* 75 54* 125* 76   Cardiac Enzymes: Recent Labs  Lab 04/12/22 0154  CKTOTAL 214   No results for input(s): "PROBNP" in the last 8760 hours. Coagulation Profile: Recent Labs  Lab 04/11/22 1252  INR 1.1  Thyroid Function Tests: Recent Labs    04/13/22 0333 04/13/22 0334  TSH  --  0.048*  FREET4 0.97  --    Lipid Profile: No results for input(s): "CHOL", "HDL", "LDLCALC", "TRIG", "CHOLHDL", "LDLDIRECT" in the last 72 hours. Anemia Panel: Recent Labs    04/11/22 1907 04/13/22 0334  VITAMINB12 >7,500*  --   FOLATE  --  3.0*   Urine analysis:    Component Value Date/Time   COLORURINE YELLOW (A) 04/11/2022 2335   APPEARANCEUR CLOUDY (A) 04/11/2022 2335   APPEARANCEUR Clear 07/31/2013 1658   LABSPEC 1.018 04/11/2022 2335   LABSPEC 1.018 07/31/2013 1658   PHURINE 5.0 04/11/2022 2335   GLUCOSEU NEGATIVE 04/11/2022 2335   GLUCOSEU Negative 07/31/2013 1658   HGBUR SMALL (A) 04/11/2022 2335   BILIRUBINUR NEGATIVE 04/11/2022 2335   BILIRUBINUR Negative 07/31/2013 1658   KETONESUR NEGATIVE 04/11/2022 2335   PROTEINUR 30 (A) 04/11/2022 2335   UROBILINOGEN 0.2 03/28/2012 1526   NITRITE NEGATIVE 04/11/2022 2335   LEUKOCYTESUR LARGE (A) 04/11/2022 2335   LEUKOCYTESUR Negative 07/31/2013 1658   Sepsis Labs: Invalid input(s): "PROCALCITONIN", "LACTICIDVEN"  Microbiology: Recent Results (from the past 240 hour(s))  Culture, blood (Routine x 2)     Status: None (Preliminary result)   Collection Time: 04/11/22 12:52 PM   Specimen: BLOOD  Result Value Ref Range Status   Specimen Description BLOOD BLOOD RIGHT ARM  Final   Special Requests   Final    BOTTLES DRAWN AEROBIC AND ANAEROBIC Blood Culture adequate volume   Culture   Final    NO GROWTH 2 DAYS Performed at [facility], [address] [place], [place] [address]    Report Status PENDING   Incomplete  Culture, blood (Routine x 2)     Status: None (Preliminary result)   Collection Time: 04/11/22 12:53 PM   Specimen: BLOOD  Result Value Ref Range Status   Specimen Description BLOOD LEFT ANTECUBITAL  Final   Special Requests   Final    BOTTLES DRAWN AEROBIC AND ANAEROBIC Blood Culture adequate volume   Culture   Final    NO GROWTH 2 DAYS Performed at [facility], [address] [place], [place] [address]    Report Status PENDING  Incomplete  Resp panel by RT-PCR (RSV, Flu A&B, Covid) Anterior Nasal Swab     Status: None   Collection Time: 04/11/22 12:53 PM   Specimen: Anterior Nasal Swab  Result Value Ref Range Status   SARS Coronavirus 2 by RT PCR NEGATIVE NEGATIVE Final    Comment: (NOTE) SARS-CoV-2 target nucleic acids are NOT DETECTED.  The SARS-CoV-2 RNA is generally detectable in upper respiratory specimens during the acute phase of infection. The lowest concentration of SARS-CoV-2 viral copies this assay can detect is 138 copies/mL. A negative result does not preclude SARS-Cov-2 infection and should not be used as the sole basis for treatment or other patient management decisions. A negative result may occur with  improper specimen collection/handling, submission of specimen other than nasopharyngeal swab, presence of viral mutation(s) within the areas targeted by this assay, and inadequate number of viral copies(<138 copies/mL). A negative result must be combined with clinical observations, patient history, and epidemiological information. The expected result is Negative.  Fact Sheet for Patients:  EntrepreneurPulse.com.au  Fact Sheet for Healthcare Providers:  IncredibleEmployment.be  This test is no t yet approved or cleared by the [place] FDA and  has been authorized for detection and/or diagnosis of SARS-CoV-2 by FDA under an  Emergency Use Authorization (EUA). This EUA will remain  in effect  (meaning this test can be used) for the duration of the COVID-19 declaration under Section 564(b)(1) of the Act, 21 U.S.C.section 360bbb-3(b)(1), unless the authorization is terminated  or revoked sooner.       Influenza A by PCR NEGATIVE NEGATIVE Final   Influenza B by PCR NEGATIVE NEGATIVE Final    Comment: (NOTE) The Xpert Xpress SARS-CoV-2/FLU/RSV plus assay is intended as an aid in the diagnosis of influenza from Nasopharyngeal swab specimens and should not be used as a sole basis for treatment. Nasal washings and aspirates are unacceptable for Xpert Xpress SARS-CoV-2/FLU/RSV testing.  Fact Sheet for Patients: EntrepreneurPulse.com.au  Fact Sheet for Healthcare Providers: IncredibleEmployment.be  This test is not yet approved or cleared by the [place] FDA and has been authorized for detection and/or diagnosis of SARS-CoV-2 by FDA under an Emergency Use Authorization (EUA). This EUA will remain in effect (meaning this test can be used) for the duration of the COVID-19 declaration under Section 564(b)(1) of the Act, 21 U.S.C. section 360bbb-3(b)(1), unless the authorization is terminated or revoked.     Resp Syncytial Virus by PCR NEGATIVE NEGATIVE Final    Comment: (NOTE) Fact Sheet for Patients: EntrepreneurPulse.com.au  Fact Sheet for Healthcare Providers: IncredibleEmployment.be  This test is not yet approved or cleared by the [place] FDA and has been authorized for detection and/or diagnosis of SARS-CoV-2 by FDA under an Emergency Use Authorization (EUA). This EUA will remain in effect (meaning this test can be used) for the duration of the COVID-19 declaration under Section 564(b)(1) of the Act, 21 U.S.C. section 360bbb-3(b)(1), unless the authorization is terminated or revoked.  Performed at [facility], [address] [place], [name] [address]     Radiology  Studies: No results found.    Jayvin Hurrell T. [place]  If 7PM-7AM, please contact night-coverage [email or web address] 04/13/2022, 10:32 AM

## 2022-04-13 NOTE — Progress Notes (Signed)
PT Cancellation Note  Patient Details Name: Stacy Grimes MRN: [id number] DOB: 1950-11-13   Cancelled Treatment:    Reason Eval/Treat Not Completed: Medical issues which prohibited therapy: Pt's current Hgb 6.7 falling outside guidelines for participation with PT services.  Will attempt to see pt at a future date/time as medically appropriate.     Linus Salmons PT, DPT 04/13/22, 9:54 AM

## 2022-04-13 NOTE — Progress Notes (Signed)
Chap responded to a rapid  response code. Pt being evaluated by nurses at this time. No spiritual or emotional support needed at this time.    04/13/22 0700  Spiritual Encounters  Type of Visit Initial  Care provided to: Patient  Conversation partners present during encounter Nurse  Referral source Code page  Reason for visit Code  OnCall Visit Yes  Interventions  Spiritual Care Interventions Made Compassionate presence  Spiritual Care Plan  Spiritual Care Issues Still Outstanding No further spiritual care needs at this time (see row info)

## 2022-04-13 NOTE — Progress Notes (Signed)
   04/13/22 0000  Assess: MEWS Score  Temp 97.9 F (36.6 C)  BP (!) 52/40  MAP (mmHg) (!) 45  Pulse Rate (!) 34  ECG Heart Rate 97  Resp (!) 24  SpO2 100 %  O2 Device Nasal Cannula  O2 Flow Rate (L/min) 4 L/min  Assess: MEWS Score  MEWS Temp 0  MEWS Systolic 3  MEWS Pulse 0  MEWS RR 1  MEWS LOC 0  MEWS Score 4  MEWS Score Color Red  Assess: if the MEWS score is Yellow or Red  Were vital signs taken at a resting state? Yes  Focused Assessment No change from prior assessment  Does the patient meet 2 or more of the SIRS criteria? Yes  Does the patient have a confirmed or suspected source of infection? Yes  Provider and Rapid Response Notified? Yes  MEWS guidelines implemented  Yes, red  Treat  MEWS Interventions Considered administering scheduled or prn medications/treatments as ordered  Take Vital Signs  Increase Vital Sign Frequency  Red: Q1hr x2, continue Q4hrs until patient remains green for 12hrs  Escalate  MEWS: Escalate Red: Discuss with charge nurse and notify provider. Consider notifying RRT. If remains red for 2 hours consider need for higher level of care  Provider Notification  Provider Name/Title Anne Ng  Date Provider Notified 04/13/22  Time Provider Notified 0021  Method of Notification  (Rapid response called, secure chat)  Notification Reason Change in status  Provider response See new orders  Date of Provider Response 04/13/22  Time of Provider Response 0021  Notify: Rapid Response  Name of Rapid Response RN Notified Erica,RN  Date Rapid Response Notified 04/13/22  Time Rapid Response Notified 0010  Assess: SIRS CRITERIA  SIRS Temperature  0  SIRS Pulse 1  SIRS Respirations  1  SIRS WBC 0  SIRS Score Sum  2   Pt confused at baseline, verbally responsive with interaction with staff. Denies pain. No distress noted. New orders noted for labs and IVF bolus x2. Anne Ng updated on pt's status.

## 2022-04-13 NOTE — Progress Notes (Signed)
Pt alert and verbally responsive, remains confused. Denies pain. No distress noted. PRBC 1 unit infusing at this time, no s/sx of any adverse effects noted.

## 2022-04-13 NOTE — Progress Notes (Signed)
OT Cancellation Note  Patient Details Name: Stacy Grimes MRN: [id number] DOB: Jun 05, 1950   Cancelled Treatment:    Reason Eval/Treat Not Completed: Medical issues which prohibited therapy (Low (H&H); no new labs since pt recieved blood). Will attempt evaluation tomorrow or when H&H is in therapeutic range.  Vania Rea 04/13/2022, 1:18 PM

## 2022-04-14 DIAGNOSIS — R410 Disorientation, unspecified: Secondary | ICD-10-CM | POA: Diagnosis not present

## 2022-04-14 DIAGNOSIS — E162 Hypoglycemia, unspecified: Secondary | ICD-10-CM | POA: Diagnosis not present

## 2022-04-14 DIAGNOSIS — G928 Other toxic encephalopathy: Secondary | ICD-10-CM | POA: Diagnosis not present

## 2022-04-14 DIAGNOSIS — I959 Hypotension, unspecified: Secondary | ICD-10-CM | POA: Diagnosis not present

## 2022-04-14 DIAGNOSIS — E875 Hyperkalemia: Secondary | ICD-10-CM | POA: Diagnosis not present

## 2022-04-14 LAB — BASIC METABOLIC PANEL
Anion gap: 5 (ref 5–15)
BUN: 28 mg/dL — ABNORMAL HIGH (ref 8–23)
CO2: 25 mmol/L (ref 22–32)
Calcium: 8 mg/dL — ABNORMAL LOW (ref 8.9–10.3)
Chloride: 114 mmol/L — ABNORMAL HIGH (ref 98–111)
Creatinine, Ser: 1.44 mg/dL — ABNORMAL HIGH (ref 0.44–1.00)
GFR, Estimated: 39 mL/min — ABNORMAL LOW (ref >60)
Glucose, Bld: 97 mg/dL (ref 70–99)
Potassium: 4.3 mmol/L (ref 3.5–5.1)
Sodium: 144 mmol/L (ref 135–145)

## 2022-04-14 LAB — TYPE AND SCREEN
ABO/RH(D): O POS
Antibody Screen: NEGATIVE
Unit division: 0

## 2022-04-14 LAB — COMPREHENSIVE METABOLIC PANEL
ALT: 12 U/L (ref 0–44)
AST: 30 U/L (ref 15–41)
Albumin: 1.7 g/dL — ABNORMAL LOW (ref 3.5–5.0)
Alkaline Phosphatase: 107 U/L (ref 38–126)
Anion gap: 4 — ABNORMAL LOW (ref 5–15)
BUN: 30 mg/dL — ABNORMAL HIGH (ref 8–23)
CO2: 22 mmol/L (ref 22–32)
Calcium: 7.5 mg/dL — ABNORMAL LOW (ref 8.9–10.3)
Chloride: 117 mmol/L — ABNORMAL HIGH (ref 98–111)
Creatinine, Ser: 1.5 mg/dL — ABNORMAL HIGH (ref 0.44–1.00)
GFR, Estimated: 37 mL/min — ABNORMAL LOW (ref >60)
Glucose, Bld: 299 mg/dL — ABNORMAL HIGH (ref 70–99)
Potassium: 5.9 mmol/L — ABNORMAL HIGH (ref 3.5–5.1)
Sodium: 143 mmol/L (ref 135–145)
Total Bilirubin: 0.4 mg/dL (ref 0.3–1.2)
Total Protein: 5.4 g/dL — ABNORMAL LOW (ref 6.5–8.1)

## 2022-04-14 LAB — BPAM RBC
Blood Product Expiration Date: 202404132359
ISSUE DATE / TIME: 202403090513
Unit Type and Rh: 5100

## 2022-04-14 LAB — CBC
HCT: 26.8 % — ABNORMAL LOW (ref 36.0–46.0)
Hemoglobin: 8.5 g/dL — ABNORMAL LOW (ref 12.0–15.0)
MCH: 25.4 pg — ABNORMAL LOW (ref 26.0–34.0)
MCHC: 31.7 g/dL (ref 30.0–36.0)
MCV: 80.2 fL (ref 80.0–100.0)
Platelets: 53 10*3/uL — ABNORMAL LOW (ref 150–400)
RBC: 3.34 MIL/uL — ABNORMAL LOW (ref 3.87–5.11)
RDW: 19.9 % — ABNORMAL HIGH (ref 11.5–15.5)
WBC: 6.1 10*3/uL (ref 4.0–10.5)
nRBC: 0 % (ref 0.0–0.2)

## 2022-04-14 LAB — PHOSPHORUS: Phosphorus: 1.8 mg/dL — ABNORMAL LOW (ref 2.5–4.6)

## 2022-04-14 LAB — DIGOXIN LEVEL: Digoxin Level: 1.9 ng/mL (ref 0.8–2.0)

## 2022-04-14 LAB — GLUCOSE, CAPILLARY
Glucose-Capillary: 103 mg/dL — ABNORMAL HIGH (ref 70–99)
Glucose-Capillary: 65 mg/dL — ABNORMAL LOW (ref 70–99)
Glucose-Capillary: 72 mg/dL (ref 70–99)
Glucose-Capillary: 73 mg/dL (ref 70–99)
Glucose-Capillary: 83 mg/dL (ref 70–99)
Glucose-Capillary: 86 mg/dL (ref 70–99)
Glucose-Capillary: 94 mg/dL (ref 70–99)

## 2022-04-14 LAB — MAGNESIUM: Magnesium: 2.2 mg/dL (ref 1.7–2.4)

## 2022-04-14 MED ORDER — SODIUM PHOSPHATES 45 MMOLE/15ML IV SOLN
30.0000 mmol | Freq: Once | INTRAVENOUS | Status: AC
  Administered 2022-04-14: 30 mmol via INTRAVENOUS
  Filled 2022-04-14: qty 10

## 2022-04-14 MED ORDER — LACOSAMIDE 10 MG/ML PO SOLN
100.0000 mg | Freq: Two times a day (BID) | ORAL | Status: DC
  Administered 2022-04-14 – 2022-04-20 (×13): 100 mg via ORAL
  Filled 2022-04-14 (×13): qty 10

## 2022-04-14 MED ORDER — DEXTROSE-NACL 5-0.45 % IV SOLN: INTRAVENOUS | Status: DC

## 2022-04-14 NOTE — TOC Progression Note (Signed)
Transition of Care [facility]) - Progression Note    Patient Details  Name: Stacy Grimes MRN: [id number] Date of Birth: Dec 28, 1950  Transition of Care [facility]) CM/SW [place], [place] Phone Number: 04/14/2022, 11:40 AM  Clinical Narrative:     CSW spoke with pt's spouse, he states pt will not be going to SNF due to two bad experiences, he will be taking pt back home. Spouse states pt is active with [facility] and they come in the home about twice a week to work with pt. Pt's grandson is also in the home to assist with pt needs. Spouse is requesting pt be transported to the home via EMS due to her weakness.      Barriers to Discharge: Continued Medical Work up  Expected Discharge Plan and Services                                   HH Arranged: PT, OT, RN [facility] Agency: [facility] Date [place]: 04/12/22 Time [place]: [id number] Representative spoke with at [place]: [place] ([place]) Interventions [place]: No Food Insecurity (03/07/2022)  Housing: Low Risk  (03/07/2022)  Transportation Needs: No Transportation Needs (03/07/2022)  Utilities: Not At Risk (03/07/2022)  Depression (PHQ2-9): Low Risk  (09/01/2019)  Tobacco Use: Medium Risk (04/11/2022)    Readmission Risk Interventions    04/12/2022    1:27 PM 03/08/2022    1:45 PM  Readmission Risk Prevention Plan  Transportation Screening Complete Complete  Medication Review Press photographer) Complete Complete  PCP or Specialist appointment within 3-5 days of discharge Complete Complete  HRI or Home Care Consult Complete Complete  SW Recovery Care/Counseling Consult Complete Complete  Palliative Care Screening Not Complete   [place] Not Complete Complete

## 2022-04-14 NOTE — Progress Notes (Signed)
Neurology Progress Note  Brief seizure HPI: Seizure history - Semiology: Right gaze deviation, secondary generalization - Prodome: Unclear - Post-spell: Lethargy, confusion - Triggers: Missed medications - Frequency: Onset December 2023, recurrent seizures in February 2024 when antiseizure medications were tapered   Prior antiseizure medications Keppra discontinued for concern that it was causing diarrhea, therefore started on Depakote in February 2024 Depakote may or may not be contributing to thrombocytopenia (March 2024), therefore switched to Vimpat   Risk factors:  Multiple prior cortical strokes Remainder of resected history not elicited this encounter  Major interval events:  - Hypotensive, hypoglycemic 3/8 - 3/9 overnight and again last night - Received 1 u pRBCs for Hgb 8 -> 6.7 on 3/9 - Hyperkalemic this morning, subsequently hypoglycemic again - AKI improving  Subjective: - Sleeping on my arrival  Exam: Vitals:   04/13/22 2329 04/14/22 0336  BP: 125/62 (!) 119/54  Pulse: 85 91  Resp: 15 16  Temp: 98.3 F (36.8 C) 98.1 F (36.7 C)  SpO2: 100% 100%   Gen: In bed, comfortable  Resp: non-labored breathing, no grossly audible wheezing  Limited exam today, nursing attempting an IV at the time of my evaluation and requested that I minimize interaction with the patient to allow safe IV placement critical for her care at this time given her continued electrolyte issues  Neuro: Exam 3/9 MS: Awake, alert, conversant, poor attention/concentration.  Reports she is 72 years old (actually 2 y.o., husband states at baseline reports age in the 66s).  States her name is Stacy Grimes.  Follows some simple commands CN: EOMI grossly, face symmetric, tongue midline, pupils equal and round Motor: Using bilateral upper extremities equally, equally reactive to touch in bilateral upper extremities  Pertinent Labs:  Basic Metabolic Panel: Recent Labs  Lab 04/11/22 1905  04/12/22 0154 04/12/22 0444 04/12/22 1046 04/12/22 1556 04/12/22 2139 04/13/22 0059 04/13/22 0334 04/14/22 0446  NA  --  149* 148*  --   --   --  140 140 143  K 5.4* 5.9* 4.9   < > 4.5 4.2 3.7 3.4* 5.9*  CL  --  117* 117*  --   --   --  108 109 117*  CO2  --  26 22  --   --   --  '23 22 22  '$ GLUCOSE  --  66* 144*  --   --   --  74 124* 299*  BUN  --  54* 55*  --   --   --  44* 44* 30*  CREATININE  --  3.26* 3.20*  --   --   --  2.40* 2.28* 1.50*  CALCIUM  --  8.8* 9.1  --   --   --  8.2* 8.2* 7.5*  MG 2.0  --   --   --   --   --  1.4* 1.4* 2.2  PHOS  --   --   --   --   --   --  2.5  --  1.8*   < > = values in this interval not displayed.     CBC: Recent Labs  Lab 04/11/22 1252 04/12/22 0444 04/13/22 0059 04/13/22 0334 04/13/22 1350 04/14/22 0446  WBC 9.3 9.4 8.3 8.3  --  6.1  NEUTROABS 6.5  --  4.5  --   --   --   HGB 10.1* 8.5* 6.8* 6.7* 8.7* 8.5*  HCT 34.5* 29.0* 22.6* 22.5* 28.0* 26.8*  MCV 82.3 82.4 80.7 80.4  --  80.2  PLT 141* PLATELET CLUMPS NOTED ON SMEAR, UNABLE TO ESTIMATE 65* 64*  --  53*     Coagulation Studies: Recent Labs    04/11/22 1252 04/13/22 1128  LABPROT 14.1 14.1  INR 1.1 1.1     Lab Results  Component Value Date   VALPROATE 40 (L) 04/12/2022   EKG without evidence of heart block, does have pacer dependent rhythm with atrial pacing with ventricular response  Assessment: Well-controlled focal seizures, likely element of toxic/metabolic encephalopathy on top of her delirium secondary to her acute medical issues including hypoglycemia, hypotension, AKI (resolving), anemia.    Valproic acid level near therapeutic range, would not adjust dose at this time given this level appears to be adequately controlling her focal seizures.   However valproic acid is associated with thrombocytopenia and potentially can cause platelet dysfunction.  Her platelet count is relatively stable today but has dropped slightly from yesterday (64 -> 53).  On my  review of other medications that may be contributing to thrombocytopenia, digoxin is at times associated with these effects.  Digifab which she received for acute digoxin toxicity is not necessarily associated with thrombocytopenia but is associated with hypotension, hypokalemia etc. given her persistently low platelets today without clear cause per medicine team, will switch to Vimpat.  Alternatively could consider clonazepam 1 mg twice daily if she has worsening behavioral issues off of Depakote, but given her poor oral intake, trialing Vimpat first as this may be less sedating. Appreciate pharmacy assistance in determining that [place] would be affordable for patient: Vimpat copay would be $29/mo vs $16/mo for Depakote   Per nursing staff at bedside she was sleepier this morning in the setting of her hypoglycemia but otherwise her exam remains unchanged  Impression: -Baseline dementia, at least some component of which is vascular dementia -Focal seizures with secondary generalization, stable -Hypoglycemia/Hypotension, potentially in the setting of AKI as well as DigiFab treatment -Resolving AKI -Heart failure with reduced EF, dilated cardiomyopathy, severe pulmonary arterial hypertension, with AICD in place -UTI on ceftriaxone -Poor oral intake -Anemia/thrombocytopenia undergoing workup by medical team, no clear etiology at this time query digitalis versus Depakote related -Slightly low valproic acid level but therapeutic for patient  Recommendations: - Change Depakote to Vimpat 100 mg twice daily - Appreciate primary team management of her other medical issues - Inpatient neurology will be available as needed, please reach out if any other questions or concerns arise  Lesleigh Noe MD-PhD [facility] [phone]   Recommendations conveyed to primary team via secure chat

## 2022-04-14 NOTE — Progress Notes (Signed)
[facility] Progress Note  Patient: Stacy Grimes    [id number]  DOA: 04/11/2022     Date of Service: the patient was seen and examined on 04/14/2022  Chief Complaint  Patient presents with   Altered Mental Status   Brief hospital course: 72 year old F with PMH of dementia, COPD/chronic hypoxic RF on 4 L by [place], CVA, HFrEF with AICD, HTN, anxiety and seizure disorder presenting with altered mental status for 3 days in the setting of possible polypharmacy, dehydration and possible UTI.  Also noted to have hypocalcemia with lactic acidosis, AKI hyponatremia and hyperkalemia.  Digoxin level elevated.  CT head and CXR without acute finding.  Received IV fluid, IV calcium and treatment for hyperkalemia.  Started on IV antibiotics for possible UTI.  Neurology consulted and adjusted medications to minimize polypharmacy    Assessment and Plan: Principal Problem:   Altered mental status Active Problems:   AKI (acute kidney injury) ([place])   Chronic systolic CHF (congestive heart failure) (HCC)   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   Chronic obstructive pulmonary disease, unspecified COPD type ([place])   Chronic respiratory failure with hypoxia (HCC)   Dementia ([facility])   Hypertension   Benign neoplasm of colon   Seizures ([place])   Status epilepticus ([place])   Protein calorie malnutrition ([employer])   Hypothyroidism   Depression   ICD (implantable cardioverter-defibrillator) in place   Hyperkalemia   Malnutrition of moderate degree  Altered mental status: Multifactorial including dehydration, AKI/azotemia, possible UTI, polypharmacy, underlying dementia.  UA with large LE but rare bacteria and no nitrite.  Unfortunately, urine culture was not sent.  Blood cultures NGTD.  CT head and CXR without acute finding.  Ammonia normal.  B12 elevated.  TSH low but free T4 normal.  Dementia seems to be severe.  She has significant electrolyte derangement likely from dehydration.   Degenerative level is elevated as well.  Seems to be on gabapentin, Remeron and Atarax at home which increases her risk of polypharmacy.  She has no focal neurologic state to suggest CVA. -Treat treatable causes-possible UTI, dehydration and hypotension -Reorientation and delirium precautions Neurology consulted, discontinued Depakote Started Vimpat, recommended to consider starting clonazepam 1 mg p.o. twice daily if needed Continue Remeron 7.5 nightly and Atarax as needed -Resume gabapentin 100 twice daily at reduced dose   Urinary tract infection, mild hematuria: UA with large LE but rare bacteria and no nitrites.  Unfortunately urine culture was not sent.  Blood cultures NGTD. -Continue ceftriaxone empirically   Hypoglycemia/dehydration/hypotension/lactic acidosis: -s/p LR due to lactic acidosis. Continue D5 0.45 NS   -Discontinue metoprolol. -Check cortisol level and echocardiogram -Repeat lactic acid. -May add midodrine if blood pressure drops again   AKI: Likely prerenal from dehydration and poor p.o. intake.  Also Lasix and losartan at home.  CT renal stone study without acute finding.  CK within normal. Continue IV fluid for hydration Creatinine 1.5, improving -Closely monitor renal functions -Dose medications renally -Avoid nephrotoxic meds -Strict intake and output.   Normocytic anemia/thrombocytopenia: Notable drop in Hgb.  No obvious bleeding.  Hemodilution?  Folic acid low. -Hb [id number] 8.5 s/p  transfusing 1 unit -Iron profile and B12 within normal range Folic acid deficiency, folate level 3.0, started oral folate supplement. -Monitor H&H -Hold subcu heparin and Plavix for now -Check FOBT, haptoglobin, LDH, coag labs, LFT and HIT antibody. -Monitor H&H and transfuse if hemoglobin less than 7     Chronic HFrEF/DCM/severe PAH s/p AICD: Stable.  Dehydrated on presentation.  TTE 10/2021 with LVEF of 40 to 45%, GH, indeterminate DD and severe PASP -Hold diuretics and  losartan -Hold metoprolol given hypotension -Recheck echocardiogram -Closely monitor fluid and respiratory status while on IV fluid   Hyperkalemia/hyponatremia: Resolved.   Hypokalemia/hypomagnesemia/hypocalcemia -Monitor and replenish as appropriate. -IV fluid as above.   Vitamin D deficiency -Vitamin D 50,000 units weekly    Elevated digoxin levels -Continue holding digoxin Digoxin level 3.2->1.9, trended down   History of seizure disorder:  Patient was on Depakote which has been discontinued.   Depakote level 40, below normal range Neurology consulted, started Vimpat, recommended to consider clonazepam 1 mg p.o. twice daily if needed.   Watch for any seizure activity, continue seizure precautions   Chronic respiratory failure on 4 L oxygen at baseline  Continue supplemental O2 admission Continue home bronchodilators prn   Dementia with behavioral disturbance:  -Continue donepezil and memantine -See AMS above   Depression: Stable. -Continue hydroxyzine and mirtazapine.  No longer on Effexor.   Hypothyroidism: TSH low but free T4 normal. -Continue home Synthroid   Physical deconditioning -PT/OT     Moderate malnutrition Body mass index is 23.03 kg/m. Nutrition Problem: Moderate Malnutrition Etiology: chronic illness (dementia) Signs/Symptoms: mild fat depletion, moderate fat depletion, mild muscle depletion, moderate muscle depletion, percent weight loss Interventions: Magic cup, MVI, Prostat   Diet: Dysphagia 2 diet DVT Prophylaxis: SCD, pharmacological prophylaxis contraindicated due to thrombocytopenia    Advance goals of care discussion: Full code  Family Communication: family was present at bedside, at the time of interview.  The pt provided permission to discuss medical plan with the family. Opportunity was given to ask question and all questions were answered satisfactorily.  3/10 Discussed with patient's spouse at bedside.  Disposition:  Pt is  from Home, admitted with AMS, still has AMS, which precludes a safe discharge. Discharge to Home, when stable.  Subjective: No significant events overnight, patient is oriented to herself, did not even tell me her birthday.  Patient was resting comfortably, without any acute distress.  Patient's husband was at the bedside, above management plan discussed.   Physical Exam: General: NAD, lying comfortably Appear in no distress, affect appropriate Eyes: PERRLA ENT: Oral Mucosa Clear, moist  Neck: no JVD,  Cardiovascular: S1 and S2 Present, no Murmur,  Respiratory: good respiratory effort, Bilateral Air entry equal and Decreased, no Crackles, no wheezes Abdomen: Bowel Sound present, Soft and no tenderness,  Skin: no rashes Extremities: no Pedal edema, no calf tenderness Neurologic: without any new focal findings Gait not checked due to patient safety concerns  Vitals:   04/13/22 2329 04/14/22 0336 04/14/22 0825 04/14/22 1223  BP: 125/62 (!) 119/54 137/66 (!) 115/55  Pulse: 85 91 88 83  Resp: 15 16 (!) 23 14  Temp: 98.3 F (36.8 C) 98.1 F (36.7 C) 98.1 F (36.7 C) 98.3 F (36.8 C)  TempSrc: Axillary Axillary Axillary Axillary  SpO2: 100% 100% 100%   Weight:      Height:        Intake/Output Summary (Last 24 hours) at 04/14/2022 1627 Last data filed at 04/14/2022 0413 Gross per 24 hour  Intake --  Output 750 ml  Net -750 ml   Filed Weights   04/11/22 1237 04/11/22 1832  Weight: 62 kg 59 kg    Data Reviewed: I have personally reviewed and interpreted daily labs, tele strips, imagings as discussed above. I reviewed all nursing notes, pharmacy notes, vitals, pertinent old records I have discussed plan of care  as described above with RN and patient/family.  CBC: Recent Labs  Lab 04/11/22 1252 04/12/22 0444 04/13/22 0059 04/13/22 0334 04/13/22 1350 04/14/22 0446  WBC 9.3 9.4 8.3 8.3  --  6.1  NEUTROABS 6.5  --  4.5  --   --   --   HGB 10.1* 8.5* 6.8* 6.7* 8.7* 8.5*   HCT 34.5* 29.0* 22.6* 22.5* 28.0* 26.8*  MCV 82.3 82.4 80.7 80.4  --  80.2  PLT 141* PLATELET CLUMPS NOTED ON SMEAR, UNABLE TO ESTIMATE 65* 64*  --  53*   Basic Metabolic Panel: Recent Labs  Lab 04/11/22 1905 04/12/22 0154 04/12/22 0444 04/12/22 1046 04/12/22 2139 04/13/22 0059 04/13/22 0334 04/14/22 0446 04/14/22 1037  NA  --    < > 148*  --   --  140 140 143 144  K 5.4*   < > 4.9   < > 4.2 3.7 3.4* 5.9* 4.3  CL  --    < > 117*  --   --  108 109 117* 114*  CO2  --    < > 22  --   --  '23 22 22 25  '$ GLUCOSE  --    < > 144*  --   --  74 124* 299* 97  BUN  --    < > 55*  --   --  44* 44* 30* 28*  CREATININE  --    < > 3.20*  --   --  2.40* 2.28* 1.50* 1.44*  CALCIUM  --    < > 9.1  --   --  8.2* 8.2* 7.5* 8.0*  MG 2.0  --   --   --   --  1.4* 1.4* 2.2  --   PHOS  --   --   --   --   --  2.5  --  1.8*  --    < > = values in this interval not displayed.    Studies: No results found.  Scheduled Meds:  (feeding supplement) PROSource Plus  30 mL Oral TID BM   allopurinol  50 mg Oral Daily   atorvastatin  80 mg Oral QHS   donepezil  10 mg Oral QHS   famotidine  20 mg Oral Daily   folic acid  1 mg Oral Daily   gabapentin  100 mg Oral BID   lacosamide  100 mg Oral BID   levothyroxine  100 mcg Oral QAC breakfast   memantine  10 mg Oral BID   midodrine  5 mg Oral TID WC   mirtazapine  7.5 mg Oral QHS   mometasone-formoterol  2 puff Inhalation BID   multivitamin with minerals  1 tablet Oral Daily   Vitamin D (Ergocalciferol)  50,000 Units Oral Q7 days   Continuous Infusions:  cefTRIAXone (ROCEPHIN)  IV 2 g (04/14/22 1517)   dextrose 5 % and 0.45% NaCl 75 mL/hr at 04/14/22 0618   sodium phosphate 30 mmol in dextrose 5 % 250 mL infusion 30 mmol (04/14/22 1115)   PRN Meds: acetaminophen **OR** acetaminophen, hydrOXYzine, ipratropium-albuterol, ondansetron (ZOFRAN) IV, polyvinyl alcohol, senna-docusate, traZODone  Time spent: 35 minutes  Author: Val Riles. MD [facility] 04/14/2022 4:27 PM  To reach On-call, see care teams to locate the attending and reach out to them via [email or web address]. If 7PM-7AM, please contact night-coverage If you still have difficulty reaching the attending provider, please page the [facility] (Director on Call) for [facility] on amion for assistance.

## 2022-04-14 NOTE — Progress Notes (Signed)
       CROSS COVER NOTE  NAME: AYSHIA GRAMLICH MRN: [id number] DOB : 1950-11-22    HPI/Events of Note   Report: Potassium 5.9 - IVF D5NS with 20 KCL continuous at 75 On review of chart: Sodium 143, serum glucose 299, creat 1.5, ohos 1.8 , Ca 7.5, Alb 1.7   Assessment and  Interventions   Assessment:  Plan: Change IV fluids D51/2 NS at 75 Recheck potassium level at 1100 Sodium phosphate 30 mmol - recheck phos in am tomorrow     Kathlene Cote NP [facility]

## 2022-04-14 NOTE — Progress Notes (Signed)
OT Cancellation Note  Patient Details Name: Stacy Grimes MRN: [id number] DOB: Jul 27, 1950   Cancelled Treatment:    Reason Eval/Treat Not Completed: Patient at procedure or test/ unavailable (RN in room attempting IV access). Per TOC notes, spouse is declining SNF recommendation (made yesterday by PT) and pt will go home via ambulance transport with HHRN/PT/OT services to the care of spouse.  OT to continue to attempt evaluation.   Vania Rea 04/14/2022, 1:36 PM

## 2022-04-15 ENCOUNTER — Inpatient Hospital Stay (HOSPITAL_COMMUNITY)
Admit: 2022-04-15 | Discharge: 2022-04-15 | Disposition: A | Discharge status: Home / Self Care | Payer: Medicare HMO | Attending: Student | Admitting: Student

## 2022-04-15 DIAGNOSIS — R410 Disorientation, unspecified: Secondary | ICD-10-CM | POA: Diagnosis not present

## 2022-04-15 DIAGNOSIS — I9589 Other hypotension: Secondary | ICD-10-CM

## 2022-04-15 LAB — BASIC METABOLIC PANEL
Anion gap: 7 (ref 5–15)
BUN: 24 mg/dL — ABNORMAL HIGH (ref 8–23)
CO2: 21 mmol/L — ABNORMAL LOW (ref 22–32)
Calcium: 7.7 mg/dL — ABNORMAL LOW (ref 8.9–10.3)
Chloride: 113 mmol/L — ABNORMAL HIGH (ref 98–111)
Creatinine, Ser: 1.17 mg/dL — ABNORMAL HIGH (ref 0.44–1.00)
GFR, Estimated: 50 mL/min — ABNORMAL LOW (ref >60)
Glucose, Bld: 83 mg/dL (ref 70–99)
Potassium: 4.2 mmol/L (ref 3.5–5.1)
Sodium: 141 mmol/L (ref 135–145)

## 2022-04-15 LAB — GLUCOSE, CAPILLARY
Glucose-Capillary: 114 mg/dL — ABNORMAL HIGH (ref 70–99)
Glucose-Capillary: 79 mg/dL (ref 70–99)
Glucose-Capillary: 83 mg/dL (ref 70–99)
Glucose-Capillary: 83 mg/dL (ref 70–99)
Glucose-Capillary: 86 mg/dL (ref 70–99)

## 2022-04-15 LAB — HAPTOGLOBIN: Haptoglobin: 105 mg/dL (ref 42–346)

## 2022-04-15 LAB — ECHOCARDIOGRAM COMPLETE
AR max vel: 2.76 cm2
AV Area VTI: 2.12 cm2
AV Area mean vel: 2.53 cm2
AV Mean grad: 2 mmHg
AV Peak grad: 5.1 mmHg
Ao pk vel: 1.13 m/s
Area-P 1/2: 5.27 cm2
Height: 63 in
MV VTI: 1.79 cm2
S' Lateral: 2.9 cm
Weight: 2080 oz

## 2022-04-15 LAB — CBC
HCT: 30 % — ABNORMAL LOW (ref 36.0–46.0)
Hemoglobin: 9.1 g/dL — ABNORMAL LOW (ref 12.0–15.0)
MCH: 25 pg — ABNORMAL LOW (ref 26.0–34.0)
MCHC: 30.3 g/dL (ref 30.0–36.0)
MCV: 82.4 fL (ref 80.0–100.0)
Platelets: 51 10*3/uL — ABNORMAL LOW (ref 150–400)
RBC: 3.64 MIL/uL — ABNORMAL LOW (ref 3.87–5.11)
RDW: 20.6 % — ABNORMAL HIGH (ref 11.5–15.5)
WBC: 6.6 10*3/uL (ref 4.0–10.5)
nRBC: 0 % (ref 0.0–0.2)

## 2022-04-15 LAB — MAGNESIUM: Magnesium: 2.1 mg/dL (ref 1.7–2.4)

## 2022-04-15 LAB — PHOSPHORUS: Phosphorus: 4.6 mg/dL (ref 2.5–4.6)

## 2022-04-15 MED ORDER — METOPROLOL TARTRATE 5 MG/5ML IV SOLN: 5.0000 mg | Freq: Once | INTRAVENOUS | Status: DC

## 2022-04-15 MED ORDER — METOPROLOL TARTRATE 5 MG/5ML IV SOLN
5.0000 mg | Freq: Once | INTRAVENOUS | Status: AC | PRN
  Administered 2022-04-15: 5 mg via INTRAVENOUS
  Filled 2022-04-15: qty 5

## 2022-04-15 NOTE — Evaluation (Addendum)
Occupational Therapy Evaluation Patient Details Name: Stacy Grimes MRN: [id number] DOB: [birth year] Today's Date: 04/15/2022   History of Present Illness Pt is a 72 year old female with history of stroke, dementia, COPD on chronic 4 L nasal cannula,  cardiomyopathy, seizure disorder, PE, and severe PAH s/p AICD who presents to the emergency department for chief concerns of altered mental status. MD assessment includes: altered mental status, UTI, mild hematuria,  hypoglycemia, dehydration, hypotension, lactic acidosis, AKI, normocytic anemia, thrombocytopenia, hyperkalemia, hyponatremia, hypomagnesemia, hypocalcemia, focal seizures with secondary generalization, and physical deconditioning.   Clinical Impression   Stacy Grimes was seen for OT evaluation this date. Prior to hospital admission, pt was requiring assist for ADLs/mobility. Pt lives with spouse. Pt presents to acute OT demonstrating impaired ADL performance and functional mobility 2/2 decreased activity tolerance and functional strength/ROM/balance deficits. Pt currently requires SETUP self-drinking bed level, resting BUE tremors noted, improved with activity. TOTAL A rolling bed level, HR 135 bpm with bed activity. RN at bedside aware. Pt would benefit from skilled OT to address noted impairments and functional limitations (see below for any additional details). Upon hospital discharge, recommend [facility] with 24/7 SUPERVISION.    Recommendations for follow up therapy are one component of a multi-disciplinary discharge planning process, led by the attending physician.  Recommendations may be updated based on patient status, additional functional criteria and insurance authorization.   Follow Up Recommendations  Home health OT     Assistance Recommended at Discharge Frequent or constant Supervision/Assistance  Patient can return home with the following Two people to help with walking and/or transfers;Two people to help with  bathing/dressing/bathroom;Help with stairs or ramp for entrance    Functional Status Assessment  Patient has had a recent decline in their functional status and demonstrates the ability to make significant improvements in function in a reasonable and predictable amount of time.  Equipment Recommendations  Other (comment) (tray table)    Recommendations for Other Services       Precautions / Restrictions Precautions Precautions: Fall Restrictions Weight Bearing Restrictions: No Other Position/Activity Restrictions: h/o seizures      Mobility Bed Mobility Overal bed mobility: Needs Assistance Bed Mobility: Rolling Rolling: Total assist              Transfers                   General transfer comment: Unable/unsafe to attempt          ADL either performed or assessed with clinical judgement   ADL Overall ADL's : Needs assistance/impaired                                       General ADL Comments: SETUP self-drinking bed level. TOTAL A simulated bed level toileting      Pertinent Vitals/Pain Pain Assessment Pain Assessment: Faces Faces Pain Scale: Hurts even more Pain Location: back Pain Descriptors / Indicators: Aching, Grimacing Pain Intervention(s): Limited activity within patient's tolerance, Repositioned     Hand Dominance Right   Extremity/Trunk Assessment Upper Extremity Assessment Upper Extremity Assessment: Generalized weakness (BUE resting tremors)   Lower Extremity Assessment Lower Extremity Assessment: Generalized weakness       Communication Communication Communication: HOH   Cognition Arousal/Alertness: Awake/alert Behavior During Therapy: WFL for tasks assessed/performed Overall Cognitive Status: Impaired/Different from baseline Area of Impairment: Following commands  Following Commands: Follows one step commands with increased time              Home Living  Family/patient expects to be discharged to:: Private residence Living Arrangements: Spouse/significant other Available Help at Discharge: Family;Available 24 hours/day Type of Home: Mobile home Home Access: Ramped entrance     Home Layout: One level     Bathroom Shower/Tub: Teacher, early years/pre: Handicapped height Bathroom Accessibility: Yes   Home Equipment: Conservation officer, nature (2 wheels);BSC/3in1;Rollator (4 wheels);Cane - single point   Additional Comments: History from chart review, spouse at bedside confirms no changes. spouse and grandson assist at needed      Prior Functioning/Environment Prior Level of Function : Needs assist               ADLs Comments: PRN assist for pericare, use of depends        OT Problem List: Decreased strength;Decreased range of motion;Decreased activity tolerance;Impaired balance (sitting and/or standing);Decreased safety awareness      OT Treatment/Interventions: Self-care/ADL training;Therapeutic exercise;Energy conservation;DME and/or AE instruction;Therapeutic activities;Balance training;Patient/family education    OT Goals(Current goals can be found in the care plan section) Acute Rehab OT Goals Patient Stated Goal: to go home OT Goal Formulation: With family Time For Goal Achievement: 04/29/22 Potential to Achieve Goals: Fair ADL Goals Pt Will Perform Eating: Independently;bed level Pt Will Perform Grooming: with min guard assist;sitting Pt Will Transfer to Toilet: with mod assist (rolling bed level)  OT Frequency: Min 1X/week    Co-evaluation              AM-PAC OT "6 Clicks" Daily Activity     Outcome Measure Help from another person eating meals?: None Help from another person taking care of personal grooming?: A Lot Help from another person toileting, which includes using toliet, bedpan, or urinal?: Total Help from another person bathing (including washing, rinsing, drying)?: Total Help from another  person to put on and taking off regular upper body clothing?: A Lot Help from another person to put on and taking off regular lower body clothing?: Total 6 Click Score: 11   End of Session    Activity Tolerance: Patient tolerated treatment well Patient left: in bed;with call bell/phone within reach;with nursing/sitter in room;with family/visitor present  OT Visit Diagnosis: Other abnormalities of gait and mobility (R26.89)                Time: [id number] OT Time Calculation (min): 10 min Charges:  OT General Charges $OT Visit: 1 Visit OT Evaluation $OT Eval Low Complexity: 1 Low  Dessie Coma, M.S. OTR/L  04/15/22, 2:03 PM  ascom [phone]

## 2022-04-15 NOTE — Progress Notes (Signed)
*  PRELIMINARY RESULTS* Echocardiogram 2D Echocardiogram has been performed.  Stacy Grimes 04/15/2022, 9:42 AM

## 2022-04-15 NOTE — Progress Notes (Signed)
No ICM remote transmission received for 04/15/2022 due to currently hospitalized and next ICM transmission scheduled for 04/29/2022.

## 2022-04-15 NOTE — Progress Notes (Signed)
[facility] Progress Note  Patient: Stacy Grimes    [id number]  DOA: 04/11/2022     Date of Service: the patient was seen and examined on 04/15/2022  Chief Complaint  Patient presents with   Altered Mental Status   Brief hospital course: 72 year old F with PMH of dementia, COPD/chronic hypoxic RF on 4 L by [place], CVA, HFrEF with AICD, HTN, anxiety and seizure disorder presenting with altered mental status for 3 days in the setting of possible polypharmacy, dehydration and possible UTI.  Also noted to have hypocalcemia with lactic acidosis, AKI hyponatremia and hyperkalemia.  Digoxin level elevated.  CT head and CXR without acute finding.  Received IV fluid, IV calcium and treatment for hyperkalemia.  Started on IV antibiotics for possible UTI.  Neurology consulted and adjusted medications to minimize polypharmacy    Assessment and Plan: Principal Problem:   Altered mental status Active Problems:   AKI (acute kidney injury) ([place])   Chronic systolic CHF (congestive heart failure) (HCC)   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   Chronic obstructive pulmonary disease, unspecified COPD type ([place])   Chronic respiratory failure with hypoxia (H[facility])   Dementia ([place])   Hypertension   Benign neoplasm of colon   Seizures ([place])   Status epilepticus ([place])   Protein calorie malnutrition ([place])   Hypothyroidism   Depression   ICD (implantable cardioverter-defibrillator) in place   Hyperkalemia   Malnutrition of moderate degree  Altered mental status: Multifactorial including dehydration, AKI/azotemia, possible UTI, polypharmacy, underlying dementia.  UA with large LE but rare bacteria and no nitrite.  Unfortunately, urine culture was not sent.  Blood cultures NGTD.  CT head and CXR without acute finding.  Ammonia normal.  B12 elevated.  TSH low but free T4 normal.  Dementia seems to be severe.  She has significant electrolyte derangement likely from dehydration.   Degenerative level is elevated as well.  Seems to be on gabapentin, Remeron and Atarax at home which increases her risk of polypharmacy.  She has no focal neurologic state to suggest CVA. -Treat treatable causes-possible UTI, dehydration and hypotension -Reorientation and delirium precautions Neurology consulted, discontinued Depakote Started Vimpat, recommended to consider starting clonazepam 1 mg p.o. twice daily if needed Continue Remeron 7.5 nightly and Atarax as needed -Resume gabapentin 100 twice daily at reduced dose   Urinary tract infection, mild hematuria: UA with large LE but rare bacteria and no nitrites.  Unfortunately urine culture was not sent.  Blood cultures NGTD. -Continue ceftriaxone empirically   Hypoglycemia/dehydration/hypotension/lactic acidosis: -s/p LR due to lactic acidosis. Continue D5 0.45 NS   -Discontinue metoprolol. -Check cortisol level and echocardiogram -Repeat lactic acid. -May add midodrine if blood pressure drops again   AKI: Likely prerenal from dehydration and poor p.o. intake.  Also Lasix and losartan at home.  CT renal stone study without acute finding.  CK within normal. Continue IV fluid for hydration Creatinine 1.5, improving -Closely monitor renal functions -Dose medications renally -Avoid nephrotoxic meds -Strict intake and output.   Normocytic anemia/thrombocytopenia: Notable drop in Hgb.  No obvious bleeding.  Hemodilution?  Folic acid low. -Hb [id number] 8.5 s/p  transfusing 1 unit.   Hb 9.1 -Iron profile and B12 within normal range Folic acid deficiency, folate level 3.0, started oral folate supplement. -Monitor H&H -Hold subcu heparin and Plavix for now Platelet count 51 -Check FOBT, Haptoglobin is in process LDH 200,  coag labs in am LFT wnl  HIT antibody in process -Monitor H&H and transfuse if hemoglobin less  than 7 Hematologist consulted due to thrombocytopenia   Chronic HFrEF/DCM/severe PAH s/p AICD: Stable.  Dehydrated  on presentation.  TTE 10/2021 with LVEF of 40 to 45%, GH, indeterminate DD and severe PASP -Hold diuretics and losartan -Hold metoprolol given hypotension 04/15/2022 TTE shows LVEF 50 to 55%, no any other significant findings -Closely monitor fluid and respiratory status while on IV fluid 3/11 Lopressor 5 mg IV one-time dose given due to persistent tachycardia  Hyperkalemia/hyponatremia: Resolved.   Hypokalemia/hypomagnesemia/hypocalcemia -Monitor and replenish as appropriate. -IV fluid as above.   Vitamin D deficiency -Vitamin D 50,000 units weekly    Elevated digoxin levels -Continue holding digoxin Digoxin level 3.2->1.9, trended down   History of seizure disorder:  Patient was on Depakote which has been discontinued.   Depakote level 40, below normal range Neurology consulted, started Vimpat 100 mg po bid, recommended to consider clonazepam 1 mg p.o. twice daily if needed.   Watch for any seizure activity, continue seizure precautions   Chronic respiratory failure on 4 L oxygen at baseline  Continue supplemental O2 admission Continue home bronchodilators prn   Dementia with behavioral disturbance:  -Continue donepezil and memantine -See AMS above   Depression: Stable. -Continue hydroxyzine and mirtazapine.  No longer on Effexor.   Hypothyroidism: TSH low but free T4 normal. -Continue home Synthroid   Physical deconditioning -PT/OT      Moderate malnutrition Body mass index is 23.03 kg/m. Nutrition Problem: Moderate Malnutrition Etiology: chronic illness (dementia) Signs/Symptoms: mild fat depletion, moderate fat depletion, mild muscle depletion, moderate muscle depletion, percent weight loss Interventions: Magic cup, MVI, Prostat   Diet: Dysphagia 2 diet DVT Prophylaxis: SCD, pharmacological prophylaxis contraindicated due to thrombocytopenia    Advance goals of care discussion: Full code  Family Communication: family was present at bedside, at the time  of interview.  The pt provided permission to discuss medical plan with the family. Opportunity was given to ask question and all questions were answered satisfactorily.  3/10 Discussed with patient's spouse at bedside.  Disposition:  Pt is from Home, admitted with AMS, still has AMS, which precludes a safe discharge. Discharge to Home, when stable.  Subjective: No significant events overnight, patient was more awake and alert today as compared to yesterday.  Patient is AAO x 1 was able to tell me her name but does not remember her birthday.  It seems she is back to her baseline.  Intermittently she is getting confused.  Patient still has mittens. Patient said that her tailbone is hurting, patient was advised to turn frequently and asked nurse to help her to turn frequently.  She denies any other complaints.    Physical Exam: General: NAD, lying comfortably Appear in no distress, affect appropriate Eyes: PERRLA ENT: Oral Mucosa Clear, moist  Neck: no JVD,  Cardiovascular: S1 and S2 Present, no Murmur,  Respiratory: good respiratory effort, Bilateral Air entry equal and Decreased, no Crackles, no wheezes Abdomen: Bowel Sound present, Soft and no tenderness,  Skin: no rashes Extremities: no Pedal edema, no calf tenderness Neurologic: without any new focal findings Gait not checked due to patient safety concerns  Vitals:   04/14/22 2341 04/15/22 0348 04/15/22 1157 04/15/22 1357  BP: (!) 125/55 (!) 120/47 (!) 109/58 (!) 103/56  Pulse: 84 86 (!) 119 93  Resp: '19 17 17 19  '$ Temp: 97.6 F (36.4 C) 97.7 F (36.5 C) 98.2 F (36.8 C)   TempSrc: Oral Axillary Oral   SpO2: 99% 100% 99% 100%  Weight:  Height:        Intake/Output Summary (Last 24 hours) at 04/15/2022 1507 Last data filed at 04/15/2022 1042 Gross per 24 hour  Intake 1936.49 ml  Output 550 ml  Net 1386.49 ml   Filed Weights   04/11/22 1237 04/11/22 1832  Weight: 62 kg 59 kg    Data Reviewed: I have personally  reviewed and interpreted daily labs, tele strips, imagings as discussed above. I reviewed all nursing notes, pharmacy notes, vitals, pertinent old records I have discussed plan of care as described above with RN and patient/family.  CBC: Recent Labs  Lab 04/11/22 1252 04/12/22 0444 04/13/22 0059 04/13/22 0334 04/13/22 1350 04/14/22 0446 04/15/22 0443  WBC 9.3 9.4 8.3 8.3  --  6.1 6.6  NEUTROABS 6.5  --  4.5  --   --   --   --   HGB 10.1* 8.5* 6.8* 6.7* 8.7* 8.5* 9.1*  HCT 34.5* 29.0* 22.6* 22.5* 28.0* 26.8* 30.0*  MCV 82.3 82.4 80.7 80.4  --  80.2 82.4  PLT 141* PLATELET CLUMPS NOTED ON SMEAR, UNABLE TO ESTIMATE 65* 64*  --  53* 51*   Basic Metabolic Panel: Recent Labs  Lab 04/11/22 1905 04/12/22 0154 04/13/22 0059 04/13/22 0334 04/14/22 0446 04/14/22 1037 04/15/22 0443  NA  --    < > 140 140 143 144 141  K 5.4*   < > 3.7 3.4* 5.9* 4.3 4.2  CL  --    < > 108 109 117* 114* 113*  CO2  --    < > '23 22 22 25 '$ 21*  GLUCOSE  --    < > 74 124* 299* 97 83  BUN  --    < > 44* 44* 30* 28* 24*  CREATININE  --    < > 2.40* 2.28* 1.50* 1.44* 1.17*  CALCIUM  --    < > 8.2* 8.2* 7.5* 8.0* 7.7*  MG 2.0  --  1.4* 1.4* 2.2  --  2.1  PHOS  --   --  2.5  --  1.8*  --  4.6   < > = values in this interval not displayed.    Studies: ECHOCARDIOGRAM COMPLETE  Result Date: 04/15/2022    ECHOCARDIOGRAM REPORT   Patient Name:   CORRYN LOTSPEICH [facility] Date of Exam: 04/15/2022 Medical Rec #:  [id number]        Height:       63.0 in Accession #:    [id number]       Weight:       130.0 lb Date of Birth:  [birth year]         BSA:          1.610 m Patient Age:    24 years         BP:           120/47 mmHg Patient Gender: F                HR:           110 bpm. Exam Location:  [facility] Procedure: 2D Echo, Cardiac Doppler and Color Doppler Indications:     Hypotension  History:         Patient has prior history of Echocardiogram examinations, most                  recent 10/23/2021. CHF and Cardiomyopathy, Defibrillator,  Stroke, Arrythmias:Tachycardia, Signs/Symptoms:Altered Mental                  Status; Risk Factors:Hypertension and Diabetes. Patient with                  Dementia is unable to follow instructions.  Sonographer:     Wenda Low Referring Phys:  [id number] Charlesetta Ivory GONFA Diagnosing Phys: Kathlyn Sacramento MD  Sonographer Comments: Technically difficult study due to poor echo windows and suboptimal apical window. Image acquisition challenging due to uncooperative patient. IMPRESSIONS  1. Left ventricular ejection fraction, by estimation, is 50 to 55%. The left ventricle has low normal function. Left ventricular endocardial border not optimally defined to evaluate regional wall motion. Left ventricular diastolic parameters are indeterminate.  2. Right ventricular systolic function is normal. The right ventricular size is normal. Tricuspid regurgitation signal is inadequate for assessing PA pressure.  3. The mitral valve is normal in structure. No evidence of mitral valve regurgitation. No evidence of mitral stenosis.  4. The aortic valve is normal in structure. Aortic valve regurgitation is not visualized. Aortic valve sclerosis/calcification is present, without any evidence of aortic stenosis.  5. The inferior vena cava is normal in size with <50% respiratory variability, suggesting right atrial pressure of 8 mmHg. FINDINGS  Left Ventricle: Left ventricular ejection fraction, by estimation, is 50 to 55%. The left ventricle has low normal function. Left ventricular endocardial border not optimally defined to evaluate regional wall motion. The left ventricular internal cavity  size was normal in size. There is no left ventricular hypertrophy. Left ventricular diastolic parameters are indeterminate. Right Ventricle: The right ventricular size is normal. No increase in right ventricular wall thickness. Right ventricular systolic function is normal. Tricuspid regurgitation signal is inadequate for assessing  PA pressure. The tricuspid regurgitant velocity is 2.78 m/s, and with an assumed right atrial pressure of 8 mmHg, the estimated right ventricular systolic pressure is [id number]. Left Atrium: Left atrial size was normal in size. Right Atrium: Right atrial size was normal in size. Pericardium: There is no evidence of pericardial effusion. Mitral Valve: The mitral valve is normal in structure. No evidence of mitral valve regurgitation. No evidence of mitral valve stenosis. MV peak gradient, 7.4 mmHg. The mean mitral valve gradient is 2.0 mmHg. Tricuspid Valve: The tricuspid valve is normal in structure. Tricuspid valve regurgitation is trivial. No evidence of tricuspid stenosis. Aortic Valve: The aortic valve is normal in structure. Aortic valve regurgitation is not visualized. Aortic valve sclerosis/calcification is present, without any evidence of aortic stenosis. Aortic valve mean gradient measures 2.0 mmHg. Aortic valve peak  gradient measures 5.1 mmHg. Aortic valve area, by VTI measures 2.12 cm. Pulmonic Valve: The pulmonic valve was normal in structure. Pulmonic valve regurgitation is not visualized. No evidence of pulmonic stenosis. Aorta: The aortic root is normal in size and structure. Venous: The inferior vena cava is normal in size with less than 50% respiratory variability, suggesting right atrial pressure of 8 mmHg. IAS/Shunts: No atrial level shunt detected by color flow Doppler. Additional Comments: A device lead is visualized.  LEFT VENTRICLE PLAX 2D LVIDd:         3.70 cm   Diastology LVIDs:         2.90 cm   LV e' medial:   12.90 cm/s LV PW:         1.00 cm   LV E/e' medial: 9.6 LV IVS:        0.90 cm LVOT  diam:     1.90 cm LV SV:         29 LV SV Index:   18 LVOT Area:     2.84 cm  LEFT ATRIUM         Index LA diam:    2.30 cm 1.43 cm/m  AORTIC VALVE                    PULMONIC VALVE AV Area (Vmax):    2.76 cm     PV Vmax:       1.05 m/s AV Area (Vmean):   2.53 cm     PV Peak grad:  4.4 mmHg AV  Area (VTI):     2.12 cm AV Vmax:           113.00 cm/s AV Vmean:          61.400 cm/s AV VTI:            0.135 m AV Peak Grad:      5.1 mmHg AV Mean Grad:      2.0 mmHg LVOT Vmax:         110.00 cm/s LVOT Vmean:        54.700 cm/s LVOT VTI:          0.101 m LVOT/AV VTI ratio: 0.75  AORTA Ao Root diam: 2.90 cm MITRAL VALVE                TRICUSPID VALVE MV Area (PHT): 5.27 cm     TR Peak grad:   30.9 mmHg MV Area VTI:   1.79 cm     TR Vmax:        278.00 cm/s MV Peak grad:  7.4 mmHg MV Mean grad:  2.0 mmHg     SHUNTS MV Vmax:       1.36 m/s     Systemic VTI:  0.10 m MV Vmean:      64.4 cm/s    Systemic Diam: 1.90 cm MV Decel Time: 144 msec MV E velocity: 124.00 cm/s Kathlyn Sacramento MD Electronically signed by Kathlyn Sacramento MD Signature Date/Time: 04/15/2022/2:28:38 PM    Final     Scheduled Meds:  (feeding supplement) PROSource Plus  30 mL Oral TID BM   allopurinol  50 mg Oral Daily   atorvastatin  80 mg Oral QHS   donepezil  10 mg Oral QHS   famotidine  20 mg Oral Daily   folic acid  1 mg Oral Daily   gabapentin  100 mg Oral BID   lacosamide  100 mg Oral BID   levothyroxine  100 mcg Oral QAC breakfast   memantine  10 mg Oral BID   midodrine  5 mg Oral TID WC   mirtazapine  7.5 mg Oral QHS   mometasone-formoterol  2 puff Inhalation BID   multivitamin with minerals  1 tablet Oral Daily   Vitamin D (Ergocalciferol)  50,000 Units Oral Q7 days   Continuous Infusions:  cefTRIAXone (ROCEPHIN)  IV Stopped (04/14/22 1547)   dextrose 5 % and 0.45% NaCl 75 mL/hr at 04/14/22 1930   PRN Meds: acetaminophen **OR** acetaminophen, hydrOXYzine, ipratropium-albuterol, ondansetron (ZOFRAN) IV, polyvinyl alcohol, senna-docusate, traZODone  Time spent: 35 minutes  Author: Val Riles. MD [facility] 04/15/2022 3:07 PM  To reach On-call, see care teams to locate the attending and reach out to them via [email or web address]. If 7PM-7AM, please contact night-coverage If you still have difficulty reaching  the attending provider, please page the  DOC (Director on Call) for [facility] on amion for assistance.

## 2022-04-15 NOTE — Plan of Care (Signed)

## 2022-04-16 DIAGNOSIS — R4182 Altered mental status, unspecified: Secondary | ICD-10-CM | POA: Diagnosis not present

## 2022-04-16 DIAGNOSIS — F03918 Unspecified dementia, unspecified severity, with other behavioral disturbance: Secondary | ICD-10-CM | POA: Diagnosis not present

## 2022-04-16 DIAGNOSIS — D696 Thrombocytopenia, unspecified: Secondary | ICD-10-CM | POA: Diagnosis not present

## 2022-04-16 DIAGNOSIS — Z95810 Presence of automatic (implantable) cardiac defibrillator: Secondary | ICD-10-CM | POA: Diagnosis not present

## 2022-04-16 DIAGNOSIS — Z515 Encounter for palliative care: Secondary | ICD-10-CM

## 2022-04-16 DIAGNOSIS — N179 Acute kidney failure, unspecified: Secondary | ICD-10-CM | POA: Diagnosis not present

## 2022-04-16 DIAGNOSIS — R569 Unspecified convulsions: Secondary | ICD-10-CM

## 2022-04-16 DIAGNOSIS — R410 Disorientation, unspecified: Secondary | ICD-10-CM | POA: Diagnosis not present

## 2022-04-16 LAB — GLUCOSE, CAPILLARY
Glucose-Capillary: 64 mg/dL — ABNORMAL LOW (ref 70–99)
Glucose-Capillary: 85 mg/dL (ref 70–99)
Glucose-Capillary: 82 mg/dL (ref 70–99)
Glucose-Capillary: 71 mg/dL (ref 70–99)
Glucose-Capillary: 106 mg/dL — ABNORMAL HIGH (ref 70–99)
Glucose-Capillary: 107 mg/dL — ABNORMAL HIGH (ref 70–99)
Glucose-Capillary: 87 mg/dL (ref 70–99)

## 2022-04-16 LAB — BASIC METABOLIC PANEL
Anion gap: 8 (ref 5–15)
BUN: 18 mg/dL (ref 8–23)
CO2: 23 mmol/L (ref 22–32)
Calcium: 8.1 mg/dL — ABNORMAL LOW (ref 8.9–10.3)
Chloride: 111 mmol/L (ref 98–111)
Creatinine, Ser: 1.05 mg/dL — ABNORMAL HIGH (ref 0.44–1.00)
GFR, Estimated: 56 mL/min — ABNORMAL LOW (ref >60)
Glucose, Bld: 77 mg/dL (ref 70–99)
Potassium: 3.4 mmol/L — ABNORMAL LOW (ref 3.5–5.1)
Sodium: 142 mmol/L (ref 135–145)

## 2022-04-16 LAB — PHOSPHORUS: Phosphorus: 2.2 mg/dL — ABNORMAL LOW (ref 2.5–4.6)

## 2022-04-16 LAB — MAGNESIUM: Magnesium: 1.7 mg/dL (ref 1.7–2.4)

## 2022-04-16 LAB — CBC WITH DIFFERENTIAL/PLATELET
Abs Immature Granulocytes: 0.04 10*3/uL (ref 0.00–0.07)
Basophils Absolute: 0 10*3/uL (ref 0.0–0.1)
Basophils Relative: 1 %
Eosinophils Absolute: 0.4 10*3/uL (ref 0.0–0.5)
Eosinophils Relative: 5 %
HCT: 31.4 % — ABNORMAL LOW (ref 36.0–46.0)
Hemoglobin: 9.6 g/dL — ABNORMAL LOW (ref 12.0–15.0)
Immature Granulocytes: 1 %
Lymphocytes Relative: 14 %
Lymphs Abs: 1 10*3/uL (ref 0.7–4.0)
MCH: 25.5 pg — ABNORMAL LOW (ref 26.0–34.0)
MCHC: 30.6 g/dL (ref 30.0–36.0)
MCV: 83.5 fL (ref 80.0–100.0)
Monocytes Absolute: 0.8 10*3/uL (ref 0.1–1.0)
Monocytes Relative: 11 %
Neutro Abs: 5 10*3/uL (ref 1.7–7.7)
Neutrophils Relative %: 68 %
Platelets: 77 10*3/uL — ABNORMAL LOW (ref 150–400)
RBC: 3.76 MIL/uL — ABNORMAL LOW (ref 3.87–5.11)
RDW: 21.1 % — ABNORMAL HIGH (ref 11.5–15.5)
Smear Review: NORMAL
WBC: 7.2 10*3/uL (ref 4.0–10.5)
nRBC: 0 % (ref 0.0–0.2)

## 2022-04-16 LAB — TECHNOLOGIST SMEAR REVIEW: Plt Morphology: NORMAL

## 2022-04-16 LAB — PROTIME-INR
INR: 1.2 (ref 0.8–1.2)
Prothrombin Time: 15.1 seconds (ref 11.4–15.2)

## 2022-04-16 LAB — CULTURE, BLOOD (ROUTINE X 2)
Culture: NO GROWTH
Culture: NO GROWTH
Special Requests: ADEQUATE
Special Requests: ADEQUATE

## 2022-04-16 LAB — FIBRINOGEN: Fibrinogen: 456 mg/dL (ref 210–475)

## 2022-04-16 LAB — APTT: aPTT: 25 seconds (ref 24–36)

## 2022-04-16 MED ORDER — K PHOS MONO-SOD PHOS DI & MONO 155-852-130 MG PO TABS
500.0000 mg | ORAL_TABLET | Freq: Four times a day (QID) | ORAL | Status: AC
  Administered 2022-04-16: 500 mg via ORAL
  Filled 2022-04-16 (×3): qty 2

## 2022-04-16 MED ORDER — POTASSIUM CHLORIDE CRYS ER 20 MEQ PO TBCR: 40.0000 meq | EXTENDED_RELEASE_TABLET | Freq: Once | ORAL | Status: DC

## 2022-04-16 NOTE — Progress Notes (Signed)
[facility] Progress Note  Patient: Stacy Grimes    [id number]  DOA: 04/11/2022     Date of Service: the patient was seen and examined on 04/16/2022  Chief Complaint  Patient presents with   Altered Mental Status   Brief hospital course: 72 year old F with PMH of dementia, COPD/chronic hypoxic RF on 4 L by [place], CVA, HFrEF with AICD, HTN, anxiety and seizure disorder presenting with altered mental status for 3 days in the setting of possible polypharmacy, dehydration and possible UTI.  Also noted to have hypocalcemia with lactic acidosis, AKI hyponatremia and hyperkalemia.  Digoxin level elevated.  CT head and CXR without acute finding.  Received IV fluid, IV calcium and treatment for hyperkalemia.  Started on IV antibiotics for possible UTI.  Neurology consulted and adjusted medications to minimize polypharmacy    Assessment and Plan: Principal Problem:   Altered mental status Active Problems:   AKI (acute kidney injury) ([place])   Chronic systolic CHF (congestive heart failure) (HCC)   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   Chronic obstructive pulmonary disease, unspecified COPD type ([place])   Chronic respiratory failure with hypoxia (HCC)   Dementia ([place])   Hypertension   Benign neoplasm of colon   Seizures ([place])   Status epilepticus ([place])   Protein calorie malnutrition ([place])   Hypothyroidism   Depression   ICD (implantable cardioverter-defibrillator) in place   Hyperkalemia   Malnutrition of moderate degree  Altered mental status: Multifactorial including dehydration, AKI/azotemia, possible UTI, polypharmacy, underlying dementia.  UA with large LE but rare bacteria and no nitrite.  Unfortunately, urine culture was not sent.  Blood cultures NGTD.  CT head and CXR without acute finding.  Ammonia normal.  B12 elevated.  TSH low but free T4 normal.  Dementia seems to be severe.  She has significant electrolyte derangement likely from dehydration.   Degenerative level is elevated as well.  Seems to be on gabapentin, Remeron and Atarax at home which increases her risk of polypharmacy.  She has no focal neurologic state to suggest CVA. -Treat treatable causes-possible UTI, dehydration and hypotension -Reorientation and delirium precautions Neurology consulted, discontinued Depakote Started Vimpat, recommended to consider starting clonazepam 1 mg p.o. twice daily if needed Continue Remeron 7.5 nightly and Atarax as needed -Resume gabapentin 100 twice daily at reduced dose    History of seizure disorder:  Patient was on Depakote which has been discontinued.   Depakote level 40, below normal range Neurology consulted, started Vimpat 100 mg po bid, recommended to consider clonazepam 1 mg p.o. twice daily if needed.   Watch for any seizure activity, continue seizure precautions 3/12 neurology will evaluate patient again and make changes if needed   Urinary tract infection, mild hematuria: UA with large LE but rare bacteria and no nitrites.  Unfortunately urine culture was not sent.  Blood cultures NGTD. -s/p ceftriaxone empirically given for 6 days, DC'd on 3/12 due to thrombocytopenia    Hypoglycemia/dehydration/hypotension/lactic acidosis: -s/p LR due to lactic acidosis. Continue D5 0.45 NS   -Discontinue metoprolol. -cortisol level 16.8 WNL and echocardiogram -Repeat lactic acid. -May add midodrine if blood pressure drops again   AKI: Likely prerenal from dehydration and poor p.o. intake.  Also Lasix and losartan at home.  CT renal stone study without acute finding.  CK within normal. Continue IV fluid for hydration Creatinine 1.05, improving -Closely monitor renal functions -Dose medications renally -Avoid nephrotoxic meds -Strict intake and output.   Normocytic anemia/thrombocytopenia: Notable drop in Hgb.  No  obvious bleeding.  Hemodilution?  Folic acid low. -Hb [id number] 8.5 s/p  transfusing 1 unit.   Hb 9.1--9.6 -Iron  profile and B12 within normal range Folic acid deficiency, folate level 3.0, started oral folate supplement. 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  Less likely heparin-induced.   -Check FOBT,    Chronic HFrEF/DCM/severe PAH s/p AICD: Stable.  Dehydrated on presentation.  TTE 10/2021 with LVEF of 40 to 45%, GH, indeterminate DD and severe PASP -Hold diuretics and losartan -Hold metoprolol given hypotension 04/15/2022 TTE shows LVEF 50 to 55%, no any other significant findings -Closely monitor fluid and respiratory status while on IV fluid 3/11 Lopressor 5 mg IV one-time dose given due to persistent tachycardia  Hyperkalemia/hyponatremia: Resolved.   Hypophosphatemia, Phos repleted. Hypokalemia/hypomagnesemia/hypocalcemia -Monitor and replenish as appropriate. -IV fluid as above.   Vitamin D deficiency -Vitamin D 50,000 units weekly    Elevated digoxin levels -Continue holding digoxin Digoxin level 3.2->1.9, trended down     Chronic respiratory failure on 4 L oxygen at baseline  Continue supplemental O2 admission Continue home bronchodilators prn   Dementia with behavioral disturbance:  -Continue donepezil and memantine -See AMS above   Depression: Stable. -Continue hydroxyzine and mirtazapine.  No longer on Effexor.   Hypothyroidism: TSH low but free T4 normal. -Continue home Synthroid   Physical deconditioning -PT/OT      Moderate malnutrition Body mass index is 23.03 kg/m. Nutrition Problem: Moderate Malnutrition Etiology: chronic illness (dementia) Signs/Symptoms: mild fat depletion, moderate fat depletion, mild muscle depletion, moderate muscle depletion,  percent weight loss Interventions: Magic cup, MVI, Prostat   Diet: Dysphagia 2 diet DVT Prophylaxis: SCD, pharmacological prophylaxis contraindicated due to thrombocytopenia    Advance goals of care discussion: Full code  Family Communication: family was present at bedside, at the time of interview.  The pt provided permission to discuss medical plan with the family. Opportunity was given to ask question and all questions were answered satisfactorily.  3/12 Discussed with patient's spouse at bedside.  Disposition:  Pt is from Home, admitted with AMS, still has AMS, which precludes a safe discharge. Discharge to Home, when stable.  Subjective: Overnight RN reported that is not capturing Beats intermittently but patient remained asymptomatic and hemodynamically stable. In the morning patient was more awake as compared to yesterday, unable to offer any complaints.  She is AO x 1 at baseline.  Patient is still very disoriented. I visited again and discussed with patient's husband at bedside, as per him patient is having tremors/shaking more and was requesting to discuss again with a neurologist for changing medications.   Physical Exam: General: NAD, lying comfortably Appear in no distress, affect appropriate Eyes: PERRLA ENT: Oral Mucosa Clear, moist  Neck: no JVD,  Cardiovascular: S1 and S2 Present, no Murmur,  Respiratory: good respiratory effort, Bilateral Air entry equal and Decreased, no Crackles, no wheezes Abdomen: Bowel Sound present, Soft and no tenderness,  Skin: no rashes Extremities: no Pedal edema, no calf tenderness Neurologic: without any new focal findings Gait not checked due to patient safety concerns  Vitals:   04/16/22 0445 04/16/22 0450 04/16/22 0830 04/16/22 1100  BP:  137/70 (!) 137/96 133/77  Pulse: (!) 110 (!) 104  (!) 103  Resp:  $'18 20  15  'D$ Temp:      TempSrc:      SpO2: 93% 100%    Weight:      Height:        Intake/Output Summary (Last 24  hours) at 04/16/2022 1534 Last data filed at 04/16/2022 1012 Gross per 24 hour  Intake --  Output 900 ml  Net -900 ml   Filed Weights   04/11/22 1237 04/11/22 1832  Weight: 62 kg 59 kg    Data Reviewed: I have personally reviewed and interpreted daily labs, tele strips, imagings as discussed above. I reviewed all nursing notes, pharmacy notes, vitals, pertinent old records I have discussed plan of care as described above with RN and patient/family.  CBC: Recent Labs  Lab 04/11/22 1252 04/12/22 0444 04/13/22 0059 04/13/22 0334 04/13/22 1350 04/14/22 0446 04/15/22 0443 04/16/22 0848  WBC 9.3   < > 8.3 8.3  --  6.1 6.6 7.2  NEUTROABS 6.5  --  4.5  --   --   --   --  5.0  HGB 10.1*   < > 6.8* 6.7* 8.7* 8.5* 9.1* 9.6*  HCT 34.5*   < > 22.6* 22.5* 28.0* 26.8* 30.0* 31.4*  MCV 82.3   < > 80.7 80.4  --  80.2 82.4 83.5  PLT 141*   < > 65* 64*  --  53* 51* 77*   < > = values in this interval not displayed.   Basic Metabolic Panel: Recent Labs  Lab 04/13/22 0059 04/13/22 0334 04/14/22 0446 04/14/22 1037 04/15/22 0443 04/16/22 0848  NA 140 140 143 144 141 142  K 3.7 3.4* 5.9* 4.3 4.2 3.4*  CL 108 109 117* 114* 113* 111  CO2 '23 22 22 25 '$ 21* 23  GLUCOSE 74 124* 299* 97 83 77  BUN 44* 44* 30* 28* 24* 18  CREATININE 2.40* 2.28* 1.50* 1.44* 1.17* 1.05*  CALCIUM 8.2* 8.2* 7.5* 8.0* 7.7* 8.1*  MG 1.4* 1.4* 2.2  --  2.1 1.7  PHOS 2.5  --  1.8*  --  4.6 2.2*    Studies: No results found.  Scheduled Meds:  (feeding supplement) PROSource Plus  30 mL Oral TID BM   allopurinol  50 mg Oral Daily   atorvastatin  80 mg Oral QHS   donepezil  10 mg Oral QHS   famotidine  20 mg Oral Daily   folic acid  1 mg Oral Daily   gabapentin  100 mg Oral BID   lacosamide  100 mg Oral BID   levothyroxine  100 mcg Oral QAC breakfast   memantine  10 mg Oral BID   midodrine  5 mg Oral TID WC   mirtazapine  7.5 mg Oral QHS   mometasone-formoterol  2 puff Inhalation BID   multivitamin with  minerals  1 tablet Oral Daily   Vitamin D (Ergocalciferol)  50,000 Units Oral Q7 days   Continuous Infusions:  dextrose 5 % and 0.45% NaCl 75 mL/hr at 04/16/22 1507   PRN Meds: acetaminophen **OR** acetaminophen, hydrOXYzine, ipratropium-albuterol, ondansetron (ZOFRAN) IV, polyvinyl alcohol, senna-docusate, traZODone  Time spent: 35 minutes  Author: Val Riles. MD [facility] 04/16/2022 3:34 PM  To reach On-call, see care teams to locate the attending and reach out to them via [email or web address]. If 7PM-7AM, please contact night-coverage If you still have difficulty reaching the attending provider, please page the [facility] (Director on Call) for [facility] on amion for assistance.

## 2022-04-16 NOTE — TOC Progression Note (Signed)
Transition of Care [facility]) - Progression Note    Patient Details  Name: Stacy Grimes MRN: [id number] Date of Birth: March 16, 1950  [facility]) CM/SW Contact  Laurena Slimmer, RN Phone Number: 04/16/2022, 4:33 PM  Clinical Narrative:    Spoke with patient's husband at the bedside. He is agreeable to patient receiving a bed however does not wish for the reuqest to be sent to [facility] or [facility]    Expected Discharge Plan: [place] Barriers to Discharge: Continued Medical Work up  Expected Discharge Plan and Services                                   HH Arranged: PT, OT, RN [facility] Agency: [facility] Date [place]: 04/12/22 Time [place]: [id number] Representative spoke with at [place]: [place] ([place]) Interventions SDOH Screenings   Food Insecurity: No Food Insecurity (03/07/2022)  Housing: Low Risk  (03/07/2022)  Transportation Needs: No Transportation Needs (03/07/2022)  Utilities: Not At Risk (03/07/2022)  Depression (PHQ2-9): Low Risk  (09/01/2019)  Tobacco Use: Medium Risk (04/11/2022)    Readmission Risk Interventions    04/12/2022    1:27 PM 03/08/2022    1:45 PM  Readmission Risk Prevention Plan  Transportation Screening Complete Complete  Medication Review Press photographer) Complete Complete  PCP or Specialist appointment within 3-5 days of discharge Complete Complete  HRI or Home Care Consult Complete Complete  SW Recovery Care/Counseling Consult Complete Complete  Palliative Care Screening Not Complete   [place] Not Complete Complete

## 2022-04-16 NOTE — Progress Notes (Signed)
Physical Therapy Treatment Patient Details Name: Stacy Grimes MRN: [id number] DOB: Oct 16, 1950 Today's Date: 04/16/2022   History of Present Illness Pt is a 72 year old female with history of stroke, dementia, COPD on chronic 4 L nasal cannula,  cardiomyopathy, seizure disorder, PE, and severe PAH s/p AICD who presents to the emergency department for chief concerns of altered mental status. MD assessment includes: altered mental status, UTI, mild hematuria,  hypoglycemia, dehydration, hypotension, lactic acidosis, AKI, normocytic anemia, thrombocytopenia, hyperkalemia, hyponatremia, hypomagnesemia, hypocalcemia, focal seizures with secondary generalization, and physical deconditioning.    PT Comments    Pt received in bed, husband at bedside feeding pt small bites of sandwich. Pt assisted to EOB with MaxA for ~10 minutes requiring ModA to maintain. Pt with poor sitting balance shaky and tremorous at times, remained alert and able to answer simple questions with nodding/muffled speech. Discussed POC at length with pt's spouse who is insisting on taking pt home. Education provided on safe care and management for pt if she returns home. Pt will benefit from a hospital bed and home health services if this is the decision. There is a ramp at home with w/c - but no pressure relief seat cushion. Will continue to follow acutely and assist with pt needs.    Recommendations for follow up therapy are one component of a multi-disciplinary discharge planning process, led by the attending physician.  Recommendations may be updated based on patient status, additional functional criteria and insurance authorization.  Follow Up Recommendations  Skilled nursing-short term rehab (<3 hours/day) Can patient physically be transported by private vehicle: No   Assistance Recommended at Discharge Frequent or constant Supervision/Assistance  Patient can return home with the following Two people to help with walking  and/or transfers;Two people to help with bathing/dressing/bathroom;Assistance with cooking/housework;Assistance with feeding;Direct supervision/assist for medications management;Direct supervision/assist for financial management;Assist for transportation;Help with stairs or ramp for entrance   Equipment Recommendations  Other (comment) (If pt's husband declines SNF, pt will benefit from hospital bed for home and home health services)    Recommendations for Other Services       Precautions / Restrictions Precautions Precautions: Fall Precaution Comments:  (Open areas on buttocks) Restrictions Weight Bearing Restrictions: No     Mobility  Bed Mobility Overal bed mobility: Needs Assistance Bed Mobility: Rolling Rolling: Max assist   Supine to sit: Max assist Sit to supine: Max assist   General bed mobility comments: Pt attempting to assist with UE's for bed mobility.    Transfers Overall transfer level:  (Unable to attempt due to safety concerns)                      Ambulation/Gait               General Gait Details:  (non-ambulatory)   Stairs             Wheelchair Mobility    Modified Rankin (Stroke Patients Only)       Balance Overall balance assessment: Needs assistance Sitting-balance support: Bilateral upper extremity supported Sitting balance-Leahy Scale: Poor Sitting balance - Comments:  (Pt sat EOB ~10 minutes with ModA to maintain at times) Postural control: Right lateral lean     Standing balance comment: Unable to stand                            Cognition Arousal/Alertness: Awake/alert Behavior During Therapy: WFL for tasks assessed/performed Overall Cognitive Status:  Impaired/Different from baseline Area of Impairment: Orientation, Safety/judgement, Problem solving, Following commands                 Orientation Level: Disoriented to, Place, Time, Situation Current Attention Level: Focused   Following  Commands: Follows one step commands with increased time     Problem Solving: Slow processing, Decreased initiation, Difficulty sequencing, Requires verbal cues, Requires tactile cues General Comments:  (Pt alert to self and husband.)        Exercises General Exercises - Lower Extremity Ankle Circles/Pumps: AAROM, Both, 5 reps Heel Slides: AAROM, Both, 5 reps Hip ABduction/ADduction: AAROM, Both, 5 reps    General Comments General comments (skin integrity, edema, etc.):  (L UE edematous, buttocks red with open area at R ischial tuberositly, nursing notified. Education provided to pt's husband regarding role of PT, current recommendations, and safe d/c plans)      Pertinent Vitals/Pain Pain Assessment Pain Assessment: Faces Faces Pain Scale: Hurts little more Pain Location:  (Buttocks) Pain Descriptors / Indicators: Aching, Grimacing Pain Intervention(s): Limited activity within patient's tolerance (RN notified of open areas on buttocks)    Home Living                          Prior Function            PT Goals (current goals can now be found in the care plan section)      Frequency    Min 2X/week      PT Plan      Co-evaluation              AM-PAC PT "6 Clicks" Mobility   Outcome Measure  Help needed turning from your back to your side while in a flat bed without using bedrails?: A Lot Help needed moving from lying on your back to sitting on the side of a flat bed without using bedrails?: A Lot Help needed moving to and from a bed to a chair (including a wheelchair)?: Total Help needed standing up from a chair using your arms (e.g., wheelchair or bedside chair)?: Total Help needed to walk in hospital room?: Total Help needed climbing 3-5 steps with a railing? : Total 6 Click Score: 8    End of Session Equipment Utilized During Treatment: Oxygen Activity Tolerance: Patient tolerated treatment well Patient left: in bed;with call bell/phone  within reach;with bed alarm set;with family/visitor present Nurse Communication: Mobility status (Skin breakdown at buttocks) PT Visit Diagnosis: Difficulty in walking, not elsewhere classified (R26.2);Muscle weakness (generalized) (M62.81)     Time: [id number] PT Time Calculation (min) (ACUTE ONLY): 42 min  Charges:  $Therapeutic Exercise: 8-22 mins $Therapeutic Activity: 23-37 mins                    Mikel Cella, PTA  Josie Dixon 04/16/2022, 1:30 PM

## 2022-04-16 NOTE — Care Management Important Message (Signed)
Important Message  Patient Details  Name: Stacy Grimes MRN: [id number] Date of Birth: 1950/08/25   Medicare Important Message Given:  Yes  Reviewed Medicare IM with [name], spouse, at [phone].  Copy of Medicare IM left in room for reference.   [name] 04/16/2022, 11:53 AM

## 2022-04-16 NOTE — Consult Note (Signed)
                                                                                   Consultation Note Date: 04/16/2022 at 1400  Patient Name: Stacy Grimes  DOB: 06-09-1950  MRN: [id number]  Age / Sex: 72 y.o., female  PCP: Juluis Pitch, MD Referring Physician: Val Riles, MD  Reason for Consultation: {Reason for Consult:23484}  HPI/Patient Profile: 72 y.o. female  with past medical history of *** admitted on 04/11/2022 with ***.   Clinical Assessment and Goals of Care: I have reviewed medical records including EPIC notes, labs and imaging, assessed the patient and then met with ***  to discuss diagnosis prognosis, GOC, EOL wishes, disposition and options.  I introduced Palliative Medicine as specialized medical care for people living with serious illness. It focuses on providing relief from the symptoms and stress of a serious illness. The goal is to improve quality of life for both the patient and the family.  We discussed a brief life review of the patient.   As far as functional and nutritional status   We discussed patient's current illness and what it means in the larger context of patient's on-going co-morbidities.  Natural disease trajectory and expectations at EOL were discussed.  I attempted to elicit values and goals of care important to the patient.    The difference between aggressive medical intervention and comfort care was considered in light of the patient's goals of care.   Advance directives, concepts specific to code status, artificial feeding and hydration, and rehospitalization were considered and discussed.  Education offered regarding concept specific to human mortality and the limitations of medical interventions to prolong life when the body begins to fail to thrive.  Family is facing treatment option decisions, advanced directive, and anticipatory care needs.    Discussed with patient/family the importance of continued conversation with family and the  medical providers regarding overall plan of care and treatment options, ensuring decisions are within the context of the patient's values and GOCs.    Hospice and Palliative Care services outpatient were explained and offered.  Questions and concerns were addressed. The family was encouraged to call with questions or concerns.   Primary Decision Maker {Primary Decision [id number]}  Physical Exam  Palliative Assessment/Data:     Thank you for this consult. Palliative medicine will continue to follow and assist holistically.   Time Total: *** minutes Greater than 50%  of this time was spent counseling and coordinating care related to the above assessment and plan.  Signed by: Jordan Hawks, DNP, FNP-BC Palliative Medicine    Please contact Palliative Medicine Team phone at [phone] for questions and concerns.  For individual provider: See Shea Evans

## 2022-04-16 NOTE — Progress Notes (Signed)
Attempted to administer scheduled medications, patient refused. Medications offered in multiple foods on dinner tray, patient continued to resist staff and turn her head away. Liquids offered to patient and she refused those as well. Patient left in bed, bed alarm on and call bell within reach.

## 2022-04-16 NOTE — Progress Notes (Signed)
       CROSS COVER NOTE  NAME: Stacy Grimes MRN: [id number] DOB : 11-Sep-1950 ATTENDING PHYSICIAN: Val Riles, MD    Date of Service   04/16/2022   HPI/Events of Note   Message received from RN reporting Stacy Grimes's pacer is intermittently not capturing. Patient is asymptomatic and hemodynamically stable.  Interventions   Assessment/Plan: EKG Requested RN call lab to Draw AM labs now for electrolyte check Consider Cardiology consult for interrogation      To reach the provider On-Call:   7AM- 7PM see care teams to locate the attending and reach out to them via [email or web address] Password: [email or web address] 7PM-7AM contact night-coverage If you still have difficulty reaching the appropriate provider, please page the [facility] (Director on Call) for [facility] on amion for assistance  This document was prepared using Systems analyst and may include unintentional dictation errors.  Neomia Glass DNP, MBA, FNP-BC, PMHNP-BC Nurse Practitioner [facility] Pager [phone]

## 2022-04-16 NOTE — Consult Note (Signed)
[place] NOTE  Patient Care Team: Juluis Pitch, MD as PCP - General (Family Medicine) Deboraha Sprang, MD as PCP - Electrophysiology (Cardiology) Wellington Hampshire, MD as PCP - Cardiology (Cardiology)  CHIEF COMPLAINTS/PURPOSE OF CONSULTATION: Thrombocytopenia.   HISTORY OF PRESENTING ILLNESS:  Stacy Grimes 72 y.o.  female multiple medical issues including dementia, chronic respiratory failure history of CVA heart failure status post AICD hypertension anxiety seizure disorder is currently admitted to hospital for altered mental status changes.  Patient also noted to be in lactic acidosis with electrolyte abnormalities and AKI.   Mental status changes thought to be from polypharmacy vs UTI.  Patient currently on ceftriaxone.  Patient noted to have platelets of 141 on admission to slowly gone down to 42s yesterday.  Of note patient had severe anemia hematology with a hemoglobin of 6.8 on admission slowly improved to around 8 to 9 days of course of the hospitalization.  Has been consulted for further evaluation recommendations.   As per the nursing staff no evidence of any bleeding.   Review of Systems  Unable to perform ROS: Mental status change    MEDICAL HISTORY:  Past Medical History:  Diagnosis Date   AAA (abdominal aortic aneurysm) ([place])    Anginal pain ([place])    Anxiety    Arthritis    Automatic implantable cardioverter-defibrillator in situ    Biventricular ICD  [place]    a.  2011 s/p SJM CRT-D; b. 09/2016 gen change: SJM BiV AICD (ser# [id number]).   COPD (chronic obstructive pulmonary disease) (HCC)    Deafness    left ear   Dementia ([place])    Depression    Dizziness    Fracture    history of spinal fracture   GERD (gastroesophageal reflux disease)    HFrEF (heart failure with reduced ejection fraction) ([place])    a. 2010 Cath: nonobs dzs, EF 15-20%; b. 09/2017 Echo: EF suboptimal. EF low nl; c. 04/2018 Echo: EF 40-45%; d. 10/2021 Echo: EF  40-45%, glob HK, mildly red RV fxn, sev elev PASP, triv MR, mild-mod TR.   Hypertension    Hypothyroidism    Non-ischemic cardiomyopathy ([place])    a. 2010 Cath: nonobs dzs, EF 15-20%; b. 2011 s/p SJM CRT-D; c. 09/2016 s/p Gen change; d. 09/2017 Echo: EF suboptimal. EF low nl; e. 04/2018 Echo: EF 40-45%; f. 10/2021 Echo: EF 40-45%, glob HK, mildly red RV fxn, sev elev PASP, triv MR, mild-mod TR.   Oxygen dependent    Palpitations    Sleep apnea     SURGICAL HISTORY: Past Surgical History:  Procedure Laterality Date   ABDOMINAL HYSTERECTOMY     BIV ICD GENERATOR CHANGEOUT N/A 09/17/2016   Procedure: BiV ICD Generator Changeout;  Surgeon: Deboraha Sprang, MD;  Location: [facility];  Service: Cardiovascular;  Laterality: N/A;   BLADDER SURGERY     CARDIAC CATHETERIZATION     CARDIAC DEFIBRILLATOR PLACEMENT  4 yrs ago   pacemaker   CATARACT EXTRACTION W/PHACO Left 09/01/2014   Procedure: CATARACT EXTRACTION PHACO AND INTRAOCULAR LENS PLACEMENT ([place]);  Surgeon: Lyla Glassing, MD;  Location: [facility];  Service: Ophthalmology;  Laterality: Left;  [place]: 1:12.1    CATARACT EXTRACTION W/PHACO Right 09/29/2014   Procedure: CATARACT EXTRACTION PHACO AND INTRAOCULAR LENS PLACEMENT (IOC);  Surgeon: Lyla Glassing, MD;  Location: [facility];  Service: Ophthalmology;  Laterality: Right;  [place]: 00:52.7    CHOLECYSTECTOMY     COLON SURGERY  COLONOSCOPY     COLONOSCOPY N/A 08/31/2012   Procedure: COLONOSCOPY;  Surgeon: Inda Castle, MD;  [facility];  Service: Endoscopy;  Laterality: N/A;   ESOPHAGOGASTRODUODENOSCOPY N/A 07/06/2012   Procedure: ESOPHAGOGASTRODUODENOSCOPY (EGD);  Surgeon: Lafayette Dragon, MD;  Location: [facility];  Service: Endoscopy;  Laterality: N/A;   INTRAMEDULLARY (IM) NAIL INTERTROCHANTERIC Right 10/23/2021   Procedure: INTRAMEDULLARY (IM) NAIL INTERTROCHANTERIC;  Surgeon: Corky Mull, MD;  Location: [facility];  Service: Orthopedics;  Laterality: Right;   KNEE SURGERY  Right    MIDDLE EAR SURGERY     SAVORY DILATION N/A 07/06/2012   Procedure: SAVORY DILATION;  Surgeon: Lafayette Dragon, MD;  [facility];  Service: Endoscopy;  Laterality: N/A;   TEE WITHOUT CARDIOVERSION N/A 06/23/2020   Procedure: TRANSESOPHAGEAL ECHOCARDIOGRAM (TEE);  Surgeon: Minna Merritts, MD;  Location: [facility];  Service: Cardiovascular;  Laterality: N/A;   WRIST SURGERY Right     SOCIAL HISTORY: Social History   Socioeconomic History   Marital status: Married    Spouse name: Not on file   Number of children: 3   Years of education: Not on file   Highest education level: Not on file  Occupational History   Occupation: Retired    Fish farm manager: DISABLED  Tobacco Use   Smoking status: Former    Packs/day: 3.00    Years: 20.00    Total pack years: 60.00    Types: Cigarettes    Quit date: 02/04/2002    Years since quitting: 20.2   Smokeless tobacco: Never  Vaping Use   Vaping Use: Never used  Substance and Sexual Activity   Alcohol use: No   Drug use: No   Sexual activity: Not Currently  Other Topics Concern   Not on file  Social History Narrative   Not on file   Social Determinants of Health   Financial Resource Strain: Not on file  Food Insecurity: No Food Insecurity (03/07/2022)   Hunger Vital Sign    Worried About Running Out of Food in the Last Year: Never true    Ran Out of Food in the Last Year: Never true  Transportation Needs: No Transportation Needs (03/07/2022)   PRAPARE - Hydrologist (Medical): No    Lack of Transportation (Non-Medical): No  Physical Activity: Not on file  Stress: Not on file  Social Connections: Not on file  Intimate Partner Violence: Not At Risk (03/07/2022)   Humiliation, Afraid, Rape, and Kick questionnaire    Fear of Current or Ex-Partner: No    Emotionally Abused: No    Physically Abused: No    Sexually Abused: No    FAMILY HISTORY: Family History  Problem Relation Age of Onset    Hypertension Mother    Breast cancer Mother    Stroke Mother    Heart disease Father    Heart attack Sister    Hypertension Brother    Heart attack Sister    Colon cancer Brother 6    ALLERGIES:  is allergic to codeine and contrast media [iodinated contrast media].  MEDICATIONS:  Current Facility-Administered Medications  Medication Dose Route Frequency Provider Last Rate Last Admin   (feeding supplement) PROSource Plus liquid 30 mL  30 mL Oral TID BM Amin, Ankit Chirag, MD   30 mL at 04/16/22 1318   acetaminophen (TYLENOL) tablet 650 mg  650 mg Oral Q6H PRN Cox, Amy N, DO   650 mg at 04/16/22 0404   Or  acetaminophen (TYLENOL) suppository 650 mg  650 mg Rectal Q6H PRN Cox, Amy N, DO       allopurinol (ZYLOPRIM) tablet 50 mg  50 mg Oral Daily Cox, Amy N, DO   50 mg at 04/16/22 0834   atorvastatin (LIPITOR) tablet 80 mg  80 mg Oral QHS Cox, Amy N, DO   80 mg at 04/15/22 2114   cefTRIAXone (ROCEPHIN) 2 g in sodium chloride 0.9 % 100 mL IVPB  2 g Intravenous Q24H Cox, Amy N, DO 200 mL/hr at 04/15/22 1755 2 g at 04/15/22 1755   dextrose 5 %-0.45 % sodium chloride infusion   Intravenous Continuous Sharion Settler, NP 75 mL/hr at 04/14/22 1930 Restarted at 04/14/22 1930   donepezil (ARICEPT) tablet 10 mg  10 mg Oral QHS Cox, Amy N, DO   10 mg at 04/15/22 2114   famotidine (PEPCID) tablet 20 mg  20 mg Oral Daily Cox, Amy N, DO   20 mg at [id number]   folic acid (FOLVITE) tablet 1 mg  1 mg Oral Daily Wendee Beavers T, MD   1 mg at 04/16/22 0834   gabapentin (NEURONTIN) capsule 100 mg  100 mg Oral BID Wendee Beavers T, MD   100 mg at 04/16/22 [id number]   hydrOXYzine (ATARAX) tablet 25 mg  25 mg Oral QHS PRN Cox, Amy N, DO       ipratropium-albuterol (DUONEB) 0.5-2.5 (3) MG/3ML nebulizer solution 3 mL  3 mL Nebulization Q4H PRN Amin, Ankit Chirag, MD       lacosamide (VIMPAT) oral solution 100 mg  100 mg Oral BID Bhagat, Srishti L, MD   100 mg at 04/16/22 [id number]   levothyroxine (SYNTHROID) tablet 100  mcg  100 mcg Oral QAC breakfast Cox, Amy N, DO   100 mcg at 04/16/22 0501   memantine (NAMENDA) tablet 10 mg  10 mg Oral BID Cox, Amy N, DO   10 mg at 04/16/22 0834   midodrine (PROAMATINE) tablet 5 mg  5 mg Oral TID WC Val Riles, MD   5 mg at 04/14/22 1743   mirtazapine (REMERON) tablet 7.5 mg  7.5 mg Oral QHS Amin, Ankit Chirag, MD   7.5 mg at 04/15/22 2114   mometasone-formoterol (DULERA) 200-5 MCG/ACT inhaler 2 puff  2 puff Inhalation BID Cox, Amy N, DO   2 puff at 04/16/22 [id number]   multivitamin with minerals tablet 1 tablet  1 tablet Oral Daily Amin, Ankit Chirag, MD   1 tablet at 04/16/22 0834   ondansetron (ZOFRAN) injection 4 mg  4 mg Intravenous Q6H PRN Amin, Ankit Chirag, MD       polyvinyl alcohol (LIQUIFILM TEARS) 1.4 % ophthalmic solution 1 drop  1 drop Both Eyes PRN Cox, Amy N, DO       senna-docusate (Senokot-S) tablet 1 tablet  1 tablet Oral QHS PRN Amin, Ankit Chirag, MD       traZODone (DESYREL) tablet 50 mg  50 mg Oral QHS PRN Amin, Ankit Chirag, MD   50 mg at 04/15/22 2219   Vitamin D (Ergocalciferol) (DRISDOL) 1.25 MG (50000 UNIT) capsule 50,000 Units  50,000 Units Oral Q7 days Mercy Riding, MD   50,000 Units at 04/13/22 1204    PHYSICAL EXAMINATION: Patient alert oriented x 0.  Vitals:   04/16/22 0830 04/16/22 1100  BP: (!) 137/96 133/77  Pulse:  (!) 103  Resp:  15  Temp:    SpO2:     Filed Weights   04/11/22 1237 04/11/22  1832  Weight: 136 lb 11 oz (62 kg) 130 lb (59 kg)    Physical Exam Vitals and nursing note reviewed.  HENT:     Head: Normocephalic and atraumatic.     Mouth/Throat:     Pharynx: Oropharynx is clear.  Eyes:     Extraocular Movements: Extraocular movements intact.     Pupils: Pupils are equal, round, and reactive to light.  Cardiovascular:     Rate and Rhythm: Normal rate and regular rhythm.  Pulmonary:     Comments: Decreased breath sounds bilaterally.  Abdominal:     Palpations: Abdomen is soft.  Musculoskeletal:         General: Normal range of motion.     Cervical back: Normal range of motion.  Skin:    General: Skin is warm.  Neurological:     Mental Status: She is alert.     LABORATORY DATA:  I have reviewed the data as listed Lab Results  Component Value Date   WBC 7.2 04/16/2022   HGB 9.6 (L) 04/16/2022   HCT 31.4 (L) 04/16/2022   MCV 83.5 04/16/2022   PLT 77 (L) 04/16/2022   Recent Labs    04/11/22 1252 04/11/22 1905 04/13/22 1128 04/14/22 0446 04/14/22 1037 04/15/22 0443 04/16/22 0848  NA 148*   < >  --  143 144 141 142  K 6.3*   < >  --  5.9* 4.3 4.2 3.4*  CL 116*   < >  --  117* 114* 113* 111  CO2 21*   < >  --  22 25 21* 23  GLUCOSE 114*   < >  --  299* 97 83 77  BUN 62*   < >  --  30* 28* 24* 18  CREATININE 3.72*   < >  --  1.50* 1.44* 1.17* 1.05*  CALCIUM 9.3   < >  --  7.5* 8.0* 7.7* 8.1*  GFRNONAA 12*   < >  --  37* 39* 50* 56*  PROT 7.7  --  5.7* 5.4*  --   --   --   ALBUMIN 2.7*  --  2.0* 1.7*  --   --   --   AST 45*  --  32 30  --   --   --   ALT 19  --  14 12  --   --   --   ALKPHOS 151*  --  102 107  --   --   --   BILITOT 0.3  --  0.3 0.4  --   --   --   BILIDIR  --   --  <0.1  --   --   --   --   IBILI  --   --  NOT CALCULATED  --   --   --   --    < > = values in this interval not displayed.    RADIOGRAPHIC STUDIES: I have personally reviewed the radiological images as listed and agreed with the findings in the report. ECHOCARDIOGRAM COMPLETE  Result Date: 04/15/2022    ECHOCARDIOGRAM REPORT   Patient Name:   NANIE HUMFLEET [facility] Date of Exam: 04/15/2022 Medical Rec #:  [id number]        Height:       63.0 in Accession #:    [id number]       Weight:       130.0 lb Date of Birth:  06/03/1950  BSA:          1.610 m Patient Age:    72 years         BP:           120/47 mmHg Patient Gender: F                HR:           110 bpm. Exam Location:  [facility] Procedure: 2D Echo, Cardiac Doppler and Color Doppler Indications:     Hypotension  History:         Patient has  prior history of Echocardiogram examinations, most                  recent 10/23/2021. CHF and Cardiomyopathy, Defibrillator,                  Stroke, Arrythmias:Tachycardia, Signs/Symptoms:Altered Mental                  Status; Risk Factors:Hypertension and Diabetes. Patient with                  Dementia is unable to follow instructions.  Sonographer:     Wenda Low Referring Phys:  [id number] Charlesetta Ivory GONFA Diagnosing Phys: Kathlyn Sacramento MD  Sonographer Comments: Technically difficult study due to poor echo windows and suboptimal apical window. Image acquisition challenging due to uncooperative patient. IMPRESSIONS  1. Left ventricular ejection fraction, by estimation, is 50 to 55%. The left ventricle has low normal function. Left ventricular endocardial border not optimally defined to evaluate regional wall motion. Left ventricular diastolic parameters are indeterminate.  2. Right ventricular systolic function is normal. The right ventricular size is normal. Tricuspid regurgitation signal is inadequate for assessing PA pressure.  3. The mitral valve is normal in structure. No evidence of mitral valve regurgitation. No evidence of mitral stenosis.  4. The aortic valve is normal in structure. Aortic valve regurgitation is not visualized. Aortic valve sclerosis/calcification is present, without any evidence of aortic stenosis.  5. The inferior vena cava is normal in size with <50% respiratory variability, suggesting right atrial pressure of 8 mmHg. FINDINGS  Left Ventricle: Left ventricular ejection fraction, by estimation, is 50 to 55%. The left ventricle has low normal function. Left ventricular endocardial border not optimally defined to evaluate regional wall motion. The left ventricular internal cavity  size was normal in size. There is no left ventricular hypertrophy. Left ventricular diastolic parameters are indeterminate. Right Ventricle: The right ventricular size is normal. No increase in right  ventricular wall thickness. Right ventricular systolic function is normal. Tricuspid regurgitation signal is inadequate for assessing PA pressure. The tricuspid regurgitant velocity is 2.78 m/s, and with an assumed right atrial pressure of 8 mmHg, the estimated right ventricular systolic pressure is [id number] mmHg. Left Atrium: Left atrial size was normal in size. Right Atrium: Right atrial size was normal in size. Pericardium: There is no evidence of pericardial effusion. Mitral Valve: The mitral valve is normal in structure. No evidence of mitral valve regurgitation. No evidence of mitral valve stenosis. MV peak gradient, 7.4 mmHg. The mean mitral valve gradient is 2.0 mmHg. Tricuspid Valve: The tricuspid valve is normal in structure. Tricuspid valve regurgitation is trivial. No evidence of tricuspid stenosis. Aortic Valve: The aortic valve is normal in structure. Aortic valve regurgitation is not visualized. Aortic valve sclerosis/calcification is present, without any evidence of aortic stenosis. Aortic valve mean gradient measures 2.0 mmHg. Aortic valve peak  gradient measures  5.1 mmHg. Aortic valve area, by VTI measures 2.12 cm. Pulmonic Valve: The pulmonic valve was normal in structure. Pulmonic valve regurgitation is not visualized. No evidence of pulmonic stenosis. Aorta: The aortic root is normal in size and structure. Venous: The inferior vena cava is normal in size with less than 50% respiratory variability, suggesting right atrial pressure of 8 mmHg. IAS/Shunts: No atrial level shunt detected by color flow Doppler. Additional Comments: A device lead is visualized.  LEFT VENTRICLE PLAX 2D LVIDd:         3.70 cm   Diastology LVIDs:         2.90 cm   LV e' medial:   12.90 cm/s LV PW:         1.00 cm   LV E/e' medial: 9.6 LV IVS:        0.90 cm LVOT diam:     1.90 cm LV SV:         29 LV SV Index:   18 LVOT Area:     2.84 cm  LEFT ATRIUM         Index LA diam:    2.30 cm 1.43 cm/m  AORTIC VALVE                     PULMONIC VALVE AV Area (Vmax):    2.76 cm     PV Vmax:       1.05 m/s AV Area (Vmean):   2.53 cm     PV Peak grad:  4.4 mmHg AV Area (VTI):     2.12 cm AV Vmax:           113.00 cm/s AV Vmean:          61.400 cm/s AV VTI:            0.135 m AV Peak Grad:      5.1 mmHg AV Mean Grad:      2.0 mmHg LVOT Vmax:         110.00 cm/s LVOT Vmean:        54.700 cm/s LVOT VTI:          0.101 m LVOT/AV VTI ratio: 0.75  AORTA Ao Root diam: 2.90 cm MITRAL VALVE                TRICUSPID VALVE MV Area (PHT): 5.27 cm     TR Peak grad:   30.9 mmHg MV Area VTI:   1.79 cm     TR Vmax:        278.00 cm/s MV Peak grad:  7.4 mmHg MV Mean grad:  2.0 mmHg     SHUNTS MV Vmax:       1.36 m/s     Systemic VTI:  0.10 m MV Vmean:      64.4 cm/s    Systemic Diam: 1.90 cm MV Decel Time: 144 msec MV E velocity: 124.00 cm/s Kathlyn Sacramento MD Electronically signed by Kathlyn Sacramento MD Signature Date/Time: 04/15/2022/2:28:38 PM    Final    CT Renal Stone Study  Result Date: 04/11/2022 CLINICAL DATA:  Flank pain. EXAM: CT ABDOMEN AND PELVIS WITHOUT CONTRAST TECHNIQUE: Multidetector CT imaging of the abdomen and pelvis was performed following the standard protocol without IV contrast. RADIATION DOSE REDUCTION: This exam was performed according to the departmental dose-optimization program which includes automated exposure control, adjustment of the mA and/or kV according to patient size and/or use of iterative reconstruction technique. COMPARISON:  December 15, 2019. FINDINGS:  Lower chest: No acute abnormality. Hepatobiliary: No focal liver abnormality is seen. Status post cholecystectomy. No biliary dilatation. Pancreas: Unremarkable. No pancreatic ductal dilatation or surrounding inflammatory changes. Spleen: Normal in size without focal abnormality. Adrenals/Urinary Tract: Adrenal glands appear normal. Small exophytic cyst is seen involving left kidney for which no further follow-up is required. No hydronephrosis or renal obstruction is  noted. No renal or ureteral calculi are noted. Urinary bladder is decompressed. Stomach/Bowel: The stomach is unremarkable. There is no evidence of bowel obstruction or inflammation. Postsurgical changes are seen involving sigmoid colon. Vascular/Lymphatic: Aortic atherosclerosis. No enlarged abdominal or pelvic lymph nodes. Reproductive: Status post hysterectomy. No adnexal masses. Other: No abdominal wall hernia or abnormality. No abdominopelvic ascites. Musculoskeletal: Old L1 compression fracture is noted. No acute osseous abnormality is noted. IMPRESSION: No acute abnormality seen in the abdomen or pelvis. Aortic Atherosclerosis (ICD10-I70.0). Electronically Signed   By: Marijo Conception M.D.   On: 04/11/2022 14:27   CT HEAD WO CONTRAST (5MM)  Result Date: 04/11/2022 CLINICAL DATA:  Altered mental status EXAM: CT HEAD WITHOUT CONTRAST TECHNIQUE: Contiguous axial images were obtained from the base of the skull through the vertex without intravenous contrast. RADIATION DOSE REDUCTION: This exam was performed according to the departmental dose-optimization program which includes automated exposure control, adjustment of the mA and/or kV according to patient size and/or use of iterative reconstruction technique. COMPARISON:  CT head 03/06/2018 FINDINGS: Brain: There is no acute intracranial hemorrhage, extra-axial fluid collection, or acute infarct Background parenchymal volume loss with prominence of the ventricular system and extra-axial CSF spaces is unchanged. The ventricles are stable in size. Multifocal cortical encephalomalacia in both cerebral hemispheres, most notably in the left parietal and occipital lobes, is unchanged. Additional patchy hypodensity in the remainder of the white matter likely reflecting sequela of underlying chronic small-vessel ischemic change is also stable. The pituitary and suprasellar region are normal. There is no mass lesion there is no mass effect or midline shift. Vascular:  There is calcification of the bilateral carotid siphons. Skull: Normal. Negative for fracture or focal lesion. Sinuses/Orbits: The imaged paranasal sinuses are clear. Bilateral lens implants are in place. The globes and orbits are otherwise unremarkable. Other: None. IMPRESSION: Stable noncontrast head CT with no acute intracranial pathology. Electronically Signed   By: Valetta Mole M.D.   On: 04/11/2022 14:04   DG Chest Port 1 View  Result Date: 04/11/2022 CLINICAL DATA:  Altered mental status. EXAM: PORTABLE CHEST 1 VIEW COMPARISON:  Chest radiograph 03/06/2022 FINDINGS: The left chest wall cardiac device and associated leads are stable. The cardiomediastinal silhouette is stable The lungs are clear, with no focal consolidation or pulmonary edema. There is no pleural effusion or pneumothorax There is no acute osseous abnormality. IMPRESSION: Stable chest with no radiographic evidence of acute cardiopulmonary process. Electronically Signed   By: Valetta Mole M.D.   On: 04/11/2022 13:55   CUP PACEART REMOTE DEVICE CHECK  Result Date: 04/04/2022 Scheduled remote reviewed. Normal device function.  Multiple AMS, 4-6sec in duraiton Next remote 91 days. LA    72 year old patient with multiple medical problems-currently admitted to the hospital for  altered mental status-unclear etiology polypharmacy/infection is noted to have worsening thrombocytopenia.  # Acute thrombocytopenia-the most likely etiology is-medication/ceftriaxone.  However the differential is broad- including sepsis, MAHA syndrome, HIT, DIC.   # Altered mental status/delirium-in the context of advanced dementia.  S/p neurology evaluation defer to primary team for management.  # Multiple other comorbidities including-core respiratory failure COPD  CHF/malnutrition/seizure disorder  Recommendations: # Check peripheral smear, LDH, haptoglobin; immature platelet fraction; HIT antibody panel. PT/ PTT- fibrinogen.  Clinical suspicion for HIT  is low- 4 T score= LOW. HOLD off any presumptive anticoagulation.  However recommend continued holding of subcu heparin; recommend SCD instead.   # Recommend discontinuation of ceftriaxone.  Consider alternative antibiotic if needed for UTI/infection.  Thank you Dr. Dwyane Dee for allowing me to participate in the care of your pleasant patient. Please do not hesitate to contact me with questions or concerns in the interim.      Cammie Sickle, MD 04/16/2022 2:33 PM

## 2022-04-17 ENCOUNTER — Other Ambulatory Visit (HOSPITAL_COMMUNITY): Payer: Self-pay

## 2022-04-17 ENCOUNTER — Telehealth: Payer: Self-pay

## 2022-04-17 DIAGNOSIS — R410 Disorientation, unspecified: Secondary | ICD-10-CM | POA: Diagnosis not present

## 2022-04-17 LAB — CBC
HCT: 28.9 % — ABNORMAL LOW (ref 36.0–46.0)
Hemoglobin: 9 g/dL — ABNORMAL LOW (ref 12.0–15.0)
MCH: 25.2 pg — ABNORMAL LOW (ref 26.0–34.0)
MCHC: 31.1 g/dL (ref 30.0–36.0)
MCV: 81 fL (ref 80.0–100.0)
Platelets: 109 10*3/uL — ABNORMAL LOW (ref 150–400)
RBC: 3.57 MIL/uL — ABNORMAL LOW (ref 3.87–5.11)
RDW: 21 % — ABNORMAL HIGH (ref 11.5–15.5)
WBC: 7.6 10*3/uL (ref 4.0–10.5)
nRBC: 0 % (ref 0.0–0.2)

## 2022-04-17 LAB — GLUCOSE, CAPILLARY
Glucose-Capillary: 71 mg/dL (ref 70–99)
Glucose-Capillary: 96 mg/dL (ref 70–99)
Glucose-Capillary: 116 mg/dL — ABNORMAL HIGH (ref 70–99)
Glucose-Capillary: 78 mg/dL (ref 70–99)

## 2022-04-17 LAB — PHOSPHORUS: Phosphorus: 2.9 mg/dL (ref 2.5–4.6)

## 2022-04-17 LAB — BASIC METABOLIC PANEL
Anion gap: 7 (ref 5–15)
BUN: 15 mg/dL (ref 8–23)
CO2: 23 mmol/L (ref 22–32)
Calcium: 7.8 mg/dL — ABNORMAL LOW (ref 8.9–10.3)
Chloride: 111 mmol/L (ref 98–111)
Creatinine, Ser: 1.06 mg/dL — ABNORMAL HIGH (ref 0.44–1.00)
GFR, Estimated: 56 mL/min — ABNORMAL LOW (ref >60)
Glucose, Bld: 91 mg/dL (ref 70–99)
Potassium: 3.3 mmol/L — ABNORMAL LOW (ref 3.5–5.1)
Sodium: 141 mmol/L (ref 135–145)

## 2022-04-17 LAB — HEPARIN INDUCED PLATELET AB (HIT ANTIBODY)
Heparin Induced Plt Ab: 0.062 OD (ref 0.000–0.400)
Heparin Induced Plt Ab: 0.052 OD (ref 0.000–0.400)

## 2022-04-17 LAB — MAGNESIUM: Magnesium: 1.6 mg/dL — ABNORMAL LOW (ref 1.7–2.4)

## 2022-04-17 MED ORDER — MAGNESIUM SULFATE 2 GM/50ML IV SOLN
2.0000 g | Freq: Once | INTRAVENOUS | Status: AC
  Administered 2022-04-17: 2 g via INTRAVENOUS
  Filled 2022-04-17: qty 50

## 2022-04-17 MED ORDER — ACETAMINOPHEN 325 MG PO TABS
650.0000 mg | ORAL_TABLET | Freq: Four times a day (QID) | ORAL | Status: DC | PRN
  Administered 2022-04-17 – 2022-04-24 (×8): 650 mg via ORAL
  Filled 2022-04-17 (×8): qty 2

## 2022-04-17 MED ORDER — ENSURE ENLIVE PO LIQD
237.0000 mL | Freq: Two times a day (BID) | ORAL | Status: DC
  Administered 2022-04-18 – 2022-04-25 (×14): 237 mL via ORAL

## 2022-04-17 MED ORDER — POTASSIUM CHLORIDE CRYS ER 20 MEQ PO TBCR
40.0000 meq | EXTENDED_RELEASE_TABLET | Freq: Once | ORAL | Status: AC
  Administered 2022-04-17: 40 meq via ORAL
  Filled 2022-04-17: qty 2

## 2022-04-17 MED ORDER — MIDODRINE HCL 5 MG PO TABS: 5.0000 mg | ORAL_TABLET | Freq: Three times a day (TID) | ORAL | Status: DC | PRN

## 2022-04-17 MED ORDER — OXYCODONE HCL 5 MG PO TABS
5.0000 mg | ORAL_TABLET | Freq: Four times a day (QID) | ORAL | Status: DC | PRN
  Administered 2022-04-18 – 2022-04-25 (×12): 5 mg via ORAL
  Filled 2022-04-17 (×13): qty 1

## 2022-04-17 NOTE — TOC Progression Note (Addendum)
Transition of Care [facility]) - Progression Note    Patient Details  Name: MALIEA HORGER MRN: [id number] Date of Birth: [birth year]  [facility]) CM/SW Contact  Laurena Slimmer, RN Phone Number: 04/17/2022, 1:05 PM  Clinical Narrative:    Request sent to MD for hospital bed.  Request sent to [facility] [place].  Per Darnelle Maffucci due to patient's payor being [facility]  DME would have to be provided by [employer].  Patient's husband notified.  He is no longer interested in the bed due to past issues with  obtaining DME  payments being requested up front.    Expected Discharge Plan: [place] Barriers to Discharge: Continued Medical Work up  Expected Discharge Plan and Services                                   HH Arranged: PT, OT, RN [facility] Agency: [facility] Date [place]: 04/12/22 Time [place]: [id number] Representative spoke with at [place]: [place] ([place]) Interventions SDOH Screenings   Food Insecurity: No Food Insecurity (03/07/2022)  Housing: Low Risk  (03/07/2022)  Transportation Needs: No Transportation Needs (03/07/2022)  Utilities: Not At Risk (03/07/2022)  Depression (PHQ2-9): Low Risk  (09/01/2019)  Tobacco Use: Medium Risk (04/11/2022)    Readmission Risk Interventions    04/12/2022    1:27 PM 03/08/2022    1:45 PM  Readmission Risk Prevention Plan  Transportation Screening Complete Complete  Medication Review Press photographer) Complete Complete  PCP or Specialist appointment within 3-5 days of discharge Complete Complete  HRI or Home Care Consult Complete Complete  SW Recovery Care/Counseling Consult Complete Complete  Palliative Care Screening Not Complete   [place] Not Complete Complete

## 2022-04-17 NOTE — Telephone Encounter (Signed)
Received call from spouse per DPR.  He left message patient is currently hospitalized.  He also has a new phone number of [phone].  Updated number sent to check in to update in Epic.

## 2022-04-17 NOTE — Plan of Care (Signed)
I spoke with husband by phone and he is amenable to switching patient over from vimpat (2/2 tremulousness) to briviact. She previously had diarrhea on keppra but briviact has a lower rate of GI side effects. Briviact requires prior auth which we will initiate. Will continue vimpat until briviact is approved. Please call neurology with any questions.  Su Monks, MD [facility] [phone]  If 7pm- 7am, please page neurology on call as listed in [place].

## 2022-04-17 NOTE — Progress Notes (Signed)
                                                     Palliative Care Progress Note, Assessment & Plan   Patient Name: Stacy Grimes       Date: 04/17/2022 DOB: September 09, 1950  Age: 72 y.o. MRN#: [id number] Attending Physician: Val Riles, MD Primary Care Physician: Juluis Pitch, MD Admit Date: 04/11/2022  Patient is sitting up in bed.  She acknowledges my presence.  She is intermittently able to make her wishes known.  When asked orientation questions, patient shares that her cell phone is scared of her and she is in [place]. She denies c/o pain or discomfort. Patient unable to participate in discussions independently. No one at bedside during my visit.   No acute palliative needs today.   PMT will continue to follow.  [place] Ilsa Iha, FNP-BC Palliative Medicine Team Team Phone # [phone]  NO CHARGE

## 2022-04-17 NOTE — Progress Notes (Signed)
Patient's husband reported to Dr. Dwyane Dee that patient was having increased tremulousness since starting vimpat and wondered if there was an alternative seizure medication. Husband was not at bedside and I was unable to reach him by phone but I will reach out to him again later today. She previously discontinued keppra 2/2 diarrhea but her seizures were well-controlled on it. My recommendation will be to switch to briviact which is similar to keppra but has a lower incidence of GI effects. I will not make the switch until I discuss with husband to make sure he is in agreement.  Su Monks, MD [facility] [phone]  If 7pm- 7am, please page neurology on call as listed in [place].

## 2022-04-17 NOTE — Progress Notes (Signed)
Physical Therapy Treatment Patient Details Name: Stacy Grimes MRN: [id number] DOB: 1950-11-12 Today's Date: 04/17/2022   History of Present Illness Pt is a 72 year old female with history of stroke, dementia, COPD on chronic 4 L nasal cannula,  cardiomyopathy, seizure disorder, PE, and severe PAH s/p AICD who presents to the emergency department for chief concerns of altered mental status. MD assessment includes: altered mental status, UTI, mild hematuria,  hypoglycemia, dehydration, hypotension, lactic acidosis, AKI, normocytic anemia, thrombocytopenia, hyperkalemia, hyponatremia, hypomagnesemia, hypocalcemia, focal seizures with secondary generalization, and physical deconditioning.    PT Comments    Pt is making limited progress towards goals. Pt alert to self and place and somewhat resistant to mobility secondary to pain. Educated pt and family member in room about benefit of mobility. Pt agreeable to attempt sitting at EOB, however poor tolerance. Noted soiled in bed with +2 for hygiene. Will continue to progress as able.   Recommendations for follow up therapy are one component of a multi-disciplinary discharge planning process, led by the attending physician.  Recommendations may be updated based on patient status, additional functional criteria and insurance authorization.  Follow Up Recommendations  Skilled nursing-short term rehab (<3 hours/day) Can patient physically be transported by private vehicle: No   Assistance Recommended at Discharge Frequent or constant Supervision/Assistance  Patient can return home with the following Two people to help with walking and/or transfers;Two people to help with bathing/dressing/bathroom;Assistance with cooking/housework;Assistance with feeding;Direct supervision/assist for medications management;Direct supervision/assist for financial management;Assist for transportation;Help with stairs or ramp for entrance   Equipment Recommendations   Hospital bed    Recommendations for Other Services       Precautions / Restrictions Precautions Precautions: Fall Precaution Comments: open area on buttocks, RN notified to address Restrictions Weight Bearing Restrictions: No Other Position/Activity Restrictions: h/o seizures     Mobility  Bed Mobility Overal bed mobility: Needs Assistance Bed Mobility: Rolling Rolling: Max assist, +2 for physical assistance   Supine to sit: Max assist, +2 for physical assistance Sit to supine: Max assist, +2 for physical assistance   General bed mobility comments: needs +2 assist for all mobility. Once seated at EOB, pt able to attempt sitting unassisted, however heavy post lean. ONly able to tolerate a few minutes prior to returning back supine.    Transfers                   General transfer comment: unsafe due to sitting balance    Ambulation/Gait                   Stairs             Wheelchair Mobility    Modified Rankin (Stroke Patients Only)       Balance Overall balance assessment: Needs assistance Sitting-balance support: Bilateral upper extremity supported Sitting balance-Leahy Scale: Zero                                      Cognition Arousal/Alertness: Awake/alert Behavior During Therapy: WFL for tasks assessed/performed Overall Cognitive Status: Impaired/Different from baseline                                 General Comments: alert to place and self. Does recognize family in room. Has difficulty following multi step commands        Exercises  Other Exercises Other Exercises: supine AAROM on B LEs including AP, heel slides, and hip ab/add. Very resistant to mobilization and tremulous. Once seated at EOB, attempted to perform LAQ, however unable to sequencing Other Exercises: Pt soiled in bed. Needed +2 max assist for rolling and hygiene. Repositioned with pillow under R UE secondary to edema    General  Comments        Pertinent Vitals/Pain Pain Assessment Pain Assessment: Faces Faces Pain Scale: Hurts even more Pain Location: B LEs Pain Descriptors / Indicators: Aching, Grimacing Pain Intervention(s): Limited activity within patient's tolerance, Repositioned    Home Living                          Prior Function            PT Goals (current goals can now be found in the care plan section) Acute Rehab PT Goals Patient Stated Goal: unable to state PT Goal Formulation: Patient unable to participate in goal setting Time For Goal Achievement: 04/26/22 Potential to Achieve Goals: Fair Progress towards PT goals: Progressing toward goals    Frequency    Min 2X/week      PT Plan Current plan remains appropriate    Co-evaluation              AM-PAC PT "6 Clicks" Mobility   Outcome Measure  Help needed turning from your back to your side while in a flat bed without using bedrails?: Total Help needed moving from lying on your back to sitting on the side of a flat bed without using bedrails?: Total Help needed moving to and from a bed to a chair (including a wheelchair)?: Total Help needed standing up from a chair using your arms (e.g., wheelchair or bedside chair)?: Total Help needed to walk in hospital room?: Total Help needed climbing 3-5 steps with a railing? : Total 6 Click Score: 6    End of Session Equipment Utilized During Treatment: Oxygen Activity Tolerance: Patient limited by pain Patient left: in bed;with call bell/phone within reach;with bed alarm set;with family/visitor present Nurse Communication: Mobility status PT Visit Diagnosis: Difficulty in walking, not elsewhere classified (R26.2);Muscle weakness (generalized) (M62.81)     Time: [id number] PT Time Calculation (min) (ACUTE ONLY): 35 min  Charges:  $Therapeutic Exercise: 8-22 mins $Therapeutic Activity: 8-22 mins                     Greggory Stallion, PT, DPT,  GCS [phone]    Amay Mijangos 04/17/2022, 3:22 PM

## 2022-04-17 NOTE — TOC Benefit Eligibility Note (Signed)
Patient Teacher, English as a foreign language completed.    The patient is currently admitted and upon discharge could be taking Briviact 50 mg.  Prior Authorization Required  The patient is insured through [place] Patient Advocate Specialist [place] Patient Advocate Team Direct Number: [phone]  Fax: [phone]

## 2022-04-17 NOTE — Progress Notes (Signed)
[facility] Progress Note  Patient: Stacy Grimes    [id number]  DOA: 04/11/2022     Date of Service: the patient was seen and examined on 04/17/2022  Chief Complaint  Patient presents with   Altered Mental Status   Brief hospital course: 72 year old F with PMH of dementia, COPD/chronic hypoxic RF on 4 L by [place], CVA, HFrEF with AICD, HTN, anxiety and seizure disorder presenting with altered mental status for 3 days in the setting of possible polypharmacy, dehydration and possible UTI.  Also noted to have hypocalcemia with lactic acidosis, AKI hyponatremia and hyperkalemia.  Digoxin level elevated.  CT head and CXR without acute finding.  Received IV fluid, IV calcium and treatment for hyperkalemia.  Started on IV antibiotics for possible UTI.  Neurology consulted and adjusted medications to minimize polypharmacy    Assessment and Plan: Principal Problem:   Altered mental status Active Problems:   AKI (acute kidney injury) ([place])   Chronic systolic CHF (congestive heart failure) (HCC)   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   Chronic obstructive pulmonary disease, unspecified COPD type ([place])   Chronic respiratory failure with hypoxia (HCC)   Dementia ([place])   Hypertension   Benign neoplasm of colon   Seizures ([place])   Status epilepticus ([place])   Protein calorie malnutrition ([place])   Hypothyroidism   Depression   ICD (implantable cardioverter-defibrillator) in place   Hyperkalemia   Malnutrition of moderate degree  Altered mental status: Multifactorial including dehydration, AKI/azotemia, possible UTI, polypharmacy, underlying dementia.  UA with large LE but rare bacteria and no nitrite.  Unfortunately, urine culture was not sent.  Blood cultures NGTD.  CT head and CXR without acute finding.  Ammonia normal.  B12 elevated.  TSH low but free T4 normal.  Dementia seems to be severe.  She has significant electrolyte derangement likely from dehydration.   Degenerative level is elevated as well.  Seems to be on gabapentin, Remeron and Atarax at home which increases her risk of polypharmacy.  She has no focal neurologic state to suggest CVA. -Treat treatable causes-possible UTI, dehydration and hypotension -Reorientation and delirium precautions Neurology consulted, discontinued Depakote Started Vimpat, recommended to consider starting clonazepam 1 mg p.o. twice daily if needed Continue Remeron 7.5 nightly and Atarax as needed -Resume gabapentin 100 twice daily at reduced dose    History of seizure disorder:  Patient was on Depakote which has been discontinued.   Depakote level 40, below normal range Neurology consulted, started Vimpat 100 mg po bid, recommended to consider clonazepam 1 mg p.o. twice daily if needed.   Watch for any seizure activity, continue seizure precautions 3/12 neurology will evaluate patient again and make changes if needed 3/13 patient's husband agreed to discontinue Vimpat and start Briviact requires prior auth which will be initiated by neuro.  Will continue vimpat until briviact is approved.    Urinary tract infection, mild hematuria: UA with large LE but rare bacteria and no nitrites.  Unfortunately urine culture was not sent.  Blood cultures NGTD. -s/p ceftriaxone empirically given for 6 days, DC'd on 3/12 due to thrombocytopenia    Hypoglycemia/dehydration/hypotension/lactic acidosis: -s/p LR due to lactic acidosis. S/p 0.45 NS   -Discontinue metoprolol. -cortisol level 16.8 WNL and TTE LVEF 50 to 55%, no any other significant findings -Repeat lactic acid. -Use midodrine 5 mg p.o. 3 times daily as needed    AKI: Likely prerenal from dehydration and poor p.o. intake.  Also Lasix and losartan at home.  CT renal stone study  without acute finding.  CK within normal. Continue IV fluid for hydration Creatinine 1.06, improving -Closely monitor renal functions -Dose medications renally -Avoid nephrotoxic  meds -Strict intake and output.   Normocytic anemia/thrombocytopenia: Notable drop in Hgb.  No obvious bleeding.  Hemodilution?  Folic acid low. -Hb [id number] 8.5 s/p  transfusing 1 unit.   Hb 9.1--9.6--9.0 -Iron profile and B12 within normal range Folic acid deficiency, folate level 3.0, started oral folate supplement. 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  Less likely heparin-induced.   -Check FOBT,    Chronic HFrEF/DCM/severe PAH s/p AICD: Stable.  Dehydrated on presentation.  TTE 10/2021 with LVEF of 40 to 45%, GH, indeterminate DD and severe PASP -Hold diuretics and losartan -Hold metoprolol given hypotension 04/15/2022 TTE shows LVEF 50 to 55%, no any other significant findings -Closely monitor fluid and respiratory status while on IV fluid 3/11 Lopressor 5 mg IV one-time dose given due to persistent tachycardia  Hyperkalemia/hyponatremia: Resolved.   Hypophosphatemia, Phos repleted. Hypokalemia/hypomagnesemia/hypocalcemia -Monitor and replenish as appropriate. -IV fluid as above.   Vitamin D deficiency -Vitamin D 50,000 units weekly    Elevated digoxin levels -Continue holding digoxin Digoxin level 3.2->1.9, trended down     Chronic respiratory failure on 4 L oxygen at baseline  Continue supplemental O2 admission Continue home bronchodilators prn   Dementia with behavioral disturbance:  -Continue donepezil and memantine -See AMS above   Depression: Stable. -Continue hydroxyzine and mirtazapine.  No longer on Effexor.   Hypothyroidism: TSH low but free T4 normal. -Continue home Synthroid   Physical deconditioning -PT/OT      Moderate malnutrition Body  mass index is 23.03 kg/m. Nutrition Problem: Moderate Malnutrition Etiology: chronic illness (dementia) Signs/Symptoms: mild fat depletion, moderate fat depletion, mild muscle depletion, moderate muscle depletion, percent weight loss Interventions: Magic cup, MVI, Prostat   Diet: Dysphagia 2 diet DVT Prophylaxis: SCD, pharmacological prophylaxis contraindicated due to thrombocytopenia    Advance goals of care discussion: Full code  Family Communication: family was present at bedside, at the time of interview.  The pt provided permission to discuss medical plan with the family. Opportunity was given to ask question and all questions were answered satisfactorily.  3/12 Discussed with patient's spouse at bedside.  Disposition:  Pt is from Home, admitted with AMS, still has AMS, which precludes a safe discharge. Discharge to Home, when stable.  Subjective: No significant events overnight, patient was more awake and alert today, AAO x 1 at baseline.  Patient was complaining of pain in her tailbone no any other complaints.  Physical Exam: General: NAD, lying comfortably Appear in no distress, affect appropriate Eyes: PERRLA ENT: Oral Mucosa Clear, moist  Neck: no JVD,  Cardiovascular: S1 and S2 Present, no Murmur,  Respiratory: good respiratory effort, Bilateral Air entry equal and Decreased, no Crackles, no wheezes Abdomen: Bowel Sound present, Soft and no tenderness,  Skin: no rashes Extremities: no Pedal edema, no calf tenderness Neurologic: without any new focal findings Gait not checked due to patient safety concerns  Vitals:   04/16/22 2326 04/17/22 0434 04/17/22 0828 04/17/22 1145  BP: (!) 97/46 127/66 122/66 124/70  Pulse: 92 86 83 (!) 108  Resp: '16 18 16 16  '$ Temp: 97.7 F (36.5 C) 98.4  F (36.9 C) (!) 97.5 F (36.4 C) 98.2 F (36.8 C)  TempSrc: Oral Oral Oral Oral  SpO2: 100% 100% 100% 93%  Weight:      Height:        Intake/Output Summary (Last 24 hours) at  04/17/2022 1502 Last data filed at 04/17/2022 1200 Gross per 24 hour  Intake 1106.02 ml  Output 375 ml  Net 731.02 ml   Filed Weights   04/11/22 1237 04/11/22 1832  Weight: 62 kg 59 kg    Data Reviewed: I have personally reviewed and interpreted daily labs, tele strips, imagings as discussed above. I reviewed all nursing notes, pharmacy notes, vitals, pertinent old records I have discussed plan of care as described above with RN and patient/family.  CBC: Recent Labs  Lab 04/11/22 1252 04/12/22 0444 04/13/22 0059 04/13/22 0334 04/13/22 1350 04/14/22 0446 04/15/22 0443 04/16/22 0848 04/17/22 0412  WBC 9.3   < > 8.3 8.3  --  6.1 6.6 7.2 7.6  NEUTROABS 6.5  --  4.5  --   --   --   --  5.0  --   HGB 10.1*   < > 6.8* 6.7* 8.7* 8.5* 9.1* 9.6* 9.0*  HCT 34.5*   < > 22.6* 22.5* 28.0* 26.8* 30.0* 31.4* 28.9*  MCV 82.3   < > 80.7 80.4  --  80.2 82.4 83.5 81.0  PLT 141*   < > 65* 64*  --  53* 51* 77* 109*   < > = values in this interval not displayed.   Basic Metabolic Panel: Recent Labs  Lab 04/13/22 0059 04/13/22 0334 04/14/22 0446 04/14/22 1037 04/15/22 0443 04/16/22 0848 04/17/22 0412  NA 140 140 143 144 141 142 141  K 3.7 3.4* 5.9* 4.3 4.2 3.4* 3.3*  CL 108 109 117* 114* 113* 111 111  CO2 '23 22 22 25 '$ 21* 23 23  GLUCOSE 74 124* 299* 97 83 77 91  BUN 44* 44* 30* 28* 24* 18 15  CREATININE 2.40* 2.28* 1.50* 1.44* 1.17* 1.05* 1.06*  CALCIUM 8.2* 8.2* 7.5* 8.0* 7.7* 8.1* 7.8*  MG 1.4* 1.4* 2.2  --  2.1 1.7 1.6*  PHOS 2.5  --  1.8*  --  4.6 2.2* 2.9    Studies: No results found.  Scheduled Meds:  (feeding supplement) PROSource Plus  30 mL Oral TID BM   allopurinol  50 mg Oral Daily   atorvastatin  80 mg Oral QHS   donepezil  10 mg Oral QHS   famotidine  20 mg Oral Daily   folic acid  1 mg Oral Daily   gabapentin  100 mg Oral BID   lacosamide  100 mg Oral BID   levothyroxine  100 mcg Oral QAC breakfast   memantine  10 mg Oral BID   midodrine  5 mg Oral TID WC    mirtazapine  7.5 mg Oral QHS   mometasone-formoterol  2 puff Inhalation BID   multivitamin with minerals  1 tablet Oral Daily   Vitamin D (Ergocalciferol)  50,000 Units Oral Q7 days   Continuous Infusions:   PRN Meds: hydrOXYzine, ipratropium-albuterol, ondansetron (ZOFRAN) IV, polyvinyl alcohol, senna-docusate, traZODone  Time spent: 35 minutes  Author: Val Riles. MD [facility] 04/17/2022 3:02 PM  To reach On-call, see care teams to locate the attending and reach out to them via [email or web address]. If 7PM-7AM, please contact night-coverage If you still have difficulty reaching the attending provider, please page the [facility] (Director on Call) for [facility] on [employer]  for assistance.

## 2022-04-17 NOTE — Progress Notes (Signed)
OT Cancellation Note  Patient Details Name: Stacy Grimes MRN: [id number] DOB: 02/22/1950   Cancelled Treatment:    Reason Eval/Treat Not Completed: Other (comment). Chart reviewed. Upon arrival PT, NT, RN, and family at bedside completing hygiene. Will re-attempt as available.   Dessie Coma, M.S. OTR/L  04/17/22, 3:27 PM  ascom [phone]

## 2022-04-17 NOTE — Progress Notes (Signed)
Nutrition Follow-up  DOCUMENTATION CODES:   Non-severe (moderate) malnutrition in context of chronic illness  INTERVENTION:   -D/c Prosource Plus due to poor acceptance -Continue MVI with minerals daily -Continue Magic cup TID with meals, each supplement provides 290 kcal and 9 grams of protein  -Continue feeding assistance with meals -Ensure Enlive po BID, each supplement provides 350 kcal and 20 grams of protein.   NUTRITION DIAGNOSIS:   Moderate Malnutrition related to chronic illness (dementia) as evidenced by mild fat depletion, moderate fat depletion, mild muscle depletion, moderate muscle depletion, percent weight loss.  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  3/8- s/p BSE- advanced to dysphagia 2 diet with thin liquids   Reviewed I/O's: -475 ml x 24 hours and +2.3 L since admission  UOP: 475 ml x 24 hours   Pt receiving personal care at time of visit.   Per nursing notes, pt with erratic intake. Noted instances of pt refusing foods and medications. Documented meal completions 25-100%.   No new wt documented since admission.   Medications reviewed and include folic acid, remeron, and vitamin D.   Per TOC notes, pt declining SNF recommendation and prefers to discharge home with home health services.   Palliative care consulted for goals of care discussions; pt desires full code and full scope care.   Labs reviewed: K: 3.3, CBGS: 71-96 (inpatient orders for glycemic control are ).    Diet Order:   Diet Order             DIET DYS 2 Room  service appropriate? Yes with Assist; Fluid consistency: Thin  Diet effective now                   EDUCATION NEEDS:   Education needs have been addressed  Skin:  Skin Assessment: Reviewed RN Assessment  Last BM:  04/16/22  Height:   Ht Readings from Last 1 Encounters:  04/11/22 '5\' 3"'$  (1.6 m)    Weight:   Wt Readings from Last 1 Encounters:  04/11/22 59 kg    Ideal Body Weight:  52.3 kg  BMI:  Body mass index is 23.03 kg/m.  Estimated Nutritional Needs:   Kcal:  [id number]  Protein:  90-105 grams  Fluid:  > 1.7 L    Loistine Chance, RD, LDN, [place] Registered Dietitian II Certified Diabetes Care and Education Specialist Please refer to [facility] for RD and/or RD on-call/weekend/after hours pager

## 2022-04-18 ENCOUNTER — Other Ambulatory Visit (HOSPITAL_COMMUNITY): Payer: Self-pay

## 2022-04-18 ENCOUNTER — Telehealth (HOSPITAL_COMMUNITY): Payer: Self-pay | Admitting: Pharmacy Technician

## 2022-04-18 DIAGNOSIS — R410 Disorientation, unspecified: Secondary | ICD-10-CM | POA: Diagnosis not present

## 2022-04-18 LAB — BASIC METABOLIC PANEL
Anion gap: 4 — ABNORMAL LOW (ref 5–15)
BUN: 13 mg/dL (ref 8–23)
CO2: 24 mmol/L (ref 22–32)
Calcium: 8 mg/dL — ABNORMAL LOW (ref 8.9–10.3)
Chloride: 112 mmol/L — ABNORMAL HIGH (ref 98–111)
Creatinine, Ser: 1.17 mg/dL — ABNORMAL HIGH (ref 0.44–1.00)
GFR, Estimated: 50 mL/min — ABNORMAL LOW (ref >60)
Glucose, Bld: 78 mg/dL (ref 70–99)
Potassium: 3.7 mmol/L (ref 3.5–5.1)
Sodium: 140 mmol/L (ref 135–145)

## 2022-04-18 LAB — CBC
HCT: 29.6 % — ABNORMAL LOW (ref 36.0–46.0)
Hemoglobin: 9.4 g/dL — ABNORMAL LOW (ref 12.0–15.0)
MCH: 25.8 pg — ABNORMAL LOW (ref 26.0–34.0)
MCHC: 31.8 g/dL (ref 30.0–36.0)
MCV: 81.3 fL (ref 80.0–100.0)
Platelets: 141 10*3/uL — ABNORMAL LOW (ref 150–400)
RBC: 3.64 MIL/uL — ABNORMAL LOW (ref 3.87–5.11)
RDW: 21.2 % — ABNORMAL HIGH (ref 11.5–15.5)
WBC: 7.1 10*3/uL (ref 4.0–10.5)
nRBC: 0 % (ref 0.0–0.2)

## 2022-04-18 LAB — MAGNESIUM: Magnesium: 2.2 mg/dL (ref 1.7–2.4)

## 2022-04-18 LAB — PHOSPHORUS: Phosphorus: 3.8 mg/dL (ref 2.5–4.6)

## 2022-04-18 LAB — GLUCOSE, CAPILLARY
Glucose-Capillary: 84 mg/dL (ref 70–99)
Glucose-Capillary: 82 mg/dL (ref 70–99)
Glucose-Capillary: 91 mg/dL (ref 70–99)
Glucose-Capillary: 78 mg/dL (ref 70–99)
Glucose-Capillary: 88 mg/dL (ref 70–99)

## 2022-04-18 MED ORDER — ORAL CARE MOUTH RINSE: 15.0000 mL | OROMUCOSAL | Status: DC | PRN

## 2022-04-18 MED ORDER — ORAL CARE MOUTH RINSE
15.0000 mL | OROMUCOSAL | Status: DC
  Administered 2022-04-18 – 2022-04-25 (×27): 15 mL via OROMUCOSAL

## 2022-04-18 NOTE — Progress Notes (Signed)
   04/18/22 1800  What Happened  Was fall witnessed? No  Was patient injured? No  Patient found on floor  Found by Staff-comment  Stated prior activity other (comment) (reaching over rails for drink that was on floor)  Provider Notification  Provider Name/Title Val Riles  Date Provider Notified 04/18/22  Time Provider Notified 1755  Method of Notification Page  Notification Reason Fall  Provider response No new orders  Date of Provider Response 04/18/22  Time of Provider Response 1800  Follow Up  Family notified Yes - comment (spouse at 21)  Time family notified 1836  Additional tests No  Simple treatment Other (comment) (n/a)  Progress note created (see row info) Yes  Adult Fall Risk Assessment  Risk Factor Category (scoring not indicated) High fall risk per protocol (document High fall risk)  Patient Fall Risk Level High fall risk  Adult Fall Risk Interventions  Required Bundle Interventions *See Row Information* High fall risk - low, moderate, and high requirements implemented  Additional Interventions Room near nurses station;Use of appropriate toileting equipment (bedpan, BSC, etc.);Other (Comment) (telesitter)  Screening for Fall Injury Risk (To be completed on HIGH fall risk patients) - Assessing Need for Floor Mats  Risk For Fall Injury- Criteria for Floor Mats Confusion/dementia (+NuDESC, CIWA, TBI, etc.)  Will Implement Floor Mats Yes (none to be found in room)  Pain Assessment  Pain Scale 0-10  Pain Score 0  Neurological  Neuro (WDL) X  Level of Consciousness Alert  Orientation Level Disoriented X4  Cognition Poor attention/concentration;Poor judgement;Poor safety awareness;Memory impairment  Speech Slurred/Dysarthria  R Pupil Size (mm) 2  R Pupil Shape Round  R Pupil Reaction Brisk  L Pupil Size (mm) 2  L Pupil Shape Round  L Pupil Reaction Brisk  RUE Motor Response Purposeful movement;Responds to commands  RUE Motor Strength 4  LUE Motor  Response Purposeful movement;Responds to commands  LUE Motor Strength 4  RLE Motor Response Purposeful movement;Responds to commands  RLE Motor Strength 4  LLE Motor Response Purposeful movement;Responds to commands  LLE Motor Strength 4  Neuro Symptoms Drowsiness;Forgetful  Neuro symptoms relieved by Rest  Glasgow Coma Scale  Eye Opening 4  Best Verbal Response (NON-intubated) 4  Best Motor Response 6  Glasgow Coma Scale Score 14  Musculoskeletal  Musculoskeletal (WDL) X  Assistive Device None  Generalized Weakness Yes  Weight Bearing Restrictions No  Integumentary  Integumentary (WDL) X  Skin Color Appropriate for ethnicity  Skin Condition Dry  Skin Integrity Ecchymosis  Ecchymosis Location Arm  Ecchymosis Location Orientation Bilateral  Skin Turgor Non-tenting

## 2022-04-18 NOTE — Progress Notes (Signed)
Occupational Therapy Treatment Patient Details Name: Stacy Grimes MRN: [id number] DOB: 08/03/1950 Today's Date: 04/18/2022   History of present illness Pt is a 72 year old female with history of stroke, dementia, COPD on chronic 4 L nasal cannula,  cardiomyopathy, seizure disorder, PE, and severe PAH s/p AICD who presents to the emergency department for chief concerns of altered mental status. MD assessment includes: altered mental status, UTI, mild hematuria,  hypoglycemia, dehydration, hypotension, lactic acidosis, AKI, normocytic anemia, thrombocytopenia, hyperkalemia, hyponatremia, hypomagnesemia, hypocalcemia, focal seizures with secondary generalization, and physical deconditioning.   OT comments  [name] was seen for OT treatment on this date. Upon arrival to room pt noted to be soiled with urine/BM. She requires MAX+2 to perform bed level peri-hygiene and linen change. MAX A +2 to come to sitting EOB with minimal of MOD A for sitting balance at EOB during UB grooming. +2 MAX A to return to supine at end of session. Pt making progress toward goals and continues to benefit from skilled OT services to maximize return to PLOF and minimize risk of future falls, injury, caregiver burden, and readmission. Will continue to follow POC. Discharge recommendation updated to STR to maximize pt safety and return to functional independence with ADL management.     Recommendations for follow up therapy are one component of a multi-disciplinary discharge planning process, led by the attending physician.  Recommendations may be updated based on patient status, additional functional criteria and insurance authorization.    Follow Up Recommendations  Skilled nursing-short term rehab (<3 hours/day)     Assistance Recommended at Discharge Frequent or constant Supervision/Assistance  Patient can return home with the following  Two people to help with walking and/or transfers;Two people to help with  bathing/dressing/bathroom;Help with stairs or ramp for entrance   Equipment Recommendations       Recommendations for Other Services      Precautions / Restrictions Precautions Precautions: Fall Precaution Comments: sacral wounds Restrictions Weight Bearing Restrictions: No Other Position/Activity Restrictions: h/o seizures       Mobility Bed Mobility Overal bed mobility: Needs Assistance Bed Mobility: Rolling Rolling: Max assist, +2 for physical assistance   Supine to sit: Max assist, +2 for physical assistance Sit to supine: Mod assist, Max assist        Transfers                   General transfer comment: unsafe due to sitting balance     Balance Overall balance assessment: Needs assistance Sitting-balance support: Bilateral upper extremity supported Sitting balance-Leahy Scale: Poor Sitting balance - Comments: Generally requires MOD A to maintain sitting balance at EOB. Postural control: Right lateral lean     Standing balance comment: Unable to stand                           ADL either performed or assessed with clinical judgement   ADL Overall ADL's : Needs assistance/impaired                                       General ADL Comments: MAX A +2 for bed level peri-hygiene, rolling R<>L for clean up, and to come to sitting at EOB. MOD-MAX A to maintain static sitting balance at EOB during UB grooming.    Extremity/Trunk Assessment  Vision Patient Visual Report: No change from baseline     Perception     Praxis      Cognition Arousal/Alertness: Awake/alert Behavior During Therapy: WFL for tasks assessed/performed Overall Cognitive Status: Impaired/Different from baseline Area of Impairment: Safety/judgement, Awareness, Following commands, Problem solving                   Current Attention Level: Focused   Following Commands: Follows one step commands with increased  time Safety/Judgement: Decreased awareness of safety, Decreased awareness of deficits   Problem Solving: Slow processing, Decreased initiation, Difficulty sequencing, Requires verbal cues, Requires tactile cues General Comments: Continues to be alert to self and place, but has difficulty following multi-step commands.        Exercises Other Exercises Other Exercises: Pt soiled in bed. Needed +2 max assist for rolling and hygiene. Facilitated bed mobility and UB grooming task with +2 assist t/o and education on safe transfer technique. Limited evidence of learning 2/2 pt cognition.    Shoulder Instructions       General Comments      Pertinent Vitals/ Pain       Pain Assessment Faces Pain Scale: Hurts whole lot Pain Location: B LEs, sacral wound pain. Pain Descriptors / Indicators: Aching, Grimacing Pain Intervention(s): Limited activity within patient's tolerance, Monitored during session, Repositioned  Home Living                                          Prior Functioning/Environment              Frequency  Min 2X/week        Progress Toward Goals  OT Goals(current goals can now be found in the care plan section)  Progress towards OT goals: Progressing toward goals  Acute Rehab OT Goals Patient Stated Goal: To go home OT Goal Formulation: With family Time For Goal Achievement: 04/29/22 Potential to Achieve Goals: [place] Discharge plan remains appropriate;Discharge plan needs to be updated    Co-evaluation                 AM-PAC OT "6 Clicks" Daily Activity     Outcome Measure   Help from another person eating meals?: None Help from another person taking care of personal grooming?: A Lot Help from another person toileting, which includes using toliet, bedpan, or urinal?: Total Help from another person bathing (including washing, rinsing, drying)?: Total Help from another person to put on and taking off regular upper body  clothing?: A Lot Help from another person to put on and taking off regular lower body clothing?: Total 6 Click Score: 11    End of Session Equipment Utilized During Treatment: Gait belt;Rolling walker (2 wheels)  OT Visit Diagnosis: Other abnormalities of gait and mobility (R26.89)   Activity Tolerance Patient tolerated treatment well   Patient Left in bed;with call bell/phone within reach;with nursing/sitter in room;with family/visitor present   Nurse Communication          Time: [id number] OT Time Calculation (min): 31 min  Charges: OT General Charges $OT Visit: 1 Visit OT Treatments $Self Care/Home Management : 8-22 mins  Shara Blazing, M.S., OTR/L 04/18/22, 3:40 PM

## 2022-04-18 NOTE — Telephone Encounter (Signed)
Patient Advocate Encounter  Prior Authorization for [employer] '50MG'$  tablets has been approved.    PA# [id number] Key: [place] Electronic PA Form Effective dates: 04/18/2022 through 02/03/2023  Patients co-pay is $445.87.     Lyndel Safe, [place] Patient Advocate Specialist [place] Patient Advocate Team Direct Number: [phone]  Fax: [phone]

## 2022-04-18 NOTE — Progress Notes (Signed)
Physical Therapy Treatment Patient Details Name: Stacy Grimes MRN: [id number] DOB: 1950-11-02 Today's Date: 04/18/2022   History of Present Illness Pt is a 72 year old female with history of stroke, dementia, COPD on chronic 4 L nasal cannula,  cardiomyopathy, seizure disorder, PE, and severe PAH s/p AICD who presents to the emergency department for chief concerns of altered mental status. MD assessment includes: altered mental status, UTI, mild hematuria,  hypoglycemia, dehydration, hypotension, lactic acidosis, AKI, normocytic anemia, thrombocytopenia, hyperkalemia, hyponatremia, hypomagnesemia, hypocalcemia, focal seizures with secondary generalization, and physical deconditioning.    PT Comments    Co-treat performed this date. Prior to OT entering room, supine there-ex performed and noted bed soiled. CNA and RN asked to assist as pt is heavy +2. Both unavailable and OT came for assistance. Pt with sacral wounds and bed required to be changed twice. Pt more alert this date and participated. Able to tolerate multiple bouts of rolling and sitting at EOB for prolonged session this date. Is making gradual progress towards goals. Will continue to progress as able.   Recommendations for follow up therapy are one component of a multi-disciplinary discharge planning process, led by the attending physician.  Recommendations may be updated based on patient status, additional functional criteria and insurance authorization.  Follow Up Recommendations  Skilled nursing-short term rehab (<3 hours/day) Can patient physically be transported by private vehicle: No   Assistance Recommended at Discharge Frequent or constant Supervision/Assistance  Patient can return home with the following Two people to help with walking and/or transfers;Two people to help with bathing/dressing/bathroom;Assistance with cooking/housework;Assistance with feeding;Direct supervision/assist for medications management;Direct  supervision/assist for financial management;Assist for transportation;Help with stairs or ramp for entrance   Equipment Recommendations  Hospital bed    Recommendations for Other Services       Precautions / Restrictions Precautions Precautions: Fall Precaution Comments: sacral wounds Restrictions Weight Bearing Restrictions: No Other Position/Activity Restrictions: h/o seizures     Mobility  Bed Mobility Overal bed mobility: Needs Assistance Bed Mobility: Rolling Rolling: Max assist, +2 for physical assistance   Supine to sit: Max assist, +2 for physical assistance Sit to supine: Mod assist, Max assist   General bed mobility comments: Once seated, forward flexed posture and needs constant mod assist to maintain mobility    Transfers                   General transfer comment: unsafe due to sitting balance    Ambulation/Gait                   Stairs             Wheelchair Mobility    Modified Rankin (Stroke Patients Only)       Balance Overall balance assessment: Needs assistance Sitting-balance support: Bilateral upper extremity supported Sitting balance-Leahy Scale: Poor Sitting balance - Comments: Generally requires MOD A to maintain sitting balance at EOB. Postural control: Right lateral lean                                  Cognition Arousal/Alertness: Awake/alert Behavior During Therapy: WFL for tasks assessed/performed Overall Cognitive Status: Impaired/Different from baseline Area of Impairment: Safety/judgement, Awareness, Following commands, Problem solving                               General Comments: pleasant and agreeable to session  Exercises Other Exercises Other Exercises: more active movement noted this session with B LEs including SLRs, heel slides and 2 active bridges. Other Exercises: Pt soiled in bed. Needed +2 max assist for rolling and hygiene. Facilitated bed mobility  and UB grooming task with +2 assist t/o and education on safe transfer technique. Limited evidence of learning 2/2 pt cognition.    General Comments        Pertinent Vitals/Pain Pain Assessment Pain Assessment: Faces Faces Pain Scale: Hurts whole lot Pain Location: B LEs, sacral wound pain. Pain Descriptors / Indicators: Aching, Grimacing Pain Intervention(s): Limited activity within patient's tolerance, Repositioned    Home Living                          Prior Function            PT Goals (current goals can now be found in the care plan section) Acute Rehab PT Goals Patient Stated Goal: unable to state PT Goal Formulation: With patient Time For Goal Achievement: 04/26/22 Potential to Achieve Goals: Fair Progress towards PT goals: Progressing toward goals    Frequency    Min 2X/week      PT Plan Current plan remains appropriate    Co-evaluation PT/OT/SLP Co-Evaluation/Treatment: Yes Reason for Co-Treatment: Complexity of the patient's impairments (multi-system involvement);For patient/therapist safety;To address functional/ADL transfers PT goals addressed during session: Mobility/safety with mobility        AM-PAC PT "6 Clicks" Mobility   Outcome Measure  Help needed turning from your back to your side while in a flat bed without using bedrails?: Total Help needed moving from lying on your back to sitting on the side of a flat bed without using bedrails?: Total Help needed moving to and from a bed to a chair (including a wheelchair)?: Total Help needed standing up from a chair using your arms (e.g., wheelchair or bedside chair)?: Total Help needed to walk in hospital room?: Total Help needed climbing 3-5 steps with a railing? : Total 6 Click Score: 6    End of Session Equipment Utilized During Treatment: Oxygen Activity Tolerance: Patient limited by pain Patient left: in bed;with call bell/phone within reach;with bed alarm set;with  family/visitor present Nurse Communication: Mobility status PT Visit Diagnosis: Difficulty in walking, not elsewhere classified (R26.2);Muscle weakness (generalized) (M62.81)     Time: [id number] PT Time Calculation (min) (ACUTE ONLY): 41 min  Charges:  $Therapeutic Exercise: 8-22 mins $Therapeutic Activity: 8-22 mins                     Greggory Stallion, PT, DPT, GCS [phone]    Kandra Graven 04/18/2022, 5:02 PM

## 2022-04-18 NOTE — Progress Notes (Signed)
[facility] Progress Note  Patient: Stacy Grimes    [id number]  DOA: 04/11/2022     Date of Service: the patient was seen and examined on 04/18/2022  Chief Complaint  Patient presents with   Altered Mental Status   Brief hospital course: 72 year old F with PMH of dementia, COPD/chronic hypoxic RF on 4 L by [place], CVA, HFrEF with AICD, HTN, anxiety and seizure disorder presenting with altered mental status for 3 days in the setting of possible polypharmacy, dehydration and possible UTI.  Also noted to have hypocalcemia with lactic acidosis, AKI hyponatremia and hyperkalemia.  Digoxin level elevated.  CT head and CXR without acute finding.  Received IV fluid, IV calcium and treatment for hyperkalemia.  Started on IV antibiotics for possible UTI.  Neurology consulted and adjusted medications to minimize polypharmacy    Assessment and Plan: Principal Problem:   Altered mental status Active Problems:   AKI (acute kidney injury) ([place])   Chronic systolic CHF (congestive heart failure) (HCC)   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   Chronic obstructive pulmonary disease, unspecified COPD type ([place])   Chronic respiratory failure with hypoxia (HCC)   Dementia ([place])   Hypertension   Benign neoplasm of colon   Seizures ([place])   Status epilepticus ([place])   Protein calorie malnutrition ([place])   Hypothyroidism   Depression   ICD (implantable cardioverter-defibrillator) in place   Hyperkalemia   Malnutrition of moderate degree  Altered mental status: Multifactorial including dehydration, AKI/azotemia, possible UTI, polypharmacy, underlying dementia.  UA with large LE but rare bacteria and no nitrite.  Unfortunately, urine culture was not sent.  Blood cultures NGTD.  CT head and CXR without acute finding.  Ammonia normal.  B12 elevated.  TSH low but free T4 normal.  Dementia seems to be severe.  She has significant electrolyte derangement likely from dehydration.   Degenerative level is elevated as well.  Seems to be on gabapentin, Remeron and Atarax at home which increases her risk of polypharmacy.  She has no focal neurologic state to suggest CVA. -Treat treatable causes-possible UTI, dehydration and hypotension -Reorientation and delirium precautions Neurology consulted, discontinued Depakote Started Vimpat, recommended to consider starting clonazepam 1 mg p.o. twice daily if needed Continue Remeron 7.5 nightly and Atarax as needed -Resume gabapentin 100 twice daily at reduced dose    History of seizure disorder:  Patient was on Depakote which has been discontinued.   Depakote level 40, below normal range Neurology consulted, started Vimpat 100 mg po bid, recommended to consider clonazepam 1 mg p.o. twice daily if needed.   Watch for any seizure activity, continue seizure precautions 3/12 neurology will evaluate patient again and make changes if needed 3/13 patient's husband agreed to discontinue Vimpat and start Briviact  3/14 as per pharmacy Briviact copayment is very high so patient may not afford.  We will continue Vimpat and will discuss with patient's husband.    Urinary tract infection, mild hematuria: UA with large LE but rare bacteria and no nitrites.  Unfortunately urine culture was not sent.  Blood cultures NGTD. -s/p ceftriaxone empirically given for 6 days, DC'd on 3/12 due to thrombocytopenia    Hypoglycemia/dehydration/hypotension/lactic acidosis: -s/p LR due to lactic acidosis. S/p 0.45 NS   -Discontinue metoprolol. -cortisol level 16.8 WNL and TTE LVEF 50 to 55%, no any other significant findings -Repeat lactic acid. -Use midodrine 5 mg p.o. 3 times daily as needed    AKI: Likely prerenal from dehydration and poor p.o. intake.  Also Lasix  and losartan at home.  CT renal stone study without acute finding.  CK within normal. Continue IV fluid for hydration Creatinine 1.06--1.17 stable -Closely monitor renal functions -Dose  medications renally -Avoid nephrotoxic meds -Strict intake and output.   Normocytic anemia/thrombocytopenia: Notable drop in Hgb.  No obvious bleeding.  Hemodilution?  Folic acid low. -Hb [id number] 8.5 s/p  transfusing 1 unit.   Hb 9.1--9.6--9.4 -Iron profile and B12 within normal range Folic acid deficiency, folate level 3.0, started oral folate supplement. 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  Less likely heparin-induced.   -Check FOBT,    Chronic HFrEF/DCM/severe PAH s/p AICD: Stable.  Dehydrated on presentation.  TTE 10/2021 with LVEF of 40 to 45%, GH, indeterminate DD and severe PASP -Hold diuretics and losartan -Hold metoprolol given hypotension 04/15/2022 TTE shows LVEF 50 to 55%, no any other significant findings -Closely monitor fluid and respiratory status while on IV fluid 3/11 Lopressor 5 mg IV one-time dose given due to persistent tachycardia  Hyperkalemia/hyponatremia: Resolved.   Hypophosphatemia, Phos repleted. Hypokalemia/hypomagnesemia/hypocalcemia -Monitor and replenish as appropriate. -IV fluid as above.   Vitamin D deficiency -Vitamin D 50,000 units weekly    Elevated digoxin levels -Continue holding digoxin Digoxin level 3.2->1.9, trended down    Chronic respiratory failure on 4 L oxygen at baseline  Continue supplemental O2 admission Continue home bronchodilators prn   Dementia with behavioral disturbance:  -Continue donepezil and memantine -See AMS above   Depression: Stable. -Continue hydroxyzine and mirtazapine.  No longer on Effexor.   Hypothyroidism: TSH low but free T4 normal. -Continue home Synthroid   Physical  deconditioning -PT/OT    Moderate malnutrition Body mass index is 23.03 kg/m. Nutrition Problem: Moderate Malnutrition Etiology: chronic illness (dementia) Signs/Symptoms: mild fat depletion, moderate fat depletion, mild muscle depletion, moderate muscle depletion, percent weight loss Interventions: Magic cup, MVI, Prostat   Diet: Dysphagia 2 diet DVT Prophylaxis: SCD, pharmacological prophylaxis contraindicated due to thrombocytopenia    Advance goals of care discussion: Full code  Family Communication: family was present at bedside, at the time of interview.  The pt provided permission to discuss medical plan with the family. Opportunity was given to ask question and all questions were answered satisfactorily.  3/12 Discussed with patient's spouse at bedside.  Disposition:  Pt is from Home, admitted with AMS, still has AMS, which precludes a safe discharge. Discharge to Home, when stable.  Subjective: No significant events overnight, patient was awake but not alert today, was not able to tell me given her name.  She opened her eyes and dozing off.  Seems to be resting comfortably, not in any acute distress.   Physical Exam: General: NAD, lying comfortably Appear in no distress, affect appropriate Eyes: PERRLA ENT: Oral Mucosa Clear, moist  Neck: no JVD,  Cardiovascular: S1 and S2 Present, no Murmur,  Respiratory: good respiratory effort, Bilateral Air entry equal and Decreased, no Crackles, no wheezes Abdomen: Bowel Sound present, Soft and no tenderness,  Skin: no rashes Extremities: no Pedal edema, no calf tenderness Neurologic: without any new focal findings Gait not checked due to patient safety concerns  Vitals:   04/17/22 2333 04/18/22 0344 04/18/22 0842 04/18/22 1122  BP: (!) 140/69 137/67 117/67 (!) 114/51  Pulse: Marland Kitchen)  104 97 (!) 102 (!) 109  Resp: '18 18 16 16  '$ Temp: 98.3 F (36.8 C) 98.7 F (37.1 C) 98.8 F (37.1 C) 97.9 F (36.6 C)  TempSrc: Oral      SpO2: 100% 100% 100% 100%  Weight:  63 kg    Height:        Intake/Output Summary (Last 24 hours) at 04/18/2022 1514 Last data filed at 04/18/2022 1124 Gross per 24 hour  Intake 0 ml  Output 225 ml  Net -225 ml   Filed Weights   04/11/22 1237 04/11/22 1832 04/18/22 0344  Weight: 62 kg 59 kg 63 kg    Data Reviewed: I have personally reviewed and interpreted daily labs, tele strips, imagings as discussed above. I reviewed all nursing notes, pharmacy notes, vitals, pertinent old records I have discussed plan of care as described above with RN and patient/family.  CBC: Recent Labs  Lab 04/13/22 0059 04/13/22 0334 04/14/22 0446 04/15/22 0443 04/16/22 0848 04/17/22 0412 04/18/22 0632  WBC 8.3   < > 6.1 6.6 7.2 7.6 7.1  NEUTROABS 4.5  --   --   --  5.0  --   --   HGB 6.8*   < > 8.5* 9.1* 9.6* 9.0* 9.4*  HCT 22.6*   < > 26.8* 30.0* 31.4* 28.9* 29.6*  MCV 80.7   < > 80.2 82.4 83.5 81.0 81.3  PLT 65*   < > 53* 51* 77* 109* 141*   < > = values in this interval not displayed.   Basic Metabolic Panel: Recent Labs  Lab 04/14/22 0446 04/14/22 1037 04/15/22 0443 04/16/22 0848 04/17/22 0412 04/18/22 0632  NA 143 144 141 142 141 140  K 5.9* 4.3 4.2 3.4* 3.3* 3.7  CL 117* 114* 113* 111 111 112*  CO2 22 25 21* '23 23 24  '$ GLUCOSE 299* 97 83 77 91 78  BUN 30* 28* 24* '18 15 13  '$ CREATININE 1.50* 1.44* 1.17* 1.05* 1.06* 1.17*  CALCIUM 7.5* 8.0* 7.7* 8.1* 7.8* 8.0*  MG 2.2  --  2.1 1.7 1.6* 2.2  PHOS 1.8*  --  4.6 2.2* 2.9 3.8    Studies: No results found.  Scheduled Meds:  allopurinol  50 mg Oral Daily   atorvastatin  80 mg Oral QHS   donepezil  10 mg Oral QHS   famotidine  20 mg Oral Daily   feeding supplement  237 mL Oral BID BM   folic acid  1 mg Oral Daily   gabapentin  100 mg Oral BID   lacosamide  100 mg Oral BID   levothyroxine  100 mcg Oral QAC breakfast   memantine  10 mg Oral BID   mirtazapine  7.5 mg Oral QHS   mometasone-formoterol  2 puff Inhalation BID    multivitamin with minerals  1 tablet Oral Daily   mouth rinse  15 mL Mouth Rinse 4 times per day   Vitamin D (Ergocalciferol)  50,000 Units Oral Q7 days   Continuous Infusions:   PRN Meds: acetaminophen, hydrOXYzine, ipratropium-albuterol, midodrine, ondansetron (ZOFRAN) IV, mouth rinse, oxyCODONE, polyvinyl alcohol, senna-docusate, traZODone  Time spent: 35 minutes  Author: Val Riles. MD [facility] 04/18/2022 3:14 PM  To reach On-call, see care teams to locate the attending and reach out to them via [email or web address]. If 7PM-7AM, please contact night-coverage If you still have difficulty reaching the attending provider, please page the [facility] (Director on Call) for [facility] on amion for assistance.

## 2022-04-19 DIAGNOSIS — R410 Disorientation, unspecified: Secondary | ICD-10-CM | POA: Diagnosis not present

## 2022-04-19 DIAGNOSIS — F03918 Unspecified dementia, unspecified severity, with other behavioral disturbance: Secondary | ICD-10-CM | POA: Diagnosis not present

## 2022-04-19 DIAGNOSIS — R4182 Altered mental status, unspecified: Secondary | ICD-10-CM | POA: Diagnosis not present

## 2022-04-19 DIAGNOSIS — N179 Acute kidney failure, unspecified: Secondary | ICD-10-CM | POA: Diagnosis not present

## 2022-04-19 DIAGNOSIS — E875 Hyperkalemia: Secondary | ICD-10-CM | POA: Diagnosis not present

## 2022-04-19 LAB — CBC
HCT: 27.7 % — ABNORMAL LOW (ref 36.0–46.0)
Hemoglobin: 8.6 g/dL — ABNORMAL LOW (ref 12.0–15.0)
MCH: 25.4 pg — ABNORMAL LOW (ref 26.0–34.0)
MCHC: 31 g/dL (ref 30.0–36.0)
MCV: 82 fL (ref 80.0–100.0)
Platelets: 159 10*3/uL (ref 150–400)
RBC: 3.38 MIL/uL — ABNORMAL LOW (ref 3.87–5.11)
RDW: 21.6 % — ABNORMAL HIGH (ref 11.5–15.5)
WBC: 6.6 10*3/uL (ref 4.0–10.5)
nRBC: 0 % (ref 0.0–0.2)

## 2022-04-19 LAB — BASIC METABOLIC PANEL
Anion gap: 10 (ref 5–15)
BUN: 11 mg/dL (ref 8–23)
CO2: 26 mmol/L (ref 22–32)
Calcium: 8.2 mg/dL — ABNORMAL LOW (ref 8.9–10.3)
Chloride: 109 mmol/L (ref 98–111)
Creatinine, Ser: 1.14 mg/dL — ABNORMAL HIGH (ref 0.44–1.00)
GFR, Estimated: 51 mL/min — ABNORMAL LOW (ref >60)
Glucose, Bld: 87 mg/dL (ref 70–99)
Potassium: 3.7 mmol/L (ref 3.5–5.1)
Sodium: 145 mmol/L (ref 135–145)

## 2022-04-19 LAB — IMMATURE PLATELET FRACTION: Immature Platelet Fraction: 9.9 % — ABNORMAL HIGH (ref 1.2–8.6)

## 2022-04-19 LAB — GLUCOSE, CAPILLARY
Glucose-Capillary: 71 mg/dL (ref 70–99)
Glucose-Capillary: 65 mg/dL — ABNORMAL LOW (ref 70–99)
Glucose-Capillary: 80 mg/dL (ref 70–99)
Glucose-Capillary: 98 mg/dL (ref 70–99)
Glucose-Capillary: 142 mg/dL — ABNORMAL HIGH (ref 70–99)
Glucose-Capillary: 112 mg/dL — ABNORMAL HIGH (ref 70–99)

## 2022-04-19 LAB — PHOSPHORUS: Phosphorus: 3.3 mg/dL (ref 2.5–4.6)

## 2022-04-19 LAB — MAGNESIUM: Magnesium: 2 mg/dL (ref 1.7–2.4)

## 2022-04-19 LAB — LACTATE DEHYDROGENASE: LDH: 265 U/L — ABNORMAL HIGH (ref 98–192)

## 2022-04-19 MED ORDER — LEVETIRACETAM IN NACL 500 MG/100ML IV SOLN
500.0000 mg | Freq: Two times a day (BID) | INTRAVENOUS | Status: DC
  Filled 2022-04-19: qty 100

## 2022-04-19 MED ORDER — DEXTROSE 50 % IV SOLN
12.5000 g | INTRAVENOUS | Status: AC
  Filled 2022-04-19: qty 50

## 2022-04-19 MED ORDER — LEVETIRACETAM IN NACL 500 MG/100ML IV SOLN
500.0000 mg | Freq: Two times a day (BID) | INTRAVENOUS | Status: DC
  Administered 2022-04-19 – 2022-04-22 (×6): 500 mg via INTRAVENOUS
  Filled 2022-04-19 (×6): qty 100

## 2022-04-19 NOTE — Plan of Care (Signed)
Neurology plan of care  Briviact prior auth approved but copay cost is prohibitive (>$450/mo). Dr. Dwyane Dee talked to husband today who is willing to try keppra again at lower dose (she previously had GI side effects on 1000mg  bid). She has been tremulousness and not feeling well since starting the vimpat. We will cross taper as follows:  - Start keppra 500mg  bid x3 days f/b 750mg  bid after that - Continue vimpat 50mg  bid x3 days then stop - I will see the patient in f/u tmrw to see how she is tolerating the initial keppra dose  D/w Dr. Kyung Rudd, MD [facility] [phone]  If [name], please page neurology on call as listed in [place].

## 2022-04-19 NOTE — Progress Notes (Signed)
Palliative Care Progress Note, Assessment & Plan   Patient Name: Stacy Grimes       Date: 04/19/2022 DOB: Nov 28, 1950  Age: 72 y.o. MRN#: [id number] Attending Physician: Val Riles, MD Primary Care Physician: Juluis Pitch, MD Admit Date: 04/11/2022  Subjective: Patient is lying in bed with cardiac monitor in her hand.  She shares that she does not have "any work to do" that she "needs to get out of this process".  She is unable to answer orientation questions appropriately.  No family or friends present at bedside.   HPI: 72 y.o. female  with past medical history of dementia, COPD/chronic hypoxic RF on 4 L by [place], CVA, HFrEF with AICD, HTN, anxiety and seizure disorder presenting with altered mental status for 3 days in the setting of possible polypharmacy, dehydration and possible UTI.     Also noted to have hypocalcemia with lactic acidosis, AKI hyponatremia and hyperkalemia.  Digoxin level elevated.  CT head and CXR without acute finding.     Received IV fluid, IV calcium and treatment for hyperkalemia.  Started on IV antibiotics for possible UTI.     Neurology consulted and adjusted medications to minimize polypharmacy.   PMT was consulted for [place] discussions. Pt is familiar to our team as we have been consulted during previous admissions in Sept 2023 and Dec 2023.   Summary of counseling/coordination of care: After reviewing the patient's chart and assessing the patient at bedside, I spoke with patient's husband Stacy Grimes over the phone in regards to plan and goals of care.  Discussed titration of patient's seizure medications and other sedating medications to address patient's AMS. Reviewed functional, nutritional, and cognitive status as significant indicators of patient's prognosis.   I  attempted to elicit a follow-up values and goals important to the patient and family.  Stacy Grimes continues to want full code and full scope.  He is accepting of all offered, available, and appropriate medical interventions to sustain his wife's life.    He also shares that he would never want her to live long-term on machines or have a feeding tube indefinitely.  He continues to say that if she is brain-dead then he would remove all machines and "let her go".  However, he wants to do what he can to treat the treatable until she is brain dead.  Goals are set.  Full code and full scope remain.  PMT will shadow the patient's chart and monitor peripherally. Please reengage with PMT at patient/family's request, if goals change, or if patient's health deteriorates during hospitalization.   Physical Exam Constitutional:      General: She is not in acute distress.    Appearance: She is normal weight. She is not ill-appearing.  HENT:     Head: Normocephalic.     Mouth/Throat:     Mouth: Mucous membranes are moist.  Eyes:     Pupils: Pupils are equal, round, and reactive to light.  Cardiovascular:     Rate and Rhythm: Normal rate.     Pulses: Normal pulses.  Skin:    General: Skin is warm and dry.     Coloration: Skin is pale.  Neurological:     Mental Status:  She is alert.             Total Time 35 minutes   Delila Kuklinski L. Ilsa Iha, FNP-BC Palliative Medicine Team Team Phone # [phone]

## 2022-04-19 NOTE — TOC Progression Note (Signed)
Transition of Care [facility]) - Progression Note    Patient Details  Name: Stacy Grimes MRN: [id number] Date of Birth: February 05, 1950  [facility] CM/SW [place], LCSW Phone Number: 04/19/2022, 4:23 PM  Clinical Narrative:  Patient was only receiving PT from [facility] prior to admission. They can add OT, RN, aide.   Expected Discharge Plan: [place] Barriers to Discharge: Continued Medical Work up  Expected Discharge Plan and Services                                   HH Arranged: PT, OT, RN [facility] Agency: [facility] Date [place]: 04/12/22 Time [place]: [id number] Representative spoke with at [place]: [place] ([place]) Interventions SDOH Screenings   Food Insecurity: No Food Insecurity (03/07/2022)  Housing: Low Risk  (03/07/2022)  Transportation Needs: No Transportation Needs (03/07/2022)  Utilities: Not At Risk (03/07/2022)  Depression (PHQ2-9): Low Risk  (09/01/2019)  Tobacco Use: Medium Risk (04/11/2022)    Readmission Risk Interventions    04/12/2022    1:27 PM 03/08/2022    1:45 PM  Readmission Risk Prevention Plan  Transportation Screening Complete Complete  Medication Review Press photographer) Complete Complete  PCP or Specialist appointment within 3-5 days of discharge Complete Complete  HRI or Home Care Consult Complete Complete  SW Recovery Care/Counseling Consult Complete Complete  Palliative Care Screening Not Complete   [place] Not Complete Complete

## 2022-04-19 NOTE — Progress Notes (Signed)
[facility] Progress Note  Patient: Stacy Grimes    [id number]  DOA: 04/11/2022     Date of Service: the patient was seen and examined on 04/19/2022  Chief Complaint  Patient presents with   Altered Mental Status   Brief hospital course: 72 year old F with PMH of dementia, COPD/chronic hypoxic RF on 4 L by [place], CVA, HFrEF with AICD, HTN, anxiety and seizure disorder presenting with altered mental status for 3 days in the setting of possible polypharmacy, dehydration and possible UTI.  Also noted to have hypocalcemia with lactic acidosis, AKI hyponatremia and hyperkalemia.  Digoxin level elevated.  CT head and CXR without acute finding.  Received IV fluid, IV calcium and treatment for hyperkalemia.  Started on IV antibiotics for possible UTI.  Neurology consulted and adjusted medications to minimize polypharmacy    Assessment and Plan: Principal Problem:   Altered mental status Active Problems:   AKI (acute kidney injury) ([place])   Chronic systolic CHF (congestive heart failure) (HCC)   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   Chronic obstructive pulmonary disease, unspecified COPD type ([place])   Chronic respiratory failure with hypoxia (HCC)   Dementia ([place])   Hypertension   Benign neoplasm of colon   Seizures ([place])   Status epilepticus ([place])   Protein calorie malnutrition ([place])   Hypothyroidism   Depression   ICD (implantable cardioverter-defibrillator) in place   Hyperkalemia   Malnutrition of moderate degree  Altered mental status: Multifactorial including dehydration, AKI/azotemia, possible UTI, polypharmacy, underlying dementia.  UA with large LE but rare bacteria and no nitrite.  Unfortunately, urine culture was not sent.  Blood cultures NGTD.  CT head and CXR without acute finding.  Ammonia normal.  B12 elevated.  TSH low but free T4 normal.  Dementia seems to be severe.  She has significant electrolyte derangement likely from dehydration.   Degenerative level is elevated as well.  Seems to be on gabapentin, Remeron and Atarax at home which increases her risk of polypharmacy.  She has no focal neurologic state to suggest CVA. -Treat treatable causes-possible UTI, dehydration and hypotension -Reorientation and delirium precautions Neurology consulted, discontinued Depakote Started Vimpat, recommended to consider starting clonazepam 1 mg p.o. twice daily if needed Continue Remeron 7.5 nightly and Atarax as needed -Resume gabapentin 100 twice daily at reduced dose   History of seizure disorder:  Patient was on Depakote which has been discontinued.   Depakote level 40, below normal range Neurology consulted, started Vimpat 100 mg po bid, recommended to consider clonazepam 1 mg p.o. twice daily if needed.   Watch for any seizure activity, continue seizure precautions 3/12 neurology will evaluate patient again and make changes if needed 3/13 patient's husband agreed to discontinue Vimpat and start Briviact  3/14 as per pharmacy Briviact copayment is very high so patient may not afford.  We will continue Vimpat and will discuss with patient's husband. 3/15 patient husband agreed to start [place] at low-dose and to watch for any side effects.  Discussed with neurology, we will plan to start Keppra    Urinary tract infection, mild hematuria: UA with large LE but rare bacteria and no nitrites.  Unfortunately urine culture was not sent.  Blood cultures NGTD. -s/p ceftriaxone empirically given for 6 days, DC'd on 3/12 due to thrombocytopenia    Hypoglycemia/dehydration/hypotension/lactic acidosis: -s/p LR due to lactic acidosis. S/p 0.45 NS   -Discontinue metoprolol. -cortisol level 16.8 WNL and TTE LVEF 50 to 55%, no any other significant findings -Repeat lactic acid. -  Use midodrine 5 mg p.o. 3 times daily as needed    AKI: Likely prerenal from dehydration and poor p.o. intake.  Also Lasix and losartan at home.  CT renal stone  study without acute finding.  CK within normal. Continue IV fluid for hydration Creatinine 1.06--1.14 stable -Closely monitor renal functions -Dose medications renally -Avoid nephrotoxic meds -Strict intake and output.   Normocytic anemia/thrombocytopenia: Notable drop in Hgb.  No obvious bleeding.  Hemodilution?  Folic acid low. -Hb A999333 8.5 s/p  transfusing 1 unit.   Hb 9.1--9.6--9.4--8.6 -Iron profile and B12 within normal range Folic acid deficiency, folate level 3.0, started oral folate supplement. 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  Less likely heparin-induced.   -Check FOBT,    Chronic HFrEF/DCM/severe PAH s/p AICD: Stable.  Dehydrated on presentation.  TTE 10/2021 with LVEF of 40 to 45%, GH, indeterminate DD and severe PASP -Hold diuretics and losartan -Hold metoprolol given hypotension 04/15/2022 TTE shows LVEF 50 to 55%, no any other significant findings -Closely monitor fluid and respiratory status while on IV fluid 3/11 Lopressor 5 mg IV one-time dose given due to persistent tachycardia  Hyperkalemia/hyponatremia: Resolved.   Hypophosphatemia, Phos repleted. Hypokalemia/hypomagnesemia/hypocalcemia -Monitor and replenish as appropriate. -IV fluid as above.   Vitamin D deficiency -Vitamin D 50,000 units weekly    Elevated digoxin levels -Continue holding digoxin Digoxin level 3.2->1.9, trended down    Chronic respiratory failure on 4 L oxygen at baseline  Continue supplemental O2 admission Continue home bronchodilators prn   Dementia with behavioral disturbance:  -Continue donepezil and memantine -See AMS above   Depression:  Stable. -Continue hydroxyzine and mirtazapine.  No longer on Effexor.   Hypothyroidism: TSH low but free T4 normal. -Continue home Synthroid   Physical deconditioning -PT/OT    Moderate malnutrition Body mass index is 23.03 kg/m. Nutrition Problem: Moderate Malnutrition Etiology: chronic illness (dementia) Signs/Symptoms: mild fat depletion, moderate fat depletion, mild muscle depletion, moderate muscle depletion, percent weight loss Interventions: Magic cup, MVI, Prostat   Diet: Dysphagia 2 diet DVT Prophylaxis: SCD, pharmacological prophylaxis contraindicated due to thrombocytopenia    Advance goals of care discussion: Full code  Family Communication: family was present at bedside, at the time of interview.  The pt provided permission to discuss medical plan with the family. Opportunity was given to ask question and all questions were answered satisfactorily.  3/15 Discussed with patient's spouse at bedside.  Disposition:  Pt is from Home, admitted with AMS, still has AMS, which precludes a safe discharge. Discharge to Home, when stable.  Subjective: No significant events overnight, yesterday evening patient fell out of the bed, no trauma was reported.  Patient is having chronic pain in the right hip area, no hematoma or bruise suspected.  Patient is AAO x 1, little bit more awake and alert as compared to yesterday.  Unable to offer any other complaints.  Seems to be resting comfortably.   Physical Exam: General: NAD, lying comfortably Appear in no distress, affect appropriate Eyes: PERRLA ENT: Oral Mucosa Clear, moist  Neck: no JVD,  Cardiovascular: S1 and S2 Present, no Murmur,  Respiratory: good respiratory effort, Bilateral Air entry equal and Decreased, no Crackles, no wheezes Abdomen: Bowel Sound present, Soft and no tenderness,  Skin: no rashes Extremities: no Pedal edema, no calf tenderness Neurologic: without any new focal findings Gait not checked due to  patient safety concerns  Vitals:   04/19/22 0633 04/19/22 0933 04/19/22 1217 04/19/22 1509  BP:  95/81 (!) 112/95 (!) 114/48  Pulse:  98 (!) 108 (!) 102  Resp:  18 16 20   Temp:  97.9 F (36.6 C) (!) 97.4 F (36.3 C) 98.5 F (36.9 C)  TempSrc:   Oral Oral  SpO2:  96% 98% 92%  Weight: 62.1 kg     Height:        Intake/Output Summary (Last 24 hours) at 04/19/2022 1552 Last data filed at 04/19/2022 1501 Gross per 24 hour  Intake 357 ml  Output 450 ml  Net -93 ml   Filed Weights   04/11/22 1832 04/18/22 0344 04/19/22 0633  Weight: 59 kg 63 kg 62.1 kg    Data Reviewed: I have personally reviewed and interpreted daily labs, tele strips, imagings as discussed above. I reviewed all nursing notes, pharmacy notes, vitals, pertinent old records I have discussed plan of care as described above with RN and patient/family.  CBC: Recent Labs  Lab 04/13/22 0059 04/13/22 0334 04/15/22 0443 04/16/22 0848 04/17/22 0412 04/18/22 0632 04/19/22 0540  WBC 8.3   < > 6.6 7.2 7.6 7.1 6.6  NEUTROABS 4.5  --   --  5.0  --   --   --   HGB 6.8*   < > 9.1* 9.6* 9.0* 9.4* 8.6*  HCT 22.6*   < > 30.0* 31.4* 28.9* 29.6* 27.7*  MCV 80.7   < > 82.4 83.5 81.0 81.3 82.0  PLT 65*   < > 51* 77* 109* 141* 159   < > = values in this interval not displayed.   Basic Metabolic Panel: Recent Labs  Lab 04/15/22 0443 04/16/22 0848 04/17/22 0412 04/18/22 0632 04/19/22 0540  NA 141 142 141 140 145  K 4.2 3.4* 3.3* 3.7 3.7  CL 113* 111 111 112* 109  CO2 21* 23 23 24 26   GLUCOSE 83 77 91 78 87  BUN 24* 18 15 13 11   CREATININE 1.17* 1.05* 1.06* 1.17* 1.14*  CALCIUM 7.7* 8.1* 7.8* 8.0* 8.2*  MG 2.1 1.7 1.6* 2.2 2.0  PHOS 4.6 2.2* 2.9 3.8 3.3    Studies: No results found.  Scheduled Meds:  allopurinol  50 mg Oral Daily   atorvastatin  80 mg Oral QHS   dextrose  12.5 g Intravenous STAT   donepezil  10 mg Oral QHS   famotidine  20 mg Oral Daily   feeding supplement  237 mL Oral BID BM   folic  acid  1 mg Oral Daily   gabapentin  100 mg Oral BID   lacosamide  100 mg Oral BID   levothyroxine  100 mcg Oral QAC breakfast   memantine  10 mg Oral BID   mirtazapine  7.5 mg Oral QHS   mometasone-formoterol  2 puff Inhalation BID   multivitamin with minerals  1 tablet Oral Daily   mouth rinse  15 mL Mouth Rinse 4 times per day   Vitamin D (Ergocalciferol)  50,000 Units Oral Q7 days   Continuous Infusions:   PRN Meds: acetaminophen, hydrOXYzine, ipratropium-albuterol, midodrine, ondansetron (ZOFRAN) IV, mouth rinse, oxyCODONE, polyvinyl alcohol, senna-docusate, traZODone  Time spent: 35 minutes  Author: Val Riles. MD [facility] 04/19/2022 3:52 PM  To reach On-call, see care teams to locate the attending and reach out to them  via [email or web address]. If 7PM-7AM, please contact night-coverage If you still have difficulty reaching the attending provider, please page the [facility] (Director on Call) for [facility] on amion for assistance.

## 2022-04-20 ENCOUNTER — Inpatient Hospital Stay: Payer: Medicare HMO

## 2022-04-20 DIAGNOSIS — R410 Disorientation, unspecified: Secondary | ICD-10-CM | POA: Diagnosis not present

## 2022-04-20 LAB — GLUCOSE, CAPILLARY
Glucose-Capillary: 107 mg/dL — ABNORMAL HIGH (ref 70–99)
Glucose-Capillary: 121 mg/dL — ABNORMAL HIGH (ref 70–99)
Glucose-Capillary: 79 mg/dL (ref 70–99)
Glucose-Capillary: 83 mg/dL (ref 70–99)
Glucose-Capillary: 85 mg/dL (ref 70–99)
Glucose-Capillary: 85 mg/dL (ref 70–99)
Glucose-Capillary: 92 mg/dL (ref 70–99)

## 2022-04-20 MED ORDER — LACOSAMIDE 10 MG/ML PO SOLN
50.0000 mg | Freq: Two times a day (BID) | ORAL | Status: DC
  Administered 2022-04-21: 50 mg via ORAL
  Filled 2022-04-20: qty 5

## 2022-04-20 NOTE — Progress Notes (Signed)
[facility] Progress Note  Patient: Stacy Grimes    [id number]  DOA: 04/11/2022     Date of Service: the patient was seen and examined on 04/20/2022  Chief Complaint  Patient presents with   Altered Mental Status   Brief hospital course: 72 year old F with PMH of dementia, COPD/chronic hypoxic RF on 4 L by [place], CVA, HFrEF with AICD, HTN, anxiety and seizure disorder presenting with altered mental status for 3 days in the setting of possible polypharmacy, dehydration and possible UTI.  Also noted to have hypocalcemia with lactic acidosis, AKI hyponatremia and hyperkalemia.  Digoxin level elevated.  CT head and CXR without acute finding.  Received IV fluid, IV calcium and treatment for hyperkalemia.  Started on IV antibiotics for possible UTI.  Neurology consulted and adjusted medications to minimize polypharmacy    Assessment and Plan: Principal Problem:   Altered mental status Active Problems:   AKI (acute kidney injury) ([place])   Chronic systolic CHF (congestive heart failure) (HCC)   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   Chronic obstructive pulmonary disease, unspecified COPD type ([place])   Chronic respiratory failure with hypoxia (HCC)   Dementia ([place])   Hypertension   Benign neoplasm of colon   Seizures ([place])   Status epilepticus ([place])   Protein calorie malnutrition ([place])   Hypothyroidism   Depression   ICD (implantable cardioverter-defibrillator) in place   Hyperkalemia   Malnutrition of moderate degree  Altered mental status: Multifactorial including dehydration, AKI/azotemia, possible UTI, polypharmacy, underlying dementia.  UA with large LE but rare bacteria and no nitrite.  Unfortunately, urine culture was not sent.  Blood cultures NGTD.  CT head and CXR without acute finding.  Ammonia normal.  B12 elevated.  TSH low but free T4 normal.  Dementia seems to be severe.  She has significant electrolyte derangement likely from dehydration.   Degenerative level is elevated as well.  Seems to be on gabapentin, Remeron and Atarax at home which increases her risk of polypharmacy.  She has no focal neurologic state to suggest CVA. -Treat treatable causes-possible UTI, dehydration and hypotension -Reorientation and delirium precautions Neurology consulted, discontinued Depakote Started Vimpat, recommended to consider starting clonazepam 1 mg p.o. twice daily if needed Continue Remeron 7.5 nightly and Atarax as needed -Resume gabapentin 100 twice daily at reduced dose   History of seizure disorder:  Patient was on Depakote which has been discontinued.   Depakote level 40, below normal range Neurology consulted, started Vimpat 100 mg po bid, recommended to consider clonazepam 1 mg p.o. twice daily if needed.   Watch for any seizure activity, continue seizure precautions D/w neurology to change medications, plan was to start Briviact but copayment was very high.  Patient's husband agreed to start Keppra at low-dose. 3/15 Started keppra 500mg  bid x3 days f/b 750mg  bid after that - Continue vimpat 50mg  bid x3 days then stop Follow neuro for further recommendation  Urinary tract infection, mild hematuria: UA with large LE but rare bacteria and no nitrites.  Unfortunately urine culture was not sent.  Blood cultures NGTD. -s/p ceftriaxone empirically given for 6 days, DC'd on 3/12 due to thrombocytopenia    Hypoglycemia/dehydration/hypotension/lactic acidosis: -s/p LR due to lactic acidosis. S/p 0.45 NS   -Discontinue metoprolol. -cortisol level 16.8 WNL and TTE LVEF 50 to 55%, no any other significant findings -lactic acid 3.5--2.2 trended down -Use midodrine 5 mg p.o. 3 times daily as needed    AKI: Likely prerenal from dehydration and poor p.o. intake.  Also Lasix and losartan at home.  CT renal stone study without acute finding.  CK within normal. Continue IV fluid for hydration Creatinine 1.06--1.14 stable -Closely monitor  renal functions -Dose medications renally -Avoid nephrotoxic meds -Strict intake and output.   Normocytic anemia/thrombocytopenia: Notable drop in Hgb.  No obvious bleeding.  Hemodilution?  Folic acid low. -Hb A999333 8.5 s/p  transfusing 1 unit.   Hb 9.1--9.6--9.4--8.6 -Iron profile and B12 within normal range Folic acid deficiency, folate level 3.0, started oral folate supplement. -Monitor H&H -Hold subcu heparin and Plavix for now Platelet count 51--77--109--159 gradually improved Haptoglobin 10.5 wnl coagulation profile wnl LDH 200, fibrinogen level 456 LFT wnl  HIT antibody negative  -Monitor H&H and transfuse if hemoglobin less than 7 Hematologist consulted, recommended to discontinue ceftriaxone and continue to monitor.   -Check FOBT,    Chronic HFrEF/DCM/severe PAH s/p AICD: Stable.  Dehydrated on presentation.  TTE 10/2021 with LVEF of 40 to 45%, GH, indeterminate DD and severe PASP -Hold diuretics and losartan -Hold metoprolol given hypotension 04/15/2022 TTE shows LVEF 50 to 55%, no any other significant findings -Closely monitor fluid and respiratory status while on IV fluid 3/11 Lopressor 5 mg IV one-time dose given due to persistent tachycardia  Hyperkalemia/hyponatremia: Resolved.   Hypophosphatemia, Phos repleted. Hypokalemia/hypomagnesemia/hypocalcemia -Monitor and replenish as appropriate. -IV fluid as above.   Vitamin D deficiency -Vitamin D 50,000 units weekly    Elevated digoxin levels -Continue holding digoxin Digoxin level 3.2->1.9, trended down    Chronic respiratory failure on 4 L oxygen at baseline  Continue supplemental O2 admission Continue home bronchodilators prn   Dementia with behavioral disturbance:  -Continue donepezil and memantine -See AMS above   Depression: Stable. -Continue hydroxyzine and mirtazapine.  No longer on Effexor.   Hypothyroidism: TSH low but free T4 normal. -Continue home Synthroid   Physical  deconditioning -PT/OT    Moderate malnutrition Body mass index is 23.03 kg/m. Nutrition Problem: Moderate Malnutrition Etiology: chronic illness (dementia) Signs/Symptoms: mild fat depletion, moderate fat depletion, mild muscle depletion, moderate muscle depletion, percent weight loss Interventions: Magic cup, MVI, Prostat   Diet: Dysphagia 2 diet DVT Prophylaxis: SCD, pharmacological prophylaxis contraindicated due to thrombocytopenia    Advance goals of care discussion: Full code  Family Communication: family was present at bedside, at the time of interview.  The pt provided permission to discuss medical plan with the family. Opportunity was given to ask question and all questions were answered satisfactorily.  3/15 Discussed with patient's spouse at bedside.  Disposition:  Pt is from Home, admitted with AMS, still has AMS, which precludes a safe discharge. Discharge to Home, when stable.  Subjective: No significant events overnight, patient remains at AAOx1 unable to offer any complaints, significant dementia Patient's husband was at bedside, stated that patient is still having a lot of jerking movements and shaking, no active seizures though.  No any side effects from Keppra.   Physical Exam: General: NAD, lying comfortably Appear in no distress, affect appropriate Eyes: PERRLA ENT: Oral Mucosa Clear, moist  Neck: no JVD,  Cardiovascular: S1 and S2 Present, no Murmur,  Respiratory: good respiratory effort, Bilateral Air entry equal and Decreased, no Crackles, no wheezes Abdomen: Bowel Sound present, Soft and no tenderness,  Skin: no rashes Extremities: no Pedal edema, no calf tenderness Neurologic: without any new focal findings Gait not checked due to patient safety concerns  Vitals:   04/20/22 0517 04/20/22 0811 04/20/22 1138 04/20/22 1142  BP:  139/80 (!) 126/51  Pulse:  91 99   Resp:  18 18   Temp:  98.8 F (37.1 C) 98.6 F (37 C)   TempSrc:      SpO2:   100% (!) 89% 92%  Weight: 67.6 kg     Height:        Intake/Output Summary (Last 24 hours) at 04/20/2022 1528 Last data filed at 04/20/2022 1443 Gross per 24 hour  Intake 570.93 ml  Output 250 ml  Net 320.93 ml   Filed Weights   04/18/22 0344 04/19/22 0633 04/20/22 0517  Weight: 63 kg 62.1 kg 67.6 kg    Data Reviewed: I have personally reviewed and interpreted daily labs, tele strips, imagings as discussed above. I reviewed all nursing notes, pharmacy notes, vitals, pertinent old records I have discussed plan of care as described above with RN and patient/family.  CBC: Recent Labs  Lab 04/15/22 0443 04/16/22 0848 04/17/22 0412 04/18/22 0632 04/19/22 0540  WBC 6.6 7.2 7.6 7.1 6.6  NEUTROABS  --  5.0  --   --   --   HGB 9.1* 9.6* 9.0* 9.4* 8.6*  HCT 30.0* 31.4* 28.9* 29.6* 27.7*  MCV 82.4 83.5 81.0 81.3 82.0  PLT 51* 77* 109* 141* [id number]   Basic Metabolic Panel: Recent Labs  Lab 04/15/22 0443 04/16/22 0848 04/17/22 0412 04/18/22 0632 04/19/22 0540  NA 141 142 141 140 145  K 4.2 3.4* 3.3* 3.7 3.7  CL 113* 111 111 112* 109  CO2 21* 23 23 24 26   GLUCOSE 83 77 91 78 87  BUN 24* 18 15 13 11   CREATININE 1.17* 1.05* 1.06* 1.17* 1.14*  CALCIUM 7.7* 8.1* 7.8* 8.0* 8.2*  MG 2.1 1.7 1.6* 2.2 2.0  PHOS 4.6 2.2* 2.9 3.8 3.3    Studies: DG Abd 1 View  Result Date: 04/20/2022 CLINICAL DATA:  Abdominal distention EXAM: ABDOMEN - 1 VIEW COMPARISON:  None Available. FINDINGS: Nonobstructive pattern of bowel gas. No large burden of stool. No free air in the abdomen. Status post intramedullary nail fixation of the right hip. IMPRESSION: Nonobstructive pattern of bowel gas. No large burden of stool. No free air in the abdomen. Electronically Signed   By: Delanna Ahmadi M.D.   On: 04/20/2022 12:56    Scheduled Meds:  allopurinol  50 mg Oral Daily   atorvastatin  80 mg Oral QHS   donepezil  10 mg Oral QHS   famotidine  20 mg Oral Daily   feeding supplement  237 mL Oral BID BM    folic acid  1 mg Oral Daily   gabapentin  100 mg Oral BID   lacosamide  100 mg Oral BID   levothyroxine  100 mcg Oral QAC breakfast   memantine  10 mg Oral BID   mirtazapine  7.5 mg Oral QHS   mometasone-formoterol  2 puff Inhalation BID   multivitamin with minerals  1 tablet Oral Daily   mouth rinse  15 mL Mouth Rinse 4 times per day   Vitamin D (Ergocalciferol)  50,000 Units Oral Q7 days   Continuous Infusions:  levETIRAcetam 500 mg (04/20/22 0928)    PRN Meds: acetaminophen, hydrOXYzine, ipratropium-albuterol, midodrine, ondansetron (ZOFRAN) IV, mouth rinse, oxyCODONE, polyvinyl alcohol, senna-docusate, traZODone  Time spent: 35 minutes  Author: Val Riles. MD [facility] 04/20/2022 3:28 PM  To reach On-call, see care teams to locate the attending and reach out to them via [email or web address]. If 7PM-7AM, please contact night-coverage If you still have difficulty reaching the attending provider, please  page the [facility] (Director on Call) for [facility] on amion for assistance.

## 2022-04-21 DIAGNOSIS — R410 Disorientation, unspecified: Secondary | ICD-10-CM | POA: Diagnosis not present

## 2022-04-21 DIAGNOSIS — G40919 Epilepsy, unspecified, intractable, without status epilepticus: Secondary | ICD-10-CM | POA: Diagnosis not present

## 2022-04-21 LAB — CBC
HCT: 26.2 % — ABNORMAL LOW (ref 36.0–46.0)
Hemoglobin: 8.2 g/dL — ABNORMAL LOW (ref 12.0–15.0)
MCH: 25.3 pg — ABNORMAL LOW (ref 26.0–34.0)
MCHC: 31.3 g/dL (ref 30.0–36.0)
MCV: 80.9 fL (ref 80.0–100.0)
Platelets: 213 10*3/uL (ref 150–400)
RBC: 3.24 MIL/uL — ABNORMAL LOW (ref 3.87–5.11)
RDW: 22.5 % — ABNORMAL HIGH (ref 11.5–15.5)
WBC: 6.9 10*3/uL (ref 4.0–10.5)
nRBC: 0 % (ref 0.0–0.2)

## 2022-04-21 LAB — GLUCOSE, CAPILLARY
Glucose-Capillary: 74 mg/dL (ref 70–99)
Glucose-Capillary: 83 mg/dL (ref 70–99)
Glucose-Capillary: 126 mg/dL — ABNORMAL HIGH (ref 70–99)
Glucose-Capillary: 83 mg/dL (ref 70–99)
Glucose-Capillary: 103 mg/dL — ABNORMAL HIGH (ref 70–99)

## 2022-04-21 LAB — BASIC METABOLIC PANEL
Anion gap: 5 (ref 5–15)
BUN: 10 mg/dL (ref 8–23)
CO2: 25 mmol/L (ref 22–32)
Calcium: 7.6 mg/dL — ABNORMAL LOW (ref 8.9–10.3)
Chloride: 108 mmol/L (ref 98–111)
Creatinine, Ser: 0.97 mg/dL (ref 0.44–1.00)
GFR, Estimated: >60 mL/min (ref >60)
Glucose, Bld: 90 mg/dL (ref 70–99)
Potassium: 3.8 mmol/L (ref 3.5–5.1)
Sodium: 138 mmol/L (ref 135–145)

## 2022-04-21 LAB — PHOSPHORUS: Phosphorus: 2.9 mg/dL (ref 2.5–4.6)

## 2022-04-21 LAB — MAGNESIUM: Magnesium: 1.7 mg/dL (ref 1.7–2.4)

## 2022-04-21 MED ORDER — ENOXAPARIN SODIUM 40 MG/0.4ML IJ SOSY
40.0000 mg | PREFILLED_SYRINGE | Freq: Every evening | INTRAMUSCULAR | Status: DC
  Administered 2022-04-21 – 2022-04-25 (×5): 40 mg via SUBCUTANEOUS
  Filled 2022-04-21 (×5): qty 0.4

## 2022-04-21 MED ORDER — LACOSAMIDE 10 MG/ML PO SOLN
50.0000 mg | Freq: Two times a day (BID) | ORAL | Status: DC
  Administered 2022-04-21 – 2022-04-22 (×2): 50 mg via ORAL
  Filled 2022-04-21 (×2): qty 5

## 2022-04-21 MED ORDER — CLOPIDOGREL BISULFATE 75 MG PO TABS
75.0000 mg | ORAL_TABLET | Freq: Every day | ORAL | Status: DC
  Administered 2022-04-21 – 2022-04-25 (×5): 75 mg via ORAL
  Filled 2022-04-21 (×5): qty 1

## 2022-04-21 NOTE — Progress Notes (Signed)
Neurology Progress Note  Brief seizure HPI: Seizure history - Semiology: Right gaze deviation, secondary generalization - Prodome: Unclear - Post-spell: Lethargy, confusion - Triggers: Missed medications - Frequency: Onset December 2023, recurrent seizures in February 2024 when antiseizure medications were tapered   Prior antiseizure medications Keppra discontinued for concern that it was causing diarrhea (at dose of 1000mg  bid), therefore started on Depakote in February 2024 Patient had persistent thrombocytopenia, likely multifactorial but concern that depakote was contributing therefore switched to vimpat 100mg  bid on 04/14/22   Risk factors:  Multiple prior cortical strokes Remainder of resected history not elicited this encounter  Major interval events:  - Husband concerned that patient became more tremulousness on vimpat and wanted to try alternative medication - Neurology recommended briviact but after PA was approved copay was found to be cost-prohibitive (>$450/mo) - Husband amenable to trying keppra again at a lower dose so cross-taper from vimpat to keppra initiated yesterday to be completed over 3 days  Subjective: - Sleeping on my arrival. Husband is at bedside.  Exam: Vitals:   04/21/22 1123 04/21/22 1738  BP: 123/64 131/63  Pulse: (!) 109 (!) 110  Resp: 16 14  Temp: (!) 97.4 F (36.3 C) 98.2 F (36.8 C)  SpO2: 98% 99%   Gen: In bed, comfortable  Resp: non-labored breathing, no grossly audible wheezing  Neuro MS: Awake, alert, conversant, poor attention/concentration.  Reports she is 72 years old (actually 59 y.o., husband states at baseline reports age in the 21s).  States her name is Stacy Grimes.  Follows some simple commands CN: EOMI grossly, face symmetric, tongue midline, pupils equal and round Motor: Using bilateral upper extremities equally, equally reactive to touch in bilateral upper extremities  Pertinent Labs:  Basic Metabolic Panel: Recent  Labs  Lab 04/16/22 0848 04/17/22 0412 04/18/22 0632 04/19/22 0540 04/21/22 0549  NA 142 141 140 145 138  K 3.4* 3.3* 3.7 3.7 3.8  CL 111 111 112* 109 108  CO2 23 23 24 26 25   GLUCOSE 77 91 78 87 90  BUN 18 15 13 11 10   CREATININE 1.05* 1.06* 1.17* 1.14* 0.97  CALCIUM 8.1* 7.8* 8.0* 8.2* 7.6*  MG 1.7 1.6* 2.2 2.0 1.7  PHOS 2.2* 2.9 3.8 3.3 2.9     CBC: Recent Labs  Lab 04/16/22 0848 04/17/22 0412 04/18/22 0632 04/19/22 0540 04/21/22 0549  WBC 7.2 7.6 7.1 6.6 6.9  NEUTROABS 5.0  --   --   --   --   HGB 9.6* 9.0* 9.4* 8.6* 8.2*  HCT 31.4* 28.9* 29.6* 27.7* 26.2*  MCV 83.5 81.0 81.3 82.0 80.9  PLT 77* 109* 141* 159 213     Coagulation Studies: No results for input(s): "LABPROT", "INR" in the last 72 hours.   Lab Results  Component Value Date   VALPROATE 40 (L) 04/12/2022   EKG without evidence of heart block, does have pacer dependent rhythm with atrial pacing with ventricular response  Assessment: Focal seizures, likely element of toxic/metabolic encephalopathy on top of her dementia and delirium secondary to her acute medical issues including hypoglycemia, hypotension, AKI (resolving), anemia.    Keppra discontinued for concern that it was causing diarrhea (at dose of 1000mg  bid), therefore started on Depakote in February 2024. Patient had persistent thrombocytopenia, likely multifactorial but concern that depakote was contributing therefore switched to vimpat 100mg  bid on 04/14/22. Husband became concerned that patient became more tremulousness on vimpat and wanted to try alternative medication. Neurology recommended briviact (similar to keppra but  lower rate GI side effects) but after PA was approved copay was found to be cost-prohibitive (>$450/mo). Husband amenable to trying keppra again at a lower dose so cross-taper from vimpat to keppra initiated yesterday to be completed over 3 days   Impression: - Continue vimpat 50mg  bid until 3/19 then stop - Continue  keppra 500mg  bid until 3/19 then increase to 750mg  bid - If breakthrough seizures after cross-taper is completed would recommend 2nd trial of keppra at 1000mg  given GI side effects previously occurred in the setting of multiple acute illnesses. If diarrhea recurs on that dose or if husband is not amenable to the dose increase, recommend continuation of keppra at 750mg  bid and adding low dose onfi. Please reconsult neurology if breakthrough seizures occur.  Neurology to be available prn for questions going forward.  Su Monks, MD [facility] [phone]  If 7pm- 7am, please page neurology on call as listed in [place].

## 2022-04-21 NOTE — Progress Notes (Signed)
[facility] Progress Note  Patient: Stacy Grimes    [id number]  DOA: 04/11/2022     Date of Service: the patient was seen and examined on 04/21/2022  Chief Complaint  Patient presents with   Altered Mental Status   Brief hospital course: 72 year old F with PMH of dementia, COPD/chronic hypoxic RF on 4 L by [place], CVA, HFrEF with AICD, HTN, anxiety and seizure disorder presenting with altered mental status for 3 days in the setting of possible polypharmacy, dehydration and possible UTI.  Also noted to have hypocalcemia with lactic acidosis, AKI hyponatremia and hyperkalemia.  Digoxin level elevated.  CT head and CXR without acute finding.  Received IV fluid, IV calcium and treatment for hyperkalemia.  Started on IV antibiotics for possible UTI.  Neurology consulted and adjusted medications to minimize polypharmacy    Assessment and Plan: Principal Problem:   Altered mental status Active Problems:   AKI (acute kidney injury) ([place])   Chronic systolic CHF (congestive heart failure) (HCC)   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   Chronic obstructive pulmonary disease, unspecified COPD type ([place])   Chronic respiratory failure with hypoxia (HCC)   Dementia ([place])   Hypertension   Benign neoplasm of colon   Seizures ([place])   Status epilepticus ([place])   Protein calorie malnutrition ([place])   Hypothyroidism   Depression   ICD (implantable cardioverter-defibrillator) in place   Hyperkalemia   Malnutrition of moderate degree  Altered mental status: Multifactorial including dehydration, AKI/azotemia, possible UTI, polypharmacy, underlying dementia.  UA with large LE but rare bacteria and no nitrite.  Unfortunately, urine culture was not sent.  Blood cultures NGTD.  CT head and CXR without acute finding.  Ammonia normal.  B12 elevated.  TSH low but free T4 normal.  Dementia seems to be severe.  She has significant electrolyte derangement likely from dehydration.   Degenerative level is elevated as well.  Seems to be on gabapentin, Remeron and Atarax at home which increases her risk of polypharmacy.  She has no focal neurologic state to suggest CVA. -Treat treatable causes-possible UTI, dehydration and hypotension -Reorientation and delirium precautions Neurology consulted, discontinued Depakote Started Vimpat, recommended to consider starting clonazepam 1 mg p.o. twice daily if needed Continue Remeron 7.5 nightly and Atarax as needed -Resume gabapentin 100 twice daily at reduced dose   History of seizure disorder:  Patient was on Depakote which has been discontinued.   Depakote level 40, below normal range Neurology consulted, started Vimpat 100 mg po bid, recommended to consider clonazepam 1 mg p.o. twice daily if needed.   Watch for any seizure activity, continue seizure precautions D/w neurology to change medications, plan was to start Briviact but copayment was very high.  Patient's husband agreed to start Keppra at low-dose. 3/15 Started keppra 500mg  bid x3 days f/b 750mg  bid after that - Continue vimpat 50mg  bid x3 days then stop Follow neuro for further recommendation  Urinary tract infection, mild hematuria: UA with large LE but rare bacteria and no nitrites.  Unfortunately urine culture was not sent.  Blood cultures NGTD. -s/p ceftriaxone empirically given for 6 days, DC'd on 3/12 due to thrombocytopenia    Hypoglycemia/dehydration/hypotension/lactic acidosis: -s/p LR due to lactic acidosis. S/p 0.45 NS   -Discontinue metoprolol. -cortisol level 16.8 WNL and TTE LVEF 50 to 55%, no any other significant findings -lactic acid 3.5--2.2 trended down -Use midodrine 5 mg p.o. 3 times daily as needed    AKI: Likely prerenal from dehydration and poor p.o. intake.  Also Lasix and losartan at home.  CT renal stone study without acute finding.  CK within normal. Continue IV fluid for hydration Creatinine 1.06--1.14 stable -Closely monitor  renal functions -Dose medications renally -Avoid nephrotoxic meds -Strict intake and output.   Normocytic anemia/thrombocytopenia: Notable drop in Hgb.  No obvious bleeding.  Hemodilution?  Folic acid low. -Hb [id number] 8.5 s/p  transfusing 1 unit.   Hb 9.1--9.6--9.4--8.2 -Iron profile and B12 within normal range Folic acid deficiency, folate level 3.0, started oral folate supplement. Platelet count 51--77--109--213 gradually improved Haptoglobin 10.5 wnl coagulation profile wnl LDH 200, fibrinogen level 456 LFT wnl  HIT antibody negative  -Monitor H&H and transfuse if hemoglobin less than 7 Hematologist consulted, recommended to discontinue ceftriaxone and continue to monitor.     Chronic HFrEF/DCM/severe PAH s/p AICD: Stable.  Dehydrated on presentation.  TTE 10/2021 with LVEF of 40 to 45%, GH, indeterminate DD and severe PASP -Hold diuretics and losartan -Hold metoprolol given hypotension 04/15/2022 TTE shows LVEF 50 to 55%, no any other significant findings -Closely monitor fluid and respiratory status while on IV fluid 3/11 Lopressor 5 mg IV one-time dose given due to persistent tachycardia  History of CVA 3/17 resumed Plavix, continue Lipitor   Hyperkalemia/hyponatremia: Resolved.   Hypophosphatemia, Phos repleted. Hypokalemia/hypomagnesemia/hypocalcemia -Monitor and replenish as appropriate. -IV fluid as above.   Vitamin D deficiency -Vitamin D 50,000 units weekly    Elevated digoxin levels -Continue holding digoxin Digoxin level 3.2->1.9, trended down    Chronic respiratory failure on 4 L oxygen at baseline  Continue supplemental O2 admission Continue home bronchodilators prn   Dementia with behavioral disturbance:  -Continue donepezil and memantine -See AMS above   Depression: Stable. -Continue hydroxyzine and mirtazapine.  No longer on Effexor.   Hypothyroidism: TSH low but free T4 normal. -Continue home Synthroid   Physical deconditioning -PT/OT     Moderate malnutrition Body mass index is 23.03 kg/m. Nutrition Problem: Moderate Malnutrition Etiology: chronic illness (dementia) Signs/Symptoms: mild fat depletion, moderate fat depletion, mild muscle depletion, moderate muscle depletion, percent weight loss Interventions: Magic cup, MVI, Prostat   Diet: Dysphagia 2 diet DVT Prophylaxis: Subcutaneous Lovenox   Advance goals of care discussion: Full code  Family Communication: family was present at bedside, at the time of interview.  The pt provided permission to discuss medical plan with the family. Opportunity was given to ask question and all questions were answered satisfactorily.  3/15 Discussed with patient's spouse at bedside.  Disposition:  Pt is from Home, admitted with AMS, still has AMS, which precludes a safe discharge. Discharge to Home, when stable.  Subjective: No significant events overnight, patient remains at AAOx1 unable to offer any complaints, significant dementia.    Physical Exam: General: NAD, lying comfortably Appear in no distress, affect appropriate Eyes: PERRLA ENT: Oral Mucosa Clear, moist  Neck: no JVD,  Cardiovascular: S1 and S2 Present, no Murmur,  Respiratory: good respiratory effort, Bilateral Air entry equal and Decreased, no Crackles, no wheezes Abdomen: Bowel Sound present, Soft and no tenderness,  Skin: no rashes Extremities: no Pedal edema, no calf tenderness Neurologic: without any new focal findings Gait not checked due to patient safety concerns  Vitals:   04/21/22 0449 04/21/22 0840 04/21/22 1000 04/21/22 1123  BP:  (!) 106/94  123/64  Pulse:  96  (!) 109  Resp:  16  16  Temp:  97.8 F (36.6 C)  (!) 97.4 F (36.3 C)  TempSrc:      SpO2:  98%  98%  Weight: 65.8 kg  63.8 kg   Height:        Intake/Output Summary (Last 24 hours) at 04/21/2022 1353 Last data filed at 04/21/2022 1127 Gross per 24 hour  Intake 717 ml  Output 600 ml  Net 117 ml   Filed Weights    04/20/22 0517 04/21/22 0449 04/21/22 1000  Weight: 67.6 kg 65.8 kg 63.8 kg    Data Reviewed: I have personally reviewed and interpreted daily labs, tele strips, imagings as discussed above. I reviewed all nursing notes, pharmacy notes, vitals, pertinent old records I have discussed plan of care as described above with RN and patient/family.  CBC: Recent Labs  Lab 04/16/22 0848 04/17/22 0412 04/18/22 0632 04/19/22 0540 04/21/22 0549  WBC 7.2 7.6 7.1 6.6 6.9  NEUTROABS 5.0  --   --   --   --   HGB 9.6* 9.0* 9.4* 8.6* 8.2*  HCT 31.4* 28.9* 29.6* 27.7* 26.2*  MCV 83.5 81.0 81.3 82.0 80.9  PLT 77* 109* 141* 159 [id number]   Basic Metabolic Panel: Recent Labs  Lab 04/16/22 0848 04/17/22 0412 04/18/22 0632 04/19/22 0540 04/21/22 0549  NA 142 141 140 145 138  K 3.4* 3.3* 3.7 3.7 3.8  CL 111 111 112* 109 108  CO2 23 23 24 26 25   GLUCOSE 77 91 78 87 90  BUN 18 15 13 11 10   CREATININE 1.05* 1.06* 1.17* 1.14* 0.97  CALCIUM 8.1* 7.8* 8.0* 8.2* 7.6*  MG 1.7 1.6* 2.2 2.0 1.7  PHOS 2.2* 2.9 3.8 3.3 2.9    Studies: No results found.  Scheduled Meds:  allopurinol  50 mg Oral Daily   atorvastatin  80 mg Oral QHS   donepezil  10 mg Oral QHS   famotidine  20 mg Oral Daily   feeding supplement  237 mL Oral BID BM   folic acid  1 mg Oral Daily   gabapentin  100 mg Oral BID   lacosamide  50 mg Oral BID   levothyroxine  100 mcg Oral QAC breakfast   memantine  10 mg Oral BID   mirtazapine  7.5 mg Oral QHS   mometasone-formoterol  2 puff Inhalation BID   multivitamin with minerals  1 tablet Oral Daily   mouth rinse  15 mL Mouth Rinse 4 times per day   Vitamin D (Ergocalciferol)  50,000 Units Oral Q7 days   Continuous Infusions:  levETIRAcetam 500 mg (04/21/22 0846)    PRN Meds: acetaminophen, hydrOXYzine, ipratropium-albuterol, midodrine, ondansetron (ZOFRAN) IV, mouth rinse, oxyCODONE, polyvinyl alcohol, senna-docusate, traZODone  Time spent: 35 minutes  Author: Val Riles.  MD [facility] 04/21/2022 1:53 PM  To reach On-call, see care teams to locate the attending and reach out to them via [email or web address]. If 7PM-7AM, please contact night-coverage If you still have difficulty reaching the attending provider, please page the [facility] (Director on Call) for [facility] on amion for assistance.

## 2022-04-22 DIAGNOSIS — R410 Disorientation, unspecified: Secondary | ICD-10-CM | POA: Diagnosis not present

## 2022-04-22 LAB — CBC
HCT: 27.1 % — ABNORMAL LOW (ref 36.0–46.0)
Hemoglobin: 8.6 g/dL — ABNORMAL LOW (ref 12.0–15.0)
MCH: 25.6 pg — ABNORMAL LOW (ref 26.0–34.0)
MCHC: 31.7 g/dL (ref 30.0–36.0)
MCV: 80.7 fL (ref 80.0–100.0)
Platelets: 186 10*3/uL (ref 150–400)
RBC: 3.36 MIL/uL — ABNORMAL LOW (ref 3.87–5.11)
RDW: 23.4 % — ABNORMAL HIGH (ref 11.5–15.5)
WBC: 4.8 10*3/uL (ref 4.0–10.5)
nRBC: 0 % (ref 0.0–0.2)

## 2022-04-22 LAB — GLUCOSE, CAPILLARY
Glucose-Capillary: 108 mg/dL — ABNORMAL HIGH (ref 70–99)
Glucose-Capillary: 62 mg/dL — ABNORMAL LOW (ref 70–99)
Glucose-Capillary: 77 mg/dL (ref 70–99)
Glucose-Capillary: 82 mg/dL (ref 70–99)
Glucose-Capillary: 96 mg/dL (ref 70–99)
Glucose-Capillary: 99 mg/dL (ref 70–99)

## 2022-04-22 LAB — BASIC METABOLIC PANEL
Anion gap: 9 (ref 5–15)
BUN: 9 mg/dL (ref 8–23)
CO2: 26 mmol/L (ref 22–32)
Calcium: 8.1 mg/dL — ABNORMAL LOW (ref 8.9–10.3)
Chloride: 107 mmol/L (ref 98–111)
Creatinine, Ser: 1.06 mg/dL — ABNORMAL HIGH (ref 0.44–1.00)
GFR, Estimated: 56 mL/min — ABNORMAL LOW (ref >60)
Glucose, Bld: 80 mg/dL (ref 70–99)
Potassium: 4 mmol/L (ref 3.5–5.1)
Sodium: 142 mmol/L (ref 135–145)

## 2022-04-22 LAB — PHOSPHORUS: Phosphorus: 3.3 mg/dL (ref 2.5–4.6)

## 2022-04-22 LAB — MAGNESIUM: Magnesium: 1.7 mg/dL (ref 1.7–2.4)

## 2022-04-22 MED ORDER — SODIUM CHLORIDE 0.9 % IV SOLN
750.0000 mg | Freq: Two times a day (BID) | INTRAVENOUS | Status: DC
  Administered 2022-04-23: 750 mg via INTRAVENOUS
  Filled 2022-04-22 (×2): qty 7.5

## 2022-04-22 MED ORDER — LEVETIRACETAM IN NACL 500 MG/100ML IV SOLN
500.0000 mg | Freq: Two times a day (BID) | INTRAVENOUS | Status: AC
  Administered 2022-04-22: 500 mg via INTRAVENOUS
  Filled 2022-04-22: qty 100

## 2022-04-22 MED ORDER — LACOSAMIDE 10 MG/ML PO SOLN
50.0000 mg | Freq: Two times a day (BID) | ORAL | Status: AC
  Administered 2022-04-22: 50 mg via ORAL
  Filled 2022-04-22: qty 5

## 2022-04-22 NOTE — Progress Notes (Signed)
Occupational Therapy Treatment Patient Details Name: Stacy Grimes MRN: [id number] DOB: [birth year] Today's Date: 04/22/2022   History of present illness Pt is a 72 year old female with history of stroke, dementia, COPD on chronic 4 L nasal cannula,  cardiomyopathy, seizure disorder, PE, and severe PAH s/p AICD who presents to the emergency department for chief concerns of altered mental status. MD assessment includes: altered mental status, UTI, mild hematuria,  hypoglycemia, dehydration, hypotension, lactic acidosis, AKI, normocytic anemia, thrombocytopenia, hyperkalemia, hyponatremia, hypomagnesemia, hypocalcemia, focal seizures with secondary generalization, and physical deconditioning.   OT comments  Upon entering the room, pt's LEs off EOB and pt calling out for assistance. Pt is pleasantly confused. She is oriented to self only. OT assisted pt to EOB with max A for B LEs and trunk support. Pt seated for ~ 3 minutes with min - mod A for static sitting balance. Pt needing mod multimodal cuing for safety awareness. Pt having difficulty voicing needs but appears to be requesting assistance with comfort. OT assisted pt back to bed with mod A and repositioned for comfort. All needs within reach and bed alarm activated for safety. Pt appears to be drifting off to sleep as therapist exits the room.    Recommendations for follow up therapy are one component of a multi-disciplinary discharge planning process, led by the attending physician.  Recommendations may be updated based on patient status, additional functional criteria and insurance authorization.    Follow Up Recommendations  Skilled nursing-short term rehab (<3 hours/day)     Assistance Recommended at Discharge Frequent or constant Supervision/Assistance  Patient can return home with the following  Two people to help with walking and/or transfers;Two people to help with bathing/dressing/bathroom;Help with stairs or ramp for entrance    Equipment Recommendations  Other (comment) (defer to next venue of care)       Precautions / Restrictions Precautions Precautions: Fall Precaution Comments: sacral wounds Restrictions Weight Bearing Restrictions: No Other Position/Activity Restrictions: h/o seizures       Mobility Bed Mobility Overal bed mobility: Needs Assistance Bed Mobility: Supine to Sit, Sit to Supine     Supine to sit: Max assist Sit to supine: Mod assist   General bed mobility comments: assistance for trunk support and B LEs    Transfers                         Balance Overall balance assessment: Needs assistance Sitting-balance support: Feet supported Sitting balance-Leahy Scale: Poor Sitting balance - Comments: min - mod A for static sitting balance Postural control: Posterior lean                                 ADL either performed or assessed with clinical judgement   ADL Overall ADL's : Needs assistance/impaired                                            Extremity/Trunk Assessment Upper Extremity Assessment Upper Extremity Assessment: Generalized weakness   Lower Extremity Assessment Lower Extremity Assessment: Generalized weakness        Vision Patient Visual Report: No change from baseline            Cognition Arousal/Alertness: Awake/alert Behavior During Therapy: WFL for tasks assessed/performed Overall Cognitive Status: Impaired/Different from baseline  General Comments: Pt is pleasant and conversational but very confused with decreased insight to situation and deficits                   Pertinent Vitals/ Pain       Pain Assessment Pain Assessment: Faces Faces Pain Scale: Hurts even more Pain Location: sacral wounds Pain Descriptors / Indicators: Aching, Grimacing Pain Intervention(s): Limited activity within patient's tolerance, Repositioned         Frequency   Min 2X/week        Progress Toward Goals  OT Goals(current goals can now be found in the care plan section)  Progress towards OT goals: Progressing toward goals     Plan Discharge plan remains appropriate;Discharge plan needs to be updated       AM-PAC OT "6 Clicks" Daily Activity     Outcome Measure   Help from another person eating meals?: A Little Help from another person taking care of personal grooming?: A Lot Help from another person toileting, which includes using toliet, bedpan, or urinal?: Total Help from another person bathing (including washing, rinsing, drying)?: Total Help from another person to put on and taking off regular upper body clothing?: A Lot Help from another person to put on and taking off regular lower body clothing?: Total 6 Click Score: 10    End of Session    OT Visit Diagnosis: Other abnormalities of gait and mobility (R26.89)   Activity Tolerance Patient limited by fatigue   Patient Left in bed;with call bell/phone within reach;with bed alarm set   Nurse Communication          Time: 1435-1446 OT Time Calculation (min): 11 min  Charges: OT General Charges $OT Visit: 1 Visit OT Treatments $Therapeutic Activity: 8-22 mins  Darleen Crocker, MS, OTR/L , CBIS ascom [phone]  04/22/22, 3:42 PM

## 2022-04-22 NOTE — Progress Notes (Signed)
                                                     Palliative Care Progress Note   Patient Name: Stacy Grimes       Date: 04/22/2022 DOB: 1950/12/23  Age: 72 y.o. MRN#: [id number] Attending Physician: Val Riles, MD Primary Care Physician: Juluis Pitch, MD Admit Date: 04/11/2022  Chart reviewed. Full code and full scope remain.   No acute palliative needs today.  PMT will continue to follow.  Thank you for allowing the Palliative Medicine Team to assist in the care of ALEXXIS FLEURIMOND.  [place] Ilsa Iha, FNP-BC Palliative Medicine Team Team Phone # [phone]  NO CHARGE

## 2022-04-22 NOTE — Progress Notes (Signed)
[facility] Progress Note  Patient: Stacy Grimes    [id number]  DOA: 04/11/2022     Date of Service: the patient was seen and examined on 04/22/2022  Chief Complaint  Patient presents with   Altered Mental Status   Brief hospital course: 72 year old F with PMH of dementia, COPD/chronic hypoxic RF on 4 L by [place], CVA, HFrEF with AICD, HTN, anxiety and seizure disorder presenting with altered mental status for 3 days in the setting of possible polypharmacy, dehydration and possible UTI.  Also noted to have hypocalcemia with lactic acidosis, AKI hyponatremia and hyperkalemia.  Digoxin level elevated.  CT head and CXR without acute finding.  Received IV fluid, IV calcium and treatment for hyperkalemia.  Started on IV antibiotics for possible UTI.  Neurology consulted and adjusted medications to minimize polypharmacy    Assessment and Plan: Principal Problem:   Altered mental status Active Problems:   AKI (acute kidney injury) ([place])   Chronic systolic CHF (congestive heart failure) (HCC)   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   Chronic obstructive pulmonary disease, unspecified COPD type ([place])   Chronic respiratory failure with hypoxia (HCC)   Dementia ([place])   Hypertension   Benign neoplasm of colon   Seizures ([place])   Status epilepticus ([employer])   Protein calorie malnutrition ([place])   Hypothyroidism   Depression   ICD (implantable cardioverter-defibrillator) in place   Hyperkalemia   Malnutrition of moderate degree  Altered mental status: Multifactorial including dehydration, AKI/azotemia, possible UTI, polypharmacy, underlying dementia.  UA with large LE but rare bacteria and no nitrite.  Unfortunately, urine culture was not sent.  Blood cultures NGTD.  CT head and CXR without acute finding.  Ammonia normal.  B12 elevated.  TSH low but free T4 normal.  Dementia seems to be severe.  She has significant electrolyte derangement likely from dehydration.   Creatinine level is elevated as well.  Seems to be on gabapentin, Remeron and Atarax at home which increases her risk of polypharmacy.  She has no focal neurologic state to suggest CVA. -Treat treatable causes-possible UTI, dehydration and hypotension -Reorientation and delirium precautions Neurology consulted, discontinued Depakote Started Vimpat, recommended to consider starting clonazepam 1 mg p.o. twice daily if needed Continue Remeron 7.5 nightly and Atarax as needed -Resume gabapentin 100 twice daily at reduced dose   History of seizure disorder:  Patient was on Depakote which has been discontinued.   Depakote level 40, below normal range Neurology consulted, started Vimpat 100 mg po bid, recommended to consider clonazepam 1 mg p.o. twice daily if needed.   Watch for any seizure activity, continue seizure precautions D/w neurology to change medications, plan was to start Briviact but copayment was very high.  Patient's husband agreed to start Keppra at low-dose. 3/15 Started keppra 500mg  bid x3 days f/b 750mg  bid after that - vimpat 50mg  bid x3 days then stop on 3/18 Increase Keppra 750 mg twice daily from tomorrow on 3/19 Follow neuro for further recommendation  Urinary tract infection, mild hematuria: UA with large LE but rare bacteria and no nitrites.  Unfortunately urine culture was not sent.  Blood cultures NGTD. -s/p ceftriaxone empirically given for 6 days, DC'd on 3/12 due to thrombocytopenia    Hypoglycemia/dehydration/hypotension/lactic acidosis: -s/p LR due to lactic acidosis. S/p 0.45 NS   -Discontinue metoprolol. -cortisol level 16.8 WNL and TTE LVEF 50 to 55%, no any other significant findings -lactic acid 3.5--2.2 trended down -Use midodrine 5 mg p.o. 3 times daily as needed  AKI: Likely prerenal from dehydration and poor p.o. intake.  Also Lasix and losartan at home.  CT renal stone study without acute finding.  CK within normal. Continue IV fluid for  hydration Creatinine 1.06  stable -Closely monitor renal functions -Dose medications renally -Avoid nephrotoxic meds -Strict intake and output.   Normocytic anemia/thrombocytopenia: Notable drop in Hgb.  No obvious bleeding.  Hemodilution?  Folic acid low. -Hb [id number] 8.5 s/p  transfusing 1 unit.   Hb 9.1--9.6--9.4--8.2--8.6 -Iron profile and B12 within normal range Folic acid deficiency, folate level 3.0, started oral folate supplement. Platelet count 51--77--109--213--186  Haptoglobin 10.5 wnl coagulation profile wnl LDH 200, fibrinogen level 456 LFT wnl  HIT antibody negative  -Monitor H&H and transfuse if hemoglobin less than 7 Hematologist consulted, recommended to discontinue ceftriaxone and continue to monitor.     Chronic HFrEF/DCM/severe PAH s/p AICD: Stable.  Dehydrated on presentation.  TTE 10/2021 with LVEF of 40 to 45%, GH, indeterminate DD and severe PASP -Hold diuretics and losartan -Hold metoprolol given hypotension 04/15/2022 TTE shows LVEF 50 to 55%, no any other significant findings -Closely monitor fluid and respiratory status while on IV fluid 3/11 Lopressor 5 mg IV one-time dose given due to persistent tachycardia  History of CVA 3/17 resumed Plavix, continue Lipitor   Hyperkalemia/hyponatremia: Resolved.   Hypophosphatemia, Phos repleted. Hypokalemia/hypomagnesemia/hypocalcemia -Monitor and replenish as appropriate. -IV fluid as above.   Vitamin D deficiency -Vitamin D 50,000 units weekly    Elevated digoxin levels -Continue holding digoxin Digoxin level 3.2->1.9, trended down    Chronic respiratory failure on 4 L oxygen at baseline  Continue supplemental O2 admission Continue home bronchodilators prn   Dementia with behavioral disturbance:  -Continue donepezil and memantine -See AMS above   Depression: Stable. -Continue hydroxyzine and mirtazapine.  No longer on Effexor.   Hypothyroidism: TSH low but free T4 normal. -Continue home  Synthroid   Physical deconditioning -PT/OT    Moderate malnutrition Body mass index is 23.03 kg/m. Nutrition Problem: Moderate Malnutrition Etiology: chronic illness (dementia) Signs/Symptoms: mild fat depletion, moderate fat depletion, mild muscle depletion, moderate muscle depletion, percent weight loss Interventions: Magic cup, MVI, Prostat   Diet: Dysphagia 2 diet DVT Prophylaxis: Subcutaneous Lovenox   Advance goals of care discussion: Full code  Family Communication: family was present at bedside, at the time of interview.  The pt provided permission to discuss medical plan with the family. Opportunity was given to ask question and all questions were answered satisfactorily.  3/18 Discussed with patient's spouse at bedside.  Disposition:  Pt is from Home, admitted with AMS, still has AMS, which precludes a safe discharge. Discharge to Home, when stable.  Subjective: No significant events overnight, patient remains at AAOx1 unable to offer any complaints, significant dementia. Discussed with patient's husband at bedside, jerking of bilateral arms is getting better.  Denies any worsening of symptoms, no diarrhea.    Physical Exam: General: NAD, lying comfortably Appear in no distress, affect appropriate Eyes: PERRLA ENT: Oral Mucosa Clear, moist  Neck: no JVD,  Cardiovascular: S1 and S2 Present, no Murmur,  Respiratory: good respiratory effort, Bilateral Air entry equal and Decreased, no Crackles, no wheezes Abdomen: Bowel Sound present, Soft and no tenderness,  Skin: no rashes Extremities: no Pedal edema, no calf tenderness Neurologic: without any new focal findings Gait not checked due to patient safety concerns  Vitals:   04/21/22 1921 04/22/22 0431 04/22/22 0815 04/22/22 1231  BP: 116/67 131/62 112/60 (!) 114/45  Pulse: 98 79 99  100  Resp: 18 16 20 16   Temp: 98.1 F (36.7 C) 98.3 F (36.8 C) 97.7 F (36.5 C) 98.3 F (36.8 C)  TempSrc:    Oral  SpO2:  100% 100% 100% 100%  Weight:  62.9 kg    Height:        Intake/Output Summary (Last 24 hours) at 04/22/2022 1504 Last data filed at 04/22/2022 1424 Gross per 24 hour  Intake 350 ml  Output 400 ml  Net -50 ml   Filed Weights   04/21/22 0449 04/21/22 1000 04/22/22 0431  Weight: 65.8 kg 63.8 kg 62.9 kg    Data Reviewed: I have personally reviewed and interpreted daily labs, tele strips, imagings as discussed above. I reviewed all nursing notes, pharmacy notes, vitals, pertinent old records I have discussed plan of care as described above with RN and patient/family.  CBC: Recent Labs  Lab 04/16/22 0848 04/17/22 0412 04/18/22 0632 04/19/22 0540 04/21/22 0549 04/22/22 0538  WBC 7.2 7.6 7.1 6.6 6.9 4.8  NEUTROABS 5.0  --   --   --   --   --   HGB 9.6* 9.0* 9.4* 8.6* 8.2* 8.6*  HCT 31.4* 28.9* 29.6* 27.7* 26.2* 27.1*  MCV 83.5 81.0 81.3 82.0 80.9 80.7  PLT 77* 109* 141* 159 213 [id number]   Basic Metabolic Panel: Recent Labs  Lab 04/17/22 0412 04/18/22 0632 04/19/22 0540 04/21/22 0549 04/22/22 0538  NA 141 140 145 138 142  K 3.3* 3.7 3.7 3.8 4.0  CL 111 112* 109 108 107  CO2 23 24 26 25 26   GLUCOSE 91 78 87 90 80  BUN 15 13 11 10 9   CREATININE 1.06* 1.17* 1.14* 0.97 1.06*  CALCIUM 7.8* 8.0* 8.2* 7.6* 8.1*  MG 1.6* 2.2 2.0 1.7 1.7  PHOS 2.9 3.8 3.3 2.9 3.3    Studies: No results found.  Scheduled Meds:  allopurinol  50 mg Oral Daily   atorvastatin  80 mg Oral QHS   clopidogrel  75 mg Oral Daily   donepezil  10 mg Oral QHS   enoxaparin (LOVENOX) injection  40 mg Subcutaneous QPM   famotidine  20 mg Oral Daily   feeding supplement  237 mL Oral BID BM   folic acid  1 mg Oral Daily   gabapentin  100 mg Oral BID   lacosamide  50 mg Oral BID   levothyroxine  100 mcg Oral QAC breakfast   memantine  10 mg Oral BID   mirtazapine  7.5 mg Oral QHS   mometasone-formoterol  2 puff Inhalation BID   multivitamin with minerals  1 tablet Oral Daily   mouth rinse  15 mL Mouth  Rinse 4 times per day   Vitamin D (Ergocalciferol)  50,000 Units Oral Q7 days   Continuous Infusions:  [START ON 04/23/2022] levETIRAcetam     levETIRAcetam      PRN Meds: acetaminophen, hydrOXYzine, ipratropium-albuterol, midodrine, ondansetron (ZOFRAN) IV, mouth rinse, oxyCODONE, polyvinyl alcohol, senna-docusate, traZODone  Time spent: 35 minutes  Author: Val Riles. MD [facility] 04/22/2022 3:04 PM  To reach On-call, see care teams to locate the attending and reach out to them via [email or web address]. If 7PM-7AM, please contact night-coverage If you still have difficulty reaching the attending provider, please page the [facility] (Director on Call) for [facility] on amion for assistance.

## 2022-04-22 NOTE — Progress Notes (Signed)
Physical Therapy Treatment Patient Details Name: Stacy Grimes MRN: [id number] DOB: [birth year] Today's Date: 04/22/2022   History of Present Illness Pt is a 72 year old female with history of stroke, dementia, COPD on chronic 4 L nasal cannula,  cardiomyopathy, seizure disorder, PE, and severe PAH s/p AICD who presents to the emergency department for chief concerns of altered mental status. MD assessment includes: altered mental status, UTI, mild hematuria,  hypoglycemia, dehydration, hypotension, lactic acidosis, AKI, normocytic anemia, thrombocytopenia, hyperkalemia, hyponatremia, hypomagnesemia, hypocalcemia, focal seizures with secondary generalization, and physical deconditioning.    PT Comments    Pt is making limited progress towards goals with ability to participate in supine there-ex. Pt with increased pain in sacrum and assisted in repositioning to side. Per husband, pt on chronic 4L of O2 and he still plans to take her home with HHPT. Will continue to progress as able.   Recommendations for follow up therapy are one component of a multi-disciplinary discharge planning process, led by the attending physician.  Recommendations may be updated based on patient status, additional functional criteria and insurance authorization.  Follow Up Recommendations  Skilled nursing-short term rehab (<3 hours/day) Can patient physically be transported by private vehicle: No   Assistance Recommended at Discharge Frequent or constant Supervision/Assistance  Patient can return home with the following Two people to help with walking and/or transfers;Two people to help with bathing/dressing/bathroom;Assistance with cooking/housework;Assistance with feeding;Direct supervision/assist for medications management;Direct supervision/assist for financial management;Assist for transportation;Help with stairs or ramp for entrance   Equipment Recommendations  Hospital bed    Recommendations for Other Services        Precautions / Restrictions Precautions Precautions: Fall Precaution Comments: sacral wounds Restrictions Weight Bearing Restrictions: No Other Position/Activity Restrictions: h/o seizures     Mobility  Bed Mobility Overal bed mobility: Needs Assistance Bed Mobility: Rolling Rolling: Max assist, +2 for physical assistance         General bed mobility comments: able to perform rolling to B sides for changing position and pillow placement. Refused to sit at EOB secondary to sever pain in sacrum    Transfers                   General transfer comment: unable to perform OOB    Ambulation/Gait                   Stairs             Wheelchair Mobility    Modified Rankin (Stroke Patients Only)       Balance                                            Cognition Arousal/Alertness: Awake/alert Behavior During Therapy: WFL for tasks assessed/performed Overall Cognitive Status: Impaired/Different from baseline                                 General Comments: pleasant, however appears in pain with all mobility complaining of sacral wounds. Still appears confused        Exercises Other Exercises Other Exercises: supine ther-ex performed on B LE including heel slides, SLRs, hip abd/add, and AP. Pt more focused and able to participate this date. 10 reps with min assist    General Comments        Pertinent  Vitals/Pain Pain Assessment Pain Assessment: Faces Faces Pain Scale: Hurts whole lot Pain Location: sacral wounds Pain Descriptors / Indicators: Aching, Grimacing Pain Intervention(s): Limited activity within patient's tolerance, Repositioned    Home Living                          Prior Function            PT Goals (current goals can now be found in the care plan section) Acute Rehab PT Goals Patient Stated Goal: unable to state PT Goal Formulation: With patient Time For Goal  Achievement: 04/26/22 Potential to Achieve Goals: Fair Progress towards PT goals: Progressing toward goals    Frequency    Min 2X/week      PT Plan Current plan remains appropriate    Co-evaluation              AM-PAC PT "6 Clicks" Mobility   Outcome Measure  Help needed turning from your back to your side while in a flat bed without using bedrails?: Total Help needed moving from lying on your back to sitting on the side of a flat bed without using bedrails?: Total Help needed moving to and from a bed to a chair (including a wheelchair)?: Total Help needed standing up from a chair using your arms (e.g., wheelchair or bedside chair)?: Total Help needed to walk in hospital room?: Total Help needed climbing 3-5 steps with a railing? : Total 6 Click Score: 6    End of Session Equipment Utilized During Treatment: Oxygen Activity Tolerance: Patient limited by pain Patient left: in bed;with call bell/phone within reach;with bed alarm set;with family/visitor present Nurse Communication: Mobility status PT Visit Diagnosis: Difficulty in walking, not elsewhere classified (R26.2);Muscle weakness (generalized) (M62.81)     Time: [id number] PT Time Calculation (min) (ACUTE ONLY): 15 min  Charges:  $Therapeutic Exercise: 8-22 mins                     Greggory Stallion, PT, DPT, GCS [phone]    Muhammed Teutsch 04/22/2022, 2:26 PM

## 2022-04-23 ENCOUNTER — Telehealth: Payer: Self-pay

## 2022-04-23 DIAGNOSIS — R410 Disorientation, unspecified: Secondary | ICD-10-CM | POA: Diagnosis not present

## 2022-04-23 DIAGNOSIS — R4182 Altered mental status, unspecified: Secondary | ICD-10-CM | POA: Diagnosis not present

## 2022-04-23 LAB — CBC
HCT: 26.1 % — ABNORMAL LOW (ref 36.0–46.0)
Hemoglobin: 8.2 g/dL — ABNORMAL LOW (ref 12.0–15.0)
MCH: 25.6 pg — ABNORMAL LOW (ref 26.0–34.0)
MCHC: 31.4 g/dL (ref 30.0–36.0)
MCV: 81.6 fL (ref 80.0–100.0)
Platelets: 248 10*3/uL (ref 150–400)
RBC: 3.2 MIL/uL — ABNORMAL LOW (ref 3.87–5.11)
RDW: 23.5 % — ABNORMAL HIGH (ref 11.5–15.5)
WBC: 4.4 10*3/uL (ref 4.0–10.5)
nRBC: 0 % (ref 0.0–0.2)

## 2022-04-23 LAB — GLUCOSE, CAPILLARY
Glucose-Capillary: 129 mg/dL — ABNORMAL HIGH (ref 70–99)
Glucose-Capillary: 59 mg/dL — ABNORMAL LOW (ref 70–99)
Glucose-Capillary: 71 mg/dL (ref 70–99)
Glucose-Capillary: 80 mg/dL (ref 70–99)
Glucose-Capillary: 83 mg/dL (ref 70–99)
Glucose-Capillary: 84 mg/dL (ref 70–99)
Glucose-Capillary: 86 mg/dL (ref 70–99)

## 2022-04-23 LAB — BASIC METABOLIC PANEL
Anion gap: 8 (ref 5–15)
BUN: 10 mg/dL (ref 8–23)
CO2: 29 mmol/L (ref 22–32)
Calcium: 7.9 mg/dL — ABNORMAL LOW (ref 8.9–10.3)
Chloride: 108 mmol/L (ref 98–111)
Creatinine, Ser: 1.07 mg/dL — ABNORMAL HIGH (ref 0.44–1.00)
GFR, Estimated: 55 mL/min — ABNORMAL LOW (ref >60)
Glucose, Bld: 79 mg/dL (ref 70–99)
Potassium: 3.7 mmol/L (ref 3.5–5.1)
Sodium: 145 mmol/L (ref 135–145)

## 2022-04-23 LAB — MAGNESIUM: Magnesium: 1.8 mg/dL (ref 1.7–2.4)

## 2022-04-23 LAB — PHOSPHORUS: Phosphorus: 3.6 mg/dL (ref 2.5–4.6)

## 2022-04-23 MED ORDER — LEVETIRACETAM 750 MG PO TABS
750.0000 mg | ORAL_TABLET | Freq: Two times a day (BID) | ORAL | Status: DC
  Administered 2022-04-23 – 2022-04-25 (×4): 750 mg via ORAL
  Filled 2022-04-23 (×5): qty 1

## 2022-04-23 NOTE — Progress Notes (Signed)
[facility] Progress Note  Patient: Stacy Grimes    [id number]  DOA: 04/11/2022     Date of Service: the patient was seen and examined on 04/23/2022  Chief Complaint  Patient presents with   Altered Mental Status   Brief hospital course: 72 year old F with PMH of dementia, COPD/chronic hypoxic RF on 4 L by [place], CVA, HFrEF with AICD, HTN, anxiety and seizure disorder presenting with altered mental status for 3 days in the setting of possible polypharmacy, dehydration and possible UTI.  Also noted to have hypocalcemia with lactic acidosis, AKI hyponatremia and hyperkalemia.  Digoxin level elevated.  CT head and CXR without acute finding.  Received IV fluid, IV calcium and treatment for hyperkalemia.  Started on IV antibiotics for possible UTI.  Neurology consulted and adjusted medications to minimize polypharmacy    Assessment and Plan: Principal Problem:   Altered mental status  Active Problems:   AKI (acute kidney injury) ([place])   Chronic systolic CHF (congestive heart failure) (HCC)   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   Chronic obstructive pulmonary disease, unspecified COPD type ([place])   Chronic respiratory failure with hypoxia (HCC)   Dementia ([place])   Hypertension   Benign neoplasm of colon   Seizures ([place])   Status epilepticus ([place])   Protein calorie malnutrition ([place])   Hypothyroidism   Depression   ICD (implantable cardioverter-defibrillator) in place   Hyperkalemia   Malnutrition of moderate degree  Altered mental status: Multifactorial including dehydration, AKI/azotemia, possible UTI, polypharmacy, underlying dementia.  UA with large LE but rare bacteria and no nitrite.  Unfortunately, urine culture was not sent.  Blood cultures NGTD.  CT head and CXR without acute finding.  Ammonia normal.  B12 elevated.  TSH low but free T4 normal.  Dementia seems to be severe.  She has significant electrolyte derangement likely from dehydration.   Creatinine level is elevated as well.  Seems to be on gabapentin, Remeron and Atarax at home which increases her risk of polypharmacy.  She has no focal neurologic state to suggest CVA. -Treat treatable causes-possible UTI, dehydration and hypotension -Reorientation and delirium precautions Neurology consulted, discontinued Depakote Started Vimpat, recommended to consider starting clonazepam 1 mg p.o. twice daily if needed Continue Remeron 7.5 nightly and Atarax as needed -Resume gabapentin 100 twice daily at reduced dose   History of seizure disorder:  Patient was on Depakote which has been discontinued.   Depakote level 40, below normal range Neurology consulted, started Vimpat 100 mg po bid, recommended to consider clonazepam 1 mg p.o. twice daily if needed.   Watch for any seizure activity, continue seizure precautions D/w neurology to change medications, plan was to start Briviact but copayment was very high.  Patient's husband agreed to start Keppra at low-dose. 3/15 Started keppra 500mg  bid x3 days f/b 750mg  bid after that - vimpat 50mg  bid x3 days then stop on 3/18 on 3/19 Increase Keppra 750 mg IV bid, changed to oral Keppra 750 twice daily from tonight Follow neuro for further recommendation  Urinary tract infection, mild hematuria: UA with large LE but rare bacteria and no nitrites.  Unfortunately urine culture was not sent.  Blood cultures NGTD. -s/p ceftriaxone empirically given for 6 days, DC'd on 3/12 due to thrombocytopenia    Hypoglycemia/dehydration/hypotension/lactic acidosis: -s/p LR due to lactic acidosis. S/p 0.45 NS   -Discontinue metoprolol. -cortisol level 16.8 WNL and TTE LVEF 50 to 55%, no any other significant findings -lactic acid 3.5--2.2 trended down -Use midodrine 5 mg  p.o. 3 times daily as needed    AKI: Likely prerenal from dehydration and poor p.o. intake.  Also Lasix and losartan at home.  CT renal stone study without acute finding.  CK within  normal. Continue IV fluid for hydration Creatinine 1.06  stable -Closely monitor renal functions -Dose medications renally -Avoid nephrotoxic meds -Strict intake and output.   Normocytic anemia/thrombocytopenia: Notable drop in Hgb.  No obvious bleeding.  Hemodilution?  Folic acid low. -Hb [id number] 8.5 s/p  transfusing 1 unit.   Hb 9.1--9.6--9.4--8.2--8.6--8.2 -Iron profile and B12 within normal range Folic acid deficiency, folate level 3.0, started oral folate supplement. Platelet count 51--77--109--248 Haptoglobin 10.5 wnl coagulation profile wnl LDH 200, fibrinogen level 456 LFT wnl  HIT antibody negative  -Monitor H&H and transfuse if hemoglobin less than 7 Hematologist consulted, recommended to discontinue ceftriaxone and continue to monitor.     Chronic HFrEF/DCM/severe PAH s/p AICD: Stable.  Dehydrated on presentation.  TTE 10/2021 with LVEF of 40 to 45%, GH, indeterminate DD and severe PASP -Hold diuretics and losartan -Hold metoprolol given hypotension 04/15/2022 TTE shows LVEF 50 to 55%, no any other significant findings -Closely monitor fluid and respiratory status while on IV fluid 3/11 Lopressor 5 mg IV one-time dose given due to persistent tachycardia  History of CVA 3/17 resumed Plavix, continue Lipitor   Hyperkalemia/hyponatremia: Resolved.   Hypophosphatemia, Phos repleted. Hypokalemia/hypomagnesemia/hypocalcemia -Monitor and replenish as appropriate. -IV fluid as above.   Vitamin D deficiency -Vitamin D 50,000 units weekly    Elevated digoxin levels -Continue holding digoxin Digoxin level 3.2->1.9, trended down    Chronic respiratory failure on 4 L oxygen at baseline  Continue supplemental O2 admission Continue home bronchodilators prn   Dementia with behavioral disturbance:  -Continue donepezil and memantine -See AMS above   Depression: Stable. -Continue hydroxyzine and mirtazapine.  No longer on Effexor.   Hypothyroidism: TSH low but free T4  normal. -Continue home Synthroid   Physical deconditioning -PT/OT    Moderate malnutrition Body mass index is 23.03 kg/m. Nutrition Problem: Moderate Malnutrition Etiology: chronic illness (dementia) Signs/Symptoms: mild fat depletion, moderate fat depletion, mild muscle depletion, moderate muscle depletion, percent weight loss Interventions: Magic cup, MVI, Prostat   Diet: Dysphagia 2 diet DVT Prophylaxis: Subcutaneous Lovenox   Advance goals of care discussion: Full code  Family Communication: family was present at bedside, at the time of interview.  The pt provided permission to discuss medical plan with the family. Opportunity was given to ask question and all questions were answered satisfactorily.  3/18 Discussed with patient's spouse at bedside.  Disposition:  Pt is from Home, admitted with AMS, still has AMS, which precludes a safe discharge. Discharge to Home, when stable.  Most likely discharge tomorrow a.m.  Subjective: No significant events overnight, patient was complaining of pain all over especially in her lower back.  Patient was resting comfortably, denied any other complaints.   Physical Exam: General: NAD, lying comfortably Appear in no distress, affect appropriate Eyes: PERRLA ENT: Oral Mucosa Clear, moist  Neck: no JVD,  Cardiovascular: S1 and S2 Present, no Murmur,  Respiratory: good respiratory effort, Bilateral Air entry equal and Decreased, no Crackles, no wheezes Abdomen: Bowel Sound present, Soft and no tenderness,  Skin: no rashes Extremities: no Pedal edema, no calf tenderness Neurologic: without any new focal findings Gait not checked due to patient safety concerns  Vitals:   04/23/22 0612 04/23/22 0802 04/23/22 1143 04/23/22 1240  BP: (!) 131/55 125/63 (!) 106/52 114/63  Pulse:  88 88 96 90  Resp: 18 18 18    Temp: 98 F (36.7 C) (!) 97.4 F (36.3 C) 98 F (36.7 C)   TempSrc:  Oral Oral   SpO2: 100% 98% 97%   Weight:      Height:         Intake/Output Summary (Last 24 hours) at 04/23/2022 1314 Last data filed at 04/23/2022 0500 Gross per 24 hour  Intake 120 ml  Output --  Net 120 ml   Filed Weights   04/21/22 0449 04/21/22 1000 04/22/22 0431  Weight: 65.8 kg 63.8 kg 62.9 kg    Data Reviewed: I have personally reviewed and interpreted daily labs, tele strips, imagings as discussed above. I reviewed all nursing notes, pharmacy notes, vitals, pertinent old records I have discussed plan of care as described above with RN and patient/family.  CBC: Recent Labs  Lab 04/18/22 0632 04/19/22 0540 04/21/22 0549 04/22/22 0538 04/23/22 0900  WBC 7.1 6.6 6.9 4.8 4.4  HGB 9.4* 8.6* 8.2* 8.6* 8.2*  HCT 29.6* 27.7* 26.2* 27.1* 26.1*  MCV 81.3 82.0 80.9 80.7 81.6  PLT 141* 159 213 186 [id number]   Basic Metabolic Panel: Recent Labs  Lab 04/18/22 0632 04/19/22 0540 04/21/22 0549 04/22/22 0538 04/23/22 0900  NA 140 145 138 142 145  K 3.7 3.7 3.8 4.0 3.7  CL 112* 109 108 107 108  CO2 24 26 25 26 29   GLUCOSE 78 87 90 80 79  BUN 13 11 10 9 10   CREATININE 1.17* 1.14* 0.97 1.06* 1.07*  CALCIUM 8.0* 8.2* 7.6* 8.1* 7.9*  MG 2.2 2.0 1.7 1.7 1.8  PHOS 3.8 3.3 2.9 3.3 3.6    Studies: No results found.  Scheduled Meds:  allopurinol  50 mg Oral Daily   atorvastatin  80 mg Oral QHS   clopidogrel  75 mg Oral Daily   donepezil  10 mg Oral QHS   enoxaparin (LOVENOX) injection  40 mg Subcutaneous QPM   famotidine  20 mg Oral Daily   feeding supplement  237 mL Oral BID BM   folic acid  1 mg Oral Daily   gabapentin  100 mg Oral BID   levETIRAcetam  750 mg Oral BID   levothyroxine  100 mcg Oral QAC breakfast   memantine  10 mg Oral BID   mirtazapine  7.5 mg Oral QHS   mometasone-formoterol  2 puff Inhalation BID   multivitamin with minerals  1 tablet Oral Daily   mouth rinse  15 mL Mouth Rinse 4 times per day   Vitamin D (Ergocalciferol)  50,000 Units Oral Q7 days   Continuous Infusions:    PRN Meds:  acetaminophen, hydrOXYzine, ipratropium-albuterol, midodrine, ondansetron (ZOFRAN) IV, mouth rinse, oxyCODONE, polyvinyl alcohol, senna-docusate, traZODone  Time spent: 35 minutes  Author: Val Riles. MD [facility] 04/23/2022 1:14 PM  To reach On-call, see care teams to locate the attending and reach out to them via [email or web address]. If 7PM-7AM, please contact night-coverage If you still have difficulty reaching the attending provider, please page the [facility] (Director on Call) for [facility] on amion for assistance.

## 2022-04-23 NOTE — Progress Notes (Signed)
Subjective: In bed, awake and alert with no complaints this evening.   Objective: Current vital signs: BP (!) 128/40 (BP Location: Left Arm)   Pulse 99   Temp 97.8 F (36.6 C)   Resp 18   Ht 5\' 3"  (1.6 m)   Wt 62.9 kg   SpO2 100%   BMI 24.56 kg/m  Vital signs in last 24 hours: Temp:  [97.4 F (36.3 C)-98.5 F (36.9 C)] 97.8 F (36.6 C) (03/19 2049) Pulse Rate:  [82-99] 99 (03/19 2049) Resp:  [17-18] 18 (03/19 2049) BP: (106-131)/(40-83) 128/40 (03/19 2049) SpO2:  [97 %-100 %] 100 % (03/19 2049)  Intake/Output from previous day: 03/18 0701 - 03/19 0700 In: 230 [P.O.:230] Out: -  Intake/Output this shift: No intake/output data recorded. Nutritional status:  Diet Order             DIET DYS 2 Room service appropriate? Yes with Assist; Fluid consistency: Thin  Diet effective now                  HEENT: [place]/AT Lungs: Respirations unlabored  Neurologic Exam: Ment: Awake, alert, conversant, poor attention/concentration. Disoriented to multiple items. Speech is fluent. Able to follow all basic commands.  CN: EOMI, fixates and tracks normally. Face symmetric Motor: Moving bilateral upper extremities equally. Wiggles feet bilaterally but declines to elevate legs due to pain.   Lab Results: Results for orders placed or performed during the hospital encounter of 04/11/22 (from the past 48 hour(s))  Glucose, capillary     Status: Abnormal   Collection Time: 04/22/22 12:36 AM  Result Value Ref Range   Glucose-Capillary 108 (H) 70 - 99 mg/dL    Comment: Glucose reference range applies only to samples taken after fasting for at least 8 hours.  Glucose, capillary     Status: None   Collection Time: 04/22/22  4:34 AM  Result Value Ref Range   Glucose-Capillary 82 70 - 99 mg/dL    Comment: Glucose reference range applies only to samples taken after fasting for at least 8 hours.  Basic metabolic panel     Status: Abnormal   Collection Time: 04/22/22  5:38 AM  Result Value  Ref Range   Sodium 142 135 - 145 mmol/L   Potassium 4.0 3.5 - 5.1 mmol/L   Chloride 107 98 - 111 mmol/L   CO2 26 22 - 32 mmol/L   Glucose, Bld 80 70 - 99 mg/dL    Comment: Glucose reference range applies only to samples taken after fasting for at least 8 hours.   BUN 9 8 - 23 mg/dL   Creatinine, Ser 1.06 (H) 0.44 - 1.00 mg/dL   Calcium 8.1 (L) 8.9 - 10.3 mg/dL   GFR, Estimated 56 (L) >60 mL/min    Comment: (NOTE) Calculated using the CKD-EPI Creatinine Equation (2021)    Anion gap 9 5 - 15    Comment: Performed at [facility], [address] [place], [place] [address]  CBC     Status: Abnormal   Collection Time: 04/22/22  5:38 AM  Result Value Ref Range   WBC 4.8 4.0 - 10.5 K/uL   RBC 3.36 (L) 3.87 - 5.11 MIL/uL   Hemoglobin 8.6 (L) 12.0 - 15.0 g/dL   HCT 27.1 (L) 36.0 - 46.0 %   MCV 80.7 80.0 - 100.0 fL   MCH 25.6 (L) 26.0 - 34.0 pg   MCHC 31.7 30.0 - 36.0 g/dL   RDW 23.4 (H) 11.5 - 15.5 %  Platelets 186 150 - 400 K/uL   nRBC 0.0 0.0 - 0.2 %    Comment: Performed at [facility], [address] [place], [place] [address]  Magnesium     Status: None   Collection Time: 04/22/22  5:38 AM  Result Value Ref Range   Magnesium 1.7 1.7 - 2.4 mg/dL    Comment: Performed at [facility], [address] [place], [place] [address]  Phosphorus     Status: None   Collection Time: 04/22/22  5:38 AM  Result Value Ref Range   Phosphorus 3.3 2.5 - 4.6 mg/dL    Comment: Performed at [facility], [address] [place], [place] [address]  Glucose, capillary     Status: Abnormal   Collection Time: 04/22/22  8:14 AM  Result Value Ref Range   Glucose-Capillary 62 (L) 70 - 99 mg/dL    Comment: Glucose reference range applies only to samples taken after fasting for at least 8 hours.  Glucose, capillary     Status: None   Collection Time: 04/22/22 12:34 PM  Result Value Ref Range   Glucose-Capillary 96 70 - 99 mg/dL    Comment: Glucose reference  range applies only to samples taken after fasting for at least 8 hours.  Glucose, capillary     Status: None   Collection Time: 04/22/22  3:43 PM  Result Value Ref Range   Glucose-Capillary 77 70 - 99 mg/dL    Comment: Glucose reference range applies only to samples taken after fasting for at least 8 hours.  Glucose, capillary     Status: None   Collection Time: 04/22/22  9:14 PM  Result Value Ref Range   Glucose-Capillary 99 70 - 99 mg/dL    Comment: Glucose reference range applies only to samples taken after fasting for at least 8 hours.  Glucose, capillary     Status: None   Collection Time: 04/23/22  1:26 AM  Result Value Ref Range   Glucose-Capillary 83 70 - 99 mg/dL    Comment: Glucose reference range applies only to samples taken after fasting for at least 8 hours.  Glucose, capillary     Status: None   Collection Time: 04/23/22  6:13 AM  Result Value Ref Range   Glucose-Capillary 80 70 - 99 mg/dL    Comment: Glucose reference range applies only to samples taken after fasting for at least 8 hours.  Glucose, capillary     Status: None   Collection Time: 04/23/22  8:23 AM  Result Value Ref Range   Glucose-Capillary 71 70 - 99 mg/dL    Comment: Glucose reference range applies only to samples taken after fasting for at least 8 hours.  Basic metabolic panel     Status: Abnormal   Collection Time: 04/23/22  9:00 AM  Result Value Ref Range   Sodium 145 135 - 145 mmol/L   Potassium 3.7 3.5 - 5.1 mmol/L   Chloride 108 98 - 111 mmol/L   CO2 29 22 - 32 mmol/L   Glucose, Bld 79 70 - 99 mg/dL    Comment: Glucose reference range applies only to samples taken after fasting for at least 8 hours.   BUN 10 8 - 23 mg/dL   Creatinine, Ser 1.07 (H) 0.44 - 1.00 mg/dL   Calcium 7.9 (L) 8.9 - 10.3 mg/dL   GFR, Estimated 55 (L) >60 mL/min    Comment: (NOTE) Calculated using the CKD-EPI Creatinine Equation (2021)    Anion gap 8  5 - 15    Comment: Performed at [facility], [address] [place], [place] [address]  CBC     Status: Abnormal   Collection Time: 04/23/22  9:00 AM  Result Value Ref Range   WBC 4.4 4.0 - 10.5 K/uL   RBC 3.20 (L) 3.87 - 5.11 MIL/uL   Hemoglobin 8.2 (L) 12.0 - 15.0 g/dL   HCT 26.1 (L) 36.0 - 46.0 %   MCV 81.6 80.0 - 100.0 fL   MCH 25.6 (L) 26.0 - 34.0 pg   MCHC 31.4 30.0 - 36.0 g/dL   RDW 23.5 (H) 11.5 - 15.5 %   Platelets 248 150 - 400 K/uL   nRBC 0.0 0.0 - 0.2 %    Comment: Performed at [facility], [address] [place], [place] [address]  Magnesium     Status: None   Collection Time: 04/23/22  9:00 AM  Result Value Ref Range   Magnesium 1.8 1.7 - 2.4 mg/dL    Comment: Performed at [facility], [address], [place], [place] [address]  Phosphorus     Status: None   Collection Time: 04/23/22  9:00 AM  Result Value Ref Range   Phosphorus 3.6 2.5 - 4.6 mg/dL    Comment: Performed at [facility], [address] [place], [place] [address]  Glucose, capillary     Status: None   Collection Time: 04/23/22 11:48 AM  Result Value Ref Range   Glucose-Capillary 84 70 - 99 mg/dL    Comment: Glucose reference range applies only to samples taken after fasting for at least 8 hours.  Glucose, capillary     Status: Abnormal   Collection Time: 04/23/22  4:49 PM  Result Value Ref Range   Glucose-Capillary 129 (H) 70 - 99 mg/dL    Comment: Glucose reference range applies only to samples taken after fasting for at least 8 hours.  Glucose, capillary     Status: None   Collection Time: 04/23/22  8:51 PM  Result Value Ref Range   Glucose-Capillary 86 70 - 99 mg/dL    Comment: Glucose reference range applies only to samples taken after fasting for at least 8 hours.    No results found for this or any previous visit (from the past 240 hour(s)).  Lipid Panel No results for input(s): "CHOL", "TRIG", "HDL", "CHOLHDL", "VLDL", "LDLCALC" in the last 72 hours.  Studies/Results: No results  found.  Medications: Scheduled:  allopurinol  50 mg Oral Daily   atorvastatin  80 mg Oral QHS   clopidogrel  75 mg Oral Daily   donepezil  10 mg Oral QHS   enoxaparin (LOVENOX) injection  40 mg Subcutaneous QPM   famotidine  20 mg Oral Daily   feeding supplement  237 mL Oral BID BM   folic acid  1 mg Oral Daily   gabapentin  100 mg Oral BID   levETIRAcetam  750 mg Oral BID   levothyroxine  100 mcg Oral QAC breakfast   memantine  10 mg Oral BID   mirtazapine  7.5 mg Oral QHS   mometasone-formoterol  2 puff Inhalation BID   multivitamin with minerals  1 tablet Oral Daily   mouth rinse  15 mL Mouth Rinse 4 times per day   Vitamin D (Ergocalciferol)  50,000 Units Oral Q7 days   Assessment: 72 year old female with focal seizures. Also with a likely element of toxic/metabolic encephalopathy on top of her dementia and delirium secondary  to her acute medical issues including hypoglycemia, hypotension, AKI (resolving) and anemia. Seizure medication history: Keppra was initially discontinued for concern that it was causing diarrhea (at dose of 1000mg  bid), therefore started on Depakote in February 2024. Patient had persistent thrombocytopenia, likely multifactorial but concern that depakote was contributing therefore switched to vimpat 100mg  bid on 04/14/22. Husband became concerned that patient became more tremulousness on vimpat and wanted to try alternative medication. Neurology recommended briviact (similar to keppra but lower rate GI side effects) but after PA was approved copay was found to be cost-prohibitive (>$450/mo). Husband amenable to trying keppra again at a lower dose so cross-taper from vimpat to keppra initiated on Saturday to be completed over 3 days - Exam today is stable - No seizure activity reported today.    Recommendations: - Has completed her Vimpat taper  - Increased Keppra to 750 mg bid starting tonight - If breakthrough seizures after cross-taper is completed would  recommend 2nd trial of Keppra at 1000mg  given GI side effects previously occurred in the setting of multiple acute illnesses. If diarrhea recurs on that dose or if husband is not amenable to the dose increase, recommend continuation of Keppra at 750mg  bid and adding low-dose Onfi.  - Please reconsult neurology if breakthrough seizures occur.   LOS: 12 days   @Electronically  signed: Dr. Kerney Elbe 04/23/2022  8:57 PM

## 2022-04-23 NOTE — Telephone Encounter (Signed)
Received voice mail message from husband regarding phone number. He previously reported a new number last week but that number is no longer valid.  Phone number is changed back to original number of [phone].

## 2022-04-23 NOTE — Care Management Important Message (Signed)
Important Message  Patient Details  Name: Stacy Grimes MRN: [id number] Date of Birth: 18-Feb-1950   Medicare Important Message Given:  Yes     [name] 04/23/2022, 11:03 AM

## 2022-04-23 NOTE — Progress Notes (Signed)
Nutrition Follow-up  DOCUMENTATION CODES:   Non-severe (moderate) malnutrition in context of chronic illness  INTERVENTION:   -Continue MVI with minerals daily -Continue Magic cup TID with meals, each supplement provides 290 kcal and 9 grams of protein  -Continue feeding assistance with meals -Continue Ensure Enlive po BID, each supplement provides 350 kcal and 20 grams of protein.   NUTRITION DIAGNOSIS:   Moderate Malnutrition related to chronic illness (dementia) as evidenced by mild fat depletion, moderate fat depletion, mild muscle depletion, moderate muscle depletion, percent weight loss.  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  3/8- s/p BSE- advanced to dysphagia 2 diet with thin liquids   Reviewed I/O's: +230 ml x 24 hours and +2.8 L since admission   Pt sitting up in bed at time of visit. Pt unable to provide history, but fixated on overhead light. No family present.   Pt more alert in comparison to previous visit. Noted meal completions have improved; documented meal intake 40-100%. Pt is also drinking Ensure supplements.   Reviewed wt hx; wt has been stable since admission.   Neurology following and adjusting seizure medications.   Palliative care following for goals of care discussions. Pt husband continues to desire full code and full scope. He would not want to to lives on machines or having a feeding tube indefinitely.   Per TOC notes, plan to discharge home with home health services once medically stable.   Medications reviewed and  include folic acid, keppra, and vitamin D.   Labs reviewed: CBGS: 71-83 (inpatient orders for glycemic control are none).    Diet Order:   Diet Order             DIET DYS 2 Room service appropriate? Yes with Assist; Fluid consistency: Thin  Diet effective now                   EDUCATION NEEDS:   Education needs have been addressed  Skin:  Skin Assessment: Reviewed RN Assessment  Last BM:  04/16/22  Height:   Ht Readings from Last 1 Encounters:  04/11/22 5\' 3"  (1.6 m)    Weight:   Wt Readings from Last 1 Encounters:  04/22/22 62.9 kg    Ideal Body Weight:  52.3 kg  BMI:  Body mass index is 24.56 kg/m.  Estimated Nutritional Needs:   Kcal:  [id number]  Protein:  90-105 grams  Fluid:  > 1.7 L    Loistine Chance, RD, LDN, [place] Registered Dietitian II Certified Diabetes Care and Education Specialist Please refer to [facility] for RD and/or RD on-call/weekend/after hours pager

## 2022-04-24 ENCOUNTER — Other Ambulatory Visit: Payer: Self-pay

## 2022-04-24 DIAGNOSIS — R410 Disorientation, unspecified: Secondary | ICD-10-CM | POA: Diagnosis not present

## 2022-04-24 LAB — BASIC METABOLIC PANEL
Anion gap: 4 — ABNORMAL LOW (ref 5–15)
BUN: 12 mg/dL (ref 8–23)
CO2: 28 mmol/L (ref 22–32)
Calcium: 7.8 mg/dL — ABNORMAL LOW (ref 8.9–10.3)
Chloride: 107 mmol/L (ref 98–111)
Creatinine, Ser: 1.08 mg/dL — ABNORMAL HIGH (ref 0.44–1.00)
GFR, Estimated: 55 mL/min — ABNORMAL LOW (ref >60)
Glucose, Bld: 93 mg/dL (ref 70–99)
Potassium: 3.8 mmol/L (ref 3.5–5.1)
Sodium: 139 mmol/L (ref 135–145)

## 2022-04-24 LAB — GLUCOSE, CAPILLARY
Glucose-Capillary: 100 mg/dL — ABNORMAL HIGH (ref 70–99)
Glucose-Capillary: 100 mg/dL — ABNORMAL HIGH (ref 70–99)
Glucose-Capillary: 115 mg/dL — ABNORMAL HIGH (ref 70–99)
Glucose-Capillary: 142 mg/dL — ABNORMAL HIGH (ref 70–99)
Glucose-Capillary: 72 mg/dL (ref 70–99)
Glucose-Capillary: 75 mg/dL (ref 70–99)
Glucose-Capillary: 89 mg/dL (ref 70–99)

## 2022-04-24 LAB — CBC
HCT: 25.7 % — ABNORMAL LOW (ref 36.0–46.0)
Hemoglobin: 8.3 g/dL — ABNORMAL LOW (ref 12.0–15.0)
MCH: 26.3 pg (ref 26.0–34.0)
MCHC: 32.3 g/dL (ref 30.0–36.0)
MCV: 81.3 fL (ref 80.0–100.0)
Platelets: 249 10*3/uL (ref 150–400)
RBC: 3.16 MIL/uL — ABNORMAL LOW (ref 3.87–5.11)
RDW: 23.7 % — ABNORMAL HIGH (ref 11.5–15.5)
WBC: 5.6 10*3/uL (ref 4.0–10.5)
nRBC: 0 % (ref 0.0–0.2)

## 2022-04-24 LAB — MAGNESIUM: Magnesium: 1.6 mg/dL — ABNORMAL LOW (ref 1.7–2.4)

## 2022-04-24 MED ORDER — KETOROLAC TROMETHAMINE 15 MG/ML IJ SOLN
15.0000 mg | Freq: Once | INTRAMUSCULAR | Status: AC
  Administered 2022-04-24: 15 mg via INTRAVENOUS
  Filled 2022-04-24: qty 1

## 2022-04-24 MED ORDER — METOPROLOL TARTRATE 25 MG PO TABS
12.5000 mg | ORAL_TABLET | Freq: Two times a day (BID) | ORAL | Status: DC
  Administered 2022-04-24 – 2022-04-25 (×2): 12.5 mg via ORAL
  Filled 2022-04-24 (×4): qty 1

## 2022-04-24 MED ORDER — LEVETIRACETAM 750 MG PO TABS
750.0000 mg | ORAL_TABLET | Freq: Two times a day (BID) | ORAL | 3 refills | Status: DC
  Filled 2022-04-24: qty 180, 90d supply, fill #0
  Filled 2022-04-24: qty 10, 5d supply, fill #0
  Filled 2022-04-24: qty 170, 85d supply, fill #0

## 2022-04-24 MED ORDER — ZINC OXIDE 40 % EX OINT
TOPICAL_OINTMENT | CUTANEOUS | Status: DC | PRN
  Filled 2022-04-24: qty 113

## 2022-04-24 MED ORDER — NICOTINE 21 MG/24HR TD PT24
21.0000 mg | MEDICATED_PATCH | Freq: Every day | TRANSDERMAL | Status: DC
  Filled 2022-04-24: qty 1

## 2022-04-24 MED ORDER — PHENYLEPHRINE IN HARD FAT 0.25 % RE SUPP
1.0000 | Freq: Once | RECTAL | Status: AC
  Administered 2022-04-24: 1 via RECTAL
  Filled 2022-04-24: qty 1

## 2022-04-24 MED ORDER — POTASSIUM CHLORIDE CRYS ER 20 MEQ PO TBCR
40.0000 meq | EXTENDED_RELEASE_TABLET | Freq: Once | ORAL | Status: AC
  Administered 2022-04-24: 40 meq via ORAL
  Filled 2022-04-24: qty 2

## 2022-04-24 MED ORDER — HYDROCORTISONE ACETATE 25 MG RE SUPP
25.0000 mg | Freq: Two times a day (BID) | RECTAL | Status: DC
  Administered 2022-04-24 – 2022-04-25 (×3): 25 mg via RECTAL
  Filled 2022-04-24 (×4): qty 1

## 2022-04-24 MED ORDER — MAGNESIUM SULFATE 2 GM/50ML IV SOLN
2.0000 g | Freq: Once | INTRAVENOUS | Status: AC
  Administered 2022-04-24: 2 g via INTRAVENOUS

## 2022-04-24 MED ORDER — MAGNESIUM SULFATE 2 GM/50ML IV SOLN
2.0000 g | Freq: Once | INTRAVENOUS | Status: AC
  Administered 2022-04-24: 2 g via INTRAVENOUS
  Filled 2022-04-24: qty 50

## 2022-04-24 MED ORDER — NICOTINE POLACRILEX 2 MG MT GUM
2.0000 mg | CHEWING_GUM | OROMUCOSAL | Status: DC | PRN
  Filled 2022-04-24: qty 1

## 2022-04-24 MED ORDER — MAGNESIUM OXIDE -MG SUPPLEMENT 400 (240 MG) MG PO TABS
400.0000 mg | ORAL_TABLET | Freq: Two times a day (BID) | ORAL | Status: DC
  Administered 2022-04-25: 400 mg via ORAL
  Filled 2022-04-24: qty 1

## 2022-04-24 MED ORDER — PHENYLEPHRINE-MINERAL OIL-PET 0.25-14-74.9 % RE OINT
1.0000 | TOPICAL_OINTMENT | Freq: Two times a day (BID) | RECTAL | Status: DC | PRN
  Administered 2022-04-24: 1 via RECTAL
  Filled 2022-04-24: qty 57

## 2022-04-24 NOTE — Progress Notes (Signed)
Physical Therapy Treatment Patient Details Name: Stacy Grimes MRN: [id number] DOB: [birth year] Today's Date: 04/24/2022   History of Present Illness Pt is a 72 year old female with history of stroke, dementia, COPD on chronic 4 L nasal cannula,  cardiomyopathy, seizure disorder, PE, and severe PAH s/p AICD who presents to the emergency department for chief concerns of altered mental status. MD assessment includes: altered mental status, UTI, mild hematuria,  hypoglycemia, dehydration, hypotension, lactic acidosis, AKI, normocytic anemia, thrombocytopenia, hyperkalemia, hyponatremia, hypomagnesemia, hypocalcemia, focal seizures with secondary generalization, and physical deconditioning.    PT Comments    Pt is making very limited progress towards goals with ability to roll to B sides, however session interrupted as pt needing to get EKG secondary to abnormal heart rhythm. Pt with incontinent BM in bed, needing to be assisted with including hygiene. Pt very limited by pain, will continue to progress.  Recommendations for follow up therapy are one component of a multi-disciplinary discharge planning process, led by the attending physician.  Recommendations may be updated based on patient status, additional functional criteria and insurance authorization.  Follow Up Recommendations  Skilled nursing-short term rehab (<3 hours/day) Can patient physically be transported by private vehicle: No   Assistance Recommended at Discharge Frequent or constant Supervision/Assistance  Patient can return home with the following Two people to help with walking and/or transfers;Two people to help with bathing/dressing/bathroom;Assistance with cooking/housework;Assistance with feeding;Direct supervision/assist for medications management;Direct supervision/assist for financial management;Assist for transportation;Help with stairs or ramp for entrance   Equipment Recommendations  Hospital bed    Recommendations for  Other Services       Precautions / Restrictions Precautions Precautions: Fall Restrictions Weight Bearing Restrictions: No     Mobility  Bed Mobility Overal bed mobility: Needs Assistance Bed Mobility: Rolling Rolling: Mod assist, +2 for physical assistance         General bed mobility comments: rolling to B sides with safe technique. Cues for reaching for railing. Pt left in sidelying secondary to buttock pain    Transfers                   General transfer comment: unable to perform    Ambulation/Gait                   Stairs             Wheelchair Mobility    Modified Rankin (Stroke Patients Only)       Balance                                            Cognition Arousal/Alertness: Awake/alert Behavior During Therapy: WFL for tasks assessed/performed Overall Cognitive Status: Impaired/Different from baseline                                 General Comments: alert to self and able to follow commands inconsistently        Exercises Other Exercises Other Exercises: pt soiled in bed. Needed +2 for rolling and can inconsistently follow commands. Pain with hygiene. Incontient BM    General Comments        Pertinent Vitals/Pain Pain Assessment Pain Assessment: Faces Faces Pain Scale: Hurts worst Pain Location: sacrum Pain Descriptors / Indicators: Aching, Grimacing Pain Intervention(s): Limited activity within patient's tolerance  Home Living                          Prior Function            PT Goals (current goals can now be found in the care plan section) Acute Rehab PT Goals Patient Stated Goal: unable to state PT Goal Formulation: With patient Time For Goal Achievement: 04/26/22 Potential to Achieve Goals: Fair Progress towards PT goals: Progressing toward goals    Frequency    Min 2X/week      PT Plan Current plan remains appropriate    Co-evaluation               AM-PAC PT "6 Clicks" Mobility   Outcome Measure  Help needed turning from your back to your side while in a flat bed without using bedrails?: Total Help needed moving from lying on your back to sitting on the side of a flat bed without using bedrails?: Total Help needed moving to and from a bed to a chair (including a wheelchair)?: Total Help needed standing up from a chair using your arms (e.g., wheelchair or bedside chair)?: Total Help needed to walk in hospital room?: Total Help needed climbing 3-5 steps with a railing? : Total 6 Click Score: 6    End of Session   Activity Tolerance: Patient limited by pain Patient left: in bed;with call bell/phone within reach;with bed alarm set Nurse Communication: Mobility status PT Visit Diagnosis: Difficulty in walking, not elsewhere classified (R26.2);Muscle weakness (generalized) (M62.81)     Time: [id number] PT Time Calculation (min) (ACUTE ONLY): 12 min  Charges:  $Therapeutic Activity: 8-22 mins                     Greggory Stallion, PT, DPT, GCS [phone]    Stacy Grimes 04/24/2022, 1:32 PM

## 2022-04-24 NOTE — Progress Notes (Signed)
Telemetry called to report that patient had a 13 [place] of V-Tach at 10:17 a.m. Private messaged and paged MD to make him aware. MD ordered labs, beta blocker, potassium, and EKG.

## 2022-04-24 NOTE — Progress Notes (Signed)
Pt's blood sugar 59 at midnight check. Pt given orange juice and recheck was 75. Will continue to monitor

## 2022-04-24 NOTE — TOC Progression Note (Signed)
Transition of Care [facility]) - Progression Note    Patient Details  Name: Stacy Grimes MRN: [id number] Date of Birth: 06/29/1950  Transition of Care [facility]) CM/SW [place], LCSW Phone Number: 04/24/2022, 11:13 AM  Clinical Narrative:  CSW continues to follow progress for return home with home health.   Expected Discharge Plan: [place] Barriers to Discharge: Continued Medical Work up  Expected Discharge Plan and Services                                   HH Arranged: PT, OT, RN [facility] Agency: [facility] Date [place]: 04/12/22 Time [place]: [id number] Representative spoke with at [place]: [place] Interventions SDOH Screenings   Food Insecurity: No Food Insecurity (03/07/2022)  Housing: Low Risk  (03/07/2022)  Transportation Needs: No Transportation Needs (03/07/2022)  Utilities: Not At Risk (03/07/2022)  Depression (PHQ2-9): Low Risk  (09/01/2019)  Tobacco Use: Medium Risk (04/11/2022)    Readmission Risk Interventions    04/12/2022    1:27 PM 03/08/2022    1:45 PM  Readmission Risk Prevention Plan  Transportation Screening Complete Complete  Medication Review Press photographer) Complete Complete  PCP or Specialist appointment within 3-5 days of discharge Complete Complete  HRI or Home Care Consult Complete Complete  SW Recovery Care/Counseling Consult Complete Complete  Palliative Care Screening Not Complete   [place] Not Complete Complete

## 2022-04-24 NOTE — Progress Notes (Signed)
[facility] Progress Note  Patient: Stacy Grimes    [id number]  DOA: 04/11/2022     Date of Service: the patient was seen and examined on 04/24/2022  Chief Complaint  Patient presents with   Altered Mental Status   Brief hospital course: 72 year old F with PMH of dementia, COPD/chronic hypoxic RF on 4 L by [place], CVA, HFrEF with AICD, HTN, anxiety and seizure disorder presenting with altered mental status for 3 days in the setting of possible polypharmacy, dehydration and possible UTI.  Also noted to have hypocalcemia with lactic acidosis, AKI hyponatremia and hyperkalemia.  Digoxin level elevated.  CT head and CXR without acute finding.  Received IV fluid, IV calcium and treatment for hyperkalemia.  Started on IV antibiotics for possible UTI.  Neurology consulted and adjusted medications to minimize polypharmacy    Assessment and Plan: Principal Problem:   Altered mental status  Active Problems:   AKI (acute kidney injury) ([place])   Chronic systolic CHF (congestive heart failure) (HCC)   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   Chronic obstructive pulmonary disease, unspecified COPD type ([place])   Chronic respiratory failure with hypoxia (HCC)   Dementia ([place])   Hypertension   Benign neoplasm of colon   Seizures ([place])   Status epilepticus ([place])   Protein calorie malnutrition ([place])   Hypothyroidism   Depression   ICD (implantable cardioverter-defibrillator) in place   Hyperkalemia   Malnutrition of moderate degree  Altered mental status: Multifactorial including dehydration, AKI/azotemia, possible UTI, polypharmacy, underlying dementia.  UA with large LE but rare bacteria and no nitrite.  Unfortunately, urine culture was not sent.  Blood cultures NGTD.  CT head and CXR without acute finding.  Ammonia normal.  B12 elevated.  TSH low but free T4 normal.  Dementia seems to be severe.  She has significant electrolyte derangement likely from dehydration.   Creatinine level is elevated as well.  Seems to be on gabapentin, Remeron and Atarax at home which increases her risk of polypharmacy.  She has no focal neurologic state to suggest CVA. -Treat treatable causes-possible UTI, dehydration and hypotension -Reorientation and delirium precautions Neurology consulted, discontinued Depakote Started Vimpat, recommended to consider starting clonazepam 1 mg p.o. twice daily if needed Continue Remeron 7.5 nightly and Atarax as needed -Resume gabapentin 100 twice daily at reduced dose   History of seizure disorder:  Patient was on Depakote which has been discontinued.   Depakote level 40, below normal range Neurology consulted, started Vimpat 100 mg po bid, recommended to consider clonazepam 1 mg p.o. twice daily if needed.   Watch for any seizure activity, continue seizure precautions D/w neurology to change medications, plan was to start Briviact but copayment was very high.  Patient's husband agreed to start Keppra at low-dose. 3/15 Started keppra 500mg  bid x3 days f/b 750mg  bid after that - vimpat 50mg  bid x3 days then stop on 3/18 on 3/19 Increase Keppra 750 mg IV bid, changed to oral Keppra 750 twice daily pm dose. Follow neuro for further recommendation  Urinary tract infection, mild hematuria: UA with large LE but rare bacteria and no nitrites.  Unfortunately urine culture was not sent.  Blood cultures NGTD. -s/p ceftriaxone empirically given for 6 days, DC'd on 3/12 due to thrombocytopenia    Hypoglycemia/dehydration/hypotension/lactic acidosis: -s/p LR due to lactic acidosis. S/p 0.45 NS   -Discontinue metoprolol. -cortisol level 16.8 WNL and TTE LVEF 50 to 55%, no any other significant findings -lactic acid 3.5--2.2 trended down -Use midodrine 5 mg  p.o. 3 times daily as needed    AKI: Likely prerenal from dehydration and poor p.o. intake.  Also Lasix and losartan at home.  CT renal stone study without acute finding.  CK within  normal. Continue IV fluid for hydration Creatinine 1.06  stable -Closely monitor renal functions -Dose medications renally -Avoid nephrotoxic meds -Strict intake and output.   Normocytic anemia/thrombocytopenia: Notable drop in Hgb.  No obvious bleeding.  Hemodilution?  Folic acid low. -Hb [id number] 8.5 s/p  transfusing 1 unit.   Hb 9.1--9.6--9.4--8.2--8.6--8.3 -Iron profile and B12 within normal range Folic acid deficiency, folate level 3.0, started oral folate supplement. Platelet count 51--77--109--249 Haptoglobin 10.5 wnl coagulation profile wnl LDH 200, fibrinogen level 456 LFT wnl  HIT antibody negative  -Monitor H&H and transfuse if hemoglobin less than 7 Hematologist consulted, recommended to discontinue ceftriaxone and continue to monitor.     Chronic HFrEF/DCM/severe PAH s/p AICD: Stable.  Dehydrated on presentation.  TTE 10/2021 with LVEF of 40 to 45%, GH, indeterminate DD and severe PASP -Hold diuretics and losartan -Hold metoprolol given hypotension 04/15/2022 TTE shows LVEF 50 to 55%, no any other significant findings -Closely monitor fluid and respiratory status while on IV fluid 3/11 Lopressor 5 mg IV one-time dose given due to persistent tachycardia 3/20 nonscented V. tach episode, discussed with cardiology, recommended replete potassium and magnesium. Started Lopressor 12.5 mg p.o. twice daily  History of CVA 3/17 resumed Plavix, continue Lipitor   Hyperkalemia/hyponatremia: Resolved.   Hypophosphatemia, Phos repleted. Hypokalemia/hypomagnesemia/hypocalcemia -Monitor and replenish as appropriate. -IV fluid as above.   Vitamin D deficiency -Vitamin D 50,000 units weekly    Elevated digoxin levels -Continue holding digoxin Digoxin level 3.2->1.9, trended down    Chronic respiratory failure on 4 L oxygen at baseline  Continue supplemental O2 admission Continue home bronchodilators prn   Dementia with behavioral disturbance:  -Continue donepezil and  memantine -See AMS above   Depression: Stable. -Continue hydroxyzine and mirtazapine.  No longer on Effexor.   Hypothyroidism: TSH low but free T4 normal. -Continue home Synthroid   Physical deconditioning -PT/OT    Moderate malnutrition Body mass index is 23.03 kg/m. Nutrition Problem: Moderate Malnutrition Etiology: chronic illness (dementia) Signs/Symptoms: mild fat depletion, moderate fat depletion, mild muscle depletion, moderate muscle depletion, percent weight loss Interventions: Magic cup, MVI, Prostat   Diet: Dysphagia 2 diet DVT Prophylaxis: Subcutaneous Lovenox   Advance goals of care discussion: Full code  Family Communication: family was present at bedside, at the time of interview.  The pt provided permission to discuss medical plan with the family. Opportunity was given to ask question and all questions were answered satisfactorily.  3/18 Discussed with patient's spouse at bedside.  Disposition:  Pt is from Home, admitted with AMS, still has AMS, nonsustained V. tach episodes, which precludes a safe discharge. Discharge to Home, when stable.  Most likely discharge tomorrow a.m.  Subjective: No significant events overnight, patient was c/o pain in her bottom, RN noticed hemorrhoids and had an episode of nonsustained V. tach on the monitor and patient was complaining of weird feelings.  No chest pain or palpitations Patient is also having jerking to bilateral arms, no any other complaints.   Physical Exam: General: NAD, lying comfortably Appear in no distress, affect appropriate Eyes: PERRLA ENT: Oral Mucosa Clear, moist  Neck: no JVD,  Cardiovascular: S1 and S2 Present, no Murmur,  Respiratory: good respiratory effort, Bilateral Air entry equal and Decreased, no Crackles, no wheezes Abdomen: Bowel Sound present, Soft and  no tenderness,  Skin: no rashes Extremities: no Pedal edema, no calf tenderness Neurologic: without any new focal findings Gait not  checked due to patient safety concerns  Vitals:   04/24/22 0444 04/24/22 0750 04/24/22 1045 04/24/22 1158  BP: (!) 114/40 (!) 118/56 (!) 122/40 109/60  Pulse: 93 96 99 90  Resp: 18 17 18 17   Temp: 98 F (36.7 C) 98.4 F (36.9 C) 98 F (36.7 C) 97.6 F (36.4 C)  TempSrc:    Oral  SpO2: 100% 98% 100% 100%  Weight:      Height:        Intake/Output Summary (Last 24 hours) at 04/24/2022 1313 Last data filed at 04/24/2022 1035 Gross per 24 hour  Intake 1360 ml  Output 604 ml  Net 756 ml   Filed Weights   04/21/22 0449 04/21/22 1000 04/22/22 0431  Weight: 65.8 kg 63.8 kg 62.9 kg    Data Reviewed: I have personally reviewed and interpreted daily labs, tele strips, imagings as discussed above. I reviewed all nursing notes, pharmacy notes, vitals, pertinent old records I have discussed plan of care as described above with RN and patient/family.  CBC: Recent Labs  Lab 04/19/22 0540 04/21/22 0549 04/22/22 0538 04/23/22 0900 04/24/22 0719  WBC 6.6 6.9 4.8 4.4 5.6  HGB 8.6* 8.2* 8.6* 8.2* 8.3*  HCT 27.7* 26.2* 27.1* 26.1* 25.7*  MCV 82.0 80.9 80.7 81.6 81.3  PLT 159 213 186 248 0000000   Basic Metabolic Panel: Recent Labs  Lab 04/18/22 0632 04/19/22 0540 04/21/22 0549 04/22/22 0538 04/23/22 0900 04/24/22 0719  NA 140 145 138 142 145 139  K 3.7 3.7 3.8 4.0 3.7 3.8  CL 112* 109 108 107 108 107  CO2 24 26 25 26 29 28   GLUCOSE 78 87 90 80 79 93  BUN 13 11 10 9 10 12   CREATININE 1.17* 1.14* 0.97 1.06* 1.07* 1.08*  CALCIUM 8.0* 8.2* 7.6* 8.1* 7.9* 7.8*  MG 2.2 2.0 1.7 1.7 1.8  --   PHOS 3.8 3.3 2.9 3.3 3.6  --     Studies: No results found.  Scheduled Meds:  allopurinol  50 mg Oral Daily   atorvastatin  80 mg Oral QHS   clopidogrel  75 mg Oral Daily   donepezil  10 mg Oral QHS   enoxaparin (LOVENOX) injection  40 mg Subcutaneous QPM   famotidine  20 mg Oral Daily   feeding supplement  237 mL Oral BID BM   folic acid  1 mg Oral Daily   gabapentin  100 mg Oral  BID   hydrocortisone  25 mg Rectal BID   levETIRAcetam  750 mg Oral BID   levothyroxine  100 mcg Oral QAC breakfast   memantine  10 mg Oral BID   metoprolol tartrate  12.5 mg Oral BID   mirtazapine  7.5 mg Oral QHS   mometasone-formoterol  2 puff Inhalation BID   multivitamin with minerals  1 tablet Oral Daily   mouth rinse  15 mL Mouth Rinse 4 times per day   Vitamin D (Ergocalciferol)  50,000 Units Oral Q7 days   Continuous Infusions:  magnesium sulfate bolus IVPB       PRN Meds: acetaminophen, hydrOXYzine, ipratropium-albuterol, liver oil-zinc oxide, midodrine, ondansetron (ZOFRAN) IV, mouth rinse, oxyCODONE, phenylephrine-shark liver oil-mineral oil-petrolatum, polyvinyl alcohol, senna-docusate, traZODone  Time spent: 50 minutes  Author: Val Riles. MD [facility] 04/24/2022 1:13 PM  To reach On-call, see care teams to locate the attending and reach  out to them via [email or web address]. If 7PM-7AM, please contact night-coverage If you still have difficulty reaching the attending provider, please page the [facility] (Director on Call) for [facility] on amion for assistance.

## 2022-04-25 ENCOUNTER — Other Ambulatory Visit: Payer: Self-pay

## 2022-04-25 DIAGNOSIS — R410 Disorientation, unspecified: Secondary | ICD-10-CM | POA: Diagnosis not present

## 2022-04-25 LAB — GLUCOSE, CAPILLARY
Glucose-Capillary: 64 mg/dL — ABNORMAL LOW (ref 70–99)
Glucose-Capillary: 77 mg/dL (ref 70–99)
Glucose-Capillary: 82 mg/dL (ref 70–99)
Glucose-Capillary: 83 mg/dL (ref 70–99)
Glucose-Capillary: 91 mg/dL (ref 70–99)

## 2022-04-25 LAB — CBC
HCT: 25 % — ABNORMAL LOW (ref 36.0–46.0)
Hemoglobin: 7.9 g/dL — ABNORMAL LOW (ref 12.0–15.0)
MCH: 26.1 pg (ref 26.0–34.0)
MCHC: 31.6 g/dL (ref 30.0–36.0)
MCV: 82.5 fL (ref 80.0–100.0)
Platelets: 226 10*3/uL (ref 150–400)
RBC: 3.03 MIL/uL — ABNORMAL LOW (ref 3.87–5.11)
RDW: 25 % — ABNORMAL HIGH (ref 11.5–15.5)
WBC: 4.9 10*3/uL (ref 4.0–10.5)
nRBC: 0 % (ref 0.0–0.2)

## 2022-04-25 LAB — BASIC METABOLIC PANEL
Anion gap: 8 (ref 5–15)
BUN: 14 mg/dL (ref 8–23)
CO2: 28 mmol/L (ref 22–32)
Calcium: 7.8 mg/dL — ABNORMAL LOW (ref 8.9–10.3)
Chloride: 106 mmol/L (ref 98–111)
Creatinine, Ser: 1.14 mg/dL — ABNORMAL HIGH (ref 0.44–1.00)
GFR, Estimated: 51 mL/min — ABNORMAL LOW (ref >60)
Glucose, Bld: 85 mg/dL (ref 70–99)
Potassium: 4.1 mmol/L (ref 3.5–5.1)
Sodium: 142 mmol/L (ref 135–145)

## 2022-04-25 LAB — MAGNESIUM: Magnesium: 2.3 mg/dL (ref 1.7–2.4)

## 2022-04-25 LAB — PHOSPHORUS: Phosphorus: 3.3 mg/dL (ref 2.5–4.6)

## 2022-04-25 MED ORDER — BISACODYL 5 MG PO TBEC
10.0000 mg | DELAYED_RELEASE_TABLET | Freq: Every evening | ORAL | 0 refills | Status: DC | PRN
  Filled 2022-04-25: qty 30, 15d supply, fill #0

## 2022-04-25 MED ORDER — METOPROLOL TARTRATE 25 MG PO TABS
12.5000 mg | ORAL_TABLET | Freq: Two times a day (BID) | ORAL | 2 refills | Status: DC
  Filled 2022-04-25: qty 30, 30d supply, fill #0

## 2022-04-25 MED ORDER — HYDROCORTISONE ACETATE 25 MG RE SUPP
25.0000 mg | Freq: Two times a day (BID) | RECTAL | 0 refills | Status: DC | PRN
  Filled 2022-04-25: qty 12, 6d supply, fill #0

## 2022-04-25 MED ORDER — ADULT MULTIVITAMIN W/MINERALS CH
1.0000 | ORAL_TABLET | Freq: Every day | ORAL | 0 refills | Status: AC
  Filled 2022-04-25: qty 30, 30d supply, fill #0

## 2022-04-25 MED ORDER — TRAZODONE HCL 50 MG PO TABS
50.0000 mg | ORAL_TABLET | Freq: Every evening | ORAL | 0 refills | Status: DC | PRN
  Filled 2022-04-25: qty 30, 30d supply, fill #0

## 2022-04-25 MED ORDER — FOLIC ACID 1 MG PO TABS
1.0000 mg | ORAL_TABLET | Freq: Every day | ORAL | 0 refills | Status: AC
  Filled 2022-04-25: qty 90, 90d supply, fill #0

## 2022-04-25 MED ORDER — MIDODRINE HCL 5 MG PO TABS
5.0000 mg | ORAL_TABLET | Freq: Three times a day (TID) | ORAL | 2 refills | Status: DC
  Filled 2022-04-25: qty 90, 30d supply, fill #0

## 2022-04-25 MED ORDER — VITAMIN D (ERGOCALCIFEROL) 1.25 MG (50000 UNIT) PO CAPS
50000.0000 [IU] | ORAL_CAPSULE | ORAL | 0 refills | Status: AC
  Filled 2022-04-25: qty 12, 84d supply, fill #0

## 2022-04-25 MED ORDER — GABAPENTIN 100 MG PO CAPS
300.0000 mg | ORAL_CAPSULE | Freq: Two times a day (BID) | ORAL | 2 refills | Status: AC
  Filled 2022-04-25: qty 180, 30d supply, fill #0

## 2022-04-25 MED ORDER — POLYETHYLENE GLYCOL 3350 17 G PO PACK
17.0000 g | PACK | Freq: Two times a day (BID) | ORAL | 2 refills | Status: DC
  Filled 2022-04-25: qty 60, 30d supply, fill #0

## 2022-04-25 MED ORDER — MIDODRINE HCL 5 MG PO TABS
5.0000 mg | ORAL_TABLET | Freq: Three times a day (TID) | ORAL | Status: DC
  Administered 2022-04-25 (×3): 5 mg via ORAL
  Filled 2022-04-25 (×3): qty 1

## 2022-04-25 NOTE — Discharge Summary (Signed)
[facility] Discharge Summary   Patient: GEVENA CORRALES [id number]  PCP: Juluis Pitch, MD  Date of admission: 04/11/2022   Date of discharge:  04/25/2022     Discharge Diagnoses:  Principal Problem:   Altered mental status Active Problems:   AKI (acute kidney injury) ([place])   Chronic systolic CHF (congestive heart failure) (HCC)   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   Chronic obstructive pulmonary disease, unspecified COPD type ([place])   Chronic respiratory failure with hypoxia ([place])   Dementia with behavioral disturbance ([place])   Hypertension   Benign neoplasm of colon   Seizures ([place])   Status epilepticus ([place])   Protein calorie malnutrition ([place])   Hypothyroidism   Depression   ICD (implantable cardioverter-defibrillator) in place   Hyperkalemia   Malnutrition of moderate degree   Normocytic anemia   Thrombocytopenia (HCC)   Lactic acidosis   Admitted From: Home Disposition:  Home with [place] services  Recommendations for Outpatient Follow-up:  PCP: in 1 wk Neur in 1-2 weeks  Follow up LABS/TEST:     Diet recommendation: Cardiac diet  Activity: The patient is advised to gradually reintroduce usual activities, as tolerated  Discharge Condition: stable  Code Status: Full code   History of present illness: As per the H and P dictated on admission Hospital Course:  72 year old F with PMH of dementia, COPD/chronic hypoxic RF on 4 L by [place], CVA, HFrEF with AICD, HTN, anxiety and seizure disorder presenting with altered mental status for 3 days in the setting of possible polypharmacy, dehydration and possible UTI.  Also noted to have hypocalcemia with lactic acidosis, AKI hyponatremia and hyperkalemia.  Digoxin level elevated.  CT head and CXR without acute finding.  Received IV fluid, IV calcium and treatment for hyperkalemia.  Started on IV antibiotics for possible UTI.  Neurology consulted and adjusted medications to minimize  polypharmacy    Assessment and Plan: # Altered mental status: Multifactorial including dehydration, AKI/azotemia, possible UTI, polypharmacy, underlying dementia.  UA with large LE but rare bacteria and no nitrite.  Unfortunately, urine culture was not sent.  Blood cultures NGTD.  CT head and CXR without acute finding.  Ammonia normal.  B12 elevated.  TSH low but free T4 normal.  Dementia seems to be severe.  She has significant electrolyte derangement likely from dehydration.  Creatinine level was elevated as well.  Seems to be on gabapentin, Remeron and Atarax at home which increases her risk of polypharmacy.  She has no focal neurologic state to suggest CVA. Treat treatable causes-possible UTI, dehydration and hypotension. Reorientation and delirium precautions.  Neurology consult as below. Continue Remeron 7.5 nightly and Atarax as needed, Resume gabapentin 100 twice daily at reduced dose.  Mental status improved, resumed home dose Gabapentin as needed  # History of seizure disorder: Patient was on Depakote which was discontinued.   Depakote level 40, below normal range. Neurology consulted, started Vimpat 100 mg po bid, recommended to consider clonazepam 1 mg p.o. twice daily if needed.   Watch for any seizure activity, continue seizure precautions D/w neurology to change medications, plan was to start Briviact but copayment was very high.  Patient's husband agreed to start Keppra at low-dose. On 3/15 Started keppra 500mg  bid x3 days f/b 750mg  bid after that.  vimpat 50mg  bid x3 days then stop on 3/18. On  3/19 Increase Keppra 750 mg IV bid, changed to oral Keppra 750 twice daily pm dose.  Patient was cleared by neurology to discharge home and follow-up as  an outpatient. # Urinary tract infection, mild hematuria: UA with large LE but rare bacteria and no nitrites.  Unfortunately urine culture was not sent.  Blood cultures NGTD. s/p ceftriaxone empirically given for 6 days, DC'd on 3/12 due to  thrombocytopenia # Hypoglycemia/dehydration/hypotension/lactic acidosis: s/p LR due to lactic acidosis. S/p 0.45 NS, Discontinue metoprolol due to low BP. cortisol level 16.8 WNL and TTE LVEF 50 to 55%, no any other significant findings. lactic acid 3.5--2.2 trended down. Use midodrine 5 mg p.o. 3 times daily as needed # AKI: Likely prerenal from dehydration and poor p.o. intake.  Also Lasix and losartan at home.  CT renal stone study without acute finding.  CK within normal. Creatinine 1.06  stable, s/p IV fluid for hydration # Normocytic anemia/thrombocytopenia: Notable drop in Hgb.  No obvious bleeding.  Hemodilution?  Folic acid low. Hb 6.7--Hb 8.5 s/p  transfusing 1 unit.  Hb 7.9 now stable. Iron profile and B12 within normal range Folic acid deficiency, folate level 3.0, started oral folate supplement. Platelet count 51--77--109-226 reolsed. Haptoglobin 10.5 wnl coagulation profile wnl. LDH 200, fibrinogen level 456. LFT wnl. HIT antibody negative  Hematologist consulted, recommended to discontinue ceftriaxone and continue to monitor.   # Chronic HFrEF/DCM/severe PAH s/p AICD: Stable.  Dehydrated on presentation.  TTE 10/2021 with LVEF of 40 to 45%, Held diuretics and losartan, Hold metoprolol given hypotension. On 04/15/2022 TTE shows LVEF 50 to 55%, no any other significant findings. On 3/11 Lopressor 5 mg IV one-time dose given due to persistent tachycardia 3/20 nonscented V. tach episode, discussed with cardiology, recommended replete potassium and magnesium. Started Lopressor 12.5 mg p.o. twice daily. And started Midodrin 5 mg po TID with holding parameters # History of CVA on 3/17 resumed Plavix, continue Lipitor # Hyperkalemia/hyponatremia: Resolved. # Hypophosphatemia, Phos repleted. Resolved  # Hypokalemia/hypomagnesemia/hypocalcemia, Resolved  # Vitamin D deficiency, Vitamin D 50,000 units weekly # Elevated digoxin levels, Held digoxin, Digoxin level 3.2->1.9, trended down. Resumed  Digoxin on discharge, F/u with Cardiology  # Chronic respiratory failure on 4 L oxygen at baseline, Continue supplemental O2 admission Continue home bronchodilators prn # Dementia with behavioral disturbance: -Continue donepezil and memantine # Depression: Stable. Continue hydroxyzine and mirtazapine.  No longer on Effexor. # Hypothyroidism: TSH low but free T4 normal. Continue home Synthroid # Moderate malnutrition: Body mass index is 23.03 kg/m. Nutrition Problem: Moderate Malnutrition Etiology: chronic illness (dementia) Signs/Symptoms: mild fat depletion, moderate fat depletion, mild muscle depletion, moderate muscle depletion, percent weight loss Interventions: Magic cup, MVI, Prostat  Patient was seen by physical therapy, who recommended Home health, which was arranged. On the day of the discharge the patient's vitals were stable, and no other acute medical condition were reported by patient. the patient was felt safe to be discharge at Home with Home health.  Consultants: Neurology, and hematology Procedures: None  Discharge Exam: General: Appear in no distress, no Rash; Oral Mucosa Clear, moist. Cardiovascular: S1 and S2 Present, no Murmur, Respiratory: normal respiratory effort, Bilateral Air entry present and no Crackles, no wheezes Abdomen: Bowel Sound present, Soft and no tenderness, no hernia Extremities: no Pedal edema, no calf tenderness Neurology: alert and oriented to time, place, and person affect appropriate.  Filed Weights   04/21/22 0449 04/21/22 1000 04/22/22 0431  Weight: 65.8 kg 63.8 kg 62.9 kg   Vitals:   04/25/22 0700 04/25/22 1122  BP: (!) 88/50 (!) 115/95  Pulse: 60 65  Resp: 17 18  Temp: 97.6 F (36.4 C)  SpO2: 91% 100%    DISCHARGE MEDICATION: Allergies as of 04/25/2022       Reactions   Codeine Palpitations   Contrast Media [iodinated Contrast Media] Rash        Medication List     STOP taking these medications    cyanocobalamin  1000 MCG tablet Commonly known as: VITAMIN B12   digoxin 0.125 MG tablet Commonly known as: LANOXIN   divalproex 125 MG capsule Commonly known as: DEPAKOTE SPRINKLE   furosemide 20 MG tablet Commonly known as: LASIX   losartan 25 MG tablet Commonly known as: COZAAR   metoprolol succinate 25 MG 24 hr tablet Commonly known as: TOPROL-XL   potassium chloride SA 20 MEQ tablet Commonly known as: KLOR-CON M   venlafaxine XR 75 MG 24 hr capsule Commonly known as: EFFEXOR-XR       TAKE these medications    acetaminophen 650 MG CR tablet Commonly known as: TYLENOL Take 650 mg by mouth at bedtime.   albuterol 0.63 MG/3ML nebulizer solution Commonly known as: ACCUNEB Take 1 ampule by nebulization every 6 (six) hours as needed for wheezing.   albuterol 108 (90 Base) MCG/ACT inhaler Commonly known as: VENTOLIN HFA Inhale 2 puffs into the lungs every 6 (six) hours as needed.   allopurinol 100 MG tablet Commonly known as: ZYLOPRIM Take 0.5 tablets (50 mg total) by mouth daily. What changed: how much to take   atorvastatin 80 MG tablet Commonly known as: Lipitor Take 1 tablet (80 mg total) by mouth daily.   bisacodyl 5 MG EC tablet Commonly known as: bisacodyl Take 2 tablets (10 mg total) by mouth at bedtime as needed for moderate constipation.   budesonide-formoterol 160-4.5 MCG/ACT inhaler Commonly known as: SYMBICORT Inhale 2 puffs into the lungs 2 (two) times daily.   clopidogrel 75 MG tablet Commonly known as: PLAVIX Take 1 tablet (75 mg total) by mouth daily.   donepezil 10 MG tablet Commonly known as: ARICEPT Take 10 mg by mouth at bedtime.   famotidine 20 MG tablet Commonly known as: PEPCID Take 1 tablet (20 mg total) by mouth daily.   folic acid 1 MG tablet Commonly known as: FOLVITE Take 1 tablet (1 mg total) by mouth daily. Start taking on: April 26, 2022   gabapentin 100 MG capsule Commonly known as: NEURONTIN Take 3 capsules (300 mg total) by  mouth 2 (two) times daily. What changed:  medication strength when to take this reasons to take this   hydrocortisone 25 MG suppository Commonly known as: ANUSOL-HC Place 1 suppository (25 mg total) rectally 2 (two) times daily as needed for hemorrhoids or anal itching.   hydrOXYzine 25 MG capsule Commonly known as: VISTARIL Take 25 mg by mouth at bedtime as needed.   levETIRAcetam 750 MG tablet Commonly known as: KEPPRA Take 1 tablet (750 mg total) by mouth 2 (two) times daily.   levothyroxine 100 MCG tablet Commonly known as: SYNTHROID Take 100 mcg by mouth daily.   memantine 10 MG tablet Commonly known as: NAMENDA Take 10 mg by mouth in the morning and at bedtime.   metoprolol tartrate 25 MG tablet Commonly known as: LOPRESSOR Take 0.5 tablets (12.5 mg total) by mouth 2 (two) times daily. Hold if SBP <110 mmHg and or HR <65   midodrine 5 MG tablet Commonly known as: PROAMATINE Take 1 tablet (5 mg total) by mouth 3 (three) times daily with meals. Hold if systolic BP [id number]   mirtazapine 7.5 MG tablet Commonly known  as: REMERON Take 7.5 mg by mouth 2 (two) times daily.   multivitamin with minerals Tabs tablet Take 1 tablet by mouth daily. Start taking on: April 26, 2022   ondansetron 4 MG tablet Commonly known as: Zofran Take 1 tablet (4 mg total) by mouth every 8 (eight) hours as needed for nausea or vomiting.   polyethylene glycol 17 g packet Commonly known as: MiraLax Take 17 g by mouth 2 (two) times daily.   polyvinyl alcohol 1.4 % ophthalmic solution Commonly known as: LIQUIFILM TEARS Place 1 drop into both eyes as needed for dry eyes.   tiotropium 18 MCG inhalation capsule Commonly known as: SPIRIVA Place 1 capsule (18 mcg total) into inhaler and inhale daily as needed (shortness of breath).   traZODone 50 MG tablet Commonly known as: DESYREL Take 1 tablet (50 mg total) by mouth at bedtime as needed for sleep.   Vitamin D (Ergocalciferol) 1.25 MG  (50000 UNIT) Caps capsule Commonly known as: DRISDOL Take 1 capsule (50,000 Units total) by mouth every 7 (seven) days. Start taking on: April 27, 2022               Durable Medical Equipment  (From admission, onward)           Start     Ordered   04/17/22 1313  For home use only DME Hospital bed  Once       Question Answer Comment  Length of Need Lifetime   Bed type Semi-electric      04/17/22 1334           Allergies  Allergen Reactions   Codeine Palpitations   Contrast Media [Iodinated Contrast Media] Rash   Discharge Instructions     Call MD for:   Complete by: As directed    Recurrent seizures or tremors   Call MD for:  difficulty breathing, headache or visual disturbances   Complete by: As directed    Call MD for:  extreme fatigue   Complete by: As directed    Call MD for:  persistant dizziness or light-headedness   Complete by: As directed    Call MD for:  severe uncontrolled pain   Complete by: As directed    Call MD for:  temperature >100.4   Complete by: As directed    Diet - low sodium heart healthy   Complete by: As directed    Discharge instructions   Complete by: As directed    Follow-up with PCP in 1 week and continue to monitor BP Follow-up with neurology in 1 to 2 weeks Follow-up cardiology in 1 to 2 weeks.   Increase activity slowly   Complete by: As directed    No wound care   Complete by: As directed        The results of significant diagnostics from this hospitalization (including imaging, microbiology, ancillary and laboratory) are listed below for reference.    Significant Diagnostic Studies: DG Abd 1 View  Result Date: 04/20/2022 CLINICAL DATA:  Abdominal distention EXAM: ABDOMEN - 1 VIEW COMPARISON:  None Available. FINDINGS: Nonobstructive pattern of bowel gas. No large burden of stool. No free air in the abdomen. Status post intramedullary nail fixation of the right hip. IMPRESSION: Nonobstructive pattern of bowel  gas. No large burden of stool. No free air in the abdomen. Electronically Signed   By: Delanna Ahmadi M.D.   On: 04/20/2022 12:56   ECHOCARDIOGRAM COMPLETE  Result Date: 04/15/2022    ECHOCARDIOGRAM REPORT  Patient Name:   CORIN DEWITTE [facility] Date of Exam: 04/15/2022 Medical Rec #:  [id number]        Height:       63.0 in Accession #:    [id number]       Weight:       130.0 lb Date of Birth:  09/17/1950         BSA:          1.610 m Patient Age:    10 years         BP:           120/47 mmHg Patient Gender: F                HR:           110 bpm. Exam Location:  [facility] Procedure: 2D Echo, Cardiac Doppler and Color Doppler Indications:     Hypotension  History:         Patient has prior history of Echocardiogram examinations, most                  recent 10/23/2021. CHF and Cardiomyopathy, Defibrillator,                  Stroke, Arrythmias:Tachycardia, Signs/Symptoms:Altered Mental                  Status; Risk Factors:Hypertension and Diabetes. Patient with                  Dementia is unable to follow instructions.  Sonographer:     Wenda Low Referring Phys:  [id number] Charlesetta Ivory GONFA Diagnosing Phys: Kathlyn Sacramento MD  Sonographer Comments: Technically difficult study due to poor echo windows and suboptimal apical window. Image acquisition challenging due to uncooperative patient. IMPRESSIONS  1. Left ventricular ejection fraction, by estimation, is 50 to 55%. The left ventricle has low normal function. Left ventricular endocardial border not optimally defined to evaluate regional wall motion. Left ventricular diastolic parameters are indeterminate.  2. Right ventricular systolic function is normal. The right ventricular size is normal. Tricuspid regurgitation signal is inadequate for assessing PA pressure.  3. The mitral valve is normal in structure. No evidence of mitral valve regurgitation. No evidence of mitral stenosis.  4. The aortic valve is normal in structure. Aortic valve regurgitation is not visualized.  Aortic valve sclerosis/calcification is present, without any evidence of aortic stenosis.  5. The inferior vena cava is normal in size with <50% respiratory variability, suggesting right atrial pressure of 8 mmHg. FINDINGS  Left Ventricle: Left ventricular ejection fraction, by estimation, is 50 to 55%. The left ventricle has low normal function. Left ventricular endocardial border not optimally defined to evaluate regional wall motion. The left ventricular internal cavity  size was normal in size. There is no left ventricular hypertrophy. Left ventricular diastolic parameters are indeterminate. Right Ventricle: The right ventricular size is normal. No increase in right ventricular wall thickness. Right ventricular systolic function is normal. Tricuspid regurgitation signal is inadequate for assessing PA pressure. The tricuspid regurgitant velocity is 2.78 m/s, and with an assumed right atrial pressure of 8 mmHg, the estimated right ventricular systolic pressure is [id number]. Left Atrium: Left atrial size was normal in size. Right Atrium: Right atrial size was normal in size. Pericardium: There is no evidence of pericardial effusion. Mitral Valve: The mitral valve is normal in structure. No evidence of mitral valve regurgitation. No evidence of mitral valve stenosis. MV peak gradient, 7.4 mmHg. The  mean mitral valve gradient is 2.0 mmHg. Tricuspid Valve: The tricuspid valve is normal in structure. Tricuspid valve regurgitation is trivial. No evidence of tricuspid stenosis. Aortic Valve: The aortic valve is normal in structure. Aortic valve regurgitation is not visualized. Aortic valve sclerosis/calcification is present, without any evidence of aortic stenosis. Aortic valve mean gradient measures 2.0 mmHg. Aortic valve peak  gradient measures 5.1 mmHg. Aortic valve area, by VTI measures 2.12 cm. Pulmonic Valve: The pulmonic valve was normal in structure. Pulmonic valve regurgitation is not visualized. No evidence of  pulmonic stenosis. Aorta: The aortic root is normal in size and structure. Venous: The inferior vena cava is normal in size with less than 50% respiratory variability, suggesting right atrial pressure of 8 mmHg. IAS/Shunts: No atrial level shunt detected by color flow Doppler. Additional Comments: A device lead is visualized.  LEFT VENTRICLE PLAX 2D LVIDd:         3.70 cm   Diastology LVIDs:         2.90 cm   LV e' medial:   12.90 cm/s LV PW:         1.00 cm   LV E/e' medial: 9.6 LV IVS:        0.90 cm LVOT diam:     1.90 cm LV SV:         29 LV SV Index:   18 LVOT Area:     2.84 cm  LEFT ATRIUM         Index LA diam:    2.30 cm 1.43 cm/m  AORTIC VALVE                    PULMONIC VALVE AV Area (Vmax):    2.76 cm     PV Vmax:       1.05 m/s AV Area (Vmean):   2.53 cm     PV Peak grad:  4.4 mmHg AV Area (VTI):     2.12 cm AV Vmax:           113.00 cm/s AV Vmean:          61.400 cm/s AV VTI:            0.135 m AV Peak Grad:      5.1 mmHg AV Mean Grad:      2.0 mmHg LVOT Vmax:         110.00 cm/s LVOT Vmean:        54.700 cm/s LVOT VTI:          0.101 m LVOT/AV VTI ratio: 0.75  AORTA Ao Root diam: 2.90 cm MITRAL VALVE                TRICUSPID VALVE MV Area (PHT): 5.27 cm     TR Peak grad:   30.9 mmHg MV Area VTI:   1.79 cm     TR Vmax:        278.00 cm/s MV Peak grad:  7.4 mmHg MV Mean grad:  2.0 mmHg     SHUNTS MV Vmax:       1.36 m/s     Systemic VTI:  0.10 m MV Vmean:      64.4 cm/s    Systemic Diam: 1.90 cm MV Decel Time: 144 msec MV E velocity: 124.00 cm/s Kathlyn Sacramento MD Electronically signed by Kathlyn Sacramento MD Signature Date/Time: 04/15/2022/2:28:38 PM    Final    CT Renal Stone Study  Result Date: 04/11/2022 CLINICAL DATA:  Flank pain.  EXAM: CT ABDOMEN AND PELVIS WITHOUT CONTRAST TECHNIQUE: Multidetector CT imaging of the abdomen and pelvis was performed following the standard protocol without IV contrast. RADIATION DOSE REDUCTION: This exam was performed according to the departmental  dose-optimization program which includes automated exposure control, adjustment of the mA and/or kV according to patient size and/or use of iterative reconstruction technique. COMPARISON:  December 15, 2019. FINDINGS: Lower chest: No acute abnormality. Hepatobiliary: No focal liver abnormality is seen. Status post cholecystectomy. No biliary dilatation. Pancreas: Unremarkable. No pancreatic ductal dilatation or surrounding inflammatory changes. Spleen: Normal in size without focal abnormality. Adrenals/Urinary Tract: Adrenal glands appear normal. Small exophytic cyst is seen involving left kidney for which no further follow-up is required. No hydronephrosis or renal obstruction is noted. No renal or ureteral calculi are noted. Urinary bladder is decompressed. Stomach/Bowel: The stomach is unremarkable. There is no evidence of bowel obstruction or inflammation. Postsurgical changes are seen involving sigmoid colon. Vascular/Lymphatic: Aortic atherosclerosis. No enlarged abdominal or pelvic lymph nodes. Reproductive: Status post hysterectomy. No adnexal masses. Other: No abdominal wall hernia or abnormality. No abdominopelvic ascites. Musculoskeletal: Old L1 compression fracture is noted. No acute osseous abnormality is noted. IMPRESSION: No acute abnormality seen in the abdomen or pelvis. Aortic Atherosclerosis (ICD10-I70.0). Electronically Signed   By: Marijo Conception M.D.   On: 04/11/2022 14:27   CT HEAD WO CONTRAST (5MM)  Result Date: 04/11/2022 CLINICAL DATA:  Altered mental status EXAM: CT HEAD WITHOUT CONTRAST TECHNIQUE: Contiguous axial images were obtained from the base of the skull through the vertex without intravenous contrast. RADIATION DOSE REDUCTION: This exam was performed according to the departmental dose-optimization program which includes automated exposure control, adjustment of the mA and/or kV according to patient size and/or use of iterative reconstruction technique. COMPARISON:  CT head  03/06/2018 FINDINGS: Brain: There is no acute intracranial hemorrhage, extra-axial fluid collection, or acute infarct Background parenchymal volume loss with prominence of the ventricular system and extra-axial CSF spaces is unchanged. The ventricles are stable in size. Multifocal cortical encephalomalacia in both cerebral hemispheres, most notably in the left parietal and occipital lobes, is unchanged. Additional patchy hypodensity in the remainder of the white matter likely reflecting sequela of underlying chronic small-vessel ischemic change is also stable. The pituitary and suprasellar region are normal. There is no mass lesion there is no mass effect or midline shift. Vascular: There is calcification of the bilateral carotid siphons. Skull: Normal. Negative for fracture or focal lesion. Sinuses/Orbits: The imaged paranasal sinuses are clear. Bilateral lens implants are in place. The globes and orbits are otherwise unremarkable. Other: None. IMPRESSION: Stable noncontrast head CT with no acute intracranial pathology. Electronically Signed   By: Valetta Mole M.D.   On: 04/11/2022 14:04   DG Chest Port 1 View  Result Date: 04/11/2022 CLINICAL DATA:  Altered mental status. EXAM: PORTABLE CHEST 1 VIEW COMPARISON:  Chest radiograph 03/06/2022 FINDINGS: The left chest wall cardiac device and associated leads are stable. The cardiomediastinal silhouette is stable The lungs are clear, with no focal consolidation or pulmonary edema. There is no pleural effusion or pneumothorax There is no acute osseous abnormality. IMPRESSION: Stable chest with no radiographic evidence of acute cardiopulmonary process. Electronically Signed   By: Valetta Mole M.D.   On: 04/11/2022 13:55   CUP PACEART REMOTE DEVICE CHECK  Result Date: 04/04/2022 Scheduled remote reviewed. Normal device function.  Multiple AMS, 4-6sec in duraiton Next remote 91 days. [place]   Microbiology: No results found for this or any previous  visit (from the  past 240 hour(s)).   Labs: CBC: Recent Labs  Lab 04/21/22 0549 04/22/22 0538 04/23/22 0900 04/24/22 0719 04/25/22 0553  WBC 6.9 4.8 4.4 5.6 4.9  HGB 8.2* 8.6* 8.2* 8.3* 7.9*  HCT 26.2* 27.1* 26.1* 25.7* 25.0*  MCV 80.9 80.7 81.6 81.3 82.5  PLT 213 186 248 249 [id number]   Basic Metabolic Panel: Recent Labs  Lab 04/19/22 0540 04/21/22 0549 04/22/22 0538 04/23/22 0900 04/24/22 0719 04/25/22 0553  NA 145 138 142 145 139 142  K 3.7 3.8 4.0 3.7 3.8 4.1  CL 109 108 107 108 107 106  CO2 26 25 26 29 28 28   GLUCOSE 87 90 80 79 93 85  BUN 11 10 9 10 12 14   CREATININE 1.14* 0.97 1.06* 1.07* 1.08* 1.14*  CALCIUM 8.2* 7.6* 8.1* 7.9* 7.8* 7.8*  MG 2.0 1.7 1.7 1.8 1.6* 2.3  PHOS 3.3 2.9 3.3 3.6  --  3.3   Liver Function Tests: No results for input(s): "AST", "ALT", "ALKPHOS", "BILITOT", "PROT", "ALBUMIN" in the last 168 hours. No results for input(s): "LIPASE", "AMYLASE" in the last 168 hours. No results for input(s): "AMMONIA" in the last 168 hours. Cardiac Enzymes: No results for input(s): "CKTOTAL", "CKMB", "CKMBINDEX", "TROPONINI" in the last 168 hours. BNP (last 3 results) Recent Labs    10/23/21 1939 12/10/21 1322  BNP 316.7* 86.6   CBG: Recent Labs  Lab 04/24/22 2020 04/25/22 0006 04/25/22 0432 04/25/22 0847 04/25/22 1124  GLUCAP 115* 83 77 64* 91    Time spent: 35 minutes  Signed:  Val Riles  [facility]  04/25/2022 1:41 PM

## 2022-04-25 NOTE — TOC Transition Note (Signed)
Transition of Care [facility]) - CM/SW Discharge Note   Patient Details  Name: Stacy Grimes MRN: [id number] Date of Birth: May 14, 1950  Transition of [facility]) CM/SW Contact:  Gerilyn Pilgrim, LCSW Phone Number: 04/25/2022, 1:41 PM   Clinical Narrative:   CSW spoke with pt's husband regarding SNF, husband states " the next person who asks me about a skilled nursing facility, I am going to stomp their face in". Per MD, pt to be discharged. CSW will arrange EMS, husband notified EMS to be arranged for today. Merleen Nicely with [facility] contacted.       Barriers to Discharge: Continued Medical Work up   Patient Goals and CMS Choice      Discharge Placement                         Discharge Plan and Services Additional resources added to the After Visit Summary for                            [place] Arranged: PT, OT, RN [facility] Agency: [facility] Date [place]: 04/12/22 Time [place]: [id number] Representative spoke with at [place]: [place] ([place]) Interventions SDOH Screenings   Food Insecurity: No Food Insecurity (03/07/2022)  Housing: Low Risk  (03/07/2022)  Transportation Needs: No Transportation Needs (03/07/2022)  Utilities: Not At Risk (03/07/2022)  Depression (PHQ2-9): Low Risk  (09/01/2019)  Tobacco Use: Medium Risk (04/11/2022)     Readmission Risk Interventions    04/12/2022    1:27 PM 03/08/2022    1:45 PM  Readmission Risk Prevention Plan  Transportation Screening Complete Complete  Medication Review Press photographer) Complete Complete  PCP or Specialist appointment within 3-5 days of discharge Complete Complete  HRI or Home Care Consult Complete Complete  SW Recovery Care/Counseling Consult Complete Complete  Palliative Care Screening Not Complete   [place] Not Complete Complete

## 2022-04-25 NOTE — Progress Notes (Signed)
Pt with CBG of 64 this a.m. Juice provided. Breakfast provided. Recheck of 91.

## 2022-04-25 NOTE — Progress Notes (Signed)
Medications changed for d/c. AVS instructions updated and copy with new instructions printed and placed in packet  for EMS to deliver to caregiver on arrival home.

## 2022-04-26 ENCOUNTER — Other Ambulatory Visit: Payer: Self-pay

## 2022-04-29 ENCOUNTER — Ambulatory Visit: Payer: Medicare HMO | Attending: Internal Medicine

## 2022-04-29 DIAGNOSIS — I5022 Chronic systolic (congestive) heart failure: Secondary | ICD-10-CM

## 2022-04-29 DIAGNOSIS — Z95810 Presence of automatic (implantable) cardiac defibrillator: Secondary | ICD-10-CM

## 2022-04-30 ENCOUNTER — Telehealth: Payer: Self-pay

## 2022-04-30 DIAGNOSIS — I110 Hypertensive heart disease with heart failure: Secondary | ICD-10-CM | POA: Diagnosis not present

## 2022-04-30 DIAGNOSIS — G9341 Metabolic encephalopathy: Secondary | ICD-10-CM | POA: Diagnosis not present

## 2022-04-30 DIAGNOSIS — F0283 Dementia in other diseases classified elsewhere, unspecified severity, with mood disturbance: Secondary | ICD-10-CM | POA: Diagnosis not present

## 2022-04-30 DIAGNOSIS — G40901 Epilepsy, unspecified, not intractable, with status epilepticus: Secondary | ICD-10-CM | POA: Diagnosis not present

## 2022-04-30 DIAGNOSIS — F0284 Dementia in other diseases classified elsewhere, unspecified severity, with anxiety: Secondary | ICD-10-CM | POA: Diagnosis not present

## 2022-04-30 DIAGNOSIS — J449 Chronic obstructive pulmonary disease, unspecified: Secondary | ICD-10-CM | POA: Diagnosis not present

## 2022-04-30 DIAGNOSIS — G309 Alzheimer's disease, unspecified: Secondary | ICD-10-CM | POA: Diagnosis not present

## 2022-04-30 DIAGNOSIS — J9611 Chronic respiratory failure with hypoxia: Secondary | ICD-10-CM | POA: Diagnosis not present

## 2022-04-30 DIAGNOSIS — I5023 Acute on chronic systolic (congestive) heart failure: Secondary | ICD-10-CM | POA: Diagnosis not present

## 2022-04-30 NOTE — Progress Notes (Signed)
Spoke with husband, [name], and heart failure questions reviewed.  Transmission results reviewed.  Pt asymptomatic for fluid accumulation.  He reports she is recovering from the recent surgery but does not seem to be having any fluid symptoms.  He did ask about if she should continue on digoxin and advised to call Dr Tyrell Antonio office today to ask about it and he agreed to do do.

## 2022-04-30 NOTE — Telephone Encounter (Signed)
Remote ICM transmission received.  Attempted call to husband per DPR regarding ICM remote transmission and left detailed message per DPR.  Advised to return call for any fluid symptoms or questions. Next ICM remote transmission scheduled 05/06/2022.

## 2022-04-30 NOTE — Progress Notes (Signed)
EPIC Encounter for ICM Monitoring  Patient Name: Stacy Grimes is a 72 y.o. female Date: 04/30/2022 Primary Care Physican: Juluis Pitch, MD Primary Cardiologist: Fletcher Anon Electrophysiologist: Vergie Living Pacing:  98%  10/12/2020 Office Weight: 184 lbs 03/05/2022 Weight:  Unable to weigh at home   AT/AF Burden <1% (taking Aspirin & Plavix)   Attempted call to husband, [name] per DPR and unable to reach.  Left detailed message per DPR regarding transmission. Transmission reviewed.  Hospitalized 3/7-3/21   Coruve thoracic impedance suggesting possible fluid accumulation starting 3/1 which correlates with hydration given during hospitalization.  Impedance returning back close to normal   Prescribed:  Furosemide 20 mg stopped at hospital discharge on 3/21    Labs: 04/25/2022 Creatinine 1.14, BUN 14, Potassium 4.1, Sodium 142, GFR 51  04/24/2022 Creatinine 1.08, BUN 12, Potassium 3.8, Sodium 139, GFR 55  04/23/2022 Creatinine 1.07, BUN 10, Potassium 3.7, Sodium 145, GFR 55  04/22/2022 Creatinine 1.06, BUN   9, Potassium 4.0, Sodium 142, GFR 56 04/21/2022 Creatinine 0.97, BUN 10, Potassium 3.8, Sodium 138, GFR >60  04/19/2022 Creatinine 1.14, BUN 11, Potassium 3.7, Sodium 145, GFR 51  04/18/2022 Creatinine 1.17, BUN 13, Potassium 3.7, Sodium 140, GFR 50  04/17/2022 Creatinine 1.06, BUN 15, Potassium 3.3, Sodium 141, GFR 56  04/16/2022 Creatinine 1.05, BUN 18, Potassium 3.4, Sodium 142, GFR 56  04/15/2022 Creatinine 1.17, BUN 24, Potassium 4.2, Sodium 141, GFR 50  04/14/2022 Creatinine 1.44, BUN 28, Potassium 4.3, Sodium 144, GFR 39  04/13/2022 Creatinine 2.28, BUN 44, Potassium 3.4, Sodium 140, GFR 22 A complete set of results can be found in Results Review.   Recommendations:  Left voice mail with ICM number and encouraged to call if experiencing any fluid symptoms.   Follow-up plan: ICM clinic phone appointment on 05/06/2022 to recheck fluid levels.  91 day device clinic remote  transmission scheduled 07/04/2022.     EP/Cardiology Office Visits:   Canceled 03/08/2022 with Dr Fletcher Anon.     Last OV with Dr Caryl Comes was 07/16/2017.    Copy of ICM check sent to Dr. Caryl Comes.  Will send copy to Dr Fletcher Anon for review and recommendations if husband is reached.        3 month ICM trend: 04/29/2022.    12-14 Month ICM trend:     [name], RN 04/30/2022 11:43 AM

## 2022-05-02 NOTE — Progress Notes (Signed)
Please call her husband and asked him to stop digoxin.  She was hospitalized recently with renal failure and elevated digoxin level and I am concerned about continuing this medication. She should come for a follow-up visit in our office in the next few weeks with an APP.

## 2022-05-03 ENCOUNTER — Telehealth: Payer: Self-pay | Admitting: *Deleted

## 2022-05-03 NOTE — Telephone Encounter (Signed)
The husband has been made aware. He will call back soon to make the appointment.

## 2022-05-03 NOTE — Progress Notes (Signed)
Remote ICD transmission.   

## 2022-05-03 NOTE — Telephone Encounter (Signed)
The husband has been made aware. As of right now, the patient is not able to get in a car to come to an appointment. The husband will call back in a few weeks with an update or he was wondering if they can do a video visit.    Per Dr. Fletcher Anon:  Please call her husband and asked him to stop digoxin.  She was hospitalized recently with renal failure and elevated digoxin level and I am concerned about continuing this medication. She should come for a follow-up visit in our office in the next few weeks with an APP.

## 2022-05-03 NOTE — Telephone Encounter (Signed)
It is okay to wait 1 or 2 months.

## 2022-05-06 ENCOUNTER — Ambulatory Visit: Payer: Medicare HMO | Attending: Internal Medicine

## 2022-05-06 DIAGNOSIS — I5022 Chronic systolic (congestive) heart failure: Secondary | ICD-10-CM

## 2022-05-06 DIAGNOSIS — Z95810 Presence of automatic (implantable) cardiac defibrillator: Secondary | ICD-10-CM

## 2022-05-07 ENCOUNTER — Telehealth: Payer: Self-pay

## 2022-05-07 NOTE — Telephone Encounter (Signed)
Remote ICM transmission received.  Attempted call to husband regarding ICM remote transmission and no answer.   

## 2022-05-07 NOTE — Progress Notes (Signed)
EPIC Encounter for ICM Monitoring  Patient Name: Stacy Grimes is a 72 y.o. female Date: 05/07/2022 Primary Care Physican: Stacy Pitch, MD Primary Cardiologist: Stacy Grimes Electrophysiologist: Stacy Grimes Pacing:  98%  10/12/2020 Office Weight: 184 lbs 03/05/2022 Weight:  Unable to weigh at home   AT/AF Burden <1% (taking Aspirin & Plavix)   Attempted call to husband, Stacy Grimes per DPR and unable to reach.  Left detailed message per DPR regarding transmission. Transmission reviewed.     Coruve thoracic impedance suggesting fluid levels returned to normal.    Prescribed:  Furosemide 20 mg stopped at hospital discharge on 3/21    Labs: 04/25/2022 Creatinine 1.14, BUN 14, Potassium 4.1, Sodium 142, GFR 51  04/24/2022 Creatinine 1.08, BUN 12, Potassium 3.8, Sodium 139, GFR 55  04/23/2022 Creatinine 1.07, BUN 10, Potassium 3.7, Sodium 145, GFR 55  04/22/2022 Creatinine 1.06, BUN   9, Potassium 4.0, Sodium 142, GFR 56 04/21/2022 Creatinine 0.97, BUN 10, Potassium 3.8, Sodium 138, GFR >60  04/19/2022 Creatinine 1.14, BUN 11, Potassium 3.7, Sodium 145, GFR 51  04/18/2022 Creatinine 1.17, BUN 13, Potassium 3.7, Sodium 140, GFR 50  04/17/2022 Creatinine 1.06, BUN 15, Potassium 3.3, Sodium 141, GFR 56  04/16/2022 Creatinine 1.05, BUN 18, Potassium 3.4, Sodium 142, GFR 56  04/15/2022 Creatinine 1.17, BUN 24, Potassium 4.2, Sodium 141, GFR 50  04/14/2022 Creatinine 1.44, BUN 28, Potassium 4.3, Sodium 144, GFR 39  04/13/2022 Creatinine 2.28, BUN 44, Potassium 3.4, Sodium 140, GFR 22 A complete set of results can be found in Results Review.   Recommendations:  Left voice mail with ICM number and encouraged to call if experiencing any fluid symptoms.    Follow-up plan: ICM clinic phone appointment on 05/20/2022.  91 day device clinic remote transmission scheduled 07/04/2022.     EP/Cardiology Office Visits:   Canceled 03/08/2022 with Dr Stacy Grimes.     Last OV with Dr Stacy Grimes was 07/16/2017.    Copy of ICM  check sent to Dr. Caryl Grimes.       3 month ICM trend: 05/06/2022.    12-14 Month ICM trend:     Stacy Billings, RN 05/07/2022 1:54 PM

## 2022-05-07 NOTE — Progress Notes (Signed)
Spoke with husband and heart failure questions reviewed.  Transmission results reviewed.  Pt asymptomatic for fluid accumulation.  Pt is doing okay even though dementia continues to get worse.  No changes and encouraged to call if experiencing any fluid symptoms.

## 2022-05-20 ENCOUNTER — Ambulatory Visit: Payer: Medicare HMO | Attending: Internal Medicine

## 2022-05-20 DIAGNOSIS — I5022 Chronic systolic (congestive) heart failure: Secondary | ICD-10-CM

## 2022-05-20 DIAGNOSIS — Z95810 Presence of automatic (implantable) cardiac defibrillator: Secondary | ICD-10-CM

## 2022-05-21 NOTE — Progress Notes (Signed)
EPIC Encounter for ICM Monitoring  Patient Name: Stacy Grimes is a 72 y.o. female Date: 05/21/2022 Primary Care Physican: [name], MD Primary Cardiologist: Kirke Corin Electrophysiologist: Joycelyn Schmid Pacing:  98%  10/12/2020 Office Weight: 184 lbs 03/05/2022 Weight:  Unable to weigh at home   AT/AF Burden <1% (taking Aspirin & Plavix)   Spoke with husband [name] and heart failure questions reviewed.  Transmission results reviewed.  Pt asymptomatic for fluid accumulation.     Coruve thoracic impedance suggesting normal fluid levels.    Prescribed:  Furosemide 20 mg stopped at hospital discharge on 04/25/22.    Labs: 04/25/2022 Creatinine 1.14, BUN 14, Potassium 4.1, Sodium 142, GFR 51  04/24/2022 Creatinine 1.08, BUN 12, Potassium 3.8, Sodium 139, GFR 55  04/23/2022 Creatinine 1.07, BUN 10, Potassium 3.7, Sodium 145, GFR 55  04/22/2022 Creatinine 1.06, BUN   9, Potassium 4.0, Sodium 142, GFR 56 04/21/2022 Creatinine 0.97, BUN 10, Potassium 3.8, Sodium 138, GFR >60  04/19/2022 Creatinine 1.14, BUN 11, Potassium 3.7, Sodium 145, GFR 51  04/18/2022 Creatinine 1.17, BUN 13, Potassium 3.7, Sodium 140, GFR 50  04/17/2022 Creatinine 1.06, BUN 15, Potassium 3.3, Sodium 141, GFR 56  04/16/2022 Creatinine 1.05, BUN 18, Potassium 3.4, Sodium 142, GFR 56  04/15/2022 Creatinine 1.17, BUN 24, Potassium 4.2, Sodium 141, GFR 50  04/14/2022 Creatinine 1.44, BUN 28, Potassium 4.3, Sodium 144, GFR 39  04/13/2022 Creatinine 2.28, BUN 44, Potassium 3.4, Sodium 140, GFR 22 A complete set of results can be found in Results Review.   Recommendations:  No changes and encouraged to call if experiencing any fluid symptoms.   Follow-up plan: ICM clinic phone appointment on 06/10/2022 to recheck fluid levels.  91 day device clinic remote transmission scheduled 07/04/2022.     EP/Cardiology Office Visits:   Canceled 03/08/2022 with Dr Kirke Corin.     Last OV with Dr Graciela Husbands was 07/16/2017.    Copy of ICM check sent to  Dr. Graciela Husbands.       3 month ICM trend: 05/20/2022.    12-14 Month ICM trend:     Karie Soda, RN 05/21/2022 10:46 AM

## 2022-06-03 ENCOUNTER — Telehealth: Payer: Self-pay | Admitting: *Deleted

## 2022-06-03 NOTE — Progress Notes (Signed)
  Care Coordination  Outreach Note  06/03/2022 Name: Stacy Grimes MRN: [id number] DOB: 07-26-1950   Care Coordination Outreach Attempts: An unsuccessful telephone outreach was attempted today to offer the patient information about available care coordination services as a benefit of their health plan.   Follow Up Plan:  Additional outreach attempts will be made to offer the patient care coordination information and services.   Encounter Outcome:  No Answer  Burman Nieves, CCMA Care Coordination Care Guide Direct Dial: [phone]

## 2022-06-03 NOTE — Progress Notes (Signed)
  Care Coordination   Note   06/03/2022 Name: [name] MRN: [id number] DOB: [birth year]  Stacy Grimes is a 72 y.o. year old female who sees [name], MD for primary care. I reached out to Stacy Grimes by phone today to offer care coordination services.  [name] was given information about Care Coordination services today including:   The Care Coordination services include support from the care team which includes your Nurse Coordinator, Clinical Social Worker, or Pharmacist.  The Care Coordination team is here to help remove barriers to the health concerns and goals most important to you. Care Coordination services are voluntary, and the patient may decline or stop services at any time by request to their care team member.   Care Coordination Consent Status: Patient agreed to services and verbal consent obtained.   Follow up plan:  Telephone appointment with care coordination team member scheduled for:  06/10/2022  Encounter Outcome:  Pt. Scheduled  Burman Nieves, CCMA Care Coordination Care Guide Direct Dial: [phone]

## 2022-06-05 ENCOUNTER — Other Ambulatory Visit: Payer: Self-pay

## 2022-06-05 ENCOUNTER — Emergency Department
Admission: EM | Admit: 2022-06-05 | Discharge: 2022-06-06 | Disposition: A | Discharge status: Home / Self Care | Payer: Medicare HMO | Attending: Emergency Medicine | Admitting: Emergency Medicine

## 2022-06-05 ENCOUNTER — Emergency Department: Payer: Medicare HMO

## 2022-06-05 DIAGNOSIS — R569 Unspecified convulsions: Secondary | ICD-10-CM | POA: Diagnosis not present

## 2022-06-05 DIAGNOSIS — E860 Dehydration: Secondary | ICD-10-CM | POA: Insufficient documentation

## 2022-06-05 DIAGNOSIS — R197 Diarrhea, unspecified: Secondary | ICD-10-CM | POA: Diagnosis not present

## 2022-06-05 DIAGNOSIS — I959 Hypotension, unspecified: Secondary | ICD-10-CM | POA: Diagnosis not present

## 2022-06-05 DIAGNOSIS — R531 Weakness: Secondary | ICD-10-CM | POA: Insufficient documentation

## 2022-06-05 DIAGNOSIS — F0390 Unspecified dementia without behavioral disturbance: Secondary | ICD-10-CM | POA: Diagnosis not present

## 2022-06-05 DIAGNOSIS — R0689 Other abnormalities of breathing: Secondary | ICD-10-CM | POA: Diagnosis not present

## 2022-06-05 DIAGNOSIS — R001 Bradycardia, unspecified: Secondary | ICD-10-CM | POA: Diagnosis not present

## 2022-06-05 LAB — URINALYSIS, W/ REFLEX TO CULTURE (INFECTION SUSPECTED)
Bilirubin Urine: NEGATIVE
Glucose, UA: NEGATIVE mg/dL
Hgb urine dipstick: NEGATIVE
Ketones, ur: NEGATIVE mg/dL
Nitrite: NEGATIVE
Protein, ur: NEGATIVE mg/dL
Specific Gravity, Urine: 1.005 (ref 1.005–1.030)
WBC, UA: >50 WBC/hpf (ref 0–5)
pH: 6 (ref 5.0–8.0)

## 2022-06-05 LAB — COMPREHENSIVE METABOLIC PANEL
ALT: 8 U/L (ref 0–44)
AST: 24 U/L (ref 15–41)
Albumin: 1.8 g/dL — ABNORMAL LOW (ref 3.5–5.0)
Alkaline Phosphatase: 181 U/L — ABNORMAL HIGH (ref 38–126)
Anion gap: 9 (ref 5–15)
BUN: 9 mg/dL (ref 8–23)
CO2: 29 mmol/L (ref 22–32)
Calcium: 8.1 mg/dL — ABNORMAL LOW (ref 8.9–10.3)
Chloride: 102 mmol/L (ref 98–111)
Creatinine, Ser: 1.01 mg/dL — ABNORMAL HIGH (ref 0.44–1.00)
GFR, Estimated: 59 mL/min — ABNORMAL LOW (ref >60)
Glucose, Bld: 92 mg/dL (ref 70–99)
Potassium: 2.8 mmol/L — ABNORMAL LOW (ref 3.5–5.1)
Sodium: 140 mmol/L (ref 135–145)
Total Bilirubin: 0.5 mg/dL (ref 0.3–1.2)
Total Protein: 6.6 g/dL (ref 6.5–8.1)

## 2022-06-05 LAB — CBC WITH DIFFERENTIAL/PLATELET
Abs Immature Granulocytes: 0.01 10*3/uL (ref 0.00–0.07)
Basophils Absolute: 0.1 10*3/uL (ref 0.0–0.1)
Basophils Relative: 1 %
Eosinophils Absolute: 0.1 10*3/uL (ref 0.0–0.5)
Eosinophils Relative: 3 %
HCT: 25.6 % — ABNORMAL LOW (ref 36.0–46.0)
Hemoglobin: 7.8 g/dL — ABNORMAL LOW (ref 12.0–15.0)
Immature Granulocytes: 0 %
Lymphocytes Relative: 31 %
Lymphs Abs: 1.6 10*3/uL (ref 0.7–4.0)
MCH: 25.6 pg — ABNORMAL LOW (ref 26.0–34.0)
MCHC: 30.5 g/dL (ref 30.0–36.0)
MCV: 83.9 fL (ref 80.0–100.0)
Monocytes Absolute: 0.7 10*3/uL (ref 0.1–1.0)
Monocytes Relative: 13 %
Neutro Abs: 2.8 10*3/uL (ref 1.7–7.7)
Neutrophils Relative %: 52 %
Platelets: UNDETERMINED 10*3/uL (ref 150–400)
RBC: 3.05 MIL/uL — ABNORMAL LOW (ref 3.87–5.11)
RDW: 23.6 % — ABNORMAL HIGH (ref 11.5–15.5)
Smear Review: UNDETERMINED
WBC: 5.3 10*3/uL (ref 4.0–10.5)
nRBC: 0 % (ref 0.0–0.2)

## 2022-06-05 LAB — LACTIC ACID, PLASMA
Lactic Acid, Venous: 1.5 mmol/L (ref 0.5–1.9)
Lactic Acid, Venous: 1 mmol/L (ref 0.5–1.9)

## 2022-06-05 LAB — TROPONIN I (HIGH SENSITIVITY)
Troponin I (High Sensitivity): 11 ng/L (ref <18)
Troponin I (High Sensitivity): 10 ng/L (ref <18)

## 2022-06-05 LAB — CK: Total CK: 54 U/L (ref 38–234)

## 2022-06-05 LAB — BRAIN NATRIURETIC PEPTIDE: B Natriuretic Peptide: 48 pg/mL (ref 0.0–100.0)

## 2022-06-05 LAB — CBG MONITORING, ED: Glucose-Capillary: 100 mg/dL — ABNORMAL HIGH (ref 70–99)

## 2022-06-05 MED ORDER — LORAZEPAM 2 MG/ML IJ SOLN: 0.5000 mg | Freq: Once | INTRAMUSCULAR | Status: DC | PRN

## 2022-06-05 MED ORDER — SODIUM CHLORIDE 0.9 % IV BOLUS
1000.0000 mL | Freq: Once | INTRAVENOUS | Status: AC
  Administered 2022-06-05: 1000 mL via INTRAVENOUS

## 2022-06-05 NOTE — ED Notes (Addendum)
CBG 100 

## 2022-06-05 NOTE — ED Triage Notes (Addendum)
Pt presents to ED via [facility] with c/o of possible seizure and also weakness. Pt has HX of dementia. Pt lives at home with husband. Pt also states diarrhea, sores to buttock area. Pt unsure of when the buttocks became like this. Pt is oriented to person but not place or situation. NAD noted. Pt wears 4L/min via [place] chronically.   Has an excoriated area on both side of buttocks, no open wounds at this time but skin breakdown noted, pt repositioned off buttock area onto side to help alleviate pressure on wound area. NAD noted at this time.

## 2022-06-05 NOTE — ED Provider Notes (Signed)
[facility] Provider Note   Event Date/Time   First MD Initiated Contact with Patient 06/05/22 1729     (approximate) History  Weakness  HPI Stacy Grimes is a 73 y.o. female with past medical history of dementia who presents to the emergency department via EMS with complaints of possible seizure-like activity as well as generalized weakness.  Patient lives at home with husband and states that she has been having diarrhea with a sore on her buttocks due to excoriation from wiping.  Patient wears 4 L nasal cannula chronically and has not had to increase.  Altered mental status episodes are described as short episodes of decreased responsiveness ROS: Patient currently denies any vision changes, tinnitus, difficulty speaking, facial droop, sore throat, chest pain, shortness of breath, abdominal pain, nausea/vomiting, dysuria, or weakness/numbness/paresthesias in any extremity   Physical Exam  Triage Vital Signs: ED Triage Vitals  Enc Vitals Group     BP 06/05/22 1728 (!) 97/54     Pulse Rate 06/05/22 1728 60     Resp 06/05/22 1728 15     Temp 06/05/22 1728 (!) 97.5 F (36.4 C)     Temp Source 06/05/22 1728 Axillary     SpO2 06/05/22 1728 100 %     Weight 06/05/22 1739 133 lb 6.1 oz (60.5 kg)     Height --      Head Circumference --      Peak Flow --      Pain Score 06/05/22 1730 6     Pain Loc --      Pain Edu? --      Excl. in GC? --    Most recent vital signs: Vitals:   06/05/22 2000 06/05/22 2100  BP: (!) 155/58 123/74  Pulse: 65 62  Resp: 18 17  Temp:    SpO2: 100% 100%   General: Awake, oriented x3 CV:  Good peripheral perfusion.  Resp:  Normal effort.  4 L nasal cannula in place Abd:  No distention.  Other:  Elderly overweight Caucasian female laying in bed in no acute distress ED Results / Procedures / Treatments  Labs (all labs ordered are listed, but only abnormal results are displayed) Labs Reviewed  COMPREHENSIVE METABOLIC PANEL -  Abnormal; Notable for the following components:      Result Value   Potassium 2.8 (*)    Creatinine, Ser 1.01 (*)    Calcium 8.1 (*)    Albumin 1.8 (*)    Alkaline Phosphatase 181 (*)    GFR, Estimated 59 (*)    All other components within normal limits  CBC WITH DIFFERENTIAL/PLATELET - Abnormal; Notable for the following components:   RBC 3.05 (*)    Hemoglobin 7.8 (*)    HCT 25.6 (*)    MCH 25.6 (*)    RDW 23.6 (*)    All other components within normal limits  URINALYSIS, W/ REFLEX TO CULTURE (INFECTION SUSPECTED) - Abnormal; Notable for the following components:   Color, Urine YELLOW (*)    APPearance CLOUDY (*)    Leukocytes,Ua LARGE (*)    Bacteria, UA FEW (*)    All other components within normal limits  CBG MONITORING, ED - Abnormal; Notable for the following components:   Glucose-Capillary 100 (*)    All other components within normal limits  URINE CULTURE  BRAIN NATRIURETIC PEPTIDE  LACTIC ACID, PLASMA  LACTIC ACID, PLASMA  CK  LACOSAMIDE  TROPONIN I (HIGH SENSITIVITY)  TROPONIN I (HIGH SENSITIVITY)  EKG ED ECG REPORT I, Merwyn Katos, the attending physician, personally viewed and interpreted this ECG. Date: 06/05/2022 EKG Time: 1739 Rate: 61 Rhythm: normal sinus rhythm QRS Axis: normal Intervals: LBBB ST/T Wave abnormalities: normal Narrative Interpretation: NSR w LBBB. no evidence of acute ischemia RADIOLOGY ED MD interpretation: One-view portable chest x-ray interpreted by me shows no evidence of acute abnormalities including no pneumonia, pneumothorax, or widened mediastinum  CT of the head without contrast interpreted by me shows no evidence of acute abnormalities including no intracerebral hemorrhage, obvious masses, or significant edema -Agree with radiology assessment Official radiology report(s): DG Chest Port 1 View  Result Date: 06/05/2022 CLINICAL DATA:  Possible seizure.  Weakness.  History of dementia. EXAM: PORTABLE CHEST 1 VIEW  COMPARISON:  04/11/2022 FINDINGS: Stable cardiomediastinal silhouette. Left chest wall CRT-D. No focal consolidation, pleural effusion, or pneumothorax. No displaced rib fractures. IMPRESSION: No active disease. Electronically Signed   By: Minerva Fester M.D.   On: 06/05/2022 18:32   CT Head Wo Contrast  Result Date: 06/05/2022 CLINICAL DATA:  Seizures EXAM: CT HEAD WITHOUT CONTRAST TECHNIQUE: Contiguous axial images were obtained from the base of the skull through the vertex without intravenous contrast. RADIATION DOSE REDUCTION: This exam was performed according to the departmental dose-optimization program which includes automated exposure control, adjustment of the mA and/or kV according to patient size and/or use of iterative reconstruction technique. COMPARISON:  04/11/2022 FINDINGS: Brain: No acute intracranial findings are seen. There are no signs of bleeding within the cranium. There are foci of encephalomalacia in right parietal, left parietal and left occipital lobes with no interval change suggesting old infarcts. Cortical sulci are prominent. There is decreased density in periventricular white matter. Vascular: Unremarkable. Skull: No acute findings are seen in calvarium. Hyperostosis frontalis interna is seen. There is opacification of most of the right mastoid air cells. There is no break in the cortical margins. Sinuses/Orbits: No acute findings are seen. Other: None. IMPRESSION: No acute intracranial findings are seen. 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 Atrophy. Small vessel disease. 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 Electronically Signed   By: Ernie Avena M.D.   On: 06/05/2022 18:27   PROCEDURES: Critical Care performed: No .1-3 Lead EKG Interpretation  Performed by: Merwyn Katos, MD Authorized by: Merwyn Katos,  MD     Interpretation: normal     ECG rate:  71   ECG rate assessment: normal     Rhythm: sinus rhythm     Ectopy: none     Conduction: normal    MEDICATIONS ORDERED IN ED: Medications  LORazepam (ATIVAN) injection 0.5 mg (has no administration in time range)  sodium chloride 0.9 % bolus 1,000 mL (1,000 mLs Intravenous Bolus 06/05/22 1825)   IMPRESSION / MDM / ASSESSMENT AND PLAN / ED COURSE  I reviewed the triage vital signs and the nursing notes.                             The patient is on the cardiac monitor to evaluate for evidence of arrhythmia and/or significant heart rate changes. Patient's presentation is most consistent with acute presentation with potential threat to life or bodily function. The patient suffered an episode of altered mental status, but there is no overt concern for a dangerous emergent cause such as, but  not limited to, CNS infection, severe Toxidrome, severe metabolic derangement, or stroke.  Given History, Physical, and Workup the cause appears to be hypotension secondary to dehydration  Disposition: Discharge. At the time of discharge, the patient is back to baseline mental status.   FINAL CLINICAL IMPRESSION(S) / ED DIAGNOSES   Final diagnoses:  Generalized weakness  Dehydration   Rx / DC Orders   ED Discharge Orders     None      Note:  This document was prepared using Dragon voice recognition software and may include unintentional dictation errors.   Merwyn Katos, MD 06/06/22 [id number]

## 2022-06-06 DIAGNOSIS — G309 Alzheimer's disease, unspecified: Secondary | ICD-10-CM | POA: Diagnosis not present

## 2022-06-06 DIAGNOSIS — F0280 Dementia in other diseases classified elsewhere without behavioral disturbance: Secondary | ICD-10-CM | POA: Diagnosis not present

## 2022-06-06 DIAGNOSIS — R0902 Hypoxemia: Secondary | ICD-10-CM | POA: Diagnosis not present

## 2022-06-06 DIAGNOSIS — R4182 Altered mental status, unspecified: Secondary | ICD-10-CM | POA: Diagnosis not present

## 2022-06-06 DIAGNOSIS — Z7401 Bed confinement status: Secondary | ICD-10-CM | POA: Diagnosis not present

## 2022-06-06 DIAGNOSIS — G40909 Epilepsy, unspecified, not intractable, without status epilepticus: Secondary | ICD-10-CM | POA: Diagnosis not present

## 2022-06-06 DIAGNOSIS — I509 Heart failure, unspecified: Secondary | ICD-10-CM | POA: Diagnosis not present

## 2022-06-06 DIAGNOSIS — I959 Hypotension, unspecified: Secondary | ICD-10-CM | POA: Diagnosis not present

## 2022-06-06 DIAGNOSIS — J9610 Chronic respiratory failure, unspecified whether with hypoxia or hypercapnia: Secondary | ICD-10-CM | POA: Diagnosis not present

## 2022-06-06 DIAGNOSIS — R531 Weakness: Secondary | ICD-10-CM | POA: Diagnosis not present

## 2022-06-06 DIAGNOSIS — R262 Difficulty in walking, not elsewhere classified: Secondary | ICD-10-CM | POA: Diagnosis not present

## 2022-06-06 NOTE — ED Notes (Signed)
called to [facility] for transport home/rep:chelesa.. 

## 2022-06-07 LAB — URINE CULTURE: Culture: >=100000 — AB

## 2022-06-08 LAB — URINE CULTURE

## 2022-06-10 ENCOUNTER — Ambulatory Visit: Payer: Medicare HMO | Attending: Internal Medicine

## 2022-06-10 ENCOUNTER — Ambulatory Visit: Payer: Self-pay | Admitting: *Deleted

## 2022-06-10 DIAGNOSIS — Z95810 Presence of automatic (implantable) cardiac defibrillator: Secondary | ICD-10-CM

## 2022-06-10 DIAGNOSIS — I5022 Chronic systolic (congestive) heart failure: Secondary | ICD-10-CM

## 2022-06-10 NOTE — Patient Outreach (Signed)
  Care Coordination   Initial Visit Note   06/11/2022 Name: Stacy Grimes MRN: [id number] DOB: 01/07/1951  Stacy Grimes is a 72 y.o. year old female who sees Stacy Baseman, MD for primary care. I spoke with Stacy Grimes, husband of Stacy Grimes by phone today.  What matters to the patients health and wellness today?  Changing DME company and keeping patient in the home.  Stacy Grimes is helping to care for patient.     Goals Addressed             This Visit's Progress    Obtain new DME company       Care Coordination Interventions: Basic overview and discussion of pathophysiology of Heart Failure reviewed Provided education on low sodium diet Discussed the importance of keeping all appointments with provider Screening for signs and symptoms of depression related to chronic disease state  Assessed social determinant of health barriers  Provided patient with basic written and verbal COPD education on self care/management/and exacerbation prevention Provided written and verbal instructions on pursed lip breathing and utilized returned demonstration as teach back Provided instruction about proper use of medications used for management of COPD including inhalers         SDOH assessments and interventions completed:  Yes  SDOH Interventions Today    Flowsheet Row Most Recent Value  SDOH Interventions   Food Insecurity Interventions Intervention Not Indicated  Housing Interventions Intervention Not Indicated  Transportation Interventions Intervention Not Indicated        Care Coordination Interventions:  Yes, provided   Interventions Today    Flowsheet Row Most Recent Value  Chronic Disease   Chronic disease during today's visit Chronic Obstructive Pulmonary Disease (COPD), Congestive Heart Failure (CHF)  General Interventions   General Interventions Discussed/Reviewed General Interventions Reviewed, Doctor Visits, Durable Medical Equipment (DME)  Doctor Visits  Discussed/Reviewed Doctor Visits Reviewed, PCP  [No in person visit with PCP in a while, has one scheduled but husband unsure if he will be able to get her office. Will need  EMS transport, but insurance may not pay for it. He will call to follow up. Encouraged to consider provider to visit home]  Durable Medical Equipment (DME) Oxygen  [Using [employer], reportedly not keeping up with maintanence, looking to change vendors.  Will call [employer] as this is covered by [employer]]  Exercise Interventions   Exercise Discussed/Reviewed Physical Activity  Physical Activity Discussed/Reviewed Physical Activity Reviewed  [Working with [facility] for PT, will call to restart services]  Education Interventions   Education Provided Provided Education  Provided Verbal Education On [employer], When to see the doctor  Stacy Grimes to call insurance to see if non-emergent EMS transport is covered.  BP usually 115/60]       Follow up plan: Follow up call scheduled for 5/24    Encounter Outcome:  Pt. Visit Completed   Stacy Durie, RN, MSN, [facility] Care Management Care Management Coordinator [phone]

## 2022-06-11 ENCOUNTER — Encounter: Payer: Self-pay | Admitting: *Deleted

## 2022-06-11 ENCOUNTER — Telehealth: Payer: Self-pay

## 2022-06-11 LAB — LACOSAMIDE: Lacosamide: <0.5 ug/mL — ABNORMAL LOW (ref 5.0–10.0)

## 2022-06-11 NOTE — Telephone Encounter (Signed)
Remote ICM transmission received.  Attempted call to patient regarding ICM remote transmission and left detailed message per DPR.  Advised to return call for any fluid symptoms or questions. Next ICM remote transmission scheduled 07/08/2022.    

## 2022-06-11 NOTE — Progress Notes (Signed)
EPIC Encounter for ICM Monitoring  Patient Name: Stacy Grimes is a 72 y.o. female Date: 06/11/2022 Primary Care Physican: [name], MD Primary Cardiologist: Kirke Corin Electrophysiologist: Joycelyn Schmid Pacing:  98%  10/12/2020 Office Weight: 184 lbs 03/05/2022 Weight:  Unable to weigh at home   AT/AF Burden <1% (taking Aspirin & Plavix)   Attempted call to husband, [name] and unable to reach.  Left detailed message per DPR regarding transmission. Transmission reviewed.   ED Visit on 5/1.    Coruve thoracic impedance suggesting normal fluid levels.    Prescribed:  Furosemide 20 mg stopped at hospital discharge on 04/25/22.    Labs: 06/05/2022 Creatinine 1.01, BUN 9,   Potassium 2.8, Sodium 140, GFR 59 04/25/2022 Creatinine 1.14, BUN 14, Potassium 4.1, Sodium 142, GFR 51  04/24/2022 Creatinine 1.08, BUN 12, Potassium 3.8, Sodium 139, GFR 55  04/23/2022 Creatinine 1.07, BUN 10, Potassium 3.7, Sodium 145, GFR 55  04/22/2022 Creatinine 1.06, BUN   9, Potassium 4.0, Sodium 142, GFR 56 04/21/2022 Creatinine 0.97, BUN 10, Potassium 3.8, Sodium 138, GFR >60  A complete set of results can be found in Results Review.   Recommendations:  No changes and encouraged to call if experiencing any fluid symptoms.   Follow-up plan: ICM clinic phone appointment on 07/08/2022.  91 day device clinic remote transmission scheduled 07/04/2022.     EP/Cardiology Office Visits:   Canceled 03/08/2022 with Dr Kirke Corin.     Last OV with Dr Graciela Husbands was 07/16/2017.    Copy of ICM check sent to Dr. Graciela Husbands.       3 month ICM trend: 06/10/2022.    12-14 Month ICM trend:     Karie Soda, RN 06/11/2022 1:59 PM

## 2022-06-24 ENCOUNTER — Inpatient Hospital Stay
Admission: EM | Admit: 2022-06-24 | Discharge: 2022-07-06 | DRG: 682 | Disposition: A | Discharge status: Home Health | Payer: Medicare HMO | Attending: Internal Medicine | Admitting: Internal Medicine

## 2022-06-24 ENCOUNTER — Inpatient Hospital Stay: Payer: Medicare HMO

## 2022-06-24 ENCOUNTER — Emergency Department: Payer: Medicare HMO

## 2022-06-24 DIAGNOSIS — E876 Hypokalemia: Secondary | ICD-10-CM | POA: Diagnosis not present

## 2022-06-24 DIAGNOSIS — Z95810 Presence of automatic (implantable) cardiac defibrillator: Secondary | ICD-10-CM | POA: Diagnosis not present

## 2022-06-24 DIAGNOSIS — D649 Anemia, unspecified: Secondary | ICD-10-CM | POA: Diagnosis present

## 2022-06-24 DIAGNOSIS — F32A Depression, unspecified: Secondary | ICD-10-CM | POA: Diagnosis present

## 2022-06-24 DIAGNOSIS — M6281 Muscle weakness (generalized): Secondary | ICD-10-CM | POA: Diagnosis not present

## 2022-06-24 DIAGNOSIS — I472 Ventricular tachycardia, unspecified: Secondary | ICD-10-CM | POA: Diagnosis not present

## 2022-06-24 DIAGNOSIS — K219 Gastro-esophageal reflux disease without esophagitis: Secondary | ICD-10-CM | POA: Diagnosis present

## 2022-06-24 DIAGNOSIS — I509 Heart failure, unspecified: Secondary | ICD-10-CM

## 2022-06-24 DIAGNOSIS — I5022 Chronic systolic (congestive) heart failure: Secondary | ICD-10-CM | POA: Diagnosis not present

## 2022-06-24 DIAGNOSIS — Z8249 Family history of ischemic heart disease and other diseases of the circulatory system: Secondary | ICD-10-CM

## 2022-06-24 DIAGNOSIS — Z7401 Bed confinement status: Secondary | ICD-10-CM | POA: Diagnosis not present

## 2022-06-24 DIAGNOSIS — E11649 Type 2 diabetes mellitus with hypoglycemia without coma: Secondary | ICD-10-CM | POA: Diagnosis not present

## 2022-06-24 DIAGNOSIS — N390 Urinary tract infection, site not specified: Secondary | ICD-10-CM | POA: Diagnosis present

## 2022-06-24 DIAGNOSIS — Z91041 Radiographic dye allergy status: Secondary | ICD-10-CM

## 2022-06-24 DIAGNOSIS — R0902 Hypoxemia: Secondary | ICD-10-CM | POA: Diagnosis not present

## 2022-06-24 DIAGNOSIS — E039 Hypothyroidism, unspecified: Secondary | ICD-10-CM | POA: Diagnosis present

## 2022-06-24 DIAGNOSIS — R569 Unspecified convulsions: Secondary | ICD-10-CM

## 2022-06-24 DIAGNOSIS — I272 Pulmonary hypertension, unspecified: Secondary | ICD-10-CM | POA: Diagnosis present

## 2022-06-24 DIAGNOSIS — Z794 Long term (current) use of insulin: Secondary | ICD-10-CM

## 2022-06-24 DIAGNOSIS — I951 Orthostatic hypotension: Secondary | ICD-10-CM | POA: Diagnosis present

## 2022-06-24 DIAGNOSIS — J449 Chronic obstructive pulmonary disease, unspecified: Secondary | ICD-10-CM | POA: Diagnosis not present

## 2022-06-24 DIAGNOSIS — L89312 Pressure ulcer of right buttock, stage 2: Secondary | ICD-10-CM | POA: Diagnosis not present

## 2022-06-24 DIAGNOSIS — L89322 Pressure ulcer of left buttock, stage 2: Secondary | ICD-10-CM | POA: Diagnosis present

## 2022-06-24 DIAGNOSIS — J9611 Chronic respiratory failure with hypoxia: Secondary | ICD-10-CM | POA: Diagnosis present

## 2022-06-24 DIAGNOSIS — E46 Unspecified protein-calorie malnutrition: Secondary | ICD-10-CM | POA: Diagnosis not present

## 2022-06-24 DIAGNOSIS — Z961 Presence of intraocular lens: Secondary | ICD-10-CM | POA: Diagnosis present

## 2022-06-24 DIAGNOSIS — Z515 Encounter for palliative care: Secondary | ICD-10-CM | POA: Diagnosis not present

## 2022-06-24 DIAGNOSIS — I714 Abdominal aortic aneurysm, without rupture, unspecified: Secondary | ICD-10-CM | POA: Diagnosis present

## 2022-06-24 DIAGNOSIS — G9341 Metabolic encephalopathy: Secondary | ICD-10-CM | POA: Diagnosis present

## 2022-06-24 DIAGNOSIS — E861 Hypovolemia: Secondary | ICD-10-CM | POA: Diagnosis not present

## 2022-06-24 DIAGNOSIS — Z9981 Dependence on supplemental oxygen: Secondary | ICD-10-CM

## 2022-06-24 DIAGNOSIS — Z0389 Encounter for observation for other suspected diseases and conditions ruled out: Secondary | ICD-10-CM | POA: Diagnosis not present

## 2022-06-24 DIAGNOSIS — I110 Hypertensive heart disease with heart failure: Secondary | ICD-10-CM | POA: Diagnosis not present

## 2022-06-24 DIAGNOSIS — E871 Hypo-osmolality and hyponatremia: Secondary | ICD-10-CM | POA: Diagnosis not present

## 2022-06-24 DIAGNOSIS — K529 Noninfective gastroenteritis and colitis, unspecified: Secondary | ICD-10-CM | POA: Diagnosis present

## 2022-06-24 DIAGNOSIS — M1990 Unspecified osteoarthritis, unspecified site: Secondary | ICD-10-CM | POA: Diagnosis present

## 2022-06-24 DIAGNOSIS — E872 Acidosis, unspecified: Secondary | ICD-10-CM | POA: Diagnosis not present

## 2022-06-24 DIAGNOSIS — Z79899 Other long term (current) drug therapy: Secondary | ICD-10-CM

## 2022-06-24 DIAGNOSIS — R531 Weakness: Secondary | ICD-10-CM | POA: Diagnosis not present

## 2022-06-24 DIAGNOSIS — R109 Unspecified abdominal pain: Secondary | ICD-10-CM | POA: Diagnosis not present

## 2022-06-24 DIAGNOSIS — Z1152 Encounter for screening for COVID-19: Secondary | ICD-10-CM | POA: Diagnosis not present

## 2022-06-24 DIAGNOSIS — H918X2 Other specified hearing loss, left ear: Secondary | ICD-10-CM | POA: Diagnosis present

## 2022-06-24 DIAGNOSIS — Z7902 Long term (current) use of antithrombotics/antiplatelets: Secondary | ICD-10-CM

## 2022-06-24 DIAGNOSIS — R197 Diarrhea, unspecified: Secondary | ICD-10-CM | POA: Diagnosis not present

## 2022-06-24 DIAGNOSIS — N179 Acute kidney failure, unspecified: Secondary | ICD-10-CM | POA: Diagnosis not present

## 2022-06-24 DIAGNOSIS — J9612 Chronic respiratory failure with hypercapnia: Secondary | ICD-10-CM

## 2022-06-24 DIAGNOSIS — M545 Low back pain, unspecified: Secondary | ICD-10-CM | POA: Diagnosis present

## 2022-06-24 DIAGNOSIS — Z87891 Personal history of nicotine dependence: Secondary | ICD-10-CM

## 2022-06-24 DIAGNOSIS — Z79890 Hormone replacement therapy (postmenopausal): Secondary | ICD-10-CM

## 2022-06-24 DIAGNOSIS — E860 Dehydration: Secondary | ICD-10-CM | POA: Diagnosis not present

## 2022-06-24 DIAGNOSIS — Z7951 Long term (current) use of inhaled steroids: Secondary | ICD-10-CM

## 2022-06-24 DIAGNOSIS — F419 Anxiety disorder, unspecified: Secondary | ICD-10-CM | POA: Diagnosis present

## 2022-06-24 DIAGNOSIS — G4733 Obstructive sleep apnea (adult) (pediatric): Secondary | ICD-10-CM | POA: Diagnosis present

## 2022-06-24 DIAGNOSIS — I428 Other cardiomyopathies: Secondary | ICD-10-CM | POA: Diagnosis present

## 2022-06-24 DIAGNOSIS — F03918 Unspecified dementia, unspecified severity, with other behavioral disturbance: Secondary | ICD-10-CM | POA: Diagnosis present

## 2022-06-24 DIAGNOSIS — Z743 Need for continuous supervision: Secondary | ICD-10-CM | POA: Diagnosis not present

## 2022-06-24 DIAGNOSIS — G8929 Other chronic pain: Secondary | ICD-10-CM | POA: Diagnosis present

## 2022-06-24 DIAGNOSIS — Z885 Allergy status to narcotic agent status: Secondary | ICD-10-CM

## 2022-06-24 DIAGNOSIS — Z90710 Acquired absence of both cervix and uterus: Secondary | ICD-10-CM

## 2022-06-24 DIAGNOSIS — I700 Atherosclerosis of aorta: Secondary | ICD-10-CM | POA: Diagnosis not present

## 2022-06-24 DIAGNOSIS — D638 Anemia in other chronic diseases classified elsewhere: Secondary | ICD-10-CM | POA: Diagnosis present

## 2022-06-24 DIAGNOSIS — Z9049 Acquired absence of other specified parts of digestive tract: Secondary | ICD-10-CM

## 2022-06-24 DIAGNOSIS — I4729 Other ventricular tachycardia: Secondary | ICD-10-CM | POA: Diagnosis present

## 2022-06-24 DIAGNOSIS — E1165 Type 2 diabetes mellitus with hyperglycemia: Secondary | ICD-10-CM | POA: Diagnosis not present

## 2022-06-24 DIAGNOSIS — I959 Hypotension, unspecified: Secondary | ICD-10-CM | POA: Diagnosis not present

## 2022-06-24 LAB — CBC WITH DIFFERENTIAL/PLATELET
Abs Immature Granulocytes: 0.13 10*3/uL — ABNORMAL HIGH (ref 0.00–0.07)
Basophils Absolute: 0 10*3/uL (ref 0.0–0.1)
Basophils Relative: 0 %
Eosinophils Absolute: 0 10*3/uL (ref 0.0–0.5)
Eosinophils Relative: 0 %
HCT: 30.4 % — ABNORMAL LOW (ref 36.0–46.0)
Hemoglobin: 9.3 g/dL — ABNORMAL LOW (ref 12.0–15.0)
Immature Granulocytes: 1 %
Lymphocytes Relative: 9 %
Lymphs Abs: 1.4 10*3/uL (ref 0.7–4.0)
MCH: 24.7 pg — ABNORMAL LOW (ref 26.0–34.0)
MCHC: 30.6 g/dL (ref 30.0–36.0)
MCV: 80.9 fL (ref 80.0–100.0)
Monocytes Absolute: 0.5 10*3/uL (ref 0.1–1.0)
Monocytes Relative: 3 %
Neutro Abs: 12.5 10*3/uL — ABNORMAL HIGH (ref 1.7–7.7)
Neutrophils Relative %: 87 %
Platelets: UNDETERMINED 10*3/uL (ref 150–400)
RBC: 3.76 MIL/uL — ABNORMAL LOW (ref 3.87–5.11)
RDW: 24.4 % — ABNORMAL HIGH (ref 11.5–15.5)
Smear Review: UNDETERMINED
WBC: 14.5 10*3/uL — ABNORMAL HIGH (ref 4.0–10.5)
nRBC: 0 % (ref 0.0–0.2)

## 2022-06-24 LAB — PROTIME-INR
INR: 1.2 (ref 0.8–1.2)
Prothrombin Time: 15.7 seconds — ABNORMAL HIGH (ref 11.4–15.2)

## 2022-06-24 LAB — RESP PANEL BY RT-PCR (RSV, FLU A&B, COVID)  RVPGX2
Influenza A by PCR: NEGATIVE
Influenza B by PCR: NEGATIVE
Resp Syncytial Virus by PCR: NEGATIVE
SARS Coronavirus 2 by RT PCR: NEGATIVE

## 2022-06-24 LAB — BASIC METABOLIC PANEL
Anion gap: 17 — ABNORMAL HIGH (ref 5–15)
BUN: 59 mg/dL — ABNORMAL HIGH (ref 8–23)
CO2: 15 mmol/L — ABNORMAL LOW (ref 22–32)
Calcium: 6.6 mg/dL — ABNORMAL LOW (ref 8.9–10.3)
Chloride: 96 mmol/L — ABNORMAL LOW (ref 98–111)
Creatinine, Ser: 5.61 mg/dL — ABNORMAL HIGH (ref 0.44–1.00)
GFR, Estimated: 8 mL/min — ABNORMAL LOW (ref >60)
Glucose, Bld: 74 mg/dL (ref 70–99)
Potassium: 2.7 mmol/L — CL (ref 3.5–5.1)
Sodium: 128 mmol/L — ABNORMAL LOW (ref 135–145)

## 2022-06-24 LAB — TSH: TSH: 0.204 u[IU]/mL — ABNORMAL LOW (ref 0.350–4.500)

## 2022-06-24 LAB — COMPREHENSIVE METABOLIC PANEL
ALT: <5 U/L (ref 0–44)
AST: 13 U/L — ABNORMAL LOW (ref 15–41)
Albumin: 3.2 g/dL — ABNORMAL LOW (ref 3.5–5.0)
Alkaline Phosphatase: 169 U/L — ABNORMAL HIGH (ref 38–126)
Anion gap: 21 — ABNORMAL HIGH (ref 5–15)
BUN: 71 mg/dL — ABNORMAL HIGH (ref 8–23)
CO2: 17 mmol/L — ABNORMAL LOW (ref 22–32)
Calcium: 7.5 mg/dL — ABNORMAL LOW (ref 8.9–10.3)
Chloride: 91 mmol/L — ABNORMAL LOW (ref 98–111)
Creatinine, Ser: 7.19 mg/dL — ABNORMAL HIGH (ref 0.44–1.00)
GFR, Estimated: 6 mL/min — ABNORMAL LOW (ref >60)
Glucose, Bld: 122 mg/dL — ABNORMAL HIGH (ref 70–99)
Potassium: 2.6 mmol/L — CL (ref 3.5–5.1)
Sodium: 129 mmol/L — ABNORMAL LOW (ref 135–145)
Total Bilirubin: 0.5 mg/dL (ref 0.3–1.2)
Total Protein: 9 g/dL — ABNORMAL HIGH (ref 6.5–8.1)

## 2022-06-24 LAB — URINALYSIS, ROUTINE W REFLEX MICROSCOPIC
Bilirubin Urine: NEGATIVE
Glucose, UA: NEGATIVE mg/dL
Ketones, ur: NEGATIVE mg/dL
Nitrite: NEGATIVE
Protein, ur: 100 mg/dL — AB
RBC / HPF: >50 RBC/hpf (ref 0–5)
Specific Gravity, Urine: 1.02 (ref 1.005–1.030)
WBC, UA: >50 WBC/hpf (ref 0–5)
pH: 5 (ref 5.0–8.0)

## 2022-06-24 LAB — CBC
HCT: 19.4 % — ABNORMAL LOW (ref 36.0–46.0)
Hemoglobin: 6.1 g/dL — ABNORMAL LOW (ref 12.0–15.0)
MCH: 24.9 pg — ABNORMAL LOW (ref 26.0–34.0)
MCHC: 31.4 g/dL (ref 30.0–36.0)
MCV: 79.2 fL — ABNORMAL LOW (ref 80.0–100.0)
Platelets: 330 10*3/uL (ref 150–400)
RBC: 2.45 MIL/uL — ABNORMAL LOW (ref 3.87–5.11)
RDW: 24.1 % — ABNORMAL HIGH (ref 11.5–15.5)
WBC: 13.8 10*3/uL — ABNORMAL HIGH (ref 4.0–10.5)
nRBC: 0 % (ref 0.0–0.2)

## 2022-06-24 LAB — LACTIC ACID, PLASMA
Lactic Acid, Venous: 2.1 mmol/L (ref 0.5–1.9)
Lactic Acid, Venous: 2 mmol/L (ref 0.5–1.9)

## 2022-06-24 LAB — OSMOLALITY, URINE: Osmolality, Ur: 317 mOsm/kg (ref 300–900)

## 2022-06-24 LAB — C DIFFICILE QUICK SCREEN W PCR REFLEX
C Diff antigen: NEGATIVE
C Diff interpretation: NOT DETECTED
C Diff toxin: NEGATIVE

## 2022-06-24 LAB — MAGNESIUM: Magnesium: 1.6 mg/dL — ABNORMAL LOW (ref 1.7–2.4)

## 2022-06-24 LAB — SODIUM, URINE, RANDOM: Sodium, Ur: 23 mmol/L

## 2022-06-24 MED ORDER — ACETAMINOPHEN 325 MG PO TABS
650.0000 mg | ORAL_TABLET | Freq: Three times a day (TID) | ORAL | Status: DC | PRN
  Administered 2022-06-25 – 2022-06-26 (×3): 650 mg via ORAL
  Filled 2022-06-24 (×3): qty 2

## 2022-06-24 MED ORDER — MIRTAZAPINE 15 MG PO TABS
7.5000 mg | ORAL_TABLET | Freq: Two times a day (BID) | ORAL | Status: DC
  Administered 2022-06-24 – 2022-07-05 (×20): 7.5 mg via ORAL
  Filled 2022-06-24 (×21): qty 1

## 2022-06-24 MED ORDER — TRAZODONE HCL 50 MG PO TABS
50.0000 mg | ORAL_TABLET | Freq: Every evening | ORAL | Status: DC | PRN
  Administered 2022-06-27 – 2022-07-05 (×4): 50 mg via ORAL
  Filled 2022-06-24 (×5): qty 1

## 2022-06-24 MED ORDER — LACTATED RINGERS IV SOLN: INTRAVENOUS | Status: DC

## 2022-06-24 MED ORDER — LEVETIRACETAM 750 MG PO TABS
750.0000 mg | ORAL_TABLET | Freq: Two times a day (BID) | ORAL | Status: DC
  Administered 2022-06-24 – 2022-07-06 (×23): 750 mg via ORAL
  Filled 2022-06-24 (×25): qty 1

## 2022-06-24 MED ORDER — OXYCODONE HCL 5 MG PO TABS
5.0000 mg | ORAL_TABLET | ORAL | Status: DC | PRN
  Administered 2022-06-25 – 2022-07-05 (×16): 5 mg via ORAL
  Filled 2022-06-24 (×17): qty 1

## 2022-06-24 MED ORDER — POTASSIUM CHLORIDE 10 MEQ/100ML IV SOLN
10.0000 meq | INTRAVENOUS | Status: AC
  Administered 2022-06-25 (×2): 10 meq via INTRAVENOUS
  Filled 2022-06-24 (×2): qty 100

## 2022-06-24 MED ORDER — BARRIER CREAM NON-SPECIFIED
1.0000 | TOPICAL_CREAM | Freq: Two times a day (BID) | TOPICAL | Status: DC | PRN
  Administered 2022-06-24: 1 via TOPICAL

## 2022-06-24 MED ORDER — POTASSIUM CHLORIDE 10 MEQ/100ML IV SOLN
10.0000 meq | INTRAVENOUS | Status: AC
  Administered 2022-06-24: 10 meq via INTRAVENOUS
  Filled 2022-06-24: qty 100

## 2022-06-24 MED ORDER — CLOPIDOGREL BISULFATE 75 MG PO TABS
75.0000 mg | ORAL_TABLET | Freq: Every day | ORAL | Status: DC
  Administered 2022-06-24 – 2022-07-06 (×12): 75 mg via ORAL
  Filled 2022-06-24 (×13): qty 1

## 2022-06-24 MED ORDER — ONDANSETRON HCL 4 MG PO TABS
4.0000 mg | ORAL_TABLET | Freq: Three times a day (TID) | ORAL | Status: DC | PRN
  Administered 2022-07-03: 4 mg via ORAL
  Filled 2022-06-24: qty 1

## 2022-06-24 MED ORDER — SODIUM CHLORIDE 0.9 % IV SOLN
2.0000 g | Freq: Once | INTRAVENOUS | Status: AC
  Administered 2022-06-24: 2 g via INTRAVENOUS
  Filled 2022-06-24: qty 12.5

## 2022-06-24 MED ORDER — LEVOTHYROXINE SODIUM 50 MCG PO TABS
100.0000 ug | ORAL_TABLET | Freq: Every day | ORAL | Status: DC
  Administered 2022-06-25 – 2022-06-26 (×2): 100 ug via ORAL
  Filled 2022-06-24 (×2): qty 2

## 2022-06-24 MED ORDER — POTASSIUM CHLORIDE 10 MEQ/100ML IV SOLN
10.0000 meq | INTRAVENOUS | Status: AC
  Administered 2022-06-24 (×3): 10 meq via INTRAVENOUS
  Filled 2022-06-24 (×2): qty 100

## 2022-06-24 MED ORDER — DONEPEZIL HCL 5 MG PO TABS
10.0000 mg | ORAL_TABLET | Freq: Every day | ORAL | Status: DC
  Administered 2022-06-24: 10 mg via ORAL
  Filled 2022-06-24: qty 2

## 2022-06-24 MED ORDER — FOLIC ACID 1 MG PO TABS
1.0000 mg | ORAL_TABLET | Freq: Every day | ORAL | Status: DC
  Administered 2022-06-24 – 2022-07-06 (×12): 1 mg via ORAL
  Filled 2022-06-24 (×13): qty 1

## 2022-06-24 MED ORDER — MIDODRINE HCL 5 MG PO TABS
10.0000 mg | ORAL_TABLET | Freq: Three times a day (TID) | ORAL | Status: DC
  Administered 2022-06-24 – 2022-07-01 (×20): 10 mg via ORAL
  Filled 2022-06-24 (×21): qty 2

## 2022-06-24 MED ORDER — ENOXAPARIN SODIUM 30 MG/0.3ML IJ SOSY
30.0000 mg | PREFILLED_SYRINGE | INTRAMUSCULAR | Status: DC
Start: 2022-06-24 — End: 2022-06-24

## 2022-06-24 MED ORDER — METRONIDAZOLE 500 MG/100ML IV SOLN
500.0000 mg | Freq: Once | INTRAVENOUS | Status: AC
  Administered 2022-06-24: 500 mg via INTRAVENOUS
  Filled 2022-06-24: qty 100

## 2022-06-24 MED ORDER — HEPARIN SODIUM (PORCINE) 5000 UNIT/ML IJ SOLN
5000.0000 [IU] | Freq: Three times a day (TID) | INTRAMUSCULAR | Status: DC
  Administered 2022-06-24 – 2022-07-06 (×29): 5000 [IU] via SUBCUTANEOUS
  Filled 2022-06-24 (×31): qty 1

## 2022-06-24 MED ORDER — INSULIN ASPART 100 UNIT/ML IJ SOLN: 0.0000 [IU] | Freq: Three times a day (TID) | INTRAMUSCULAR | Status: DC

## 2022-06-24 MED ORDER — LACTATED RINGERS IV BOLUS (SEPSIS)
1000.0000 mL | Freq: Once | INTRAVENOUS | Status: AC
  Administered 2022-06-24: 1000 mL via INTRAVENOUS

## 2022-06-24 MED ORDER — FLUTICASONE FUROATE-VILANTEROL 200-25 MCG/ACT IN AEPB
1.0000 | INHALATION_SPRAY | Freq: Every day | RESPIRATORY_TRACT | Status: DC
  Administered 2022-06-28 – 2022-07-06 (×8): 1 via RESPIRATORY_TRACT
  Filled 2022-06-24: qty 28

## 2022-06-24 MED ORDER — ALBUTEROL SULFATE (2.5 MG/3ML) 0.083% IN NEBU: 2.5000 mg | INHALATION_SOLUTION | RESPIRATORY_TRACT | Status: DC | PRN

## 2022-06-24 MED ORDER — POTASSIUM CHLORIDE CRYS ER 20 MEQ PO TBCR
40.0000 meq | EXTENDED_RELEASE_TABLET | Freq: Once | ORAL | Status: AC
  Administered 2022-06-25: 40 meq via ORAL
  Filled 2022-06-24: qty 2

## 2022-06-24 MED ORDER — POLYVINYL ALCOHOL 1.4 % OP SOLN: 1.0000 [drp] | OPHTHALMIC | Status: DC | PRN

## 2022-06-24 MED ORDER — VANCOMYCIN HCL IN DEXTROSE 1-5 GM/200ML-% IV SOLN
1000.0000 mg | Freq: Once | INTRAVENOUS | Status: AC
  Administered 2022-06-24: 1000 mg via INTRAVENOUS
  Filled 2022-06-24: qty 200

## 2022-06-24 MED ORDER — LEVOTHYROXINE SODIUM 50 MCG PO TABS
100.0000 ug | ORAL_TABLET | Freq: Every day | ORAL | Status: DC
  Administered 2022-06-24: 100 ug via ORAL
  Filled 2022-06-24: qty 2

## 2022-06-24 MED ORDER — SODIUM CHLORIDE 0.9% FLUSH
3.0000 mL | Freq: Two times a day (BID) | INTRAVENOUS | Status: DC
  Administered 2022-06-26 – 2022-07-02 (×11): 3 mL via INTRAVENOUS

## 2022-06-24 MED ORDER — MEMANTINE HCL 5 MG PO TABS
10.0000 mg | ORAL_TABLET | Freq: Every day | ORAL | Status: DC
  Administered 2022-06-24 – 2022-06-25 (×2): 10 mg via ORAL
  Filled 2022-06-24 (×2): qty 2

## 2022-06-24 MED ORDER — TIOTROPIUM BROMIDE MONOHYDRATE 18 MCG IN CAPS: 18.0000 ug | ORAL_CAPSULE | Freq: Every day | RESPIRATORY_TRACT | Status: DC | PRN

## 2022-06-24 NOTE — Sepsis Progress Note (Signed)
Sepsis protocol monitored by eLink ?

## 2022-06-24 NOTE — ED Provider Notes (Signed)
-----------------------------------------   5:09 PM on 06/24/2022 -----------------------------------------  I took over care of this patient from Dr. Erma Heritage.  Blood pressure has significantly improved after fluids and midodrine.  I consulted and discussed the case with the hospitalist for admission.   Dionne Bucy, MD 06/24/22 1710

## 2022-06-24 NOTE — H&P (Addendum)
History and Physical    Stacy Grimes ZOX:[id number] DOB: 1951-01-07 DOA: 06/24/2022  PCP: [name], MD  Patient coming from: Home Chief Complaint: Diarrhea, fatigue  HPI: Stacy Grimes is a 72 y.o. female with medical history significant of Systolic CHF (last EF was 50%), h/o COPD and chronic respiratory failure on oxygen, PAH, dementia, HTN (off of metoprolol for 1 week), hypothyroidism, orthostatic hypotension (on midodrine), NSVT (ICD in place), normocytic anemia, DM2, OSA, h/o syncope who presents for ~ 18 days of diarrhea.  Patient is fatigued, and hard of hearing.  Much of history was provided by caregiver/husband  [name].  Since patient was in the ED on April 06, 2022, Stacy Grimes developed diarrhea, up to and beyond 4 episodes per day.  Stools are very similar to any food Stacy Grimes takes in.  Medications are crushed and caregiver is unsure how much is being absorbed.  He notes that Stacy Grimes had antibiotics in March for a UTI.  He had a similar illness at the beginning of May as well, but recovered after about 2 days.  Stacy Grimes has slowly started taking in less PO.  Other symptoms include abdominal pain, chills, sweating at least once, no fever.  Stacy Grimes has had decreased urination, but urine has not changed in color or odor.  Her BP was low at 69/39 when Stacy Grimes came in, fluids and home midodrine helped with this.  [name] did note that Stacy Grimes did not get her midodrine this morning.  Finally, [name] reports that for about 1 week, Stacy Grimes has had a wound on her bottom.  He has been using barrier cream, but this is disturbed every time Stacy Grimes soils her adult diaper.   ED Course: In the ED, Stacy Grimes was noted to have low blood pressure and hypoxia which improved with her home oxygen.  Stacy Grimes has been afebrile.  Labs showed a Na of 129, K of 2.6, Cl of 91, Bicarb of 17, BUN of 71, Cr of 7.19 (baseline around 1), Cal of 7.5, AG of 21, Alb of 3.2, ALP of 169, TP of 9.0, GFR at 6.  L.A was 2.1 and 2.  WBC is 14.5, H/H 9 and 30. Blood  cultures were sent.  Respiratory panel is pending. CT abdomen did not show any acute findings to explain diarrhea. Stacy Grimes received 2L of fluids along with triple antibiotic therapy.  Midodrine 10mg  was given as well which helped to improve blood pressure.   Review of Systems: As per HPI otherwise all other systems reviewed and are negative.  Also with abdominal pain.   Past Medical History:  Diagnosis Date   AAA (abdominal aortic aneurysm) (HCC)    Anginal pain (HCC)    Anxiety    Arthritis    Automatic implantable cardioverter-defibrillator in situ    Biventricular ICD  [employer]    a.  2011 s/p SJM CRT-D; b. 09/2016 gen change: SJM BiV AICD (ser# [id number]).   COPD (chronic obstructive pulmonary disease) (HCC)    Deafness    left ear   Dementia (HCC)    Depression    Dizziness    Fracture    history of spinal fracture   GERD (gastroesophageal reflux disease)    HFrEF (heart failure with reduced ejection fraction) (HCC)    a. 2010 Cath: nonobs dzs, EF 15-20%; b. 09/2017 Echo: EF suboptimal. EF low nl; c. 04/2018 Echo: EF 40-45%; d. 10/2021 Echo: EF 40-45%, glob HK, mildly red RV fxn, sev elev PASP, triv MR, mild-mod TR.  Hypertension    Hypothyroidism    Non-ischemic cardiomyopathy (HCC)    a. 2010 Cath: nonobs dzs, EF 15-20%; b. 2011 s/p SJM CRT-D; c. 09/2016 s/p Gen change; d. 09/2017 Echo: EF suboptimal. EF low nl; e. 04/2018 Echo: EF 40-45%; f. 10/2021 Echo: EF 40-45%, glob HK, mildly red RV fxn, sev elev PASP, triv MR, mild-mod TR.   Oxygen dependent    Palpitations    Sleep apnea     Past Surgical History:  Procedure Laterality Date   ABDOMINAL HYSTERECTOMY     BIV ICD GENERATOR CHANGEOUT N/A 09/17/2016   Procedure: BiV ICD Generator Changeout;  Surgeon: Duke Salvia, MD;  Location: [facility] INVASIVE CV LAB;  Service: Cardiovascular;  Laterality: N/A;   BLADDER SURGERY     CARDIAC CATHETERIZATION     CARDIAC DEFIBRILLATOR PLACEMENT  4 yrs ago   pacemaker   CATARACT EXTRACTION W/PHACO  Left 09/01/2014   Procedure: CATARACT EXTRACTION PHACO AND INTRAOCULAR LENS PLACEMENT (IOC);  Surgeon: Lia Hopping, MD;  Location: [facility] ORS;  Service: Ophthalmology;  Laterality: Left;  [place]: 1:12.1    CATARACT EXTRACTION W/PHACO Right 09/29/2014   Procedure: CATARACT EXTRACTION PHACO AND INTRAOCULAR LENS PLACEMENT (IOC);  Surgeon: Lia Hopping, MD;  Location: [facility] ORS;  Service: Ophthalmology;  Laterality: Right;  [place]: 00:52.7    CHOLECYSTECTOMY     COLON SURGERY     COLONOSCOPY     COLONOSCOPY N/A 08/31/2012   Procedure: COLONOSCOPY;  Surgeon: Louis Meckel, MD;  Location: [facility];  Service: Endoscopy;  Laterality: N/A;   ESOPHAGOGASTRODUODENOSCOPY N/A 07/06/2012   Procedure: ESOPHAGOGASTRODUODENOSCOPY (EGD);  Surgeon: Hart Carwin, MD;  Location: [facility];  Service: Endoscopy;  Laterality: N/A;   INTRAMEDULLARY (IM) NAIL INTERTROCHANTERIC Right 10/23/2021   Procedure: INTRAMEDULLARY (IM) NAIL INTERTROCHANTERIC;  Surgeon: Christena Flake, MD;  Location: [facility] OR[facility];  Service: Orthopedics;  Laterality: Right;   KNEE SURGERY Right    MIDDLE EAR SURGERY     SAVORY DILATION N/A 07/06/2012   Procedure: SAVORY DILATION;  Surgeon: Hart Carwin, MD;  Location: [facility];  Service: Endoscopy;  Laterality: N/A;   TEE WITHOUT CARDIOVERSION N/A 06/23/2020   Procedure: TRANSESOPHAGEAL ECHOCARDIOGRAM (TEE);  Surgeon: Antonieta Iba, MD;  Location: [facility] ORS;  Service: Cardiovascular;  Laterality: N/A;   WRIST SURGERY Right     Social History  reports that Stacy Grimes quit smoking about 20 years ago. Her smoking use included cigarettes. Stacy Grimes has a 60.00 pack-year smoking history. Stacy Grimes has never used smokeless tobacco. Stacy Grimes reports that Stacy Grimes does not drink alcohol and does not use drugs.  Allergies  Allergen Reactions   Codeine Palpitations   Contrast Media [Iodinated Contrast Media] Rash    Family History  Problem Relation Age of Onset   Hypertension Mother    Breast cancer Mother    Stroke  Mother    Heart disease Father    Heart attack Sister    Hypertension Brother    Heart attack Sister    Colon cancer Brother 57    Prior to Admission medications   Medication Sig Start Date End Date Taking? Authorizing Provider  acetaminophen (TYLENOL) 650 MG CR tablet Take 650 mg by mouth at bedtime.    [provider]  albuterol (ACCUNEB) 0.63 MG/3ML nebulizer solution Take 1 ampule by nebulization every 6 (six) hours as needed for wheezing.    [provider]  albuterol (PROVENTIL HFA;VENTOLIN HFA) 108 (90 Base) MCG/ACT inhaler Inhale 2 puffs into the lungs every 6 (six)  hours as needed. 11/02/16   Schaevitz, Myra Rude, MD  allopurinol (ZYLOPRIM) 100 MG tablet Take 0.5 tablets (50 mg total) by mouth daily. 04/25/22   Gillis Santa, MD  atorvastatin (LIPITOR) 80 MG tablet Take 1 tablet (80 mg total) by mouth daily. 07/12/21 07/07/22  Iran Ouch, MD  bisacodyl 5 MG EC tablet Take 2 tablets (10 mg total) by mouth at bedtime as needed for moderate constipation. 04/25/22   Gillis Santa, MD  budesonide-formoterol [facility]) 160-4.5 MCG/ACT inhaler Inhale 2 puffs into the lungs 2 (two) times daily.    [provider]  clopidogrel (PLAVIX) 75 MG tablet Take 1 tablet (75 mg total) by mouth daily. 07/12/21   Iran Ouch, MD  donepezil (ARICEPT) 10 MG tablet Take 10 mg by mouth at bedtime.    [provider]  famotidine (PEPCID) 20 MG tablet Take 1 tablet (20 mg total) by mouth daily. 03/10/22 04/11/22  Alford Highland, MD  folic acid (FOLVITE) 1 MG tablet Take 1 tablet (1 mg total) by mouth daily. 04/26/22 07/25/22  Gillis Santa, MD  gabapentin (NEURONTIN) 100 MG capsule Take 3 capsules (300 mg total) by mouth 2 (two) times daily. 04/25/22 07/24/22  Gillis Santa, MD  hydrocortisone (ANUSOL-HC) 25 MG suppository Place 1 suppository (25 mg total) rectally 2 (two) times daily as needed for hemorrhoids or anal itching. 04/25/22   Gillis Santa, MD  hydrOXYzine  (VISTARIL) 25 MG capsule Take 25 mg by mouth at bedtime as needed. 02/13/22   [provider]  levETIRAcetam (KEPPRA) 750 MG tablet Take 1 tablet (750 mg total) by mouth 2 (two) times daily. 04/24/22 04/24/23  Gillis Santa, MD  levothyroxine (SYNTHROID) 100 MCG tablet Take 100 mcg by mouth daily. 01/04/22   [provider]  memantine (NAMENDA) 10 MG tablet Take 10 mg by mouth in the morning and at bedtime. 07/10/21   [provider]  metoprolol tartrate (LOPRESSOR) 25 MG tablet Take 0.5 tablets (12.5 mg total) by mouth 2 (two) times daily. Hold if SBP <110 mmHg and or HR <65 04/25/22 07/24/22  Gillis Santa, MD  midodrine (PROAMATINE) 5 MG tablet Take 1 tablet (5 mg total) by mouth 3 (three) times daily with meals. Hold if systolic BP >110 05/14/79 07/24/22  Gillis Santa, MD  mirtazapine (REMERON) 7.5 MG tablet Take 7.5 mg by mouth 2 (two) times daily. 10/10/21   [provider]  ondansetron (ZOFRAN) 4 MG tablet Take 1 tablet (4 mg total) by mouth every 8 (eight) hours as needed for nausea or vomiting. 03/10/22   Alford Highland, MD  polyethylene glycol (MIRALAX) 17 g packet Take 17 g by mouth 2 (two) times daily. 04/25/22 07/24/22  Gillis Santa, MD  polyvinyl alcohol (LIQUIFILM TEARS) 1.4 % ophthalmic solution Place 1 drop into both eyes as needed for dry eyes. 03/10/22   Alford Highland, MD  tiotropium (SPIRIVA) 18 MCG inhalation capsule Place 1 capsule (18 mcg total) into inhaler and inhale daily as needed (shortness of breath). 04/12/18   Enid Baas, MD  traZODone (DESYREL) 50 MG tablet Take 1 tablet (50 mg total) by mouth at bedtime as needed for sleep. 04/25/22   Gillis Santa, MD  Vitamin D, Ergocalciferol, (DRISDOL) 1.25 MG (50000 UNIT) CAPS capsule Take 1 capsule (50,000 Units total) by mouth every 7 (seven) days. 04/27/22 07/26/22  Gillis Santa, MD    Physical Exam: Vitals:   06/24/22 1420 06/24/22 1500 06/24/22 1600 06/24/22 1630  BP: (!) 86/59 (!) 80/51 (!)  88/48  105/61  Pulse:  97  85  Resp: 16 15 11 12   Temp:   97.8 F (36.6 C)   TempSrc:      SpO2: 98% 98% 98% 98%  Weight:        Constitutional: Thin, elderly woman, appears ill, responds to voice, hard of hearing  Eyes: lids and conjunctivae normal ENMT: Mucous membranes are very dry Neck: normal, supple Respiratory: Clear anteriorly, no wheezing, no rales at bases, equal intake of breath.  Cardiovascular: RR, normal rhythm, could not appreciate a murmur at this time, no pedal edema Abdomen: + TTP over the lower abdomen, decreased bowel sounds, no distention Musculoskeletal: low bulk, thin extremities, tone is normal Skin: Decreased skin turgor, Stacy Grimes has some areas of dry skin on the feet.  GU: + right sided CVA tenderness, rash on buttocks, redness of both buttocks Neurologic: Encephalopathic, slow to respond, but moving in bed without issue  Psychiatric: Lethargic  Labs on Admission: I have personally reviewed following labs and imaging studies  CBC: Recent Labs  Lab 06/24/22 1318  WBC 14.5*  NEUTROABS 12.5*  HGB 9.3*  HCT 30.4*  MCV 80.9  PLT PLATELET CLUMPS NOTED ON SMEAR, UNABLE TO ESTIMATE    Basic Metabolic Panel: Recent Labs  Lab 06/24/22 1318  NA 129*  K 2.6*  CL 91*  CO2 17*  GLUCOSE 122*  BUN 71*  CREATININE 7.19*  CALCIUM 7.5*    GFR: Estimated Creatinine Clearance: 5.8 mL/min (A) (by C-G formula based on SCr of 7.19 mg/dL (H)).  Liver Function Tests: Recent Labs  Lab 06/24/22 1318  AST 13*  ALT <5  ALKPHOS 169*  BILITOT 0.5  PROT 9.0*  ALBUMIN 3.2*    Urine analysis:    Component Value Date/Time   COLORURINE YELLOW (A) 06/05/2022 1802   APPEARANCEUR CLOUDY (A) 06/05/2022 1802   APPEARANCEUR Clear 07/31/2013 1658   LABSPEC 1.005 06/05/2022 1802   LABSPEC 1.018 07/31/2013 1658   PHURINE 6.0 06/05/2022 1802   GLUCOSEU NEGATIVE 06/05/2022 1802   GLUCOSEU Negative 07/31/2013 1658   HGBUR NEGATIVE 06/05/2022 1802   BILIRUBINUR  NEGATIVE 06/05/2022 1802   BILIRUBINUR Negative 07/31/2013 1658   KETONESUR NEGATIVE 06/05/2022 1802   PROTEINUR NEGATIVE 06/05/2022 1802   UROBILINOGEN 0.2 03/28/2012 1526   NITRITE NEGATIVE 06/05/2022 1802   LEUKOCYTESUR LARGE (A) 06/05/2022 1802   LEUKOCYTESUR Negative 07/31/2013 1658    Radiological Exams on Admission: CT ABDOMEN PELVIS WO CONTRAST  Result Date: 06/24/2022 CLINICAL DATA:  Acute abdominal pain, diarrhea EXAM: CT ABDOMEN AND PELVIS WITHOUT CONTRAST TECHNIQUE: Multidetector CT imaging of the abdomen and pelvis was performed following the standard protocol without IV contrast. RADIATION DOSE REDUCTION: This exam was performed according to the departmental dose-optimization program which includes automated exposure control, adjustment of the mA and/or kV according to patient size and/or use of iterative reconstruction technique. COMPARISON:  CT 04/11/2022 FINDINGS: Lower chest: Transvenous pacing lead to the RV apex. No pleural or pericardial effusion. Hepatobiliary: No focal liver abnormality is seen. Status post cholecystectomy. No biliary dilatation. Pancreas: Unremarkable. No pancreatic ductal dilatation or surrounding inflammatory changes. Spleen: Normal in size without focal abnormality. Adrenals/Urinary Tract: No adrenal mass. Symmetric renal contours without urolithiasis or hydronephrosis. Urinary bladder nondistended. Stomach/Bowel: Stomach is incompletely distended, unremarkable. Small bowel decompressed. Appendix not discretely identified. The colon is incompletely distended, with a few descending diverticula. Anastomotic staple line in the proximal sigmoid segment. No acute findings. Vascular/Lymphatic: Scattered aortoiliac calcified atheromatous plaque without aneurysm. No abdominal or pelvic  adenopathy. Reproductive: Status post hysterectomy. No adnexal masses. Other: Bilateral pelvic phleboliths.  No ascites.  No free air. Musculoskeletal: Stable moderate L1 vertebral  compression deformity. Multilevel lumbar spondylitic change as before. Fixation hardware across right femoral IT fracture. No acute findings. IMPRESSION: 1. No acute findings. 2. Descending colon diverticulosis. 3. Stable L1 vertebral compression deformity. 4.  Aortic Atherosclerosis (ICD10-I70.0). Electronically Signed   By: Corlis Leak M.D.   On: 06/24/2022 14:54   DG Chest Port 1 View  Result Date: 06/24/2022 CLINICAL DATA:  Questionable sepsis, evaluate for abnormality. EXAM: PORTABLE CHEST 1 VIEW COMPARISON:  Radiographs 06/05/2022 and 04/11/2022.  CT 09/21/2009. FINDINGS: 1340 hours. Two views submitted. Unchanged left subclavian AICD with leads projecting to the right atrium, right ventricle and coronary sinus. The heart size and mediastinal contours are stable. The lungs are clear. There is no pleural effusion or pneumothorax. No acute osseous findings are evident. Telemetry leads overlie the chest. IMPRESSION: No evidence of acute cardiopulmonary process. Stable appearance of the AICD. Electronically Signed   By: Carey Bullocks M.D.   On: 06/24/2022 13:54    EKG: Independently reviewed. Interventricular conduction delay, appears similar to previous.  Family states Stacy Grimes has a pacemaker, though pacer spikes are difficult to ascertain.   Assessment/Plan  AKI (acute kidney injury) (HCC) Generalized weakness Dehydration - Related to second problem, subacute diarrhea - IVF - 2L given in the ED, followed by 150cc/hr - Trial of liquid diet to see if Stacy Grimes can tolerate - Check Urine osm, osmolality, urine Na - Check kappa light chains and SPEP - If not improving as expected, would get Nephrology consult - Trend Creatinine - Avoid nephrotoxic medications - CT scan did not show obstruction, will get renal ultrasound as well  Subacute Diarrhea Leukocytosis Mild lactic acidosis - CT scan did not show any clear infection  - Received cefepime, flagyl and vanc in the ED - hold and monitor for  fever - BC X 2 were sent - Cdiff (antibiotics in March) and GI pathogen panel sent - Enteric precautions  Hypokalemia Hypocalcemia Hyponatremia Malnutrition - Check magnesium (added on) - Check urine Na and osm - anticipate this is from GI losses - Replace K with IV - recheck BMET this evening - IVF with LR - 2L received so far, 150cc/hr after this.  - Trend - Once improving, consider nutrition consult - Will need PT/OT prior to discharge, likely.   Paraprotein gap - Possible myeloma kidney? - Renal US - SPEP, IFE, kappa/lamda light chains  Acute metabolic encephalopathy - toxic - related to above issues - Trend - Delirium precautions  Orthostatic hypotension - Restart midodrine, 10mg  TID given in the ED - If BP starts to run high, would decrease to 5mg  TID  Chronic congestive heart failure (HCC) 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 NSVT (nonsustained ventricular tachycardia) (HCC) - Currently not on diuretics per medication review - Monitor for volume overload - Telemetry    Chronic obstructive pulmonary disease, unspecified COPD type (HCC) Chronic respiratory failure with hypoxia (HCC) - Inhalers started, ICS, LABA, LAMA - Albuterol nebs PRN    Type 2 diabetes mellitus with hyperglycemia, unspecified whether long term insulin use (HCC) - Currently not on therapy - A1C 7.0 at last check - SSI started only    Dementia with behavioral disturbance (HCC) - Continue memantine and donepezil - Delirium precautions - Redirection    Seizures (HCC) - Continue home keppra    Hypothyroidism - Continue home synthroid - Check TSH, ? Over  treatment and hyperthyroidism causing GI losses?    Normocytic anemia - Monitor  DVT prophylaxis: Lovenox  Code Status:   Full, confirmed with husband [name]  Family Communication:  [name], on the phone, number in demographics is correct  Disposition Plan:   Patient is from:  Home  Anticipated DC  to:  TBD  Anticipated DC date:  06/29/22  Anticipated DC barriers: Improvement in renal function  Consults called:  None, if not improving, would get nephrology involved  Admission status:  SDU, IP  Severity of Illness: The appropriate patient status for this patient is INPATIENT. Inpatient status is judged to be reasonable and necessary in order to provide the required intensity of service to ensure the patient's safety. The patient's presenting symptoms, physical exam findings, and initial radiographic and laboratory data in the context of their chronic comorbidities is felt to place them at high risk for further clinical deterioration. Furthermore, it is not anticipated that the patient will be medically stable for discharge from the hospital within 2 midnights of admission.   * I certify that at the point of admission it is my clinical judgment that the patient will require inpatient hospital care spanning beyond 2 midnights from the point of admission due to high intensity of service, high risk for further deterioration and high frequency of surveillance required.Debe Coder MD [facility]  How to contact the [facility] Attending or Consulting provider 7A - 7P or covering provider during after hours 7P -7A, for this patient?   Check the care team in [facility] and look for a) attending/consulting [facility] provider listed and b) the [facility] team listed Log into [email or web address] and use [place]'s universal password to access. If you do not have the password, please contact the hospital operator. Locate the [facility] provider you are looking for under [facility] and page to a number that you can be directly reached. If you still have difficulty reaching the provider, please page the [facility] (Director on Call) for the Hospitalists listed on amion for assistance.  06/24/2022, 6:07 PM

## 2022-06-24 NOTE — Progress Notes (Signed)
CODE SEPSIS - PHARMACY COMMUNICATION  **Broad Spectrum Antibiotics should be administered within 1 hour of Sepsis diagnosis**  Time Code Sepsis Called/Page Received: 1313  Antibiotics Ordered: Cefepime + vancomycin + metronidazole  Time of 1st antibiotic administration: 1423  Additional action taken by pharmacy: N/A  Stacy Grimes 06/24/2022  1:36 PM

## 2022-06-24 NOTE — ED Notes (Signed)
Ms Stacy Grimes notified of critical potassium

## 2022-06-24 NOTE — ED Provider Notes (Signed)
[facility] Provider Note    Event Date/Time   First MD Initiated Contact with Patient 06/24/22 1313     (approximate)   History   Diarrhea   HPI  Stacy Grimes is a 72 y.o. female  here with profuse diarrhea and weakness. Per significant other's report, pt has had severe, profuse, watery diarrhea since leaving hospital in May. She has had uncontrollable diarrhea. She has had worsening generalized weakness and has difficulty even getting up now. She has been passing out when sitting up. She has been fatigued. Subsequently presents for evaluation.  Remainder of history limited 2/2 pt's illness/acuity.       Physical Exam   Triage Vital Signs: ED Triage Vitals  Enc Vitals Group     BP 06/24/22 1244 (!) 69/39     Pulse Rate 06/24/22 1244 100     Resp 06/24/22 1244 18     Temp 06/24/22 1244 97.7 F (36.5 C)     Temp Source 06/24/22 1244 Oral     SpO2 06/24/22 1244 (!) 86 %     Weight 06/24/22 1248 135 lb (61.2 kg)     Height --      Head Circumference --      Peak Flow --      Pain Score 06/24/22 1248 0     Pain Loc --      Pain Edu? --      Excl. in GC? --     Most recent vital signs: Vitals:   06/24/22 1420 06/24/22 1500  BP: (!) 86/59 (!) 80/51  Pulse:  97  Resp: 16 15  Temp:    SpO2: 98% 98%     General: Awake, ill-appearing. CV:  Tachycardic. Poor distal perfusion. Regular rate, no murmurs. Resp:  Normal work of breathing. Lungs clear. Abd:  No distention. Minimal diffuse tenderness without rebound or guarding. Other:  Significant erythema with raw, tender skin along buttocks/perianal area.   ED Results / Procedures / Treatments   Labs (all labs ordered are listed, but only abnormal results are displayed) Labs Reviewed  COMPREHENSIVE METABOLIC PANEL - Abnormal; Notable for the following components:      Result Value   Sodium 129 (*)    Potassium 2.6 (*)    Chloride 91 (*)    CO2 17 (*)    Glucose, Bld 122 (*)     BUN 71 (*)    Creatinine, Ser 7.19 (*)    Calcium 7.5 (*)    Total Protein 9.0 (*)    Albumin 3.2 (*)    AST 13 (*)    Alkaline Phosphatase 169 (*)    GFR, Estimated 6 (*)    Anion gap 21 (*)    All other components within normal limits  LACTIC ACID, PLASMA - Abnormal; Notable for the following components:   Lactic Acid, Venous 2.1 (*)    All other components within normal limits  CBC WITH DIFFERENTIAL/PLATELET - Abnormal; Notable for the following components:   WBC 14.5 (*)    RBC 3.76 (*)    Hemoglobin 9.3 (*)    HCT 30.4 (*)    MCH 24.7 (*)    RDW 24.4 (*)    Neutro Abs 12.5 (*)    Abs Immature Granulocytes 0.13 (*)    All other components within normal limits  PROTIME-INR - Abnormal; Notable for the following components:   Prothrombin Time 15.7 (*)    All other components within normal limits  CULTURE, BLOOD (ROUTINE X 2)  CULTURE, BLOOD (ROUTINE X 2)  RESP PANEL BY RT-PCR (RSV, FLU A&B, COVID)  RVPGX2  C DIFFICILE QUICK SCREEN W PCR REFLEX    GASTROINTESTINAL PANEL BY PCR, STOOL (REPLACES STOOL CULTURE)  LACTIC ACID, PLASMA  URINALYSIS, ROUTINE W REFLEX MICROSCOPIC     EKG Normal sinus rhythm, VR 97. PR 150, QRS 132, QTc 535. No acute st elevations or depressions. No ischemia or infarct.   RADIOLOGY CXR: Clear, no acute process   I also independently reviewed and agree with radiologist interpretations.   PROCEDURES:  Critical Care performed: Yes  .Critical Care  Performed by: Shaune Pollack, MD Authorized by: Shaune Pollack, MD   Critical care provider statement:    Critical care time (minutes):  30   Critical care time was exclusive of:  Separately billable procedures and treating other patients   Critical care was necessary to treat or prevent imminent or life-threatening deterioration of the following conditions:  Cardiac failure, circulatory failure, respiratory failure and sepsis   Critical care was time spent personally by me on the following  activities:  Development of treatment plan with patient or surrogate, discussions with consultants, evaluation of patient's response to treatment, examination of patient, ordering and review of laboratory studies, ordering and review of radiographic studies, ordering and performing treatments and interventions, pulse oximetry, re-evaluation of patient's condition and review of old charts     MEDICATIONS ORDERED IN ED: Medications  lactated ringers infusion (has no administration in time range)  metroNIDAZOLE (FLAGYL) IVPB 500 mg (has no administration in time range)  vancomycin (VANCOCIN) IVPB 1000 mg/200 mL premix (has no administration in time range)  midodrine (PROAMATINE) tablet 10 mg (has no administration in time range)  barrier cream (non-specified) 1 Application (1 Application Topical Given 06/24/22 1417)  potassium chloride 10 mEq in 100 mL IVPB (has no administration in time range)  lactated ringers bolus 1,000 mL (1,000 mLs Intravenous New Bag/Given 06/24/22 1418)    And  lactated ringers bolus 1,000 mL (0 mLs Intravenous Stopped 06/24/22 1417)  ceFEPIme (MAXIPIME) 2 g in sodium chloride 0.9 % 100 mL IVPB (2 g Intravenous New Bag/Given 06/24/22 1423)     IMPRESSION / MDM / ASSESSMENT AND PLAN / ED COURSE  I reviewed the triage vital signs and the nursing notes.                              Differential diagnosis includes, but is not limited to, sepsis 2/2 colitis, diverticulitis, C. Diff, UTI, excretory diarrhea, CHF, ACS, severe anemia  Patient's presentation is most consistent with acute presentation with potential threat to life or bodily function.  The patient is on the cardiac monitor to evaluate for evidence of arrhythmia and/or significant heart rate changes  72 yo F here with nausea, vomiting, diarrhea, fatigue. Pt arrives hypotensive, tachycardic, ill-appearing with concern for severe hypovolemia and possible sepsis.  Lab work shows significnat leukoctyosis, lactic  acidosis, and profound AKI which I suspect is mostly from GI losses and hypovolemia. CT A/P obtained and shows no major abnormalities. CXR is clear. UA pending.  Plan to tx for sepsis and profound hypovolemia, though consideration of possible C. Diff/other coiltis - stool studies have been ordered. IVF has been ordered as boluses. Will give PO midodrine and reassess after fluid, admit to ICU vs hospitalist depending on response.   FINAL CLINICAL IMPRESSION(S) / ED DIAGNOSES   Final diagnoses:  AKI (acute kidney injury) (HCC)  Hypovolemia  Lactic acidosis  Hypokalemia     Rx / DC Orders   ED Discharge Orders     None        Note:  This document was prepared using Dragon voice recognition software and may include unintentional dictation errors.   Shaune Pollack, MD 06/24/22 [phone]

## 2022-06-24 NOTE — Consult Note (Signed)
PHARMACY -  BRIEF ANTIBIOTIC NOTE   Pharmacy has received consult(s) for cefepime and vancomycin from an ED provider. Patient is also ordered metronidazole. The patient's profile has been reviewed for ht/wt/allergies/indication/available labs.    One time order(s) placed for  --Cefepime 2 g IV --Vancomycin 1 g IV  Further antibiotics/pharmacy consults should be ordered by admitting physician if indicated.                       Thank you, Tressie Ellis 06/24/2022  1:36 PM

## 2022-06-24 NOTE — ED Triage Notes (Signed)
Pt arrives via EMS from home due to diarrhea since Wednesday. Pt is A/Ox1 at this time.

## 2022-06-25 ENCOUNTER — Encounter: Payer: Self-pay | Admitting: Internal Medicine

## 2022-06-25 ENCOUNTER — Other Ambulatory Visit: Payer: Self-pay

## 2022-06-25 DIAGNOSIS — N179 Acute kidney failure, unspecified: Secondary | ICD-10-CM | POA: Diagnosis not present

## 2022-06-25 LAB — BASIC METABOLIC PANEL
Anion gap: 13 (ref 5–15)
Anion gap: 14 (ref 5–15)
BUN: 64 mg/dL — ABNORMAL HIGH (ref 8–23)
BUN: 57 mg/dL — ABNORMAL HIGH (ref 8–23)
CO2: 15 mmol/L — ABNORMAL LOW (ref 22–32)
CO2: 13 mmol/L — ABNORMAL LOW (ref 22–32)
Calcium: 6.6 mg/dL — ABNORMAL LOW (ref 8.9–10.3)
Calcium: 7.4 mg/dL — ABNORMAL LOW (ref 8.9–10.3)
Chloride: 100 mmol/L (ref 98–111)
Chloride: 102 mmol/L (ref 98–111)
Creatinine, Ser: 5.74 mg/dL — ABNORMAL HIGH (ref 0.44–1.00)
Creatinine, Ser: 5.14 mg/dL — ABNORMAL HIGH (ref 0.44–1.00)
GFR, Estimated: 7 mL/min — ABNORMAL LOW (ref >60)
GFR, Estimated: 8 mL/min — ABNORMAL LOW (ref >60)
Glucose, Bld: 74 mg/dL (ref 70–99)
Glucose, Bld: 94 mg/dL (ref 70–99)
Potassium: 3.5 mmol/L (ref 3.5–5.1)
Potassium: 3 mmol/L — ABNORMAL LOW (ref 3.5–5.1)
Sodium: 128 mmol/L — ABNORMAL LOW (ref 135–145)
Sodium: 129 mmol/L — ABNORMAL LOW (ref 135–145)

## 2022-06-25 LAB — GASTROINTESTINAL PANEL BY PCR, STOOL (REPLACES STOOL CULTURE)

## 2022-06-25 LAB — CULTURE, BLOOD (ROUTINE X 2): Culture: NO GROWTH

## 2022-06-25 LAB — TYPE AND SCREEN: Antibody Screen: NEGATIVE

## 2022-06-25 LAB — CBC
HCT: 20.8 % — ABNORMAL LOW (ref 36.0–46.0)
Hemoglobin: 6.3 g/dL — ABNORMAL LOW (ref 12.0–15.0)
MCH: 25.2 pg — ABNORMAL LOW (ref 26.0–34.0)
MCHC: 30.3 g/dL (ref 30.0–36.0)
MCV: 83.2 fL (ref 80.0–100.0)
Platelets: 315 10*3/uL (ref 150–400)
RBC: 2.5 MIL/uL — ABNORMAL LOW (ref 3.87–5.11)
RDW: 23.6 % — ABNORMAL HIGH (ref 11.5–15.5)
WBC: 13.2 10*3/uL — ABNORMAL HIGH (ref 4.0–10.5)
nRBC: 0 % (ref 0.0–0.2)

## 2022-06-25 LAB — HEMOGLOBIN AND HEMATOCRIT, BLOOD
HCT: 20.4 % — ABNORMAL LOW (ref 36.0–46.0)
Hemoglobin: 6.3 g/dL — ABNORMAL LOW (ref 12.0–15.0)

## 2022-06-25 LAB — PREPARE RBC (CROSSMATCH)

## 2022-06-25 LAB — T4, FREE: Free T4: 1.55 ng/dL — ABNORMAL HIGH (ref 0.61–1.12)

## 2022-06-25 LAB — IRON AND TIBC
Iron: 97 ug/dL (ref 28–170)
Saturation Ratios: 50 % — ABNORMAL HIGH (ref 10.4–31.8)
TIBC: 193 ug/dL — ABNORMAL LOW (ref 250–450)
UIBC: 96 ug/dL

## 2022-06-25 LAB — PHOSPHORUS: Phosphorus: 4.1 mg/dL (ref 2.5–4.6)

## 2022-06-25 LAB — CBG MONITORING, ED
Glucose-Capillary: 57 mg/dL — ABNORMAL LOW (ref 70–99)
Glucose-Capillary: 73 mg/dL (ref 70–99)
Glucose-Capillary: 79 mg/dL (ref 70–99)
Glucose-Capillary: 81 mg/dL (ref 70–99)

## 2022-06-25 LAB — BPAM RBC: Unit Type and Rh: 5100

## 2022-06-25 LAB — VITAMIN D 25 HYDROXY (VIT D DEFICIENCY, FRACTURES): Vit D, 25-Hydroxy: 105.56 ng/mL — ABNORMAL HIGH (ref 30–100)

## 2022-06-25 LAB — HEMOGLOBIN A1C
Hgb A1c MFr Bld: 5.9 % — ABNORMAL HIGH (ref 4.8–5.6)
Mean Plasma Glucose: 122.63 mg/dL

## 2022-06-25 LAB — VITAMIN B12: Vitamin B-12: 2491 pg/mL — ABNORMAL HIGH (ref 180–914)

## 2022-06-25 LAB — FERRITIN: Ferritin: 442 ng/mL — ABNORMAL HIGH (ref 11–307)

## 2022-06-25 LAB — CORTISOL: Cortisol, Plasma: 26.7 ug/dL

## 2022-06-25 MED ORDER — METOPROLOL TARTRATE 5 MG/5ML IV SOLN: 5.0000 mg | INTRAVENOUS | Status: DC | PRN

## 2022-06-25 MED ORDER — POTASSIUM CHLORIDE CRYS ER 20 MEQ PO TBCR
20.0000 meq | EXTENDED_RELEASE_TABLET | Freq: Once | ORAL | Status: AC
  Administered 2022-06-25: 20 meq via ORAL
  Filled 2022-06-25: qty 1

## 2022-06-25 MED ORDER — ATORVASTATIN CALCIUM 20 MG PO TABS
80.0000 mg | ORAL_TABLET | Freq: Every day | ORAL | Status: DC
  Administered 2022-06-25 – 2022-07-06 (×11): 80 mg via ORAL
  Filled 2022-06-25: qty 4
  Filled 2022-06-25: qty 1
  Filled 2022-06-25 (×7): qty 4
  Filled 2022-06-25: qty 1
  Filled 2022-06-25 (×2): qty 4

## 2022-06-25 MED ORDER — HYDRALAZINE HCL 20 MG/ML IJ SOLN: 10.0000 mg | INTRAMUSCULAR | Status: DC | PRN

## 2022-06-25 MED ORDER — INSULIN ASPART 100 UNIT/ML IJ SOLN: 0.0000 [IU] | INTRAMUSCULAR | Status: DC

## 2022-06-25 MED ORDER — LOPERAMIDE HCL 2 MG PO CAPS
4.0000 mg | ORAL_CAPSULE | ORAL | Status: DC | PRN
  Administered 2022-06-25 – 2022-06-30 (×6): 4 mg via ORAL
  Filled 2022-06-25 (×6): qty 2

## 2022-06-25 MED ORDER — SODIUM CHLORIDE 0.9% IV SOLUTION
Freq: Once | INTRAVENOUS | Status: AC
  Filled 2022-06-25: qty 250

## 2022-06-25 MED ORDER — GLUCAGON HCL RDNA (DIAGNOSTIC) 1 MG IJ SOLR: 1.0000 mg | INTRAMUSCULAR | Status: DC | PRN

## 2022-06-25 MED ORDER — MAGNESIUM SULFATE 2 GM/50ML IV SOLN
2.0000 g | Freq: Once | INTRAVENOUS | Status: AC
  Administered 2022-06-25: 2 g via INTRAVENOUS
  Filled 2022-06-25: qty 50

## 2022-06-25 MED ORDER — POTASSIUM CHLORIDE 10 MEQ/100ML IV SOLN: 10.0000 meq | Freq: Once | INTRAVENOUS | Status: DC

## 2022-06-25 MED ORDER — GUAIFENESIN 100 MG/5ML PO LIQD: 5.0000 mL | ORAL | Status: DC | PRN

## 2022-06-25 MED ORDER — POTASSIUM CHLORIDE 10 MEQ/100ML IV SOLN
10.0000 meq | INTRAVENOUS | Status: AC
  Administered 2022-06-25 (×4): 10 meq via INTRAVENOUS
  Filled 2022-06-25 (×4): qty 100

## 2022-06-25 MED ORDER — SENNOSIDES-DOCUSATE SODIUM 8.6-50 MG PO TABS: 1.0000 | ORAL_TABLET | Freq: Every evening | ORAL | Status: DC | PRN

## 2022-06-25 MED ORDER — DEXTROSE-SODIUM CHLORIDE 5-0.9 % IV SOLN: INTRAVENOUS | Status: DC

## 2022-06-25 MED ORDER — SODIUM CHLORIDE 0.9 % IV SOLN
1.0000 g | INTRAVENOUS | Status: AC
  Administered 2022-06-25 – 2022-06-29 (×5): 1 g via INTRAVENOUS
  Filled 2022-06-25 (×5): qty 10

## 2022-06-25 MED ORDER — DEXTROSE 50 % IV SOLN
1.0000 | INTRAVENOUS | Status: DC | PRN
  Administered 2022-06-28: 50 mL via INTRAVENOUS
  Filled 2022-06-25: qty 50

## 2022-06-25 MED ORDER — CALCIUM GLUCONATE-NACL 2-0.675 GM/100ML-% IV SOLN
2.0000 g | Freq: Once | INTRAVENOUS | Status: AC
  Administered 2022-06-25: 2000 mg via INTRAVENOUS
  Filled 2022-06-25: qty 100

## 2022-06-25 MED ORDER — IPRATROPIUM-ALBUTEROL 0.5-2.5 (3) MG/3ML IN SOLN: 3.0000 mL | RESPIRATORY_TRACT | Status: DC | PRN

## 2022-06-25 MED ORDER — ONDANSETRON HCL 4 MG/2ML IJ SOLN
4.0000 mg | Freq: Four times a day (QID) | INTRAMUSCULAR | Status: DC | PRN
  Administered 2022-06-25 – 2022-07-01 (×3): 4 mg via INTRAVENOUS
  Filled 2022-06-25 (×5): qty 2

## 2022-06-25 MED ORDER — SODIUM BICARBONATE 650 MG PO TABS
650.0000 mg | ORAL_TABLET | Freq: Three times a day (TID) | ORAL | Status: AC
  Administered 2022-06-25 – 2022-06-27 (×9): 650 mg via ORAL
  Filled 2022-06-25 (×10): qty 1

## 2022-06-25 MED ORDER — DEXTROSE 50 % IV SOLN
1.0000 | Freq: Once | INTRAVENOUS | Status: AC
  Administered 2022-06-25: 50 mL via INTRAVENOUS
  Filled 2022-06-25: qty 50

## 2022-06-25 MED ORDER — SODIUM CHLORIDE 0.9 % IV BOLUS
500.0000 mL | Freq: Once | INTRAVENOUS | Status: AC
  Administered 2022-06-25: 500 mL via INTRAVENOUS

## 2022-06-25 MED ORDER — NYSTATIN 100000 UNIT/GM EX CREA
TOPICAL_CREAM | Freq: Two times a day (BID) | CUTANEOUS | Status: DC
  Filled 2022-06-25 (×2): qty 30

## 2022-06-25 MED ORDER — SODIUM CHLORIDE 0.9 % IV SOLN: INTRAVENOUS | Status: DC

## 2022-06-25 NOTE — Consult Note (Signed)
PHARMACY CONSULT NOTE - FOLLOW UP  Pharmacy Consult for Electrolyte Monitoring and Replacement   Recent Labs: Potassium (mmol/L)  Date Value  06/25/2022 3.5  10/13/2013 3.0 (L)   Magnesium (mg/dL)  Date Value  29/56/2130 1.6 (L)  03/09/2013 2.3   Calcium (mg/dL)  Date Value  86/57/8469 6.6 (L)   Calcium, Total (mg/dL)  Date Value  62/95/2841 9.3   Albumin (g/dL)  Date Value  32/44/0102 3.2 (L)  10/12/2020 3.9  09/15/2013 3.3 (L)   Phosphorus (mg/dL)  Date Value  72/53/6644 3.3   Sodium  Date Value  06/25/2022 128 mmol/L (L)  07/18/2020 CANCELED  10/07/2013 142 mmol/L   Corrected Calcium 7.2  Assessment: 72 yo female presented to the ED with diarrhea and weakness.  Patient admitted for treatment of AKI, diarrhea and electrolyte abnormalities.  Pharmacy consulted for electrolyte replacement.  Goal of Therapy:  WNL  Plan:  K+ 3.5, order Kcl 20 mEq po x 1 Magnesium 1.6, order MagSulf 2g IV x 1  Calcium gluconate 2 g IV x 1 ordered by provider Follow-up BMP, magnesium and phosphorus on 06/26/22  Barrie Folk ,PharmD Clinical Pharmacist 06/25/2022 9:12 AM

## 2022-06-25 NOTE — ED Notes (Signed)
Pt roused during changing. States she is nauseated. Denied being hungry and wanting something to eat. States buttocks very painful. Discussed the importance of trying to stop her diarrhea in order to heal her skin. She was understanding. Contacted attending to discuss skin breakdown- looks like yeast component.

## 2022-06-25 NOTE — Progress Notes (Signed)
PROGRESS NOTE    NOMIE VIRGILIO  GNF:[id number] DOB: May 29, 1950 DOA: 06/24/2022 PCP: [name], MD   Brief Narrative:  72 year old with history of systolic CHF EF 50%, COPD, chronic hypoxia on oxygen, PAH, dementia, HTN, hypothyroidism, orthostatic hypotension on midodrine, NSVT with ICD in place, normocytic anemia, DM2, history of syncope has been having ongoing diarrhea for about 18 days now feeling fatigue.  Upon admission she was noted to be hypotensive requiring IV fluids and midodrine.  She was noted to be significantly clinically dehydrated with hypokalemia and severe acute kidney injury with metabolic acidosis.  CT scan did not show any acute pathology.  Renal ultrasound was negative.   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 Cr 7.19 > 5.74 Suspect this is secondary to dehydration from ongoing subacute diarrhea.  Currently no IV fluids running but will go and order normal saline at 100 cc an hour.  Urine electrolytes showing some evidence of dehydration.  Will place patient on bicarb. -CT scan and renal ultrasound-negative. -Monitor urine output. Place foley.    Subacute Diarrhea Leukocytosis Mild lactic  acidosis CT scan did not suggest any evidence of active infection or colitis.  Received antibiotics in the ED.  C. difficile studies negative. Will order GI Panel.   Urinary tract infection - Secondary to severe dehydration.  In the setting of AKI, will opt to treated empirically with IV Rocephin. -Urine cultures not sent.  Patient is already received antibiotics.   Hypokalemia/hyponatremia/hypocalcemia Will replete electrolytes as needed.  Also check phosphorus levels.  Anemia - Unknown exact etiology.  No obvious signs of blood loss.  Will repeat H&H, if low will likely require transfusion. -Check iron studies, B12, folate   Paraprotein gap SPEP has been ordered by admitting provider.  Can follow-up results.   Acute metabolic encephalopathy Combination of issues including probably underlying infection, dehydration, uremia.   Orthostatic hypotension Currently on midodrine  Hypoglycemia - From poor oral intake.  Currently on D5 NS. Will cont accuchecks q4hrs. Given 1 amp of D50, additionally will give a dose of glucagon.  Check cortisol   Chronic congestive heart failure (HCC) 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 NSVT (nonsustained ventricular tachycardia) (HCC) -Currently dehydrated.  Closely monitor.    Chronic obstructive pulmonary disease, unspecified COPD type (HCC) Chronic respiratory failure with hypoxia (HCC) -Bronchodilators.  On scheduled Breo    Type 2 diabetes mellitus with hypoglycemia, unspecified whether long term insulin use (HCC) -A1c is 5.9.  Currently low blood glucose.  Closely monitor    Dementia with behavioral disturbance (HCC) -Continue donepezil and Namenda    Seizures (HCC) - Continue home keppra    Hypothyroidism -On home Synthroid.  Low TSH.  Will check free T4.    Multiple Home meds on Hold, will resume when appropriate. I  have reviewed them.   DVT prophylaxis: SQ Heparin Code Status: Full Code Family  Communication:   Status is: Inpatient Multiple ongoing medical issues.  Not stable for discharge       Diet Orders (From admission, onward)     Start     Ordered   06/24/22 1946  Diet clear liquid Room service appropriate? Yes; Fluid consistency: Thin  Diet effective now       Question Answer Comment  Room service appropriate? Yes   Fluid consistency: Thin      06/24/22 1945            Subjective: Seen at bedside.  Feels very weak.  Reporting of some lower back pain which has been chronic for her.   Examination:  General exam: Appears calm and comfortable, generally ill and frail appearing.  Clinically dry Respiratory system: Clear to auscultation. Respiratory effort normal. Cardiovascular system: S1 & S2 heard, RRR. No JVD, murmurs, rubs, gallops or clicks. No pedal edema. Gastrointestinal system: Abdomen is nondistended, soft and nontender. No organomegaly or masses felt. Normal bowel sounds heard. Central nervous system: Alert and oriented. No focal neurological deficits. Extremities: Symmetric 5 x 5 power. Skin: No rashes, lesions or ulcers Psychiatry: Judgement and insight appear normal. Mood & affect appropriate.  Objective: Vitals:   06/25/22 0200 06/25/22 0230 06/25/22 0300 06/25/22 0745  BP: 107/64 104/62 100/71 (!) 105/51  Pulse:    72  Resp: 17 14 13  (!) 23  Temp:    98 F (36.7 C)  TempSrc:      SpO2:    97%  Weight:        Intake/Output Summary (Last 24 hours) at 06/25/2022 0853 Last data filed at 06/25/2022 0416 Gross per 24 hour  Intake 1300 ml  Output --  Net 1300 ml   Filed Weights   06/24/22 1248  Weight: 61.2 kg    Scheduled Meds:  clopidogrel  75 mg Oral Daily   donepezil  10 mg Oral QHS   [START ON 06/26/2022] fluticasone furoate-vilanterol  1 puff Inhalation Daily   folic acid  1 mg Oral Daily   heparin injection (subcutaneous)  5,000 Units Subcutaneous Q8H   insulin aspart  0-9 Units Subcutaneous TID WC   levETIRAcetam  750 mg  Oral BID   levothyroxine  100 mcg Oral Q0600   memantine  10 mg Oral Daily   midodrine  10 mg Oral TID WC   mirtazapine  7.5 mg Oral BID   sodium chloride flush  3 mL Intravenous Q12H   Continuous Infusions:  Nutritional status     Body mass index is 23.91 kg/m.  Data Reviewed:   CBC: Recent Labs  Lab 06/24/22 1318 06/24/22 2204 06/25/22 0415  WBC 14.5* 13.8* 13.2*  NEUTROABS 12.5*  --   --   HGB 9.3* 6.1* 6.3*  HCT 30.4* 19.4* 20.8*  MCV 80.9 79.2* 83.2  PLT PLATELET CLUMPS NOTED ON SMEAR, UNABLE TO ESTIMATE 330 315   Basic Metabolic Panel: Recent Labs  Lab 06/24/22 1318 06/24/22 2204 06/25/22 0415  NA 129* 128* 128*  K 2.6* 2.7* 3.5  CL 91* 96* 100  CO2 17* 15* 15*  GLUCOSE 122* 74 74  BUN 71* 59* 64*  CREATININE 7.19* 5.61* 5.74*  CALCIUM 7.5* 6.6* 6.6*  MG 1.6*  --   --    GFR: Estimated Creatinine Clearance: 7.2 mL/min (A) (by C-G formula based on SCr of 5.74 mg/dL (H)). Liver Function Tests: Recent Labs  Lab 06/24/22 1318  AST 13*  ALT <5  ALKPHOS 169*  BILITOT 0.5  PROT 9.0*  ALBUMIN 3.2*   No results for input(s): "LIPASE", "AMYLASE" in the last 168 hours. No results for input(s): "AMMONIA" in the last 168 hours. Coagulation Profile: Recent Labs  Lab 06/24/22 1318  INR 1.2   Cardiac Enzymes: No results for input(s): "CKTOTAL", "CKMB", "CKMBINDEX", "TROPONINI" in the last 168 hours. BNP (last 3 results) No results for input(s): "PROBNP" in the last 8760 hours. HbA1C: Recent Labs    06/24/22 2204  HGBA1C 5.9*   CBG: Recent Labs  Lab 06/25/22 0734  GLUCAP 57*   Lipid Profile: No results for input(s): "CHOL", "HDL", "LDLCALC", "TRIG", "CHOLHDL", "LDLDIRECT" in the last 72 hours. Thyroid Function Tests: Recent Labs    06/24/22 2204  TSH 0.204*   Anemia Panel: No results for input(s): "VITAMINB12", "FOLATE", "FERRITIN", "TIBC", "IRON", "RETICCTPCT" in the last 72 hours. Sepsis Labs: Recent Labs  Lab 06/24/22 1318  06/24/22 1328  LATICACIDVEN 2.1* 2.0*    Recent Results (from the past 240 hour(s))  Culture, blood (Routine x 2)     Status: None (Preliminary result)   Collection Time: 06/24/22  1:18 PM   Specimen: BLOOD  Result Value Ref Range Status   Specimen Description BLOOD BLOOD RIGHT ARM  Final   Special Requests   Final    BOTTLES DRAWN AEROBIC AND ANAEROBIC Blood Culture adequate volume   Culture   Final    NO GROWTH < 24 HOURS Performed at [facility], [address] [place], [place] [address]    Report Status PENDING  Incomplete  Culture, blood (Routine x 2)     Status: None (Preliminary result)   Collection Time: 06/24/22  1:28 PM   Specimen: BLOOD RIGHT ARM  Result Value Ref Range Status   Specimen Description BLOOD RIGHT ARM  Final   Special Requests   Final    BOTTLES DRAWN AEROBIC AND ANAEROBIC Blood Culture adequate volume   Culture   Final    NO GROWTH < 24 HOURS Performed at [facility], [address], [place], [place] [address]    Report Status PENDING  Incomplete  Resp panel by RT-PCR (RSV, Flu A&B, Covid) STOOL     Status: None   Collection Time: 06/24/22  1:28 PM   Specimen: STOOL; Nasal Swab  Result Value Ref Range Status   SARS Coronavirus 2 by RT PCR NEGATIVE NEGATIVE Final    Comment: (NOTE) SARS-CoV-2 target nucleic acids are NOT DETECTED.  The SARS-CoV-2 RNA is generally detectable in upper respiratory specimens during the acute phase of infection. The lowest concentration of SARS-CoV-2 viral copies this assay can detect is 138 copies/mL. A negative result does not preclude SARS-Cov-2 infection and should not be used as the sole basis for treatment or other patient management decisions. A negative result may occur with  improper specimen collection/handling, submission of specimen other than nasopharyngeal swab, presence of viral mutation(s) within the areas targeted by this assay, and inadequate number of viral copies(<138  copies/mL). A negative result must be combined with clinical observations, patient history, and epidemiological information. The expected result is Negative.  Fact Sheet for Patients:  BloggerCourse.com  Fact Sheet for Healthcare Providers:  SeriousBroker.it  This test is no t yet approved or cleared by the [place] FDA and  has been authorized for detection and/or diagnosis of SARS-CoV-2 by FDA under an Emergency Use Authorization (EUA). This EUA will remain  in  effect (meaning this test can be used) for the duration of the COVID-19 declaration under Section 564(b)(1) of the Act, 21 U.S.C.section 360bbb-3(b)(1), unless the authorization is terminated  or revoked sooner.       Influenza A by PCR NEGATIVE NEGATIVE Final   Influenza B by PCR NEGATIVE NEGATIVE Final    Comment: (NOTE) The Xpert Xpress SARS-CoV-2/FLU/RSV plus assay is intended as an aid in the diagnosis of influenza from Nasopharyngeal swab specimens and should not be used as a sole basis for treatment. Nasal washings and aspirates are unacceptable for Xpert Xpress SARS-CoV-2/FLU/RSV testing.  Fact Sheet for Patients: BloggerCourse.com  Fact Sheet for Healthcare Providers: SeriousBroker.it  This test is not yet approved or cleared by the [place] FDA and has been authorized for detection and/or diagnosis of SARS-CoV-2 by FDA under an Emergency Use Authorization (EUA). This EUA will remain in effect (meaning this test can be used) for the duration of the COVID-19 declaration under Section 564(b)(1) of the Act, 21 U.S.C. section 360bbb-3(b)(1), unless the authorization is terminated or revoked.     Resp Syncytial Virus by PCR NEGATIVE NEGATIVE Final    Comment: (NOTE) Fact Sheet for Patients: BloggerCourse.com  Fact Sheet for Healthcare  Providers: SeriousBroker.it  This test is not yet approved or cleared by the [place] FDA and has been authorized for detection and/or diagnosis of SARS-CoV-2 by FDA under an Emergency Use Authorization (EUA). This EUA will remain in effect (meaning this test can be used) for the duration of the COVID-19 declaration under Section 564(b)(1) of the Act, 21 U.S.C. section 360bbb-3(b)(1), unless the authorization is terminated or revoked.  Performed at [facility], [address] [place], [place] [address]   C Difficile Quick Screen w PCR reflex     Status: None   Collection Time: 06/24/22  6:30 PM   Specimen: STOOL  Result Value Ref Range Status   C Diff antigen NEGATIVE NEGATIVE Final   C Diff toxin NEGATIVE NEGATIVE Final   C Diff interpretation No C. difficile detected.  Final    Comment: Performed at [facility], [address], [place], [place] [address]         Radiology Studies: US RENAL  Result Date: 06/24/2022 CLINICAL DATA:  Acute kidney injury EXAM: RENAL / URINARY TRACT ULTRASOUND COMPLETE COMPARISON:  CT 06/24/2022 FINDINGS: Right Kidney: Renal measurements: 8.5 x 4.9 x 4.8 cm = volume: 104.3 mL. Echogenicity within normal limits. No mass or hydronephrosis visualized. Left Kidney: Renal measurements: 10 x 5.1 x 4.8 cm = volume: 126.6 mL. Echogenicity within normal limits. No hydronephrosis. Small cyst at the midpole measuring 14 mm, no imaging follow-up is recommended. Bladder: Appears normal for degree of bladder distention. Other: None. IMPRESSION: Essentially negative renal ultrasound. Electronically Signed   By: Jasmine Pang M.D.   On: 06/24/2022 21:44   CT ABDOMEN PELVIS WO CONTRAST  Result Date: 06/24/2022 CLINICAL DATA:  Acute abdominal pain, diarrhea EXAM: CT ABDOMEN AND PELVIS WITHOUT CONTRAST TECHNIQUE: Multidetector CT imaging of the abdomen and pelvis was performed following the standard protocol without IV  contrast. RADIATION DOSE REDUCTION: This exam was performed according to the departmental dose-optimization program which includes automated exposure control, adjustment of the mA and/or kV according to patient size and/or use of iterative reconstruction technique. COMPARISON:  CT 04/11/2022 FINDINGS: Lower chest: Transvenous pacing lead to the RV apex. No pleural or pericardial effusion. Hepatobiliary: No focal liver abnormality is seen. Status post cholecystectomy. No biliary dilatation. Pancreas: Unremarkable.  No pancreatic ductal dilatation or surrounding inflammatory changes. Spleen: Normal in size without focal abnormality. Adrenals/Urinary Tract: No adrenal mass. Symmetric renal contours without urolithiasis or hydronephrosis. Urinary bladder nondistended. Stomach/Bowel: Stomach is incompletely distended, unremarkable. Small bowel decompressed. Appendix not discretely identified. The colon is incompletely distended, with a few descending diverticula. Anastomotic staple line in the proximal sigmoid segment. No acute findings. Vascular/Lymphatic: Scattered aortoiliac calcified atheromatous plaque without aneurysm. No abdominal or pelvic adenopathy. Reproductive: Status post hysterectomy. No adnexal masses. Other: Bilateral pelvic phleboliths.  No ascites.  No free air. Musculoskeletal: Stable moderate L1 vertebral compression deformity. Multilevel lumbar spondylitic change as before. Fixation hardware across right femoral IT fracture. No acute findings. IMPRESSION: 1. No acute findings. 2. Descending colon diverticulosis. 3. Stable L1 vertebral compression deformity. 4.  Aortic Atherosclerosis (ICD10-I70.0). Electronically Signed   By: Corlis Leak M.D.   On: 06/24/2022 14:54   DG Chest Port 1 View  Result Date: 06/24/2022 CLINICAL DATA:  Questionable sepsis, evaluate for abnormality. EXAM: PORTABLE CHEST 1 VIEW COMPARISON:  Radiographs 06/05/2022 and 04/11/2022.  CT 09/21/2009. FINDINGS: 1340 hours. Two  views submitted. Unchanged left subclavian AICD with leads projecting to the right atrium, right ventricle and coronary sinus. The heart size and mediastinal contours are stable. The lungs are clear. There is no pleural effusion or pneumothorax. No acute osseous findings are evident. Telemetry leads overlie the chest. IMPRESSION: No evidence of acute cardiopulmonary process. Stable appearance of the AICD. Electronically Signed   By: Carey Bullocks M.D.   On: 06/24/2022 13:54           LOS: 1 day   Time spent= 35 mins    Davari Lopes Joline Maxcy, MD [facility]  If 7PM-7AM, please contact night-coverage  06/25/2022, 8:53 AM

## 2022-06-25 NOTE — ED Notes (Addendum)
Pt changed- small amount of seedy stool. Entire buttocks excoriated. Barrier cream applied.

## 2022-06-26 ENCOUNTER — Encounter: Payer: Self-pay | Admitting: Internal Medicine

## 2022-06-26 DIAGNOSIS — N179 Acute kidney failure, unspecified: Secondary | ICD-10-CM | POA: Diagnosis not present

## 2022-06-26 LAB — CBC
HCT: 25.7 % — ABNORMAL LOW (ref 36.0–46.0)
Hemoglobin: 8.1 g/dL — ABNORMAL LOW (ref 12.0–15.0)
MCH: 27.3 pg (ref 26.0–34.0)
MCHC: 31.5 g/dL (ref 30.0–36.0)
MCV: 86.5 fL (ref 80.0–100.0)
Platelets: 292 10*3/uL (ref 150–400)
RBC: 2.97 MIL/uL — ABNORMAL LOW (ref 3.87–5.11)
RDW: 21.7 % — ABNORMAL HIGH (ref 11.5–15.5)
WBC: 12 10*3/uL — ABNORMAL HIGH (ref 4.0–10.5)
nRBC: 0.3 % — ABNORMAL HIGH (ref 0.0–0.2)

## 2022-06-26 LAB — BPAM RBC
Blood Product Expiration Date: 202406262359
ISSUE DATE / TIME: 202405211228

## 2022-06-26 LAB — KAPPA/LAMBDA LIGHT CHAINS
Kappa free light chain: 175.7 mg/L — ABNORMAL HIGH (ref 3.3–19.4)
Kappa, lambda light chain ratio: 1.57 (ref 0.26–1.65)
Lambda free light chains: 111.9 mg/L — ABNORMAL HIGH (ref 5.7–26.3)

## 2022-06-26 LAB — GLUCOSE, CAPILLARY
Glucose-Capillary: 78 mg/dL (ref 70–99)
Glucose-Capillary: 84 mg/dL (ref 70–99)
Glucose-Capillary: 86 mg/dL (ref 70–99)

## 2022-06-26 LAB — BASIC METABOLIC PANEL
Anion gap: 9 (ref 5–15)
BUN: 50 mg/dL — ABNORMAL HIGH (ref 8–23)
CO2: 14 mmol/L — ABNORMAL LOW (ref 22–32)
Calcium: 7.2 mg/dL — ABNORMAL LOW (ref 8.9–10.3)
Chloride: 112 mmol/L — ABNORMAL HIGH (ref 98–111)
Creatinine, Ser: 3.89 mg/dL — ABNORMAL HIGH (ref 0.44–1.00)
GFR, Estimated: 12 mL/min — ABNORMAL LOW (ref >60)
Glucose, Bld: 91 mg/dL (ref 70–99)
Potassium: 3.9 mmol/L (ref 3.5–5.1)
Sodium: 135 mmol/L (ref 135–145)

## 2022-06-26 LAB — TYPE AND SCREEN
ABO/RH(D): O POS
Unit division: 0

## 2022-06-26 LAB — CBG MONITORING, ED
Glucose-Capillary: 76 mg/dL (ref 70–99)
Glucose-Capillary: 82 mg/dL (ref 70–99)
Glucose-Capillary: 89 mg/dL (ref 70–99)

## 2022-06-26 LAB — FOLATE: Folate: 31 ng/mL (ref >5.9)

## 2022-06-26 LAB — MAGNESIUM: Magnesium: 1.7 mg/dL (ref 1.7–2.4)

## 2022-06-26 MED ORDER — DEXTROSE-SODIUM CHLORIDE 5-0.45 % IV SOLN: INTRAVENOUS | Status: DC

## 2022-06-26 MED ORDER — MAGNESIUM SULFATE 2 GM/50ML IV SOLN
2.0000 g | Freq: Once | INTRAVENOUS | Status: AC
  Administered 2022-06-26: 2 g via INTRAVENOUS
  Filled 2022-06-26: qty 50

## 2022-06-26 MED ORDER — ZINC OXIDE 40 % EX OINT
TOPICAL_OINTMENT | Freq: Two times a day (BID) | CUTANEOUS | Status: DC
  Administered 2022-06-29: 1 via TOPICAL
  Filled 2022-06-26 (×3): qty 113

## 2022-06-26 MED ORDER — LEVOTHYROXINE SODIUM 88 MCG PO TABS
88.0000 ug | ORAL_TABLET | Freq: Every day | ORAL | Status: DC
  Administered 2022-06-27 – 2022-07-06 (×10): 88 ug via ORAL
  Filled 2022-06-26 (×10): qty 1

## 2022-06-26 MED ORDER — CHLORHEXIDINE GLUCONATE CLOTH 2 % EX PADS
6.0000 | MEDICATED_PAD | Freq: Every day | CUTANEOUS | Status: DC
  Administered 2022-06-26 – 2022-07-06 (×11): 6 via TOPICAL

## 2022-06-26 NOTE — Consult Note (Signed)
PHARMACY CONSULT NOTE  Pharmacy Consult for Electrolyte Monitoring and Replacement   Recent Labs: Potassium (mmol/L)  Date Value  06/26/2022 3.9  10/13/2013 3.0 (L)   Magnesium (mg/dL)  Date Value  16/11/9602 1.7  03/09/2013 2.3   Calcium (mg/dL)  Date Value  54/10/8117 7.2 (L)   Calcium, Total (mg/dL)  Date Value  14/78/2956 9.3   Albumin (g/dL)  Date Value  21/30/8657 3.2 (L)  10/12/2020 3.9  09/15/2013 3.3 (L)   Phosphorus (mg/dL)  Date Value  84/69/6295 4.1   Sodium  Date Value  06/26/2022 135 mmol/L  07/18/2020 CANCELED  10/07/2013 142 mmol/L   Assessment: 72 yo female presented to the ED with diarrhea and weakness.  Patient admitted for treatment of AKI, diarrhea and electrolyte abnormalities.  Pharmacy consulted for electrolyte replacement.  AKI resolving (Scr 7.19 >> 3.89)  MIVF: D51/2 NS at 75 cc/hr  Goal of Therapy:  Within normal limits  Plan:  --Mg 1.7, will give magnesium sulfate 2 g IV x 1 --Follow-up electrolytes with AM labs tomorrow  Tressie Ellis 06/26/2022 12:12 PM

## 2022-06-26 NOTE — ED Notes (Signed)
Pt had a large seedy stool, cleaned and sheets and gown changed.

## 2022-06-26 NOTE — ED Notes (Signed)
Pt's brief checked- no diarrhea- reports she feels like her skin feels better around buttocks.

## 2022-06-26 NOTE — Evaluation (Signed)
Physical Therapy Evaluation Patient Details Name: Stacy Grimes MRN: [id number] DOB: Jun 29, 1950 Today's Date: 06/26/2022  History of Present Illness  Pt is a 72 year old female with history of stroke, dementia, COPD on chronic 4 L nasal cannula,  orthostatic hypotension, cardiomyopathy, seizure disorder, PE, and severe PAH s/p AICD who presents to the emergency department for chief concerns of AKI, 18 days of diarrhea, decreased PO, low BP, dehydration.   Clinical Impression  Pt alert, agreeable to PT with some encouragement. Pt stated her husband helps her with all ADLs, including transfers and ambulation. Normally ambulatory with a RW.   Supine to sidelying and sidelying to sitting with modA. Poor sitting balance noted, required BUE support. Sit <> stand with RW and maxAx2, RN in room to switch out beds during mobility. Pt fatigued very quickly. Complained of both SOB and nausea, BP soft RN aware. Returned to sidelying with needs in reach due to skin maceration of buttocks.  Overall the patient demonstrated deficits (see "PT Problem List") that impede the patient's functional abilities, safety, and mobility and would benefit from skilled PT intervention. Recommendation is to continued skilled PT services to maximize safety and mobility.         Recommendations for follow up therapy are one component of a multi-disciplinary discharge planning process, led by the attending physician.  Recommendations may be updated based on patient status, additional functional criteria and insurance authorization.  Follow Up Recommendations Can patient physically be transported by private vehicle: No     Assistance Recommended at Discharge Frequent or constant Supervision/Assistance  Patient can return home with the following  Two people to help with walking and/or transfers;Two people to help with bathing/dressing/bathroom;Assistance with cooking/housework;Direct supervision/assist for medications  management;Help with stairs or ramp for entrance;Assist for transportation    Equipment Recommendations Other (comment) (TBD at next venue of care)  Recommendations for Other Services       Functional Status Assessment Patient has had a recent decline in their functional status and demonstrates the ability to make significant improvements in function in a reasonable and predictable amount of time.     Precautions / Restrictions Precautions Precautions: Fall Restrictions Weight Bearing Restrictions: No      Mobility  Bed Mobility Overal bed mobility: Needs Assistance Bed Mobility: Sidelying to Sit   Sidelying to sit: Mod assist            Transfers Overall transfer level: Needs assistance Equipment used: Rolling walker (2 wheels) Transfers: Sit to/from Stand Sit to Stand: Max assist, +2 physical assistance           General transfer comment: Pt MAX A x2 to stand statically at RW (pt with posterior lean) for approx 30 seconds while nursing staff moved stretcher from behind patient and replaced it with hospital bed. Pt leaning forward at hips and requiring MAX A x2 to maintain upright position in standing.    Ambulation/Gait               General Gait Details: unable  Stairs            Wheelchair Mobility    Modified Rankin (Stroke Patients Only)       Balance Overall balance assessment: Needs assistance Sitting-balance support: Feet unsupported Sitting balance-Leahy Scale: Poor     Standing balance support: Bilateral upper extremity supported, Reliant on assistive device for balance Standing balance-Leahy Scale: Zero  Pertinent Vitals/Pain Pain Assessment Pain Assessment: Faces Faces Pain Scale: Hurts even more Pain Location: abdomen, peri-anal area (very red), legs Pain Descriptors / Indicators: Aching, Burning, Guarding, Grimacing, Moaning Pain Intervention(s): Limited activity within patient's  tolerance, Monitored during session, Repositioned    Home Living Family/patient expects to be discharged to:: Private residence Living Arrangements: Spouse/significant other Available Help at Discharge: Family;Available 24 hours/day Type of Home: Mobile home Home Access: Ramped entrance       Home Layout: One level Home Equipment: Rolling Walker (2 wheels);BSC/3in1;Rollator (4 wheels);Cane - single point Additional Comments: History from chart review, no family at bedside. Medical record states spouse and grandson helping pt at home.    Prior Function Prior Level of Function : Needs assist             Mobility Comments: pt reported her husband helps her to stand and walk ADLs Comments: Pt stating assist with all ADLs; no family available to corroborate.     Hand Dominance   Dominant Hand: Right    Extremity/Trunk Assessment   Upper Extremity Assessment Upper Extremity Assessment: Generalized weakness    Lower Extremity Assessment Lower Extremity Assessment: Generalized weakness       Communication   Communication: HOH  Cognition                                                General Comments General comments (skin integrity, edema, etc.): Pt on 4L O2 throughout session. Pt complains on and off of abdominal pain; BP low (90's/60's) upon testing in standing/seated- pt unable to tolerate fully standing BP.    Exercises     Assessment/Plan    PT Assessment Patient needs continued PT services  PT Problem List Decreased mobility;Decreased strength;Decreased activity tolerance;Decreased balance;Decreased knowledge of use of DME       PT Treatment Interventions DME instruction;Therapeutic activities;Therapeutic exercise;Gait training;Patient/family education;Stair training;Balance training;Neuromuscular re-education;Functional mobility training    PT Goals (Current goals can be found in the Care Plan section)  Acute Rehab PT Goals Patient  Stated Goal: to go home PT Goal Formulation: With patient Time For Goal Achievement: 07/10/22 Potential to Achieve Goals: Fair    Frequency Min 2X/week     Co-evaluation PT/OT/SLP Co-Evaluation/Treatment: Yes Reason for Co-Treatment: Complexity of the patient's impairments (multi-system involvement);For patient/therapist safety;To address functional/ADL transfers PT goals addressed during session: Mobility/safety with mobility;Proper use of DME OT goals addressed during session: Strengthening/ROM       AM-PAC PT "6 Clicks" Mobility  Outcome Measure Help needed turning from your back to your side while in a flat bed without using bedrails?: A Lot Help needed moving from lying on your back to sitting on the side of a flat bed without using bedrails?: A Lot Help needed moving to and from a bed to a chair (including a wheelchair)?: Total Help needed standing up from a chair using your arms (e.g., wheelchair or bedside chair)?: Total Help needed to walk in hospital room?: Total Help needed climbing 3-5 steps with a railing? : Total 6 Click Score: 8    End of Session Equipment Utilized During Treatment: Gait belt Activity Tolerance: Patient limited by fatigue;Patient limited by pain Patient left: in bed;with call bell/phone within reach;with bed alarm set Nurse Communication: Mobility status PT Visit Diagnosis: Other abnormalities of gait and mobility (R26.89);Muscle weakness (generalized) (M62.81);Difficulty in walking, not  elsewhere classified (R26.2)    Time: 1010-1031 PT Time Calculation (min) (ACUTE ONLY): 21 min   Charges:   PT Evaluation $PT Eval Low Complexity: 1 Low          Olga Coaster PT, DPT 1:06 PM,06/26/22

## 2022-06-26 NOTE — ED Notes (Signed)
PT/OT stood patient up briefly.  She was unable to walk at all and stood with much assist.  We were able to get her into a hospital bed.  Dry diaper was removed, peri care.  Is excoriated peri anal and buttocks.  Skin over sacrum is intact.  Placed mirapex buttocks dressing.  Complains of nausea on and off throughout.  Denies pain, but then says her bottom burns.  Laying on right side.  Call bell in reach.

## 2022-06-26 NOTE — Progress Notes (Signed)
PROGRESS NOTE    Stacy Grimes  ZOX:[id number] DOB: Oct 01, 1950 DOA: 06/24/2022 PCP: [name], MD   Brief Narrative:  72 year old with history of systolic CHF EF 50%, COPD, chronic hypoxia on oxygen, PAH, dementia, HTN, hypothyroidism, orthostatic hypotension on midodrine, NSVT with ICD in place, normocytic anemia, DM2, history of syncope has been having ongoing diarrhea for about 18 days now feeling fatigue.  Upon admission she was noted to be hypotensive requiring IV fluids and midodrine.  She was noted to be significantly clinically dehydrated with hypokalemia and severe acute kidney injury with metabolic acidosis.  CT scan did not show any acute pathology.  Renal ultrasound was negative.   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 Cr 7.19 > 5.74 > 5.14 > 3.89 Change fluids to D5 1/2 NS. Cont Na Bicarb -CT scan and renal ultrasound-negative. -Monitor urine output. Foley placed on 5/21   Subacute Diarrhea; resolved.  Leukocytosis Mild lactic acidosis CT scan did not suggest any evidence of active infection or colitis.  Received antibiotics in the ED. GI Panel & C. difficile studies negative. Imodium prn.    Urinary tract infection - Secondary to severe dehydration.  In the setting of AKI, will opt to treated empirically with IV Rocephin. -Urine cultures not sent.  Patient was already received antibiotics.   Hypokalemia/hyponatremia/hypocalcemia Replete prn.   Anemia - Unknown exact etiology.  No obvious signs of blood loss.  Hb 8.1 s/p 1U PRBC -Iron studies, B12 = ok   Paraprotein gap SPEP has been ordered by admitting provider.  Can follow-up results.   Acute metabolic encephalopathy; improved Combination of issues including probably underlying infection, dehydration, uremia. Adv diet as tolerated to Full today.    Orthostatic hypotension Currently on midodrine  Hypoglycemia - From poor oral intake.  On D5 1/2 NS. Prn D50 and Glucagon ordered.    Chronic congestive heart failure (HCC) 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 NSVT (nonsustained ventricular tachycardia) (HCC) -Currently dehydrated.  Closely monitor.    Chronic obstructive pulmonary disease, unspecified COPD type (HCC) Chronic respiratory failure with hypoxia (HCC) -Bronchodilators.  On scheduled Breo    Type 2 diabetes mellitus with hypoglycemia, unspecified whether long term insulin use (HCC) -A1c is 5.9.  Currently low blood glucose.  Closely monitor    Dementia with behavioral disturbance (HCC) -Continue donepezil and Namenda    Seizures (HCC) - Continue home keppra    Hypothyroidism -Low TSH, slightly high ft4. Reduce Synthroid . Repeat levels in 4 weeks outpatient    Multiple Home meds on Hold, will resume when appropriate. I have reviewed them.   DVT prophylaxis: SQ Heparin Code Status: Full Code Family Communication:   Status is: Inpatient Multiple ongoing medical issues.  Not stable for discharge       Diet Orders (From  admission, onward)     Start     Ordered   06/24/22 1946  Diet clear liquid Room service appropriate? Yes; Fluid consistency: Thin  Diet  effective now       Question Answer Comment  Room service appropriate? Yes   Fluid consistency: Thin      06/24/22 1945            Subjective: Feels little better,  Diarrhea improved.  Still has slightly dark urine in foley.   Examination:  Constitutional: Not in acute distress. Clinically dry Respiratory: Clear to auscultation bilaterally Cardiovascular: Normal sinus rhythm, no rubs Abdomen: Nontender nondistended good bowel sounds Musculoskeletal: No edema noted Skin: No rashes seen Neurologic: CN 2-12 grossly intact.  And nonfocal Psychiatric: Normal judgment and insight. Alert and oriented x 3. Normal mood.   Foley in place.  Objective: Vitals:   06/26/22 0030 06/26/22 0330 06/26/22 0537 06/26/22 0630  BP: (!) 94/56 (!) 98/49  97/62  Pulse: 73 75  73  Resp: 13 10  19   Temp:   97.9 F (36.6 C)   TempSrc:   Oral   SpO2: 100% 100%  100%  Weight:        Intake/Output Summary (Last 24 hours) at 06/26/2022 0843 Last data filed at 06/25/2022 1851 Gross per 24 hour  Intake 1530 ml  Output 250 ml  Net 1280 ml   Filed Weights   06/24/22 1248  Weight: 61.2 kg    Scheduled Meds:  atorvastatin  80 mg Oral Daily   clopidogrel  75 mg Oral Daily   fluticasone furoate-vilanterol  1 puff Inhalation Daily   folic acid  1 mg Oral Daily   heparin injection (subcutaneous)  5,000 Units Subcutaneous Q8H   insulin aspart  0-9 Units Subcutaneous Q4H   levETIRAcetam  750 mg Oral BID   levothyroxine  100 mcg Oral Q0600   midodrine  10 mg Oral TID WC   mirtazapine  7.5 mg Oral BID   nystatin cream   Topical BID   sodium bicarbonate  650 mg Oral TID   sodium chloride flush  3 mL Intravenous Q12H   Continuous Infusions:  cefTRIAXone (ROCEPHIN)  IV Stopped (06/25/22 1422)   dextrose 5 % and 0.9 % NaCl 100 mL/hr at 06/25/22 1116    Nutritional status     Body mass index is 23.91 kg/m.  Data Reviewed:   CBC: Recent Labs  Lab 06/24/22 1318 06/24/22 2204  06/25/22 0415 06/25/22 0919  WBC 14.5* 13.8* 13.2*  --   NEUTROABS 12.5*  --   --   --   HGB 9.3* 6.1* 6.3* 6.3*  HCT 30.4* 19.4* 20.8* 20.4*  MCV 80.9 79.2* 83.2  --   PLT PLATELET CLUMPS NOTED ON SMEAR, UNABLE TO ESTIMATE 330 315  --    Basic Metabolic Panel: Recent Labs  Lab 06/24/22 1318 06/24/22 2204 06/25/22 0415 06/25/22 0919 06/25/22 1427  NA 129* 128* 128*  --  129*  K 2.6* 2.7* 3.5  --  3.0*  CL 91* 96* 100  --  102  CO2 17* 15* 15*  --  13*  GLUCOSE 122* 74 74  --  94  BUN 71* 59* 64*  --  57*  CREATININE 7.19* 5.61* 5.74*  --  5.14*  CALCIUM 7.5* 6.6* 6.6*  --  7.4*  MG 1.6*  --   --   --   --   PHOS  --   --   --  4.1  --  GFR: Estimated Creatinine Clearance: 8.1 mL/min (A) (by C-G formula based on SCr of 5.14 mg/dL (H)). Liver Function Tests: Recent Labs  Lab 06/24/22 1318  AST 13*  ALT <5  ALKPHOS 169*  BILITOT 0.5  PROT 9.0*  ALBUMIN 3.2*   No results for input(s): "LIPASE", "AMYLASE" in the last 168 hours. No results for input(s): "AMMONIA" in the last 168 hours. Coagulation Profile: Recent Labs  Lab 06/24/22 1318  INR 1.2   Cardiac Enzymes: No results for input(s): "CKTOTAL", "CKMB", "CKMBINDEX", "TROPONINI" in the last 168 hours. BNP (last 3 results) No results for input(s): "PROBNP" in the last 8760 hours. HbA1C: Recent Labs    06/24/22 2204  HGBA1C 5.9*   CBG: Recent Labs  Lab 06/25/22 1131 06/25/22 1612 06/25/22 2008 06/25/22 2357 06/26/22 0737  GLUCAP 73 79 81 89 76   Lipid Profile: No results for input(s): "CHOL", "HDL", "LDLCALC", "TRIG", "CHOLHDL", "LDLDIRECT" in the last 72 hours. Thyroid Function Tests: Recent Labs    06/24/22 2204 06/25/22 0919  TSH 0.204*  --   FREET4  --  1.55*   Anemia Panel: Recent Labs    06/25/22 0919  VITAMINB12 2,491*  FERRITIN 442*  TIBC 193*  IRON 97   Sepsis Labs: Recent Labs  Lab 06/24/22 1318 06/24/22 1328  LATICACIDVEN 2.1* 2.0*    Recent Results (from the  past 240 hour(s))  Culture, blood (Routine x 2)     Status: None (Preliminary result)   Collection Time: 06/24/22  1:18 PM   Specimen: BLOOD  Result Value Ref Range Status   Specimen Description BLOOD BLOOD RIGHT ARM  Final   Special Requests   Final    BOTTLES DRAWN AEROBIC AND ANAEROBIC Blood Culture adequate volume   Culture   Final    NO GROWTH 2 DAYS Performed at [facility], [address], [place], [place] [address]    Report Status PENDING  Incomplete  Culture, blood (Routine x 2)     Status: None (Preliminary result)   Collection Time: 06/24/22  1:28 PM   Specimen: BLOOD RIGHT ARM  Result Value Ref Range Status   Specimen Description BLOOD RIGHT ARM  Final   Special Requests   Final    BOTTLES DRAWN AEROBIC AND ANAEROBIC Blood Culture adequate volume   Culture   Final    NO GROWTH 2 DAYS Performed at [facility], [address], [place], [place] [address]    Report Status PENDING  Incomplete  Resp panel by RT-PCR (RSV, Flu A&B, Covid) STOOL     Status: None   Collection Time: 06/24/22  1:28 PM   Specimen: STOOL; Nasal Swab  Result Value Ref Range Status   SARS Coronavirus 2 by RT PCR NEGATIVE NEGATIVE Final    Comment: (NOTE) SARS-CoV-2 target nucleic acids are NOT DETECTED.  The SARS-CoV-2 RNA is generally detectable in upper respiratory specimens during the acute phase of infection. The lowest concentration of SARS-CoV-2 viral copies this assay can detect is 138 copies/mL. A negative result does not preclude SARS-Cov-2 infection and should not be used as the sole basis for treatment or other patient management decisions. A negative result may occur with  improper specimen collection/handling, submission of specimen other than nasopharyngeal swab, presence of viral mutation(s) within the areas targeted by this assay, and inadequate number of viral copies(<138 copies/mL). A negative result must be combined with clinical observations, patient  history, and epidemiological information. The expected result is Negative.  Fact Sheet for Patients:  BloggerCourse.com  Fact Sheet for Healthcare Providers:  SeriousBroker.it  This test is no t yet approved or cleared by the [place] FDA and  has been authorized for detection and/or diagnosis of SARS-CoV-2 by FDA under an Emergency Use Authorization (EUA). This EUA will remain  in effect (meaning this test can be used) for the duration of the COVID-19 declaration under Section 564(b)(1) of the Act, 21 U.S.C.section 360bbb-3(b)(1), unless the authorization is terminated  or revoked sooner.       Influenza A by PCR NEGATIVE NEGATIVE Final   Influenza B by PCR NEGATIVE NEGATIVE Final    Comment: (NOTE) The Xpert Xpress SARS-CoV-2/FLU/RSV plus assay is intended as an aid in the diagnosis of influenza from Nasopharyngeal swab specimens and should not be used as a sole basis for treatment. Nasal washings and aspirates are unacceptable for Xpert Xpress SARS-CoV-2/FLU/RSV testing.  Fact Sheet for Patients: BloggerCourse.com  Fact Sheet for Healthcare Providers: SeriousBroker.it  This test is not yet approved or cleared by the [place] FDA and has been authorized for detection and/or diagnosis of SARS-CoV-2 by FDA under an Emergency Use Authorization (EUA). This EUA will remain in effect (meaning this test can be used) for the duration of the COVID-19 declaration under Section 564(b)(1) of the Act, 21 U.S.C. section 360bbb-3(b)(1), unless the authorization is terminated or revoked.     Resp Syncytial Virus by PCR NEGATIVE NEGATIVE Final    Comment: (NOTE) Fact Sheet for Patients: BloggerCourse.com  Fact Sheet for Healthcare Providers: SeriousBroker.it  This test is not yet approved or cleared by the [place] FDA  and has been authorized for detection and/or diagnosis of SARS-CoV-2 by FDA under an Emergency Use Authorization (EUA). This EUA will remain in effect (meaning this test can be used) for the duration of the COVID-19 declaration under Section 564(b)(1) of the Act, 21 U.S.C. section 360bbb-3(b)(1), unless the authorization is terminated or revoked.  Performed at [facility], [address] [place], [place] [address]   C Difficile Quick Screen w PCR reflex     Status: None   Collection Time: 06/24/22  6:30 PM   Specimen: STOOL  Result Value Ref Range Status   C Diff antigen NEGATIVE NEGATIVE Final   C Diff toxin NEGATIVE NEGATIVE Final   C Diff interpretation No C. difficile detected.  Final    Comment: Performed at [facility], [address] [place], [place] [address]  Gastrointestinal Panel by PCR , Stool     Status: None   Collection Time: 06/25/22 10:10 AM   Specimen: Stool  Result Value Ref Range Status   Campylobacter species NOT DETECTED NOT DETECTED Final   Plesimonas shigelloides NOT DETECTED NOT DETECTED Final   Salmonella species NOT DETECTED NOT DETECTED Final   Yersinia enterocolitica NOT DETECTED NOT DETECTED Final   Vibrio species NOT DETECTED NOT DETECTED Final   Vibrio cholerae NOT DETECTED NOT DETECTED Final   Enteroaggregative E coli (EAEC) NOT DETECTED NOT DETECTED Final   Enteropathogenic E coli (EPEC) NOT DETECTED NOT DETECTED Final   Enterotoxigenic E coli (ETEC) NOT DETECTED NOT DETECTED Final   Shiga like toxin producing E coli (STEC) NOT DETECTED NOT DETECTED Final   Shigella/Enteroinvasive E coli (EIEC) NOT DETECTED NOT DETECTED Final   Cryptosporidium NOT DETECTED NOT DETECTED Final   Cyclospora cayetanensis NOT DETECTED NOT DETECTED Final   Entamoeba histolytica NOT DETECTED NOT DETECTED Final   Giardia lamblia NOT DETECTED NOT DETECTED Final   Adenovirus F40/41 NOT  DETECTED NOT DETECTED Final   Astrovirus NOT DETECTED NOT  DETECTED Final   Norovirus GI/GII NOT DETECTED NOT DETECTED Final   Rotavirus A NOT DETECTED NOT DETECTED Final   Sapovirus (I, II, IV, and V) NOT DETECTED NOT DETECTED Final    Comment: Performed at [facility], [address] [place], [place] [address]         Radiology Studies: US RENAL  Result Date: 06/24/2022 CLINICAL DATA:  Acute kidney injury EXAM: RENAL / URINARY TRACT ULTRASOUND COMPLETE COMPARISON:  CT 06/24/2022 FINDINGS: Right Kidney: Renal measurements: 8.5 x 4.9 x 4.8 cm = volume: 104.3 mL. Echogenicity within normal limits. No mass or hydronephrosis visualized. Left Kidney: Renal measurements: 10 x 5.1 x 4.8 cm = volume: 126.6 mL. Echogenicity within normal limits. No hydronephrosis. Small cyst at the midpole measuring 14 mm, no imaging follow-up is recommended. Bladder: Appears normal for degree of bladder distention. Other: None. IMPRESSION: Essentially negative renal ultrasound. Electronically Signed   By: Jasmine Pang M.D.   On: 06/24/2022 21:44   CT ABDOMEN PELVIS WO CONTRAST  Result Date: 06/24/2022 CLINICAL DATA:  Acute abdominal pain, diarrhea EXAM: CT ABDOMEN AND PELVIS WITHOUT CONTRAST TECHNIQUE: Multidetector CT imaging of the abdomen and pelvis was performed following the standard protocol without IV contrast. RADIATION DOSE REDUCTION: This exam was performed according to the departmental dose-optimization program which includes automated exposure control, adjustment of the mA and/or kV according to patient size and/or use of iterative reconstruction technique. COMPARISON:  CT 04/11/2022 FINDINGS: Lower chest: Transvenous pacing lead to the RV apex. No pleural or pericardial effusion. Hepatobiliary: No focal liver abnormality is seen. Status post cholecystectomy. No biliary dilatation. Pancreas: Unremarkable. No pancreatic ductal dilatation or surrounding inflammatory changes. Spleen: Normal in size without focal abnormality. Adrenals/Urinary Tract: No  adrenal mass. Symmetric renal contours without urolithiasis or hydronephrosis. Urinary bladder nondistended. Stomach/Bowel: Stomach is incompletely distended, unremarkable. Small bowel decompressed. Appendix not discretely identified. The colon is incompletely distended, with a few descending diverticula. Anastomotic staple line in the proximal sigmoid segment. No acute findings. Vascular/Lymphatic: Scattered aortoiliac calcified atheromatous plaque without aneurysm. No abdominal or pelvic adenopathy. Reproductive: Status post hysterectomy. No adnexal masses. Other: Bilateral pelvic phleboliths.  No ascites.  No free air. Musculoskeletal: Stable moderate L1 vertebral compression deformity. Multilevel lumbar spondylitic change as before. Fixation hardware across right femoral IT fracture. No acute findings. IMPRESSION: 1. No acute findings. 2. Descending colon diverticulosis. 3. Stable L1 vertebral compression deformity. 4.  Aortic Atherosclerosis (ICD10-I70.0). Electronically Signed   By: Corlis Leak M.D.   On: 06/24/2022 14:54   DG Chest Port 1 View  Result Date: 06/24/2022 CLINICAL DATA:  Questionable sepsis, evaluate for abnormality. EXAM: PORTABLE CHEST 1 VIEW COMPARISON:  Radiographs 06/05/2022 and 04/11/2022.  CT 09/21/2009. FINDINGS: 1340 hours. Two views submitted. Unchanged left subclavian AICD with leads projecting to the right atrium, right ventricle and coronary sinus. The heart size and mediastinal contours are stable. The lungs are clear. There is no pleural effusion or pneumothorax. No acute osseous findings are evident. Telemetry leads overlie the chest. IMPRESSION: No evidence of acute cardiopulmonary process. Stable appearance of the AICD. Electronically Signed   By: Carey Bullocks M.D.   On: 06/24/2022 13:54           LOS: 2 days   Time spent= 35 mins    Lucero Auzenne Joline Maxcy, MD [facility]  If 7PM-7AM, please contact night-coverage  06/26/2022, 8:43 AM

## 2022-06-26 NOTE — Evaluation (Signed)
Occupational Therapy Evaluation Patient Details Name: Stacy Grimes MRN: [id number] DOB: 14-Apr-1950 Today's Date: 06/26/2022   History of Present Illness Pt is a 72 year old female with history of stroke, dementia, COPD on chronic 4 L nasal cannula,  cardiomyopathy, seizure disorder, PE, and severe PAH s/p AICD who presents to the emergency department for chief concerns of AKI, 18 days of diarrhea, decreased PO, low BP, dehydration.   Clinical Impression   Pt presents with weakness, abdominal pain, requires MAX A x2 to maintain static standing position at EOB for approx 30 seconds. Very deconditioned. Pt noted to have matted hair on top/back of head, indicating likely not sitting or walking very much at baseline (likely mostly in bed) and not doing daily grooming of hair.      Recommendations for follow up therapy are one component of a multi-disciplinary discharge planning process, led by the attending physician.  Recommendations may be updated based on patient status, additional functional criteria and insurance authorization.   Assistance Recommended at Discharge Frequent or constant Supervision/Assistance  Patient can return home with the following Two people to help with walking and/or transfers;A lot of help with bathing/dressing/bathroom;Assistance with cooking/housework;Direct supervision/assist for medications management;Direct supervision/assist for financial management;Assist for transportation;Help with stairs or ramp for entrance    Functional Status Assessment  Patient has had a recent decline in their functional status and demonstrates the ability to make significant improvements in function in a reasonable and predictable amount of time.  Equipment Recommendations  Other (comment) (defer to next venue of care)    Recommendations for Other Services       Precautions / Restrictions Precautions Precautions: Fall Restrictions Weight Bearing Restrictions: No       Mobility Bed Mobility Overal bed mobility: Needs Assistance Bed Mobility: Sidelying to Sit   Sidelying to sit: Mod assist       General bed mobility comments: Pt with increase in pain.    Transfers Overall transfer level: Needs assistance Equipment used: Rolling walker (2 wheels) Transfers: Sit to/from Stand Sit to Stand: Max assist, +2 physical assistance           General transfer comment: Pt MAX A x2 to stand statically at RW (pt with posterior lean) for approx 30 seconds while nursing staff moved stretcher from behind patient and replaced it with hospital bed. Pt leaning forward at hips and requiring MAX A x2 to maintain upright position in standing.      Balance Overall balance assessment: Needs assistance Sitting-balance support: Feet unsupported Sitting balance-Leahy Scale: Poor     Standing balance support: Bilateral upper extremity supported, Reliant on assistive device for balance Standing balance-Leahy Scale: Zero                             ADL either performed or assessed with clinical judgement   ADL Overall ADL's : Needs assistance/impaired                                       General ADL Comments: Pt required dependent assistance to doff brief in bed. Difficulty focusing on task (trying to remain standing) and self-limiting due to pain. ADLs not formally tested today, as pt needing to be assisted to transfer from stretcher to bed.     Vision Baseline Vision/History:  (unknown)       Perception  Praxis      Pertinent Vitals/Pain Pain Assessment Pain Assessment: 0-10 Pain Score:  (unable to state a number) Pain Location: abdomen, peri-anal area (very red) Pain Descriptors / Indicators: Aching, Burning, Guarding, Grimacing, Moaning Pain Intervention(s): Limited activity within patient's tolerance, Monitored during session, Repositioned     Hand Dominance Right   Extremity/Trunk Assessment Upper Extremity  Assessment Upper Extremity Assessment: Generalized weakness (difficulty holding onto RW to support self during standing)   Lower Extremity Assessment Lower Extremity Assessment: Defer to PT evaluation       Communication Communication Communication: HOH   Cognition Arousal/Alertness: Awake/alert Behavior During Therapy: Restless, Anxious Overall Cognitive Status: No family/caregiver present to determine baseline cognitive functioning                                 General Comments: Pt has hx of dementia; unable to provide many details.     General Comments  Pt on 4L O2 throughout session. Pt complains on and off of abdominal pain; BP low (90's/60's) upon testing in standing/seated- pt unable to tolerate fully standing BP.    Exercises     Shoulder Instructions      Home Living Family/patient expects to be discharged to:: Private residence Living Arrangements: Spouse/significant other Available Help at Discharge: Family;Available 24 hours/day Type of Home: Mobile home Home Access: Ramped entrance     Home Layout: One level     Bathroom Shower/Tub: Chief Strategy Officer: Handicapped height Bathroom Accessibility: Yes   Home Equipment: Agricultural consultant (2 wheels);BSC/3in1;Rollator (4 wheels);Cane - single point   Additional Comments: History from chart review, no family at bedside. Medical record states spouse and grandson helping pt at home.      Prior Functioning/Environment Prior Level of Function : Needs assist             Mobility Comments: No family to provide details. ADLs Comments: Pt stating assist with all ADLs; no family available to corroborate.        OT Problem List: Decreased strength;Decreased activity tolerance;Impaired balance (sitting and/or standing);Decreased cognition;Decreased safety awareness      OT Treatment/Interventions: Self-care/ADL training;Therapeutic exercise;Therapeutic activities;Patient/family  education    OT Goals(Current goals can be found in the care plan section) Acute Rehab OT Goals Patient Stated Goal: Pain to stop OT Goal Formulation: With patient Time For Goal Achievement: 07/10/22 Potential to Achieve Goals: Fair ADL Goals Pt Will Perform Grooming: with set-up;with supervision;sitting Pt Will Perform Upper Body Dressing: with supervision;sitting Pt Will Transfer to Toilet: with min assist;bedside commode  OT Frequency: Min 1X/week    Co-evaluation PT/OT/SLP Co-Evaluation/Treatment: Yes Reason for Co-Treatment: Complexity of the patient's impairments (multi-system involvement);For patient/therapist safety;To address functional/ADL transfers   OT goals addressed during session: Strengthening/ROM      AM-PAC OT "6 Clicks" Daily Activity     Outcome Measure Help from another person eating meals?: A Little Help from another person taking care of personal grooming?: A Little Help from another person toileting, which includes using toliet, bedpan, or urinal?: Total Help from another person bathing (including washing, rinsing, drying)?: Total Help from another person to put on and taking off regular upper body clothing?: A Little Help from another person to put on and taking off regular lower body clothing?: Total 6 Click Score: 12   End of Session Equipment Utilized During Treatment: Rolling walker (2 wheels);Gait belt Nurse Communication: Mobility status  Activity Tolerance: Patient  limited by pain;Patient limited by fatigue Patient left: in bed;with call bell/phone within reach;with bed alarm set (Pt in sidelying on R side)  OT Visit Diagnosis: Unsteadiness on feet (R26.81);Muscle weakness (generalized) (M62.81)                Time: 8413-2440 OT Time Calculation (min): 12 min Charges:  OT General Charges $OT Visit: 1 Visit OT Evaluation $OT Eval Moderate Complexity: 1 Mod  Leticia Coletta Junie Panning, MS, OTR/L  Alvester Morin 06/26/2022, 11:22 AM

## 2022-06-27 DIAGNOSIS — N179 Acute kidney failure, unspecified: Secondary | ICD-10-CM | POA: Diagnosis not present

## 2022-06-27 LAB — CBC
HCT: 24.7 % — ABNORMAL LOW (ref 36.0–46.0)
Hemoglobin: 8 g/dL — ABNORMAL LOW (ref 12.0–15.0)
MCH: 26.8 pg (ref 26.0–34.0)
MCHC: 32.4 g/dL (ref 30.0–36.0)
MCV: 82.9 fL (ref 80.0–100.0)
Platelets: 269 10*3/uL (ref 150–400)
RBC: 2.98 MIL/uL — ABNORMAL LOW (ref 3.87–5.11)
RDW: 21.7 % — ABNORMAL HIGH (ref 11.5–15.5)
WBC: 10.3 10*3/uL (ref 4.0–10.5)
nRBC: 0.4 % — ABNORMAL HIGH (ref 0.0–0.2)

## 2022-06-27 LAB — BASIC METABOLIC PANEL
Anion gap: 9 (ref 5–15)
BUN: 42 mg/dL — ABNORMAL HIGH (ref 8–23)
CO2: 16 mmol/L — ABNORMAL LOW (ref 22–32)
Calcium: 7.7 mg/dL — ABNORMAL LOW (ref 8.9–10.3)
Chloride: 114 mmol/L — ABNORMAL HIGH (ref 98–111)
Creatinine, Ser: 2.98 mg/dL — ABNORMAL HIGH (ref 0.44–1.00)
GFR, Estimated: 16 mL/min — ABNORMAL LOW (ref >60)
Glucose, Bld: 88 mg/dL (ref 70–99)
Potassium: 2.9 mmol/L — ABNORMAL LOW (ref 3.5–5.1)
Sodium: 139 mmol/L (ref 135–145)

## 2022-06-27 LAB — CALCIUM, IONIZED: Calcium, Ionized, Serum: 4 mg/dL — ABNORMAL LOW (ref 4.5–5.6)

## 2022-06-27 LAB — GLUCOSE, CAPILLARY
Glucose-Capillary: 72 mg/dL (ref 70–99)
Glucose-Capillary: 80 mg/dL (ref 70–99)
Glucose-Capillary: 81 mg/dL (ref 70–99)
Glucose-Capillary: 86 mg/dL (ref 70–99)
Glucose-Capillary: 90 mg/dL (ref 70–99)
Glucose-Capillary: 92 mg/dL (ref 70–99)

## 2022-06-27 LAB — CULTURE, BLOOD (ROUTINE X 2): Culture: NO GROWTH

## 2022-06-27 LAB — POTASSIUM: Potassium: 3.3 mmol/L — ABNORMAL LOW (ref 3.5–5.1)

## 2022-06-27 LAB — MAGNESIUM: Magnesium: 1.6 mg/dL — ABNORMAL LOW (ref 1.7–2.4)

## 2022-06-27 MED ORDER — POTASSIUM CHLORIDE CRYS ER 20 MEQ PO TBCR
40.0000 meq | EXTENDED_RELEASE_TABLET | Freq: Once | ORAL | Status: AC
  Administered 2022-06-27: 40 meq via ORAL
  Filled 2022-06-27: qty 2

## 2022-06-27 MED ORDER — DEXTROSE-SODIUM CHLORIDE 5-0.45 % IV SOLN: INTRAVENOUS | Status: DC

## 2022-06-27 MED ORDER — POTASSIUM CHLORIDE 10 MEQ/100ML IV SOLN
10.0000 meq | INTRAVENOUS | Status: AC
  Administered 2022-06-27 (×2): 10 meq via INTRAVENOUS
  Filled 2022-06-27 (×2): qty 100

## 2022-06-27 MED ORDER — MAGNESIUM SULFATE 2 GM/50ML IV SOLN
2.0000 g | Freq: Once | INTRAVENOUS | Status: AC
  Administered 2022-06-27: 2 g via INTRAVENOUS
  Filled 2022-06-27: qty 50

## 2022-06-27 NOTE — Progress Notes (Signed)
PROGRESS NOTE    Stacy Grimes  ZOX:[id number] DOB: 29-Mar-1950 DOA: 06/24/2022 PCP: [name], MD   Brief Narrative:  72 year old with history of systolic CHF EF 50%, COPD, chronic hypoxia on oxygen, PAH, dementia, HTN, hypothyroidism, orthostatic hypotension on midodrine, NSVT with ICD in place, normocytic anemia, DM2, history of syncope has been having ongoing diarrhea for about 18 days now feeling fatigue.  Upon admission she was noted to be hypotensive requiring IV fluids and midodrine.  She was noted to be significantly clinically dehydrated with hypokalemia and severe acute kidney injury with metabolic acidosis.  CT scan did not show any acute pathology.  Renal ultrasound was negative.  Renal function is slowly improving.   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 Cr 7.19 >>> 2.98 Continue D5 half-normal saline.  Sodium bicarb for 1 more day -CT scan and renal ultrasound-negative. -Monitor urine output. Foley placed on 5/21, as her mobility improves we will plan on removing this over next 24 hours   Subacute Diarrhea; resolved.  Leukocytosis Mild lactic acidosis CT scan did not suggest any evidence of  active infection or colitis.  Received antibiotics in the ED. GI Panel & C. difficile studies negative. Imodium prn.   Urinary tract infection - Secondary to severe dehydration.  In the setting of AKI, will opt to treated empirically with IV Rocephin.  Opt for total 5 days -Urine cultures not sent.  Patient was already received antibiotics.   Hypokalemia/hyponatremia/hypocalcemia Replete prn.    Paraprotein gap Elevated kappa/lambda chain.  Ratio is normal.  Consulted Hematology Dr Donneta Romberg   Anemia - Unknown exact etiology.  No obvious signs of blood loss.  Hb 8.0 s/p 1U PRBC -Iron studies, B12 = ok   Acute metabolic encephalopathy; improved Combination of issues including probably underlying infection, dehydration, uremia. Soft diet.    Orthostatic hypotension Currently on midodrine  Hypoglycemia - From poor oral intake.  On D5 1/2 NS. Prn D50 and Glucagon ordered.    Chronic congestive heart failure (HCC) 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 NSVT (nonsustained ventricular tachycardia) (HCC) -Currently dehydrated.  Closely monitor.    Chronic obstructive pulmonary disease, unspecified COPD type (HCC) Chronic respiratory failure with hypoxia (HCC) -Bronchodilators.  On scheduled Breo    Type 2 diabetes mellitus with hypoglycemia, unspecified whether long term insulin use (HCC) -A1c is 5.9.  Currently low blood glucose.  Closely monitor    Dementia with behavioral disturbance (HCC) -Continue donepezil and Namenda    Seizures (HCC) - Continue home keppra    Hypothyroidism -Low TSH, slightly high ft4. Reduce Synthroid . Repeat levels in 4 weeks outpatient    Multiple Home meds on Hold, will resume when appropriate. I have reviewed them.  PT/OT = SNF. TOC consulted.   DVT prophylaxis: SQ Heparin Code Status:  Full Code Family Communication:  Son updated.  Status is: Inpatient Multiple ongoing medical issues.  Not stable for  discharge   Pressure Injury 06/25/22 Buttocks Right;Left;Medial Stage 2 -  Partial thickness loss of dermis presenting as a shallow open injury with a red, pink wound bed without slough. (Active)  06/25/22 0423  Location: Buttocks  Location Orientation: Right;Left;Medial  Staging: Stage 2 -  Partial thickness loss of dermis presenting as a shallow open injury with a red, pink wound bed without slough.  Wound Description (Comments):   Present on Admission: Yes     Diet Orders (From admission, onward)     Start     Ordered   06/24/22 1946  Diet clear liquid Room service appropriate? Yes; Fluid consistency: Thin  Diet effective now       Question Answer Comment  Room service appropriate? Yes   Fluid consistency: Thin      06/24/22 1945            Subjective: Doing better, just tired.   Examination: Constitutional: Not in acute distress Respiratory: mild bibasilar crackles.  Cardiovascular: Normal sinus rhythm, no rubs Abdomen: Nontender nondistended good bowel sounds Musculoskeletal: No edema noted Skin: No rashes seen Neurologic: CN 2-12 grossly intact.  And nonfocal Psychiatric: Normal judgment and insight. Alert and oriented x 3. Normal mood.   Foley in place.  Objective: Vitals:   06/26/22 2038 06/26/22 2348 06/27/22 0408 06/27/22 0805  BP: 108/62 118/61 (!) 114/59 (!) 89/66  Pulse: 80 85 80 80  Resp: 16 18 18 18   Temp: 97.8 F (36.6 C) 98.4 F (36.9 C) 97.6 F (36.4 C) 97.9 F (36.6 C)  TempSrc:      SpO2: 100% 98% 100% 100%  Weight:        Intake/Output Summary (Last 24 hours) at 06/27/2022 0841 Last data filed at 06/27/2022 0700 Gross per 24 hour  Intake 1564.12 ml  Output 1700 ml  Net -135.88 ml   Filed Weights   06/24/22 1248  Weight: 61.2 kg    Scheduled Meds:  atorvastatin  80 mg Oral Daily   Chlorhexidine Gluconate Cloth  6 each Topical Daily   clopidogrel  75 mg Oral Daily   fluticasone furoate-vilanterol  1 puff Inhalation Daily    folic acid  1 mg Oral Daily   heparin injection (subcutaneous)  5,000 Units Subcutaneous Q8H   insulin aspart  0-9 Units Subcutaneous Q4H   levETIRAcetam  750 mg Oral BID   levothyroxine  88 mcg Oral Q0600   liver oil-zinc oxide   Topical BID   midodrine  10 mg Oral TID WC   mirtazapine  7.5 mg Oral BID   nystatin cream   Topical BID   potassium chloride  40 mEq Oral Once   sodium bicarbonate  650 mg Oral TID   sodium chloride flush  3 mL Intravenous Q12H   Continuous Infusions:  cefTRIAXone (ROCEPHIN)  IV Stopped (06/26/22 1640)   dextrose 5 % and 0.45 % NaCl     magnesium sulfate bolus IVPB     potassium chloride      Nutritional status     Body mass index is 23.91 kg/m.  Data Reviewed:   CBC: Recent Labs  Lab 06/24/22 1318 06/24/22 2204 06/25/22 0415 06/25/22 0919 06/26/22 0850 06/27/22 0631  WBC 14.5* 13.8* 13.2*  --  12.0* 10.3  NEUTROABS 12.5*  --   --   --   --   --   HGB 9.3*  6.1* 6.3* 6.3* 8.1* 8.0*  HCT 30.4* 19.4* 20.8* 20.4* 25.7* 24.7*  MCV 80.9 79.2* 83.2  --  86.5 82.9  PLT PLATELET CLUMPS NOTED ON SMEAR, UNABLE TO ESTIMATE 330 315  --  292 269   Basic Metabolic Panel: Recent Labs  Lab 06/24/22 1318 06/24/22 2204 06/25/22 0415 06/25/22 0919 06/25/22 1427 06/26/22 0850 06/27/22 0631  NA 129* 128* 128*  --  129* 135 139  K 2.6* 2.7* 3.5  --  3.0* 3.9 2.9*  CL 91* 96* 100  --  102 112* 114*  CO2 17* 15* 15*  --  13* 14* 16*  GLUCOSE 122* 74 74  --  94 91 88  BUN 71* 59* 64*  --  57* 50* 42*  CREATININE 7.19* 5.61* 5.74*  --  5.14* 3.89* 2.98*  CALCIUM 7.5* 6.6* 6.6*  --  7.4* 7.2* 7.7*  MG 1.6*  --   --   --   --  1.7 1.6*  PHOS  --   --   --  4.1  --   --   --    GFR: Estimated Creatinine Clearance: 13.9 mL/min (A) (by C-G formula based on SCr of 2.98 mg/dL (H)). Liver Function Tests: Recent Labs  Lab 06/24/22 1318  AST 13*  ALT <5  ALKPHOS 169*  BILITOT 0.5  PROT 9.0*  ALBUMIN 3.2*   No results for input(s): "LIPASE",  "AMYLASE" in the last 168 hours. No results for input(s): "AMMONIA" in the last 168 hours. Coagulation Profile: Recent Labs  Lab 06/24/22 1318  INR 1.2   Cardiac Enzymes: No results for input(s): "CKTOTAL", "CKMB", "CKMBINDEX", "TROPONINI" in the last 168 hours. BNP (last 3 results) No results for input(s): "PROBNP" in the last 8760 hours. HbA1C: Recent Labs    06/24/22 2204  HGBA1C 5.9*   CBG: Recent Labs  Lab 06/26/22 1800 06/26/22 2038 06/26/22 2349 06/27/22 0410 06/27/22 0800  GLUCAP 78 84 86 72 86   Lipid Profile: No results for input(s): "CHOL", "HDL", "LDLCALC", "TRIG", "CHOLHDL", "LDLDIRECT" in the last 72 hours. Thyroid Function Tests: Recent Labs    06/24/22 2204 06/25/22 0919  TSH 0.204*  --   FREET4  --  1.55*   Anemia Panel: Recent Labs    06/25/22 0919 06/26/22 0850  VITAMINB12 2,491*  --   FOLATE  --  31.0  FERRITIN 442*  --   TIBC 193*  --   IRON 97  --    Sepsis Labs: Recent Labs  Lab 06/24/22 1318 06/24/22 1328  LATICACIDVEN 2.1* 2.0*    Recent Results (from the past 240 hour(s))  Culture, blood (Routine x 2)     Status: None (Preliminary result)   Collection Time: 06/24/22  1:18 PM   Specimen: BLOOD  Result Value Ref Range Status   Specimen Description BLOOD BLOOD RIGHT ARM  Final   Special Requests   Final    BOTTLES DRAWN AEROBIC AND ANAEROBIC Blood Culture adequate volume   Culture   Final    NO GROWTH 3 DAYS Performed at [facility], [address] [place], [place] [address]    Report Status PENDING  Incomplete  Culture, blood (Routine x 2)     Status: None (Preliminary result)   Collection Time: 06/24/22  1:28 PM   Specimen: BLOOD RIGHT ARM  Result Value Ref Range Status   Specimen Description BLOOD RIGHT ARM  Final   Special Requests   Final    BOTTLES DRAWN AEROBIC AND ANAEROBIC  Blood Culture adequate volume   Culture   Final    NO GROWTH 3 DAYS Performed at [facility], [address] [place], [place] [address]    Report Status PENDING  Incomplete  Resp panel by RT-PCR (RSV, Flu A&B, Covid) STOOL     Status: None   Collection Time: 06/24/22  1:28 PM   Specimen: STOOL; Nasal Swab  Result Value Ref Range Status   SARS Coronavirus 2 by RT PCR NEGATIVE NEGATIVE Final    Comment: (NOTE) SARS-CoV-2 target nucleic acids are NOT DETECTED.  The SARS-CoV-2 RNA is generally detectable in upper respiratory specimens during the acute phase of infection. The lowest concentration of SARS-CoV-2 viral copies this assay can detect is 138 copies/mL. A negative result does not preclude SARS-Cov-2 infection and should not be used as the sole basis for treatment or other patient management decisions. A negative result may occur with  improper specimen collection/handling, submission of specimen other than nasopharyngeal swab, presence of viral mutation(s) within the areas targeted by this assay, and inadequate number of viral copies(<138 copies/mL). A negative result must be combined with clinical observations, patient history, and epidemiological information. The expected result is Negative.  Fact Sheet for Patients:  BloggerCourse.com  Fact Sheet for Healthcare Providers:  SeriousBroker.it  This test is no t yet approved or cleared by the [place] FDA and  has been authorized for detection and/or diagnosis of SARS-CoV-2 by FDA under an Emergency Use Authorization (EUA). This EUA will remain  in effect (meaning this test can be used) for the duration of the COVID-19 declaration under Section 564(b)(1) of the Act, 21 U.S.C.section 360bbb-3(b)(1), unless the authorization is terminated  or revoked sooner.       Influenza A by PCR NEGATIVE NEGATIVE Final   Influenza B by PCR NEGATIVE NEGATIVE Final    Comment: (NOTE) The Xpert Xpress SARS-CoV-2/FLU/RSV plus assay is intended as an aid in the diagnosis of influenza from  Nasopharyngeal swab specimens and should not be used as a sole basis for treatment. Nasal washings and aspirates are unacceptable for Xpert Xpress SARS-CoV-2/FLU/RSV testing.  Fact Sheet for Patients: BloggerCourse.com  Fact Sheet for Healthcare Providers: SeriousBroker.it  This test is not yet approved or cleared by the [place] FDA and has been authorized for detection and/or diagnosis of SARS-CoV-2 by FDA under an Emergency Use Authorization (EUA). This EUA will remain in effect (meaning this test can be used) for the duration of the COVID-19 declaration under Section 564(b)(1) of the Act, 21 U.S.C. section 360bbb-3(b)(1), unless the authorization is terminated or revoked.     Resp Syncytial Virus by PCR NEGATIVE NEGATIVE Final    Comment: (NOTE) Fact Sheet for Patients: BloggerCourse.com  Fact Sheet for Healthcare Providers: SeriousBroker.it  This test is not yet approved or cleared by the [place] FDA and has been authorized for detection and/or diagnosis of SARS-CoV-2 by FDA under an Emergency Use Authorization (EUA). This EUA will remain in effect (meaning this test can be used) for the duration of the COVID-19 declaration under Section 564(b)(1) of the Act, 21 U.S.C. section 360bbb-3(b)(1), unless the authorization is terminated or revoked.  Performed at [facility], [address] [place], [place] [address]   C Difficile Quick Screen w PCR reflex     Status: None   Collection Time: 06/24/22  6:30 PM   Specimen: STOOL  Result Value Ref Range Status   C Diff antigen NEGATIVE NEGATIVE Final   C Diff toxin  NEGATIVE NEGATIVE Final   C Diff interpretation No C. difficile detected.  Final    Comment: Performed at [facility], [address] [place], [place] [address]  Gastrointestinal Panel by PCR , Stool     Status: None   Collection  Time: 06/25/22 10:10 AM   Specimen: Stool  Result Value Ref Range Status   Campylobacter species NOT DETECTED NOT DETECTED Final   Plesimonas shigelloides NOT DETECTED NOT DETECTED Final   Salmonella species NOT DETECTED NOT DETECTED Final   Yersinia enterocolitica NOT DETECTED NOT DETECTED Final   Vibrio species NOT DETECTED NOT DETECTED Final   Vibrio cholerae NOT DETECTED NOT DETECTED Final   Enteroaggregative E coli (EAEC) NOT DETECTED NOT DETECTED Final   Enteropathogenic E coli (EPEC) NOT DETECTED NOT DETECTED Final   Enterotoxigenic E coli (ETEC) NOT DETECTED NOT DETECTED Final   Shiga like toxin producing E coli (STEC) NOT DETECTED NOT DETECTED Final   Shigella/Enteroinvasive E coli (EIEC) NOT DETECTED NOT DETECTED Final   Cryptosporidium NOT DETECTED NOT DETECTED Final   Cyclospora cayetanensis NOT DETECTED NOT DETECTED Final   Entamoeba histolytica NOT DETECTED NOT DETECTED Final   Giardia lamblia NOT DETECTED NOT DETECTED Final   Adenovirus F40/41 NOT DETECTED NOT DETECTED Final   Astrovirus NOT DETECTED NOT DETECTED Final   Norovirus GI/GII NOT DETECTED NOT DETECTED Final   Rotavirus A NOT DETECTED NOT DETECTED Final   Sapovirus (I, II, IV, and V) NOT DETECTED NOT DETECTED Final    Comment: Performed at [facility], [address] [place], [place] [address]         Radiology Studies: No results found.         LOS: 3 days   Time spent= 35 mins    Malaya Cagley Joline Maxcy, MD [facility]  If 7PM-7AM, please contact night-coverage  06/27/2022, 8:41 AM

## 2022-06-27 NOTE — Plan of Care (Signed)

## 2022-06-27 NOTE — Consult Note (Signed)
[facility] CONSULT NOTE  Patient Care Team: [name], MD as PCP - General (Family Medicine) Duke Salvia, MD as PCP - Electrophysiology (Cardiology) Iran Ouch, MD as PCP - Cardiology (Cardiology)  CHIEF COMPLAINTS/PURPOSE OF CONSULTATION: Acute renal failure/abnormal kappa lambda light chain ratio  HISTORY OF PRESENTING ILLNESS:  Stacy Grimes 72 y.o.  female multiple medical problems including congestive heart failure, COPD chronic hypoxia on oxygen pulmonary hypertension, dementia history of orthostatic hypotension midodrine NSVT status post ICD placement chronic anemia diabetes-is currently admitted to hospital for worsening diarrhea/renal failure.  Patient admission noted to have severe diarrhea with acute renal failure and metabolic acidosis with worsening fatigue.  Noted to have hypotensive needing IV fluids and midodrine.  Renal ultrasound negative.  Also noted to have anemia hemoglobin on 6 on admission.  Currently hemoglobin is 8.0.  White count platelets normal.  As part of workup patient was ordered kappa lambda light chain ratio which was abnormal.  Calcium was low.  Oncology has been consulted for evaluation.   Review of Systems  Unable to perform ROS: Other  Pt sleepy/drowsy; and poor historian.   MEDICAL HISTORY:  Past Medical History:  Diagnosis Date   AAA (abdominal aortic aneurysm) (HCC)    Anginal pain (HCC)    Anxiety    Arthritis    Automatic implantable cardioverter-defibrillator in situ    Biventricular ICD  [employer]    a.  2011 s/p SJM CRT-D; b. 09/2016 gen change: SJM BiV AICD (ser# [id number]).   COPD (chronic obstructive pulmonary disease) (HCC)    Deafness    left ear   Dementia (HCC)    Depression    Dizziness    Fracture    history of spinal fracture   GERD (gastroesophageal reflux disease)    HFrEF (heart failure with reduced ejection fraction) (HCC)    a. 2010 Cath: nonobs dzs, EF 15-20%; b. 09/2017 Echo: EF  suboptimal. EF low nl; c. 04/2018 Echo: EF 40-45%; d. 10/2021 Echo: EF 40-45%, glob HK, mildly red RV fxn, sev elev PASP, triv MR, mild-mod TR.   Hypertension    Hypothyroidism    Non-ischemic cardiomyopathy (HCC)    a. 2010 Cath: nonobs dzs, EF 15-20%; b. 2011 s/p SJM CRT-D; c. 09/2016 s/p Gen change; d. 09/2017 Echo: EF suboptimal. EF low nl; e. 04/2018 Echo: EF 40-45%; f. 10/2021 Echo: EF 40-45%, glob HK, mildly red RV fxn, sev elev PASP, triv MR, mild-mod TR.   Oxygen dependent    Palpitations    Sleep apnea     SURGICAL HISTORY: Past Surgical History:  Procedure Laterality Date   ABDOMINAL HYSTERECTOMY     BIV ICD GENERATOR CHANGEOUT N/A 09/17/2016   Procedure: BiV ICD Generator Changeout;  Surgeon: Duke Salvia, MD;  Location: [facility] INVASIVE CV LAB;  Service: Cardiovascular;  Laterality: N/A;   BLADDER SURGERY     CARDIAC CATHETERIZATION     CARDIAC DEFIBRILLATOR PLACEMENT  4 yrs ago   pacemaker   CATARACT EXTRACTION W/PHACO Left 09/01/2014   Procedure: CATARACT EXTRACTION PHACO AND INTRAOCULAR LENS PLACEMENT (IOC);  Surgeon: Lia Hopping, MD;  Location: [facility] ORS;  Service: Ophthalmology;  Laterality: Left;  [place]: 1:12.1    CATARACT EXTRACTION W/PHACO Right 09/29/2014   Procedure: CATARACT EXTRACTION PHACO AND INTRAOCULAR LENS PLACEMENT (IOC);  Surgeon: Lia Hopping, MD;  Location: [facility] ORS;  Service: Ophthalmology;  Laterality: Right;  [place]: 00:52.7    CHOLECYSTECTOMY     COLON SURGERY  COLONOSCOPY     COLONOSCOPY N/A 08/31/2012   Procedure: COLONOSCOPY;  Surgeon: Louis Meckel, MD;  Location: [facility] ENDOSCOPY;  Service: Endoscopy;  Laterality: N/A;   ESOPHAGOGASTRODUODENOSCOPY N/A 07/06/2012   Procedure: ESOPHAGOGASTRODUODENOSCOPY (EGD);  Surgeon: Hart Carwin, MD;  Location: [facility];  Service: Endoscopy;  Laterality: N/A;   INTRAMEDULLARY (IM) NAIL INTERTROCHANTERIC Right 10/23/2021   Procedure: INTRAMEDULLARY (IM) NAIL INTERTROCHANTERIC;  Surgeon: Christena Flake, MD;  Location:  [facility] ORS;  Service: Orthopedics;  Laterality: Right;   KNEE SURGERY Right    MIDDLE EAR SURGERY     SAVORY DILATION N/A 07/06/2012   Procedure: SAVORY DILATION;  Surgeon: Hart Carwin, MD;  Location: [facility] ENDOSCOPY;  Service: Endoscopy;  Laterality: N/A;   TEE WITHOUT CARDIOVERSION N/A 06/23/2020   Procedure: TRANSESOPHAGEAL ECHOCARDIOGRAM (TEE);  Surgeon: Antonieta Iba, MD;  Location: [facility] ORS;  Service: Cardiovascular;  Laterality: N/A;   WRIST SURGERY Right     SOCIAL HISTORY: Social History   Socioeconomic History   Marital status: Married    Spouse name: Not on file   Number of children: 3   Years of education: Not on file   Highest education level: Not on file  Occupational History   Occupation: Retired    Associate Professor: DISABLED  Tobacco Use   Smoking status: Former    Packs/day: 3.00    Years: 20.00    Additional pack years: 0.00    Total pack years: 60.00    Types: Cigarettes    Quit date: 02/04/2002    Years since quitting: 20.4   Smokeless tobacco: Never  Vaping Use   Vaping Use: Never used  Substance and Sexual Activity   Alcohol use: No   Drug use: No   Sexual activity: Not Currently  Other Topics Concern   Not on file  Social History Narrative   Not on file   Social Determinants of Health   Financial Resource Strain: Not on file  Food Insecurity: No Food Insecurity (06/10/2022)   Hunger Vital Sign    Worried About Running Out of Food in the Last Year: Never true    Ran Out of Food in the Last Year: Never true  Transportation Needs: No Transportation Needs (06/10/2022)   PRAPARE - Administrator, Civil Service (Medical): No    Lack of Transportation (Non-Medical): No  Physical Activity: Not on file  Stress: Not on file  Social Connections: Not on file  Intimate Partner Violence: Not At Risk (03/07/2022)   Humiliation, Afraid, Rape, and Kick questionnaire    Fear of Current or Ex-Partner: No    Emotionally Abused: No    Physically Abused: No     Sexually Abused: No    FAMILY HISTORY: Family History  Problem Relation Age of Onset   Hypertension Mother    Breast cancer Mother    Stroke Mother    Heart disease Father    Heart attack Sister    Hypertension Brother    Heart attack Sister    Colon cancer Brother 8    ALLERGIES:  is allergic to codeine and contrast media [iodinated contrast media].  MEDICATIONS:  Current Facility-Administered Medications  Medication Dose Route Frequency Provider Last Rate Last Admin   acetaminophen (TYLENOL) tablet 650 mg  650 mg Oral Q8H PRN Inez Catalina, MD   650 mg at 06/26/22 1834   atorvastatin (LIPITOR) tablet 80 mg  80 mg Oral Daily Amin, Ankit Chirag, MD   80 mg at 06/27/22  1052   barrier cream (non-specified) 1 Application  1 Application Topical BID PRN Inez Catalina, MD   1 Application at 06/24/22 1417   cefTRIAXone (ROCEPHIN) 1 g in sodium chloride 0.9 % 100 mL IVPB  1 g Intravenous Q24H Amin, Ankit Chirag, MD 200 mL/hr at 06/27/22 1345 1 g at 06/27/22 1345   Chlorhexidine Gluconate Cloth 2 % PADS 6 each  6 each Topical Daily Dimple Nanas, MD   6 each at 06/27/22 0730   clopidogrel (PLAVIX) tablet 75 mg  75 mg Oral Daily Debe Coder B, MD   75 mg at 06/27/22 1053   dextrose 5 % and 0.45 % NaCl infusion   Intravenous Continuous Amin, Ankit Chirag, MD 75 mL/hr at 06/27/22 1340 New Bag at 06/27/22 1340   dextrose 50 % solution 50 mL  1 ampule Intravenous PRN Amin, Ankit Chirag, MD       fluticasone furoate-vilanterol (BREO ELLIPTA) 200-25 MCG/ACT 1 puff  1 puff Inhalation Daily Inez Catalina, MD       folic acid (FOLVITE) tablet 1 mg  1 mg Oral Daily Debe Coder B, MD   1 mg at 06/27/22 1053   glucagon (human recombinant) (GLUCAGEN) injection 1 mg  1 mg Intravenous PRN Amin, Ankit Chirag, MD       guaiFENesin (ROBITUSSIN) 100 MG/5ML liquid 5 mL  5 mL Oral Q4H PRN Amin, Ankit Chirag, MD       heparin injection 5,000 Units  5,000 Units Subcutaneous Q8H Sharen Hones, RPH    5,000 Units at 06/27/22 1350   hydrALAZINE (APRESOLINE) injection 10 mg  10 mg Intravenous Q4H PRN Amin, Ankit Chirag, MD       insulin aspart (novoLOG) injection 0-9 Units  0-9 Units Subcutaneous Q4H Amin, Ankit Chirag, MD       ipratropium-albuterol (DUONEB) 0.5-2.5 (3) MG/3ML nebulizer solution 3 mL  3 mL Nebulization Q4H PRN Amin, Ankit Chirag, MD       levETIRAcetam (KEPPRA) tablet 750 mg  750 mg Oral BID Debe Coder B, MD   750 mg at 06/27/22 1053   levothyroxine (SYNTHROID) tablet 88 mcg  88 mcg Oral Q0600 Amin, Ankit Chirag, MD   88 mcg at 06/27/22 0537   liver oil-zinc oxide (DESITIN) 40 % ointment   Topical BID Dimple Nanas, MD   Given at 06/27/22 1053   loperamide (IMODIUM) capsule 4 mg  4 mg Oral PRN Amin, Ankit Chirag, MD   4 mg at 06/26/22 0530   metoprolol tartrate (LOPRESSOR) injection 5 mg  5 mg Intravenous Q4H PRN Amin, Ankit Chirag, MD       midodrine (PROAMATINE) tablet 10 mg  10 mg Oral TID WC Debe Coder B, MD   10 mg at 06/27/22 1350   mirtazapine (REMERON) tablet 7.5 mg  7.5 mg Oral BID Debe Coder B, MD   7.5 mg at 06/27/22 1054   nystatin cream (MYCOSTATIN)   Topical BID Foust, Katy L, NP   Given at 06/27/22 1054   ondansetron (ZOFRAN) injection 4 mg  4 mg Intravenous Q6H PRN Amin, Ankit Chirag, MD   4 mg at 06/25/22 2248   ondansetron (ZOFRAN) tablet 4 mg  4 mg Oral Q8H PRN Debe Coder B, MD       oxyCODONE (Oxy IR/ROXICODONE) immediate release tablet 5 mg  5 mg Oral Q4H PRN Debe Coder B, MD   5 mg at 06/27/22 0420   polyvinyl alcohol (LIQUIFILM TEARS) 1.4 % ophthalmic solution  1 drop  1 drop Both Eyes PRN Inez Catalina, MD       senna-docusate (Senokot-S) tablet 1 tablet  1 tablet Oral QHS PRN Amin, Ankit Chirag, MD       sodium bicarbonate tablet 650 mg  650 mg Oral TID Amin, Ankit Chirag, MD   650 mg at 06/27/22 1055   sodium chloride flush (NS) 0.9 % injection 3 mL  3 mL Intravenous Q12H Debe Coder B, MD   3 mL at 06/26/22 2122   traZODone  (DESYREL) tablet 50 mg  50 mg Oral QHS PRN Inez Catalina, MD        PHYSICAL EXAMINATION:   Vitals:   06/27/22 0805 06/27/22 1115  BP: (!) 89/66 (!) 93/34  Pulse: 80 85  Resp: 18 18  Temp: 97.9 F (36.6 C) 97.8 F (36.6 C)  SpO2: 100% 100%   Filed Weights   06/24/22 1248  Weight: 135 lb (61.2 kg)   Frail-appearing Caucasian female patient.  Alone.   Patient oriented 0-1.  Physical Exam Vitals and nursing note reviewed.  HENT:     Head: Normocephalic and atraumatic.     Mouth/Throat:     Pharynx: Oropharynx is clear.  Eyes:     Extraocular Movements: Extraocular movements intact.     Pupils: Pupils are equal, round, and reactive to light.  Cardiovascular:     Rate and Rhythm: Normal rate and regular rhythm.  Pulmonary:     Comments: Decreased breath sounds bilaterally.  Abdominal:     Palpations: Abdomen is soft.  Musculoskeletal:        General: Normal range of motion.     Cervical back: Normal range of motion.  Skin:    General: Skin is warm.  Neurological:     General: No focal deficit present.     LABORATORY DATA:  I have reviewed the data as listed Lab Results  Component Value Date   WBC 10.3 06/27/2022   HGB 8.0 (L) 06/27/2022   HCT 24.7 (L) 06/27/2022   MCV 82.9 06/27/2022   PLT 269 06/27/2022   Recent Labs    04/13/22 1128 04/14/22 0446 04/14/22 1037 06/05/22 1802 06/24/22 1318 06/24/22 2204 06/25/22 1427 06/26/22 0850 06/27/22 0631  NA  --  143   < > 140 129*   < > 129* 135 139  K  --  5.9*   < > 2.8* 2.6*   < > 3.0* 3.9 2.9*  CL  --  117*   < > 102 91*   < > 102 112* 114*  CO2  --  22   < > 29 17*   < > 13* 14* 16*  GLUCOSE  --  299*   < > 92 122*   < > 94 91 88  BUN  --  30*   < > 9 71*   < > 57* 50* 42*  CREATININE  --  1.50*   < > 1.01* 7.19*   < > 5.14* 3.89* 2.98*  CALCIUM  --  7.5*   < > 8.1* 7.5*   < > 7.4* 7.2* 7.7*  GFRNONAA  --  37*   < > 59* 6*   < > 8* 12* 16*  PROT 5.7* 5.4*  --  6.6 9.0*  --   --   --   --    ALBUMIN 2.0* 1.7*  --  1.8* 3.2*  --   --   --   --   AST  32 30  --  24 13*  --   --   --   --   ALT 14 12  --  8 <5  --   --   --   --   ALKPHOS 102 107  --  181* 169*  --   --   --   --   BILITOT 0.3 0.4  --  0.5 0.5  --   --   --   --   BILIDIR <0.1  --   --   --   --   --   --   --   --   IBILI NOT CALCULATED  --   --   --   --   --   --   --   --    < > = values in this interval not displayed.    RADIOGRAPHIC STUDIES: I have personally reviewed the radiological images as listed and agreed with the findings in the report. US RENAL  Result Date: 06/24/2022 CLINICAL DATA:  Acute kidney injury EXAM: RENAL / URINARY TRACT ULTRASOUND COMPLETE COMPARISON:  CT 06/24/2022 FINDINGS: Right Kidney: Renal measurements: 8.5 x 4.9 x 4.8 cm = volume: 104.3 mL. Echogenicity within normal limits. No mass or hydronephrosis visualized. Left Kidney: Renal measurements: 10 x 5.1 x 4.8 cm = volume: 126.6 mL. Echogenicity within normal limits. No hydronephrosis. Small cyst at the midpole measuring 14 mm, no imaging follow-up is recommended. Bladder: Appears normal for degree of bladder distention. Other: None. IMPRESSION: Essentially negative renal ultrasound. Electronically Signed   By: Jasmine Pang M.D.   On: 06/24/2022 21:44   CT ABDOMEN PELVIS WO CONTRAST  Result Date: 06/24/2022 CLINICAL DATA:  Acute abdominal pain, diarrhea EXAM: CT ABDOMEN AND PELVIS WITHOUT CONTRAST TECHNIQUE: Multidetector CT imaging of the abdomen and pelvis was performed following the standard protocol without IV contrast. RADIATION DOSE REDUCTION: This exam was performed according to the departmental dose-optimization program which includes automated exposure control, adjustment of the mA and/or kV according to patient size and/or use of iterative reconstruction technique. COMPARISON:  CT 04/11/2022 FINDINGS: Lower chest: Transvenous pacing lead to the RV apex. No pleural or pericardial effusion. Hepatobiliary: No focal liver  abnormality is seen. Status post cholecystectomy. No biliary dilatation. Pancreas: Unremarkable. No pancreatic ductal dilatation or surrounding inflammatory changes. Spleen: Normal in size without focal abnormality. Adrenals/Urinary Tract: No adrenal mass. Symmetric renal contours without urolithiasis or hydronephrosis. Urinary bladder nondistended. Stomach/Bowel: Stomach is incompletely distended, unremarkable. Small bowel decompressed. Appendix not discretely identified. The colon is incompletely distended, with a few descending diverticula. Anastomotic staple line in the proximal sigmoid segment. No acute findings. Vascular/Lymphatic: Scattered aortoiliac calcified atheromatous plaque without aneurysm. No abdominal or pelvic adenopathy. Reproductive: Status post hysterectomy. No adnexal masses. Other: Bilateral pelvic phleboliths.  No ascites.  No free air. Musculoskeletal: Stable moderate L1 vertebral compression deformity. Multilevel lumbar spondylitic change as before. Fixation hardware across right femoral IT fracture. No acute findings. IMPRESSION: 1. No acute findings. 2. Descending colon diverticulosis. 3. Stable L1 vertebral compression deformity. 4.  Aortic Atherosclerosis (ICD10-I70.0). Electronically Signed   By: Corlis Leak M.D.   On: 06/24/2022 14:54   DG Chest Port 1 View  Result Date: 06/24/2022 CLINICAL DATA:  Questionable sepsis, evaluate for abnormality. EXAM: PORTABLE CHEST 1 VIEW COMPARISON:  Radiographs 06/05/2022 and 04/11/2022.  CT 09/21/2009. FINDINGS: 1340 hours. Two views submitted. Unchanged left subclavian AICD with leads projecting to the right atrium, right ventricle and coronary sinus. The heart  size and mediastinal contours are stable. The lungs are clear. There is no pleural effusion or pneumothorax. No acute osseous findings are evident. Telemetry leads overlie the chest. IMPRESSION: No evidence of acute cardiopulmonary process. Stable appearance of the AICD. Electronically  Signed   By: Carey Bullocks M.D.   On: 06/24/2022 13:54   DG Chest Port 1 View  Result Date: 06/05/2022 CLINICAL DATA:  Possible seizure.  Weakness.  History of dementia. EXAM: PORTABLE CHEST 1 VIEW COMPARISON:  04/11/2022 FINDINGS: Stable cardiomediastinal silhouette. Left chest wall CRT-D. No focal consolidation, pleural effusion, or pneumothorax. No displaced rib fractures. IMPRESSION: No active disease. Electronically Signed   By: Minerva Fester M.D.   On: 06/05/2022 18:32   CT Head Wo Contrast  Result Date: 06/05/2022 CLINICAL DATA:  Seizures EXAM: CT HEAD WITHOUT CONTRAST TECHNIQUE: Contiguous axial images were obtained from the base of the skull through the vertex without intravenous contrast. RADIATION DOSE REDUCTION: This exam was performed according to the departmental dose-optimization program which includes automated exposure control, adjustment of the mA and/or kV according to patient size and/or use of iterative reconstruction technique. COMPARISON:  04/11/2022 FINDINGS: Brain: No acute intracranial findings are seen. There are no signs of bleeding within the cranium. There are foci of encephalomalacia in right parietal, left parietal and left occipital lobes with no interval change suggesting old infarcts. Cortical sulci are prominent. There is decreased density in periventricular white matter. Vascular: Unremarkable. Skull: No acute findings are seen in calvarium. Hyperostosis frontalis interna is seen. There is opacification of most of the right mastoid air cells. There is no break in the cortical margins. Sinuses/Orbits: No acute findings are seen. Other: None. IMPRESSION: No acute intracranial findings are seen. 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 Atrophy. Small vessel disease. 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 Electronically  Signed   By: Ernie Avena M.D.   On: 06/05/2022 18:27     No problem-specific Assessment & Plan notes found for this encounter.  # 72 y.o.  female multiple medical problems including congestive heart failure, COPD chronic hypoxia on oxygen pulmonary hypertension, dementia history of orthostatic hypotension midodrine NSVT status post ICD placement chronic anemia diabetes-is currently admitted to hospital for worsening diarrhea/acute renal failure; with abnormal kappa lambda light chains.  # Acute severe anemia-chronic worsening on admission.  No clear etiology noted-no evidence of iron deficiency or obvious hemoptysis.  No B12 deficiency.  Kappa lambda light abnormal however ratio is normal.  Clinically less likely multiple myeloma-given likely prerenal acute renal failure.   # Acute renal failure-likely prerenal s/p hydration improving.  # Recommendations/plan:  # Recommend ordering multiple myeloma panel.  Also recommend LDH; hemolysis workup.  Again clinically less likely to be multiple myeloma.  # However patient's chronic anemia is unclear-she might benefit from a bone marrow biopsy down the line-if patient is interested/acute issues resolved.   Thank you Dr.Amin  for allowing me to participate in the care of your pleasant patient. Please do not hesitate to contact me with questions or concerns in the interim.  Discussed with Dr. Nelson Chimes.   My contact information was given to the patient.     Earna Coder, MD 06/27/2022 2:20 PM

## 2022-06-27 NOTE — Consult Note (Signed)
PHARMACY CONSULT NOTE  Pharmacy Consult for Electrolyte Monitoring and Replacement   Recent Labs: Potassium (mmol/L)  Date Value  06/27/2022 3.3 (L)  10/13/2013 3.0 (L)   Magnesium (mg/dL)  Date Value  78/29/5621 1.6 (L)  03/09/2013 2.3   Calcium (mg/dL)  Date Value  30/86/5784 7.7 (L)   Calcium, Total (mg/dL)  Date Value  69/62/9528 9.3   Albumin (g/dL)  Date Value  41/32/4401 3.2 (L)  10/12/2020 3.9  09/15/2013 3.3 (L)   Phosphorus (mg/dL)  Date Value  02/72/5366 4.1   Sodium  Date Value  06/27/2022 139 mmol/L  07/18/2020 CANCELED  10/07/2013 142 mmol/L   Assessment: 72 yo female presented to the ED with diarrhea and weakness.  Patient admitted for treatment of AKI, diarrhea and electrolyte abnormalities.  Pharmacy consulted for electrolyte replacement.  AKI improved (Scr 7.19 >> 3.89>2.98)  MIVF: D51/2 NS at 75 cc/hr  Goal of Therapy:  Within normal limits  Plan:  --Mg 1.6, will give magnesium sulfate 2 g IV x 1 --K 2.9, will give 40 meq x1 oral plus Kcl 10eq IV x 2 doses  --Repeat K in 6 hrs and with AM labs --Follow all electrolytes with AM albs and replace as needed.  06/27/22@1630  --K 3.3 after receiving of Kcl. Will give another Kcl x 1 dose today and follow up AM labs for additional replacement if needed.   Eligah Anello Rodriguez-Guzman PharmD, BCPS 06/27/2022 4:27 PM

## 2022-06-27 NOTE — Progress Notes (Signed)
Occupational Therapy Treatment Patient Details Name: Stacy Grimes MRN: [id number] DOB: [birth year] Today's Date: 06/27/2022   History of present illness Pt is a 72 year old female with history of stroke, dementia, COPD on chronic 4 L nasal cannula,  orthostatic hypotension, cardiomyopathy, seizure disorder, PE, and severe PAH s/p AICD who presents to the emergency department for chief concerns of AKI, 18 days of diarrhea, decreased PO, low BP, dehydration.   OT comments  Pt received semi-reclined in bed. Appearing lethargic; O2 nasal cannula on top of head; OT replaced nasal cannula in nose; willing to work with OT on t/f to EOB. T/f MAX A x2; pt with poor participation. See flowsheet below for further details of session. Left semi-reclined in bed with all needs in reach.  Patient will benefit from continued OT while in acute care.    Recommendations for follow up therapy are one component of a multi-disciplinary discharge planning process, led by the attending physician.  Recommendations may be updated based on patient status, additional functional criteria and insurance authorization.    Assistance Recommended at Discharge Frequent or constant Supervision/Assistance  Patient can return home with the following  Two people to help with walking and/or transfers;Assistance with cooking/housework;Direct supervision/assist for medications management;Direct supervision/assist for financial management;Assist for transportation;Help with stairs or ramp for entrance;Two people to help with bathing/dressing/bathroom;Assistance with feeding   Equipment Recommendations  Other (comment) (defer to next venue of care)    Recommendations for Other Services      Precautions / Restrictions Precautions Precautions: Fall Restrictions Weight Bearing Restrictions: No       Mobility Bed Mobility Overal bed mobility: Needs Assistance Bed Mobility: Sidelying to Sit, Sit to Supine, Supine to Sit    Sidelying to sit: Max assist, +2 for physical assistance Supine to sit: Max assist, +2 for physical assistance Sit to supine: Max assist, +2 for physical assistance   General bed mobility comments: Increase in pain with bed mobility    Transfers Overall transfer level: Needs assistance Equipment used: Rolling walker (2 wheels) Transfers: Sit to/from Stand Sit to Stand: Total assist, +2 physical assistance           General transfer comment: pt with limited ability to assist, limited by pain, weakness, lethargy     Balance Overall balance assessment: Needs assistance Sitting-balance support: Feet unsupported Sitting balance-Leahy Scale: Poor Sitting balance - Comments: pt's toes barely touching floor. pt with decreased alertness.                                   ADL either performed or assessed with clinical judgement   ADL Overall ADL's : Needs assistance/impaired Eating/Feeding: Moderate assistance Eating/Feeding Details (indicate cue type and reason): OT had to set up food on silverware for pt and assisted with one bite, then pt able to hold silverware. Pt very disinterested in eating. Only ate one bite of mashed potatoes; did take bite of peaches and meat, but spit both into cup.                                   General ADL Comments: Pt MAX A for all other ADLs today (anticipated. Not tested).    Extremity/Trunk Assessment Upper Extremity Assessment Upper Extremity Assessment: Generalized weakness   Lower Extremity Assessment Lower Extremity Assessment: Defer to PT evaluation;Generalized weakness  Vision       Perception     Praxis      Cognition Arousal/Alertness: Lethargic Behavior During Therapy: Restless, Flat affect Overall Cognitive Status: No family/caregiver present to determine baseline cognitive functioning                                 General Comments: does speak to PT/OT, but when  resting, returns to sleeping/eyes closed.        Exercises      Shoulder Instructions       General Comments P on 4L throughout session; O2 sats remaining stable. Semi-reclined BP 99/54, HR 79. Sitting BP 108.78, HR 102.    Pertinent Vitals/ Pain       Pain Assessment Pain Assessment: Faces Faces Pain Scale: Hurts even more Pain Location: buttocks Pain Descriptors / Indicators: Grimacing, Crying, Moaning Pain Intervention(s): Limited activity within patient's tolerance, Monitored during session  Home Living                                          Prior Functioning/Environment              Frequency  Min 1X/week        Progress Toward Goals  OT Goals(current goals can now be found in the care plan section)  Progress towards OT goals: Not progressing toward goals - comment (very limited today)  Acute Rehab OT Goals Patient Stated Goal: Pain to stop OT Goal Formulation: With patient Time For Goal Achievement: 07/10/22 Potential to Achieve Goals: Fair ADL Goals Pt Will Perform Grooming: with set-up;with supervision;sitting Pt Will Perform Upper Body Dressing: with supervision;sitting Pt Will Transfer to Toilet: with min assist;bedside commode  Plan Discharge plan remains appropriate    Co-evaluation    PT/OT/SLP Co-Evaluation/Treatment: Yes Reason for Co-Treatment: Complexity of the patient's impairments (multi-system involvement);For patient/therapist safety;To address functional/ADL transfers PT goals addressed during session: Mobility/safety with mobility;Proper use of DME OT goals addressed during session: Strengthening/ROM      AM-PAC OT "6 Clicks" Daily Activity     Outcome Measure   Help from another person eating meals?: A Lot Help from another person taking care of personal grooming?: A Lot Help from another person toileting, which includes using toliet, bedpan, or urinal?: Total Help from another person bathing (including  washing, rinsing, drying)?: Total Help from another person to put on and taking off regular upper body clothing?: A Lot Help from another person to put on and taking off regular lower body clothing?: Total 6 Click Score: 9    End of Session Equipment Utilized During Treatment: Gait belt;Rolling walker (2 wheels)  OT Visit Diagnosis: Unsteadiness on feet (R26.81);Muscle weakness (generalized) (M62.81)   Activity Tolerance Patient limited by pain;Patient limited by fatigue   Patient Left in bed;with call bell/phone within reach;with bed alarm set   Nurse Communication Mobility status; pt c/o nasal cannula and may need humidifier.        Time: 1610-9604 OT Time Calculation (min): 36 min  Charges: OT General Charges $OT Visit: 1 Visit OT Treatments $Self Care/Home Management : 8-22 mins  Linward Foster, MS, OTR/L  Alvester Morin 06/27/2022, 3:26 PM

## 2022-06-27 NOTE — Consult Note (Signed)
PHARMACY CONSULT NOTE  Pharmacy Consult for Electrolyte Monitoring and Replacement   Recent Labs: Potassium (mmol/L)  Date Value  06/27/2022 2.9 (L)  10/13/2013 3.0 (L)   Magnesium (mg/dL)  Date Value  16/11/9602 1.6 (L)  03/09/2013 2.3   Calcium (mg/dL)  Date Value  54/10/8117 7.7 (L)   Calcium, Total (mg/dL)  Date Value  14/78/2956 9.3   Albumin (g/dL)  Date Value  21/30/8657 3.2 (L)  10/12/2020 3.9  09/15/2013 3.3 (L)   Phosphorus (mg/dL)  Date Value  84/69/6295 4.1   Sodium  Date Value  06/27/2022 139 mmol/L  07/18/2020 CANCELED  10/07/2013 142 mmol/L   Assessment: 72 yo female presented to the ED with diarrhea and weakness.  Patient admitted for treatment of AKI, diarrhea and electrolyte abnormalities.  Pharmacy consulted for electrolyte replacement.  AKI improved (Scr 7.19 >> 3.89>2.98)  MIVF: D51/2 NS at 75 cc/hr  Goal of Therapy:  Within normal limits  Plan:  --Mg 1.6, will give magnesium sulfate 2 g IV x 1 --K 2.9, will give 40 meq x1 oral plus Kcl 10eq IV x 2 doses  --Repeat K in 6 hrs and with AM labs --Follow all electrolytes with AM albs and replace as needed.  Parish Dubose Rodriguez-Guzman PharmD, BCPS 06/27/2022 7:46 AM

## 2022-06-27 NOTE — Progress Notes (Signed)
Physical Therapy Treatment Patient Details Name: Stacy Grimes MRN: [id number] DOB: 1950-03-21 Today's Date: 06/27/2022   History of Present Illness Pt is a 72 year old female with history of stroke, dementia, COPD on chronic 4 L nasal cannula,  orthostatic hypotension, cardiomyopathy, seizure disorder, PE, and severe PAH s/p AICD who presents to the emergency department for chief concerns of AKI, 18 days of diarrhea, decreased PO, low BP, dehydration.    PT Comments    Pt lethargic but wakes to voice, touch, but did return to resting, eyes closed if not directly spoken to. Seen as PT/OT co-treat to maximize function and safety. She demonstrated an increased need of assistance today. MaxAx2 for all bed mobility, and totalAx2 to stand. Attempted twice, pt with little to no participation in standing. Returned to supine and repositioned (quarter turned due to buttocks skin concerns and LUE elevated). LUE swollen due to IV infiltration. The patient would benefit from further skilled PT intervention to continue to progress towards goals. Recommendation remains appropriate.     Recommendations for follow up therapy are one component of a multi-disciplinary discharge planning process, led by the attending physician.  Recommendations may be updated based on patient status, additional functional criteria and insurance authorization.  Follow Up Recommendations  Can patient physically be transported by private vehicle: No    Assistance Recommended at Discharge Frequent or constant Supervision/Assistance  Patient can return home with the following Two people to help with walking and/or transfers;Two people to help with bathing/dressing/bathroom;Assistance with cooking/housework;Direct supervision/assist for medications management;Help with stairs or ramp for entrance;Assist for transportation   Equipment Recommendations  Other (comment) (TBD at next venue of care)    Recommendations for Other Services        Precautions / Restrictions Precautions Precautions: Fall Restrictions Weight Bearing Restrictions: No     Mobility  Bed Mobility Overal bed mobility: Needs Assistance Bed Mobility: Sidelying to Sit, Sit to Supine, Supine to Sit   Sidelying to sit: Max assist, +2 for physical assistance Supine to sit: Max assist, +2 for physical assistance          Transfers Overall transfer level: Needs assistance Equipment used: Rolling walker (2 wheels) Transfers: Sit to/from Stand Sit to Stand: Total assist, +2 physical assistance           General transfer comment: pt with limited ability to assist, limited by pain, weakness, lethargy    Ambulation/Gait                   Stairs             Wheelchair Mobility    Modified Rankin (Stroke Patients Only)       Balance                                            Cognition Arousal/Alertness: Lethargic Behavior During Therapy: Restless, Flat affect Overall Cognitive Status: No family/caregiver present to determine baseline cognitive functioning                                 General Comments: does speak to PT/OT, but when resting, returns to sleeping/eyes closed        Exercises      General Comments        Pertinent Vitals/Pain Pain Assessment Pain Assessment: Faces Faces  Pain Scale: Hurts even more Pain Location: buttocks Pain Descriptors / Indicators: Grimacing, Crying, Moaning Pain Intervention(s): Limited activity within patient's tolerance, Monitored during session, Repositioned    Home Living                          Prior Function            PT Goals (current goals can now be found in the care plan section) Progress towards PT goals: Progressing toward goals    Frequency    Min 2X/week      PT Plan Current plan remains appropriate    Co-evaluation PT/OT/SLP Co-Evaluation/Treatment: Yes Reason for Co-Treatment: Complexity  of the patient's impairments (multi-system involvement);For patient/therapist safety;To address functional/ADL transfers PT goals addressed during session: Mobility/safety with mobility;Proper use of DME OT goals addressed during session: Strengthening/ROM      AM-PAC PT "6 Clicks" Mobility   Outcome Measure  Help needed turning from your back to your side while in a flat bed without using bedrails?: A Lot Help needed moving from lying on your back to sitting on the side of a flat bed without using bedrails?: A Lot Help needed moving to and from a bed to a chair (including a wheelchair)?: Total Help needed standing up from a chair using your arms (e.g., wheelchair or bedside chair)?: Total Help needed to walk in hospital room?: Total Help needed climbing 3-5 steps with a railing? : Total 6 Click Score: 8    End of Session Equipment Utilized During Treatment: Gait belt Activity Tolerance: Patient limited by fatigue;Patient limited by pain Patient left: in bed;with call bell/phone within reach;with bed alarm set Nurse Communication: Mobility status PT Visit Diagnosis: Other abnormalities of gait and mobility (R26.89);Muscle weakness (generalized) (M62.81);Difficulty in walking, not elsewhere classified (R26.2)     Time: 1610-9604 PT Time Calculation (min) (ACUTE ONLY): 20 min  Charges:  $Therapeutic Activity: 8-22 mins                     Olga Coaster PT, DPT 2:09 PM,06/27/22

## 2022-06-28 ENCOUNTER — Ambulatory Visit: Payer: Self-pay | Admitting: *Deleted

## 2022-06-28 DIAGNOSIS — E876 Hypokalemia: Secondary | ICD-10-CM | POA: Diagnosis not present

## 2022-06-28 DIAGNOSIS — E872 Acidosis, unspecified: Secondary | ICD-10-CM | POA: Diagnosis not present

## 2022-06-28 DIAGNOSIS — E861 Hypovolemia: Secondary | ICD-10-CM | POA: Diagnosis not present

## 2022-06-28 DIAGNOSIS — N179 Acute kidney failure, unspecified: Secondary | ICD-10-CM | POA: Diagnosis not present

## 2022-06-28 LAB — GLUCOSE, CAPILLARY
Glucose-Capillary: 58 mg/dL — ABNORMAL LOW (ref 70–99)
Glucose-Capillary: 156 mg/dL — ABNORMAL HIGH (ref 70–99)
Glucose-Capillary: 96 mg/dL (ref 70–99)
Glucose-Capillary: 97 mg/dL (ref 70–99)
Glucose-Capillary: 95 mg/dL (ref 70–99)
Glucose-Capillary: 74 mg/dL (ref 70–99)

## 2022-06-28 LAB — BASIC METABOLIC PANEL
Anion gap: 6 (ref 5–15)
BUN: 31 mg/dL — ABNORMAL HIGH (ref 8–23)
CO2: 16 mmol/L — ABNORMAL LOW (ref 22–32)
Calcium: 7.6 mg/dL — ABNORMAL LOW (ref 8.9–10.3)
Chloride: 118 mmol/L — ABNORMAL HIGH (ref 98–111)
Creatinine, Ser: 1.83 mg/dL — ABNORMAL HIGH (ref 0.44–1.00)
GFR, Estimated: 29 mL/min — ABNORMAL LOW (ref >60)
Glucose, Bld: 97 mg/dL (ref 70–99)
Potassium: 3.8 mmol/L (ref 3.5–5.1)
Sodium: 140 mmol/L (ref 135–145)

## 2022-06-28 LAB — CBC
HCT: 22.5 % — ABNORMAL LOW (ref 36.0–46.0)
Hemoglobin: 7.1 g/dL — ABNORMAL LOW (ref 12.0–15.0)
MCH: 26.6 pg (ref 26.0–34.0)
MCHC: 31.6 g/dL (ref 30.0–36.0)
MCV: 84.3 fL (ref 80.0–100.0)
Platelets: 202 10*3/uL (ref 150–400)
RBC: 2.67 MIL/uL — ABNORMAL LOW (ref 3.87–5.11)
RDW: 22 % — ABNORMAL HIGH (ref 11.5–15.5)
WBC: 9.1 10*3/uL (ref 4.0–10.5)
nRBC: 0.4 % — ABNORMAL HIGH (ref 0.0–0.2)

## 2022-06-28 LAB — MAGNESIUM: Magnesium: 1.8 mg/dL (ref 1.7–2.4)

## 2022-06-28 LAB — LACTATE DEHYDROGENASE: LDH: 274 U/L — ABNORMAL HIGH (ref 98–192)

## 2022-06-28 MED ORDER — DEXTROSE IN LACTATED RINGERS 5 % IV SOLN: INTRAVENOUS | Status: DC

## 2022-06-28 MED ORDER — DEXTROSE IN LACTATED RINGERS 5 % IV SOLN: INTRAVENOUS | Status: AC

## 2022-06-28 NOTE — TOC Initial Note (Signed)
[facility] of [facility]) - Initial/Assessment Note    Patient Details  Name: Stacy Grimes MRN: [id number] Date of Birth: 1950/06/23  Transition [facility] CM/SW Contact:    Truddie Hidden, RN Phone Number: 06/28/2022, 2:37 PM  Clinical Narrative:   Admitted for: Renal failure Admitted from: Home. Patient lives with her husband and grandson  Per husband she is active with Rolene Arbour  PCP: Dr. [name]  [facility]  Current home health/prior home health/DME:rollator, bp cuff, cane, walker.  Per spouse patient will need EMS transport home.      Patient Goals and CMS Choice            Expected Discharge Plan and Services                                              Prior Living Arrangements/Services                       Activities of Daily Living Home Assistive Devices/Equipment: Environmental consultant (specify type), Eyeglasses ADL Screening (condition at time of admission) Patient's cognitive ability adequate to safely complete daily activities?: No Is the patient deaf or have difficulty hearing?: Yes Does the patient have difficulty seeing, even when wearing glasses/contacts?: No Does the patient have difficulty concentrating, remembering, or making decisions?: Yes Patient able to express need for assistance with ADLs?: No Does the patient have difficulty dressing or bathing?: Yes Independently performs ADLs?: No Communication: Independent Dressing (OT): Needs assistance Is this a change from baseline?: Pre-admission baseline Grooming: Needs assistance Is this a change from baseline?: Pre-admission baseline Feeding: Needs assistance Is this a change from baseline?: Change from baseline, expected to last <3 days Bathing: Needs assistance Is this a change from baseline?: Pre-admission baseline Toileting: Dependent Is this a change from baseline?: Pre-admission baseline In/Out Bed: Dependent Is this a change from baseline?:  Pre-admission baseline Walks in Home: Dependent Is this a change from baseline?: Pre-admission baseline Does the patient have difficulty walking or climbing stairs?: Yes Weakness of Legs: Both Weakness of Arms/Hands: Both  Permission Sought/Granted                  Emotional Assessment              Admission diagnosis:  Hypovolemia [E86.1] Hypokalemia [E87.6] Lactic acidosis [E87.20] AKI (acute kidney injury) (HCC) [N17.9] Patient Active Problem List   Diagnosis Date Noted   Normocytic anemia 04/13/2022   Thrombocytopenia (HCC) 04/13/2022   Lactic acidosis 04/13/2022   Malnutrition of moderate degree 04/12/2022   Altered mental status 04/11/2022   Hyperkalemia 04/11/2022   Nausea 03/10/2022   NSVT (nonsustained ventricular tachycardia) (HCC) 03/08/2022   Breakthrough seizure (HCC) 03/07/2022   Hypomagnesemia 03/07/2022   Hypocalcemia 03/07/2022   Chronic respiratory failure with hypoxia (HCC) 03/07/2022   Acute metabolic encephalopathy 03/07/2022   Hypothyroidism 03/07/2022   AAA (abdominal aortic aneurysm) (HCC) 03/07/2022   Depression 03/07/2022   Diarrhea 03/07/2022   ICD (implantable cardioverter-defibrillator) in place 03/07/2022   Protein calorie malnutrition (HCC) 01/11/2022   Seizures (HCC) 01/08/2022   Acute on chronic respiratory failure with hypoxia and hypercapnia (HCC) 01/08/2022   Hypokalemia 01/08/2022   Wide-complex tachycardia 01/08/2022   Status epilepticus (HCC) 01/08/2022   Femur fracture (HCC) 12/10/2021   Fall 12/10/2021   Dementia with behavioral disturbance (HCC) 12/10/2021  COVID-19 virus infection 12/10/2021   AKI (acute kidney injury) (HCC) 12/10/2021   Shock circulatory (HCC)    Hip fracture, unspecified laterality, closed, initial encounter (HCC) 10/23/2021   Hip fracture (HCC) 10/23/2021   CHF (congestive heart failure) (HCC) 10/23/2021   Orthostatic hypotension 08/06/2021   Chronic congestive heart failure (HCC)    Acute  ischemic stroke (HCC) 06/21/2020   Chronic obstructive pulmonary disease, unspecified COPD type (HCC) 06/20/2020   Type 2 diabetes mellitus with hyperglycemia, unspecified whether long term insulin use (HCC) 06/20/2020   Left knee pain 12/16/2019   Elevated troponin I level 12/15/2019   Dehydration 04/12/2018   Syncope 04/09/2018   Benign neoplasm of colon 08/31/2012   Diverticulosis of colon (without mention of hemorrhage) 08/31/2012   Generalized weakness 08/10/2012   Personal history of colonic polyps 07/02/2012   Dysphagia, unspecified(787.20) 07/02/2012   Unspecified constipation 07/02/2012   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 07/09/2010   Hypertension 03/24/2009   SLEEP APNEA, OBSTRUCTIVE 03/23/2009   Dilated cardiomyopathy (HCC) 03/23/2009   Chronic systolic CHF (congestive heart failure) (HCC) 03/23/2009   PALPITATIONS 03/23/2009   MURMUR 03/23/2009   COUGH 03/23/2009   PCP:  [name], MD Pharmacy:   [facility] [address] [place], [place] - [address] [address] [place] [address] Phone: [phone] Fax: [phone]  [facility] [address] [place] [address] Phone: [phone] Fax: [phone]  [facility] [address] [place] [address] Phone: [phone] Fax: [phone]     Social Determinants of Health (SDOH) Social History: SDOH Screenings   Food Insecurity: No Food Insecurity (06/10/2022)  Housing: Patient Unable To Answer (06/10/2022)  Transportation Needs: No Transportation Needs (06/10/2022)  Utilities: Not At Risk (03/07/2022)  Depression (PHQ2-9): Low Risk  (09/01/2019)  Tobacco Use: Medium Risk (06/26/2022)   SDOH Interventions:     Readmission Risk Interventions    04/12/2022    1:27 PM 03/08/2022    1:45 PM  Readmission Risk Prevention Plan  Transportation Screening Complete Complete   Medication Review (RN Care Manager) Complete Complete  PCP or Specialist appointment within 3-5 days of discharge Complete Complete  HRI or Home Care Consult Complete Complete  SW Recovery Care/Counseling Consult Complete Complete  Palliative Care Screening Not Complete   Skilled Nursing Facility Not Complete Complete

## 2022-06-28 NOTE — Care Management Important Message (Signed)
Important Message  Patient Details  Name: Stacy Grimes MRN: [id number] Date of Birth: January 27, 1951   Medicare Important Message Given:  Yes  Verbal consent obtained for Medicare IM from Kinnie Feil, spouse. Copy of Medicare IM left in room for reference.    [name] 06/28/2022, 4:21 PM

## 2022-06-28 NOTE — Patient Outreach (Signed)
  Care Coordination   Follow Up Visit Note   06/28/2022 Name: SUAN SILLMAN MRN: [id number] DOB: 01-01-1951  Lewanda Rife is a 72 y.o. year old female who sees [name], MD for primary care.   Outreach scheduled for today, noted that patient currently admitted to hospital since 5/20.  Hospital liaisons notified.    SDOH assessments and interventions completed:  No     Care Coordination Interventions:  Yes, provided   Interventions Today    Flowsheet Row Most Recent Value  General Interventions   General Interventions Discussed/Reviewed Communication with  Communication with RN  [facility] liaisons notified of current admission]        Follow up plan:  Pending hospital discharge    Encounter Outcome:  Pt. Visit Completed   Kemper Durie, RN, MSN, [facility] Care Management Care Management Coordinator [phone]

## 2022-06-28 NOTE — Progress Notes (Signed)
Pt arrived to the unit, alert to self, husband, x 3. Denies pain an dis sleepy. Wound care provided to buttocks and sacral dressing changed on patient. Pt ordered her dinner with the assistance of her husband.  Pt blood glucose was 94 at this time. She is a person who needs assistance with all meals and has to be fed. Pt is lethargic and sleepy most of the time. Pt will always say she is not hungry per her husband but make all attempt at trying to get her to eat something.

## 2022-06-28 NOTE — Progress Notes (Addendum)
Pt did not eat dinner tonght. Attempted to feed patient x 3 but all failed.  Pt likes to sleep.

## 2022-06-28 NOTE — Consult Note (Signed)
PHARMACY CONSULT NOTE  Pharmacy Consult for Electrolyte Monitoring and Replacement   Recent Labs: Potassium (mmol/L)  Date Value  06/28/2022 3.8  10/13/2013 3.0 (L)   Magnesium (mg/dL)  Date Value  16/11/9602 1.8  03/09/2013 2.3   Calcium (mg/dL)  Date Value  54/10/8117 7.6 (L)   Calcium, Total (mg/dL)  Date Value  14/78/2956 9.3   Albumin (g/dL)  Date Value  21/30/8657 3.2 (L)  10/12/2020 3.9  09/15/2013 3.3 (L)   Phosphorus (mg/dL)  Date Value  84/69/6295 4.1   Sodium  Date Value  06/28/2022 140 mmol/L  07/18/2020 CANCELED  10/07/2013 142 mmol/L   Assessment: 72 yo female presented to the ED with diarrhea and weakness.  Patient admitted for treatment of AKI, diarrhea and electrolyte abnormalities.  Pharmacy consulted for electrolyte replacement.  AKI improved (Scr 7.19 >> 3.89>2.98)  MIVF: D51/2 NS at 75 cc/hr  Goal of Therapy:  Within normal limits  Plan:  --Hypokalemia and hypomagnesemia resolved. No additional replacement needed today. --Follow all electrolytes with AM albs and replace as needed.   Keosha Rossa Rodriguez-Guzman PharmD, BCPS 06/28/2022 10:19 AM

## 2022-06-28 NOTE — Consult Note (Signed)
[facility] Customer service manager [facility]) Accountable Care Organization (ACO) [facility] Liaison Note  06/28/2022  Stacy Grimes [birth year] [id number]  [facility] CM Inpatient Consult  Location: [facility] RN Hospital Liaison screened the patient remotely at [facility]. Coverage for Stacy Cousin, RN [facility] Liaison   Alert by [facility] RN Care Coordinator of patient's admission.  Insurance: [employer]   Stacy Grimes is a 72 y.o. female who is a Primary Care Patient of Stacy Baseman, MD. The patient was screened for day readmission hospitalization with noted extreme high risk score for unplanned readmission risk with 4 IP/1 ED in 6 months.  The patient was assessed for ongoing and new care coordination needs as patient is currently active with [facility] RN CC. Review of patient's electronic medical record reveals patient is from home and admitted to [facility] for AKI.   Plan: [facility] Liaison will continue to follow progress and disposition to asess for post hospital community care coordination/management needs.  Referral request for community care coordination: anticipate [facility] Transitions of Care Team follow up. Patient has been active with a [facility] RN Care Coordinator who is aware of admission status.   [facility] Care Management/Population Health does not replace or interfere with any arrangements made by the Inpatient Transition of Care team.   For questions contact:   Charlesetta Shanks, RN BSN CCM [facility]  [phone] business mobile phone Toll free office [phone]  *Concierge Line  [phone] Fax number: [phone] [email or web address]

## 2022-06-28 NOTE — Progress Notes (Signed)
OT Cancellation Note  Patient Details Name: Stacy Grimes MRN: [id number] DOB: 1950-10-02   Cancelled Treatment:    Reason Eval/Treat Not Completed: Fatigue/lethargy limiting ability to participate. Husband in room during 5/24 OT attempted visit. Husband provided following information: Between Sept-Nov 2023 pt was able to stand and pivot to [facility] next to bed; after that, she has been mostly bedbound due to being dizzy upon sitting up, but awake during the day. Since March 2024, pt has been sleeping much more of the time.   Alvester Morin 06/28/2022, 3:49 PM

## 2022-06-28 NOTE — Progress Notes (Signed)
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  Upon admission she was found to be hypotensive requiring IV fluids and midodrine.  Also found to be significantly dehydrated with hypokalemia and acute kidney injury with metabolic acidosis.  CT scan was unremarkable.  Renal ultrasound was unremarkable.    Assessment/Plan:    Acute kidney injury -Metabolic acidosis; in setting of severe dehydration -Baseline creatinine 1.0 ;as of 06/05/2022 -Presented with creatinine of 7.19, improved to 1.83 -Started on D5 half-normal saline along with sodium bicarb infusion -Will change IV fluids to D5 LR at 100 mL/h -CT abdomen/pelvis unremarkable, renal ultrasound negative -Foley catheter placed, continue to monitor urine output   Diarrhea -Resolved; likely gastroenteritis -CT abdomen/pelvis was unremarkable -GI pathogen panel and C. difficile is negative  UTI -Started on IV Rocephin; treat for total 5 days -Stop date 5/26  Abnormal kappa lambda light chain ratio -Hematology consulted -Multiple myeloma panel ordered  Anemia -Unclear etiology -Hemoglobin is 7.1 this morning -Check stool for occult blood -S/p 1 unit PRBC -Transfuse for hemoglobin less than 7  Orthostatic hypotension -Unclear etiology -BP still soft, continue midodrine -Cortisol level normal at 26.7  rom poor oral intake.  On D5 1/2 NS. Prn D50 and Glucagon ordered.    Chronic congestive heart failure (HCC) Biventricular implantable  cardioverter-defibrillator-St. Jude's NSVT (nonsustained ventricular tachycardia) (HCC) -Currently dehydrated.  Closely monitor.    Chronic obstructive pulmonary disease, unspecified COPD type (HCC) Chronic respiratory failure with hypoxia (HCC) -Bronchodilators.  On scheduled Breo    Type 2 diabetes mellitus with hypoglycemia, unspecified whether long term insulin use (HCC) -A1c is 5.9.   -Currently low blood glucose.   -Closely monitor    Dementia with behavioral disturbance (HCC) -Continue donepezil and Namenda    Seizures (HCC) - Continue home keppra    Hypothyroidism -Low TSH, slightly high ft4.  -Reduce Synthroid .  -Repeat levels in 4 weeks outpatient     Medications     atorvastatin  80 mg Oral Daily   Chlorhexidine Gluconate Cloth  6 each Topical Daily   clopidogrel  75 mg Oral Daily   fluticasone furoate-vilanterol  1 puff Inhalation Daily   folic acid  1 mg Oral Daily   heparin injection (subcutaneous)  5,000 Units Subcutaneous Q8H   insulin aspart  0-9 Units Subcutaneous Q4H   levETIRAcetam  750 mg Oral BID   levothyroxine  88 mcg Oral Q0600   liver oil-zinc oxide   Topical BID   midodrine  10 mg Oral TID WC   mirtazapine  7.5 mg Oral BID   nystatin cream   Topical BID   sodium chloride flush  3 mL Intravenous Q12H     Data Reviewed:   CBG:  Recent Labs  Lab 06/27/22 2338 06/28/22 0353 06/28/22 0526 06/28/22 0824 06/28/22 1148  GLUCAP 92 58* 156* 96 97    SpO2: 100 % O2 Flow Rate (L/min): 4 L/min    Vitals:   06/27/22 2335 06/28/22 0356 06/28/22 0821 06/28/22 1151  BP: (!) 96/53 (!) 99/52 (!) 87/45 (!) 100/50  Pulse: 91 88 85 92  Resp: 20 18 18 18   Temp: (!) 97.5 F (36.4 C) 97.9 F (36.6 C) (!) 97.5 F (36.4 C) 98.7 F (37.1 C)  TempSrc:   Oral Oral  SpO2: 100% 100% 100% 100%  Weight:          Data Reviewed:  Basic Metabolic Panel: Recent Labs  Lab 06/24/22 1318 06/24/22 2204 06/25/22 0415 06/25/22 0919  06/25/22 1427 06/26/22 0850 06/27/22 0631 06/27/22 1456 06/28/22 0908  NA 129*   < > 128*  --  129* 135 139  --  140  K 2.6*   < > 3.5  --  3.0* 3.9 2.9* 3.3* 3.8  CL 91*   < > 100  --  102 112* 114*  --  118*  CO2 17*   < > 15*  --  13* 14* 16*  --  16*  GLUCOSE 122*   < > 74  --  94 91 88  --  97  BUN 71*   < > 64*  --  57* 50* 42*  --  31*  CREATININE 7.19*   < > 5.74*  --  5.14* 3.89* 2.98*  --  1.83*  CALCIUM 7.5*   < > 6.6*  --  7.4* 7.2* 7.7*  --  7.6*  MG 1.6*  --   --   --   --  1.7 1.6*  --  1.8  PHOS  --   --   --  4.1  --   --   --   --   --    < > = values in this interval not displayed.    CBC: Recent Labs  Lab 06/24/22 1318 06/24/22 2204 06/25/22 0415 06/25/22 0919 06/26/22 0850 06/27/22 0631 06/28/22 0908  WBC 14.5* 13.8* 13.2*  --  12.0* 10.3 9.1  NEUTROABS 12.5*  --   --   --   --   --   --   HGB 9.3* 6.1* 6.3* 6.3* 8.1* 8.0* 7.1*  HCT 30.4* 19.4* 20.8* 20.4* 25.7* 24.7* 22.5*  MCV 80.9 79.2* 83.2  --  86.5 82.9 84.3  PLT PLATELET CLUMPS NOTED ON SMEAR, UNABLE TO ESTIMATE 330 315  --  292 269 202    LFT Recent Labs  Lab 06/24/22 1318  AST 13*  ALT <5  ALKPHOS 169*  BILITOT 0.5  PROT 9.0*  ALBUMIN 3.2*     Antibiotics: Anti-infectives (From admission, onward)    Start     Dose/Rate Route Frequency Ordered Stop   06/25/22 1400  cefTRIAXone (ROCEPHIN) 1 g in sodium chloride 0.9 % 100 mL IVPB        1 g 200 mL/hr over 30 Minutes Intravenous Every 24 hours 06/25/22 1229 06/30/22 1359   06/24/22 1315  ceFEPIme (MAXIPIME) 2 g in sodium chloride 0.9 % 100 mL IVPB        2 g 200 mL/hr over 30 Minutes Intravenous  Once 06/24/22 1313 06/24/22 1633   06/24/22 1315  metroNIDAZOLE (FLAGYL) IVPB 500 mg        500 mg 100 mL/hr over 60 Minutes Intravenous  Once 06/24/22 1313 06/24/22 1716   06/24/22 1315  vancomycin (VANCOCIN) IVPB 1000 mg/200 mL premix        1,000 mg 200 mL/hr over 60 Minutes Intravenous  Once 06/24/22 1313 06/24/22 1833         DVT prophylaxis: Heparin  Code Status: Full code  Family Communication:    CONSULTS Hematology   Subjective   Patient  seen and examined, denies any complaints.  Objective    Physical Examination:  General-appears in no acute distress Heart-S1-S2, regular, no murmur auscultated Lungs-clear to auscultation bilaterally, no wheezing or crackles auscultated Abdomen-soft, nontender, no organomegaly Extremities-no edema in the lower extremities Neuro-alert, oriented x3, no focal deficit noted   Status is: Inpatient:      Pressure Injury 06/25/22 Buttocks Right;Left;Medial Stage 2 -  Partial thickness loss of dermis presenting as a shallow open injury with a red, pink wound bed without slough. (Active)  06/25/22 0423  Location: Buttocks  Location Orientation: Right;Left;Medial  Staging: Stage 2 -  Partial thickness loss of dermis presenting as a shallow open injury with a red, pink wound bed without slough.  Wound Description (Comments):   Present on Admission: Yes        Leeroy Lovings S Mialynn Shelvin   [facility] If 7PM-7AM, please contact night-coverage at [email or web address], Office  [phone]   06/28/2022, 12:56 PM  LOS: 4 days

## 2022-06-28 NOTE — Plan of Care (Signed)

## 2022-06-29 DIAGNOSIS — G9341 Metabolic encephalopathy: Secondary | ICD-10-CM | POA: Diagnosis not present

## 2022-06-29 DIAGNOSIS — N179 Acute kidney failure, unspecified: Secondary | ICD-10-CM | POA: Diagnosis not present

## 2022-06-29 DIAGNOSIS — E876 Hypokalemia: Secondary | ICD-10-CM | POA: Diagnosis not present

## 2022-06-29 DIAGNOSIS — R531 Weakness: Secondary | ICD-10-CM | POA: Diagnosis not present

## 2022-06-29 LAB — GLUCOSE, CAPILLARY
Glucose-Capillary: 103 mg/dL — ABNORMAL HIGH (ref 70–99)
Glucose-Capillary: 83 mg/dL (ref 70–99)
Glucose-Capillary: 85 mg/dL (ref 70–99)
Glucose-Capillary: 88 mg/dL (ref 70–99)
Glucose-Capillary: 91 mg/dL (ref 70–99)
Glucose-Capillary: 93 mg/dL (ref 70–99)

## 2022-06-29 LAB — CBC
HCT: 22.3 % — ABNORMAL LOW (ref 36.0–46.0)
Hemoglobin: 7.1 g/dL — ABNORMAL LOW (ref 12.0–15.0)
MCH: 26.6 pg (ref 26.0–34.0)
MCHC: 31.8 g/dL (ref 30.0–36.0)
MCV: 83.5 fL (ref 80.0–100.0)
Platelets: 206 10*3/uL (ref 150–400)
RBC: 2.67 MIL/uL — ABNORMAL LOW (ref 3.87–5.11)
RDW: 22.2 % — ABNORMAL HIGH (ref 11.5–15.5)
WBC: 8.3 10*3/uL (ref 4.0–10.5)
nRBC: 0.4 % — ABNORMAL HIGH (ref 0.0–0.2)

## 2022-06-29 LAB — BASIC METABOLIC PANEL
Anion gap: 5 (ref 5–15)
BUN: 23 mg/dL (ref 8–23)
CO2: 17 mmol/L — ABNORMAL LOW (ref 22–32)
Calcium: 7.7 mg/dL — ABNORMAL LOW (ref 8.9–10.3)
Chloride: 123 mmol/L — ABNORMAL HIGH (ref 98–111)
Creatinine, Ser: 1.24 mg/dL — ABNORMAL HIGH (ref 0.44–1.00)
GFR, Estimated: 46 mL/min — ABNORMAL LOW (ref >60)
Glucose, Bld: 85 mg/dL (ref 70–99)
Potassium: 3.3 mmol/L — ABNORMAL LOW (ref 3.5–5.1)
Sodium: 145 mmol/L (ref 135–145)

## 2022-06-29 LAB — TYPE AND SCREEN: Antibody Screen: NEGATIVE

## 2022-06-29 LAB — CULTURE, BLOOD (ROUTINE X 2)
Special Requests: ADEQUATE
Special Requests: ADEQUATE

## 2022-06-29 LAB — BPAM RBC: Blood Product Expiration Date: 202406292359

## 2022-06-29 LAB — MAGNESIUM: Magnesium: 1.6 mg/dL — ABNORMAL LOW (ref 1.7–2.4)

## 2022-06-29 LAB — PREPARE RBC (CROSSMATCH)

## 2022-06-29 MED ORDER — MAGNESIUM SULFATE 2 GM/50ML IV SOLN
2.0000 g | Freq: Once | INTRAVENOUS | Status: AC
  Administered 2022-06-29: 2 g via INTRAVENOUS
  Filled 2022-06-29: qty 50

## 2022-06-29 MED ORDER — DEXTROSE IN LACTATED RINGERS 5 % IV SOLN: INTRAVENOUS | Status: AC

## 2022-06-29 MED ORDER — GERHARDT'S BUTT CREAM
TOPICAL_CREAM | Freq: Four times a day (QID) | CUTANEOUS | Status: DC | PRN
  Filled 2022-06-29 (×2): qty 1

## 2022-06-29 MED ORDER — POTASSIUM CHLORIDE 20 MEQ PO PACK: 40.0000 meq | PACK | Freq: Once | ORAL | Status: DC

## 2022-06-29 MED ORDER — SODIUM CHLORIDE 0.9% IV SOLUTION: Freq: Once | INTRAVENOUS | Status: DC

## 2022-06-29 MED ORDER — POTASSIUM CHLORIDE CRYS ER 20 MEQ PO TBCR
40.0000 meq | EXTENDED_RELEASE_TABLET | Freq: Once | ORAL | Status: AC
  Administered 2022-06-29: 40 meq via ORAL
  Filled 2022-06-29: qty 2

## 2022-06-29 NOTE — Consult Note (Signed)
PHARMACY CONSULT NOTE  Pharmacy Consult for Electrolyte Monitoring and Replacement   Recent Labs: Potassium (mmol/L)  Date Value  06/29/2022 3.3 (L)  10/13/2013 3.0 (L)   Magnesium (mg/dL)  Date Value  16/11/9602 1.6 (L)  03/09/2013 2.3   Calcium (mg/dL)  Date Value  54/10/8117 7.7 (L)   Calcium, Total (mg/dL)  Date Value  14/78/2956 9.3   Albumin (g/dL)  Date Value  21/30/8657 3.2 (L)  10/12/2020 3.9  09/15/2013 3.3 (L)   Phosphorus (mg/dL)  Date Value  84/69/6295 4.1   Sodium  Date Value  06/29/2022 145 mmol/L  07/18/2020 CANCELED  10/07/2013 142 mmol/L   Assessment: 72 yo female presented to the ED with diarrhea and weakness.  Patient admitted for treatment of AKI, diarrhea and electrolyte abnormalities.  Pharmacy consulted for electrolyte replacement.  AKI resolving (Scr 7.19 >> 1.24)  MIVF: D5LR at 100 cc/hr  Goal of Therapy:  Within normal limits  Plan:  --K 3.3, Kcl 40 mEq PO x 1 --Mg 1.6, magnesium sulfate 2 g IV x 1 --Follow all electrolytes with AM labs and replace as needed.  Tressie Ellis 06/29/2022 7:30 AM

## 2022-06-29 NOTE — Progress Notes (Addendum)
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  Upon admission she was found to be hypotensive requiring IV fluids and midodrine.  Also found to be significantly dehydrated with hypokalemia and acute kidney injury with metabolic acidosis.  CT scan was unremarkable.  Renal ultrasound was unremarkable.    Assessment/Plan:    Acute kidney injury -Metabolic acidosis; in setting of severe dehydration -Baseline creatinine 1.0 ;as of 06/05/2022 -Presented with creatinine of 7.19, improved to 1.23 -Started on D5 half-normal saline along with sodium bicarb infusion -IV fluids were changed to  D5 LR at 75  mL/h -CT abdomen/pelvis unremarkable, renal ultrasound negative -Foley catheter placed, continue to monitor urine output   Diarrhea -Resolved; likely gastroenteritis -CT abdomen/pelvis was unremarkable -GI pathogen panel and C. difficile is negative  UTI -Started on IV Rocephin; treat for total 5 days -Stop date 5/26  Abnormal kappa lambda light chain ratio -Hematology consulted -Multiple myeloma panel ordered  Anemia -Unclear etiology -Hemoglobin is 7.1 this morning -Check stool for occult blood; currently pending -S/p 1 unit PRBC -Will transfuse another unit of PRBC as patient continues to be lethargic with very poor p.o. intake  Orthostatic hypotension -Unclear etiology -BP still soft, continue midodrine -Cortisol level normal at 26.7    Chronic congestive heart failure (HCC) Biventricular implantable  cardioverter-defibrillator-St. Jude's NSVT (nonsustained ventricular tachycardia) (HCC) -Currently dehydrated.  Closely monitor.    Chronic obstructive pulmonary disease, unspecified COPD type (HCC) Chronic respiratory failure with hypoxia (HCC) -Bronchodilators.  On scheduled Breo    Type 2 diabetes mellitus with hypoglycemia, unspecified whether long term insulin use (HCC) -A1c is 5.9.   -Currently low blood glucose.   -Closely monitor    Dementia with behavioral disturbance (HCC) -Continue donepezil and Namenda    Seizures (HCC) - Continue home keppra    Hypothyroidism -Low TSH, slightly high ft4.  -Reduce Synthroid .  -Repeat levels in 4 weeks outpatient  Poor p.o. intake -As per patient husband; she has had poor p.o. intake for a while -Will consult dietitian for nutritional assessment  Hypokalemia -Potassium is 3.3 this morning -Replace potassium and follow BMP in am     Medications     atorvastatin  80 mg Oral Daily   Chlorhexidine Gluconate Cloth  6 each Topical Daily   clopidogrel  75 mg Oral Daily   fluticasone furoate-vilanterol  1 puff Inhalation Daily   folic acid  1 mg Oral Daily   heparin injection (subcutaneous)  5,000 Units Subcutaneous Q8H   insulin aspart  0-9 Units Subcutaneous Q4H   levETIRAcetam  750 mg Oral BID   levothyroxine  88 mcg Oral Q0600   liver oil-zinc oxide   Topical BID   midodrine  10 mg Oral TID WC   mirtazapine  7.5 mg Oral BID   nystatin cream   Topical BID   potassium chloride  40 mEq Oral Once   sodium chloride flush  3 mL Intravenous Q12H     Data Reviewed:   CBG:  Recent Labs  Lab 06/28/22 1651 06/28/22 2040 06/29/22 0006 06/29/22 0407 06/29/22 0743  GLUCAP 95 74  93 83 91    SpO2: 100 % O2 Flow Rate (L/min): 4 L/min    Vitals:   06/28/22 1804 06/28/22 1954 06/29/22 0412 06/29/22 0741  BP: (!) 96/48 (!) 97/56 121/61 (!) 104/50  Pulse: 73 75 78 84  Resp: 18 18 18 18   Temp: 98.5 F (36.9 C) 97.9  F (36.6 C) 98 F (36.7 C) 98.2 F (36.8 C)  TempSrc:  Oral    SpO2: 100% 99% 96% 100%  Weight:          Data Reviewed:  Basic Metabolic Panel: Recent Labs  Lab 06/24/22 1318 06/24/22 2204 06/25/22 0919 06/25/22 1427 06/26/22 0850 06/27/22 0631 06/27/22 1456 06/28/22 0908 06/29/22 0445  NA 129*   < >  --  129* 135 139  --  140 145  K 2.6*   < >  --  3.0* 3.9 2.9* 3.3* 3.8 3.3*  CL 91*   < >  --  102 112* 114*  --  118* 123*  CO2 17*   < >  --  13* 14* 16*  --  16* 17*  GLUCOSE 122*   < >  --  94 91 88  --  97 85  BUN 71*   < >  --  57* 50* 42*  --  31* 23  CREATININE 7.19*   < >  --  5.14* 3.89* 2.98*  --  1.83* 1.24*  CALCIUM 7.5*   < >  --  7.4* 7.2* 7.7*  --  7.6* 7.7*  MG 1.6*  --   --   --  1.7 1.6*  --  1.8 1.6*  PHOS  --   --  4.1  --   --   --   --   --   --    < > = values in this interval not displayed.    CBC: Recent Labs  Lab 06/24/22 1318 06/24/22 2204 06/25/22 0415 06/25/22 0919 06/26/22 0850 06/27/22 0631 06/28/22 0908 06/29/22 0445  WBC 14.5*   < > 13.2*  --  12.0* 10.3 9.1 8.3  NEUTROABS 12.5*  --   --   --   --   --   --   --   HGB 9.3*   < > 6.3* 6.3* 8.1* 8.0* 7.1* 7.1*  HCT 30.4*   < > 20.8* 20.4* 25.7* 24.7* 22.5* 22.3*  MCV 80.9   < > 83.2  --  86.5 82.9 84.3 83.5  PLT PLATELET CLUMPS NOTED ON SMEAR, UNABLE TO ESTIMATE   < > 315  --  292 269 202 206   < > = values in this interval not displayed.    LFT Recent Labs  Lab 06/24/22 1318  AST 13*  ALT <5  ALKPHOS 169*  BILITOT 0.5  PROT 9.0*  ALBUMIN 3.2*     Antibiotics: Anti-infectives (From admission, onward)    Start     Dose/Rate Route Frequency Ordered Stop   06/25/22 1400  cefTRIAXone (ROCEPHIN) 1 g in sodium chloride 0.9 % 100 mL IVPB        1 g 200 mL/hr over 30 Minutes Intravenous Every 24 hours 06/25/22 1229 06/30/22 1359   06/24/22 1315  ceFEPIme (MAXIPIME) 2 g in sodium chloride 0.9 % 100 mL IVPB        2 g 200 mL/hr over 30 Minutes Intravenous  Once  06/24/22 1313 06/24/22 1633   06/24/22 1315  metroNIDAZOLE (FLAGYL) IVPB 500 mg  500 mg 100 mL/hr over 60 Minutes Intravenous  Once 06/24/22 1313 06/24/22 1716   06/24/22 1315  vancomycin (VANCOCIN) IVPB 1000 mg/200 mL premix        1,000 mg 200 mL/hr over 60 Minutes Intravenous  Once 06/24/22 1313 06/24/22 1833        DVT prophylaxis: Heparin  Code Status: Full code  Family Communication: Discussed with patient's husband at bedside   CONSULTS Hematology   Subjective   Continues to be lethargic.  Objective    Physical Examination:  Appears lethargic S1-S2, regular Lungs clear to auscultation bilaterally Abdomen is soft, nontender, no organomegaly Extremities no edema in the lower extremities  Status is: Inpatient:      Pressure Injury 06/25/22 Buttocks Right;Left;Medial Stage 2 -  Partial thickness loss of dermis presenting as a shallow open injury with a red, pink wound bed without slough. (Active)  06/25/22 0423  Location: Buttocks  Location Orientation: Right;Left;Medial  Staging: Stage 2 -  Partial thickness loss of dermis presenting as a shallow open injury with a red, pink wound bed without slough.  Wound Description (Comments):   Present on Admission: Yes        Rini Moffit S Rolfe Hartsell   [facility] If 7PM-7AM, please contact night-coverage at [email or web address], Office  [phone]   06/29/2022, 8:05 AM  LOS: 5 days

## 2022-06-29 NOTE — TOC Progression Note (Addendum)
Transition of Care [facility]) - Progression Note    Patient Details  Name: Stacy Grimes MRN: [id number] Date of Birth: 11-30-1950  [facility] of [facility]) CM/SW Contact  Liliana Cline, LCSW Phone Number: 06/29/2022, 10:35 AM  Clinical Narrative:    Called patient's spouse to discuss SNF rec. He states they are not interested in SNF, he wants patient to continue with [facility] [place]. He feels he can meet patient's needs at home.   Expected Discharge Plan: Home w Home Health Services Barriers to Discharge: Continued Medical Work up  Expected Discharge Plan and Services       Living arrangements for the past 2 months: Single Family Home                                       Social Determinants of Health (SDOH) Interventions SDOH Screenings   Food Insecurity: No Food Insecurity (06/10/2022)  Housing: Patient Unable To Answer (06/10/2022)  Transportation Needs: No Transportation Needs (06/10/2022)  Utilities: Not At Risk (03/07/2022)  Depression (PHQ2-9): Low Risk  (09/01/2019)  Tobacco Use: Medium Risk (06/26/2022)    Readmission Risk Interventions    06/28/2022    2:46 PM 04/12/2022    1:27 PM 03/08/2022    1:45 PM  Readmission Risk Prevention Plan  Transportation Screening Complete Complete Complete  Medication Review Oceanographer) Complete Complete Complete  PCP or Specialist appointment within 3-5 days of discharge Complete Complete Complete  HRI or Home Care Consult  Complete Complete  SW Recovery Care/Counseling Consult Complete Complete Complete  Palliative Care Screening Not Applicable Not Complete   Skilled Nursing Facility Not Applicable Not Complete Complete

## 2022-06-30 DIAGNOSIS — N179 Acute kidney failure, unspecified: Secondary | ICD-10-CM | POA: Diagnosis not present

## 2022-06-30 DIAGNOSIS — G9341 Metabolic encephalopathy: Secondary | ICD-10-CM | POA: Diagnosis not present

## 2022-06-30 DIAGNOSIS — R531 Weakness: Secondary | ICD-10-CM | POA: Diagnosis not present

## 2022-06-30 DIAGNOSIS — E876 Hypokalemia: Secondary | ICD-10-CM | POA: Diagnosis not present

## 2022-06-30 LAB — HAPTOGLOBIN: Haptoglobin: 131 mg/dL (ref 42–346)

## 2022-06-30 LAB — GLUCOSE, CAPILLARY
Glucose-Capillary: 106 mg/dL — ABNORMAL HIGH (ref 70–99)
Glucose-Capillary: 79 mg/dL (ref 70–99)
Glucose-Capillary: 81 mg/dL (ref 70–99)
Glucose-Capillary: 81 mg/dL (ref 70–99)
Glucose-Capillary: 82 mg/dL (ref 70–99)
Glucose-Capillary: 82 mg/dL (ref 70–99)
Glucose-Capillary: 88 mg/dL (ref 70–99)

## 2022-06-30 LAB — CBC
HCT: 26.7 % — ABNORMAL LOW (ref 36.0–46.0)
Hemoglobin: 8.7 g/dL — ABNORMAL LOW (ref 12.0–15.0)
MCH: 27.6 pg (ref 26.0–34.0)
MCHC: 32.6 g/dL (ref 30.0–36.0)
MCV: 84.8 fL (ref 80.0–100.0)
Platelets: 174 10*3/uL (ref 150–400)
RBC: 3.15 MIL/uL — ABNORMAL LOW (ref 3.87–5.11)
RDW: 19.9 % — ABNORMAL HIGH (ref 11.5–15.5)
WBC: 6.9 10*3/uL (ref 4.0–10.5)
nRBC: 0.3 % — ABNORMAL HIGH (ref 0.0–0.2)

## 2022-06-30 LAB — BASIC METABOLIC PANEL
Anion gap: 4 — ABNORMAL LOW (ref 5–15)
BUN: 16 mg/dL (ref 8–23)
CO2: 19 mmol/L — ABNORMAL LOW (ref 22–32)
Calcium: 7.5 mg/dL — ABNORMAL LOW (ref 8.9–10.3)
Chloride: 122 mmol/L — ABNORMAL HIGH (ref 98–111)
Creatinine, Ser: 1.04 mg/dL — ABNORMAL HIGH (ref 0.44–1.00)
GFR, Estimated: 57 mL/min — ABNORMAL LOW (ref >60)
Glucose, Bld: 85 mg/dL (ref 70–99)
Potassium: 3.6 mmol/L (ref 3.5–5.1)
Sodium: 145 mmol/L (ref 135–145)

## 2022-06-30 LAB — BPAM RBC
ISSUE DATE / TIME: 202405251630
Unit Type and Rh: 5100

## 2022-06-30 LAB — TYPE AND SCREEN
ABO/RH(D): O POS
Unit division: 0

## 2022-06-30 LAB — MAGNESIUM: Magnesium: 1.9 mg/dL (ref 1.7–2.4)

## 2022-06-30 MED ORDER — ZINC SULFATE 220 (50 ZN) MG PO CAPS
220.0000 mg | ORAL_CAPSULE | Freq: Every day | ORAL | Status: DC
  Administered 2022-06-30 – 2022-07-06 (×7): 220 mg via ORAL
  Filled 2022-06-30 (×7): qty 1

## 2022-06-30 MED ORDER — VITAMIN C 500 MG PO TABS
500.0000 mg | ORAL_TABLET | Freq: Every day | ORAL | Status: DC
  Administered 2022-06-30 – 2022-07-01 (×2): 500 mg via ORAL
  Filled 2022-06-30 (×2): qty 1

## 2022-06-30 MED ORDER — GLUCERNA SHAKE PO LIQD
237.0000 mL | Freq: Three times a day (TID) | ORAL | Status: DC
  Administered 2022-06-30 – 2022-07-01 (×3): 237 mL via ORAL

## 2022-06-30 MED ORDER — MEGESTROL ACETATE 400 MG/10ML PO SUSP
400.0000 mg | Freq: Once | ORAL | Status: AC
  Administered 2022-06-30: 400 mg via ORAL
  Filled 2022-06-30: qty 10

## 2022-06-30 MED ORDER — JUVEN PO PACK: 1.0000 | PACK | Freq: Two times a day (BID) | ORAL | Status: DC

## 2022-06-30 NOTE — Consult Note (Signed)
PHARMACY CONSULT NOTE  Pharmacy Consult for Electrolyte Monitoring and Replacement   Recent Labs: Potassium (mmol/L)  Date Value  06/30/2022 3.6  10/13/2013 3.0 (L)   Magnesium (mg/dL)  Date Value  16/11/9602 1.9  03/09/2013 2.3   Calcium (mg/dL)  Date Value  54/10/8117 7.5 (L)   Calcium, Total (mg/dL)  Date Value  14/78/2956 9.3   Albumin (g/dL)  Date Value  21/30/8657 3.2 (L)  10/12/2020 3.9  09/15/2013 3.3 (L)   Phosphorus (mg/dL)  Date Value  84/69/6295 4.1   Sodium  Date Value  06/30/2022 145 mmol/L  07/18/2020 CANCELED  10/07/2013 142 mmol/L   Assessment: 72 yo female presented to the ED with diarrhea and weakness.  Patient admitted for treatment of AKI, diarrhea and electrolyte abnormalities.  Pharmacy consulted for electrolyte replacement.  AKI resolving (Scr 7.19 >> 1.04)  MIVF: D5LR at 100 >> 75 cc/hr  Goal of Therapy:  Within normal limits  Plan:  --No electrolyte replacement indicated at this time --Follow all electrolytes with AM labs and replace as needed  Tressie Ellis 06/30/2022 7:25 AM

## 2022-06-30 NOTE — Progress Notes (Signed)
Initial Nutrition Assessment  DOCUMENTATION CODES:   Not applicable  INTERVENTION:  - Add Glucerna Shake po TID, each supplement provides 220 kcal and 10 grams of protein  - Add -1 packet Juven BID, each packet provides 95 calories, 2.5 grams of protein (collagen), and 9.8 grams of carbohydrate (3 grams sugar); also contains 7 grams of L-arginine and L-glutamine, 300 mg vitamin C, 15 mg vitamin E, 1.2 mcg vitamin B-12, 9.5 mg zinc, 200 mg calcium, and 1.5 g  Calcium Beta-hydroxy-Beta-methylbutyrate to support wound healing  - Add Vit C q day.   - Add Zinc x14 days.   NUTRITION DIAGNOSIS:   Inadequate oral intake related to poor appetite as evidenced by meal completion < 50%.  GOAL:   Patient will meet greater than or equal to 90% of their needs  MONITOR:   PO intake, Supplement acceptance  REASON FOR ASSESSMENT:   Consult Assessment of nutrition requirement/status  ASSESSMENT:   72 y.o. female admits related to diarrhea and fatigue. PMH includes: CHF, COPD, PAH, dementia, HTN, hypothyroidism, T2DM. Pt is currently receiving medical management related to AKI.  Meds reviewed: lipitor, folic acid, sliding scale insulin, remeron. Labs reviewed: WDL.   The pt is currently oriented x2. Per record, pt ate 50% of her lunch today. Pt has had poor PO intakes since admission. Pt also with increased nutrient needs related to wound healing. RD will add Glucerna with meals. Will also add Juven BID as well as Vit C and Zinc. RD will continue to monitor PO intakes.   NUTRITION - FOCUSED PHYSICAL EXAM:  Remote assessment.   Diet Order:   Diet Order             DIET SOFT Room service appropriate? Yes; Fluid consistency: Thin  Diet effective now                   EDUCATION NEEDS:   Not appropriate for education at this time  Skin:  Skin Assessment: Skin Integrity Issues: Skin Integrity Issues:: Stage II Stage II: R buttocks  Last BM:  5/25 - type 6  Height:   Ht  Readings from Last 1 Encounters:  04/11/22 5\' 3"  (1.6 m)    Weight:   Wt Readings from Last 1 Encounters:  06/24/22 61.2 kg    Ideal Body Weight:     BMI:  Body mass index is 23.91 kg/m.  Estimated Nutritional Needs:   Kcal:  1530-1835 kcals  Protein:  75-90 gm  Fluid:  1.5-1.8 L  Bethann Humble, RD, LDN, CNSC.

## 2022-06-30 NOTE — Progress Notes (Signed)
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  Upon admission she was found to be hypotensive requiring IV fluids and midodrine.  Also found to be significantly dehydrated with hypokalemia and acute kidney injury with metabolic acidosis.  CT scan was unremarkable.  Renal ultrasound was unremarkable.    Assessment/Plan:    Acute kidney injury -Metabolic acidosis; in setting of severe dehydration -Baseline creatinine 1.0 ;as of 06/05/2022 -Presented with creatinine of 7.19, improved to 1.04 -Started on D5 half-normal saline along with sodium bicarb infusion -IV fluids were changed to  D5 LR at 75  mL/h -CT abdomen/pelvis unremarkable, renal ultrasound negative -Foley catheter placed, continue to monitor urine output   Diarrhea -Resolved; likely gastroenteritis -CT abdomen/pelvis was unremarkable -GI pathogen panel and C. difficile is negative  UTI -Started on IV Rocephin; treat for total 5 days -Stop date 5/26  Abnormal kappa lambda light chain ratio -Hematology consulted -Multiple myeloma panel ordered  Anemia -Unclear etiology -Hemoglobin is 7.1 this morning -Check stool for occult blood; currently pending -S/p 2 unit PRBC; Hb is up to 8.7   Orthostatic hypotension -Unclear etiology -BP still soft, continue midodrine -Cortisol level normal at 26.7    Chronic congestive heart failure (HCC) 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 NSVT (nonsustained ventricular tachycardia)  (HCC) -Currently dehydrated.  Closely monitor.    Chronic obstructive pulmonary disease, unspecified COPD type (HCC) Chronic respiratory failure with hypoxia (HCC) -Bronchodilators.  On scheduled Breo    Type 2 diabetes mellitus with hypoglycemia, unspecified whether long term insulin use (HCC) -A1c is 5.9.   -Currently low blood glucose.   -Closely monitor    Dementia with behavioral disturbance (HCC) -Continue donepezil and Namenda    Seizures (HCC) - Continue home keppra    Hypothyroidism -Low TSH, slightly high ft4.  -Reduce Synthroid .  -Repeat levels in 4 weeks outpatient  Poor p.o. intake/malnutrition -As per patient husband; she has had poor p.o. intake for a while -Will consult dietitian for nutritional assessment  Hypokalemia -Replete     Medications     sodium chloride   Intravenous Once   atorvastatin  80 mg Oral Daily   Chlorhexidine Gluconate Cloth  6 each Topical Daily   clopidogrel  75 mg Oral Daily   fluticasone furoate-vilanterol  1 puff Inhalation Daily   folic acid  1 mg Oral Daily   heparin injection (subcutaneous)  5,000 Units Subcutaneous Q8H   insulin aspart  0-9 Units Subcutaneous Q4H   levETIRAcetam  750 mg Oral BID   levothyroxine  88 mcg Oral Q0600   liver oil-zinc oxide   Topical BID   megestrol  400 mg Oral Once   midodrine  10 mg Oral TID WC   mirtazapine  7.5 mg Oral BID   nystatin cream   Topical BID   sodium chloride flush  3 mL Intravenous Q12H     Data Reviewed:   CBG:  Recent Labs  Lab 06/29/22 1610 06/29/22 2157 06/30/22 0038 06/30/22 0439 06/30/22 0754  GLUCAP 85 88 82 81 88    SpO2: 100 % O2 Flow Rate (L/min): 4 L/min  Vitals:   06/29/22 1645 06/29/22 1925 06/30/22 0442 06/30/22 0753  BP: (!) 126/58 (!) 121/58 (!) 128/58 (!) 109/56  Pulse: 71 66 69 63  Resp: 17 16 20 16   Temp: 98.4 F (36.9 C) (!) 97.4 F (36.3 C) 97.8 F (36.6 C) 98.3 F (36.8 C)  TempSrc: Oral Oral Oral   SpO2: 100% 100%  100% 100%  Weight:          Data Reviewed:  Basic Metabolic Panel: Recent Labs  Lab 06/25/22 0919 06/25/22 1427 06/26/22 0850 06/27/22 0631 06/27/22 1456 06/28/22 0908 06/29/22 0445 06/30/22 0439  NA  --    < > 135 139  --  140 145 145  K  --    < > 3.9 2.9* 3.3* 3.8 3.3* 3.6  CL  --    < > 112* 114*  --  118* 123* 122*  CO2  --    < > 14* 16*  --  16* 17* 19*  GLUCOSE  --    < > 91 88  --  97 85 85  BUN  --    < > 50* 42*  --  31* 23 16  CREATININE  --    < > 3.89* 2.98*  --  1.83* 1.24* 1.04*  CALCIUM  --    < > 7.2* 7.7*  --  7.6* 7.7* 7.5*  MG  --   --  1.7 1.6*  --  1.8 1.6* 1.9  PHOS 4.1  --   --   --   --   --   --   --    < > = values in this interval not displayed.    CBC: Recent Labs  Lab 06/24/22 1318 06/24/22 2204 06/26/22 0850 06/27/22 0631 06/28/22 0908 06/29/22 0445 06/30/22 0439  WBC 14.5*   < > 12.0* 10.3 9.1 8.3 6.9  NEUTROABS 12.5*  --   --   --   --   --   --   HGB 9.3*   < > 8.1* 8.0* 7.1* 7.1* 8.7*  HCT 30.4*   < > 25.7* 24.7* 22.5* 22.3* 26.7*  MCV 80.9   < > 86.5 82.9 84.3 83.5 84.8  PLT PLATELET CLUMPS NOTED ON SMEAR, UNABLE TO ESTIMATE   < > 292 269 202 206 174   < > = values in this interval not displayed.    LFT Recent Labs  Lab 06/24/22 1318  AST 13*  ALT <5  ALKPHOS 169*  BILITOT 0.5  PROT 9.0*  ALBUMIN 3.2*     Antibiotics: Anti-infectives (From admission, onward)    Start     Dose/Rate Route Frequency Ordered Stop   06/25/22 1400  cefTRIAXone (ROCEPHIN) 1 g in sodium chloride 0.9 % 100 mL IVPB        1 g 200 mL/hr over 30 Minutes Intravenous Every 24 hours 06/25/22 1229 06/29/22 1347   06/24/22 1315  ceFEPIme (MAXIPIME) 2 g in sodium chloride 0.9 % 100 mL IVPB        2 g 200 mL/hr over 30 Minutes Intravenous  Once 06/24/22 1313 06/24/22 1633   06/24/22 1315  metroNIDAZOLE (FLAGYL) IVPB 500 mg        500 mg 100 mL/hr over 60 Minutes Intravenous  Once 06/24/22 1313 06/24/22 1716   06/24/22 1315  vancomycin  (VANCOCIN) IVPB 1000 mg/200 mL premix        1,000 mg 200 mL/hr over 60 Minutes Intravenous  Once 06/24/22 1313 06/24/22 1833  DVT prophylaxis: Heparin  Code Status: Full code  Family Communication: Discussed with patient's husband at bedside   CONSULTS Hematology   Subjective   Patient seen and examined, she is much more alert and communicative today.  Hemoglobin is up to 8.7 after 1 unit PRBC.  Objective    Physical Examination:  General-appears in no acute distress Heart-S1-S2, regular, no murmur auscultated Lungs-clear to auscultation bilaterally, no wheezing or crackles auscultated Abdomen-soft, nontender, no organomegaly Extremities-no edema in the lower extremities Neuro-alert, oriented x3, no focal deficit noted  Status is: Inpatient:      Pressure Injury 06/25/22 Buttocks Right;Left;Medial Stage 2 -  Partial thickness loss of dermis presenting as a shallow open injury with a red, pink wound bed without slough. (Active)  06/25/22 0423  Location: Buttocks  Location Orientation: Right;Left;Medial  Staging: Stage 2 -  Partial thickness loss of dermis presenting as a shallow open injury with a red, pink wound bed without slough.  Wound Description (Comments):   Present on Admission: Yes        Shakthi Scipio S Jamaine Quintin   [facility] If 7PM-7AM, please contact night-coverage at [email or web address], Office  [phone]   06/30/2022, 8:13 AM  LOS: 6 days

## 2022-07-01 DIAGNOSIS — E876 Hypokalemia: Secondary | ICD-10-CM | POA: Diagnosis not present

## 2022-07-01 DIAGNOSIS — E872 Acidosis, unspecified: Secondary | ICD-10-CM | POA: Diagnosis not present

## 2022-07-01 DIAGNOSIS — N179 Acute kidney failure, unspecified: Secondary | ICD-10-CM | POA: Diagnosis not present

## 2022-07-01 DIAGNOSIS — E861 Hypovolemia: Secondary | ICD-10-CM | POA: Diagnosis not present

## 2022-07-01 LAB — BASIC METABOLIC PANEL
Anion gap: 5 (ref 5–15)
Anion gap: 8 (ref 5–15)
BUN: 11 mg/dL (ref 8–23)
BUN: 10 mg/dL (ref 8–23)
CO2: 19 mmol/L — ABNORMAL LOW (ref 22–32)
CO2: 20 mmol/L — ABNORMAL LOW (ref 22–32)
Calcium: 7.4 mg/dL — ABNORMAL LOW (ref 8.9–10.3)
Calcium: 7.8 mg/dL — ABNORMAL LOW (ref 8.9–10.3)
Chloride: 121 mmol/L — ABNORMAL HIGH (ref 98–111)
Chloride: 117 mmol/L — ABNORMAL HIGH (ref 98–111)
Creatinine, Ser: 0.85 mg/dL (ref 0.44–1.00)
Creatinine, Ser: 0.88 mg/dL (ref 0.44–1.00)
GFR, Estimated: >60 mL/min (ref >60)
GFR, Estimated: >60 mL/min (ref >60)
Glucose, Bld: 119 mg/dL — ABNORMAL HIGH (ref 70–99)
Glucose, Bld: 95 mg/dL (ref 70–99)
Potassium: 3 mmol/L — ABNORMAL LOW (ref 3.5–5.1)
Potassium: 3.9 mmol/L (ref 3.5–5.1)
Sodium: 145 mmol/L (ref 135–145)
Sodium: 145 mmol/L (ref 135–145)

## 2022-07-01 LAB — GLUCOSE, CAPILLARY
Glucose-Capillary: 70 mg/dL (ref 70–99)
Glucose-Capillary: 83 mg/dL (ref 70–99)
Glucose-Capillary: 84 mg/dL (ref 70–99)
Glucose-Capillary: 84 mg/dL (ref 70–99)
Glucose-Capillary: 71 mg/dL (ref 70–99)

## 2022-07-01 LAB — CBC
HCT: 28.4 % — ABNORMAL LOW (ref 36.0–46.0)
Hemoglobin: 9.2 g/dL — ABNORMAL LOW (ref 12.0–15.0)
MCH: 27.4 pg (ref 26.0–34.0)
MCHC: 32.4 g/dL (ref 30.0–36.0)
MCV: 84.5 fL (ref 80.0–100.0)
Platelets: 153 10*3/uL (ref 150–400)
RBC: 3.36 MIL/uL — ABNORMAL LOW (ref 3.87–5.11)
RDW: 20.6 % — ABNORMAL HIGH (ref 11.5–15.5)
WBC: 7.5 10*3/uL (ref 4.0–10.5)
nRBC: 0.4 % — ABNORMAL HIGH (ref 0.0–0.2)

## 2022-07-01 LAB — MAGNESIUM
Magnesium: 1.6 mg/dL — ABNORMAL LOW (ref 1.7–2.4)
Magnesium: 1.7 mg/dL (ref 1.7–2.4)

## 2022-07-01 MED ORDER — MIDODRINE HCL 5 MG PO TABS
5.0000 mg | ORAL_TABLET | Freq: Three times a day (TID) | ORAL | Status: DC
  Administered 2022-07-02 – 2022-07-06 (×10): 5 mg via ORAL
  Filled 2022-07-01 (×13): qty 1

## 2022-07-01 MED ORDER — ENSURE ENLIVE PO LIQD
237.0000 mL | Freq: Three times a day (TID) | ORAL | Status: DC
  Administered 2022-07-01 – 2022-07-06 (×12): 237 mL via ORAL

## 2022-07-01 MED ORDER — ORAL CARE MOUTH RINSE: 15.0000 mL | OROMUCOSAL | Status: DC | PRN

## 2022-07-01 MED ORDER — MAGNESIUM SULFATE 2 GM/50ML IV SOLN
2.0000 g | Freq: Once | INTRAVENOUS | Status: AC
  Administered 2022-07-01: 2 g via INTRAVENOUS
  Filled 2022-07-01: qty 50

## 2022-07-01 MED ORDER — POTASSIUM CHLORIDE 10 MEQ/100ML IV SOLN
10.0000 meq | INTRAVENOUS | Status: DC
  Administered 2022-07-01: 10 meq via INTRAVENOUS
  Filled 2022-07-01: qty 100

## 2022-07-01 MED ORDER — ADULT MULTIVITAMIN W/MINERALS CH
1.0000 | ORAL_TABLET | Freq: Every day | ORAL | Status: DC
  Administered 2022-07-02 – 2022-07-06 (×4): 1 via ORAL
  Filled 2022-07-01 (×5): qty 1

## 2022-07-01 MED ORDER — MEGESTROL ACETATE 400 MG/10ML PO SUSP
400.0000 mg | Freq: Once | ORAL | Status: AC
  Administered 2022-07-01: 400 mg via ORAL
  Filled 2022-07-01: qty 10

## 2022-07-01 MED ORDER — PROSOURCE PLUS PO LIQD
30.0000 mL | Freq: Two times a day (BID) | ORAL | Status: DC
  Administered 2022-07-02 – 2022-07-06 (×8): 30 mL via ORAL
  Filled 2022-07-01: qty 30

## 2022-07-01 MED ORDER — CHOLESTYRAMINE LIGHT 4 G PO PACK
2.0000 g | PACK | Freq: Three times a day (TID) | ORAL | Status: DC
  Administered 2022-07-01 – 2022-07-06 (×7): 2 g via ORAL
  Filled 2022-07-01 (×17): qty 1

## 2022-07-01 MED ORDER — POTASSIUM CHLORIDE CRYS ER 20 MEQ PO TBCR
40.0000 meq | EXTENDED_RELEASE_TABLET | Freq: Once | ORAL | Status: AC
  Administered 2022-07-01: 40 meq via ORAL
  Filled 2022-07-01: qty 2

## 2022-07-01 MED ORDER — ORAL CARE MOUTH RINSE
15.0000 mL | OROMUCOSAL | Status: DC
  Administered 2022-07-01 – 2022-07-06 (×11): 15 mL via OROMUCOSAL

## 2022-07-01 MED ORDER — POTASSIUM CHLORIDE 20 MEQ PO PACK
40.0000 meq | PACK | Freq: Two times a day (BID) | ORAL | Status: AC
  Administered 2022-07-01 (×2): 40 meq via ORAL
  Filled 2022-07-01 (×2): qty 2

## 2022-07-01 MED ORDER — VITAMIN C 500 MG PO TABS
500.0000 mg | ORAL_TABLET | Freq: Two times a day (BID) | ORAL | Status: DC
  Administered 2022-07-01 – 2022-07-06 (×9): 500 mg via ORAL
  Filled 2022-07-01 (×10): qty 1

## 2022-07-01 NOTE — Progress Notes (Signed)
       CROSS COVER NOTE  NAME: [name] MRN: [id number] DOB : Mar 29, 1950 ATTENDING PHYSICIAN: Meredeth Ide, MD  Non Sustained Ventricular Tachycardia PMH: NSVT, ICD in situ Notified by RN that M(r)s Gutter had a 2 minute run of ventricular tachycardia. Patient was asymptomatic and hemodynamically unstable. - BMP, Mg, supplement for goal K >4 and Mg >2 - Consider re-initiating beta blockade, has been held in setting of hypotension - Continue tele monitoring  To reach the provider On-Call:   7AM- 7PM see care teams to locate the attending and reach out to them via [email or web address] [email or web address] 7PM-7AM contact night-coverage If you still have difficulty reaching the appropriate provider, please page the [facility] (Director on Call) for [facility] on amion for assistance  This document was prepared using Conservation officer, historic buildings and may include unintentional dictation errors.  Bishop Limbo DNP, MBA, FNP-BC, PMHNP-BC Nurse Practitioner [facility] Pager [phone]

## 2022-07-01 NOTE — Care Management Important Message (Signed)
Important Message  Patient Details  Name: Stacy Grimes MRN: [id number] Date of Birth: 1950/12/18   Medicare Important Message Given:  Yes     Stacy Grimes 07/01/2022, 12:58 PM

## 2022-07-01 NOTE — Progress Notes (Signed)
Nutrition Follow-up  DOCUMENTATION CODES:   Not applicable  INTERVENTION:   -D/c Glucerna -D/c Juven -MVI with minerals daily -500 gm vitamin C BID -220 mg zinc sulfate x 14 days -Ensure Enlive po TID, each supplement provides 350 kcal and 20 grams of protein -30 ml Prosource Plus BID, each supplement provides 100 kcals and 15 grams protein -Feeding assistance with meals -Magic cup TID with meals, each supplement provides 290 kcal and 9 grams of protein   NUTRITION DIAGNOSIS:   Increased nutrient needs related to wound healing as evidenced by estimated needs.  Ongoing  GOAL:   Patient will meet greater than or equal to 90% of their needs  Unmet  MONITOR:   PO intake, Supplement acceptance  REASON FOR ASSESSMENT:   Consult Assessment of nutrition requirement/status  ASSESSMENT:   72 y.o. female admits related to diarrhea and fatigue. PMH includes: CHF, COPD, PAH, dementia, HTN, hypothyroidism, T2DM. Pt is currently receiving medical management related to AKI.  Reviewed I/O's: +30 ml x 24 hours and +3.5 L since admission  UOP: 250 ml x 24 hours  Spoke with pt at bedside, who was unable to provide accurate history. Pt replied to most questions with "I don't know, honey" or "I'm not hungry". Pt consumed a few sips of apple juice. All other part of tray (including cupcake, potato chips, hamburger, and broccoli) were untouched. Noted meal completions 0-50%.    Noted pt was on a dysphagia 2 diet on previous admission. Pt unable to state if she thought mechanically altered diet was helpful.   Reviewed wt hx; pt has experienced a 1.9% wt loss over the past 4 months, which is not significant for time frame.   Per previous palliative care discussions, pt family has declined offer of feeding tube in the past.   Pt with increased nutritional needs for wound healing and would benefit from addition of oral nutrition supplements.   Medications reviewed and include vitamin C,  folic acid, keppra, potassium chloride, and zinc sulfate.   Lab Results  Component Value Date   HGBA1C 5.9 (H) 06/24/2022   PTA DM medications are none.   Labs reviewed: K: 3, CBGS: 83-84 (inpatient orders for glycemic control are 0-9 units inuslin aspart every 4 hours).    NUTRITION - FOCUSED PHYSICAL EXAM:  Flowsheet Row Most Recent Value  Orbital Region No depletion  Upper Arm Region Moderate depletion  Thoracic and Lumbar Region No depletion  Buccal Region No depletion  Temple Region No depletion  Clavicle Bone Region Mild depletion  Clavicle and Acromion Bone Region Moderate depletion  Scapular Bone Region Moderate depletion  Dorsal Hand No depletion  Patellar Region No depletion  Anterior Thigh Region No depletion  Posterior Calf Region No depletion  Edema (RD Assessment) None  Hair Reviewed  Eyes Reviewed  Mouth Reviewed  Skin Reviewed  Nails Reviewed       Diet Order:   Diet Order             DIET DYS 3 Fluid consistency: Thin  Diet effective now                   EDUCATION NEEDS:   Not appropriate for education at this time  Skin:  Skin Assessment: Skin Integrity Issues: Skin Integrity Issues:: Stage II Stage II: R buttocks  Last BM:  07/01/22 (type 6)  Height:   Ht Readings from Last 1 Encounters:  04/11/22 5\' 3"  (1.6 m)    Weight:  Wt Readings from Last 1 Encounters:  06/24/22 61.2 kg    Ideal Body Weight:  52.3 kg  BMI:  Body mass index is 23.91 kg/m.  Estimated Nutritional Needs:   Kcal:  1650-1850  Protein:  90-105 grams  Fluid:  > 1.6 L    Levada Schilling, RD, LDN, CDCES Registered Dietitian II Certified Diabetes Care and Education Specialist Please refer to [facility] for RD and/or RD on-call/weekend/after hours pager

## 2022-07-01 NOTE — Progress Notes (Addendum)
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  Upon admission she was found to be hypotensive requiring IV fluids and midodrine.  Also found to be significantly dehydrated with hypokalemia and acute kidney injury with metabolic acidosis.  CT scan was unremarkable.  Renal ultrasound was unremarkable.    Assessment/Plan:    Acute kidney injury -Resolved -Metabolic acidosis; in setting of severe dehydration from chronic diarrhea -Baseline creatinine 1.0 ;as of 06/05/2022 -Presented with creatinine of 7.19, improved to 1.04 -Started on D5 half-normal saline along with sodium bicarb infusion -IV fluids were changed to  D5 LR at 75  mL/h -CT abdomen/pelvis unremarkable, renal ultrasound negative -Foley catheter placed, continue to monitor urine output   Diarrhea; chronic -Patient has been says that patient has current diarrhea, CT abdomen pelvis was unremarkable -CT abdomen/pelvis was unremarkable -GI pathogen panel and C. difficile is negative -Will start Questran 2 g p.o. 3 times daily and assess response  UTI -Started on IV Rocephin; treat for total 5 days -Stop date 5/26  Abnormal kappa lambda light chain ratio -Hematology consulted -Multiple myeloma panel ordered  Anemia -Unclear etiology -Hemoglobin is 7.1 this morning -Check stool for occult blood; currently pending -S/p 2 unit PRBC; Hb is up to 9.2  Hypokalemia -Potassium is 3.0 this morning -Will replace potassium and follow BMP in am   Orthostatic  hypotension -Resolved -BP was soft, she was started on midodrine 10 mg p.o. 3 times daily -Will cut down the dose of midodrine to 5 mg p.o. 3 times daily and monitor -Cortisol level normal at 26.7    Chronic congestive heart failure (HCC) 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 NSVT (nonsustained ventricular tachycardia) (HCC)   Closely monitor.    Chronic obstructive pulmonary disease, unspecified COPD type (HCC) Chronic respiratory failure with hypoxia (HCC) -Bronchodilators.  On scheduled Breo    Type 2 diabetes mellitus with hypoglycemia, unspecified whether long term insulin use (HCC) -A1c is 5.9.   -Currently low blood glucose.   -Closely monitor    Dementia with behavioral disturbance (HCC) -Continue donepezil and Namenda    Seizures (HCC) - Continue home keppra    Hypothyroidism -Low TSH, slightly high ft4.  -Reduce Synthroid .  -Repeat levels in 4 weeks outpatient  Poor p.o. intake/malnutrition -As per patient husband; she has had poor p.o. intake for a while -Dietitian consulted, started on supplementation -Was given 1 dose of Megace 400 milligram p.o. 2 days ago -Will give additional dose of Megace 400 mg p.o. x 1  Hypomagnesemia -Magnesium is 1.6, replace magnesium and follow mag level in a.m.     Medications     sodium chloride   Intravenous Once   ascorbic acid  500 mg Oral Daily   atorvastatin  80 mg Oral Daily   Chlorhexidine Gluconate Cloth  6 each Topical Daily   clopidogrel  75 mg Oral Daily   feeding supplement (GLUCERNA SHAKE)  237 mL Oral TID BM   fluticasone furoate-vilanterol  1 puff Inhalation Daily   folic acid  1 mg Oral Daily   heparin injection (subcutaneous)  5,000 Units Subcutaneous Q8H  insulin aspart  0-9 Units Subcutaneous Q4H   levETIRAcetam  750 mg Oral BID   levothyroxine  88 mcg Oral Q0600   liver oil-zinc oxide   Topical BID   midodrine  10 mg Oral TID WC   mirtazapine  7.5 mg Oral BID    nutrition supplement (JUVEN)  1 packet Oral BID BM   nystatin cream   Topical BID   sodium chloride flush  3 mL Intravenous Q12H   zinc sulfate  220 mg Oral Daily     Data Reviewed:   CBG:  Recent Labs  Lab 06/30/22 1542 06/30/22 1958 06/30/22 2330 07/01/22 0355 07/01/22 0738  GLUCAP 81 82 79 70 83    SpO2: 100 % O2 Flow Rate (L/min): 4 L/min    Vitals:   06/30/22 1541 06/30/22 1952 07/01/22 0358 07/01/22 0738  BP: (!) 88/60 (!) 140/73 125/60 132/71  Pulse: 66 65 71 64  Resp: 18 18 18 18   Temp: 97.8 F (36.6 C) 97.7 F (36.5 C) 97.8 F (36.6 C) 98 F (36.7 C)  TempSrc:  Oral Oral   SpO2: 100% 100% 96% 100%  Weight:          Data Reviewed:  Basic Metabolic Panel: Recent Labs  Lab 06/25/22 0919 06/25/22 1427 06/27/22 0631 06/27/22 1456 06/28/22 0908 06/29/22 0445 06/30/22 0439 07/01/22 0507  NA  --    < > 139  --  140 145 145 145  K  --    < > 2.9* 3.3* 3.8 3.3* 3.6 3.0*  CL  --    < > 114*  --  118* 123* 122* 121*  CO2  --    < > 16*  --  16* 17* 19* 19*  GLUCOSE  --    < > 88  --  97 85 85 119*  BUN  --    < > 42*  --  31* 23 16 11   CREATININE  --    < > 2.98*  --  1.83* 1.24* 1.04* 0.85  CALCIUM  --    < > 7.7*  --  7.6* 7.7* 7.5* 7.4*  MG  --    < > 1.6*  --  1.8 1.6* 1.9 1.6*  PHOS 4.1  --   --   --   --   --   --   --    < > = values in this interval not displayed.    CBC: Recent Labs  Lab 06/24/22 1318 06/24/22 2204 06/27/22 0631 06/28/22 0908 06/29/22 0445 06/30/22 0439 07/01/22 0507  WBC 14.5*   < > 10.3 9.1 8.3 6.9 7.5  NEUTROABS 12.5*  --   --   --   --   --   --   HGB 9.3*   < > 8.0* 7.1* 7.1* 8.7* 9.2*  HCT 30.4*   < > 24.7* 22.5* 22.3* 26.7* 28.4*  MCV 80.9   < > 82.9 84.3 83.5 84.8 84.5  PLT PLATELET CLUMPS NOTED ON SMEAR, UNABLE TO ESTIMATE   < > 269 202 206 174 153   < > = values in this interval not displayed.    LFT Recent Labs  Lab 06/24/22 1318  AST 13*  ALT <5  ALKPHOS 169*  BILITOT 0.5  PROT 9.0*   ALBUMIN 3.2*     Antibiotics: Anti-infectives (From admission, onward)    Start     Dose/Rate Route Frequency Ordered Stop   06/25/22 1400  cefTRIAXone (ROCEPHIN) 1 g in  sodium chloride 0.9 % 100 mL IVPB        1 g 200 mL/hr over 30 Minutes Intravenous Every 24 hours 06/25/22 1229 06/29/22 1347   06/24/22 1315  ceFEPIme (MAXIPIME) 2 g in sodium chloride 0.9 % 100 mL IVPB        2 g 200 mL/hr over 30 Minutes Intravenous  Once 06/24/22 1313 06/24/22 1633   06/24/22 1315  metroNIDAZOLE (FLAGYL) IVPB 500 mg        500 mg 100 mL/hr over 60 Minutes Intravenous  Once 06/24/22 1313 06/24/22 1716   06/24/22 1315  vancomycin (VANCOCIN) IVPB 1000 mg/200 mL premix        1,000 mg 200 mL/hr over 60 Minutes Intravenous  Once 06/24/22 1313 06/24/22 1833        DVT prophylaxis: Heparin  Code Status: Full code  Family Communication: Discussed with patient's husband at bedside   CONSULTS Hematology   Subjective    Patient is much more alert, still having diarrhea with 3-4 loose bowel movements.  Objective    Physical Examination:  General-appears in no acute distress Heart-S1-S2, regular, no murmur auscultated Lungs-clear to auscultation bilaterally, no wheezing or crackles auscultated Abdomen-soft, nontender, no organomegaly Extremities-no edema in the lower extremities Neuro-alert, oriented x3, no focal deficit noted  Status is: Inpatient:      Pressure Injury 06/25/22 Buttocks Right;Left;Medial Stage 2 -  Partial thickness loss of dermis presenting as a shallow open injury with a red, pink wound bed without slough. (Active)  06/25/22 0423  Location: Buttocks  Location Orientation: Right;Left;Medial  Staging: Stage 2 -  Partial thickness loss of dermis presenting as a shallow open injury with a red, pink wound bed without slough.  Wound Description (Comments):   Present on Admission: Yes        Renna Kilmer S Alisse Tuite   [facility] If 7PM-7AM, please contact  night-coverage at [email or web address], Office  [phone]   07/01/2022, 8:35 AM  LOS: 7 days

## 2022-07-02 DIAGNOSIS — E876 Hypokalemia: Secondary | ICD-10-CM | POA: Diagnosis not present

## 2022-07-02 DIAGNOSIS — N179 Acute kidney failure, unspecified: Secondary | ICD-10-CM | POA: Diagnosis not present

## 2022-07-02 DIAGNOSIS — E872 Acidosis, unspecified: Secondary | ICD-10-CM | POA: Diagnosis not present

## 2022-07-02 DIAGNOSIS — E861 Hypovolemia: Secondary | ICD-10-CM | POA: Diagnosis not present

## 2022-07-02 LAB — CBC
HCT: 26 % — ABNORMAL LOW (ref 36.0–46.0)
Hemoglobin: 8.5 g/dL — ABNORMAL LOW (ref 12.0–15.0)
MCH: 27.9 pg (ref 26.0–34.0)
MCHC: 32.7 g/dL (ref 30.0–36.0)
MCV: 85.2 fL (ref 80.0–100.0)
Platelets: 124 10*3/uL — ABNORMAL LOW (ref 150–400)
RBC: 3.05 MIL/uL — ABNORMAL LOW (ref 3.87–5.11)
RDW: 20.6 % — ABNORMAL HIGH (ref 11.5–15.5)
WBC: 8.7 10*3/uL (ref 4.0–10.5)
nRBC: 0 % (ref 0.0–0.2)

## 2022-07-02 LAB — BRAIN NATRIURETIC PEPTIDE: B Natriuretic Peptide: 1517.9 pg/mL — ABNORMAL HIGH (ref 0.0–100.0)

## 2022-07-02 LAB — GLUCOSE, CAPILLARY
Glucose-Capillary: 68 mg/dL — ABNORMAL LOW (ref 70–99)
Glucose-Capillary: 80 mg/dL (ref 70–99)
Glucose-Capillary: 81 mg/dL (ref 70–99)
Glucose-Capillary: 82 mg/dL (ref 70–99)
Glucose-Capillary: 86 mg/dL (ref 70–99)
Glucose-Capillary: 86 mg/dL (ref 70–99)
Glucose-Capillary: 88 mg/dL (ref 70–99)
Glucose-Capillary: 94 mg/dL (ref 70–99)

## 2022-07-02 LAB — TROPONIN I (HIGH SENSITIVITY): Troponin I (High Sensitivity): 20 ng/L — ABNORMAL HIGH (ref <18)

## 2022-07-02 LAB — BASIC METABOLIC PANEL
Anion gap: 4 — ABNORMAL LOW (ref 5–15)
BUN: 10 mg/dL (ref 8–23)
CO2: 19 mmol/L — ABNORMAL LOW (ref 22–32)
Calcium: 7.6 mg/dL — ABNORMAL LOW (ref 8.9–10.3)
Chloride: 121 mmol/L — ABNORMAL HIGH (ref 98–111)
Creatinine, Ser: 0.84 mg/dL (ref 0.44–1.00)
GFR, Estimated: >60 mL/min (ref >60)
Glucose, Bld: 82 mg/dL (ref 70–99)
Potassium: 4.5 mmol/L (ref 3.5–5.1)
Sodium: 144 mmol/L (ref 135–145)

## 2022-07-02 LAB — MAGNESIUM: Magnesium: 2.2 mg/dL (ref 1.7–2.4)

## 2022-07-02 MED ORDER — METOPROLOL TARTRATE 25 MG PO TABS
12.5000 mg | ORAL_TABLET | Freq: Once | ORAL | Status: AC
  Administered 2022-07-02: 12.5 mg via ORAL
  Filled 2022-07-02: qty 1

## 2022-07-02 MED ORDER — METOPROLOL TARTRATE 25 MG PO TABS
12.5000 mg | ORAL_TABLET | Freq: Two times a day (BID) | ORAL | Status: DC
  Administered 2022-07-02 – 2022-07-05 (×6): 12.5 mg via ORAL
  Filled 2022-07-02 (×8): qty 1

## 2022-07-02 MED ORDER — METOPROLOL TARTRATE 25 MG PO TABS: 12.5000 mg | ORAL_TABLET | Freq: Two times a day (BID) | ORAL | Status: DC

## 2022-07-02 NOTE — Consult Note (Signed)
   [facility] Inpatient Consult   07/02/2022  Stacy Grimes 1950/04/29 [id number]  Primary Care Provider:  Dr. Terance Hart  [facility])  Patient is currently active with [facility] [[facility]] Care Management for chronic disease management services.  Patient has been engaged by a [facility].  Our community based plan of care has focused on disease management and community resource support.   Patient will receive a post hospital call and will be evaluated for assessments and disease process education.   Plan: pending disposition  Inpatient Transition Of Care [TOC] team member to make aware that [facility] Care Management following.  Of note, [facility] Care Management services does not replace or interfere with any services that are needed or arranged by inpatient [name] care management team.   For additional questions or referrals please contact:  Elliot Cousin, RN, BSN [facility] Lia[facility] [place]   [facility]  Population Health Office Hours MTWF  8:00 am-6:00 pm Off on Thursday [phone] mobile [phone] [Office toll free line][facility] Office Hours are M-F 8:30 - 5 pm 24 hour nurse advise line [phone] Concierge  [email or web address]

## 2022-07-02 NOTE — Progress Notes (Signed)
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  Upon admission she was found to be hypotensive requiring IV fluids and midodrine.  Also found to be significantly dehydrated with hypokalemia and acute kidney injury with metabolic acidosis.  CT scan was unremarkable.  Renal ultrasound was unremarkable.    Assessment/Plan:    Acute kidney injury -Resolved -Metabolic acidosis; in setting of severe dehydration from chronic diarrhea -Baseline creatinine 1.0 ;as of 06/05/2022 -Presented with creatinine of 7.19, improved to 1.04 -Started on D5 half-normal saline along with sodium bicarb infusion -IV fluids were changed to  D5 LR at 75  mL/h -CT abdomen/pelvis unremarkable, renal ultrasound negative -Foley catheter placed, continue to monitor urine output   Diarrhea; chronic -Patient has been says that patient has current diarrhea, CT abdomen pelvis was unremarkable -CT abdomen/pelvis was unremarkable -GI pathogen panel and C. difficile is negative -Started on  Questran 2 g p.o. 3 times daily  -improving, will continue to monitor  UTI -Started on IV Rocephin; treat for total 5 days -Stop date 5/26  Abnormal kappa lambda light chain ratio -Hematology consulted, will follow as outpatient for possible bone biopsy -Multiple myeloma panel is pending   Anemia -Unclear etiology -Hemoglobin is 7.1 -Check stool for occult blood; currently pending -S/p 2 unit PRBC; Hb is up to 8.5  today  Hypokalemia -Replete   Orthostatic hypotension -Resolved -BP was soft, she was started on midodrine 10 mg p.o. 3 times daily -Dose of midodrine have been changed to 5 mg p.o. 3 times daily -Wean off midodrine as blood pressure allows -Cortisol level normal at 26.7   Chronic congestive heart failure (HCC) 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 NSVT (nonsustained ventricular tachycardia) (HCC)  -Patient had 2-minute run of V. tach last night -Will restart metoprolol 12.5 mg p.o. twice daily -BNP is elevated at 1517, patient is on 4 L/min of oxygen which is her baseline    Chronic obstructive pulmonary disease, unspecified COPD type (HCC) Chronic respiratory failure with hypoxia (HCC) -Bronchodilators.  On scheduled Breo    Type 2 diabetes mellitus with hypoglycemia, unspecified whether long term insulin use (HCC) -A1c is 5.9.   -Currently low blood glucose.   -Closely monitor    Dementia with behavioral disturbance (HCC) -Continue donepezil and Namenda    Seizures (HCC) - Continue home keppra    Hypothyroidism -Low TSH, slightly high ft4.  -Reduce Synthroid .  -Repeat levels in 4 weeks outpatient  Poor p.o. intake/malnutrition -As per patient husband; she has had poor p.o. intake for a while -Dietitian consulted, started on supplementation -Also received 2 doses of Megace 400 mg p.o. over the past 2 days  Hypomagnesemia -Replete     Medications     (feeding supplement) PROSource Plus  30 mL Oral BID BM   sodium chloride   Intravenous Once   ascorbic acid  500 mg Oral BID   atorvastatin  80 mg Oral Daily   Chlorhexidine Gluconate Cloth  6 each Topical Daily   cholestyramine light  2 g Oral TID   clopidogrel  75 mg Oral Daily   feeding supplement  237 mL Oral TID BM   fluticasone furoate-vilanterol  1 puff Inhalation Daily   folic acid  1 mg Oral Daily   heparin injection (subcutaneous)  5,000 Units Subcutaneous Q8H    insulin aspart  0-9 Units Subcutaneous Q4H   levETIRAcetam  750 mg Oral BID   levothyroxine  88 mcg Oral Q0600   liver oil-zinc oxide   Topical BID   metoprolol tartrate  12.5 mg Oral BID   midodrine  5 mg Oral TID WC   mirtazapine  7.5 mg Oral BID   multivitamin with minerals  1 tablet Oral Q lunch   nystatin cream   Topical BID   mouth rinse  15 mL Mouth Rinse 4 times per day   sodium chloride flush  3 mL Intravenous Q12H   zinc sulfate  220 mg Oral Daily     Data Reviewed:   CBG:  Recent Labs  Lab 07/01/22 1129 07/01/22 1615 07/01/22 2008 07/02/22 0008 07/02/22 0422  GLUCAP 84 84 71 94 81    SpO2: 99 % O2 Flow Rate (L/min): 4 L/min    Vitals:   07/01/22 1933 07/01/22 2225 07/01/22 2320 07/02/22 0424  BP: 121/71 117/79  114/64  Pulse: 73 89  69  Resp: 18 16  16   Temp: 98 F (36.7 C) 98 F (36.7 C) (!) 97.5 F (36.4 C) 98.4 F (36.9 C)  TempSrc: Oral Oral  Oral  SpO2: 99% 100%  99%  Weight:          Data Reviewed:  Basic Metabolic Panel: Recent Labs  Lab 06/25/22 0919 06/25/22 1427 06/29/22 0445 06/30/22 0439 07/01/22 0507 07/01/22 2235 07/02/22 0608  NA  --    < > 145 145 145 145 144  K  --    < > 3.3* 3.6 3.0* 3.9 4.5  CL  --    < > 123* 122* 121* 117* 121*  CO2  --    < > 17* 19* 19* 20* 19*  GLUCOSE  --    < > 85 85 119* 95 82  BUN  --    < > 23 16 11 10 10   CREATININE  --    < > 1.24* 1.04* 0.85 0.88 0.84  CALCIUM  --    < > 7.7* 7.5* 7.4* 7.8* 7.6*  MG  --    < > 1.6* 1.9 1.6* 1.7 2.2  PHOS 4.1  --   --   --   --   --   --    < > = values in this interval not displayed.    CBC: Recent Labs  Lab 06/28/22 0908 06/29/22 0445 06/30/22 0439 07/01/22 0507 07/02/22 0608  WBC 9.1 8.3 6.9 7.5 8.7  HGB 7.1* 7.1* 8.7* 9.2* 8.5*  HCT 22.5* 22.3* 26.7* 28.4* 26.0*  MCV 84.3 83.5 84.8 84.5 85.2  PLT 202 206 174 153 124*    LFT No results for input(s): "AST", "ALT", "ALKPHOS", "BILITOT", "PROT", "ALBUMIN" in the last 168 hours.     Antibiotics: Anti-infectives (From admission, onward)    Start     Dose/Rate Route Frequency Ordered Stop   06/25/22 1400  cefTRIAXone (ROCEPHIN) 1 g in sodium chloride 0.9 % 100 mL IVPB        1 g 200 mL/hr over 30 Minutes Intravenous Every 24 hours 06/25/22 1229 06/29/22 1347   06/24/22 1315  ceFEPIme (MAXIPIME) 2 g in sodium chloride 0.9 % 100 mL IVPB  2 g 200 mL/hr over 30 Minutes Intravenous  Once 06/24/22 1313 06/24/22 1633   06/24/22 1315  metroNIDAZOLE (FLAGYL) IVPB 500 mg        500 mg 100 mL/hr over 60 Minutes Intravenous  Once 06/24/22 1313 06/24/22 1716   06/24/22 1315  vancomycin (VANCOCIN) IVPB 1000 mg/200 mL premix        1,000 mg 200 mL/hr over 60 Minutes Intravenous  Once 06/24/22 1313 06/24/22 1833        DVT prophylaxis: Heparin  Code Status: Full code  Family Communication: Discussed with patient's husband at bedside   CONSULTS Hematology   Subjective    Patient had V. tach last night for 2 minutes.  Started back on metoprolol.  Objective    Physical Examination:   S1-S2, regular general-appears in no acute distress Heart-S1-S2, regular, no murmur auscultated Lungs-clear to auscultation bilaterally, no wheezing or crackles auscultated Abdomen-soft, nontender, no organomegaly Extremities-no edema in the lower extremities Neuro-alert, oriented x3, no focal deficit noted   Status is: Inpatient:      Pressure Injury 06/25/22 Buttocks Right;Left;Medial Stage 2 -  Partial thickness loss of dermis presenting as a shallow open injury with a red, pink wound bed without slough. (Active)  06/25/22 0423  Location: Buttocks  Location Orientation: Right;Left;Medial  Staging: Stage 2 -  Partial thickness loss of dermis presenting as a shallow open injury with a red, pink wound bed without slough.  Wound Description (Comments):   Present on Admission: Yes        Kamariyah Timberlake S Kerianne Gurr   [facility] If 7PM-7AM, please contact night-coverage  at [email or web address], Office  [phone]   07/02/2022, 7:51 AM  LOS: 8 days

## 2022-07-03 DIAGNOSIS — N179 Acute kidney failure, unspecified: Secondary | ICD-10-CM | POA: Diagnosis not present

## 2022-07-03 LAB — COMPREHENSIVE METABOLIC PANEL
ALT: 7 U/L (ref 0–44)
AST: 17 U/L (ref 15–41)
Albumin: 2.1 g/dL — ABNORMAL LOW (ref 3.5–5.0)
Alkaline Phosphatase: 137 U/L — ABNORMAL HIGH (ref 38–126)
Anion gap: 7 (ref 5–15)
BUN: 11 mg/dL (ref 8–23)
CO2: 19 mmol/L — ABNORMAL LOW (ref 22–32)
Calcium: 8 mg/dL — ABNORMAL LOW (ref 8.9–10.3)
Chloride: 118 mmol/L — ABNORMAL HIGH (ref 98–111)
Creatinine, Ser: 0.83 mg/dL (ref 0.44–1.00)
GFR, Estimated: >60 mL/min (ref >60)
Glucose, Bld: 87 mg/dL (ref 70–99)
Potassium: 4.2 mmol/L (ref 3.5–5.1)
Sodium: 144 mmol/L (ref 135–145)
Total Bilirubin: 0.4 mg/dL (ref 0.3–1.2)
Total Protein: 5.8 g/dL — ABNORMAL LOW (ref 6.5–8.1)

## 2022-07-03 LAB — GLUCOSE, CAPILLARY
Glucose-Capillary: 74 mg/dL (ref 70–99)
Glucose-Capillary: 78 mg/dL (ref 70–99)
Glucose-Capillary: 75 mg/dL (ref 70–99)
Glucose-Capillary: 63 mg/dL — ABNORMAL LOW (ref 70–99)
Glucose-Capillary: 80 mg/dL (ref 70–99)
Glucose-Capillary: 86 mg/dL (ref 70–99)
Glucose-Capillary: 61 mg/dL — ABNORMAL LOW (ref 70–99)

## 2022-07-03 MED ORDER — LOPERAMIDE HCL 2 MG PO CAPS
2.0000 mg | ORAL_CAPSULE | Freq: Four times a day (QID) | ORAL | Status: DC | PRN
  Administered 2022-07-03: 2 mg via ORAL
  Filled 2022-07-03 (×2): qty 1

## 2022-07-03 MED ORDER — ADULT MULTIVITAMIN W/MINERALS CH: 1.0000 | ORAL_TABLET | Freq: Every day | ORAL | Status: DC

## 2022-07-03 NOTE — Progress Notes (Signed)
[facility]  - [place] [facility]   PATIENT NAME: Stacy Grimes    MR#:  [id number]  DATE OF BIRTH:  [birth year]  SUBJECTIVE:  patient seen earlier. No family at bedside. Spoke with husband on the phone. Has dementia at baseline. Rectal tube place due to Ronnie stool. So far this morning decreasing output.    VITALS:  Blood pressure 129/81, pulse 92, temperature (!) 97.3 F (36.3 C), resp. rate 19, weight 61.2 kg, SpO2 90 %.  PHYSICAL EXAMINATION:   GENERAL:  72 y.o.-year-old patient with no acute distress.  LUNGS: Normal breath sounds bilaterally, no wheezing CARDIOVASCULAR: S1, S2 normal. No murmur   ABDOMEN: Soft, nontender, nondistended. Bowel sounds present. Rectal tube EXTREMITIES: No  edema b/l.    NEUROLOGIC: nonfocal  patient is alert and awake, at baseline has dementia   LABORATORY PANEL:  CBC Recent Labs  Lab 07/02/22 0608  WBC 8.7  HGB 8.5*  HCT 26.0*  PLT 124*    Chemistries  Recent Labs  Lab 07/02/22 0608  NA 144  K 4.5  CL 121*  CO2 19*  GLUCOSE 82  BUN 10  CREATININE 0.84  CALCIUM 7.6*  MG 2.2    Assessment and Plan  72 year old female with history of systolic CHF with EF 50%, COPD, chronic hypoxemic respiratory failure on oxygen, pulmonary artery hypertension, dementia, hypertension, hypothyroidism, orthostatic hypotension on midodrine, NSVT with ICD in place, normocytic anemia, diabetes mellitus type 2, history of syncope has been having ongoing diarrhea for about 18 days and feeling fatigued.  Upon admission she was found to be hypotensive requiring IV fluids and midodrine.  Also found to be significantly dehydrated with hypokalemia and acute kidney injury with metabolic acidosis.  CT scan was unremarkable.  Renal ultrasound was unremarkable.   Acute kidney injury -Resolved -Metabolic acidosis; in setting of severe dehydration from chronic diarrhea -Baseline creatinine 1.0 ;as of 06/05/2022 -Presented with creatinine of  7.19, improved to 1.04 -CT abdomen/pelvis unremarkable, renal ultrasound negative -Foley catheter placed, continue to monitor urine output   Diarrhea; chronic -Patient has been says that patient has current diarrhea -CT abdomen/pelvis was unremarkable -GI pathogen panel and C. difficile is negative -Started on  Questran 2 g p.o. 3 times daily  -improving, will continue to monitor --remove rectal tube if no out put   UTI -Started on IV Rocephin; treat for total 5 days -Stop date 5/26   Abnormal kappa lambda light chain ratio -Hematology consulted, will follow as outpatient for possible bone biopsy--seen by Dr Epimenio Foot like MM -Multiple myeloma panel is pending    Anemia of chronic dz --S/p 2 unit PRBC; Hb is up to 8.5 today   Hypokalemia/Hypomagnesemia -Replete   Orthostatic hypotension -Resolved -Dose of midodrine have been changed to 5 mg p.o. 3 times daily -Wean off midodrine as blood pressure allows -Cortisol level normal at 26.7    Chronic congestive heart failure (HCC) 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 NSVT (nonsustained ventricular tachycardia) (HCC)  -Patient had 2-minute run of V. tach last night -Will restart metoprolol 12.5 mg p.o. twice daily -BNP is elevated at 1517, patient is on 4 L/min of oxygen which is her baseline    Chronic obstructive pulmonary disease, unspecified COPD type (HCC) Chronic respiratory failure with hypoxia (HCC) -Bronchodilators.  On scheduled Breo    Type 2 diabetes mellitus with hypoglycemia, unspecified whether long term insulin use (HCC) -A1c is 5.9.   -Currently low blood glucose due to poor po intake -Closely monitor  Dementia with behavioral disturbance (HCC) -Continue donepezil and Namenda    Seizures (HCC) - Continue home keppra    Hypothyroidism -Low TSH, slightly high ft4.  -Reduce Synthroid .  -Repeat levels in 4 weeks outpatient   Poor p.o. intake/malnutrition -As per  patient husband; she has had poor p.o. intake for a while -Dietitian consulted, started on supplementation -Also received 2 doses of Megace 400 mg p.o. over the past 2 days   per RN patient pulling lines and tele monitor. Electrolyte so far stable. Will discontinue telemonitoring. Patient has intermittently not been participating with PT OT. Husband aware.   Family communication :husband pn the phone Consults : oncology CODE STATUS: full DVT Prophylaxis : heparin  Level of care: Telemetry Medical Status is: Inpatient Remains inpatient appropriate because: will monitor for another day or two for diarrhea and discharged home with home health    TOTAL TIME TAKING35 CARE OF THIS PATIENT: 35 minutes.  >50% time spent on counselling and coordination of care  Note: This dictation was prepared with Dragon dictation along with smaller phrase technology. Any transcriptional errors that result from this process are unintentional.  Enedina Finner M.D    [facility]   CC: Primary care physician; [name], MD

## 2022-07-03 NOTE — Progress Notes (Signed)
PT Cancellation Note  Patient Details Name: Stacy Grimes MRN: [id number] DOB: Aug 27, 1950   Cancelled Treatment:    Reason Eval/Treat Not Completed: Other (comment). Pt frustrated upon PT/OT arrival, refusing to participate at this time.  PT spoke with pt's husband via phone who reported that therapy at home was working on sitting balance and standing, as well as transferring to a WC. Stated that his/pt goals for now including sitting EOB independently. PT to re-attempt on pt as able.   Olga Coaster PT, DPT 11:09 AM,07/03/22

## 2022-07-03 NOTE — Progress Notes (Signed)
OT Cancellation Note  Patient Details Name: Stacy Grimes MRN: [id number] DOB: Apr 11, 1950   Cancelled Treatment:    Reason Eval/Treat Not Completed: Patient declined, no reason specified. OT attempted co-treat with physical therapist this date. Upon arrival to room, pt with hospital staff in room. Per staff, pt refusing labs. Pt repeats "No no no" and "y'all just gonna stand there and aggravate me". Adamantly declines therapy services at this time. No family at bedside. Will hold until a later date/time as available.  Rockney Ghee, M.S., OTR/L 07/03/22, 9:40 AM

## 2022-07-03 NOTE — Progress Notes (Signed)
Physical Therapy Treatment Patient Details Name: Stacy Grimes MRN: [id number] DOB: 08-22-1950 Today's Date: 07/03/2022   History of Present Illness Pt is a 72 year old female with history of stroke, dementia, COPD on chronic 4 L nasal cannula,  orthostatic hypotension, cardiomyopathy, seizure disorder, PE, and severe PAH s/p AICD who presents to the emergency department for chief concerns of AKI, 18 days of diarrhea, decreased PO, low BP, dehydration.    PT Comments    PT completed session with husband and grandson present in the room. Pt was able to complete bed mobility with maxA for sequencing and LE negotiation. Pt exhibited an increase of pain in buttocks with mobility, but was able to tolerate session. Pt demonstrated difficulty in sitting needing UE support to maintain comfortable sitting posture. Pt tolerated shoulder flexion in sitting able to maintain balance briefly without UE support. Pt was left sidelying on L side with all needs in reach, with family still present. Patient would continue to benefit from skilled therapy interventions to address functional status and goals.     Recommendations for follow up therapy are one component of a multi-disciplinary discharge planning process, led by the attending physician.  Recommendations may be updated based on patient status, additional functional criteria and insurance authorization.  Follow Up Recommendations  Can patient physically be transported by private vehicle: No    Assistance Recommended at Discharge Frequent or constant Supervision/Assistance  Patient can return home with the following Two people to help with walking and/or transfers;Two people to help with bathing/dressing/bathroom;Assistance with cooking/housework;Direct supervision/assist for medications management;Help with stairs or ramp for entrance;Assist for transportation   Equipment Recommendations  Other (comment)    Recommendations for Other Services        Precautions / Restrictions Precautions Precautions: Fall Precaution Comments: rectal tube Restrictions Weight Bearing Restrictions: No     Mobility  Bed Mobility Overal bed mobility: Needs Assistance Bed Mobility: Sidelying to Sit, Supine to Sit, Sit to Supine   Sidelying to sit: Max assist Supine to sit: Max assist Sit to supine: Max assist   General bed mobility comments: Increase in pain with bed mobility    Transfers                        Ambulation/Gait                   Stairs             Wheelchair Mobility    Modified Rankin (Stroke Patients Only)       Balance Overall balance assessment: Needs assistance Sitting-balance support: Feet unsupported Sitting balance-Leahy Scale: Poor                                      Cognition Arousal/Alertness: Awake/alert Behavior During Therapy: WFL for tasks assessed/performed Overall Cognitive Status: History of cognitive impairments - at baseline                                 General Comments: pt responds well to direct commands        Exercises Other Exercises Other Exercises: Ankle pumps, to encourage physical activity and promote blood flow (seated at EOB) Other Exercises: Shoulder flexion, 1 rep each side to assess ROM and sitting balance (in sitting at EOB) Other Exercises: LAQ, to encourage physical  activity (in seated at EOB)    General Comments        Pertinent Vitals/Pain Pain Assessment Pain Assessment: Faces Faces Pain Scale: Hurts little more Pain Location: buttocks Pain Descriptors / Indicators: Grimacing, Moaning, Discomfort Pain Intervention(s): Limited activity within patient's tolerance, Monitored during session, Repositioned    Home Living                          Prior Function            PT Goals (current goals can now be found in the care plan section) Progress towards PT goals: Progressing toward goals     Frequency    Min 2X/week      PT Plan Current plan remains appropriate    Co-evaluation              AM-PAC PT "6 Clicks" Mobility   Outcome Measure  Help needed turning from your back to your side while in a flat bed without using bedrails?: A Lot Help needed moving from lying on your back to sitting on the side of a flat bed without using bedrails?: A Lot Help needed moving to and from a bed to a chair (including a wheelchair)?: Total Help needed standing up from a chair using your arms (e.g., wheelchair or bedside chair)?: Total Help needed to walk in hospital room?: Total Help needed climbing 3-5 steps with a railing? : Total 6 Click Score: 8    End of Session   Activity Tolerance: Patient limited by fatigue;Patient limited by pain Patient left: in bed;with call bell/phone within reach;with family/visitor present Nurse Communication: Mobility status PT Visit Diagnosis: Other abnormalities of gait and mobility (R26.89);Muscle weakness (generalized) (M62.81);Difficulty in walking, not elsewhere classified (R26.2)     Time: 4098-1191 PT Time Calculation (min) (ACUTE ONLY): 12 min  Charges:  $Therapeutic Activity: 8-22 mins                     Lala Lund, PT, SPT

## 2022-07-04 ENCOUNTER — Ambulatory Visit: Payer: Medicare HMO

## 2022-07-04 DIAGNOSIS — N179 Acute kidney failure, unspecified: Secondary | ICD-10-CM | POA: Diagnosis not present

## 2022-07-04 LAB — MULTIPLE MYELOMA PANEL, SERUM
Albumin SerPl Elph-Mcnc: 2.5 g/dL — ABNORMAL LOW (ref 2.9–4.4)
Albumin/Glob SerPl: 0.8 (ref 0.7–1.7)
Alpha 1: 0.3 g/dL (ref 0.0–0.4)
Alpha2 Glob SerPl Elph-Mcnc: 0.9 g/dL (ref 0.4–1.0)
B-Globulin SerPl Elph-Mcnc: 1 g/dL (ref 0.7–1.3)
Gamma Glob SerPl Elph-Mcnc: 1.4 g/dL (ref 0.4–1.8)
Globulin, Total: 3.5 g/dL (ref 2.2–3.9)
IgA: 541 mg/dL — ABNORMAL HIGH (ref 64–422)
IgG (Immunoglobin G), Serum: 1054 mg/dL (ref 586–1602)
IgM (Immunoglobulin M), Srm: 527 mg/dL — ABNORMAL HIGH (ref 26–217)
Total Protein ELP: 6 g/dL (ref 6.0–8.5)

## 2022-07-04 LAB — GLUCOSE, CAPILLARY
Glucose-Capillary: 65 mg/dL — ABNORMAL LOW (ref 70–99)
Glucose-Capillary: 70 mg/dL (ref 70–99)
Glucose-Capillary: 79 mg/dL (ref 70–99)
Glucose-Capillary: 79 mg/dL (ref 70–99)
Glucose-Capillary: 88 mg/dL (ref 70–99)
Glucose-Capillary: 96 mg/dL (ref 70–99)

## 2022-07-04 MED ORDER — LOPERAMIDE HCL 2 MG PO CAPS
2.0000 mg | ORAL_CAPSULE | Freq: Four times a day (QID) | ORAL | Status: AC
  Administered 2022-07-04 (×4): 2 mg via ORAL
  Filled 2022-07-04 (×4): qty 1

## 2022-07-04 NOTE — Progress Notes (Signed)
Occupational Therapy Treatment Patient Details Name: Stacy Grimes MRN: [id number] DOB: [birth year] Today's Date: 07/04/2022   History of present illness Pt is a 72 year old female with history of stroke, dementia, COPD on chronic 4 L nasal cannula,  orthostatic hypotension, cardiomyopathy, seizure disorder, PE, and severe PAH s/p AICD who presents to the emergency department for chief concerns of AKI, 18 days of diarrhea, decreased PO, low BP, dehydration.   OT comments  With coaxing, Stacy Grimes agreed to participate in OT session. She was able to come to EOB sitting with Min A, able to move b/l LE towards EOB and able to engage UE support by utilizing bed rail. Pt able to maintain EOB sitting, w/ fair posture, for ~ 5 minutes before stating she was in pain and needed to return to bed. Required Mod A for sit<supine, for elevating b/l LE. Once legs were on mattress, pt able to move them laterally towards middle of bed w/o physical assistance. Instructed pt is rolling in bed, repositioning, to relief pressure on buttocks, w/ pt able to follow clear, short directions. Used pillows to trial various positions in bed for comfort and pressure relief, w/ pt able to provide input into which positions worked best for her. Pt left in room with NT in attendance, bed alarm activated.    Recommendations for follow up therapy are one component of a multi-disciplinary discharge planning process, led by the attending physician.  Recommendations may be updated based on patient status, additional functional criteria and insurance authorization.    Assistance Recommended at Discharge Frequent or constant Supervision/Assistance  Patient can return home with the following  A lot of help with walking and/or transfers;A lot of help with bathing/dressing/bathroom;Assistance with cooking/housework;Direct supervision/assist for medications management;Assist for transportation;Help with stairs or ramp for entrance;Direct  supervision/assist for financial management;Assistance with feeding   Equipment Recommendations  None recommended by OT    Recommendations for Other Services Other (comment) (palliative care consult?)    Precautions / Restrictions Precautions Precautions: Fall Precaution Comments: rectal tube Restrictions Weight Bearing Restrictions: No       Mobility Bed Mobility Overal bed mobility: Needs Assistance Bed Mobility: Supine to Sit, Sit to Supine     Supine to sit: Min assist Sit to supine: Mod assist   General bed mobility comments: Pt requires lots of encouragment to participate but less physical assistance than earlier this week    Transfers                   General transfer comment: NT, pt refuses     Balance Overall balance assessment: Needs assistance Sitting-balance support: Feet supported Sitting balance-Leahy Scale: Fair       Standing balance-Leahy Scale: Zero                             ADL either performed or assessed with clinical judgement   ADL                                              Extremity/Trunk Assessment Upper Extremity Assessment Upper Extremity Assessment: Generalized weakness   Lower Extremity Assessment Lower Extremity Assessment: Generalized weakness        Vision       Perception     Praxis      Cognition Arousal/Alertness: Awake/alert Behavior During  Therapy: WFL for tasks assessed/performed Overall Cognitive Status: History of cognitive impairments - at baseline                                 General Comments: Unable to provide month or year. Knows she is "at a hospital somewhere."        Exercises Other Exercises Other Exercises: Educ re: positioning in bed, importance of movement    Shoulder Instructions       General Comments      Pertinent Vitals/ Pain       Pain Assessment Faces Pain Scale: Hurts little more Pain Location: buttocks Pain  Descriptors / Indicators: Grimacing, Moaning, Discomfort Pain Intervention(s): Repositioned, Limited activity within patient's tolerance, Relaxation  Home Living                                          Prior Functioning/Environment              Frequency  Min 1X/week        Progress Toward Goals  OT Goals(current goals can now be found in the care plan section)  Progress towards OT goals: Not progressing toward goals - comment  Acute Rehab OT Goals OT Goal Formulation: With patient Time For Goal Achievement: 07/10/22 Potential to Achieve Goals: Fair  Plan Discharge plan remains appropriate    Co-evaluation                 AM-PAC OT "6 Clicks" Daily Activity     Outcome Measure   Help from another person eating meals?: A Little Help from another person taking care of personal grooming?: A Lot Help from another person toileting, which includes using toliet, bedpan, or urinal?: A Lot Help from another person bathing (including washing, rinsing, drying)?: A Lot Help from another person to put on and taking off regular upper body clothing?: A Lot Help from another person to put on and taking off regular lower body clothing?: A Lot 6 Click Score: 13    End of Session    OT Visit Diagnosis: Unsteadiness on feet (R26.81);Muscle weakness (generalized) (M62.81)   Activity Tolerance Patient limited by pain;Patient limited by fatigue   Patient Left in bed;with call bell/phone within reach;with bed alarm set;with nursing/sitter in room   Nurse Communication          Time: 1610-9604 OT Time Calculation (min): 14 min  Charges: OT General Charges $OT Visit: 1 Visit OT Treatments $Self Care/Home Management : 8-22 mins Stacy Craver, PhD, MS, OTR/L 07/04/22, 11:53 AM

## 2022-07-04 NOTE — Care Management Important Message (Signed)
Important Message  Patient Details  Name: Stacy Grimes MRN: [id number] Date of Birth: [birth year]   Medicare Important Message Given:  Yes     [name] 07/04/2022, 10:57 AM

## 2022-07-04 NOTE — Progress Notes (Signed)
[facility]  - [place] at [facility]   PATIENT NAME: Stacy Grimes    MR#:  [id number]  DATE OF BIRTH:  [birth year]  SUBJECTIVE:  patient seen earlier. No family at bedside.  Has dementia at baseline.  So far this morning decreasing output. Per RN not much out put, patient has been refusing some of her meds.   VITALS:  Blood pressure 133/62, pulse 63, temperature 97.9 F (36.6 C), resp. rate 18, weight 61.2 kg, SpO2 100 %.  PHYSICAL EXAMINATION:   GENERAL:  72-year-old patient with no acute distress.  LUNGS: Normal breath sounds bilaterally, no wheezing CARDIOVASCULAR: S1, S2 normal. No murmur   ABDOMEN: Soft, nontender, nondistended. Bowel sounds present. Rectal tube EXTREMITIES: No  edema b/l.    NEUROLOGIC: nonfocal  patient is alert and awake, at baseline has dementia   LABORATORY PANEL:  CBC Recent Labs  Lab 07/02/22 0608  WBC 8.7  HGB 8.5*  HCT 26.0*  PLT 124*     Chemistries  Recent Labs  Lab 07/02/22 0608 07/03/22 1230  NA 144 144  K 4.5 4.2  CL 121* 118*  CO2 19* 19*  GLUCOSE 82 87  BUN 10 11  CREATININE 0.84 0.83  CALCIUM 7.6* 8.0*  MG 2.2  --   AST  --  17  ALT  --  7  ALKPHOS  --  137*  BILITOT  --  0.4     Assessment and Plan  72 year old female with history of systolic CHF with EF 50%, COPD, chronic hypoxemic respiratory failure on oxygen, pulmonary artery hypertension, dementia, hypertension, hypothyroidism, orthostatic hypotension on midodrine, NSVT with ICD in place, normocytic anemia, diabetes mellitus type 2, history of syncope has been having ongoing diarrhea for about 18 days and feeling fatigued.  Upon admission she was found to be hypotensive requiring IV fluids and midodrine.  Also found to be significantly dehydrated with hypokalemia and acute kidney injury with metabolic acidosis.  CT scan was unremarkable.  Renal ultrasound was unremarkable.   Acute kidney injury -Resolved -Metabolic acidosis; in setting  of severe dehydration from chronic diarrhea -Baseline creatinine 1.0 ;as of 06/05/2022 -Presented with creatinine of 7.19, improved to 1.04 -CT abdomen/pelvis unremarkable, renal ultrasound negative -Foley catheter placed, continue to monitor urine output   Diarrhea; chronic -Patient has been says that patient has current diarrhea -CT abdomen/pelvis was unremarkable -GI pathogen panel and C. difficile is negative -Started on  Questran 2 g p.o. 3 times daily  -improving, will continue to monitor --remove rectal tube if decreasing  out put   UTI -Started on IV Rocephin; treat for total 5 days -Stop date 5/26   Abnormal kappa lambda light chain ratio -Hematology consulted, will follow as outpatient for possible bone biopsy--seen by Dr Epimenio Foot like MM -Multiple myeloma panel is pending    Anemia of chronic dz --S/p 2 unit PRBC; Hb is up to 8.5 today   Hypokalemia/Hypomagnesemia -Replete   Orthostatic hypotension -Resolved -Dose of midodrine have been changed to 5 mg p.o. 3 times daily -Wean off midodrine as blood pressure allows -Cortisol level normal at 26.7    Chronic congestive heart failure (HCC) 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 NSVT (nonsustained ventricular tachycardia) (HCC)  -Patient had 2-minute run of V. tach last night -Will restart metoprolol 12.5 mg p.o. twice daily -BNP is elevated at 1517, patient is on 4 L/min of oxygen which is her baseline    Chronic obstructive pulmonary disease, unspecified COPD type (HCC) Chronic respiratory failure with  hypoxia (HCC) -Bronchodilators.  On scheduled Breo    Type 2 diabetes mellitus with hypoglycemia, unspecified whether long term insulin use (HCC) -A1c is 5.9.   -Currently low blood glucose due to poor po intake  Dementia with behavioral disturbance (HCC) -Continue donepezil and Namenda    Seizures (HCC) - Continue home keppra    Hypothyroidism -Low TSH, slightly high ft4.   -Reduce Synthroid .  -Repeat levels in 4 weeks outpatient   Poor p.o. intake/malnutrition -As per patient husband; she has had poor p.o. intake for a while -Dietitian consulted, started on supplementation -Also received 2 doses of Megace 400 mg p.o. over the past 2 days   per RN patient pulling lines and tele monitor. Electrolyte so far stable. Will discontinue telemonitoring. Patient has intermittently not been participating with PT OT. Husband aware.   Family communication :left VM for husband Consults : oncology CODE STATUS: full DVT Prophylaxis : heparin  Level of care: Telemetry Medical Status is: Inpatient Remains inpatient appropriate because: will monitor for another day  for diarrhea and discharge to home with home health Husband declined Rehab this time and in the past    TOTAL TIME TAKING35 CARE OF THIS PATIENT: 35 minutes.  >50% time spent on counselling and coordination of care  Note: This dictation was prepared with Dragon dictation along with smaller phrase technology. Any transcriptional errors that result from this process are unintentional.  Enedina Finner M.D    [facility]   CC: Primary care physician; [name], MD

## 2022-07-05 DIAGNOSIS — Z95810 Presence of automatic (implantable) cardiac defibrillator: Secondary | ICD-10-CM

## 2022-07-05 DIAGNOSIS — F03918 Unspecified dementia, unspecified severity, with other behavioral disturbance: Secondary | ICD-10-CM | POA: Diagnosis not present

## 2022-07-05 DIAGNOSIS — J449 Chronic obstructive pulmonary disease, unspecified: Secondary | ICD-10-CM

## 2022-07-05 DIAGNOSIS — E876 Hypokalemia: Secondary | ICD-10-CM | POA: Diagnosis not present

## 2022-07-05 DIAGNOSIS — Z515 Encounter for palliative care: Secondary | ICD-10-CM

## 2022-07-05 DIAGNOSIS — E861 Hypovolemia: Secondary | ICD-10-CM | POA: Diagnosis not present

## 2022-07-05 DIAGNOSIS — N179 Acute kidney failure, unspecified: Secondary | ICD-10-CM | POA: Diagnosis not present

## 2022-07-05 LAB — GLUCOSE, CAPILLARY
Glucose-Capillary: 60 mg/dL — ABNORMAL LOW (ref 70–99)
Glucose-Capillary: 61 mg/dL — ABNORMAL LOW (ref 70–99)
Glucose-Capillary: 63 mg/dL — ABNORMAL LOW (ref 70–99)
Glucose-Capillary: 70 mg/dL (ref 70–99)
Glucose-Capillary: 73 mg/dL (ref 70–99)
Glucose-Capillary: 76 mg/dL (ref 70–99)
Glucose-Capillary: 78 mg/dL (ref 70–99)
Glucose-Capillary: 86 mg/dL (ref 70–99)
Glucose-Capillary: 86 mg/dL (ref 70–99)
Glucose-Capillary: 91 mg/dL (ref 70–99)

## 2022-07-05 MED ORDER — GLUCOSE 4 G PO CHEW
1.0000 | CHEWABLE_TABLET | Freq: Four times a day (QID) | ORAL | Status: DC
  Administered 2022-07-05 – 2022-07-06 (×5): 4 g via ORAL
  Filled 2022-07-05 (×5): qty 1

## 2022-07-05 MED ORDER — LOPERAMIDE HCL 2 MG PO CAPS
2.0000 mg | ORAL_CAPSULE | Freq: Four times a day (QID) | ORAL | Status: DC | PRN
  Administered 2022-07-06: 2 mg via ORAL
  Filled 2022-07-05: qty 1

## 2022-07-05 MED ORDER — ASCORBIC ACID 500 MG PO TABS: 500.0000 mg | ORAL_TABLET | Freq: Two times a day (BID) | ORAL | 0 refills | Status: DC

## 2022-07-05 MED ORDER — ENSURE ENLIVE PO LIQD: 237.0000 mL | Freq: Three times a day (TID) | ORAL | 12 refills | Status: DC

## 2022-07-05 MED ORDER — MIRTAZAPINE 15 MG PO TABS: 15.0000 mg | ORAL_TABLET | Freq: Every day | ORAL | Status: DC

## 2022-07-05 MED ORDER — ADULT MULTIVITAMIN W/MINERALS CH: 1.0000 | ORAL_TABLET | Freq: Every day | ORAL | 0 refills | Status: DC

## 2022-07-05 MED ORDER — LOPERAMIDE HCL 2 MG PO CAPS: 2.0000 mg | ORAL_CAPSULE | Freq: Four times a day (QID) | ORAL | 0 refills | Status: DC | PRN

## 2022-07-05 MED ORDER — GLUCOSE 40 % PO GEL
1.0000 | ORAL | Status: AC | PRN
  Administered 2022-07-05: 31 g via ORAL
  Filled 2022-07-05: qty 1.21

## 2022-07-05 MED ORDER — MEMANTINE HCL 5 MG PO TABS
10.0000 mg | ORAL_TABLET | Freq: Two times a day (BID) | ORAL | Status: DC
  Administered 2022-07-05 – 2022-07-06 (×2): 10 mg via ORAL
  Filled 2022-07-05 (×2): qty 2

## 2022-07-05 MED ORDER — ALLOPURINOL 100 MG PO TABS
50.0000 mg | ORAL_TABLET | Freq: Every day | ORAL | Status: DC
  Administered 2022-07-05 – 2022-07-06 (×2): 50 mg via ORAL
  Filled 2022-07-05 (×2): qty 1

## 2022-07-05 MED ORDER — MIRTAZAPINE 15 MG PO TABS: 15.0000 mg | ORAL_TABLET | Freq: Every day | ORAL | 0 refills | Status: DC

## 2022-07-05 MED ORDER — GLUCOSE 4 G PO CHEW: 1.0000 | CHEWABLE_TABLET | Freq: Four times a day (QID) | ORAL | 12 refills | Status: DC

## 2022-07-05 MED ORDER — LEVOTHYROXINE SODIUM 88 MCG PO TABS: 88.0000 ug | ORAL_TABLET | Freq: Every day | ORAL | 0 refills | Status: DC

## 2022-07-05 MED ORDER — PROSOURCE PLUS PO LIQD: 30.0000 mL | Freq: Two times a day (BID) | ORAL | 0 refills | Status: DC

## 2022-07-05 MED ORDER — DONEPEZIL HCL 5 MG PO TABS
10.0000 mg | ORAL_TABLET | Freq: Every day | ORAL | Status: DC
  Administered 2022-07-05: 10 mg via ORAL
  Filled 2022-07-05: qty 2

## 2022-07-05 MED ORDER — CHOLESTYRAMINE LIGHT 4 G PO PACK: 2.0000 g | PACK | Freq: Three times a day (TID) | ORAL | 1 refills | Status: DC

## 2022-07-05 MED ORDER — GABAPENTIN 300 MG PO CAPS
300.0000 mg | ORAL_CAPSULE | Freq: Two times a day (BID) | ORAL | Status: DC
  Administered 2022-07-05 – 2022-07-06 (×3): 300 mg via ORAL
  Filled 2022-07-05 (×3): qty 1

## 2022-07-05 NOTE — Progress Notes (Signed)
[facility]  - [place] at [facility]   PATIENT NAME: Stacy Grimes    MR#:  [id number]  DATE OF BIRTH:  [birth year]  SUBJECTIVE:  patient seen earlier. No family at bedside.  Has dementia at baseline. Patient has poor appetite. She has to be encouraged to eat. Rectal output decreasing.   VITALS:  Blood pressure (!) 110/57, pulse 86, temperature 98.6 F (37 C), temperature source Oral, resp. rate 20, weight 62 kg, SpO2 100 %.  PHYSICAL EXAMINATION:   GENERAL:  72 y.o.-year-old patient with no acute distress.  LUNGS: Normal breath sounds bilaterally, no wheezing CARDIOVASCULAR: S1, S2 normal. No murmur   ABDOMEN: Soft, nontender, nondistended. Bowel sounds present.  EXTREMITIES: No  edema b/l.    NEUROLOGIC: nonfocal  patient is alert and awake, at baseline has dementia   LABORATORY PANEL:  CBC Recent Labs  Lab 07/02/22 0608  WBC 8.7  HGB 8.5*  HCT 26.0*  PLT 124*     Chemistries  Recent Labs  Lab 07/02/22 0608 07/03/22 1230  NA 144 144  K 4.5 4.2  CL 121* 118*  CO2 19* 19*  GLUCOSE 82 87  BUN 10 11  CREATININE 0.84 0.83  CALCIUM 7.6* 8.0*  MG 2.2  --   AST  --  17  ALT  --  7  ALKPHOS  --  137*  BILITOT  --  0.4     Assessment and Plan  72 year old female with history of systolic CHF with EF 50%, COPD, chronic hypoxemic respiratory failure on oxygen, pulmonary artery hypertension, dementia, hypertension, hypothyroidism, orthostatic hypotension on midodrine, NSVT with ICD in place, normocytic anemia, diabetes mellitus type 2, history of syncope has been having ongoing diarrhea for about 18 days and feeling fatigued.  Upon admission she was found to be hypotensive requiring IV fluids and midodrine.  Also found to be significantly dehydrated with hypokalemia and acute kidney injury with metabolic acidosis.  CT scan was unremarkable.  Renal ultrasound was unremarkable.   Acute kidney injury -Resolved -Metabolic acidosis; in setting of severe  dehydration from chronic diarrhea -Baseline creatinine 1.0 ;as of 06/05/2022 -Presented with creatinine of 7.19, improved to 1.04 -CT abdomen/pelvis unremarkable, renal ultrasound negative -Foley catheter placed, continue to monitor urine output   Diarrhea; chronic -Patient has been says that patient has current diarrhea -CT abdomen/pelvis was unremarkable -GI pathogen panel and C. difficile is negative -Started on  Questran 2 g p.o. 3 times daily  --remove rectal tube today  UTI -Started on IV Rocephin; treat for total 5 days -Stop date 5/26   Abnormal kappa lambda light chain ratio -Hematology consulted, will follow as outpatient for possible bone biopsy--seen by Dr Epimenio Foot like MM -Multiple myeloma panel is pending    Anemia of chronic dz --S/p 2 unit PRBC; Hb is up to 8.5 today   Hypokalemia/Hypomagnesemia -Replete   Orthostatic hypotension -Resolved -Dose of midodrine have been changed to 5 mg p.o. 3 times daily -Wean off midodrine as blood pressure allows -Cortisol level normal at 26.7    Chronic congestive heart failure (HCC) 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 NSVT (nonsustained ventricular tachycardia) (HCC)  -Patient had 2-minute run of V. tach last night -Will restart metoprolol 12.5 mg p.o. twice daily -BNP is elevated at 1517, patient is on 4 L/min of oxygen which is her baseline    Chronic obstructive pulmonary disease, unspecified COPD type (HCC) Chronic respiratory failure with hypoxia (HCC) -Bronchodilators.  On scheduled Breo    Type 2 diabetes mellitus  with hypoglycemia, unspecified whether long term insulin use (HCC) -A1c is 5.9.   -Currently low blood glucose due to poor po intake  Dementia with behavioral disturbance (HCC) -Continue donepezil and Namenda -- changed Remeron to 15 mg at bedtime    Seizures (HCC) - Continue home keppra    Hypothyroidism -Low TSH, slightly high ft4.  -Reduce Synthroid .   -Repeat levels in 4 weeks outpatient   Poor p.o. intake/malnutrition -As per patient husband; she has had poor p.o. intake for a while -Dietitian consulted, started on supplementation -Also received 2 doses of Megace 400 mg p.o. over the past 2 days  Patient seen by palliative care today. Appreciate their input. Palliative care will follow as outpatient   discussed with patient's husband Mr. Edmon Crape on the phone and yesterday at bedside. Husband understands patient overall condition is declining progressively since December 2023. He also understands paragraph patient's dementia is progressively getting worse. Discussed with him regarding palliative care to be on board and continue present treatment. Patient husband also understands if she continues to decline to consider hospice follow patient at home. Patient is not the best candidate for any artificial feeding. Husband understands. Overall long-term poor prognosis. She is at high risk for readmission.  Family communication : Edmon Crape on the phone Consults : oncology CODE STATUS: full DVT Prophylaxis : heparin  Level of care: Telemetry Medical Status is: Inpatient Remains inpatient appropriate because: per husband's request will keep patient for one more day discharge tomorrow.   TOTAL TIME TAKING35 CARE OF THIS PATIENT: 35 minutes.  >50% time spent on counselling and coordination of care  Note: This dictation was prepared with Dragon dictation along with smaller phrase technology. Any transcriptional errors that result from this process are unintentional.  Enedina Finner M.D    [facility]   CC: Primary care physician; [name], MD

## 2022-07-05 NOTE — Progress Notes (Signed)
Hypoglycemic Event  CBG: 63  Treatment: 1 tube glucose gel  Symptoms: None  Follow-up CBG: Time:0115 CBG Result:86  Possible Reasons for Event: poor po intake  Comments/MD notified:Dr. Para March made aware, this is the second CBG below 70 tonight. Ordered received for glucose gel, pt refuses to have IV inserted.    Stacy Grimes N Stacy Grimes

## 2022-07-05 NOTE — Progress Notes (Signed)
[employer], [facility]) Hospital Liaison Note:   (new referral for outpatient palliative services)  Notified by [facility], Bevelyn Ngo, RN, of patient/family request for [facility] Palliative Care services at home after discharge.  Referral submitted today with an expected dc date of 07/06/22.    Please call with any hospice or outpatient palliative care related questions.  Thank you for the opportunity to participate in this patient's care.  Redge Gainer, [facility] Liaison [phone]

## 2022-07-05 NOTE — Consult Note (Signed)
Consultation Note Date: 07/05/2022 at 0855  Patient Name: Stacy Grimes  DOB: 05-25-1950  MRN: [id number]  Age / Sex: 72 y.o., female  PCP: [name], MD Referring Physician: Enedina Finner, MD  Reason for Consultation: Establishing goals of care  HPI/Patient Profile: 72 y.o. female  with past medical history of systolic CHF with EF 50%, COPD, chronic hypoxemic respiratory failure on oxygen, pulmonary artery hypertension, dementia, hypertension, hypothyroidism, orthostatic hypotension on midodrine, NSVT with ICD in place, normocytic anemia, diabetes mellitus type 2, history of syncope admitted on 06/24/2022 with diarrhea X 18 days.   PMT was consulted to discuss goals of care.  Of note, patient is familiar to our service as we have followed her in previous hospitalizations.  Clinical Assessment and Goals of Care: I have reviewed medical records including EPIC notes, labs and imaging, and assessed the patient. She is nonverbal and unable to engage in GOC and medical decision making independently.   After assessing the patient, I spoke with patient's husband Stacy Grimes over the phone to discuss diagnosis prognosis, GOC, EOL wishes, disposition and options.  I introduced Palliative Medicine as specialized medical care for people living with serious illness. It focuses on providing relief from the symptoms and stress of a serious illness. The goal is to improve quality of life for both the patient and the family.  We discussed changes in patient's since her last hospitalization.  As far as functional and nutritional status, Stacy Grimes shares that the patient has not been eating as much as he would like for her to.  He also shares he has built a rail on her bed to keep her from falling out of it.  He is vague but endorses that she has "declined some since before".  We discussed patient's current illness and what it means  in the larger context of patient's on-going co-morbidities.  I highlighted that dementia is a chronic, progressive, and irreversible disease that is often exacerbated by acute illnesses and hospitalizations.  We also reviewed in addition to her dementia that patient has CHF, COPD, and other conditions that contribute to an overall poor prognosis.  Natural disease trajectory discussed. I attempted to elicit values and goals of care important to the patient.  Stacy Grimes says that if she is in familiar surroundings then her anxiety and depression drops. She is "more accepting of communicating better". She is "not a leader, she is a Emergency planning/management officer".   In light of patient's current medical status and recent decline, I attempted to speak about modifying goals of care and focusing on allowing patient to age in place.  Quality versus quantity of life discussed.  Allowing natural disease process to occur while enjoying what ever time patient has left reviewed.  I attempted to speak about patient's CODE STATUS and boundaries of care.    Stacy Grimes avoided discussion surrounding code status, patient's decline in functional, nutritional, and cognitive status, low albumin, and progression of dementia by remaining fixated on patient not returning to a nursing home. However, he shares he wants  to do whatever he can to give her a good QOL before the [facility] decides to take her. He shares the patient has said recently that she is "tired" and "ready to be done". However, Stacy Grimes wants full code and full scope to remain.   I discussed outpatient palliative services in detail as well as hospice and hospice philosophy.  Stacy Grimes is open to working with outpatient palliative with the understanding that they can help him decide when to enact hospice services. ONgoing discussions and further education needed as patient approaches end stage dementia and need for hospice services likely soon.   Questions and concerns were addressed.   Attending Dr. Allena Katz,  Marzella Schlein, and RN Merry Proud made aware of above discussion. Referral placed for TOC to offer choice of outpatient palliative services.   Stacy Grimes has PMT contact information and was encouraged to contact PMT with any future acute palliative needs.   PMT will monitor the patient peripherally and shadow her chart. Please re-engage with PMT if goals change, at patient/family's request, or if patient's health deteriorates during hospitalization.    Primary Decision Maker NEXT OF KIN  Physical Exam Constitutional:      General: She is not in acute distress.    Appearance: She is normal weight. She is not ill-appearing.  HENT:     Nose: Nose normal.     Mouth/Throat:     Mouth: Mucous membranes are moist.  Eyes:     Pupils: Pupils are equal, round, and reactive to light.  Pulmonary:     Effort: Pulmonary effort is normal.  Abdominal:     Palpations: Abdomen is soft.  Skin:    General: Skin is warm and dry.     Coloration: Skin is pale.  Neurological:     Mental Status: She is alert.     Comments: nonverbal     Palliative Assessment/Data: 40%     Thank you for this consult. Palliative medicine will continue to follow and assist holistically.   Time Total: 75 minutes Greater than 50%  of this time was spent counseling and coordinating care related to the above assessment and plan.  Signed by: Georgiann Cocker, DNP, FNP-BC Palliative Medicine    Please contact Palliative Medicine Team phone at [phone] for questions and concerns.  For individual provider: See Loretha Stapler

## 2022-07-05 NOTE — Progress Notes (Signed)
Hypoglycemic Event  CBG: 61 at 0359  Treatment: graham cracker with peanut butter and orange juice  Symptoms: None  Follow-up CBG: Time:0436 CBG Result:86  Possible Reasons for Event: Unknown  Comments/MD notified:    Mariela Rex N Chanze Teagle

## 2022-07-05 NOTE — Progress Notes (Signed)
Physical Therapy Treatment Patient Details Name: Stacy Grimes MRN: [id number] DOB: 1950/08/04 Today's Date: 07/05/2022   History of Present Illness Pt is a 72 year old female with history of stroke, dementia, COPD on chronic 4 L nasal cannula,  orthostatic hypotension, cardiomyopathy, seizure disorder, PE, and severe PAH s/p AICD who presents to the emergency department for chief concerns of AKI, 18 days of diarrhea, decreased PO, low BP, dehydration.    PT Comments    Patient wakes to voice, becomes gradually more frustrated by PT. Able to initiate movements but ultimately maxA to sit EOB and needed significant encouragement to remain sitting even for 2 minutes. Pt able to return to supine with minA. Also able to roll L with minA to allow for linen change. Pt refusing any further mobility attempts, PT to continue to progress towards goals as able.    Recommendations for follow up therapy are one component of a multi-disciplinary discharge planning process, led by the attending physician.  Recommendations may be updated based on patient status, additional functional criteria and insurance authorization.  Follow Up Recommendations  Can patient physically be transported by private vehicle: No    Assistance Recommended at Discharge Frequent or constant Supervision/Assistance  Patient can return home with the following Two people to help with walking and/or transfers;Two people to help with bathing/dressing/bathroom;Assistance with cooking/housework;Direct supervision/assist for medications management;Help with stairs or ramp for entrance;Assist for transportation   Equipment Recommendations  Other (comment) (TBD)    Recommendations for Other Services       Precautions / Restrictions Precautions Precautions: Fall Precaution Comments: rectal tube Restrictions Weight Bearing Restrictions: No     Mobility  Bed Mobility Overal bed mobility: Needs Assistance Bed Mobility: Supine to  Sit, Sit to Supine     Supine to sit: Max assist Sit to supine: Min assist   General bed mobility comments: pt able to initiate but questionable effort    Transfers                   General transfer comment: NT, pt refuses    Ambulation/Gait                   Stairs             Wheelchair Mobility    Modified Rankin (Stroke Patients Only)       Balance Overall balance assessment: Needs assistance Sitting-balance support: Feet supported Sitting balance-Leahy Scale: Poor                                      Cognition Arousal/Alertness: Awake/alert Behavior During Therapy: WFL for tasks assessed/performed Overall Cognitive Status: History of cognitive impairments - at baseline                                          Exercises      General Comments        Pertinent Vitals/Pain Pain Assessment Faces Pain Scale: Hurts whole lot Pain Location: buttocks Pain Descriptors / Indicators: Grimacing, Moaning, Discomfort Pain Intervention(s): Limited activity within patient's tolerance, Monitored during session, Repositioned    Home Living                          Prior Function  PT Goals (current goals can now be found in the care plan section) Progress towards PT goals: Progressing toward goals (limited)    Frequency    Min 2X/week      PT Plan Current plan remains appropriate    Co-evaluation              AM-PAC PT "6 Clicks" Mobility   Outcome Measure  Help needed turning from your back to your side while in a flat bed without using bedrails?: A Lot Help needed moving from lying on your back to sitting on the side of a flat bed without using bedrails?: A Lot Help needed moving to and from a bed to a chair (including a wheelchair)?: Total Help needed standing up from a chair using your arms (e.g., wheelchair or bedside chair)?: Total Help needed to walk in hospital  room?: Total Help needed climbing 3-5 steps with a railing? : Total 6 Click Score: 8    End of Session Equipment Utilized During Treatment: Gait belt Activity Tolerance: Patient limited by pain;Treatment limited secondary to agitation Patient left: in bed;with call bell/phone within reach Nurse Communication: Mobility status PT Visit Diagnosis: Other abnormalities of gait and mobility (R26.89);Muscle weakness (generalized) (M62.81);Difficulty in walking, not elsewhere classified (R26.2)     Time: 1610-9604 PT Time Calculation (min) (ACUTE ONLY): 13 min  Charges:  $Therapeutic Activity: 8-22 mins                    Olga Coaster PT, DPT 11:29 AM,07/05/22

## 2022-07-05 NOTE — TOC Progression Note (Signed)
Transition of Care [facility]) - Progression Note    Patient Details  Name: Stacy Grimes MRN: [id number] Date of Birth: 08-07-1950  [facility] CM/SW Contact  Chapman Fitch, RN Phone Number: 07/05/2022, 3:33 PM  Clinical Narrative:     Notified by MD that plan remains for patient to discharge home with resumption of home health services.  Patient active with [facility] Per MD patient will require EMS transport for discharge.  Anticipated DC tomorrow  Per Palliative husband is in agreement for outpatient palliative services at discharge.  Referral made and accepted by Ree Kida with [employer]    Expected Discharge Plan: Home w Home Health Services Barriers to Discharge: Continued Medical Work up  Expected Discharge Plan and Services       Living arrangements for the past 2 months: Single Family Home                                       Social Determinants of Health (SDOH) Interventions SDOH Screenings   Food Insecurity: No Food Insecurity (06/10/2022)  Housing: Patient Unable To Answer (06/10/2022)  Transportation Needs: No Transportation Needs (06/10/2022)  Utilities: Not At Risk (03/07/2022)  Depression (PHQ2-9): Low Risk  (09/01/2019)  Tobacco Use: Medium Risk (06/26/2022)    Readmission Risk Interventions    06/28/2022    2:46 PM 04/12/2022    1:27 PM 03/08/2022    1:45 PM  Readmission Risk Prevention Plan  Transportation Screening Complete Complete Complete  Medication Review Oceanographer) Complete Complete Complete  PCP or Specialist appointment within 3-5 days of discharge Complete Complete Complete  HRI or Home Care Consult  Complete Complete  SW Recovery Care/Counseling Consult Complete Complete Complete  Palliative Care Screening Not Applicable Not Complete   Skilled Nursing Facility Not Applicable Not Complete Complete

## 2022-07-06 DIAGNOSIS — N179 Acute kidney failure, unspecified: Secondary | ICD-10-CM | POA: Diagnosis not present

## 2022-07-06 LAB — GLUCOSE, CAPILLARY
Glucose-Capillary: 59 mg/dL — ABNORMAL LOW (ref 70–99)
Glucose-Capillary: 74 mg/dL (ref 70–99)
Glucose-Capillary: 69 mg/dL — ABNORMAL LOW (ref 70–99)
Glucose-Capillary: 95 mg/dL (ref 70–99)

## 2022-07-06 NOTE — Progress Notes (Addendum)
Patient is alert to self and place only. Becomes irritable anytime you go in to assess or perform interventions. Patient asked "Stacy Grimes just aren't going to let me sleep are you?". No pain indicators present. Meds are crushed and given in applesauce. She experiences hypoglycemia (low 60s-70s) due to poor appetite. Foam pad  on buttocks was removed due to ineffectiveness caused by chronic diarrhea. C-diff negative although stool smells suspicious of it. She can have imodium 4x/day for diarrhea-will give. Upon rounding noticed that patient turns herself in bed. Foley catheter was removed on 5/29 by day team although there is no order to remove, an order is in to continue and Dr. Allena Katz has a note about foley being in place. Will discuss with night on-call coverage to see if they want foley reinserted.

## 2022-07-06 NOTE — TOC Transition Note (Signed)
Transition of Care [facility]) - CM/SW Discharge Note   Patient Details  Name: Stacy Grimes MRN: [id number] Date of Birth: [birth year]  [facility]) CM/SW Contact:  Garret Reddish, RN Phone Number: 07/06/2022, 1:38 PM   Clinical Narrative:    Chart reviewed.  Patient has orders for discharge today.    I have informed patient's husband [name] that patient would be a discharge for today.  I have informed [name] that Home Health services have been resumed with [facility]. [facility] will provide HH RN, PT, OT and in home aide.  I have also informed [name] that [facility] will Provider Palliative Care services for patient on discharge.  I have informed Adelina Mings with [facility] that patient will be a discharge for today.    [name] has requested EMS transport for home.  I have arranged  [facility] EMS transport for transport patient home today.    I have informed staff nurse of above information.     Final next level of care: Home w Home Health Services Barriers to Discharge: No Barriers Identified   Patient Goals and CMS Choice CMS Medicare.gov Compare Post Acute Care list provided to:: Patient Represenative (must comment) (I have spoken with patient's son) Choice offered to / list presented to : Spouse  Discharge Placement                    Name of family member notified: Angenette Schrand Patient and family notified of of transfer: 07/06/22  Discharge Plan and Services Additional resources added to the After Visit Summary for                            [facility] Arranged: RN, PT, OT, Nurse's Aide HH Agency: [facility] Date [facility] Agency Contacted: 07/06/22 Time HH Agency Contacted: 1230 Representative spoke with at [facility] Agency: Adelina Mings  Social Determinants of Health (SDOH) Interventions SDOH Screenings   Food Insecurity: No Food Insecurity (06/10/2022)  Housing: Patient Unable To Answer (06/10/2022)  Transportation Needs: No Transportation Needs (06/10/2022)   Utilities: Not At Risk (03/07/2022)  Depression (PHQ2-9): Low Risk  (09/01/2019)  Tobacco Use: Medium Risk (06/26/2022)     Readmission Risk Interventions    06/28/2022    2:46 PM 04/12/2022    1:27 PM 03/08/2022    1:45 PM  Readmission Risk Prevention Plan  Transportation Screening Complete Complete Complete  Medication Review Oceanographer) Complete Complete Complete  PCP or Specialist appointment within 3-5 days of discharge Complete Complete Complete  HRI or Home Care Consult  Complete Complete  SW Recovery Care/Counseling Consult Complete Complete Complete  Palliative Care Screening Not Applicable Not Complete   Skilled Nursing Facility Not Applicable Not Complete Complete

## 2022-07-06 NOTE — Discharge Instructions (Signed)
Palliative care to follow as outpatient.

## 2022-07-06 NOTE — Progress Notes (Signed)
MD Enedina Finner notfied that patient refused heparin injection.

## 2022-07-06 NOTE — Discharge Summary (Signed)
Physician Discharge Summary   Patient: Stacy Grimes MRN: [id number] DOB: May 29, 1950  Admit date:     06/24/2022  Discharge date: 07/06/22  Discharge Physician: Enedina Finner   PCP: [name], MD   Recommendations at discharge:   F/u PCP Dr Terance Hart in 1-2 weeks outpatient palliative care to follow.  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   Hypocalcemia   Chronic obstructive pulmonary disease, unspecified COPD type (HCC)   Chronic respiratory failure with hypoxia (HCC)   Type 2 diabetes mellitus with hyperglycemia, unspecified whether long term insulin use (HCC)   Dementia with behavioral disturbance (HCC)   Dehydration   Chronic congestive heart failure (HCC)   Orthostatic hypotension   Seizures (HCC)   Hypothyroidism   Diarrhea   NSVT (nonsustained ventricular tachycardia) (HCC)   Normocytic anemia   72 year old female with history of systolic CHF with EF 50%, COPD, chronic hypoxemic respiratory failure on oxygen, pulmonary artery hypertension, dementia, hypertension, hypothyroidism, orthostatic hypotension on midodrine, NSVT with ICD in place, normocytic anemia, diabetes mellitus type 2, history of syncope has been having ongoing diarrhea for about 18 days and feeling fatigued.  Upon admission she was found to be hypotensive requiring IV fluids and midodrine.  Also found to be significantly dehydrated with hypokalemia and acute kidney injury with metabolic acidosis.  CT scan was unremarkable.  Renal ultrasound was unremarkable.   Acute kidney injury -Resolved -Metabolic acidosis; in setting of severe dehydration from chronic diarrhea -Baseline creatinine 1.0 ;as of 06/05/2022 -Presented with creatinine of 7.19, improved to 1.04 -CT abdomen/pelvis unremarkable,  renal ultrasound negative --foley removed   Diarrhea; chronic Per husband pt has chronic diarrhea -CT abdomen/pelvis was unremarkable -GI pathogen panel and C. difficile is negative -Started on  Questran 2 g p.o. 3 times daily    UTI - IV Rocephin; treated for total 5 days   Abnormal kappa lambda light chain ratio -Hematology consulted, will follow as outpatient for possible bone biopsy--seen by Dr Epimenio Foot like MM -Multiple myeloma panel is pending    Anemia of chronic dz --S/p 2 unit PRBC; Hb is up to 8.5    Hypokalemia/Hypomagnesemia -Repleted   Orthostatic hypotension -Resolved -Dose of midodrine have been changed to 5 mg p.o. 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 tach last night -metoprolol 12.5 mg p.o. twice daily -BNP is elevated at 1517, patient is on 4 L/min of oxygen which is her baseline    Chronic obstructive pulmonary disease, unspecified COPD type (HCC) Chronic respiratory failure with hypoxia (HCC) -Bronchodilators.  On scheduled Breo    Type 2 diabetes mellitus with hypoglycemia, unspecified whether long term insulin use (HCC) -A1c is 5.9.   -Currently low blood glucose due to poor po intake   Dementia with behavioral disturbance (HCC) -Continue donepezil and Namenda -- changed Remeron to 15 mg at bedtime    Seizures (HCC) - Continue home keppra    Hypothyroidism -Low TSH, slightly high ft4.  -Reduce Synthroid .  -Repeat levels in 4 weeks outpatient   Poor p.o. intake/malnutrition -As per patient husband; she has had poor p.o. intake for a while -Dietitian consulted, started on supplementation   Patient seen by palliative care today. Appreciate their  input. Palliative care will follow as outpatient   will discharge patient to home. She is at  high risk for readmission.    Diet recommendation:  Discharge Diet Orders (From admission, onward)     Start     Ordered   07/06/22 0000  Diet - low sodium heart healthy        07/06/22 1250            DISCHARGE MEDICATION: Allergies as of 07/06/2022       Reactions   Codeine Palpitations   Contrast Media [iodinated Contrast Media] Rash        Medication List     STOP taking these medications    bisacodyl 5 MG EC tablet Commonly known as: bisacodyl   polyethylene glycol 17 g packet Commonly known as: MiraLax       TAKE these medications    acetaminophen 650 MG CR tablet Commonly known as: TYLENOL Take 650 mg by mouth at bedtime.   albuterol 0.63 MG/3ML nebulizer solution Commonly known as: ACCUNEB Take 1 ampule by nebulization every 6 (six) hours as needed for wheezing.   albuterol 108 (90 Base) MCG/ACT inhaler Commonly known as: VENTOLIN HFA Inhale 2 puffs into the lungs every 6 (six) hours as needed.   allopurinol 100 MG tablet Commonly known as: ZYLOPRIM Take 0.5 tablets (50 mg total) by mouth daily.   ascorbic acid 500 MG tablet Commonly known as: VITAMIN C Take 1 tablet (500 mg total) by mouth 2 (two) times daily.   atorvastatin 80 MG tablet Commonly known as: Lipitor Take 1 tablet (80 mg total) by mouth daily.   budesonide-formoterol 160-4.5 MCG/ACT inhaler Commonly known as: SYMBICORT Inhale 2 puffs into the lungs 2 (two) times daily.   cholestyramine light 4 g packet Commonly known as: PREVALITE Take 0.5 packets (2 g total) by mouth 3 (three) times daily.   clopidogrel 75 MG tablet Commonly known as: PLAVIX Take 1 tablet (75 mg total) by mouth daily.   donepezil 10 MG tablet Commonly known as: ARICEPT Take 10 mg by mouth at bedtime.   famotidine 20 MG tablet Commonly known as: PEPCID Take 1 tablet (20 mg total) by mouth daily.   feeding supplement Liqd Take 237 mLs by mouth 3 (three) times daily between meals.   (feeding  supplement) PROSource Plus liquid Take 30 mLs by mouth 2 (two) times daily between meals.   folic acid 1 MG tablet Commonly known as: FOLVITE Take 1 tablet (1 mg total) by mouth daily.   gabapentin 100 MG capsule Commonly known as: NEURONTIN Take 3 capsules (300 mg total) by mouth 2 (two) times daily.   glucose 4 GM chewable tablet Chew 1 tablet (4 g total) by mouth 4 (four) times daily.   hydrocortisone 25 MG suppository Commonly known as: ANUSOL-HC Place 1 suppository (25 mg total) rectally 2 (two) times daily as needed for hemorrhoids or anal itching.   hydrOXYzine 25 MG capsule Commonly known as: VISTARIL Take 25 mg by mouth at bedtime as needed.   levETIRAcetam 750 MG tablet Commonly known as: KEPPRA Take 1 tablet (750 mg total) by mouth 2 (two) times daily.   levothyroxine 88 MCG tablet Commonly known as: SYNTHROID Take 1 tablet (88 mcg total) by mouth daily at 6 (six) AM. What changed:  medication strength how much to take when to take this   loperamide 2 MG capsule Commonly known as: IMODIUM Take 1 capsule (2 mg total) by mouth 4 (four) times daily  as needed for diarrhea or loose stools.   memantine 10 MG tablet Commonly known as: NAMENDA Take 10 mg by mouth in the morning and at bedtime.   metoprolol tartrate 25 MG tablet Commonly known as: LOPRESSOR Take 0.5 tablets (12.5 mg total) by mouth 2 (two) times daily. Hold if SBP <110 mmHg and or HR <65   midodrine 5 MG tablet Commonly known as: PROAMATINE Take 1 tablet (5 mg total) by mouth 3 (three) times daily with meals. Hold if systolic BP >110   mirtazapine 15 MG tablet Commonly known as: REMERON Take 1 tablet (15 mg total) by mouth at bedtime. What changed:  medication strength how much to take when to take this   multivitamin with minerals Tabs tablet Take 1 tablet by mouth daily with lunch.   ondansetron 4 MG tablet Commonly known as: Zofran Take 1 tablet (4 mg total) by mouth every 8 (eight)  hours as needed for nausea or vomiting.   polyvinyl alcohol 1.4 % ophthalmic solution Commonly known as: LIQUIFILM TEARS Place 1 drop into both eyes as needed for dry eyes.   tiotropium 18 MCG inhalation capsule Commonly known as: SPIRIVA Place 1 capsule (18 mcg total) into inhaler and inhale daily as needed (shortness of breath).   traZODone 50 MG tablet Commonly known as: DESYREL Take 1 tablet (50 mg total) by mouth at bedtime as needed for sleep.   Vitamin D (Ergocalciferol) 1.25 MG (50000 UNIT) Caps capsule Commonly known as: DRISDOL Take 1 capsule (50,000 Units total) by mouth every 7 (seven) days.               Discharge Care Instructions  (From admission, onward)           Start     Ordered   07/06/22 0000  Discharge wound care:       Comments: Pressure Injury 06/25/22 Buttocks Right;Left;Medial Stage 2 -  Partial thickness loss of dermis presenting as a shallow open injury with a red, pink wound bed without slough.   07/06/22 1250            Follow-up Information     [name], MD Follow up.   Specialty: Family Medicine Contact information: [address] [place] [address] [phone]                Discharge Exam: Filed Weights   06/24/22 1248 07/05/22 0407 07/06/22 0500  Weight: 61.2 kg 62 kg 57.8 kg   GENERAL:  72 y.o.-year-old patient with no acute distress.  LUNGS: Normal breath sounds bilaterally, no wheezing CARDIOVASCULAR: S1, S2 normal. No murmur   ABDOMEN: Soft, nontender, nondistended. Bowel sounds present.  EXTREMITIES: No  edema b/l.    NEUROLOGIC: nonfocal  patient is alert and awake, at baseline has dementia   Pressure Injury 06/25/22 Buttocks Right;Left;Medial Stage 2 -  Partial thickness loss of dermis presenting as a shallow open injury with a red, pink wound bed without slough. (Active)  06/25/22 0423  Location: Buttocks  Location Orientation: Right;Left;Medial  Staging: Stage 2 -  Partial thickness  loss of dermis presenting as a shallow open injury with a red, pink wound bed without slough.  Wound Description (Comments):   Present on Admission: Yes      Condition at discharge: fair  The results of significant diagnostics from this hospitalization (including imaging, microbiology, ancillary and laboratory) are listed below for reference.   Imaging Studies: US RENAL  Result Date: 06/24/2022 CLINICAL DATA:  Acute kidney  injury EXAM: RENAL / URINARY TRACT ULTRASOUND COMPLETE COMPARISON:  CT 06/24/2022 FINDINGS: Right Kidney: Renal measurements: 8.5 x 4.9 x 4.8 cm = volume: 104.3 mL. Echogenicity within normal limits. No mass or hydronephrosis visualized. Left Kidney: Renal measurements: 10 x 5.1 x 4.8 cm = volume: 126.6 mL. Echogenicity within normal limits. No hydronephrosis. Small cyst at the midpole measuring 14 mm, no imaging follow-up is recommended. Bladder: Appears normal for degree of bladder distention. Other: None. IMPRESSION: Essentially negative renal ultrasound. Electronically Signed   By: Jasmine Pang M.D.   On: 06/24/2022 21:44   CT ABDOMEN PELVIS WO CONTRAST  Result Date: 06/24/2022 CLINICAL DATA:  Acute abdominal pain, diarrhea EXAM: CT ABDOMEN AND PELVIS WITHOUT CONTRAST TECHNIQUE: Multidetector CT imaging of the abdomen and pelvis was performed following the standard protocol without IV contrast. RADIATION DOSE REDUCTION: This exam was performed according to the departmental dose-optimization program which includes automated exposure control, adjustment of the mA and/or kV according to patient size and/or use of iterative reconstruction technique. COMPARISON:  CT 04/11/2022 FINDINGS: Lower chest: Transvenous pacing lead to the RV apex. No pleural or pericardial effusion. Hepatobiliary: No focal liver abnormality is seen. Status post cholecystectomy. No biliary dilatation. Pancreas: Unremarkable. No pancreatic ductal dilatation or surrounding inflammatory changes. Spleen:  Normal in size without focal abnormality. Adrenals/Urinary Tract: No adrenal mass. Symmetric renal contours without urolithiasis or hydronephrosis. Urinary bladder nondistended. Stomach/Bowel: Stomach is incompletely distended, unremarkable. Small bowel decompressed. Appendix not discretely identified. The colon is incompletely distended, with a few descending diverticula. Anastomotic staple line in the proximal sigmoid segment. No acute findings. Vascular/Lymphatic: Scattered aortoiliac calcified atheromatous plaque without aneurysm. No abdominal or pelvic adenopathy. Reproductive: Status post hysterectomy. No adnexal masses. Other: Bilateral pelvic phleboliths.  No ascites.  No free air. Musculoskeletal: Stable moderate L1 vertebral compression deformity. Multilevel lumbar spondylitic change as before. Fixation hardware across right femoral IT fracture. No acute findings. IMPRESSION: 1. No acute findings. 2. Descending colon diverticulosis. 3. Stable L1 vertebral compression deformity. 4.  Aortic Atherosclerosis (ICD10-I70.0). Electronically Signed   By: Corlis Leak M.D.   On: 06/24/2022 14:54   DG Chest Port 1 View  Result Date: 06/24/2022 CLINICAL DATA:  Questionable sepsis, evaluate for abnormality. EXAM: PORTABLE CHEST 1 VIEW COMPARISON:  Radiographs 06/05/2022 and 04/11/2022.  CT 09/21/2009. FINDINGS: 1340 hours. Two views submitted. Unchanged left subclavian AICD with leads projecting to the right atrium, right ventricle and coronary sinus. The heart size and mediastinal contours are stable. The lungs are clear. There is no pleural effusion or pneumothorax. No acute osseous findings are evident. Telemetry leads overlie the chest. IMPRESSION: No evidence of acute cardiopulmonary process. Stable appearance of the AICD. Electronically Signed   By: Carey Bullocks M.D.   On: 06/24/2022 13:54    Microbiology: Results for orders placed or performed during the hospital encounter of 06/24/22  Culture, blood  (Routine x 2)     Status: None   Collection Time: 06/24/22  1:18 PM   Specimen: BLOOD  Result Value Ref Range Status   Specimen Description BLOOD BLOOD RIGHT ARM  Final   Special Requests   Final    BOTTLES DRAWN AEROBIC AND ANAEROBIC Blood Culture adequate volume   Culture   Final    NO GROWTH 5 DAYS Performed at [facility], [address], [place], [place] [address]    Report Status 06/29/2022 FINAL  Final  Culture, blood (Routine x 2)     Status: None   Collection Time: 06/24/22  1:28 PM   Specimen: BLOOD RIGHT ARM  Result Value Ref Range Status   Specimen Description BLOOD RIGHT ARM  Final   Special Requests   Final    BOTTLES DRAWN AEROBIC AND ANAEROBIC Blood Culture adequate volume   Culture   Final    NO GROWTH 5 DAYS Performed at [facility], [address] [place], [place] [address]    Report Status 06/29/2022 FINAL  Final  Resp panel by RT-PCR (RSV, Flu A&B, Covid) STOOL     Status: None   Collection Time: 06/24/22  1:28 PM   Specimen: STOOL; Nasal Swab  Result Value Ref Range Status   SARS Coronavirus 2 by RT PCR NEGATIVE NEGATIVE Final    Comment: (NOTE) SARS-CoV-2 target nucleic acids are NOT DETECTED.  The SARS-CoV-2 RNA is generally detectable in upper respiratory specimens during the acute phase of infection. The lowest concentration of SARS-CoV-2 viral copies this assay can detect is 138 copies/mL. A negative result does not preclude SARS-Cov-2 infection and should not be used as the sole basis for treatment or other patient management decisions. A negative result may occur with  improper specimen collection/handling, submission of specimen other than nasopharyngeal swab, presence of viral mutation(s) within the areas targeted by this assay, and inadequate number of viral copies(<138 copies/mL). A negative result must be combined with clinical observations, patient history, and epidemiological information. The expected result is  Negative.  Fact Sheet for Patients:  BloggerCourse.com  Fact Sheet for Healthcare Providers:  SeriousBroker.it  This test is no t yet approved or cleared by the [place] FDA and  has been authorized for detection and/or diagnosis of SARS-CoV-2 by FDA under an Emergency Use Authorization (EUA). This EUA will remain  in effect (meaning this test can be used) for the duration of the COVID-19 declaration under Section 564(b)(1) of the Act, 21 U.S.C.section 360bbb-3(b)(1), unless the authorization is terminated  or revoked sooner.       Influenza A by PCR NEGATIVE NEGATIVE Final   Influenza B by PCR NEGATIVE NEGATIVE Final    Comment: (NOTE) The Xpert Xpress SARS-CoV-2/FLU/RSV plus assay is intended as an aid in the diagnosis of influenza from Nasopharyngeal swab specimens and should not be used as a sole basis for treatment. Nasal washings and aspirates are unacceptable for Xpert Xpress SARS-CoV-2/FLU/RSV testing.  Fact Sheet for Patients: BloggerCourse.com  Fact Sheet for Healthcare Providers: SeriousBroker.it  This test is not yet approved or cleared by the [place] FDA and has been authorized for detection and/or diagnosis of SARS-CoV-2 by FDA under an Emergency Use Authorization (EUA). This EUA will remain in effect (meaning this test can be used) for the duration of the COVID-19 declaration under Section 564(b)(1) of the Act, 21 U.S.C. section 360bbb-3(b)(1), unless the authorization is terminated or revoked.     Resp Syncytial Virus by PCR NEGATIVE NEGATIVE Final    Comment: (NOTE) Fact Sheet for Patients: BloggerCourse.com  Fact Sheet for Healthcare Providers: SeriousBroker.it  This test is not yet approved or cleared by the [place] FDA and has been authorized for detection and/or diagnosis of  SARS-CoV-2 by FDA under an Emergency Use Authorization (EUA). This EUA will remain in effect (meaning this test can be used) for the duration of the COVID-19 declaration under Section 564(b)(1) of the Act, 21 U.S.C. section 360bbb-3(b)(1), unless the authorization is terminated or revoked.  Performed at [facility], [address] [place], [place] [address]   C Difficile Quick Screen w  PCR reflex     Status: None   Collection Time: 06/24/22  6:30 PM   Specimen: STOOL  Result Value Ref Range Status   C Diff antigen NEGATIVE NEGATIVE Final   C Diff toxin NEGATIVE NEGATIVE Final   C Diff interpretation No C. difficile detected.  Final    Comment: Performed at [facility], [address] [place], [place] [address]  Gastrointestinal Panel by PCR , Stool     Status: None   Collection Time: 06/25/22 10:10 AM   Specimen: Stool  Result Value Ref Range Status   Campylobacter species NOT DETECTED NOT DETECTED Final   Plesimonas shigelloides NOT DETECTED NOT DETECTED Final   Salmonella species NOT DETECTED NOT DETECTED Final   Yersinia enterocolitica NOT DETECTED NOT DETECTED Final   Vibrio species NOT DETECTED NOT DETECTED Final   Vibrio cholerae NOT DETECTED NOT DETECTED Final   Enteroaggregative E coli (EAEC) NOT DETECTED NOT DETECTED Final   Enteropathogenic E coli (EPEC) NOT DETECTED NOT DETECTED Final   Enterotoxigenic E coli (ETEC) NOT DETECTED NOT DETECTED Final   Shiga like toxin producing E coli (STEC) NOT DETECTED NOT DETECTED Final   Shigella/Enteroinvasive E coli (EIEC) NOT DETECTED NOT DETECTED Final   Cryptosporidium NOT DETECTED NOT DETECTED Final   Cyclospora cayetanensis NOT DETECTED NOT DETECTED Final   Entamoeba histolytica NOT DETECTED NOT DETECTED Final   Giardia lamblia NOT DETECTED NOT DETECTED Final   Adenovirus F40/41 NOT DETECTED NOT DETECTED Final   Astrovirus NOT DETECTED NOT DETECTED Final   Norovirus GI/GII NOT DETECTED NOT DETECTED  Final   Rotavirus A NOT DETECTED NOT DETECTED Final   Sapovirus (I, II, IV, and V) NOT DETECTED NOT DETECTED Final    Comment: Performed at [facility], [address] [place], [place] [address]    Labs: CBC: Recent Labs  Lab 06/30/22 0439 07/01/22 0507 07/02/22 0608  WBC 6.9 7.5 8.7  HGB 8.7* 9.2* 8.5*  HCT 26.7* 28.4* 26.0*  MCV 84.8 84.5 85.2  PLT 174 153 124*   Basic Metabolic Panel: Recent Labs  Lab 06/30/22 0439 07/01/22 0507 07/01/22 2235 07/02/22 0608 07/03/22 1230  NA 145 145 145 144 144  K 3.6 3.0* 3.9 4.5 4.2  CL 122* 121* 117* 121* 118*  CO2 19* 19* 20* 19* 19*  GLUCOSE 85 119* 95 82 87  BUN 16 11 10 10 11   CREATININE 1.04* 0.85 0.88 0.84 0.83  CALCIUM 7.5* 7.4* 7.8* 7.6* 8.0*  MG 1.9 1.6* 1.7 2.2  --    Liver Function Tests: Recent Labs  Lab 07/03/22 1230  AST 17  ALT 7  ALKPHOS 137*  BILITOT 0.4  PROT 5.8*  ALBUMIN 2.1*   CBG: Recent Labs  Lab 07/05/22 2348 07/06/22 0504 07/06/22 0547 07/06/22 0902 07/06/22 1118  GLUCAP 78 59* 74 69* 95    Discharge time spent: greater than 30 minutes.  Signed: Enedina Finner, MD [facility] 07/06/2022

## 2022-07-06 NOTE — Progress Notes (Addendum)
1539- patient discharged with EMS  Patient is stable in bed at this time. She will discharge with EMS when they arrive. Patient with cognition deficits, AVS left for husband to review at home in packet.  07/06/22 1447  AVS Discharge Documentation  AVS Discharge Instructions Including Medications Provided to patient/caregiver;Placed in discharge packet for receiving facility  Name of Person Receiving AVS Discharge Instructions Including Medications Olegario Messier and EMS workers, in folder for husband to review at home  Name of Clinician That Reviewed AVS Discharge Instructions Including Medications Westley Chandler, RN

## 2022-07-08 ENCOUNTER — Ambulatory Visit: Payer: Medicare HMO | Attending: Internal Medicine

## 2022-07-08 DIAGNOSIS — Z95810 Presence of automatic (implantable) cardiac defibrillator: Secondary | ICD-10-CM

## 2022-07-08 DIAGNOSIS — I5022 Chronic systolic (congestive) heart failure: Secondary | ICD-10-CM | POA: Diagnosis not present

## 2022-07-08 NOTE — Progress Notes (Signed)
EPIC Encounter for ICM Monitoring  Patient Name: Stacy Grimes is a 72 y.o. female Date: 07/08/2022 Primary Care Physican: [name], MD Primary Cardiologist: Kirke Corin Electrophysiologist: Joycelyn Schmid Pacing:  98%  10/12/2020 Office Weight: 184 lbs 03/05/2022 Weight:  Unable to weigh at home   AT/AF Burden <1% (taking Aspirin & Plavix)   Spoke with husband and heart failure questions reviewed.  Transmission results reviewed.  Husband reports her health is declining very rapidly and he is there providing as much comfort as he can.  She is better since hospital discharge.     Coruve thoracic impedance suggesting possible dryness from 5/11-5/21 followed by possible fluid accumulation from 5/22-6/2 (hospitalization 5/20-6/1).    Prescribed:  No diuretics   Labs: 07/03/2022 Creatinine 0..83, BUN 11, Potassium 4.2, Sodium 144, GFR >60  07/02/2022 Creatinine 0.84, BUN 10, Potassium 4.5, Sodium 144, GFR >60  07/01/2022 Creatinine 0.85, BUN 11, Potassium 3.0, Sodium 145, GFR >60  06/30/2022 Creatinine 1.04, BUN 16, Potassium 3.6, Sodium 145, GFR 57 06/29/2022 Creatinine 1.24, BUN 23, Potassium 3.3, Sodium 145, GFR 46  06/28/2022 Creatinine 1.83, BUN 31, Potassium 3.8, Sodium 140, GFR 29  06/27/2022 Creatinine 2.98, BUN 42, Potassium 2.9, Sodium 139, GFR 16  A complete set of results can be found in Results Review.   Recommendations:  No changes and encouraged to call if patient experiences any fluid symptoms or changes in condition.   Follow-up plan: ICM clinic phone appointment on 07/22/2022 to recheck fluid levels.  91 day device clinic remote transmission scheduled 10/03/2022.     EP/Cardiology Office Visits:   Canceled 03/08/2022 with Dr Kirke Corin.     Last OV with Dr Graciela Husbands was 07/16/2017.    Copy of ICM check sent to Dr. Graciela Husbands.       3 month ICM trend: 07/08/2022.    12-14 Month ICM trend:     Karie Soda, RN 07/08/2022 4:21 PM

## 2022-07-09 ENCOUNTER — Telehealth: Payer: Self-pay | Admitting: *Deleted

## 2022-07-09 LAB — MULTIPLE MYELOMA PANEL, SERUM
Albumin SerPl Elph-Mcnc: 2 g/dL — ABNORMAL LOW (ref 2.9–4.4)
Albumin/Glob SerPl: 0.8 (ref 0.7–1.7)
Alpha 1: 0.2 g/dL (ref 0.0–0.4)
Alpha2 Glob SerPl Elph-Mcnc: 0.7 g/dL (ref 0.4–1.0)
B-Globulin SerPl Elph-Mcnc: 0.9 g/dL (ref 0.7–1.3)
Gamma Glob SerPl Elph-Mcnc: 1 g/dL (ref 0.4–1.8)
Globulin, Total: 2.8 g/dL (ref 2.2–3.9)
IgA: 450 mg/dL — ABNORMAL HIGH (ref 64–422)
IgG (Immunoglobin G), Serum: 940 mg/dL (ref 586–1602)
IgM (Immunoglobulin M), Srm: 461 mg/dL — ABNORMAL HIGH (ref 26–217)
Total Protein ELP: 4.8 g/dL — ABNORMAL LOW (ref 6.0–8.5)

## 2022-07-09 NOTE — Patient Outreach (Signed)
  Care Coordination   Follow Up Visit Note   07/09/2022 Name: CAMELIA SUDHOFF MRN: [id number] DOB: Oct 21, 1950  Lewanda Rife is a 72 y.o. year old female who sees [name], MD for primary care. I spoke with [name], husband of DESTENEY HOGLE by phone today.  What matters to the patients health and wellness today?  Per husband, get strong enough to sit up on her own and able to transfer to chair/BSC with minimal assistance.   Goals Addressed             This Visit's Progress    Recover from hospital admission       Interventions Today    Flowsheet Row Most Recent Value  Chronic Disease   Chronic disease during today's visit Chronic Kidney Disease/End Stage Renal Disease (ESRD), Other  [hypokalemia]  General Interventions   General Interventions Discussed/Reviewed General Interventions Reviewed, [facility], Communication with, Doctor Visits  [Was in the ED on 5/1 for weakness and dehydration, admitted 5/20-6/1 for AKI and hypokalemia]  Doctor Visits Discussed/Reviewed Doctor Visits Reviewed, PCP  [facility] PCP visit on 624, unsure if he will be able to get her out of the home, currently bed bound. Discussed having a home provider, agrees to referral.  Also has [facility] and [facility].  HH will start tomorrow, still waiting for palliative care to call]  Communication with PCP/Specialists  [Referral to [facility]]  Education Interventions   Education Provided Provided Education  Provided Verbal Education On [facility], When to see the doctor  Gayla Doss concerned if [facility] will accept insurance, advised they will call to discuss program and details prior to initial home visit]              SDOH assessments and interventions completed:  No     Care Coordination Interventions:  Yes, provided    Follow up plan: Referral made to [facility] Follow up call scheduled for 6/17    Encounter Outcome:  Pt. Visit Completed   Kemper Durie, RN, MSN,  [facility] Care Management Care Management Coordinator [phone]

## 2022-07-16 ENCOUNTER — Emergency Department: Payer: Medicare HMO

## 2022-07-16 ENCOUNTER — Inpatient Hospital Stay
Admission: EM | Admit: 2022-07-16 | Discharge: 2022-07-23 | DRG: 391 | Disposition: A | Discharge status: Hospice | Payer: Medicare HMO | Attending: Internal Medicine | Admitting: Internal Medicine

## 2022-07-16 DIAGNOSIS — Z7401 Bed confinement status: Secondary | ICD-10-CM

## 2022-07-16 DIAGNOSIS — N179 Acute kidney failure, unspecified: Secondary | ICD-10-CM | POA: Diagnosis not present

## 2022-07-16 DIAGNOSIS — F03918 Unspecified dementia, unspecified severity, with other behavioral disturbance: Secondary | ICD-10-CM | POA: Diagnosis present

## 2022-07-16 DIAGNOSIS — Z90710 Acquired absence of both cervix and uterus: Secondary | ICD-10-CM

## 2022-07-16 DIAGNOSIS — E039 Hypothyroidism, unspecified: Secondary | ICD-10-CM | POA: Diagnosis present

## 2022-07-16 DIAGNOSIS — F419 Anxiety disorder, unspecified: Secondary | ICD-10-CM | POA: Diagnosis present

## 2022-07-16 DIAGNOSIS — I428 Other cardiomyopathies: Secondary | ICD-10-CM | POA: Diagnosis present

## 2022-07-16 DIAGNOSIS — J9611 Chronic respiratory failure with hypoxia: Secondary | ICD-10-CM | POA: Diagnosis present

## 2022-07-16 DIAGNOSIS — G4733 Obstructive sleep apnea (adult) (pediatric): Secondary | ICD-10-CM | POA: Diagnosis present

## 2022-07-16 DIAGNOSIS — E872 Acidosis, unspecified: Secondary | ICD-10-CM | POA: Diagnosis not present

## 2022-07-16 DIAGNOSIS — F0393 Unspecified dementia, unspecified severity, with mood disturbance: Secondary | ICD-10-CM | POA: Diagnosis present

## 2022-07-16 DIAGNOSIS — E860 Dehydration: Secondary | ICD-10-CM | POA: Diagnosis present

## 2022-07-16 DIAGNOSIS — F322 Major depressive disorder, single episode, severe without psychotic features: Secondary | ICD-10-CM | POA: Diagnosis present

## 2022-07-16 DIAGNOSIS — D638 Anemia in other chronic diseases classified elsewhere: Secondary | ICD-10-CM | POA: Diagnosis present

## 2022-07-16 DIAGNOSIS — Z9981 Dependence on supplemental oxygen: Secondary | ICD-10-CM

## 2022-07-16 DIAGNOSIS — G9341 Metabolic encephalopathy: Secondary | ICD-10-CM | POA: Diagnosis not present

## 2022-07-16 DIAGNOSIS — K909 Intestinal malabsorption, unspecified: Secondary | ICD-10-CM | POA: Diagnosis present

## 2022-07-16 DIAGNOSIS — Z91041 Radiographic dye allergy status: Secondary | ICD-10-CM

## 2022-07-16 DIAGNOSIS — R112 Nausea with vomiting, unspecified: Secondary | ICD-10-CM | POA: Diagnosis not present

## 2022-07-16 DIAGNOSIS — Z743 Need for continuous supervision: Secondary | ICD-10-CM | POA: Diagnosis not present

## 2022-07-16 DIAGNOSIS — R627 Adult failure to thrive: Secondary | ICD-10-CM | POA: Diagnosis not present

## 2022-07-16 DIAGNOSIS — I714 Abdominal aortic aneurysm, without rupture, unspecified: Secondary | ICD-10-CM | POA: Diagnosis present

## 2022-07-16 DIAGNOSIS — R0689 Other abnormalities of breathing: Secondary | ICD-10-CM | POA: Diagnosis not present

## 2022-07-16 DIAGNOSIS — R109 Unspecified abdominal pain: Secondary | ICD-10-CM | POA: Diagnosis not present

## 2022-07-16 DIAGNOSIS — Z885 Allergy status to narcotic agent status: Secondary | ICD-10-CM

## 2022-07-16 DIAGNOSIS — R531 Weakness: Secondary | ICD-10-CM | POA: Diagnosis not present

## 2022-07-16 DIAGNOSIS — R630 Anorexia: Secondary | ICD-10-CM | POA: Diagnosis not present

## 2022-07-16 DIAGNOSIS — I110 Hypertensive heart disease with heart failure: Secondary | ICD-10-CM | POA: Diagnosis present

## 2022-07-16 DIAGNOSIS — F32A Depression, unspecified: Secondary | ICD-10-CM | POA: Diagnosis present

## 2022-07-16 DIAGNOSIS — Z515 Encounter for palliative care: Secondary | ICD-10-CM | POA: Diagnosis not present

## 2022-07-16 DIAGNOSIS — J449 Chronic obstructive pulmonary disease, unspecified: Secondary | ICD-10-CM | POA: Diagnosis present

## 2022-07-16 DIAGNOSIS — Z803 Family history of malignant neoplasm of breast: Secondary | ICD-10-CM

## 2022-07-16 DIAGNOSIS — R64 Cachexia: Secondary | ICD-10-CM | POA: Diagnosis present

## 2022-07-16 DIAGNOSIS — Z7951 Long term (current) use of inhaled steroids: Secondary | ICD-10-CM

## 2022-07-16 DIAGNOSIS — Z1152 Encounter for screening for COVID-19: Secondary | ICD-10-CM | POA: Diagnosis not present

## 2022-07-16 DIAGNOSIS — Z9049 Acquired absence of other specified parts of digestive tract: Secondary | ICD-10-CM

## 2022-07-16 DIAGNOSIS — Z87891 Personal history of nicotine dependence: Secondary | ICD-10-CM

## 2022-07-16 DIAGNOSIS — Z6822 Body mass index (BMI) 22.0-22.9, adult: Secondary | ICD-10-CM

## 2022-07-16 DIAGNOSIS — Z8673 Personal history of transient ischemic attack (TIA), and cerebral infarction without residual deficits: Secondary | ICD-10-CM

## 2022-07-16 DIAGNOSIS — R5381 Other malaise: Secondary | ICD-10-CM | POA: Diagnosis present

## 2022-07-16 DIAGNOSIS — R0902 Hypoxemia: Secondary | ICD-10-CM | POA: Diagnosis not present

## 2022-07-16 DIAGNOSIS — N390 Urinary tract infection, site not specified: Secondary | ICD-10-CM | POA: Diagnosis present

## 2022-07-16 DIAGNOSIS — Z800 Family history of malignant neoplasm of digestive organs: Secondary | ICD-10-CM

## 2022-07-16 DIAGNOSIS — Z79890 Hormone replacement therapy (postmenopausal): Secondary | ICD-10-CM

## 2022-07-16 DIAGNOSIS — Z95810 Presence of automatic (implantable) cardiac defibrillator: Secondary | ICD-10-CM

## 2022-07-16 DIAGNOSIS — Z0389 Encounter for observation for other suspected diseases and conditions ruled out: Secondary | ICD-10-CM | POA: Diagnosis not present

## 2022-07-16 DIAGNOSIS — E43 Unspecified severe protein-calorie malnutrition: Secondary | ICD-10-CM | POA: Diagnosis not present

## 2022-07-16 DIAGNOSIS — E876 Hypokalemia: Secondary | ICD-10-CM | POA: Diagnosis not present

## 2022-07-16 DIAGNOSIS — I5022 Chronic systolic (congestive) heart failure: Secondary | ICD-10-CM | POA: Diagnosis not present

## 2022-07-16 DIAGNOSIS — H9192 Unspecified hearing loss, left ear: Secondary | ICD-10-CM | POA: Diagnosis present

## 2022-07-16 DIAGNOSIS — G40909 Epilepsy, unspecified, not intractable, without status epilepticus: Secondary | ICD-10-CM | POA: Diagnosis present

## 2022-07-16 DIAGNOSIS — Z79899 Other long term (current) drug therapy: Secondary | ICD-10-CM

## 2022-07-16 DIAGNOSIS — E11649 Type 2 diabetes mellitus with hypoglycemia without coma: Secondary | ICD-10-CM | POA: Diagnosis not present

## 2022-07-16 DIAGNOSIS — Z8249 Family history of ischemic heart disease and other diseases of the circulatory system: Secondary | ICD-10-CM

## 2022-07-16 DIAGNOSIS — K219 Gastro-esophageal reflux disease without esophagitis: Secondary | ICD-10-CM | POA: Diagnosis present

## 2022-07-16 DIAGNOSIS — Z823 Family history of stroke: Secondary | ICD-10-CM

## 2022-07-16 LAB — CBC
HCT: 40.1 % (ref 36.0–46.0)
HCT: 30.5 % — ABNORMAL LOW (ref 36.0–46.0)
Hemoglobin: 12.3 g/dL (ref 12.0–15.0)
Hemoglobin: 9.5 g/dL — ABNORMAL LOW (ref 12.0–15.0)
MCH: 27 pg (ref 26.0–34.0)
MCH: 27.4 pg (ref 26.0–34.0)
MCHC: 30.7 g/dL (ref 30.0–36.0)
MCHC: 31.1 g/dL (ref 30.0–36.0)
MCV: 88.1 fL (ref 80.0–100.0)
MCV: 87.9 fL (ref 80.0–100.0)
Platelets: 365 10*3/uL (ref 150–400)
Platelets: 310 10*3/uL (ref 150–400)
RBC: 4.55 MIL/uL (ref 3.87–5.11)
RBC: 3.47 MIL/uL — ABNORMAL LOW (ref 3.87–5.11)
RDW: 20.8 % — ABNORMAL HIGH (ref 11.5–15.5)
RDW: 20.4 % — ABNORMAL HIGH (ref 11.5–15.5)
WBC: 4.3 10*3/uL (ref 4.0–10.5)
WBC: 6.2 10*3/uL (ref 4.0–10.5)
nRBC: 0 % (ref 0.0–0.2)
nRBC: 0 % (ref 0.0–0.2)

## 2022-07-16 LAB — COMPREHENSIVE METABOLIC PANEL
ALT: 18 U/L (ref 0–44)
AST: 57 U/L — ABNORMAL HIGH (ref 15–41)
Albumin: 2.8 g/dL — ABNORMAL LOW (ref 3.5–5.0)
Alkaline Phosphatase: 151 U/L — ABNORMAL HIGH (ref 38–126)
Anion gap: 16 — ABNORMAL HIGH (ref 5–15)
BUN: <5 mg/dL — ABNORMAL LOW (ref 8–23)
CO2: 20 mmol/L — ABNORMAL LOW (ref 22–32)
Calcium: 8.5 mg/dL — ABNORMAL LOW (ref 8.9–10.3)
Chloride: 104 mmol/L (ref 98–111)
Creatinine, Ser: 1.16 mg/dL — ABNORMAL HIGH (ref 0.44–1.00)
GFR, Estimated: 50 mL/min — ABNORMAL LOW (ref >60)
Glucose, Bld: 117 mg/dL — ABNORMAL HIGH (ref 70–99)
Potassium: 2.9 mmol/L — ABNORMAL LOW (ref 3.5–5.1)
Sodium: 140 mmol/L (ref 135–145)
Total Bilirubin: 0.6 mg/dL (ref 0.3–1.2)
Total Protein: 7.3 g/dL (ref 6.5–8.1)

## 2022-07-16 LAB — URINALYSIS, W/ REFLEX TO CULTURE (INFECTION SUSPECTED)
Bilirubin Urine: NEGATIVE
Glucose, UA: NEGATIVE mg/dL
Ketones, ur: NEGATIVE mg/dL
Nitrite: POSITIVE — AB
Protein, ur: 30 mg/dL — AB
Specific Gravity, Urine: 1.013 (ref 1.005–1.030)
WBC, UA: >50 WBC/hpf (ref 0–5)
pH: 5 (ref 5.0–8.0)

## 2022-07-16 LAB — PROTIME-INR
INR: 1.2 (ref 0.8–1.2)
Prothrombin Time: 15.7 seconds — ABNORMAL HIGH (ref 11.4–15.2)

## 2022-07-16 LAB — RESP PANEL BY RT-PCR (RSV, FLU A&B, COVID)  RVPGX2
Influenza A by PCR: NEGATIVE
Influenza B by PCR: NEGATIVE
Resp Syncytial Virus by PCR: NEGATIVE
SARS Coronavirus 2 by RT PCR: NEGATIVE

## 2022-07-16 LAB — APTT: aPTT: 27 seconds (ref 24–36)

## 2022-07-16 LAB — LACTIC ACID, PLASMA
Lactic Acid, Venous: 4.6 mmol/L (ref 0.5–1.9)
Lactic Acid, Venous: 3.7 mmol/L (ref 0.5–1.9)

## 2022-07-16 LAB — MAGNESIUM: Magnesium: 1.6 mg/dL — ABNORMAL LOW (ref 1.7–2.4)

## 2022-07-16 LAB — PROCALCITONIN: Procalcitonin: <0.1 ng/mL

## 2022-07-16 MED ORDER — VANCOMYCIN HCL IN DEXTROSE 1-5 GM/200ML-% IV SOLN
1000.0000 mg | Freq: Once | INTRAVENOUS | Status: AC
  Administered 2022-07-16: 1000 mg via INTRAVENOUS
  Filled 2022-07-16: qty 200

## 2022-07-16 MED ORDER — MIRTAZAPINE 15 MG PO TABS
15.0000 mg | ORAL_TABLET | Freq: Every day | ORAL | Status: DC
  Administered 2022-07-16 – 2022-07-20 (×5): 15 mg via ORAL
  Filled 2022-07-16 (×5): qty 1

## 2022-07-16 MED ORDER — POTASSIUM CHLORIDE 10 MEQ/100ML IV SOLN
10.0000 meq | INTRAVENOUS | Status: AC
  Administered 2022-07-16 (×3): 10 meq via INTRAVENOUS
  Filled 2022-07-16 (×3): qty 100

## 2022-07-16 MED ORDER — ENSURE ENLIVE PO LIQD
237.0000 mL | Freq: Three times a day (TID) | ORAL | Status: DC
  Administered 2022-07-17 – 2022-07-23 (×17): 237 mL via ORAL

## 2022-07-16 MED ORDER — METRONIDAZOLE 500 MG/100ML IV SOLN
500.0000 mg | Freq: Once | INTRAVENOUS | Status: AC
  Administered 2022-07-16: 500 mg via INTRAVENOUS
  Filled 2022-07-16: qty 100

## 2022-07-16 MED ORDER — SODIUM CHLORIDE 0.9 % IV SOLN
1.0000 g | INTRAVENOUS | Status: DC
  Administered 2022-07-16 – 2022-07-18 (×3): 1 g via INTRAVENOUS
  Filled 2022-07-16 (×3): qty 10

## 2022-07-16 MED ORDER — MAGNESIUM SULFATE 2 GM/50ML IV SOLN
2.0000 g | Freq: Once | INTRAVENOUS | Status: AC
  Administered 2022-07-16: 2 g via INTRAVENOUS
  Filled 2022-07-16: qty 50

## 2022-07-16 MED ORDER — LACTATED RINGERS IV BOLUS (SEPSIS)
250.0000 mL | Freq: Once | INTRAVENOUS | Status: AC
  Administered 2022-07-16: 250 mL via INTRAVENOUS

## 2022-07-16 MED ORDER — MEMANTINE HCL 5 MG PO TABS
10.0000 mg | ORAL_TABLET | Freq: Every day | ORAL | Status: DC
  Administered 2022-07-16 – 2022-07-17 (×2): 10 mg via ORAL
  Filled 2022-07-16 (×2): qty 2

## 2022-07-16 MED ORDER — DRONABINOL 2.5 MG PO CAPS
2.5000 mg | ORAL_CAPSULE | Freq: Two times a day (BID) | ORAL | Status: DC
  Administered 2022-07-16 – 2022-07-17 (×2): 2.5 mg via ORAL
  Filled 2022-07-16 (×2): qty 1

## 2022-07-16 MED ORDER — ENOXAPARIN SODIUM 30 MG/0.3ML IJ SOSY
30.0000 mg | PREFILLED_SYRINGE | INTRAMUSCULAR | Status: DC
  Administered 2022-07-16: 30 mg via SUBCUTANEOUS
  Filled 2022-07-16: qty 0.3

## 2022-07-16 MED ORDER — LACTATED RINGERS IV BOLUS (SEPSIS)
500.0000 mL | Freq: Once | INTRAVENOUS | Status: AC
  Administered 2022-07-16: 500 mL via INTRAVENOUS

## 2022-07-16 MED ORDER — LEVETIRACETAM 750 MG PO TABS
750.0000 mg | ORAL_TABLET | Freq: Two times a day (BID) | ORAL | Status: DC
  Administered 2022-07-16 – 2022-07-23 (×14): 750 mg via ORAL
  Filled 2022-07-16 (×14): qty 1

## 2022-07-16 MED ORDER — SODIUM CHLORIDE 0.9 % IV SOLN
2.0000 g | Freq: Once | INTRAVENOUS | Status: AC
  Administered 2022-07-16: 2 g via INTRAVENOUS
  Filled 2022-07-16: qty 12.5

## 2022-07-16 MED ORDER — DONEPEZIL HCL 5 MG PO TABS
10.0000 mg | ORAL_TABLET | Freq: Every day | ORAL | Status: DC
  Administered 2022-07-16 – 2022-07-20 (×5): 10 mg via ORAL
  Filled 2022-07-16 (×5): qty 2

## 2022-07-16 MED ORDER — KCL IN DEXTROSE-NACL 20-5-0.9 MEQ/L-%-% IV SOLN
INTRAVENOUS | Status: DC
  Filled 2022-07-16 (×3): qty 1000

## 2022-07-16 MED ORDER — LEVOTHYROXINE SODIUM 88 MCG PO TABS
88.0000 ug | ORAL_TABLET | Freq: Every day | ORAL | Status: DC
  Administered 2022-07-17 – 2022-07-23 (×7): 88 ug via ORAL
  Filled 2022-07-16 (×7): qty 1

## 2022-07-16 MED ORDER — LACTATED RINGERS IV SOLN: INTRAVENOUS | Status: DC

## 2022-07-16 MED ORDER — LACTATED RINGERS IV BOLUS (SEPSIS)
1000.0000 mL | Freq: Once | INTRAVENOUS | Status: AC
  Administered 2022-07-16: 1000 mL via INTRAVENOUS

## 2022-07-16 MED ORDER — PANCRELIPASE (LIP-PROT-AMYL) 12000-38000 UNITS PO CPEP
24000.0000 [IU] | ORAL_CAPSULE | Freq: Three times a day (TID) | ORAL | Status: DC
  Administered 2022-07-17 – 2022-07-23 (×17): 24000 [IU] via ORAL
  Filled 2022-07-16 (×19): qty 2

## 2022-07-16 MED ORDER — GABAPENTIN 300 MG PO CAPS
300.0000 mg | ORAL_CAPSULE | Freq: Two times a day (BID) | ORAL | Status: DC
  Administered 2022-07-16 – 2022-07-23 (×14): 300 mg via ORAL
  Filled 2022-07-16 (×14): qty 1

## 2022-07-16 NOTE — Sepsis Progress Note (Signed)
Sepsis protocol monitored by eLink ?

## 2022-07-16 NOTE — Progress Notes (Signed)
CODE SEPSIS - PHARMACY COMMUNICATION  **Broad Spectrum Antibiotics should be administered within 1 hour of Sepsis diagnosis**  Time Code Sepsis Called/Page Received: 1335  Antibiotics Ordered: cefepime and vancomycin   Time of 1st antibiotic administration: 1355  Additional action taken by pharmacy: N/A  If necessary, Name of Provider/Nurse Contacted: N/A   Gardner Candle, PharmD, BCPS Clinical Pharmacist 07/16/2022 1:39 PM

## 2022-07-16 NOTE — ED Notes (Signed)
Will start mag once have ordered maintenance fluids coming from pharmacy.

## 2022-07-16 NOTE — ED Notes (Signed)
1 set cultures, lactic drawn. Blue top sent to lab with basic labs.

## 2022-07-16 NOTE — ED Notes (Signed)
IV attempted in RFA.

## 2022-07-16 NOTE — ED Triage Notes (Signed)
Refer to First Nurse note. Pt is sitting in triage an not talking to staff. Pt keeps repeating with word sick.

## 2022-07-16 NOTE — ED Notes (Signed)
Husband and grandson at bedside. Pt repositioned to side lying.

## 2022-07-16 NOTE — ED Notes (Signed)
Pt to CT

## 2022-07-16 NOTE — ED Triage Notes (Addendum)
First Nurse:  Arrives from home via [facility].  Family concerned patient not taking meds and has not eated in a week.  VS wnl

## 2022-07-16 NOTE — ED Notes (Signed)
EDP aware lactic 4.6.

## 2022-07-16 NOTE — ED Notes (Signed)
First encounter with pt: Pt is poor historian. To ED from home EMS for weakness and failure to thrive and per family has not eaten for 1 week. Pt was very weak getting from chair to bed, required extensive help form 2 Rns. Pt repetitive, keeps stating "help me!" And "it hurts!". When asked, does not provide much information other than whole body hurts.

## 2022-07-16 NOTE — H&P (Addendum)
History and Physical    Patient: Stacy Grimes:[id number] DOB: 1950/11/03 DOA: 07/16/2022 DOS: the patient was seen and examined on 07/16/2022 PCP: Stacy Baseman, MD  Patient coming from: Home  Chief Complaint:  Chief Complaint  Patient presents with   Failure To Thrive   HPI: Stacy Grimes is a 72 y.o. 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  Patient was admitted to the hospital about 11 days ago for acute kidney failure secondary to dehydration.  Since discharge from hospital, patient has not been eating.  She has a very poor appetite, she barely drinks water.  She has a chronic diarrhea, but that has been slowing down since discharge.  Patient has been sleeping all day long, not ambulating.  Upon arriving the hospital, she was found to have severe hypokalemia with potassium 2.9, lactic acidosis of 4.6.  White cell count 4.3, creatinine 1.16.  CO2 20.  Magnesium 1.6.  Patient was given IV fluid bolus, IV potassium.  She also received broad-spectrum antibiotics in the emergency room. Review of Systems: As mentioned in the history of present illness. All other systems reviewed and are negative. Past Medical History:  Diagnosis Date   AAA (abdominal aortic aneurysm) (HCC)    Anginal pain (HCC)    Anxiety    Arthritis    Automatic implantable cardioverter-defibrillator in situ    Biventricular ICD  [employer]    a.  2011 s/p SJM CRT-D; b. 09/2016 gen change: SJM BiV AICD (ser# [id number]).   COPD (chronic obstructive pulmonary disease) (HCC)    Deafness    left ear   Dementia (HCC)    Depression    Dizziness    Fracture    history of spinal fracture    GERD (gastroesophageal reflux disease)    HFrEF (heart failure with reduced ejection fraction) (HCC)    a. 2010 Cath: nonobs dzs, EF 15-20%; b. 09/2017 Echo: EF suboptimal. EF low nl; c. 04/2018 Echo: EF 40-45%; d. 10/2021 Echo: EF 40-45%, glob HK, mildly red RV fxn, sev elev PASP, triv MR, mild-mod TR.   Hypertension    Hypothyroidism    Non-ischemic cardiomyopathy (HCC)    a. 2010 Cath: nonobs dzs, EF 15-20%; b. 2011 s/p SJM CRT-D; c. 09/2016 s/p Gen change; d. 09/2017 Echo: EF suboptimal. EF low nl; e. 04/2018 Echo: EF 40-45%; f. 10/2021 Echo: EF 40-45%, glob HK, mildly red RV fxn, sev elev PASP, triv MR, mild-mod TR.   Oxygen dependent    Palpitations    Sleep apnea    Past Surgical History:  Procedure Laterality Date   ABDOMINAL HYSTERECTOMY     BIV ICD GENERATOR CHANGEOUT N/A 09/17/2016   Procedure: BiV ICD Generator Changeout;  Surgeon: Duke Salvia, MD;  Location: [facility] INVASIVE CV LAB;  Service: Cardiovascular;  Laterality: N/A;   BLADDER SURGERY     CARDIAC CATHETERIZATION     CARDIAC DEFIBRILLATOR PLACEMENT  4 yrs ago   pacemaker   CATARACT EXTRACTION W/PHACO Left 09/01/2014   Procedure: CATARACT EXTRACTION PHACO AND INTRAOCULAR LENS PLACEMENT (IOC);  Surgeon: Stacy Hopping, MD;  Location: [facility] ORS;  Service: Ophthalmology;  Laterality: Left;  [place]: 1:12.1    CATARACT EXTRACTION W/PHACO Right 09/29/2014   Procedure: CATARACT EXTRACTION PHACO AND INTRAOCULAR LENS  PLACEMENT (IOC);  Surgeon: Stacy Hopping, MD;  Location: [facility] ORS;  Service: Ophthalmology;  Laterality: Right;  [place]: 00:52.7    CHOLECYSTECTOMY     COLON SURGERY     COLONOSCOPY     COLONOSCOPY N/A 08/31/2012   Procedure: COLONOSCOPY;  Surgeon: Stacy Meckel, MD;  Location: [facility] ENDOSCOPY;  Service: Endoscopy;  Laterality: N/A;   ESOPHAGOGASTRODUODENOSCOPY N/A 07/06/2012   Procedure: ESOPHAGOGASTRODUODENOSCOPY (EGD);  Surgeon: Stacy Carwin, MD;  Location: [facility];  Service: Endoscopy;  Laterality: N/A;   INTRAMEDULLARY  (IM) NAIL INTERTROCHANTERIC Right 10/23/2021   Procedure: INTRAMEDULLARY (IM) NAIL INTERTROCHANTERIC;  Surgeon: Stacy Flake, MD;  Location: [facility] ORS;  Service: Orthopedics;  Laterality: Right;   KNEE SURGERY Right    MIDDLE EAR SURGERY     SAVORY DILATION N/A 07/06/2012   Procedure: SAVORY DILATION;  Surgeon: Stacy Carwin, MD;  Location: [facility] ENDOSCOPY;  Service: Endoscopy;  Laterality: N/A;   TEE WITHOUT CARDIOVERSION N/A 06/23/2020   Procedure: TRANSESOPHAGEAL ECHOCARDIOGRAM (TEE);  Surgeon: Stacy Iba, MD;  Location: [facility] ORS;  Service: Cardiovascular;  Laterality: N/A;   WRIST SURGERY Right    Social History:  reports that she quit smoking about 20 years ago. Her smoking use included cigarettes. She has a 60.00 pack-year smoking history. She has never used smokeless tobacco. She reports that she does not drink alcohol and does not use drugs.  Allergies  Allergen Reactions   Codeine Palpitations   Contrast Media [Iodinated Contrast Media] Rash    Family History  Problem Relation Age of Onset   Hypertension Mother    Breast cancer Mother    Stroke Mother    Heart disease Father    Heart attack Sister    Hypertension Brother    Heart attack Sister    Colon cancer Brother 41    Prior to Admission medications   Medication Sig Start Date End Date Taking? Authorizing Provider  acetaminophen (TYLENOL) 650 MG CR tablet Take 650 mg by mouth at bedtime.   Yes [provider]  albuterol (ACCUNEB) 0.63 MG/3ML nebulizer solution Take 1 ampule by nebulization every 6 (six) hours as needed for wheezing.   Yes [provider]  albuterol (PROVENTIL HFA;VENTOLIN HFA) 108 (90 Base) MCG/ACT inhaler Inhale 2 puffs into the lungs every 6 (six) hours as needed. 11/02/16  Yes Schaevitz, Myra Rude, MD  allopurinol (ZYLOPRIM) 100 MG tablet Take 0.5 tablets (50 mg total) by mouth daily. 04/25/22  Yes Gillis Santa, MD  ascorbic acid (VITAMIN C) 500 MG tablet Take 1 tablet (500 mg  total) by mouth 2 (two) times daily. 07/05/22  Yes Enedina Finner, MD  atorvastatin (LIPITOR) 80 MG tablet Take 1 tablet (80 mg total) by mouth daily. 07/12/21 07/16/22 Yes Iran Ouch, MD  budesonide-formoterol (SYMBICORT) 160-4.5 MCG/ACT inhaler Inhale 2 puffs into the lungs 2 (two) times daily.   Yes [provider]  cholestyramine light (PREVALITE) 4 g packet Take 0.5 packets (2 g total) by mouth 3 (three) times daily. 07/05/22  Yes Enedina Finner, MD  clopidogrel (PLAVIX) 75 MG tablet Take 1 tablet (75 mg total) by mouth daily. 07/12/21  Yes Iran Ouch, MD  donepezil (ARICEPT) 10 MG tablet Take 10 mg by mouth at bedtime.   Yes [provider]  folic acid (FOLVITE) 1 MG tablet Take 1 tablet (1 mg total) by mouth daily. 04/26/22 07/25/22 Yes Gillis Santa, MD  gabapentin (NEURONTIN) 100 MG capsule Take 3 capsules (300 mg total) by mouth  2 (two) times daily. 04/25/22 07/24/22 Yes Gillis Santa, MD  glucose 4 GM chewable tablet Chew 1 tablet (4 g total) by mouth 4 (four) times daily. 07/05/22  Yes Enedina Finner, MD  hydrocortisone (ANUSOL-HC) 25 MG suppository Place 1 suppository (25 mg total) rectally 2 (two) times daily as needed for hemorrhoids or anal itching. 04/25/22  Yes Gillis Santa, MD  hydrOXYzine (VISTARIL) 25 MG capsule Take 25 mg by mouth at bedtime as needed. 02/13/22  Yes [provider]  levETIRAcetam (KEPPRA) 750 MG tablet Take 1 tablet (750 mg total) by mouth 2 (two) times daily. 04/24/22 04/24/23 Yes Gillis Santa, MD  levothyroxine (SYNTHROID) 88 MCG tablet Take 1 tablet (88 mcg total) by mouth daily at 6 (six) AM. 07/06/22  Yes Enedina Finner, MD  loperamide (IMODIUM) 2 MG capsule Take 1 capsule (2 mg total) by mouth 4 (four) times daily as needed for diarrhea or loose stools. 07/05/22  Yes Enedina Finner, MD  memantine (NAMENDA) 10 MG tablet Take 10 mg by mouth in the morning and at bedtime. 07/10/21  Yes [provider]  mirtazapine (REMERON) 15 MG tablet Take 1  tablet (15 mg total) by mouth at bedtime. 07/06/22  Yes Enedina Finner, MD  Multiple Vitamin (MULTIVITAMIN WITH MINERALS) TABS tablet Take 1 tablet by mouth daily with lunch. 07/05/22  Yes Enedina Finner, MD  polyvinyl alcohol (LIQUIFILM TEARS) 1.4 % ophthalmic solution Place 1 drop into both eyes as needed for dry eyes. 03/10/22  Yes Alford Highland, MD  tiotropium (SPIRIVA) 18 MCG inhalation capsule Place 1 capsule (18 mcg total) into inhaler and inhale daily as needed (shortness of breath). 04/12/18  Yes Enid Baas, MD  famotidine (PEPCID) 20 MG tablet Take 1 tablet (20 mg total) by mouth daily. 03/10/22 04/11/22  Alford Highland, MD  feeding supplement (ENSURE ENLIVE / ENSURE PLUS) LIQD Take 237 mLs by mouth 3 (three) times daily between meals. 07/05/22   Enedina Finner, MD  metoprolol tartrate (LOPRESSOR) 25 MG tablet Take 0.5 tablets (12.5 mg total) by mouth 2 (two) times daily. Hold if SBP <110 mmHg and or HR <65 Patient not taking: Reported on 07/16/2022 04/25/22 07/24/22  Gillis Santa, MD  midodrine (PROAMATINE) 5 MG tablet Take 1 tablet (5 mg total) by mouth 3 (three) times daily with meals. Hold if systolic BP >110 Patient not taking: Reported on 07/16/2022 04/25/22 07/24/22  Gillis Santa, MD  Nutritional Supplements (,FEEDING SUPPLEMENT, PROSOURCE PLUS) liquid Take 30 mLs by mouth 2 (two) times daily between meals. 07/05/22   Enedina Finner, MD  ondansetron (ZOFRAN) 4 MG tablet Take 1 tablet (4 mg total) by mouth every 8 (eight) hours as needed for nausea or vomiting. Patient not taking: Reported on 07/16/2022 03/10/22   Alford Highland, MD  traZODone (DESYREL) 50 MG tablet Take 1 tablet (50 mg total) by mouth at bedtime as needed for sleep. Patient not taking: Reported on 07/16/2022 04/25/22   Gillis Santa, MD  Vitamin D, Ergocalciferol, (DRISDOL) 1.25 MG (50000 UNIT) CAPS capsule Take 1 capsule (50,000 Units total) by mouth every 7 (seven) days. 04/27/22 07/26/22  Gillis Santa, MD    Physical Exam: Vitals:    07/16/22 1430 07/16/22 1445 07/16/22 1500 07/16/22 1530  BP: 118/73  101/71 97/78  Pulse:  91 93 88  Resp:  18  (!) 21  Temp:      TempSrc:      SpO2:  94% 99% 97%  Weight:       Physical Exam Constitutional:  General: She is not in acute distress.    Appearance: She is toxic-appearing. She is not ill-appearing.     Comments: Drowsy.  Cachectic.  HENT:     Head: Normocephalic and atraumatic.     Nose: Nose normal.     Mouth/Throat:     Mouth: Mucous membranes are dry.     Pharynx: No oropharyngeal exudate.  Eyes:     Extraocular Movements: Extraocular movements intact.     Conjunctiva/sclera: Conjunctivae normal.     Pupils: Pupils are equal, round, and reactive to light.  Cardiovascular:     Rate and Rhythm: Normal rate and regular rhythm.     Heart sounds: No murmur heard.    No gallop.  Pulmonary:     Effort: Pulmonary effort is normal.     Comments: Decreased breath sounds without crackles or wheezes. Abdominal:     General: Abdomen is flat. There is no distension.     Palpations: Abdomen is soft. There is no mass.     Tenderness: There is no abdominal tenderness. There is no guarding or rebound.  Musculoskeletal:        General: No swelling or tenderness. Normal range of motion.     Cervical back: Normal range of motion and neck supple.     Comments: Severe muscle atrophy.  Skin:    General: Skin is warm and dry.     Coloration: Skin is not jaundiced.  Neurological:     General: No focal deficit present.     Comments: Oriented to place and person.  Psychiatric:     Comments: Flat affect.     Data Reviewed:  CT scan abdomen/pelvis showed diverticulosis without diverticulitis.  Assessment and Plan: Intractable nausea vomiting. Chronic diarrhea. Hypokalemia secondary to nausea vomiting. Hypomagnesemia secondary nausea vomiting. Patient has a severe dehydration from poor p.o. intake.  She has significant hypokalemia and hypomagnesemia.  Will continue  IV fluids, replete potassium magnesium. Patient also has chronic diarrhea, could be from malabsorption.  Will add Creon.  Lactic acidosis. Patient had a severe lactic acidosis, appears to be secondary to severe dehydration.  I do not suspect any infectious time, however, I will send a UA and a urine culture to in case patient has a UTI.  No evidence of pneumonia at this time. Lactic acid is improving after fluid bolus.  She has received IV antibiotics, I will hold off additional dose, follow-up with the blood culture. Addendum: UA positive, culture sent out, start rocephin.   Type 2 diabetes with hypoglycemia. Hypothyroidism. Patient had a significant hypoglycemia most likely due to poor p.o. intake.  Will need to rule out adrenal insufficiency, also recheck thyroid function.  Most recently, patient appeared to have reduced TSH and increased free T4.  May need to reduce the dose of Synthroid. HbA!c 5.9  Failure to thrive. Severe protein calorie malnutrition. Anorexia. Patient has very poor appetite, significant weight loss.  She appeared to have severe malnutrition. I will add Marinol to try to stimulate appetite.  Also added pancreatic enzyme. Patient eventually will need EGD as outpatient.  Chronic systolic congestive heart failure. Status post ICD. Patient currently appears very dehydrated, will start a gentle rehydration.  Monitor for volume status to avoid exacerbation congestive heart failure.  COPD. Chronic hypoxemic respite failure secondary to COPD. Continues on bronchodilator, continue oxygen treatment as needed.  Seizure disorder Dementia with behavioral disturbance. History of stroke. Continue some home medicines.   Advance Care Planning:   Code Status: Full  Code discussed with patient and husband, patient is a full code.  Consults: None  Family Communication: Husband and grandson at bedside.  Severity of Illness: The appropriate patient status for this patient is  OBSERVATION. Observation status is judged to be reasonable and necessary in order to provide the required intensity of service to ensure the patient's safety. The patient's presenting symptoms, physical exam findings, and initial radiographic and laboratory data in the context of their medical condition is felt to place them at decreased risk for further clinical deterioration. Furthermore, it is anticipated that the patient will be medically stable for discharge from the hospital within 2 midnights of admission.   Author: Marrion Coy, MD 07/16/2022 4:31 PM  For on call review [email or web address].

## 2022-07-16 NOTE — ED Notes (Signed)
Pt has ready bed no RN since over 30 minutes. Called upstairs, placed on hold. Was unable to stay on hold and had to hang up. Will call back.

## 2022-07-16 NOTE — ED Provider Notes (Signed)
[facility] Provider Note    Event Date/Time   First MD Initiated Contact with Patient 07/16/22 1256     (approximate)   History   Failure To Thrive   HPI  Stacy Grimes is a 72 y.o. female   here with failure to thrive and generalized weakness.  History provided primarily by EMS report due to patient confusion.  Per report, the patient has had persistently worsening nausea, vomiting, and very minimal p.o. intake over the last week or so.  Family became concerned due to her increasing weakness as well as a desire for placing a feeding tube per report.  Patient tells me that she feels weak but is unable to further elaborate.  She denies any major abdominal pain.      Physical Exam   Triage Vital Signs: ED Triage Vitals  Enc Vitals Group     BP 07/16/22 1244 (!) 85/60     Pulse Rate 07/16/22 1244 80     Resp 07/16/22 1244 19     Temp 07/16/22 1244 97.8 F (36.6 C)     Temp Source 07/16/22 1244 Axillary     SpO2 07/16/22 1244 98 %     Weight 07/16/22 1248 125 lb (56.7 kg)     Height --      Head Circumference --      Peak Flow --      Pain Score 07/16/22 1247 0     Pain Loc --      Pain Edu? --      Excl. in GC? --     Most recent vital signs: Vitals:   07/16/22 1715 07/16/22 1831  BP:  113/72  Pulse: 98 87  Resp: 17 18  Temp:  98.5 F (36.9 C)  SpO2: 97% 96%     General: Awake, appears to feel unwell but nontoxic. CV:  Good peripheral perfusion.  Tachycardia with any kind of movement. Resp:  Normal work of breathing.  Lungs clear. Abd:  No distention.  Mild diffuse tenderness.  No rebound or guarding. Other:  Dry mucous membranes.   ED Results / Procedures / Treatments   Labs (all labs ordered are listed, but only abnormal results are displayed) Labs Reviewed  COMPREHENSIVE METABOLIC PANEL - Abnormal; Notable for the following components:      Result Value   Potassium 2.9 (*)    CO2 20 (*)    Glucose, Bld 117 (*)    BUN  <5 (*)    Creatinine, Ser 1.16 (*)    Calcium 8.5 (*)    Albumin 2.8 (*)    AST 57 (*)    Alkaline Phosphatase 151 (*)    GFR, Estimated 50 (*)    Anion gap 16 (*)    All other components within normal limits  CBC - Abnormal; Notable for the following components:   RDW 20.8 (*)    All other components within normal limits  LACTIC ACID, PLASMA - Abnormal; Notable for the following components:   Lactic Acid, Venous 4.6 (*)    All other components within normal limits  LACTIC ACID, PLASMA - Abnormal; Notable for the following components:   Lactic Acid, Venous 3.7 (*)    All other components within normal limits  PROTIME-INR - Abnormal; Notable for the following components:   Prothrombin Time 15.7 (*)    All other components within normal limits  URINALYSIS, W/ REFLEX TO CULTURE (INFECTION SUSPECTED) - Abnormal; Notable for the following components:  Color, Urine AMBER (*)    APPearance CLOUDY (*)    Hgb urine dipstick SMALL (*)    Protein, ur 30 (*)    Nitrite POSITIVE (*)    Leukocytes,Ua LARGE (*)    Bacteria, UA MANY (*)    All other components within normal limits  MAGNESIUM - Abnormal; Notable for the following components:   Magnesium 1.6 (*)    All other components within normal limits  CBC - Abnormal; Notable for the following components:   RBC 3.47 (*)    Hemoglobin 9.5 (*)    HCT 30.5 (*)    RDW 20.4 (*)    All other components within normal limits  RESP PANEL BY RT-PCR (RSV, FLU A&B, COVID)  [id number]  CULTURE, BLOOD (ROUTINE X 2)  CULTURE, BLOOD (ROUTINE X 2)  URINE CULTURE  APTT  PROCALCITONIN  COMPREHENSIVE METABOLIC PANEL  MAGNESIUM  PHOSPHORUS  CORTISOL-AM, BLOOD  TSH  T4, FREE     EKG Normal sinus rhythm, ventricular rate 90.  PR 151, QRS 170, QTc 554.  No acute ST elevations repress been acute evidence of acute ischemic infarct.   RADIOLOGY CXR: No active disease CT A/P: Pending, no acute process on my prelim review   I also independently  reviewed and agree with radiologist interpretations.   PROCEDURES:  Critical Care performed: Yes, see critical care procedure note(s)  .Critical Care  Performed by: Shaune Pollack, MD Authorized by: Shaune Pollack, MD   Critical care provider statement:    Critical care time (minutes):  30   Critical care time was exclusive of:  Separately billable procedures and treating other patients   Critical care was necessary to treat or prevent imminent or life-threatening deterioration of the following conditions:  Cardiac failure, circulatory failure, respiratory failure and sepsis   Critical care was time spent personally by me on the following activities:  Development of treatment plan with patient or surrogate, discussions with consultants, evaluation of patient's response to treatment, examination of patient, ordering and review of laboratory studies, ordering and review of radiographic studies, ordering and performing treatments and interventions, pulse oximetry, re-evaluation of patient's condition and review of old charts     MEDICATIONS ORDERED IN ED: Medications  donepezil (ARICEPT) tablet 10 mg (has no administration in time range)  memantine (NAMENDA) tablet 10 mg (10 mg Oral Given 07/16/22 1727)  mirtazapine (REMERON) tablet 15 mg (has no administration in time range)  levothyroxine (SYNTHROID) tablet 88 mcg (has no administration in time range)  levETIRAcetam (KEPPRA) tablet 750 mg (has no administration in time range)  gabapentin (NEURONTIN) capsule 300 mg (has no administration in time range)  enoxaparin (LOVENOX) injection 30 mg (has no administration in time range)  dextrose 5 % and 0.9 % NaCl with KCl 20 mEq/L infusion ( Intravenous New Bag/Given 07/16/22 1732)  dronabinol (MARINOL) capsule 2.5 mg (2.5 mg Oral Given 07/16/22 1728)  lipase/protease/amylase (CREON) capsule 24,000 Units (24,000 Units Oral Not Given 07/16/22 1800)  feeding supplement (ENSURE ENLIVE / ENSURE PLUS)  liquid 237 mL (has no administration in time range)  cefTRIAXone (ROCEPHIN) 1 g in sodium chloride 0.9 % 100 mL IVPB (has no administration in time range)  ceFEPIme (MAXIPIME) 2 g in sodium chloride 0.9 % 100 mL IVPB (0 g Intravenous Stopped 07/16/22 1424)  metroNIDAZOLE (FLAGYL) IVPB 500 mg (0 mg Intravenous Stopped 07/16/22 1533)  vancomycin (VANCOCIN) IVPB 1000 mg/200 mL premix (0 mg Intravenous Stopped 07/16/22 1645)  lactated ringers bolus 1,000 mL (0 mLs Intravenous  Stopped 07/16/22 1644)    And  lactated ringers bolus 500 mL (0 mLs Intravenous Stopped 07/16/22 1731)    And  lactated ringers bolus 250 mL (0 mLs Intravenous Stopped 07/16/22 1732)  potassium chloride 10 mEq in 100 mL IVPB (0 mEq Intravenous Stopped 07/16/22 1909)  magnesium sulfate IVPB 2 g 50 mL (0 g Intravenous Stopped 07/16/22 1909)     IMPRESSION / MDM / ASSESSMENT AND PLAN / ED COURSE  I reviewed the triage vital signs and the nursing notes.                              Differential diagnosis includes, but is not limited to, sepsis from UTI, colitis, enteritis, food-borne illness, ACS, renal failure  Patient's presentation is most consistent with acute presentation with potential threat to life or bodily function.  The patient is on the cardiac monitor to evaluate for evidence of arrhythmia and/or significant heart rate changes   72 year old female here with generalized weakness, nausea, vomiting, inability to tolerate p.o.  Patient appears significantly dehydrated on exam.  CBC reveals hemoconcentration.  Lactic acid elevated at 4.6.  Broad-spectrum antibiotics and IV fluids started although I suspect this could be secondary to her level of dehydration.  Urinalysis pending.  CT pending.  Plan to admit for lactic acidosis and significant dehydration with failure to thrive.   FINAL CLINICAL IMPRESSION(S) / ED DIAGNOSES   Final diagnoses:  Lactic acidosis  Dehydration     Rx / DC Orders   ED Discharge Orders      None        Note:  This document was prepared using Dragon voice recognition software and may include unintentional dictation errors.   Shaune Pollack, MD 07/16/22 1932

## 2022-07-16 NOTE — Progress Notes (Signed)
PHARMACY -  BRIEF ANTIBIOTIC NOTE   Pharmacy has received consult(s) for cefepime and vancomycin  from an ED provider.  The patient's profile has been reviewed for ht/wt/allergies/indication/available labs.    One time order(s) placed for Vancomycin 1g and cefepime 2g  Further antibiotics/pharmacy consults should be ordered by admitting physician if indicated.                       Thank you, Gardner Candle, PharmD, BCPS Clinical Pharmacist 07/16/2022 1:38 PM

## 2022-07-17 DIAGNOSIS — G9341 Metabolic encephalopathy: Secondary | ICD-10-CM | POA: Diagnosis not present

## 2022-07-17 DIAGNOSIS — J9611 Chronic respiratory failure with hypoxia: Secondary | ICD-10-CM | POA: Diagnosis not present

## 2022-07-17 DIAGNOSIS — R112 Nausea with vomiting, unspecified: Principal | ICD-10-CM

## 2022-07-17 LAB — COMPREHENSIVE METABOLIC PANEL
ALT: 13 U/L (ref 0–44)
AST: 32 U/L (ref 15–41)
Albumin: 2.1 g/dL — ABNORMAL LOW (ref 3.5–5.0)
Alkaline Phosphatase: 117 U/L (ref 38–126)
Anion gap: 8 (ref 5–15)
BUN: <5 mg/dL — ABNORMAL LOW (ref 8–23)
CO2: 23 mmol/L (ref 22–32)
Calcium: 6.9 mg/dL — ABNORMAL LOW (ref 8.9–10.3)
Chloride: 113 mmol/L — ABNORMAL HIGH (ref 98–111)
Creatinine, Ser: 0.93 mg/dL (ref 0.44–1.00)
GFR, Estimated: >60 mL/min (ref >60)
Glucose, Bld: 94 mg/dL (ref 70–99)
Potassium: 3.1 mmol/L — ABNORMAL LOW (ref 3.5–5.1)
Sodium: 144 mmol/L (ref 135–145)
Total Bilirubin: 0.5 mg/dL (ref 0.3–1.2)
Total Protein: 5.4 g/dL — ABNORMAL LOW (ref 6.5–8.1)

## 2022-07-17 LAB — TSH: TSH: 2.499 u[IU]/mL (ref 0.350–4.500)

## 2022-07-17 LAB — GLUCOSE, CAPILLARY
Glucose-Capillary: 84 mg/dL (ref 70–99)
Glucose-Capillary: 85 mg/dL (ref 70–99)

## 2022-07-17 LAB — MAGNESIUM: Magnesium: 2.1 mg/dL (ref 1.7–2.4)

## 2022-07-17 LAB — URINE CULTURE

## 2022-07-17 LAB — PHOSPHORUS: Phosphorus: 1.6 mg/dL — ABNORMAL LOW (ref 2.5–4.6)

## 2022-07-17 LAB — CULTURE, BLOOD (ROUTINE X 2)
Culture: NO GROWTH
Special Requests: ADEQUATE

## 2022-07-17 LAB — T4, FREE: Free T4: 0.83 ng/dL (ref 0.61–1.12)

## 2022-07-17 LAB — CORTISOL-AM, BLOOD: Cortisol - AM: 11.7 ug/dL (ref 6.7–22.6)

## 2022-07-17 MED ORDER — INSULIN ASPART 100 UNIT/ML IJ SOLN: 0.0000 [IU] | Freq: Three times a day (TID) | INTRAMUSCULAR | Status: DC

## 2022-07-17 MED ORDER — CLOPIDOGREL BISULFATE 75 MG PO TABS
75.0000 mg | ORAL_TABLET | Freq: Every day | ORAL | Status: DC
  Administered 2022-07-17 – 2022-07-23 (×7): 75 mg via ORAL
  Filled 2022-07-17 (×7): qty 1

## 2022-07-17 MED ORDER — POTASSIUM CHLORIDE CRYS ER 20 MEQ PO TBCR
40.0000 meq | EXTENDED_RELEASE_TABLET | ORAL | Status: AC
  Administered 2022-07-17 (×2): 40 meq via ORAL
  Filled 2022-07-17 (×2): qty 2

## 2022-07-17 MED ORDER — LORAZEPAM 0.5 MG PO TABS
0.5000 mg | ORAL_TABLET | Freq: Four times a day (QID) | ORAL | Status: DC | PRN
  Administered 2022-07-17: 0.5 mg via ORAL
  Filled 2022-07-17: qty 1

## 2022-07-17 MED ORDER — KCL-LACTATED RINGERS-D5W 20 MEQ/L IV SOLN
INTRAVENOUS | Status: DC
  Filled 2022-07-17 (×2): qty 1000

## 2022-07-17 MED ORDER — ATORVASTATIN CALCIUM 20 MG PO TABS
80.0000 mg | ORAL_TABLET | Freq: Every day | ORAL | Status: DC
  Administered 2022-07-17 – 2022-07-23 (×7): 80 mg via ORAL
  Filled 2022-07-17 (×7): qty 4

## 2022-07-17 MED ORDER — POTASSIUM PHOSPHATES 15 MMOLE/5ML IV SOLN
30.0000 mmol | Freq: Once | INTRAVENOUS | Status: AC
  Administered 2022-07-17: 30 mmol via INTRAVENOUS
  Filled 2022-07-17: qty 10

## 2022-07-17 MED ORDER — ENOXAPARIN SODIUM 40 MG/0.4ML IJ SOSY
40.0000 mg | PREFILLED_SYRINGE | INTRAMUSCULAR | Status: DC
  Administered 2022-07-17 – 2022-07-22 (×6): 40 mg via SUBCUTANEOUS
  Filled 2022-07-17 (×6): qty 0.4

## 2022-07-17 NOTE — Hospital Course (Addendum)
Stacy Grimes is a 72 y.o. 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  Patient had minimal intake for 11 days.  Upon arriving the hospital, patient had a acute kidney injury, lactic acidosis, potassium 2.9.  Patient was also placed on Rocephin for urinary tract infection.  Was given IV fluids and replete potassium, magnesium.  6/17: Blood sugar dropped again 60s.  Started on D5 water.

## 2022-07-17 NOTE — Progress Notes (Signed)
   We received an urgent referral from the pt's PCP office this am. Jan called our office from [facility] and [facility] for [place] to assist with pt and her dementia, COPD and aggressive behaviors that the husband reported to them about.   I have called and discussed goals of care with the pt spouse [name]. At this time he wants pt to remain Full code, would be interested in pt receiving a feeding tube if needed and does not want to de-activate her ICD. He feels that she still has quality of life as long as she is aware of who she is and is oriented. He knows she has declined since November of last year and has had many set backs with multiple hospitalizations from UTI, seizures to CHF exacerbations. He would be receptive to [facility]. This is a home based Palliative Care program that is provided by [facility] and [employer]. We will reach out to his PCP and update them of the conversations and get orders for this at d/c from hospital.   If the pt declines or if the family goals change please update [place] so that we can assist with any d/c needs.   Norm Parcel RN BSN [facility] [phone]

## 2022-07-17 NOTE — Progress Notes (Signed)
Progress Note   Patient: Stacy Grimes:[id number] DOB: 1950-12-25 DOA: 07/16/2022     1 DOS: the patient was seen and examined on 07/17/2022   Brief hospital course: Stacy Grimes is a 72 y.o. 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  Patient had minimal intake for 11 days.  Upon arriving the hospital, patient had a acute kidney injury, lactic acidosis, potassium 2.9.  Patient was also placed on Rocephin for urinary tract infection.  Was given IV fluids and replete potassium magnesium.   Principal Problem:   Intractable nausea and vomiting Active Problems:   Seizure disorder (HCC)   Acute metabolic encephalopathy   Hypomagnesemia   AKI (acute kidney injury) (HCC)   Hypokalemia   Chronic systolic CHF (congestive heart failure) (HCC)   Chronic obstructive pulmonary disease, unspecified COPD type (HCC)   Chronic respiratory failure with hypoxia (HCC)   Dementia with behavioral disturbance (HCC)   Protein-calorie malnutrition, severe (HCC)   ICD (implantable cardioverter-defibrillator) in place   Lactic acidosis   Hypoglycemia associated with type 2 diabetes mellitus (HCC)   Failure to thrive in adult   Anorexia   History of stroke   Hypophosphatemia   Assessment and Plan:  Intractable nausea vomiting. Chronic diarrhea. Hypokalemia secondary to nausea vomiting. Hypomagnesemia secondary nausea vomiting. Hypophosphatemia. Acute kidney injury. Patient had acute kidney injury at time of admission, received a fluid bolus, renal function has normalized.  Patient also received potassium, still low, will give more potassium.  Also give potassium  phosphate. Due to poor p.o. intake, will continue some IV fluids with added potassium.   Lactic acidosis. Urinary tract infection. Sepsis is ruled out. Patient had a severe lactic acidosis, appears to be secondary to severe dehydration. UA positive,start rocephin.  Work on patient did not meet sepsis criteria at time of admission.  Urine culture pending.  Blood cultures so far has no growth.  Acute metabolic encephalopathy. Patient still very drowsy, hard to wake up.  This appears to be secondary to her medical problems.  It does not appear that this is at her baseline.  Continue to monitor closely.   Type 2 diabetes with hypoglycemia. Hypothyroidism. TSH and cortisol level within normal limits.  Low glucose due to poor p.o. intake.  Continue IV fluids with D5.  Also added sliding scale insulin at a lower dose.   Failure to thrive. Severe protein calorie malnutrition. Anorexia. Continue protein supplements, cannot take Marinol as patient already very sleepy.   Chronic systolic congestive heart failure. Status post ICD. Gentle rehydration to avoid exacerbation of congestive heart failure.   COPD. Chronic hypoxemic respite failure secondary to COPD. Continues on bronchodilator, continue oxygen treatment as needed.   Seizure disorder Dementia with behavioral disturbance. History of stroke. Continue some home medicines.      Subjective:  Patient is very sleepy today, hard to wake up. She started have diarrhea, no longer nausea vomiting.  But appetite is very poor.  Physical Exam: Vitals:   07/16/22 1831 07/17/22 0338 07/17/22 0342 07/17/22 0828  BP: 113/72  (!) 119/55 111/63  Pulse: 87 68 69 74  Resp: 18  18 18   Temp: 98.5 F (36.9 C)  98.3 F (36.8 C) 97.9 F (36.6 C)  TempSrc: Oral  Axillary Oral  SpO2: 96% 90% 96% 96%  Weight:       General exam: Appears calm and comfortable, cachectic. Respiratory system: Clear to auscultation. Respiratory effort  normal. Cardiovascular system: S1 & S2 heard, RRR. No JVD, murmurs, rubs, gallops or clicks. No pedal edema. Gastrointestinal system: Abdomen is nondistended, soft and nontender. No organomegaly or masses felt. Normal bowel sounds heard. Central nervous system: Very drowsy oriented x 2. Extremities: Muscle atrophy.. Skin: No rashes, lesions or ulcers Psychiatry: Flat affect.   Data Reviewed:  Lab results reviewed.  Family Communication: None  Disposition: Status is: Inpatient Remains inpatient appropriate because: Verity of disease, IV treatment.     Time spent: 35 minutes  Author: Marrion Coy, MD 07/17/2022 2:33 PM  For on call review [email or web address].

## 2022-07-17 NOTE — Progress Notes (Addendum)
Pt has a continous IV fluids and IV abx and is very hard stick. IV team staff was trying to placed a new IV but pt is not cooperative and start swinging and almost stuck IV team. MD Mansy made aware. Will continue to monitor.  Update 2020: MD Mansy placed order. Will continue to monitor.

## 2022-07-17 NOTE — Progress Notes (Signed)
PHARMACIST - PHYSICIAN COMMUNICATION  CONCERNING:  Enoxaparin (Lovenox) for DVT Prophylaxis    RECOMMENDATION: Patient was prescribed enoxaparin 30mg  q24 hours for VTE prophylaxis.   Filed Weights   07/16/22 1248  Weight: 56.7 kg (125 lb)    Body mass index is 22.14 kg/m.  Estimated Creatinine Clearance: 44.6 mL/min (by C-G formula based on SCr of 0.93 mg/dL).   Patient is candidate for enoxaparin 40mg  every 24 hours based on CrCl >46ml/min and Weight >45kg  DESCRIPTION: Pharmacy has adjusted enoxaparin dose per [facility] policy.  Patient is now receiving enoxaparin 40 mg every 24 hours    Tressie Ellis 07/17/2022 8:35 AM

## 2022-07-18 DIAGNOSIS — G9341 Metabolic encephalopathy: Secondary | ICD-10-CM | POA: Diagnosis not present

## 2022-07-18 DIAGNOSIS — R627 Adult failure to thrive: Secondary | ICD-10-CM

## 2022-07-18 DIAGNOSIS — R112 Nausea with vomiting, unspecified: Secondary | ICD-10-CM | POA: Diagnosis not present

## 2022-07-18 DIAGNOSIS — E872 Acidosis, unspecified: Secondary | ICD-10-CM | POA: Insufficient documentation

## 2022-07-18 LAB — CBC
HCT: 34.9 % — ABNORMAL LOW (ref 36.0–46.0)
Hemoglobin: 10.5 g/dL — ABNORMAL LOW (ref 12.0–15.0)
MCH: 27.5 pg (ref 26.0–34.0)
MCHC: 30.1 g/dL (ref 30.0–36.0)
MCV: 91.4 fL (ref 80.0–100.0)
Platelets: 242 10*3/uL (ref 150–400)
RBC: 3.82 MIL/uL — ABNORMAL LOW (ref 3.87–5.11)
RDW: 21.2 % — ABNORMAL HIGH (ref 11.5–15.5)
WBC: 5.1 10*3/uL (ref 4.0–10.5)
nRBC: 0 % (ref 0.0–0.2)

## 2022-07-18 LAB — GASTROINTESTINAL PANEL BY PCR, STOOL (REPLACES STOOL CULTURE)

## 2022-07-18 LAB — MAGNESIUM: Magnesium: 2 mg/dL (ref 1.7–2.4)

## 2022-07-18 LAB — BASIC METABOLIC PANEL
Anion gap: 10 (ref 5–15)
BUN: <5 mg/dL — ABNORMAL LOW (ref 8–23)
CO2: 17 mmol/L — ABNORMAL LOW (ref 22–32)
Calcium: 7.9 mg/dL — ABNORMAL LOW (ref 8.9–10.3)
Chloride: 117 mmol/L — ABNORMAL HIGH (ref 98–111)
Creatinine, Ser: 0.97 mg/dL (ref 0.44–1.00)
GFR, Estimated: >60 mL/min (ref >60)
Glucose, Bld: 75 mg/dL (ref 70–99)
Potassium: 4.6 mmol/L (ref 3.5–5.1)
Sodium: 144 mmol/L (ref 135–145)

## 2022-07-18 LAB — GLUCOSE, CAPILLARY
Glucose-Capillary: 76 mg/dL (ref 70–99)
Glucose-Capillary: 96 mg/dL (ref 70–99)
Glucose-Capillary: 116 mg/dL — ABNORMAL HIGH (ref 70–99)
Glucose-Capillary: 121 mg/dL — ABNORMAL HIGH (ref 70–99)

## 2022-07-18 LAB — PHOSPHORUS: Phosphorus: 3.8 mg/dL (ref 2.5–4.6)

## 2022-07-18 LAB — C DIFFICILE QUICK SCREEN W PCR REFLEX
C Diff antigen: NEGATIVE
C Diff interpretation: NOT DETECTED
C Diff toxin: NEGATIVE

## 2022-07-18 MED ORDER — LACTATED RINGERS IV SOLN: INTRAVENOUS | Status: DC

## 2022-07-18 MED ORDER — GERHARDT'S BUTT CREAM
TOPICAL_CREAM | Freq: Two times a day (BID) | CUTANEOUS | Status: DC
  Filled 2022-07-18 (×2): qty 1

## 2022-07-18 MED ORDER — HALOPERIDOL LACTATE 5 MG/ML IJ SOLN: 2.0000 mg | Freq: Four times a day (QID) | INTRAMUSCULAR | Status: DC | PRN

## 2022-07-18 NOTE — Care Management Important Message (Signed)
Important Message  Patient Details  Name: Stacy Grimes MRN: [id number] Date of Birth: 1950/07/05   Medicare Important Message Given:  N/A - LOS <3 / Initial given by admissions     [name] 07/18/2022, 3:25 PM

## 2022-07-18 NOTE — Progress Notes (Signed)
Progress Note   Patient: Stacy Grimes XBM:[id number] DOB: [birth year] DOA: 07/16/2022     2 DOS: the patient was seen and examined on 07/18/2022   Brief hospital course: Stacy Grimes is a 72 y.o. 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  Patient had minimal intake for 11 days.  Upon arriving the hospital, patient had a acute kidney injury, lactic acidosis, potassium 2.9.  Patient was also placed on Rocephin for urinary tract infection.  Was given IV fluids and replete potassium, magnesium.   Principal Problem:   Intractable nausea and vomiting Active Problems:   Seizure disorder (HCC)   Acute metabolic encephalopathy   Hypomagnesemia   AKI (acute kidney injury) (HCC)   Hypokalemia   Chronic systolic CHF (congestive heart failure) (HCC)   Chronic obstructive pulmonary disease, unspecified COPD type (HCC)   Chronic respiratory failure with hypoxia (HCC)   Dementia with behavioral disturbance (HCC)   Protein-calorie malnutrition, severe (HCC)   ICD (implantable cardioverter-defibrillator) in place   Lactic acidosis   Hypoglycemia associated with type 2 diabetes mellitus (HCC)   Failure to thrive in adult   Anorexia   History of stroke   Hypophosphatemia   Metabolic acidosis   Assessment and Plan: Intractable nausea vomiting. Chronic diarrhea. Hypokalemia secondary to nausea vomiting. Hypomagnesemia secondary nausea vomiting. Hypophosphatemia. Acute kidney injury. Patient had acute kidney injury at time of admission, received a fluid bolus, renal function has normalized.  Patient also received potassium,  potassium phosphate. Electrolytes has  normalized. Patient also started on pancreatic enzyme, diarrhea seem to be slowing down.   Lactic acidosis. Metabolic acidosis. Urinary tract infection. Sepsis is ruled out. Patient had a severe lactic acidosis, appears to be secondary to severe dehydration. UA positive,start rocephin.  Patient did not meet sepsis criteria at time of admission.  Patient also has normal anion gap metabolic acidosis independent of lactic acidosis.  IV fluids changed to lactated ringer.  Continue to follow. Blood culture negative, urine culture showed multi species.  Will continue completed 3 days of Rocephin.   Acute metabolic encephalopathy. Patient still very drowsy, hard to wake up.  This appears to be secondary to her medical problems.   Her condition much improved today, she no longer has any confusion.   Type 2 diabetes with hypoglycemia. Hypothyroidism. TSH and cortisol level within normal limits.  Low glucose due to poor p.o. intake.  Hypoglycemia is better, patient has an appetite now.   Failure to thrive. Severe protein calorie malnutrition. Anorexia. Patient started have appetite, continue protein supplement.   Chronic systolic congestive heart failure. Status post ICD. No evidence of exacerbation congestive heart failure.   COPD. Chronic hypoxemic respite failure secondary to COPD. Continues on bronchodilator, continue oxygen treatment as needed.   Seizure disorder Dementia with behavioral disturbance. History of stroke. Continue some home medicines.         Subjective:  Patient doing much better today, she no longer has any confusion.  Diarrhea is slowing down.  She started eating this morning.  Physical Exam: Vitals:   07/17/22 1754 07/17/22 2019 07/18/22 0406 07/18/22 0833  BP: 110/71 132/73 138/70 135/75  Pulse: 88 90 79 79  Resp: 16 18 18 19   Temp: 98.6 F (37 C) 98.2 F (36.8 C)  98.1 F (36.7 C) 98.2 F (36.8 C)  TempSrc: Oral Oral  Oral  SpO2: 97% 97%  97%   Weight:       General exam: Appears calm and comfortable.  Appears severely malnourished. Respiratory system: Clear to auscultation. Respiratory effort normal. Cardiovascular system: S1 & S2 heard, RRR. No JVD, murmurs, rubs, gallops or clicks. No pedal edema. Gastrointestinal system: Abdomen is nondistended, soft and nontender. No organomegaly or masses felt. Normal bowel sounds heard. Central nervous system: Alert and oriented x3. No focal neurological deficits. Extremities: Muscle atrophy. Skin: No rashes, lesions or ulcers Psychiatry: Judgement and insight appear normal. Mood & affect appropriate.    Data Reviewed:  Lab results reviewed.  Family Communication: husband updated  Disposition: Status is: Inpatient Remains inpatient appropriate because: Severity of disease, IV treatment, altered mental status.     Time spent: 35 minutes  Author: Marrion Coy, MD 07/18/2022 11:37 AM  For on call review [email or web address].

## 2022-07-18 NOTE — Evaluation (Signed)
Physical Therapy Evaluation Patient Details Name: Stacy Grimes MRN: [id number] DOB: [birth year] Today's Date: 07/18/2022  History of Present Illness  72 y.o. 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  Clinical Impression  Pt pleasant and willing to participate with PT. She showed some effort with getting to sitting EOB, but needed assist to/from sitting and had not only nausea with the effort but also had sustained elevated HR in the 150s.  Further PT intervention deferred.  Pt will benefit from continued PT to address functional limitations.       Recommendations for follow up therapy are one component of a multi-disciplinary discharge planning process, led by the attending physician.  Recommendations may be updated based on patient status, additional functional criteria and insurance authorization.  Follow Up Recommendations Can patient physically be transported by private vehicle: No     Assistance Recommended at Discharge Frequent or constant Supervision/Assistance  Patient can return home with the following  Two people to help with walking and/or transfers;Two people to help with bathing/dressing/bathroom;Assistance with cooking/housework;Direct supervision/assist for medications management;Help with stairs or ramp for entrance;Assist for transportation    Equipment Recommendations    Recommendations for Other Services       Functional Status Assessment Patient has had a recent decline in their functional status and demonstrates the ability to make significant improvements in function in a reasonable and predictable amount of time.     Precautions /  Restrictions Precautions Precautions: Fall Restrictions Weight Bearing Restrictions: No Other Position/Activity Restrictions: h/o femur fracture 6 mos ago with NWB at the time      Mobility  Bed Mobility Overal bed mobility: Needs Assistance Bed Mobility: Supine to Sit, Sit to Supine   Sidelying to sit: Mod assist Supine to sit: Max assist     General bed mobility comments: pt was able to initiate moving LEs toward EOB, ultimately needing direct assist with LEs and trunk to attain sitting, heavy assist with both to return to supine    Transfers                   General transfer comment: differed 2/2 pt sustaining  HR in the 150s and having a lot of nausea    Ambulation/Gait                  Stairs            Wheelchair Mobility    Modified Rankin (Stroke Patients Only)       Balance Overall balance assessment: Needs assistance Sitting-balance support: Feet supported Sitting balance-Leahy Scale: Fair Sitting balance - Comments: Pt was able to maintain sitting balance for ~3 minutes but having some nausea and tending to want to lean over onto L elbow, though she was able to maintain sitting upright to hold basin, pt did not heave or have any vomiting                                     Pertinent Vitals/Pain Pain Assessment Pain Assessment: Faces Faces Pain Scale: Hurts little more Pain Location: reports back pain but mostly c/o nausea/dizziness    Home Living Family/patient expects to be discharged to:: Unsure  Living Arrangements: Spouse/significant other Available Help at Discharge: Family;Available 24 hours/day Type of Home: Mobile home Home Access: Ramped entrance       Home Layout: One level Home Equipment: Rolling Walker (2 wheels);BSC/3in1;Rollator (4 wheels);Cane - single point Additional Comments: History from chart review, no family at bedside. Medical record states spouse and grandson helping pt at home.     Prior Function Prior Level of Function : Needs assist (pt unable to answer other than "He (husband) has to help a lot")                     Hand Dominance        Extremity/Trunk Assessment   Upper Extremity Assessment Upper Extremity Assessment: Generalized weakness    Lower Extremity Assessment Lower Extremity Assessment: Generalized weakness       Communication      Cognition Arousal/Alertness: Awake/alert Behavior During Therapy: WFL for tasks assessed/performed Overall Cognitive Status: History of cognitive impairments - at baseline                                 General Comments: Pt does not recall/relay much recent episodic history but        General Comments General comments (skin integrity, edema, etc.): Pt was relatively pleasant and willing to do some light in bed mobility tasks, session cut short due to sustained elevated heart rate with even light activity    Exercises     Assessment/Plan    PT Assessment Patient needs continued PT services  PT Problem List Decreased mobility;Decreased strength;Decreased activity tolerance;Decreased balance;Decreased knowledge of use of DME       PT Treatment Interventions DME instruction;Therapeutic activities;Therapeutic exercise;Gait training;Patient/family education;Stair training;Balance training;Neuromuscular re-education;Functional mobility training    PT Goals (Current goals can be found in the Care Plan section)  Acute Rehab PT Goals Patient Stated Goal: get feeling better PT Goal Formulation: With patient Time For Goal Achievement: 07/31/22 Potential to Achieve Goals: Fair    Frequency Min 2X/week     Co-evaluation               AM-PAC PT "6 Clicks" Mobility  Outcome Measure Help needed turning from your back to your side while in a flat bed without using bedrails?: A Lot Help needed moving from lying on your back to sitting on the side of a flat bed without using  bedrails?: A Lot Help needed moving to and from a bed to a chair (including a wheelchair)?: Total Help needed standing up from a chair using your arms (e.g., wheelchair or bedside chair)?: Total Help needed to walk in hospital room?: Total Help needed climbing 3-5 steps with a railing? : Total 6 Click Score: 8    End of Session   Activity Tolerance: Treatment limited secondary to medical complications (Comment) Patient left: in bed;with call bell/phone within reach;with bed alarm set Nurse Communication: Mobility status PT Visit Diagnosis: Other abnormalities of gait and mobility (R26.89);Muscle weakness (generalized) (M62.81);Difficulty in walking, not elsewhere classified (R26.2)    Time: 1610-9604 PT Time Calculation (min) (ACUTE ONLY): 16 min   Charges:   PT Evaluation $PT Eval Low Complexity: 1 Low          Malachi Pro, DPT 07/18/2022, 3:31 PM

## 2022-07-18 NOTE — Plan of Care (Signed)
  Problem: Clinical Measurements: Goal: Will remain free from infection Outcome: Progressing   Problem: Clinical Measurements: Goal: Respiratory complications will improve Outcome: Progressing   Problem: Clinical Measurements: Goal: Cardiovascular complication will be avoided Outcome: Progressing   Problem: Nutrition: Goal: Adequate nutrition will be maintained Outcome: Progressing   Problem: Pain Managment: Goal: General experience of comfort will improve Outcome: Progressing   Problem: Safety: Goal: Ability to remain free from injury will improve Outcome: Progressing   

## 2022-07-19 DIAGNOSIS — R112 Nausea with vomiting, unspecified: Secondary | ICD-10-CM | POA: Diagnosis not present

## 2022-07-19 DIAGNOSIS — E872 Acidosis, unspecified: Secondary | ICD-10-CM

## 2022-07-19 DIAGNOSIS — G9341 Metabolic encephalopathy: Secondary | ICD-10-CM | POA: Diagnosis not present

## 2022-07-19 LAB — BASIC METABOLIC PANEL
Anion gap: 8 (ref 5–15)
BUN: 5 mg/dL — ABNORMAL LOW (ref 8–23)
CO2: 16 mmol/L — ABNORMAL LOW (ref 22–32)
Calcium: 8.1 mg/dL — ABNORMAL LOW (ref 8.9–10.3)
Chloride: 116 mmol/L — ABNORMAL HIGH (ref 98–111)
Creatinine, Ser: 1 mg/dL (ref 0.44–1.00)
GFR, Estimated: 59 mL/min — ABNORMAL LOW (ref >60)
Glucose, Bld: 88 mg/dL (ref 70–99)
Potassium: 4 mmol/L (ref 3.5–5.1)
Sodium: 140 mmol/L (ref 135–145)

## 2022-07-19 LAB — GLUCOSE, CAPILLARY
Glucose-Capillary: 91 mg/dL (ref 70–99)
Glucose-Capillary: 106 mg/dL — ABNORMAL HIGH (ref 70–99)
Glucose-Capillary: 98 mg/dL (ref 70–99)
Glucose-Capillary: 85 mg/dL (ref 70–99)

## 2022-07-19 LAB — CULTURE, BLOOD (ROUTINE X 2)

## 2022-07-19 LAB — PHOSPHORUS: Phosphorus: 3 mg/dL (ref 2.5–4.6)

## 2022-07-19 LAB — CBC
HCT: 33.2 % — ABNORMAL LOW (ref 36.0–46.0)
Hemoglobin: 10.3 g/dL — ABNORMAL LOW (ref 12.0–15.0)
MCH: 27.5 pg (ref 26.0–34.0)
MCHC: 31 g/dL (ref 30.0–36.0)
MCV: 88.5 fL (ref 80.0–100.0)
Platelets: 252 10*3/uL (ref 150–400)
RBC: 3.75 MIL/uL — ABNORMAL LOW (ref 3.87–5.11)
RDW: 20.8 % — ABNORMAL HIGH (ref 11.5–15.5)
WBC: 7 10*3/uL (ref 4.0–10.5)
nRBC: 0 % (ref 0.0–0.2)

## 2022-07-19 LAB — MAGNESIUM: Magnesium: 1.8 mg/dL (ref 1.7–2.4)

## 2022-07-19 MED ORDER — ACETAMINOPHEN 500 MG PO TABS
1000.0000 mg | ORAL_TABLET | Freq: Four times a day (QID) | ORAL | Status: DC | PRN
  Administered 2022-07-19 – 2022-07-20 (×4): 1000 mg via ORAL
  Filled 2022-07-19 (×4): qty 2

## 2022-07-19 MED ORDER — LOPERAMIDE HCL 2 MG PO CAPS
2.0000 mg | ORAL_CAPSULE | ORAL | Status: DC | PRN
  Administered 2022-07-19 – 2022-07-23 (×14): 2 mg via ORAL
  Filled 2022-07-19 (×15): qty 1

## 2022-07-19 MED ORDER — LOPERAMIDE HCL 2 MG PO CAPS
2.0000 mg | ORAL_CAPSULE | Freq: Two times a day (BID) | ORAL | Status: DC
  Administered 2022-07-19: 2 mg via ORAL
  Filled 2022-07-19: qty 1

## 2022-07-19 MED ORDER — RISAQUAD PO CAPS
2.0000 | ORAL_CAPSULE | Freq: Three times a day (TID) | ORAL | Status: DC
  Administered 2022-07-19 – 2022-07-23 (×13): 2 via ORAL
  Filled 2022-07-19 (×13): qty 2

## 2022-07-19 MED ORDER — ONDANSETRON HCL 4 MG/2ML IJ SOLN
4.0000 mg | Freq: Once | INTRAMUSCULAR | Status: AC
  Administered 2022-07-19: 4 mg via INTRAVENOUS
  Filled 2022-07-19: qty 2

## 2022-07-19 MED ORDER — STERILE WATER FOR INJECTION IV SOLN
INTRAVENOUS | Status: DC
  Filled 2022-07-19 (×3): qty 1000
  Filled 2022-07-19: qty 150

## 2022-07-19 NOTE — TOC Initial Note (Signed)
Transition of Care [facility]) - Initial/Assessment Note    Patient Details  Name: Stacy Grimes MRN: [id number] Date of Birth: Jan 07, 1951  [facility]) CM/SW Contact:    Chapman Fitch, RN Phone Number: 07/19/2022, 4:24 PM  Clinical Narrative:                  Patient with extreme risk assessment Patient admitted for nausea and vomiting  Patient was assessed by TOC on 5/24 see note from that admission below Admitted from: Home. Patient lives with her husband and grandson  Per husband she is active with [facility]  PCP: Dr. [name]  [facility]  Current home health/prior home health/DME:rollator, bp cuff, cane, walker.  Per spouse patient will need EMS transport home. "     Per Adelina Mings with [facility] active with SN/PT/OT/aide  Will need EMS transport at discharge   Patient Goals and CMS Choice            Expected Discharge Plan and Services                                              Prior Living Arrangements/Services                       Activities of Daily Living      Permission Sought/Granted                  Emotional Assessment              Admission diagnosis:  Dehydration [E86.0] Lactic acidosis [E87.20] Intractable nausea and vomiting [R11.2] Patient Active Problem List   Diagnosis Date Noted   Metabolic acidosis 07/18/2022   Hypophosphatemia 07/17/2022   Intractable nausea and vomiting 07/16/2022   Hypoglycemia associated with type 2 diabetes mellitus (HCC) 07/16/2022   Failure to thrive in adult 07/16/2022   Anorexia 07/16/2022   History of stroke 07/16/2022   Normocytic anemia 04/13/2022   Thrombocytopenia (HCC) 04/13/2022   Lactic acidosis 04/13/2022   Malnutrition of moderate degree 04/12/2022   Altered mental status 04/11/2022   Hyperkalemia 04/11/2022   Nausea 03/10/2022   NSVT (nonsustained ventricular tachycardia) (HCC) 03/08/2022   Seizure disorder (HCC) 03/07/2022    Hypomagnesemia 03/07/2022   Hypocalcemia 03/07/2022   Chronic respiratory failure with hypoxia (HCC) 03/07/2022   Acute metabolic encephalopathy 03/07/2022   Hypothyroidism 03/07/2022   AAA (abdominal aortic aneurysm) (HCC) 03/07/2022   Depression 03/07/2022   Diarrhea 03/07/2022   ICD (implantable cardioverter-defibrillator) in place 03/07/2022   Protein-calorie malnutrition, severe (HCC) 01/11/2022   Seizures (HCC) 01/08/2022   Acute on chronic respiratory failure with hypoxia and hypercapnia (HCC) 01/08/2022   Hypokalemia 01/08/2022   Wide-complex tachycardia 01/08/2022   Status epilepticus (HCC) 01/08/2022   Femur fracture (HCC) 12/10/2021   Fall 12/10/2021   Dementia with behavioral disturbance (HCC) 12/10/2021   COVID-19 virus infection 12/10/2021   AKI (acute kidney injury) (HCC) 12/10/2021   Shock circulatory (HCC)    Hip fracture, unspecified laterality, closed, initial encounter (HCC) 10/23/2021   Hip fracture (HCC) 10/23/2021   CHF (congestive heart failure) (HCC) 10/23/2021   Orthostatic hypotension 08/06/2021   Chronic congestive heart failure (HCC)    Acute ischemic stroke (HCC) 06/21/2020   Chronic obstructive pulmonary disease, unspecified COPD type (HCC) 06/20/2020   Type 2 diabetes mellitus with hyperglycemia,  unspecified whether long term insulin use (HCC) 06/20/2020   Left knee pain 12/16/2019   Elevated troponin I level 12/15/2019   Dehydration 04/12/2018   Syncope 04/09/2018   Benign neoplasm of colon 08/31/2012   Diverticulosis of colon (without mention of hemorrhage) 08/31/2012   Generalized weakness 08/10/2012   Personal history of colonic polyps 07/02/2012   Dysphagia, unspecified(787.20) 07/02/2012   Unspecified constipation 07/02/2012   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 07/09/2010   Hypertension 03/24/2009   SLEEP APNEA, OBSTRUCTIVE 03/23/2009   Dilated cardiomyopathy (HCC) 03/23/2009   Chronic systolic CHF  (congestive heart failure) (HCC) 03/23/2009   PALPITATIONS 03/23/2009   MURMUR 03/23/2009   COUGH 03/23/2009   PCP:  [name], MD Pharmacy:   [facility] [address] [place], [place] - [address] [place] [address] Phone: [phone] Fax: [phone]  [facility] [address] [place] [address] Phone: [phone] Fax: [phone]  [facility] [address] [place] [address] Phone: [phone] Fax: [phone]     Social Determinants of Health (SDOH) Social History: SDOH Screenings   Food Insecurity: No Food Insecurity (06/10/2022)  Housing: Patient Unable To Answer (06/10/2022)  Transportation Needs: No Transportation Needs (06/10/2022)  Utilities: Not At Risk (03/07/2022)  Depression (PHQ2-9): Low Risk  (09/01/2019)  Tobacco Use: Medium Risk (06/26/2022)   SDOH Interventions:     Readmission Risk Interventions    07/19/2022    4:23 PM 06/28/2022    2:46 PM 04/12/2022    1:27 PM  Readmission Risk Prevention Plan  Transportation Screening Complete Complete Complete  Medication Review Oceanographer) Complete Complete Complete  PCP or Specialist appointment within 3-5 days of discharge  Complete Complete  HRI or Home Care Consult   Complete  SW Recovery Care/Counseling Consult Complete Complete Complete  Palliative Care Screening Complete Not Applicable Not Complete  Skilled Nursing Facility Not Applicable Not Applicable Not Complete

## 2022-07-19 NOTE — Progress Notes (Signed)
Progress Note   Patient: Stacy Grimes:[id number] DOB: [birth year] DOA: 07/16/2022     3 DOS: the patient was seen and examined on 07/19/2022   Brief hospital course: Stacy Grimes is a 72 y.o. 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  Patient had minimal intake for 11 days.  Upon arriving the hospital, patient had a acute kidney injury, lactic acidosis, potassium 2.9.  Patient was also placed on Rocephin for urinary tract infection.  Was given IV fluids and replete potassium, magnesium.   Principal Problem:   Intractable nausea and vomiting Active Problems:   Seizure disorder (HCC)   Acute metabolic encephalopathy   Hypomagnesemia   AKI (acute kidney injury) (HCC)   Hypokalemia   Chronic systolic CHF (congestive heart failure) (HCC)   Chronic obstructive pulmonary disease, unspecified COPD type (HCC)   Chronic respiratory failure with hypoxia (HCC)   Dementia with behavioral disturbance (HCC)   Protein-calorie malnutrition, severe (HCC)   ICD (implantable cardioverter-defibrillator) in place   Lactic acidosis   Hypoglycemia associated with type 2 diabetes mellitus (HCC)   Failure to thrive in adult   Anorexia   History of stroke   Hypophosphatemia   Metabolic acidosis   Assessment and Plan: Intractable nausea vomiting. Chronic diarrhea. Hypokalemia secondary to nausea vomiting. Hypomagnesemia secondary nausea vomiting. Hypophosphatemia. Acute kidney injury. Patient had acute kidney injury at time of admission, received a fluid bolus, renal function has normalized.  Patient also received potassium,  potassium phosphate. Electrolytes has  normalized. Continue pancreatic enzymes.  Patient diarrhea seem to be slowed down.  Appetite is still poor.  Will continue monitor the patient, if diarrhea does not getting better, may consider start rifaximin for possible small bacterial overgrowth.   Lactic acidosis. Metabolic acidosis. Urinary tract infection. Sepsis is ruled out. Patient had a severe lactic acidosis, appears to be secondary to severe dehydration. UA positive,start rocephin.  Patient did not meet sepsis criteria at time of admission.  Patient also has normal anion gap metabolic acidosis independent of lactic acidosis.   Blood culture negative, urine culture showed multi species.   Patient so far has completed 3 days IV Rocephin.  Patient has worsening normal anion gap metabolic acidosis, will start sodium bicarb drip.  This is from diarrhea.   Acute metabolic encephalopathy. Patient still very drowsy, hard to wake up.  This appears to be secondary to her medical problems.   Mental status is better.   Type 2 diabetes with hypoglycemia. Hypothyroidism. TSH and cortisol level within normal limits.  Low glucose due to poor p.o. intake.  Hypoglycemia is better, patient has an appetite now.   Failure to thrive. Severe protein calorie malnutrition. Anorexia. Patient started have appetite, continue protein supplement.   Chronic systolic congestive heart failure. Status post ICD. No evidence of exacerbation congestive heart failure.   COPD. Chronic hypoxemic respite failure secondary to COPD. Continues on bronchodilator, continue oxygen treatment as needed.   Seizure disorder Dementia with behavioral disturbance. History of stroke. Continue some home medicines.            Subjective:  Patient still has some loose stools, but very small.  Her appetite is still poor, but able to drink Ensure and eat some food.  Physical Exam: Vitals:  07/19/22 0109 07/19/22 0221 07/19/22 0621 07/19/22 1013  BP: 122/89  132/82 104/71 112/68  Pulse:    (!) 106  Resp:  18 18 16   Temp:  97.7 F (36.5 C) 99 F (37.2 C) 98.4 F (36.9 C)  TempSrc:  Oral Oral Oral  SpO2:  97% 95% 99%  Weight:       General exam: Appears calm and comfortable, severely malnourished. Respiratory system: Clear to auscultation. Respiratory effort normal. Cardiovascular system: S1 & S2 heard, RRR. No JVD, murmurs, rubs, gallops or clicks. No pedal edema. Gastrointestinal system: Abdomen is nondistended, soft and nontender. No organomegaly or masses felt. Normal bowel sounds heard. Central nervous system: Alert and oriented x2. No focal neurological deficits. Extremities: Symmetric 5 x 5 power. Skin: No rashes, lesions or ulcers Psychiatry: Flat affect    Data Reviewed:  Lab results reviewed.  Family Communication: None  Disposition: Status is: Inpatient Remains inpatient appropriate because: Severity of disease, IV treatment.     Time spent: 35 minutes  Author: Marrion Coy, MD 07/19/2022 2:08 PM  For on call review [email or web address].

## 2022-07-19 NOTE — Progress Notes (Signed)
MEWS Progress Note  Patient Details Name: Stacy Grimes MRN: [id number] DOB: 05-Jul-1950 Today's Date: 07/19/2022   MEWS Flowsheet Documentation:  Assess: MEWS Score Temp: 97.7 F (36.5 C) BP: 132/82 MAP (mmHg): 98 Pulse Rate: (!) 102 ECG Heart Rate: (!) 127 Resp: 18 Level of Consciousness: Alert SpO2: 97 % O2 Device: Room Air O2 Flow Rate (L/min): 3 L/min Assess: MEWS Score MEWS Temp: 0 MEWS Systolic: 0 MEWS Pulse: 2 MEWS RR: 0 MEWS LOC: 0 MEWS Score: 2 MEWS Score Color: Yellow Assess: SIRS CRITERIA SIRS Temperature : 0 SIRS Respirations : 0 SIRS Pulse: 1 SIRS WBC: 0 SIRS Score Sum : 1 SIRS Temperature : 0 SIRS Pulse: 1 SIRS Respirations : 0 SIRS WBC: 0 SIRS Score Sum : 1 Assess: if the MEWS score is Yellow or Red Were vital signs taken at a resting state?: Yes Focused Assessment: Change from prior assessment (see assessment flowsheet) Does the patient meet 2 or more of the SIRS criteria?: No MEWS guidelines implemented : Yes, yellow Treat MEWS Interventions: Considered administering scheduled or prn medications/treatments as ordered Take Vital Signs Increase Vital Sign Frequency : Yellow: Q2hr x1, continue Q4hrs until patient remains green for 12hrs Escalate MEWS: Escalate: Yellow: Discuss with charge nurse and consider notifying provider and/or RRT Notify: Charge Nurse/RN Name of Charge Nurse/RN Notified: Jodie, RN Provider Notification Provider Name/Title: Manuela Schwartz NP Date Provider Notified: 07/19/22 Time Provider Notified: [id number] Method of Notification: Page Notification Reason: Other (Comment) (Yellow mews) Provider response: See new orders Date of Provider Response: 07/19/22 Time of Provider Response: 0026      Rushil Kimbrell M Kynadee Dam 07/19/2022, 2:32 AM

## 2022-07-19 NOTE — Plan of Care (Signed)
  Problem: Clinical Measurements: Goal: Ability to maintain clinical measurements within normal limits will improve Outcome: Progressing   Problem: Clinical Measurements: Goal: Will remain free from infection Outcome: Progressing   Problem: Clinical Measurements: Goal: Respiratory complications will improve Outcome: Progressing   Problem: Clinical Measurements: Goal: Cardiovascular complication will be avoided Outcome: Progressing   Problem: Nutrition: Goal: Adequate nutrition will be maintained Outcome: Progressing   Problem: Elimination: Goal: Will not experience complications related to bowel motility Outcome: Progressing   Problem: Elimination: Goal: Will not experience complications related to urinary retention Outcome: Progressing

## 2022-07-19 NOTE — Progress Notes (Addendum)
CCMD called and reported HR sustaining at 128-140. VSS. On assement pt stable but compliants of back pain and nausea. NP Jon Billings made aware. Will continue to monitor  Update 0026: NP Jon Billings place order. Will continue to monitor.Marland Kitchen  Update 0037: Zofran administered. At 0102 HR still going from 115-128.non sustain. NP Morrson made aware. Will continue to monitor.  Update 0200: Pt is complaining 5/10 pain of her botom. On assesment  it was red. Scheduled cream was adminsitered. NP Jon Billings made aware and place order. Will continue to monitor.  Update 0204: NP Jon Billings placed additional order. Will continue to monitor.  Update 0246: Pt HR is between 115-118. NP Jon Billings made aware. Will continue to monitor.  Update 0300: No new order place. Will continue to monitor.

## 2022-07-19 NOTE — Progress Notes (Signed)
Physical Therapy Treatment Patient Details Name: Stacy Grimes MRN: [id number] DOB: [birth year] Today's Date: 07/19/2022   History of Present Illness 72 y.o. 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    PT Comments    PT called pt's husband to obtain PLOF.  Pt's husband reports pt has been bed-bound since December (got up to w/c 1x since then with assist); per imaging of R knee 12/10/21 pt with "Acute transverse fracture of the distal femoral metaphysis skirting the inferior margin of the IM nail. This fracture is mostly nondisplaced. This fracture likely extends into the anterior intercondylar trochlear groove"--pt's husband reports pt does not wear any bracing for this (also did not have surgery for this) but was supposed to follow up with ortho in December but unable d/t pt having seizure and getting admitted to hospital; pt with multiple hospitalizations since; pt wears adult diapers and pt's husband assists her with toileting/clean-up in bed; pt receiving HHPT but pt very limited d/t LE weakness and recently not eating.  During session pt min assist with logrolling in bed; limited session d/t pt declining to get OOB (or sit on edge of bed).  HR 103 bpm at rest to 115 bpm with activity.  Pt noted to be incontinent of BM and urine--NT present end of session to assist pt with peri-care.  PT recommendations updated to align with pt's PLOF--TOC updated.   Recommendations for follow up therapy are one component of a multi-disciplinary discharge planning process, led by the attending physician.  Recommendations may be updated based on patient status, additional functional criteria  and insurance authorization.  Follow Up Recommendations  Can patient physically be transported by private vehicle: No    Assistance Recommended at Discharge Frequent or constant Supervision/Assistance  Patient can return home with the following Two people to help with walking and/or transfers;Two people to help with bathing/dressing/bathroom;Assistance with cooking/housework;Direct supervision/assist for medications management;Help with stairs or ramp for entrance;Assist for transportation   Equipment Recommendations  None recommended by PT    Recommendations for Other Services       Precautions / Restrictions Precautions Precautions: Fall Restrictions Weight Bearing Restrictions: No Other Position/Activity Restrictions: H/o distal femur fracture 6 months ago with NWB at the time     Mobility  Bed Mobility Overal bed mobility: Needs Assistance Bed Mobility: Rolling Rolling: Min assist (logrolling R side to back to L side to back)         General bed mobility comments: mild increased effort to perform; pt declined to sit on edge of bed    Transfers                        Ambulation/Gait                   Stairs             Wheelchair Mobility    Modified Rankin (Stroke Patients Only)       Balance                                            Cognition Arousal/Alertness: Awake/alert Behavior  During Therapy: WFL for tasks assessed/performed Overall Cognitive Status: No family/caregiver present to determine baseline cognitive functioning                                 General Comments: Oriented to person        Exercises      General Comments  Nursing cleared pt for participation in physical therapy.  Pt agreeable to limited PT session.      Pertinent Vitals/Pain Pain Assessment Pain Assessment: Faces Faces Pain Scale: No hurt Pain Intervention(s): Limited activity within patient's tolerance,  Monitored during session, Repositioned    Home Living                          Prior Function            PT Goals (current goals can now be found in the care plan section) Acute Rehab PT Goals Patient Stated Goal: get feeling better PT Goal Formulation: With patient Time For Goal Achievement: 07/31/22 Potential to Achieve Goals: Fair Progress towards PT goals: Progressing toward goals    Frequency    Min 2X/week      PT Plan Discharge plan needs to be updated    Co-evaluation              AM-PAC PT "6 Clicks" Mobility   Outcome Measure  Help needed turning from your back to your side while in a flat bed without using bedrails?: A Little Help needed moving from lying on your back to sitting on the side of a flat bed without using bedrails?: A Lot Help needed moving to and from a bed to a chair (including a wheelchair)?: Total Help needed standing up from a chair using your arms (e.g., wheelchair or bedside chair)?: Total Help needed to walk in hospital room?: Total Help needed climbing 3-5 steps with a railing? : Total 6 Click Score: 9    End of Session   Activity Tolerance: Patient limited by fatigue Patient left: in bed;with call bell/phone within reach;with bed alarm set;with nursing/sitter in room;Other (comment) (NT present assisting pt with peri-care) Nurse Communication: Mobility status;Precautions PT Visit Diagnosis: Other abnormalities of gait and mobility (R26.89);Muscle weakness (generalized) (M62.81);Difficulty in walking, not elsewhere classified (R26.2)     Time: 1610-9604 PT Time Calculation (min) (ACUTE ONLY): 13 min  Charges:  $Therapeutic Activity: 8-22 mins                     Hendricks Limes, PT 07/19/22, 4:25 PM

## 2022-07-19 NOTE — Care Management Important Message (Signed)
Important Message  Patient Details  Name: Stacy Grimes MRN: [id number] Date of Birth: Jul 24, 1950   Medicare Important Message Given:  Yes  I reviewed the Important Message from Medicare with the patient's spouse, Maclovia Lu and he stated he understood those rights. I thanked him for his time.    Cosandra A Javari Bufkin 07/19/2022, 1:02 PM

## 2022-07-20 DIAGNOSIS — R630 Anorexia: Secondary | ICD-10-CM | POA: Diagnosis not present

## 2022-07-20 DIAGNOSIS — R112 Nausea with vomiting, unspecified: Secondary | ICD-10-CM | POA: Diagnosis not present

## 2022-07-20 DIAGNOSIS — N179 Acute kidney failure, unspecified: Secondary | ICD-10-CM

## 2022-07-20 DIAGNOSIS — G9341 Metabolic encephalopathy: Secondary | ICD-10-CM | POA: Diagnosis not present

## 2022-07-20 LAB — GLUCOSE, CAPILLARY
Glucose-Capillary: 70 mg/dL (ref 70–99)
Glucose-Capillary: 81 mg/dL (ref 70–99)
Glucose-Capillary: 101 mg/dL — ABNORMAL HIGH (ref 70–99)
Glucose-Capillary: 92 mg/dL (ref 70–99)

## 2022-07-20 LAB — CBC
HCT: 29.3 % — ABNORMAL LOW (ref 36.0–46.0)
Hemoglobin: 9.2 g/dL — ABNORMAL LOW (ref 12.0–15.0)
MCH: 27.4 pg (ref 26.0–34.0)
MCHC: 31.4 g/dL (ref 30.0–36.0)
MCV: 87.2 fL (ref 80.0–100.0)
Platelets: 209 10*3/uL (ref 150–400)
RBC: 3.36 MIL/uL — ABNORMAL LOW (ref 3.87–5.11)
RDW: 19.9 % — ABNORMAL HIGH (ref 11.5–15.5)
WBC: 6.1 10*3/uL (ref 4.0–10.5)
nRBC: 0 % (ref 0.0–0.2)

## 2022-07-20 LAB — BASIC METABOLIC PANEL
Anion gap: 6 (ref 5–15)
BUN: 8 mg/dL (ref 8–23)
CO2: 26 mmol/L (ref 22–32)
Calcium: 7.6 mg/dL — ABNORMAL LOW (ref 8.9–10.3)
Chloride: 108 mmol/L (ref 98–111)
Creatinine, Ser: 0.88 mg/dL (ref 0.44–1.00)
GFR, Estimated: >60 mL/min (ref >60)
Glucose, Bld: 69 mg/dL — ABNORMAL LOW (ref 70–99)
Potassium: 3.5 mmol/L (ref 3.5–5.1)
Sodium: 140 mmol/L (ref 135–145)

## 2022-07-20 LAB — CULTURE, BLOOD (ROUTINE X 2): Special Requests: ADEQUATE

## 2022-07-20 LAB — MAGNESIUM: Magnesium: 1.8 mg/dL (ref 1.7–2.4)

## 2022-07-20 MED ORDER — MEGESTROL ACETATE 400 MG/10ML PO SUSP
400.0000 mg | Freq: Every day | ORAL | Status: DC
  Administered 2022-07-20 – 2022-07-23 (×4): 400 mg via ORAL
  Filled 2022-07-20 (×4): qty 10

## 2022-07-20 MED ORDER — QUETIAPINE FUMARATE 25 MG PO TABS
25.0000 mg | ORAL_TABLET | Freq: Every day | ORAL | Status: DC
  Administered 2022-07-20: 25 mg via ORAL
  Filled 2022-07-20: qty 1

## 2022-07-20 MED ORDER — POTASSIUM CHLORIDE CRYS ER 20 MEQ PO TBCR
40.0000 meq | EXTENDED_RELEASE_TABLET | Freq: Once | ORAL | Status: AC
  Administered 2022-07-20: 40 meq via ORAL
  Filled 2022-07-20: qty 2

## 2022-07-20 NOTE — Progress Notes (Signed)
Progress Note   Patient: Stacy Grimes MVH:[id number] DOB: October 27, 1950 DOA: 07/16/2022     4 DOS: the patient was seen and examined on 07/20/2022   Brief hospital course: Stacy Grimes is a 72 y.o. 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  Patient had minimal intake for 11 days.  Upon arriving the hospital, patient had a acute kidney injury, lactic acidosis, potassium 2.9.  Patient was also placed on Rocephin for urinary tract infection.  Was given IV fluids and replete potassium, magnesium.   Principal Problem:   Intractable nausea and vomiting Active Problems:   Seizure disorder (HCC)   Acute metabolic encephalopathy   Hypomagnesemia   AKI (acute kidney injury) (HCC)   Hypokalemia   Chronic systolic CHF (congestive heart failure) (HCC)   Chronic obstructive pulmonary disease, unspecified COPD type (HCC)   Chronic respiratory failure with hypoxia (HCC)   Dementia with behavioral disturbance (HCC)   Protein-calorie malnutrition, severe (HCC)   ICD (implantable cardioverter-defibrillator) in place   Lactic acidosis   Hypoglycemia associated with type 2 diabetes mellitus (HCC)   Failure to thrive in adult   Anorexia   History of stroke   Hypophosphatemia   Metabolic acidosis   Assessment and Plan:  Intractable nausea vomiting. Chronic diarrhea. Hypokalemia secondary to nausea vomiting. Hypomagnesemia secondary nausea vomiting. Hypophosphatemia. Acute kidney injury. Patient had acute kidney injury at time of admission, received a fluid bolus, renal function has normalized.  Patient also received potassium,  potassium phosphate. Electrolytes has  normalized. Patient no longer has any nausea vomiting or diarrhea.  Appetite is still poor.  Will add megestrol.  Continue pancreatic enzymes. Renal function has normalized, potassium is 3.5, give another dose of KCl orally.   Lactic acidosis. Metabolic acidosis. Urinary tract infection. Sepsis is ruled out. Patient had a severe lactic acidosis, appears to be secondary to severe dehydration. UA positive,start rocephin.  Patient did not meet sepsis criteria at time of admission.  Patient also has normal anion gap metabolic acidosis independent of lactic acidosis.   Blood culture negative, urine culture showed multi species.   Patient so far has completed 3 days IV Rocephin.   Patient was given sodium bicarb drip, CO2 level has normalized.  Will discontinue sodium bicarb.   Acute metabolic encephalopathy. Patient still very drowsy, hard to wake up.  This appears to be secondary to her medical problems.   Patient still sleepy, no significant confusion.  She states that she did not sleep well last night, will add Seroquel every evening.   Type 2 diabetes with hypoglycemia. Hypothyroidism. TSH and cortisol level within normal limits.  Low glucose due to poor p.o. intake.  Hypoglycemia is better, patient has an appetite now.   Failure to thrive. Severe protein calorie malnutrition. Anorexia. Added megestrol for poor appetite.   Chronic systolic congestive heart failure. Status post ICD. No evidence of exacerbation congestive heart failure.   COPD. Chronic hypoxemic respite failure secondary to COPD. Continues on bronchodilator, continue oxygen treatment as needed.   Seizure disorder Dementia with behavioral disturbance. History of stroke. Continue some home medicines.   Anemia of chronic disease. B12 and iron adequate.  Continue to follow.    Subjective:  Patient still sleepy with a poor appetite.  But diarrhea has  slowed down.  Physical Exam: Vitals:   07/20/22 0429  07/20/22 0522 07/20/22 0801 07/20/22 0850  BP: (!) 87/52 108/63 (!) 91/44 (!) 115/53  Pulse: 93 93 81 91  Resp: 16  14   Temp: 98 F (36.7 C)  98.8 F (37.1 C)   TempSrc: Oral     SpO2: 93% 92% 94% 97%  Weight:       General exam: Appears calm and comfortable, appears malnourished. Respiratory system: Clear to auscultation. Respiratory effort normal. Cardiovascular system: S1 & S2 heard, RRR. No JVD, murmurs, rubs, gallops or clicks. No pedal edema. Gastrointestinal system: Abdomen is nondistended, soft and nontender. No organomegaly or masses felt. Normal bowel sounds heard. Central nervous system: Alert and oriented x2. No focal neurological deficits. Extremities: Symmetric 5 x 5 power. Skin: No rashes, lesions or ulcers Psychiatry: Mood & affect appropriate.    Data Reviewed:  Lab results reviewed.  Family Communication: None  Disposition: Status is: Inpatient Remains inpatient appropriate because: Severity of disease,     Time spent: 35 minutes  Author: Marrion Coy, MD 07/20/2022 11:45 AM  For on call review [email or web address].

## 2022-07-21 DIAGNOSIS — G9341 Metabolic encephalopathy: Secondary | ICD-10-CM | POA: Diagnosis not present

## 2022-07-21 DIAGNOSIS — E43 Unspecified severe protein-calorie malnutrition: Secondary | ICD-10-CM | POA: Diagnosis not present

## 2022-07-21 DIAGNOSIS — R112 Nausea with vomiting, unspecified: Secondary | ICD-10-CM | POA: Diagnosis not present

## 2022-07-21 DIAGNOSIS — F322 Major depressive disorder, single episode, severe without psychotic features: Secondary | ICD-10-CM | POA: Diagnosis not present

## 2022-07-21 LAB — BASIC METABOLIC PANEL
Anion gap: 7 (ref 5–15)
BUN: 10 mg/dL (ref 8–23)
CO2: 29 mmol/L (ref 22–32)
Calcium: 7.6 mg/dL — ABNORMAL LOW (ref 8.9–10.3)
Chloride: 104 mmol/L (ref 98–111)
Creatinine, Ser: 0.8 mg/dL (ref 0.44–1.00)
GFR, Estimated: >60 mL/min (ref >60)
Glucose, Bld: 100 mg/dL — ABNORMAL HIGH (ref 70–99)
Potassium: 3.2 mmol/L — ABNORMAL LOW (ref 3.5–5.1)
Sodium: 140 mmol/L (ref 135–145)

## 2022-07-21 LAB — GLUCOSE, CAPILLARY
Glucose-Capillary: 75 mg/dL (ref 70–99)
Glucose-Capillary: 90 mg/dL (ref 70–99)
Glucose-Capillary: 91 mg/dL (ref 70–99)
Glucose-Capillary: 101 mg/dL — ABNORMAL HIGH (ref 70–99)

## 2022-07-21 LAB — CULTURE, BLOOD (ROUTINE X 2): Culture: NO GROWTH

## 2022-07-21 LAB — MAGNESIUM: Magnesium: 1.7 mg/dL (ref 1.7–2.4)

## 2022-07-21 LAB — PHOSPHORUS: Phosphorus: 2.4 mg/dL — ABNORMAL LOW (ref 2.5–4.6)

## 2022-07-21 MED ORDER — POTASSIUM CHLORIDE CRYS ER 20 MEQ PO TBCR
40.0000 meq | EXTENDED_RELEASE_TABLET | ORAL | Status: AC
  Administered 2022-07-21 (×2): 40 meq via ORAL
  Filled 2022-07-21 (×2): qty 2

## 2022-07-21 MED ORDER — MIRTAZAPINE 15 MG PO TABS
30.0000 mg | ORAL_TABLET | Freq: Every day | ORAL | Status: DC
  Administered 2022-07-21 – 2022-07-22 (×2): 30 mg via ORAL
  Filled 2022-07-21 (×2): qty 2

## 2022-07-21 MED ORDER — QUETIAPINE FUMARATE 25 MG PO TABS
12.5000 mg | ORAL_TABLET | Freq: Every day | ORAL | Status: DC
  Administered 2022-07-21 – 2022-07-22 (×2): 12.5 mg via ORAL
  Filled 2022-07-21 (×2): qty 1

## 2022-07-21 MED ORDER — POTASSIUM PHOSPHATES 45 MMOLE/15ML IV SOLN
15.0000 mmol | Freq: Once | INTRAVENOUS | Status: AC
  Administered 2022-07-21: 15 mmol via INTRAVENOUS
  Filled 2022-07-21: qty 5

## 2022-07-21 NOTE — Progress Notes (Signed)
Progress Note   Patient: Stacy Grimes ZOX:[id number] DOB: [birth year] DOA: 07/16/2022     5 DOS: the patient was seen and examined on 07/21/2022   Brief hospital course: Stacy Grimes is a 72 y.o. 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  Patient had minimal intake for 11 days.  Upon arriving the hospital, patient had a acute kidney injury, lactic acidosis, potassium 2.9.  Patient was also placed on Rocephin for urinary tract infection.  Was given IV fluids and replete potassium, magnesium.   Principal Problem:   Intractable nausea and vomiting Active Problems:   Seizure disorder (HCC)   Acute metabolic encephalopathy   Hypomagnesemia   AKI (acute kidney injury) (HCC)   Hypokalemia   Chronic systolic CHF (congestive heart failure) (HCC)   Chronic obstructive pulmonary disease, unspecified COPD type (HCC)   Chronic respiratory failure with hypoxia (HCC)   Dementia with behavioral disturbance (HCC)   Protein-calorie malnutrition, severe (HCC)   ICD (implantable cardioverter-defibrillator) in place   Lactic acidosis   Hypoglycemia associated with type 2 diabetes mellitus (HCC)   Failure to thrive in adult   Anorexia   History of stroke   Hypophosphatemia   Metabolic acidosis   Assessment and Plan: Intractable nausea vomiting. Chronic diarrhea. Hypokalemia secondary to nausea vomiting. Hypomagnesemia secondary nausea vomiting. Hypophosphatemia. Acute kidney injury. Patient had acute kidney injury at time of admission, received a fluid bolus, renal function has normalized.  Patient also received potassium,  potassium phosphate. Stool study was negative  for C. difficile and enteric pathogen.  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 However, still has very poor appetite, she has been on megestrol.  She started drinking Ensure, but p.o. still inadequate.  Discussed with the patient about NG tube, she refused it. Will continue current treatment, replete potassium and phosphate. Patient will need outpatient EGD and colonoscopy.  Severe depression. Acute metabolic encephalopathy. Patient still very drowsy, hard to wake up.  This appears to be secondary to her medical problems.   She is more awake today, discussed with nurse, will sit her up in the chair. Patient also admits to severe depression, she is on Remeron 15 mg daily, increased dose to 30 mg daily.  May need increased dose at discharge.    Lactic acidosis. Metabolic acidosis. Urinary tract infection. Sepsis is ruled out. Patient had a severe lactic acidosis, appears to be secondary to severe dehydration. UA positive,start rocephin.  Patient did not meet sepsis criteria at time of admission.  Patient also has normal anion gap metabolic acidosis independent of lactic acidosis.   Blood culture negative, urine culture showed multi species.   Patient so far has completed 3 days IV Rocephin.   Conditions all resolved.     Type 2 diabetes with hypoglycemia. Hypothyroidism. TSH and cortisol level within normal limits.  Low glucose due to poor p.o. intake.  Hypoglycemia is better, patient has an appetite now.   Failure to thrive. Severe protein calorie malnutrition. Anorexia. Continue protein supplement.  Continue megestrol.  Part of the problem could be severe depression, increased dose of Remeron.  Outpatient workup with EGD and colonoscopy.   Chronic systolic congestive heart failure. Status post ICD. No  evidence of exacerbation congestive heart failure.   COPD. Chronic hypoxemic respite  failure secondary to COPD. Continues on bronchodilator, continue oxygen treatment as needed.   Seizure disorder Dementia with behavioral disturbance. History of stroke. Continue some home medicines.   Anemia of chronic disease. B12 and iron adequate.  Continue to follow.         Subjective: Patient is more awake today, but still does not have a good appetite.  No nausea vomiting or abdominal pain.  Diarrhea has resolved.  Physical Exam: Vitals:   07/20/22 1602 07/20/22 1936 07/21/22 0423 07/21/22 0905  BP: (!) 100/59 115/71 120/61 128/82  Pulse: 93 (!) 104 100 99  Resp: 15 20 16 16   Temp: 98.4 F (36.9 C) 98.9 F (37.2 C) 97.8 F (36.6 C) (!) 97.2 F (36.2 C)  TempSrc:  Oral Oral   SpO2: 92% 95% 95% 95%  Weight:       General exam: Appears calm and comfortable, severely malnourished. Respiratory system: Clear to auscultation. Respiratory effort normal. Cardiovascular system: S1 & S2 heard, RRR. No JVD, murmurs, rubs, gallops or clicks. No pedal edema. Gastrointestinal system: Abdomen is nondistended, soft and nontender. No organomegaly or masses felt. Normal bowel sounds heard. Central nervous system: Alert and oriented x2. No focal neurological deficits. Extremities: Symmetric 5 x 5 power. Skin: No rashes, lesions or ulcers Psychiatry: Mood & affect appropriate.    Data Reviewed:  Lab results reviewed.  Family Communication: None  Disposition: Status is: Inpatient Remains inpatient appropriate because: Severity of disease.     Time spent: 50 minutes  Author: Marrion Coy, MD 07/21/2022 11:44 AM  For on call review [email or web address].

## 2022-07-22 ENCOUNTER — Ambulatory Visit: Payer: Self-pay | Admitting: *Deleted

## 2022-07-22 DIAGNOSIS — G40909 Epilepsy, unspecified, not intractable, without status epilepticus: Secondary | ICD-10-CM | POA: Diagnosis not present

## 2022-07-22 DIAGNOSIS — I5022 Chronic systolic (congestive) heart failure: Secondary | ICD-10-CM | POA: Diagnosis not present

## 2022-07-22 DIAGNOSIS — R112 Nausea with vomiting, unspecified: Secondary | ICD-10-CM | POA: Diagnosis not present

## 2022-07-22 LAB — PHOSPHORUS: Phosphorus: 3.5 mg/dL (ref 2.5–4.6)

## 2022-07-22 LAB — GLUCOSE, CAPILLARY
Glucose-Capillary: 70 mg/dL (ref 70–99)
Glucose-Capillary: 76 mg/dL (ref 70–99)
Glucose-Capillary: 64 mg/dL — ABNORMAL LOW (ref 70–99)
Glucose-Capillary: 72 mg/dL (ref 70–99)
Glucose-Capillary: 95 mg/dL (ref 70–99)
Glucose-Capillary: 85 mg/dL (ref 70–99)

## 2022-07-22 LAB — COMPREHENSIVE METABOLIC PANEL
ALT: 10 U/L (ref 0–44)
AST: 21 U/L (ref 15–41)
Albumin: 1.9 g/dL — ABNORMAL LOW (ref 3.5–5.0)
Alkaline Phosphatase: 105 U/L (ref 38–126)
Anion gap: 5 (ref 5–15)
BUN: 10 mg/dL (ref 8–23)
CO2: 28 mmol/L (ref 22–32)
Calcium: 7.8 mg/dL — ABNORMAL LOW (ref 8.9–10.3)
Chloride: 108 mmol/L (ref 98–111)
Creatinine, Ser: 0.75 mg/dL (ref 0.44–1.00)
GFR, Estimated: >60 mL/min (ref >60)
Glucose, Bld: 78 mg/dL (ref 70–99)
Potassium: 4.6 mmol/L (ref 3.5–5.1)
Sodium: 141 mmol/L (ref 135–145)
Total Bilirubin: 0.4 mg/dL (ref 0.3–1.2)
Total Protein: 4.9 g/dL — ABNORMAL LOW (ref 6.5–8.1)

## 2022-07-22 LAB — CBC
HCT: 30 % — ABNORMAL LOW (ref 36.0–46.0)
Hemoglobin: 9.3 g/dL — ABNORMAL LOW (ref 12.0–15.0)
MCH: 27.1 pg (ref 26.0–34.0)
MCHC: 31 g/dL (ref 30.0–36.0)
MCV: 87.5 fL (ref 80.0–100.0)
Platelets: 207 10*3/uL (ref 150–400)
RBC: 3.43 MIL/uL — ABNORMAL LOW (ref 3.87–5.11)
RDW: 21 % — ABNORMAL HIGH (ref 11.5–15.5)
WBC: 6.4 10*3/uL (ref 4.0–10.5)
nRBC: 0 % (ref 0.0–0.2)

## 2022-07-22 LAB — MAGNESIUM: Magnesium: 1.8 mg/dL (ref 1.7–2.4)

## 2022-07-22 MED ORDER — DEXTROSE 5 % IV SOLN: INTRAVENOUS | Status: AC

## 2022-07-22 NOTE — Care Management Important Message (Signed)
Important Message  Patient Details  Name: Stacy Grimes MRN: [id number] Date of Birth: 1951/01/16   Medicare Important Message Given:  Yes     [name] 07/22/2022, 1:40 PM

## 2022-07-22 NOTE — Consult Note (Signed)
   [facility] CM Inpatient Consult   07/22/2022  Stacy Grimes 04/27/1950 [id number]  Primary Care Provider:  Dr. Terance Hart [facility])  Patient is currently active with [facility] [[facility]] Care Management for chronic disease management services.  Patient has been engage by a Airline pilot.Our community based plan of care has focused on disease management and community resource support.   Patient will receive a post hospital call and will be evaluated for assessments and disease process education.   Plan: Pending disposition  Inpatient Transition Of Care [TOC] team member to make aware that [facility] Care Management following.  Of note, [facility] Care Management services does not replace or interfere with any services that are needed or arranged by inpatient [facility] care management team.   For additional questions or referrals please contact:  Elliot Cousin, RN, BSN [facility] Liaison [place]   [facility]  Population Health Office Hours MTWF  8:00 am-6:00 pm Off on Thursday [phone] mobile [phone] [Office toll free line][facility] Office Hours are M-F 8:30 - 5 pm 24 hour nurse advise line [phone] Concierge  [email or web address]

## 2022-07-22 NOTE — Patient Outreach (Signed)
  Care Coordination   Follow Up Visit Note   07/22/2022 Name: Stacy Grimes MRN: [id number] DOB: Nov 22, 1950  Stacy Grimes is a 72 y.o. year old female who sees [name], MD for primary care.    Patient scheduled for follow up call today, noted she was readmitted to hospital on 6/11.  Hospital liaison notified, will follow up once patient is discharged home.    SDOH assessments and interventions completed:  No     Care Coordination Interventions:  Yes, provided   Follow up plan:  pending hospital discharge    Encounter Outcome:  Pt. Visit Completed   Kemper Durie, RN, MSN, [facility] Care Management Care Management Coordinator [phone]

## 2022-07-22 NOTE — Inpatient Diabetes Management (Signed)
Inpatient Diabetes Program Recommendations  AACE/ADA: New Consensus Statement on Inpatient Glycemic Control (2015)  Target Ranges:  Prepandial:   less than 140 mg/dL      Peak postprandial:   less than 180 mg/dL (1-2 hours)      Critically ill patients:  140 - 180 mg/dL   Lab Results  Component Value Date   GLUCAP 72 07/22/2022   HGBA1C 5.9 (H) 06/24/2022    Review of Glycemic Control  Diabetes history: No hx noted Outpatient Diabetes medications: None Current orders for Inpatient glycemic control: Novolog 0-6 units tid  Inpatient Diabetes Program Recommendations:   Received Diabetes Coordinator consult in regards to hypoglycemia. Patient admitted with diagnosis of failure to thrive and has been taking in supplements. Consult to dietician regarding increase to keep CBGs within normal limits.  Thank you, Stacy Grimes. Pervis Macintyre, RN, MSN, CDE  Diabetes Coordinator Inpatient Glycemic Control Team Team Pager [phone] (8am-5pm) 07/22/2022 12:22 PM

## 2022-07-22 NOTE — TOC Progression Note (Signed)
Transition of Care [facility]) - Progression Note    Patient Details  Name: Stacy Grimes MRN: [id number] Date of Birth: 01/28/1951  [facility] CM/SW Contact  Chapman Fitch, RN Phone Number: 07/22/2022, 3:31 PM  Clinical Narrative:      Notified by MD that husband would like patient to be screened for hospice services  Spoke with Mr Vossen and he confirms.  He states that he does not have a preference of hospice agency.Previous referral was made to [facility] for outpatient pallaitive.  Referral made to [facility] with [facility]  Husband confirms they have pulse ox, BP cuff, cane and RW in the home.  At this time he is not sure if patient will require a hospital bed or not        Expected Discharge Plan and Services                                               Social Determinants of Health (SDOH) Interventions SDOH Screenings   Food Insecurity: No Food Insecurity (06/10/2022)  Housing: Patient Unable To Answer (06/10/2022)  Transportation Needs: No Transportation Needs (06/10/2022)  Utilities: Not At Risk (03/07/2022)  Depression (PHQ2-9): Low Risk  (09/01/2019)  Tobacco Use: Medium Risk (06/26/2022)    Readmission Risk Interventions    07/19/2022    4:23 PM 06/28/2022    2:46 PM 04/12/2022    1:27 PM  Readmission Risk Prevention Plan  Transportation Screening Complete Complete Complete  Medication Review Oceanographer) Complete Complete Complete  PCP or Specialist appointment within 3-5 days of discharge  Complete Complete  HRI or Home Care Consult   Complete  SW Recovery Care/Counseling Consult Complete Complete Complete  Palliative Care Screening Complete Not Applicable Not Complete  Skilled Nursing Facility Not Applicable Not Applicable Not Complete

## 2022-07-22 NOTE — Progress Notes (Addendum)
Progress Note   Patient: Stacy Grimes AOZ:[id number] DOB: [birth year] DOA: 07/16/2022     6 DOS: the patient was seen and examined on 07/22/2022   Brief hospital course: TIFFIN GOODRICK is a 72 y.o. 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  Patient had minimal intake for 11 days.  Upon arriving the hospital, patient had a acute kidney injury, lactic acidosis, potassium 2.9.  Patient was also placed on Rocephin for urinary tract infection.  Was given IV fluids and replete potassium, magnesium.  6/17: Blood sugar dropped again 60s.  Started on D5 water.  Assessment and Plan:  Intractable nausea vomiting. Chronic diarrhea. Hypokalemia secondary to nausea vomiting. Hypomagnesemia secondary nausea vomiting. Hypophosphatemia. Acute kidney injury. Patient had acute kidney injury at time of admission, received a fluid bolus, renal function has normalized.  Patient also received potassium,  potassium phosphate. Stool study was negative for C. difficile and enteric pathogen.  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 However, still has very poor appetite, she has been on megestrol.  She started drinking Ensure, but p.o. still inadequate.  Discussed with the patient about NG tube, she refused it. Will continue current treatment, replete potassium and phosphate.  Husband wants to make sure, epithets to Medication continues at discharge Patient will need outpatient EGD and colonoscopy.   Severe depression. Acute metabolic encephalopathy. Seems close to her baseline now.  She does  have.  Of intermittent drowsiness Patient also admits to severe depression, she is on Remeron 15 mg daily, increased dose to 30 mg daily.  May need increased dose at discharge.    Lactic acidosis. Metabolic acidosis. Urinary tract infection. Sepsis is ruled out. Patient had a severe lactic acidosis, appears to be secondary to severe dehydration. UA positive,start rocephin.  Patient did not meet sepsis criteria at time of admission.  Patient also has normal anion gap metabolic acidosis independent of lactic acidosis.   Blood culture negative, urine culture showed multi species.   Patient so far has completed 3 days IV Rocephin.   Conditions all resolved.   Type 2 diabetes with hypoglycemia. Hypothyroidism. TSH and cortisol level within normal limits.  Low glucose due to poor p.o. intake.  Will start on D5 water as her blood sugars in 60s   Failure to thrive. Severe protein calorie malnutrition. Anorexia. Continue protein supplement.  Continue megestrol.  Part of the problem could be severe depression, increased dose of Remeron.  Outpatient workup with EGD and colonoscopy.   Chronic systolic congestive heart failure. Status post ICD. No evidence of exacerbation congestive heart failure.   COPD. Chronic hypoxemic respite failure secondary to COPD. Continues on bronchodilator, continue oxygen treatment as needed.   Seizure disorder Dementia with behavioral disturbance. History of stroke. Continue some home medicines.   Anemia of chronic disease. B12 and iron adequate.  Continue to follow.   Goals of care Husband reports having hospice evaluation in the past but he did not appreciate as they requested to deactivate defibrillator.  He still has remote monitoring for her heart failure although has not been getting any diuretics as usually she is more dehydrated than volume overload.  He does report that patient has had significant clinical  and functional decline over the last 6  months/since December.  She is totally bedbound and now requiring assistance with most of her activities of daily living except feeding but she does not have much appetite she does not eat much.  He is open to having hospice evaluation again may be with a different agency as long as they are willing to work with his conditions      Subjective: Patient had low blood sugar in 60s earlier today.  No new symptoms.  Not interested in eating.  Husband at bedside  Physical Exam: Vitals:   07/21/22 1651 07/21/22 2036 07/22/22 0259 07/22/22 0732  BP: (!) 100/59 128/61 (!) 101/59 117/71  Pulse: 89 84 73 77  Resp: 16 20 16 16   Temp: (!) 97.2 F (36.2 C) 98.4 F (36.9 C) 97.6 F (36.4 C) 98.2 F (36.8 C)  TempSrc:  Oral Oral   SpO2: 95% 96% 92% 92%  Weight:       General exam: Appears calm and comfortable, severely malnourished. Respiratory system: Clear to auscultation. Respiratory effort normal. Cardiovascular system: S1 & S2 heard, RRR. No JVD, murmurs, rubs, gallops or clicks. No pedal edema. Gastrointestinal system: Abdomen is nondistended, soft and nontender. No organomegaly or masses felt. Normal bowel sounds heard. Central nervous system: Alert and oriented x2. No focal neurological deficits. Extremities: Symmetric 5 x 5 power. Skin: No rashes, lesions or ulcers Psychiatry: Mood & affect appropriate.  Data Reviewed:  Blood sugar 64  Family Communication: Husband updated at bedside  Disposition: Status is: Inpatient Remains inpatient appropriate because: Poor p.o. intake, hypoglycemia management  Planned Discharge Destination: Home with Home Health   DVT prophylaxis-Lovenox Time spent: 35 minutes  Author: Delfino Lovett, MD 07/22/2022 1:53 PM  For on call review [email or web address].

## 2022-07-23 DIAGNOSIS — E860 Dehydration: Secondary | ICD-10-CM

## 2022-07-23 DIAGNOSIS — Z515 Encounter for palliative care: Secondary | ICD-10-CM | POA: Diagnosis not present

## 2022-07-23 DIAGNOSIS — E872 Acidosis, unspecified: Secondary | ICD-10-CM | POA: Diagnosis not present

## 2022-07-23 DIAGNOSIS — R112 Nausea with vomiting, unspecified: Secondary | ICD-10-CM | POA: Diagnosis not present

## 2022-07-23 LAB — BASIC METABOLIC PANEL
Anion gap: 7 (ref 5–15)
BUN: 9 mg/dL (ref 8–23)
CO2: 26 mmol/L (ref 22–32)
Calcium: 7.7 mg/dL — ABNORMAL LOW (ref 8.9–10.3)
Chloride: 104 mmol/L (ref 98–111)
Creatinine, Ser: 0.79 mg/dL (ref 0.44–1.00)
GFR, Estimated: >60 mL/min (ref >60)
Glucose, Bld: 82 mg/dL (ref 70–99)
Potassium: 3.9 mmol/L (ref 3.5–5.1)
Sodium: 137 mmol/L (ref 135–145)

## 2022-07-23 LAB — CBC
HCT: 28 % — ABNORMAL LOW (ref 36.0–46.0)
Hemoglobin: 8.7 g/dL — ABNORMAL LOW (ref 12.0–15.0)
MCH: 26.8 pg (ref 26.0–34.0)
MCHC: 31.1 g/dL (ref 30.0–36.0)
MCV: 86.2 fL (ref 80.0–100.0)
Platelets: 205 10*3/uL (ref 150–400)
RBC: 3.25 MIL/uL — ABNORMAL LOW (ref 3.87–5.11)
RDW: 20.6 % — ABNORMAL HIGH (ref 11.5–15.5)
WBC: 5.7 10*3/uL (ref 4.0–10.5)
nRBC: 0 % (ref 0.0–0.2)

## 2022-07-23 LAB — GLUCOSE, CAPILLARY
Glucose-Capillary: 68 mg/dL — ABNORMAL LOW (ref 70–99)
Glucose-Capillary: 72 mg/dL (ref 70–99)
Glucose-Capillary: 81 mg/dL (ref 70–99)

## 2022-07-23 MED ORDER — GLUCOSE 4 G PO CHEW: 1.0000 | CHEWABLE_TABLET | Freq: Four times a day (QID) | ORAL | 12 refills | Status: DC

## 2022-07-23 MED ORDER — QUETIAPINE FUMARATE 25 MG PO TABS: 12.5000 mg | ORAL_TABLET | Freq: Every day | ORAL | 0 refills | Status: AC

## 2022-07-23 MED ORDER — MIRTAZAPINE 30 MG PO TABS: 30.0000 mg | ORAL_TABLET | Freq: Every day | ORAL | 0 refills | Status: AC

## 2022-07-23 MED ORDER — MEGESTROL ACETATE 400 MG/10ML PO SUSP: 400.0000 mg | Freq: Every day | ORAL | 0 refills | Status: DC

## 2022-07-23 NOTE — TOC Progression Note (Addendum)
Transition of Care [facility]) - Progression Note    Patient Details  Name: Stacy Grimes MRN: [id number] Date of Birth: 19-Jan-1951  Transition of Care [facility]) CM/SW Contact  Kreg Shropshire, RN Phone Number: 07/23/2022, 11:08 AM  Clinical Narrative:     D/c order placed for pt to go home with hospice care. Cm spoke with Husband Pavani Pennoyer about correct non emergent transportation. Notified RN that pt is #1 for non emergent EMS transport        Expected Discharge Plan and Services         Expected Discharge Date: 07/23/22                           [facility] Agency:  [facility])     Representative spoke with at [facility] Agency: Home [facility]   Social Determinants of Health (SDOH) Interventions SDOH Screenings   Food Insecurity: No Food Insecurity (06/10/2022)  Housing: Patient Unable To Answer (06/10/2022)  Transportation Needs: No Transportation Needs (06/10/2022)  Utilities: Not At Risk (03/07/2022)  Depression (PHQ2-9): Low Risk  (09/01/2019)  Tobacco Use: Medium Risk (06/26/2022)    Readmission Risk Interventions    07/19/2022    4:23 PM 06/28/2022    2:46 PM 04/12/2022    1:27 PM  Readmission Risk Prevention Plan  Transportation Screening Complete Complete Complete  Medication Review [employer]) Complete Complete Complete  PCP or Specialist appointment within 3-5 days of discharge  Complete Complete  HRI or Home Care Consult   Complete  SW Recovery Care/Counseling Consult Complete Complete Complete  Palliative Care Screening Complete Not Applicable Not Complete  Skilled Nursing Facility Not Applicable Not Applicable Not Complete

## 2022-07-23 NOTE — Progress Notes (Signed)
Civil engineer, contracting [facility]) Hospital Liaison Note  Received request from Lesli Albee , Transitions of Care Manager, for hospice services at home after discharge.  Spoke with spouse to initiate education related to hospice philosophy, services, and team approach to care. Spouse verbalized understanding of information given.  Per discussion, the plan is for discharge home by EMS.  DME needs discussed.  Patient has the following equipment in the home: RW, Cane, pulse ox and BP Cuff.   Patient/family requests the following equipment in the home: AV nurse to assess for additional DME needs.  Please send signed and completed DNR home with the patient/family.  Please provide prescriptions at discharge as needed to ensure ongoing symptom management.    [facility] information and contact numbers given to spouse.   Above information shared with Bevelyn Ngo RN, Transitions of Care Manager.      Please call with any Hospice related questions or concerns.  Thank you for the opportunity to participate in this patient's care.  Redge Gainer, [facility] Liaison [phone]

## 2022-07-23 NOTE — Progress Notes (Signed)
The patient has been been discharged. IV has been removed. Report called to husband. Education completed with husband.

## 2022-07-23 NOTE — Plan of Care (Signed)
  Problem: Health Behavior/Discharge Planning: Goal: Ability to manage health-related needs will improve Outcome: Completed/Met   Problem: Clinical Measurements: Goal: Ability to maintain clinical measurements within normal limits will improve Outcome: Completed/Met Goal: Will remain free from infection Outcome: Completed/Met Goal: Diagnostic test results will improve Outcome: Completed/Met Goal: Respiratory complications will improve Outcome: Completed/Met Goal: Cardiovascular complication will be avoided Outcome: Completed/Met   Problem: Activity: Goal: Risk for activity intolerance will decrease Outcome: Completed/Met   Problem: Nutrition: Goal: Adequate nutrition will be maintained Outcome: Completed/Met   Problem: Pain Managment: Goal: General experience of comfort will improve Outcome: Completed/Met   Problem: Safety: Goal: Ability to remain free from injury will improve Outcome: Completed/Met   Problem: Fluid Volume: Goal: Ability to maintain a balanced intake and output will improve Outcome: Completed/Met   Problem: Health Behavior/Discharge Planning: Goal: Ability to identify and utilize available resources and services will improve Outcome: Completed/Met Goal: Ability to manage health-related needs will improve Outcome: Completed/Met

## 2022-07-24 NOTE — Discharge Summary (Signed)
Physician Discharge Summary   Patient: Stacy Grimes MRN: [id number] DOB: 1951-01-06  Admit date:     07/16/2022  Discharge date: 07/23/2022  Discharge Physician: Delfino Lovett   PCP: [name], MD   Recommendations at discharge:    Hospice at Home  Discharge Diagnoses: Principal Problem:   Intractable nausea and vomiting Active Problems:   Seizure disorder (HCC)   Acute metabolic encephalopathy   Hypomagnesemia   AKI (acute kidney injury) (HCC)   Hypokalemia   Chronic systolic CHF (congestive heart failure) (HCC)   Chronic obstructive pulmonary disease, unspecified COPD type (HCC)   Chronic respiratory failure with hypoxia (HCC)   Dementia with behavioral disturbance (HCC)   Protein-calorie malnutrition, severe (HCC)   Severe depression (HCC)   ICD (implantable cardioverter-defibrillator) in place   Lactic acidosis   Hypoglycemia associated with type 2 diabetes mellitus (HCC)   Failure to thrive in adult   Anorexia   History of stroke   Hypophosphatemia   Metabolic acidosis   Hospice care  Hospital Course: Stacy Grimes is a 72 y.o. 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  Patient had minimal intake for 11 days.  Upon arriving the hospital, patient had a acute kidney injury, lactic acidosis, potassium 2.9.  Patient was also placed on Rocephin for urinary tract infection.  Was given IV fluids and replete potassium, magnesium.  6/17: Blood sugar dropped again 60s.  Started on D5 water.  Assessment and Plan:  Intractable nausea vomiting. Chronic diarrhea. Hypokalemia secondary to nausea vomiting. Hypomagnesemia secondary nausea  vomiting. Hypophosphatemia. Acute kidney injury. Patient had acute kidney injury at time of admission, received a fluid bolus, renal function has normalized.  Patient also received potassium,  potassium phosphate. Stool study was negative for C. difficile and enteric pathogen.  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 However, still has very poor appetite, she has been on megestrol.  She started drinking Ensure, but p.o. still inadequate.  Discussed with the patient about NG tube, she refused it.  Husband wants to make sure appetite stimulant meds continue  at discharge   Severe depression. Acute metabolic encephalopathy. Seems close to her baseline now.  She does have.  Of intermittent drowsiness Patient also admits to severe depression, she is on Remeron 15 mg daily, increased dose to 30 mg daily.  Continue at Discharge    Lactic acidosis. Metabolic acidosis. Urinary tract infection. Sepsis is ruled out. Patient had a severe lactic acidosis, appears to be secondary to severe dehydration. UA positive,start rocephin.  Patient did not meet sepsis criteria at time of admission.  Patient also has normal anion gap metabolic acidosis independent of lactic acidosis.   Blood culture negative, urine culture showed multi species.   Patient completed treatment for UTI. Conditions all resolved.   Type 2 diabetes with hypoglycemia. Hypothyroidism. TSH and cortisol level within normal limits.  Low glucose due to poor p.o. intake.  Continue glucose tabs at discharge   Failure to thrive. Severe protein calorie malnutrition. Anorexia. Continue protein supplement.  Continue megestrol.  Part of the problem could be severe depression, increased dose of Remeron.     Chronic systolic congestive heart failure. Status post ICD. No evidence  of exacerbation congestive heart failure.   COPD. Chronic hypoxemic  respite failure secondary to COPD. Continues on bronchodilator, continue oxygen treatment as needed.   Seizure disorder Dementia with behavioral disturbance. History of stroke. Anemia of chronic disease. B12 and iron adequate.  Continue to follow.   Goals of care Husband (HCPOA) in agreement for hospice at home.       Consultants: Palliative care Disposition: Hospice care Diet recommendation:  Discharge Diet Orders (From admission, onward)     Start     Ordered   07/23/22 0000  Diet - low sodium heart healthy        07/23/22 1031           Regular diet DISCHARGE MEDICATION: Allergies as of 07/23/2022       Reactions   Codeine Palpitations   Contrast Media [iodinated Contrast Media] Rash        Medication List     STOP taking these medications    metoprolol tartrate 25 MG tablet Commonly known as: LOPRESSOR   midodrine 5 MG tablet Commonly known as: PROAMATINE   ondansetron 4 MG tablet Commonly known as: Zofran   traZODone 50 MG tablet Commonly known as: DESYREL       TAKE these medications    acetaminophen 650 MG CR tablet Commonly known as: TYLENOL Take 650 mg by mouth at bedtime.   albuterol 0.63 MG/3ML nebulizer solution Commonly known as: ACCUNEB Take 1 ampule by nebulization every 6 (six) hours as needed for wheezing.   albuterol 108 (90 Base) MCG/ACT inhaler Commonly known as: VENTOLIN HFA Inhale 2 puffs into the lungs every 6 (six) hours as needed.   allopurinol 100 MG tablet Commonly known as: ZYLOPRIM Take 0.5 tablets (50 mg total) by mouth daily.   ascorbic acid 500 MG tablet Commonly known as: VITAMIN C Take 1 tablet (500 mg total) by mouth 2 (two) times daily.   atorvastatin 80 MG tablet Commonly known as: Lipitor Take 1 tablet (80 mg total) by mouth daily.   budesonide-formoterol 160-4.5 MCG/ACT inhaler Commonly known as: SYMBICORT Inhale 2 puffs into the lungs 2 (two) times daily.   cholestyramine light 4 g  packet Commonly known as: PREVALITE Take 0.5 packets (2 g total) by mouth 3 (three) times daily.   clopidogrel 75 MG tablet Commonly known as: PLAVIX Take 1 tablet (75 mg total) by mouth daily.   donepezil 10 MG tablet Commonly known as: ARICEPT Take 10 mg by mouth at bedtime.   famotidine 20 MG tablet Commonly known as: PEPCID Take 1 tablet (20 mg total) by mouth daily.   feeding supplement Liqd Take 237 mLs by mouth 3 (three) times daily between meals.   (feeding supplement) PROSource Plus liquid Take 30 mLs by mouth 2 (two) times daily between meals.   folic acid 1 MG tablet Commonly known as: FOLVITE Take 1 tablet (1 mg total) by mouth daily.   gabapentin 100 MG capsule Commonly known as: NEURONTIN Take 3 capsules (300 mg total) by mouth 2 (two) times daily.   glucose 4 GM chewable tablet Chew 1 tablet (4 g total) by mouth 4 (four) times daily.   hydrocortisone 25 MG suppository Commonly known as: ANUSOL-HC Place 1 suppository (25 mg total) rectally 2 (two) times daily as needed for hemorrhoids or anal itching.   hydrOXYzine 25 MG capsule Commonly known as: VISTARIL Take 25 mg by mouth at bedtime as needed.   levETIRAcetam 750 MG tablet Commonly known as: KEPPRA Take 1 tablet (  750 mg total) by mouth 2 (two) times daily.   levothyroxine 88 MCG tablet Commonly known as: SYNTHROID Take 1 tablet (88 mcg total) by mouth daily at 6 (six) AM.   loperamide 2 MG capsule Commonly known as: IMODIUM Take 1 capsule (2 mg total) by mouth 4 (four) times daily as needed for diarrhea or loose stools.   megestrol 400 MG/10ML suspension Commonly known as: MEGACE Take 10 mLs (400 mg total) by mouth daily.   memantine 10 MG tablet Commonly known as: NAMENDA Take 10 mg by mouth in the morning and at bedtime.   mirtazapine 30 MG tablet Commonly known as: REMERON Take 1 tablet (30 mg total) by mouth at bedtime. What changed:  medication strength how much to take    multivitamin with minerals Tabs tablet Take 1 tablet by mouth daily with lunch.   polyvinyl alcohol 1.4 % ophthalmic solution Commonly known as: LIQUIFILM TEARS Place 1 drop into both eyes as needed for dry eyes.   QUEtiapine 25 MG tablet Commonly known as: SEROQUEL Take 0.5 tablets (12.5 mg total) by mouth at bedtime.   tiotropium 18 MCG inhalation capsule Commonly known as: SPIRIVA Place 1 capsule (18 mcg total) into inhaler and inhale daily as needed (shortness of breath).   Vitamin D (Ergocalciferol) 1.25 MG (50000 UNIT) Caps capsule Commonly known as: DRISDOL Take 1 capsule (50,000 Units total) by mouth every 7 (seven) days.               Discharge Care Instructions  (From admission, onward)           Start     Ordered   07/23/22 0000  Discharge wound care:       Comments: As above   07/23/22 1031            Follow-up Information     [name], MD. Go on 07/29/2022.   Specialty: Family Medicine Why: [facility] Discharge F/UP. You already have a appointment on 07/29/22 at 3;00pm and it will be your follow up appointment too. Contact information: [address] [place] [address] [phone]                Discharge Exam: Filed Weights   07/16/22 1248  Weight: 56.7 kg   General exam: Appears calm and comfortable, severely malnourished. Respiratory system: Clear to auscultation. Respiratory effort normal. Cardiovascular system: S1 & S2 heard, RRR. No JVD, murmurs, rubs, gallops or clicks. No pedal edema. Gastrointestinal system: Abdomen is nondistended, soft and nontender. No organomegaly or masses felt. Normal bowel sounds heard. Central nervous system: Alert and oriented x2. No focal neurological deficits. Extremities: Symmetric 5 x 5 power. Skin: No rashes, lesions or ulcers Psychiatry: Mood & affect appropriate.   Condition at discharge: poor  The results of significant diagnostics from this hospitalization (including  imaging, microbiology, ancillary and laboratory) are listed below for reference.   Imaging Studies: CT ABDOMEN PELVIS WO CONTRAST  Result Date: 07/16/2022 CLINICAL DATA:  Acute nonlocalized abdominal pain. Weakness and failure to thrive. EXAM: CT ABDOMEN AND PELVIS WITHOUT CONTRAST TECHNIQUE: Multidetector CT imaging of the abdomen and pelvis was performed following the standard protocol without IV contrast. RADIATION DOSE REDUCTION: This exam was performed according to the departmental dose-optimization program which includes automated exposure control, adjustment of the mA and/or kV according to patient size and/or use of iterative reconstruction technique. COMPARISON:  06/24/2022 FINDINGS: Lower chest: Scarring in the right lung base. Hepatobiliary: No focal liver abnormality is seen.  Status post cholecystectomy. No biliary dilatation. Pancreas: Unremarkable. No pancreatic ductal dilatation or surrounding inflammatory changes. Spleen: Normal in size without focal abnormality. Adrenals/Urinary Tract: Adrenal glands are unremarkable. Kidneys are normal, without renal calculi, focal lesion, or hydronephrosis. Bladder is unremarkable. Stomach/Bowel: Stomach, small bowel, and colon are mostly decompressed. No wall thickening or inflammatory changes are appreciated. Appendix is not identified. Vascular/Lymphatic: Aortic atherosclerosis. No enlarged abdominal or pelvic lymph nodes. Reproductive: No pelvic mass. Other: No free air or free fluid in the abdomen. Abdominal wall musculature appears intact. Musculoskeletal: Postoperative changes with internal fixation of the right hip. Degenerative changes in the spine. Anterior compression of the L1 vertebra without interval change. No acute bony abnormalities. IMPRESSION: 1. No acute process demonstrated in the abdomen or pelvis. No evidence of bowel obstruction or inflammation. 2. Aortic atherosclerosis. Electronically Signed   By: Burman Nieves M.D.   On:  07/16/2022 15:58   DG Chest Port 1 View  Result Date: 07/16/2022 CLINICAL DATA:  Question of sepsis. Evaluate for abnormality. Failure to thrive. EXAM: PORTABLE CHEST 1 VIEW COMPARISON:  06/24/2022 FINDINGS: Cardiac pacemaker. Heart size and pulmonary vascularity are normal. Lungs are clear. No pleural effusions. No pneumothorax. Mediastinal contours appear intact. Old bilateral rib fractures. Degenerative changes in the shoulders. IMPRESSION: No active disease. Electronically Signed   By: Burman Nieves M.D.   On: 07/16/2022 15:55   US RENAL  Result Date: 06/24/2022 CLINICAL DATA:  Acute kidney injury EXAM: RENAL / URINARY TRACT ULTRASOUND COMPLETE COMPARISON:  CT 06/24/2022 FINDINGS: Right Kidney: Renal measurements: 8.5 x 4.9 x 4.8 cm = volume: 104.3 mL. Echogenicity within normal limits. No mass or hydronephrosis visualized. Left Kidney: Renal measurements: 10 x 5.1 x 4.8 cm = volume: 126.6 mL. Echogenicity within normal limits. No hydronephrosis. Small cyst at the midpole measuring 14 mm, no imaging follow-up is recommended. Bladder: Appears normal for degree of bladder distention. Other: None. IMPRESSION: Essentially negative renal ultrasound. Electronically Signed   By: Jasmine Pang M.D.   On: 06/24/2022 21:44   CT ABDOMEN PELVIS WO CONTRAST  Result Date: 06/24/2022 CLINICAL DATA:  Acute abdominal pain, diarrhea EXAM: CT ABDOMEN AND PELVIS WITHOUT CONTRAST TECHNIQUE: Multidetector CT imaging of the abdomen and pelvis was performed following the standard protocol without IV contrast. RADIATION DOSE REDUCTION: This exam was performed according to the departmental dose-optimization program which includes automated exposure control, adjustment of the mA and/or kV according to patient size and/or use of iterative reconstruction technique. COMPARISON:  CT 04/11/2022 FINDINGS: Lower chest: Transvenous pacing lead to the RV apex. No pleural or pericardial effusion. Hepatobiliary: No focal liver  abnormality is seen. Status post cholecystectomy. No biliary dilatation. Pancreas: Unremarkable. No pancreatic ductal dilatation or surrounding inflammatory changes. Spleen: Normal in size without focal abnormality. Adrenals/Urinary Tract: No adrenal mass. Symmetric renal contours without urolithiasis or hydronephrosis. Urinary bladder nondistended. Stomach/Bowel: Stomach is incompletely distended, unremarkable. Small bowel decompressed. Appendix not discretely identified. The colon is incompletely distended, with a few descending diverticula. Anastomotic staple line in the proximal sigmoid segment. No acute findings. Vascular/Lymphatic: Scattered aortoiliac calcified atheromatous plaque without aneurysm. No abdominal or pelvic adenopathy. Reproductive: Status post hysterectomy. No adnexal masses. Other: Bilateral pelvic phleboliths.  No ascites.  No free air. Musculoskeletal: Stable moderate L1 vertebral compression deformity. Multilevel lumbar spondylitic change as before. Fixation hardware across right femoral IT fracture. No acute findings. IMPRESSION: 1. No acute findings. 2. Descending colon diverticulosis. 3. Stable L1 vertebral compression deformity. 4.  Aortic Atherosclerosis (ICD10-I70.0). Electronically Signed  By: Corlis Leak M.D.   On: 06/24/2022 14:54   DG Chest Port 1 View  Result Date: 06/24/2022 CLINICAL DATA:  Questionable sepsis, evaluate for abnormality. EXAM: PORTABLE CHEST 1 VIEW COMPARISON:  Radiographs 06/05/2022 and 04/11/2022.  CT 09/21/2009. FINDINGS: 1340 hours. Two views submitted. Unchanged left subclavian AICD with leads projecting to the right atrium, right ventricle and coronary sinus. The heart size and mediastinal contours are stable. The lungs are clear. There is no pleural effusion or pneumothorax. No acute osseous findings are evident. Telemetry leads overlie the chest. IMPRESSION: No evidence of acute cardiopulmonary process. Stable appearance of the AICD. Electronically  Signed   By: Carey Bullocks M.D.   On: 06/24/2022 13:54    Microbiology: Results for orders placed or performed during the hospital encounter of 07/16/22  Blood Culture (routine x 2)     Status: None   Collection Time: 07/16/22  1:22 PM   Specimen: BLOOD  Result Value Ref Range Status   Specimen Description BLOOD RIGHT ANTECUBITAL  Final   Special Requests   Final    BOTTLES DRAWN AEROBIC AND ANAEROBIC Blood Culture adequate volume   Culture   Final    NO GROWTH 5 DAYS Performed at [facility], [address] [place], [place] [address]    Report Status 07/21/2022 FINAL  Final  Resp panel by RT-PCR (RSV, Flu A&B, Covid) Anterior Nasal Swab     Status: None   Collection Time: 07/16/22  2:07 PM   Specimen: Anterior Nasal Swab  Result Value Ref Range Status   SARS Coronavirus 2 by RT PCR NEGATIVE NEGATIVE Final    Comment: (NOTE) SARS-CoV-2 target nucleic acids are NOT DETECTED.  The SARS-CoV-2 RNA is generally detectable in upper respiratory specimens during the acute phase of infection. The lowest concentration of SARS-CoV-2 viral copies this assay can detect is 138 copies/mL. A negative result does not preclude SARS-Cov-2 infection and should not be used as the sole basis for treatment or other patient management decisions. A negative result may occur with  improper specimen collection/handling, submission of specimen other than nasopharyngeal swab, presence of viral mutation(s) within the areas targeted by this assay, and inadequate number of viral copies(<138 copies/mL). A negative result must be combined with clinical observations, patient history, and epidemiological information. The expected result is Negative.  Fact Sheet for Patients:  BloggerCourse.com  Fact Sheet for Healthcare Providers:  SeriousBroker.it  This test is no t yet approved or cleared by the [place] FDA and  has been authorized for  detection and/or diagnosis of SARS-CoV-2 by FDA under an Emergency Use Authorization (EUA). This EUA will remain  in effect (meaning this test can be used) for the duration of the COVID-19 declaration under Section 564(b)(1) of the Act, 21 U.S.C.section 360bbb-3(b)(1), unless the authorization is terminated  or revoked sooner.       Influenza A by PCR NEGATIVE NEGATIVE Final   Influenza B by PCR NEGATIVE NEGATIVE Final    Comment: (NOTE) The Xpert Xpress SARS-CoV-2/FLU/RSV plus assay is intended as an aid in the diagnosis of influenza from Nasopharyngeal swab specimens and should not be used as a sole basis for treatment. Nasal washings and aspirates are unacceptable for Xpert Xpress SARS-CoV-2/FLU/RSV testing.  Fact Sheet for Patients: BloggerCourse.com  Fact Sheet for Healthcare Providers: SeriousBroker.it  This test is not yet approved or cleared by the [place] FDA and has been authorized for detection and/or diagnosis of SARS-CoV-2 by FDA under an Emergency Use  Authorization (EUA). This EUA will remain in effect (meaning this test can be used) for the duration of the COVID-19 declaration under Section 564(b)(1) of the Act, 21 U.S.C. section 360bbb-3(b)(1), unless the authorization is terminated or revoked.     Resp Syncytial Virus by PCR NEGATIVE NEGATIVE Final    Comment: (NOTE) Fact Sheet for Patients: BloggerCourse.com  Fact Sheet for Healthcare Providers: SeriousBroker.it  This test is not yet approved or cleared by the [place] FDA and has been authorized for detection and/or diagnosis of SARS-CoV-2 by FDA under an Emergency Use Authorization (EUA). This EUA will remain in effect (meaning this test can be used) for the duration of the COVID-19 declaration under Section 564(b)(1) of the Act, 21 U.S.C. section 360bbb-3(b)(1), unless the authorization is  terminated or revoked.  Performed at [facility], [address] [place], [place] [address]   Blood Culture (routine x 2)     Status: None   Collection Time: 07/16/22  2:07 PM   Specimen: BLOOD  Result Value Ref Range Status   Specimen Description BLOOD LEFT ANTECUBITAL  Final   Special Requests   Final    BOTTLES DRAWN AEROBIC AND ANAEROBIC Blood Culture adequate volume   Culture   Final    NO GROWTH 5 DAYS Performed at [facility], [address] [place], [place] [address]    Report Status 07/21/2022 FINAL  Final  Urine Culture (for pregnant, neutropenic or urologic patients or patients with an indwelling urinary catheter)     Status: Abnormal   Collection Time: 07/16/22  4:45 PM   Specimen: Urine, Random  Result Value Ref Range Status   Specimen Description   Final    URINE, RANDOM Performed at [facility], [address], [place], [place] [address]    Special Requests   Final    NONE Performed at [facility], [address] [place], [place] [address]    Culture MULTIPLE SPECIES PRESENT, SUGGEST RECOLLECTION (A)  Final   Report Status 07/17/2022 FINAL  Final  C Difficile Quick Screen w PCR reflex     Status: None   Collection Time: 07/17/22 11:58 PM   Specimen: STOOL  Result Value Ref Range Status   C Diff antigen NEGATIVE NEGATIVE Final   C Diff toxin NEGATIVE NEGATIVE Final   C Diff interpretation No C. difficile detected.  Final    Comment: Performed at [facility], [address] [place], [place] [address]  Gastrointestinal Panel by PCR , Stool     Status: None   Collection Time: 07/17/22 11:58 PM   Specimen: STOOL  Result Value Ref Range Status   Campylobacter species NOT DETECTED NOT DETECTED Final   Plesimonas shigelloides NOT DETECTED NOT DETECTED Final   Salmonella species NOT DETECTED NOT DETECTED Final   Yersinia enterocolitica NOT DETECTED NOT DETECTED Final   Vibrio species NOT DETECTED NOT  DETECTED Final   Vibrio cholerae NOT DETECTED NOT DETECTED Final   Enteroaggregative E coli (EAEC) NOT DETECTED NOT DETECTED Final   Enteropathogenic E coli (EPEC) NOT DETECTED NOT DETECTED Final   Enterotoxigenic E coli (ETEC) NOT DETECTED NOT DETECTED Final   Shiga like toxin producing E coli (STEC) NOT DETECTED NOT DETECTED Final   Shigella/Enteroinvasive E coli (EIEC) NOT DETECTED NOT DETECTED Final   Cryptosporidium NOT DETECTED NOT DETECTED Final   Cyclospora cayetanensis NOT DETECTED NOT DETECTED Final   Entamoeba histolytica NOT DETECTED NOT DETECTED Final   Giardia lamblia NOT DETECTED  NOT DETECTED Final   Adenovirus F40/41 NOT DETECTED NOT DETECTED Final   Astrovirus NOT DETECTED NOT DETECTED Final   Norovirus GI/GII NOT DETECTED NOT DETECTED Final   Rotavirus A NOT DETECTED NOT DETECTED Final   Sapovirus (I, II, IV, and V) NOT DETECTED NOT DETECTED Final    Comment: Performed at [facility], [address] [place], [place] [address]    Labs: CBC: Recent Labs  Lab 07/18/22 0809 07/19/22 0519 07/20/22 0506 07/22/22 0449 07/23/22 0408  WBC 5.1 7.0 6.1 6.4 5.7  HGB 10.5* 10.3* 9.2* 9.3* 8.7*  HCT 34.9* 33.2* 29.3* 30.0* 28.0*  MCV 91.4 88.5 87.2 87.5 86.2  PLT 242 252 209 207 205   Basic Metabolic Panel: Recent Labs  Lab 07/18/22 0809 07/19/22 0519 07/20/22 0506 07/21/22 0502 07/22/22 0449 07/23/22 0408  NA 144 140 140 140 141 137  K 4.6 4.0 3.5 3.2* 4.6 3.9  CL 117* 116* 108 104 108 104  CO2 17* 16* 26 29 28 26   GLUCOSE 75 88 69* 100* 78 82  BUN <5* 5* 8 10 10 9   CREATININE 0.97 1.00 0.88 0.80 0.75 0.79  CALCIUM 7.9* 8.1* 7.6* 7.6* 7.8* 7.7*  MG 2.0 1.8 1.8 1.7 1.8  --   PHOS 3.8 3.0  --  2.4* 3.5  --    Liver Function Tests: Recent Labs  Lab 07/22/22 0449  AST 21  ALT 10  ALKPHOS 105  BILITOT 0.4  PROT 4.9*  ALBUMIN 1.9*   CBG: Recent Labs  Lab 07/22/22 1636 07/22/22 2150 07/23/22 0848 07/23/22 1017 07/23/22 1224  GLUCAP 95  85 68* 72 81    Discharge time spent: greater than 30 minutes.  Signed: Delfino Lovett, MD [facility] 07/24/2022

## 2022-07-25 ENCOUNTER — Telehealth: Payer: Self-pay | Admitting: *Deleted

## 2022-07-25 NOTE — Patient Outreach (Signed)
  Care Coordination   Follow Up Visit Note   07/25/2022 Name: ALVAH DUTKIEWICZ MRN: [id number] DOB: January 13, 1951  Lewanda Rife is a 72 y.o. year old female who sees [name], MD for primary care. I spoke with [name], husband of CRETA BARTCH by phone today.  What matters to the patients health and wellness today?  Discharged home with hospice, will close case    Goals Addressed             This Visit's Progress    COMPLETED: Obtain new DME company   On track    Care Coordination Interventions: Basic overview and discussion of pathophysiology of Heart Failure reviewed Provided education on low sodium diet Discussed the importance of keeping all appointments with provider Screening for signs and symptoms of depression related to chronic disease state  Assessed social determinant of health barriers  Provided patient with basic written and verbal COPD education on self care/management/and exacerbation prevention Provided written and verbal instructions on pursed lip breathing and utilized returned demonstration as teach back Provided instruction about proper use of medications used for management of COPD including inhalers      COMPLETED: Recover from hospital admission   Not on track      Interventions Today    Flowsheet Row Most Recent Value  Chronic Disease   Chronic disease during today's visit Chronic Kidney Disease/End Stage Renal Disease (ESRD)  General Interventions   General Interventions Discussed/Reviewed General Interventions Reviewed, [employer] home with hospice, confirmed initial home visit has taken place.  husband state they are in need of ramp, advised to contact hospice agency for resources]             SDOH assessments and interventions completed:  No     Care Coordination Interventions:  Yes, provided   Follow up plan: No further intervention required.   Encounter Outcome:  Pt. Visit Completed   Kemper Durie, RN,  MSN, [facility] Care Management Care Management Coordinator [phone]

## 2022-07-29 ENCOUNTER — Ambulatory Visit: Payer: Medicare HMO | Attending: Internal Medicine

## 2022-07-29 DIAGNOSIS — Z95810 Presence of automatic (implantable) cardiac defibrillator: Secondary | ICD-10-CM

## 2022-07-29 DIAGNOSIS — I5022 Chronic systolic (congestive) heart failure: Secondary | ICD-10-CM

## 2022-07-31 NOTE — Progress Notes (Signed)
EPIC Encounter for ICM Monitoring  Patient Name: Stacy Grimes is a 72 y.o. female Date: 07/31/2022 Primary Care Physican: [name], MD Primary Cardiologist: Kirke Corin Electrophysiologist: Joycelyn Schmid Pacing:  98%  10/12/2020 Office Weight: 184 lbs 03/05/2022 Weight:  Unable to weigh at home   AT/AF Burden <1% (taking Aspirin & Plavix)   Transmission results reviewed.     Coruve thoracic impedance suggesting normal fluid levels since 6/3.    Prescribed:  No diuretics   Labs: 07/23/2022 Creatinine 0.79, BUN   9, Potassium 3.9, Sodium 137, GFR >60  07/22/2022 Creatinine 0.75, BUN 10, Potassium 4.6, Sodium 141, GFR >60  07/21/2022 Creatinine 0.80, BUN 10, Potassium 3.2, Sodium 140, GFR >60  07/20/2022 Creatinine 0.88, BUN   8, Potassium 3.5, Sodium 140, GFR >60 07/19/2022 Creatinine 1.00, BUN   5, Potassium 4.0, Sodium 140, GFR 59  07/18/2022 Creatinine 0.97, BUN <5, Potassium 4.6, Sodium 144, GFR >60  07/17/2022 Creatinine 0.93, BUN <5, Potassium 3.1, Sodium 144, GFR >60  07/16/2022 Creatinine 1.16, BUN <5, Potassium 2.9, Sodium 140, GFR 50  A complete set of results can be found in Results Review.   Recommendations:  No changes.   Follow-up plan: ICM clinic phone appointment on 08/19/2022.  91 day device clinic remote transmission scheduled 10/03/2022.     EP/Cardiology Office Visits:   Canceled 03/08/2022 with Dr Kirke Corin.     Last OV with Dr Graciela Husbands was 07/16/2017.    Copy of ICM check sent to Dr. Graciela Husbands.      3 month ICM trend: 07/29/2022.    12-14 Month ICM trend:     Karie Soda, RN 07/31/2022 10:36 AM

## 2022-08-10 ENCOUNTER — Other Ambulatory Visit: Payer: Self-pay | Admitting: Internal Medicine

## 2022-08-10 MED ORDER — LEVETIRACETAM 750 MG PO TABS: 750.0000 mg | ORAL_TABLET | Freq: Two times a day (BID) | ORAL | 3 refills | Status: DC

## 2022-08-19 ENCOUNTER — Ambulatory Visit: Payer: Medicare HMO | Attending: Internal Medicine

## 2022-08-19 DIAGNOSIS — I5022 Chronic systolic (congestive) heart failure: Secondary | ICD-10-CM | POA: Diagnosis not present

## 2022-08-19 DIAGNOSIS — Z95810 Presence of automatic (implantable) cardiac defibrillator: Secondary | ICD-10-CM | POA: Diagnosis not present

## 2022-08-20 DIAGNOSIS — R197 Diarrhea, unspecified: Secondary | ICD-10-CM | POA: Diagnosis not present

## 2022-08-20 DIAGNOSIS — I69818 Other symptoms and signs involving cognitive functions following other cerebrovascular disease: Secondary | ICD-10-CM | POA: Diagnosis not present

## 2022-08-20 NOTE — Progress Notes (Signed)
EPIC Encounter for ICM Monitoring  Patient Name: Stacy Grimes is a 72 y.o. female Date: 08/20/2022 Primary Care Physican: [name], MD Primary Cardiologist: Kirke Corin Electrophysiologist: Joycelyn Schmid Pacing:  98%  10/12/2020 Office Weight: 184 lbs 03/05/2022 Weight:  Unable to weigh at home   AT/AF Burden <1% (taking Aspirin & Plavix)   Spoke with husband and heart failure questions reviewed.  Transmission results reviewed.  Pt is sleeping a lot.  He reports she has hospice coming to the home.  He asked if the defibrillator will shock her when she passes.  Advised will send message to device clinic regarding his concerns.       Coruve thoracic impedance suggesting normal fluid levels since 7/3 but trending close to baseline.    Prescribed:  No diuretics   Labs: 07/23/2022 Creatinine 0.79, BUN   9, Potassium 3.9, Sodium 137, GFR >60  07/22/2022 Creatinine 0.75, BUN 10, Potassium 4.6, Sodium 141, GFR >60  07/21/2022 Creatinine 0.80, BUN 10, Potassium 3.2, Sodium 140, GFR >60  07/20/2022 Creatinine 0.88, BUN   8, Potassium 3.5, Sodium 140, GFR >60 07/19/2022 Creatinine 1.00, BUN   5, Potassium 4.0, Sodium 140, GFR 59  07/18/2022 Creatinine 0.97, BUN <5, Potassium 4.6, Sodium 144, GFR >60  07/17/2022 Creatinine 0.93, BUN <5, Potassium 3.1, Sodium 144, GFR >60  07/16/2022 Creatinine 1.16, BUN <5, Potassium 2.9, Sodium 140, GFR 50  A complete set of results can be found in Results Review.   Recommendations:  No changes at this time.   Follow-up plan: ICM clinic phone appointment on 09/02/2022 to recheck fluid levels.  91 day device clinic remote transmission scheduled 10/03/2022.     EP/Cardiology Office Visits:   None    Copy of ICM check sent to Dr. Graciela Husbands.     3 month ICM trend: 08/19/2022.    12-14 Month ICM trend:     Karie Soda, RN 08/20/2022 8:51 AM

## 2022-09-02 ENCOUNTER — Ambulatory Visit: Payer: Medicare HMO

## 2022-09-02 DIAGNOSIS — Z95810 Presence of automatic (implantable) cardiac defibrillator: Secondary | ICD-10-CM

## 2022-09-02 DIAGNOSIS — I5022 Chronic systolic (congestive) heart failure: Secondary | ICD-10-CM

## 2022-09-02 NOTE — Progress Notes (Signed)
EPIC Encounter for ICM Monitoring  Patient Name: Stacy Grimes is a 72 y.o. female Date: 09/02/2022 Primary Care Physican: [name], MD Primary Cardiologist: Kirke Corin Electrophysiologist: Joycelyn Schmid Pacing:  98%  10/12/2020 Office Weight: 184 lbs 03/05/2022 Weight:  Unable to weigh at home   AT/AF Burden <1% (taking Aspirin & Plavix)   Spoke with husband and heart failure questions reviewed.  Transmission results reviewed.   He reports pt has hospice coming to the home.     Coruve thoracic impedance suggesting normal fluid levels.    Prescribed:  No diuretics   Labs: 07/23/2022 Creatinine 0.79, BUN   9, Potassium 3.9, Sodium 137, GFR >60  07/22/2022 Creatinine 0.75, BUN 10, Potassium 4.6, Sodium 141, GFR >60  07/21/2022 Creatinine 0.80, BUN 10, Potassium 3.2, Sodium 140, GFR >60  07/20/2022 Creatinine 0.88, BUN   8, Potassium 3.5, Sodium 140, GFR >60 07/19/2022 Creatinine 1.00, BUN   5, Potassium 4.0, Sodium 140, GFR 59  07/18/2022 Creatinine 0.97, BUN <5, Potassium 4.6, Sodium 144, GFR >60  07/17/2022 Creatinine 0.93, BUN <5, Potassium 3.1, Sodium 144, GFR >60  07/16/2022 Creatinine 1.16, BUN <5, Potassium 2.9, Sodium 140, GFR 50  A complete set of results can be found in Results Review.   Recommendations:   Husband would like to continue ICM monthly monitor even though patient is in hospice.    Follow-up plan: ICM clinic phone appointment on 09/23/2022.  91 day device clinic remote transmission scheduled 10/03/2022.     EP/Cardiology Office Visits:  None    Copy of ICM check sent to Dr. Graciela Husbands.     3 month ICM trend: 09/02/2022.    12-14 Month ICM trend:     Karie Soda, RN 09/02/2022 1:00 PM

## 2022-09-04 ENCOUNTER — Telehealth: Payer: Self-pay

## 2022-09-04 NOTE — Telephone Encounter (Signed)
Returned call to husband as requested by voice mail message.  He reports patient passed away yesterday.  He wanted to let me know and requested to send message to Dr Graciela Husbands a message as well.  He appreciated all the assistance with taking care of her.   Message sent to Dr Graciela Husbands as requested.

## 2022-09-05 DEATH — deceased

## 2022-09-25 ENCOUNTER — Other Ambulatory Visit (HOSPITAL_COMMUNITY): Payer: Self-pay

## 2022-10-03 ENCOUNTER — Ambulatory Visit: Payer: Medicare HMO

## 2022-10-08 ENCOUNTER — Ambulatory Visit: Payer: Medicare HMO

## 2023-01-07 ENCOUNTER — Ambulatory Visit: Payer: Medicare HMO

## 2023-04-08 ENCOUNTER — Ambulatory Visit: Payer: Medicare HMO

## 2023-07-08 ENCOUNTER — Ambulatory Visit: Payer: Medicare HMO

## 2023-10-07 ENCOUNTER — Ambulatory Visit: Payer: Medicare HMO

## 2024-01-06 ENCOUNTER — Ambulatory Visit: Payer: Medicare HMO
